# Patient Record
Sex: Female | Born: 1975
Health system: Southern US, Community
[De-identification: ages and names within clinical notes are randomized; demographics above are authoritative.]

## PROBLEM LIST (undated history)

## (undated) DIAGNOSIS — Z90710 Acquired absence of both cervix and uterus: Secondary | ICD-10-CM

## (undated) DIAGNOSIS — M1990 Unspecified osteoarthritis, unspecified site: Secondary | ICD-10-CM

## (undated) DIAGNOSIS — J189 Pneumonia, unspecified organism: Secondary | ICD-10-CM

## (undated) DIAGNOSIS — M549 Dorsalgia, unspecified: Secondary | ICD-10-CM

## (undated) DIAGNOSIS — K760 Fatty (change of) liver, not elsewhere classified: Secondary | ICD-10-CM

## (undated) DIAGNOSIS — G4700 Insomnia, unspecified: Secondary | ICD-10-CM

## (undated) DIAGNOSIS — L509 Urticaria, unspecified: Secondary | ICD-10-CM

## (undated) DIAGNOSIS — T783XXA Angioneurotic edema, initial encounter: Secondary | ICD-10-CM

## (undated) DIAGNOSIS — K219 Gastro-esophageal reflux disease without esophagitis: Secondary | ICD-10-CM

## (undated) DIAGNOSIS — M810 Age-related osteoporosis without current pathological fracture: Secondary | ICD-10-CM

## (undated) DIAGNOSIS — D649 Anemia, unspecified: Secondary | ICD-10-CM

## (undated) DIAGNOSIS — E739 Lactose intolerance, unspecified: Secondary | ICD-10-CM

## (undated) DIAGNOSIS — M2550 Pain in unspecified joint: Secondary | ICD-10-CM

## (undated) DIAGNOSIS — F419 Anxiety disorder, unspecified: Secondary | ICD-10-CM

## (undated) DIAGNOSIS — E559 Vitamin D deficiency, unspecified: Secondary | ICD-10-CM

## (undated) DIAGNOSIS — Z8742 Personal history of other diseases of the female genital tract: Secondary | ICD-10-CM

## (undated) DIAGNOSIS — N62 Hypertrophy of breast: Secondary | ICD-10-CM

## (undated) DIAGNOSIS — J45909 Unspecified asthma, uncomplicated: Secondary | ICD-10-CM

## (undated) DIAGNOSIS — G43909 Migraine, unspecified, not intractable, without status migrainosus: Secondary | ICD-10-CM

## (undated) DIAGNOSIS — T7840XA Allergy, unspecified, initial encounter: Secondary | ICD-10-CM

## (undated) DIAGNOSIS — R7303 Prediabetes: Secondary | ICD-10-CM

## (undated) HISTORY — DX: Fatty (change of) liver, not elsewhere classified: K76.0

## (undated) HISTORY — DX: Insomnia, unspecified: G47.00

## (undated) HISTORY — DX: Vitamin D deficiency, unspecified: E55.9

## (undated) HISTORY — DX: Dorsalgia, unspecified: M54.9

## (undated) HISTORY — DX: Angioneurotic edema, initial encounter: T78.3XXA

## (undated) HISTORY — DX: Unspecified osteoarthritis, unspecified site: M19.90

## (undated) HISTORY — DX: Unspecified asthma, uncomplicated: J45.909

## (undated) HISTORY — DX: Anxiety disorder, unspecified: F41.9

## (undated) HISTORY — DX: Acquired absence of both cervix and uterus: Z90.710

## (undated) HISTORY — DX: Age-related osteoporosis without current pathological fracture: M81.0

## (undated) HISTORY — DX: Allergy, unspecified, initial encounter: T78.40XA

## (undated) HISTORY — PX: UPPER GASTROINTESTINAL ENDOSCOPY: SHX188

## (undated) HISTORY — PX: RIGHT OOPHORECTOMY: SHX2359

## (undated) HISTORY — DX: Lactose intolerance, unspecified: E73.9

## (undated) HISTORY — PX: CHOLECYSTECTOMY: SHX55

## (undated) HISTORY — DX: Pain in unspecified joint: M25.50

## (undated) HISTORY — DX: Urticaria, unspecified: L50.9

## (undated) HISTORY — PX: TONSILLECTOMY: SUR1361

---

## 1999-09-04 ENCOUNTER — Emergency Department (HOSPITAL_COMMUNITY): Admission: EM | Admit: 1999-09-04 | Discharge: 1999-09-04 | Payer: Self-pay | Admitting: Emergency Medicine

## 2000-01-14 ENCOUNTER — Emergency Department (HOSPITAL_COMMUNITY): Admission: EM | Admit: 2000-01-14 | Discharge: 2000-01-14 | Payer: Self-pay

## 2000-02-15 ENCOUNTER — Emergency Department (HOSPITAL_COMMUNITY): Admission: EM | Admit: 2000-02-15 | Discharge: 2000-02-15 | Payer: Self-pay

## 2000-05-13 ENCOUNTER — Emergency Department (HOSPITAL_COMMUNITY): Admission: EM | Admit: 2000-05-13 | Discharge: 2000-05-13 | Payer: Self-pay | Admitting: Emergency Medicine

## 2000-07-10 ENCOUNTER — Inpatient Hospital Stay (HOSPITAL_COMMUNITY): Admission: AD | Admit: 2000-07-10 | Discharge: 2000-07-10 | Payer: Self-pay | Admitting: *Deleted

## 2000-07-14 ENCOUNTER — Ambulatory Visit (HOSPITAL_COMMUNITY): Admission: RE | Admit: 2000-07-14 | Discharge: 2000-07-14 | Payer: Self-pay | Admitting: *Deleted

## 2000-07-14 ENCOUNTER — Encounter: Payer: Self-pay | Admitting: *Deleted

## 2000-07-21 ENCOUNTER — Emergency Department (HOSPITAL_COMMUNITY): Admission: EM | Admit: 2000-07-21 | Discharge: 2000-07-21 | Payer: Self-pay | Admitting: Emergency Medicine

## 2000-09-26 ENCOUNTER — Ambulatory Visit (HOSPITAL_COMMUNITY): Admission: RE | Admit: 2000-09-26 | Discharge: 2000-09-26 | Payer: Self-pay | Admitting: *Deleted

## 2000-09-26 ENCOUNTER — Encounter: Payer: Self-pay | Admitting: *Deleted

## 2000-11-17 ENCOUNTER — Inpatient Hospital Stay (HOSPITAL_COMMUNITY): Admission: AD | Admit: 2000-11-17 | Discharge: 2000-11-17 | Payer: Self-pay | Admitting: Obstetrics and Gynecology

## 2000-12-29 ENCOUNTER — Encounter (INDEPENDENT_AMBULATORY_CARE_PROVIDER_SITE_OTHER): Payer: Self-pay

## 2000-12-29 ENCOUNTER — Inpatient Hospital Stay (HOSPITAL_COMMUNITY): Admission: AD | Admit: 2000-12-29 | Discharge: 2001-01-01 | Payer: Self-pay | Admitting: Obstetrics and Gynecology

## 2000-12-29 HISTORY — PX: TUBAL LIGATION: SHX77

## 2001-07-16 ENCOUNTER — Other Ambulatory Visit: Admission: RE | Admit: 2001-07-16 | Discharge: 2001-07-16 | Payer: Self-pay | Admitting: Obstetrics and Gynecology

## 2001-08-14 ENCOUNTER — Ambulatory Visit (HOSPITAL_BASED_OUTPATIENT_CLINIC_OR_DEPARTMENT_OTHER): Admission: RE | Admit: 2001-08-14 | Discharge: 2001-08-14 | Payer: Self-pay | Admitting: Family Medicine

## 2001-08-16 ENCOUNTER — Emergency Department (HOSPITAL_COMMUNITY): Admission: EM | Admit: 2001-08-16 | Discharge: 2001-08-16 | Payer: Self-pay | Admitting: Emergency Medicine

## 2001-10-27 ENCOUNTER — Ambulatory Visit (HOSPITAL_COMMUNITY): Admission: RE | Admit: 2001-10-27 | Discharge: 2001-10-27 | Payer: Self-pay | Admitting: Family Medicine

## 2002-01-28 ENCOUNTER — Encounter: Payer: Self-pay | Admitting: Family Medicine

## 2002-01-28 ENCOUNTER — Ambulatory Visit (HOSPITAL_COMMUNITY): Admission: RE | Admit: 2002-01-28 | Discharge: 2002-01-28 | Payer: Self-pay | Admitting: Family Medicine

## 2002-02-18 ENCOUNTER — Emergency Department (HOSPITAL_COMMUNITY): Admission: EM | Admit: 2002-02-18 | Discharge: 2002-02-19 | Payer: Self-pay | Admitting: Emergency Medicine

## 2002-03-29 ENCOUNTER — Emergency Department (HOSPITAL_COMMUNITY): Admission: EM | Admit: 2002-03-29 | Discharge: 2002-03-29 | Payer: Self-pay | Admitting: Emergency Medicine

## 2002-07-03 ENCOUNTER — Encounter: Payer: Self-pay | Admitting: Emergency Medicine

## 2002-07-03 ENCOUNTER — Emergency Department (HOSPITAL_COMMUNITY): Admission: EM | Admit: 2002-07-03 | Discharge: 2002-07-03 | Payer: Self-pay | Admitting: Emergency Medicine

## 2002-11-02 ENCOUNTER — Other Ambulatory Visit: Admission: RE | Admit: 2002-11-02 | Discharge: 2002-11-02 | Payer: Self-pay | Admitting: Internal Medicine

## 2002-12-28 ENCOUNTER — Emergency Department (HOSPITAL_COMMUNITY): Admission: EM | Admit: 2002-12-28 | Discharge: 2002-12-28 | Payer: Self-pay | Admitting: Emergency Medicine

## 2003-01-04 ENCOUNTER — Encounter: Admission: RE | Admit: 2003-01-04 | Discharge: 2003-01-04 | Payer: Self-pay | Admitting: Obstetrics and Gynecology

## 2003-01-07 ENCOUNTER — Ambulatory Visit (HOSPITAL_COMMUNITY): Admission: RE | Admit: 2003-01-07 | Discharge: 2003-01-07 | Payer: Self-pay | Admitting: Obstetrics and Gynecology

## 2003-01-07 ENCOUNTER — Encounter: Payer: Self-pay | Admitting: Obstetrics and Gynecology

## 2003-02-17 ENCOUNTER — Ambulatory Visit (HOSPITAL_COMMUNITY): Admission: RE | Admit: 2003-02-17 | Discharge: 2003-02-17 | Payer: Self-pay | Admitting: Internal Medicine

## 2003-02-18 ENCOUNTER — Emergency Department (HOSPITAL_COMMUNITY): Admission: EM | Admit: 2003-02-18 | Discharge: 2003-02-18 | Payer: Self-pay | Admitting: Emergency Medicine

## 2003-02-25 ENCOUNTER — Encounter: Payer: Self-pay | Admitting: Family Medicine

## 2003-02-25 ENCOUNTER — Ambulatory Visit (HOSPITAL_COMMUNITY): Admission: RE | Admit: 2003-02-25 | Discharge: 2003-02-25 | Payer: Self-pay | Admitting: Family Medicine

## 2003-03-08 ENCOUNTER — Encounter: Admission: RE | Admit: 2003-03-08 | Discharge: 2003-03-08 | Payer: Self-pay | Admitting: Obstetrics and Gynecology

## 2003-05-10 ENCOUNTER — Encounter: Admission: RE | Admit: 2003-05-10 | Discharge: 2003-05-10 | Payer: Self-pay | Admitting: Obstetrics and Gynecology

## 2003-06-13 ENCOUNTER — Encounter (INDEPENDENT_AMBULATORY_CARE_PROVIDER_SITE_OTHER): Payer: Self-pay | Admitting: *Deleted

## 2003-06-13 ENCOUNTER — Ambulatory Visit (HOSPITAL_COMMUNITY): Admission: RE | Admit: 2003-06-13 | Discharge: 2003-06-13 | Payer: Self-pay | Admitting: Obstetrics and Gynecology

## 2003-06-15 ENCOUNTER — Inpatient Hospital Stay (HOSPITAL_COMMUNITY): Admission: AD | Admit: 2003-06-15 | Discharge: 2003-06-15 | Payer: Self-pay | Admitting: Obstetrics and Gynecology

## 2003-06-19 ENCOUNTER — Inpatient Hospital Stay (HOSPITAL_COMMUNITY): Admission: AD | Admit: 2003-06-19 | Discharge: 2003-06-19 | Payer: Self-pay | Admitting: *Deleted

## 2003-06-20 ENCOUNTER — Inpatient Hospital Stay (HOSPITAL_COMMUNITY): Admission: AD | Admit: 2003-06-20 | Discharge: 2003-06-20 | Payer: Self-pay | Admitting: Obstetrics and Gynecology

## 2003-06-22 ENCOUNTER — Emergency Department (HOSPITAL_COMMUNITY): Admission: EM | Admit: 2003-06-22 | Discharge: 2003-06-22 | Payer: Self-pay | Admitting: Emergency Medicine

## 2003-06-29 ENCOUNTER — Inpatient Hospital Stay (HOSPITAL_COMMUNITY): Admission: AD | Admit: 2003-06-29 | Discharge: 2003-06-29 | Payer: Self-pay | Admitting: Obstetrics and Gynecology

## 2003-07-06 ENCOUNTER — Inpatient Hospital Stay (HOSPITAL_COMMUNITY): Admission: AD | Admit: 2003-07-06 | Discharge: 2003-07-06 | Payer: Self-pay | Admitting: Obstetrics and Gynecology

## 2003-07-08 HISTORY — PX: DILATION AND CURETTAGE OF UTERUS: SHX78

## 2003-07-21 ENCOUNTER — Encounter: Admission: RE | Admit: 2003-07-21 | Discharge: 2003-07-21 | Payer: Self-pay | Admitting: Obstetrics and Gynecology

## 2003-11-18 ENCOUNTER — Ambulatory Visit: Admission: RE | Admit: 2003-11-18 | Discharge: 2003-11-18 | Payer: Self-pay | Admitting: Obstetrics and Gynecology

## 2003-12-12 ENCOUNTER — Ambulatory Visit: Payer: Self-pay | Admitting: Obstetrics and Gynecology

## 2003-12-12 ENCOUNTER — Inpatient Hospital Stay (HOSPITAL_COMMUNITY): Admission: RE | Admit: 2003-12-12 | Discharge: 2003-12-15 | Payer: Self-pay | Admitting: Obstetrics and Gynecology

## 2003-12-12 ENCOUNTER — Encounter (INDEPENDENT_AMBULATORY_CARE_PROVIDER_SITE_OTHER): Payer: Self-pay | Admitting: Specialist

## 2003-12-12 HISTORY — PX: ABDOMINAL HYSTERECTOMY: SHX81

## 2003-12-16 ENCOUNTER — Inpatient Hospital Stay (HOSPITAL_COMMUNITY): Admission: AD | Admit: 2003-12-16 | Discharge: 2003-12-19 | Payer: Self-pay | Admitting: Obstetrics and Gynecology

## 2003-12-16 ENCOUNTER — Ambulatory Visit: Payer: Self-pay | Admitting: Obstetrics and Gynecology

## 2003-12-26 ENCOUNTER — Inpatient Hospital Stay (HOSPITAL_COMMUNITY): Admission: AD | Admit: 2003-12-26 | Discharge: 2003-12-26 | Payer: Self-pay | Admitting: Obstetrics and Gynecology

## 2003-12-26 ENCOUNTER — Ambulatory Visit: Payer: Self-pay | Admitting: Obstetrics and Gynecology

## 2004-01-02 ENCOUNTER — Inpatient Hospital Stay (HOSPITAL_COMMUNITY): Admission: AD | Admit: 2004-01-02 | Discharge: 2004-01-02 | Payer: Self-pay | Admitting: Obstetrics and Gynecology

## 2004-01-02 ENCOUNTER — Ambulatory Visit: Payer: Self-pay | Admitting: Obstetrics and Gynecology

## 2004-01-06 ENCOUNTER — Ambulatory Visit: Payer: Self-pay | Admitting: Family Medicine

## 2004-01-09 ENCOUNTER — Ambulatory Visit: Payer: Self-pay | Admitting: Family Medicine

## 2004-01-09 ENCOUNTER — Inpatient Hospital Stay (HOSPITAL_COMMUNITY): Admission: AD | Admit: 2004-01-09 | Discharge: 2004-01-09 | Payer: Self-pay | Admitting: *Deleted

## 2004-01-11 ENCOUNTER — Ambulatory Visit (HOSPITAL_COMMUNITY): Payer: Self-pay | Admitting: Professional Counselor

## 2004-01-16 ENCOUNTER — Inpatient Hospital Stay (HOSPITAL_COMMUNITY): Admission: AD | Admit: 2004-01-16 | Discharge: 2004-01-16 | Payer: Self-pay | Admitting: Obstetrics and Gynecology

## 2004-01-23 ENCOUNTER — Ambulatory Visit (HOSPITAL_COMMUNITY): Payer: Self-pay | Admitting: Professional Counselor

## 2004-01-23 ENCOUNTER — Inpatient Hospital Stay (HOSPITAL_COMMUNITY): Admission: AD | Admit: 2004-01-23 | Discharge: 2004-01-23 | Payer: Self-pay | Admitting: Obstetrics and Gynecology

## 2004-01-24 ENCOUNTER — Ambulatory Visit: Payer: Self-pay | Admitting: Obstetrics and Gynecology

## 2004-01-26 ENCOUNTER — Ambulatory Visit: Payer: Self-pay | Admitting: Family Medicine

## 2004-01-27 ENCOUNTER — Ambulatory Visit (HOSPITAL_COMMUNITY): Payer: Self-pay | Admitting: Professional Counselor

## 2004-02-07 ENCOUNTER — Ambulatory Visit: Payer: Self-pay | Admitting: Obstetrics & Gynecology

## 2004-02-13 ENCOUNTER — Ambulatory Visit (HOSPITAL_COMMUNITY): Payer: Self-pay | Admitting: Psychiatry

## 2004-02-29 ENCOUNTER — Ambulatory Visit (HOSPITAL_COMMUNITY): Payer: Self-pay | Admitting: Psychiatry

## 2004-03-16 ENCOUNTER — Ambulatory Visit (HOSPITAL_COMMUNITY): Payer: Self-pay | Admitting: Professional Counselor

## 2004-03-28 ENCOUNTER — Ambulatory Visit (HOSPITAL_COMMUNITY): Payer: Self-pay | Admitting: Professional Counselor

## 2004-03-28 ENCOUNTER — Ambulatory Visit (HOSPITAL_COMMUNITY): Payer: Self-pay | Admitting: Psychiatry

## 2004-04-11 ENCOUNTER — Ambulatory Visit (HOSPITAL_COMMUNITY): Payer: Self-pay | Admitting: Psychiatry

## 2004-04-16 ENCOUNTER — Ambulatory Visit: Payer: Self-pay | Admitting: Family Medicine

## 2004-04-17 ENCOUNTER — Ambulatory Visit (HOSPITAL_COMMUNITY): Admission: RE | Admit: 2004-04-17 | Discharge: 2004-04-17 | Payer: Self-pay | Admitting: Family Medicine

## 2004-05-15 ENCOUNTER — Ambulatory Visit (HOSPITAL_COMMUNITY): Payer: Self-pay | Admitting: Professional Counselor

## 2004-05-17 ENCOUNTER — Ambulatory Visit (HOSPITAL_COMMUNITY): Payer: Self-pay | Admitting: Professional Counselor

## 2004-05-21 ENCOUNTER — Ambulatory Visit (HOSPITAL_COMMUNITY): Payer: Self-pay | Admitting: Psychiatry

## 2004-06-04 ENCOUNTER — Ambulatory Visit: Payer: Self-pay | Admitting: Family Medicine

## 2004-06-15 ENCOUNTER — Ambulatory Visit: Payer: Self-pay | Admitting: Family Medicine

## 2004-07-23 ENCOUNTER — Ambulatory Visit: Payer: Self-pay | Admitting: Family Medicine

## 2004-07-25 ENCOUNTER — Ambulatory Visit (HOSPITAL_COMMUNITY): Admission: RE | Admit: 2004-07-25 | Discharge: 2004-07-25 | Payer: Self-pay | Admitting: Family Medicine

## 2004-07-31 ENCOUNTER — Ambulatory Visit: Payer: Self-pay | Admitting: Family Medicine

## 2004-08-27 ENCOUNTER — Ambulatory Visit: Payer: Self-pay | Admitting: Family Medicine

## 2004-11-20 ENCOUNTER — Ambulatory Visit: Payer: Self-pay | Admitting: Family Medicine

## 2004-11-20 ENCOUNTER — Ambulatory Visit (HOSPITAL_COMMUNITY): Admission: RE | Admit: 2004-11-20 | Discharge: 2004-11-20 | Payer: Self-pay | Admitting: Family Medicine

## 2004-11-30 ENCOUNTER — Ambulatory Visit: Payer: Self-pay | Admitting: Family Medicine

## 2004-12-04 ENCOUNTER — Ambulatory Visit: Payer: Self-pay | Admitting: Obstetrics & Gynecology

## 2004-12-21 ENCOUNTER — Encounter: Admission: RE | Admit: 2004-12-21 | Discharge: 2005-01-18 | Payer: Self-pay | Admitting: Family Medicine

## 2005-01-10 ENCOUNTER — Ambulatory Visit: Payer: Self-pay | Admitting: Family Medicine

## 2005-01-17 ENCOUNTER — Inpatient Hospital Stay (HOSPITAL_COMMUNITY): Admission: RE | Admit: 2005-01-17 | Discharge: 2005-01-19 | Payer: Self-pay | Admitting: Psychiatry

## 2005-01-17 ENCOUNTER — Ambulatory Visit: Payer: Self-pay | Admitting: Psychiatry

## 2005-01-31 ENCOUNTER — Ambulatory Visit: Payer: Self-pay | Admitting: Family Medicine

## 2005-06-14 ENCOUNTER — Ambulatory Visit: Payer: Self-pay | Admitting: Family Medicine

## 2005-07-02 ENCOUNTER — Ambulatory Visit (HOSPITAL_COMMUNITY): Admission: RE | Admit: 2005-07-02 | Discharge: 2005-07-02 | Payer: Self-pay | Admitting: Family Medicine

## 2005-07-03 ENCOUNTER — Ambulatory Visit (HOSPITAL_COMMUNITY): Admission: RE | Admit: 2005-07-03 | Discharge: 2005-07-03 | Payer: Self-pay | Admitting: Family Medicine

## 2005-07-05 ENCOUNTER — Ambulatory Visit: Payer: Self-pay | Admitting: Family Medicine

## 2005-07-11 ENCOUNTER — Ambulatory Visit (HOSPITAL_COMMUNITY): Admission: RE | Admit: 2005-07-11 | Discharge: 2005-07-11 | Payer: Self-pay | Admitting: Family Medicine

## 2005-08-01 ENCOUNTER — Ambulatory Visit: Payer: Self-pay | Admitting: Family Medicine

## 2006-02-14 ENCOUNTER — Ambulatory Visit: Payer: Self-pay | Admitting: Family Medicine

## 2006-02-17 ENCOUNTER — Ambulatory Visit (HOSPITAL_COMMUNITY): Admission: RE | Admit: 2006-02-17 | Discharge: 2006-02-17 | Payer: Self-pay | Admitting: Family Medicine

## 2006-02-19 ENCOUNTER — Ambulatory Visit: Payer: Self-pay | Admitting: *Deleted

## 2006-06-27 ENCOUNTER — Ambulatory Visit: Payer: Self-pay | Admitting: Family Medicine

## 2006-07-02 ENCOUNTER — Ambulatory Visit: Payer: Self-pay | Admitting: Family Medicine

## 2006-07-03 ENCOUNTER — Emergency Department (HOSPITAL_COMMUNITY): Admission: EM | Admit: 2006-07-03 | Discharge: 2006-07-03 | Payer: Self-pay | Admitting: Emergency Medicine

## 2006-07-25 ENCOUNTER — Emergency Department (HOSPITAL_COMMUNITY): Admission: EM | Admit: 2006-07-25 | Discharge: 2006-07-25 | Payer: Self-pay | Admitting: Emergency Medicine

## 2006-07-26 ENCOUNTER — Inpatient Hospital Stay (HOSPITAL_COMMUNITY): Admission: EM | Admit: 2006-07-26 | Discharge: 2006-07-29 | Payer: Self-pay | Admitting: Emergency Medicine

## 2006-08-08 ENCOUNTER — Ambulatory Visit: Payer: Self-pay | Admitting: Family Medicine

## 2006-08-28 ENCOUNTER — Ambulatory Visit: Payer: Self-pay | Admitting: Family Medicine

## 2006-09-30 DIAGNOSIS — IMO0002 Reserved for concepts with insufficient information to code with codable children: Secondary | ICD-10-CM | POA: Insufficient documentation

## 2006-09-30 DIAGNOSIS — F519 Sleep disorder not due to a substance or known physiological condition, unspecified: Secondary | ICD-10-CM | POA: Insufficient documentation

## 2006-09-30 DIAGNOSIS — Z8659 Personal history of other mental and behavioral disorders: Secondary | ICD-10-CM | POA: Insufficient documentation

## 2006-09-30 DIAGNOSIS — K5900 Constipation, unspecified: Secondary | ICD-10-CM | POA: Insufficient documentation

## 2006-09-30 DIAGNOSIS — A6000 Herpesviral infection of urogenital system, unspecified: Secondary | ICD-10-CM | POA: Insufficient documentation

## 2006-09-30 DIAGNOSIS — E669 Obesity, unspecified: Secondary | ICD-10-CM | POA: Insufficient documentation

## 2006-10-15 DIAGNOSIS — D509 Iron deficiency anemia, unspecified: Secondary | ICD-10-CM | POA: Insufficient documentation

## 2007-01-21 ENCOUNTER — Encounter (INDEPENDENT_AMBULATORY_CARE_PROVIDER_SITE_OTHER): Payer: Self-pay | Admitting: *Deleted

## 2007-04-24 ENCOUNTER — Ambulatory Visit: Payer: Self-pay | Admitting: Family Medicine

## 2007-04-24 LAB — CONVERTED CEMR LAB
ALT: 10 units/L (ref 0–35)
AST: 14 units/L (ref 0–37)
Albumin: 4.3 g/dL (ref 3.5–5.2)
Alkaline Phosphatase: 52 units/L (ref 39–117)
BUN: 6 mg/dL (ref 6–23)
Basophils Absolute: 0 10*3/uL (ref 0.0–0.1)
Basophils Relative: 0 % (ref 0–1)
CO2: 25 meq/L (ref 19–32)
Calcium: 9.4 mg/dL (ref 8.4–10.5)
Chloride: 103 meq/L (ref 96–112)
Creatinine, Ser: 0.72 mg/dL (ref 0.40–1.20)
Eosinophils Absolute: 0.1 10*3/uL — ABNORMAL LOW (ref 0.2–0.7)
Eosinophils Relative: 1 % (ref 0–5)
Free T4: 1.35 ng/dL (ref 0.89–1.80)
Glucose, Bld: 85 mg/dL (ref 70–99)
HCT: 35.6 % — ABNORMAL LOW (ref 36.0–46.0)
Hemoglobin: 11.1 g/dL — ABNORMAL LOW (ref 12.0–15.0)
Lymphocytes Relative: 39 % (ref 12–46)
Lymphs Abs: 2.8 10*3/uL (ref 0.7–4.0)
MCHC: 31.2 g/dL (ref 30.0–36.0)
MCV: 80 fL (ref 78.0–100.0)
Monocytes Absolute: 0.6 10*3/uL (ref 0.1–1.0)
Monocytes Relative: 8 % (ref 3–12)
Neutro Abs: 3.7 10*3/uL (ref 1.7–7.7)
Neutrophils Relative %: 52 % (ref 43–77)
Platelets: 307 10*3/uL (ref 150–400)
Potassium: 3.9 meq/L (ref 3.5–5.3)
RBC: 4.45 M/uL (ref 3.87–5.11)
RDW: 14 % (ref 11.5–15.5)
Sodium: 139 meq/L (ref 135–145)
TSH: 0.856 microintl units/mL (ref 0.350–5.50)
Total Bilirubin: 0.4 mg/dL (ref 0.3–1.2)
Total Protein: 7.5 g/dL (ref 6.0–8.3)
WBC: 7.1 10*3/uL (ref 4.0–10.5)

## 2007-12-29 ENCOUNTER — Ambulatory Visit (HOSPITAL_BASED_OUTPATIENT_CLINIC_OR_DEPARTMENT_OTHER): Admission: RE | Admit: 2007-12-29 | Discharge: 2007-12-29 | Payer: Self-pay | Admitting: Orthopedic Surgery

## 2007-12-29 HISTORY — PX: ANKLE ARTHROSCOPY: SUR85

## 2008-04-06 ENCOUNTER — Ambulatory Visit: Payer: Self-pay | Admitting: Obstetrics and Gynecology

## 2008-06-28 ENCOUNTER — Ambulatory Visit: Payer: Self-pay | Admitting: Surgery

## 2008-06-28 ENCOUNTER — Ambulatory Visit: Admission: RE | Admit: 2008-06-28 | Discharge: 2008-06-28 | Payer: Self-pay | Admitting: Orthopedic Surgery

## 2008-06-28 ENCOUNTER — Encounter (INDEPENDENT_AMBULATORY_CARE_PROVIDER_SITE_OTHER): Payer: Self-pay | Admitting: Orthopedic Surgery

## 2008-08-19 ENCOUNTER — Encounter: Admission: RE | Admit: 2008-08-19 | Discharge: 2008-08-19 | Payer: Self-pay | Admitting: Internal Medicine

## 2008-08-19 ENCOUNTER — Emergency Department (HOSPITAL_COMMUNITY): Admission: EM | Admit: 2008-08-19 | Discharge: 2008-08-19 | Payer: Self-pay | Admitting: Emergency Medicine

## 2008-08-23 ENCOUNTER — Emergency Department (HOSPITAL_COMMUNITY): Admission: EM | Admit: 2008-08-23 | Discharge: 2008-08-23 | Payer: Self-pay | Admitting: Emergency Medicine

## 2008-09-05 ENCOUNTER — Emergency Department (HOSPITAL_COMMUNITY): Admission: EM | Admit: 2008-09-05 | Discharge: 2008-09-05 | Payer: Self-pay | Admitting: Emergency Medicine

## 2008-09-10 ENCOUNTER — Emergency Department (HOSPITAL_COMMUNITY): Admission: EM | Admit: 2008-09-10 | Discharge: 2008-09-10 | Payer: Self-pay | Admitting: Emergency Medicine

## 2008-10-27 ENCOUNTER — Encounter: Admission: RE | Admit: 2008-10-27 | Discharge: 2008-11-01 | Payer: Self-pay | Admitting: Orthopaedic Surgery

## 2008-10-27 ENCOUNTER — Encounter: Admission: RE | Admit: 2008-10-27 | Discharge: 2008-10-27 | Payer: Self-pay | Admitting: Gastroenterology

## 2008-10-31 ENCOUNTER — Encounter: Admission: RE | Admit: 2008-10-31 | Discharge: 2008-10-31 | Payer: Self-pay | Admitting: Internal Medicine

## 2008-12-18 ENCOUNTER — Inpatient Hospital Stay (HOSPITAL_COMMUNITY): Admission: EM | Admit: 2008-12-18 | Discharge: 2008-12-18 | Payer: Self-pay | Admitting: Emergency Medicine

## 2009-01-28 ENCOUNTER — Inpatient Hospital Stay (HOSPITAL_COMMUNITY): Admission: AD | Admit: 2009-01-28 | Discharge: 2009-01-28 | Payer: Self-pay | Admitting: Obstetrics and Gynecology

## 2009-05-06 HISTORY — PX: RIGHT OOPHORECTOMY: SHX2359

## 2009-05-19 ENCOUNTER — Emergency Department (HOSPITAL_COMMUNITY): Admission: EM | Admit: 2009-05-19 | Discharge: 2009-05-19 | Payer: Self-pay | Admitting: Family Medicine

## 2009-07-10 ENCOUNTER — Encounter: Admission: RE | Admit: 2009-07-10 | Discharge: 2009-07-10 | Payer: Self-pay | Admitting: Gastroenterology

## 2009-07-11 ENCOUNTER — Encounter: Admission: RE | Admit: 2009-07-11 | Discharge: 2009-10-09 | Payer: Self-pay | Admitting: Otolaryngology

## 2009-07-15 ENCOUNTER — Inpatient Hospital Stay (HOSPITAL_COMMUNITY): Admission: AD | Admit: 2009-07-15 | Discharge: 2009-07-15 | Payer: Self-pay | Admitting: Obstetrics & Gynecology

## 2009-07-25 ENCOUNTER — Ambulatory Visit (HOSPITAL_COMMUNITY): Admission: RE | Admit: 2009-07-25 | Discharge: 2009-07-25 | Payer: Self-pay | Admitting: Gastroenterology

## 2009-08-20 ENCOUNTER — Emergency Department (HOSPITAL_COMMUNITY): Admission: EM | Admit: 2009-08-20 | Discharge: 2009-08-20 | Payer: Self-pay | Admitting: Family Medicine

## 2010-01-03 ENCOUNTER — Ambulatory Visit (HOSPITAL_BASED_OUTPATIENT_CLINIC_OR_DEPARTMENT_OTHER): Admission: RE | Admit: 2010-01-03 | Discharge: 2010-01-03 | Payer: Self-pay | Admitting: Orthopedic Surgery

## 2010-01-03 HISTORY — PX: REPAIR PERONEAL TENDONS ANKLE: SUR1201

## 2010-02-02 ENCOUNTER — Emergency Department (HOSPITAL_COMMUNITY): Admission: EM | Admit: 2010-02-02 | Discharge: 2010-02-03 | Payer: Self-pay | Admitting: Emergency Medicine

## 2010-05-06 HISTORY — PX: BREAST SURGERY: SHX581

## 2010-07-18 LAB — COMPREHENSIVE METABOLIC PANEL
ALT: 14 U/L (ref 0–35)
AST: 17 U/L (ref 0–37)
Albumin: 3.8 g/dL (ref 3.5–5.2)
Alkaline Phosphatase: 50 U/L (ref 39–117)
BUN: 4 mg/dL — ABNORMAL LOW (ref 6–23)
CO2: 29 mEq/L (ref 19–32)
Calcium: 8.7 mg/dL (ref 8.4–10.5)
Chloride: 105 mEq/L (ref 96–112)
Creatinine, Ser: 0.72 mg/dL (ref 0.4–1.2)
GFR calc Af Amer: >60 mL/min (ref >60)
GFR calc non Af Amer: >60 mL/min (ref >60)
Glucose, Bld: 111 mg/dL — ABNORMAL HIGH (ref 70–99)
Potassium: 3.7 mEq/L (ref 3.5–5.1)
Sodium: 138 mEq/L (ref 135–145)
Total Bilirubin: 0.3 mg/dL (ref 0.3–1.2)
Total Protein: 7.3 g/dL (ref 6.0–8.3)

## 2010-07-18 LAB — CBC
HCT: 32.1 % — ABNORMAL LOW (ref 36.0–46.0)
Hemoglobin: 10.5 g/dL — ABNORMAL LOW (ref 12.0–15.0)
MCH: 25.7 pg — ABNORMAL LOW (ref 26.0–34.0)
MCHC: 32.8 g/dL (ref 30.0–36.0)
MCV: 78.4 fL (ref 78.0–100.0)
Platelets: 319 10*3/uL (ref 150–400)
RBC: 4.1 MIL/uL (ref 3.87–5.11)
RDW: 13.2 % (ref 11.5–15.5)
WBC: 6.4 10*3/uL (ref 4.0–10.5)

## 2010-07-18 LAB — DIFFERENTIAL
Basophils Absolute: 0.1 10*3/uL (ref 0.0–0.1)
Basophils Relative: 2 % — ABNORMAL HIGH (ref 0–1)
Eosinophils Absolute: 0 10*3/uL (ref 0.0–0.7)
Eosinophils Relative: 1 % (ref 0–5)
Lymphocytes Relative: 40 % (ref 12–46)
Lymphs Abs: 2.5 10*3/uL (ref 0.7–4.0)
Monocytes Absolute: 0.5 10*3/uL (ref 0.1–1.0)
Monocytes Relative: 7 % (ref 3–12)
Neutro Abs: 3.2 10*3/uL (ref 1.7–7.7)
Neutrophils Relative %: 50 % (ref 43–77)

## 2010-07-18 LAB — LIPASE, BLOOD: Lipase: 38 U/L (ref 11–59)

## 2010-07-19 LAB — URINALYSIS, ROUTINE W REFLEX MICROSCOPIC
Bilirubin Urine: NEGATIVE
Glucose, UA: NEGATIVE mg/dL
Hgb urine dipstick: NEGATIVE
Nitrite: NEGATIVE
Protein, ur: NEGATIVE mg/dL
Specific Gravity, Urine: 1.016 (ref 1.005–1.030)
Urobilinogen, UA: 0.2 mg/dL (ref 0.0–1.0)
pH: 6 (ref 5.0–8.0)

## 2010-07-19 LAB — HEMOCCULT GUIAC POC 1CARD (OFFICE): Fecal Occult Bld: NEGATIVE

## 2010-07-19 LAB — PREGNANCY, URINE: Preg Test, Ur: NEGATIVE

## 2010-07-22 LAB — POCT URINALYSIS DIP (DEVICE)
Bilirubin Urine: NEGATIVE
Glucose, UA: NEGATIVE mg/dL
Hgb urine dipstick: NEGATIVE
Ketones, ur: NEGATIVE mg/dL
Nitrite: NEGATIVE
Protein, ur: NEGATIVE mg/dL
Specific Gravity, Urine: <=1.005 (ref 1.005–1.030)
Urobilinogen, UA: 0.2 mg/dL (ref 0.0–1.0)
pH: 6.5 (ref 5.0–8.0)

## 2010-07-27 LAB — URINALYSIS, ROUTINE W REFLEX MICROSCOPIC
Bilirubin Urine: NEGATIVE
Glucose, UA: NEGATIVE mg/dL
Hgb urine dipstick: NEGATIVE
Ketones, ur: NEGATIVE mg/dL
Nitrite: NEGATIVE
Protein, ur: NEGATIVE mg/dL
Specific Gravity, Urine: <1.005 — ABNORMAL LOW (ref 1.005–1.030)
Urobilinogen, UA: 0.2 mg/dL (ref 0.0–1.0)
pH: 6 (ref 5.0–8.0)

## 2010-07-27 LAB — DIFFERENTIAL
Basophils Absolute: 0.1 10*3/uL (ref 0.0–0.1)
Basophils Relative: 1 % (ref 0–1)
Eosinophils Absolute: 0.1 10*3/uL (ref 0.0–0.7)
Eosinophils Relative: 1 % (ref 0–5)
Lymphocytes Relative: 42 % (ref 12–46)
Lymphs Abs: 3.3 10*3/uL (ref 0.7–4.0)
Monocytes Absolute: 0.6 10*3/uL (ref 0.1–1.0)
Monocytes Relative: 8 % (ref 3–12)
Neutro Abs: 3.7 10*3/uL (ref 1.7–7.7)
Neutrophils Relative %: 47 % (ref 43–77)

## 2010-07-27 LAB — CBC
HCT: 32.9 % — ABNORMAL LOW (ref 36.0–46.0)
Hemoglobin: 10.7 g/dL — ABNORMAL LOW (ref 12.0–15.0)
MCHC: 32.4 g/dL (ref 30.0–36.0)
MCV: 79.1 fL (ref 78.0–100.0)
Platelets: 290 10*3/uL (ref 150–400)
RBC: 4.17 MIL/uL (ref 3.87–5.11)
RDW: 13.8 % (ref 11.5–15.5)
WBC: 7.7 10*3/uL (ref 4.0–10.5)

## 2010-08-10 LAB — URINALYSIS, ROUTINE W REFLEX MICROSCOPIC
Bilirubin Urine: NEGATIVE
Glucose, UA: NEGATIVE mg/dL
Hgb urine dipstick: NEGATIVE
Ketones, ur: NEGATIVE mg/dL
Nitrite: NEGATIVE
Protein, ur: NEGATIVE mg/dL
Specific Gravity, Urine: <1.005 — ABNORMAL LOW (ref 1.005–1.030)
Urobilinogen, UA: 0.2 mg/dL (ref 0.0–1.0)
pH: 5 (ref 5.0–8.0)

## 2010-08-10 LAB — DIFFERENTIAL
Basophils Absolute: 0.1 10*3/uL (ref 0.0–0.1)
Basophils Relative: 1 % (ref 0–1)
Eosinophils Absolute: 0.3 10*3/uL (ref 0.0–0.7)
Eosinophils Relative: 5 % (ref 0–5)
Lymphocytes Relative: 31 % (ref 12–46)
Lymphs Abs: 1.9 10*3/uL (ref 0.7–4.0)
Monocytes Absolute: 0.5 10*3/uL (ref 0.1–1.0)
Monocytes Relative: 8 % (ref 3–12)
Neutro Abs: 3.3 10*3/uL (ref 1.7–7.7)
Neutrophils Relative %: 55 % (ref 43–77)

## 2010-08-10 LAB — CBC
HCT: 26.1 % — ABNORMAL LOW (ref 36.0–46.0)
Hemoglobin: 8.8 g/dL — ABNORMAL LOW (ref 12.0–15.0)
MCHC: 33.7 g/dL (ref 30.0–36.0)
MCV: 78.3 fL (ref 78.0–100.0)
Platelets: 244 10*3/uL (ref 150–400)
RBC: 3.34 MIL/uL — ABNORMAL LOW (ref 3.87–5.11)
RDW: 13.4 % (ref 11.5–15.5)
WBC: 6 10*3/uL (ref 4.0–10.5)

## 2010-08-11 LAB — URINALYSIS, ROUTINE W REFLEX MICROSCOPIC
Bilirubin Urine: NEGATIVE
Glucose, UA: NEGATIVE mg/dL
Hgb urine dipstick: NEGATIVE
Ketones, ur: NEGATIVE mg/dL
Nitrite: NEGATIVE
Protein, ur: NEGATIVE mg/dL
Specific Gravity, Urine: 1.001 — ABNORMAL LOW (ref 1.005–1.030)
Urobilinogen, UA: 0.2 mg/dL (ref 0.0–1.0)
pH: 6.5 (ref 5.0–8.0)

## 2010-08-11 LAB — IRON AND TIBC
Iron: 65 ug/dL (ref 42–135)
Saturation Ratios: 24 % (ref 20–55)
TIBC: 273 ug/dL (ref 250–470)
UIBC: 208 ug/dL

## 2010-08-11 LAB — COMPREHENSIVE METABOLIC PANEL
ALT: 19 U/L (ref 0–35)
AST: 28 U/L (ref 0–37)
Albumin: 4 g/dL (ref 3.5–5.2)
Alkaline Phosphatase: 49 U/L (ref 39–117)
BUN: 3 mg/dL — ABNORMAL LOW (ref 6–23)
CO2: 29 mEq/L (ref 19–32)
Calcium: 9.1 mg/dL (ref 8.4–10.5)
Chloride: 103 mEq/L (ref 96–112)
Creatinine, Ser: 0.68 mg/dL (ref 0.4–1.2)
GFR calc Af Amer: >60 mL/min (ref >60)
GFR calc non Af Amer: >60 mL/min (ref >60)
Glucose, Bld: 77 mg/dL (ref 70–99)
Potassium: 3.8 mEq/L (ref 3.5–5.1)
Sodium: 139 mEq/L (ref 135–145)
Total Bilirubin: 0.5 mg/dL (ref 0.3–1.2)
Total Protein: 7.2 g/dL (ref 6.0–8.3)

## 2010-08-11 LAB — CBC
HCT: 29.7 % — ABNORMAL LOW (ref 36.0–46.0)
HCT: 32.5 % — ABNORMAL LOW (ref 36.0–46.0)
Hemoglobin: 10.5 g/dL — ABNORMAL LOW (ref 12.0–15.0)
Hemoglobin: 9.3 g/dL — ABNORMAL LOW (ref 12.0–15.0)
MCHC: 31.4 g/dL (ref 30.0–36.0)
MCHC: 32.2 g/dL (ref 30.0–36.0)
MCV: 79 fL (ref 78.0–100.0)
MCV: 79.6 fL (ref 78.0–100.0)
Platelets: 292 10*3/uL (ref 150–400)
Platelets: 304 10*3/uL (ref 150–400)
RBC: 3.73 MIL/uL — ABNORMAL LOW (ref 3.87–5.11)
RBC: 4.12 MIL/uL (ref 3.87–5.11)
RDW: 13.8 % (ref 11.5–15.5)
RDW: 14.1 % (ref 11.5–15.5)
WBC: 5.8 10*3/uL (ref 4.0–10.5)
WBC: 6.2 10*3/uL (ref 4.0–10.5)

## 2010-08-11 LAB — BASIC METABOLIC PANEL
BUN: 3 mg/dL — ABNORMAL LOW (ref 6–23)
CO2: 29 mEq/L (ref 19–32)
Calcium: 8.4 mg/dL (ref 8.4–10.5)
Chloride: 105 mEq/L (ref 96–112)
Creatinine, Ser: 0.73 mg/dL (ref 0.4–1.2)
GFR calc Af Amer: >60 mL/min (ref >60)
GFR calc non Af Amer: >60 mL/min (ref >60)
Glucose, Bld: 92 mg/dL (ref 70–99)
Potassium: 3.5 mEq/L (ref 3.5–5.1)
Sodium: 139 mEq/L (ref 135–145)

## 2010-08-11 LAB — HEPATIC FUNCTION PANEL
ALT: 17 U/L (ref 0–35)
AST: 25 U/L (ref 0–37)
Albumin: 3.3 g/dL — ABNORMAL LOW (ref 3.5–5.2)
Alkaline Phosphatase: 41 U/L (ref 39–117)
Bilirubin, Direct: <0.1 mg/dL (ref 0.0–0.3)
Total Bilirubin: 0.2 mg/dL — ABNORMAL LOW (ref 0.3–1.2)
Total Protein: 6.2 g/dL (ref 6.0–8.3)

## 2010-08-11 LAB — DIFFERENTIAL
Basophils Absolute: 0.1 10*3/uL (ref 0.0–0.1)
Basophils Relative: 1 % (ref 0–1)
Eosinophils Absolute: 0 10*3/uL (ref 0.0–0.7)
Eosinophils Relative: 1 % (ref 0–5)
Lymphocytes Relative: 41 % (ref 12–46)
Lymphs Abs: 2.4 10*3/uL (ref 0.7–4.0)
Monocytes Absolute: 0.4 10*3/uL (ref 0.1–1.0)
Monocytes Relative: 7 % (ref 3–12)
Neutro Abs: 2.9 10*3/uL (ref 1.7–7.7)
Neutrophils Relative %: 50 % (ref 43–77)

## 2010-08-11 LAB — LIPASE, BLOOD
Lipase: 18 U/L (ref 11–59)
Lipase: 18 U/L (ref 11–59)

## 2010-08-11 LAB — VITAMIN B12: Vitamin B-12: 818 pg/mL (ref 211–911)

## 2010-08-11 LAB — FERRITIN: Ferritin: 37 ng/mL (ref 10–291)

## 2010-08-11 LAB — FOLATE: Folate: >20 ng/mL

## 2010-08-11 LAB — POCT PREGNANCY, URINE: Preg Test, Ur: NEGATIVE

## 2010-08-14 LAB — POCT I-STAT, CHEM 8
BUN: 4 mg/dL — ABNORMAL LOW (ref 6–23)
Calcium, Ion: 1.19 mmol/L (ref 1.12–1.32)
Chloride: 103 mEq/L (ref 96–112)
Creatinine, Ser: 0.9 mg/dL (ref 0.4–1.2)
Glucose, Bld: 94 mg/dL (ref 70–99)
HCT: 35 % — ABNORMAL LOW (ref 36.0–46.0)
Hemoglobin: 11.9 g/dL — ABNORMAL LOW (ref 12.0–15.0)
Potassium: 4.2 mEq/L (ref 3.5–5.1)
Sodium: 140 mEq/L (ref 135–145)
TCO2: 29 mmol/L (ref 0–100)

## 2010-08-14 LAB — POCT CARDIAC MARKERS
CKMB, poc: <1 ng/mL — ABNORMAL LOW (ref 1.0–8.0)
Myoglobin, poc: 48.4 ng/mL (ref 12–200)
Troponin i, poc: <0.05 ng/mL (ref 0.00–0.09)

## 2010-08-15 LAB — URINE CULTURE: Colony Count: 5000

## 2010-08-15 LAB — URINALYSIS, ROUTINE W REFLEX MICROSCOPIC
Bilirubin Urine: NEGATIVE
Glucose, UA: NEGATIVE mg/dL
Hgb urine dipstick: NEGATIVE
Ketones, ur: NEGATIVE mg/dL
Nitrite: NEGATIVE
Protein, ur: NEGATIVE mg/dL
Specific Gravity, Urine: 1.005 (ref 1.005–1.030)
Urobilinogen, UA: 0.2 mg/dL (ref 0.0–1.0)
pH: 6 (ref 5.0–8.0)

## 2010-08-15 LAB — POCT I-STAT, CHEM 8
BUN: <3 mg/dL — ABNORMAL LOW (ref 6–23)
Calcium, Ion: 1.17 mmol/L (ref 1.12–1.32)
Chloride: 101 mEq/L (ref 96–112)
Creatinine, Ser: 0.9 mg/dL (ref 0.4–1.2)
Glucose, Bld: 81 mg/dL (ref 70–99)
HCT: 37 % (ref 36.0–46.0)
Hemoglobin: 12.6 g/dL (ref 12.0–15.0)
Potassium: 4 mEq/L (ref 3.5–5.1)
Sodium: 137 mEq/L (ref 135–145)
TCO2: 29 mmol/L (ref 0–100)

## 2010-08-15 LAB — POCT URINALYSIS DIP (DEVICE)
Bilirubin Urine: NEGATIVE
Glucose, UA: NEGATIVE mg/dL
Hgb urine dipstick: NEGATIVE
Ketones, ur: NEGATIVE mg/dL
Nitrite: NEGATIVE
Protein, ur: NEGATIVE mg/dL
Specific Gravity, Urine: <=1.005 (ref 1.005–1.030)
Urobilinogen, UA: 0.2 mg/dL (ref 0.0–1.0)
pH: 6.5 (ref 5.0–8.0)

## 2010-08-15 LAB — POCT PREGNANCY, URINE: Preg Test, Ur: NEGATIVE

## 2010-08-15 LAB — HEMOCCULT GUIAC POC 1CARD (OFFICE): Fecal Occult Bld: NEGATIVE

## 2010-08-23 ENCOUNTER — Inpatient Hospital Stay (INDEPENDENT_AMBULATORY_CARE_PROVIDER_SITE_OTHER)
Admission: RE | Admit: 2010-08-23 | Discharge: 2010-08-23 | Disposition: A | Discharge status: Home / Self Care | Payer: BC Managed Care – PPO | Source: Ambulatory Visit | Attending: Family Medicine | Admitting: Family Medicine

## 2010-08-23 DIAGNOSIS — J069 Acute upper respiratory infection, unspecified: Secondary | ICD-10-CM

## 2010-08-25 ENCOUNTER — Emergency Department (HOSPITAL_COMMUNITY)
Admission: EM | Admit: 2010-08-25 | Discharge: 2010-08-25 | Disposition: A | Discharge status: Home / Self Care | Payer: BC Managed Care – PPO | Attending: Emergency Medicine | Admitting: Emergency Medicine

## 2010-08-25 ENCOUNTER — Emergency Department (HOSPITAL_COMMUNITY): Payer: BC Managed Care – PPO

## 2010-08-25 ENCOUNTER — Encounter (HOSPITAL_COMMUNITY): Payer: Self-pay | Admitting: Radiology

## 2010-08-25 DIAGNOSIS — J40 Bronchitis, not specified as acute or chronic: Secondary | ICD-10-CM | POA: Insufficient documentation

## 2010-08-25 DIAGNOSIS — R071 Chest pain on breathing: Secondary | ICD-10-CM | POA: Insufficient documentation

## 2010-08-25 DIAGNOSIS — D649 Anemia, unspecified: Secondary | ICD-10-CM | POA: Insufficient documentation

## 2010-08-25 DIAGNOSIS — R0602 Shortness of breath: Secondary | ICD-10-CM | POA: Insufficient documentation

## 2010-08-25 LAB — CBC
HCT: 28.3 % — ABNORMAL LOW (ref 36.0–46.0)
Hemoglobin: 9.2 g/dL — ABNORMAL LOW (ref 12.0–15.0)
MCH: 24.9 pg — ABNORMAL LOW (ref 26.0–34.0)
MCHC: 32.5 g/dL (ref 30.0–36.0)
MCV: 76.5 fL — ABNORMAL LOW (ref 78.0–100.0)
Platelets: 267 10*3/uL (ref 150–400)
RBC: 3.7 MIL/uL — ABNORMAL LOW (ref 3.87–5.11)
RDW: 13.3 % (ref 11.5–15.5)
WBC: 5.1 10*3/uL (ref 4.0–10.5)

## 2010-08-25 LAB — DIFFERENTIAL
Basophils Absolute: 0 10*3/uL (ref 0.0–0.1)
Basophils Relative: 0 % (ref 0–1)
Eosinophils Absolute: 0 10*3/uL (ref 0.0–0.7)
Eosinophils Relative: 1 % (ref 0–5)
Lymphocytes Relative: 37 % (ref 12–46)
Lymphs Abs: 1.9 10*3/uL (ref 0.7–4.0)
Monocytes Absolute: 0.4 10*3/uL (ref 0.1–1.0)
Monocytes Relative: 8 % (ref 3–12)
Neutro Abs: 2.8 10*3/uL (ref 1.7–7.7)
Neutrophils Relative %: 54 % (ref 43–77)

## 2010-08-25 LAB — BASIC METABOLIC PANEL
BUN: 2 mg/dL — ABNORMAL LOW (ref 6–23)
CO2: 29 mEq/L (ref 19–32)
Calcium: 8.2 mg/dL — ABNORMAL LOW (ref 8.4–10.5)
Chloride: 109 mEq/L (ref 96–112)
Creatinine, Ser: 0.69 mg/dL (ref 0.4–1.2)
GFR calc Af Amer: >60 mL/min (ref >60)
GFR calc non Af Amer: >60 mL/min (ref >60)
Glucose, Bld: 75 mg/dL (ref 70–99)
Potassium: 3.9 mEq/L (ref 3.5–5.1)
Sodium: 141 mEq/L (ref 135–145)

## 2010-08-25 LAB — D-DIMER, QUANTITATIVE: D-Dimer, Quant: 0.61 ug/mL-FEU — ABNORMAL HIGH (ref 0.00–0.48)

## 2010-08-25 MED ORDER — IOHEXOL 300 MG/ML  SOLN
200.0000 mL | Freq: Once | INTRAMUSCULAR | Status: AC | PRN
  Administered 2010-08-25: 190 mL via INTRAVENOUS

## 2010-09-18 NOTE — Group Therapy Note (Signed)
NAME:  [name], Buser NO.:  [id number]   MEDICAL RECORD NO.:  [id number]          PATIENT TYPE:  WOC   LOCATION:  [facility]                   FACILITY:  [facility]   PHYSICIAN:  Argentina Donovan, MD        DATE OF BIRTH:  1975/12/05   DATE OF SERVICE:  04/06/2008                                  CLINIC NOTE   The patient is a 35 year old African American female who in 2005  underwent total abdominal hysterectomy and lysis of pelvic adhesions.  She has been well since then although she has had a problem with urinary  urgency, but in the past 2 months the urgency has gotten to the point  that it significantly interferes with her life and she does have a small  amount of stress incontinence.  Other than that she is in good health.  We got a urinalysis which is completely normal today.   EXAMINATION:  BREAST EXAM:  She has pendulous large breasts.  No nipple  discharge, no dominant masses and no supraclavicular, axillary nodes  noted.  ABDOMEN:  Soft, nontender.  No masses or organomegaly.  GENITALIA:  External genitalia is normal.  BUS within normal limits.  No  sign of significant cystocele noted with Valsalva or coughing.  The  vagina is clean and well rugated.  The apex of the vagina is status  total hysterectomy and the adnexa could not be palpated because of the  habitus of the patient which is 212 at 5 feet 5 inches tall.  We are  going to refer the patient for urological consultation.  This sounds  like a possible overactive bladder type of thing with inappropriate  bladder contractions.           ______________________________  Argentina Donovan, MD     PR/MEDQ  D:  04/06/2008  T:  04/06/2008  Job:  [id number]

## 2010-09-18 NOTE — H&P (Signed)
NAME:  [name], [name] NO.:  [id number]   MEDICAL RECORD NO.:  [id number]          PATIENT TYPE:  INP   LOCATION:  1616                         FACILITY:  [facility]   PHYSICIAN:  Della Goo, M.D. DATE OF BIRTH:  12/02/1975   DATE OF ADMISSION:  12/17/2008  DATE OF DISCHARGE:                              HISTORY & PHYSICAL   PRIMARY CARE PHYSICIAN:  Dr. Dorothyann Peng.   GASTROENTEROLOGIST:  Anselmo Rod, M.D.   CHIEF COMPLAINT:  Abdominal pain.   HISTORY OF PRESENT ILLNESS:  This is a 35 year old female who presents  to the emergency department secondary to complaints of worsening  epigastric abdominal pain over the past 24 hours.  She reports having  constant pain which is unrelieved by her medications.  She rates the  pain at this time as being a 9/10 and is tearful.  She states that she  has had epigastric abdominal pain for months and has been undergoing an  evaluation by gastroenterologist Dr. Charna Elizabeth and her primary care  physician, Dr. Dorothyann Peng.  The patient states that she is to undergo  an endoscopy in approximately 2 weeks.  She reports having been tested  for H.  Pylori and having positive results, having treatment for H.  Pylori.  The patient has also undergone a recent CT of the abdomen and  pelvis along with a transvaginal ultrasound for evaluation of this pain.  The results of the ultrasound did reveal a left complex ovarian cyst.  The ovarian cyst is not in the area of her pain and discomfort.   The patient reports having nausea, no vomiting.  She also reports that  her stools have been irregular, reporting that she has had dark stools  ranging from dark brown to dark green and an occasional black stool.  The patient reports beginning to have weakness and severe headache..  She reports that none of her medications have been able to relieve her  pain.  She states that previously she had been on Aciphex therapy and  reported the  Aciphex therapy worked temporarily, then it did not work at  all and her therapy was changed to a newer medication Dexilant  (Kapidex). She continues to state that this medication as well is not  working.  The patient describes the pain as being a burning pain and is  epigastric as well as periumbilical.   PAST MEDICAL HISTORY:  Significant for:  1. Chronic epigastric abdominal pain.  2. Chronic back pain.  3. Gastroesophageal reflux disease.  4. Depression.   PAST SURGICAL HISTORY:  1. History of a total abdominal hysterectomy.  2. Prior bilateral tubal ligation.  3. C-sections x2.  4. Cholecystectomy.  5. Right ankle arthroscopic knee and debridement.  6. Tonsillectomy and adenoidectomy.   MEDICATIONS:  At this time include:  1. Dexilant 60 mg 1 p.o. daily.  2. Lortab 10/500 mg 1 tablet p.o. q. 4-6 hours p.r.n.   ALLERGIES:  No known drug allergies.  However, Dilaudid cause itching.   SOCIAL HISTORY:  The patient is a nonsmoker, nondrinker.  She denies any  illicit drug  usage.   FAMILY HISTORY:  Positive for hypertension and diabetes in her mother,  brother and her maternal aunt.  Cancer in her paternal family.   REVIEW OF SYSTEMS:  Pertinents are mentioned above.   PHYSICAL EXAMINATION:  FINDINGS:  This is a 35 year old overweight  female in discomfort but no acute distress.  VITAL SIGNS: Temperature 98.3, blood pressure 144/89, heart rate 73,  respirations 20, O2 sats 100%.  HEENT EXAMINATION:  Normocephalic, atraumatic.  There is no scleral  icterus.  Pupils equally round, reactive to light.  Extraocular  movements are intact.  Funduscopic benign.  Nares are patent  bilaterally.  Oropharynx is clear.  NECK:  Supple full range of motion.  No thyromegaly, adenopathy or  jugular venous distention.  CARDIOVASCULAR: Regular rate and rhythm.  No murmurs, gallops or rubs.  LUNGS: Clear to auscultation bilaterally.  ABDOMEN: Positive bowel sounds, soft, tender in the  epigastrium and  above the umbilical area.  There is no hepatosplenomegaly.  No rebound  or guarding.  EXTREMITIES: Without cyanosis, clubbing or edema.  NEUROLOGIC EXAMINATION:  The patient is alert and oriented x3.  There  are no focal deficits.   LABORATORY STUDIES:  White blood cell count 5.8, hemoglobin 10.5,  hematocrit 32.5, MCV 79.0, platelets 304, neutrophils 50%, lymphocytes  41%.  Sodium 139, potassium 3.8, chloride 103, carbon dioxide 29, BUN 3,  creatinine 0.68 and glucose 77, albumin 4.0, AST 28, ALT 19, lipase 18.  Acute abdominal series performed, results of which are negative for  acute findings, no obstructed bowel gas pattern and no free air.   ASSESSMENT:  A 35 year old female being admitted with:  1. Epigastric abdominal pain.  2. Anemia.  3. Gastroesophageal reflux disease.  4. Weakness.   PLAN:  The patient will be admitted, placed on IV fluids and a clear  liquid diet for now.  Pain control therapy IV has been ordered as  needed.  Antiemetics have also been ordered.  The patient will be placed  on liquid Carafate q.6  hours and Reglan therapy at a low dose.  DVT and GI prophylaxis have  also been ordered.  Dr. Loreta Ave, gastroenterologist, or her on-call  partners will be notified of the patient's admission.  The patient's  diet will be advanced as tolerated.      Della Goo, M.D.  Electronically Signed     HJ/MEDQ  D:  12/18/2008  T:  12/18/2008  Job:  [id number]   cc:   Candyce Churn. Allyne Gee, M.D.  Fax: [phone]

## 2010-09-18 NOTE — Discharge Summary (Signed)
NAME:  [name], [name] NO.:  [id number]   MEDICAL RECORD NO.:  [id number]          PATIENT TYPE:  INP   LOCATION:  1616                         FACILITY:  [facility]   PHYSICIAN:  Renee Ramus, MD       DATE OF BIRTH:  [birth year]   DATE OF ADMISSION:  12/17/2008  DATE OF DISCHARGE:                               DISCHARGE SUMMARY   PRIMARY DISCHARGE DIAGNOSIS:  Abdominal pain chronic   SECONDARY DIAGNOSES:  1. Gastroesophageal reflux disease.  2. Microcytic anemia.  3. Lower back pain.  4. Depression.   HOSPITAL COURSE BY PROBLEM:  1. Abdominal pain.  The patient is a 35 year old female who has had a      4-5 month history of abdominal pain.  The patient was seen in the      emergency department secondary to an exacerbation of her pain which      she rates as 9/10.  The patient has been undergoing evaluation by      Dr. Loreta Ave and Dr. Dorothyann Peng.  The patient is due for endoscopy.      The patient had a positive Helicobacter pylori test and I am unsure      if she has actually undergone a 4 drug regimen for this.  The      patient is currently taking an advanced proton pump inhibitor.  The      patient has no lab abnormalities with the exception of a microcytic      anemia and her abdominal film is negative.  We did not have any      further testing that we are able to do at this time.  I do not      believe this represents an infectious etiology.  Likely she is      suffering from chronic peptic ulcer disease or gastric ulcer, and      further testing/evaluation should be through Dr. Loreta Ave.  The patient      will be discharged to home with instructions to follow up with Dr.      Loreta Ave.  We are giving her Ativan for anxiety.  2. Gastroesophageal reflux disease.  The patient will continue her      proton pump inhibitor.  3. Chronic lower back pain stable.  4. Depression currently stable.   LABS OF NOTE:  1. No evidence of leukocytosis, white count of 6.2.  2.  Mild anemia with a hemoglobin of 9.3, hematocrit 29.7 and an MCV of      79.6.  3. UA showing dilute urine with specific gravity of 1.001, no evidence      of infection.  4. Iron level of 65 with a percent sat of 24%, total iron binding      capacity of 273.   STUDIES:  Acute abdominal series showing right upper quadrant surgical  clips and a nonobstructive bowel gas pattern.  No evidence of  obstruction or infection.  Chest x-ray was read as normal.   MEDICATIONS ON DISCHARGE:  1. Dexilant 60 mg p.o. daily.  2. Lortab 1 tablet p.o. q.8 h p.r.n.  pain.  3. Ativan 0.5 mg one p.o. q.6 h p.r.n. anxiety.   There are no labs or studies pending at time of discharge.  The patient  is in stable condition but tearful and frustrated that no diagnosis has  been arrived at.   Time spent 35 minutes.      Renee Ramus, MD  Electronically Signed     JF/MEDQ  D:  12/18/2008  T:  12/18/2008  Job:  [id number]   cc:   Anselmo Rod, M.D.  Fax: [phone]   Candyce Churn. Allyne Gee, M.D.  Fax: [phone]

## 2010-09-18 NOTE — Op Note (Signed)
NAME:  [name], [name] NO.:  [id number]   MEDICAL RECORD NO.:  [id number]          PATIENT TYPE:  AMB   LOCATION:  [facility]SC                          FACILITY:  [facility]   PHYSICIAN:  Leonides Grills, M.D.     DATE OF BIRTH:  July 15, 1975   DATE OF PROCEDURE:  12/29/2007  DATE OF DISCHARGE:                               OPERATIVE REPORT   PREOPERATIVE DIAGNOSIS:  Right anterior ankle impingement.   POSTOPERATIVE DIAGNOSIS:  Right anterior ankle impingement.   OPERATIONS:  Right ankle arthroscopy with extensive debridement.   ANESTHESIA:  General.   SURGEON:  Leonides Grills, MD   ASSISTANT:  Richardean Canal, PA   ESTIMATED BLOOD LOSS:  Minimum.   TOURNIQUET TIME:  None.   COMPLICATIONS:  None.   DISPOSITION:  Stable to PR.   INDICATIONS:  This is a 35 year old female who was injured at work and  had persistent anterolateral ankle pain that was resistant to  conservative management.  She consented for the above procedure. All  risks including infection, vessel injury, persistent pain, worsening  pain, prolonged recovery, stiffness, arthritis, sinus formation,  synovial cyst formation, and prolonged recovery all explained, questions  were correctly answered.   OPERATION:  The patient was brought to the operating room and placed in  supine position.  After adequate general endotracheal anesthesia was  administered as well as Ancef 1 g IV, piggyback.  A bump was placed in  the right ipsilateral hip.  All bony prominences well padded.  Right  lower extremity was then prepped and draped in sterile manner.  No  tourniquet was used.  Anatomical landmarks include anterior tibialis  tendon and peroneus tertius tendon were then mapped out.  Superficial  peroneal nerve could not be seen.  Spinal needle was then placed in the  ankle just medial to the anterior tibialis tendon.  A 20 mL of normal  saline was instilled in the ankle.  In a nick and spread technique, it  was then  utilized to create the anteromedial portal medial to the  anterior tibialis tendon.  Blunt tip trocar with cannulas followed by  camera was then placed in the ankle.  Under direct visualization,  anterolateral portal was created lateral to the peroneus tertius tendon.  Prior to the nick and spread technique to create this anterolateral  portal lateral to the peroneus tertius tendon, the area was illuminated  from inside out to verify that superficial peroneal nerve was not in  this interval.  There was a large amount of synovitis concentrated in  the anterolateral aspect of the ankle.  There was accessory leg tib-fib  ligament that was hypertrophic and rubbing the anterolateral corner of  the talar dome.  There was a large amount of synovitis surrounding this  ligament that extended into the syndesmotic recess.  Then with a shaver  as well as radiofrequency bevel, an extensive debridement was then  performed removing the ligament as well as the synovitis in this area.  The ligament that was removed is the accessory tib-fib ligament.  Into  the lateral gutter, the synovitis extended and this  was also debrided as  well.  Ankle was ranged and there was no impinging areas in this area.  We then placed the camera anterolaterally visualizing anteromedially and  saw that there was a small patch of synovitis in this area as well.  This was also debrided with a radiofrequency bevel and shaver.  The  ankle was ranged and there was no impinging areas, especially  anteromedially, anteriorly, and anterolaterally.  There was no obvious  osteochondral lesions as well.  Pictures were obtained throughout the  procedure.  Camera was removed.  Wound was closed with 4-0 nylon stitch.  A sterile dressing was applied.  CAM Walker boot was applied.  The  patient was stable to the PR.      Leonides Grills, M.D.  Electronically Signed     PB/MEDQ  D:  12/29/2007  T:  12/30/2007  Job:  [id number]

## 2010-09-21 NOTE — Group Therapy Note (Signed)
   NAME:  [name], [name]                       ACCOUNT NO.:  [id number]   MEDICAL RECORD NO.:  [id number]                   PATIENT TYPE:  OUT   LOCATION:  [facility]                           FACILITY:  [facility]   PHYSICIAN:  Argentina Donovan, MD                     DATE OF BIRTH:  Sep 23, 1975   DATE OF SERVICE:  03/08/2003                                    CLINIC NOTE   HISTORY OF PRESENT ILLNESS:  This patient is a 35 year old black female  weighing 218 pounds gravida 2 para 2-0-0-2 by two cesarean sections who has  had severe heavy periods with severe dysmenorrhea and is somewhat anemic.  We placed her on oral contraceptives to see whether this would control her  pain.  It has controlled the heavy periods, but not the pain which is mainly  dyspareunia with deep penetration.  She has never had that in the past until  a few months ago.  Ultrasound of the pelvis was completely normal and the  patient probably has endometriosis.  Since bleeding is under control we have  encouraged her to continue on iron therapy for two more months and then if  the pain is still persisting we will undertake surgery as a corrective  measure.   DIAGNOSES:  1. Iron deficiency anemia secondary to heavy periods.  2. Dyspareunia.                                               Argentina Donovan, MD    PR/MEDQ  D:  03/08/2003  T:  03/08/2003  Job:  [id number]

## 2010-09-21 NOTE — Op Note (Signed)
[facility]  Patient:    [name], [name] Visit Number: [id number] MRN: [id number]          Service Type: OBS Location: 910A 9112 [id number] Attending Physician:  Leonard Schwartz Adm. Date:  12/29/2000                             Operative Report  PREOPERATIVE DIAGNOSES:       1. Intrauterine pregnancy at term.                               2. Desire elective repeat cesarean section and                                  sterilization.  POSTOPERATIVE DIAGNOSES:      1. Intrauterine pregnancy at term.                               2. Desire elective repeat cesarean section and                                  sterilization.  PROCEDURES:                   1. Repeat cesarean section.                               2. Bilateral tubal ligation.  SURGEON:                      Naima A. Normand Sloop, M.D.  ASSISTANT:                    Janine Limbo, M.D.  ESTIMATED BLOOD LOSS:         700 cc.  IV FLUIDS:                    3000 cc.  URINE OUTPUT:                 350 cc clear urine at the end of the procedure.  FINDINGS:                     One female infant, vertex presentation, with Apgars of 8 and 9.  Normal uterus, tubes and ovaries.  COMPLICATIONS:                None.  DISPOSITION:                  The patient went to the recovery room in stable condition.  PROCEDURE IN DETAIL:          The patient was taken to the operating room and placed in the dorsal supine position with a left lateral tilt.  A Pfannenstiel skin incision was then made with a knife 2 cm above the symphysis pubis and carried down through the fascia.  The fascia was then incised in the midline and extended laterally with Mayo scissors.  Kochers x 2 were then used to elevate the fascia off the rectus muscles both superiorly and inferiorly.  THe rectus muscles were  separated in the midline.  The peritoneum was identified , tented up and entered sharply with Metzenbaum  scissors.  A bladder blade was then inserted.  The vesicouterine peritoneum was then identified, tented up and entered sharply.  The bladder flap was then formed using Metzenbaum scissors.  The bladder blade was then reinserted.  A lower transverse uterine incision was then made with a knife and extended laterally with bandage scissors.  The infant was delivered without difficulty.  the mouth and nares were bulb suctioned.  The infant was then handed off to the pediatricians. The cord was clamped and cut.  The placenta was then manually extracted.  The uterine incision was repaired with 0 Vicryl in a running fashion.  The patients left fallopian tube was identified and followed out to the fimbriated end.  Then, 2-0 plain was used to ligate a 2 cm portion of tube. The tube was then cut.  Hemostasis was assured.  The patients right fallopian tube was identified, grasped with Babcocks and manipulated in a similar fashion.   Hemostasis was noted.  The abdomen was irrigated with normal saline.  The fascia was then closed with 0 Vicryl in a normal fashion.  The skin was closed with staples.  Sponge, lap, and needle counts were correct x 2.  The patient went to the recovery room in stable condition. Attending Physician:  Leonard Schwartz DD:  12/29/00 TD:  12/29/00 Job: [phone]

## 2010-09-21 NOTE — Discharge Summary (Signed)
NAME:  [name], Sheila                       ACCOUNT NO.:  [id number]   MEDICAL RECORD NO.:  [id number]                   PATIENT TYPE:  INP   LOCATION:  9309                                 [facility]ACILITY:  [facility]   PHYSICIAN:  Phil D. Okey Dupre, M.D.                  DATE OF BIRTH:  1975-06-20   DATE OF ADMISSION:  12/16/2003  DATE OF DISCHARGE:                                 DISCHARGE SUMMARY   HOSPITAL COURSE:  The patient, a 35 year old African-American, was  readmitted on December 16, 2003 4 days post total abdominal hysterectomy for  pain, revealed endometriosis.  Her main complaint was an upper right  quadrant pain where she was extremely tender in that area and had a history  of gallbladder removal as a teenager.  Incidentally, she had a large wound  seroma under her enormous abdominal pannus which was drained and irrigated  during her stay here.  She went home with a normal white count but came back  with a 19,000 and was running fevers.  She has been afebrile for the last 36  hours.  Was treated with triple antibiotics and I am fairly certain that the  wound seroma was infected and was the cause of her fever.  The wound is  clean now, there is no foul discharge, and will continue to have home health  see her daily and will recheck that wound again in 1 week, and have it  irrigated and packed each day.  The white count was down to 10.  She is  begin sent home on iron.  Her hemoglobin is still dropping, was 7.3 although  there is no sign of any bleeding.  I think this is probably due to the  hemolysis secondary to the fever that the patient was running.  The patient  was sent home previously on iron and will continue that.  She has had bowel  movements but continues to complain of constipation.  We will continue to  keep her on a stool softener with a laxative.  Her pain tolerance not being  very good, she seemed to do well on the 4 mg of Dilaudid.  The upper right  quadrant pain is still  a dilemma.  I think eventually we will have her  evaluated by gastroenterology but she is on Zantac and Protonix which she  has been on for GERD for some time and this may be related to that.  She had  a CAT scan during her stay here which, with the exception of a colon  distended by stool, the kidneys looked normal as was the pancreas, spleen,  and adrenals so it is certainly possible, although her abdomen is soft and  nontender to palpation, that this may be due to her problem with  constipation.  The patient will be discharge on Dilaudid, Peri-Colace,  ampicillin, and Cleocin.  Will be continued on iron.  She  takes Prozac and  Protonix and we will continue her on that along with the Zantac and see her  in 1 week in the MAU and have her continue to be followed by home health.   DISCHARGE DIAGNOSES:  1. Wound seroma, resolving.  2. Upper right quadrant abdominal pain.                                               Phil D. Okey Dupre, M.D.    PDR/MEDQ  D:  12/19/2003  T:  12/19/2003  Job:  [id number]
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
## 2010-09-21 NOTE — Group Therapy Note (Signed)
NAME:  JEFFRIES, [name] NO.:  [id number]   MEDICAL RECORD NO.:  [id number]                   PATIENT TYPE:  OUT   LOCATION:  [facility]                           FACILITY:  [facility]   PHYSICIAN:  Argentina Donovan, MD                     DATE OF BIRTH:  1975-12-29   DATE OF SERVICE:                                    CLINIC NOTE   HISTORY OF PRESENT ILLNESS:  The patient is a 35 year old gravida 2, para 2-  0-0-2 by two cesarean sections.  She is a lady who has complained of heavy  periods and severe dyspareunia on deep penetration for about 1-1/2 years.  She has been with the same partner for some time and prior to that did not  have any pain.  She states that the pain with intercourse is there all the  time with deep penetration but is worse prior to her period.  She has had a  long history of anemia, she states she has low blood and was told that was  the cause of her pain.  When asked about details of the heaviest day of her  period how many pads she uses she stated three.  Her period does last seven  days and sometimes longer.   PHYSICAL EXAMINATION:  The abdomen is soft, flat, nontender, no masses, no  organomegaly.  The external genitalia are normal. EBGUS within normal  limits.  The vagina is clean and well rugated.  There is no sign of cyst or  rectocele.  The cervix is central, tender to motion and the vaginal is  somewhat short.  The uterus and adnexa could not be well outlined.   IMPRESSION:  This patient possibly has endometriosis.  This did not seem to  be excessive and as far as the anemia it is secondary to some other problem.  In addition, she does not have regular bowel movements.  We have talked to  her about that and the increased incidence of bowel cancer in women who do  not have regular bowel movements daily.   PLAN:  My plan is to get a sonogram since we do not know what the uterus or  ovaries look like, and has she not had on in some  time.  A CBC was checked  on her for hemoglobin and we will give her a cycle for two months on oral  contraceptives.  We will revisit her, and if the pain has not been helped  would undertake a TAH in this lady that has already had a tubal ligation.                                               Argentina Donovan, MD    PR/MEDQ  D:  01/04/2003  T:  01/04/2003  Job:  [id number]

## 2010-09-21 NOTE — Group Therapy Note (Signed)
NAME:  [name], [name] NO.:  [id number]   MEDICAL RECORD NO.:  [id number]          PATIENT TYPE:  WOC   LOCATION:  [facility]                   FACILITY:  [facility]   PHYSICIAN:  Elsie Lincoln, MD      DATE OF BIRTH:  Sep 17, 1975   DATE OF SERVICE:  02/07/2004                                    CLINIC NOTE   REASON FOR VISIT:  The patient is a 35 year old female status post  hysterectomy on December 12, 2003 for chronic pelvic pain and PID.  The patient  had a wound disruption that required her to have home health care come and  change dressings.  She presents today for follow-up wound and incision  check.  The wound is fully healed except for a subcentimeter piece of  granulation tissue in the midline and a subcentimeter piece of granulation  tissue on the left apex.  Silver nitrate was easily applied to these areas.  The rest of the incision is intact and well healed.  The patient only  complains of constipation, which is a chronic problem for her.  She was  instructed to take Colace in the past; however, she did not have the money  for this.  She did not know there was a generic available.   PHYSICAL EXAMINATION:  VITAL SIGNS:  Pulse 83, blood pressure 115/72, weight  237.  ABDOMEN:  Obese, soft, nontender, nondistended.  Incision intact, well  healed as described above.   ASSESSMENT AND PLAN:  A 35 year old female status post wound infection from  total abdominal hysterectomy that is now well healed.   1.  Silver nitrate applied as described above.  2.  Docusate sodium twice a day.  3.  The patient to return to work full-time.  4.  Return to clinic in 1 year for yearly pelvic exam.      KL/MEDQ  D:  02/07/2004  T:  02/07/2004  Job:  [id number]

## 2010-09-21 NOTE — Discharge Summary (Signed)
NAME:  [name], BUFFALO                       ACCOUNT NO.:  [id number]   MEDICAL RECORD NO.:  [id number]                   PATIENT TYPE:  INP   LOCATION:  9319                                 [facility]ILITY[facility]   PHYSICIAN:  Phil D. Okey Dupre, M.D.                  DATE OF BIRTH:  [birth year]   DATE OF ADMISSION:  12/12/2003  DATE OF DISCHARGE:  12/15/2003                                 DISCHARGE SUMMARY   HOSPITAL COURSE:  The patient is a 35 year old para 2-0-0-2 who underwent  total abdominal hysterectomy on the day of admission for chronic pelvic pain  and pelvic adhesions.  Has done well postoperatively from an objective  standpoint, with a soft abdomen, passing flatus, and normal, well-healing  incision.  No sign of venous thrombosis, and clear lungs.  The patient's  biggest problem has been on pain control, which was also a problem prior to  surgery.  She tolerates almost no discomfort at all and cannot understand  why she feels any pain when given Motrin and Dilaudid.  Physical examination  the day of discharge is normal.  The patient is being discharged with a  hemoglobin of 8.2 and hematocrit of 25 which has been stabilized over the  last 24 hours.  She started out with a hemoglobin of 10.4 and a hematocrit  of 33.  We are discharging her on:  1. Valium 10 mg t.i.d.  2. Motrin 600 q.6h.  3. Dilaudid 2 mg q.4h. p.r.n.  4. Ferro-Sequels not to be started per our instructions to her until she has     normal bowel movements.   We have also discussed activity - stairs especially, and lifting of weights,  as well as driving.  We have discussed diet with the patient and that she  should avoid dairy and fruit juices until her gas pains are well under  control.  The pathology is not available as yet from the surgery and the  patient will be followed up in 2 weeks in the GYN clinic after coming to [facility]  on Monday - i.e. in 4 days - for staple removal.  She has also been given  specific  instructions because of her panniculus on wound care.   DISCHARGE DIAGNOSIS:  Status healing well post total abdominal hysterectomy.                                               Phil D. Okey Dupre, M.D.    PDR/MEDQ  D:  12/15/2003  T:  12/16/2003  Job:  [id number]

## 2010-09-21 NOTE — Op Note (Signed)
NAME:  [name], [name]                       ACCOUNT NO.:  [id number]   MEDICAL RECORD NO.:  [id number]                   PATIENT TYPE:  INP   LOCATION:  9319                                 [facility]   PHYSICIAN:  Phil D. Okey Dupre, M.D.                  DATE OF BIRTH:  09-18-1975   DATE OF PROCEDURE:  12/12/2003  DATE OF DISCHARGE:                                 OPERATIVE REPORT   PREOPERATIVE DIAGNOSIS:  Chronic pelvic pain secondary to pelvic  inflammatory disease of pelvic adhesions.   POSTOPERATIVE DIAGNOSIS:  Chronic pelvic pain secondary to pelvic  inflammatory disease of pelvic adhesions with probable endometriosis.   PROCEDURE:  Total abdominal hysterectomy and right salpingectomy.   SURGEON:  Javier Glazier. Okey Dupre, M.D.   FIRST ASSISTANT:  Burnadette Peter, M.D.   ESTIMATED BLOOD LOSS:  300 mL.   PATHOLOGY:  Organs sent to pathology were the uterus, cervix and right  fallopian tube.   POSTOPERATIVE CONDITION:  Satisfactory.   OPERATIVE FINDINGS:  Upon entering the peritoneal cavity, attention was  directed to the pelvis where there were multiple pelvic adhesions broken up  mainly by blunt dissection.  Both ovaries were cystic with follicular cysts  on the right ovary and the left ovary showing a small hemorrhagic cyst.  The  right fallopian tube especially was enlarged and showed signs of  inflammation.   DESCRIPTION OF PROCEDURE:  Under satisfactory general anesthesia with the  patient in the dorsal supine position, the vagina had been prepped and a  Foley catheter placed in the urinary bladder.  The abdomen was prepped and  draped in the usual sterile manner.  Through a previous Pfannenstiel  transverse low abdominal scar situated 3 cm above the symphysis pubis and  extending for a total length of about 16 cm.  The abdomen was entered by  layers down to fascia which was opened transversely and the rectus muscles  on both sides were opened transversely by  electrocautery and sharp  dissection.  The inferior gastric vessels were clamped, divided and ligated  with 1 chromic catgut suture ligatures.  Upper abdominal position was  explored and found to be within normal limits.  The bowel was packed away  with the Balfour retractor placed through the peritoneal cavity.  The pelvic  adhesions in the pelvis were broken up by blunt dissection, and the findings  were as above.  Straight Kocher clamps were used to elevate the uterus by  placing them just lateral to the uterus and across the utero-ovarian  pedicles down to include the round ligament.  The round ligaments were then  ligated with 1 chromic catgut suture ligature, divided, and the inferior end  of the broad ligament opened and it extended around the anterior superior  portion of the cervix.  Openings were made in the avascular portions of the  broad ligament through which 1 chromic catgut sutures  were brought and tied  medial to the ovaries on both sides.  A curved Kocher clamp was placed  medially with the aforementioned tie.  Tissue medial to that was divided and  the lateral pedicle ligated with 1 chromic catgut suture ligature.  The  fallopian tube which was so malformed on the right side was clamped at its  base across the meso and excised by sharp dissection with the pedicle being  ligated with 1 chromic catgut suture ligatures.  The bladder was then pushed  away from the lower uterine segment by blunt dissection, and the uterine  vessels were skeletonized, divided and ligated with 1 chromic catgut suture  ligatures, numbering 2.  The cardinal ligaments were then clamped with a  straight Kocher clamps, divided and ligated with 1 chromic catgut suture  ligature and the cervix was dissected away from the apex of the vagina by  sharp dissection.  Lateral vaginal cuff sutures of 1 chromic were used for  hemostasis and vaginal cuff support.  Figure-of-eight sutures were used to  close the  remainder of the vaginal cuff with 1 chromic catgut suture.  The  areas were observed for bleeding.  There was a small bleeder posterior to  the cervix near the right uterosacral ligament which was controlled with a  small figure-of-eight sterile Vicryl suture.  The hemorrhagic cyst on the  left ovary was evacuated and bleeding from that controlled with a small  figure-of-eight 3-0 Vicryl suture on a GI needle.  There was significant  bleeding noted at the end of the procedure.  The pelvis was irrigated.  Tape, instrument, sponge and needle counts were reported as correct at this  point, and the abdominal fascia was closed with a continuous running locked  0 Vicryl on an atraumatic needle.  Subcutaneous closure was carried out with  2-0 fine catgut sutures and the skin edges were approximated with skin  staples.  Dry sterile dressing was applied.  Total blood loss during the  procedure was approximately 300 mL.  Tape, instrument, sponge and needle  counts were reported correct at the end of the procedure on a second count,  and the patient was transferred to the recovery room in satisfactory  condition with the Foley catheter draining clear urine at the end of the  procedure.                                               Phil D. Okey Dupre, M.D.    PDR/MEDQ  D:  12/12/2003  T:  12/13/2003  Job:  [id number]

## 2010-09-21 NOTE — H&P (Signed)
[facility]  Patient:    [name], [name] Visit Number: [id number] MRN: [id number]          Service Type: OBS Location: 910A 9112 01 Attending Physician:  Leonard Schwartz Dictated by:   Nigel Bridgeman, C.N.M. Adm. Date:  12/29/2000                           History and Physical  HISTORY OF PRESENT ILLNESS:  [name] is a 35 year old gravida 2, para 1-0-0-1 who presents today for repeat cesarean section and bilateral tubal ligation.  Pregnancy has been remarkable for: 1. Previous cesarean section with desire    for repeat. 2. Desires tubal sterilization. 3. A-negative blood type. 4. Obesity. 5. Transfer from Deniece Ree, M.D. at 32 weeks.  PRENATAL LABS:                Blood Type A-negative.  Rh antibody negative. VDRL nonreactive.  Rubella titer positive.  Hepatitis B surface antigen negative.  Sickle cell test negative.  Hemoglobin at entry into obstetrical care 9.7.  Pap normal.  GC and chlamydia cultures were negative.  Hepatitis C antibody testing negative in March 2002.  Glucose challenge negative.  A&P normal.  Group B strep culture positive at 36 weeks.  EDC of January 12, 2001 was established at the last menstrual period and was in agreement with ultrasound at 14 and 24 weeks.  HISTORY OF PRESENT PREGNANCY: The patient transferred from Dr. Holley Raring office at 32 weeks. She was having difficulty sleeping and was given Ambien.  She elected to proceed with repeat cesarean section and tubal sterilization.  She was given a prescription for iron and Colace at 36 weeks.  She did have some headaches during her pregnancy.  OBSTETRICAL HISTORY:          In 1994 she had a primary low transverse cesarean section for a female infant weighing 7 pounds 2 ounces at [redacted] weeks gestation.  She was induced.  She did not have any progression in labor, and was delivered by cesarean section.  She did have RhoGAM during that pregnancy.  She also  was on iron for anemia.  MEDICAL HISTORY:              She was on Depo-Provera from 44 to 1998.  An oral contraceptive until September 2001, when she discontinued.  She reports rare yeast infections.  She reports the usual childhood illnesses.  History of anemia.  She has been hospitalized x 1 in 2001 for pyelonephritis.  SURGICAL HISTORY:             Includes tonsillectomy and adenoidectomy and gallbladder removal as a teen.  Her only other hospitalization was for childbirth.  ALLERGIES:                    No known drug allergies.  FAMILY HISTORY:               Her mother is hypertensive, on medications.  Her maternal grandmother is also hypertensive.  Paternal grandmother is diabetic on oral medication.  GENETIC HISTORY:              Unremarkable.  SOCIAL HISTORY:               The patient is single.  The father of the baby is involved and supportive; his name is Joline Maxcy. The patient is high school educated.  She is employed  as a Conservation officer, nature.  Her partner has an eleventh grade education.  She is African-American.  She denies any alcohol, drug or tobacco use during this pregnancy.  PHYSICAL EXAMINATION:  VITAL SIGNS:                  Stable.  The patient is afebrile.  HEENT:                        Within normal limits.  LUNGS:                        Breath sounds were clear.  HEART:                        Regular rate and rhythm; without murmur or rubs.  BREASTS:                      Soft and nontender.  ABDOMEN:                      Fundal height approximately 42 cm.  Estimated fetal weight 7-7.5 pounds.  Uterine contractions are very occasional and mild. Fetal heart rate in the 140s by Doppler.  PELVIC:                       Deferred.  EXTREMITIES:                  Deep tendon reflexes are 2+ without clonus. There is trace of edema noted.  IMPRESSION:                   1.  Intrauterine pregnancy at 38 weeks.                               2.  Previous cesarean  section, with desire for                                   repeat.                               3.  Desires tubal sterilization.                               4.  Rh negative.                               5.  Positive group B strep.                               6.  Obesity.  PLAN:                         1.  Admit to [facility] for                                   consult with Dr. Marline Backbone as  attending physician.                               2.  Routine physician preoperative orders. Dictated by:   Nigel Bridgeman, C.N.M. Attending Physician:  Leonard Schwartz DD:  01/01/01 TD:  01/01/01 Job: [id number]
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
## 2010-09-21 NOTE — Discharge Summary (Signed)
[facility]  Patient:    [name], [name] Visit Number: [id number] MRN: [id number]          Service Type: OBS Location: 910A 9112 01 Attending Physician:  Leonard Schwartz Dictated by:   Nigel Bridgeman, C.N.M. Adm. Date:  12/29/2000 Disc. Date: 01/01/01                             Discharge Summary  ADMITTING DIAGNOSES:          1. Intrauterine pregnancy at term.                               2. Elective repeat cesarean section.                               3. Desires tubal sterilization.  DISCHARGE DIAGNOSES:          1. Intrauterine pregnancy at term.                               2. Elective repeat cesarean section.                               3. Desires tubal sterilization.  PROCEDURES:                   1. Repeat low transverse cesarean section.                               2. Bilateral tubal sterilization.                               3. Epidural anesthesia.  HOSPITAL COURSE:              Ms. Minner is a 35 year old, gravida 2, para 1-0-0-1 at 2 weeks, who presented for scheduled cesarean section with tubal sterilization. Pregnancy had been remarkable for (1) previous low transverse cesarean section with desire for repeat, (2) desires tubal sterilization, (3) Rh negative, (4) positive group B strep, (5) obesity, and (6) transfer from Dr. Holley Raring office at 32 weeks.  The patient was taken to the operating room where a repeat low transverse cesarean section was performed by Dr. Jaymes Graff and Dr. Marline Backbone under epidural anesthesia. Findings were a viable female by the name of [name]. Apgars were 8 and 9. Weight was 6 pounds 7 ounces. There were normal uterus, tubes, and ovaries noted. There were no complications. Estimated blood loss was 700 cc. The infant was taken to the full-term nursery. Mother was taken to recovery room in good condition. She also received a tubal sterilization at the time of her surgery.  By  postoperative day #1 the patient was doing well. Her hemoglobin was 9.6, down from 10.3. She was up ad lib. She was bottle feeding. The rest of her hospital stay was uncomplicated. By postoperative day #3 she was doing well and ready to go home. The decision was made to maintain her staples until January 06, 2001. The patient was agreeable with this plan.  DISCHARGE INSTRUCTIONS:       Instructions are per  [facility] handout.  DISCHARGE MEDICATIONS:        1. Motrin 600 mg p.o. q.6h. p.r.n. pain.                               2. Tylox one to two p.o. q.3-4h. p.r.n. pain.  DISCHARGE FOLLOWUP:           Followup will occur on January 06, 2001 at 10 a.m. for staple removal and then at six weeks postpartum and p.r.n. Dictated by:   Nigel Bridgeman, C.N.M. Attending Physician:  Leonard Schwartz DD:  01/01/01 TD:  01/01/01 Job: [id number]

## 2010-09-21 NOTE — Procedures (Signed)
REQUESTING PHYSICIAN:  Maurice March, M.D.   CLINICAL HISTORY:  A 35 year old woman with syncopal event. EEG is performed  for evaluation of possible seizure. This is a routine EEG done with photic  stimulation and hyperventilation. The patient is described is awake and  asleep.   DESCRIPTION:  The dominant rhythm of this tracing during awake is a moderate  amplitude alpha rhythm of 10 to 11 hertz which predominates posteriorly and  appears without abnormal asymmetry and attenuates with eye opening and  closing. Low amplitude fast activity is seen frontally and centrally and  appears without abnormal asymmetry. No focal slowing is noted and no  epileptiform discharges are seen. Most of the recording is made in  drowsiness and sleep as well demonstrated by the presence of sleep spindles,  vertex waves, and K-complexes. Occasional high amplitude theta waves with a  sharp contour are seen symmetrically in the temporal areas during sleep, but  there are no focal or lateralizing epileptiform discharges seen in the sleep  state. Photic stimulation produced weak driving response. Hyperventilation  was not performed. Single channel devoted to EKG revealed sinus rhythm  throughout with rate of approximately 78 beats per minute.   CONCLUSION:  Normal study in the awake, drowsy, and sleep states.      ZHY:QMVH  D:  07/25/2004 18:41:13  T:  07/26/2004 11:57:49  Job #:  [id number]

## 2010-09-21 NOTE — Group Therapy Note (Signed)
NAME:  JEFFRIES, [name] NO.:  [id number]   MEDICAL RECORD NO.:  [id number]                   PATIENT TYPE:  OUT   LOCATION:  [facility]                           FACILITY:  [facility]   PHYSICIAN:  Argentina Donovan, MD                     DATE OF BIRTH:  August 25, 1975   DATE OF SERVICE:  07/21/2003                                    CLINIC NOTE   REASON FOR VISIT:  This patient is a 35 year old gravida 2 para 2-0-0-2 who  had two previous C-sections and has been disabled by chronic pelvic pain.  We did a dilatation, curettage, and open laparoscopy.  The patient had  significant pelvic adhesion with clubbed fallopian tubes and chronic pelvic  inflammatory disease.  She said her mother and grandmother both had a  hysterectomy for the same thing.  The patient desire hysterectomy.  We will  try, we told, and maintain the ovaries if that is at all possible but if it  looks like she is going to need further surgery if we leave them, we will  take them out.  The dyspareunia and dysmenorrhea have gotten worse and the  dysmenorrhea is being accompanied by nausea and vomiting.  The patient  weighs 233 pounds and is 5 feet 5 inches.  She prefers that the surgery be  done in the summertime when she is off of work and we will schedule it some  time in June.  We have told the patient to call us and remind and we will go  ahead and get things scheduled and then have her come in a week or so prior  to the surgery.  Photos of the pelvic findings are on her clinic chart.   DIAGNOSIS:  Chronic pelvic pain secondary to pelvic adhesions, chronic  pelvic inflammatory disease.                                               Argentina Donovan, MD    PR/MEDQ  D:  07/21/2003  T:  07/22/2003  Job:  [id number]

## 2010-09-21 NOTE — Op Note (Signed)
NAME:  [name], [name]                       ACCOUNT NO.:  [id number]   MEDICAL RECORD NO.:  [id number]                   PATIENT TYPE:   LOCATION:                                       FACILITY:   PHYSICIAN:  Phil D. Okey Dupre, M.D.                  DATE OF BIRTH:  1975/09/18   DATE OF PROCEDURE:  DATE OF DISCHARGE:                                 OPERATIVE REPORT   PREOPERATIVE DIAGNOSES:  1. Chronic pelvic pain.  2. Menorrhagia.   POSTOPERATIVE DIAGNOSES:  Pelvic pain was pelvic adhesions with clubbed  fallopian tubes and chronic pelvic inflammatory disease.   PROCEDURES:  Dilatation and curettage and open laparoscopy.   SURGEON:  Javier Glazier. Rose, M.D.   ESTIMATED BLOOD LOSS:  Less than 10 mL.   ANESTHESIA:  General.   OPERATIVE FINDINGS:  On entering the peritoneal cavity, there were massive  pelvic adhesions with clubbing of both fallopian tubes.   The procedure went as follows:  Under satisfactory general anesthesia with  the patient in the dorsal lithotomy position, the perineum and vagina and  abdomen were prepped and draped in the usual sterile manner.  Bimanual  pelvic examination under anesthesia revealed the uterus was normal size,  shape, and consistency.  __________ were freely movable.  The adnexa could  not be palpated because of the heaviness of the patient.  A weighted  speculum was placed at the posterior fourchette of the vagina through a  marital introitus.  The BUS were within normal limits.  The vagina was clean  and well-rugated.  The anterior lip of the parous cervix was grasped with  the single-tooth tenaculum and the uterine cavity sounded to a depth of 8  cm.  The cervical os was dilated to a #8 Hegar dilator and the uterine  cavity curetted with a small serrated curette, followed by a large sharp  curette.  The specimen was sent for pathologic diagnosis.  An acorn cannula  was then placed in the cervix, attached to the tenaculum for mobilization of  the uterus and a 1 cm incision was made just below the umbilicus, opened  down to the peritoneum, through which a Veress needle was placed and  approximately 3 L of carbon dioxide slowly insufflated in the peritoneal  cavity.  The laparoscopic trocar was then inserted under direct vision into  the peritoneal cavity, the trocar removed from the sleeve, the laparoscope  inserted, and the findings were as above.  There was no injury noted to any  other bowel or viscus.  The scope was removed and the fascia closed with the  0 Vicryl suture.  This was run up as a subcutaneous and subcuticular  closure.  Dry sterile dressing was applied and patient transferred to the  recovery room in satisfactory condition.  Please note the photos in the  chart.  Phil D. Okey Dupre, M.D.   PDR/MEDQ  D:  07/08/2003  T:  07/08/2003  Job:  [id number]

## 2010-09-21 NOTE — Discharge Summary (Signed)
NAME:  [name], [name] NO.:  [id number]   MEDICAL RECORD NO.:  [id number]          PATIENT TYPE:  INP   LOCATION:  1302                         [facility]ACILITY[facility]   PHYSICIAN:  Altha Harm, MDDATE OF BIRTH:  Jul 20, 1975   DATE OF ADMISSION:  07/26/2006  DATE OF DISCHARGE:  07/29/2006                               DISCHARGE SUMMARY   DISPOSITION:  Home.   DISCHARGE DIAGNOSES:  1. Adynamic ileus.  2. Constipation.  3. Chronic back pain.  4. History of depression.   DISCHARGE MEDICATIONS:  1. MiraLax 17 g in 8 ounces of water daily.  2. Lactulose 15 mL p.o. b.i.d. p.r.n. constipation.  3. Cymbalta 60 mg p.o. daily.  4. Vicodin one to two tablets p.o. q.4-6 h. p.r.n. severe pain.  5. Peri-Colace one to two tablets p.o. q.6-12 h. p.r.n.   CONSULTATIONS:  None.   PROCEDURES:  None.   DIAGNOSTIC STUDIES:  Abdominal x-ray which shows gaseous distention  consistent with an ileus pattern.   ALLERGIES:  No known drug allergies.   CHIEF COMPLAINT:  Abdominal pain, nausea and vomiting.   HISTORY OF PRESENT ILLNESS:  Please see dictation by Dr. Lovell Sheehan for  details of the HPI.   HOSPITAL COURSE:  Problem 1.  ILEUS:  The patient was initially placed  on clear fluids.  She was given laxatives and Reglan to assist with the  constipation.  The patient initially had a worsening ileus and was made  n.p.o. with bowel rest.  The ileus resolved clinically and the patient  was started on clear liquids and advanced to a regular diet that she is  tolerating without any difficulty.  The patient has had several bowel  movements during her hospital stay here.   Problem 2.  CHRONIC BACK PAIN:  The patient is continued on IV pain  medications.   Problem 3.  DEPRESSION:  The patient is continued on her Cymbalta.   CONDITION ON DISCHARGE:  Stable.  Afebrile and vital signs are stable.   DIET:  The patient should be on a high fiber diet.   ACTIVITY:  No physical  restrictions.   FOLLOW UP:  The patient is to follow up with her primary physician in 1  week.      Altha Harm, MD  Electronically Signed     MAM/MEDQ  D:  07/29/2006  T:  07/29/2006  Job:  [id number]

## 2010-09-21 NOTE — H&P (Signed)
NAME:  [name], [name]                       ACCOUNT NO.:  [id number]   MEDICAL RECORD NO.:  [id number]                   PATIENT TYPE:  INP   LOCATION:  NA                                   [facility]ACILITY[facility]   PHYSICIAN:  Phil D. Okey Dupre, M.D.                  DATE OF BIRTH:  1976/02/12   DATE OF ADMISSION:  DATE OF DISCHARGE:                                HISTORY & PHYSICAL   DATE OF ADMISSION:  For surgery at [facility] at 1 p.m. on November 21, 2003.   CHIEF COMPLAINT:  Pelvic pain.   PRESENT ILLNESS:  The patient is a 35 year old black female para 2-0-0-2  with two cesarean sections in her history who has had heavy periods and  severe dysmenorrhea especially over the past year.  She also over the past 6  months has been bothered by increasing dyspareunia on deep penetration.  She  underwent laparoscopy in March 2005 which showed significant pelvic  adhesions and bilateral hydrosalpinx.  We tried to treat the patient  conservatively but the pain has gotten no better and the patient has opted  for abdominal hysterectomy with possible bilateral salpingo-oophorectomy.  We have discussed in detail with the patient the disadvantages of  oophorectomy and that if we felt that the ovaries were not going to  necessitate another surgery in the near future we would leave one or both  but often with pelvic inflammatory disease and the scarring and adhesions  the patient is better off having had the ovaries out and supplementing  estrogen therapy postoperatively.  She agrees with this choice.  We have  also discussed in detail postoperative possible symptoms and complications.   PAST MEDICAL HISTORY:  The patient has had two cesarean sections, one  laparoscopy, and suffers from depression.   MEDICATIONS:  The patient is currently on:  1. Prozac 20 mg q.a.m.  2. Prevacid also in the morning for GERD.   ALLERGIES:  The patient has no known allergies.   FAMILY HISTORY:  The patient's  mother and grandmother both have hypertension  and diabetes.   SOCIAL HISTORY:  The patient does not smoke.  She drinks socially and does  not take illicit drugs.   REVIEW OF SYSTEMS:  Negative with the exception of the present illness.   PHYSICAL EXAMINATION:  VITAL SIGNS:  The patient has a blood pressure of  105/74, a pulse of 71, respirations 18 per minute, temperature 98.6.  The  patient weighs 233 pounds, is 5 feet 6 inches tall.  GENERAL:  This is a well-developed, quite obese, black female in no acute  distress.  HEENT:  PERRLA.  Otherwise, within normal limits.  NECK:  Supple. Thyroid is normal, symmetrical, without masses.  BACK:  Erect.  LUNGS:  Clear to auscultation and percussion.  HEART:  No murmur, normal sinus rhythm.  PMI in fifth IS and MCL.  ABDOMEN:  Soft, obese, nontender.  No masses or organomegaly.  EXTREMITIES:  No edema.  No varices.  NEUROLOGIC:  DTRs within normal limits.  Babinskis negative.  GENITOURINARY:  External genitalia is normal.  Introitus is marital.  BUS  within normal limits.  The vagina is clean and well rugated.  The cervix is  clean and nulliparous in appearance.  The uterus and adnexa could not be  palpated because of the habitus of the patient.  RECTAL:  No masses.   IMPRESSION:  Symptomatic pelvic adhesions secondary to pelvic inflammatory  disease.   PLAN:  Total abdominal hysterectomy, possible bilateral salpingo-  oophorectomy.  We discussed with the patient in detail possible  complications especially those related to anesthetic as well as those  related to urinary and bowel injury, infection, and excessive bleeding or  hemorrhage both intraoperatively and postoperatively.  Also discussed the  possible complications associated with blood transfusion should that become  necessary.  The patient has been well informed that she is unable to have  any further children after this procedure.                                                Phil D. Okey Dupre, M.D.    PDR/MEDQ  D:  11/16/2003  T:  11/16/2003  Job:  [id number]

## 2010-09-21 NOTE — Op Note (Signed)
[facility]  Patient:    [name], [name] Visit Number: [id number] MRN: [id number]          Service Type: OBS Location: 910A 9112 01 Attending Physician:  Leonard Schwartz Dictated by:   Pierre Bali Normand Sloop, M.D. Proc. Date: 12/29/00 Adm. Date:  12/29/2000                             Operative Report  INCOMPLETE REPORT  PREOPERATIVE DIAGNOSES:       1. Intrauterine pregnancy at term.                               2. Elective repeat cesarean section.                               3. Desires sterilization.  POSTOPERATIVE DIAGNOSES:      1. Intrauterine pregnancy at term.                               2. Elective repeat cesarean section.                               3. Desires sterilization.  PROCEDURE:                    1. Repeat cesarean section.                               2. Bilateral tubal ligation.  SURGEON:                      Naima A. Normand Sloop, M.D.  ASSISTANT:                    Janine Limbo, M.D.  ANESTHESIA:                   Epidural.  ESTIMATED BLOOD LOSS:         700 cc.  INTRAVENOUS FLUIDS:           3000 cc.  URINE OUTPUT:                 350 cc, clear urine.  FINDINGS:                     Female infant, vertex presentation with Apgars of 8 and 9. Normal uterus, tubes, and ovaries.  COMPLICATIONS:                None.  DISPOSITION:                  Patient went to recovery room in stable condition.  PROCEDURE IN DETAIL:          The patient was taken to the operating room. She was given epidural anesthesia. She was placed in dorsal supine position with a left lateral tilt. A Pfannenstiel skin incision was then made 2 cm below the symphysis pubis and carried down to the fascia with the Bovie. The fascia was then incised in the midline and extended laterally using Mayo scissors. The fascia was then dissected off the rectus muscles both  superiorly and inferiorly both bluntly and with Bovie cautery. The  rectus muscles were separated in the midline. The peritoneum was identified, tented up, and entered sharply with Metzenbaum scissors and extended superiorly and inferiorly with good visualization of bowel and bladder. The bladder blade was inserted. The vesicouterine peritoneum was identified, tented up, and cut. The bladder flap was then created using Metzenbaum scissors and extended laterally. The bladder blade was then reinserted. A low transverse uterine inc was made on the uterus and extended with bandage scissors. The infant was then delivered without difficulty and handed to pediatrics in attendance. The cord was clamped and cut. The placenta was removed. The uterus remained in the abdomen. Dictated by:   Pierre Bali. Normand Sloop, M.D. Attending Physician:  Leonard Schwartz DD:  12/29/00 TD:  12/29/00 Job: [id number]

## 2010-10-17 ENCOUNTER — Other Ambulatory Visit: Payer: Self-pay | Admitting: Obstetrics & Gynecology

## 2010-10-17 ENCOUNTER — Ambulatory Visit: Payer: BC Managed Care – PPO | Admitting: Obstetrics & Gynecology

## 2010-10-17 DIAGNOSIS — R102 Pelvic and perineal pain: Secondary | ICD-10-CM

## 2010-10-17 DIAGNOSIS — N949 Unspecified condition associated with female genital organs and menstrual cycle: Secondary | ICD-10-CM

## 2010-10-17 LAB — POCT URINALYSIS DIP (DEVICE)
Bilirubin Urine: NEGATIVE
Glucose, UA: NEGATIVE mg/dL
Hgb urine dipstick: NEGATIVE
Ketones, ur: NEGATIVE mg/dL
Leukocytes, UA: NEGATIVE
Nitrite: NEGATIVE
Protein, ur: NEGATIVE mg/dL
Specific Gravity, Urine: 1.02 (ref 1.005–1.030)
Urobilinogen, UA: 0.2 mg/dL (ref 0.0–1.0)
pH: 5.5 (ref 5.0–8.0)

## 2010-10-18 ENCOUNTER — Ambulatory Visit (HOSPITAL_COMMUNITY)
Admission: RE | Admit: 2010-10-18 | Discharge: 2010-10-18 | Disposition: A | Discharge status: Home / Self Care | Payer: BC Managed Care – PPO | Source: Ambulatory Visit | Attending: Obstetrics & Gynecology | Admitting: Obstetrics & Gynecology

## 2010-10-18 ENCOUNTER — Ambulatory Visit (HOSPITAL_COMMUNITY): Payer: BC Managed Care – PPO

## 2010-10-18 DIAGNOSIS — N949 Unspecified condition associated with female genital organs and menstrual cycle: Secondary | ICD-10-CM | POA: Insufficient documentation

## 2010-10-18 DIAGNOSIS — N83209 Unspecified ovarian cyst, unspecified side: Secondary | ICD-10-CM | POA: Insufficient documentation

## 2010-10-18 DIAGNOSIS — R102 Pelvic and perineal pain: Secondary | ICD-10-CM

## 2010-10-18 NOTE — Group Therapy Note (Unsigned)
NAMESEVILLE, BRICK NO.:  [id number]  MEDICAL RECORD NO.:  [id number]           PATIENT TYPE:  A  LOCATION:  [facility]                   FACILITY:  [facility]  PHYSICIAN:  Lucina Mellow, DO   DATE OF BIRTH:  [birth year]  DATE OF SERVICE:  10/17/2010                                 CLINIC NOTE  The patient presents with a chief complaint of pelvic pain, back pain, hot flashes, night sweats, nervousness, breast tenderness, feels faint at times.  HISTORY OF PRESENT ILLNESS:  The patient is a 35 year old gravida 2, para 2 who had a hysterectomy by Dr. Okey Dupre in 2005 at which time her right tube was also removed.  The patient had a significant history of pelvic inflammatory disease and had many adhesions in her abdomen at the time of the hysterectomy that Dr. Okey Dupre had done.  I was able to review the op report and noted that there were many adhesions and she ended up also having a wound infection, a seroma that was also treated.  The patient states that after the hysterectomy, some of her pain was relieved but then she presented to Dr. __________ in [place] at which time he did a laparoscopy and removed her left ovary in 2010. Again I reviewed the op report and noted that there was a hemorrhagic cyst of the left ovary that was noted at the time of hysterectomy in 2005 that was drained and bleeding was controlled.  In 2010, the laparoscopy was noted to take longer than expected and the patient states that some of her pain was relieved after that surgery but now her pain is progressively presenting itself again.  She feels like her pain is mostly on the right and she states "I wish they just would have taken out my right ovary as well."  She states that she would like to have evaluation to have her right ovary removed and perhaps lysis of adhesions again.  She also states that she has urinary frequency and some urinary symptoms.  She did present and was seen by  urologist after seen in 2009 by Dr. Okey Dupre when he made the recommendation for her to see a urologist.  She states that she was just told she had an overactive bladder.  She takes no medications for this at this time.  The last time the patient was seen in our clinic was in 2009.  The patient states that she was going to [facility] but she decided to quit going to them because she felt like they were giving her the run-around.  She states that they were going to give her a shot to stop the functioning of her right ovary to determine if this would relieve any of her pain. The patient states that she has not had any sex drive and has not had intercourse in several years.  She was separated from her husband for several years and then subsequently got officially divorced a couple of months ago.  The patient continues to work despite the fact that she has pain.  She presented to her primary doctor 2 days ago because her back pain was  so bad she could not take it any more.  She says they gave her a Toradol shot and some oral tramadol and this gave her no relief at all and she has opted not to take it any more.  In the past she was given Vicodin for her pain but it stopped working shortly after she started taking it.  She takes over-the-counter Motrin at this time for her pain and it barely touches her pain.  PAST MEDICAL HISTORY:  Pelvic inflammatory disease, adhesions, overactive bladder.  GYNECOLOGICAL HISTORY:  History of an abnormal Pap smear in 2004, hysterectomy in 2005.  OBSTETRICAL HISTORY:  She is a gravida 2, para 2, history of 2 cesarean sections.  SURGICAL HISTORY:  Two cesarean sections, one with a tubal ligation, a total abdominal hysterectomy with a right salpingectomy in 2005, LSO in 2009, tonsils and adenoids as a child, cholecystectomy, ankle surgery, calf surgery, and leg surgery in 2009, 2010, and 2011.  ALLERGIES:  None.  CURRENT MEDICATIONS: 1. Nexium 40  mg daily. 2. Motrin as needed and MiraLax as needed.  PHYSICAL EXAMINATION:  VITAL SIGNS:  Blood pressure is 125/88, pulse is 90, temperature 98.9, weight 201 pounds, height is 65 inches. GENERAL:  The patient is a pleasant African American female who looks age 33.  She does not to be in any acute distress but she does look somewhat flat in her affect. HEART:  Regular rate and rhythm with no audible murmurs. LUNGS:  Clear to auscultation bilaterally.  Thyroid feels very slightly enlarged.  No obvious nodules or asymmetry palpated. ABDOMEN:  Soft with positive bowel sounds, tenderness to palpation globally. GYN:  External vaginal tissue looks normal with appropriate amount of hair in distribution, appropriate amount of moisture noted on speculum exam.  There are appropriate amount of rugae and moisture inside the vagina.  The cuff is recognized.  There is no bleeding or discharge appreciated.  Bimanual exam, slight tenderness on the left but increased tenderness with reproduction of symptoms when it was palpated to try to reach the right ovary.  Posteriorly when the bladder is touched with a bimanual exam, this also reproduces some of the patient's pain.  ASSESSMENT: 1. Pelvic pain.  The patient has had a hysterectomy with left ovary     subsequently 5 years after.  The patient desires to have her right     ovary removed.  We will obtain an ultrasound of her right ovary     compared to the ultrasound that she states she got when she was     being seen at [facility] and compare the results.  I have     already spoken to Dr. Marice Potter about a possible surgical consultation     after the results were obtained and she agrees with the plan.  The     patient is aware of coming back to see me and discuss the results     and further evaluation by a surgeon after we get the results and we     have all the information in hand.  In the meantime, I will try     Percocet 5/325 as needed for pain.   If this controls her pain and     she follows up with me in 2 weeks and she would like to continue     this, we can continue this until perhaps we can have the surgery     with lysis of adhesions and removal of the right ovary.  2. Weight loss.  The patient states that she weighed 210 pounds since     she was seen by her primary care physician on Monday.  Said today 2     days later, she weighs 201 pounds.  I would like to get thyroid     function studies on her as well as a complete metabolic panel and     CBC to evaluate if there is any other additional medical needs that     we would need to address. 3. Menopausal type symptoms.  The patient states a feeling kind of     weak and faint as well as having some palpitations and jitteriness     and hot flashes and night sweats.  These all may be related to     premature ovarian failure.  We will try to treat these symptoms     with Effexor XL 37.5 mg once a day.  When I follow her up in 2     weeks for her other results, we can discuss whether or not this is     effective and whether or not she would like to continue this or if     she would like to try something else. 4. Urinary symptoms.  We obtained a UA in clinic today which is normal     with no nitrites, no leukocytes, no blood, and we will get a     culture to ensure that we do not need to treat an infection.          ______________________________ Lucina Mellow, DO    SH/MEDQ  D:  10/17/2010  T:  10/18/2010  Job:  [id number]

## 2010-10-19 ENCOUNTER — Other Ambulatory Visit: Payer: Self-pay | Admitting: Internal Medicine

## 2010-10-19 DIAGNOSIS — M549 Dorsalgia, unspecified: Secondary | ICD-10-CM

## 2010-10-22 ENCOUNTER — Ambulatory Visit
Admission: RE | Admit: 2010-10-22 | Discharge: 2010-10-22 | Disposition: A | Discharge status: Home / Self Care | Payer: BC Managed Care – PPO | Source: Ambulatory Visit | Attending: Internal Medicine | Admitting: Internal Medicine

## 2010-10-22 DIAGNOSIS — M549 Dorsalgia, unspecified: Secondary | ICD-10-CM

## 2010-10-29 ENCOUNTER — Ambulatory Visit (INDEPENDENT_AMBULATORY_CARE_PROVIDER_SITE_OTHER): Payer: BC Managed Care – PPO | Admitting: Family Medicine

## 2010-10-29 ENCOUNTER — Other Ambulatory Visit: Payer: Self-pay | Admitting: Obstetrics and Gynecology

## 2010-10-29 DIAGNOSIS — N83209 Unspecified ovarian cyst, unspecified side: Secondary | ICD-10-CM

## 2010-10-29 DIAGNOSIS — N949 Unspecified condition associated with female genital organs and menstrual cycle: Secondary | ICD-10-CM

## 2010-10-30 NOTE — Group Therapy Note (Signed)
[name], [name] NO.:  [id number]  MEDICAL RECORD NO.:  [id number]           PATIENT TYPE:  A  LOCATION:  [facility]                   FACILITY:  [facility]  PHYSICIAN:  Maryelizabeth Kaufmann, MD  DATE OF BIRTH:  02-08-1976  DATE OF SERVICE:                                 CLINIC NOTE  CHIEF COMPLAINT:  Followup results.  HISTORY OF PRESENT ILLNESS:  A 35 year old gravida 2, para 2, status post hysterectomy and right BSO presenting for followup of her results. She complained of pelvic pain, so we checked an ultrasound upon her last visit, which showed a left ovary for about 3.3 x 2.5 for the single unilocular complex cyst measuring 1 x 1 cm, possible for a small hemorrhagic cyst or small endometrioma.  Recommended to follow up in 8 weeks for reassessment.  The patient continues to have an upper quadrant abdominal pain, states that she has got the Percocet, which did help, did not make her nauseated.  She has not filled the Effexor for her hot flashes due to financial reasons.  The patient is otherwise clinically stable.  Physical exam was not performed today.  ASSESSMENT AND PLAN:  This is a 35 year old G2, P2, status post hysterectomy and right bilateral salpingo-oophorectomy, complaining of pelvic pain with a left ovarian cyst.  We will recheck the ultrasound in 8 weeks and have the patient return to clinic in 10 weeks to follow up of those results with suggestion to see if they want to do any surgical evaluation on that regard.  We did refill the Percocet one tablet q.6 h. p.r.n. #90, no refills and we will also order Zofran 8 mg for DT, #90, one tablet q.8 h. p.r.n.          ______________________________ Maryelizabeth Kaufmann, MD    LC/MEDQ  D:  10/29/2010  T:  10/30/2010  Job:  [id number]

## 2010-11-27 ENCOUNTER — Other Ambulatory Visit: Payer: Self-pay | Admitting: Obstetrics & Gynecology

## 2010-11-27 ENCOUNTER — Telehealth: Payer: Self-pay

## 2010-11-27 MED ORDER — HYDROCODONE-ACETAMINOPHEN 5-500 MG PO TABS: 2.0000 | ORAL_TABLET | Freq: Four times a day (QID) | ORAL | Status: DC | PRN

## 2010-11-27 NOTE — Telephone Encounter (Signed)
Rx for Vicodin ordered.  Please call in Rx.

## 2010-11-27 NOTE — Telephone Encounter (Signed)
Called pt and new Rx to [facility] at [phone]

## 2010-11-27 NOTE — Telephone Encounter (Signed)
Called pt and pt informed me that she needed a Rx on pain med.  She currently is taking percocet in which she no longer wants to take due to making her "stomach feel upset".  I asked her if she was taking it with food pt stated "yes I always eat something when I take it".  I informed pt that we will need to consult with a provider to change her Rx for pain.  And that I would call her today before noon.  Pt stated understanding.

## 2010-12-06 ENCOUNTER — Telehealth: Payer: Self-pay | Admitting: Obstetrics and Gynecology

## 2010-12-06 ENCOUNTER — Other Ambulatory Visit: Payer: Self-pay | Admitting: Obstetrics and Gynecology

## 2010-12-06 ENCOUNTER — Ambulatory Visit (HOSPITAL_COMMUNITY)
Admission: RE | Admit: 2010-12-06 | Discharge: 2010-12-06 | Disposition: A | Discharge status: Home / Self Care | Payer: BC Managed Care – PPO | Source: Ambulatory Visit | Attending: Obstetrics and Gynecology | Admitting: Obstetrics and Gynecology

## 2010-12-06 DIAGNOSIS — N83209 Unspecified ovarian cyst, unspecified side: Secondary | ICD-10-CM

## 2010-12-06 DIAGNOSIS — Z90710 Acquired absence of both cervix and uterus: Secondary | ICD-10-CM | POA: Insufficient documentation

## 2010-12-06 MED ORDER — OXYCODONE-ACETAMINOPHEN 5-325 MG PO TABS: 1.0000 | ORAL_TABLET | Freq: Four times a day (QID) | ORAL | Status: DC | PRN

## 2010-12-06 NOTE — Telephone Encounter (Signed)
Patient seen in 10/2010 for evaluation of pelvic pain and radiology demonstrated presence of a left ovarian cyst. Patient had follow-up ultrasound appointment today which demonstrated the presence of a left ovarian cyst (official report to follow). Prescription for 20 tabs of percocet (5/325) given. Patient to be seen at follow up appointment for further evaluation

## 2010-12-06 NOTE — Telephone Encounter (Signed)
Pt called  Today at 2:02 and left message . States she wants to talk to a Production designer, theatre/television/film -" tried to get a prescription filled of same medicine as I  got last time and they gave me the same medicine I got the first time that made me sick and they will not give me the same medicine as last time week ago or week before that.  All I am saying is I want to talk to someover the clinic"

## 2010-12-06 NOTE — Telephone Encounter (Signed)
Left message to call back. Patient left message in regard of a Rx refill question.

## 2010-12-13 ENCOUNTER — Other Ambulatory Visit: Payer: Self-pay | Admitting: *Deleted

## 2010-12-13 ENCOUNTER — Other Ambulatory Visit: Payer: Self-pay

## 2010-12-13 MED ORDER — OXYCODONE-ACETAMINOPHEN 5-325 MG PO TABS: 1.0000 | ORAL_TABLET | Freq: Four times a day (QID) | ORAL | Status: DC | PRN

## 2010-12-13 NOTE — Telephone Encounter (Signed)
Pt has already been contacted by Ralene Bathe LPN for med refill. No further action needed

## 2010-12-13 NOTE — Telephone Encounter (Signed)
Pt called and pt informed me that she needed at refill on pain med.  Her appt is not until 12/27/10 for follow up.  Per Dr. Orvan Falconer pt can get a refill on percocet 5/325 until her appt on 12/27/10.  Pt was also informed that due to the Rx being a narcotic that she will have to come here to pick up Rx by 4pm today.  Pt stated understanding and Rx reordered.

## 2010-12-18 DIAGNOSIS — R102 Pelvic and perineal pain: Secondary | ICD-10-CM | POA: Insufficient documentation

## 2010-12-18 DIAGNOSIS — N730 Acute parametritis and pelvic cellulitis: Secondary | ICD-10-CM | POA: Insufficient documentation

## 2010-12-27 ENCOUNTER — Encounter: Payer: Self-pay | Admitting: Family Medicine

## 2010-12-27 ENCOUNTER — Ambulatory Visit (INDEPENDENT_AMBULATORY_CARE_PROVIDER_SITE_OTHER): Payer: BC Managed Care – PPO | Admitting: Family Medicine

## 2010-12-27 VITALS — BP 127/84 | HR 73 | Temp 98.5°F | Ht 65.0 in | Wt 206.4 lb

## 2010-12-27 DIAGNOSIS — N949 Unspecified condition associated with female genital organs and menstrual cycle: Secondary | ICD-10-CM

## 2010-12-27 DIAGNOSIS — R102 Pelvic and perineal pain: Secondary | ICD-10-CM

## 2010-12-27 MED ORDER — HYDROCODONE-ACETAMINOPHEN 5-500 MG PO TABS: 1.0000 | ORAL_TABLET | Freq: Four times a day (QID) | ORAL | Status: DC | PRN

## 2010-12-27 NOTE — Progress Notes (Signed)
  Subjective:    Patient ID: [name], female    DOB: February 21, 1976, 35 y.o.   MRN: [id number]  Pelvic Pain The patient's pertinent negatives include no genital itching, genital lesions, missed menses or pelvic pain. This is a chronic problem. The current episode started more than 1 year ago. The problem has been gradually worsening. The pain is moderate. The problem affects the left side. She is not pregnant. Associated symptoms include abdominal pain. Pertinent negatives include no back pain, constipation, diarrhea, fever, headaches, hematuria, nausea, urgency or vomiting. The symptoms are aggravated by activity, bowel movements and heavy lifting. She has tried oral narcotics for the symptoms. The treatment provided mild relief. She is sexually active. No, her partner does not have an STD. She uses nothing for contraception. Her past medical history is significant for an abdominal surgery, a Cesarean section, endometriosis, a gynecological surgery and ovarian cysts.      Review of Systems  Constitutional: Negative for fever and activity change.  Gastrointestinal: Positive for abdominal pain and abdominal distention. Negative for nausea, vomiting, diarrhea, constipation and blood in stool.  Genitourinary: Negative for urgency, hematuria, pelvic pain and missed menses.  Musculoskeletal: Negative for back pain and arthralgias.  Neurological: Negative for dizziness and headaches.       Objective:   Physical Exam  Constitutional: She appears well-developed and well-nourished.  HENT:  Head: Normocephalic and atraumatic.  Eyes: Pupils are equal, round, and reactive to light.  Neck: Normal range of motion.  Cardiovascular: Normal rate and regular rhythm.   Pulmonary/Chest: Effort normal and breath sounds normal.  Abdominal: Soft. There is tenderness.  Genitourinary: Left adnexum displays tenderness and fullness.          Assessment & Plan:  Chronic pelvic pain in pt. With h/o  endometriosis s/p hyst and RSO.  She feels like her pain is similar to endometriosis pain previously. She desires LSO.  Will proceed with laparsocopic resection. Risks include but are not limited to bleeding, infection, injury to surrounding structures, including bowel, bladder and ureters, blood clots, and death.  Discussed that this may not alleviate pain. Refill Vicodin.

## 2010-12-27 NOTE — Patient Instructions (Signed)
Menopause Menopause is the normal time of life when menstrual periods stop completely. Menopause is complete when you have missed 12 consecutive menstrual periods. It usually occurs between the ages of 52-55, with an average age of 62. Very rarely does a woman develop menopause before 35 years old. At menopause, your ovaries stop producing the female hormones, estrogen and progesterone. This can cause undesirable symptoms and also affect your health. Sometimes the symptoms may occur 4-5 years before the menopause begins. There is no relationship between oral contraceptives, number of children you had, race or the age your menstrual periods started (menarche). Heavy smokers and very thin women may develop menopause earlier in life. CAUSE  The ovaries stop producing the female hormones estrogen and progesterone.   Other causes include:   Surgery to remove both ovaries.  The ovaries stop functioning for no known reason.   Tumors of the pituitary gland in the brain.   Medical disease that affects the ovaries and hormone production.  Radiation treatment to the abdomen or pelvis.   Chemotherapy that affects the ovaries.   SYMPTOMS  Hot flashes.  Night sweats.   Decrease in sex drive.   Vaginal dryness and shrinking of the size of the genital organs causing painful intercourse.   Dryness of the skin and developing wrinkles.  Headaches.   Tiredness.   Irritability.   Memory problems.  Weight gain.   Bladder infections.   Hair growth of the face and chest.   Infertility.   More serious symptoms include:  Loss of bone (osteoporosis) causing breaks (fractures).   Depression.   Hardening and narrowing of the arteries (atherosclerosis) causing heart attacks and strokes.  DIAGNOSIS  When the menstrual periods have stopped for 12 straight months.   Physical exam.   Hormone studies of the blood.  TREATMENT There are many treatment choices and nearly as many questions about  them. The decisions to treat or not to treat menopausal changes is an individual decision made with your caregiver. Your caregiver can discuss the treatments with you. Together, you can decide which treatment will work best for you such as:  Hormone replacement treatment.  Treat the individual symptoms with medication (ex. tranquilizer for depression).   Herbal medications that may help specific symptoms.  Counseling by a psychiatrist or psychologist.   Group therapy.   No treatment.   HOME CARE INSTRUCTIONS  Take the medication your caregiver gives you as directed.   Get plenty of sleep and rest.   Exercise regularly.   Eat a diet that contains calcium (good for the bones) and soy products (acts like estrogen hormone).   Avoid alcoholic beverages.   Do not smoke.   Taking vitamin E may help in certain cases.   If you have hot flashes, dress in layers.   Take supplements, calcium and vitamin D to strengthen bones.   You can use over-the-counter vaginal cream for vaginal dryness.   Group therapy is sometimes very helpful.   Acupuncture may be helpful in some cases.  SEEK MEDICAL CARE IF:  You are not sure you are in the menopause.   You are having menopausal symptoms and need advice and treatment.   You are still having menstrual periods after age 11.   You have pain with intercourse.   You are in the menopause (no menstrual period for 12 months) and develop vaginal bleeding.   You need a referral to a specialist (gynecologist, psychiatrist or psychologist) for treatment.  SEEK IMMEDIATE MEDICAL CARE  IF:  You have severe depression.   You have excessive vaginal bleeding.   You fell and think you have a broken bone.   You have pain when you urinate.   You develop leg or chest pain.   You have a fast pounding heart beat (palpitations).   You have severe headaches.   You develop vision problems.   You feel a lump in your breast.   You have abdominal  pain or severe indigestion.  Document Released: 07/13/2003 Document Re-Released: 02/23/2008 [facility] Patient Information 2011 [place], [place].Endometriosis Cramps, Pain and Infertility Up to ten per cent of women in child bearing years have endometriosis. Endometriosis is a disease that occurs when the endometrium (lining of the uterus) is misplaced outside of its normal location. It may occur in many locations close to the uterus (womb), but commonly on the ovaries, fallopian tubes, vagina (birth canal) and bowel located close to the uterus. Because the uterus sloughs (expels) its lining every month (menses), there is bleeding where ever the endometrial tissue is located. SYMPTOMS Often there are no symptoms (problems); however because blood is irritating to tissues not normally exposed to it, when symptoms occur they vary with the location of the misplaced endometrium. Symptoms often include back and abdominal (belly) pain. Periods may be heavier and intercourse may be painful. Infertility may be present. You may have all of these symptoms at one time or another or you may have months with no symptoms at all. Although the symptoms occur mainly during menses, they can occur mid-cycle as well, and usually terminate with menopause. DIAGNOSIS You will have to be seen by your caregiver for a diagnosis (learning what is wrong). Your caregiver may recommend a blood test and urine test (urinalysis) to help rule out other conditions. Another common test is ultrasound, a painless procedure that uses sound waves to make a sonogram "picture" of abnormal tissue that could be endometriosis. If your bowel movements are painful around your periods, your caregiver may advise a barium enema (an x-ray of the lower bowel), to try to find the source of your pain. This is sometimes confirmed by laparoscopy. Laparoscopy is a procedure where your caregiver looks into your abdomen with a laparoscope (a small pencil sized  telescope). Your caregiver may take a tiny piece of tissue (biopsy) from any abnormal tissue to confirm or document your problem. These tissues are sent to the lab and a pathologist looks at them under the microscope to give a microscopic diagnoses. TREATMENT Once the diagnosis is made, it can be treated by destruction of the misplaced endometrial tissue using heat (diathermy), laser, cutting (excision), or chemical means. It may also be treated with hormonal therapy. When using hormonal therapy menses are eliminated, therefore eliminating the monthly exposure to blood by the misplaced endometrial tissue. Only in severe cases is it necessary to perform a hysterectomy with removal of the tubes, uterus and ovaries. HOME CARE INSTRUCTIONS  Only take over-the-counter or prescription medicines for pain, discomfort, or fever as directed by your caregiver.   Avoid activities that produce pain, including a physical sexual relationship.   Do not take aspirin as this may increase bleeding when not on hormonal therapy.   See your caregiver for pain or problems not controlled with treatment.  SEEK IMMEDIATE MEDICAL CARE IF:  Your pain is severe and is not responding to pain medication.   You develop severe nausea and vomiting or can't keep foods down.   Your pain localizes to the right lower  part of your abdomen (possible appendicitis).   There is swelling or increasing pain in the abdomen.   An unexplained oral temperature above 102 F (38.9 C) develops, or as your caregiver suggests.   You see blood in your stool.  MAKE SURE YOU:   Understand these instructions.   Will watch your condition.   Will get help right away if you are not doing well or get worse.  Document Released: 04/19/2000 Document Re-Released: 04/04/2008 [facility] Patient Information 2011 [place], [place].Pelvic Pain Pelvic pain is pain below the belly button and located between your hips. Acute pain may last a few hours or  days. Chronic pelvic pain may last weeks and months. The cause these different types of pelvic may be different. The pain may be dull or sharp, mild or severe and can interfere with your daily activities. Write down and tell your caregiver:   Exactly where the pain is located.   If it comes and goes or is there all the time.   When it happens (with sex, urination, bowel movement, etc.)   If the pain is related to your menstrual period or stress.  Your caregiver will take a full history and do a complete physical exam and Pap test. CAUSES  Painful menstrual periods (dysmenorrhea).   Normal ovulation (Mittelschmertz) that occurs in the middle of the menstrual cycle every month.   The pelvic organs get engorged with blood just before the menstrual period (pelvic congestive syndrome).   Scar tissue from an infection or past surgery (pelvic adhesions).   Cancer of the female pelvic organs. When there is pain with cancer, it has been there for a long time.   The lining of the uterus (endometrium) abnormally grows in places like the pelvis and on the pelvic organs (endometriosis).   A form of endometriosis with the lining of the uterus present inside of the muscle tissue of the uterus (adenomyosis).   Fibroid tumor (noncancerous) in the uterus.   Bladder problems such as infection, bladder spasms of the muscle tissue of the bladder.   Intestinal problems (irritable bowel syndrome, colitis, an ulcer or gastrointestinal infection).   Polyps of the cervix or uterus.   Pregnancy in the tube (ectopic pregnancy).   The opening of the cervix is too small for the menstrual blood to flow through it (cervical stenosis).   Physical or sexual abuse (past or present).   Musculo-skeletal problems from poor posture, problems with the vertebrae of the lower back or the uterine pelvic muscles falling (prolapse).   Psychological problems such as depression or stress.   IUD (intrauterine device) in  the uterus.  DIAGNOSIS Tests to make a diagnosis depends on the type, location, severity and what causes the pain to occur. Tests that may be needed include:  Blood tests.  Urine tests   Ultrasound.   X-rays.  CT Scan.   MRI.  Laparoscopy.   Major surgery.   TREATMENT Treatment will depend on the cause of the pain, which includes:  Prescription or over-the-counter pain medication.  Antibiotics.   Birth control pills.   Hormone treatment.  Nerve blocking injections.   Physical therapy.   Antidepressants.  Counseling with a psychiatrist or psychologist.   Minor or major surgery.   HOME CARE INSTRUCTIONS  Only take over-the-counter or prescription medicines for pain, discomfort or fever as directed by your caregiver.   Follow your caregiver's advice to treat your pain.   Rest.   Avoid sexual intercourse if it causes the  pain.   Apply warm or cold compresses (which ever works best) to the pain area.   Do relaxation exercises such as yoga or meditation.   Try acupuncture.   Avoid stressful situations.   Try group therapy.   If the pain is because of a stomach/intestinal upset, drink clear liquids, eat a bland light food diet until the symptoms go away.  SEEK MEDICAL CARE IF:  You need stronger prescription pain medication.   You develop pain with sexual intercourse.   You have pain with urination.   You develop a temperature of 101 with the pain.   You are still in pain after 4 hours of taking prescription medication for the pain.   You need depression medication.   Your IUD is causing pain and you want it removed.  SEEK IMMEDIATE MEDICAL CARE IF YOU DEVELOP:  Very severe pain or tenderness.   Fainting, chills, severe weakness or dehydration.   Heavy vaginal bleeding or passing solid tissue.   You develop a temperature of 101 with the pain.   You have blood in the urine.   You are being physically or sexually abused.   You have  uncontrolled vomiting and diarrhea.   You are depressed and afraid of harming yourself or someone else.  Document Released: 05/30/2004 Document Re-Released: 02/23/2008 [facility] Patient Information 2011 [place], [place].

## 2010-12-27 NOTE — Progress Notes (Signed)
Pt is having a lot of pain. Feels like she did when she had endometriosis in the past.

## 2010-12-31 ENCOUNTER — Inpatient Hospital Stay (INDEPENDENT_AMBULATORY_CARE_PROVIDER_SITE_OTHER)
Admission: RE | Admit: 2010-12-31 | Discharge: 2010-12-31 | Disposition: A | Discharge status: Home / Self Care | Payer: BC Managed Care – PPO | Source: Ambulatory Visit | Attending: Emergency Medicine | Admitting: Emergency Medicine

## 2010-12-31 DIAGNOSIS — J019 Acute sinusitis, unspecified: Secondary | ICD-10-CM

## 2011-01-03 ENCOUNTER — Emergency Department (HOSPITAL_COMMUNITY)
Admission: EM | Admit: 2011-01-03 | Discharge: 2011-01-04 | Disposition: A | Discharge status: Home / Self Care | Payer: BC Managed Care – PPO | Attending: Emergency Medicine | Admitting: Emergency Medicine

## 2011-01-03 DIAGNOSIS — Z0389 Encounter for observation for other suspected diseases and conditions ruled out: Secondary | ICD-10-CM | POA: Insufficient documentation

## 2011-01-07 ENCOUNTER — Emergency Department (HOSPITAL_COMMUNITY)
Admission: EM | Admit: 2011-01-07 | Discharge: 2011-01-07 | Disposition: A | Discharge status: Home / Self Care | Payer: BC Managed Care – PPO | Attending: Emergency Medicine | Admitting: Emergency Medicine

## 2011-01-07 DIAGNOSIS — J329 Chronic sinusitis, unspecified: Secondary | ICD-10-CM | POA: Insufficient documentation

## 2011-01-07 DIAGNOSIS — J3489 Other specified disorders of nose and nasal sinuses: Secondary | ICD-10-CM | POA: Insufficient documentation

## 2011-01-11 ENCOUNTER — Encounter (HOSPITAL_COMMUNITY)
Admission: RE | Admit: 2011-01-11 | Discharge: 2011-01-11 | Disposition: A | Discharge status: Home / Self Care | Payer: BC Managed Care – PPO | Source: Ambulatory Visit | Attending: Family Medicine | Admitting: Family Medicine

## 2011-01-11 ENCOUNTER — Encounter (HOSPITAL_COMMUNITY): Payer: Self-pay

## 2011-01-11 HISTORY — DX: Gastro-esophageal reflux disease without esophagitis: K21.9

## 2011-01-11 LAB — CBC
HCT: 31.7 % — ABNORMAL LOW (ref 36.0–46.0)
Hemoglobin: 10 g/dL — ABNORMAL LOW (ref 12.0–15.0)
MCH: 24.7 pg — ABNORMAL LOW (ref 26.0–34.0)
MCHC: 31.5 g/dL (ref 30.0–36.0)
MCV: 78.3 fL (ref 78.0–100.0)
Platelets: 310 10*3/uL (ref 150–400)
RBC: 4.05 MIL/uL (ref 3.87–5.11)
RDW: 13.9 % (ref 11.5–15.5)
WBC: 5.5 10*3/uL (ref 4.0–10.5)

## 2011-01-11 LAB — SURGICAL PCR SCREEN
MRSA, PCR: NEGATIVE
Staphylococcus aureus: NEGATIVE

## 2011-01-11 NOTE — Patient Instructions (Addendum)
20 [name]  01/11/2011   Your procedure is scheduled on:  01/14/11  Enter through the Main Entrance of [facility] at 8 AM.  Pick up the phone at the desk and dial [phone].   Call this number if you have problems the morning of surgery: [phone]   Remember:   Do not eat food:After Midnight.  Do not drink clear liquids: After Midnight.  Take these medicines the morning of surgery with A SIP OF WATER: Nexium   Do not wear jewelry, make-up or nail polish.  Do not wear lotions, powders, or perfumes. You may wear deodorant.  Do not shave 48 hours prior to surgery.  Do not bring valuables to the hospital.  Contacts, dentures or bridgework may not be worn into surgery.  Leave suitcase in the car. After surgery it may be brought to your room.  For patients admitted to the hospital, checkout time is 11:00 AM the day of discharge.   Patients discharged the day of surgery will not be allowed to drive home.  Name and phone number of your driver: NA  Special Instructions: CHG Shower Use Special Wash: 1/2 bottle night before surgery and 1/2 bottle morning of surgery.   Please read over the following fact sheets that you were given: MRSA Information

## 2011-01-12 ENCOUNTER — Emergency Department (HOSPITAL_COMMUNITY)
Admission: EM | Admit: 2011-01-12 | Discharge: 2011-01-13 | Disposition: A | Discharge status: Home / Self Care | Payer: BC Managed Care – PPO | Attending: Emergency Medicine | Admitting: Emergency Medicine

## 2011-01-12 DIAGNOSIS — R5383 Other fatigue: Secondary | ICD-10-CM | POA: Insufficient documentation

## 2011-01-12 DIAGNOSIS — R0789 Other chest pain: Secondary | ICD-10-CM | POA: Insufficient documentation

## 2011-01-12 DIAGNOSIS — M542 Cervicalgia: Secondary | ICD-10-CM | POA: Insufficient documentation

## 2011-01-12 DIAGNOSIS — R51 Headache: Secondary | ICD-10-CM | POA: Insufficient documentation

## 2011-01-12 DIAGNOSIS — R209 Unspecified disturbances of skin sensation: Secondary | ICD-10-CM | POA: Insufficient documentation

## 2011-01-12 DIAGNOSIS — R1013 Epigastric pain: Secondary | ICD-10-CM | POA: Insufficient documentation

## 2011-01-12 DIAGNOSIS — R5381 Other malaise: Secondary | ICD-10-CM | POA: Insufficient documentation

## 2011-01-13 ENCOUNTER — Emergency Department (HOSPITAL_COMMUNITY): Payer: BC Managed Care – PPO

## 2011-01-13 LAB — POCT I-STAT TROPONIN I: Troponin i, poc: 0 ng/mL (ref 0.00–0.08)

## 2011-01-13 LAB — URINALYSIS, ROUTINE W REFLEX MICROSCOPIC
Bilirubin Urine: NEGATIVE
Glucose, UA: NEGATIVE mg/dL
Hgb urine dipstick: NEGATIVE
Ketones, ur: NEGATIVE mg/dL
Leukocytes, UA: NEGATIVE
Nitrite: NEGATIVE
Protein, ur: NEGATIVE mg/dL
Specific Gravity, Urine: 1.011 (ref 1.005–1.030)
Urobilinogen, UA: 0.2 mg/dL (ref 0.0–1.0)
pH: 6.5 (ref 5.0–8.0)

## 2011-01-13 LAB — POCT PREGNANCY, URINE: Preg Test, Ur: NEGATIVE

## 2011-01-14 ENCOUNTER — Ambulatory Visit (HOSPITAL_COMMUNITY)
Admission: RE | Admit: 2011-01-14 | Discharge: 2011-01-15 | Disposition: A | Discharge status: Home / Self Care | Payer: BC Managed Care – PPO | Source: Ambulatory Visit | Attending: Family Medicine | Admitting: Family Medicine

## 2011-01-14 ENCOUNTER — Encounter (HOSPITAL_COMMUNITY)
Admission: RE | Disposition: A | Discharge status: Home / Self Care | Payer: Self-pay | Source: Ambulatory Visit | Attending: Family Medicine

## 2011-01-14 ENCOUNTER — Other Ambulatory Visit: Payer: Self-pay | Admitting: Family Medicine

## 2011-01-14 ENCOUNTER — Encounter (HOSPITAL_COMMUNITY): Payer: Self-pay | Admitting: Family Medicine

## 2011-01-14 ENCOUNTER — Encounter (HOSPITAL_COMMUNITY): Payer: Self-pay | Admitting: Anesthesiology

## 2011-01-14 ENCOUNTER — Inpatient Hospital Stay (HOSPITAL_COMMUNITY): Payer: BC Managed Care – PPO | Admitting: Anesthesiology

## 2011-01-14 ENCOUNTER — Encounter (HOSPITAL_COMMUNITY): Payer: Self-pay

## 2011-01-14 DIAGNOSIS — IMO0002 Reserved for concepts with insufficient information to code with codable children: Secondary | ICD-10-CM | POA: Insufficient documentation

## 2011-01-14 DIAGNOSIS — R102 Pelvic and perineal pain: Secondary | ICD-10-CM

## 2011-01-14 DIAGNOSIS — N8300 Follicular cyst of ovary, unspecified side: Secondary | ICD-10-CM | POA: Insufficient documentation

## 2011-01-14 DIAGNOSIS — Y9210 Unspecified residential institution as the place of occurrence of the external cause: Secondary | ICD-10-CM | POA: Insufficient documentation

## 2011-01-14 DIAGNOSIS — N949 Unspecified condition associated with female genital organs and menstrual cycle: Principal | ICD-10-CM | POA: Insufficient documentation

## 2011-01-14 HISTORY — PX: BLADDER NECK RECONSTRUCTION: SHX1239

## 2011-01-14 HISTORY — PX: LAPAROSCOPIC SALPINGOOPHERECTOMY: SUR795

## 2011-01-14 SURGERY — LAPAROSCOPY OPERATIVE
Anesthesia: General | Site: Bladder | Wound class: Clean Contaminated

## 2011-01-14 MED ORDER — ONDANSETRON HCL 4 MG/2ML IJ SOLN: 4.0000 mg | Freq: Four times a day (QID) | INTRAMUSCULAR | Status: DC | PRN

## 2011-01-14 MED ORDER — HYDROMORPHONE HCL 1 MG/ML IJ SOLN
0.2500 mg | INTRAMUSCULAR | Status: DC | PRN
  Administered 2011-01-14: 1 mg via INTRAVENOUS
  Administered 2011-01-14 (×2): 0.5 mg via INTRAVENOUS

## 2011-01-14 MED ORDER — LACTATED RINGERS IV SOLN
INTRAVENOUS | Status: DC
  Administered 2011-01-14 (×4): via INTRAVENOUS

## 2011-01-14 MED ORDER — SODIUM CHLORIDE 0.9 % IJ SOLN: 9.0000 mL | INTRAMUSCULAR | Status: DC | PRN

## 2011-01-14 MED ORDER — KETOROLAC TROMETHAMINE 30 MG/ML IJ SOLN
30.0000 mg | Freq: Once | INTRAMUSCULAR | Status: AC
  Administered 2011-01-14: 30 mg via INTRAVENOUS

## 2011-01-14 MED ORDER — HYDROMORPHONE HCL 1 MG/ML IJ SOLN
INTRAMUSCULAR | Status: AC
  Administered 2011-01-14: 1 mg via INTRAVENOUS
  Filled 2011-01-14: qty 1

## 2011-01-14 MED ORDER — ZOLPIDEM TARTRATE 10 MG PO TABS: 10.0000 mg | ORAL_TABLET | Freq: Every evening | ORAL | Status: DC | PRN

## 2011-01-14 MED ORDER — PROMETHAZINE HCL 25 MG/ML IJ SOLN
12.5000 mg | INTRAMUSCULAR | Status: DC | PRN
  Administered 2011-01-14: 12.5 mg via INTRAVENOUS
  Filled 2011-01-14: qty 1

## 2011-01-14 MED ORDER — HYDROCODONE-ACETAMINOPHEN 5-325 MG PO TABS
ORAL_TABLET | ORAL | Status: AC
  Administered 2011-01-14: 1 via ORAL
  Filled 2011-01-14: qty 1

## 2011-01-14 MED ORDER — CEFAZOLIN SODIUM 1-5 GM-% IV SOLN
1.0000 g | INTRAVENOUS | Status: AC
  Administered 2011-01-14: 1 g via INTRAVENOUS

## 2011-01-14 MED ORDER — NALOXONE HCL 0.4 MG/ML IJ SOLN: 0.4000 mg | INTRAMUSCULAR | Status: DC | PRN

## 2011-01-14 MED ORDER — NEOSTIGMINE METHYLSULFATE 1 MG/ML IJ SOLN
INTRAMUSCULAR | Status: DC | PRN
  Administered 2011-01-14: 2 mg via INTRAMUSCULAR

## 2011-01-14 MED ORDER — DIPHENHYDRAMINE HCL 50 MG/ML IJ SOLN
12.5000 mg | Freq: Four times a day (QID) | INTRAMUSCULAR | Status: DC | PRN
  Administered 2011-01-14 – 2011-01-15 (×2): 12.5 mg via INTRAVENOUS
  Filled 2011-01-14 (×2): qty 1

## 2011-01-14 MED ORDER — FENTANYL CITRATE 0.05 MG/ML IJ SOLN
INTRAMUSCULAR | Status: AC
  Filled 2011-01-14: qty 5

## 2011-01-14 MED ORDER — FENTANYL CITRATE 0.05 MG/ML IJ SOLN
INTRAMUSCULAR | Status: DC | PRN
  Administered 2011-01-14: 50 ug via INTRAVENOUS
  Administered 2011-01-14: 150 ug via INTRAVENOUS
  Administered 2011-01-14 (×2): 100 ug via INTRAVENOUS

## 2011-01-14 MED ORDER — HYDROMORPHONE 0.3 MG/ML IV SOLN
INTRAVENOUS | Status: DC
  Administered 2011-01-14: 1.5 mg via INTRAVENOUS
  Administered 2011-01-14: 15:00:00 via INTRAVENOUS
  Administered 2011-01-14: 1.69 mg via INTRAVENOUS
  Administered 2011-01-15: 0.9 mg via INTRAVENOUS
  Administered 2011-01-15: 1.2 mg via INTRAVENOUS

## 2011-01-14 MED ORDER — KETOROLAC TROMETHAMINE 30 MG/ML IJ SOLN: 30.0000 mg | Freq: Four times a day (QID) | INTRAMUSCULAR | Status: DC

## 2011-01-14 MED ORDER — OXYCODONE-ACETAMINOPHEN 5-325 MG PO TABS
1.0000 | ORAL_TABLET | ORAL | Status: DC | PRN
  Administered 2011-01-15 (×2): 2 via ORAL
  Filled 2011-01-14 (×2): qty 2

## 2011-01-14 MED ORDER — FENTANYL CITRATE 0.05 MG/ML IJ SOLN
25.0000 ug | INTRAMUSCULAR | Status: DC | PRN
  Administered 2011-01-14 (×3): 50 ug via INTRAVENOUS

## 2011-01-14 MED ORDER — FENTANYL CITRATE 0.05 MG/ML IJ SOLN
INTRAMUSCULAR | Status: AC
  Filled 2011-01-14: qty 2

## 2011-01-14 MED ORDER — PROPOFOL 10 MG/ML IV EMUL
INTRAVENOUS | Status: DC | PRN
  Administered 2011-01-14: 200 mg via INTRAVENOUS

## 2011-01-14 MED ORDER — ONDANSETRON HCL 4 MG/2ML IJ SOLN
INTRAMUSCULAR | Status: DC | PRN
  Administered 2011-01-14: 4 mg via INTRAVENOUS

## 2011-01-14 MED ORDER — CIPROFLOXACIN HCL 500 MG PO TABS
500.0000 mg | ORAL_TABLET | Freq: Two times a day (BID) | ORAL | Status: DC
  Administered 2011-01-14 – 2011-01-15 (×2): 500 mg via ORAL
  Filled 2011-01-14 (×4): qty 1

## 2011-01-14 MED ORDER — PROPOFOL 10 MG/ML IV EMUL
INTRAVENOUS | Status: AC
  Filled 2011-01-14: qty 20

## 2011-01-14 MED ORDER — GLYCOPYRROLATE 0.2 MG/ML IJ SOLN
INTRAMUSCULAR | Status: DC | PRN
  Administered 2011-01-14: .6 mg via INTRAVENOUS

## 2011-01-14 MED ORDER — STERILE WATER FOR IRRIGATION IR SOLN
Status: DC | PRN
  Administered 2011-01-14: 1000 mL

## 2011-01-14 MED ORDER — HYDROCODONE-ACETAMINOPHEN 5-325 MG PO TABS
ORAL_TABLET | ORAL | Status: AC
  Filled 2011-01-14: qty 1

## 2011-01-14 MED ORDER — LACTATED RINGERS IR SOLN
Status: DC | PRN
  Administered 2011-01-14: 3000 mL

## 2011-01-14 MED ORDER — HYDROCODONE-ACETAMINOPHEN 5-325 MG PO TABS
1.0000 | ORAL_TABLET | Freq: Once | ORAL | Status: AC
Start: 2011-01-14 — End: 2011-01-14
  Administered 2011-01-14: 1 via ORAL

## 2011-01-14 MED ORDER — LACTATED RINGERS IV SOLN
INTRAVENOUS | Status: DC
  Administered 2011-01-14 – 2011-01-15 (×2): via INTRAVENOUS

## 2011-01-14 MED ORDER — MENTHOL 3 MG MT LOZG: 1.0000 | LOZENGE | OROMUCOSAL | Status: DC | PRN

## 2011-01-14 MED ORDER — DEXAMETHASONE SODIUM PHOSPHATE 10 MG/ML IJ SOLN
INTRAMUSCULAR | Status: AC
  Filled 2011-01-14: qty 1

## 2011-01-14 MED ORDER — KETOROLAC TROMETHAMINE 30 MG/ML IJ SOLN
INTRAMUSCULAR | Status: AC
  Administered 2011-01-14: 30 mg via INTRAVENOUS
  Filled 2011-01-14: qty 1

## 2011-01-14 MED ORDER — PANTOPRAZOLE SODIUM 40 MG PO TBEC
40.0000 mg | DELAYED_RELEASE_TABLET | Freq: Every day | ORAL | Status: DC
  Administered 2011-01-15: 40 mg via ORAL
  Filled 2011-01-14 (×3): qty 1

## 2011-01-14 MED ORDER — ROCURONIUM BROMIDE 100 MG/10ML IV SOLN
INTRAVENOUS | Status: DC | PRN
  Administered 2011-01-14: 10 mg via INTRAVENOUS
  Administered 2011-01-14: 40 mg via INTRAVENOUS

## 2011-01-14 MED ORDER — ROCURONIUM BROMIDE 50 MG/5ML IV SOLN
INTRAVENOUS | Status: AC
  Filled 2011-01-14: qty 1

## 2011-01-14 MED ORDER — KETOROLAC TROMETHAMINE 30 MG/ML IJ SOLN
30.0000 mg | Freq: Four times a day (QID) | INTRAMUSCULAR | Status: DC
  Administered 2011-01-14 – 2011-01-15 (×3): 30 mg via INTRAVENOUS
  Filled 2011-01-14 (×3): qty 1

## 2011-01-14 MED ORDER — LIDOCAINE HCL (CARDIAC) 20 MG/ML IV SOLN
INTRAVENOUS | Status: DC | PRN
  Administered 2011-01-14: 100 mg via INTRAVENOUS

## 2011-01-14 MED ORDER — MIDAZOLAM HCL 2 MG/2ML IJ SOLN
INTRAMUSCULAR | Status: AC
  Filled 2011-01-14: qty 2

## 2011-01-14 MED ORDER — DIPHENHYDRAMINE HCL 12.5 MG/5ML PO ELIX: 12.5000 mg | ORAL_SOLUTION | Freq: Four times a day (QID) | ORAL | Status: DC | PRN

## 2011-01-14 MED ORDER — BISACODYL 10 MG RE SUPP: 10.0000 mg | Freq: Every day | RECTAL | Status: DC | PRN

## 2011-01-14 MED ORDER — DEXAMETHASONE SODIUM PHOSPHATE 4 MG/ML IJ SOLN
INTRAMUSCULAR | Status: DC | PRN
  Administered 2011-01-14: 10 mg via INTRAVENOUS

## 2011-01-14 MED ORDER — ONDANSETRON HCL 4 MG/2ML IJ SOLN
INTRAMUSCULAR | Status: AC
  Filled 2011-01-14: qty 2

## 2011-01-14 MED ORDER — LIDOCAINE HCL (CARDIAC) 20 MG/ML IV SOLN
INTRAVENOUS | Status: AC
Start: 2011-01-14 — End: 2011-01-14
  Filled 2011-01-14: qty 5

## 2011-01-14 MED ORDER — FENTANYL CITRATE 0.05 MG/ML IJ SOLN
INTRAMUSCULAR | Status: AC
  Administered 2011-01-14: 50 ug via INTRAVENOUS
  Filled 2011-01-14: qty 2

## 2011-01-14 MED ORDER — HYDROMORPHONE 0.3 MG/ML IV SOLN
INTRAVENOUS | Status: AC
  Filled 2011-01-14: qty 25

## 2011-01-14 MED ORDER — IBUPROFEN 600 MG PO TABS: 600.0000 mg | ORAL_TABLET | Freq: Four times a day (QID) | ORAL | Status: DC | PRN

## 2011-01-14 MED ORDER — LORAZEPAM 1 MG PO TABS
1.0000 mg | ORAL_TABLET | Freq: Four times a day (QID) | ORAL | Status: DC | PRN
  Administered 2011-01-14: 1 mg via ORAL
  Filled 2011-01-14: qty 1

## 2011-01-14 MED ORDER — MIDAZOLAM HCL 5 MG/5ML IJ SOLN
INTRAMUSCULAR | Status: DC | PRN
  Administered 2011-01-14: 2 mg via INTRAVENOUS

## 2011-01-14 MED ORDER — BUPIVACAINE HCL (PF) 0.25 % IJ SOLN
INTRAMUSCULAR | Status: DC | PRN
  Administered 2011-01-14: 7 mL

## 2011-01-14 MED ORDER — HYDROMORPHONE HCL 1 MG/ML IJ SOLN
INTRAMUSCULAR | Status: AC
  Administered 2011-01-14: 0.5 mg via INTRAVENOUS
  Filled 2011-01-14: qty 1

## 2011-01-14 MED ORDER — HYDROCODONE-ACETAMINOPHEN 5-325 MG PO TABS: 1.0000 | ORAL_TABLET | Freq: Once | ORAL | Status: DC

## 2011-01-14 SURGICAL SUPPLY — 39 items
BAG SPEC RTRVL LRG 6X4 10 (ENDOMECHANICALS) ×6
CABLE HIGH FREQUENCY MONO STRZ (ELECTRODE) IMPLANT
CATH ROBINSON RED A/P 16FR (CATHETERS) IMPLANT
CHLORAPREP W/TINT 26ML (MISCELLANEOUS) ×4 IMPLANT
CLOTH BEACON ORANGE TIMEOUT ST (SAFETY) ×4 IMPLANT
DEVICE SUTURE ENDOST 10MM (ENDOMECHANICALS) ×3 IMPLANT
DRAIN JACKSON PRT FLT 7MM (DRAIN) ×1 IMPLANT
DRAPE UTILITY XL STRL (DRAPES) ×4 IMPLANT
DRSG COVADERM 4X6 (GAUZE/BANDAGES/DRESSINGS) ×1 IMPLANT
ENDOSTITCH 0 SINGLE 48 (SUTURE) ×5 IMPLANT
EVACUATOR SILICONE 100CC (DRAIN) ×1 IMPLANT
EVACUATOR SMOKE 8.L (FILTER) ×1 IMPLANT
FORCEPS CUTTING 33CM 5MM (CUTTING FORCEPS) IMPLANT
FORCEPS CUTTING 45CM 5MM (CUTTING FORCEPS) IMPLANT
GLOVE BIOGEL PI IND STRL 7.0 (GLOVE) ×3 IMPLANT
GLOVE BIOGEL PI INDICATOR 7.0 (GLOVE) ×1
GLOVE ECLIPSE 7.0 STRL STRAW (GLOVE) ×8 IMPLANT
GOWN PREVENTION PLUS LG XLONG (DISPOSABLE) ×8 IMPLANT
GOWN PREVENTION PLUS XLARGE (GOWN DISPOSABLE) ×4 IMPLANT
NS IRRIG 1000ML POUR BTL (IV SOLUTION) ×4 IMPLANT
PACK LAPAROSCOPY BASIN (CUSTOM PROCEDURE TRAY) ×4 IMPLANT
POUCH SPECIMEN RETRIEVAL 10MM (ENDOMECHANICALS) ×2 IMPLANT
SCISSORS LAP 5X35 DISP (ENDOMECHANICALS) ×1 IMPLANT
SEALER TISSUE G2 CVD JAW 35 (ENDOMECHANICALS) IMPLANT
SEALER TISSUE G2 CVD JAW 45CM (ENDOMECHANICALS) ×1
SET CYSTO W/LG BORE CLAMP LF (SET/KITS/TRAYS/PACK) ×1 IMPLANT
SET IRRIG TUBING LAPAROSCOPIC (IRRIGATION / IRRIGATOR) ×1 IMPLANT
SUT VIC AB 2-0 SH 27 (SUTURE) ×4
SUT VIC AB 2-0 SH 27XBRD (SUTURE) IMPLANT
SUT VIC AB 3-0 X1 27 (SUTURE) ×4 IMPLANT
SUT VICRYL 0 UR6 27IN ABS (SUTURE) ×8 IMPLANT
SUT VICRYL 4-0 PS2 18IN ABS (SUTURE) ×3 IMPLANT
TOWEL OR 17X24 6PK STRL BLUE (TOWEL DISPOSABLE) ×8 IMPLANT
TRAY FOLEY CATH 14FR (SET/KITS/TRAYS/PACK) ×4 IMPLANT
TROCAR BALLN 12MMX100 BLUNT (TROCAR) ×1 IMPLANT
TROCAR Z-THREAD BLADED 11X100M (TROCAR) ×1 IMPLANT
TROCAR Z-THREAD BLADED 5X100MM (TROCAR) ×8 IMPLANT
WARMER LAPAROSCOPE (MISCELLANEOUS) ×4 IMPLANT
WATER STERILE IRR 1000ML POUR (IV SOLUTION) ×4 IMPLANT

## 2011-01-14 NOTE — Op Note (Signed)
[name], GOW NO.:  [id number]  MEDICAL RECORD NO.:  [id number]  LOCATION:  [facility]                         FACILITY:  [facility]  PHYSICIAN:  Jerilee Field, MD   DATE OF BIRTH:  09-03-1975  DATE OF PROCEDURE: DATE OF DISCHARGE:  01/13/2011                              OPERATIVE REPORT   PREOPERATIVE DIAGNOSES: 1. Chronic pelvic pain. 2. History of endometriosis. 3. Bladder injury/cystotomy.  POSTOPERATIVE DIAGNOSES: 1. Chronic pelvic pain. 2. History of endometriosis. 3. Bladder injury/cystotomy.  PROCEDURE:  Laparoscopic cystotomy repair and placement of Foley catheter.  SURGEON:  Jerilee Field, MD  ASSISTANT:  Shelbie Proctor. Shawnie Pons, MD and Horton Chin, MD  ANESTHESIA:  General.  INDICATIONS FOR PROCEDURE:  Ms. Stormes is a 35 year old female.  She has had history of endometriosis and multiple pelvic surgeries.  She was undergoing laparoscopic left salpingo-oophorectomy today and the left ovary was scarred to the bladder.  During dissection and removal of the left ovary, a cystotomy was made in the left dome of the bladder and Urology was consulted to assist in repair of the cystotomy.  FINDINGS:  Again there was a cystotomy several centimeters on in the left dome of the bladder.  Direct cystoscopy with the laparoscope revealed this cystotomy to be high on the posterior bladder on the left side.  The posterior bladder was intact.  The trigone was easily visualized as well as both ureteral orifices.  The patient was given indigo.  She had good efflux visualized from the left and the right ureteral orifice.  Dissection had been carried out into the bladder wall but no cystotomy in this location.  This part of the bladder was closed with a single figure-of-eight 0 Vicryl suture.  After closure of this area, direct cystoscopy was repeated and again good efflux was seen from the left and the right ureteral orifice prior to closure of  the cystotomy which again was much more superior toward the dome.  Upon closure of the cystotomy, the bladder was distended with 250 mL normal saline and noted to be watertight.  The Foley was then connected to gravity drainage, draining clear urine even as she left the OR.  DESCRIPTION OF PROCEDURE:  This was intraoperative consult with the patient already in surgery with the port near the umbilicus with the right lower quadrant and left lower quadrant port.  Again the cystotomy was in the left dome of the bladder.  It was in good position to close laparoscopically.  Therefore the Endo Stitch device was used with 0 Vicryl suture.  Full-thickness bites were obtained in a figure-of-eight fashion and then tied down with the suture tying device.  Sutures were tied extracorporeally and cinched down without difficulty.  Four interrupted figure-of-eight sutures were used to close the cystotomy. After cystotomy closure, I then removed her 14-French Foley and put in a new 18-French catheter with 10 mL in the balloon.  The balloon was then seated at the bladder neck.  The bladder was then distended with 250 mL normal saline and noted to be watertight.  The Foley was then left to gravity drainage, draining clear urine.  I then turned the case back over to  Dr. Shawnie Pons for specimen retrieval, port removal, and closure.  I shall leave the JP drain in place.  SPECIMENS:  None.  ESTIMATED BLOOD LOSS FOR THIS PORTION:  Zero.  COMPLICATIONS:  None.  DISPOSITION:  The patient returned back over to Dr. Shawnie Pons for the remainder of the case.          ______________________________ Jerilee Field, MD     ME/MEDQ  D:  01/14/2011  T:  01/14/2011  Job:  [id number]

## 2011-01-14 NOTE — Progress Notes (Signed)
Phone call to OR.  Spoke with Karleen Dolphin, CRNA re: pt's pain control issues.  Informed of pain meds given with dose amount and that pt's pain level continues to be 10.  Pt sobbing and writhing in bed despite pain meds.  Sharee Pimple to relay information to Dr. Hurley Cisco) and will call back.

## 2011-01-14 NOTE — Op Note (Signed)
Preoperative diagnosis: Pelvic pain  Postoperative diagnosis: Same Procedure: Operative laparoscopy with LSO, incidental cystotomy with repair Surgeon: Tinnie Gens, MD Assistant:  Jaynie Collins, MD Anesthesia: Brayton Caves, MD Findings: Normal appearing uterus, tubes, ovaries.  No evidence of endometriosis.  Normal anterior and posterior cul-de-sacs.  Normal liver edge, no significant adhesive disease. Estimated blood loss: Minimal Complications: None known Specimens: None Reason for procedure: Endometriosis, chronic pelvic pain with previous TAH, subsequent RSO and lysis of adhesions. Procedure: Patient was taken to the operating room was placed in dorsal lithotomy in [place] stirrups. She was prepped and draped in the usual sterile fashion. A timeout was performed. The patient received 1 g of Ancef and SCDs were in place. Foley catheter is used to drain bladder. Attention was then turned to the abdomen. Four cc of 0.25% Marcaine was injected at the umbilicus. Two Allis clamps were used to tent up the skin of the umbilicus a vertical one half centimeter incision was made here. The fascia was incised with the knife, and the peritoneum was entered sharply with this incision. Two edges of the fascia were tagged with a 0 Vicryl suture on a UR 6 needle. A Hassan trocar was placed through this incision and a pneumoperitoneum was created. The patient was then placed in Trendelenburg. The pelvis was inspected in a systematic fashion. The peritoneum had multiple white patches and areas of old scarring consistent with endometriosis. The upper abdomen was inspected the liver edge gallbladder and stomach appeared normal.  Right and left lower quadrant 5 mm ports were placed after injection with local under direct visualization. The left tube and ovary were found to be adherent to the left pelvic sidewall. There was  endometriosis to the underside of the bladder. The ovary was lifted up and the ureter was  identified. The IP was taken with the Enseal device. Further attempts at freeing up the ovary from the left sidewall and taking care to try to ensure that no ovarian remnants were left, a hole was noticed inside the bladder. Urology was consulted. The rest of the ovary was removed. A 5 mm camera was inserted and an Endo Catch bag used to try to remove the ovary through the umbilical port. We could not easily get this out a hole was made in the bag and ovaries and returned to the abdomen. The urologist came in and we decides laparoscopically close the bladder. Please see his dictation for for a full report of this part of the procedure. Once the bladder was closed and confirmed and a good seal was found, a JP drain was placed over the bladder. The end of the JP drain was brought out through the left 5 mm port. The right 5 mm port had previously been replaced with a 10 mm port so the Endo Stitch could be used to close the bladder. The Endo Catch bag was placed through this port and the ovary was removed in pieces. This port required fascial closure with the fascial closure device and a 0 Vicryl suture. The right trocar was removed under visualization. There was no excessive bleeding noted. The [facility] trocar was removed from the umbilicus. The fascia was closed at the umbilicus with the aforementioned 0 Vicryl suture on UR 6 with 2 figure-of-eights. The skin was closed with 3-0 Vicryl excellent anal subcuticular fashion at the right lower quadrant 10 mm port and the umbilicus. A 0 silk suture was used to sew the JP drain in the left lower quadrant port. All  instrument, needle  and lap counts were correct x 2. The patient was awakened to recovery in stable condition.

## 2011-01-14 NOTE — Progress Notes (Signed)
Phone call received from Dr. Shawnie Pons requesting update.  Informed that pt transferred to women's unit with pain controlled.

## 2011-01-14 NOTE — Anesthesia Postprocedure Evaluation (Signed)
Anesthesia Post Note  Patient: [name]  Procedure(s) Performed:  LAPAROSCOPY OPERATIVE; SALPINGO OOPHERECTOMY; BLADDER NECK REPAIR - Laparoscopic Repair of Incidental Cystotomy  Anesthesia type: General  Patient location: PACU  Post pain: Pain level controlled  Post assessment: Post-op Vital signs reviewed  Last Vitals:  Filed Vitals:   01/14/11 1438  BP: 117/72  Pulse: 81  Temp: 100 F (37.8 C)  Resp: 14    Post vital signs: Reviewed  Level of consciousness: sedated  Complications: No apparent anesthesia complications

## 2011-01-14 NOTE — Transfer of Care (Signed)
Immediate Anesthesia Transfer of Care Note  Patient: [name]  Procedure(s) Performed:  LAPAROSCOPY OPERATIVE; SALPINGO OOPHERECTOMY; BLADDER NECK REPAIR - Laparoscopic Repair of Incidental Cystotomy  Patient Location: PACU  Anesthesia Type: General  Level of Consciousness: awake, oriented and patient cooperative  Airway & Oxygen Therapy: Patient Spontanous Breathing and Patient connected to nasal cannula oxygen  Post-op Assessment: Report given to PACU RN and Post -op Vital signs reviewed and stable  Post vital signs: Reviewed and stable  Complications: No apparent anesthesia complications

## 2011-01-14 NOTE — Progress Notes (Signed)
Notified Dr. Shawnie Pons of pt's pain control issues.  No new orders received.  Also spoke with Dr.  Rodman Pickle. Order received from Dr. Rodman Pickle for Vicodin 2 tabs now.

## 2011-01-14 NOTE — Progress Notes (Signed)
Pt states her pain level is 5/10 during what she describes as a spasm and "less" afterward.  Visibly more comfortable.  No longer writhing in bed or crying.  Able to hold a conversation without difficulty.  Dr. Rodman Pickle at bedside.  Updated.  No new orders received.

## 2011-01-14 NOTE — H&P (Signed)
[name] is an 35 y.o. female. She has a long h/o pelvic pain and endometriosis who has prev. Hysterectomy and RSO, who now desires LSO for recurrent pain.  Pertinent Gynecological History: Menses: absent uterus Sexually transmitted diseases: no past history Previous GYN Procedures: TAH, LSO  OB History: G2, P2   Menstrual History:  Patient's last menstrual period was 12/27/2003.    Past Medical History  Diagnosis Date  . GERD (gastroesophageal reflux disease)     Past Surgical History  Procedure Date  . Abdominal hysterectomy   . Tonsillectomy and adenoidectomy     childhood  . Ankle surgery 2009  . Salpingectomy 2005    right  . Tubal ligation   . Right oophorectomy 2010  . Cesarean section     c-section x2    Family History  Problem Relation Age of Onset  . Diabetes Mother   . Hypertension Mother     Social History:  reports that she has never smoked. She has never used smokeless tobacco. She reports that she does not drink alcohol or use illicit drugs.  Allergies: No Known Allergies  Prescriptions prior to admission  Medication Sig Dispense Refill  . esomeprazole (NEXIUM) 40 MG capsule Take 40 mg by mouth daily.        [name] HYDROcodone-acetaminophen (VICODIN) 5-500 MG per tablet Take 1 tablet by mouth every 6 (six) hours as needed for pain.  48 tablet  1  . ibuprofen (ADVIL,MOTRIN) 200 MG tablet Take 800 mg by mouth every 6 (six) hours as needed. Prn pain      . polyethylene glycol (MIRALAX / GLYCOLAX) packet Take 17 g by mouth daily.        [name] zolpidem (AMBIEN CR) 12.5 MG CR tablet Take 12.5 mg by mouth at bedtime as needed.          Review of Systems  Constitutional: Negative for fever, chills and weight loss.  HENT: Negative for congestion.   Eyes: Negative for blurred vision and pain.  Respiratory: Negative for cough, shortness of breath and wheezing.   Cardiovascular: Negative for chest pain, palpitations and leg swelling.  Gastrointestinal:  Positive for abdominal pain. Negative for nausea, vomiting, diarrhea and constipation.  Genitourinary: Negative for dysuria and urgency.  Musculoskeletal: Positive for myalgias and back pain.  Neurological: Negative for dizziness, tingling and headaches.    Last menstrual period 12/27/2003. Physical Exam  Constitutional: She is oriented to person, place, and time. She appears well-developed and well-nourished.  HENT:  Head: Normocephalic and atraumatic.  Eyes: No scleral icterus.  Neck: Neck supple.  Cardiovascular: Normal rate and regular rhythm.   Respiratory: Effort normal and breath sounds normal.  GI: Soft. There is tenderness.  Genitourinary: Vagina normal.  Neurological: She is alert and oriented to person, place, and time.  Skin: Skin is warm and dry.  Psychiatric: She has a normal mood and affect.    No results found for this or any previous visit (from the past 24 hour(s)).  Dg Chest 2 View  01/13/2011  *RADIOLOGY REPORT*  Clinical Data: Chest pain for several weeks, worsening on the left side, with left arm numbness.  CHEST - 2 VIEW  Comparison: None.  Findings: The lungs are well-aerated and clear.  There is no evidence of focal opacification, pleural effusion or pneumothorax.  The heart is normal in size; the mediastinal contour is within normal limits.  No acute osseous abnormalities are seen.  Clips are noted within the right upper quadrant, reflecting  prior cholecystectomy.  IMPRESSION: No acute cardiopulmonary process seen.  Original Report Authenticated By: Tonia Ghent, M.D.   Dg Cervical Spine Complete  01/13/2011  *RADIOLOGY REPORT*  Clinical Data: Left arm numbness and chest pain.  CERVICAL SPINE - COMPLETE 4+ VIEW  Comparison: None.  Findings: There is no evidence of fracture or subluxation. Vertebral bodies demonstrate normal height and alignment. Intervertebral disc spaces are preserved.  Prevertebral soft tissues are within normal limits.  The provided odontoid  view demonstrates no significant abnormality.  The visualized lung apices are clear.  IMPRESSION: No evidence of fracture or subluxation along the cervical spine.  Original Report Authenticated By: Tonia Ghent, M.D.   Ct Head Wo Contrast  01/13/2011  *RADIOLOGY REPORT*  Clinical Data: Headache and left arm pain; blurred vision.  CT HEAD WITHOUT CONTRAST  Technique:  Contiguous axial images were obtained from the base of the skull through the vertex without contrast.  Comparison: None.  Findings: There is no evidence of acute infarction, mass lesion, or intra- or extra-axial hemorrhage on CT.  The posterior fossa, including the cerebellum, brainstem and fourth ventricle, is within normal limits.  The third and lateral ventricles, and basal ganglia are unremarkable in appearance.  The cerebral hemispheres are symmetric in appearance, with normal gray- white differentiation.  No mass effect or midline shift is seen.  There is no evidence of fracture; visualized osseous structures are unremarkable in appearance.  The visualized portions of the orbits are within normal limits.  The paranasal sinuses and mastoid air cells are well-aerated.  No significant soft tissue abnormalities are seen.  IMPRESSION: Unremarkable noncontrast CT of the head.  Original Report Authenticated By: Tonia Ghent, M.D.    Assessment/Plan: Chronic pelvic pain, h/o endometriosis with multiple previous surgeries.  Desires LSO. Risks include but are not limited to bleeding, infection, injury to surrounding structures, including bowel, bladder and ureters, blood clots, and death. Additionally, pt's pain may not be cured with this procedure.  Pt. Understands these risks and agrees to proceed.  PRATT,TANYA S 01/14/2011, 8:07 AM

## 2011-01-14 NOTE — Progress Notes (Signed)
Dr. Rodman Pickle at bedside.  Pt describing pain as spasm-like in her bladder.  Order received for additional Dilaudid 1 mg IV.  Dr. Rodman Pickle requests RN to call Dr. Shawnie Pons to update on condition.  Dr. Shawnie Pons paged.

## 2011-01-14 NOTE — Anesthesia Preprocedure Evaluation (Signed)
Anesthesia Evaluation  Name, MR# and DOB Patient awake  General Assessment Comment  Reviewed: Allergy & Precautions, H&P , Patient's Chart, lab work & pertinent test results, reviewed documented beta blocker date and time   History of Anesthesia Complications Negative for: history of anesthetic complications  Airway Mallampati: II TM Distance: >3 FB Neck ROM: full    Dental No notable dental hx.    Pulmonary  clear to auscultation  pulmonary exam normalPulmonary Exam Normal breath sounds clear to auscultation none    Cardiovascular Exercise Tolerance: Good regular Normal    Neuro/Psych Negative Neurological ROS  Negative Psych ROS  GI/Hepatic/Renal negative GI ROS  negative Liver ROS  negative Renal ROS   GERD Controlled     Endo/Other  Negative Endocrine ROS (+)      Abdominal   Musculoskeletal   Hematology negative hematology ROS (+)   Peds  Reproductive/Obstetrics negative OB ROS    Anesthesia Other Findings             Anesthesia Physical Anesthesia Plan  ASA: II  Anesthesia Plan: General   Post-op Pain Management:    Induction: Intravenous  Airway Management Planned: Oral ETT  Additional Equipment:   Intra-op Plan:   Post-operative Plan:   Informed Consent: I have reviewed the patients History and Physical, chart, labs and discussed the procedure including the risks, benefits and alternatives for the proposed anesthesia with the patient or authorized representative who has indicated his/her understanding and acceptance.   Dental Advisory Given  Plan Discussed with: CRNA and Surgeon  Anesthesia Plan Comments:         Anesthesia Quick Evaluation

## 2011-01-14 NOTE — Anesthesia Procedure Notes (Addendum)
Procedure Name: Intubation Date/Time: 01/14/2011 9:26 AM Performed by: Isabella Bowens Pre-anesthesia Checklist: Patient identified, Emergency Drugs available, Suction available, Patient being monitored and Timeout performed Patient Re-evaluated:Patient Re-evaluated prior to inductionOxygen Delivery Method: Circle System Utilized Preoxygenation: Pre-oxygenation with 100% oxygen Intubation Type: IV induction Laryngoscope Size: Mac and 3 Grade View: Grade II Tube type: Oral Tube size: 7.0 mm Number of attempts: 1 Airway Equipment and Method: stylet Placement Confirmation: ETT inserted through vocal cords under direct vision,  positive ETCO2 and breath sounds checked- equal and bilateral Secured at: 21 cm Tube secured with: Tape Dental Injury: Teeth and Oropharynx as per pre-operative assessment

## 2011-01-15 LAB — CBC
HCT: 28.9 % — ABNORMAL LOW (ref 36.0–46.0)
Hemoglobin: 9.3 g/dL — ABNORMAL LOW (ref 12.0–15.0)
MCH: 25.2 pg — ABNORMAL LOW (ref 26.0–34.0)
MCHC: 32.2 g/dL (ref 30.0–36.0)
MCV: 78.3 fL (ref 78.0–100.0)
Platelets: 268 10*3/uL (ref 150–400)
RBC: 3.69 MIL/uL — ABNORMAL LOW (ref 3.87–5.11)
RDW: 13.9 % (ref 11.5–15.5)
WBC: 13.3 10*3/uL — ABNORMAL HIGH (ref 4.0–10.5)

## 2011-01-15 MED ORDER — OXYCODONE-ACETAMINOPHEN 5-325 MG PO TABS: 1.0000 | ORAL_TABLET | Freq: Four times a day (QID) | ORAL | Status: AC | PRN

## 2011-01-15 MED ORDER — DOCUSATE SODIUM 100 MG PO CAPS: 100.0000 mg | ORAL_CAPSULE | Freq: Two times a day (BID) | ORAL | Status: AC | PRN

## 2011-01-15 MED ORDER — IBUPROFEN 600 MG PO TABS: 600.0000 mg | ORAL_TABLET | Freq: Four times a day (QID) | ORAL | Status: AC | PRN

## 2011-01-15 MED ORDER — LORAZEPAM 1 MG PO TABS: 1.0000 mg | ORAL_TABLET | Freq: Four times a day (QID) | ORAL | Status: AC | PRN

## 2011-01-15 MED ORDER — CIPROFLOXACIN HCL 500 MG PO TABS: 500.0000 mg | ORAL_TABLET | ORAL | Status: AC

## 2011-01-15 NOTE — Consult Note (Signed)
NAMESHERLIE, BOYUM NO.:  [id number]  MEDICAL RECORD NO.:  [id number]  LOCATION:  9304                          FACILITY:  [facility]  PHYSICIAN:  Jerilee Field, MD   DATE OF BIRTH:  April 25, 1976  DATE OF CONSULTATION: DATE OF DISCHARGE:                                CONSULTATION   Consult requested by Dr. Shawnie Pons for bladder injury/cystotomy during a left salpingo-oophorectomy for pain.  HISTORY OF PRESENT ILLNESS:  Ms. Sowder is a 35 year old female.  She has a long history of pelvic pain and endometriosis.  She had a previous hysterectomy and right salpingo-oophorectomy.  She was taken to the operating room due to recurrent pain and left salpingo-oophorectomy. During the procedure, the left ovary was scarred to the left dome of the bladder and a cystotomy was made while removing the left ovary. Therefore, Urology was consulted for intraoperative consult to assist in repair of the cystotomy.  PAST MEDICAL HISTORY:  The patient is a G2, P2.  Gastroesophageal reflux disease.  PAST SURGICAL HISTORY: 1. Total abdominal hysterectomy with right salpingo-oophorectomy. 2. Tonsillectomy and adenoidectomy. 3. Ankle surgery. 4. C-section x2.  FAMILY HISTORY:  Positive for diabetes and hypertension.  SOCIAL HISTORY:  The patient denies smoking.  She denies alcohol and drug use.  ALLERGIES:  No known drug allergies.  MEDICATIONS:  Nexium, Vicodin, Motrin, MiraLax, Ambien.  REVIEW OF SYSTEMS:  Ten-system review of systems was obtained which is as per HPI and was positive for abdominal pain, myalgia, and back pain. All other review of systems negative.  PHYSICAL EXAMINATION:  The patient is in the operating room, under anesthesia, intubated, in deep Trendelenburg position.  She is stable from a cardiovascular point of view.  The abdomen is prepped and draped. There is a midline port near the umbilicus and the right lower quadrant and left lower quadrant ports.  On  laparoscopic exam, there is a cystotomy that is several centimeters long situated about the left dome of the bladder.  Direct cystoscopy reveals the trigone to be intact and good efflux from the left and the right ureteral orifice.  There is also a 14-French Foley visible sitting at the bladder neck with the balloon inflated.  OBJECTIVE DATA:  Chest x-ray, September 2012, showed no acute cardiopulmonary process.  ASSESSMENT: 1. Bladder injury/cystotomy during left laparoscopic salpingo-     oophorectomy. 2. Chronic pelvic pain. 3. Endometriosis. 4. Multiple prior surgeries.  PLAN: 1. This cystotomy is in a favorable location to close laparoscopically     and this was successfully undertaken and dictated in a separate     operative note. 2. Continue 18-French Foley catheter and JP drainage.  Check JP for     creatinine in a.m. and if JP creatinine is equal to serum     creatinine, JP can be removed, and the patient discharged to home     with Foley catheter to gravity drainage. 3. Antibiotics such as Cipro or Bactrim twice a day for 3 days     following by one p.o. daily for the next 21 days.  Antibiotics will     be stopped couple days after the Foley is removed. 4. The patient  can follow up with me at [facility] [phone] in 7     to 10 days for cystogram, possible voiding trial.          ______________________________ Jerilee Field, MD     ME/MEDQ  D:  01/14/2011  T:  01/14/2011  Job:  [id number]

## 2011-01-15 NOTE — Discharge Summary (Signed)
[name], [name], Sheila left ovary after hysterectomy and right salpingoophorectomy  Discharge Diagnoses:  Stage IV [name], surgical menopause, inadvertent cystotomy during surgery that was repaired  Procedures:  LAPAROSCOPY OPERATIVE  SALPINGO OOPHERECTOMY (Left) BLADDER CYSTOTOMY REPAIR  Treatments: Antibiotics: Ciprofloxacin and procedures as above  Significant Diagnostic Studies: Preoperative hemoglobin 10; postoperative hemoglobin 9.3.   Drain: Indwelling bladder foley catheter; will remain in place for two weeks.  Consults: Intraoperative Urology Consult - Dr. Amedeo Kinsman Course:   [name] is a 35 y.o. [id number]  admitted for scheduled surgery.  She underwent the procedures as mentioned above, her operation was complicated by an inadvertent cystotomy that was repaired with the help of Urology consultation. For further details about surgery, please refer to the operative reports by Dr. Tinnie Gens and Dr. Jerilee Field.  An intraperitoneal JP drain was placed as per Urology recommendations, and the output was 80 ml on the day of surgery.  JP output steadily declined, and the drain was removed by Dr. Mena Goes on postoperative day #1.  Her bladder foley catheter was kept in place and will remain in place for two weeks.  Patient was started on Ciprofloxacin for UTI prophylaxis as per Dr. Mena Goes. Given her complication, patient was observed overnight.  Patient had an uncomplicated postoperative course. By time of discharge, her [name] was controlled on oral [name] medications; she was ambulating, voiding without difficulty, tolerating regular diet and passing flatus. She was deemed stable for discharge to home, with home health help given her indwelling drain.    Discharge Exam: Blood pressure 119/78, pulse 77, temperature 98.8 F (37.1 C), temperature source Oral, resp. rate 16, height 5\' 6"  (1.676 m), weight 93.441 kg (206 lb), last menstrual period 12/27/2003, SpO2 100.00%. General appearance: alert and no distress Resp: clear to auscultation bilaterally Cardio: regular rate and rhythm GI: soft, mild tenderness to palpation, positive bowel sounds, laparoscopy incisions are clean/dry/intact, no erythema Pelvic: foley catheter in place Extremities: extremities normal, atraumatic, no cyanosis or edema and Homans sign is negative, no sign of DVT  Discharged Condition: Stable  Disposition: Home or Self Care with Home Health Nursing.  Discharge Orders    Future Appointments: Provider: Department: Dept Phone: Center:   02/07/2011 3:30 PM Catalina Antigua, MD [facility] [phone] [facility]    Follow up with with Dr. Jerilee Field at [facility] [phone] in 7 to 10 days for cystogram, possible voiding trial.   Current Discharge Medication List    START taking these medications   Details  ciprofloxacin (CIPRO) 500 MG tablet Take 1 tablet (500 mg total) by mouth as directed. Twice daily for 3 days; then once daily until foley is removed Qty: 20 tablet, Refills: 0    docusate sodium (COLACE) 100 MG capsule Take 1 capsule (100 mg total) by mouth 2 (two) times daily as needed for constipation. Qty: 30 capsule, Refills: 2    Ibuprofen (ADVIL,MOTRIN) 600 MG tablet Take 1 tablet (600 mg total) by mouth every 6 (six) hours as needed for [name]. Qty: 60 tablet, Refills: 2    LORazepam (ATIVAN) 1 MG tablet Take 1 tablet (1 mg total) by mouth every 6 (six) hours as needed for anxiety. Qty: 30 tablet, Refills: 1    oxyCODONE-acetaminophen (PERCOCET) 5-325 MG per tablet Take 1-2 tablets by mouth every 6 (six)  hours as needed for [name] (moderate to severe [name] (when tolerating fluids)). Qty: 60 tablet, Refills: 0        CONTINUE these  medications which have NOT CHANGED   Details  esomeprazole (NEXIUM) 40 MG capsule Take 40 mg by mouth daily.      HYDROcodone-acetaminophen (VICODIN) 5-500 MG per tablet Take 1 tablet by mouth every 6 (six) hours as needed for [name]. Qty: 48 tablet, Refills: 1   Associated Diagnoses: Pelvic [name] in female    Ibuprofen (ADVIL,MOTRIN) 200 MG tablet Take 800 mg by mouth every 6 (six) hours as needed. Prn [name]    polyethylene glycol (MIRALAX / GLYCOLAX) packet Take 17 g by mouth daily.      zolpidem (AMBIEN CR) 12.5 MG CR tablet Take 12.5 mg by mouth at bedtime as needed.           SignedJaynie Collins A 01/15/2011, 6:37 AM

## 2011-01-15 NOTE — Progress Notes (Signed)
1 Day Post-Op Procedure(s): LAPAROSCOPY OPERATIVE SALPINGO OOPHERECTOMY BLADDER CYSTOTOMY REPAIR  Subjective: Patient reports incisional pain and tolerating PO diet.  Reports abdominal pain.     Objective: I have reviewed patient's vital signs, intake and output, medications and labs. JP output was 91 ml since surgery.  General: alert and no distress Resp: clear to auscultation bilaterally Cardio: regular rate and rhythm GI: normal findings: bowel sounds normal and mildy tender to palpation and incision: clean, dry, intact and JP drain present Extremities: extremities normal, atraumatic, no cyanosis or edema and Homans sign is negative, no sign of DVT  Lab Results  Component Value Date   WBC 13.3* 01/15/2011   HGB 9.3* 01/15/2011   HCT 28.9* 01/15/2011   MCV 78.3 01/15/2011   PLT 268 01/15/2011   JP drain fluid creatinine analysis pending.  Assessment: s/p Procedure(s): LAPAROSCOPY OPERATIVE SALPINGO OOPHERECTOMY BLADDER CYSTOTOMY REPAIR: stable, progressing well, tolerating diet and JP & foley drains in place.  Plan: Encourage ambulation Advance to PO medication Discharge home Home [facility] for foley and JP care.  Follow up in clinic as scheduled.  LOS: 1 day    ANYANWU,UGONNA A 01/15/2011, 6:53 AM

## 2011-02-06 ENCOUNTER — Encounter (HOSPITAL_COMMUNITY): Payer: Self-pay | Admitting: Family Medicine

## 2011-02-07 ENCOUNTER — Ambulatory Visit (INDEPENDENT_AMBULATORY_CARE_PROVIDER_SITE_OTHER): Payer: BC Managed Care – PPO | Admitting: Obstetrics and Gynecology

## 2011-02-07 ENCOUNTER — Encounter: Payer: Self-pay | Admitting: Obstetrics and Gynecology

## 2011-02-07 VITALS — BP 116/82 | HR 69 | Temp 98.0°F | Ht 64.0 in | Wt 199.4 lb

## 2011-02-07 DIAGNOSIS — Z09 Encounter for follow-up examination after completed treatment for conditions other than malignant neoplasm: Secondary | ICD-10-CM

## 2011-02-07 LAB — POCT URINALYSIS DIP (DEVICE)
Bilirubin Urine: NEGATIVE
Glucose, UA: NEGATIVE mg/dL
Hgb urine dipstick: NEGATIVE
Ketones, ur: NEGATIVE mg/dL
Nitrite: NEGATIVE
Protein, ur: NEGATIVE mg/dL
Specific Gravity, Urine: <=1.005 (ref 1.005–1.030)
Urobilinogen, UA: 0.2 mg/dL (ref 0.0–1.0)
pH: 6 (ref 5.0–8.0)

## 2011-02-07 NOTE — Progress Notes (Signed)
  Subjective:    Patient ID: [name], female    DOB: [birth year], 35 y.o.   MRN: [id number]  HPI  35 yo s/p LSC LSO for management of chronic pelvic pain due to endometriosis with intraop complication of cystotomy presenting today for postoperative check. Patient is doing well and without any complaints. Patient was seen by urology a few weeks ago for the removal of her leg bag. Patient reports experiencing hot flashes and night sweats as she did preoperatively and does not see the need for hormone replacement therapy at that time.  Review of Systems  All other systems reviewed and are negative.      Objective:   Physical Exam  GENERAL: Well-developed, well-nourished female in no acute distress.  LUNGS: Clear to auscultation bilaterally.  HEART: Regular rate and rhythm. ABDOMEN: Soft, nontender, nondistended. No organomegaly. Incision healing well, No erythema, induration or drainage EXTREMITIES: No cyanosis, clubbing, or edema, 2+ distal pulses.     Assessment & Plan:  35 yo her for post op check s/p lsc LSO with intraop cystotomy on 01/13/2011 for treatment of endometriosis - patient medically cleared to resume all activities of daily living - Patient to follow-up with urology as scheduled in 4-6 weeks - patient due to return for annual pap smears in early 2013 or prn

## 2011-02-22 ENCOUNTER — Inpatient Hospital Stay (HOSPITAL_COMMUNITY)
Admission: AD | Admit: 2011-02-22 | Discharge: 2011-02-22 | Disposition: A | Discharge status: Home / Self Care | Payer: BC Managed Care – PPO | Source: Ambulatory Visit | Attending: Obstetrics & Gynecology | Admitting: Obstetrics & Gynecology

## 2011-02-22 ENCOUNTER — Inpatient Hospital Stay (HOSPITAL_COMMUNITY): Payer: BC Managed Care – PPO

## 2011-02-22 ENCOUNTER — Encounter (HOSPITAL_COMMUNITY): Payer: Self-pay | Admitting: Obstetrics and Gynecology

## 2011-02-22 DIAGNOSIS — R1032 Left lower quadrant pain: Secondary | ICD-10-CM

## 2011-02-22 DIAGNOSIS — N949 Unspecified condition associated with female genital organs and menstrual cycle: Secondary | ICD-10-CM | POA: Insufficient documentation

## 2011-02-22 LAB — URINALYSIS, ROUTINE W REFLEX MICROSCOPIC
Bilirubin Urine: NEGATIVE
Glucose, UA: NEGATIVE mg/dL
Ketones, ur: NEGATIVE mg/dL
Leukocytes, UA: NEGATIVE
Nitrite: NEGATIVE
Protein, ur: NEGATIVE mg/dL
Specific Gravity, Urine: <1.005 — ABNORMAL LOW (ref 1.005–1.030)
Urobilinogen, UA: 0.2 mg/dL (ref 0.0–1.0)
pH: 6 (ref 5.0–8.0)

## 2011-02-22 LAB — CBC
HCT: 29.6 % — ABNORMAL LOW (ref 36.0–46.0)
Hemoglobin: 9.3 g/dL — ABNORMAL LOW (ref 12.0–15.0)
MCH: 24.4 pg — ABNORMAL LOW (ref 26.0–34.0)
MCHC: 31.4 g/dL (ref 30.0–36.0)
MCV: 77.7 fL — ABNORMAL LOW (ref 78.0–100.0)
Platelets: 236 10*3/uL (ref 150–400)
RBC: 3.81 MIL/uL — ABNORMAL LOW (ref 3.87–5.11)
RDW: 13.2 % (ref 11.5–15.5)
WBC: 5.4 10*3/uL (ref 4.0–10.5)

## 2011-02-22 LAB — URINE MICROSCOPIC-ADD ON

## 2011-02-22 MED ORDER — ONDANSETRON HCL 4 MG PO TABS
8.0000 mg | ORAL_TABLET | Freq: Once | ORAL | Status: AC
  Administered 2011-02-22: 8 mg via ORAL
  Filled 2011-02-22: qty 2

## 2011-02-22 MED ORDER — HYDROMORPHONE HCL 2 MG PO TABS: 2.0000 mg | ORAL_TABLET | ORAL | Status: DC | PRN

## 2011-02-22 MED ORDER — HYDROMORPHONE HCL 2 MG PO TABS
2.0000 mg | ORAL_TABLET | Freq: Once | ORAL | Status: AC
  Administered 2011-02-22: 2 mg via ORAL
  Filled 2011-02-22: qty 1

## 2011-02-22 MED ORDER — KETOROLAC TROMETHAMINE 60 MG/2ML IM SOLN
60.0000 mg | Freq: Once | INTRAMUSCULAR | Status: AC
  Administered 2011-02-22: 60 mg via INTRAMUSCULAR
  Filled 2011-02-22: qty 2

## 2011-02-22 MED ORDER — ONDANSETRON HCL 8 MG PO TABS: 8.0000 mg | ORAL_TABLET | Freq: Three times a day (TID) | ORAL | Status: DC | PRN

## 2011-02-22 NOTE — Progress Notes (Signed)
"  I had my LT ovary taken out on 01/14/11 d/t the LT ovary being stuck to my bladder.  I was having pain after surgery.  I had several f/u visits and nothing was found out.  I am fine when I get off work, settle down, lay down and go to sleep.  Once I wake up and start moving around, the pain and pressure comes right back.  It is right over my where my LT ovary was removed."

## 2011-02-22 NOTE — ED Provider Notes (Signed)
History   The patient is a 35 y.o. year old G4P2002 female 6 weeks S/P LSO and repair of incidental cyctotomy who presents to [facility] reporting new onset of LLQ pain and fullness x 1 week and nausea x one day. She also reports pink drainage from her umbilicus once in the past week.     CSN: [id number] Arrival date & time: 02/22/2011  7:40 PM   None     Chief Complaint  Patient presents with  . Abdominal Pain    (Consider location/radiation/quality/duration/timing/severity/associated sxs/prior treatment) HPI  Past Medical History  Diagnosis Date  . GERD (gastroesophageal reflux disease)   . Endometriosis     Past Surgical History  Procedure Date  . Abdominal hysterectomy   . Tonsillectomy and adenoidectomy     childhood  . Ankle surgery 2009  . Salpingectomy 2005    right  . Tubal ligation   . Right oophorectomy 2010  . Cesarean section     c-section x2  . Laparoscopy 01/14/2011    Procedure: LAPAROSCOPY OPERATIVE;  Surgeon: Reva Bores, MD;  Location: [facility];  Service: Gynecology;  Laterality: N/A;  . Salpingoophorectomy 01/14/2011    Procedure: SALPINGO OOPHERECTOMY;  Surgeon: Reva Bores, MD;  Location: [facility];  Service: Gynecology;  Laterality: Left;  . Bladder neck reconstruction 01/14/2011    Procedure: BLADDER NECK REPAIR;  Surgeon: Reva Bores, MD;  Location: [facility];  Service: Gynecology;  Laterality: N/A;  Laparoscopic Repair of Incidental Cystotomy  . Fracture surgery     Family History  Problem Relation Age of Onset  . Diabetes Mother   . Hypertension Mother   . Thyroid disease Sister     History  Substance Use Topics  . Smoking status: Never Smoker   . Smokeless tobacco: Never Used  . Alcohol Use: No    OB History    Grav Para Term Preterm Abortions TAB SAB Ect Mult Living   2 2 2       2       Review of Systems  Constitutional: Positive for diaphoresis. Negative for fever, chills, activity change and unexpected weight change.    Gastrointestinal: Positive for nausea and abdominal pain (LLQ/groin). Negative for vomiting, diarrhea, constipation and abdominal distention.  Genitourinary: Positive for dysuria (mild at end of urination--since surgery). Negative for urgency, frequency, hematuria, flank pain, vaginal bleeding, difficulty urinating and pelvic pain.    Allergies  Review of patient's allergies indicates no known allergies.  Home Medications  No current outpatient prescriptions on file.  BP 125/85  Pulse 64  Temp(Src) 99.1 F (37.3 C) (Oral)  Ht 5\' 4"  (1.626 m)  Wt 91.853 kg (202 lb 8 oz)  BMI 34.76 kg/m2  LMP 12/27/2003  Physical Exam  Constitutional: She is oriented to person, place, and time. She appears well-developed. She appears distressed.  Cardiovascular: Normal rate.   Pulmonary/Chest: Effort normal.  Abdominal: Soft. Bowel sounds are normal. She exhibits no distension. There is tenderness in the left lower quadrant. There is no guarding and no CVA tenderness.         Three well-healed laparoscopy incisions   Neurological: She is alert and oriented to person, place, and time.  Skin: Skin is warm and dry. She is not diaphoretic.  Psychiatric: She has a normal mood and affect.    ED Course  Procedures (including critical care time)  Results for orders placed during the hospital encounter of 02/22/11 (from the past 24 hour(s))  URINALYSIS, ROUTINE W REFLEX  MICROSCOPIC     Status: Abnormal   Collection Time   02/22/11  8:10 PM      Component Value Range   Color, Urine YELLOW  YELLOW    Appearance CLEAR  CLEAR    Specific Gravity, Urine <1.005 (*) 1.005 - 1.030    pH 6.0  5.0 - 8.0    Glucose, UA NEGATIVE  NEGATIVE (mg/dL)   Hgb urine dipstick TRACE (*) NEGATIVE    Bilirubin Urine NEGATIVE  NEGATIVE    Ketones, ur NEGATIVE  NEGATIVE (mg/dL)   Protein, ur NEGATIVE  NEGATIVE (mg/dL)   Urobilinogen, UA 0.2  0.0 - 1.0 (mg/dL)   Nitrite NEGATIVE  NEGATIVE    Leukocytes, UA NEGATIVE   NEGATIVE   URINE MICROSCOPIC-ADD ON     Status: Normal   Collection Time   02/22/11  8:10 PM      Component Value Range   Squamous Epithelial / LPF RARE  RARE    Bacteria, UA RARE  RARE   CBC     Status: Abnormal   Collection Time   02/22/11  8:49 PM      Component Value Range   WBC 5.4  4.0 - 10.5 (K/uL)   RBC 3.81 (*) 3.87 - 5.11 (MIL/uL)   Hemoglobin 9.3 (*) 12.0 - 15.0 (g/dL)   HCT 16.1 (*) 09.6 - 46.0 (%)   MCV 77.7 (*) 78.0 - 100.0 (fL)   MCH 24.4 (*) 26.0 - 34.0 (pg)   MCHC 31.4  30.0 - 36.0 (g/dL)   RDW 04.5  40.9 - 81.1 (%)   Platelets 236  150 - 400 (K/uL)   US Transvaginal Non-ob  02/22/2011  *RADIOLOGY REPORT*  Clinical Data: Left lower quadrant abdominal pain.  Endometriosis. Prior hysterectomy and salpingo-oophorectomy.  TRANSVAGINAL ULTRASOUND OF PELVIS  Technique:  Transvaginal ultrasound examination of the pelvis was performed including evaluation of the uterus, ovaries, adnexal regions, and pelvic cul-de-sac.  Comparison:  None.  Findings:  Uterus:  Absent.  Endometrium: Absent  Right ovary: Absent  Left ovary: Absent  Other Findings:  No adnexal or vaginal cuff mass is identified. Peristalsing bowel is noted.  The urinary bladder is nondistended and thus thick-walled.  No observed free pelvic fluid.  IMPRESSION: No residual adnexal mass or free pelvic fluid is observed.  The uterus and ovaries are surgically absent.  Urinary bladder is nondistended.  Original Report Authenticated By: Dellia Cloud, M.D.     MDM  Assessment: 1. 6+ weeks S/P LSO 2. LLQ pain w/ no evidence of surgical infection, fluid collection or UTI 3. Chronic pelvic pain  Plan: 1. Per consult w/ Dr. Penne Lash, D/C home 2. Rx Dilaudid 2 mg PO Q4-6 PRN, #30 and Zofran 3. F/U in ED if pain continues after course of meds.  [name] 02/23/2011 12:50 AM

## 2011-02-22 NOTE — Progress Notes (Signed)
Pt states, " I had my left ovary removed Sept 10th. For one week I've pressure and pain in my left lower abdomen. I felt like this even before my surgery. It hurts a little when I pee and it is getting ready to end."

## 2011-02-23 MED ORDER — ONDANSETRON HCL 8 MG PO TABS: 8.0000 mg | ORAL_TABLET | Freq: Three times a day (TID) | ORAL | Status: AC | PRN

## 2011-02-23 MED ORDER — HYDROMORPHONE HCL 2 MG PO TABS: 2.0000 mg | ORAL_TABLET | ORAL | Status: AC | PRN

## 2011-02-23 NOTE — ED Provider Notes (Signed)
Agree with above note.  [name] H. 02/23/2011 5:05 AM 

## 2011-05-03 ENCOUNTER — Emergency Department (HOSPITAL_COMMUNITY)
Admission: EM | Admit: 2011-05-03 | Discharge: 2011-05-04 | Discharge status: Against Medical Advice | Payer: BC Managed Care – PPO | Attending: Emergency Medicine | Admitting: Emergency Medicine

## 2011-05-03 DIAGNOSIS — R0602 Shortness of breath: Secondary | ICD-10-CM | POA: Insufficient documentation

## 2011-05-04 ENCOUNTER — Encounter (HOSPITAL_COMMUNITY): Payer: Self-pay | Admitting: Emergency Medicine

## 2011-05-04 NOTE — ED Notes (Signed)
PT. REPORTS HEADACHE WITH SOB AND NAUSEA FOR 2 DAYS , DENIES VOMITTING OR DIARRHEA , NO FEVER OD CHILLS , SLIGHT BODY ACHES .

## 2011-05-07 HISTORY — PX: ADENOIDECTOMY: SUR15

## 2011-07-05 DIAGNOSIS — N62 Hypertrophy of breast: Secondary | ICD-10-CM

## 2011-07-05 HISTORY — DX: Hypertrophy of breast: N62

## 2011-07-09 ENCOUNTER — Ambulatory Visit: Payer: Self-pay | Admitting: Obstetrics and Gynecology

## 2011-07-18 ENCOUNTER — Ambulatory Visit (INDEPENDENT_AMBULATORY_CARE_PROVIDER_SITE_OTHER): Payer: BC Managed Care – PPO | Admitting: Obstetrics and Gynecology

## 2011-07-18 DIAGNOSIS — Z01419 Encounter for gynecological examination (general) (routine) without abnormal findings: Secondary | ICD-10-CM

## 2011-07-18 DIAGNOSIS — Z202 Contact with and (suspected) exposure to infections with a predominantly sexual mode of transmission: Secondary | ICD-10-CM

## 2011-07-29 ENCOUNTER — Encounter (HOSPITAL_BASED_OUTPATIENT_CLINIC_OR_DEPARTMENT_OTHER): Payer: Self-pay | Admitting: *Deleted

## 2011-08-05 ENCOUNTER — Encounter (HOSPITAL_BASED_OUTPATIENT_CLINIC_OR_DEPARTMENT_OTHER)
Admission: RE | Disposition: A | Discharge status: Home / Self Care | Payer: Self-pay | Source: Ambulatory Visit | Attending: Specialist

## 2011-08-05 ENCOUNTER — Encounter (HOSPITAL_BASED_OUTPATIENT_CLINIC_OR_DEPARTMENT_OTHER): Payer: Self-pay

## 2011-08-05 ENCOUNTER — Encounter (HOSPITAL_BASED_OUTPATIENT_CLINIC_OR_DEPARTMENT_OTHER): Payer: Self-pay | Admitting: Anesthesiology

## 2011-08-05 ENCOUNTER — Ambulatory Visit (HOSPITAL_BASED_OUTPATIENT_CLINIC_OR_DEPARTMENT_OTHER)
Admission: RE | Admit: 2011-08-05 | Discharge: 2011-08-06 | Disposition: A | Discharge status: Home / Self Care | Payer: BC Managed Care – PPO | Source: Ambulatory Visit | Attending: Specialist | Admitting: Specialist

## 2011-08-05 ENCOUNTER — Ambulatory Visit (HOSPITAL_BASED_OUTPATIENT_CLINIC_OR_DEPARTMENT_OTHER): Payer: BC Managed Care – PPO | Admitting: Anesthesiology

## 2011-08-05 DIAGNOSIS — M25519 Pain in unspecified shoulder: Secondary | ICD-10-CM | POA: Insufficient documentation

## 2011-08-05 DIAGNOSIS — M549 Dorsalgia, unspecified: Secondary | ICD-10-CM | POA: Insufficient documentation

## 2011-08-05 DIAGNOSIS — Z9079 Acquired absence of other genital organ(s): Secondary | ICD-10-CM | POA: Insufficient documentation

## 2011-08-05 DIAGNOSIS — Z79899 Other long term (current) drug therapy: Secondary | ICD-10-CM | POA: Insufficient documentation

## 2011-08-05 DIAGNOSIS — D649 Anemia, unspecified: Secondary | ICD-10-CM | POA: Insufficient documentation

## 2011-08-05 DIAGNOSIS — Z90710 Acquired absence of both cervix and uterus: Secondary | ICD-10-CM | POA: Insufficient documentation

## 2011-08-05 DIAGNOSIS — N62 Hypertrophy of breast: Secondary | ICD-10-CM | POA: Insufficient documentation

## 2011-08-05 DIAGNOSIS — K219 Gastro-esophageal reflux disease without esophagitis: Secondary | ICD-10-CM | POA: Insufficient documentation

## 2011-08-05 HISTORY — DX: Anemia, unspecified: D64.9

## 2011-08-05 HISTORY — PX: BREAST REDUCTION SURGERY: SHX8

## 2011-08-05 HISTORY — DX: Personal history of other diseases of the female genital tract: Z87.42

## 2011-08-05 HISTORY — DX: Hypertrophy of breast: N62

## 2011-08-05 SURGERY — MAMMOPLASTY, REDUCTION
Anesthesia: General | Site: Breast | Laterality: Bilateral | Wound class: Clean

## 2011-08-05 MED ORDER — HYDROMORPHONE HCL PF 1 MG/ML IJ SOLN
0.2500 mg | INTRAMUSCULAR | Status: DC | PRN
  Administered 2011-08-05 (×4): 0.5 mg via INTRAVENOUS

## 2011-08-05 MED ORDER — LIDOCAINE HCL (CARDIAC) 20 MG/ML IV SOLN
INTRAVENOUS | Status: DC | PRN
  Administered 2011-08-05: 50 mg via INTRAVENOUS

## 2011-08-05 MED ORDER — CEFAZOLIN SODIUM 1-5 GM-% IV SOLN
1.0000 g | Freq: Three times a day (TID) | INTRAVENOUS | Status: DC
  Administered 2011-08-05 – 2011-08-06 (×3): 1 g via INTRAVENOUS

## 2011-08-05 MED ORDER — PANTOPRAZOLE SODIUM 40 MG PO TBEC: 40.0000 mg | DELAYED_RELEASE_TABLET | Freq: Every day | ORAL | Status: DC

## 2011-08-05 MED ORDER — ONDANSETRON HCL 4 MG/2ML IJ SOLN
INTRAMUSCULAR | Status: DC | PRN
  Administered 2011-08-05: 4 mg via INTRAVENOUS

## 2011-08-05 MED ORDER — SUCCINYLCHOLINE CHLORIDE 20 MG/ML IJ SOLN
INTRAMUSCULAR | Status: DC | PRN
  Administered 2011-08-05: 100 mg via INTRAVENOUS

## 2011-08-05 MED ORDER — LIDOCAINE-EPINEPHRINE 0.5-1:200000 % IJ SOLN
INTRAMUSCULAR | Status: DC | PRN
  Administered 2011-08-05: 50 mL

## 2011-08-05 MED ORDER — PROPOFOL 10 MG/ML IV EMUL
INTRAVENOUS | Status: DC | PRN
  Administered 2011-08-05: 200 mg via INTRAVENOUS

## 2011-08-05 MED ORDER — DROPERIDOL 2.5 MG/ML IJ SOLN
INTRAMUSCULAR | Status: DC | PRN
  Administered 2011-08-05: 0.625 mg via INTRAVENOUS

## 2011-08-05 MED ORDER — PROMETHAZINE HCL 25 MG/ML IJ SOLN: 12.5000 mg | Freq: Four times a day (QID) | INTRAMUSCULAR | Status: DC | PRN

## 2011-08-05 MED ORDER — METOCLOPRAMIDE HCL 5 MG/ML IJ SOLN: 10.0000 mg | Freq: Once | INTRAMUSCULAR | Status: AC | PRN

## 2011-08-05 MED ORDER — LACTATED RINGERS IV SOLN
INTRAVENOUS | Status: DC
  Administered 2011-08-05 (×2): via INTRAVENOUS

## 2011-08-05 MED ORDER — OXYCODONE HCL 5 MG PO TABS
5.0000 mg | ORAL_TABLET | Freq: Once | ORAL | Status: AC
  Administered 2011-08-05: 5 mg via ORAL

## 2011-08-05 MED ORDER — ACETAMINOPHEN 10 MG/ML IV SOLN
1000.0000 mg | Freq: Once | INTRAVENOUS | Status: AC
  Administered 2011-08-05: 1000 mg via INTRAVENOUS

## 2011-08-05 MED ORDER — FENTANYL CITRATE 0.05 MG/ML IJ SOLN
INTRAMUSCULAR | Status: DC | PRN
  Administered 2011-08-05 (×2): 50 ug via INTRAVENOUS
  Administered 2011-08-05: 100 ug via INTRAVENOUS

## 2011-08-05 MED ORDER — CEFAZOLIN SODIUM 1-5 GM-% IV SOLN
1.0000 g | INTRAVENOUS | Status: AC
  Administered 2011-08-05: 2 g via INTRAVENOUS

## 2011-08-05 MED ORDER — MIDAZOLAM HCL 5 MG/5ML IJ SOLN
INTRAMUSCULAR | Status: DC | PRN
  Administered 2011-08-05: 2 mg via INTRAVENOUS

## 2011-08-05 MED ORDER — DIPHENHYDRAMINE HCL 50 MG/ML IJ SOLN
25.0000 mg | Freq: Four times a day (QID) | INTRAMUSCULAR | Status: DC | PRN
  Administered 2011-08-05 – 2011-08-06 (×2): 25 mg via INTRAVENOUS

## 2011-08-05 MED ORDER — HYDROCODONE-ACETAMINOPHEN 5-500 MG PO CAPS: 1.0000 | ORAL_CAPSULE | Freq: Four times a day (QID) | ORAL | Status: AC | PRN

## 2011-08-05 MED ORDER — OXYCODONE-ACETAMINOPHEN 5-325 MG PO TABS: 1.0000 | ORAL_TABLET | ORAL | Status: DC | PRN

## 2011-08-05 MED ORDER — OXYCODONE-ACETAMINOPHEN 5-325 MG PO TABS
1.0000 | ORAL_TABLET | ORAL | Status: DC | PRN
  Administered 2011-08-05 – 2011-08-06 (×4): 2 via ORAL

## 2011-08-05 MED ORDER — DEXTROSE IN LACTATED RINGERS 5 % IV SOLN
INTRAVENOUS | Status: DC
  Administered 2011-08-05 (×2): via INTRAVENOUS

## 2011-08-05 MED ORDER — DEXAMETHASONE SODIUM PHOSPHATE 4 MG/ML IJ SOLN
INTRAMUSCULAR | Status: DC | PRN
  Administered 2011-08-05: 10 mg via INTRAVENOUS

## 2011-08-05 MED ORDER — SODIUM CHLORIDE 0.9 % IV SOLN: 25.0000 mg | Freq: Once | INTRAVENOUS | Status: DC

## 2011-08-05 MED ORDER — KEFLEX 500 MG PO CAPS: 500.0000 mg | ORAL_CAPSULE | Freq: Four times a day (QID) | ORAL | Status: AC

## 2011-08-05 MED ORDER — MIDAZOLAM HCL 2 MG/2ML IJ SOLN: 0.5000 mg | INTRAMUSCULAR | Status: DC | PRN

## 2011-08-05 SURGICAL SUPPLY — 57 items
APL SKNCLS STERI-STRIP NONHPOA (GAUZE/BANDAGES/DRESSINGS) ×2
BAG DECANTER FOR FLEXI CONT (MISCELLANEOUS) ×2 IMPLANT
BENZOIN TINCTURE PRP APPL 2/3 (GAUZE/BANDAGES/DRESSINGS) ×4 IMPLANT
BLADE KNIFE  20 PERSONNA (BLADE) ×2
BLADE KNIFE 20 PERSONNA (BLADE) ×2 IMPLANT
BLADE KNIFE PERSONA 15 (BLADE) ×2 IMPLANT
CANISTER SUCTION 1200CC (MISCELLANEOUS) ×2 IMPLANT
CLOTH BEACON ORANGE TIMEOUT ST (SAFETY) ×2 IMPLANT
COVER MAYO STAND STRL (DRAPES) ×2 IMPLANT
COVER TABLE BACK 60X90 (DRAPES) ×2 IMPLANT
DECANTER SPIKE VIAL GLASS SM (MISCELLANEOUS) ×4 IMPLANT
DRAIN CHANNEL 10F 3/8 F FF (DRAIN) ×4 IMPLANT
DRAPE LAPAROSCOPIC ABDOMINAL (DRAPES) ×2 IMPLANT
DRAPE UTILITY XL STRL (DRAPES) ×1 IMPLANT
DRSG PAD ABDOMINAL 8X10 ST (GAUZE/BANDAGES/DRESSINGS) ×8 IMPLANT
ELECT REM PT RETURN 9FT ADLT (ELECTROSURGICAL) ×2
ELECTRODE REM PT RTRN 9FT ADLT (ELECTROSURGICAL) ×1 IMPLANT
EVACUATOR SILICONE 100CC (DRAIN) ×4 IMPLANT
FILTER 7/8 IN (FILTER) IMPLANT
GAUZE XEROFORM 5X9 LF (GAUZE/BANDAGES/DRESSINGS) ×4 IMPLANT
GLOVE BIO SURGEON STRL SZ 6.5 (GLOVE) ×2 IMPLANT
GLOVE BIOGEL M STRL SZ7.5 (GLOVE) ×2 IMPLANT
GLOVE BIOGEL PI IND STRL 8 (GLOVE) ×1 IMPLANT
GLOVE BIOGEL PI INDICATOR 8 (GLOVE) ×1
GLOVE ECLIPSE 7.0 STRL STRAW (GLOVE) ×2 IMPLANT
GOWN PREVENTION PLUS XXLARGE (GOWN DISPOSABLE) ×4 IMPLANT
IV NS 500ML (IV SOLUTION) ×2
IV NS 500ML BAXH (IV SOLUTION) ×1 IMPLANT
NDL SPNL 18GX3.5 QUINCKE PK (NEEDLE) ×1 IMPLANT
NEEDLE SPNL 18GX3.5 QUINCKE PK (NEEDLE) ×2 IMPLANT
NS IRRIG 1000ML POUR BTL (IV SOLUTION) IMPLANT
PACK BASIN DAY SURGERY FS (CUSTOM PROCEDURE TRAY) ×2 IMPLANT
PEN SKIN MARKING BROAD TIP (MISCELLANEOUS) ×2 IMPLANT
PILLOW FOAM RUBBER ADULT (PILLOWS) ×2 IMPLANT
PIN SAFETY STERILE (MISCELLANEOUS) ×2 IMPLANT
SHEETING SILICONE GEL EPI DERM (MISCELLANEOUS) IMPLANT
SPECIMEN JAR MEDIUM (MISCELLANEOUS) IMPLANT
SPECIMEN JAR X LARGE (MISCELLANEOUS) ×4 IMPLANT
SPONGE GAUZE 4X4 12PLY (GAUZE/BANDAGES/DRESSINGS) ×4 IMPLANT
SPONGE LAP 18X18 X RAY DECT (DISPOSABLE) ×8 IMPLANT
STRIP SUTURE WOUND CLOSURE 1/2 (SUTURE) ×10 IMPLANT
SUT MNCRL AB 3-0 PS2 18 (SUTURE) ×12 IMPLANT
SUT MON AB 2-0 CT1 36 (SUTURE) IMPLANT
SUT MON AB 5-0 PS2 18 (SUTURE) ×4 IMPLANT
SUT PROLENE 3 0 PS 2 (SUTURE) ×12 IMPLANT
SYR 50ML LL SCALE MARK (SYRINGE) ×4 IMPLANT
SYR CONTROL 10ML LL (SYRINGE) IMPLANT
TAPE HYPAFIX 6X30 (GAUZE/BANDAGES/DRESSINGS) ×2 IMPLANT
TAPE MEASURE 72IN RETRACT (INSTRUMENTS) ×1
TAPE MEASURE LINEN 72IN RETRCT (INSTRUMENTS) IMPLANT
TAPE PAPER MEDFIX 1IN X 10YD (GAUZE/BANDAGES/DRESSINGS) ×2 IMPLANT
TOWEL OR NON WOVEN STRL DISP B (DISPOSABLE) ×2 IMPLANT
TUBE CONNECTING 20X1/4 (TUBING) ×2 IMPLANT
UNDERPAD 30X30 INCONTINENT (UNDERPADS AND DIAPERS) ×6 IMPLANT
VAC PENCILS W/TUBING CLEAR (MISCELLANEOUS) ×2 IMPLANT
WATER STERILE IRR 1000ML POUR (IV SOLUTION) ×2 IMPLANT
YANKAUER SUCT BULB TIP NO VENT (SUCTIONS) ×2 IMPLANT

## 2011-08-05 NOTE — Anesthesia Procedure Notes (Signed)
Procedure Name: Intubation Date/Time: 08/05/2011 7:48 AM Performed by: Caren Macadam Pre-anesthesia Checklist: Patient identified, Emergency Drugs available, Suction available and Patient being monitored Patient Re-evaluated:Patient Re-evaluated prior to inductionOxygen Delivery Method: Circle system utilized Preoxygenation: Pre-oxygenation with 100% oxygen Intubation Type: IV induction Ventilation: Mask ventilation without difficulty Laryngoscope Size: Miller and 2 Grade View: Grade I Tube type: Oral Tube size: 7.0 mm Number of attempts: 1 Placement Confirmation: ETT inserted through vocal cords under direct vision,  breath sounds checked- equal and bilateral and positive ETCO2 Secured at: 24 cm Tube secured with: Tape Dental Injury: Teeth and Oropharynx as per pre-operative assessment

## 2011-08-05 NOTE — Anesthesia Preprocedure Evaluation (Signed)
Anesthesia Evaluation  Patient identified by MRN, date of birth, ID band Patient awake    Reviewed: Allergy & Precautions, H&P , NPO status , Patient's Chart, lab work & pertinent test results, reviewed documented beta blocker date and time   Airway Mallampati: II TM Distance: >3 FB Neck ROM: full    Dental   Pulmonary neg pulmonary ROS,          Cardiovascular negative cardio ROS      Neuro/Psych negative neurological ROS  negative psych ROS   GI/Hepatic Neg liver ROS, GERD-  Medicated and Controlled,  Endo/Other  negative endocrine ROS  Renal/GU negative Renal ROS  negative genitourinary   Musculoskeletal   Abdominal   Peds  Hematology negative hematology ROS (+)   Anesthesia Other Findings See surgeon's H&P   Reproductive/Obstetrics negative OB ROS                           Anesthesia Physical Anesthesia Plan  ASA: II  Anesthesia Plan: General   Post-op Pain Management:    Induction: Intravenous  Airway Management Planned: Oral ETT  Additional Equipment:   Intra-op Plan:   Post-operative Plan: Extubation in OR  Informed Consent: I have reviewed the patients History and Physical, chart, labs and discussed the procedure including the risks, benefits and alternatives for the proposed anesthesia with the patient or authorized representative who has indicated his/her understanding and acceptance.     Plan Discussed with: CRNA and Surgeon  Anesthesia Plan Comments:         Anesthesia Quick Evaluation

## 2011-08-05 NOTE — Op Note (Signed)
NAME:  TIFFANI, KADOW                   ACCOUNT NO.:  MEDICAL RECORD NO.:  [id number]  LOCATION:                                 FACILITY:  PHYSICIAN:  Glorianna Gott L. Canisha Issac, M.D.DATE OF BIRTH:  01/10/1976  DATE OF PROCEDURE:  08/05/2011 DATE OF DISCHARGE:                              OPERATIVE REPORT   This 36 year old lady with increased macromastia, back shoulder pain secondary to large pendulous breasts, history of intertriginous changes, refractive to medications, pitting at shoulders.  PROCEDURES PLANNED:  Bilateral breast reductions using the inferior pedicle technique.  ANESTHESIA:  General.  DESCRIPTION OF PROCEDURE:  Preoperatively, the patient was set up and drawn out for the reduction mammoplasty.  We marked the nipple-areolar complex from over 37 cm to 22 cm.  She underwent general anesthesia, intubated orally.  Prep was done to the chest and breast areas in routine fashion using Hibiclens soap and solution, walled off with sterile towels and drapes so as to make a sterile field.  Xylocaine 0.25% with epinephrine 1:400,000 concentration was injected at each side subcutaneously and into the parenchyma, a total of 200 mL per side. These areas were allowed to set.  The walls were scored with #15 blade. Skin of the inferior pedicle was de-epithelialized with #20 blade. Medial and lateral fatty-dermal pedicles were incised down to underlying fascia.  Out laterally, more tissue was removed in the accessory portions sharply with the Bovie on coagulation.  Next, the new keyhole area was debulked with the Bovie unit.  After proper hemostasis, the flaps were transposed and stayed with 3-0 Prolene sutures.  Subcutaneous closure was done with 2 layers of 3-0 Monocryl subcutaneously and then a running subcuticular stitch of 3-0 Monocryl and 5-0 Monocryl throughout the inverted T.  The wounds were drained into this space with a #10 fully fluted Blake drains, which were best  placed in the depths of wound and brought out through the lateral-most portion of the incision and secured with 3-0 Prolene.  At the end of the procedure, nipple-areolar complexes were examined showing excellent blood supply.  After this, the wounds were cleansed, Steri-Strips and soft dressings were applied including Xeroform, 4x4s, ABDs, Hypafix tape.  ESTIMATED BLOOD LOSS:  Less than 100 mL.  COMPLICATIONS:  None.     Yaakov Guthrie. Shon Hough, M.D.     Cathie Hoops  D:  08/05/2011  T:  08/05/2011  Job:  [id number]

## 2011-08-05 NOTE — Anesthesia Postprocedure Evaluation (Signed)
Anesthesia Post Note  Patient: [name]  Procedure(s) Performed: Procedure(s) (LRB): MAMMARY REDUCTION  (BREAST) (Bilateral)  Anesthesia type: General  Patient location: PACU  Post pain: Pain level controlled  Post assessment: Patient's Cardiovascular Status Stable  Last Vitals:  Filed Vitals:   08/05/11 1100  BP: 127/80  Pulse: 91  Temp:   Resp: 13    Post vital signs: Reviewed and stable  Level of consciousness: alert  Complications: No apparent anesthesia complications

## 2011-08-05 NOTE — H&P (Signed)
[name] is an 36 y.o. female.   Chief Complaint:severe macromastia JXB:[id number] back and shoulder pain and intertrigo  Past Medical History  Diagnosis Date  . GERD (gastroesophageal reflux disease)   . Anemia   . Constipation   . Hx of endometriosis   . Macromastia 07/2011    Past Surgical History  Procedure Date  . Salpingectomy 12/12/2003    right; with TAH  . Cesarean section 12/29/2000; 1994  . Bladder neck reconstruction 01/14/2011    Procedure: BLADDER NECK REPAIR;  Surgeon: Reva Bores, MD;  Location: [facility];  Service: Gynecology;  Laterality: N/A;  Laparoscopic Repair of Incidental Cystotomy  . Ankle arthroscopy 12/29/2007    right; with extensive debridement  . Abdominal hysterectomy 12/12/2003  . Tubal ligation 12/29/2000  . Dilation and curettage of uterus 07/08/2003    open laparoscopy  . Repair peroneal tendons ankle 01/03/2010    repair right subluxing peroneal tendons  . Tonsillectomy as a child  . Adenoidectomy 05/2011  . Laparoscopic salpingoopherectomy 01/14/2011    left  . Right oophorectomy     with lysis of adhesions    Family History  Problem Relation Age of Onset  . Diabetes Mother   . Hypertension Mother   . Thyroid disease Sister    Social History:  reports that she has never smoked. She has never used smokeless tobacco. She reports that she does not drink alcohol or use illicit drugs.  Allergies: No Known Allergies  Medications Prior to Admission  Medication Dose Route Frequency Provider Last Rate Last Dose  . acetaminophen (OFIRMEV) IV 1,000 mg  1,000 mg Intravenous Once Hart Robinsons, MD      . lactated ringers infusion   Intravenous Continuous Hart Robinsons, MD      . midazolam (VERSED) injection 0.5-2 mg  0.5-2 mg Intravenous PRN Hart Robinsons, MD       Medications Prior to Admission  Medication Sig Dispense Refill  . docusate sodium (COLACE) 100 MG capsule Take 100 mg by mouth 1 day or 1 dose. MIDDAY      . esomeprazole  (NEXIUM) 40 MG capsule Take 40 mg by mouth daily. AM      . ferrous sulfate 325 (65 FE) MG tablet Take 325 mg by mouth daily.       . Nutritional Supplements (ESTROVEN PO) Take 1 tablet by mouth daily. AFTERNOON      . polyethylene glycol (MIRALAX / GLYCOLAX) packet Take 17 g by mouth every other day.         No results found for this or any previous visit (from the past 48 hour(s)). No results found.  Review of Systems  Constitutional: Negative.   HENT: Negative.   Eyes: Negative.   Respiratory: Negative.   Cardiovascular: Negative.   Gastrointestinal: Positive for heartburn.  Genitourinary: Negative.   Musculoskeletal: Negative.   Skin: Negative.   Neurological: Negative.   Endo/Heme/Allergies: Negative.   Psychiatric/Behavioral: Negative.     Blood pressure 110/83, pulse 82, temperature 97.8 F (36.6 C), temperature source Oral, resp. rate 20, height 5\' 5"  (1.651 m), weight 86.183 kg (190 lb), last menstrual period 12/27/2003, SpO2 100.00%. Physical Exam   Assessment/Plan Bilateral breast reductionsand  Excision of accessory breast tissue  Brittaney Beaulieu L 08/05/2011, 7:20 AM

## 2011-08-05 NOTE — Brief Op Note (Signed)
08/05/2011  10:00 AM  PATIENT:  [name]  36 y.o. female  PRE-OPERATIVE DIAGNOSIS:  macromastia  POST-OPERATIVE DIAGNOSIS:  macromastia  PROCEDURE:  Procedure(s) (LRB): MAMMARY REDUCTION  (BREAST) (Bilateral)  SURGEON:  Surgeon(s) and Role:    * Louisa Second, MD - Primary  PHYSICIAN ASSISTANT:none   ASSISTANTS: Jacqualine Code  ANESTHESIA:   general  EBL:  Total I/O In: 1700 [I.V.:1700] Out: -   BLOOD ADMINISTERED:none  DRAINS: (RIGHT AND LEFT LATERAL EDGES) Jackson-Pratt drain(s) with closed bulb suction in the    LOCAL MEDICATIONS USED:  XYLOCAINE   SPECIMEN:  Excision  DISPOSITION OF SPECIMEN:  PATHOLOGY  COUNTS:  YES  TOURNIQUET:  NONE  DICTATION: .Other Dictation: Dictation Number [id number]  PLAN OF CARE: Admit for overnight observation  PATIENT DISPOSITION:  PACU - hemodynamically stable.   Delay start of Pharmacological VTE agent (>24hrs) due to surgical blood loss or risk of bleeding: not applicable

## 2011-08-05 NOTE — Transfer of Care (Signed)
Immediate Anesthesia Transfer of Care Note  Patient: [name]  Procedure(s) Performed: Procedure(s) (LRB): MAMMARY REDUCTION  (BREAST) (Bilateral)  Patient Location: PACU  Anesthesia Type: General  Level of Consciousness: awake and alert   Airway & Oxygen Therapy: Patient Spontanous Breathing and Patient connected to face mask oxygen  Post-op Assessment: Report given to PACU RN and Post -op Vital signs reviewed and stable  Post vital signs: Reviewed and stable  Complications: No apparent anesthesia complications

## 2011-08-06 NOTE — Discharge Instructions (Signed)
Keep head elevated 40 degrees at home  No lifting  No driving  Keep dressings dry  Bulb Drain Home Care A bulb drain is a small, plastic reservoir which creates a gentle suction. It is used to remove excess fluid from a surgical wound. The color and amount of fluid will vary. Immediately after surgery, the fluid is bright red. It may gradually change to a yellow color. When the amount decreases to about 1 or 2 tablespoons (15 to 30 cc) per 24 hours, your caregiver will usually remove it. DAILY CARE Keep the bulb compressed at all times, except while emptying it. The compression creates suction.  Keep sites where the tubes enter the skin dry and covered with a light bandage (dressing).  Tape the tubes to your skin, 1 to 2 inches below the insertion sites, to keep from pulling on your stitches. Tubes are stitched in place and will not slip out.  Pin the bulb to your shirt (not to your pants) with a safety pin.  For the first few days after surgery, there usually is more fluid in the bulb. Empty the bulb whenever it becomes half full because the bulb does not create enough suction if it is too full. Include this amount in your 24 hour totals.  When the amount of drainage decreases, empty the bulb at the same time every day. Write down the amounts and the 24 hour totals. Your caregiver will want to know them. This helps your caregiver know when the tubes can be removed.  Once you are home, it is no longer necessary to strip the tubes as may have been done in the hospital. If there is drainage around the tube sites, change dressings and keep the area dry. If you see a clot in the tube, leave it alone. However, if the tube does not appear to be draining, let your caregiver know.  TO EMPTY THE BULB Open the stopper to release suction.  Holding the stopper out of the way, pour drainage into the measuring cup that was sent home with you.  Measure and write down the amount. If there are 2 bulbs, note the  amount of drainage from bulb 1 or bulb 2 and keep the totals separate. Your caregiver will want to know which tube is draining more.  Compress the bulb by folding it in half.  Replace the stopper.  Check the tape that holds the tube to your skin, and pin the bulb to your shirt.  SEEK MEDICAL CARE IF: The drainage develops a bad odor.  You have an oral temperature above 102 F (38.9 C).  The amount of drainage from your wound suddenly increases or decreases.  You accidentally pull out your drain.  You have any other questions or concerns.  MAKE SURE YOU:  Understand these instructions.  Will watch your condition.  Will get help right away if you are not doing well or get worse.  Document Released: 04/19/2000 Document Revised: 04/11/2011 Document Reviewed: 04/22/2005 [facility] Patient Information 2012 [place], [place]. SEEK MEDICAL CARE IF:  The drainage develops a bad odor.   You have an oral temperature above 102 F (38.9 C).   The amount of drainage from your wound suddenly increases or decreases.   You accidentally pull out your drain.   You have any other questions or concerns.  MAKE SURE YOU:   Understand these instructions.   Will watch your condition.   Will get help right away if you are not doing  well or get worse.    l

## 2011-08-07 LAB — POCT I-STAT, CHEM 8
BUN: 3 mg/dL — ABNORMAL LOW (ref 6–23)
Calcium, Ion: 1.24 mmol/L (ref 1.12–1.32)
Chloride: 103 mEq/L (ref 96–112)
Creatinine, Ser: 0.8 mg/dL (ref 0.50–1.10)
Glucose, Bld: 73 mg/dL (ref 70–99)
HCT: 33 % — ABNORMAL LOW (ref 36.0–46.0)
Hemoglobin: 11.2 g/dL — ABNORMAL LOW (ref 12.0–15.0)
Potassium: 3.4 mEq/L — ABNORMAL LOW (ref 3.5–5.1)
Sodium: 143 mEq/L (ref 135–145)
TCO2: 29 mmol/L (ref 0–100)

## 2011-08-08 ENCOUNTER — Encounter (HOSPITAL_BASED_OUTPATIENT_CLINIC_OR_DEPARTMENT_OTHER): Payer: Self-pay | Admitting: Specialist

## 2011-08-29 ENCOUNTER — Emergency Department (INDEPENDENT_AMBULATORY_CARE_PROVIDER_SITE_OTHER): Payer: BC Managed Care – PPO

## 2011-08-29 ENCOUNTER — Emergency Department (HOSPITAL_COMMUNITY)
Admission: EM | Admit: 2011-08-29 | Discharge: 2011-08-29 | Disposition: A | Discharge status: Home / Self Care | Payer: BC Managed Care – PPO | Source: Home / Self Care | Attending: Family Medicine | Admitting: Family Medicine

## 2011-08-29 ENCOUNTER — Encounter (HOSPITAL_COMMUNITY): Payer: Self-pay | Admitting: Emergency Medicine

## 2011-08-29 DIAGNOSIS — IMO0002 Reserved for concepts with insufficient information to code with codable children: Secondary | ICD-10-CM

## 2011-08-29 DIAGNOSIS — S8390XA Sprain of unspecified site of unspecified knee, initial encounter: Secondary | ICD-10-CM

## 2011-08-29 MED ORDER — PREDNISONE (PAK) 10 MG PO TABS: ORAL_TABLET | ORAL | Status: DC

## 2011-08-29 NOTE — ED Provider Notes (Signed)
History     CSN: [id number]  Arrival date & time 08/29/11  1248   First MD Initiated Contact with Patient 08/29/11 1250      Chief Complaint  Patient presents with  . Knee Pain    (Consider location/radiation/quality/duration/timing/severity/associated sxs/prior treatment) HPI Comments: The patient reports falling and landing on her right knee 2 months ago. Has continued intermittent pain. States sh has had 3 surgeries on her right ankle and that it just gave way. She has been icing with no relief. Denies locking.   The history is provided by the patient.    Past Medical History  Diagnosis Date  . GERD (gastroesophageal reflux disease)   . Anemia   . Constipation   . Hx of endometriosis   . Macromastia 07/2011    Past Surgical History  Procedure Date  . Salpingectomy 12/12/2003    right; with TAH  . Cesarean section 12/29/2000; 1994  . Bladder neck reconstruction 01/14/2011    Procedure: BLADDER NECK REPAIR;  Surgeon: Reva Bores, MD;  Location: [facility];  Service: Gynecology;  Laterality: N/A;  Laparoscopic Repair of Incidental Cystotomy  . Ankle arthroscopy 12/29/2007    right; with extensive debridement  . Abdominal hysterectomy 12/12/2003  . Tubal ligation 12/29/2000  . Dilation and curettage of uterus 07/08/2003    open laparoscopy  . Repair peroneal tendons ankle 01/03/2010    repair right subluxing peroneal tendons  . Tonsillectomy as a child  . Adenoidectomy 05/2011  . Laparoscopic salpingoopherectomy 01/14/2011    left  . Right oophorectomy     with lysis of adhesions  . Breast reduction surgery 08/05/2011    Procedure: MAMMARY REDUCTION  (BREAST);  Surgeon: Louisa Second, MD;  Location: [facility];  Service: Plastics;  Laterality: Bilateral;  bilateral    Family History  Problem Relation Age of Onset  . Diabetes Mother   . Hypertension Mother   . Thyroid disease Sister     History  Substance Use Topics  . Smoking status: Never Smoker   .  Smokeless tobacco: Never Used  . Alcohol Use: No    OB History    Grav Para Term Preterm Abortions TAB SAB Ect Mult Living   2 2 2       2       Review of Systems  Constitutional: Negative.   HENT: Negative.   Respiratory: Negative.   Cardiovascular: Negative.   Gastrointestinal: Negative.   Genitourinary: Negative.   Neurological: Negative for weakness and numbness.    Allergies  Review of patient's allergies indicates no known allergies.  Home Medications   Current Outpatient Rx  Name Route Sig Dispense Refill  . ESOMEPRAZOLE MAGNESIUM 40 MG PO CPDR Oral Take 40 mg by mouth daily. AM    . POLYETHYLENE GLYCOL 3350 PO PACK Oral Take 17 g by mouth every other day.     [name] PREDNISONE (PAK) 10 MG PO TABS  Disp one 6 days taper Take as directed with food 21 tablet 0    BP 107/77  Pulse 77  Temp(Src) 98.4 F (36.9 C) (Oral)  Resp 18  SpO2 100%  LMP 12/27/2003  Physical Exam  Nursing note and vitals reviewed. Constitutional: She appears well-developed and well-nourished. No distress.  Cardiovascular: Normal rate and regular rhythm.   Pulmonary/Chest: Effort normal.  Musculoskeletal:       eval of the right knee reveals no swelling or effusion. Tender to palpation medical to the patella. Ligaments taut. Negative drawer.  Skin: Skin is warm and dry.    ED Course  Procedures (including critical care time) X ray appears neg for acute findings Labs Reviewed - No data to display Dg Knee Complete 4 Views Right  08/29/2011  *RADIOLOGY REPORT*  Clinical Data: fall several months ago.  Pain.  RIGHT KNEE - COMPLETE 4+ VIEW  Comparison: None.  Findings: No acute fracture and no dislocation.  Unremarkable soft tissues.  IMPRESSION: No acute bony pathology.  Original Report Authenticated By: Donavan Burnet, M.D.     1. Knee sprain       MDM          Randa Spike, MD 08/29/11 [phone]

## 2011-08-29 NOTE — ED Notes (Signed)
PT HERE WITH RIGHT KNEE THROB PAIN INTERMITT THAT HAS BEEN ONGING X 2 MNTHS POST FALL AFTER LEG GAVE OUT ON HER.PT STATES SHE HAS HAD X 3 SURG ON R ANKLE/FOOT,LAST ONE 2011 AND HAS HAD PROBLEMS SINCE.NO SWELLING OR BRUISING NOTED.

## 2011-08-29 NOTE — Discharge Instructions (Signed)
Wear the sleeve when up and about. Call ortho for an appt. Apply heat three times daily x 15 min.

## 2011-12-23 ENCOUNTER — Encounter (HOSPITAL_COMMUNITY): Payer: Self-pay | Admitting: *Deleted

## 2011-12-23 ENCOUNTER — Emergency Department (HOSPITAL_COMMUNITY)
Admission: EM | Admit: 2011-12-23 | Discharge: 2011-12-23 | Disposition: A | Discharge status: Home / Self Care | Payer: BC Managed Care – PPO | Attending: Emergency Medicine | Admitting: Emergency Medicine

## 2011-12-23 DIAGNOSIS — B9789 Other viral agents as the cause of diseases classified elsewhere: Secondary | ICD-10-CM | POA: Insufficient documentation

## 2011-12-23 DIAGNOSIS — B349 Viral infection, unspecified: Secondary | ICD-10-CM

## 2011-12-23 DIAGNOSIS — D649 Anemia, unspecified: Secondary | ICD-10-CM

## 2011-12-23 DIAGNOSIS — K219 Gastro-esophageal reflux disease without esophagitis: Secondary | ICD-10-CM | POA: Insufficient documentation

## 2011-12-23 DIAGNOSIS — R51 Headache: Secondary | ICD-10-CM

## 2011-12-23 LAB — URINALYSIS, ROUTINE W REFLEX MICROSCOPIC
Bilirubin Urine: NEGATIVE
Glucose, UA: NEGATIVE mg/dL
Hgb urine dipstick: NEGATIVE
Ketones, ur: NEGATIVE mg/dL
Leukocytes, UA: NEGATIVE
Nitrite: NEGATIVE
Protein, ur: NEGATIVE mg/dL
Specific Gravity, Urine: 1.011 (ref 1.005–1.030)
Urobilinogen, UA: 0.2 mg/dL (ref 0.0–1.0)
pH: 6 (ref 5.0–8.0)

## 2011-12-23 LAB — BASIC METABOLIC PANEL
BUN: 5 mg/dL — ABNORMAL LOW (ref 6–23)
CO2: 30 mEq/L (ref 19–32)
Calcium: 9.3 mg/dL (ref 8.4–10.5)
Chloride: 104 mEq/L (ref 96–112)
Creatinine, Ser: 0.8 mg/dL (ref 0.50–1.10)
GFR calc Af Amer: >90 mL/min (ref >90)
GFR calc non Af Amer: >90 mL/min (ref >90)
Glucose, Bld: 79 mg/dL (ref 70–99)
Potassium: 3.3 mEq/L — ABNORMAL LOW (ref 3.5–5.1)
Sodium: 139 mEq/L (ref 135–145)

## 2011-12-23 LAB — CBC WITH DIFFERENTIAL/PLATELET
Basophils Absolute: 0 10*3/uL (ref 0.0–0.1)
Basophils Relative: 1 % (ref 0–1)
Eosinophils Absolute: 0.2 10*3/uL (ref 0.0–0.7)
Eosinophils Relative: 3 % (ref 0–5)
HCT: 30.6 % — ABNORMAL LOW (ref 36.0–46.0)
Hemoglobin: 9.8 g/dL — ABNORMAL LOW (ref 12.0–15.0)
Lymphocytes Relative: 54 % — ABNORMAL HIGH (ref 12–46)
Lymphs Abs: 3.3 10*3/uL (ref 0.7–4.0)
MCH: 24.4 pg — ABNORMAL LOW (ref 26.0–34.0)
MCHC: 32 g/dL (ref 30.0–36.0)
MCV: 76.3 fL — ABNORMAL LOW (ref 78.0–100.0)
Monocytes Absolute: 0.4 10*3/uL (ref 0.1–1.0)
Monocytes Relative: 7 % (ref 3–12)
Neutro Abs: 2.2 10*3/uL (ref 1.7–7.7)
Neutrophils Relative %: 36 % — ABNORMAL LOW (ref 43–77)
Platelets: 279 10*3/uL (ref 150–400)
RBC: 4.01 MIL/uL (ref 3.87–5.11)
RDW: 13.8 % (ref 11.5–15.5)
WBC: 6 10*3/uL (ref 4.0–10.5)

## 2011-12-23 MED ORDER — BUTALBITAL-APAP-CAFFEINE 50-325-40 MG PO TABS: 2.0000 | ORAL_TABLET | Freq: Four times a day (QID) | ORAL | Status: AC | PRN

## 2011-12-23 MED ORDER — BUTALBITAL-APAP-CAFFEINE 50-325-40 MG PO TABS
2.0000 | ORAL_TABLET | Freq: Once | ORAL | Status: AC
  Administered 2011-12-23: 2 via ORAL
  Filled 2011-12-23: qty 2

## 2011-12-23 NOTE — ED Notes (Signed)
Pt c/o generalized fatigue for a few days; hoarseness; c/o headache tonight with neck stiffness; able to bend chin to chest/shoulders without difficulty

## 2011-12-23 NOTE — ED Provider Notes (Signed)
History     CSN: [id number]  Arrival date & time 12/23/11  0000   First MD Initiated Contact with Patient 12/23/11 0151      Chief Complaint  Patient presents with  . Headache    (Consider location/radiation/quality/duration/timing/severity/associated sxs/prior treatment) HPI 36 year old female presents to emergency room complaining of generalized fatigue, myalgias, sore throat, hoarse voice and headache. She reports she started with a sore throat on Thursday, noticed that she was developing some hoarseness. Over the weekend she has overall felt unwell. Patient has had intermittent headaches with some nausea. Headache is generalized mainly across the front of her head. She has some posterior neck pain as well, but no stiffness. She denies any fevers no sick contacts no rash no tick bites no previous similar symptoms. Patient denies photophobia or phonophobia. Patient has noticed some increased urination. She reports she's been having problems with her memory over the last several months. Patient reports history of anemia and IBS. She has had rhinorrhea and congestion. Sore throat is worse later on in the day. She denies any cough  Past Medical History  Diagnosis Date  . GERD (gastroesophageal reflux disease)   . Anemia   . Constipation   . Hx of endometriosis   . Macromastia 07/2011    Past Surgical History  Procedure Date  . Salpingectomy 12/12/2003    right; with TAH  . Cesarean section 12/29/2000; 1994  . Bladder neck reconstruction 01/14/2011    Procedure: BLADDER NECK REPAIR;  Surgeon: Reva Bores, MD;  Location: [facility];  Service: Gynecology;  Laterality: N/A;  Laparoscopic Repair of Incidental Cystotomy  . Ankle arthroscopy 12/29/2007    right; with extensive debridement  . Abdominal hysterectomy 12/12/2003  . Tubal ligation 12/29/2000  . Dilation and curettage of uterus 07/08/2003    open laparoscopy  . Repair peroneal tendons ankle 01/03/2010    repair right subluxing peroneal  tendons  . Tonsillectomy as a child  . Adenoidectomy 05/2011  . Laparoscopic salpingoopherectomy 01/14/2011    left  . Right oophorectomy     with lysis of adhesions  . Breast reduction surgery 08/05/2011    Procedure: MAMMARY REDUCTION  (BREAST);  Surgeon: Louisa Second, MD;  Location: [facility];  Service: Plastics;  Laterality: Bilateral;  bilateral    Family History  Problem Relation Age of Onset  . Diabetes Mother   . Hypertension Mother   . Thyroid disease Sister     History  Substance Use Topics  . Smoking status: Never Smoker   . Smokeless tobacco: Never Used  . Alcohol Use: No    OB History    Grav Para Term Preterm Abortions TAB SAB Ect Mult Living   2 2 2       2       Review of Systems  All other systems reviewed and are negative.    Allergies  Review of patient's allergies indicates no known allergies.  Home Medications   Current Outpatient Rx  Name Route Sig Dispense Refill  . DOCUSATE SODIUM 100 MG PO CAPS Oral Take 100 mg by mouth 2 (two) times daily.    [name] ESOMEPRAZOLE MAGNESIUM 40 MG PO CPDR Oral Take 40 mg by mouth daily. AM    . POLYETHYLENE GLYCOL 3350 PO PACK Oral Take 17 g by mouth every other day.       BP 118/75  Pulse 56  Temp 98.2 F (36.8 C) (Oral)  Resp 20  Wt 198 lb (89.812 kg)  SpO2 100%  LMP 12/27/2003  Physical Exam  Nursing note and vitals reviewed. Constitutional: She is oriented to person, place, and time. She appears well-developed and well-nourished.  HENT:  Head: Normocephalic and atraumatic.  Right Ear: External ear normal.  Left Ear: External ear normal.  Mouth/Throat: Oropharynx is clear and moist.       Rhinorrhea noted  Eyes: Conjunctivae and EOM are normal. Pupils are equal, round, and reactive to light.  Neck: Normal range of motion. Neck supple. No JVD present. No tracheal deviation present. No thyromegaly present.       Mild paraspinal muscle tenderness without stiffness or meningeal signs    Cardiovascular: Normal rate, regular rhythm, normal heart sounds and intact distal pulses.  Exam reveals no gallop and no friction rub.   No murmur heard. Pulmonary/Chest: Effort normal and breath sounds normal. No stridor. No respiratory distress. She has no wheezes. She has no rales. She exhibits no tenderness.  Abdominal: Soft. Bowel sounds are normal. She exhibits no distension and no mass. There is no tenderness. There is no rebound and no guarding.  Musculoskeletal: Normal range of motion. She exhibits no edema and no tenderness.  Lymphadenopathy:    She has no cervical adenopathy.  Neurological: She is alert and oriented to person, place, and time. She exhibits normal muscle tone. Coordination normal.  Skin: Skin is warm and dry. No rash noted. No erythema. No pallor.  Psychiatric: She has a normal mood and affect. Her behavior is normal. Judgment and thought content normal.    ED Course  Procedures (including critical care time)  Labs Reviewed  CBC WITH DIFFERENTIAL - Abnormal; Notable for the following:    Hemoglobin 9.8 (*)     HCT 30.6 (*)     MCV 76.3 (*)     MCH 24.4 (*)     Neutrophils Relative 36 (*)     Lymphocytes Relative 54 (*)     All other components within normal limits  BASIC METABOLIC PANEL - Abnormal; Notable for the following:    Potassium 3.3 (*)     BUN 5 (*)     All other components within normal limits  URINALYSIS, ROUTINE W REFLEX MICROSCOPIC   No results found.   1. Headache   2. Viral syndrome   3. Anemia       MDM  36 year old female with arthralgias myalgias sore throat and headache. Suspect viral illness and may be tension headache. We'll check baseline labs give Fioricet.        Olivia Mackie, MD 12/23/11 [phone]

## 2012-01-08 ENCOUNTER — Emergency Department (HOSPITAL_COMMUNITY)
Admission: EM | Admit: 2012-01-08 | Discharge: 2012-01-08 | Disposition: A | Discharge status: Home / Self Care | Payer: Medicaid Other | Attending: Emergency Medicine | Admitting: Emergency Medicine

## 2012-01-08 ENCOUNTER — Emergency Department (HOSPITAL_COMMUNITY): Payer: Medicaid Other

## 2012-01-08 ENCOUNTER — Encounter (HOSPITAL_COMMUNITY): Payer: Self-pay | Admitting: *Deleted

## 2012-01-08 DIAGNOSIS — K219 Gastro-esophageal reflux disease without esophagitis: Secondary | ICD-10-CM | POA: Insufficient documentation

## 2012-01-08 DIAGNOSIS — N62 Hypertrophy of breast: Secondary | ICD-10-CM | POA: Insufficient documentation

## 2012-01-08 DIAGNOSIS — R109 Unspecified abdominal pain: Secondary | ICD-10-CM | POA: Insufficient documentation

## 2012-01-08 DIAGNOSIS — Z79899 Other long term (current) drug therapy: Secondary | ICD-10-CM | POA: Insufficient documentation

## 2012-01-08 DIAGNOSIS — Z8742 Personal history of other diseases of the female genital tract: Secondary | ICD-10-CM | POA: Insufficient documentation

## 2012-01-08 DIAGNOSIS — K5900 Constipation, unspecified: Secondary | ICD-10-CM | POA: Insufficient documentation

## 2012-01-08 DIAGNOSIS — D649 Anemia, unspecified: Secondary | ICD-10-CM | POA: Insufficient documentation

## 2012-01-08 LAB — URINALYSIS, ROUTINE W REFLEX MICROSCOPIC
Bilirubin Urine: NEGATIVE
Glucose, UA: NEGATIVE mg/dL
Hgb urine dipstick: NEGATIVE
Ketones, ur: NEGATIVE mg/dL
Leukocytes, UA: NEGATIVE
Nitrite: NEGATIVE
Protein, ur: NEGATIVE mg/dL
Specific Gravity, Urine: 1.005 (ref 1.005–1.030)
Urobilinogen, UA: 0.2 mg/dL (ref 0.0–1.0)
pH: 7.5 (ref 5.0–8.0)

## 2012-01-08 LAB — COMPREHENSIVE METABOLIC PANEL
ALT: 18 U/L (ref 0–35)
AST: 25 U/L (ref 0–37)
Albumin: 4 g/dL (ref 3.5–5.2)
Alkaline Phosphatase: 93 U/L (ref 39–117)
BUN: 7 mg/dL (ref 6–23)
CO2: 31 mEq/L (ref 19–32)
Calcium: 9.5 mg/dL (ref 8.4–10.5)
Chloride: 103 mEq/L (ref 96–112)
Creatinine, Ser: 0.79 mg/dL (ref 0.50–1.10)
GFR calc Af Amer: >90 mL/min (ref >90)
GFR calc non Af Amer: >90 mL/min (ref >90)
Glucose, Bld: 102 mg/dL — ABNORMAL HIGH (ref 70–99)
Potassium: 4.1 mEq/L (ref 3.5–5.1)
Sodium: 140 mEq/L (ref 135–145)
Total Bilirubin: 0.1 mg/dL — ABNORMAL LOW (ref 0.3–1.2)
Total Protein: 7.9 g/dL (ref 6.0–8.3)

## 2012-01-08 LAB — CBC
HCT: 33.5 % — ABNORMAL LOW (ref 36.0–46.0)
Hemoglobin: 10.7 g/dL — ABNORMAL LOW (ref 12.0–15.0)
MCH: 23.7 pg — ABNORMAL LOW (ref 26.0–34.0)
MCHC: 31.9 g/dL (ref 30.0–36.0)
MCV: 74.3 fL — ABNORMAL LOW (ref 78.0–100.0)
Platelets: 310 10*3/uL (ref 150–400)
RBC: 4.51 MIL/uL (ref 3.87–5.11)
RDW: 13.9 % (ref 11.5–15.5)
WBC: 7.5 10*3/uL (ref 4.0–10.5)

## 2012-01-08 MED ORDER — DOCUSATE SODIUM 100 MG PO CAPS: 100.0000 mg | ORAL_CAPSULE | Freq: Two times a day (BID) | ORAL | Status: AC

## 2012-01-08 MED ORDER — GLYCERIN (LAXATIVE) 2 G RE SUPP: 1.0000 | Freq: Three times a day (TID) | RECTAL | Status: DC

## 2012-01-08 MED ORDER — SODIUM CHLORIDE 0.9 % IV BOLUS (SEPSIS)
2000.0000 mL | Freq: Once | INTRAVENOUS | Status: AC
  Administered 2012-01-08: 1000 mL via INTRAVENOUS

## 2012-01-08 MED ORDER — LACTULOSE 10 GM/15ML PO SOLN
20.0000 g | Freq: Once | ORAL | Status: AC
  Administered 2012-01-08: 20 g via ORAL
  Filled 2012-01-08: qty 30

## 2012-01-08 MED ORDER — GLYCERIN (LAXATIVE) 2.1 G RE SUPP
1.0000 | Freq: Once | RECTAL | Status: AC
  Administered 2012-01-08: 1 via RECTAL
  Filled 2012-01-08: qty 1

## 2012-01-08 MED ORDER — LACTULOSE 20 G PO PACK: 20.0000 g | PACK | Freq: Two times a day (BID) | ORAL | Status: AC

## 2012-01-08 MED ORDER — LIDOCAINE HCL 2 % EX GEL
CUTANEOUS | Status: AC
  Administered 2012-01-08: 18:00:00
  Filled 2012-01-08: qty 10

## 2012-01-08 NOTE — ED Notes (Signed)
Pt reports constipation-LBM was Sunday.  Pt reports using fleets enema, drinking magcitrate, and other laxatives.  Pt reports waking up this am with nausea.  Pt also reports internal hemorrhoids causing rectal pain.

## 2012-01-08 NOTE — ED Provider Notes (Signed)
History     CSN: [id number]  Arrival date & time 01/08/12  1222   First MD Initiated Contact with Patient 01/08/12 1349      Chief Complaint  Patient presents with  . Constipation    (Consider location/radiation/quality/duration/timing/severity/associated sxs/prior treatment) HPI The patient presents to the ED with a chief complaint of constipation.  She has a history of internal hemorrhoids for the past 3 years that have made defecation difficult and painful.  The patient states that her last BM was Sunday and "not a lot came out."  The patient states that she had tried multiple over the counter remedies such as suppositories, Mirolax, and "the stuff you drink before surgery."  She has not had any relief with any of these interventions.  The patient reports that she had nausea starting today.  She denies fever, chills, headache, chest pain, SOB, vomiting, and dysuria.    Past Medical History  Diagnosis Date  . GERD (gastroesophageal reflux disease)   . Anemia   . Constipation   . Hx of endometriosis   . Macromastia 07/2011    Past Surgical History  Procedure Date  . Salpingectomy 12/12/2003    right; with TAH  . Cesarean section 12/29/2000; 1994  . Bladder neck reconstruction 01/14/2011    Procedure: BLADDER NECK REPAIR;  Surgeon: Reva Bores, MD;  Location: [facility];  Service: Gynecology;  Laterality: N/A;  Laparoscopic Repair of Incidental Cystotomy  . Ankle arthroscopy 12/29/2007    right; with extensive debridement  . Abdominal hysterectomy 12/12/2003  . Tubal ligation 12/29/2000  . Dilation and curettage of uterus 07/08/2003    open laparoscopy  . Repair peroneal tendons ankle 01/03/2010    repair right subluxing peroneal tendons  . Tonsillectomy as a child  . Adenoidectomy 05/2011  . Laparoscopic salpingoopherectomy 01/14/2011    left  . Right oophorectomy     with lysis of adhesions  . Breast reduction surgery 08/05/2011    Procedure: MAMMARY REDUCTION  (BREAST);  Surgeon:  Louisa Second, MD;  Location: [facility];  Service: Plastics;  Laterality: Bilateral;  bilateral    Family History  Problem Relation Age of Onset  . Diabetes Mother   . Hypertension Mother   . Thyroid disease Sister     History  Substance Use Topics  . Smoking status: Never Smoker   . Smokeless tobacco: Never Used  . Alcohol Use: No    OB History    Grav Para Term Preterm Abortions TAB SAB Ect Mult Living   2 2 2       2       Review of Systems All other systems negative except as documented in the HPI. All pertinent positives and negatives as reviewed in the HPI.  Allergies  Review of patient's allergies indicates no known allergies.  Home Medications   Current Outpatient Rx  Name Route Sig Dispense Refill  . DOCUSATE SODIUM 100 MG PO CAPS Oral Take 100 mg by mouth 2 (two) times daily.    [name] ESOMEPRAZOLE MAGNESIUM 40 MG PO CPDR Oral Take 40 mg by mouth daily. AM    . LINACLOTIDE 290 MCG PO CAPS Oral Take 1 capsule by mouth daily.    [name] POLYETHYLENE GLYCOL 3350 PO PACK Oral Take 17 g by mouth every other day.       BP 122/75  Pulse 92  Temp 99.2 F (37.3 C) (Oral)  Resp 16  Ht 5\' 5"  (1.651 m)  Wt 195 lb (88.451  kg)  BMI 32.45 kg/m2  SpO2 100%  LMP 12/27/2003  Physical Exam  Constitutional: She appears well-developed and well-nourished. No distress.  HENT:  Head: Normocephalic and atraumatic.  Neck: Normal range of motion. Neck supple. No thyromegaly present.  Cardiovascular: Normal rate, regular rhythm, normal heart sounds and intact distal pulses.   No murmur heard. Pulmonary/Chest: Effort normal and breath sounds normal. No respiratory distress. She has no wheezes. She has no rales. She exhibits no tenderness.  Abdominal: Soft. Bowel sounds are decreased. There is tenderness in the suprapubic area. There is no rebound, no tenderness at McBurney's point and negative Murphy's sign.    Genitourinary:        No perianal masses or lesions.    Musculoskeletal: She exhibits no edema.  Lymphadenopathy:    She has no cervical adenopathy.  Neurological: She is alert.  Skin: Skin is dry. No rash noted. She is not diaphoretic. No erythema.  Psychiatric: She has a normal mood and affect. Her behavior is normal. Judgment and thought content normal.    ED Course  Procedures (including critical care time)   Labs Reviewed  CBC - Abnormal; Notable for the following:    Hemoglobin 10.7 (*)     HCT 33.5 (*)     MCV 74.3 (*)     MCH 23.7 (*)     All other components within normal limits  COMPREHENSIVE METABOLIC PANEL - Abnormal; Notable for the following:    Glucose, Bld 102 (*)     Total Bilirubin 0.1 (*)     All other components within normal limits  URINALYSIS, ROUTINE W REFLEX MICROSCOPIC   Dg Abd Acute W/chest  01/08/2012  *RADIOLOGY REPORT*  Clinical Data: Diffuse abdominal pain constipation.  ACUTE ABDOMEN SERIES (ABDOMEN 2 VIEW & CHEST 1 VIEW)  Comparison: CT scan from 02/03/2010  Findings: Symmetric nodular densities projecting over the lung bases are compatible with nipple shadows.  Lungs are clear.  Heart silhouette is normal. Imaged bony structures of the thorax are intact.  Upright film shows no evidence for intraperitoneal free air. Supine film shows no gaseous bowel dilatation to suggest obstruction.  Air and stool are seen scattered along the length of a nondilated colon.  There is a prominent amount of stool in the rectum measuring up to 10 cm in diameter.  IMPRESSION: No evidence for acute cardiopulmonary findings.  No bowel obstruction or perforation.  Prominent stool volume in the rectum would be compatible with clinical constipation.  A   Original Report Authenticated By: ERIC A. MANSELL, M.D.     No diagnosis found.  Patient presents with constipation.  CBC, CMP, UA ordered to assess infection, electrolytes, urinary tract infection.  Plain films of the abdomen and chest ordered.  Imaging shows large amount of stool  in rectum suggesting clinical constipation.  Fluids, suppository, and laxative ordered to try and alleviate constipation.     Was unable to feel any stool in her rectal vault.  7:55 pm - rechecked patient, she is now currently receiving IV Fluids and feeling about the same as before.    MDM  Patient history and presentation obtained.  Labs and imaging reviewed and suggest clinical constipation.  Fluids, suppository, and laxative ordered to attempt to alleviate constipation.        Carlyle Dolly, PA-C 01/08/12 2026

## 2012-01-08 NOTE — ED Notes (Signed)
Attempted 3 times to start IV, IV team notified.

## 2012-01-08 NOTE — Discharge Instructions (Signed)
Return here as needed. Follow up with your doctor. Use fleets enemas and warm prune juice.

## 2012-01-09 NOTE — ED Provider Notes (Signed)
Medical screening examination/treatment/procedure(s) were performed by non-physician practitioner and as supervising physician I was immediately available for consultation/collaboration.   Richardean Canal, MD 01/09/12 1021

## 2012-03-13 ENCOUNTER — Encounter (HOSPITAL_COMMUNITY): Payer: Self-pay | Admitting: Emergency Medicine

## 2012-03-13 ENCOUNTER — Emergency Department (HOSPITAL_COMMUNITY)
Admission: EM | Admit: 2012-03-13 | Discharge: 2012-03-13 | Disposition: A | Discharge status: Home / Self Care | Payer: Medicaid Other | Attending: Emergency Medicine | Admitting: Emergency Medicine

## 2012-03-13 DIAGNOSIS — Z9889 Other specified postprocedural states: Secondary | ICD-10-CM | POA: Insufficient documentation

## 2012-03-13 DIAGNOSIS — D649 Anemia, unspecified: Secondary | ICD-10-CM | POA: Insufficient documentation

## 2012-03-13 DIAGNOSIS — M25579 Pain in unspecified ankle and joints of unspecified foot: Secondary | ICD-10-CM | POA: Insufficient documentation

## 2012-03-13 DIAGNOSIS — M25529 Pain in unspecified elbow: Secondary | ICD-10-CM | POA: Insufficient documentation

## 2012-03-13 DIAGNOSIS — R5381 Other malaise: Secondary | ICD-10-CM | POA: Insufficient documentation

## 2012-03-13 DIAGNOSIS — K219 Gastro-esophageal reflux disease without esophagitis: Secondary | ICD-10-CM | POA: Insufficient documentation

## 2012-03-13 DIAGNOSIS — Z79899 Other long term (current) drug therapy: Secondary | ICD-10-CM | POA: Insufficient documentation

## 2012-03-13 DIAGNOSIS — R5383 Other fatigue: Secondary | ICD-10-CM | POA: Insufficient documentation

## 2012-03-13 DIAGNOSIS — K5900 Constipation, unspecified: Secondary | ICD-10-CM | POA: Insufficient documentation

## 2012-03-13 DIAGNOSIS — Z8742 Personal history of other diseases of the female genital tract: Secondary | ICD-10-CM | POA: Insufficient documentation

## 2012-03-13 DIAGNOSIS — M79603 Pain in arm, unspecified: Secondary | ICD-10-CM

## 2012-03-13 MED ORDER — HYDROCODONE-ACETAMINOPHEN 5-325 MG PO TABS: ORAL_TABLET | ORAL | Status: DC

## 2012-03-13 MED ORDER — MELOXICAM 7.5 MG PO TABS: 7.5000 mg | ORAL_TABLET | Freq: Every day | ORAL | Status: DC

## 2012-03-13 NOTE — ED Provider Notes (Signed)
Medical screening examination/treatment/procedure(s) were performed by non-physician practitioner and as supervising physician I was immediately available for consultation/collaboration.   David H Yao, MD 03/13/12 1602 

## 2012-03-13 NOTE — ED Provider Notes (Signed)
History     CSN: [id number]  Arrival date & time 03/13/12  8657   First MD Initiated Contact with Patient 03/13/12 [phone]      Chief Complaint  Patient presents with  . Arm Pain    (Consider location/radiation/quality/duration/timing/severity/associated sxs/prior treatment) HPI Comments: Patient presents with complaint of right wrist, forearm, and elbow pain for the past one month. Pain is worse with movement. Patient states that she feels weak and is now having trouble opening bottles and cans. She has a history of a carpal release in that wrist. She denies numbness or tingling. She's been taking ibuprofen and wearing a brace but this has not been helping her. Patient also complains of right ankle pain for the past several months. Patient has a history of 2 surgical repairs of this ankle after a bad ankle sprain. No new injury. Medications are not helping for this either. Walking makes it worse. Nothing makes it better. Onset of symptoms gradual. Course is constant. Patient denies neck or right shoulder pain.  The history is provided by the patient.    Past Medical History  Diagnosis Date  . GERD (gastroesophageal reflux disease)   . Anemia   . Constipation   . Hx of endometriosis   . Macromastia 07/2011    Past Surgical History  Procedure Date  . Salpingectomy 12/12/2003    right; with TAH  . Cesarean section 12/29/2000; 1994  . Bladder neck reconstruction 01/14/2011    Procedure: BLADDER NECK REPAIR;  Surgeon: Reva Bores, MD;  Location: [facility];  Service: Gynecology;  Laterality: N/A;  Laparoscopic Repair of Incidental Cystotomy  . Ankle arthroscopy 12/29/2007    right; with extensive debridement  . Abdominal hysterectomy 12/12/2003  . Tubal ligation 12/29/2000  . Dilation and curettage of uterus 07/08/2003    open laparoscopy  . Repair peroneal tendons ankle 01/03/2010    repair right subluxing peroneal tendons  . Tonsillectomy as a child  . Adenoidectomy 05/2011  . Laparoscopic  salpingoopherectomy 01/14/2011    left  . Right oophorectomy     with lysis of adhesions  . Breast reduction surgery 08/05/2011    Procedure: MAMMARY REDUCTION  (BREAST);  Surgeon: Louisa Second, MD;  Location: [facility];  Service: Plastics;  Laterality: Bilateral;  bilateral    Family History  Problem Relation Age of Onset  . Diabetes Mother   . Hypertension Mother   . Thyroid disease Sister     History  Substance Use Topics  . Smoking status: Never Smoker   . Smokeless tobacco: Never Used  . Alcohol Use: No    OB History    Grav Para Term Preterm Abortions TAB SAB Ect Mult Living   2 2 2       2       Review of Systems  Constitutional: Negative for activity change.  HENT: Negative for neck pain.   Musculoskeletal: Positive for arthralgias. Negative for back pain and joint swelling.  Skin: Negative for wound.  Neurological: Positive for weakness. Negative for numbness.    Allergies  Review of patient's allergies indicates no known allergies.  Home Medications   Current Outpatient Rx  Name  Route  Sig  Dispense  Refill  . ACETAMINOPHEN 500 MG PO TABS   Oral   Take 1,000 mg by mouth every 6 (six) hours as needed. Pain         . DOCUSATE SODIUM 100 MG PO CAPS   Oral   Take 100 mg  by mouth 2 (two) times daily.         [name] ESOMEPRAZOLE MAGNESIUM 40 MG PO CPDR   Oral   Take 40 mg by mouth daily. AM         . IRON-VIT C-VIT B12-FOLIC ACID 100-250-0.025-1 MG PO TABS   Oral   Take 1 tablet by mouth daily.         [name] POLYETHYLENE GLYCOL 3350 PO PACK   Oral   Take 17 g by mouth every other day.          [name] ZOLPIDEM TARTRATE ER 12.5 MG PO TBCR   Oral   Take 12.5 mg by mouth at bedtime as needed. Sleep         . HYDROCODONE-ACETAMINOPHEN 5-325 MG PO TABS      Take 1-2 tablets every 6 hours as needed for severe pain   12 tablet   0   . MELOXICAM 7.5 MG PO TABS   Oral   Take 1 tablet (7.5 mg total) by mouth daily.   14 tablet   0      BP 131/79  Pulse 82  Temp 99.3 F (37.4 C) (Oral)  Resp 20  SpO2 100%  LMP 12/27/2003  Physical Exam  Nursing note and vitals reviewed. Constitutional: She appears well-developed and well-nourished.  HENT:  Head: Normocephalic and atraumatic.  Eyes: Pupils are equal, round, and reactive to light.  Neck: Normal range of motion. Neck supple.  Cardiovascular: Exam reveals no decreased pulses.   Musculoskeletal: She exhibits tenderness. She exhibits no edema.       Right shoulder: Normal.       Right elbow: She exhibits normal range of motion and no swelling. tenderness found. No radial head, no medial epicondyle, no lateral epicondyle and no olecranon process tenderness noted.       Right wrist: She exhibits tenderness and bony tenderness. She exhibits normal range of motion and no swelling.       Right ankle: tenderness. No lateral malleolus, no medial malleolus and no proximal fibula tenderness found. Achilles tendon exhibits no pain and normal Thompson's test results.       Right forearm: She exhibits no tenderness, no bony tenderness and no swelling.       Right lower leg: She exhibits no tenderness and no bony tenderness.       Right foot: She exhibits no tenderness, no bony tenderness and normal capillary refill.       Feet:  Neurological: She is alert. No sensory deficit.       Motor, sensation, and vascular distal to the injury is fully intact.   Skin: Skin is warm and dry.  Psychiatric: She has a normal mood and affect.    ED Course  Procedures (including critical care time)  Labs Reviewed - No data to display No results found.   1. Arm pain   2. Ankle pain    8:49 AM Patient seen and examined.   Vital signs reviewed and are as follows: Filed Vitals:   03/13/12 0805  BP: 131/79  Pulse: 82  Temp: 99.3 F (37.4 C)  Resp: 20   Will start on prescription NSAID, pain medication.   Ortho referral given and patient urged to follow-up.   Counseled on [facility]  protocol.   Patient verbalizes understanding and agrees with plan.   MDM  Wrist/elbow/forearm pain: likely 2/2 overuse, no apparent nerve distribution to suggest carpal/cubital tunnel, vascularly intact. Ortho f/u given intractable symptoms with  usual treatment.   Ankle pain: chronic, no new injury. Continue conservative management. Good pulses and sensation. Pt ambulatory.         [place], [place] 03/13/12 [phone]

## 2012-03-13 NOTE — ED Notes (Signed)
Pt presenting to ed with c/o right hand, right arm and right wrist pain x 1 month. Pt states she has history of carpal tunnel. Pt states she was told to wear her brace and to take ibuprofen but it's not helping. Pt states she thinks pain is from her job and the repetitive movements. Pt also with c/o right ankle pain and swelling x 2-3 months

## 2012-08-19 ENCOUNTER — Emergency Department (HOSPITAL_COMMUNITY): Payer: Medicaid Other

## 2012-08-19 ENCOUNTER — Emergency Department (HOSPITAL_COMMUNITY)
Admission: EM | Admit: 2012-08-19 | Discharge: 2012-08-20 | Disposition: A | Discharge status: Home / Self Care | Payer: Medicaid Other | Source: Home / Self Care | Attending: Emergency Medicine | Admitting: Emergency Medicine

## 2012-08-19 ENCOUNTER — Encounter (HOSPITAL_COMMUNITY): Payer: Self-pay | Admitting: *Deleted

## 2012-08-19 DIAGNOSIS — J302 Other seasonal allergic rhinitis: Secondary | ICD-10-CM

## 2012-08-19 DIAGNOSIS — J329 Chronic sinusitis, unspecified: Secondary | ICD-10-CM

## 2012-08-19 MED ORDER — OXYCODONE-ACETAMINOPHEN 5-325 MG PO TABS
1.0000 | ORAL_TABLET | Freq: Once | ORAL | Status: AC
  Administered 2012-08-19: 1 via ORAL
  Filled 2012-08-19: qty 1

## 2012-08-19 MED ORDER — DEXAMETHASONE SODIUM PHOSPHATE 10 MG/ML IJ SOLN: 10.0000 mg | Freq: Once | INTRAMUSCULAR | Status: DC

## 2012-08-19 MED ORDER — DEXAMETHASONE SODIUM PHOSPHATE 10 MG/ML IJ SOLN
10.0000 mg | Freq: Once | INTRAMUSCULAR | Status: AC
  Administered 2012-08-19: 10 mg via INTRAMUSCULAR
  Filled 2012-08-19: qty 1

## 2012-08-19 NOTE — ED Provider Notes (Signed)
History    This chart was scribed for non-physician practitioner working with Hurman Horn, MD by Smitty Pluck, ED scribe. This patient was seen in room WTR6/WTR6 and the patient's care was started at 10:42 PM.   CSN: [id number]  Arrival date & time 08/19/12  2219     Chief Complaint  Patient presents with  . Nasal Congestion    The history is provided by the patient. No language interpreter was used.   [name] is a 37 y.o. female who presents to the Emergency Department complaining of a constant, now worsening pain and tenderness in the face. Pt has sinus pressure and congestion and believes a sinus infection is responsible for her symptoms. Pt has associated headache, cough and nausea. Pt states that headaches have been occuring for 2 weeks and other symptoms have followed the headache. Pt has been on an antibiotic for 3 days and has been using Afrin, and Claritin daily for relief. Pt has been taking Ibuprofen every 4 hours for pain without relief. Pt denies fever, chills, vomiting, diarrhea, weakness, SOB and any other pain. Pt denies smoking cigarettes and drinking alcohol.    Past Medical History  Diagnosis Date  . GERD (gastroesophageal reflux disease)   . Anemia   . Constipation   . Hx of endometriosis   . Macromastia 07/2011    Past Surgical History  Procedure Laterality Date  . Salpingectomy  12/12/2003    right; with TAH  . Cesarean section  12/29/2000; 1994  . Bladder neck reconstruction  01/14/2011    Procedure: BLADDER NECK REPAIR;  Surgeon: Reva Bores, MD;  Location: [facility];  Service: Gynecology;  Laterality: N/A;  Laparoscopic Repair of Incidental Cystotomy  . Ankle arthroscopy  12/29/2007    right; with extensive debridement  . Abdominal hysterectomy  12/12/2003  . Tubal ligation  12/29/2000  . Dilation and curettage of uterus  07/08/2003    open laparoscopy  . Repair peroneal tendons ankle  01/03/2010    repair right subluxing peroneal tendons  .  Tonsillectomy  as a child  . Adenoidectomy  05/2011  . Laparoscopic salpingoopherectomy  01/14/2011    left  . Right oophorectomy      with lysis of adhesions  . Breast reduction surgery  08/05/2011    Procedure: MAMMARY REDUCTION  (BREAST);  Surgeon: Louisa Second, MD;  [facility];  Service: Plastics;  Laterality: Bilateral;  bilateral    Family History  Problem Relation Age of Onset  . Diabetes Mother   . Hypertension Mother   . Thyroid disease Sister     History  Substance Use Topics  . Smoking status: Never Smoker   . Smokeless tobacco: Never Used  . Alcohol Use: No    OB History   Grav Para Term Preterm Abortions TAB SAB Ect Mult Living   2 2 2       2       Review of Systems  Constitutional: Negative for fever and chills.  HENT: Positive for congestion.   Respiratory: Positive for cough. Negative for shortness of breath.   Gastrointestinal: Positive for nausea. Negative for vomiting.  Neurological: Positive for headaches. Negative for weakness.    Allergies  Review of patient's allergies indicates no known allergies.  Home Medications   Current Outpatient Rx  Name  Route  Sig  Dispense  Refill  . acetaminophen (TYLENOL) 500 MG tablet   Oral   Take 1,000 mg by mouth every 6 (  six) hours as needed. Pain         . docusate sodium (COLACE) 100 MG capsule   Oral   Take 100 mg by mouth 2 (two) times daily.         [name] esomeprazole (NEXIUM) 40 MG capsule   Oral   Take 40 mg by mouth daily. AM         . HYDROcodone-acetaminophen (NORCO/VICODIN) 5-325 MG per tablet      Take 1-2 tablets every 6 hours as needed for severe pain   12 tablet   0   . Iron-Vit C-Vit B12-Folic Acid (IRON 100 PLUS) 409-811-9.147-8 MG TABS   Oral   Take 1 tablet by mouth daily.         . meloxicam (MOBIC) 7.5 MG tablet   Oral   Take 1 tablet (7.5 mg total) by mouth daily.   14 tablet   0   . polyethylene glycol (MIRALAX / GLYCOLAX) packet    Oral   Take 17 g by mouth every other day.          . zolpidem (AMBIEN CR) 12.5 MG CR tablet   Oral   Take 12.5 mg by mouth at bedtime as needed. Sleep           Triage Vitals: BP 129/82  Pulse 97  Temp(Src) 98.8 F (37.1 C)  Resp 20  SpO2 97%  LMP 12/27/2003  Physical Exam  Nursing note and vitals reviewed. Constitutional: She is oriented to person, place, and time. She appears well-developed and well-nourished. No distress.  HENT:  Head: Normocephalic and atraumatic.  Mouth/Throat: Oropharynx is clear and moist.  Ear canals are red bilaterally Erythema in TM's bilaterally Maxillary sinus is tender. Clear congestion in nose.  Eyes: EOM are normal.  Conjunctiva injected.  Neck: Neck supple. No tracheal deviation present.  Cardiovascular: Normal rate, regular rhythm and normal heart sounds.   No murmur heard. Pulmonary/Chest: Effort normal and breath sounds normal. No respiratory distress. She has no wheezes. She has no rales.  Musculoskeletal: Normal range of motion.  Neurological: She is alert and oriented to person, place, and time.  Skin: Skin is warm and dry.  Psychiatric: She has a normal mood and affect. Her behavior is normal.    ED Course  Procedures (including critical care time)  DIAGNOSTIC STUDIES: Oxygen Saturation is 97% on room air, adequate by my interpretation.    COORDINATION OF CARE: 10:50 PM Discussed ED treatment with pt and pt agrees.     Ct Maxillofacial Wo Cm  08/20/2012  *RADIOLOGY REPORT*  Clinical Data: Right maxillary pain.  Nasal congestion.  CT MAXILLOFACIAL WITHOUT CONTRAST  Technique:  Multidetector CT imaging of the maxillofacial structures was performed. Multiplanar CT image reconstructions were also generated.  Comparison: CT scan of the head dated 01/13/2011  Findings: There is a tiny air fluid level with slight mucosal thickening in the left maxillary sinus and there is minimal mucosal thickening in the ethmoid air cells.   Paranasal sinuses are otherwise clear.  No osseous abnormality.  No visible abnormality of the teeth in the mandible or maxilla.  No soft tissue abnormality.  IMPRESSION: No significant abnormality.  Minimal chronic mucosal changes in the ethmoid and left maxillary sinuses. Right maxillary sinus is clear.   Original Report Authenticated By: Francene Boyers, M.D.       Labs Reviewed - No data to display No results found.   1. Sinusitis   2. Seasonal allergies  MDM  PT with persistent facial and sinus pain, congestion, watery and itchy eyes despite being on omnicef, claritin, flonase, afrin. Taking ibuprofen and tylenol every 4 hrs for pain. Pt in the room crying on arrival. CT sinuses obtained to r/o sinus impaction and it is negative except for minaml chronic mucosal changes. Given shot of decadron 10mg  IM for inflammation. Percocet for pain. Will d/c home,. Advised to continue current medications, stop afrin, start sudafed, saline. Will give norco to take at home.   Filed Vitals:   08/19/12 2225 08/20/12 0038  BP: 129/82 128/80  Pulse: 97 77  Temp: 98.8 F (37.1 C) 98.3 F (36.8 C)  TempSrc:  Oral  Resp: 20 18  SpO2: 97%      I personally performed the services described in this documentation, which was scribed in my presence. The recorded information has been reviewed and is accurate.    Lottie Mussel, PA-C 08/20/12 0111

## 2012-08-19 NOTE — ED Notes (Addendum)
Few weeks of headaches, saw doctor for headaches and congestion 2 days ago, told she has a sinus infection, started on antibiotics, feels she is not getting better. Pain and congestion worse.

## 2012-08-20 ENCOUNTER — Emergency Department (HOSPITAL_COMMUNITY): Payer: Medicaid Other

## 2012-08-20 ENCOUNTER — Emergency Department (HOSPITAL_COMMUNITY)
Admission: EM | Admit: 2012-08-20 | Discharge: 2012-08-21 | Disposition: A | Discharge status: Home / Self Care | Payer: Medicaid Other | Source: Home / Self Care | Attending: Emergency Medicine | Admitting: Emergency Medicine

## 2012-08-20 ENCOUNTER — Encounter (HOSPITAL_COMMUNITY): Payer: Self-pay | Admitting: Emergency Medicine

## 2012-08-20 DIAGNOSIS — E869 Volume depletion, unspecified: Secondary | ICD-10-CM

## 2012-08-20 DIAGNOSIS — J40 Bronchitis, not specified as acute or chronic: Secondary | ICD-10-CM

## 2012-08-20 MED ORDER — SODIUM CHLORIDE 0.9 % IV BOLUS (SEPSIS)
1000.0000 mL | Freq: Once | INTRAVENOUS | Status: AC
  Administered 2012-08-21: 1000 mL via INTRAVENOUS

## 2012-08-20 MED ORDER — PSEUDOEPHEDRINE HCL 60 MG PO TABS: 60.0000 mg | ORAL_TABLET | ORAL | Status: DC | PRN

## 2012-08-20 MED ORDER — ALBUTEROL SULFATE (5 MG/ML) 0.5% IN NEBU
5.0000 mg | INHALATION_SOLUTION | Freq: Once | RESPIRATORY_TRACT | Status: AC
  Administered 2012-08-20: 5 mg via RESPIRATORY_TRACT
  Filled 2012-08-20: qty 1

## 2012-08-20 MED ORDER — HYDROCODONE-ACETAMINOPHEN 5-325 MG PO TABS: 1.0000 | ORAL_TABLET | Freq: Four times a day (QID) | ORAL | Status: DC | PRN

## 2012-08-20 MED ORDER — IBUPROFEN 800 MG PO TABS
800.0000 mg | ORAL_TABLET | Freq: Once | ORAL | Status: AC
  Administered 2012-08-21: 800 mg via ORAL
  Filled 2012-08-20: qty 1

## 2012-08-20 NOTE — ED Notes (Signed)
Pt is c/o cough, congestion, dizziness, and headache  Pt states she has been sick for about a week or so  Pt states she was seen here yesterday for same  Pt states the reason she came back in today is because she feels very dizzy when she walks like she is going to pass out

## 2012-08-21 ENCOUNTER — Encounter (HOSPITAL_COMMUNITY): Payer: Self-pay | Admitting: Emergency Medicine

## 2012-08-21 ENCOUNTER — Emergency Department (HOSPITAL_COMMUNITY): Payer: Medicaid Other

## 2012-08-21 ENCOUNTER — Inpatient Hospital Stay (HOSPITAL_COMMUNITY)
Admission: EM | Admit: 2012-08-21 | Discharge: 2012-08-23 | DRG: 195 | Disposition: A | Discharge status: Home / Self Care | Payer: Medicaid Other | Attending: Internal Medicine | Admitting: Internal Medicine

## 2012-08-21 DIAGNOSIS — Z8249 Family history of ischemic heart disease and other diseases of the circulatory system: Secondary | ICD-10-CM

## 2012-08-21 DIAGNOSIS — E669 Obesity, unspecified: Secondary | ICD-10-CM | POA: Diagnosis present

## 2012-08-21 DIAGNOSIS — Z8742 Personal history of other diseases of the female genital tract: Secondary | ICD-10-CM

## 2012-08-21 DIAGNOSIS — Z411 Encounter for cosmetic surgery: Secondary | ICD-10-CM

## 2012-08-21 DIAGNOSIS — B349 Viral infection, unspecified: Secondary | ICD-10-CM | POA: Diagnosis present

## 2012-08-21 DIAGNOSIS — J189 Pneumonia, unspecified organism: Principal | ICD-10-CM | POA: Diagnosis present

## 2012-08-21 DIAGNOSIS — Z833 Family history of diabetes mellitus: Secondary | ICD-10-CM

## 2012-08-21 DIAGNOSIS — J209 Acute bronchitis, unspecified: Secondary | ICD-10-CM | POA: Diagnosis present

## 2012-08-21 DIAGNOSIS — B9789 Other viral agents as the cause of diseases classified elsewhere: Secondary | ICD-10-CM | POA: Diagnosis present

## 2012-08-21 DIAGNOSIS — K219 Gastro-esophageal reflux disease without esophagitis: Secondary | ICD-10-CM | POA: Diagnosis present

## 2012-08-21 DIAGNOSIS — R112 Nausea with vomiting, unspecified: Secondary | ICD-10-CM | POA: Diagnosis present

## 2012-08-21 DIAGNOSIS — J449 Chronic obstructive pulmonary disease, unspecified: Secondary | ICD-10-CM | POA: Diagnosis present

## 2012-08-21 DIAGNOSIS — J4489 Other specified chronic obstructive pulmonary disease: Secondary | ICD-10-CM | POA: Diagnosis present

## 2012-08-21 DIAGNOSIS — Z90710 Acquired absence of both cervix and uterus: Secondary | ICD-10-CM

## 2012-08-21 LAB — CBC
HCT: 36.6 % (ref 36.0–46.0)
Hemoglobin: 12.2 g/dL (ref 12.0–15.0)
MCH: 24.8 pg — ABNORMAL LOW (ref 26.0–34.0)
MCHC: 33.3 g/dL (ref 30.0–36.0)
MCV: 74.4 fL — ABNORMAL LOW (ref 78.0–100.0)
Platelets: 246 10*3/uL (ref 150–400)
RBC: 4.92 MIL/uL (ref 3.87–5.11)
RDW: 13 % (ref 11.5–15.5)
WBC: 8.8 10*3/uL (ref 4.0–10.5)

## 2012-08-21 LAB — COMPREHENSIVE METABOLIC PANEL
ALT: 16 U/L (ref 0–35)
AST: 22 U/L (ref 0–37)
Albumin: 3.6 g/dL (ref 3.5–5.2)
Alkaline Phosphatase: 68 U/L (ref 39–117)
BUN: 5 mg/dL — ABNORMAL LOW (ref 6–23)
CO2: 31 mEq/L (ref 19–32)
Calcium: 9.1 mg/dL (ref 8.4–10.5)
Chloride: 101 mEq/L (ref 96–112)
Creatinine, Ser: 0.79 mg/dL (ref 0.50–1.10)
GFR calc Af Amer: >90 mL/min (ref >90)
GFR calc non Af Amer: >90 mL/min (ref >90)
Glucose, Bld: 79 mg/dL (ref 70–99)
Potassium: 3.8 mEq/L (ref 3.5–5.1)
Sodium: 138 mEq/L (ref 135–145)
Total Bilirubin: 0.2 mg/dL — ABNORMAL LOW (ref 0.3–1.2)
Total Protein: 7.7 g/dL (ref 6.0–8.3)

## 2012-08-21 LAB — BASIC METABOLIC PANEL
BUN: 5 mg/dL — ABNORMAL LOW (ref 6–23)
CO2: 26 mEq/L (ref 19–32)
Calcium: 9.2 mg/dL (ref 8.4–10.5)
Chloride: 97 mEq/L (ref 96–112)
Creatinine, Ser: 0.71 mg/dL (ref 0.50–1.10)
GFR calc Af Amer: >90 mL/min (ref >90)
GFR calc non Af Amer: >90 mL/min (ref >90)
Glucose, Bld: 81 mg/dL (ref 70–99)
Potassium: 3.5 mEq/L (ref 3.5–5.1)
Sodium: 135 mEq/L (ref 135–145)

## 2012-08-21 LAB — CBC WITH DIFFERENTIAL/PLATELET
Basophils Absolute: 0 10*3/uL (ref 0.0–0.1)
Basophils Relative: 0 % (ref 0–1)
Eosinophils Absolute: 0 10*3/uL (ref 0.0–0.7)
Eosinophils Relative: 0 % (ref 0–5)
HCT: 34.6 % — ABNORMAL LOW (ref 36.0–46.0)
Hemoglobin: 11.6 g/dL — ABNORMAL LOW (ref 12.0–15.0)
Lymphocytes Relative: 18 % (ref 12–46)
Lymphs Abs: 2.5 10*3/uL (ref 0.7–4.0)
MCH: 24.1 pg — ABNORMAL LOW (ref 26.0–34.0)
MCHC: 33.5 g/dL (ref 30.0–36.0)
MCV: 71.9 fL — ABNORMAL LOW (ref 78.0–100.0)
Monocytes Absolute: 0.5 10*3/uL (ref 0.1–1.0)
Monocytes Relative: 4 % (ref 3–12)
Neutro Abs: 10.7 10*3/uL — ABNORMAL HIGH (ref 1.7–7.7)
Neutrophils Relative %: 78 % — ABNORMAL HIGH (ref 43–77)
Platelets: 233 10*3/uL (ref 150–400)
RBC: 4.81 MIL/uL (ref 3.87–5.11)
RDW: 13.2 % (ref 11.5–15.5)
WBC: 13.7 10*3/uL — ABNORMAL HIGH (ref 4.0–10.5)

## 2012-08-21 MED ORDER — SODIUM CHLORIDE 0.9 % IV BOLUS (SEPSIS)
1000.0000 mL | Freq: Once | INTRAVENOUS | Status: AC
  Administered 2012-08-21: 1000 mL via INTRAVENOUS

## 2012-08-21 MED ORDER — DEXTROSE 5 % IV SOLN
1.0000 g | Freq: Once | INTRAVENOUS | Status: AC
  Administered 2012-08-21: 1 g via INTRAVENOUS
  Filled 2012-08-21: qty 10

## 2012-08-21 MED ORDER — AZITHROMYCIN 250 MG PO TABS
500.0000 mg | ORAL_TABLET | Freq: Once | ORAL | Status: AC
  Administered 2012-08-21: 500 mg via ORAL
  Filled 2012-08-21: qty 2

## 2012-08-21 MED ORDER — ALBUTEROL SULFATE HFA 108 (90 BASE) MCG/ACT IN AERS: 2.0000 | INHALATION_SPRAY | RESPIRATORY_TRACT | Status: DC | PRN

## 2012-08-21 MED ORDER — ALBUTEROL SULFATE (5 MG/ML) 0.5% IN NEBU
5.0000 mg | INHALATION_SOLUTION | Freq: Once | RESPIRATORY_TRACT | Status: AC
  Administered 2012-08-21: 5 mg via RESPIRATORY_TRACT
  Filled 2012-08-21: qty 1

## 2012-08-21 NOTE — ED Provider Notes (Signed)
History     CSN: [id number]  Arrival date & time 08/21/12  1846   First MD Initiated Contact with Patient 08/21/12 2044      Chief Complaint  Patient presents with  . URI    (Consider location/radiation/quality/duration/timing/severity/associated sxs/prior treatment) HPI Comments: Patient is a 37 y/o F with PMHx of GERD, anemia, endometriosis, macromastia presenting to the ED with cough, congestion, shortness of breathe, chest pain, difficulty breathing, headache, throat pain x 5 days. Patient described chest pain as more of a chest tightness, that she feels this constant tightness and cannot seem to catch her breathe, pain is predominantly on the left side with radiation to the left shoulder. Patient reported that her throat is dry and she had pain when swallowing. Stated that her cough in mainly productive with yellow sputum present, along with congestion that when she blows is a yellowish color. Patient reported that she has facial tenderness as well, mainly described as facial pressure that has been going on for the past couple of days. Patient has been seen by PCP was started on ceftin on Monday, reported that she has not taken her normal dose that she takes everyday at 8:30am - did not take dose today. Patient has been seen in the ED on 08/19/2012 and 08/20/2012 regarding the same complaints, has been discharged with an inhaler that patient has been using - stated that she just has not been getting any better. Patient reported that she is having bodyaches, all over her body. Associated symptoms are abdominal discomfort described as a burning sensation. Denied visual distortions, fever, vomiting, diarrhea, constipation, urinary symptoms, blood in stools, black tarry stools, calf pain, leg swelling, blurry vision, ear pain.  This is patient's third time in the ED regarding the same complaints.     The history is provided by the patient. No language interpreter was used.    Past Medical  History  Diagnosis Date  . GERD (gastroesophageal reflux disease)   . Anemia   . Constipation   . Hx of endometriosis   . Macromastia 07/2011    Past Surgical History  Procedure Laterality Date  . Salpingectomy  12/12/2003    right; with TAH  . Cesarean section  12/29/2000; 1994  . Bladder neck reconstruction  01/14/2011    Procedure: BLADDER NECK REPAIR;  Surgeon: Reva Bores, MD;  Location: [facility];  Service: Gynecology;  Laterality: N/A;  Laparoscopic Repair of Incidental Cystotomy  . Ankle arthroscopy  12/29/2007    right; with extensive debridement  . Abdominal hysterectomy  12/12/2003  . Tubal ligation  12/29/2000  . Dilation and curettage of uterus  07/08/2003    open laparoscopy  . Repair peroneal tendons ankle  01/03/2010    repair right subluxing peroneal tendons  . Tonsillectomy  as a child  . Adenoidectomy  05/2011  . Laparoscopic salpingoopherectomy  01/14/2011    left  . Right oophorectomy      with lysis of adhesions  . Breast reduction surgery  08/05/2011    Procedure: MAMMARY REDUCTION  (BREAST);  Surgeon: Louisa Second, MD;  Location: [facility];  Service: Plastics;  Laterality: Bilateral;  bilateral    Family History  Problem Relation Age of Onset  . Diabetes Mother   . Hypertension Mother   . Thyroid disease Sister     History  Substance Use Topics  . Smoking status: Never Smoker   . Smokeless tobacco: Never Used  . Alcohol Use: No    OB  History   Grav Para Term Preterm Abortions TAB SAB Ect Mult Living   2 2 2       2       Review of Systems  Constitutional: Positive for chills. Negative for fever.  HENT: Positive for congestion, sore throat, rhinorrhea and neck pain. Negative for hearing loss, trouble swallowing, tinnitus and ear discharge.   Eyes: Negative for photophobia, pain and visual disturbance.  Respiratory: Positive for cough and shortness of breath. Negative for chest tightness.   Cardiovascular: Positive for chest pain.   Gastrointestinal: Positive for abdominal pain. Negative for nausea, vomiting, diarrhea and constipation.  Genitourinary: Negative for dysuria, hematuria, decreased urine volume and difficulty urinating.  Musculoskeletal: Positive for myalgias.       Bodyaches   Skin: Negative for rash.  All other systems reviewed and are negative.    Allergies  Review of patient's allergies indicates no known allergies.  Home Medications   No current outpatient prescriptions on file.  BP 109/72  Pulse 76  Temp(Src) 99 F (37.2 C) (Oral)  Resp 22  SpO2 99%  LMP 12/27/2003  Physical Exam  Nursing note and vitals reviewed. Constitutional: She is oriented to person, place, and time. She appears well-developed and well-nourished. No distress.  Patient appeared fatigued  HENT:  Head: Normocephalic and atraumatic.  Mouth/Throat: Oropharynx is clear and moist. No oropharyngeal exudate.  Uvula midline. Mild erythema to posterior pharynx  Eyes: Conjunctivae and EOM are normal. Pupils are equal, round, and reactive to light. Right eye exhibits no discharge. Left eye exhibits no discharge. No scleral icterus.  Neck: Normal range of motion. Neck supple. No tracheal deviation present. No thyromegaly present.  Negative lymphadenopathy  Cardiovascular: Normal rate, regular rhythm, normal heart sounds and intact distal pulses.  Exam reveals no friction rub.   No murmur heard. Radial pulses 2+ bilaterally  Pulmonary/Chest: Effort normal and breath sounds normal. No respiratory distress. She has no wheezes. She has no rales. She exhibits tenderness.  Tenderness to left and right chest wall upon palpation  Abdominal: Soft. Bowel sounds are normal. She exhibits no distension and no mass. There is no tenderness. There is no rebound and no guarding.  Lymphadenopathy:    She has no cervical adenopathy.  Neurological: She is alert and oriented to person, place, and time. No cranial nerve deficit. She exhibits  normal muscle tone. Coordination normal.  Skin: Skin is warm and dry. No rash noted. She is not diaphoretic. No erythema.  Psychiatric: She has a normal mood and affect. Her behavior is normal. Thought content normal.    ED Course  Procedures (including critical care time)  Labs Reviewed  CBC WITH DIFFERENTIAL - Abnormal; Notable for the following:    WBC 13.7 (*)    Hemoglobin 11.6 (*)    HCT 34.6 (*)    MCV 71.9 (*)    MCH 24.1 (*)    Neutrophils Relative 78 (*)    Neutro Abs 10.7 (*)    All other components within normal limits  COMPREHENSIVE METABOLIC PANEL - Abnormal; Notable for the following:    BUN 5 (*)    Total Bilirubin 0.2 (*)    All other components within normal limits  INFLUENZA PANEL BY PCR   Dg Chest 2 View  08/21/2012  *RADIOLOGY REPORT*  Clinical Data: Upper respiratory infection, history GERD  CHEST - 2 VIEW  Comparison: 08/20/2012  Findings: Normal heart size, mediastinal contours, and pulmonary vascularity. Right lower lobe infiltrate is atelectasis. Question bilateral  nipple shadows. Central peribronchial thickening. Remaining lungs clear. No definite pleural effusion or pneumothorax.  IMPRESSION: Right lower lobe infiltrate versus atelectasis. Mild bronchitic changes.   Original Report Authenticated By: Ulyses Southward, M.D.    Dg Chest 2 View  08/20/2012  *RADIOLOGY REPORT*  Clinical Data: Shortness of breath.  CHEST - 2 VIEW  Comparison: Chest radiograph performed 01/13/2011  Findings: The lungs are well-aerated.  Mild chronic peribronchial thickening is noted.  There is no evidence of focal opacification, pleural effusion or pneumothorax.  The heart is normal in size; the mediastinal contour is within normal limits.  No acute osseous abnormalities are seen.  Clips are noted within the right upper quadrant, reflecting prior cholecystectomy.  IMPRESSION: No acute cardiopulmonary process seen.  Mild chronic peribronchial thickening noted.   Original Report Authenticated  By: Tonia Ghent, M.D.     Date: 08/21/2012  Rate: 93  Rhythm: normal sinus rhythm  QRS Axis: normal  Intervals: normal  ST/T Wave abnormalities: normal  Conduction Disutrbances:none  Narrative Interpretation:   Old EKG Reviewed: unchanged    1. CAP (community acquired pneumonia)   2. Nausea and vomiting in adult   3. Obesity, unspecified       MDM  Patient is afebrile, normotensive, non-tachycardic, alert and oriented. Presenting to the ED with similar complaints that she has been having since Monday - has been seen by PCP on Monday, seen in the ED on 08/19/2012 and 08/20/2012 for same complaints. Patient has been using ceftin (from PCP) and inhaler that she was discharged with, with minimal relief from symptoms. I personally evaluated and examined the patient. Patient upset upon exam. CMP negative findings. CBC elevated WBC (13.7). EKG negative findings for NSTEMI/STEMI r/o MI. Chest xray: right lower lode infiltrate noted. Discussed case with Dr. Anitra Lauth - beginnings of pneumonia suspected. Albuterol treatment given. IV saline lock placed. NS IV fluids given. Ceftriaxone and azithromycin IV given. Dr. Anitra Lauth spoke with Internal Medicine regarding patient, patient to be admitted.          Raymon Mutton, PA-C 08/22/12 [phone]

## 2012-08-21 NOTE — ED Notes (Signed)
Attempted PIV start x2 no success, second RN to assess pt.

## 2012-08-21 NOTE — ED Provider Notes (Signed)
History     CSN: [id number]  Arrival date & time 08/20/12  2225   First MD Initiated Contact with Patient 08/20/12 2303      Chief Complaint  Patient presents with  . Shortness of Breath     The history is provided by the patient.   the patient reports development of nasal congestion cough sore throat and chills without documented fever over the past 5-6 days.  She saw her primary care physician on Monday was seen emergency room yesterday.  She reports that she's feeling slightly dizzy when she walks and she's had decreased oral intake over the past 24 hours.  Nausea without vomiting.  She denies diarrhea.  No melena or hematochezia.  No abdominal pain.  No chest pain.  She continues to have productive cough.  She states she is compliant with the medications that she was prescribed including an antibiotic given to her by her primary care physician.  No urinary symptoms.  No diarrhea.  Symptoms are mild to moderate in severity.  Nothing worsens or improves her symptoms.  No weakness of her arms or leg  Past Medical History  Diagnosis Date  . GERD (gastroesophageal reflux disease)   . Anemia   . Constipation   . Hx of endometriosis   . Macromastia 07/2011    Past Surgical History  Procedure Laterality Date  . Salpingectomy  12/12/2003    right; with TAH  . Cesarean section  12/29/2000; 1994  . Bladder neck reconstruction  01/14/2011    Procedure: BLADDER NECK REPAIR;  Surgeon: Reva Bores, MD;  Location: [facility];  Service: Gynecology;  Laterality: N/A;  Laparoscopic Repair of Incidental Cystotomy  . Ankle arthroscopy  12/29/2007    right; with extensive debridement  . Abdominal hysterectomy  12/12/2003  . Tubal ligation  12/29/2000  . Dilation and curettage of uterus  07/08/2003    open laparoscopy  . Repair peroneal tendons ankle  01/03/2010    repair right subluxing peroneal tendons  . Tonsillectomy  as a child  . Adenoidectomy  05/2011  . Laparoscopic salpingoopherectomy  01/14/2011     left  . Right oophorectomy      with lysis of adhesions  . Breast reduction surgery  08/05/2011    Procedure: MAMMARY REDUCTION  (BREAST);  Surgeon: Louisa Second, MD;  Location: [facility];  Service: Plastics;  Laterality: Bilateral;  bilateral    Family History  Problem Relation Age of Onset  . Diabetes Mother   . Hypertension Mother   . Thyroid disease Sister     History  Substance Use Topics  . Smoking status: Never Smoker   . Smokeless tobacco: Never Used  . Alcohol Use: No    OB History   Grav Para Term Preterm Abortions TAB SAB Ect Mult Living   2 2 2       2       Review of Systems  Respiratory: Positive for shortness of breath.   All other systems reviewed and are negative.    Allergies  Review of patient's allergies indicates no known allergies.  Home Medications   Current Outpatient Rx  Name  Route  Sig  Dispense  Refill  . acetaminophen (TYLENOL) 500 MG tablet   Oral   Take 1,000 mg by mouth every 6 (six) hours as needed for pain. Pain         . cefdinir (OMNICEF) 300 MG capsule   Oral   Take 300 mg by mouth  2 (two) times daily.         [name] docusate sodium (COLACE) 100 MG capsule   Oral   Take 100 mg by mouth 2 (two) times daily.         [name] esomeprazole (NEXIUM) 40 MG capsule   Oral   Take 40 mg by mouth every morning. AM         . fluticasone (FLONASE) 50 MCG/ACT nasal spray   Nasal   Place 2 sprays into the nose every morning.         [name] HYDROcodone-acetaminophen (NORCO) 5-325 MG per tablet   Oral   Take 1 tablet by mouth every 6 (six) hours as needed for pain.   20 tablet   0   . loratadine (CLARITIN) 10 MG tablet   Oral   Take 10 mg by mouth every morning.         . Multiple Vitamin (MULTIVITAMIN WITH MINERALS) TABS   Oral   Take 1 tablet by mouth every other day.         [name] oxymetazoline (AFRIN) 0.05 % nasal spray   Nasal   Place 2 sprays into the nose 2 (two) times daily as needed for congestion.          . polyethylene glycol (MIRALAX / GLYCOLAX) packet   Oral   Take 17 g by mouth daily as needed (constipation).          . pseudoephedrine (SUDAFED) 60 MG tablet   Oral   Take 1 tablet (60 mg total) by mouth every 4 (four) hours as needed for congestion.   30 tablet   0   . zolpidem (AMBIEN CR) 12.5 MG CR tablet   Oral   Take 12.5 mg by mouth at bedtime as needed for sleep. Sleep         . albuterol (PROVENTIL HFA;VENTOLIN HFA) 108 (90 BASE) MCG/ACT inhaler   Inhalation   Inhale 2 puffs into the lungs every 4 (four) hours as needed for wheezing.   1 Inhaler   0     BP 117/74  Pulse 73  Temp(Src) 98.6 F (37 C) (Oral)  Resp 16  SpO2 100%  LMP 12/27/2003  Physical Exam  Nursing note and vitals reviewed. Constitutional: She is oriented to person, place, and time. She appears well-developed and well-nourished. No distress.  HENT:  Head: Normocephalic and atraumatic.  Uvula midline.  Posterior pharynx without erythema or exudates.  Tonsils are normal.  Eyes: EOM are normal.  Neck: Normal range of motion.  Cardiovascular: Normal rate, regular rhythm and normal heart sounds.   Pulmonary/Chest: Effort normal and breath sounds normal. She has no wheezes.  Abdominal: Soft. She exhibits no distension. There is no tenderness.  Musculoskeletal: Normal range of motion.  Neurological: She is alert and oriented to person, place, and time.  Skin: Skin is warm and dry.  Psychiatric: She has a normal mood and affect. Judgment normal.    ED Course  Procedures (including critical care time)  Labs Reviewed  CBC - Abnormal; Notable for the following:    MCV 74.4 (*)    MCH 24.8 (*)    All other components within normal limits  BASIC METABOLIC PANEL - Abnormal; Notable for the following:    BUN 5 (*)    All other components within normal limits   Dg Chest 2 View  08/20/2012  *RADIOLOGY REPORT*  Clinical Data: Shortness of breath.  CHEST - 2 VIEW  Comparison: Chest  radiograph performed 01/13/2011  Findings: The lungs are well-aerated.  Mild chronic peribronchial thickening is noted.  There is no evidence of focal opacification, pleural effusion or pneumothorax.  The heart is normal in size; the mediastinal contour is within normal limits.  No acute osseous abnormalities are seen.  Clips are noted within the right upper quadrant, reflecting prior cholecystectomy.  IMPRESSION: No acute cardiopulmonary process seen.  Mild chronic peribronchial thickening noted.   Original Report Authenticated By: Tonia Ghent, M.D.    Ct Maxillofacial Wo Cm  08/20/2012  *RADIOLOGY REPORT*  Clinical Data: Right maxillary pain.  Nasal congestion.  CT MAXILLOFACIAL WITHOUT CONTRAST  Technique:  Multidetector CT imaging of the maxillofacial structures was performed. Multiplanar CT image reconstructions were also generated.  Comparison: CT scan of the head dated 01/13/2011  Findings: There is a tiny air fluid level with slight mucosal thickening in the left maxillary sinus and there is minimal mucosal thickening in the ethmoid air cells.  Paranasal sinuses are otherwise clear.  No osseous abnormality.  No visible abnormality of the teeth in the mandible or maxilla.  No soft tissue abnormality.  IMPRESSION: No significant abnormality.  Minimal chronic mucosal changes in the ethmoid and left maxillary sinuses. Right maxillary sinus is clear.   Original Report Authenticated By: Francene Boyers, M.D.    I personally reviewed the imaging tests through PACS system I reviewed available ER/hospitalization records through the EMR   1. Bronchitis   2. Volume depletion       MDM  Albuterol inhaler to help with cough.  Seemed to improve the cough.  Her symptoms seem to be upper respiratory in nature with associated bronchitis.  Chest x-ray without pneumonia.  Labs normal.  Possible volume depletion.  Feels better after IV fluids.  Discharge home with PCP followup.        Lyanne Co,  MD 08/21/12 [phone]

## 2012-08-21 NOTE — ED Provider Notes (Signed)
Medical screening examination/treatment/procedure(s) were performed by non-physician practitioner and as supervising physician I was immediately available for consultation/collaboration.   Hurman Horn, MD 08/21/12 2010

## 2012-08-21 NOTE — ED Notes (Signed)
TRANSPORTED TO X-RAY. 

## 2012-08-21 NOTE — ED Notes (Addendum)
Pt st's she has been seen at [facility] for past 2 days.  St's first time she was dx with sinus infection, yesterday she was dx with URI.  Pt st's she was put on antibiotic on Mon but stopped taking it because she thinks it was making her sicker.  Pt c/o feeling short of breath with productive cough. Pt requesting a EKG because her chest is burning.

## 2012-08-22 ENCOUNTER — Encounter (HOSPITAL_COMMUNITY): Payer: Self-pay | Admitting: Internal Medicine

## 2012-08-22 DIAGNOSIS — R112 Nausea with vomiting, unspecified: Secondary | ICD-10-CM | POA: Diagnosis present

## 2012-08-22 DIAGNOSIS — B349 Viral infection, unspecified: Secondary | ICD-10-CM | POA: Diagnosis present

## 2012-08-22 DIAGNOSIS — J189 Pneumonia, unspecified organism: Secondary | ICD-10-CM | POA: Diagnosis present

## 2012-08-22 LAB — INFLUENZA PANEL BY PCR (TYPE A & B)
H1N1 flu by pcr: NOT DETECTED
Influenza A By PCR: NEGATIVE
Influenza B By PCR: NEGATIVE

## 2012-08-22 MED ORDER — ALBUTEROL SULFATE HFA 108 (90 BASE) MCG/ACT IN AERS: 2.0000 | INHALATION_SPRAY | RESPIRATORY_TRACT | Status: DC | PRN

## 2012-08-22 MED ORDER — DEXTROSE 5 % IV SOLN
500.0000 mg | Freq: Every day | INTRAVENOUS | Status: DC
  Administered 2012-08-22: 500 mg via INTRAVENOUS
  Filled 2012-08-22 (×2): qty 500

## 2012-08-22 MED ORDER — DOCUSATE SODIUM 100 MG PO CAPS
100.0000 mg | ORAL_CAPSULE | Freq: Two times a day (BID) | ORAL | Status: DC
  Administered 2012-08-22 – 2012-08-23 (×3): 100 mg via ORAL
  Filled 2012-08-22 (×4): qty 1

## 2012-08-22 MED ORDER — PANTOPRAZOLE SODIUM 40 MG PO TBEC
40.0000 mg | DELAYED_RELEASE_TABLET | Freq: Every day | ORAL | Status: DC
  Administered 2012-08-22 – 2012-08-23 (×2): 40 mg via ORAL
  Filled 2012-08-22 (×2): qty 1

## 2012-08-22 MED ORDER — ZOLPIDEM TARTRATE 5 MG PO TABS: 5.0000 mg | ORAL_TABLET | Freq: Every evening | ORAL | Status: DC | PRN

## 2012-08-22 MED ORDER — POLYETHYLENE GLYCOL 3350 17 G PO PACK
17.0000 g | PACK | Freq: Every day | ORAL | Status: DC | PRN
  Administered 2012-08-23: 17 g via ORAL
  Filled 2012-08-22 (×2): qty 1

## 2012-08-22 MED ORDER — DEXTROSE 5 % IV SOLN
1.0000 g | Freq: Every day | INTRAVENOUS | Status: DC
  Administered 2012-08-22: 1 g via INTRAVENOUS
  Filled 2012-08-22 (×2): qty 10

## 2012-08-22 MED ORDER — ACETAMINOPHEN 325 MG PO TABS
975.0000 mg | ORAL_TABLET | Freq: Four times a day (QID) | ORAL | Status: DC | PRN
  Administered 2012-08-22: 975 mg via ORAL
  Filled 2012-08-22: qty 3

## 2012-08-22 MED ORDER — ONDANSETRON HCL 4 MG/2ML IJ SOLN: 4.0000 mg | Freq: Four times a day (QID) | INTRAMUSCULAR | Status: DC | PRN

## 2012-08-22 MED ORDER — MORPHINE SULFATE 2 MG/ML IJ SOLN
2.0000 mg | INTRAMUSCULAR | Status: DC | PRN
  Administered 2012-08-22 (×2): 2 mg via INTRAVENOUS
  Filled 2012-08-22 (×2): qty 1

## 2012-08-22 MED ORDER — GUAIFENESIN ER 600 MG PO TB12
1200.0000 mg | ORAL_TABLET | Freq: Two times a day (BID) | ORAL | Status: DC
  Administered 2012-08-22 – 2012-08-23 (×3): 1200 mg via ORAL
  Filled 2012-08-22 (×4): qty 2

## 2012-08-22 MED ORDER — ENOXAPARIN SODIUM 40 MG/0.4ML ~~LOC~~ SOLN
40.0000 mg | Freq: Every day | SUBCUTANEOUS | Status: DC
  Administered 2012-08-22 – 2012-08-23 (×2): 40 mg via SUBCUTANEOUS
  Filled 2012-08-22 (×2): qty 0.4

## 2012-08-22 MED ORDER — OSELTAMIVIR PHOSPHATE 75 MG PO CAPS
75.0000 mg | ORAL_CAPSULE | Freq: Two times a day (BID) | ORAL | Status: DC
  Administered 2012-08-22 (×2): 75 mg via ORAL
  Filled 2012-08-22 (×3): qty 1

## 2012-08-22 MED ORDER — TRAMADOL HCL 50 MG PO TABS
50.0000 mg | ORAL_TABLET | Freq: Four times a day (QID) | ORAL | Status: DC | PRN
  Administered 2012-08-22 – 2012-08-23 (×4): 50 mg via ORAL
  Filled 2012-08-22 (×4): qty 1

## 2012-08-22 MED ORDER — ONDANSETRON HCL 4 MG PO TABS
4.0000 mg | ORAL_TABLET | Freq: Four times a day (QID) | ORAL | Status: DC | PRN
  Administered 2012-08-22: 4 mg via ORAL
  Filled 2012-08-22: qty 1

## 2012-08-22 MED ORDER — DOCUSATE SODIUM 100 MG PO CAPS: 100.0000 mg | ORAL_CAPSULE | Freq: Two times a day (BID) | ORAL | Status: DC

## 2012-08-22 MED ORDER — DEXTROSE-NACL 5-0.9 % IV SOLN
INTRAVENOUS | Status: DC
  Administered 2012-08-22 – 2012-08-23 (×3): via INTRAVENOUS

## 2012-08-22 NOTE — Progress Notes (Signed)
Patient is a new admit that was admitted via w/c to unit for SOB. Patient oriented to the unit. Call bell is within reach. Patient placed on droplet and flu pcr obtained. Patient IV located in right A/c and fluids were hung. Patient has no skin issues. Will continue to monitor.   Marcelyn Bruins RN

## 2012-08-22 NOTE — Progress Notes (Signed)
   Patient seen earlier today by my colleague Dr. Conley Rolls.  Patient seen and examined, and data base reviewed.  Respiratory tract symptoms, questionable infiltrates on the x-ray treat for COPD versus acute bronchitis.  She works in a daycare, started on Tamiflu and check flu PCR.  Clint Lipps Pager: [phone] 08/22/2012, 10:30 AM

## 2012-08-22 NOTE — ED Provider Notes (Signed)
Medical screening examination/treatment/procedure(s) were conducted as a shared visit with non-physician practitioner(s) and myself.  I personally evaluated the patient during the encounter Pt who presents for the 3rd time in three day for SOB, cough and URI sx.  Seen yesterday and normal labs with neg CXR.  Given fluids and d/ced home.  Today worsening sx.  CXR today with evidence of RLL PnA and mild tachypenea and pt with worsening nausea and SOB.  Will admit for further care  Gwyneth Sprout, MD 08/22/12 1347

## 2012-08-22 NOTE — H&P (Signed)
[facility] History and Physical  [name] ZOX:[id number] DOB: October 25, 1975    PCP:   Gwynneth Aliment, MD   Chief Complaint: cough and myalgia.  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 She is not a smoker and having otherwise benign PMH.  She did not have her influenza shot. Evaluation included a negative chest xray yesterday, but ? Infiltrate vs atelectasis today.  She has a normal WBC and HB, normal renal fx tests.  She was discharged on omiflox PO and has been taking her meds.  No significant shortness of breath or chest pain.  Rewiew of Systems:  Constitutional: Negative for significant weight loss or weight gain Eyes: Negative for eye pain, redness and discharge, diplopia, visual changes, or flashes of light. ENMT: Negative for ear pain, hoarseness, nasal congestion, sinus pressure and sore throat. No headaches; tinnitus, drooling, or problem swallowing. Cardiovascular: Negative for chest pain, palpitations, diaphoresis, dyspnea and peripheral edema. ; No orthopnea, PND Respiratory: Negative for hemoptysis, wheezing and stridor. No pleuritic chestpain. Gastrointestinal: Negative for nausea, vomiting, diarrhea, constipation, abdominal pain, melena, blood in stool, hematemesis, jaundice and rectal bleeding.    Genitourinary: Negative for frequency, dysuria, incontinence,flank pain and hematuria; Musculoskeletal: Negative for back pain and neck pain. Negative for swelling and trauma.;  Skin: . Negative for pruritus, rash, abrasions, bruising and skin lesion.; ulcerations Neuro: Negative for headache, lightheadedness and neck stiffness. Negative for weakness, altered level of consciousness , altered mental status, extremity weakness, burning feet, involuntary movement, seizure  and syncope.  Psych: negative for anxiety, depression, insomnia, tearfulness, panic attacks, hallucinations, paranoia, suicidal or homicidal ideation    Past Medical History  Diagnosis Date  . GERD (gastroesophageal reflux disease)   . Anemia   . Constipation   . Hx of endometriosis   . Macromastia 07/2011    Past Surgical History  Procedure Laterality Date  . Salpingectomy  12/12/2003    right; with TAH  . Cesarean section  12/29/2000; 1994  . Bladder neck reconstruction  01/14/2011    Procedure: BLADDER NECK REPAIR;  Surgeon: Reva Bores, MD;  Location: [facility];  Service: Gynecology;  Laterality: N/A;  Laparoscopic Repair of Incidental Cystotomy  . Ankle arthroscopy  12/29/2007    right; with extensive debridement  . Abdominal hysterectomy  12/12/2003  . Tubal ligation  12/29/2000  . Dilation and curettage of uterus  07/08/2003    open laparoscopy  . Repair peroneal tendons ankle  01/03/2010    repair right subluxing peroneal tendons  . Tonsillectomy  as a child  . Adenoidectomy  05/2011  . Laparoscopic salpingoopherectomy  01/14/2011    left  . Right oophorectomy      with lysis of adhesions  . Breast reduction surgery  08/05/2011    Procedure: MAMMARY REDUCTION  (BREAST);  Surgeon: Louisa Second, MD;  Location: [facility];  Service: Plastics;  Laterality: Bilateral;  bilateral    Medications:  HOME MEDS: Prior to Admission medications   Medication Sig Start Date End Date Taking? Authorizing Provider  acetaminophen (TYLENOL) 500 MG tablet Take 1,000 mg by mouth every 6 (six) hours as needed for pain. Pain   Yes Historical Provider, MD  albuterol (PROVENTIL HFA;VENTOLIN HFA) 108 (90 BASE) MCG/ACT inhaler Inhale 2 puffs into the lungs every  4 (four) hours as needed for wheezing. 08/21/12  Yes Lyanne Co, MD  cefdinir (OMNICEF) 300 MG capsule Take 300 mg by mouth 2 (two) times daily. 08/16/12 08/26/12 Yes Historical Provider, MD  docusate sodium (COLACE) 100 MG  capsule Take 100 mg by mouth 2 (two) times daily.   Yes Historical Provider, MD  esomeprazole (NEXIUM) 40 MG capsule Take 40 mg by mouth every morning. AM   Yes Historical Provider, MD  fluticasone (FLONASE) 50 MCG/ACT nasal spray Place 2 sprays into the nose every morning.   Yes Historical Provider, MD  HYDROcodone-acetaminophen (NORCO) 5-325 MG per tablet Take 1 tablet by mouth every 6 (six) hours as needed for pain. 08/20/12  Yes Tatyana A Kirichenko, PA-C  loratadine (CLARITIN) 10 MG tablet Take 10 mg by mouth every morning.   Yes Historical Provider, MD  Multiple Vitamin (MULTIVITAMIN WITH MINERALS) TABS Take 1 tablet by mouth every other day.   Yes Historical Provider, MD  oxymetazoline (AFRIN) 0.05 % nasal spray Place 2 sprays into the nose 2 (two) times daily as needed for congestion.   Yes Historical Provider, MD  phentermine 15 MG capsule Take 15 mg by mouth daily.   Yes Historical Provider, MD  polyethylene glycol (MIRALAX / GLYCOLAX) packet Take 17 g by mouth daily as needed (constipation).    Yes Historical Provider, MD  pseudoephedrine (SUDAFED) 60 MG tablet Take 1 tablet (60 mg total) by mouth every 4 (four) hours as needed for congestion. 08/20/12  Yes Tatyana A Kirichenko, PA-C  zolpidem (AMBIEN CR) 12.5 MG CR tablet Take 12.5 mg by mouth at bedtime as needed for sleep. Sleep   Yes Historical Provider, MD     Allergies:  No Known Allergies  Social History:   reports that she has never smoked. She has never used smokeless tobacco. She reports that she does not drink alcohol or use illicit drugs.  Family History: Family History  Problem Relation Age of Onset  . Diabetes Mother   . Hypertension Mother   . Thyroid disease Sister      Physical Exam: Filed Vitals:   08/21/12 1908 08/21/12 2209  BP: 102/67 111/61  Pulse: 96 90  Temp: 98.9 F (37.2 C)   TempSrc: Oral   Resp: 16 25  SpO2: 100% 100%   Blood pressure 111/61, pulse 90, temperature 98.9 F (37.2 C),  temperature source Oral, resp. rate 25, last menstrual period 12/27/2003, SpO2 100.00%.  GEN:  Pleasant  patient lying in the stretcher in no acute distress; cooperative with exam. PSYCH:  alert and oriented x4; does not appear anxious or depressed; affect is appropriate. HEENT: Mucous membranes pink and anicteric; PERRLA; EOM intact; no cervical lymphadenopathy nor thyromegaly or carotid bruit; no JVD; There were no stridor. Neck is very supple. Breasts:: Not examined CHEST WALL: No tenderness CHEST: Normal respiration, crackles on the right chest.  No significant wheezing.   HEART: Regular rate and rhythm.  There are no murmur, rub, or gallops.   BACK: No kyphosis or scoliosis; no CVA tenderness ABDOMEN: soft and non-tender; no masses, no organomegaly, normal abdominal bowel sounds; no pannus; no intertriginous candida. There is no rebound and no distention. Rectal Exam: Not done EXTREMITIES: No bone or joint deformity; age-appropriate arthropathy of the hands and knees; no edema; no ulcerations.  There is no calf tenderness. Genitalia: not examined PULSES: 2+ and symmetric SKIN: Normal hydration no rash or ulceration.  She has myalgia. CNS: Cranial nerves 2-12 grossly intact no focal lateralizing  neurologic deficit.  Speech is fluent; uvula elevated with phonation, facial symmetry and tongue midline. DTR are normal bilaterally, cerebella exam is intact, barbinski is negative and strengths are equaled bilaterally.  No sensory loss.   Labs on Admission:  Basic Metabolic Panel:  Recent Labs Lab 08/21/12 0025 08/21/12 1913  NA 135 138  K 3.5 3.8  CL 97 101  CO2 26 31  GLUCOSE 81 79  BUN 5* 5*  CREATININE 0.71 0.79  CALCIUM 9.2 9.1   Liver Function Tests:  Recent Labs Lab 08/21/12 1913  AST 22  ALT 16  ALKPHOS 68  BILITOT 0.2*  PROT 7.7  ALBUMIN 3.6   No results found for this basename: LIPASE, AMYLASE,  in the last 168 hours No results found for this basename: AMMONIA,   in the last 168 hours CBC:  Recent Labs Lab 08/21/12 0025 08/21/12 1913  WBC 8.8 13.7*  NEUTROABS  --  10.7*  HGB 12.2 11.6*  HCT 36.6 34.6*  MCV 74.4* 71.9*  PLT 246 233   Cardiac Enzymes: No results found for this basename: CKTOTAL, CKMB, CKMBINDEX, TROPONINI,  in the last 168 hours  CBG: No results found for this basename: GLUCAP,  in the last 168 hours   Radiological Exams on Admission: Dg Chest 2 View  08/21/2012  *RADIOLOGY REPORT*  Clinical Data: Upper respiratory infection, history GERD  CHEST - 2 VIEW  Comparison: 08/20/2012  Findings: Normal heart size, mediastinal contours, and pulmonary vascularity. Right lower lobe infiltrate is atelectasis. Question bilateral nipple shadows. Central peribronchial thickening. Remaining lungs clear. No definite pleural effusion or pneumothorax.  IMPRESSION: Right lower lobe infiltrate versus atelectasis. Mild bronchitic changes.   Original Report Authenticated By: Ulyses Southward, M.D.    Dg Chest 2 View  08/20/2012  *RADIOLOGY REPORT*  Clinical Data: Shortness of breath.  CHEST - 2 VIEW  Comparison: Chest radiograph performed 01/13/2011  Findings: The lungs are well-aerated.  Mild chronic peribronchial thickening is noted.  There is no evidence of focal opacification, pleural effusion or pneumothorax.  The heart is normal in size; the mediastinal contour is within normal limits.  No acute osseous abnormalities are seen.  Clips are noted within the right upper quadrant, reflecting prior cholecystectomy.  IMPRESSION: No acute cardiopulmonary process seen.  Mild chronic peribronchial thickening noted.   Original Report Authenticated By: Tonia Ghent, M.D.    Assessment/Plan Present on Admission:  . CAP (community acquired pneumonia) . OBESITY NOS . Nausea and vomiting in adult . Viral syndrome  PLAN: Will admit her for CAP although I think influenza is a definite possibility.  Will start her on Tamiflu, droplet precaution, and obtain PCR  test.  For her PNA, will give Rocephin and Zithromax.    Her nausea and vomiting could be from cough, or from the omniflox.  Her abdominal exam is benign.  I will treat her symptomatically.  She is stable, full code, and will be admitted to [facility] service. Thank you for allowing me to partake in the care of your nice patient.  Other plans as per orders.  Code Status: FULL Unk Lightning, MD. [facility] Pager [phone] 7pm to 7am.  08/22/2012, 12:24 AM

## 2012-08-23 DIAGNOSIS — R112 Nausea with vomiting, unspecified: Secondary | ICD-10-CM

## 2012-08-23 DIAGNOSIS — E669 Obesity, unspecified: Secondary | ICD-10-CM

## 2012-08-23 DIAGNOSIS — J189 Pneumonia, unspecified organism: Principal | ICD-10-CM

## 2012-08-23 LAB — CBC
HCT: 29.2 % — ABNORMAL LOW (ref 36.0–46.0)
Hemoglobin: 9.7 g/dL — ABNORMAL LOW (ref 12.0–15.0)
MCH: 24.4 pg — ABNORMAL LOW (ref 26.0–34.0)
MCHC: 33.2 g/dL (ref 30.0–36.0)
MCV: 73.4 fL — ABNORMAL LOW (ref 78.0–100.0)
Platelets: 215 10*3/uL (ref 150–400)
RBC: 3.98 MIL/uL (ref 3.87–5.11)
RDW: 13.5 % (ref 11.5–15.5)
WBC: 4.7 10*3/uL (ref 4.0–10.5)

## 2012-08-23 MED ORDER — LEVOFLOXACIN 750 MG PO TABS: 750.0000 mg | ORAL_TABLET | Freq: Every day | ORAL | Status: DC

## 2012-08-23 MED ORDER — AZITHROMYCIN 500 MG PO TABS
500.0000 mg | ORAL_TABLET | Freq: Every day | ORAL | Status: DC
  Filled 2012-08-23: qty 1

## 2012-08-23 NOTE — Progress Notes (Signed)
Patient given discharge instructions.  IV removed.  Son was taken to ED while patient was here so patient was wheeled down to ED.

## 2012-08-23 NOTE — Discharge Summary (Signed)
Physician Discharge Summary  [name] ZOX:[id number] DOB: Nov 13, 1975 DOA: 08/21/2012  PCP: Gwynneth Aliment, MD  Admit date: 08/21/2012 Discharge date: 08/23/2012  Time spent: 40 minutes  Recommendations for Outpatient Follow-up:  1. 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 She is not a smoker and having otherwise benign PMH. She did not have her influenza shot. Evaluation included a negative chest xray yesterday, but ? Infiltrate vs atelectasis today. She has a normal WBC and HB, normal renal fx tests. She was discharged on omiflox PO and has been taking her meds. No significant shortness of breath or chest pain.  Hospital Course:   1. Community acquired pneumonia: Patient presents to the hospital complaining about cough, pleuritic chest pain diffuse myalgia and shortness of breath. Patient was discharged home on Omnicef from the emergency department, came in complaining about same symptoms plus fever. Admitted to the hospital for further evaluation. CXR 2 days ago was clear, on the day of discharge showed questionable infiltrates versus atelectasis. Patient is going to be. For community-acquired pneumonia/bronchitis picture. Started on Rocephin and azithromycin. She tested negative for flu. Patient much better today discharge home on Levaquin for 5 more days to complete 7 days of antibiotics, and instructed to  take over-the-counter mucolytics. Followup with primary care physician in one week.  2. Nausea and vomiting: This is resolved likely systemic symptoms of her pneumonia/bronchitis picture.  Procedures:  *None  Consultations:  None  Discharge Exam: Filed Vitals:   08/22/12 0608 08/22/12 1446 08/22/12 2135 08/23/12 0517  BP: 108/71 120/60 100/65 106/67  Pulse: 68 63 66 58  Temp: 98.4 F (36.9 C) 98.1 F (36.7 C) 98.3 F (36.8 C) 97.9 F (36.6 C)  TempSrc: Oral Oral Oral Oral  Resp: 20 18 17 17   Height:      Weight:      SpO2: 100% 98% 98% 98%   General: Alert and awake, oriented x3, not in any acute distress. HEENT: anicteric sclera, pupils reactive to light and accommodation, EOMI CVS: S1-S2 clear, no murmur rubs or gallops Chest: clear to auscultation bilaterally, no wheezing, rales or rhonchi Abdomen: soft nontender, nondistended, normal bowel sounds, no organomegaly Extremities: no cyanosis, clubbing or edema noted bilaterally Neuro: Cranial nerves II-XII intact, no focal neurological deficits  Discharge Instructions     Medication List    STOP taking these medications       cefdinir 300 MG capsule  Commonly known as:  OMNICEF     pseudoephedrine 60 MG tablet  Commonly known as:  SUDAFED      TAKE these medications       acetaminophen 500 MG tablet  Commonly known as:  TYLENOL  Take 1,000 mg by mouth every 6 (six) hours as needed for pain. Pain     albuterol 108 (90 BASE) MCG/ACT inhaler  Commonly known as:  PROVENTIL HFA;VENTOLIN HFA  Inhale 2  puffs into the lungs every 4 (four) hours as needed for wheezing.     docusate sodium 100 MG capsule  Commonly known as:  COLACE  Take 100 mg by mouth 2 (two) times daily.     esomeprazole 40 MG capsule  Commonly known as:  NEXIUM  Take 40 mg by mouth every morning. AM     fluticasone 50 MCG/ACT nasal spray  Commonly known as:  FLONASE  Place 2 sprays into the nose every morning.      HYDROcodone-acetaminophen 5-325 MG per tablet  Commonly known as:  NORCO  Take 1 tablet by mouth every 6 (six) hours as needed for pain.     levofloxacin 750 MG tablet  Commonly known as:  LEVAQUIN  Take 1 tablet (750 mg total) by mouth daily.     loratadine 10 MG tablet  Commonly known as:  CLARITIN  Take 10 mg by mouth every morning.     multivitamin with minerals Tabs  Take 1 tablet by mouth every other day.     oxymetazoline 0.05 % nasal spray  Commonly known as:  AFRIN  Place 2 sprays into the nose 2 (two) times daily as needed for congestion.     phentermine 15 MG capsule  Take 15 mg by mouth daily.     polyethylene glycol packet  Commonly known as:  MIRALAX / GLYCOLAX  Take 17 g by mouth daily as needed (constipation).     zolpidem 12.5 MG CR tablet  Commonly known as:  AMBIEN CR  Take 12.5 mg by mouth at bedtime as needed for sleep. Sleep       Follow-up Information   Follow up with Gwynneth Aliment, MD In 1 week.   Contact information:   [address] [place] [address] [phone]        The results of significant diagnostics from this hospitalization (including imaging, microbiology, ancillary and laboratory) are listed below for reference.    Significant Diagnostic Studies: Dg Chest 2 View  08/21/2012  *RADIOLOGY REPORT*  Clinical Data: Upper respiratory infection, history GERD  CHEST - 2 VIEW  Comparison: 08/20/2012  Findings: Normal heart size, mediastinal contours, and pulmonary vascularity. Right lower lobe infiltrate is atelectasis. Question bilateral nipple shadows. Central peribronchial thickening. Remaining lungs clear. No definite pleural effusion or pneumothorax.  IMPRESSION: Right lower lobe infiltrate versus atelectasis. Mild bronchitic changes.   Original Report Authenticated By: Ulyses Southward, M.D.    Dg Chest 2 View  08/20/2012  *RADIOLOGY REPORT*  Clinical Data: Shortness of breath.  CHEST - 2 VIEW  Comparison: Chest radiograph  performed 01/13/2011  Findings: The lungs are well-aerated.  Mild chronic peribronchial thickening is noted.  There is no evidence of focal opacification, pleural effusion or pneumothorax.  The heart is normal in size; the mediastinal contour is within normal limits.  No acute osseous abnormalities are seen.  Clips are noted within the right upper quadrant, reflecting prior cholecystectomy.  IMPRESSION: No acute cardiopulmonary process seen.  Mild chronic peribronchial thickening noted.   Original Report Authenticated By: Tonia Ghent, M.D.    Ct Maxillofacial Wo Cm  08/20/2012  *RADIOLOGY REPORT*  Clinical Data: Right maxillary pain.  Nasal congestion.  CT MAXILLOFACIAL WITHOUT CONTRAST  Technique:  Multidetector CT imaging of the maxillofacial structures was performed. Multiplanar CT image reconstructions were also generated.  Comparison: CT scan of the head dated 01/13/2011  Findings: There is a tiny air fluid level with slight mucosal thickening in the left maxillary  sinus and there is minimal mucosal thickening in the ethmoid air cells.  Paranasal sinuses are otherwise clear.  No osseous abnormality.  No visible abnormality of the teeth in the mandible or maxilla.  No soft tissue abnormality.  IMPRESSION: No significant abnormality.  Minimal chronic mucosal changes in the ethmoid and left maxillary sinuses. Right maxillary sinus is clear.   Original Report Authenticated By: Francene Boyers, M.D.     Microbiology: No results found for this or any previous visit (from the past 240 hour(s)).   Labs: Basic Metabolic Panel:  Recent Labs Lab 08/21/12 0025 08/21/12 1913  NA 135 138  K 3.5 3.8  CL 97 101  CO2 26 31  GLUCOSE 81 79  BUN 5* 5*  CREATININE 0.71 0.79  CALCIUM 9.2 9.1   Liver Function Tests:  Recent Labs Lab 08/21/12 1913  AST 22  ALT 16  ALKPHOS 68  BILITOT 0.2*  PROT 7.7  ALBUMIN 3.6   No results found for this basename: LIPASE, AMYLASE,  in the last 168 hours No  results found for this basename: AMMONIA,  in the last 168 hours CBC:  Recent Labs Lab 08/21/12 0025 08/21/12 1913 08/23/12 0525  WBC 8.8 13.7* 4.7  NEUTROABS  --  10.7*  --   HGB 12.2 11.6* 9.7*  HCT 36.6 34.6* 29.2*  MCV 74.4* 71.9* 73.4*  PLT 246 233 215   Cardiac Enzymes: No results found for this basename: CKTOTAL, CKMB, CKMBINDEX, TROPONINI,  in the last 168 hours BNP: BNP (last 3 results) No results found for this basename: PROBNP,  in the last 8760 hours CBG: No results found for this basename: GLUCAP,  in the last 168 hours     Signed:  Freman Lapage A  [facility] 08/23/2012, 5:01 PM

## 2012-08-23 NOTE — Progress Notes (Deleted)
Physician Discharge Summary  [name] ZOX:[id number] DOB: Dec 26, 1975 DOA: 08/21/2012  PCP: Gwynneth Aliment, MD  Admit date: 08/21/2012 Discharge date: 08/23/2012  Time spent: 40 minutes  Recommendations for Outpatient Follow-up:  1. 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 She is not a smoker and having otherwise benign PMH. She did not have her influenza shot. Evaluation included a negative chest xray yesterday, but ? Infiltrate vs atelectasis today. She has a normal WBC and HB, normal renal fx tests. She was discharged on omiflox PO and has been taking her meds. No significant shortness of breath or chest pain.  Hospital Course:   1. Community acquired pneumonia: Patient presents to the hospital complaining about cough, pleuritic chest pain diffuse myalgia and shortness of breath. Patient was discharged home on Omnicef from the emergency department, came in complaining about same symptoms plus fever. Admitted to the hospital for further evaluation. CXR 2 days ago was clear, on the day of discharge showed questionable infiltrates versus atelectasis. Patient is going to be. For community-acquired pneumonia/bronchitis picture. Started on Rocephin and azithromycin. She tested negative for flu. Patient much better today discharge home on Levaquin for 5 more days to complete 7 days of antibiotics, and instructed to  take over-the-counter mucolytics. Followup with primary care physician in one week.  2. Nausea and vomiting: This is resolved likely systemic symptoms of her pneumonia/bronchitis picture.  Procedures:  *None  Consultations:  None  Discharge Exam: Filed Vitals:   08/22/12 0608 08/22/12 1446 08/22/12 2135 08/23/12 0517  BP: 108/71 120/60 100/65 106/67  Pulse: 68 63 66 58  Temp: 98.4 F (36.9 C) 98.1 F (36.7 C) 98.3 F (36.8 C) 97.9 F (36.6 C)  TempSrc: Oral Oral Oral Oral  Resp: 20 18 17 17   Height:      Weight:      SpO2: 100% 98% 98% 98%   General: Alert and awake, oriented x3, not in any acute distress. HEENT: anicteric sclera, pupils reactive to light and accommodation, EOMI CVS: S1-S2 clear, no murmur rubs or gallops Chest: clear to auscultation bilaterally, no wheezing, rales or rhonchi Abdomen: soft nontender, nondistended, normal bowel sounds, no organomegaly Extremities: no cyanosis, clubbing or edema noted bilaterally Neuro: Cranial nerves II-XII intact, no focal neurological deficits  Discharge Instructions     Medication List    STOP taking these medications       cefdinir 300 MG capsule  Commonly known as:  OMNICEF     pseudoephedrine 60 MG tablet  Commonly known as:  SUDAFED      TAKE these medications       acetaminophen 500 MG tablet  Commonly known as:  TYLENOL  Take 1,000 mg by mouth every 6 (six) hours as needed for pain. Pain     albuterol 108 (90 BASE) MCG/ACT inhaler  Commonly known as:  PROVENTIL HFA;VENTOLIN HFA  Inhale 2  puffs into the lungs every 4 (four) hours as needed for wheezing.     docusate sodium 100 MG capsule  Commonly known as:  COLACE  Take 100 mg by mouth 2 (two) times daily.     esomeprazole 40 MG capsule  Commonly known as:  NEXIUM  Take 40 mg by mouth every morning. AM     fluticasone 50 MCG/ACT nasal spray  Commonly known as:  FLONASE  Place 2 sprays into the nose every morning.      HYDROcodone-acetaminophen 5-325 MG per tablet  Commonly known as:  NORCO  Take 1 tablet by mouth every 6 (six) hours as needed for pain.     levofloxacin 750 MG tablet  Commonly known as:  LEVAQUIN  Take 1 tablet (750 mg total) by mouth daily.     loratadine 10 MG tablet  Commonly known as:  CLARITIN  Take 10 mg by mouth every morning.     multivitamin with minerals Tabs  Take 1 tablet by mouth every other day.     oxymetazoline 0.05 % nasal spray  Commonly known as:  AFRIN  Place 2 sprays into the nose 2 (two) times daily as needed for congestion.     phentermine 15 MG capsule  Take 15 mg by mouth daily.     polyethylene glycol packet  Commonly known as:  MIRALAX / GLYCOLAX  Take 17 g by mouth daily as needed (constipation).     zolpidem 12.5 MG CR tablet  Commonly known as:  AMBIEN CR  Take 12.5 mg by mouth at bedtime as needed for sleep. Sleep           Follow-up Information   Follow up with Gwynneth Aliment, MD In 1 week.   Contact information:   [address] [place] [address] [phone]        The results of significant diagnostics from this hospitalization (including imaging, microbiology, ancillary and laboratory) are listed below for reference.    Significant Diagnostic Studies: Dg Chest 2 View  08/21/2012  *RADIOLOGY REPORT*  Clinical Data: Upper respiratory infection, history GERD  CHEST - 2 VIEW  Comparison: 08/20/2012  Findings: Normal heart size, mediastinal contours, and pulmonary vascularity. Right lower lobe infiltrate is atelectasis. Question bilateral nipple shadows. Central peribronchial thickening. Remaining lungs clear. No definite pleural effusion or pneumothorax.  IMPRESSION: Right lower lobe infiltrate versus atelectasis. Mild bronchitic changes.   Original Report Authenticated By: Ulyses Southward, M.D.    Dg Chest 2 View  08/20/2012  *RADIOLOGY REPORT*  Clinical Data: Shortness of breath.  CHEST - 2 VIEW  Comparison: Chest  radiograph performed 01/13/2011  Findings: The lungs are well-aerated.  Mild chronic peribronchial thickening is noted.  There is no evidence of focal opacification, pleural effusion or pneumothorax.  The heart is normal in size; the mediastinal contour is within normal limits.  No acute osseous abnormalities are seen.  Clips are noted within the right upper quadrant, reflecting prior cholecystectomy.  IMPRESSION: No acute cardiopulmonary process seen.  Mild chronic peribronchial thickening noted.   Original Report Authenticated By: Tonia Ghent, M.D.    Ct Maxillofacial Wo Cm  08/20/2012  *RADIOLOGY REPORT*  Clinical Data: Right maxillary pain.  Nasal congestion.  CT MAXILLOFACIAL WITHOUT CONTRAST  Technique:  Multidetector CT imaging of the maxillofacial structures was performed. Multiplanar CT image reconstructions were also generated.  Comparison: CT scan of the head dated 01/13/2011  Findings: There is a tiny air fluid level with slight mucosal thickening  in the left maxillary sinus and there is minimal mucosal thickening in the ethmoid air cells.  Paranasal sinuses are otherwise clear.  No osseous abnormality.  No visible abnormality of the teeth in the mandible or maxilla.  No soft tissue abnormality.  IMPRESSION: No significant abnormality.  Minimal chronic mucosal changes in the ethmoid and left maxillary sinuses. Right maxillary sinus is clear.   Original Report Authenticated By: Francene Boyers, M.D.     Microbiology: No results found for this or any previous visit (from the past 240 hour(s)).   Labs: Basic Metabolic Panel:  Recent Labs Lab 08/21/12 0025 08/21/12 1913  NA 135 138  K 3.5 3.8  CL 97 101  CO2 26 31  GLUCOSE 81 79  BUN 5* 5*  CREATININE 0.71 0.79  CALCIUM 9.2 9.1   Liver Function Tests:  Recent Labs Lab 08/21/12 1913  AST 22  ALT 16  ALKPHOS 68  BILITOT 0.2*  PROT 7.7  ALBUMIN 3.6   No results found for this basename: LIPASE, AMYLASE,  in the last 168  hours No results found for this basename: AMMONIA,  in the last 168 hours CBC:  Recent Labs Lab 08/21/12 0025 08/21/12 1913 08/23/12 0525  WBC 8.8 13.7* 4.7  NEUTROABS  --  10.7*  --   HGB 12.2 11.6* 9.7*  HCT 36.6 34.6* 29.2*  MCV 74.4* 71.9* 73.4*  PLT 246 233 215   Cardiac Enzymes: No results found for this basename: CKTOTAL, CKMB, CKMBINDEX, TROPONINI,  in the last 168 hours BNP: BNP (last 3 results) No results found for this basename: PROBNP,  in the last 8760 hours CBG: No results found for this basename: GLUCAP,  in the last 168 hours     Signed:  Luceil Herrin A  [facility] 08/23/2012, 10:21 AM

## 2012-09-09 ENCOUNTER — Encounter (HOSPITAL_COMMUNITY): Payer: Self-pay | Admitting: *Deleted

## 2012-09-09 ENCOUNTER — Emergency Department (HOSPITAL_COMMUNITY)
Admission: EM | Admit: 2012-09-09 | Discharge: 2012-09-09 | Disposition: A | Discharge status: Home / Self Care | Payer: Medicaid Other | Attending: Emergency Medicine | Admitting: Emergency Medicine

## 2012-09-09 DIAGNOSIS — IMO0002 Reserved for concepts with insufficient information to code with codable children: Secondary | ICD-10-CM | POA: Insufficient documentation

## 2012-09-09 DIAGNOSIS — Z862 Personal history of diseases of the blood and blood-forming organs and certain disorders involving the immune mechanism: Secondary | ICD-10-CM | POA: Insufficient documentation

## 2012-09-09 DIAGNOSIS — R221 Localized swelling, mass and lump, neck: Secondary | ICD-10-CM | POA: Insufficient documentation

## 2012-09-09 DIAGNOSIS — L290 Pruritus ani: Secondary | ICD-10-CM | POA: Insufficient documentation

## 2012-09-09 DIAGNOSIS — Z79899 Other long term (current) drug therapy: Secondary | ICD-10-CM | POA: Insufficient documentation

## 2012-09-09 DIAGNOSIS — R220 Localized swelling, mass and lump, head: Secondary | ICD-10-CM | POA: Insufficient documentation

## 2012-09-09 DIAGNOSIS — K219 Gastro-esophageal reflux disease without esophagitis: Secondary | ICD-10-CM | POA: Insufficient documentation

## 2012-09-09 DIAGNOSIS — R110 Nausea: Secondary | ICD-10-CM | POA: Insufficient documentation

## 2012-09-09 DIAGNOSIS — Z8719 Personal history of other diseases of the digestive system: Secondary | ICD-10-CM | POA: Insufficient documentation

## 2012-09-09 DIAGNOSIS — Z8742 Personal history of other diseases of the female genital tract: Secondary | ICD-10-CM | POA: Insufficient documentation

## 2012-09-09 DIAGNOSIS — T50995A Adverse effect of other drugs, medicaments and biological substances, initial encounter: Secondary | ICD-10-CM | POA: Insufficient documentation

## 2012-09-09 DIAGNOSIS — R062 Wheezing: Secondary | ICD-10-CM | POA: Insufficient documentation

## 2012-09-09 DIAGNOSIS — T7840XA Allergy, unspecified, initial encounter: Secondary | ICD-10-CM

## 2012-09-09 DIAGNOSIS — R0602 Shortness of breath: Secondary | ICD-10-CM | POA: Insufficient documentation

## 2012-09-09 MED ORDER — DIPHENHYDRAMINE HCL 50 MG/ML IJ SOLN
25.0000 mg | Freq: Once | INTRAMUSCULAR | Status: AC
  Administered 2012-09-09: 25 mg via INTRAVENOUS
  Filled 2012-09-09: qty 1

## 2012-09-09 MED ORDER — DEXAMETHASONE SODIUM PHOSPHATE 10 MG/ML IJ SOLN
10.0000 mg | Freq: Once | INTRAMUSCULAR | Status: AC
  Administered 2012-09-09: 10 mg via INTRAVENOUS
  Filled 2012-09-09: qty 1

## 2012-09-09 MED ORDER — EPINEPHRINE 0.3 MG/0.3ML IJ SOAJ: 0.3000 mg | Freq: Once | INTRAMUSCULAR | Status: DC | PRN

## 2012-09-09 NOTE — ED Notes (Signed)
MD at bedside. 

## 2012-09-09 NOTE — ED Notes (Addendum)
Pt states she is seeing an allergist and had allergy shots today and Monday. Reports today noticing swelling around lips and itchy skin. Pt had shots around 0845 today. Noticed symptoms around 330pm today.

## 2012-09-09 NOTE — ED Provider Notes (Signed)
History     CSN: [id number]  Arrival date & time 09/09/12  1839   First MD Initiated Contact with Patient 09/09/12 1948      Chief Complaint  Patient presents with  . Allergic Reaction    (Consider location/radiation/quality/duration/timing/severity/associated sxs/prior treatment) HPI  Pt is a 37 yo F presenting to the ED for lip swelling and itching throat that began around 3pm after receiving allergy shot earlier in the day today. Pt states this is the second shot she has ever received, first one being on Monday w/o issue on Monday. Associated SOB w/o wheezing and nausea. No alleviating or aggravating factors. Denies CP, fevers, chills, abdominal pain, diarrhea, urinary symptoms.   Past Medical History  Diagnosis Date  . GERD (gastroesophageal reflux disease)   . Anemia   . Constipation   . Hx of endometriosis   . Macromastia 07/2011    Past Surgical History  Procedure Laterality Date  . Salpingectomy  12/12/2003    right; with TAH  . Cesarean section  12/29/2000; 1994  . Bladder neck reconstruction  01/14/2011    Procedure: BLADDER NECK REPAIR;  Surgeon: Reva Bores, MD;  Location: [facility];  Service: Gynecology;  Laterality: N/A;  Laparoscopic Repair of Incidental Cystotomy  . Ankle arthroscopy  12/29/2007    right; with extensive debridement  . Abdominal hysterectomy  12/12/2003  . Tubal ligation  12/29/2000  . Dilation and curettage of uterus  07/08/2003    open laparoscopy  . Repair peroneal tendons ankle  01/03/2010    repair right subluxing peroneal tendons  . Tonsillectomy  as a child  . Adenoidectomy  05/2011  . Laparoscopic salpingoopherectomy  01/14/2011    left  . Right oophorectomy      with lysis of adhesions  . Breast reduction surgery  08/05/2011    Procedure: MAMMARY REDUCTION  (BREAST);  Surgeon: Louisa Second, MD;  Location: [facility];  Service: Plastics;  Laterality: Bilateral;  bilateral    Family History  Problem Relation Age of Onset   . Diabetes Mother   . Hypertension Mother   . Thyroid disease Sister     History  Substance Use Topics  . Smoking status: Never Smoker   . Smokeless tobacco: Never Used  . Alcohol Use: No    OB History   Grav Para Term Preterm Abortions TAB SAB Ect Mult Living   2 2 2       2       Review of Systems  Constitutional: Negative for fever and chills.  HENT: Positive for facial swelling.   Eyes: Negative for visual disturbance.  Respiratory: Positive for shortness of breath. Negative for chest tightness and wheezing.   Cardiovascular: Negative for chest pain and palpitations.  Gastrointestinal: Positive for nausea. Negative for vomiting, abdominal pain, diarrhea, constipation and abdominal distention.  Genitourinary: Negative for dysuria.  Musculoskeletal: Negative for back pain.  Skin: Negative.   Neurological: Negative for headaches.    Allergies  Review of patient's allergies indicates no known allergies.  Home Medications   Current Outpatient Rx  Name  Route  Sig  Dispense  Refill  . acetaminophen (TYLENOL) 500 MG tablet   Oral   Take 1,000 mg by mouth every 6 (six) hours as needed for pain. Pain         . albuterol (PROVENTIL HFA;VENTOLIN HFA) 108 (90 BASE) MCG/ACT inhaler   Inhalation   Inhale 2 puffs into the lungs every 4 (four) hours as needed for  wheezing.   1 Inhaler   0   . docusate sodium (COLACE) 100 MG capsule   Oral   Take 100 mg by mouth 2 (two) times daily.         [name] esomeprazole (NEXIUM) 40 MG capsule   Oral   Take 40 mg by mouth every morning. AM         . fluticasone (FLONASE) 50 MCG/ACT nasal spray   Nasal   Place 2 sprays into the nose every morning.         [name] HYDROcodone-acetaminophen (NORCO) 5-325 MG per tablet   Oral   Take 1 tablet by mouth every 6 (six) hours as needed for pain.   20 tablet   0   . ibuprofen (ADVIL,MOTRIN) 50 MG chewable tablet   Oral   Chew 150 mg by mouth every 8 (eight) hours as needed for pain.          [name] loratadine (CLARITIN) 10 MG tablet   Oral   Take 10 mg by mouth every morning.         . Multiple Vitamin (MULTIVITAMIN WITH MINERALS) TABS   Oral   Take 1 tablet by mouth every other day.         . phentermine 15 MG capsule   Oral   Take 15 mg by mouth daily.         . polyethylene glycol (MIRALAX / GLYCOLAX) packet   Oral   Take 17 g by mouth daily as needed (constipation).          [name] zolpidem (AMBIEN CR) 12.5 MG CR tablet   Oral   Take 12.5 mg by mouth at bedtime as needed for sleep. Sleep         . EPINEPHrine (EPIPEN 2-PAK) 0.3 mg/0.3 mL DEVI   Intramuscular   Inject 0.3 mLs (0.3 mg total) into the muscle once as needed (for severe allergic reaction). CAll 911 immediately if you have to use this medicine   1 Device   1   . levofloxacin (LEVAQUIN) 750 MG tablet   Oral   Take 1 tablet (750 mg total) by mouth daily.   5 tablet   0     BP 125/83  Pulse 85  Temp(Src) 98.8 F (37.1 C) (Oral)  Resp 16  SpO2 99%  LMP 12/27/2003  Physical Exam  Constitutional: She is oriented to person, place, and time. She appears well-developed and well-nourished.  HENT:  Head: Normocephalic and atraumatic.  Mouth/Throat: Uvula is midline, oropharynx is clear and moist and mucous membranes are normal. No oropharyngeal exudate, posterior oropharyngeal edema, posterior oropharyngeal erythema or tonsillar abscesses.  Eyes: Conjunctivae and EOM are normal. Pupils are equal, round, and reactive to light.  Neck: Normal range of motion. Neck supple. No tracheal deviation present.  Cardiovascular: Normal rate, regular rhythm and normal heart sounds.   Pulmonary/Chest: Effort normal and breath sounds normal. No accessory muscle usage or stridor. Not tachypneic and not bradypneic. No respiratory distress. She has no decreased breath sounds. She has no wheezes. She has no rhonchi. She has no rales. She exhibits no tenderness.  Abdominal: Soft. Bowel sounds are normal. There  is no tenderness.  Lymphadenopathy:    She has no cervical adenopathy.  Neurological: She is alert and oriented to person, place, and time.  Skin: Skin is warm and dry. No rash noted. No pallor.  Psychiatric: She has a normal mood and affect.    ED Course  Procedures (including  critical care time)  Medications  dexamethasone (DECADRON) injection 10 mg (10 mg Intravenous Given 09/09/12 2012)  diphenhydrAMINE (BENADRYL) injection 25 mg (25 mg Intravenous Given 09/09/12 2013)   Symptoms resolved after treatment.   Labs Reviewed - No data to display No results found.   1. Allergic reaction, initial encounter       MDM  Patient re-evaluated prior to dc, is hemodynamically stable, in no respiratory distress, and denies the feeling of throat closing. Pt has been advised to take OTC benadryl, given epi-pen and instructions on use, & return to the ED if they have a mod-severe allergic rxn (s/s including throat closing, difficulty breathing, swelling of lips face or tongue). Pt is to follow up with their allergist. Pt is agreeable with plan & verbalizes understanding. Patient d/w with Dr. Juleen China, agrees with plan.           Lise Auer Scout Gumbs, PA-C 09/10/12 0110

## 2012-09-10 NOTE — ED Provider Notes (Signed)
Medical screening examination/treatment/procedure(s) were conducted as a shared visit with non-physician practitioner(s) and myself.  I personally evaluated the patient during the encounter.  37 year old female with possible mild allergic reaction. Patient claims symptoms of lip swelling and facial itching. Objectively no findings my exam. No swelling appreciated. Oropharynx is clear. Handling secretions. Normal sounding phonation. No stridor. Clear with good air movement. Patient is HD stable. She was observed in emergency room for a period of several hours without worsening of her symptoms. She was treated with steroids and Benadryl. She was provided prescription for an EpiPen. She already had an allergist whom she can follow up with.  Raeford Razor, MD 09/10/12 [phone]

## 2012-09-12 ENCOUNTER — Other Ambulatory Visit: Payer: Self-pay

## 2012-09-12 ENCOUNTER — Emergency Department (HOSPITAL_COMMUNITY)
Admission: EM | Admit: 2012-09-12 | Discharge: 2012-09-12 | Disposition: A | Discharge status: Home / Self Care | Payer: Medicaid Other | Source: Home / Self Care | Attending: Emergency Medicine | Admitting: Emergency Medicine

## 2012-09-12 ENCOUNTER — Encounter (HOSPITAL_COMMUNITY): Payer: Self-pay | Admitting: Adult Health

## 2012-09-12 ENCOUNTER — Encounter (HOSPITAL_COMMUNITY): Payer: Self-pay | Admitting: Cardiology

## 2012-09-12 ENCOUNTER — Emergency Department (HOSPITAL_COMMUNITY)
Admission: EM | Admit: 2012-09-12 | Discharge: 2012-09-12 | Disposition: A | Discharge status: Home / Self Care | Payer: Medicaid Other | Attending: Emergency Medicine | Admitting: Emergency Medicine

## 2012-09-12 ENCOUNTER — Emergency Department (HOSPITAL_COMMUNITY): Payer: Medicaid Other

## 2012-09-12 DIAGNOSIS — Z79899 Other long term (current) drug therapy: Secondary | ICD-10-CM | POA: Insufficient documentation

## 2012-09-12 DIAGNOSIS — Z8719 Personal history of other diseases of the digestive system: Secondary | ICD-10-CM | POA: Insufficient documentation

## 2012-09-12 DIAGNOSIS — K219 Gastro-esophageal reflux disease without esophagitis: Secondary | ICD-10-CM | POA: Insufficient documentation

## 2012-09-12 DIAGNOSIS — IMO0002 Reserved for concepts with insufficient information to code with codable children: Secondary | ICD-10-CM | POA: Insufficient documentation

## 2012-09-12 DIAGNOSIS — Z862 Personal history of diseases of the blood and blood-forming organs and certain disorders involving the immune mechanism: Secondary | ICD-10-CM | POA: Insufficient documentation

## 2012-09-12 DIAGNOSIS — B349 Viral infection, unspecified: Secondary | ICD-10-CM

## 2012-09-12 DIAGNOSIS — Z8742 Personal history of other diseases of the female genital tract: Secondary | ICD-10-CM | POA: Insufficient documentation

## 2012-09-12 DIAGNOSIS — D649 Anemia, unspecified: Secondary | ICD-10-CM | POA: Insufficient documentation

## 2012-09-12 DIAGNOSIS — B9789 Other viral agents as the cause of diseases classified elsewhere: Secondary | ICD-10-CM | POA: Insufficient documentation

## 2012-09-12 DIAGNOSIS — R071 Chest pain on breathing: Secondary | ICD-10-CM | POA: Insufficient documentation

## 2012-09-12 DIAGNOSIS — J069 Acute upper respiratory infection, unspecified: Secondary | ICD-10-CM | POA: Insufficient documentation

## 2012-09-12 DIAGNOSIS — R0789 Other chest pain: Secondary | ICD-10-CM

## 2012-09-12 LAB — D-DIMER, QUANTITATIVE: D-Dimer, Quant: <0.27 ug/mL-FEU (ref 0.00–0.48)

## 2012-09-12 LAB — POCT I-STAT TROPONIN I
Troponin i, poc: 0 ng/mL (ref 0.00–0.08)
Troponin i, poc: 0 ng/mL (ref 0.00–0.08)

## 2012-09-12 LAB — URINALYSIS, ROUTINE W REFLEX MICROSCOPIC
Bilirubin Urine: NEGATIVE
Glucose, UA: NEGATIVE mg/dL
Hgb urine dipstick: NEGATIVE
Ketones, ur: NEGATIVE mg/dL
Leukocytes, UA: NEGATIVE
Nitrite: NEGATIVE
Protein, ur: NEGATIVE mg/dL
Specific Gravity, Urine: 1.007 (ref 1.005–1.030)
Urobilinogen, UA: 0.2 mg/dL (ref 0.0–1.0)
pH: 7 (ref 5.0–8.0)

## 2012-09-12 LAB — BASIC METABOLIC PANEL
BUN: 8 mg/dL (ref 6–23)
BUN: 8 mg/dL (ref 6–23)
CO2: 27 mEq/L (ref 19–32)
CO2: 25 mEq/L (ref 19–32)
Calcium: 9 mg/dL (ref 8.4–10.5)
Calcium: 9.2 mg/dL (ref 8.4–10.5)
Chloride: 102 mEq/L (ref 96–112)
Chloride: 102 mEq/L (ref 96–112)
Creatinine, Ser: 0.82 mg/dL (ref 0.50–1.10)
Creatinine, Ser: 0.77 mg/dL (ref 0.50–1.10)
GFR calc Af Amer: >90 mL/min (ref >90)
GFR calc Af Amer: >90 mL/min (ref >90)
GFR calc non Af Amer: >90 mL/min (ref >90)
GFR calc non Af Amer: >90 mL/min (ref >90)
Glucose, Bld: 94 mg/dL (ref 70–99)
Glucose, Bld: 91 mg/dL (ref 70–99)
Potassium: 3.4 mEq/L — ABNORMAL LOW (ref 3.5–5.1)
Potassium: 3.5 mEq/L (ref 3.5–5.1)
Sodium: 138 mEq/L (ref 135–145)
Sodium: 138 mEq/L (ref 135–145)

## 2012-09-12 LAB — CBC
HCT: 31.1 % — ABNORMAL LOW (ref 36.0–46.0)
HCT: 32.4 % — ABNORMAL LOW (ref 36.0–46.0)
Hemoglobin: 10 g/dL — ABNORMAL LOW (ref 12.0–15.0)
Hemoglobin: 10.4 g/dL — ABNORMAL LOW (ref 12.0–15.0)
MCH: 24 pg — ABNORMAL LOW (ref 26.0–34.0)
MCH: 24 pg — ABNORMAL LOW (ref 26.0–34.0)
MCHC: 32.2 g/dL (ref 30.0–36.0)
MCHC: 32.1 g/dL (ref 30.0–36.0)
MCV: 74.8 fL — ABNORMAL LOW (ref 78.0–100.0)
MCV: 74.8 fL — ABNORMAL LOW (ref 78.0–100.0)
Platelets: 358 10*3/uL (ref 150–400)
Platelets: 314 10*3/uL (ref 150–400)
RBC: 4.16 MIL/uL (ref 3.87–5.11)
RBC: 4.33 MIL/uL (ref 3.87–5.11)
RDW: 13.7 % (ref 11.5–15.5)
RDW: 13.5 % (ref 11.5–15.5)
WBC: 6.7 10*3/uL (ref 4.0–10.5)
WBC: 6.7 10*3/uL (ref 4.0–10.5)

## 2012-09-12 LAB — RAPID STREP SCREEN (MED CTR MEBANE ONLY): Streptococcus, Group A Screen (Direct): NEGATIVE

## 2012-09-12 MED ORDER — IBUPROFEN 200 MG PO TABS
400.0000 mg | ORAL_TABLET | Freq: Once | ORAL | Status: AC
  Administered 2012-09-12: 400 mg via ORAL
  Filled 2012-09-12: qty 2

## 2012-09-12 MED ORDER — NAPROXEN 250 MG PO TABS: 250.0000 mg | ORAL_TABLET | Freq: Two times a day (BID) | ORAL | Status: DC

## 2012-09-12 MED ORDER — ACETAMINOPHEN 500 MG PO TABS
1000.0000 mg | ORAL_TABLET | Freq: Once | ORAL | Status: AC
  Administered 2012-09-12: 1000 mg via ORAL
  Filled 2012-09-12: qty 2

## 2012-09-12 MED ORDER — HYDROCODONE-ACETAMINOPHEN 5-325 MG PO TABS: 2.0000 | ORAL_TABLET | Freq: Four times a day (QID) | ORAL | Status: DC | PRN

## 2012-09-12 NOTE — ED Notes (Addendum)
Pt reports left sided chest pain described as tightness, fatigue, and general body aches that began this am. Reports feeling "hot all day and sweaty" associated with nausea, denies SOB. Nothing makes pain better or worse.

## 2012-09-12 NOTE — ED Provider Notes (Signed)
History     CSN: [id number]  Arrival date & time 09/12/12  1304   First MD Initiated Contact with Patient 09/12/12 1447      Chief Complaint  Patient presents with  . Chest Pain    (Consider location/radiation/quality/duration/timing/severity/associated sxs/prior treatment) HPI This 37 year old female complains of a few days of constant 24-hour a day generalized body aches including a constant burning pain in her chest which is worse with position palpation and torso movement but nonexertional nonpleuritic with mild cough but no shortness breath no fever no rash no confusion she does have generalized body aches with myalgias and arthralgias but no rashes no focal or lateralizing weakness or numbness no abdominal pain no vomiting no diarrhea she had some nausea earlier today but not now she was seen in the emergency room and already with negative chest x-ray negative d-dimer negative troponin and returns for ongoing generalized pain to her entire body but no confusion severe headache stiff neck and no treatment prior to arrival she does have some mild nasal congestion. Past Medical History  Diagnosis Date  . GERD (gastroesophageal reflux disease)   . Anemia   . Constipation   . Hx of endometriosis   . Macromastia 07/2011    Past Surgical History  Procedure Laterality Date  . Salpingectomy  12/12/2003    right; with TAH  . Cesarean section  12/29/2000; 1994  . Bladder neck reconstruction  01/14/2011    Procedure: BLADDER NECK REPAIR;  Surgeon: Reva Bores, MD;  Location: [facility];  Service: Gynecology;  Laterality: N/A;  Laparoscopic Repair of Incidental Cystotomy  . Ankle arthroscopy  12/29/2007    right; with extensive debridement  . Abdominal hysterectomy  12/12/2003  . Tubal ligation  12/29/2000  . Dilation and curettage of uterus  07/08/2003    open laparoscopy  . Repair peroneal tendons ankle  01/03/2010    repair right subluxing peroneal tendons  . Tonsillectomy  as a child  .  Adenoidectomy  05/2011  . Laparoscopic salpingoopherectomy  01/14/2011    left  . Right oophorectomy      with lysis of adhesions  . Breast reduction surgery  08/05/2011    Procedure: MAMMARY REDUCTION  (BREAST);  Surgeon: Louisa Second, MD;  Location: [facility];  Service: Plastics;  Laterality: Bilateral;  bilateral    Family History  Problem Relation Age of Onset  . Diabetes Mother   . Hypertension Mother   . Thyroid disease Sister     History  Substance Use Topics  . Smoking status: Never Smoker   . Smokeless tobacco: Never Used  . Alcohol Use: No    OB History   Grav Para Term Preterm Abortions TAB SAB Ect Mult Living   2 2 2       2       Review of Systems 10 Systems reviewed and are negative for acute change except as noted in the HPI. Allergies  Review of patient's allergies indicates no known allergies.  Home Medications   Current Outpatient Rx  Name  Route  Sig  Dispense  Refill  . acetaminophen (TYLENOL) 500 MG tablet   Oral   Take 1,000 mg by mouth every 6 (six) hours as needed for pain. Pain         . albuterol (PROVENTIL HFA;VENTOLIN HFA) 108 (90 BASE) MCG/ACT inhaler   Inhalation   Inhale 2 puffs into the lungs every 4 (four) hours as needed for wheezing.   1 Inhaler  0   . docusate sodium (COLACE) 100 MG capsule   Oral   Take 100 mg by mouth 2 (two) times daily.         [name] esomeprazole (NEXIUM) 40 MG capsule   Oral   Take 40 mg by mouth every morning. AM         . fluticasone (FLONASE) 50 MCG/ACT nasal spray   Nasal   Place 2 sprays into the nose every morning.         . loratadine (CLARITIN) 10 MG tablet   Oral   Take 10 mg by mouth every morning.         . Multiple Vitamin (MULTIVITAMIN WITH MINERALS) TABS   Oral   Take 1 tablet by mouth every other day.         . phentermine 15 MG capsule   Oral   Take 15 mg by mouth daily.         . polyethylene glycol (MIRALAX / GLYCOLAX) packet   Oral   Take 17  g by mouth daily as needed (constipation).          [name] zolpidem (AMBIEN CR) 12.5 MG CR tablet   Oral   Take 12.5 mg by mouth at bedtime as needed for sleep. Sleep         . EPINEPHrine (EPIPEN 2-PAK) 0.3 mg/0.3 mL DEVI   Intramuscular   Inject 0.3 mLs (0.3 mg total) into the muscle once as needed (for severe allergic reaction). CAll 911 immediately if you have to use this medicine   1 Device   1   . HYDROcodone-acetaminophen (NORCO) 5-325 MG per tablet   Oral   Take 2 tablets by mouth every 6 (six) hours as needed for pain.   20 tablet   0     BP 128/87  Pulse 67  Temp(Src) 98.7 F (37.1 C) (Oral)  Resp 14  SpO2 100%  LMP 12/27/2003  Physical Exam  Nursing note and vitals reviewed. Constitutional:  Awake, alert, nontoxic appearance.  HENT:  Head: Atraumatic.  Eyes: Right eye exhibits no discharge. Left eye exhibits no discharge.  Neck: Neck supple.  Cardiovascular: Normal rate and regular rhythm.   No murmur heard. Pulmonary/Chest: Effort normal and breath sounds normal. No respiratory distress. She has no wheezes. She has no rales. She exhibits tenderness.  Abdominal: Soft. Bowel sounds are normal. She exhibits no distension and no mass. There is no tenderness. There is no rebound and no guarding.  Musculoskeletal: She exhibits tenderness. She exhibits no edema.  Baseline ROM, no obvious new focal weakness. Patient has diffuse tenderness over her entire chest back and all 4 extremities.  Neurological: She is alert.  Mental status and motor strength appears baseline for patient and situation.  Skin: No rash noted.  Psychiatric: She has a normal mood and affect.    ED Course  Procedures (including critical care time) ECG: Normal sinus rhythm, ventricular rate 73, normal axis, normal intervals, no acute ischemic changes noted, no significant change compared with April 2014  Labs Reviewed  CBC - Abnormal; Notable for the following:    Hemoglobin 10.4 (*)    HCT  32.4 (*)    MCV 74.8 (*)    MCH 24.0 (*)    All other components within normal limits  BASIC METABOLIC PANEL  POCT I-STAT TROPONIN I   Dg Chest 2 View  09/12/2012  *RADIOLOGY REPORT*  Clinical Data: Left side chest pain.  CHEST - 2 VIEW  Comparison: Plain films of the chest 08/21/2012 and CT chest 08/25/2010.  Findings: Lungs are clear.  Heart size is normal.  No pneumothorax or pleural effusion.  IMPRESSION: Negative chest.   Original Report Authenticated By: Holley Dexter, M.D.      1. Viral syndrome       MDM  Patient / Family / Caregiver informed of clinical course, understand medical decision-making process, and agree with plan.I doubt any other EMC precluding discharge at this time including, but not necessarily limited to the following:sepsis.        Hurman Horn, MD 09/12/12 [phone]

## 2012-09-12 NOTE — ED Notes (Signed)
Pt continues to report other physical changes . Pt reports a white mark on the skin near sternum. No rashes seen no swelling seen .

## 2012-09-12 NOTE — ED Notes (Signed)
Pt reports left sided chest pain that radiates down into her left arm. States she was just seen here this morning and dx with a URI and given medication but has not taken it. States that she feels nauseated. Denies and SOB. Skin warm and dry. No distress noted.

## 2012-09-12 NOTE — ED Provider Notes (Signed)
History     CSN: [id number]  Arrival date & time 09/12/12  0014   First MD Initiated Contact with Patient 09/12/12 0207      Chief Complaint  Patient presents with  . Chest Pain     HPI Pt was seen at 0240.   Per pt, c/o gradual onset and persistence of constant sore throat, runny/stuffy nose, sinus congestion, and cough for the past 2-3 days. Has been associated with generalized weakness, fatigue and body aches.  States she woke up with left upper chest wall "pain" yesterday morning, which has been constant, worsens with cough, body position changes and palpation of the area. Denies fevers, no rash, no palpitations, no SOB, no N/V/D, no abd pain, no back pain.     Past Medical History  Diagnosis Date  . GERD (gastroesophageal reflux disease)   . Anemia   . Constipation   . Hx of endometriosis   . Macromastia 07/2011    Past Surgical History  Procedure Laterality Date  . Salpingectomy  12/12/2003    right; with TAH  . Cesarean section  12/29/2000; 1994  . Bladder neck reconstruction  01/14/2011    Procedure: BLADDER NECK REPAIR;  Surgeon: Reva Bores, MD;  Location: [facility];  Service: Gynecology;  Laterality: N/A;  Laparoscopic Repair of Incidental Cystotomy  . Ankle arthroscopy  12/29/2007    right; with extensive debridement  . Abdominal hysterectomy  12/12/2003  . Tubal ligation  12/29/2000  . Dilation and curettage of uterus  07/08/2003    open laparoscopy  . Repair peroneal tendons ankle  01/03/2010    repair right subluxing peroneal tendons  . Tonsillectomy  as a child  . Adenoidectomy  05/2011  . Laparoscopic salpingoopherectomy  01/14/2011    left  . Right oophorectomy      with lysis of adhesions  . Breast reduction surgery  08/05/2011    Procedure: MAMMARY REDUCTION  (BREAST);  Surgeon: Louisa Second, MD;  Location: [facility];  Service: Plastics;  Laterality: Bilateral;  bilateral    Family History  Problem Relation Age of Onset  . Diabetes Mother    . Hypertension Mother   . Thyroid disease Sister     History  Substance Use Topics  . Smoking status: Never Smoker   . Smokeless tobacco: Never Used  . Alcohol Use: No    OB History   Grav Para Term Preterm Abortions TAB SAB Ect Mult Living   2 2 2       2       Review of Systems ROS: Statement: All systems negative except as marked or noted in the HPI; Constitutional: Negative for fever and chills. +generalized weakness, body aches, fatigue.; ; Eyes: Negative for eye pain, redness and discharge. ; ; ENMT: Negative for ear pain, hoarseness, +nasal congestion, sinus pressure and sore throat. ; ; Cardiovascular: +CP. Negative for palpitations, diaphoresis, dyspnea and peripheral edema. ; ; Respiratory: +cough. Negative for wheezing and stridor. ; ; Gastrointestinal:  Negative for nausea, vomiting, diarrhea, abdominal pain, blood in stool, hematemesis, jaundice and rectal bleeding. ; ; Genitourinary: Negative for dysuria, flank pain and hematuria. ; ; Musculoskeletal: Negative for back pain and neck pain. Negative for swelling and trauma.; ; Skin: Negative for pruritus, rash, abrasions, blisters, bruising and skin lesion.; ; Neuro: +lightheadedness. Negative for headache and neck stiffness. Negative for weakness, altered level of consciousness , altered mental status, extremity weakness, paresthesias, involuntary movement, seizure and syncope.  Allergies  Review of patient's allergies indicates no known allergies.  Home Medications   Current Outpatient Rx  Name  Route  Sig  Dispense  Refill  . acetaminophen (TYLENOL) 500 MG tablet   Oral   Take 1,000 mg by mouth every 6 (six) hours as needed for pain. Pain         . albuterol (PROVENTIL HFA;VENTOLIN HFA) 108 (90 BASE) MCG/ACT inhaler   Inhalation   Inhale 2 puffs into the lungs every 4 (four) hours as needed for wheezing.   1 Inhaler   0   . docusate sodium (COLACE) 100 MG capsule   Oral   Take 100 mg by mouth 2 (two)  times daily.         [name] EPINEPHrine (EPIPEN 2-PAK) 0.3 mg/0.3 mL DEVI   Intramuscular   Inject 0.3 mLs (0.3 mg total) into the muscle once as needed (for severe allergic reaction). CAll 911 immediately if you have to use this medicine   1 Device   1   . esomeprazole (NEXIUM) 40 MG capsule   Oral   Take 40 mg by mouth every morning. AM         . fluticasone (FLONASE) 50 MCG/ACT nasal spray   Nasal   Place 2 sprays into the nose every morning.         [name] ibuprofen (ADVIL,MOTRIN) 50 MG chewable tablet   Oral   Chew 150 mg by mouth every 8 (eight) hours as needed for pain.         [name] loratadine (CLARITIN) 10 MG tablet   Oral   Take 10 mg by mouth every morning.         . Multiple Vitamin (MULTIVITAMIN WITH MINERALS) TABS   Oral   Take 1 tablet by mouth every other day.         . phentermine 15 MG capsule   Oral   Take 15 mg by mouth daily.         . polyethylene glycol (MIRALAX / GLYCOLAX) packet   Oral   Take 17 g by mouth daily as needed (constipation).          [name] zolpidem (AMBIEN CR) 12.5 MG CR tablet   Oral   Take 12.5 mg by mouth at bedtime as needed for sleep. Sleep           BP 149/86  Pulse 77  Temp(Src) 98.9 F (37.2 C) (Oral)  Resp 16  SpO2 100%  LMP 12/27/2003  Physical Exam 0245: Physical examination:  Nursing notes reviewed; Vital signs and O2 SAT reviewed;  Constitutional: Well developed, Well nourished, Well hydrated, In no acute distress; Head:  Normocephalic, atraumatic; Eyes: EOMI, PERRL, No scleral icterus; ENMT: TM's clear bilat. +edemetous nasal turbinates bilat with clear rhinorrhea.  Mouth and pharynx normal, Mucous membranes moist; Neck: Supple, Full range of motion, No lymphadenopathy; Cardiovascular: Regular rate and rhythm, No murmur, rub, or gallop; Respiratory: Breath sounds clear & equal bilaterally, No rales, rhonchi, wheezes.  Speaking full sentences with ease, Normal respiratory effort/excursion; Chest: +left upper  anterior chest wall tender to palp. No rash, no soft tissue crepitus. Movement normal; Abdomen: Soft, Nontender, Nondistended, Normal bowel sounds; Genitourinary: No CVA tenderness; Extremities: Pulses normal, No tenderness, No edema, No calf edema or asymmetry.; Neuro: AA&Ox3, Major CN grossly intact.  Speech clear. No gross focal motor or sensory deficits in extremities.; Skin: Color normal, Warm, Dry.   ED Course  Procedures     MDM  MDM Reviewed: previous chart, nursing note and vitals Reviewed previous: labs and ECG Interpretation: labs, ECG and x-ray    Date: 09/12/2012  Rate: 70  Rhythm: normal sinus rhythm  QRS Axis: normal  Intervals: normal  ST/T Wave abnormalities: normal  Conduction Disutrbances:none  Narrative Interpretation:   Old EKG Reviewed: unchanged; no significant changes from previous EKG dated 08/21/2012.  Results for orders placed during the hospital encounter of 09/12/12  RAPID STREP SCREEN      Result Value Range   Streptococcus, Group A Screen (Direct) NEGATIVE  NEGATIVE  CBC      Result Value Range   WBC 6.7  4.0 - 10.5 K/uL   RBC 4.16  3.87 - 5.11 MIL/uL   Hemoglobin 10.0 (*) 12.0 - 15.0 g/dL   HCT 65.7 (*) 84.6 - 96.2 %   MCV 74.8 (*) 78.0 - 100.0 fL   MCH 24.0 (*) 26.0 - 34.0 pg   MCHC 32.2  30.0 - 36.0 g/dL   RDW 95.2  84.1 - 32.4 %   Platelets 358  150 - 400 K/uL  BASIC METABOLIC PANEL      Result Value Range   Sodium 138  135 - 145 mEq/L   Potassium 3.4 (*) 3.5 - 5.1 mEq/L   Chloride 102  96 - 112 mEq/L   CO2 27  19 - 32 mEq/L   Glucose, Bld 94  70 - 99 mg/dL   BUN 8  6 - 23 mg/dL   Creatinine, Ser 4.01  0.50 - 1.10 mg/dL   Calcium 9.0  8.4 - 02.7 mg/dL   GFR calc non Af Amer >90  >90 mL/min   GFR calc Af Amer >90  >90 mL/min  URINALYSIS, ROUTINE W REFLEX MICROSCOPIC      Result Value Range   Color, Urine YELLOW  YELLOW   APPearance CLEAR  CLEAR   Specific Gravity, Urine 1.007  1.005 - 1.030   pH 7.0  5.0 - 8.0   Glucose, UA  NEGATIVE  NEGATIVE mg/dL   Hgb urine dipstick NEGATIVE  NEGATIVE   Bilirubin Urine NEGATIVE  NEGATIVE   Ketones, ur NEGATIVE  NEGATIVE mg/dL   Protein, ur NEGATIVE  NEGATIVE mg/dL   Urobilinogen, UA 0.2  0.0 - 1.0 mg/dL   Nitrite NEGATIVE  NEGATIVE   Leukocytes, UA NEGATIVE  NEGATIVE  D-DIMER, QUANTITATIVE      Result Value Range   D-Dimer, Quant <0.27  0.00 - 0.48 ug/mL-FEU  POCT I-STAT TROPONIN I      Result Value Range   Troponin i, poc 0.00  0.00 - 0.08 ng/mL   Comment 3            Dg Chest 2 View 09/12/2012  *RADIOLOGY REPORT*  Clinical Data: Left side chest pain.  CHEST - 2 VIEW  Comparison: Plain films of the chest 08/21/2012 and CT chest 08/25/2010.  Findings: Lungs are clear.  Heart size is normal.  No pneumothorax or pleural effusion.  IMPRESSION: Negative chest.   Original Report Authenticated By: Holley Dexter, M.D.     [id number]:  Feels improved after meds and wants to go home now. Doubt PE as cause for symptoms with normal d-dimer and low risk Wells.  Doubt ACS as cause for symptoms with normal troponin and unchanged EKG from previous after 1 day of constant symptoms. Will tx symptomatically at this time. Dx and testing d/w pt.  Questions answered.  Verb understanding, agreeable to d/c home with outpt f/u.  Laray Anger, DO 09/15/12 1546

## 2012-09-12 NOTE — ED Notes (Signed)
PT returns to ED today after a recent DX of URI. Pt now reports Bil pain to the  inner aspects of ankles. Pt then reports Both knees burn . Pt also reports Pian to LT chest and Lt arm . Pt then reports pain to LT side of neck. Pt is crying and has a clear nasal drainage. During assessment Pt is restless and is observed rubbing Lt side of chest.

## 2012-09-16 ENCOUNTER — Emergency Department (HOSPITAL_COMMUNITY): Payer: Medicaid Other

## 2012-09-16 ENCOUNTER — Emergency Department (HOSPITAL_COMMUNITY)
Admission: EM | Admit: 2012-09-16 | Discharge: 2012-09-16 | Disposition: A | Discharge status: Home / Self Care | Payer: Medicaid Other | Attending: Emergency Medicine | Admitting: Emergency Medicine

## 2012-09-16 ENCOUNTER — Encounter (HOSPITAL_COMMUNITY): Payer: Self-pay | Admitting: Emergency Medicine

## 2012-09-16 DIAGNOSIS — Z8742 Personal history of other diseases of the female genital tract: Secondary | ICD-10-CM | POA: Insufficient documentation

## 2012-09-16 DIAGNOSIS — R071 Chest pain on breathing: Secondary | ICD-10-CM | POA: Insufficient documentation

## 2012-09-16 DIAGNOSIS — Z79899 Other long term (current) drug therapy: Secondary | ICD-10-CM | POA: Insufficient documentation

## 2012-09-16 DIAGNOSIS — R110 Nausea: Secondary | ICD-10-CM | POA: Insufficient documentation

## 2012-09-16 DIAGNOSIS — R5383 Other fatigue: Secondary | ICD-10-CM | POA: Insufficient documentation

## 2012-09-16 DIAGNOSIS — Z862 Personal history of diseases of the blood and blood-forming organs and certain disorders involving the immune mechanism: Secondary | ICD-10-CM | POA: Insufficient documentation

## 2012-09-16 DIAGNOSIS — K219 Gastro-esophageal reflux disease without esophagitis: Secondary | ICD-10-CM | POA: Insufficient documentation

## 2012-09-16 DIAGNOSIS — R52 Pain, unspecified: Secondary | ICD-10-CM | POA: Insufficient documentation

## 2012-09-16 DIAGNOSIS — R209 Unspecified disturbances of skin sensation: Secondary | ICD-10-CM | POA: Insufficient documentation

## 2012-09-16 DIAGNOSIS — L989 Disorder of the skin and subcutaneous tissue, unspecified: Secondary | ICD-10-CM | POA: Insufficient documentation

## 2012-09-16 DIAGNOSIS — R21 Rash and other nonspecific skin eruption: Secondary | ICD-10-CM | POA: Insufficient documentation

## 2012-09-16 DIAGNOSIS — R5381 Other malaise: Secondary | ICD-10-CM | POA: Insufficient documentation

## 2012-09-16 DIAGNOSIS — R0789 Other chest pain: Secondary | ICD-10-CM

## 2012-09-16 LAB — CK: Total CK: 175 U/L (ref 7–177)

## 2012-09-16 LAB — CBC
HCT: 32.4 % — ABNORMAL LOW (ref 36.0–46.0)
Hemoglobin: 10.4 g/dL — ABNORMAL LOW (ref 12.0–15.0)
MCH: 24.1 pg — ABNORMAL LOW (ref 26.0–34.0)
MCHC: 32.1 g/dL (ref 30.0–36.0)
MCV: 75 fL — ABNORMAL LOW (ref 78.0–100.0)
Platelets: 371 10*3/uL (ref 150–400)
RBC: 4.32 MIL/uL (ref 3.87–5.11)
RDW: 13.9 % (ref 11.5–15.5)
WBC: 10.8 10*3/uL — ABNORMAL HIGH (ref 4.0–10.5)

## 2012-09-16 LAB — BASIC METABOLIC PANEL
BUN: 7 mg/dL (ref 6–23)
CO2: 28 mEq/L (ref 19–32)
Calcium: 9.4 mg/dL (ref 8.4–10.5)
Chloride: 102 mEq/L (ref 96–112)
Creatinine, Ser: 0.75 mg/dL (ref 0.50–1.10)
GFR calc Af Amer: >90 mL/min (ref >90)
GFR calc non Af Amer: >90 mL/min (ref >90)
Glucose, Bld: 96 mg/dL (ref 70–99)
Potassium: 3.8 mEq/L (ref 3.5–5.1)
Sodium: 137 mEq/L (ref 135–145)

## 2012-09-16 LAB — PRO B NATRIURETIC PEPTIDE: Pro B Natriuretic peptide (BNP): 19.6 pg/mL (ref 0–125)

## 2012-09-16 LAB — SEDIMENTATION RATE: Sed Rate: 17 mm/hr (ref 0–22)

## 2012-09-16 MED ORDER — NAPROXEN SODIUM 220 MG PO TABS: ORAL_TABLET | ORAL | Status: DC

## 2012-09-16 MED ORDER — KETOROLAC TROMETHAMINE 30 MG/ML IJ SOLN
INTRAMUSCULAR | Status: AC
  Administered 2012-09-16: 03:00:00
  Filled 2012-09-16: qty 1

## 2012-09-16 MED ORDER — FENTANYL CITRATE 0.05 MG/ML IJ SOLN
100.0000 ug | Freq: Once | INTRAMUSCULAR | Status: AC
  Administered 2012-09-16: 100 ug via INTRAVENOUS
  Filled 2012-09-16: qty 2

## 2012-09-16 MED ORDER — SODIUM CHLORIDE 0.9 % IV SOLN
INTRAVENOUS | Status: DC
  Administered 2012-09-16: 03:00:00 via INTRAVENOUS

## 2012-09-16 MED ORDER — KETOROLAC TROMETHAMINE 30 MG/ML IJ SOLN: 30.0000 mg | Freq: Once | INTRAMUSCULAR | Status: DC

## 2012-09-16 MED ORDER — ASPIRIN 325 MG PO TABS
325.0000 mg | ORAL_TABLET | ORAL | Status: AC
  Administered 2012-09-16: 325 mg via ORAL
  Filled 2012-09-16: qty 1

## 2012-09-16 MED ORDER — OXYCODONE-ACETAMINOPHEN 5-325 MG PO TABS: 1.0000 | ORAL_TABLET | Freq: Four times a day (QID) | ORAL | Status: DC | PRN

## 2012-09-16 MED ORDER — NITROGLYCERIN 0.4 MG SL SUBL: 0.4000 mg | SUBLINGUAL_TABLET | SUBLINGUAL | Status: DC | PRN

## 2012-09-16 NOTE — ED Provider Notes (Addendum)
See downtime documentation.  Nursing notes and vitals signs, including pulse oximetry, reviewed.  Summary of this visit's results, reviewed by myself:  Labs:  Results for orders placed during the hospital encounter of 09/16/12 (from the past 24 hour(s))  CBC     Status: Abnormal   Collection Time    09/16/12  1:05 AM      Result Value Range   WBC 10.8 (*) 4.0 - 10.5 K/uL   RBC 4.32  3.87 - 5.11 MIL/uL   Hemoglobin 10.4 (*) 12.0 - 15.0 g/dL   HCT 16.1 (*) 09.6 - 04.5 %   MCV 75.0 (*) 78.0 - 100.0 fL   MCH 24.1 (*) 26.0 - 34.0 pg   MCHC 32.1  30.0 - 36.0 g/dL   RDW 40.9  81.1 - 91.4 %   Platelets 371  150 - 400 K/uL  BASIC METABOLIC PANEL     Status: None   Collection Time    09/16/12  1:05 AM      Result Value Range   Sodium 137  135 - 145 mEq/L   Potassium 3.8  3.5 - 5.1 mEq/L   Chloride 102  96 - 112 mEq/L   CO2 28  19 - 32 mEq/L   Glucose, Bld 96  70 - 99 mg/dL   BUN 7  6 - 23 mg/dL   Creatinine, Ser 7.82  0.50 - 1.10 mg/dL   Calcium 9.4  8.4 - 95.6 mg/dL   GFR calc non Af Amer >90  >90 mL/min   GFR calc Af Amer >90  >90 mL/min  PRO B NATRIURETIC PEPTIDE     Status: None   Collection Time    09/16/12  1:05 AM      Result Value Range   Pro B Natriuretic peptide (BNP) 19.6  0 - 125 pg/mL  CK     Status: None   Collection Time    09/16/12  2:20 AM      Result Value Range   Total CK 175  7 - 177 U/L  SEDIMENTATION RATE     Status: None   Collection Time    09/16/12  2:20 AM      Result Value Range   Sed Rate 17  0 - 22 mm/hr   5:48 AM Patient has remained stable in the ED. She continues to have reproducible chest wall pain. There was only minimal relief with IV Toradol and fentanyl. The patient's nebulous symptoms are concerning for a rheumatological disorder. In fact her primary care physician is attempting to have her followed up with a rheumatologist. She states she keeps coming to the ED because she has not been able to see the rheumatologist. It was explained  that rheumatologic conditions are difficult to workup in the ED and that she should be persistent in her terms of followup. She states the Norco was not treating her pain we will change her to oxycodone.   Hanley Seamen, MD 09/16/12 2130  Hanley Seamen, MD 09/16/12 [phone]

## 2012-09-16 NOTE — ED Provider Notes (Signed)
Medical screening examination/treatment/procedure(s) were conducted as a shared visit with non-physician practitioner(s) and myself.  I personally evaluated the patient during the encounter.  37yF with possible mild allergic reaction. No objective findings on my exam. No facial/oral swelling. No rash. No stridor. Lungs clear. No increased wob. Abdomen benign. I feel safe for DC at this time. Return precautions discussed.   Raeford Razor, MD 09/16/12 [phone]

## 2012-09-16 NOTE — ED Notes (Signed)
PT. REPROTS INTERMITTENT LEFT SIDE CHEST PAIN SINCE April 2014 WITH SOB AND NAUSEA . RATES PAIN 8/10 AT ARRIVAL . SLIGHT DIAPHORESIS.

## 2012-11-09 ENCOUNTER — Emergency Department (HOSPITAL_COMMUNITY)
Admission: EM | Admit: 2012-11-09 | Discharge: 2012-11-10 | Disposition: A | Discharge status: Home / Self Care | Payer: Medicaid Other | Attending: Emergency Medicine | Admitting: Emergency Medicine

## 2012-11-09 ENCOUNTER — Encounter (HOSPITAL_COMMUNITY): Payer: Self-pay | Admitting: Emergency Medicine

## 2012-11-09 DIAGNOSIS — Y939 Activity, unspecified: Secondary | ICD-10-CM | POA: Insufficient documentation

## 2012-11-09 DIAGNOSIS — R070 Pain in throat: Secondary | ICD-10-CM | POA: Insufficient documentation

## 2012-11-09 DIAGNOSIS — Z8742 Personal history of other diseases of the female genital tract: Secondary | ICD-10-CM | POA: Insufficient documentation

## 2012-11-09 DIAGNOSIS — J029 Acute pharyngitis, unspecified: Secondary | ICD-10-CM

## 2012-11-09 DIAGNOSIS — Z79899 Other long term (current) drug therapy: Secondary | ICD-10-CM | POA: Insufficient documentation

## 2012-11-09 DIAGNOSIS — Y929 Unspecified place or not applicable: Secondary | ICD-10-CM | POA: Insufficient documentation

## 2012-11-09 DIAGNOSIS — K219 Gastro-esophageal reflux disease without esophagitis: Secondary | ICD-10-CM | POA: Insufficient documentation

## 2012-11-09 DIAGNOSIS — D649 Anemia, unspecified: Secondary | ICD-10-CM | POA: Insufficient documentation

## 2012-11-09 DIAGNOSIS — R21 Rash and other nonspecific skin eruption: Secondary | ICD-10-CM

## 2012-11-09 DIAGNOSIS — R51 Headache: Secondary | ICD-10-CM | POA: Insufficient documentation

## 2012-11-09 DIAGNOSIS — K5900 Constipation, unspecified: Secondary | ICD-10-CM | POA: Insufficient documentation

## 2012-11-09 DIAGNOSIS — R519 Headache, unspecified: Secondary | ICD-10-CM

## 2012-11-09 LAB — RAPID STREP SCREEN (MED CTR MEBANE ONLY): Streptococcus, Group A Screen (Direct): NEGATIVE

## 2012-11-09 MED ORDER — DEXAMETHASONE SODIUM PHOSPHATE 10 MG/ML IJ SOLN
10.0000 mg | Freq: Once | INTRAMUSCULAR | Status: AC
  Administered 2012-11-09: 10 mg via INTRAMUSCULAR
  Filled 2012-11-09: qty 1

## 2012-11-09 MED ORDER — KETOROLAC TROMETHAMINE 30 MG/ML IJ SOLN
30.0000 mg | Freq: Once | INTRAMUSCULAR | Status: AC
  Administered 2012-11-09: 30 mg via INTRAMUSCULAR
  Filled 2012-11-09: qty 1

## 2012-11-09 NOTE — ED Provider Notes (Signed)
History    CSN: [id number] Arrival date & time 11/09/12  2224  First MD Initiated Contact with Patient 11/09/12 2311     Chief Complaint  Patient presents with  . Insect Bite   HPI  History provided by the patient. The patient is a 37 year old female presenting with complaints of sore throat, hoarse voice, headache and. Rash right neck. Symptoms first began with coarse voice and sore throat 3 days ago. Since that time voice has improved patient continues to have slight pain in the throat with occasional coughing. She denies any postnasal drip, nasal congestion or rhinorrhea. Today she also noticed a small pruritic lesion to the right neck. She has scratched at the area but not using treatments. Denies any similar rash anywhere else. No new lotions, perfumes or jewelry to the area. Denies similar symptoms previously. Patient also complains of general throbbing headache that has been waxing and waning for the past 2 days. She has taken Tylenol without any improvement. Denies any neck pain or stiffness. No fever, chills or sweats. No nausea or vomiting. Normal appetite. No recent foreign travel. 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    Past Medical History  Diagnosis Date  . GERD (gastroesophageal reflux disease)   . Anemia   . Constipation   . Hx of endometriosis   . Macromastia 07/2011   Past Surgical History  Procedure Laterality Date  . Salpingectomy  12/12/2003    right; with TAH  . Cesarean section  12/29/2000; 1994  . Bladder neck reconstruction  01/14/2011    Procedure: BLADDER NECK REPAIR;  Surgeon: Reva Bores, MD;  Location: [facility];  Service: Gynecology;  Laterality: N/A;  Laparoscopic Repair of Incidental Cystotomy  . Ankle arthroscopy  12/29/2007    right; with extensive debridement  . Abdominal hysterectomy  12/12/2003  . Tubal ligation  12/29/2000  . Dilation and curettage of uterus  07/08/2003    open laparoscopy  . Repair peroneal tendons ankle   01/03/2010    repair right subluxing peroneal tendons  . Tonsillectomy  as a child  . Adenoidectomy  05/2011  . Laparoscopic salpingoopherectomy  01/14/2011    left  . Right oophorectomy      with lysis of adhesions  . Breast reduction surgery  08/05/2011    Procedure: MAMMARY REDUCTION  (BREAST);  Surgeon: Louisa Second, MD;  Location: [facility];  Service: Plastics;  Laterality: Bilateral;  bilateral   Family History  Problem Relation Age of Onset  . Diabetes Mother   . Hypertension Mother   . Thyroid disease Sister    History  Substance Use Topics  . Smoking status: Never Smoker   . Smokeless tobacco: Never Used  . Alcohol Use: No   OB History   Grav Para Term Preterm Abortions TAB SAB Ect Mult Living   2 2 2       2      Review of Systems  Constitutional: Negative for fever and diaphoresis.  HENT: Positive for sore throat. Negative for congestion, rhinorrhea, neck pain and neck stiffness.   Respiratory: Negative for shortness of breath.   Cardiovascular: Negative for chest pain.  Gastrointestinal: Negative for nausea, vomiting, abdominal pain and diarrhea.  Skin: Positive for rash.  All other systems reviewed and are negative.    Allergies  Review of patient's allergies indicates no known allergies.  Home Medications   Current Outpatient Rx  Name  Route  Sig  Dispense  Refill  .  acetaminophen (TYLENOL) 500 MG tablet   Oral   Take 1,000 mg by mouth every 6 (six) hours as needed for pain. Pain         . DM-Doxylamine-Acetaminophen (NYQUIL COLD & FLU PO)   Oral   Take 15 mLs by mouth at bedtime as needed (for cold).         [name] docusate sodium (COLACE) 100 MG capsule   Oral   Take 100 mg by mouth 2 (two) times daily.         [name] esomeprazole (NEXIUM) 40 MG capsule   Oral   Take 40 mg by mouth every morning.          . Iron-Folic Acid-B12-C-Docusate (FERRAPLUS 90) 90-1 MG TABS   Oral   Take 1 tablet by mouth daily.         [name]  loratadine (CLARITIN) 10 MG tablet   Oral   Take 10 mg by mouth every morning.         . Multiple Vitamin (MULTIVITAMIN WITH MINERALS) TABS   Oral   Take 1 tablet by mouth every other day.         . polyethylene glycol (MIRALAX / GLYCOLAX) packet   Oral   Take 17 g by mouth daily as needed (constipation).          . Probiotic Product (PROBIOTIC DAILY PO)   Oral   Take 1 tablet by mouth daily.         . Soft Lens Products (REWETTING DROPS) SOLN   Both Eyes   Place 2 drops into both eyes daily as needed (FOR DRY EYES).         [name] triamcinolone cream (KENALOG) 0.1 %   Topical   Apply 1 application topically daily as needed (for rash).         . zolpidem (AMBIEN CR) 6.25 MG CR tablet   Oral   Take 6.25 mg by mouth at bedtime as needed for sleep.         [name] EPINEPHrine (EPIPEN 2-PAK) 0.3 mg/0.3 mL DEVI   Intramuscular   Inject 0.3 mLs (0.3 mg total) into the muscle once as needed (for severe allergic reaction). CAll 911 immediately if you have to use this medicine   1 Device   1    BP 119/66  Pulse 100  Temp(Src) 98.6 F (37 C) (Oral)  Resp 14  SpO2 98%  LMP 12/27/2003 Physical Exam  Nursing note and vitals reviewed. Constitutional: She is oriented to person, place, and time. She appears well-developed and well-nourished. No distress.  HENT:  Head: Normocephalic.  Mild erythema the pharynx. Slight cobblestoning. Tonsils normal without exudate. Uvula midline.  No petechia or oral lesions  Eyes: Conjunctivae are normal.  Neck: Normal range of motion. Neck supple.  Cardiovascular: Normal rate and regular rhythm.   Pulmonary/Chest: Effort normal and breath sounds normal. No respiratory distress. She has no wheezes. She has no rales.  Abdominal: Soft.  Musculoskeletal: Normal range of motion.  Lymphadenopathy:    She has no cervical adenopathy.  Neurological: She is alert and oriented to person, place, and time.  Skin: Skin is warm and dry.  Single small  erythematous papular lesion with dry scaling skin to the right neck. No induration. No other lesions.  Psychiatric: She has a normal mood and affect. Her behavior is normal.    ED Course  Procedures   Results for orders placed during the hospital encounter of 11/09/12  RAPID STREP  SCREEN      Result Value Range   Streptococcus, Group A Screen (Direct) NEGATIVE  NEGATIVE     1. Sore throat   2. GERD (gastroesophageal reflux disease)   3. Headache   4. Rash      MDM  11:10 PM patient seen and evaluated. Patient appears well no acute distress. Does not appear severely ill or toxic.  Angus Seller, PA-C 11/10/12 0008

## 2012-11-09 NOTE — ED Notes (Signed)
PT. REPORTS ITCHY  INSECT BITE AT RIGHT NECK LAST Saturday . SMALL PAPULE NOTED WITH NO DRAINAGE. ALSO REPORTS HOARSE VOICE.

## 2012-11-10 NOTE — ED Provider Notes (Signed)
Medical screening examination/treatment/procedure(s) were performed by non-physician practitioner and as supervising physician I was immediately available for consultation/collaboration.   David H Yao, MD 11/10/12 0605 

## 2012-11-10 NOTE — ED Notes (Signed)
Pt denies any pain or questions upon discharge. 

## 2012-11-11 LAB — CULTURE, GROUP A STREP

## 2012-12-14 ENCOUNTER — Emergency Department (HOSPITAL_COMMUNITY)
Admission: EM | Admit: 2012-12-14 | Discharge: 2012-12-14 | Disposition: A | Discharge status: Home / Self Care | Payer: Medicaid Other | Attending: Emergency Medicine | Admitting: Emergency Medicine

## 2012-12-14 ENCOUNTER — Encounter (HOSPITAL_COMMUNITY): Payer: Self-pay | Admitting: Emergency Medicine

## 2012-12-14 DIAGNOSIS — K219 Gastro-esophageal reflux disease without esophagitis: Secondary | ICD-10-CM | POA: Insufficient documentation

## 2012-12-14 DIAGNOSIS — G8929 Other chronic pain: Secondary | ICD-10-CM | POA: Insufficient documentation

## 2012-12-14 DIAGNOSIS — K5900 Constipation, unspecified: Secondary | ICD-10-CM | POA: Insufficient documentation

## 2012-12-14 DIAGNOSIS — D649 Anemia, unspecified: Secondary | ICD-10-CM | POA: Insufficient documentation

## 2012-12-14 DIAGNOSIS — M79609 Pain in unspecified limb: Secondary | ICD-10-CM | POA: Insufficient documentation

## 2012-12-14 DIAGNOSIS — M25519 Pain in unspecified shoulder: Secondary | ICD-10-CM | POA: Insufficient documentation

## 2012-12-14 DIAGNOSIS — Z79899 Other long term (current) drug therapy: Secondary | ICD-10-CM | POA: Insufficient documentation

## 2012-12-14 DIAGNOSIS — Z8742 Personal history of other diseases of the female genital tract: Secondary | ICD-10-CM | POA: Insufficient documentation

## 2012-12-14 MED ORDER — METHOCARBAMOL 500 MG PO TABS: 500.0000 mg | ORAL_TABLET | Freq: Two times a day (BID) | ORAL | Status: DC

## 2012-12-14 MED ORDER — KETOROLAC TROMETHAMINE 30 MG/ML IJ SOLN
30.0000 mg | Freq: Once | INTRAMUSCULAR | Status: AC
  Administered 2012-12-14: 30 mg via INTRAMUSCULAR
  Filled 2012-12-14: qty 1

## 2012-12-14 MED ORDER — OXYCODONE-ACETAMINOPHEN 5-325 MG PO TABS: 1.0000 | ORAL_TABLET | ORAL | Status: DC | PRN

## 2012-12-14 NOTE — ED Notes (Signed)
Pt c/o rt shoulder/arm pain for past 6months, denies injury

## 2012-12-14 NOTE — ED Notes (Signed)
Pt ambulatory to exam room with steady gait. Pt arrives with family member.  

## 2012-12-14 NOTE — ED Provider Notes (Signed)
CSN: [id number]     Arrival date & time 12/14/12  1740 History  This chart was scribed for non-physician practitioner working with Suzi Roots, MD, by Ardelia Mems ED Scribe. This patient was seen in room WTR7/WTR7 and the patient's care was started at 8:59 PM.    Chief Complaint  Patient presents with  . Shoulder Pain  . Arm Pain    The history is provided by the patient. No language interpreter was used.   HPI Comments: [name] is a 37 y.o. female with a history of anemia who presents to the Emergency Department complaining of gradual onset, gradually worsening, intermittent, moderate right shoulder, right arm and right hand pain over the past 6 weeks. She denies any injury to the arm. She states that she has been seen by Dr. Luiz Blare of [facility] who suspected a tendonitis of her right elbow. She states that as her pain began in her right arm and as it has become more severe, it has also began radiating to her right shoulder. She states that she has been given Vicodin and Flexeril by Dr. Luiz Blare which she has taken with only mild relief of pain. She also reports using heat and ice without relief. She states that she has had multiple steroid injections in the right arm and shoulder without relief. She states that her pain is worsened with ROM of her shoulder, sleeping and driving. She denies any numbness, weakness, tingling or swelling to the right shoulder, arm or hand. She denies any family history of MS and states that her nerves have been tested normal. She states that she has a history of carpal tunnel syndrome in the right arm, for which she has had surgery. Pt denies fever, chills, sweats, back pain, neck pain or any other symptoms.  She denies any history of smoking and denies alcohol use.   Orthopedist- Dr. Luiz Blare at [facility] PCP- Dr. Dorothyann Peng   Past Medical History  Diagnosis Date  . GERD (gastroesophageal reflux disease)   . Anemia   .  Constipation   . Hx of endometriosis   . Macromastia 07/2011   Past Surgical History  Procedure Laterality Date  . Salpingectomy  12/12/2003    right; with TAH  . Cesarean section  12/29/2000; 1994  . Bladder neck reconstruction  01/14/2011    Procedure: BLADDER NECK REPAIR;  Surgeon: Reva Bores, MD;  Location: [facility];  Service: Gynecology;  Laterality: N/A;  Laparoscopic Repair of Incidental Cystotomy  . Ankle arthroscopy  12/29/2007    right; with extensive debridement  . Abdominal hysterectomy  12/12/2003  . Tubal ligation  12/29/2000  . Dilation and curettage of uterus  07/08/2003    open laparoscopy  . Repair peroneal tendons ankle  01/03/2010    repair right subluxing peroneal tendons  . Tonsillectomy  as a child  . Adenoidectomy  05/2011  . Laparoscopic salpingoopherectomy  01/14/2011    left  . Right oophorectomy      with lysis of adhesions  . Breast reduction surgery  08/05/2011    Procedure: MAMMARY REDUCTION  (BREAST);  Surgeon: Louisa Second, MD;  Location: [facility];  Service: Plastics;  Laterality: Bilateral;  bilateral   Family History  Problem Relation Age of Onset  . Diabetes Mother   . Hypertension Mother   . Thyroid disease Sister    History  Substance Use Topics  . Smoking status: Never Smoker   . Smokeless tobacco: Never Used  .  Alcohol Use: No   OB History   Grav Para Term Preterm Abortions TAB SAB Ect Mult Living   2 2 2       2      Review of Systems  Constitutional: Negative for fever and chills.  HENT: Negative for neck pain.   Musculoskeletal: Negative for back pain.       Right shoulder, arm and hand pain.  Neurological: Negative for weakness and numbness.  All other systems reviewed and are negative.   Allergies  Review of patient's allergies indicates no known allergies.  Home Medications   Current Outpatient Rx  Name  Route  Sig  Dispense  Refill  . docusate sodium (COLACE) 100 MG capsule   Oral   Take 100 mg by mouth  2 (two) times daily.         [name] esomeprazole (NEXIUM) 40 MG capsule   Oral   Take 40 mg by mouth every morning.          [name] HYDROcodone-acetaminophen (NORCO/VICODIN) 5-325 MG per tablet   Oral   Take 2 tablets by mouth every 8 (eight) hours as needed for pain.         [name] ibuprofen (ADVIL,MOTRIN) 200 MG tablet   Oral   Take 600 mg by mouth every 6 (six) hours as needed for pain.         . Iron-Folic Acid-B12-C-Docusate (FERRAPLUS 90) 90-1 MG TABS   Oral   Take 1 tablet by mouth daily as needed (for anemia).          . loratadine (CLARITIN) 10 MG tablet   Oral   Take 10 mg by mouth every morning.         . Multiple Vitamin (MULTIVITAMIN WITH MINERALS) TABS   Oral   Take 1 tablet by mouth every other day.         . NON FORMULARY   Intramuscular   Inject 1 each into the muscle once a week. "HCG" hormone injection, taken on fridays         . Olopatadine HCl (PATADAY) 0.2 % SOLN   Ophthalmic   Apply 1 drop to eye daily.         . phentermine 37.5 MG capsule   Oral   Take 37.5 mg by mouth every morning.         . polyethylene glycol (MIRALAX / GLYCOLAX) packet   Oral   Take 17 g by mouth daily as needed (constipation).          . Probiotic Product (PROBIOTIC DAILY PO)   Oral   Take 1 tablet by mouth daily.         . Soft Lens Products (REWETTING DROPS) SOLN   Both Eyes   Place 2 drops into both eyes daily as needed (FOR DRY EYES).         [name] triamcinolone cream (KENALOG) 0.1 %   Topical   Apply 1 application topically daily as needed (for rash).         . zolpidem (AMBIEN CR) 6.25 MG CR tablet   Oral   Take 6.25 mg by mouth at bedtime as needed for sleep.         [name] EPINEPHrine (EPIPEN 2-PAK) 0.3 mg/0.3 mL DEVI   Intramuscular   Inject 0.3 mLs (0.3 mg total) into the muscle once as needed (for severe allergic reaction). CAll 911 immediately if you have to use this medicine   1 Device   1  Triage Vitals: BP 123/86  Pulse 96   Temp(Src) 99.1 F (37.3 C) (Oral)  Resp 20  SpO2 100%  LMP 12/27/2003  Physical Exam  Nursing note and vitals reviewed. Constitutional: She is oriented to person, place, and time. She appears well-developed and well-nourished. No distress.  HENT:  Head: Normocephalic and atraumatic.  Eyes: EOM are normal.  Neck: Normal range of motion. Neck supple. No tracheal deviation present.  No cervical spinous tenderness.  Tenderness over the right trapezius area without spasm or deformity.  Cardiovascular: Normal rate.   Pulmonary/Chest: Effort normal. No respiratory distress.  Musculoskeletal: She exhibits no edema.  Reduced ROM to the right shoulder, secondary to pain. There is also reduced ROM at the right elbow, greatest with extension, secondary to pain. No gross deformities.   Hand- Normal grip strength. Normal sensation to light touch. Capilllary refill less than 2 seconds.   Neurological: She is alert and oriented to person, place, and time.  Skin: Skin is warm and dry.  Psychiatric: She has a normal mood and affect. Her behavior is normal.    ED Course   Medications  ketorolac (TORADOL) 30 MG/ML injection 30 mg (not administered)   Procedures   DIAGNOSTIC STUDIES: Oxygen Saturation is 100% on RA, normal by my interpretation.    COORDINATION OF CARE: 9:26 PM- Pt advised of plan to receive a Toradol in the ED for her pain and pt agrees.    1. Chronic arm pain     MDM  Patient seen and evaluated. Patient appears well no acute distress. She does not appear in significant discomfort. Symptoms have been ongoing for the past one to 2 months. She is followed by orthopedic specialist with evaluation soft for this. No new or changing symptoms. No concerning or red flag symptoms.     I personally performed the services described in this documentation, which was scribed in my presence. The recorded information has been reviewed and is accurate.    Angus Seller,  PA-C 12/14/12 2132

## 2012-12-15 NOTE — ED Provider Notes (Signed)
Medical screening examination/treatment/procedure(s) were performed by non-physician practitioner and as supervising physician I was immediately available for consultation/collaboration.   Lailyn Appelbaum E Shakiara Lukic, MD 12/15/12 2135 

## 2013-02-02 ENCOUNTER — Encounter: Payer: Self-pay | Admitting: Physical Medicine & Rehabilitation

## 2013-03-02 ENCOUNTER — Encounter: Payer: Self-pay | Admitting: Physical Medicine & Rehabilitation

## 2013-03-02 ENCOUNTER — Ambulatory Visit (HOSPITAL_BASED_OUTPATIENT_CLINIC_OR_DEPARTMENT_OTHER): Payer: Medicaid Other | Admitting: Physical Medicine & Rehabilitation

## 2013-03-02 ENCOUNTER — Encounter: Payer: Medicaid Other | Attending: Physical Medicine & Rehabilitation

## 2013-03-02 VITALS — BP 112/61 | HR 70 | Resp 14 | Ht 65.0 in | Wt 210.0 lb

## 2013-03-02 DIAGNOSIS — M797 Fibromyalgia: Secondary | ICD-10-CM

## 2013-03-02 DIAGNOSIS — M79609 Pain in unspecified limb: Secondary | ICD-10-CM

## 2013-03-02 DIAGNOSIS — IMO0001 Reserved for inherently not codable concepts without codable children: Secondary | ICD-10-CM

## 2013-03-02 DIAGNOSIS — Z79899 Other long term (current) drug therapy: Secondary | ICD-10-CM

## 2013-03-02 DIAGNOSIS — Z5181 Encounter for therapeutic drug level monitoring: Secondary | ICD-10-CM

## 2013-03-02 DIAGNOSIS — R209 Unspecified disturbances of skin sensation: Secondary | ICD-10-CM | POA: Insufficient documentation

## 2013-03-02 MED ORDER — PREGABALIN 50 MG PO CAPS: 50.0000 mg | ORAL_CAPSULE | Freq: Two times a day (BID) | ORAL | Status: DC

## 2013-03-02 NOTE — Progress Notes (Signed)
Subjective:    Patient ID: [name], female    DOB: [birth year], 37 y.o.   MRN: [id number] CC: Pain all over body HPI 37 year old feel female with a 10 month history of widespread body pain. Has seen her primary care physician as well as multiple ER visits. Was eventually referred to rheumatologist to diagnose fibromyalgia syndrome. In addition she has had right shoulder pain and season orthopedist for this. She is having arthroscopic surgery on the right shoulder tomorrow.  Has had multiple ER visits for varying complaints including sinusitis, bronchitis, & pelvic pain. Past medical history also significant for irritable bowel syndrome as well as gastroparesis  May have tried Cymbalta in the past but this was prior to diagnosis with fibromyalgia. Tried gabapentin 300 mg in the past but never went up to 600 mg. Has never tried Lyrica. Get some partial relief from tramadol 50 mg Pain Inventory Average Pain 9 Pain Right Now 7 My pain is sharp, burning, dull and aching  In the last 24 hours, has pain interfered with the following? General activity 7 Relation with others 7 Enjoyment of life 7 What TIME of day is your pain at its worst? morning, evening, night Sleep (in general) Poor  Pain is worse with: walking, bending, sitting, inactivity, standing and some activites Pain improves with: rest and heat/ice Relief from Meds: 4  Mobility walk without assistance ability to climb steps?  yes do you drive?  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  no  Physicians involved in your care Any changes since last visit?  no   Family History  Problem Relation Age of Onset  . Diabetes Mother   . Hypertension Mother   . Thyroid disease Sister    History   Social History  . Marital Status: Divorced    Spouse Name: N/A    Number of Children: N/A  .  Years of Education: N/A   Social History Main Topics  . Smoking status: Never Smoker   . Smokeless tobacco: Never Used  . Alcohol Use: No  . Drug Use: No  . Sexual Activity: None   Other Topics Concern  . None   Social History Narrative  . None   Past Surgical History  Procedure Laterality Date  . Salpingectomy  12/12/2003    right; with TAH  . Cesarean section  12/29/2000; 1994  . Bladder neck reconstruction  01/14/2011    Procedure: BLADDER NECK REPAIR;  Surgeon: Reva Bores, MD;  Location: [facility];  Service: Gynecology;  Laterality: N/A;  Laparoscopic Repair of Incidental Cystotomy  . Ankle arthroscopy  12/29/2007    right; with extensive debridement  . Abdominal hysterectomy  12/12/2003  . Tubal ligation  12/29/2000  . Dilation and curettage of uterus  07/08/2003    open laparoscopy  . Repair peroneal tendons ankle  01/03/2010    repair right subluxing peroneal tendons  . Tonsillectomy  as a child  . Adenoidectomy  05/2011  . Laparoscopic salpingoopherectomy  01/14/2011    left  . Right oophorectomy      with lysis of adhesions  . Breast reduction surgery  08/05/2011    Procedure: MAMMARY REDUCTION  (BREAST);  Surgeon: Louisa Second, MD;  Location: [facility];  Service: Plastics;  Laterality: Bilateral;  bilateral   Past Medical History  Diagnosis Date  . GERD (gastroesophageal reflux disease)   .  Anemia   . Constipation   . Hx of endometriosis   . Macromastia 07/2011   BP 112/61  Pulse 70  Resp 14  Ht 5\' 5"  (1.651 m)  Wt 210 lb (95.255 kg)  BMI 34.95 kg/m2  SpO2 100%  LMP 12/27/2003      Review of Systems  Gastrointestinal: Positive for nausea, abdominal pain and constipation.  Genitourinary:       Bladder control problems  Psychiatric/Behavioral: The patient is nervous/anxious.        Objective:   Physical Exam  11/18 fibromyalgia tender points positive. Positive at right elbow, bilateral knees, bilateral trapezius, bilateral low back,  bilateral gluteus, bilateral hip Neuro:  Eyes without evidence of nystagmus  Tone is normal without evidence of spasticity Cerebellar exam shows no evidence of ataxia on finger nose finger or heel to shin testing No evidence of trunkal ataxia  Motor strength is 5/5 in bilateral deltoid, biceps, triceps, finger flexors and extensors, wrist flexors and extensors, hip flexors, knee flexors and extensors, ankle dorsiflexors, plantar flexors, invertors and evertors, toe flexors and extensors  Sensory exam is normal to pinprick, proprioception and light touch in the upper and lower limbs   Cranial nerves II- Visual fields are intact to confrontation testing, no blurring of vision III- no evidence of ptosis, upward, downward and medial gaze intact IV- no vertical diplopia or head tilt V- no facial numbness or masseter weakness VI- no pupil abduction weakness VII- no facial droop, good lid closure VII- normal auditory acuity IX- no pharygeal weakness X- no pharyngeal weakness, no hoarseness XI- no trap or SCM weakness XII- no glossal weakness        Assessment & Plan:  1.  Fibromyalgia syndrome.  This accounts for majority of her pain complaints including parasthesias.  We discussed the diagnosis and treatment options.  I stressed the importance of regular low/moderate intensity exercise. Will start waking 3 times daily, will work up to 60 min.  If poorly tolerated consider pool vs stationary bike.  Discussed that Tramadol is a good analgesic med for fibro but stronger narcotics are not recommended  Trial lyrica 50mg  BID but will likely need to titrate upward  2.  R shoulder pain, scheduled for arthroscopic shoulder surgery tomorrow.  Dr Luiz Blare to manage post op pain meds.

## 2013-03-02 NOTE — Patient Instructions (Addendum)
Fibromyalgia Fibromyalgia is a disorder that is often misunderstood. It is associated with muscular pains and tenderness that comes and goes. It is often associated with fatigue and sleep disturbances. Though it tends to be long-lasting, fibromyalgia is not life-threatening. CAUSES  The exact cause of fibromyalgia is unknown. People with certain gene types are predisposed to developing fibromyalgia and other conditions. Certain factors can play a role as triggers, such as:  Spine disorders.  Arthritis.  Severe injury (trauma) and other physical stressors.  Emotional stressors. SYMPTOMS   The main symptom is pain and stiffness in the muscles and joints, which can vary over time.  Sleep and fatigue problems. Other related symptoms may include:  Bowel and bladder problems.  Headaches.  Visual problems.  Problems with odors and noises.  Depression or mood changes.  Painful periods (dysmenorrhea).  Dryness of the skin or eyes. DIAGNOSIS  There are no specific tests for diagnosing fibromyalgia. Patients can be diagnosed accurately from the specific symptoms they have. The diagnosis is made by determining that nothing else is causing the problems. TREATMENT  There is no cure. Management includes medicines and an active, healthy lifestyle. The goal is to enhance physical fitness, decrease pain, and improve sleep. HOME CARE INSTRUCTIONS   Only take over-the-counter or prescription medicines as directed by your caregiver. Sleeping pills, tranquilizers, and pain medicines may make your problems worse.  Low-impact aerobic exercise is very important and advised for treatment. At first, it may seem to make pain worse. Gradually increasing your tolerance will overcome this feeling.  WALK 3 Times a day  Learning relaxation techniques and how to control stress will help you. Biofeedback, visual imagery, hypnosis, muscle relaxation, yoga, and meditation are all  options.  Anti-inflammatory medicines and physical therapy may provide short-term help.  Acupuncture or massage treatments may help.  Take muscle relaxant medicines as suggested by your caregiver.  Avoid stressful situations.  Plan a healthy lifestyle. This includes your diet, sleep, rest, exercise, and friends.  Find and practice a hobby you enjoy.  Join a fibromyalgia support group for interaction, ideas, and sharing advice. This may be helpful. SEEK MEDICAL CARE IF:  You are not having good results or improvement from your treatment. FOR MORE INFORMATION  National Fibromyalgia Association: www.fmaware.org Arthritis Foundation: www.arthritis.org Document Released: 04/22/2005 Document Revised: 07/15/2011 Document Reviewed: 08/02/2009 [facility] Patient Information 2014 [place], [place].

## 2013-03-22 ENCOUNTER — Ambulatory Visit: Payer: Medicaid Other | Attending: Orthopedic Surgery | Admitting: Physical Therapy

## 2013-03-22 DIAGNOSIS — R293 Abnormal posture: Secondary | ICD-10-CM | POA: Insufficient documentation

## 2013-03-22 DIAGNOSIS — M25519 Pain in unspecified shoulder: Secondary | ICD-10-CM | POA: Insufficient documentation

## 2013-03-22 DIAGNOSIS — IMO0001 Reserved for inherently not codable concepts without codable children: Secondary | ICD-10-CM | POA: Insufficient documentation

## 2013-03-22 DIAGNOSIS — M25619 Stiffness of unspecified shoulder, not elsewhere classified: Secondary | ICD-10-CM | POA: Insufficient documentation

## 2013-03-26 ENCOUNTER — Other Ambulatory Visit: Payer: Self-pay

## 2013-04-05 ENCOUNTER — Ambulatory Visit: Payer: Medicaid Other | Admitting: Physical Medicine & Rehabilitation

## 2013-04-06 ENCOUNTER — Encounter: Payer: Medicaid Other | Attending: Physical Medicine & Rehabilitation

## 2013-04-06 ENCOUNTER — Encounter: Payer: Self-pay | Admitting: Physical Medicine & Rehabilitation

## 2013-04-06 ENCOUNTER — Ambulatory Visit (HOSPITAL_BASED_OUTPATIENT_CLINIC_OR_DEPARTMENT_OTHER): Payer: Medicaid Other | Admitting: Physical Medicine & Rehabilitation

## 2013-04-06 VITALS — BP 110/62 | HR 82 | Resp 14 | Ht 65.0 in | Wt 210.0 lb

## 2013-04-06 DIAGNOSIS — IMO0001 Reserved for inherently not codable concepts without codable children: Secondary | ICD-10-CM | POA: Insufficient documentation

## 2013-04-06 DIAGNOSIS — R209 Unspecified disturbances of skin sensation: Secondary | ICD-10-CM | POA: Insufficient documentation

## 2013-04-06 MED ORDER — PREGABALIN 75 MG PO CAPS: 75.0000 mg | ORAL_CAPSULE | Freq: Two times a day (BID) | ORAL | Status: DC

## 2013-04-06 NOTE — Progress Notes (Signed)
Subjective:    Patient ID: [name], female    DOB: 1975/12/30, 37 y.o.   MRN: [id number]  HPI Status post subacromial decompression surgery 03/03/2013 by orthopedics. They're doing postop pain management. She's also going therapy once per week. Still has all over body pain. Tolerates 50 mg of Lyrica twice a day however no significant improvement with this. Pain Inventory Average Pain 8 Pain Right Now 5 My pain is sharp, dull and aching  In the last 24 hours, has pain interfered with the following? General activity 7 Relation with others 7 Enjoyment of life 8 What TIME of day is your pain at its worst? morning and night Sleep (in general) Poor  Pain is worse with: walking, bending, sitting, inactivity and some activites Pain improves with: rest and heat/ice Relief from Meds: n/a  Mobility how many minutes can you walk? 30 ability to climb steps?  yes do you drive?  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  no  Physicians involved in your care Any changes since last visit?  no   Family History  Problem Relation Age of Onset  . Diabetes Mother   . Hypertension Mother   . Thyroid disease Sister    History   Social History  . Marital Status: Divorced    Spouse Name: N/A    Number of Children: N/A  . Years of Education: N/A   Social History Main Topics  . Smoking status: Never Smoker   . Smokeless tobacco: Never Used  . Alcohol Use: No  . Drug Use: No  . Sexual Activity: None   Other Topics Concern  . None   Social History Narrative  . None   Past Surgical History  Procedure Laterality Date  . Salpingectomy  12/12/2003    right; with TAH  . Cesarean section  12/29/2000; 1994  . Bladder neck reconstruction  01/14/2011    Procedure: BLADDER NECK REPAIR;  Surgeon: Reva Bores, MD;  Location: [facility];  Service: Gynecology;  Laterality: N/A;   Laparoscopic Repair of Incidental Cystotomy  . Ankle arthroscopy  12/29/2007    right; with extensive debridement  . Abdominal hysterectomy  12/12/2003  . Tubal ligation  12/29/2000  . Dilation and curettage of uterus  07/08/2003    open laparoscopy  . Repair peroneal tendons ankle  01/03/2010    repair right subluxing peroneal tendons  . Tonsillectomy  as a child  . Adenoidectomy  05/2011  . Laparoscopic salpingoopherectomy  01/14/2011    left  . Right oophorectomy      with lysis of adhesions  . Breast reduction surgery  08/05/2011    Procedure: MAMMARY REDUCTION  (BREAST);  Surgeon: Louisa Second, MD;  Location: [facility];  Service: Plastics;  Laterality: Bilateral;  bilateral   Past Medical History  Diagnosis Date  . GERD (gastroesophageal reflux disease)   . Anemia   . Constipation   . Hx of endometriosis   . Macromastia 07/2011   BP 110/62  Pulse 82  Resp 14  Ht 5\' 5"  (1.651 m)  Wt 210 lb (95.255 kg)  BMI 34.95 kg/m2  SpO2 100%  LMP 12/27/2003     Review of Systems  Gastrointestinal: Positive for nausea and constipation.  Genitourinary: Positive for difficulty urinating.  Musculoskeletal: Positive for back pain.  Psychiatric/Behavioral: The patient is nervous/anxious.   All other systems reviewed and are  negative.       Objective:   Physical Exam  Ltd. range of motion right shoulder. Normal lower extremity strength. No tenderness palpation in the fibromyalgia tender points.      Assessment & Plan:  1. History of fibromyalgia syndrome exam looks okay today however patient still complaining of pain. We'll start on a higher dose of Lyrica 75 mg twice a day return to clinic in 2 months. Reiterated recommendation for exercise walking 10 minutes 3 times per day 2. Right postoperative shoulder pain. Patient will follow up with orthopedics.

## 2013-04-06 NOTE — Patient Instructions (Signed)
Please walk 10 minutes 3 times a day. This will not have a negative effect on the shoulder.  Followup with orthopedics for your shoulder issues

## 2013-04-07 ENCOUNTER — Ambulatory Visit: Payer: Medicaid Other | Attending: Orthopedic Surgery | Admitting: Physical Therapy

## 2013-04-07 DIAGNOSIS — M25519 Pain in unspecified shoulder: Secondary | ICD-10-CM | POA: Insufficient documentation

## 2013-04-07 DIAGNOSIS — IMO0001 Reserved for inherently not codable concepts without codable children: Secondary | ICD-10-CM | POA: Insufficient documentation

## 2013-04-07 DIAGNOSIS — M25619 Stiffness of unspecified shoulder, not elsewhere classified: Secondary | ICD-10-CM | POA: Insufficient documentation

## 2013-04-07 DIAGNOSIS — R293 Abnormal posture: Secondary | ICD-10-CM | POA: Insufficient documentation

## 2013-04-14 ENCOUNTER — Ambulatory Visit: Payer: Medicaid Other | Admitting: Physical Therapy

## 2013-04-22 ENCOUNTER — Ambulatory Visit: Payer: Medicaid Other | Admitting: Physical Therapy

## 2013-06-07 ENCOUNTER — Encounter: Payer: Self-pay | Admitting: Physical Medicine & Rehabilitation

## 2013-06-07 ENCOUNTER — Encounter: Payer: Medicaid Other | Attending: Physical Medicine & Rehabilitation

## 2013-06-07 ENCOUNTER — Ambulatory Visit (HOSPITAL_BASED_OUTPATIENT_CLINIC_OR_DEPARTMENT_OTHER): Payer: Medicaid Other | Admitting: Physical Medicine & Rehabilitation

## 2013-06-07 ENCOUNTER — Telehealth: Payer: Self-pay | Admitting: Physical Medicine & Rehabilitation

## 2013-06-07 VITALS — BP 117/71 | HR 95 | Resp 14 | Ht 65.0 in | Wt 220.0 lb

## 2013-06-07 DIAGNOSIS — IMO0001 Reserved for inherently not codable concepts without codable children: Secondary | ICD-10-CM

## 2013-06-07 DIAGNOSIS — G8929 Other chronic pain: Secondary | ICD-10-CM

## 2013-06-07 DIAGNOSIS — M545 Other chronic pain: Secondary | ICD-10-CM

## 2013-06-07 DIAGNOSIS — R209 Unspecified disturbances of skin sensation: Secondary | ICD-10-CM | POA: Insufficient documentation

## 2013-06-07 MED ORDER — DICLOFENAC SODIUM 1 % TD GEL: 2.0000 g | Freq: Four times a day (QID) | TRANSDERMAL | Status: DC

## 2013-06-07 NOTE — Progress Notes (Signed)
Subjective:    Patient ID: [name], female    DOB: 01-31-1976, 38 y.o.   MRN: [id number]  HPI On Lyrica 100mg  TID by PCP Jan 20 Walking 2-4 miles 6 times a weeks Right knee hurting Using [facility] to knee Back Pain- MRI 2012, unremarkable Chronic low back pain has had some success with chiropractic in the past. Pain Inventory Average Pain 7 Pain Right Now 5 My pain is sharp, dull and aching  In the last 24 hours, has pain interfered with the following? General activity 7 Relation with others 7 Enjoyment of life 8 What TIME of day is your pain at its worst? morning and night Sleep (in general) Poor  Pain is worse with: walking, bending, sitting, inactivity, standing and some activites Pain improves with: rest, heat/ice and medication Relief from Meds: 0  Mobility walk without assistance how many minutes can you walk? 30 ability to climb steps?  yes do you drive?  yes Do you have any goals in this area?  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  no  Physicians involved in your care Any changes since last visit?  no   Family History  Problem Relation Age of Onset  . Diabetes Mother   . Hypertension Mother   . Thyroid disease Sister    History   Social History  . Marital Status: Divorced    Spouse Name: N/A    Number of Children: N/A  . Years of Education: N/A   Social History Main Topics  . Smoking status: Never Smoker   . Smokeless tobacco: Never Used  . Alcohol Use: No  . Drug Use: No  . Sexual Activity: None   Other Topics Concern  . None   Social History Narrative  . None   Past Surgical History  Procedure Laterality Date  . Salpingectomy  12/12/2003    right; with TAH  . Cesarean section  12/29/2000; 1994  . Bladder neck reconstruction  01/14/2011    Procedure: BLADDER NECK REPAIR;  Surgeon: Reva Bores, MD;  Location: [facility];   Service: Gynecology;  Laterality: N/A;  Laparoscopic Repair of Incidental Cystotomy  . Ankle arthroscopy  12/29/2007    right; with extensive debridement  . Abdominal hysterectomy  12/12/2003  . Tubal ligation  12/29/2000  . Dilation and curettage of uterus  07/08/2003    open laparoscopy  . Repair peroneal tendons ankle  01/03/2010    repair right subluxing peroneal tendons  . Tonsillectomy  as a child  . Adenoidectomy  05/2011  . Laparoscopic salpingoopherectomy  01/14/2011    left  . Right oophorectomy      with lysis of adhesions  . Breast reduction surgery  08/05/2011    Procedure: MAMMARY REDUCTION  (BREAST);  Surgeon: Louisa Second, MD;  Location: [facility];  Service: Plastics;  Laterality: Bilateral;  bilateral   Past Medical History  Diagnosis Date  . GERD (gastroesophageal reflux disease)   . Anemia   . Constipation   . Hx of endometriosis   . Macromastia 07/2011   BP 117/71  Pulse 95  Resp 14  Ht 5\' 5"  (1.651 m)  Wt 220 lb (99.791 kg)  BMI 36.61 kg/m2  SpO2 98%  LMP 12/27/2003  Opioid Risk Score: 5 Fall Risk Score: Low Fall Risk (0-5 points) (patient educated handout given)   Review of Systems  Genitourinary:  Positive for urgency.  Musculoskeletal: Positive for back pain.       Objective:   Physical Exam  Tenderness to palpation right lateral patellar tendon. No joint swelling. No erythema. Full range of motion Lumbar spine full range of motion Negative straight leg raising test Full strength in bilateral upper and lower extremities Deep tendon reflexes are sluggish bilateral lower extremities Ambulates without evidence of toe drag or knee instability Sensation intact in lower extremity Mood and affect appropriate       Assessment & Plan:  1. Fibromyalgia syndrome I think this is her overall issue. She is on increasing doses of Lyrica without adverse reaction she thinks she may be gaining some weight. She is more active now and is able to  walk however she increased her ambulation distance to rapidly and is developing some knee pain. We went over exercise schedule increase in no more than 10% per week. Back down to 1 mile per day.  2. Right patellar tendinitis will start diclofenac gel and reduce ambulating distance  #3 chronic low back pain last MRI 2012 unremarkable mild degenerative disc. No new injuries. No radicular symptoms or signs. Referral to chiropractic as this has been helpful no past

## 2013-06-07 NOTE — Telephone Encounter (Signed)
I don't know who takes medicaid among chiropracters

## 2013-06-07 NOTE — Patient Instructions (Signed)
Continue to walk on a daily basis but reduced to 1 mile 6 days per week. Increase by 0.1 mile per week to a goal of 2 miles per day   Dr. Lorel MonacoAaron Williams [facility] [address] [place], [place] [id number] [phone]

## 2013-06-07 NOTE — Telephone Encounter (Signed)
Dr. Genene ChurnAaron Williams was called to set up appointment for patient.... Unfortunately they do not take Medicaid ... Pt was concern about this as well but does not has anyone in mind... Please advise Thanks in advance.[name]..[name]

## 2013-06-09 ENCOUNTER — Emergency Department (HOSPITAL_COMMUNITY)
Admission: EM | Admit: 2013-06-09 | Discharge: 2013-06-09 | Disposition: A | Discharge status: Home / Self Care | Payer: Medicaid Other | Source: Home / Self Care | Attending: Family Medicine | Admitting: Family Medicine

## 2013-06-09 ENCOUNTER — Encounter (HOSPITAL_COMMUNITY): Payer: Self-pay | Admitting: Emergency Medicine

## 2013-06-09 DIAGNOSIS — R519 Headache, unspecified: Secondary | ICD-10-CM

## 2013-06-09 DIAGNOSIS — R51 Headache: Secondary | ICD-10-CM

## 2013-06-09 DIAGNOSIS — R0982 Postnasal drip: Secondary | ICD-10-CM

## 2013-06-09 MED ORDER — DICLOFENAC SODIUM 75 MG PO TBEC: 75.0000 mg | DELAYED_RELEASE_TABLET | Freq: Two times a day (BID) | ORAL | Status: DC | PRN

## 2013-06-09 MED ORDER — IPRATROPIUM BROMIDE 0.06 % NA SOLN: 2.0000 | Freq: Four times a day (QID) | NASAL | Status: DC

## 2013-06-09 NOTE — Discharge Instructions (Signed)
Thank you for coming in today. Take diclofenac twice daily as needed for headache or pain. Use Atrovent nasal spray as needed. Try using a sugarfree sour candy for dry mouth. Go to the emergency room if your headache becomes excruciating or you have weakness or numbness or uncontrolled vomiting.

## 2013-06-09 NOTE — ED Notes (Signed)
C/o 2 week duration of generalized pain  (abdominal , chest, headache, ) nausea, feeling faint, dry mouth, vaginal bleeding (1st period since 2005)

## 2013-06-09 NOTE — ED Provider Notes (Signed)
[name] is a 38 y.o. female who presents to Urgent Care today for multiple complaints. She would like to talk about headache and dry throat.. 1) headache: Patient is at a moderate general headache for the last few weeks. It tends to come and go. She denies any visual change weakness or numbness. She denies any loss of coordination slurring her speech or difficulty swallowing. She has not tried any medications yet. She notes this is associated with some mild fatigue. She feels well otherwise.  2) dry throat: Patient has 4 days of a sensation as though her throat is "dry". She denies any sore throat or trouble swallowing. She denies any sensation as though the food is getting stuck. No new medications. Patient denies taking any anticholinergic medications.   Past Medical History  Diagnosis Date  . GERD (gastroesophageal reflux disease)   . Anemia   . Constipation   . Hx of endometriosis   . Macromastia 07/2011   History  Substance Use Topics  . Smoking status: Never Smoker   . Smokeless tobacco: Never Used  . Alcohol Use: No   ROS as above Medications: No current facility-administered medications for this encounter.   Current Outpatient Prescriptions  Medication Sig Dispense Refill  . diclofenac (VOLTAREN) 75 MG EC tablet Take 1 tablet (75 mg total) by mouth 2 (two) times daily as needed.  60 tablet  0  . diclofenac sodium (VOLTAREN) 1 % GEL Apply 2 g topically 4 (four) times daily.  3 Tube  2  . docusate sodium (COLACE) 100 MG capsule Take 100 mg by mouth 2 (two) times daily.      [name] EPINEPHrine (EPIPEN 2-PAK) 0.3 mg/0.3 mL DEVI Inject 0.3 mLs (0.3 mg total) into the muscle once as needed (for severe allergic reaction). CAll 911 immediately if you have to use this medicine  1 Device  1  . esomeprazole (NEXIUM) 40 MG capsule Take 40 mg by mouth every morning.       [name] ibuprofen (ADVIL,MOTRIN) 200 MG tablet Take 600 mg by mouth every 6 (six) hours as needed for pain.      [name]  ipratropium (ATROVENT) 0.06 % nasal spray Place 2 sprays into both nostrils 4 (four) times daily.  15 mL  1  . Iron-Folic Acid-B12-C-Docusate (FERRAPLUS 90) 90-1 MG TABS Take 1 tablet by mouth daily as needed (for anemia).       . loratadine (CLARITIN) 10 MG tablet Take 10 mg by mouth every morning.      . Multiple Vitamin (MULTIVITAMIN WITH MINERALS) TABS Take 1 tablet by mouth every other day.      . Olopatadine HCl (PATADAY) 0.2 % SOLN Apply 1 drop to eye daily.      . polyethylene glycol (MIRALAX / GLYCOLAX) packet Take 17 g by mouth daily as needed (constipation).       . pregabalin (LYRICA) 75 MG capsule Take 1 capsule (75 mg total) by mouth 2 (two) times daily.  60 capsule  1  . Soft Lens Products (REWETTING DROPS) SOLN Place 2 drops into both eyes daily as needed (FOR DRY EYES).      . traMADol (ULTRAM) 50 MG tablet Take 50 mg by mouth every 6 (six) hours as needed for pain.      [name] triamcinolone cream (KENALOG) 0.1 % Apply 1 application topically daily as needed (for rash).        Exam:  BP 115/68  Pulse 90  Temp(Src) 98.3 F (36.8 C) (Oral)  Resp 16  SpO2 100%  LMP 12/27/2003 Gen: Well NAD HEENT: EOMI,  MMM, posterior pharynx with cobblestoning. Tympanic membranes are normal appearing bilaterally. PERRLA bilaterally Retinal exam shows no bulging or cupping of the optic disc. Lungs: Normal work of breathing. CTABL Heart: RRR no MRG Abd: NABS, Soft. NT, ND Exts: Brisk capillary refill, warm and well perfused.  Neuro: Alert and oriented cranial nerves II through XII are intact. Normal strength sensation balance coordination. Reflexes are symmetric and normal bilaterally. Normal gait.  Assessment and Plan: 38 y.o. female with  1) headache: Unclear etiology. No abnormal neurologic exam findings. Funduscopic exam is normal. Plan to use diclofenac and followup with primary care provider.  2) dry throat: Likely secondary to postnasal drip. Plan to use Atrovent nasal spray.  Additionally recommend using sugarfree sour candy as needed.  Discussed warning signs or symptoms. Please see discharge instructions. Patient expresses understanding.    Rodolph BongEvan S Corey, MD 06/09/13 [phone]

## 2013-07-12 ENCOUNTER — Ambulatory Visit: Payer: Medicaid Other | Admitting: Physical Medicine & Rehabilitation

## 2013-09-06 ENCOUNTER — Ambulatory Visit: Payer: Medicaid Other | Admitting: Physical Medicine & Rehabilitation

## 2013-11-12 ENCOUNTER — Emergency Department (HOSPITAL_COMMUNITY): Payer: Medicaid Other

## 2013-11-12 ENCOUNTER — Emergency Department (HOSPITAL_COMMUNITY)
Admission: EM | Admit: 2013-11-12 | Discharge: 2013-11-12 | Disposition: A | Discharge status: Home / Self Care | Payer: Medicaid Other | Attending: Emergency Medicine | Admitting: Emergency Medicine

## 2013-11-12 ENCOUNTER — Encounter (HOSPITAL_COMMUNITY): Payer: Self-pay | Admitting: Emergency Medicine

## 2013-11-12 ENCOUNTER — Emergency Department (HOSPITAL_COMMUNITY)
Admission: EM | Admit: 2013-11-12 | Discharge: 2013-11-12 | Disposition: A | Discharge status: Home / Self Care | Payer: Medicaid Other | Source: Home / Self Care | Attending: Emergency Medicine | Admitting: Emergency Medicine

## 2013-11-12 DIAGNOSIS — R188 Other ascites: Secondary | ICD-10-CM

## 2013-11-12 DIAGNOSIS — Z862 Personal history of diseases of the blood and blood-forming organs and certain disorders involving the immune mechanism: Secondary | ICD-10-CM | POA: Insufficient documentation

## 2013-11-12 DIAGNOSIS — K219 Gastro-esophageal reflux disease without esophagitis: Secondary | ICD-10-CM | POA: Diagnosis not present

## 2013-11-12 DIAGNOSIS — K7689 Other specified diseases of liver: Secondary | ICD-10-CM | POA: Insufficient documentation

## 2013-11-12 DIAGNOSIS — Z79899 Other long term (current) drug therapy: Secondary | ICD-10-CM | POA: Insufficient documentation

## 2013-11-12 DIAGNOSIS — Z8742 Personal history of other diseases of the female genital tract: Secondary | ICD-10-CM | POA: Diagnosis not present

## 2013-11-12 DIAGNOSIS — R109 Unspecified abdominal pain: Secondary | ICD-10-CM | POA: Diagnosis present

## 2013-11-12 DIAGNOSIS — R339 Retention of urine, unspecified: Secondary | ICD-10-CM

## 2013-11-12 DIAGNOSIS — K5900 Constipation, unspecified: Secondary | ICD-10-CM | POA: Insufficient documentation

## 2013-11-12 DIAGNOSIS — Z3202 Encounter for pregnancy test, result negative: Secondary | ICD-10-CM | POA: Insufficient documentation

## 2013-11-12 DIAGNOSIS — K769 Liver disease, unspecified: Secondary | ICD-10-CM

## 2013-11-12 LAB — PREGNANCY, URINE
Preg Test, Ur: NEGATIVE
Preg Test, Ur: NEGATIVE

## 2013-11-12 LAB — COMPREHENSIVE METABOLIC PANEL
ALT: 23 U/L (ref 0–35)
AST: 24 U/L (ref 0–37)
Albumin: 3.9 g/dL (ref 3.5–5.2)
Alkaline Phosphatase: 99 U/L (ref 39–117)
Anion gap: 13 (ref 5–15)
BUN: 8 mg/dL (ref 6–23)
CO2: 27 mEq/L (ref 19–32)
Calcium: 8.9 mg/dL (ref 8.4–10.5)
Chloride: 99 mEq/L (ref 96–112)
Creatinine, Ser: 0.87 mg/dL (ref 0.50–1.10)
GFR calc Af Amer: >90 mL/min (ref >90)
GFR calc non Af Amer: 83 mL/min — ABNORMAL LOW (ref >90)
Glucose, Bld: 91 mg/dL (ref 70–99)
Potassium: 3.4 mEq/L — ABNORMAL LOW (ref 3.7–5.3)
Sodium: 139 mEq/L (ref 137–147)
Total Bilirubin: <0.2 mg/dL — ABNORMAL LOW (ref 0.3–1.2)
Total Protein: 7.6 g/dL (ref 6.0–8.3)

## 2013-11-12 LAB — URINALYSIS, ROUTINE W REFLEX MICROSCOPIC
Bilirubin Urine: NEGATIVE
Bilirubin Urine: NEGATIVE
Glucose, UA: NEGATIVE mg/dL
Glucose, UA: NEGATIVE mg/dL
Hgb urine dipstick: NEGATIVE
Hgb urine dipstick: NEGATIVE
Ketones, ur: NEGATIVE mg/dL
Ketones, ur: NEGATIVE mg/dL
Leukocytes, UA: NEGATIVE
Leukocytes, UA: NEGATIVE
Nitrite: NEGATIVE
Nitrite: NEGATIVE
Protein, ur: NEGATIVE mg/dL
Protein, ur: NEGATIVE mg/dL
Specific Gravity, Urine: 1.01 (ref 1.005–1.030)
Specific Gravity, Urine: 1.014 (ref 1.005–1.030)
Urobilinogen, UA: 0.2 mg/dL (ref 0.0–1.0)
Urobilinogen, UA: 0.2 mg/dL (ref 0.0–1.0)
pH: 5.5 (ref 5.0–8.0)
pH: 5.5 (ref 5.0–8.0)

## 2013-11-12 LAB — LIPASE, BLOOD: Lipase: 23 U/L (ref 11–59)

## 2013-11-12 LAB — CBC WITH DIFFERENTIAL/PLATELET
Basophils Absolute: 0 10*3/uL (ref 0.0–0.1)
Basophils Relative: 0 % (ref 0–1)
Eosinophils Absolute: 0.1 10*3/uL (ref 0.0–0.7)
Eosinophils Relative: 1 % (ref 0–5)
HCT: 32.1 % — ABNORMAL LOW (ref 36.0–46.0)
Hemoglobin: 10 g/dL — ABNORMAL LOW (ref 12.0–15.0)
Lymphocytes Relative: 37 % (ref 12–46)
Lymphs Abs: 2.9 10*3/uL (ref 0.7–4.0)
MCH: 23.5 pg — ABNORMAL LOW (ref 26.0–34.0)
MCHC: 31.2 g/dL (ref 30.0–36.0)
MCV: 75.5 fL — ABNORMAL LOW (ref 78.0–100.0)
Monocytes Absolute: 0.5 10*3/uL (ref 0.1–1.0)
Monocytes Relative: 6 % (ref 3–12)
Neutro Abs: 4.3 10*3/uL (ref 1.7–7.7)
Neutrophils Relative %: 56 % (ref 43–77)
Platelets: 274 10*3/uL (ref 150–400)
RBC: 4.25 MIL/uL (ref 3.87–5.11)
RDW: 14.4 % (ref 11.5–15.5)
WBC: 7.8 10*3/uL (ref 4.0–10.5)

## 2013-11-12 LAB — I-STAT TROPONIN, ED: Troponin i, poc: 0.01 ng/mL (ref 0.00–0.08)

## 2013-11-12 MED ORDER — SODIUM CHLORIDE 0.9 % IJ SOLN
INTRAMUSCULAR | Status: AC
  Filled 2013-11-12: qty 50

## 2013-11-12 MED ORDER — IOHEXOL 300 MG/ML  SOLN
25.0000 mL | Freq: Once | INTRAMUSCULAR | Status: AC | PRN
  Administered 2013-11-12: 25 mL via ORAL

## 2013-11-12 MED ORDER — OXYCODONE-ACETAMINOPHEN 5-325 MG PO TABS
1.0000 | ORAL_TABLET | Freq: Once | ORAL | Status: AC
  Administered 2013-11-12: 1 via ORAL
  Filled 2013-11-12: qty 1

## 2013-11-12 MED ORDER — HYDROMORPHONE HCL PF 1 MG/ML IJ SOLN
1.0000 mg | Freq: Once | INTRAMUSCULAR | Status: AC
  Administered 2013-11-12: 1 mg via INTRAVENOUS
  Filled 2013-11-12: qty 1

## 2013-11-12 MED ORDER — PHENAZOPYRIDINE HCL 200 MG PO TABS: 200.0000 mg | ORAL_TABLET | Freq: Three times a day (TID) | ORAL | Status: DC | PRN

## 2013-11-12 MED ORDER — OXYCODONE-ACETAMINOPHEN 5-325 MG PO TABS: 1.0000 | ORAL_TABLET | Freq: Four times a day (QID) | ORAL | Status: DC | PRN

## 2013-11-12 MED ORDER — ONDANSETRON HCL 4 MG/2ML IJ SOLN
4.0000 mg | Freq: Once | INTRAMUSCULAR | Status: AC
  Administered 2013-11-12: 4 mg via INTRAVENOUS
  Filled 2013-11-12: qty 2

## 2013-11-12 MED ORDER — ONDANSETRON 4 MG PO TBDP: ORAL_TABLET | ORAL | Status: DC

## 2013-11-12 MED ORDER — SODIUM CHLORIDE 0.9 % IV BOLUS (SEPSIS)
1000.0000 mL | INTRAVENOUS | Status: AC
  Administered 2013-11-12: 1000 mL via INTRAVENOUS

## 2013-11-12 MED ORDER — IOHEXOL 300 MG/ML  SOLN
100.0000 mL | Freq: Once | INTRAMUSCULAR | Status: AC | PRN
  Administered 2013-11-12: 100 mL via INTRAVENOUS

## 2013-11-12 MED ORDER — ONDANSETRON 4 MG PO TBDP
8.0000 mg | ORAL_TABLET | Freq: Once | ORAL | Status: AC
  Administered 2013-11-12: 8 mg via ORAL
  Filled 2013-11-12: qty 2

## 2013-11-12 NOTE — ED Notes (Addendum)
Pt reports she was seen here this am for abdominal pain and urinary retention. States she has not went to the bathroom since her bladder was drained this am. Pt reports she continues to have pain and discomfort to lower abdomen. Pt tearful in triage.

## 2013-11-12 NOTE — ED Provider Notes (Signed)
CSN: [id number]     Arrival date & time 11/12/13  1342 History   First MD Initiated Contact with Patient 11/12/13 1647     Chief Complaint  Patient presents with  . Abdominal Pain  . Urinary Retention     (Consider location/radiation/quality/duration/timing/severity/associated sxs/prior Treatment) HPI  Patient is 38 yo female with hx of GERD, endometriosis, multiple abdominal surgeries who presents with urinary retention and abdominal pain. She has bilateral pelvic region pain that radiates to bilateral flanks and also has right low back pain. She first noticed pain when she urinated yesterday morning. It was sudden onset. Throughout the day the pain got worse with urination. She was eventually unable to urinate since 8 PM last night. She came to the ED because of this issue last night and in/out folley was done to drain her bladder. UA that time was normal. CT abdomen was done which showed small ascites and liver mass but no other acute findings. She came back because she was unable to urinate since discharge. She is feeling more fullness in the stomach and it's hard to move around because of the pain.  Has some nausea, no emesis. No diarreha,constipation, fever, chills. No leg pain or muscle weakness. No numbness or tingling. Denies any vaginal bleeding or discharge. Hx of hysterectomy. Has hx of bladder neck repair due to endometriosis. She was doing well before the sudden onset of the pain yesterday morning.  Past Medical History  Diagnosis Date  . GERD (gastroesophageal reflux disease)   . Anemia   . Constipation   . Hx of endometriosis   . Macromastia 07/2011   Past Surgical History  Procedure Laterality Date  . Salpingectomy  12/12/2003    right; with TAH  . Cesarean section  12/29/2000; 1994  . Bladder neck reconstruction  01/14/2011    Procedure: BLADDER NECK REPAIR;  Surgeon: Reva Boresanya S Pratt, MD;  Location: [facility];  Service: Gynecology;  Laterality: N/A;  Laparoscopic Repair of  Incidental Cystotomy  . Ankle arthroscopy  12/29/2007    right; with extensive debridement  . Abdominal hysterectomy  12/12/2003  . Tubal ligation  12/29/2000  . Dilation and curettage of uterus  07/08/2003    open laparoscopy  . Repair peroneal tendons ankle  01/03/2010    repair right subluxing peroneal tendons  . Tonsillectomy  as a child  . Adenoidectomy  05/2011  . Laparoscopic salpingoopherectomy  01/14/2011    left  . Right oophorectomy      with lysis of adhesions  . Breast reduction surgery  08/05/2011    Procedure: MAMMARY REDUCTION  (BREAST);  Surgeon: Louisa SecondGerald Truesdale, MD;  Location: [facility];  Service: Plastics;  Laterality: Bilateral;  bilateral   Family History  Problem Relation Age of Onset  . Diabetes Mother   . Hypertension Mother   . Thyroid disease Sister    History  Substance Use Topics  . Smoking status: Never Smoker   . Smokeless tobacco: Never Used  . Alcohol Use: No   OB History   Grav Para Term Preterm Abortions TAB SAB Ect Mult Living   2 2 2       2      Review of Systems  Constitutional: Negative for fever, chills, diaphoresis and fatigue.  HENT: Negative.   Eyes: Negative.   Respiratory: Negative.   Cardiovascular: Negative.   Gastrointestinal: Positive for nausea, abdominal pain and abdominal distention. Negative for vomiting, diarrhea, constipation, blood in stool and rectal pain.  Endocrine: Negative.  Genitourinary: Positive for dysuria, flank pain, decreased urine volume, difficulty urinating and pelvic pain.  Skin: Negative.   Allergic/Immunologic: Negative.   Neurological: Negative.   Hematological: Negative.   Psychiatric/Behavioral: Negative.       Allergies  Other  Home Medications   Prior to Admission medications   Medication Sig Start Date End Date Taking? Authorizing Provider  diclofenac (VOLTAREN) 75 MG EC tablet Take 75 mg by mouth 2 (two) times daily as needed for mild pain.   Yes Historical Provider, MD    docusate sodium (COLACE) 100 MG capsule Take 100 mg by mouth 2 (two) times daily.   Yes Historical Provider, MD  EPINEPHrine (EPIPEN 2-PAK) 0.3 mg/0.3 mL DEVI Inject 0.3 mLs (0.3 mg total) into the muscle once as needed (for severe allergic reaction). CAll 911 immediately if you have to use this medicine 09/09/12  Yes Jennifer L Piepenbrink, PA-C  esomeprazole (NEXIUM) 40 MG capsule Take 40 mg by mouth every morning.    Yes Historical Provider, MD  ipratropium (ATROVENT) 0.06 % nasal spray Place 2 sprays into both nostrils 4 (four) times daily. 06/09/13  Yes Rodolph Bong, MD  loratadine (CLARITIN) 10 MG tablet Take 10 mg by mouth every morning.   Yes Historical Provider, MD  Olopatadine HCl (PATADAY) 0.2 % SOLN Place 1 drop into both eyes daily.    Yes Historical Provider, MD  ondansetron (ZOFRAN ODT) 4 MG disintegrating tablet 4mg  ODT q4 hours prn nausea/vomit 11/12/13  Yes Forrest Mort Sawyers, MD  polyethylene glycol (MIRALAX / GLYCOLAX) packet Take 17 g by mouth daily as needed (constipation).    Yes Historical Provider, MD  Soft Lens Products (REWETTING DROPS) SOLN Place 2 drops into both eyes daily as needed (FOR DRY EYES).   Yes Historical Provider, MD  triamcinolone cream (KENALOG) 0.1 % Apply 1 application topically daily as needed (for rash).   Yes Historical Provider, MD  oxyCODONE-acetaminophen (PERCOCET) 5-325 MG per tablet Take 1 tablet by mouth every 6 (six) hours as needed. 11/12/13   Junius Argyle, MD   BP 121/79  Pulse 57  Temp(Src) 98.1 F (36.7 C) (Oral)  Resp 18  SpO2 100%  LMP 12/27/2003 Physical Exam  Constitutional: She is oriented to person, place, and time. She appears well-developed and well-nourished. No distress.  HENT:  Head: Normocephalic and atraumatic.  Right Ear: External ear normal.  Left Ear: External ear normal.  Eyes: Conjunctivae and EOM are normal. Pupils are equal, round, and reactive to light. No scleral icterus.  Neck: Normal range of motion. Neck  supple. No JVD present.  Cardiovascular: Normal rate and regular rhythm.  Exam reveals gallop. Exam reveals no friction rub.   No murmur heard. Pulmonary/Chest: Effort normal and breath sounds normal. No respiratory distress. She has no wheezes. She has no rales. She exhibits no tenderness.  Abdominal: She exhibits distension. There is tenderness in the right lower quadrant, periumbilical area, suprapubic area and left lower quadrant. There is guarding and CVA tenderness.    Neurological: She is alert and oriented to person, place, and time. She has normal strength. No cranial nerve deficit or sensory deficit.  Reflex Scores:      Patellar reflexes are 2+ on the right side and 2+ on the left side.      Achilles reflexes are 2+ on the right side and 2+ on the left side. No saddle anesthesia.   Skin: She is not diaphoretic.    ED Course  Procedures (including critical care  time) Labs Review Labs Reviewed  URINALYSIS, ROUTINE W REFLEX MICROSCOPIC  PREGNANCY, URINE    Imaging Review Ct Abdomen Pelvis W Contrast  11/12/2013   CLINICAL DATA:  Left lower quadrant abdominal pain beginning yesterday. Difficulty urinating.  EXAM: CT ABDOMEN AND PELVIS WITH CONTRAST  TECHNIQUE: Multidetector CT imaging of the abdomen and pelvis was performed using the standard protocol following bolus administration of intravenous contrast.  CONTRAST:  OMNIPAQUE IOHEXOL 300 MG/ML  SOLN  COMPARISON:  02/03/2010  FINDINGS: Atelectasis and motion artifact in the lung bases.  Small amount of free fluid in the upper abdomen, mesenteric, pericolic gutters, and pelvis. Density measurements are suggestive of ascites. Focal low-attenuation lesion in the medial segment left lobe of the liver inferiorly measuring about 3 cm diameter. This appears to be a solid lesion as a cause is contour deformity on the liver surface. This lesion has been present previously. Enhancement pattern is indeterminate. Recommend elective MRI for  further characterization.  Surgical absence of the gallbladder. The spleen, adrenal glands, pancreas, kidneys, abdominal aorta, inferior vena cava, and retroperitoneal lymph nodes are unremarkable. Stomach is decompressed. Small bowel are decompressed. Contrast material flows through the colon without evidence of obstruction. No free air in the abdomen.  Pelvis: Bladder wall is not thickened. No changes of diverticulitis. Appendix is not identified. No pelvic lymphadenopathy. No destructive bone lesions.  IMPRESSION: Mild abdominal and pelvic ascites of indeterminate etiology. Indeterminate lesion in the liver. Elective MRI is recommended for further evaluation. No evidence of bowel obstruction or bowel wall thickening.   Electronically Signed   By: Burman Nieves M.D.   On: 11/12/2013 07:01     EKG Interpretation None      Gave percocet for pain  UA from yesterday was negative.  Will insert foley. MDM   Final diagnoses:  Urinary retention   Unclear etiology of urinary retention. CT abdomen was unremarkable for any acute finding that can cause urinary symptom. Labs were all normal (cbc, cmp, UA) . She does not have saddle anesthesia. Will not repeat labs since she had them done last night. Pyridium for symptom relief.   Will send home with foley and have her follow up with urology on Monday.     Hyacinth Meeker, MD 11/12/13 1755

## 2013-11-12 NOTE — ED Notes (Signed)
Pt is having a ride come get her.

## 2013-11-12 NOTE — Discharge Instructions (Signed)

## 2013-11-12 NOTE — ED Provider Notes (Signed)
CSN: [id number]     Arrival date & time 11/12/13  0053 History   First MD Initiated Contact with Patient 11/12/13 0434     Chief Complaint  Patient presents with  . Abdominal Pain     (Consider location/radiation/quality/duration/timing/severity/associated sxs/prior Treatment) Patient is a 38 y.o. female presenting with abdominal pain. The history is provided by the patient.  Abdominal Pain Pain location:  L flank and R flank Pain quality: aching   Pain radiates to:  Does not radiate Pain severity:  Moderate Onset quality:  Sudden Timing:  Intermittent Progression:  Waxing and waning Chronicity:  New Context comment:  During urination Relieved by:  Nothing Worsened by:  Nothing tried Ineffective treatments:  None tried Associated symptoms: nausea   Associated symptoms: no chest pain, no cough, no diarrhea, no dysuria, no fatigue, no fever, no hematuria, no shortness of breath and no vomiting     Past Medical History  Diagnosis Date  . GERD (gastroesophageal reflux disease)   . Anemia   . Constipation   . Hx of endometriosis   . Macromastia 07/2011   Past Surgical History  Procedure Laterality Date  . Salpingectomy  12/12/2003    right; with TAH  . Cesarean section  12/29/2000; 1994  . Bladder neck reconstruction  01/14/2011    Procedure: BLADDER NECK REPAIR;  Surgeon: Reva Bores, MD;  Location: [facility];  Service: Gynecology;  Laterality: N/A;  Laparoscopic Repair of Incidental Cystotomy  . Ankle arthroscopy  12/29/2007    right; with extensive debridement  . Abdominal hysterectomy  12/12/2003  . Tubal ligation  12/29/2000  . Dilation and curettage of uterus  07/08/2003    open laparoscopy  . Repair peroneal tendons ankle  01/03/2010    repair right subluxing peroneal tendons  . Tonsillectomy  as a child  . Adenoidectomy  05/2011  . Laparoscopic salpingoopherectomy  01/14/2011    left  . Right oophorectomy      with lysis of adhesions  . Breast reduction surgery  08/05/2011     Procedure: MAMMARY REDUCTION  (BREAST);  Surgeon: Louisa Second, MD;  Location: [facility];  Service: Plastics;  Laterality: Bilateral;  bilateral   Family History  Problem Relation Age of Onset  . Diabetes Mother   . Hypertension Mother   . Thyroid disease Sister    History  Substance Use Topics  . Smoking status: Never Smoker   . Smokeless tobacco: Never Used  . Alcohol Use: No   OB History   Grav Para Term Preterm Abortions TAB SAB Ect Mult Living   2 2 2       2      Review of Systems  Constitutional: Negative for fever and fatigue.  HENT: Negative for congestion and drooling.   Eyes: Negative for pain.  Respiratory: Negative for cough and shortness of breath.   Cardiovascular: Negative for chest pain.  Gastrointestinal: Positive for nausea and abdominal pain. Negative for vomiting and diarrhea.  Genitourinary: Negative for dysuria and hematuria.  Musculoskeletal: Negative for back pain, gait problem and neck pain.  Skin: Negative for color change.  Neurological: Negative for dizziness and headaches.  Hematological: Negative for adenopathy.  Psychiatric/Behavioral: Negative for behavioral problems.  All other systems reviewed and are negative.     Allergies  Other  Home Medications   Prior to Admission medications   Medication Sig Start Date End Date Taking? Authorizing Provider  diclofenac (VOLTAREN) 75 MG EC tablet Take 75 mg by mouth  2 (two) times daily as needed for mild pain.   Yes Historical Provider, MD  docusate sodium (COLACE) 100 MG capsule Take 100 mg by mouth 2 (two) times daily.   Yes Historical Provider, MD  EPINEPHrine (EPIPEN 2-PAK) 0.3 mg/0.3 mL DEVI Inject 0.3 mLs (0.3 mg total) into the muscle once as needed (for severe allergic reaction). CAll 911 immediately if you have to use this medicine 09/09/12  Yes Jennifer L Piepenbrink, PA-C  esomeprazole (NEXIUM) 40 MG capsule Take 40 mg by mouth every morning.    Yes Historical  Provider, MD  ipratropium (ATROVENT) 0.06 % nasal spray Place 2 sprays into both nostrils 4 (four) times daily. 06/09/13  Yes Rodolph Bong, MD  loratadine (CLARITIN) 10 MG tablet Take 10 mg by mouth every morning.   Yes Historical Provider, MD  Olopatadine HCl (PATADAY) 0.2 % SOLN Place 1 drop into both eyes daily.    Yes Historical Provider, MD  polyethylene glycol (MIRALAX / GLYCOLAX) packet Take 17 g by mouth daily as needed (constipation).    Yes Historical Provider, MD  Soft Lens Products (REWETTING DROPS) SOLN Place 2 drops into both eyes daily as needed (FOR DRY EYES).   Yes Historical Provider, MD  triamcinolone cream (KENALOG) 0.1 % Apply 1 application topically daily as needed (for rash).   Yes Historical Provider, MD   BP 106/63  Pulse 61  Temp(Src) 98.6 F (37 C) (Oral)  Resp 20  SpO2 100%  LMP 12/27/2003 Physical Exam  Nursing note and vitals reviewed. Constitutional: She is oriented to person, place, and time. She appears well-developed and well-nourished.  HENT:  Head: Normocephalic and atraumatic.  Mouth/Throat: Oropharynx is clear and moist. No oropharyngeal exudate.  Eyes: Conjunctivae and EOM are normal. Pupils are equal, round, and reactive to light.  Neck: Normal range of motion. Neck supple.  Cardiovascular: Normal rate, regular rhythm, normal heart sounds and intact distal pulses.  Exam reveals no gallop and no friction rub.   No murmur heard. Pulmonary/Chest: Effort normal and breath sounds normal. No respiratory distress. She has no wheezes.  Abdominal: Soft. Bowel sounds are normal. There is no tenderness. There is no rebound and no guarding.  No focal ttp  Musculoskeletal: Normal range of motion. She exhibits no edema and no tenderness.  No CVA tenderness to palpation bilaterally.  Neurological: She is alert and oriented to person, place, and time.  Skin: Skin is warm and dry.  Psychiatric: She has a normal mood and affect. Her behavior is normal.    ED  Course  Procedures (including critical care time) Labs Review Labs Reviewed  CBC WITH DIFFERENTIAL - Abnormal; Notable for the following:    Hemoglobin 10.0 (*)    HCT 32.1 (*)    MCV 75.5 (*)    MCH 23.5 (*)    All other components within normal limits  COMPREHENSIVE METABOLIC PANEL - Abnormal; Notable for the following:    Potassium 3.4 (*)    Total Bilirubin <0.2 (*)    GFR calc non Af Amer 83 (*)    All other components within normal limits  LIPASE, BLOOD  PREGNANCY, URINE  URINALYSIS, ROUTINE W REFLEX MICROSCOPIC  I-STAT TROPOININ, ED    Imaging Review Ct Abdomen Pelvis W Contrast  11/12/2013   CLINICAL DATA:  Left lower quadrant abdominal pain beginning yesterday. Difficulty urinating.  EXAM: CT ABDOMEN AND PELVIS WITH CONTRAST  TECHNIQUE: Multidetector CT imaging of the abdomen and pelvis was performed using the standard protocol following  bolus administration of intravenous contrast.  CONTRAST:  100mL OMNIPAQUE IOHEXOL 300 MG/ML  SOLN  COMPARISON:  02/03/2010  FINDINGS: Atelectasis and motion artifact in the lung bases.  Small amount of free fluid in the upper abdomen, mesenteric, pericolic gutters, and pelvis. Density measurements are suggestive of ascites. Focal low-attenuation lesion in the medial segment left lobe of the liver inferiorly measuring about 3 cm diameter. This appears to be a solid lesion as a cause is contour deformity on the liver surface. This lesion has been present previously. Enhancement pattern is indeterminate. Recommend elective MRI for further characterization.  Surgical absence of the gallbladder. The spleen, adrenal glands, pancreas, kidneys, abdominal aorta, inferior vena cava, and retroperitoneal lymph nodes are unremarkable. Stomach is decompressed. Small bowel are decompressed. Contrast material flows through the colon without evidence of obstruction. No free air in the abdomen.  Pelvis: Bladder wall is not thickened. No changes of diverticulitis.  Appendix is not identified. No pelvic lymphadenopathy. No destructive bone lesions.  IMPRESSION: Mild abdominal and pelvic ascites of indeterminate etiology. Indeterminate lesion in the liver. Elective MRI is recommended for further evaluation. No evidence of bowel obstruction or bowel wall thickening.   Electronically Signed   By: Burman NievesWilliam  Stevens M.D.   On: 11/12/2013 07:01     EKG Interpretation None      MDM   Final diagnoses:  Liver lesion  Ascites  Bilateral flank pain    4:45 AM 38 y.o. female with a history of multiple OB/GYN surgeries who presents with bilateral flank pain which began gradually yesterday afternoon. It initially started while she was urinating and was worse with bearing down. She notes the pain has been intermittent and coming in waves since then. She denies any fevers, vomiting, or diarrhea. She is afebrile and vital signs are unremarkable here. Will get screening labs and imaging and pain control.  7:49 AM: Labs unremarkable, Hgb at baseline. CT showing indeterminate liver lesion and mild abd/pelvic ascites. Pt's pain has improved w/ pain control and abd remains soft/benign. I discussed the CT findings w/ the pt. I have discussed the diagnosis/risks/treatment options with the patient and believe the pt to be eligible for discharge home to follow-up with her pcp for MRI. We also discussed returning to the ED immediately if new or worsening sx occur. We discussed the sx which are most concerning (e.g., worsening abd pain, fever, inability to tolerate po) that necessitate immediate return. Medications administered to the patient during their visit and any new prescriptions provided to the patient are listed below.  Medications given during this visit Medications  sodium chloride 0.9 % injection (not administered)  oxyCODONE-acetaminophen (PERCOCET/ROXICET) 5-325 MG per tablet 1 tablet (1 tablet Oral Given 11/12/13 0115)  oxyCODONE-acetaminophen (PERCOCET/ROXICET) 5-325  MG per tablet 1 tablet (1 tablet Oral Given 11/12/13 0239)  iohexol (OMNIPAQUE) 300 MG/ML solution 25 mL (25 mLs Oral Contrast Given 11/12/13 0442)  HYDROmorphone (DILAUDID) injection 1 mg (1 mg Intravenous Given 11/12/13 0553)  ondansetron (ZOFRAN) injection 4 mg (4 mg Intravenous Given 11/12/13 0553)  sodium chloride 0.9 % bolus 1,000 mL (1,000 mLs Intravenous New Bag/Given 11/12/13 0553)  iohexol (OMNIPAQUE) 300 MG/ML solution 100 mL (100 mLs Intravenous Contrast Given 11/12/13 0641)    New Prescriptions   ONDANSETRON (ZOFRAN ODT) 4 MG DISINTEGRATING TABLET    4mg  ODT q4 hours prn nausea/vomit   OXYCODONE-ACETAMINOPHEN (PERCOCET) 5-325 MG PER TABLET    Take 1 tablet by mouth every 6 (six) hours as needed.  Junius Argyle, MD 11/12/13 [phone]

## 2013-11-12 NOTE — Discharge Instructions (Signed)
Please make appointment on Monday to see the urologist for your urinary retention. You can take pyridium for symptom relief. This medication can cause your urine to turn orange colored, which is normal.

## 2013-11-12 NOTE — ED Notes (Signed)
Per Pt: Pt reports abdominal pain that started yesterday, became worse today. Pain most severe in LLQ. Pt reports extreme difficulty urinating, causes the pain to increase significantly. Ax4, NAD at this time.

## 2013-11-13 NOTE — ED Provider Notes (Signed)
I saw and evaluated the patient, reviewed the resident's note and I agree with the findings and plan. Patient is a 38 year old female with history of GERD, several abdominal surgeries. She presents today with complaints of urinary retention. She was seen yesterday for abdominal pain and urinary retention. Workup revealed a normal CT scan and laboratory studies that were unremarkable. She returns today with complaints of being unable to void.  On exam, vitals are stable and she is afebrile. Head is atraumatic, normocephalic. Neck is supple. Heart is regular rate and rhythm without murmurs. Lungs are clear. Abdominal exam reveals mild tenderness in the suprapubic region. There is no rebound and no guarding. Strength is 5 out of 5 in the bilateral lower extremities. Deep tendon reflexes are 2+ and equal in the patella and Achilles.  Foley cath was placed without significant residual. The catheter will be left in place and she will be advised to followup with urology. She will also be given Pyridium. She is not complaining of any back pain and she has normal strength and reflexes. I doubt cauda equina or other emergent cause.   EKG Interpretation None        Geoffery Lyonsouglas Cecilia Vancleve, MD 11/13/13 1114

## 2013-11-22 ENCOUNTER — Emergency Department (HOSPITAL_COMMUNITY): Payer: Medicaid Other

## 2013-11-22 ENCOUNTER — Encounter (HOSPITAL_COMMUNITY): Payer: Self-pay | Admitting: Emergency Medicine

## 2013-11-22 ENCOUNTER — Emergency Department (HOSPITAL_COMMUNITY)
Admission: EM | Admit: 2013-11-22 | Discharge: 2013-11-22 | Disposition: A | Discharge status: Home / Self Care | Payer: Medicaid Other | Attending: Emergency Medicine | Admitting: Emergency Medicine

## 2013-11-22 DIAGNOSIS — Z862 Personal history of diseases of the blood and blood-forming organs and certain disorders involving the immune mechanism: Secondary | ICD-10-CM | POA: Insufficient documentation

## 2013-11-22 DIAGNOSIS — K219 Gastro-esophageal reflux disease without esophagitis: Secondary | ICD-10-CM | POA: Diagnosis not present

## 2013-11-22 DIAGNOSIS — Z90710 Acquired absence of both cervix and uterus: Secondary | ICD-10-CM | POA: Insufficient documentation

## 2013-11-22 DIAGNOSIS — Z9851 Tubal ligation status: Secondary | ICD-10-CM | POA: Diagnosis not present

## 2013-11-22 DIAGNOSIS — R109 Unspecified abdominal pain: Secondary | ICD-10-CM | POA: Diagnosis present

## 2013-11-22 DIAGNOSIS — Z978 Presence of other specified devices: Secondary | ICD-10-CM

## 2013-11-22 DIAGNOSIS — Z79899 Other long term (current) drug therapy: Secondary | ICD-10-CM | POA: Diagnosis not present

## 2013-11-22 DIAGNOSIS — Z3202 Encounter for pregnancy test, result negative: Secondary | ICD-10-CM | POA: Insufficient documentation

## 2013-11-22 DIAGNOSIS — Z960 Presence of urogenital implants: Secondary | ICD-10-CM

## 2013-11-22 DIAGNOSIS — Z8742 Personal history of other diseases of the female genital tract: Secondary | ICD-10-CM | POA: Insufficient documentation

## 2013-11-22 DIAGNOSIS — R110 Nausea: Secondary | ICD-10-CM | POA: Diagnosis not present

## 2013-11-22 LAB — URINALYSIS, ROUTINE W REFLEX MICROSCOPIC
Bilirubin Urine: NEGATIVE
Glucose, UA: NEGATIVE mg/dL
Ketones, ur: NEGATIVE mg/dL
Nitrite: NEGATIVE
Protein, ur: NEGATIVE mg/dL
Specific Gravity, Urine: 1.007 (ref 1.005–1.030)
Urobilinogen, UA: 0.2 mg/dL (ref 0.0–1.0)
pH: 6 (ref 5.0–8.0)

## 2013-11-22 LAB — CBC WITH DIFFERENTIAL/PLATELET
Basophils Absolute: 0 10*3/uL (ref 0.0–0.1)
Basophils Relative: 1 % (ref 0–1)
Eosinophils Absolute: 0.2 10*3/uL (ref 0.0–0.7)
Eosinophils Relative: 4 % (ref 0–5)
HCT: 32 % — ABNORMAL LOW (ref 36.0–46.0)
Hemoglobin: 9.8 g/dL — ABNORMAL LOW (ref 12.0–15.0)
Lymphocytes Relative: 50 % — ABNORMAL HIGH (ref 12–46)
Lymphs Abs: 2.4 10*3/uL (ref 0.7–4.0)
MCH: 23.5 pg — ABNORMAL LOW (ref 26.0–34.0)
MCHC: 30.6 g/dL (ref 30.0–36.0)
MCV: 76.7 fL — ABNORMAL LOW (ref 78.0–100.0)
Monocytes Absolute: 0.3 10*3/uL (ref 0.1–1.0)
Monocytes Relative: 6 % (ref 3–12)
Neutro Abs: 1.8 10*3/uL (ref 1.7–7.7)
Neutrophils Relative %: 39 % — ABNORMAL LOW (ref 43–77)
Platelets: 311 10*3/uL (ref 150–400)
RBC: 4.17 MIL/uL (ref 3.87–5.11)
RDW: 14.3 % (ref 11.5–15.5)
WBC: 4.7 10*3/uL (ref 4.0–10.5)

## 2013-11-22 LAB — COMPREHENSIVE METABOLIC PANEL
ALT: 19 U/L (ref 0–35)
AST: 22 U/L (ref 0–37)
Albumin: 3.4 g/dL — ABNORMAL LOW (ref 3.5–5.2)
Alkaline Phosphatase: 83 U/L (ref 39–117)
Anion gap: 12 (ref 5–15)
BUN: 7 mg/dL (ref 6–23)
CO2: 25 mEq/L (ref 19–32)
Calcium: 9.3 mg/dL (ref 8.4–10.5)
Chloride: 104 mEq/L (ref 96–112)
Creatinine, Ser: 0.69 mg/dL (ref 0.50–1.10)
GFR calc Af Amer: >90 mL/min (ref >90)
GFR calc non Af Amer: >90 mL/min (ref >90)
Glucose, Bld: 82 mg/dL (ref 70–99)
Potassium: 3.8 mEq/L (ref 3.7–5.3)
Sodium: 141 mEq/L (ref 137–147)
Total Bilirubin: <0.2 mg/dL — ABNORMAL LOW (ref 0.3–1.2)
Total Protein: 7.8 g/dL (ref 6.0–8.3)

## 2013-11-22 LAB — POC URINE PREG, ED: Preg Test, Ur: NEGATIVE

## 2013-11-22 LAB — URINE MICROSCOPIC-ADD ON

## 2013-11-22 MED ORDER — OXYCODONE-ACETAMINOPHEN 5-325 MG PO TABS
2.0000 | ORAL_TABLET | Freq: Once | ORAL | Status: AC
  Administered 2013-11-22: 2 via ORAL
  Filled 2013-11-22: qty 2

## 2013-11-22 MED ORDER — ONDANSETRON 8 MG PO TBDP
8.0000 mg | ORAL_TABLET | Freq: Once | ORAL | Status: AC
  Administered 2013-11-22: 8 mg via ORAL
  Filled 2013-11-22: qty 1

## 2013-11-22 MED ORDER — ONDANSETRON HCL 4 MG PO TABS: 4.0000 mg | ORAL_TABLET | Freq: Four times a day (QID) | ORAL | Status: DC

## 2013-11-22 MED ORDER — OXYCODONE-ACETAMINOPHEN 5-325 MG PO TABS: 2.0000 | ORAL_TABLET | Freq: Four times a day (QID) | ORAL | Status: DC | PRN

## 2013-11-22 NOTE — ED Notes (Signed)
Pt c/o right flank pain and noticed yesterday that her urine is gotten darker in color. Pt still has indwelling foley cath.

## 2013-11-22 NOTE — Discharge Instructions (Signed)
Flank Pain °Flank pain is pain in your side. The flank is the area of your side between your upper belly (abdomen) and your back. Pain in this area can be caused by many different things. °HOME CARE °Home care and treatment will depend on the cause of your pain. °· Rest as told by your doctor. °· Drink enough fluids to keep your pee (urine) clear or pale yellow.   °· Only take medicine as told by your doctor. °· Tell your doctor about any changes in your pain. °· Follow up with your doctor. °GET HELP RIGHT AWAY IF:  °· Your pain does not get better with medicine.   °· You have new symptoms or your symptoms get worse. °· Your pain gets worse.   °· You have belly (abdominal) pain.   °· You are short of breath.   °· You always feel sick to your stomach (nauseous).   °· You keep throwing up (vomiting).   °· You have puffiness (swelling) in your belly.   °· You feel lightheaded or you pass out (faint).   °· You have blood in your pee. °· You have a fever or lasting symptoms for more than 2-3 days. °· You have a fever and your symptoms suddenly get worse. °MAKE SURE YOU:  °· Understand these instructions. °· Will watch your condition. °· Will get help right away if you are not doing well or get worse. °Document Released: 01/30/2008 Document Revised: 01/15/2012 Document Reviewed: 12/05/2011 °Exit[employer]® Patient Information ©2015 [employer]. This information is not intended to replace advice given to you by your health care provider. Make sure you discuss any questions you have with your health care provider. ° °

## 2013-11-22 NOTE — ED Provider Notes (Signed)
CSN: [id number]     Arrival date & time 11/22/13  1630 History   First MD Initiated Contact with Patient 11/22/13 1731     Chief Complaint  Patient presents with  . Flank Pain  . discolored urine      (Consider location/radiation/quality/duration/timing/severity/associated sxs/prior Treatment) HPI Comments: Patient is a 38 year old female with history of GERD, endometriosis, anemia who presents to the emergency department with gradually worsening right flank pain for the past 3 days. She describes the pain as stabbing. Sitting makes her pain worse. She reports the only thing that has made her pain better is falling asleep. She has tried heat packs which have not improved her pain. She took Tylenol this morning. She has associated nausea without vomiting or diarrhea. Currently she has an indwelling foley catheter due to urinary retention. This was placed during her last ED visit on 7/10. Unsure etiology of urinary retention, however they did not feel as though emergent MRI was indicated at that time. Patient then went to [facility] on Thursday. They attempted to take the catheter out, however the patient could not void and it was replaced. On Thursday she noticed that her urine was becoming increasingly darker. No fevers, chills, chest pain no shortness of breath. She does have pain like this a few years ago, but cannot remember what happened.  Patient is a 38 y.o. female presenting with flank pain. The history is provided by the patient. No language interpreter was used.  Flank Pain Associated symptoms include nausea. Pertinent negatives include no chest pain, chills, fever or vomiting.    Past Medical History  Diagnosis Date  . GERD (gastroesophageal reflux disease)   . Anemia   . Constipation   . Hx of endometriosis   . Macromastia 07/2011   Past Surgical History  Procedure Laterality Date  . Salpingectomy  12/12/2003    right; with TAH  . Cesarean section  12/29/2000; 1994  .  Bladder neck reconstruction  01/14/2011    Procedure: BLADDER NECK REPAIR;  Surgeon: Reva Bores, MD;  Location: [facility];  Service: Gynecology;  Laterality: N/A;  Laparoscopic Repair of Incidental Cystotomy  . Ankle arthroscopy  12/29/2007    right; with extensive debridement  . Abdominal hysterectomy  12/12/2003  . Tubal ligation  12/29/2000  . Dilation and curettage of uterus  07/08/2003    open laparoscopy  . Repair peroneal tendons ankle  01/03/2010    repair right subluxing peroneal tendons  . Tonsillectomy  as a child  . Adenoidectomy  05/2011  . Laparoscopic salpingoopherectomy  01/14/2011    left  . Right oophorectomy      with lysis of adhesions  . Breast reduction surgery  08/05/2011    Procedure: MAMMARY REDUCTION  (BREAST);  Surgeon: Louisa Second, MD;  Location: [facility];  Service: Plastics;  Laterality: Bilateral;  bilateral   Family History  Problem Relation Age of Onset  . Diabetes Mother   . Hypertension Mother   . Thyroid disease Sister    History  Substance Use Topics  . Smoking status: Never Smoker   . Smokeless tobacco: Never Used  . Alcohol Use: No   OB History   Grav Para Term Preterm Abortions TAB SAB Ect Mult Living   2 2 2       2      Review of Systems  Constitutional: Negative for fever and chills.  Respiratory: Negative for shortness of breath.   Cardiovascular: Negative for chest pain.  Gastrointestinal: Positive for nausea. Negative for vomiting.  Genitourinary: Positive for flank pain.  All other systems reviewed and are negative.     Allergies  Other  Home Medications   Prior to Admission medications   Medication Sig Start Date End Date Taking? Authorizing Provider  acetaminophen (TYLENOL) 500 MG tablet Take 1,000 mg by mouth every 6 (six) hours as needed for mild pain.   Yes Historical Provider, MD  docusate sodium (COLACE) 100 MG capsule Take 100 mg by mouth 2 (two) times daily.   Yes Historical Provider, MD    EPINEPHrine (EPIPEN 2-PAK) 0.3 mg/0.3 mL DEVI Inject 0.3 mLs (0.3 mg total) into the muscle once as needed (for severe allergic reaction). CAll 911 immediately if you have to use this medicine 09/09/12  Yes Jennifer L Piepenbrink, PA-C  esomeprazole (NEXIUM) 40 MG capsule Take 40 mg by mouth every morning.    Yes Historical Provider, MD  ipratropium (ATROVENT) 0.06 % nasal spray Place 2 sprays into both nostrils 4 (four) times daily. 06/09/13  Yes Rodolph BongEvan S Corey, MD  loratadine (CLARITIN) 10 MG tablet Take 10 mg by mouth every morning.   Yes Historical Provider, MD  Olopatadine HCl (PATADAY) 0.2 % SOLN Place 1 drop into both eyes daily.    Yes Historical Provider, MD  ondansetron (ZOFRAN ODT) 4 MG disintegrating tablet 4mg  ODT q4 hours prn nausea/vomit 11/12/13  Yes Forrest Mort SawyersS Harrison, MD  polyethylene glycol (MIRALAX / GLYCOLAX) packet Take 17 g by mouth daily as needed (constipation).    Yes Historical Provider, MD  Soft Lens Products (REWETTING DROPS) SOLN Place 2 drops into both eyes daily as needed (FOR DRY EYES).   Yes Historical Provider, MD  triamcinolone cream (KENALOG) 0.1 % Apply 1 application topically daily as needed (for rash).   Yes Historical Provider, MD   BP 135/83  Pulse 65  Temp(Src) 98.7 F (37.1 C) (Oral)  Resp 16  SpO2 99%  LMP 12/27/2003 Physical Exam  Nursing note and vitals reviewed. Constitutional: She is oriented to person, place, and time. She appears well-developed and well-nourished. She does not appear ill. No distress.  HENT:  Head: Normocephalic and atraumatic.  Right Ear: External ear normal.  Left Ear: External ear normal.  Nose: Nose normal.  Mouth/Throat: Oropharynx is clear and moist.  Eyes: Conjunctivae are normal.  Neck: Normal range of motion.  Cardiovascular: Normal rate, regular rhythm, normal heart sounds, intact distal pulses and normal pulses.   Pulses:      Dorsalis pedis pulses are 2+ on the right side, and 2+ on the left side.        Posterior tibial pulses are 2+ on the right side, and 2+ on the left side.  Pulmonary/Chest: Effort normal and breath sounds normal. No stridor. No respiratory distress. She has no wheezes. She has no rales.  Abdominal: Soft. She exhibits no distension. There is tenderness. There is no rigidity, no rebound and no guarding.    Genitourinary:  Foley catheter in place draining yellow urine.   Musculoskeletal: Normal range of motion.       Back:  No bony tenderness Strength 5/5 in all extremities.   Neurological: She is alert and oriented to person, place, and time. She has normal strength.  Reflex Scores:      Patellar reflexes are 2+ on the right side and 2+ on the left side. Skin: Skin is warm and dry. She is not diaphoretic. No erythema.  Psychiatric: She has a normal mood and  affect. Her behavior is normal.    ED Course  Procedures (including critical care time) Labs Review Labs Reviewed  URINALYSIS, ROUTINE W REFLEX MICROSCOPIC - Abnormal; Notable for the following:    Hgb urine dipstick MODERATE (*)    Leukocytes, UA TRACE (*)    All other components within normal limits  CBC WITH DIFFERENTIAL - Abnormal; Notable for the following:    Hemoglobin 9.8 (*)    HCT 32.0 (*)    MCV 76.7 (*)    MCH 23.5 (*)    Neutrophils Relative % 39 (*)    Lymphocytes Relative 50 (*)    All other components within normal limits  COMPREHENSIVE METABOLIC PANEL - Abnormal; Notable for the following:    Albumin 3.4 (*)    Total Bilirubin <0.2 (*)    All other components within normal limits  URINE CULTURE  URINE MICROSCOPIC-ADD ON  POC URINE PREG, ED    Imaging Review US Renal  11/22/2013   CLINICAL DATA:  Right flank pain. History of hysterectomy and bladder neck reconstruction.  EXAM: RENAL/URINARY TRACT ULTRASOUND COMPLETE  COMPARISON:  Abdominal pelvic CT 11/12/2013.  FINDINGS: Right Kidney:  Length: 10.7 cm. Echogenicity within normal limits. No mass or hydronephrosis visualized.  Left  Kidney:  Length: 10.2 cm. Echogenicity within normal limits. No mass or hydronephrosis visualized.  Bladder:  Decompressed by Foley catheter.  IMPRESSION: Normal renal ultrasound. No hydronephrosis. The bladder is decompressed by a Foley catheter.   Electronically Signed   By: Roxy Horseman M.D.   On: 11/22/2013 21:47     EKG Interpretation None      MDM   Final diagnoses:  Flank pain  Foley catheter in place   Patient presents to ED with right sided flank pain. Foley catheter in place due to urinary retention. Urine is flowing well from foley it does not appear infected. Labs and imaging unremarkable. Patient is afebrile, normotensive. Patient was instructed to follow up with urology. I do not believe her urinary retention is related to cauda equina or other emergent cause given normal neuro exam. Patient feels improved after pain medications. Discussed reasons to return to ED immediately. Vital signs stable for discharge. Discussed case with Dr. Fredderick Phenix who agrees with plan. Patient / Family / Caregiver informed of clinical course, understand medical decision-making process, and agree with plan.    Mora Bellman, PA-C 11/24/13 1515

## 2013-11-23 LAB — URINE CULTURE: Colony Count: >=100000

## 2013-11-26 NOTE — ED Provider Notes (Signed)
Medical screening examination/treatment/procedure(s) were performed by non-physician practitioner and as supervising physician I was immediately available for consultation/collaboration.   EKG Interpretation None        Hajime Asfaw, MD 11/26/13 1517 

## 2013-12-02 ENCOUNTER — Encounter (HOSPITAL_COMMUNITY): Payer: Self-pay | Admitting: Emergency Medicine

## 2013-12-02 ENCOUNTER — Emergency Department (HOSPITAL_COMMUNITY)
Admission: EM | Admit: 2013-12-02 | Discharge: 2013-12-02 | Disposition: A | Discharge status: Home / Self Care | Payer: Medicaid Other | Attending: Emergency Medicine | Admitting: Emergency Medicine

## 2013-12-02 DIAGNOSIS — Z90710 Acquired absence of both cervix and uterus: Secondary | ICD-10-CM | POA: Diagnosis not present

## 2013-12-02 DIAGNOSIS — R109 Unspecified abdominal pain: Secondary | ICD-10-CM | POA: Diagnosis present

## 2013-12-02 DIAGNOSIS — Z8742 Personal history of other diseases of the female genital tract: Secondary | ICD-10-CM | POA: Insufficient documentation

## 2013-12-02 DIAGNOSIS — Z79899 Other long term (current) drug therapy: Secondary | ICD-10-CM | POA: Diagnosis not present

## 2013-12-02 DIAGNOSIS — Z9889 Other specified postprocedural states: Secondary | ICD-10-CM | POA: Diagnosis not present

## 2013-12-02 DIAGNOSIS — Z862 Personal history of diseases of the blood and blood-forming organs and certain disorders involving the immune mechanism: Secondary | ICD-10-CM | POA: Insufficient documentation

## 2013-12-02 DIAGNOSIS — K219 Gastro-esophageal reflux disease without esophagitis: Secondary | ICD-10-CM | POA: Diagnosis not present

## 2013-12-02 DIAGNOSIS — N390 Urinary tract infection, site not specified: Secondary | ICD-10-CM | POA: Insufficient documentation

## 2013-12-02 DIAGNOSIS — Z9851 Tubal ligation status: Secondary | ICD-10-CM | POA: Diagnosis not present

## 2013-12-02 LAB — I-STAT CHEM 8, ED
BUN: 7 mg/dL (ref 6–23)
Calcium, Ion: 1.17 mmol/L (ref 1.12–1.23)
Chloride: 101 mEq/L (ref 96–112)
Creatinine, Ser: 0.7 mg/dL (ref 0.50–1.10)
Glucose, Bld: 73 mg/dL (ref 70–99)
HCT: 36 % (ref 36.0–46.0)
Hemoglobin: 12.2 g/dL (ref 12.0–15.0)
Potassium: 3.7 mEq/L (ref 3.7–5.3)
Sodium: 139 mEq/L (ref 137–147)
TCO2: 28 mmol/L (ref 0–100)

## 2013-12-02 LAB — CBC
HCT: 31.5 % — ABNORMAL LOW (ref 36.0–46.0)
Hemoglobin: 9.9 g/dL — ABNORMAL LOW (ref 12.0–15.0)
MCH: 23.7 pg — ABNORMAL LOW (ref 26.0–34.0)
MCHC: 31.4 g/dL (ref 30.0–36.0)
MCV: 75.5 fL — ABNORMAL LOW (ref 78.0–100.0)
Platelets: 427 10*3/uL — ABNORMAL HIGH (ref 150–400)
RBC: 4.17 MIL/uL (ref 3.87–5.11)
RDW: 14.6 % (ref 11.5–15.5)
WBC: 8.1 10*3/uL (ref 4.0–10.5)

## 2013-12-02 LAB — URINALYSIS, ROUTINE W REFLEX MICROSCOPIC
Bilirubin Urine: NEGATIVE
Glucose, UA: NEGATIVE mg/dL
Ketones, ur: NEGATIVE mg/dL
Nitrite: POSITIVE — AB
Protein, ur: NEGATIVE mg/dL
Specific Gravity, Urine: 1.009 (ref 1.005–1.030)
Urobilinogen, UA: 0.2 mg/dL (ref 0.0–1.0)
pH: 5.5 (ref 5.0–8.0)

## 2013-12-02 LAB — URINE MICROSCOPIC-ADD ON

## 2013-12-02 MED ORDER — OXYCODONE-ACETAMINOPHEN 5-325 MG PO TABS: 1.0000 | ORAL_TABLET | ORAL | Status: DC | PRN

## 2013-12-02 MED ORDER — LIDOCAINE HCL 1 % IJ SOLN
INTRAMUSCULAR | Status: AC
  Administered 2013-12-02: 20 mL
  Filled 2013-12-02: qty 20

## 2013-12-02 MED ORDER — OXYCODONE-ACETAMINOPHEN 5-325 MG PO TABS
1.0000 | ORAL_TABLET | Freq: Once | ORAL | Status: AC
  Administered 2013-12-02: 1 via ORAL
  Filled 2013-12-02: qty 1

## 2013-12-02 MED ORDER — CEFTRIAXONE SODIUM 1 G IJ SOLR
1.0000 g | Freq: Once | INTRAMUSCULAR | Status: AC
  Administered 2013-12-02: 1 g via INTRAMUSCULAR
  Filled 2013-12-02: qty 10

## 2013-12-02 MED ORDER — LIDOCAINE HCL 2 % EX GEL
1.0000 "application " | Freq: Once | CUTANEOUS | Status: AC
  Administered 2013-12-02: 1 via URETHRAL
  Filled 2013-12-02: qty 10

## 2013-12-02 MED ORDER — LIDOCAINE HCL 2 % EX GEL: 1.0000 "application " | CUTANEOUS | Status: DC | PRN

## 2013-12-02 MED ORDER — CEPHALEXIN 500 MG PO CAPS: 500.0000 mg | ORAL_CAPSULE | Freq: Three times a day (TID) | ORAL | Status: DC

## 2013-12-02 NOTE — ED Notes (Signed)
Patient is alert and oriented x3.  She was given DC instructions and follow up visit instructions.  Patient gave verbal understanding. She was DC ambulatory under her own power to home.  V/S stable.  He was not showing any signs of distress on DC 

## 2013-12-02 NOTE — ED Notes (Signed)
Pt states she has a urinary catheter in place and states they have been trying to take it out for about a month but she is unable to void on her own   Pt states she has an appt with her dr next week but she is having pain in her right flank area  Pt states she is also having a lot of pain where her catheter is inserted

## 2013-12-02 NOTE — ED Provider Notes (Signed)
CSN: [id number]     Arrival date & time 12/02/13  1854 History   First MD Initiated Contact with Patient 12/02/13 2052     Chief Complaint  Patient presents with  . Flank Pain     (Consider location/radiation/quality/duration/timing/severity/associated sxs/prior Treatment) The history is provided by the patient and medical records.   This is a 38 y.o. F with PMH significant for GERD, anemia, endometriosis status post hysterectomy, presenting to the ED for right flank pain. Patient states she's been having intermittent flank pain over the past month.  Describes pain as a generalized ache, worse with movement and better with lying still. Today is occuring on her right flank, but can occur on left side or bilaterally at any given time.  No hx of kidney stones.  Patient has an indwelling Foley catheter this was placed by urology. States he has been in place for the past month due to urinary retention. She had a followup appointment last week, attempted to remove catheter with void test, however she was unable to void on her own so the catheter was replaced. She states earlier today her urine appeared darker in color with pieces of sediment present. She denies any gross blood. No fever or chills. Patient also states she has pain at site of catheter placement along her urethral opening.  Denies bleeding or urine leaking around catheter tubing.  Patient states they have been unable to figure out why she has developed urinary retention as she had no problems with this until recently. She denies any back pain, numbness, or paresthesias of lower extremities.  Past Medical History  Diagnosis Date  . GERD (gastroesophageal reflux disease)   . Anemia   . Constipation   . Hx of endometriosis   . Macromastia 07/2011   Past Surgical History  Procedure Laterality Date  . Salpingectomy  12/12/2003    right; with TAH  . Cesarean section  12/29/2000; 1994  . Bladder neck reconstruction  01/14/2011    Procedure:  BLADDER NECK REPAIR;  Surgeon: Reva Boresanya S Pratt, MD;  Location: [facility];  Service: Gynecology;  Laterality: N/A;  Laparoscopic Repair of Incidental Cystotomy  . Ankle arthroscopy  12/29/2007    right; with extensive debridement  . Abdominal hysterectomy  12/12/2003  . Tubal ligation  12/29/2000  . Dilation and curettage of uterus  07/08/2003    open laparoscopy  . Repair peroneal tendons ankle  01/03/2010    repair right subluxing peroneal tendons  . Tonsillectomy  as a child  . Adenoidectomy  05/2011  . Laparoscopic salpingoopherectomy  01/14/2011    left  . Right oophorectomy      with lysis of adhesions  . Breast reduction surgery  08/05/2011    Procedure: MAMMARY REDUCTION  (BREAST);  Surgeon: Louisa SecondGerald Truesdale, MD;  Location: [facility];  Service: Plastics;  Laterality: Bilateral;  bilateral   Family History  Problem Relation Age of Onset  . Diabetes Mother   . Hypertension Mother   . Thyroid disease Sister    History  Substance Use Topics  . Smoking status: Never Smoker   . Smokeless tobacco: Never Used  . Alcohol Use: No   OB History   Grav Para Term Preterm Abortions TAB SAB Ect Mult Living   2 2 2       2      Review of Systems  Genitourinary: Positive for flank pain and difficulty urinating.  All other systems reviewed and are negative.     Allergies  Other  Home Medications   Prior to Admission medications   Medication Sig Start Date End Date Taking? Authorizing Provider  acetaminophen (TYLENOL) 500 MG tablet Take 1,000 mg by mouth every 6 (six) hours as needed for mild pain.   Yes Historical Provider, MD  docusate sodium (COLACE) 100 MG capsule Take 100 mg by mouth 2 (two) times daily.   Yes Historical Provider, MD  EPINEPHrine (EPIPEN 2-PAK) 0.3 mg/0.3 mL DEVI Inject 0.3 mLs (0.3 mg total) into the muscle once as needed (for severe allergic reaction). CAll 911 immediately if you have to use this medicine 09/09/12  Yes Jennifer L Piepenbrink, PA-C   esomeprazole (NEXIUM) 40 MG capsule Take 40 mg by mouth every morning.    Yes Historical Provider, MD  loratadine (CLARITIN) 10 MG tablet Take 10 mg by mouth every morning.   Yes Historical Provider, MD  Olopatadine HCl (PATADAY) 0.2 % SOLN Place 1 drop into both eyes daily.    Yes Historical Provider, MD  ondansetron (ZOFRAN) 4 MG tablet Take 1 tablet (4 mg total) by mouth every 6 (six) hours. 11/22/13  Yes Mora Bellman, PA-C  polyethylene glycol (MIRALAX / GLYCOLAX) packet Take 17 g by mouth daily as needed (constipation).    Yes Historical Provider, MD  Soft Lens Products (REWETTING DROPS) SOLN Place 2 drops into both eyes daily as needed (FOR DRY EYES).   Yes Historical Provider, MD  triamcinolone cream (KENALOG) 0.1 % Apply 1 application topically daily as needed (for rash).   Yes Historical Provider, MD   BP 118/75  Pulse 79  Temp(Src) 98.5 F (36.9 C) (Oral)  Resp 18  SpO2 99%  LMP 12/27/2003  Physical Exam  Nursing note and vitals reviewed. Constitutional: She is oriented to person, place, and time. She appears well-developed and well-nourished.  HENT:  Head: Normocephalic and atraumatic.  Mouth/Throat: Oropharynx is clear and moist.  Eyes: Conjunctivae and EOM are normal. Pupils are equal, round, and reactive to light.  Neck: Normal range of motion.  Cardiovascular: Normal rate, regular rhythm and normal heart sounds.   Pulmonary/Chest: Effort normal and breath sounds normal.  Abdominal: Soft. Bowel sounds are normal. There is no tenderness. There is no rigidity and no guarding.  Endorses pain of right flank without focal tenderness on exam  Genitourinary:  indwelling foley in correct position, urethral opening clean without erythema, induration, or signs of infection; no urine leaking around catheter tubing  Musculoskeletal: Normal range of motion.  Neurological: She is alert and oriented to person, place, and time.  Skin: Skin is warm and dry.  Psychiatric: She has a  normal mood and affect.    ED Course  Procedures (including critical care time) Labs Review Labs Reviewed  URINALYSIS, ROUTINE W REFLEX MICROSCOPIC - Abnormal; Notable for the following:    Hgb urine dipstick TRACE (*)    Nitrite POSITIVE (*)    Leukocytes, UA TRACE (*)    All other components within normal limits  CBC - Abnormal; Notable for the following:    Hemoglobin 9.9 (*)    HCT 31.5 (*)    MCV 75.5 (*)    MCH 23.7 (*)    Platelets 427 (*)    All other components within normal limits  URINE CULTURE  URINE MICROSCOPIC-ADD ON  I-STAT CHEM 8, ED    Imaging Review No results found.   EKG Interpretation None      MDM   Final diagnoses:  UTI (lower urinary tract infection)  Flank  pain   38 y.o. F with flank pain and urine discoloration.  Indwelling foley due to urinary retention for the past month, unable to void normally at urology last week.  On exam, afebrile and overall non-toxic appearing.  Endorses pain of right flank, right CVA non-tender.  Complaining of some discomfort at urethral opening, however appears normal on exam.  Good urine output present in leg bag.  Will obtain u/a, basic labs.  Percocet given and lidocaine jelly applied to urethra for comfort.  Labs obtained, renal function preserved.  U/a nitrite +, culture pending.  1g Rocephin given.  Pt has normal urine output and recent renal u/s on 11/24/13 which was normal, do not feel repeat imaging needed at this time.  Will d/c home on keflex pending urine culture.  Will FU with urology next week as previously scheduled.  Discussed plan with patient, he/she acknowledged understanding and agreed with plan of care.  Return precautions given for new or worsening symptoms.  Garlon Hatchet, PA-C 12/02/13 2345

## 2013-12-02 NOTE — ED Notes (Addendum)
Initial contact-pt A&Ox4. Ambulatory and moving all extremities equally. Reports difficulty urinating start 10/09/2013. No hx urinary problems before this. Had urinary catheter placed beginning of this month. Sees a urologist regularly. Per pt, "the urologist tried to take it out last week but I was in so much pain two hours later that I had to go back in and have the catheter put back in. They don't know why I'm having this problem. I've noticed my urine has looked brown the last few nights and it smells worse than usual." Is not currently taking any prescribed medications or antibiotics. Has a urology appt next week. Denies leaking urine around catheter insertion site. In NAD. Awaiting MD.

## 2013-12-02 NOTE — ED Notes (Signed)
Patient's catheter balloon completely deflated then re-inflated after re-inserting catheter to the hub. Lidocaine jelly applied externally. Per PA request. Patient tolerated well.

## 2013-12-02 NOTE — Discharge Instructions (Signed)
Take the prescribed medication as directed.  Apply lidocaine jelly topically as needed. Follow-up with urology next week as previously arranged. Return to the ED for new or worsening symptoms.

## 2013-12-03 NOTE — ED Provider Notes (Signed)
Medical screening examination/treatment/procedure(s) were performed by non-physician practitioner and as supervising physician I was immediately available for consultation/collaboration.   EKG Interpretation None       Hurman HornJohn M Amorina Doerr, MD 12/03/13 2050

## 2013-12-04 LAB — URINE CULTURE: Colony Count: >=100000

## 2013-12-15 ENCOUNTER — Emergency Department (HOSPITAL_COMMUNITY)
Admission: EM | Admit: 2013-12-15 | Discharge: 2013-12-15 | Disposition: A | Discharge status: Home / Self Care | Payer: Medicaid Other | Attending: Emergency Medicine | Admitting: Emergency Medicine

## 2013-12-15 ENCOUNTER — Encounter (HOSPITAL_COMMUNITY): Payer: Self-pay | Admitting: Emergency Medicine

## 2013-12-15 DIAGNOSIS — R1084 Generalized abdominal pain: Secondary | ICD-10-CM | POA: Diagnosis not present

## 2013-12-15 DIAGNOSIS — Z79899 Other long term (current) drug therapy: Secondary | ICD-10-CM | POA: Insufficient documentation

## 2013-12-15 DIAGNOSIS — K5900 Constipation, unspecified: Secondary | ICD-10-CM | POA: Insufficient documentation

## 2013-12-15 DIAGNOSIS — R350 Frequency of micturition: Secondary | ICD-10-CM | POA: Insufficient documentation

## 2013-12-15 DIAGNOSIS — K219 Gastro-esophageal reflux disease without esophagitis: Secondary | ICD-10-CM | POA: Diagnosis not present

## 2013-12-15 DIAGNOSIS — Z792 Long term (current) use of antibiotics: Secondary | ICD-10-CM | POA: Insufficient documentation

## 2013-12-15 DIAGNOSIS — IMO0002 Reserved for concepts with insufficient information to code with codable children: Secondary | ICD-10-CM | POA: Insufficient documentation

## 2013-12-15 DIAGNOSIS — R300 Dysuria: Secondary | ICD-10-CM | POA: Insufficient documentation

## 2013-12-15 DIAGNOSIS — R112 Nausea with vomiting, unspecified: Secondary | ICD-10-CM

## 2013-12-15 DIAGNOSIS — Z8742 Personal history of other diseases of the female genital tract: Secondary | ICD-10-CM | POA: Diagnosis not present

## 2013-12-15 DIAGNOSIS — Z862 Personal history of diseases of the blood and blood-forming organs and certain disorders involving the immune mechanism: Secondary | ICD-10-CM | POA: Insufficient documentation

## 2013-12-15 DIAGNOSIS — R1030 Lower abdominal pain, unspecified: Secondary | ICD-10-CM

## 2013-12-15 DIAGNOSIS — Z3202 Encounter for pregnancy test, result negative: Secondary | ICD-10-CM | POA: Insufficient documentation

## 2013-12-15 DIAGNOSIS — R339 Retention of urine, unspecified: Secondary | ICD-10-CM | POA: Insufficient documentation

## 2013-12-15 LAB — CBC WITH DIFFERENTIAL/PLATELET
Basophils Absolute: 0 10*3/uL (ref 0.0–0.1)
Basophils Relative: 1 % (ref 0–1)
Eosinophils Absolute: 0.2 10*3/uL (ref 0.0–0.7)
Eosinophils Relative: 4 % (ref 0–5)
HCT: 33.7 % — ABNORMAL LOW (ref 36.0–46.0)
Hemoglobin: 10.4 g/dL — ABNORMAL LOW (ref 12.0–15.0)
Lymphocytes Relative: 34 % (ref 12–46)
Lymphs Abs: 1.8 10*3/uL (ref 0.7–4.0)
MCH: 23.2 pg — ABNORMAL LOW (ref 26.0–34.0)
MCHC: 30.9 g/dL (ref 30.0–36.0)
MCV: 75.2 fL — ABNORMAL LOW (ref 78.0–100.0)
Monocytes Absolute: 0.4 10*3/uL (ref 0.1–1.0)
Monocytes Relative: 8 % (ref 3–12)
Neutro Abs: 2.8 10*3/uL (ref 1.7–7.7)
Neutrophils Relative %: 53 % (ref 43–77)
Platelets: 339 10*3/uL (ref 150–400)
RBC: 4.48 MIL/uL (ref 3.87–5.11)
RDW: 14.9 % (ref 11.5–15.5)
WBC: 5.3 10*3/uL (ref 4.0–10.5)

## 2013-12-15 LAB — COMPREHENSIVE METABOLIC PANEL
ALT: 15 U/L (ref 0–35)
AST: 19 U/L (ref 0–37)
Albumin: 3.7 g/dL (ref 3.5–5.2)
Alkaline Phosphatase: 90 U/L (ref 39–117)
Anion gap: 10 (ref 5–15)
BUN: 8 mg/dL (ref 6–23)
CO2: 28 mEq/L (ref 19–32)
Calcium: 9.3 mg/dL (ref 8.4–10.5)
Chloride: 101 mEq/L (ref 96–112)
Creatinine, Ser: 1.07 mg/dL (ref 0.50–1.10)
GFR calc Af Amer: 75 mL/min — ABNORMAL LOW (ref >90)
GFR calc non Af Amer: 65 mL/min — ABNORMAL LOW (ref >90)
Glucose, Bld: 104 mg/dL — ABNORMAL HIGH (ref 70–99)
Potassium: 3.9 mEq/L (ref 3.7–5.3)
Sodium: 139 mEq/L (ref 137–147)
Total Bilirubin: <0.2 mg/dL — ABNORMAL LOW (ref 0.3–1.2)
Total Protein: 7.9 g/dL (ref 6.0–8.3)

## 2013-12-15 LAB — URINALYSIS, ROUTINE W REFLEX MICROSCOPIC
Bilirubin Urine: NEGATIVE
Glucose, UA: NEGATIVE mg/dL
Hgb urine dipstick: NEGATIVE
Ketones, ur: NEGATIVE mg/dL
Nitrite: NEGATIVE
Protein, ur: NEGATIVE mg/dL
Specific Gravity, Urine: 1.015 (ref 1.005–1.030)
Urobilinogen, UA: 0.2 mg/dL (ref 0.0–1.0)
pH: 6.5 (ref 5.0–8.0)

## 2013-12-15 LAB — URINE MICROSCOPIC-ADD ON

## 2013-12-15 LAB — LIPASE, BLOOD: Lipase: 23 U/L (ref 11–59)

## 2013-12-15 LAB — POC URINE PREG, ED: Preg Test, Ur: NEGATIVE

## 2013-12-15 MED ORDER — SODIUM CHLORIDE 0.9 % IV BOLUS (SEPSIS)
1000.0000 mL | Freq: Once | INTRAVENOUS | Status: AC
  Administered 2013-12-15: 1000 mL via INTRAVENOUS

## 2013-12-15 MED ORDER — OXYCODONE-ACETAMINOPHEN 5-325 MG PO TABS: 1.0000 | ORAL_TABLET | Freq: Four times a day (QID) | ORAL | Status: DC | PRN

## 2013-12-15 MED ORDER — PHENAZOPYRIDINE HCL 200 MG PO TABS
200.0000 mg | ORAL_TABLET | Freq: Once | ORAL | Status: AC
  Administered 2013-12-15: 200 mg via ORAL
  Filled 2013-12-15: qty 1

## 2013-12-15 MED ORDER — ONDANSETRON HCL 4 MG/2ML IJ SOLN
4.0000 mg | Freq: Once | INTRAMUSCULAR | Status: AC
  Administered 2013-12-15: 4 mg via INTRAVENOUS
  Filled 2013-12-15: qty 2

## 2013-12-15 MED ORDER — MORPHINE SULFATE 4 MG/ML IJ SOLN
4.0000 mg | Freq: Once | INTRAMUSCULAR | Status: AC
  Administered 2013-12-15: 4 mg via INTRAVENOUS
  Filled 2013-12-15: qty 1

## 2013-12-15 MED ORDER — ONDANSETRON 4 MG PO TBDP: 4.0000 mg | ORAL_TABLET | Freq: Three times a day (TID) | ORAL | Status: DC | PRN

## 2013-12-15 MED ORDER — HYDROMORPHONE HCL PF 1 MG/ML IJ SOLN
0.5000 mg | Freq: Once | INTRAMUSCULAR | Status: AC
  Administered 2013-12-15: 0.5 mg via INTRAVENOUS
  Filled 2013-12-15: qty 1

## 2013-12-15 NOTE — ED Provider Notes (Signed)
CSN: [id number]     Arrival date & time 12/15/13  0550 History   First MD Initiated Contact with Patient 12/15/13 [phone]     Chief Complaint  Patient presents with  . Urinary Retention     (Consider location/radiation/quality/duration/timing/severity/associated sxs/prior Treatment) HPI Comments: [name] is a 38 y.o. Female with a PMHx of GERD, anemia, constipation, endometriosis, and urinary retention and UTIs in the last month, presenting to the ED today with recurrent urinary retention and increased urinary frequency/urgency. She states that on Monday she was seen at [facility] urology, had her foley removed and passed her void challenge. That night she had some abd pain but was able to urinate with relief. Tuesday she developed worsening urgency, frequency, but very little urine production when she attempted to void, and worsening abd pain. States the pain is 8/10, constant, generalized throughout but mostly located in the lower abd and right side abdominal wall, nonradiating, worsening with more time passing without being able to void. States she was able to urinate "just a little" last night, and she did not have any burning when she urinated. Has not tried any medications for this pain. Associated symptoms include N/V, nonbloody nonbilious emesis. Denies fevers, chills, CP, SOB, hematemesis, diarrhea, constipation, melena, hematochezia, hematuria, dysuria, vaginal discharge/bleeding/odors/itching, myalgias, arthralgias, flank pain, dizziness, syncope, or weakness. No sick contacts, no changes in medications. Reports not drinking much water yesterday. Still passing gas. Denies back pain, injury, or cauda equina symptoms. Denies hx of DM.  Patient is a 38 y.o. female presenting with frequency. The history is provided by the patient. No language interpreter was used.  Urinary Frequency This is a recurrent problem. The current episode started yesterday. The problem occurs constantly. The problem  has been unchanged. Associated symptoms include abdominal pain (generalized), nausea, urinary symptoms and vomiting. Pertinent negatives include no arthralgias, change in bowel habit, chest pain, chills, diaphoresis, fever, headaches, myalgias, numbness, rash or weakness. Nothing aggravates the symptoms. She has tried nothing for the symptoms. The treatment provided no relief.    Past Medical History  Diagnosis Date  . GERD (gastroesophageal reflux disease)   . Anemia   . Constipation   . Hx of endometriosis   . Macromastia 07/2011   Past Surgical History  Procedure Laterality Date  . Salpingectomy  12/12/2003    right; with TAH  . Cesarean section  12/29/2000; 1994  . Bladder neck reconstruction  01/14/2011    Procedure: BLADDER NECK REPAIR;  Surgeon: Reva Bores, MD;  Location: [facility];  Service: Gynecology;  Laterality: N/A;  Laparoscopic Repair of Incidental Cystotomy  . Ankle arthroscopy  12/29/2007    right; with extensive debridement  . Abdominal hysterectomy  12/12/2003  . Tubal ligation  12/29/2000  . Dilation and curettage of uterus  07/08/2003    open laparoscopy  . Repair peroneal tendons ankle  01/03/2010    repair right subluxing peroneal tendons  . Tonsillectomy  as a child  . Adenoidectomy  05/2011  . Laparoscopic salpingoopherectomy  01/14/2011    left  . Right oophorectomy      with lysis of adhesions  . Breast reduction surgery  08/05/2011    Procedure: MAMMARY REDUCTION  (BREAST);  Surgeon: Louisa Second, MD;  Location: [facility];  Service: Plastics;  Laterality: Bilateral;  bilateral   Family History  Problem Relation Age of Onset  . Diabetes Mother   . Hypertension Mother   . Thyroid disease Sister    History  Substance Use Topics  . Smoking status: Never Smoker   . Smokeless tobacco: Never Used  . Alcohol Use: No   OB History   Grav Para Term Preterm Abortions TAB SAB Ect Mult Living   2 2 2       2      Review of Systems  Constitutional:  Negative for fever, chills and diaphoresis.  Respiratory: Negative for shortness of breath.   Cardiovascular: Negative for chest pain.  Gastrointestinal: Positive for nausea, vomiting and abdominal pain (generalized). Negative for diarrhea, constipation, blood in stool, abdominal distention and change in bowel habit.  Endocrine: Negative for polyuria.  Genitourinary: Positive for frequency, decreased urine volume and difficulty urinating. Negative for dysuria, urgency, hematuria, flank pain, vaginal bleeding, vaginal discharge, vaginal pain, menstrual problem and pelvic pain.  Musculoskeletal: Negative for arthralgias, back pain and myalgias.  Skin: Negative for rash.  Neurological: Negative for dizziness, syncope, weakness, light-headedness, numbness and headaches.  Psychiatric/Behavioral: Negative for confusion.  10 Systems reviewed and are negative for acute change except as noted in the HPI.     Allergies  Other  Home Medications   Prior to Admission medications   Medication Sig Start Date End Date Taking? Authorizing Provider  acetaminophen (TYLENOL) 500 MG tablet Take 1,000 mg by mouth every 6 (six) hours as needed for mild pain.    Historical Provider, MD  cephALEXin (KEFLEX) 500 MG capsule Take 1 capsule (500 mg total) by mouth 3 (three) times daily. 12/02/13   Garlon Hatchet, PA-C  docusate sodium (COLACE) 100 MG capsule Take 100 mg by mouth 2 (two) times daily.    Historical Provider, MD  EPINEPHrine (EPIPEN 2-PAK) 0.3 mg/0.3 mL DEVI Inject 0.3 mLs (0.3 mg total) into the muscle once as needed (for severe allergic reaction). CAll 911 immediately if you have to use this medicine 09/09/12   Victorino Dike L Piepenbrink, PA-C  esomeprazole (NEXIUM) 40 MG capsule Take 40 mg by mouth every morning.     Historical Provider, MD  lidocaine (XYLOCAINE JELLY) 2 % jelly Place 1 application into the urethra as needed. Apply topically as needed for discomfort. 12/02/13   Garlon Hatchet, PA-C   loratadine (CLARITIN) 10 MG tablet Take 10 mg by mouth every morning.    Historical Provider, MD  Olopatadine HCl (PATADAY) 0.2 % SOLN Place 1 drop into both eyes daily.     Historical Provider, MD  ondansetron (ZOFRAN ODT) 4 MG disintegrating tablet Take 1 tablet (4 mg total) by mouth every 8 (eight) hours as needed for nausea or vomiting. 12/15/13   Donnita Falls Camprubi-Soms, PA-C  ondansetron (ZOFRAN) 4 MG tablet Take 1 tablet (4 mg total) by mouth every 6 (six) hours. 11/22/13   Mora Bellman, PA-C  oxyCODONE-acetaminophen (PERCOCET) 5-325 MG per tablet Take 1-2 tablets by mouth every 6 (six) hours as needed for severe pain. 12/15/13   Aayat Hajjar Strupp Camprubi-Soms, PA-C  oxyCODONE-acetaminophen (PERCOCET/ROXICET) 5-325 MG per tablet Take 1 tablet by mouth every 4 (four) hours as needed. 12/02/13   Garlon Hatchet, PA-C  polyethylene glycol [facility] Ethelene Hal) packet Take 17 g by mouth daily as needed (constipation).     Historical Provider, MD  Soft Lens Products (REWETTING DROPS) SOLN Place 2 drops into both eyes daily as needed (FOR DRY EYES).    Historical Provider, MD  triamcinolone cream (KENALOG) 0.1 % Apply 1 application topically daily as needed (for rash).    Historical Provider, MD   BP 113/58  Pulse 65  Temp(Src) 97.9 F (36.6 C) (Oral)  Resp 17  Ht 5\' 5"  (1.651 m)  Wt 230 lb (104.327 kg)  BMI 38.27 kg/m2  SpO2 100%  LMP 12/27/2003 Physical Exam  Nursing note and vitals reviewed. Constitutional: She is oriented to person, place, and time. Vital signs are normal. She appears well-developed and well-nourished. No distress.  VSS, NAD  HENT:  Head: Normocephalic and atraumatic.  Mouth/Throat: Oropharynx is clear and moist. Mucous membranes are dry.  Mildly dry mucous membranes  Eyes: Conjunctivae and EOM are normal. Pupils are equal, round, and reactive to light. Right eye exhibits no discharge. Left eye exhibits no discharge.  Neck: Normal range of motion. Neck supple.   Cardiovascular: Normal rate, regular rhythm, normal heart sounds and intact distal pulses.   No murmur heard. Pulmonary/Chest: Effort normal and breath sounds normal. No respiratory distress. She has no decreased breath sounds. She has no wheezes. She has no rhonchi. She has no rales.  Abdominal: Soft. Normal appearance and bowel sounds are normal. She exhibits distension. There is generalized tenderness. There is no rigidity, no rebound, no guarding, no CVA tenderness and no tenderness at McBurney's point.  Obese, soft, minimally distended although obscured by body habitus, with generalized TTP and no r/g/r, no CVA TTP. Unable to assess dullness or tympany to percussion secondary to body habitus  Musculoskeletal: Normal range of motion.  Neurological: She is alert and oriented to person, place, and time.  Skin: Skin is warm, dry and intact. No rash noted.  Psychiatric: She has a normal mood and affect.    ED Course  Procedures (including critical care time) Labs Review Labs Reviewed  CBC WITH DIFFERENTIAL - Abnormal; Notable for the following:    Hemoglobin 10.4 (*)    HCT 33.7 (*)    MCV 75.2 (*)    MCH 23.2 (*)    All other components within normal limits  COMPREHENSIVE METABOLIC PANEL - Abnormal; Notable for the following:    Glucose, Bld 104 (*)    Total Bilirubin <0.2 (*)    GFR calc non Af Amer 65 (*)    GFR calc Af Amer 75 (*)    All other components within normal limits  URINALYSIS, ROUTINE W REFLEX MICROSCOPIC - Abnormal; Notable for the following:    Leukocytes, UA TRACE (*)    All other components within normal limits  URINE CULTURE  LIPASE, BLOOD  URINE MICROSCOPIC-ADD ON  POC URINE PREG, ED    Imaging Review  US Renal 11/22/2013   CLINICAL DATA:  Right flank pain. History of hysterectomy and bladder neck reconstruction.  EXAM: RENAL/URINARY TRACT ULTRASOUND COMPLETE  COMPARISON:  Abdominal pelvic CT 11/12/2013.  FINDINGS: Right Kidney:  Length: 10.7 cm.  Echogenicity within normal limits. No mass or hydronephrosis visualized.  Left Kidney:  Length: 10.2 cm. Echogenicity within normal limits. No mass or hydronephrosis visualized.  Bladder:  Decompressed by Foley catheter.  IMPRESSION: Normal renal ultrasound. No hydronephrosis. The bladder is decompressed by a Foley catheter.   Electronically Signed   By: Roxy Horseman M.D.   On: 11/22/2013 21:47   Ct Abdomen Pelvis W Contrast 11/12/2013 CLINICAL DATA: Left lower quadrant abdominal pain beginning yesterday. Difficulty urinating. EXAM: CT ABDOMEN AND PELVIS WITH CONTRAST TECHNIQUE: Multidetector CT imaging of the abdomen and pelvis was performed using the standard protocol following bolus administration of intravenous contrast. CONTRAST: OMNIPAQUE IOHEXOL 300 MG/ML SOLN COMPARISON: 02/03/2010 FINDINGS: Atelectasis and motion artifact in the lung bases. Small amount of free fluid  in the upper abdomen, mesenteric, pericolic gutters, and pelvis. Density measurements are suggestive of ascites. Focal low-attenuation lesion in the medial segment left lobe of the liver inferiorly measuring about 3 cm diameter. This appears to be a solid lesion as a cause is contour deformity on the liver surface. This lesion has been present previously. Enhancement pattern is indeterminate. Recommend elective MRI for further characterization. Surgical absence of the gallbladder. The spleen, adrenal glands, pancreas, kidneys, abdominal aorta, inferior vena cava, and retroperitoneal lymph nodes are unremarkable. Stomach is decompressed. Small bowel are decompressed. Contrast material flows through the colon without evidence of obstruction. No free air in the abdomen. Pelvis: Bladder wall is not thickened. No changes of diverticulitis. Appendix is not identified. No pelvic lymphadenopathy. No destructive bone lesions. IMPRESSION: Mild abdominal and pelvic ascites of indeterminate etiology. Indeterminate lesion in the liver. Elective MRI  is recommended for further evaluation. No evidence of bowel obstruction or bowel wall thickening. Electronically Signed By: Burman NievesWilliam Stevens M.D. On: 11/12/2013 07:01      EKG Interpretation None      MDM   Final diagnoses:  Urinary retention  Lower abdominal pain  Non-intractable vomiting with nausea, vomiting of unspecified type     38y/o female with recurrent urinary retention, states she had foley removed by urology on Monday, and passed void test, but developed retention once again yesterday. Has had increased frequency and urgency but oliguria reported. Bladder scan showing >45000mL now. Will obtain basic labs to eval renal function, and U/A to assess for infections. Pt wants to attempt to urinate before proceeding to cath. Recent CT on 11/12/13, as well as renal U/S on 11/22/13. Will hold on imaging until labs are performed, doubt repeat imaging is required at this time. Will encourage PO fluids for now, do not want to fluid overload pt now before retention is assessed. Doubt retention is related to DM or cauda equina, unclear etiology of retention but pt cared for [facility]. Doubt vaginal source of pain, will not perform pelvic now.  8:40 AM Nursing called, stated that pt requesting more pain meds. Redose morphine 4mg . States that tech did I&O already to collect urine, which was not the plan. States that pt continuously stated she couldn't urinate, was placed on bed pan but never taken to restroom. Will place foley now and contact Dr. Iona CoachMcDairmid regarding her coordination of care.  9:25 AM Pt feeling better after foley placement, no continued N/V. U/A not showing signs of UTI, upreg neg, CBC w/diff showing baseline anemia. Lipase WNL. CMP showing acutely diminished GFR, now that foley is placed will start fluids. Attempting to reach [facility]air[facility] at [facility]. He states pt should go home with foley, culture urine, and f/up with him in 2-3 days. Will let  fluids hang and reassess, then PO challenge and plan for d/c.  11:02 AM Pt tolerating PO well, appears improved, and BP improved with fluids. Pain improved. Will d/c with percocet and zofran, with f/up at [facility]. I explained the diagnosis and have given explicit precautions to return to the ER including for any other new or worsening symptoms. The patient understands and accepts the medical plan as it's been dictated and I have answered their questions. Discharge instructions concerning home care and prescriptions have been given. The patient is STABLE and is discharged to home in good condition.  BP 113/58  Pulse 65  Temp(Src) 97.9 F (36.6 C) (Oral)  Resp 17  Ht 5\' 5"  (1.651  m)  Wt 230 lb (104.327 kg)  BMI 38.27 kg/m2  SpO2 100%  LMP 12/27/2003   Meds ordered this encounter  Medications  . ondansetron (ZOFRAN) injection 4 mg    Sig:   . morphine 4 MG/ML injection 4 mg    Sig:   . phenazopyridine (PYRIDIUM) tablet 200 mg    Sig:   . morphine 4 MG/ML injection 4 mg    Sig:   . sodium chloride 0.9 % bolus 1,000 mL    Sig:   . oxyCODONE-acetaminophen (PERCOCET) 5-325 MG per tablet    Sig: Take 1-2 tablets by mouth every 6 (six) hours as needed for severe pain.    Dispense:  10 tablet    Refill:  0    Order Specific Question:  Supervising Provider    Answer:  Eber Hong D [[id number]]  . ondansetron (ZOFRAN ODT) 4 MG disintegrating tablet    Sig: Take 1 tablet (4 mg total) by mouth every 8 (eight) hours as needed for nausea or vomiting.    Dispense:  15 tablet    Refill:  0    Order Specific Question:  Supervising Provider    Answer:  Vida Roller [address] Camprubi-Soms, PA-C 12/15/13 [phone]

## 2013-12-15 NOTE — Discharge Instructions (Signed)
Stay very well hydrated with plenty of water throughout the day. Use norco for pain as needed, and zofran for nausea as needed. Follow up with [facility] L[facility] in 2-3 days for recheck of ongoing symptoms but return to ER for emergent changing or worsening of symptoms. Please seek immediate care if you develop the following: You develop back pain.  Your symptoms are no better, or worse in 3 days. There is severe back pain or lower abdominal pain.  You develop chills.  You have a fever.  There is nausea or vomiting.  There is continued burning or discomfort with urination.    Acute Urinary Retention Acute urinary retention is the temporary inability to urinate. This is an uncommon problem in women. It can be caused by:  Infection.  A side effect of a medicine.  A problem in a nearby organ that presses or squeezes on the bladder or the urethra (the tube that drains the bladder).  Psychological problems.   Surgery on your bladder, urethra, or pelvic organs that causes obstruction to the outflow of urine from your bladder. HOME CARE INSTRUCTIONS  If you are sent home with a Foley catheter and a drainage system, you will need to discuss the best course of action with your health care provider. While the catheter is in, maintain a good intake of fluids. Keep the drainage bag emptied and lower than your catheter. This is so that contaminated urine will not flow back into your bladder, which could lead to a urinary tract infection. There are two main types of drainage bags. One is a large bag that usually is used at night. It has a good capacity that will allow you to sleep through the night without having to empty it. The second type is called a leg bag. It has a smaller capacity so it needs to be emptied more frequently. However, the main advantage is that it can be attached by a leg strap and goes underneath your clothing, allowing you the freedom to move about or leave your  home. Only take over-the-counter or prescription medicines for pain, discomfort, or fever as directed by your health care provider.  SEEK MEDICAL CARE IF:  You develop a low-grade fever.  You experience spasms or leakage of urine with the spasms. SEEK IMMEDIATE MEDICAL CARE IF:   You develop chills or fever.  Your catheter stops draining urine.  Your catheter falls out.  You start to develop increased bleeding that does not respond to rest and increased fluid intake. MAKE SURE YOU:  Understand these instructions.  Will watch your condition.  Will get help right away if you are not doing well or get worse. Document Released: 04/21/2006 Document Revised: 02/10/2013 Document Reviewed: 10/01/2012 [facility] Patient Information 2015 [place] [place]. This information is not intended to replace advice given to you by your health care provider. Make sure you discuss any questions you have with your health care provider.  Nausea and Vomiting Nausea means you feel sick to your stomach. Throwing up (vomiting) is a reflex where stomach contents come out of your mouth. HOME CARE   Take medicine as told by your doctor.  Do not force yourself to eat. However, you do need to drink fluids.  If you feel like eating, eat a normal diet as told by your doctor.  Eat rice, wheat, potatoes, bread, lean meats, yogurt, fruits, and vegetables.  Avoid high-fat foods.  Drink enough fluids to keep your pee (urine) clear or pale yellow.  Ask your doctor how to replace body fluid losses (rehydrate). Signs of body fluid loss (dehydration) include:  Feeling very thirsty.  Dry lips and mouth.  Feeling dizzy.  Dark pee.  Peeing less than normal.  Feeling confused.  Fast breathing or heart rate. GET HELP RIGHT AWAY IF:   You have blood in your throw up.  You have black or bloody poop (stool).  You have a bad headache or stiff neck.  You feel confused.  You have bad belly (abdominal)  pain.  You have chest pain or trouble breathing.  You do not pee at least once every 8 hours.  You have cold, clammy skin.  You keep throwing up after 24 to 48 hours.  You have a fever. MAKE SURE YOU:   Understand these instructions.  Will watch your condition.  Will get help right away if you are not doing well or get worse. Document Released: 10/09/2007 Document Revised: 07/15/2011 Document Reviewed: 09/21/2010 [facility] Patient Information 2015 [place], [place]. This information is not intended to replace advice given to you by your health care provider. Make sure you discuss any questions you have with your health care provider.

## 2013-12-15 NOTE — ED Notes (Signed)
Attempted IV access and blood specimens x2. Both attempts unsuccessful.

## 2013-12-15 NOTE — ED Notes (Signed)
Per EMS, pt had a foley cath for urinary retention. Cath was removed last Thursday and pt has oliguria and urinary retention since the removal.

## 2013-12-15 NOTE — ED Notes (Signed)
Bladder Scan reading = 400 ml. 

## 2013-12-15 NOTE — ED Notes (Signed)
Made pt aware we need urine specimen. Pt states that she cant void at this time.

## 2013-12-15 NOTE — ED Provider Notes (Signed)
Medical screening examination/treatment/procedure(s) were performed by non-physician practitioner and as supervising physician I was immediately available for consultation/collaboration.   EKG Interpretation None     Patient seen examined and has a nonsurgical abdomen at this time. Care was discussed with her urologist in followup arranged  Toy BakerAnthony T Tema Alire, MD 12/15/13 1015

## 2013-12-15 NOTE — ED Notes (Signed)
Second Nurse attempted IV start but unsuccessful.   IV team paged.

## 2013-12-15 NOTE — ED Notes (Signed)
Bed: [facility] Expected date:  Expected time:  Means of arrival:  Comments: EMS abd pain - urinary retention

## 2013-12-16 LAB — URINE CULTURE
Colony Count: NO GROWTH
Culture: NO GROWTH
Special Requests: NORMAL

## 2013-12-16 NOTE — ED Provider Notes (Signed)
Medical screening examination/treatment/procedure(s) were performed by non-physician practitioner and as supervising physician I was immediately available for consultation/collaboration.   Sharlie Shreffler, MD 12/16/13 0808 

## 2014-03-07 ENCOUNTER — Encounter: Payer: Self-pay | Admitting: Diagnostic Neuroimaging

## 2014-03-07 ENCOUNTER — Ambulatory Visit (INDEPENDENT_AMBULATORY_CARE_PROVIDER_SITE_OTHER): Payer: Medicaid Other | Admitting: Diagnostic Neuroimaging

## 2014-03-07 ENCOUNTER — Ambulatory Visit: Payer: Medicaid Other | Admitting: Neurology

## 2014-03-07 VITALS — BP 104/77 | HR 63 | Temp 97.9°F | Ht 65.5 in | Wt 223.2 lb

## 2014-03-07 DIAGNOSIS — G43009 Migraine without aura, not intractable, without status migrainosus: Secondary | ICD-10-CM

## 2014-03-07 DIAGNOSIS — R519 Headache, unspecified: Secondary | ICD-10-CM

## 2014-03-07 DIAGNOSIS — R51 Headache: Secondary | ICD-10-CM

## 2014-03-07 MED ORDER — SUMATRIPTAN SUCCINATE 100 MG PO TABS: 100.0000 mg | ORAL_TABLET | Freq: Once | ORAL | Status: DC | PRN

## 2014-03-07 MED ORDER — TOPIRAMATE 50 MG PO TABS: 50.0000 mg | ORAL_TABLET | Freq: Every day | ORAL | Status: DC

## 2014-03-07 NOTE — Patient Instructions (Signed)
Increase topiramate to 50mg  twice a day.  Try sumatriptan as needed for severe migraine. Take at beginning of the headache.

## 2014-03-07 NOTE — Progress Notes (Signed)
[facility]  PATIENT: [name] DOB: July 13, 1975  REFERRING CLINICIAN: Odem HISTORY FROM: patient  REASON FOR VISIT: new consult    HISTORICAL  CHIEF COMPLAINT:  Chief Complaint  Patient presents with  . Headache    HISTORY OF PRESENT ILLNESS:   38 year old right-handed female here for evaluation of headaches. 2003 patient had onset of "tension headaches" which she describes as bandlike pressure in the forehead. No associated symptoms. [facility] by Dr. Terrace ArabiaYan, diagnosed with migraine. Patient was treated with medication but patient does not recall details.  Since February 03, 2014 patient reports a new type of headache consisting of right-sided severe throbbing, burning, aching pain with mild nausea. No photophobia or phonophobia. Pain radiates down the right side of her face, into her right neck and throat. Chewing aggravates the symptoms. No warning symptoms or visual changes. This is a new type of headache compared to previously. [facility] for [facility]. Patient has been on topiramate for past couple of weeks with some improvement in symptoms. Patient is having daily, multiple headaches. Each headache lasts at least one hour. Patient using Fioricet and ibuprofen as needed for breakthrough headache. Patient has sister with severe migraine headaches.  Recent changes include change in her job start time. Patient is to wake up at 6:30 in the morning. Now she has to wake up at 4:30 in the morning. However she has been unable to adjust her sleep time. Patient averaging 5 hours of sleep per night, since October 1. Previously she was sleeping 7 hours per night.   REVIEW OF SYSTEMS: Full 14 system review of systems performed and notable only for headache insomnia joint pain aching muscles not asleep incontinence anemia fatigue.  ALLERGIES: Allergies  Allergen Reactions  . Other     Seasonal  allergies     HOME MEDICATIONS: Outpatient Prescriptions Prior to Visit  Medication Sig Dispense Refill  . acetaminophen (TYLENOL) 500 MG tablet Take 1,000 mg by mouth every 6 (six) hours as needed for mild pain.    [name]. docusate sodium (COLACE) 100 MG capsule Take 100 mg by mouth 2 (two) times daily.    [name]. EPINEPHrine (EPIPEN 2-PAK) 0.3 mg/0.3 mL DEVI Inject 0.3 mLs (0.3 mg total) into the muscle once as needed (for severe allergic reaction). CAll 911 immediately if you have to use this medicine 1 Device 1  . esomeprazole (NEXIUM) 40 MG capsule Take 40 mg by mouth every morning.     . lidocaine (XYLOCAINE JELLY) 2 % jelly Place 1 application into the urethra as needed. Apply topically as needed for discomfort. 30 mL 0  . loratadine (CLARITIN) 10 MG tablet Take 10 mg by mouth every morning.    . Olopatadine HCl (PATADAY) 0.2 % SOLN Place 1 drop into both eyes daily as needed.     . polyethylene glycol (MIRALAX / GLYCOLAX) packet Take 17 g by mouth daily as needed (constipation).     . Soft Lens Products (REWETTING DROPS) SOLN Place 2 drops into both eyes daily as needed (FOR DRY EYES).    [name]. triamcinolone cream (KENALOG) 0.1 % Apply 1 application topically daily as needed (for rash).    . cephALEXin (KEFLEX) 500 MG capsule Take 1 capsule (500 mg total) by mouth 3 (three) times daily. 30 capsule 0  . ondansetron (ZOFRAN ODT) 4 MG disintegrating tablet Take 1 tablet (4 mg total) by mouth every 8 (eight) hours as  needed for nausea or vomiting. 15 tablet 0  . ondansetron (ZOFRAN) 4 MG tablet Take 1 tablet (4 mg total) by mouth every 6 (six) hours. 12 tablet 0  . oxyCODONE-acetaminophen (PERCOCET) 5-325 MG per tablet Take 1-2 tablets by mouth every 6 (six) hours as needed for severe pain. 10 tablet 0  . oxyCODONE-acetaminophen (PERCOCET/ROXICET) 5-325 MG per tablet Take 1 tablet by mouth every 4 (four) hours as needed. 15 tablet 0   No facility-administered medications prior to visit.    PAST MEDICAL  HISTORY: Past Medical History  Diagnosis Date  . GERD (gastroesophageal reflux disease)   . Anemia   . Constipation   . Hx of endometriosis   . Macromastia 07/2011    PAST SURGICAL HISTORY: Past Surgical History  Procedure Laterality Date  . Salpingectomy  12/12/2003    right; with TAH  . Cesarean section  12/29/2000; 1994  . Bladder neck reconstruction  01/14/2011    Procedure: BLADDER NECK REPAIR;  Surgeon: Reva Boresanya S Pratt, MD;  Location: [facility];  Service: Gynecology;  Laterality: N/A;  Laparoscopic Repair of Incidental Cystotomy  . Ankle arthroscopy  12/29/2007    right; with extensive debridement  . Abdominal hysterectomy  12/12/2003  . Tubal ligation  12/29/2000  . Dilation and curettage of uterus  07/08/2003    open laparoscopy  . Repair peroneal tendons ankle  01/03/2010    repair right subluxing peroneal tendons  . Tonsillectomy  as a child  . Adenoidectomy  05/2011  . Laparoscopic salpingoopherectomy  01/14/2011    left  . Right oophorectomy      with lysis of adhesions  . Breast reduction surgery  08/05/2011    Procedure: MAMMARY REDUCTION  (BREAST);  Surgeon: Louisa SecondGerald Truesdale, MD;  Location: [facility];  Service: Plastics;  Laterality: Bilateral;  bilateral    FAMILY HISTORY: Family History  Problem Relation Age of Onset  . Diabetes Mother   . Hypertension Mother   . Thyroid disease Sister     SOCIAL HISTORY:  History   Social History  . Marital Status: Divorced    Spouse Name: N/A    Number of Children: 2  . Years of Education: 12th   Occupational History  .  Other    [employer]   Social History Main Topics  . Smoking status: Never Smoker   . Smokeless tobacco: Never Used  . Alcohol Use: No  . Drug Use: No  . Sexual Activity: Not on file   Other Topics Concern  . Not on file   Social History Narrative   Patient lives at home with family.   Caffeine Use: 1 cup daily     PHYSICAL EXAM  Filed Vitals:   03/07/14 0934  BP: 104/77    Pulse: 63  Temp: 97.9 F (36.6 C)  TempSrc: Oral  Height: 5' 5.5" (1.664 m)  Weight: 223 lb 3.2 oz (101.243 kg)    Not recorded      Visual Acuity Screening   Right eye Left eye Both eyes  Without correction: 20/100 20/100   With correction:        Body mass index is 36.56 kg/(m^2).  GENERAL EXAM: Patient is in no distress; well developed, nourished and groomed; neck is supple  CARDIOVASCULAR: Regular rate and rhythm, no murmurs, no carotid bruits  NEUROLOGIC: MENTAL STATUS: awake, alert, oriented to person, place and time, recent and remote memory intact, normal attention and concentration, language fluent, comprehension intact, naming intact, fund of  knowledge appropriate CRANIAL NERVE: no papilledema on fundoscopic exam, pupils equal and reactive to light, visual fields full to confrontation, extraocular muscles intact, no nystagmus, facial sensation and strength symmetric, hearing intact, palate elevates symmetrically, uvula midline, shoulder shrug symmetric, tongue midline. MOTOR: normal bulk and tone, full strength in the BUE, BLE SENSORY: normal and symmetric to light touch, pinprick, temperature, vibration  COORDINATION: finger-nose-finger, fine finger movements normal REFLEXES: deep tendon reflexes present and symmetric GAIT/STATION: narrow based gait; able to walk on toes, heels and tandem; romberg is negative    DIAGNOSTIC DATA (LABS, IMAGING, TESTING) - I reviewed patient records, labs, notes, testing and imaging myself where available.  Lab Results  Component Value Date   WBC 5.3 12/15/2013   HGB 10.4* 12/15/2013   HCT 33.7* 12/15/2013   MCV 75.2* 12/15/2013   PLT 339 12/15/2013      Component Value Date/Time   NA 139 12/15/2013 0807   K 3.9 12/15/2013 0807   CL 101 12/15/2013 0807   CO2 28 12/15/2013 0807   GLUCOSE 104* 12/15/2013 0807   BUN 8 12/15/2013 0807   CREATININE 1.07 12/15/2013 0807   CALCIUM 9.3 12/15/2013 0807   PROT 7.9 12/15/2013  0807   ALBUMIN 3.7 12/15/2013 0807   AST 19 12/15/2013 0807   ALT 15 12/15/2013 0807   ALKPHOS 90 12/15/2013 0807   BILITOT <0.2* 12/15/2013 0807   GFRNONAA 65* 12/15/2013 0807   GFRAA 75* 12/15/2013 0807   No results found for: CHOL, HDL, LDLCALC, LDLDIRECT, TRIG, CHOLHDL No results found for: WJXB1Y Lab Results  Component Value Date   VITAMINB12 818 12/18/2008   Lab Results  Component Value Date   TSH 0.856 04/24/2007      ASSESSMENT AND PLAN  38 y.o. year old female here with new-onset headaches since 02/03/2014, suspicious for migraine without aura. Some improvement on topiramate. We'll check MRI brain to rule out secondary causes for new onset headache.  PLAN: - MRI brain - continue topirimate - add sumatriptan prn  Orders Placed This Encounter  Procedures  . MR Brain Wo Contrast   Return in about 1 month (around 04/06/2014).    Suanne Marker, MD 03/07/2014, 10:33 AM Certified in Neurology, Neurophysiology and Neuroimaging  [facility] [address], [address] [place], [place] [address] [phone]

## 2014-03-10 ENCOUNTER — Emergency Department (HOSPITAL_COMMUNITY)
Admission: EM | Admit: 2014-03-10 | Discharge: 2014-03-10 | Discharge status: Against Medical Advice | Payer: Medicaid Other | Attending: Emergency Medicine | Admitting: Emergency Medicine

## 2014-03-10 ENCOUNTER — Encounter (HOSPITAL_COMMUNITY): Payer: Self-pay | Admitting: Emergency Medicine

## 2014-03-10 DIAGNOSIS — H9209 Otalgia, unspecified ear: Secondary | ICD-10-CM | POA: Diagnosis not present

## 2014-03-10 DIAGNOSIS — R51 Headache: Secondary | ICD-10-CM

## 2014-03-10 DIAGNOSIS — Z8742 Personal history of other diseases of the female genital tract: Secondary | ICD-10-CM | POA: Insufficient documentation

## 2014-03-10 DIAGNOSIS — Z79899 Other long term (current) drug therapy: Secondary | ICD-10-CM | POA: Insufficient documentation

## 2014-03-10 DIAGNOSIS — K219 Gastro-esophageal reflux disease without esophagitis: Secondary | ICD-10-CM | POA: Insufficient documentation

## 2014-03-10 DIAGNOSIS — Z862 Personal history of diseases of the blood and blood-forming organs and certain disorders involving the immune mechanism: Secondary | ICD-10-CM | POA: Diagnosis not present

## 2014-03-10 DIAGNOSIS — G43909 Migraine, unspecified, not intractable, without status migrainosus: Secondary | ICD-10-CM | POA: Diagnosis present

## 2014-03-10 DIAGNOSIS — R519 Headache, unspecified: Secondary | ICD-10-CM

## 2014-03-10 HISTORY — DX: Migraine, unspecified, not intractable, without status migrainosus: G43.909

## 2014-03-10 MED ORDER — DIPHENHYDRAMINE HCL 50 MG/ML IJ SOLN
25.0000 mg | Freq: Once | INTRAMUSCULAR | Status: DC
  Filled 2014-03-10: qty 1

## 2014-03-10 MED ORDER — METOCLOPRAMIDE HCL 5 MG/ML IJ SOLN
10.0000 mg | Freq: Once | INTRAMUSCULAR | Status: DC
  Filled 2014-03-10: qty 2

## 2014-03-10 MED ORDER — DEXAMETHASONE SODIUM PHOSPHATE 10 MG/ML IJ SOLN
10.0000 mg | Freq: Once | INTRAMUSCULAR | Status: AC
  Administered 2014-03-10: 10 mg via INTRAMUSCULAR

## 2014-03-10 MED ORDER — DIPHENHYDRAMINE HCL 50 MG/ML IJ SOLN
25.0000 mg | Freq: Once | INTRAMUSCULAR | Status: AC
  Administered 2014-03-10: 25 mg via INTRAMUSCULAR

## 2014-03-10 MED ORDER — DEXAMETHASONE SODIUM PHOSPHATE 10 MG/ML IJ SOLN
10.0000 mg | Freq: Once | INTRAMUSCULAR | Status: DC
  Filled 2014-03-10: qty 1

## 2014-03-10 MED ORDER — SODIUM CHLORIDE 0.9 % IV BOLUS (SEPSIS): 1000.0000 mL | Freq: Once | INTRAVENOUS | Status: DC

## 2014-03-10 MED ORDER — METOCLOPRAMIDE HCL 5 MG/ML IJ SOLN
10.0000 mg | Freq: Once | INTRAMUSCULAR | Status: AC
  Administered 2014-03-10: 10 mg via INTRAMUSCULAR

## 2014-03-10 NOTE — ED Provider Notes (Signed)
CSN: [id number]     Arrival date & time 03/10/14  0907 History   First MD Initiated Contact with Patient 03/10/14 [phone]     Chief Complaint  Patient presents with  . Migraine     (Consider location/radiation/quality/duration/timing/severity/associated sxs/prior Treatment) Patient is a 38 y.o. female presenting with headaches.  Headache Pain location:  R temporal Quality:  Dull (throbbing) Radiates to:  R neck Severity currently:  8/10 Severity at highest:  9/10 Onset quality:  Gradual Duration:  3 hours Timing:  Constant Progression:  Unchanged Similar to prior headaches: yes (except that she saw flashes of light prior to this headache.)   Context comment:  Headaches have gotten progressively worse for past several weeks.  saw a neurologist a few days ago, dx with migraines.   Relieved by:  Nothing Ineffective treatments: taking topiramate and sumatriptan. Associated symptoms: ear pain, facial pain, nausea, photophobia and weakness   Associated symptoms: no fever     Past Medical History  Diagnosis Date  . GERD (gastroesophageal reflux disease)   . Anemia   . Constipation   . Hx of endometriosis   . Macromastia 07/2011  . Migraine    Past Surgical History  Procedure Laterality Date  . Salpingectomy  12/12/2003    right; with TAH  . Cesarean section  12/29/2000; 1994  . Bladder neck reconstruction  01/14/2011    Procedure: BLADDER NECK REPAIR;  Surgeon: Reva Boresanya S Pratt, MD;  Location: [facility];  Service: Gynecology;  Laterality: N/A;  Laparoscopic Repair of Incidental Cystotomy  . Ankle arthroscopy  12/29/2007    right; with extensive debridement  . Abdominal hysterectomy  12/12/2003  . Tubal ligation  12/29/2000  . Dilation and curettage of uterus  07/08/2003    open laparoscopy  . Repair peroneal tendons ankle  01/03/2010    repair right subluxing peroneal tendons  . Tonsillectomy  as a child  . Adenoidectomy  05/2011  . Laparoscopic salpingoopherectomy  01/14/2011    left  .  Right oophorectomy      with lysis of adhesions  . Breast reduction surgery  08/05/2011    Procedure: MAMMARY REDUCTION  (BREAST);  Surgeon: Louisa SecondGerald Truesdale, MD;  Location: [facility];  Service: Plastics;  Laterality: Bilateral;  bilateral   Family History  Problem Relation Age of Onset  . Diabetes Mother   . Hypertension Mother   . Thyroid disease Sister    History  Substance Use Topics  . Smoking status: Never Smoker   . Smokeless tobacco: Never Used  . Alcohol Use: No   OB History    Gravida Para Term Preterm AB TAB SAB Ectopic Multiple Living   2 2 2       2      Review of Systems  Constitutional: Negative for fever.  HENT: Positive for ear pain.   Eyes: Positive for photophobia.  Gastrointestinal: Positive for nausea.  Neurological: Positive for headaches.  All other systems reviewed and are negative.     Allergies  Other  Home Medications   Prior to Admission medications   Medication Sig Start Date End Date Taking? Authorizing Provider  acetaminophen (TYLENOL) 500 MG tablet Take 1,000 mg by mouth every 6 (six) hours as needed for mild pain.    Historical Provider, MD  ACETAMINOPHEN-BUTALBITAL 50-325 MG TABS Take 1 tablet by mouth daily as needed. 02/25/14   Historical Provider, MD  docusate sodium (COLACE) 100 MG capsule Take 100 mg by mouth 2 (two) times daily.  Historical Provider, MD  EPINEPHrine (EPIPEN 2-PAK) 0.3 mg/0.3 mL DEVI Inject 0.3 mLs (0.3 mg total) into the muscle once as needed (for severe allergic reaction). CAll 911 immediately if you have to use this medicine 09/09/12   Victorino DikeJennifer L Piepenbrink, PA-C  esomeprazole (NEXIUM) 40 MG capsule Take 40 mg by mouth every morning.     Historical Provider, MD  lidocaine (XYLOCAINE JELLY) 2 % jelly Place 1 application into the urethra as needed. Apply topically as needed for discomfort. 12/02/13   Garlon HatchetLisa M Sanders, PA-C  loratadine (CLARITIN) 10 MG tablet Take 10 mg by mouth every morning.     Historical Provider, MD  Olopatadine HCl (PATADAY) 0.2 % SOLN Place 1 drop into both eyes daily as needed.     Historical Provider, MD  polyethylene glycol (MIRALAX / GLYCOLAX) packet Take 17 g by mouth daily as needed (constipation).     Historical Provider, MD  Soft Lens Products (REWETTING DROPS) SOLN Place 2 drops into both eyes daily as needed (FOR DRY EYES).    Historical Provider, MD  SUMAtriptan (IMITREX) 100 MG tablet Take 1 tablet (100 mg total) by mouth once as needed for migraine. May repeat x 1 after 2 hours; maximum 2 tabs per day and 8 tabs per month 03/07/14   Suanne MarkerVikram R Penumalli, MD  topiramate (TOPAMAX) 50 MG tablet Take 1 tablet (50 mg total) by mouth daily. 2 tabs in a.m; 1 in afternoon 03/07/14   Suanne MarkerVikram R Penumalli, MD  triamcinolone cream (KENALOG) 0.1 % Apply 1 application topically daily as needed (for rash).    Historical Provider, MD   BP 135/76 mmHg  Pulse 86  Temp(Src) 97.9 F (36.6 C) (Oral)  Resp 16  SpO2 100%  LMP 12/27/2003 Physical Exam  Constitutional: She is oriented to person, place, and time. She appears well-developed and well-nourished. No distress.  HENT:  Head: Normocephalic and atraumatic.  Mouth/Throat: Oropharynx is clear and moist.  Eyes: Conjunctivae are normal. Pupils are equal, round, and reactive to light. No scleral icterus.  Neck: Neck supple.  Cardiovascular: Normal rate, regular rhythm, normal heart sounds and intact distal pulses.   No murmur heard. Pulmonary/Chest: Effort normal and breath sounds normal. No stridor. No respiratory distress. She has no rales.  Abdominal: Soft. Bowel sounds are normal. She exhibits no distension. There is no tenderness.  Musculoskeletal: Normal range of motion.  Neurological: She is alert and oriented to person, place, and time. She has normal strength. No cranial nerve deficit or sensory deficit. Coordination and gait normal. GCS eye subscore is 4. GCS verbal subscore is 5. GCS motor subscore is 6.  Skin:  Skin is warm and dry. No rash noted.  Psychiatric: She has a normal mood and affect. Her behavior is normal.  Nursing note and vitals reviewed.   ED Course  Procedures (including critical care time) Labs Review Labs Reviewed - No data to display  Imaging Review No results found.   EKG Interpretation None      MDM   Final diagnoses:  Nonintractable headache, unspecified chronicity pattern, unspecified headache type    38 yo female with hx of migraines presenting with a headache.  She is concerned today because she had flashes of light prior to her headache. She reports that her neurologist asked her about these symptoms a few days ago, but she has not experienced them until now.  Regarding her headache, she is moderately uncomfortable from a gradual onset migrainous headache.  History and exam not consistent  with SAH or Meningitis.  Will treat with IV metoclopramide, diphenhydramine, dexamethasone, and fluids.    Pt requested IM medications and then eloped from ED after receiving them.  I was unable to recheck her symptoms.    Warnell Forester, MD 03/10/14 [phone]

## 2014-03-10 NOTE — ED Notes (Signed)
Per pt, states migraines-saw neuro on the 2nd-states increased pain, states meds not working

## 2014-03-10 NOTE — ED Notes (Signed)
Pt came to nurses station, sts "I'm leaving there is nothing else you can do. I will have to see neurologist anyway". Asked pt to stay long enough to have EDP come back to reevaluate her, she refused. Pt advised she was leaving AMA, she voiced understanding

## 2014-03-11 ENCOUNTER — Other Ambulatory Visit: Payer: Self-pay | Admitting: Obstetrics and Gynecology

## 2014-03-11 ENCOUNTER — Encounter: Payer: Self-pay | Admitting: Nurse Practitioner

## 2014-03-11 DIAGNOSIS — N644 Mastodynia: Secondary | ICD-10-CM

## 2014-03-11 LAB — HM PAP SMEAR

## 2014-03-13 ENCOUNTER — Encounter (HOSPITAL_COMMUNITY): Payer: Self-pay | Admitting: Emergency Medicine

## 2014-03-13 ENCOUNTER — Emergency Department (HOSPITAL_COMMUNITY)
Admission: EM | Admit: 2014-03-13 | Discharge: 2014-03-14 | Disposition: A | Discharge status: Home / Self Care | Payer: Medicaid Other | Attending: Emergency Medicine | Admitting: Emergency Medicine

## 2014-03-13 ENCOUNTER — Emergency Department (HOSPITAL_COMMUNITY): Payer: Medicaid Other

## 2014-03-13 DIAGNOSIS — R200 Anesthesia of skin: Secondary | ICD-10-CM | POA: Diagnosis not present

## 2014-03-13 DIAGNOSIS — Z862 Personal history of diseases of the blood and blood-forming organs and certain disorders involving the immune mechanism: Secondary | ICD-10-CM | POA: Insufficient documentation

## 2014-03-13 DIAGNOSIS — R51 Headache: Secondary | ICD-10-CM | POA: Diagnosis present

## 2014-03-13 DIAGNOSIS — R531 Weakness: Secondary | ICD-10-CM | POA: Diagnosis not present

## 2014-03-13 DIAGNOSIS — Z79899 Other long term (current) drug therapy: Secondary | ICD-10-CM | POA: Diagnosis not present

## 2014-03-13 DIAGNOSIS — K219 Gastro-esophageal reflux disease without esophagitis: Secondary | ICD-10-CM | POA: Insufficient documentation

## 2014-03-13 DIAGNOSIS — K5900 Constipation, unspecified: Secondary | ICD-10-CM | POA: Insufficient documentation

## 2014-03-13 DIAGNOSIS — R29898 Other symptoms and signs involving the musculoskeletal system: Secondary | ICD-10-CM

## 2014-03-13 DIAGNOSIS — G8929 Other chronic pain: Secondary | ICD-10-CM

## 2014-03-13 DIAGNOSIS — Z8742 Personal history of other diseases of the female genital tract: Secondary | ICD-10-CM | POA: Insufficient documentation

## 2014-03-13 DIAGNOSIS — Z7952 Long term (current) use of systemic steroids: Secondary | ICD-10-CM | POA: Diagnosis not present

## 2014-03-13 DIAGNOSIS — G43109 Migraine with aura, not intractable, without status migrainosus: Secondary | ICD-10-CM | POA: Diagnosis not present

## 2014-03-13 DIAGNOSIS — R519 Headache, unspecified: Secondary | ICD-10-CM

## 2014-03-13 LAB — CBC
HCT: 36.9 % (ref 36.0–46.0)
Hemoglobin: 11.8 g/dL — ABNORMAL LOW (ref 12.0–15.0)
MCH: 23.8 pg — ABNORMAL LOW (ref 26.0–34.0)
MCHC: 32 g/dL (ref 30.0–36.0)
MCV: 74.5 fL — ABNORMAL LOW (ref 78.0–100.0)
Platelets: 351 10*3/uL (ref 150–400)
RBC: 4.95 MIL/uL (ref 3.87–5.11)
RDW: 14.6 % (ref 11.5–15.5)
WBC: 6.7 10*3/uL (ref 4.0–10.5)

## 2014-03-13 LAB — BASIC METABOLIC PANEL
Anion gap: 13 (ref 5–15)
BUN: 8 mg/dL (ref 6–23)
CO2: 23 mEq/L (ref 19–32)
Calcium: 9.5 mg/dL (ref 8.4–10.5)
Chloride: 106 mEq/L (ref 96–112)
Creatinine, Ser: 0.74 mg/dL (ref 0.50–1.10)
GFR calc Af Amer: >90 mL/min (ref >90)
GFR calc non Af Amer: >90 mL/min (ref >90)
Glucose, Bld: 90 mg/dL (ref 70–99)
Potassium: 3.9 mEq/L (ref 3.7–5.3)
Sodium: 142 mEq/L (ref 137–147)

## 2014-03-13 MED ORDER — DIPHENHYDRAMINE HCL 50 MG/ML IJ SOLN
25.0000 mg | Freq: Once | INTRAMUSCULAR | Status: AC
  Administered 2014-03-13: 25 mg via INTRAVENOUS
  Filled 2014-03-13: qty 1

## 2014-03-13 MED ORDER — MAGNESIUM SULFATE 50 % IJ SOLN: 1.0000 g | Freq: Once | INTRAMUSCULAR | Status: DC

## 2014-03-13 MED ORDER — SODIUM CHLORIDE 0.9 % IV BOLUS (SEPSIS)
1000.0000 mL | Freq: Once | INTRAVENOUS | Status: AC
  Administered 2014-03-13: 1000 mL via INTRAVENOUS

## 2014-03-13 MED ORDER — IOHEXOL 350 MG/ML SOLN
100.0000 mL | Freq: Once | INTRAVENOUS | Status: AC | PRN
  Administered 2014-03-13: 100 mL via INTRAVENOUS

## 2014-03-13 MED ORDER — MAGNESIUM SULFATE 2 GM/50ML IV SOLN
2.0000 g | Freq: Once | INTRAVENOUS | Status: AC
  Administered 2014-03-13: 2 g via INTRAVENOUS
  Filled 2014-03-13: qty 50

## 2014-03-13 MED ORDER — METOCLOPRAMIDE HCL 5 MG/ML IJ SOLN
10.0000 mg | Freq: Once | INTRAMUSCULAR | Status: AC
  Administered 2014-03-13: 10 mg via INTRAVENOUS
  Filled 2014-03-13: qty 2

## 2014-03-13 MED ORDER — DEXAMETHASONE SODIUM PHOSPHATE 10 MG/ML IJ SOLN
10.0000 mg | Freq: Once | INTRAMUSCULAR | Status: AC
  Administered 2014-03-13: 10 mg via INTRAVENOUS
  Filled 2014-03-13: qty 1

## 2014-03-13 MED ORDER — KETOROLAC TROMETHAMINE 30 MG/ML IJ SOLN
30.0000 mg | Freq: Once | INTRAMUSCULAR | Status: AC
  Administered 2014-03-13: 30 mg via INTRAVENOUS
  Filled 2014-03-13: qty 1

## 2014-03-13 NOTE — ED Notes (Signed)
Patient states she now has a new symptoms with her migraines. She now complains of memory lost and pressure "like swelling" in the back of her head. 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 She was seen recently, but left AMA before receiving treatment because she felt she would told nothing is wrong and she's only having a migraine.

## 2014-03-13 NOTE — ED Notes (Signed)
Pt states she had had headaches starting at the beginning of October. Has been seen for same. States she has also felt dizzy and confused recently. Alert and oriented.

## 2014-03-13 NOTE — ED Provider Notes (Signed)
CSN: [id number]     Arrival date & time 03/13/14  1733 History   First MD Initiated Contact with Patient 03/13/14 1747     Chief Complaint  Patient presents with  . Headache  . Dizziness     (Consider location/radiation/quality/duration/timing/severity/associated sxs/prior Treatment) HPI Comments: Hx of chronic headaches. Has seen Neurology and had MR ordered, but it hasn't been done yet. States headaches worsening and now having some symptoms of confusion and intermittent R arm weakness/numbness. Also stated some R facial numbness.  Patient is a 38 y.o. female presenting with headaches and dizziness. The history is provided by the patient.  Headache Pain location:  Occipital Quality:  Dull Radiates to:  Does not radiate Onset quality:  Gradual Timing:  Constant Chronicity:  Chronic Similar to prior headaches: no   Context comment:  Spontaneously Relieved by:  Nothing Worsened by:  Nothing tried Associated symptoms: dizziness   Associated symptoms: no abdominal pain, no cough, no fever and no vomiting   Dizziness Associated symptoms: headaches   Associated symptoms: no chest pain, no shortness of breath and no vomiting     Past Medical History  Diagnosis Date  . GERD (gastroesophageal reflux disease)   . Anemia   . Constipation   . Hx of endometriosis   . Macromastia 07/2011  . Migraine    Past Surgical History  Procedure Laterality Date  . Salpingectomy  12/12/2003    right; with TAH  . Cesarean section  12/29/2000; 1994  . Bladder neck reconstruction  01/14/2011    Procedure: BLADDER NECK REPAIR;  Surgeon: Reva Boresanya S Pratt, MD;  Location: [facility];  Service: Gynecology;  Laterality: N/A;  Laparoscopic Repair of Incidental Cystotomy  . Ankle arthroscopy  12/29/2007    right; with extensive debridement  . Abdominal hysterectomy  12/12/2003  . Tubal ligation  12/29/2000  . Dilation and curettage of uterus  07/08/2003    open laparoscopy  . Repair peroneal tendons ankle  01/03/2010     repair right subluxing peroneal tendons  . Tonsillectomy  as a child  . Adenoidectomy  05/2011  . Laparoscopic salpingoopherectomy  01/14/2011    left  . Right oophorectomy      with lysis of adhesions  . Breast reduction surgery  08/05/2011    Procedure: MAMMARY REDUCTION  (BREAST);  Surgeon: Louisa SecondGerald Truesdale, MD;  Location: [facility];  Service: Plastics;  Laterality: Bilateral;  bilateral   Family History  Problem Relation Age of Onset  . Diabetes Mother   . Hypertension Mother   . Thyroid disease Sister    History  Substance Use Topics  . Smoking status: Never Smoker   . Smokeless tobacco: Never Used  . Alcohol Use: No   OB History    Gravida Para Term Preterm AB TAB SAB Ectopic Multiple Living   2 2 2       2      Review of Systems  Constitutional: Negative for fever and chills.  Respiratory: Negative for cough and shortness of breath.   Cardiovascular: Negative for chest pain and leg swelling.  Gastrointestinal: Negative for vomiting and abdominal pain.  Neurological: Positive for dizziness and headaches.  All other systems reviewed and are negative.     Allergies  Other  Home Medications   Prior to Admission medications   Medication Sig Start Date End Date Taking? Authorizing Provider  acetaminophen (TYLENOL) 500 MG tablet Take 1,000 mg by mouth every 6 (six) hours as needed for mild pain (pain).  Yes Historical Provider, MD  ACETAMINOPHEN-BUTALBITAL 50-325 MG TABS Take 1 tablet by mouth daily as needed. 02/25/14  Yes Historical Provider, MD  docusate sodium (COLACE) 100 MG capsule Take 100 mg by mouth 2 (two) times daily.   Yes Historical Provider, MD  esomeprazole (NEXIUM) 40 MG capsule Take 40 mg by mouth every morning.    Yes Historical Provider, MD  lidocaine (XYLOCAINE JELLY) 2 % jelly Place 1 application into the urethra as needed. Apply topically as needed for discomfort. 12/02/13  Yes Garlon Hatchet, PA-C  loratadine (CLARITIN) 10 MG  tablet Take 10 mg by mouth every morning.   Yes Historical Provider, MD  Olopatadine HCl (PATADAY) 0.2 % SOLN Place 1 drop into both eyes daily as needed (dry eyes).    Yes Historical Provider, MD  polyethylene glycol (MIRALAX / GLYCOLAX) packet Take 17 g by mouth daily as needed (constipation).    Yes Historical Provider, MD  Soft Lens Products (REWETTING DROPS) SOLN Place 2 drops into both eyes daily as needed (dry eyes).    Yes Historical Provider, MD  SUMAtriptan (IMITREX) 100 MG tablet Take 1 tablet (100 mg total) by mouth once as needed for migraine. May repeat x 1 after 2 hours; maximum 2 tabs per day and 8 tabs per month 03/07/14  Yes Vikram R Penumalli, MD  topiramate (TOPAMAX) 50 MG tablet Take 1 tablet (50 mg total) by mouth daily. 2 tabs in a.m; 1 in afternoon Patient taking differently: Take 50-100 mg by mouth 2 (two) times daily. 2 Tablets in the morning & 1 Tablet at night 03/07/14  Yes Vikram R Penumalli, MD  EPINEPHrine (EPIPEN 2-PAK) 0.3 mg/0.3 mL DEVI Inject 0.3 mLs (0.3 mg total) into the muscle once as needed (for severe allergic reaction). CAll 911 immediately if you have to use this medicine 09/09/12   Lise Auer Piepenbrink, PA-C  triamcinolone cream (KENALOG) 0.1 % Apply 1 application topically daily as needed (for rash).    Historical Provider, MD   BP 123/86 mmHg  Pulse 80  Temp(Src) 98 F (36.7 C) (Oral)  Resp 20  SpO2 100%  LMP 12/27/2003 Physical Exam  Constitutional: She is oriented to person, place, and time. She appears well-developed and well-nourished. No distress.  HENT:  Head: Normocephalic and atraumatic.  Mouth/Throat: Oropharynx is clear and moist.  Eyes: EOM are normal. Pupils are equal, round, and reactive to light.  Neck: Normal range of motion. Neck supple.  Cardiovascular: Normal rate and regular rhythm.  Exam reveals no friction rub.   No murmur heard. Pulmonary/Chest: Effort normal and breath sounds normal. No respiratory distress. She has no  wheezes. She has no rales.  Abdominal: Soft. She exhibits no distension. There is no tenderness. There is no rebound.  Musculoskeletal: Normal range of motion. She exhibits no edema.  Neurological: She is alert and oriented to person, place, and time. A cranial nerve deficit (R face altered light touch) and sensory deficit (R arm with altered light touch sensation) is present. She exhibits abnormal muscle tone (R arm 4/5). GCS eye subscore is 4. GCS verbal subscore is 5. GCS motor subscore is 6.  Skin: She is not diaphoretic.  Nursing note and vitals reviewed.   ED Course  Procedures (including critical care time) Labs Review Labs Reviewed  CBC  BASIC METABOLIC PANEL    Imaging Review No results found.   EKG Interpretation   Date/Time:  Sunday March 13 2014 17:41:32 EST Ventricular Rate:  84 PR Interval:  138  QRS Duration: 83 QT Interval:  371 QTC Calculation: 438 R Axis:   79 Text Interpretation:  Sinus rhythm Baseline wander in lead(s) II III aVF  V3 Similar to prior Confirmed by Gwendolyn GrantWALDEN  MD, Akshar Starnes ([id number]) on 03/13/2014  5:48:04 PM      MDM   Final diagnoses:  Right arm weakness  Headache  Numbness on right side  Complicated migraine    38 year old female history of chronic headaches presents with worsening headaches and neurologic symptoms. 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 She's never had confusion with headaches before. She is also having some right arm and right face numbness tingling in right arm weakness. She's never had this with migraines before. She also has been told she has tension headaches but never told has migraines before. She was previously on Topamax for headaches with some relief. AFVSS here. Mild altered R arm and R face light touch sensation, mild R arm weakness. Will CT scan her head. Last known normal time 2 days ago, not a candidate for code stroke. This could also be related to her  migraines. CT normal. Still having persistent right-sided numbness and tingling despite the headache cocktail. Spoke with neurology who recommended CT of the head angioma with delayed venous imaging to look for possible dural sinus thrombosis or aneurysm. Dr. Amada JupiterKirkpatrick stated this is likely a copy located migraine since she's having tingling which is a positive symptom. CTs are normal. Feeling better after Toradol and magnesium. Instructed to follow-up with neurology. Has MRI scheduled. She is outside of any window for TPA if this was a stroke; I do think stroke is highly unlikely given her young age and lack of risk factors. STable for discharge.  Elwin MochaBlair Eryca Bolte, MD 03/13/14 [phone]

## 2014-03-13 NOTE — ED Notes (Signed)
Pt ambulated in hallway, pt ambulated w/o assistance, denied dizziness, stated she did feel like the headache was coming back to what it was before the medication after walking though.

## 2014-03-13 NOTE — ED Notes (Signed)
Patient transported to CT 

## 2014-03-13 NOTE — ED Notes (Signed)
MD at bedside. 

## 2014-03-14 ENCOUNTER — Telehealth: Payer: Self-pay | Admitting: Diagnostic Neuroimaging

## 2014-03-14 ENCOUNTER — Other Ambulatory Visit: Payer: Self-pay | Admitting: Obstetrics and Gynecology

## 2014-03-14 DIAGNOSIS — R42 Dizziness and giddiness: Secondary | ICD-10-CM | POA: Insufficient documentation

## 2014-03-14 DIAGNOSIS — G43909 Migraine, unspecified, not intractable, without status migrainosus: Secondary | ICD-10-CM | POA: Insufficient documentation

## 2014-03-14 NOTE — Telephone Encounter (Signed)
Called patient.  No answer.

## 2014-03-14 NOTE — Telephone Encounter (Signed)
Pt LM on VM needs appt within 2 days per visit to hospital on 03/10/14 and 03/13/14 pt was just here 03/07/14 please call pt dg

## 2014-03-15 ENCOUNTER — Other Ambulatory Visit: Payer: Medicaid Other

## 2014-03-23 ENCOUNTER — Other Ambulatory Visit: Payer: Medicaid Other

## 2014-04-05 ENCOUNTER — Ambulatory Visit
Admission: RE | Admit: 2014-04-05 | Discharge: 2014-04-05 | Disposition: A | Discharge status: Home / Self Care | Payer: Medicaid Other | Source: Ambulatory Visit | Attending: Obstetrics and Gynecology | Admitting: Obstetrics and Gynecology

## 2014-04-05 DIAGNOSIS — N644 Mastodynia: Secondary | ICD-10-CM

## 2014-04-15 ENCOUNTER — Encounter: Payer: Self-pay | Admitting: Diagnostic Neuroimaging

## 2014-04-15 ENCOUNTER — Ambulatory Visit (INDEPENDENT_AMBULATORY_CARE_PROVIDER_SITE_OTHER): Payer: Medicaid Other | Admitting: Diagnostic Neuroimaging

## 2014-04-15 VITALS — BP 113/77 | HR 61 | Ht 65.0 in | Wt 223.2 lb

## 2014-04-15 DIAGNOSIS — M542 Cervicalgia: Secondary | ICD-10-CM

## 2014-04-15 DIAGNOSIS — R32 Unspecified urinary incontinence: Secondary | ICD-10-CM

## 2014-04-15 DIAGNOSIS — R200 Anesthesia of skin: Secondary | ICD-10-CM

## 2014-04-15 DIAGNOSIS — R208 Other disturbances of skin sensation: Secondary | ICD-10-CM

## 2014-04-15 DIAGNOSIS — G43009 Migraine without aura, not intractable, without status migrainosus: Secondary | ICD-10-CM

## 2014-04-15 DIAGNOSIS — R519 Headache, unspecified: Secondary | ICD-10-CM

## 2014-04-15 DIAGNOSIS — R51 Headache: Secondary | ICD-10-CM

## 2014-04-15 MED ORDER — RIZATRIPTAN BENZOATE 10 MG PO TBDP: 10.0000 mg | ORAL_TABLET | ORAL | Status: DC | PRN

## 2014-04-15 MED ORDER — TOPIRAMATE 100 MG PO TABS: 100.0000 mg | ORAL_TABLET | Freq: Two times a day (BID) | ORAL | Status: DC

## 2014-04-15 MED ORDER — PREDNISONE 10 MG PO TABS: ORAL_TABLET | ORAL | Status: DC

## 2014-04-15 NOTE — Progress Notes (Signed)
[facility]  PATIENT: [name] DOB: [birth year]  REFERRING CLINICIAN: Odem HISTORY FROM: patient  REASON FOR VISIT: new consult    HISTORICAL  CHIEF COMPLAINT:  Chief Complaint  Patient presents with  . Follow-up    migraine    HISTORY OF PRESENT ILLNESS:   UPDATE 04/15/14: Since last visit, doing worse. More headaches. TPX not helping. Sumatriptan not helping. Went to ER for worsening HA. Had CT, CTA head/neck which were unremarkable. Now with trouble with driving directions and memory. Also reports 1 month of urinary incontinence, unexplained, back in April 2015.   PRIOR HPI (03/07/14): 38 year old right-handed female here for evaluation of headaches. 2003 patient had onset of "tension headaches" which she describes as bandlike pressure in the forehead. No associated symptoms. Patient was seen at [facility] neurologic in 2011 by Dr. Terrace Arabia, diagnosed with migraine. Patient was treated with medication but patient does not recall details. Since February 03, 2014 patient reports a new type of headache consisting of right-sided severe throbbing, burning, aching pain with mild nausea. No photophobia or phonophobia. Pain radiates down the right side of her face, into her right neck and throat. Chewing aggravates the symptoms. No warning symptoms or visual changes. This is a new type of headache compared to previously. Patient went to [facility] ER last week for evaluation. Patient has been on topiramate for past couple of weeks with some improvement in symptoms. Patient is having daily, multiple headaches. Each headache lasts at least one hour. Patient using Fioricet and ibuprofen as needed for breakthrough headache. Patient has sister with severe migraine headaches. Recent changes include change in her job start time. Patient is to wake up at 6:30 in the morning. Now she has to wake up at 4:30 in the morning. However she has been unable to adjust her sleep time.  Patient averaging 5 hours of sleep per night, since October 1. Previously she was sleeping 7 hours per night.   REVIEW OF SYSTEMS: Full 14 system review of systems performed and notable only for headache insomnia incontinence.    ALLERGIES: Allergies  Allergen Reactions  . Other     Seasonal allergies     HOME MEDICATIONS: Outpatient Prescriptions Prior to Visit  Medication Sig Dispense Refill  . acetaminophen (TYLENOL) 500 MG tablet Take 1,000 mg by mouth every 6 (six) hours as needed for mild pain (pain).     . ACETAMINOPHEN-BUTALBITAL 50-325 MG TABS Take 1 tablet by mouth daily as needed.    . docusate sodium (COLACE) 100 MG capsule Take 100 mg by mouth 2 (two) times daily.    [name] EPINEPHrine (EPIPEN 2-PAK) 0.3 mg/0.3 mL DEVI Inject 0.3 mLs (0.3 mg total) into the muscle once as needed (for severe allergic reaction). CAll 911 immediately if you have to use this medicine 1 Device 1  . esomeprazole (NEXIUM) 40 MG capsule Take 40 mg by mouth every morning.     . loratadine (CLARITIN) 10 MG tablet Take 10 mg by mouth every morning.    . polyethylene glycol (MIRALAX / GLYCOLAX) packet Take 17 g by mouth daily as needed (constipation).     . Soft Lens Products (REWETTING DROPS) SOLN Place 2 drops into both eyes daily as needed (dry eyes).     . triamcinolone cream (KENALOG) 0.1 % Apply 1 application topically daily as needed (for rash).    . SUMAtriptan (IMITREX) 100 MG tablet Take 1 tablet (100 mg total) by mouth once as needed for migraine. May repeat  x 1 after 2 hours; maximum 2 tabs per day and 8 tabs per month 8 tablet 6  . lidocaine (XYLOCAINE JELLY) 2 % jelly Place 1 application into the urethra as needed. Apply topically as needed for discomfort. (Patient not taking: Reported on 04/15/2014) 30 mL 0  . Olopatadine HCl (PATADAY) 0.2 % SOLN Place 1 drop into both eyes daily as needed (dry eyes).     . topiramate (TOPAMAX) 50 MG tablet Take 1 tablet (50 mg total) by mouth daily. 2 tabs in  a.m; 1 in afternoon (Patient not taking: Reported on 04/15/2014) 60 tablet 12   No facility-administered medications prior to visit.    PAST MEDICAL HISTORY: Past Medical History  Diagnosis Date  . GERD (gastroesophageal reflux disease)   . Anemia   . Constipation   . Hx of endometriosis   . Macromastia 07/2011  . Migraine     PAST SURGICAL HISTORY: Past Surgical History  Procedure Laterality Date  . Salpingectomy  12/12/2003    right; with TAH  . Cesarean section  12/29/2000; 1994  . Bladder neck reconstruction  01/14/2011    Procedure: BLADDER NECK REPAIR;  Surgeon: Reva Boresanya S Pratt, MD;  Location: [facility];  Service: Gynecology;  Laterality: N/A;  Laparoscopic Repair of Incidental Cystotomy  . Ankle arthroscopy  12/29/2007    right; with extensive debridement  . Abdominal hysterectomy  12/12/2003  . Tubal ligation  12/29/2000  . Dilation and curettage of uterus  07/08/2003    open laparoscopy  . Repair peroneal tendons ankle  01/03/2010    repair right subluxing peroneal tendons  . Tonsillectomy  as a child  . Adenoidectomy  05/2011  . Laparoscopic salpingoopherectomy  01/14/2011    left  . Right oophorectomy      with lysis of adhesions  . Breast reduction surgery  08/05/2011    Procedure: MAMMARY REDUCTION  (BREAST);  Surgeon: Louisa SecondGerald Truesdale, MD;  Location: [facility];  Service: Plastics;  Laterality: Bilateral;  bilateral    FAMILY HISTORY: Family History  Problem Relation Age of Onset  . Diabetes Mother   . Hypertension Mother   . Thyroid disease Sister     SOCIAL HISTORY:  History   Social History  . Marital Status: Divorced    Spouse Name: N/A    Number of Children: 2  . Years of Education: 12th   Occupational History  .  Other    [employer]   Social History Main Topics  . Smoking status: Never Smoker   . Smokeless tobacco: Never Used  . Alcohol Use: No  . Drug Use: No  . Sexual Activity: Not on file   Other Topics Concern  . Not on file    Social History Narrative   Patient lives at home with family.   Caffeine Use: 1 cup daily     PHYSICAL EXAM  Filed Vitals:   04/15/14 1025  BP: 113/77  Pulse: 61  Height: 5\' 5"  (1.651 m)  Weight: 223 lb 3.2 oz (101.243 kg)    Not recorded     No exam data present   Body mass index is 37.14 kg/(m^2).  GENERAL EXAM: Patient is in MILD DISTRESS. Well developed, nourished and groomed; neck is supple  CARDIOVASCULAR: Regular rate and rhythm, no murmurs, no carotid bruits  NEUROLOGIC: MENTAL STATUS: awake, alert, language fluent, comprehension intact, naming intact, fund of knowledge appropriate CRANIAL NERVE: no papilledema on fundoscopic exam, pupils equal and reactive to light, visual fields  full to confrontation, extraocular muscles intact, no nystagmus, facial sensation and strength symmetric, hearing intact, palate elevates symmetrically, uvula midline, shoulder shrug symmetric, tongue midline. MOTOR: normal bulk and tone, full strength in the BUE, BLE SENSORY: normal and symmetric to light touch, pinprick, temperature, vibration  COORDINATION: finger-nose-finger, fine finger movements normal REFLEXES: deep tendon reflexes present and symmetric GAIT/STATION: narrow based gait; able to tandem; romberg is negative    DIAGNOSTIC DATA (LABS, IMAGING, TESTING) - I reviewed patient records, labs, notes, testing and imaging myself where available.  Lab Results  Component Value Date   WBC 6.7 03/13/2014   HGB 11.8* 03/13/2014   HCT 36.9 03/13/2014   MCV 74.5* 03/13/2014   PLT 351 03/13/2014      Component Value Date/Time   NA 142 03/13/2014 1902   K 3.9 03/13/2014 1902   CL 106 03/13/2014 1902   CO2 23 03/13/2014 1902   GLUCOSE 90 03/13/2014 1902   BUN 8 03/13/2014 1902   CREATININE 0.74 03/13/2014 1902   CALCIUM 9.5 03/13/2014 1902   PROT 7.9 12/15/2013 0807   ALBUMIN 3.7 12/15/2013 0807   AST 19 12/15/2013 0807   ALT 15 12/15/2013 0807   ALKPHOS 90  12/15/2013 0807   BILITOT <0.2* 12/15/2013 0807   GFRNONAA >90 03/13/2014 1902   GFRAA >90 03/13/2014 1902   No results found for: CHOL, HDL, LDLCALC, LDLDIRECT, TRIG, CHOLHDL No results found for: [id number] Lab Results  Component Value Date   VITAMINB12 818 12/18/2008   Lab Results  Component Value Date   TSH 0.856 04/24/2007   I reviewed images myself and agree with interpretation. -VRP{  03/13/14 CT head - normal  03/13/14 CTA head/neck - normal   ASSESSMENT AND PLAN  38 y.o. year old female here with new-onset headaches since 02/03/2014, suspicious for migraine without aura. Some improvement on topiramate initally, but getting worse. Also with neck pain, urinary incontinence, hand numbness.    PLAN: - MRI brain (with and without), MRI cervical spine (with and without); eval for structural lesion or demyelinating disease - increase topirimate to 100mg  BID - change sumatriptan to rizatriptan; add on ibuprofen, aleve or tylenol OTC prn - prednisone dose pack   Orders Placed This Encounter  Procedures  . MR Brain W Wo Contrast  . MR Cervical Spine W Wo Contrast   Return in about 6 weeks (around 05/27/2014).    Suanne MarkerVIKRAM R. PENUMALLI, MD 04/15/2014, 11:16 AM Certified in Neurology, Neurophysiology and Neuroimaging  [facility] [address], [address] [place], [place] [id number] [phone]

## 2014-04-15 NOTE — Patient Instructions (Signed)
Increase topiramate to 100mg  twice a day.   Stop sumatriptan.  Use rizatriptan as needed for migraines.  Use ibuprofen, aleve or tylenol with rizatriptan as needed.  Start prednisone taper dose over 6 days.

## 2014-04-27 DIAGNOSIS — R209 Unspecified disturbances of skin sensation: Secondary | ICD-10-CM | POA: Insufficient documentation

## 2014-05-02 ENCOUNTER — Ambulatory Visit
Admission: RE | Admit: 2014-05-02 | Discharge: 2014-05-02 | Disposition: A | Discharge status: Home / Self Care | Payer: Medicaid Other | Source: Ambulatory Visit | Attending: Diagnostic Neuroimaging | Admitting: Diagnostic Neuroimaging

## 2014-05-02 ENCOUNTER — Encounter (INDEPENDENT_AMBULATORY_CARE_PROVIDER_SITE_OTHER): Payer: Medicaid Other | Admitting: Diagnostic Neuroimaging

## 2014-05-02 DIAGNOSIS — M542 Cervicalgia: Secondary | ICD-10-CM

## 2014-05-02 DIAGNOSIS — R51 Headache: Principal | ICD-10-CM

## 2014-05-02 DIAGNOSIS — R519 Headache, unspecified: Secondary | ICD-10-CM

## 2014-05-02 DIAGNOSIS — R32 Unspecified urinary incontinence: Secondary | ICD-10-CM

## 2014-05-02 DIAGNOSIS — R200 Anesthesia of skin: Secondary | ICD-10-CM

## 2014-05-02 MED ORDER — GADOBENATE DIMEGLUMINE 529 MG/ML IV SOLN
20.0000 mL | Freq: Once | INTRAVENOUS | Status: AC | PRN
  Administered 2014-05-02: 20 mL via INTRAVENOUS

## 2014-05-31 ENCOUNTER — Ambulatory Visit (INDEPENDENT_AMBULATORY_CARE_PROVIDER_SITE_OTHER): Payer: Medicaid Other | Admitting: Diagnostic Neuroimaging

## 2014-05-31 ENCOUNTER — Encounter: Payer: Self-pay | Admitting: Diagnostic Neuroimaging

## 2014-05-31 VITALS — BP 126/77 | HR 94 | Temp 98.3°F | Ht 64.0 in | Wt 233.6 lb

## 2014-05-31 DIAGNOSIS — G4719 Other hypersomnia: Secondary | ICD-10-CM

## 2014-05-31 DIAGNOSIS — G4700 Insomnia, unspecified: Secondary | ICD-10-CM

## 2014-05-31 DIAGNOSIS — G43011 Migraine without aura, intractable, with status migrainosus: Secondary | ICD-10-CM

## 2014-05-31 MED ORDER — AMITRIPTYLINE HCL 25 MG PO TABS: 25.0000 mg | ORAL_TABLET | Freq: Every day | ORAL | Status: DC

## 2014-05-31 NOTE — Progress Notes (Signed)
[facility]  PATIENT: [name] DOB: 03/30/1976  REFERRING CLINICIAN: Odem HISTORY FROM: patient  REASON FOR VISIT: new consult    HISTORICAL  CHIEF COMPLAINT:  Chief Complaint  Patient presents with  . Follow-up    acute intractable headaches    HISTORY OF PRESENT ILLNESS:   UPDATE 05/31/14: Since last visit, tried increased TPX  BID and rizatriptan, without relief. In fact she feels her headaches are worse. Also c/o of bilateral pre-auricular and TMJ pain with jaw movements. Still with poor sleep (trouble falling asleep, staying asleep, not refreshed when she wakes up). Thinks she had a sleep study in the past. I looked it up from 2011 (patient's name was [name]) and it was normal PSG.   UPDATE 04/15/14: Since last visit, doing worse. More headaches. TPX not helping. Sumatriptan not helping. Went to ER for worsening HA. Had CT, CTA head/neck which were unremarkable. Now with trouble with driving directions and memory. Also reports 1 month of urinary incontinence, unexplained, back in April 2015.   PRIOR HPI (03/07/14): 39 year old right-handed female here for evaluation of headaches. 2003 patient had onset of "tension headaches" which she describes as bandlike pressure in the forehead. No associated symptoms. Patient was seen at [facility] neurologic in 2011 by Dr. Terrace Arabia, diagnosed with migraine. Patient was treated with medication but patient does not recall details. Since February 03, 2014 patient reports a new type of headache consisting of right-sided severe throbbing, burning, aching pain with mild nausea. No photophobia or phonophobia. Pain radiates down the right side of her face, into her right neck and throat. Chewing aggravates the symptoms. No warning symptoms or visual changes. This is a new type of headache compared to previously. Patient went to [facility] ER last week for evaluation. Patient has been on topiramate for past couple  of weeks with some improvement in symptoms. Patient is having daily, multiple headaches. Each headache lasts at least one hour. Patient using Fioricet and ibuprofen as needed for breakthrough headache. Patient has sister with severe migraine headaches. Recent changes include change in her job start time. Patient is to wake up at 6:30 in the morning. Now she has to wake up at 4:30 in the morning. However she has been unable to adjust her sleep time. Patient averaging 5 hours of sleep per night, since October 1. Previously she was sleeping 7 hours per night.   REVIEW OF SYSTEMS: Full 14 system review of systems performed and notable only for headache insomnia incontinence fatigue freq urination.     ALLERGIES: Allergies  Allergen Reactions  . Other     Seasonal allergies     HOME MEDICATIONS: Outpatient Prescriptions Prior to Visit  Medication Sig Dispense Refill  . acetaminophen (TYLENOL) 500 MG tablet Take 1,000 mg by mouth every 6 (six) hours as needed for mild pain (pain).     [name] docusate sodium (COLACE) 100 MG capsule Take 100 mg by mouth 2 (two) times daily.    [name] EPINEPHrine (EPIPEN 2-PAK) 0.3 mg/0.3 mL DEVI Inject 0.3 mLs (0.3 mg total) into the muscle once as needed (for severe allergic reaction). CAll 911 immediately if you have to use this medicine 1 Device 1  . esomeprazole (NEXIUM) 40 MG capsule Take 40 mg by mouth every morning.     . lidocaine (XYLOCAINE JELLY) 2 % jelly Place 1 application into the urethra as needed. Apply topically as needed for discomfort. (Patient not taking: Reported on 04/15/2014) 30 mL 0  .  loratadine (CLARITIN) 10 MG tablet Take 10 mg by mouth every morning.    . Olopatadine HCl (PATADAY) 0.2 % SOLN Place 1 drop into both eyes daily as needed (dry eyes).     . Soft Lens Products (REWETTING DROPS) SOLN Place 2 drops into both eyes daily as needed (dry eyes).     . triamcinolone cream (KENALOG) 0.1 % Apply 1 application topically daily as needed (for rash).     . ACETAMINOPHEN-BUTALBITAL 50-325 MG TABS Take 1 tablet by mouth daily as needed.    . polyethylene glycol (MIRALAX / GLYCOLAX) packet Take 17 g by mouth daily as needed (constipation).     . predniSONE (DELTASONE) 10 MG tablet Take 60mg  on day 1. Reduce by 10mg  each subsequent day. (60, 50, 40, 30, 20, 10, stop) 21 tablet 0  . rizatriptan (MAXALT-MLT) 10 MG disintegrating tablet Take 1 tablet (10 mg total) by mouth as needed for migraine. May repeat in 2 hours if needed 9 tablet 11  . topiramate (TOPAMAX) 100 MG tablet Take 1 tablet (100 mg total) by mouth 2 (two) times daily. 60 tablet 12   No facility-administered medications prior to visit.    PAST MEDICAL HISTORY: Past Medical History  Diagnosis Date  . GERD (gastroesophageal reflux disease)   . Anemia   . Constipation   . Hx of endometriosis   . Macromastia 07/2011  . Migraine     PAST SURGICAL HISTORY: Past Surgical History  Procedure Laterality Date  . Salpingectomy  12/12/2003    right; with TAH  . Cesarean section  12/29/2000; 1994  . Bladder neck reconstruction  01/14/2011    Procedure: BLADDER NECK REPAIR;  Surgeon: Reva Boresanya S Pratt, MD;  Location: [facility];  Service: Gynecology;  Laterality: N/A;  Laparoscopic Repair of Incidental Cystotomy  . Ankle arthroscopy  12/29/2007    right; with extensive debridement  . Abdominal hysterectomy  12/12/2003  . Tubal ligation  12/29/2000  . Dilation and curettage of uterus  07/08/2003    open laparoscopy  . Repair peroneal tendons ankle  01/03/2010    repair right subluxing peroneal tendons  . Tonsillectomy  as a child  . Adenoidectomy  05/2011  . Laparoscopic salpingoopherectomy  01/14/2011    left  . Right oophorectomy      with lysis of adhesions  . Breast reduction surgery  08/05/2011    Procedure: MAMMARY REDUCTION  (BREAST);  Surgeon: Louisa SecondGerald Truesdale, MD;  Location: [facility];  Service: Plastics;  Laterality: Bilateral;  bilateral    FAMILY HISTORY: Family History   Problem Relation Age of Onset  . Diabetes Mother   . Hypertension Mother   . Thyroid disease Sister     SOCIAL HISTORY:  History   Social History  . Marital Status: Divorced    Spouse Name: N/A    Number of Children: 2  . Years of Education: 12th   Occupational History  .  Other    [employer]   Social History Main Topics  . Smoking status: Never Smoker   . Smokeless tobacco: Never Used  . Alcohol Use: No  . Drug Use: No  . Sexual Activity: Not on file   Other Topics Concern  . Not on file   Social History Narrative   Patient lives at home with family.   Caffeine Use: 1 cup daily     PHYSICAL EXAM  Filed Vitals:   05/31/14 1303  BP: 126/77  Pulse: 94  Temp: 98.3 F (  36.8 C)  TempSrc: Oral  Height:  (1.626 m)  Weight: 233 lb 9.6 oz (105.96 kg)    Not recorded     No exam data present   Body mass index is 40.08 kg/(m^2).  GENERAL EXAM: Patient is calm, but mildly irritable from headache pain; well developed, nourished and groomed; neck is supple  CARDIOVASCULAR: Regular rate and rhythm, no murmurs, no carotid bruits  NEUROLOGIC: MENTAL STATUS: awake, alert, language fluent, comprehension intact, naming intact, fund of knowledge appropriate CRANIAL NERVE: no papilledema on fundoscopic exam, pupils equal and reactive to light, visual fields full to confrontation, extraocular muscles intact, no nystagmus, facial sensation and strength symmetric, hearing intact, palate elevates symmetrically, uvula midline, shoulder shrug symmetric, tongue midline. MOTOR: normal bulk and tone, full strength in the BUE, BLE SENSORY: normal and symmetric to light touch, pinprick, temperature, vibration  COORDINATION: finger-nose-finger, fine finger movements normal REFLEXES: deep tendon reflexes present and symmetric GAIT/STATION: narrow based gait; able to tandem; romberg is negative    DIAGNOSTIC DATA (LABS, IMAGING, TESTING) - I reviewed patient records, labs,  notes, testing and imaging myself where available.  Lab Results  Component Value Date   WBC 6.7 03/13/2014   HGB 11.8* 03/13/2014   HCT 36.9 03/13/2014   MCV 74.5* 03/13/2014   PLT 351 03/13/2014      Component Value Date/Time   NA 142 03/13/2014 1902   K 3.9 03/13/2014 1902   CL 106 03/13/2014 1902   CO2 23 03/13/2014 1902   GLUCOSE 90 03/13/2014 1902   BUN 8 03/13/2014 1902   CREATININE 0.74 03/13/2014 1902   CALCIUM 9.5 03/13/2014 1902   PROT 7.9 12/15/2013 0807   ALBUMIN 3.7 12/15/2013 0807   AST 19 12/15/2013 0807   ALT 15 12/15/2013 0807   ALKPHOS 90 12/15/2013 0807   BILITOT <0.2* 12/15/2013 0807   GFRNONAA >90 03/13/2014 1902   GFRAA >90 03/13/2014 1902   No results found for: CHOL, HDL, LDLCALC, LDLDIRECT, TRIG, CHOLHDL No results found for: ZOXW9U Lab Results  Component Value Date   VITAMINB12 818 12/18/2008   Lab Results  Component Value Date   TSH 0.856 04/24/2007   I reviewed images myself and agree with interpretation. -VRP  03/13/14 CT head - normal  03/13/14 CTA head/neck - normal  05/02/14 MRI brain - normal  05/02/14 MRI cervical spine - normal     ASSESSMENT AND PLAN  39 y.o. year old female here with worsening headaches since 02/03/2014, suspicious for migraine without aura, with longer standing headaches since at least 2011. Also with significant anxiety, depression, sleep disturbances.    PLAN: - stop TPX and rizatriptan - start amitriptyline - use melatonin for sleep - encouraged daily exercise regimen - repeat sleep study (daytime sleepiness, obesity, fatigue, headaches)   Return in about 6 weeks (around 07/12/2014).    Suanne Marker, MD 05/31/2014, 1:36 PM Certified in Neurology, Neurophysiology and Neuroimaging  [facility] [address] [place], [place] [address] [phone]

## 2014-05-31 NOTE — Patient Instructions (Signed)
Stop topiramate.  Stop rizatriptan.  Start amitriptyline 25mg  at bedtime.  I will check sleep study.

## 2014-06-15 ENCOUNTER — Telehealth: Payer: Self-pay | Admitting: Neurology

## 2014-06-15 DIAGNOSIS — G4719 Other hypersomnia: Secondary | ICD-10-CM

## 2014-06-15 DIAGNOSIS — R519 Headache, unspecified: Secondary | ICD-10-CM

## 2014-06-15 DIAGNOSIS — R51 Headache: Secondary | ICD-10-CM

## 2014-06-15 NOTE — Telephone Encounter (Signed)
Dr. Joycelyn SchmidVikram Penumalli ,refers patient for attended sleep study.  Height: 5'4"  Weight: 233 lb 9.6 oz   BMI: 40.08  Past Medical History:  GERD (gastroesophageal reflux disease)    . Anemia   . Constipation   . Hx of endometriosis   . Macromastia 07/2011  . Migraine            Sleep Symptoms: poor sleep (trouble falling asleep, staying asleep, not refreshed when she wakes up). Thinks she had a sleep study in the past. I looked it up from 2011 (patient's name was Sheila BoundsVickie [name]) and it was normal PSG.    Epworth Score: Unable to reach the patient   Medication:  Acetaminophen (Tab) TYLENOL 500 MG Take 1,000 mg by mouth every 6 (six) hours as needed for mild pain (pain).        Amitriptyline HCl (Tab) ELAVIL 25 MG Take 1 tablet (25 mg total) by mouth at bedtime.      Docusate Sodium (Cap) COLACE 100 MG Take 100 mg by mouth 2 (two) times daily.      EPINEPHrine (Solution Auto-injector) EPI-PEN 0.3 mg/0.3 mL Inject 0.3 mLs (0.3 mg total) into the muscle once as needed (for severe allergic reaction). CAll 911 immediately if you have to use this medicine      Esomeprazole Magnesium (Capsule Delayed Release) NEXIUM 40 MG Take 40 mg by mouth every morning.       Lidocaine HCl (Gel) XYLOCAINE 2 % Place 1 application into the urethra as needed. Apply topically as needed for discomfort.      Loratadine (Tab) CLARITIN 10 MG Take 10 mg by mouth every morning.      Olopatadine HCl (Solution) Olopatadine HCl 0.2 % Place 1 drop into both eyes daily as needed (dry eyes).       Polyethylene Glycol 3350 (Powder) GLYCOLAX/MIRALAX        Soft Lens Products (Solution) REWETTING DROPS  Place 2 drops into both eyes daily as needed (dry eyes).       Triamcinolone Acetonide (Cream) KENALOG 0.1 % Apply 1 application topically daily as needed (for rash).       Ins: Medicaid   Assessment & Plan: 39 y.o. year old female here with worsening headaches since  02/03/2014, suspicious for migraine without aura, with longer standing headaches since at least 2011. Also with significant anxiety, depression, sleep disturbances.    PLAN: - stop TPX and rizatriptan - start amitriptyline - use melatonin for sleep - encouraged daily exercise regimen - repeat sleep study (daytime sleepiness, obesity, fatigue, headaches)   Return in about 6 weeks (around 07/12/2014).    Please review patient information and submit instructions for scheduling and orders for sleep technologist. Thank you.

## 2014-06-15 NOTE — Telephone Encounter (Signed)
In House Sleep study request review: This patient has an underlying medical history of severe obesity, migraine headaches, reflux disease, and anemia and is referred by Dr. Marjory LiesPenumalli for an attended sleep study due to a report of excessive daytime somnolence, nonrestorative sleep, recurrent headaches. I will order a split-night sleep study and see the patient in sleep medicine consultation afterwards as appropriate. Please print this note and attach to sleep study chart/package.   Sleep Acquisition Technologist instructions: Please score at 4% and split if 2 hour estimated AHI >15/h, unless mandated otherwise by the insurance carrier.    Huston FoleySaima Rumi Taras, MD, PhD [facility])

## 2014-06-25 ENCOUNTER — Emergency Department (HOSPITAL_COMMUNITY)
Admission: EM | Admit: 2014-06-25 | Discharge: 2014-06-26 | Disposition: A | Discharge status: Home / Self Care | Payer: Medicaid Other | Attending: Emergency Medicine | Admitting: Emergency Medicine

## 2014-06-25 DIAGNOSIS — Z8742 Personal history of other diseases of the female genital tract: Secondary | ICD-10-CM | POA: Insufficient documentation

## 2014-06-25 DIAGNOSIS — K5900 Constipation, unspecified: Secondary | ICD-10-CM | POA: Insufficient documentation

## 2014-06-25 DIAGNOSIS — D649 Anemia, unspecified: Secondary | ICD-10-CM | POA: Diagnosis not present

## 2014-06-25 DIAGNOSIS — Z8679 Personal history of other diseases of the circulatory system: Secondary | ICD-10-CM | POA: Insufficient documentation

## 2014-06-25 DIAGNOSIS — K219 Gastro-esophageal reflux disease without esophagitis: Secondary | ICD-10-CM | POA: Insufficient documentation

## 2014-06-25 DIAGNOSIS — R3915 Urgency of urination: Secondary | ICD-10-CM | POA: Diagnosis present

## 2014-06-25 DIAGNOSIS — Z79899 Other long term (current) drug therapy: Secondary | ICD-10-CM | POA: Insufficient documentation

## 2014-06-25 DIAGNOSIS — M545 Low back pain, unspecified: Secondary | ICD-10-CM

## 2014-06-26 ENCOUNTER — Encounter (HOSPITAL_COMMUNITY): Payer: Self-pay

## 2014-06-26 LAB — CBC WITH DIFFERENTIAL/PLATELET
Basophils Absolute: 0 10*3/uL (ref 0.0–0.1)
Basophils Relative: 1 % (ref 0–1)
Eosinophils Absolute: 0.1 10*3/uL (ref 0.0–0.7)
Eosinophils Relative: 1 % (ref 0–5)
HCT: 32.7 % — ABNORMAL LOW (ref 36.0–46.0)
Hemoglobin: 10.3 g/dL — ABNORMAL LOW (ref 12.0–15.0)
Lymphocytes Relative: 47 % — ABNORMAL HIGH (ref 12–46)
Lymphs Abs: 3 10*3/uL (ref 0.7–4.0)
MCH: 23.6 pg — ABNORMAL LOW (ref 26.0–34.0)
MCHC: 31.5 g/dL (ref 30.0–36.0)
MCV: 74.8 fL — ABNORMAL LOW (ref 78.0–100.0)
Monocytes Absolute: 0.6 10*3/uL (ref 0.1–1.0)
Monocytes Relative: 9 % (ref 3–12)
Neutro Abs: 2.6 10*3/uL (ref 1.7–7.7)
Neutrophils Relative %: 42 % — ABNORMAL LOW (ref 43–77)
Platelets: 342 10*3/uL (ref 150–400)
RBC: 4.37 MIL/uL (ref 3.87–5.11)
RDW: 15.1 % (ref 11.5–15.5)
WBC: 6.3 10*3/uL (ref 4.0–10.5)

## 2014-06-26 LAB — URINALYSIS, ROUTINE W REFLEX MICROSCOPIC
Bilirubin Urine: NEGATIVE
Glucose, UA: NEGATIVE mg/dL
Hgb urine dipstick: NEGATIVE
Ketones, ur: NEGATIVE mg/dL
Leukocytes, UA: NEGATIVE
Nitrite: NEGATIVE
Protein, ur: NEGATIVE mg/dL
Specific Gravity, Urine: 1.012 (ref 1.005–1.030)
Urobilinogen, UA: 0.2 mg/dL (ref 0.0–1.0)
pH: 6 (ref 5.0–8.0)

## 2014-06-26 LAB — BASIC METABOLIC PANEL
Anion gap: 7 (ref 5–15)
BUN: 10 mg/dL (ref 6–23)
CO2: 30 mmol/L (ref 19–32)
Calcium: 9.2 mg/dL (ref 8.4–10.5)
Chloride: 101 mmol/L (ref 96–112)
Creatinine, Ser: 0.6 mg/dL (ref 0.50–1.10)
GFR calc Af Amer: >90 mL/min (ref >90)
GFR calc non Af Amer: >90 mL/min (ref >90)
Glucose, Bld: 84 mg/dL (ref 70–99)
Potassium: 3.7 mmol/L (ref 3.5–5.1)
Sodium: 138 mmol/L (ref 135–145)

## 2014-06-26 MED ORDER — CYCLOBENZAPRINE HCL 10 MG PO TABS: 10.0000 mg | ORAL_TABLET | Freq: Two times a day (BID) | ORAL | Status: DC | PRN

## 2014-06-26 MED ORDER — MELOXICAM 15 MG PO TABS: 15.0000 mg | ORAL_TABLET | Freq: Every day | ORAL | Status: DC

## 2014-06-26 NOTE — ED Provider Notes (Signed)
CSN: [id number]     Arrival date & time 06/25/14  2345 History   First MD Initiated Contact with Patient 06/26/14 0009     Chief Complaint  Patient presents with  . Urinary Urgency  . Flank Pain     (Consider location/radiation/quality/duration/timing/severity/associated sxs/prior Treatment) HPI Comments: Patient is a 39 year old female who presents with right lower back pain for the past 2 days. The pain does not radiate. The pain is described as aching and severe. The pain started gradually and progressively worsened since the onset. No alleviating/aggravating factors. The patient has tried OTC medications for symptoms without relief. Associated symptoms include increased urinary frequency. Patient denies fever, headache, NVD, chest pain, SOB, dysuria, constipation, abnormal vaginal bleeding/discharge.      Past Medical History  Diagnosis Date  . GERD (gastroesophageal reflux disease)   . Anemia   . Constipation   . Hx of endometriosis   . Macromastia 07/2011  . Migraine    Past Surgical History  Procedure Laterality Date  . Salpingectomy  12/12/2003    right; with TAH  . Cesarean section  12/29/2000; 1994  . Bladder neck reconstruction  01/14/2011    Procedure: BLADDER NECK REPAIR;  Surgeon: Reva Bores, MD;  Location: [facility];  Service: Gynecology;  Laterality: N/A;  Laparoscopic Repair of Incidental Cystotomy  . Ankle arthroscopy  12/29/2007    right; with extensive debridement  . Abdominal hysterectomy  12/12/2003  . Tubal ligation  12/29/2000  . Dilation and curettage of uterus  07/08/2003    open laparoscopy  . Repair peroneal tendons ankle  01/03/2010    repair right subluxing peroneal tendons  . Tonsillectomy  as a child  . Adenoidectomy  05/2011  . Laparoscopic salpingoopherectomy  01/14/2011    left  . Right oophorectomy      with lysis of adhesions  . Breast reduction surgery  08/05/2011    Procedure: MAMMARY REDUCTION  (BREAST);  Surgeon: Louisa Second, MD;  Location:  [facility];  Service: Plastics;  Laterality: Bilateral;  bilateral   Family History  Problem Relation Age of Onset  . Diabetes Mother   . Hypertension Mother   . Thyroid disease Sister    History  Substance Use Topics  . Smoking status: Never Smoker   . Smokeless tobacco: Never Used  . Alcohol Use: No   OB History    Gravida Para Term Preterm AB TAB SAB Ectopic Multiple Living   Review of Systems  Constitutional: Negative for fever, chills and fatigue.  HENT: Negative for trouble swallowing.   Eyes: Negative for visual disturbance.  Respiratory: Negative for shortness of breath.   Cardiovascular: Negative for chest pain and palpitations.  Gastrointestinal: Negative for nausea, vomiting, abdominal pain and diarrhea.  Genitourinary: Positive for frequency. Negative for dysuria and difficulty urinating.  Musculoskeletal: Positive for back pain. Negative for arthralgias and neck pain.  Skin: Negative for color change.  Neurological: Negative for dizziness and weakness.  Psychiatric/Behavioral: Negative for dysphoric mood.      Allergies  Other  Home Medications   Prior to Admission medications   Medication Sig Start Date End Date Taking? Authorizing Provider  acetaminophen (TYLENOL) 500 MG tablet Take 1,000 mg by mouth every 6 (six) hours as needed for mild pain (pain).     Historical Provider, MD  amitriptyline (ELAVIL) 25 MG tablet Take 1 tablet (25 mg total) by mouth  at bedtime. 05/31/14   Suanne MarkerVikram R Penumalli, MD  docusate sodium (COLACE) 100 MG capsule Take 100 mg by mouth 2 (two) times daily.    Historical Provider, MD  EPINEPHrine (EPIPEN 2-PAK) 0.3 mg/0.3 mL DEVI Inject 0.3 mLs (0.3 mg total) into the muscle once as needed (for severe allergic reaction). CAll 911 immediately if you have to use this medicine 09/09/12   Victorino DikeJennifer L Piepenbrink, PA-C  esomeprazole (NEXIUM) 40 MG capsule Take 40 mg by mouth every morning.     Historical  Provider, MD  lidocaine (XYLOCAINE JELLY) 2 % jelly Place 1 application into the urethra as needed. Apply topically as needed for discomfort. Patient not taking: Reported on 04/15/2014 12/02/13   Garlon HatchetLisa M Sanders, PA-C  loratadine (CLARITIN) 10 MG tablet Take 10 mg by mouth every morning.    Historical Provider, MD  Olopatadine HCl (PATADAY) 0.2 % SOLN Place 1 drop into both eyes daily as needed (dry eyes).     Historical Provider, MD  polyethylene glycol powder (GLYCOLAX/MIRALAX) powder  05/24/14   Historical Provider, MD  Soft Lens Products (REWETTING DROPS) SOLN Place 2 drops into both eyes daily as needed (dry eyes).     Historical Provider, MD  triamcinolone cream (KENALOG) 0.1 % Apply 1 application topically daily as needed (for rash).    Historical Provider, MD   BP 110/68 mmHg  Pulse 75  Temp(Src) 98.1 F (36.7 C) (Oral)  Resp 16  SpO2 100%  LMP 12/27/2003 Physical Exam  Constitutional: She is oriented to person, place, and time. She appears well-developed and well-nourished. No distress.  HENT:  Head: Normocephalic and atraumatic.  Eyes: Conjunctivae and EOM are normal.  Neck: Normal range of motion.  Cardiovascular: Normal rate and regular rhythm.  Exam reveals no gallop and no friction rub.   No murmur heard. Pulmonary/Chest: Effort normal and breath sounds normal. She has no wheezes. She has no rales. She exhibits no tenderness.  Abdominal: Soft. She exhibits no distension. There is no tenderness. There is no rebound.  Musculoskeletal: Normal range of motion.  No midline spine tenderness to palpation. Right lumbar paraspinal tenderness to palpation.   Neurological: She is alert and oriented to person, place, and time. Coordination normal.  Speech is goal-oriented. Moves limbs without ataxia.   Skin: Skin is warm and dry.  Psychiatric: She has a normal mood and affect. Her behavior is normal.  Nursing note and vitals reviewed.   ED Course  Procedures (including critical  care time) Labs Review Labs Reviewed  CBC WITH DIFFERENTIAL/PLATELET - Abnormal; Notable for the following:    Hemoglobin 10.3 (*)    HCT 32.7 (*)    MCV 74.8 (*)    MCH 23.6 (*)    Neutrophils Relative % 42 (*)    Lymphocytes Relative 47 (*)    All other components within normal limits  URINALYSIS, ROUTINE W REFLEX MICROSCOPIC  BASIC METABOLIC PANEL    Imaging Review No results found.   EKG Interpretation None      MDM   Final diagnoses:  Right-sided low back pain without sciatica    1:06 AM Patient likely has muscle spasm of lumbar paraspinal muscles. No bladder/bowel incontinence or saddle paresthesias. Vitals stable and patient afebrile. Urinalysis shows no infection. Patient will have mobic and flexeril for pain.     [address]Zakariah Dejarnette BlanketSzekalski, PA-C 06/26/14 0304  Loren Raceravid Yelverton, MD 06/26/14 [phone]

## 2014-06-26 NOTE — ED Notes (Signed)
Patient reports that she has had urinary urgency and frequency for several days and bilateral flank pain that started two days ago.

## 2014-06-26 NOTE — Discharge Instructions (Signed)
Take mobic as needed for pain. Take flexeril as needed for muscle spasm. Refer to attached documents for more information.  °

## 2014-07-14 ENCOUNTER — Ambulatory Visit: Payer: Medicaid Other | Admitting: Diagnostic Neuroimaging

## 2014-07-21 ENCOUNTER — Ambulatory Visit: Payer: Medicaid Other | Admitting: Diagnostic Neuroimaging

## 2014-08-05 ENCOUNTER — Ambulatory Visit (INDEPENDENT_AMBULATORY_CARE_PROVIDER_SITE_OTHER): Payer: Medicaid Other | Admitting: Neurology

## 2014-08-05 VITALS — BP 122/80 | HR 97

## 2014-08-05 DIAGNOSIS — R0683 Snoring: Secondary | ICD-10-CM

## 2014-08-05 DIAGNOSIS — G478 Other sleep disorders: Secondary | ICD-10-CM

## 2014-08-05 DIAGNOSIS — G4720 Circadian rhythm sleep disorder, unspecified type: Secondary | ICD-10-CM

## 2014-08-05 NOTE — Sleep Study (Signed)
Please see the scanned sleep study interpretation located in the Procedure tab within the Chart Review section. 

## 2014-08-15 ENCOUNTER — Ambulatory Visit: Payer: Medicaid Other | Admitting: Diagnostic Neuroimaging

## 2014-08-16 ENCOUNTER — Encounter: Payer: Self-pay | Admitting: Diagnostic Neuroimaging

## 2014-08-24 ENCOUNTER — Telehealth: Payer: Self-pay | Admitting: Neurology

## 2014-08-24 NOTE — Telephone Encounter (Signed)
I spoke with patient and she is aware of sleep study results. She is aware to follow up with Dr. Marjory LiesPenumalli with any further problems.

## 2014-08-24 NOTE — Telephone Encounter (Signed)
Dr. Marjory LiesPenumalli patient:   [name]: Please apologize on my behalf for the delay in reading. Was in Dr. Ashok Cordia's reading pile. Please call and notify the patient that the recent sleep study did not show any significant obstructive sleep apnea and she had good oxygen saturations in sleep. She had mild intermittent snoring and losing weight and trying to sleep off her back may help. She can FU with Dr. Marjory LiesPenumalli as planned. Once you have spoken to patient, you can close this encounter.    David: pls send report also to PCP, thx   Thanks,  Huston FoleySaima Jadalee Westcott, MD, PhD [facility] ([facility])

## 2014-08-25 ENCOUNTER — Encounter: Payer: Self-pay | Admitting: Neurology

## 2014-10-07 ENCOUNTER — Encounter (HOSPITAL_COMMUNITY): Payer: Self-pay | Admitting: Emergency Medicine

## 2014-10-07 ENCOUNTER — Emergency Department (HOSPITAL_COMMUNITY)
Admission: EM | Admit: 2014-10-07 | Discharge: 2014-10-08 | Discharge status: Against Medical Advice | Payer: Medicaid Other | Attending: Emergency Medicine | Admitting: Emergency Medicine

## 2014-10-07 DIAGNOSIS — Y999 Unspecified external cause status: Secondary | ICD-10-CM | POA: Insufficient documentation

## 2014-10-07 DIAGNOSIS — T24002A Burn of unspecified degree of unspecified site of left lower limb, except ankle and foot, initial encounter: Secondary | ICD-10-CM | POA: Diagnosis present

## 2014-10-07 DIAGNOSIS — Y939 Activity, unspecified: Secondary | ICD-10-CM | POA: Diagnosis not present

## 2014-10-07 DIAGNOSIS — Y929 Unspecified place or not applicable: Secondary | ICD-10-CM | POA: Diagnosis not present

## 2014-10-07 DIAGNOSIS — W868XXA Exposure to other electric current, initial encounter: Secondary | ICD-10-CM | POA: Diagnosis not present
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
## 2014-10-08 NOTE — ED Notes (Signed)
Pt called from lobby, no answer. 

## 2014-10-09 ENCOUNTER — Encounter (HOSPITAL_COMMUNITY): Payer: Self-pay | Admitting: *Deleted

## 2014-10-09 ENCOUNTER — Emergency Department (HOSPITAL_COMMUNITY)
Admission: EM | Admit: 2014-10-09 | Discharge: 2014-10-09 | Discharge status: Against Medical Advice | Payer: Medicaid Other | Attending: Emergency Medicine | Admitting: Emergency Medicine

## 2014-10-09 DIAGNOSIS — Y9389 Activity, other specified: Secondary | ICD-10-CM | POA: Insufficient documentation

## 2014-10-09 DIAGNOSIS — Y998 Other external cause status: Secondary | ICD-10-CM | POA: Insufficient documentation

## 2014-10-09 DIAGNOSIS — T24202A Burn of second degree of unspecified site of left lower limb, except ankle and foot, initial encounter: Secondary | ICD-10-CM | POA: Diagnosis not present

## 2014-10-09 DIAGNOSIS — Z79899 Other long term (current) drug therapy: Secondary | ICD-10-CM | POA: Insufficient documentation

## 2014-10-09 DIAGNOSIS — D649 Anemia, unspecified: Secondary | ICD-10-CM | POA: Insufficient documentation

## 2014-10-09 DIAGNOSIS — K219 Gastro-esophageal reflux disease without esophagitis: Secondary | ICD-10-CM | POA: Diagnosis not present

## 2014-10-09 DIAGNOSIS — K5900 Constipation, unspecified: Secondary | ICD-10-CM | POA: Insufficient documentation

## 2014-10-09 DIAGNOSIS — G43909 Migraine, unspecified, not intractable, without status migrainosus: Secondary | ICD-10-CM | POA: Insufficient documentation

## 2014-10-09 DIAGNOSIS — Y92193 Bedroom in other specified residential institution as the place of occurrence of the external cause: Secondary | ICD-10-CM | POA: Diagnosis not present

## 2014-10-09 DIAGNOSIS — X16XXXA Contact with hot heating appliances, radiators and pipes, initial encounter: Secondary | ICD-10-CM | POA: Insufficient documentation

## 2014-10-09 DIAGNOSIS — T24002A Burn of unspecified degree of unspecified site of left lower limb, except ankle and foot, initial encounter: Secondary | ICD-10-CM | POA: Diagnosis present

## 2014-10-09 DIAGNOSIS — Z8742 Personal history of other diseases of the female genital tract: Secondary | ICD-10-CM | POA: Diagnosis not present

## 2014-10-09 DIAGNOSIS — Z791 Long term (current) use of non-steroidal anti-inflammatories (NSAID): Secondary | ICD-10-CM | POA: Insufficient documentation
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
## 2014-10-09 NOTE — Discharge Instructions (Signed)
Burn Care Your skin is a natural barrier to infection. It is the largest organ of your body. Burns damage this natural protection. To help prevent infection, it is very important to follow your caregiver's instructions in the care of your burn. Burns are classified as:  First degree. There is only redness of the skin (erythema). No scarring is expected.  Second degree. There is blistering of the skin. Scarring may occur with deeper burns.  Third degree. All layers of the skin are injured, and scarring is expected. HOME CARE INSTRUCTIONS   Wash your hands well before changing your bandage.  Change your bandage as often as directed by your caregiver.  Remove the old bandage. If the bandage sticks, you may soak it off with cool, clean water.  Cleanse the burn thoroughly but gently with mild soap and water.  Pat the area dry with a clean, dry cloth.  Apply a thin layer of antibacterial cream to the burn.  Apply a clean bandage as instructed by your caregiver.  Keep the bandage as clean and dry as possible.  Elevate the affected area for the first 24 hours, then as instructed by your caregiver.  Only take over-the-counter or prescription medicines for pain, discomfort, or fever as directed by your caregiver. SEEK IMMEDIATE MEDICAL CARE IF:   You develop excessive pain.  You develop redness, tenderness, swelling, or red streaks near the burn.  The burned area develops yellowish-white fluid (pus) or a bad smell.  You have a fever. MAKE SURE YOU:   Understand these instructions.  Will watch your condition.  Will get help right away if you are not doing well or get worse. Document Released: 04/22/2005 Document Revised: 07/15/2011 Document Reviewed: 09/12/2010 Exit[employer] Patient Information 2015 [employer]. This information is not intended to replace advice given to you by your health care provider. Make sure you discuss any questions you have with your health care  provider.  

## 2014-12-31 ENCOUNTER — Emergency Department (HOSPITAL_COMMUNITY)
Admission: EM | Admit: 2014-12-31 | Discharge: 2015-01-01 | Disposition: A | Discharge status: Home / Self Care | Payer: Medicaid Other | Attending: Emergency Medicine | Admitting: Emergency Medicine

## 2014-12-31 DIAGNOSIS — K219 Gastro-esophageal reflux disease without esophagitis: Secondary | ICD-10-CM | POA: Insufficient documentation

## 2014-12-31 DIAGNOSIS — K5900 Constipation, unspecified: Secondary | ICD-10-CM | POA: Diagnosis not present

## 2014-12-31 DIAGNOSIS — R519 Headache, unspecified: Secondary | ICD-10-CM

## 2014-12-31 DIAGNOSIS — R51 Headache: Secondary | ICD-10-CM | POA: Diagnosis present

## 2014-12-31 DIAGNOSIS — D649 Anemia, unspecified: Secondary | ICD-10-CM | POA: Insufficient documentation

## 2014-12-31 DIAGNOSIS — Z79899 Other long term (current) drug therapy: Secondary | ICD-10-CM | POA: Diagnosis not present

## 2014-12-31 DIAGNOSIS — Z8742 Personal history of other diseases of the female genital tract: Secondary | ICD-10-CM | POA: Insufficient documentation

## 2014-12-31 DIAGNOSIS — H9209 Otalgia, unspecified ear: Secondary | ICD-10-CM | POA: Diagnosis not present

## 2014-12-31 DIAGNOSIS — G43909 Migraine, unspecified, not intractable, without status migrainosus: Secondary | ICD-10-CM | POA: Insufficient documentation

## 2015-01-01 ENCOUNTER — Encounter (HOSPITAL_COMMUNITY): Payer: Self-pay | Admitting: Emergency Medicine

## 2015-01-01 MED ORDER — DIPHENHYDRAMINE HCL 50 MG/ML IJ SOLN
12.5000 mg | Freq: Once | INTRAMUSCULAR | Status: AC
Start: 2015-01-01 — End: 2015-01-01
  Administered 2015-01-01: 12.5 mg via INTRAVENOUS
  Filled 2015-01-01: qty 1

## 2015-01-01 MED ORDER — KETOROLAC TROMETHAMINE 30 MG/ML IJ SOLN
30.0000 mg | Freq: Once | INTRAMUSCULAR | Status: AC
  Administered 2015-01-01: 30 mg via INTRAVENOUS
  Filled 2015-01-01: qty 1

## 2015-01-01 MED ORDER — SODIUM CHLORIDE 0.9 % IV SOLN
Freq: Once | INTRAVENOUS | Status: AC
  Administered 2015-01-01: 02:00:00 via INTRAVENOUS

## 2015-01-01 MED ORDER — METOCLOPRAMIDE HCL 5 MG/ML IJ SOLN
10.0000 mg | Freq: Once | INTRAMUSCULAR | Status: AC
  Administered 2015-01-01: 10 mg via INTRAVENOUS
  Filled 2015-01-01: qty 2

## 2015-01-01 MED ORDER — ACETAMINOPHEN 325 MG PO TABS
650.0000 mg | ORAL_TABLET | Freq: Once | ORAL | Status: AC
  Administered 2015-01-01: 650 mg via ORAL
  Filled 2015-01-01: qty 2

## 2015-01-01 NOTE — ED Provider Notes (Signed)
CSN: [id number]     Arrival date & time 12/31/14  2333 History   First MD Initiated Contact with Patient 01/01/15 0035     No chief complaint on file.    (Consider location/radiation/quality/duration/timing/severity/associated sxs/prior Treatment) HPI Comments: This 39 year old female with a history of migraine headaches.  She states for the past 2-3 days.  She's had a migraine headache started in the right temporal and it is extended across her forehead.  She states she's had nausea, some vomiting today.  She's having a hard time concentrating due to the pain.  She's only taken Tylenol without relief.  She states she was seen by neurology in March and given prescription for medications, but it made her sick, so she stopped taking it  The history is provided by the patient.    Past Medical History  Diagnosis Date  . GERD (gastroesophageal reflux disease)   . Anemia   . Constipation   . Hx of endometriosis   . Macromastia 07/2011  . Migraine    Past Surgical History  Procedure Laterality Date  . Salpingectomy  12/12/2003    right; with TAH  . Cesarean section  12/29/2000; 1994  . Bladder neck reconstruction  01/14/2011    Procedure: BLADDER NECK REPAIR;  Surgeon: Reva Bores, MD;  Location: [facility];  Service: Gynecology;  Laterality: N/A;  Laparoscopic Repair of Incidental Cystotomy  . Ankle arthroscopy  12/29/2007    right; with extensive debridement  . Abdominal hysterectomy  12/12/2003  . Tubal ligation  12/29/2000  . Dilation and curettage of uterus  07/08/2003    open laparoscopy  . Repair peroneal tendons ankle  01/03/2010    repair right subluxing peroneal tendons  . Tonsillectomy  as a child  . Adenoidectomy  05/2011  . Laparoscopic salpingoopherectomy  01/14/2011    left  . Right oophorectomy      with lysis of adhesions  . Breast reduction surgery  08/05/2011    Procedure: MAMMARY REDUCTION  (BREAST);  Surgeon: Louisa Second, MD;  [facility];   Service: Plastics;  Laterality: Bilateral;  bilateral   Family History  Problem Relation Age of Onset  . Diabetes Mother   . Hypertension Mother   . Thyroid disease Sister    Social History  Substance Use Topics  . Smoking status: Never Smoker   . Smokeless tobacco: Never Used  . Alcohol Use: No   OB History    Gravida Para Term Preterm AB TAB SAB Ectopic Multiple Living   Review of Systems  Constitutional: Negative for fever.  HENT: Positive for ear pain.   Eyes: Positive for photophobia. Negative for pain.  Gastrointestinal: Positive for nausea and vomiting.  Skin: Negative for rash.  Neurological: Positive for headaches. Negative for dizziness.  All other systems reviewed and are negative.     Allergies  Other  Home Medications   Prior to Admission medications   Medication Sig Start Date End Date Taking? Authorizing Provider  acetaminophen (TYLENOL) 500 MG tablet Take 1,000 mg by mouth every 6 (six) hours as needed for mild pain (pain).    Yes Historical Provider, MD  docusate sodium (COLACE) 100 MG capsule Take 100 mg by mouth 2 (two) times daily as needed for moderate constipation.    Yes Historical Provider, MD  EPINEPHrine (EPIPEN 2-PAK) 0.3 mg/0.3 mL DEVI Inject 0.3 mLs (0.3 mg total) into the muscle once  as needed (for severe allergic reaction). CAll 911 immediately if you have to use this medicine 09/09/12  Yes Victorino Dike Piepenbrink, PA-C  esomeprazole (NEXIUM) 40 MG capsule Take 40 mg by mouth every morning.    Yes Historical Provider, MD  ferrous sulfate 325 (65 FE) MG tablet Take 325 mg by mouth daily with breakfast.   Yes Historical Provider, MD  ibuprofen (ADVIL,MOTRIN) 200 MG tablet Take 200 mg by mouth every 6 (six) hours as needed for moderate pain.   Yes Historical Provider, MD  loratadine (CLARITIN) 10 MG tablet Take 10 mg by mouth every other day.    Yes Historical Provider, MD  polyethylene glycol (MIRALAX / GLYCOLAX) packet Take 17 g  by mouth daily as needed for moderate constipation.   Yes Historical Provider, MD  valACYclovir (VALTREX) 500 MG tablet Take 500 mg by mouth 2 (two) times daily as needed (outbreaks).   Yes Historical Provider, MD  amitriptyline (ELAVIL) 25 MG tablet Take 1 tablet (25 mg total) by mouth at bedtime. Patient not taking: Reported on 01/01/2015 05/31/14   Suanne Marker, MD  cyclobenzaprine (FLEXERIL) 10 MG tablet Take 1 tablet (10 mg total) by mouth 2 (two) times daily as needed for muscle spasms. Patient not taking: Reported on 01/01/2015 06/26/14   Emilia Beck, PA-C  lidocaine (XYLOCAINE JELLY) 2 % jelly Place 1 application into the urethra as needed. Apply topically as needed for discomfort. Patient not taking: Reported on 04/15/2014 12/02/13   Garlon Hatchet, PA-C  meloxicam (MOBIC) 15 MG tablet Take 1 tablet (15 mg total) by mouth daily. Patient not taking: Reported on 01/01/2015 06/26/14   Emilia Beck, PA-C   BP 125/65 mmHg  Pulse 88  Temp(Src) 98.4 F (36.9 C) (Oral)  Resp 20  SpO2 100%  LMP 12/27/2003 Physical Exam  Constitutional: She is oriented to person, place, and time. She appears well-developed and well-nourished.  HENT:  Head: Normocephalic.  Right Ear: External ear normal.  Left Ear: External ear normal.  No temporal tenderness  Eyes: Pupils are equal, round, and reactive to light.  Neck: Normal range of motion.  Cardiovascular: Normal rate and regular rhythm.   Pulmonary/Chest: Effort normal and breath sounds normal.  Musculoskeletal: Normal range of motion.  Lymphadenopathy:    She has no cervical adenopathy.  Neurological: She is alert and oriented to person, place, and time.  Skin: Skin is warm and dry. No erythema.  Nursing note and vitals reviewed.   ED Course  Procedures (including critical care time) Labs Review Labs Reviewed - No data to display  Imaging Review No results found. I have personally reviewed and evaluated these images and lab  results as part of my medical decision-making.   EKG Interpretation None     Patient received IV Toradol, Benadryl and Reglan with significant decrease and her headache down to a 3-4 out of 10.  She was given additional by mouth Tylenol prior to her discharge.  It is recommended that she see her primary care physician and if she has continued persistent headaches again to see the neurologist to come up with a better treatment plan and medication regime that will not cause her to be nauseated MDM   Final diagnoses:  Nonintractable episodic headache, unspecified headache type         Earley Favor, NP 01/01/15 0301  Marisa Severin, MD 01/01/15 [phone]

## 2015-01-01 NOTE — Discharge Instructions (Signed)
If you headaches persist, I would recommend again seen the neurologist to see if they can come up with a treatment plan for you, and medication regime that will not cause you to be nauseated

## 2015-01-01 NOTE — ED Notes (Signed)
Pt c/o frontal HA x2-3 days with intermittent vomiting. Denies sensitivity to light or sound. No relief with tylenol or ibuprofen. Last episode of emesis reported 1730.

## 2015-07-24 ENCOUNTER — Emergency Department (INDEPENDENT_AMBULATORY_CARE_PROVIDER_SITE_OTHER)
Admission: EM | Admit: 2015-07-24 | Discharge: 2015-07-24 | Disposition: A | Discharge status: Home / Self Care | Payer: No Typology Code available for payment source | Source: Home / Self Care | Attending: Family Medicine | Admitting: Family Medicine

## 2015-07-24 ENCOUNTER — Encounter (HOSPITAL_COMMUNITY): Payer: Self-pay | Admitting: Emergency Medicine

## 2015-07-24 DIAGNOSIS — R69 Illness, unspecified: Principal | ICD-10-CM

## 2015-07-24 DIAGNOSIS — J111 Influenza due to unidentified influenza virus with other respiratory manifestations: Secondary | ICD-10-CM

## 2015-07-24 MED ORDER — IPRATROPIUM BROMIDE 0.06 % NA SOLN: 2.0000 | Freq: Four times a day (QID) | NASAL | Status: DC

## 2015-07-24 NOTE — Discharge Instructions (Signed)
Drink plenty of fluids as discussed, use medicine as prescribed, and mucinex or delsym for cough. Return or see your doctor if further problems °

## 2015-07-24 NOTE — ED Notes (Addendum)
Onset of symptoms one week ago.  Saw pcp last Thursday and advised to give symptoms a few more days.  Patient complains of headache, chills, sore throat, left ear pain, body aches.  Poor appetite

## 2015-07-24 NOTE — ED Provider Notes (Signed)
CSN: [id number]     Arrival date & time 07/24/15  1335 History   First MD Initiated Contact with Patient 07/24/15 1532     Chief Complaint  Patient presents with  . URI   (Consider location/radiation/quality/duration/timing/severity/associated sxs/prior Treatment) Patient is a 40 y.o. female presenting with URI. The history is provided by the patient.  URI Presenting symptoms: congestion, cough and rhinorrhea   Presenting symptoms: no fever   Severity:  Mild Onset quality:  Gradual Duration:  1 week Progression:  Unchanged Chronicity:  New Relieved by:  None tried Worsened by:  Nothing tried Ineffective treatments:  None tried Associated symptoms: myalgias   Risk factors: recent illness     Past Medical History  Diagnosis Date  . GERD (gastroesophageal reflux disease)   . Anemia   . Constipation   . Hx of endometriosis   . Macromastia 07/2011  . Migraine    Past Surgical History  Procedure Laterality Date  . Salpingectomy  12/12/2003    right; with TAH  . Cesarean section  12/29/2000; 1994  . Bladder neck reconstruction  01/14/2011    Procedure: BLADDER NECK REPAIR;  Surgeon: Reva Bores, MD;  Location: [facility];  Service: Gynecology;  Laterality: N/A;  Laparoscopic Repair of Incidental Cystotomy  . Ankle arthroscopy  12/29/2007    right; with extensive debridement  . Abdominal hysterectomy  12/12/2003  . Tubal ligation  12/29/2000  . Dilation and curettage of uterus  07/08/2003    open laparoscopy  . Repair peroneal tendons ankle  01/03/2010    repair right subluxing peroneal tendons  . Tonsillectomy  as a child  . Adenoidectomy  05/2011  . Laparoscopic salpingoopherectomy  01/14/2011    left  . Right oophorectomy      with lysis of adhesions  . Breast reduction surgery  08/05/2011    Procedure: MAMMARY REDUCTION  (BREAST);  Surgeon: Louisa Second, MD;  Location: [facility];  Service: Plastics;  Laterality: Bilateral;  bilateral   Family History  Problem  Relation Age of Onset  . Diabetes Mother   . Hypertension Mother   . Thyroid disease Sister    Social History  Substance Use Topics  . Smoking status: Never Smoker   . Smokeless tobacco: Never Used  . Alcohol Use: No   OB History    Gravida Para Term Preterm AB TAB SAB Ectopic Multiple Living   Review of Systems  Constitutional: Positive for chills, activity change and appetite change. Negative for fever.  HENT: Positive for congestion, postnasal drip and rhinorrhea.   Respiratory: Positive for cough.   Gastrointestinal: Negative.   Genitourinary: Negative.   Musculoskeletal: Positive for myalgias.  All other systems reviewed and are negative.   Allergies  Other  Home Medications   Prior to Admission medications   Medication Sig Start Date End Date Taking? Authorizing Provider  Chlorphen-Pseudoephed-APAP [facility] FLU/COLD PO) Take by mouth.   Yes Historical Provider, MD  Pseudoeph-Doxylamine-DM-APAP (NYQUIL PO) Take by mouth.   Yes Historical Provider, MD  acetaminophen (TYLENOL) 500 MG tablet Take 1,000 mg by mouth every 6 (six) hours as needed for mild pain (pain).     Historical Provider, MD  amitriptyline (ELAVIL) 25 MG tablet Take 1 tablet (25 mg total) by mouth at bedtime. Patient not taking: Reported on 01/01/2015 05/31/14   Suanne Marker, MD  cyclobenzaprine (FLEXERIL) 10 MG tablet Take 1 tablet (10  mg total) by mouth 2 (two) times daily as needed for muscle spasms. Patient not taking: Reported on 01/01/2015 06/26/14   Emilia BeckKaitlyn Szekalski, PA-C  docusate sodium (COLACE) 100 MG capsule Take 100 mg by mouth 2 (two) times daily as needed for moderate constipation.     Historical Provider, MD  EPINEPHrine (EPIPEN 2-PAK) 0.3 mg/0.3 mL DEVI Inject 0.3 mLs (0.3 mg total) into the muscle once as needed (for severe allergic reaction). CAll 911 immediately if you have to use this medicine 09/09/12   Francee PiccoloJennifer Piepenbrink, PA-C  esomeprazole (NEXIUM) 40 MG  capsule Take 40 mg by mouth every morning.     Historical Provider, MD  ferrous sulfate 325 (65 FE) MG tablet Take 325 mg by mouth daily with breakfast.    Historical Provider, MD  ibuprofen (ADVIL,MOTRIN) 200 MG tablet Take 200 mg by mouth every 6 (six) hours as needed for moderate pain.    Historical Provider, MD  ipratropium (ATROVENT) 0.06 % nasal spray Place 2 sprays into both nostrils 4 (four) times daily. 07/24/15   Linna HoffJames D Canuto Kingston, MD  lidocaine (XYLOCAINE JELLY) 2 % jelly Place 1 application into the urethra as needed. Apply topically as needed for discomfort. Patient not taking: Reported on 04/15/2014 12/02/13   Garlon HatchetLisa M Sanders, PA-C  loratadine (CLARITIN) 10 MG tablet Take 10 mg by mouth every other day.     Historical Provider, MD  meloxicam (MOBIC) 15 MG tablet Take 1 tablet (15 mg total) by mouth daily. Patient not taking: Reported on 01/01/2015 06/26/14   Emilia BeckKaitlyn Szekalski, PA-C  polyethylene glycol (MIRALAX / GLYCOLAX) packet Take 17 g by mouth daily as needed for moderate constipation.    Historical Provider, MD  valACYclovir (VALTREX) 500 MG tablet Take 500 mg by mouth 2 (two) times daily as needed (outbreaks).    Historical Provider, MD   Meds Ordered and Administered this Visit  Medications - No data to display  BP 120/87 mmHg  Pulse 63  Temp(Src) 98.1 F (36.7 C) (Oral)  Resp 16  SpO2 100%  LMP 12/27/2003 No data found.   Physical Exam  Constitutional: She is oriented to person, place, and time. She appears well-developed and well-nourished. No distress.  HENT:  Right Ear: External ear normal.  Left Ear: External ear normal.  Mouth/Throat: Oropharynx is clear and moist.  Eyes: Pupils are equal, round, and reactive to light.  Neck: Normal range of motion. Neck supple.  Cardiovascular: Normal rate, regular rhythm, normal heart sounds and intact distal pulses.   Pulmonary/Chest: Effort normal and breath sounds normal.  Abdominal: Soft. Bowel sounds are normal.   Lymphadenopathy:    She has no cervical adenopathy.  Neurological: She is alert and oriented to person, place, and time.  Skin: Skin is warm and dry.  Nursing note and vitals reviewed.   ED Course  Procedures (including critical care time)  Labs Review Labs Reviewed - No data to display  Imaging Review No results found.   Visual Acuity Review  Right Eye Distance:   Left Eye Distance:   Bilateral Distance:    Right Eye Near:   Left Eye Near:    Bilateral Near:         MDM   1. Influenza-like illness        Linna HoffJames D Adelyna Brockman, MD 07/24/15 [phone]

## 2015-08-04 ENCOUNTER — Other Ambulatory Visit (HOSPITAL_COMMUNITY)
Admission: RE | Admit: 2015-08-04 | Discharge: 2015-08-04 | Disposition: A | Discharge status: Home / Self Care | Payer: No Typology Code available for payment source | Source: Ambulatory Visit | Attending: Emergency Medicine | Admitting: Emergency Medicine

## 2015-08-04 ENCOUNTER — Encounter (HOSPITAL_COMMUNITY): Payer: Self-pay | Admitting: Emergency Medicine

## 2015-08-04 ENCOUNTER — Emergency Department (INDEPENDENT_AMBULATORY_CARE_PROVIDER_SITE_OTHER)
Admission: EM | Admit: 2015-08-04 | Discharge: 2015-08-04 | Disposition: A | Discharge status: Home / Self Care | Payer: No Typology Code available for payment source | Source: Home / Self Care | Attending: Emergency Medicine | Admitting: Emergency Medicine

## 2015-08-04 DIAGNOSIS — N390 Urinary tract infection, site not specified: Secondary | ICD-10-CM | POA: Insufficient documentation

## 2015-08-04 LAB — POCT URINALYSIS DIP (DEVICE)
Bilirubin Urine: NEGATIVE
Glucose, UA: NEGATIVE mg/dL
Ketones, ur: NEGATIVE mg/dL
Nitrite: NEGATIVE
Protein, ur: 30 mg/dL — AB
Specific Gravity, Urine: 1.01 (ref 1.005–1.030)
Urobilinogen, UA: 0.2 mg/dL (ref 0.0–1.0)
pH: 7 (ref 5.0–8.0)

## 2015-08-04 MED ORDER — CEPHALEXIN 500 MG PO CAPS: 500.0000 mg | ORAL_CAPSULE | Freq: Four times a day (QID) | ORAL | Status: DC

## 2015-08-04 NOTE — ED Notes (Addendum)
Complains of abdominal pain, fever, chills, painful urination.  Onset of symptoms 3/27

## 2015-08-04 NOTE — ED Provider Notes (Signed)
CSN: [id number]     Arrival date & time 08/04/15  1314 History   First MD Initiated Contact with Patient 08/04/15 1504     Chief Complaint  Patient presents with  . Back Pain   (Consider location/radiation/quality/duration/timing/severity/associated sxs/prior Treatment) HPI Comments: 40 year old female is complaining of right low back pain that is worse when leaning over and picking up objects. She is also complaining of dysuria and frequency and pain which seems to begin low in the mid pelvis and radiates into the abdomen left greater than the right. She has had a couple episodes of vomiting with the last one being yesterday. She vomited once. Yesterday she felt hot and was sure she had a fever but she did not take her temperature.  Although she felt to mention her history related to abdominal pain and dysuria it is documented that she has had a total abdominal hysterectomy as well as a right and left salpingo-oophorectomy, bladder neck reconstruction and cesarean section.   Past Medical History  Diagnosis Date  . GERD (gastroesophageal reflux disease)   . Anemia   . Constipation   . Hx of endometriosis   . Macromastia 07/2011  . Migraine    Past Surgical History  Procedure Laterality Date  . Salpingectomy  12/12/2003    right; with TAH  . Cesarean section  12/29/2000; 1994  . Bladder neck reconstruction  01/14/2011    Procedure: BLADDER NECK REPAIR;  Surgeon: Reva Bores, MD;  Location: [facility];  Service: Gynecology;  Laterality: N/A;  Laparoscopic Repair of Incidental Cystotomy  . Ankle arthroscopy  12/29/2007    right; with extensive debridement  . Abdominal hysterectomy  12/12/2003  . Tubal ligation  12/29/2000  . Dilation and curettage of uterus  07/08/2003    open laparoscopy  . Repair peroneal tendons ankle  01/03/2010    repair right subluxing peroneal tendons  . Tonsillectomy  as a child  . Adenoidectomy  05/2011  . Laparoscopic salpingoopherectomy  01/14/2011    left  . Right  oophorectomy      with lysis of adhesions  . Breast reduction surgery  08/05/2011    Procedure: MAMMARY REDUCTION  (BREAST);  Surgeon: Louisa Second, MD;  Location: [facility];  Service: Plastics;  Laterality: Bilateral;  bilateral   Family History  Problem Relation Age of Onset  . Diabetes Mother   . Hypertension Mother   . Thyroid disease Sister    Social History  Substance Use Topics  . Smoking status: Never Smoker   . Smokeless tobacco: Never Used  . Alcohol Use: No   OB History    Gravida Para Term Preterm AB TAB SAB Ectopic Multiple Living   Review of Systems  Constitutional: Positive for fever and activity change.  HENT: Negative.   Respiratory: Negative.  Negative for cough and shortness of breath.   Cardiovascular: Negative for chest pain.  Gastrointestinal: Positive for vomiting and abdominal pain. Negative for constipation.  Genitourinary: Positive for dysuria, urgency, frequency and pelvic pain.  Musculoskeletal: Positive for back pain.  Skin: Negative.   Neurological: Negative.     Allergies  Other  Home Medications   Prior to Admission medications   Medication Sig Start Date End Date Taking? Authorizing Provider  acetaminophen (TYLENOL) 500 MG tablet Take 1,000 mg by mouth every 6 (six) hours as needed for mild pain (pain).     Historical Provider, MD  amitriptyline (ELAVIL) 25 MG tablet Take 1 tablet (25 mg total) by mouth at bedtime. Patient not taking: Reported on 01/01/2015 05/31/14   Suanne Marker, MD  cephALEXin (KEFLEX) 500 MG capsule Take 1 capsule (500 mg total) by mouth 4 (four) times daily. 08/04/15   Hayden Rasmussen, NP  Chlorphen-Pseudoephed-APAP [facility] FLU/COLD PO) Take by mouth.    Historical Provider, MD  docusate sodium (COLACE) 100 MG capsule Take 100 mg by mouth 2 (two) times daily as needed for moderate constipation.     Historical Provider, MD  EPINEPHrine (EPIPEN 2-PAK) 0.3 mg/0.3 mL DEVI Inject 0.3  mLs (0.3 mg total) into the muscle once as needed (for severe allergic reaction). CAll 911 immediately if you have to use this medicine 09/09/12   Francee Piccolo, PA-C  esomeprazole (NEXIUM) 40 MG capsule Take 40 mg by mouth every morning.     Historical Provider, MD  ferrous sulfate 325 (65 FE) MG tablet Take 325 mg by mouth daily with breakfast.    Historical Provider, MD  ibuprofen (ADVIL,MOTRIN) 200 MG tablet Take 200 mg by mouth every 6 (six) hours as needed for moderate pain.    Historical Provider, MD  ipratropium (ATROVENT) 0.06 % nasal spray Place 2 sprays into both nostrils 4 (four) times daily. 07/24/15   Linna Hoff, MD  loratadine (CLARITIN) 10 MG tablet Take 10 mg by mouth every other day.     Historical Provider, MD  polyethylene glycol (MIRALAX / GLYCOLAX) packet Take 17 g by mouth daily as needed for moderate constipation.    Historical Provider, MD  Pseudoeph-Doxylamine-DM-APAP (NYQUIL PO) Take by mouth.    Historical Provider, MD  valACYclovir (VALTREX) 500 MG tablet Take 500 mg by mouth 2 (two) times daily as needed (outbreaks).    Historical Provider, MD   Meds Ordered and Administered this Visit  Medications - No data to display  BP 120/80 mmHg  Pulse 85  Temp(Src) 98.9 F (37.2 C) (Oral)  Resp 16  SpO2 100%  LMP 12/27/2003 No data found.   Physical Exam  Constitutional: She appears well-developed and well-nourished. No distress.  Eyes: Conjunctivae and EOM are normal.  Neck: Normal range of motion. Neck supple.  Cardiovascular: Normal rate, regular rhythm and normal heart sounds.   Pulmonary/Chest: Effort normal and breath sounds normal. No respiratory distress. She has no wheezes.  Abdominal: Soft. Bowel sounds are normal. There is no tenderness. There is no rebound and no guarding.  Musculoskeletal: She exhibits tenderness. She exhibits no edema.  Patient points to the right low paralumbar area as the source of pain. Palpation of this area produces  moderate pain and the patient pushes my hand away during the exam.  Neurological: She is alert. No cranial nerve deficit. She exhibits normal muscle tone.  Skin: Skin is warm and dry.  Psychiatric: She expresses impulsivity.  Poor fund of knowledge and reasoning ability.  Nursing note and vitals reviewed.   ED Course  Procedures (including critical care time)  U/A:Leukocytes large, nitrites negative, blood large, protein 30 mg/dL.  Imaging Review No results found.   Visual Acuity Review  Right Eye Distance:   Left Eye Distance:   Bilateral Distance:    Right Eye Near:   Left Eye Near:    Bilateral Near:         MDM   1. UTI (lower urinary tract infection)    Meds ordered this encounter  Medications  . cephALEXin (KEFLEX) 500 MG capsule    Sig:  Take 1 capsule (500 mg total) by mouth 4 (four) times daily.    Dispense:  28 capsule    Refill:  0    Order Specific Question:  Supervising Provider    Answer:  Charm RingsHONIG, ERIN J [[id number]]    Lots of fluids Follow with your doctor as needed       Hayden RasmussenDavid Brekyn Huntoon, NP 08/04/15 1537

## 2015-08-04 NOTE — Discharge Instructions (Signed)
Antibiotic Medicine °Antibiotic medicines are used to treat infections caused by bacteria. They work by injuring or killing the bacteria that is making you sick. °HOW IS AN ANTIBIOTIC CHOSEN? °An antibiotic is chosen based on many factors. To help your health care provider choose one for you, tell your health care provider if: °· You have any allergies. °· You are pregnant or plan to get pregnant. °· You are breastfeeding. °· You are taking any medicines. These include over-the-counter medicines, prescription medicines, and herbal remedies. °· You have a medical condition or problem you have not already discussed. °Your health care provider will also consider: °· How often the medicine has to be taken. °· Common side effects of the medicine. °· The cost of the medicine. °· The taste of the medicine. °If you have questions about why an antibiotic was chosen, make sure to ask. °FOR HOW LONG SHOULD I TAKE MY ANTIBIOTIC? °Continue to take your antibiotic for as long as told by your health care provider. Do not stop taking it when you feel better. If you stop taking it too soon: °· You may start to feel sick again. °· Your infection may become harder to treat. °· Complications may develop. °WHAT IF I MISS A DOSE? °Try not to miss any doses of medicine. If you miss a dose, take it as soon as possible. However, if it is almost time for the next dose: °· If you are taking 2 doses per day, take the missed dose and the next dose 5 to 6 hours apart. °· If you are taking 3 or more doses per day, take the missed dose and the next dose 2 to 4 hours apart, then go back to the normal schedule. °If you cannot make up a missed dose, take the next scheduled dose on time. Then take the missed dose after you have taken all the doses as recommended by your health care provider, as if you had one more dose left. °DO ANTIBIOTICS AFFECT BIRTH CONTROL? °Birth control pills may not work while you are on antibiotics. If you are taking birth  control pills, continue taking them as usual and use a second form of birth control, such as a condom, to avoid unwanted pregnancy. Continue using the second form of birth control until you are finished with your current 1 month cycle of birth control pills. °OTHER INFORMATION °· If there is any medicine left over, throw it away. °· Never take someone else's antibiotics. °· Never take leftover antibiotics. °SEEK MEDICAL CARE IF: °· You get worse. °· You do not feel better within a few days of starting the antibiotic medicine. °· You vomit. °· White patches appear in your mouth. °· You have new joint pain that begins after starting the antibiotic. °· You have new muscle aches that begin after starting the antibiotic. °· You had a fever before starting the antibiotic and it returns. °· You have any symptoms of an allergic reaction, such as an itchy rash. If this happens, stop taking the antibiotic. °SEEK IMMEDIATE MEDICAL CARE IF: °· Your urine turns dark or becomes blood-colored. °· Your skin turns yellow. °· You bruise or bleed easily. °· You have severe diarrhea and abdominal cramps. °· You have a severe headache. °· You have signs of a severe allergic reaction, such as: °¨ Trouble breathing. °¨ Wheezing. °¨ Swelling of the lips, tongue, or face. °¨ Fainting. °¨ Blisters on the skin or in the mouth. °If you have signs of a severe allergic   reaction, stop taking the antibiotic right away. °  °This information is not intended to replace advice given to you by your health care provider. Make sure you discuss any questions you have with your health care provider. °  °Document Released: 01/03/2004 Document Revised: 01/11/2015 Document Reviewed: 09/07/2014 °Elsevier Interactive Patient Education ©2016 Elsevier Inc. ° °Urinary Tract Infection °A urinary tract infection (UTI) can occur any place along the urinary tract. The tract includes the kidneys, ureters, bladder, and urethra. A type of germ called bacteria often  causes a UTI. UTIs are often helped with antibiotic medicine.  °HOME CARE  °· If given, take antibiotics as told by your doctor. Finish them even if you start to feel better. °· Drink enough fluids to keep your pee (urine) clear or pale yellow. °· Avoid tea, drinks with caffeine, and bubbly (carbonated) drinks. °· Pee often. Avoid holding your pee in for a long time. °· Pee before and after having sex (intercourse). °· Wipe from front to back after you poop (bowel movement) if you are a woman. Use each tissue only once. °GET HELP RIGHT AWAY IF:  °· You have back pain. °· You have lower belly (abdominal) pain. °· You have chills. °· You feel sick to your stomach (nauseous). °· You throw up (vomit). °· Your burning or discomfort with peeing does not go away. °· You have a fever. °· Your symptoms are not better in 3 days. °MAKE SURE YOU:  °· Understand these instructions. °· Will watch your condition. °· Will get help right away if you are not doing well or get worse. °  °This information is not intended to replace advice given to you by your health care provider. Make sure you discuss any questions you have with your health care provider. °  °Document Released: 10/09/2007 Document Revised: 05/13/2014 Document Reviewed: 11/21/2011 °Elsevier Interactive Patient Education ©2016 Elsevier Inc. ° °

## 2015-08-07 ENCOUNTER — Telehealth: Payer: Self-pay | Admitting: Internal Medicine

## 2015-08-07 DIAGNOSIS — N390 Urinary tract infection, site not specified: Secondary | ICD-10-CM

## 2015-08-07 LAB — URINE CULTURE: Culture: >=100000

## 2015-08-07 MED ORDER — SULFAMETHOXAZOLE-TRIMETHOPRIM 800-160 MG PO TABS: 1.0000 | ORAL_TABLET | Freq: Two times a day (BID) | ORAL | Status: AC

## 2015-08-07 NOTE — ED Notes (Signed)
Clinical staff, please let patient know that urine cx is growing enterobacter resistant to cephalexin.  Will send rx for trimethoprim/sulfamethoxazole to pharmacy of record ([facility] [address]).  Recheck or followup pcp/Robyn Sanders for persistent symptoms.  LM  Eustace MooreLaura W Darius Lundberg, MD 08/07/15 1104

## 2015-08-08 NOTE — ED Notes (Addendum)
Patient came by office to question why she has two antibiotics called into pharmacy. I explained to her the urine culture showed bacteria resistant to her antibiotics which require a chage in therapy. Patient verbalized understanding. Requested Rx be sent to [facility] on [address], I called Rx into pharmacy.

## 2015-08-09 ENCOUNTER — Telehealth (HOSPITAL_COMMUNITY): Payer: Self-pay | Admitting: Emergency Medicine

## 2015-08-09 NOTE — ED Notes (Unsigned)
Called pt and notified of recent lab results from visit 3/31 Pt ID'd properly... Reports feeling better and sx have subsided but still having back pain  Per Dr. Dayton ScrapeMurray,  Clinical staff, please let patient know that urine cx is growing enterobacter resistant to cephalexin. Stop cephalexin. Will send rx for trimethoprim/sulfamethoxazole to pharmacy [facility] record ([facility] [address]). Recheck or followup pcp/Robyn Sanders for persistent symptoms. LM  Adv pt if sx are not getting better to return  Pt verb understanding

## 2015-09-08 ENCOUNTER — Ambulatory Visit: Payer: No Typology Code available for payment source | Attending: Internal Medicine

## 2015-11-01 ENCOUNTER — Emergency Department (HOSPITAL_COMMUNITY): Payer: Self-pay

## 2015-11-01 ENCOUNTER — Emergency Department (HOSPITAL_COMMUNITY)
Admission: EM | Admit: 2015-11-01 | Discharge: 2015-11-01 | Disposition: A | Discharge status: Home / Self Care | Payer: Self-pay | Attending: Emergency Medicine | Admitting: Emergency Medicine

## 2015-11-01 ENCOUNTER — Encounter (HOSPITAL_COMMUNITY): Payer: Self-pay | Admitting: Emergency Medicine

## 2015-11-01 DIAGNOSIS — Z791 Long term (current) use of non-steroidal anti-inflammatories (NSAID): Secondary | ICD-10-CM | POA: Insufficient documentation

## 2015-11-01 DIAGNOSIS — R110 Nausea: Secondary | ICD-10-CM | POA: Insufficient documentation

## 2015-11-01 DIAGNOSIS — R319 Hematuria, unspecified: Secondary | ICD-10-CM | POA: Insufficient documentation

## 2015-11-01 DIAGNOSIS — N390 Urinary tract infection, site not specified: Secondary | ICD-10-CM | POA: Insufficient documentation

## 2015-11-01 DIAGNOSIS — Z79899 Other long term (current) drug therapy: Secondary | ICD-10-CM | POA: Insufficient documentation

## 2015-11-01 LAB — CBC
HCT: 36.1 % (ref 36.0–46.0)
Hemoglobin: 11.3 g/dL — ABNORMAL LOW (ref 12.0–15.0)
MCH: 22.7 pg — ABNORMAL LOW (ref 26.0–34.0)
MCHC: 31.3 g/dL (ref 30.0–36.0)
MCV: 72.6 fL — ABNORMAL LOW (ref 78.0–100.0)
Platelets: 362 10*3/uL (ref 150–400)
RBC: 4.97 MIL/uL (ref 3.87–5.11)
RDW: 15.2 % (ref 11.5–15.5)
WBC: 8.8 10*3/uL (ref 4.0–10.5)

## 2015-11-01 LAB — I-STAT CHEM 8, ED
BUN: <3 mg/dL — ABNORMAL LOW (ref 6–20)
Calcium, Ion: 1.18 mmol/L (ref 1.13–1.30)
Chloride: 102 mmol/L (ref 101–111)
Creatinine, Ser: 0.7 mg/dL (ref 0.44–1.00)
Glucose, Bld: 80 mg/dL (ref 65–99)
HCT: 38 % (ref 36.0–46.0)
Hemoglobin: 12.9 g/dL (ref 12.0–15.0)
Potassium: 3.7 mmol/L (ref 3.5–5.1)
Sodium: 140 mmol/L (ref 135–145)
TCO2: 28 mmol/L (ref 0–100)

## 2015-11-01 LAB — URINALYSIS, ROUTINE W REFLEX MICROSCOPIC
Bilirubin Urine: NEGATIVE
Glucose, UA: NEGATIVE mg/dL
Ketones, ur: NEGATIVE mg/dL
Nitrite: NEGATIVE
Protein, ur: NEGATIVE mg/dL
Specific Gravity, Urine: 1.006 (ref 1.005–1.030)
pH: 6 (ref 5.0–8.0)

## 2015-11-01 LAB — URINE MICROSCOPIC-ADD ON

## 2015-11-01 MED ORDER — FENTANYL CITRATE (PF) 100 MCG/2ML IJ SOLN
50.0000 ug | Freq: Once | INTRAMUSCULAR | Status: AC
  Administered 2015-11-01: 50 ug via INTRAVENOUS
  Filled 2015-11-01: qty 2

## 2015-11-01 MED ORDER — SULFAMETHOXAZOLE-TRIMETHOPRIM 800-160 MG PO TABS: 1.0000 | ORAL_TABLET | Freq: Two times a day (BID) | ORAL | Status: AC

## 2015-11-01 NOTE — Discharge Instructions (Signed)
Hematuria, Adult °Hematuria is blood in your urine. It can be caused by a bladder infection, kidney infection, prostate infection, kidney stone, or cancer of your urinary tract. Infections can usually be treated with medicine, and a kidney stone usually will pass through your urine. If neither of these is the cause of your hematuria, further workup to find out the reason may be needed. °It is very important that you tell your health care provider about any blood you see in your urine, even if the blood stops without treatment or happens without causing pain. Blood in your urine that happens and then stops and then happens again can be a symptom of a very serious condition. Also, pain is not a symptom in the initial stages of many urinary cancers. °HOME CARE INSTRUCTIONS  °· Drink lots of fluid, 3-4 quarts a day. If you have been diagnosed with an infection, cranberry juice is especially recommended, in addition to large amounts of water. °· Avoid caffeine, tea, and carbonated beverages because they tend to irritate the bladder. °· Avoid alcohol because it may irritate the prostate. °· Take all medicines as directed by your health care provider. °· If you were prescribed an antibiotic medicine, finish it all even if you start to feel better. °· If you have been diagnosed with a kidney stone, follow your health care provider's instructions regarding straining your urine to catch the stone. °· Empty your bladder often. Avoid holding urine for long periods of time. °· After a bowel movement, women should cleanse front to back. Use each tissue only once. °· Empty your bladder before and after sexual intercourse if you are a female. °SEEK MEDICAL CARE IF: °· You develop back pain. °· You have a fever. °· You have a feeling of sickness in your stomach (nausea) or vomiting. °· Your symptoms are not better in 3 days. Return sooner if you are getting worse. °SEEK IMMEDIATE MEDICAL CARE IF:  °· You develop severe vomiting and  are unable to keep the medicine down. °· You develop severe back or abdominal pain despite taking your medicines. °· You begin passing a large amount of blood or clots in your urine. °· You feel extremely weak or faint, or you pass out. °MAKE SURE YOU:  °· Understand these instructions. °· Will watch your condition. °· Will get help right away if you are not doing well or get worse. °  °This information is not intended to replace advice given to you by your health care provider. Make sure you discuss any questions you have with your health care provider. °  °Document Released: 04/22/2005 Document Revised: 05/13/2014 Document Reviewed: 12/21/2012 °Elsevier Interactive Patient Education ©2016 Elsevier Inc. ° °Urinary Tract Infection °Urinary tract infections (UTIs) can develop anywhere along your urinary tract. Your urinary tract is your body's drainage system for removing wastes and extra water. Your urinary tract includes two kidneys, two ureters, a bladder, and a urethra. Your kidneys are a pair of bean-shaped organs. Each kidney is about the size of your fist. They are located below your ribs, one on each side of your spine. °CAUSES °Infections are caused by microbes, which are microscopic organisms, including fungi, viruses, and bacteria. These organisms are so small that they can only be seen through a microscope. Bacteria are the microbes that most commonly cause UTIs. °SYMPTOMS  °Symptoms of UTIs may vary by age and gender of the patient and by the location of the infection. Symptoms in young women typically include a frequent   and intense urge to urinate and a painful, burning feeling in the bladder or urethra during urination. Older women and men are more likely to be tired, shaky, and weak and have muscle aches and abdominal pain. A fever may mean the infection is in your kidneys. Other symptoms of a kidney infection include pain in your back or sides below the ribs, nausea, and vomiting. °DIAGNOSIS °To  diagnose a UTI, your caregiver will ask you about your symptoms. Your caregiver will also ask you to provide a urine sample. The urine sample will be tested for bacteria and white blood cells. White blood cells are made by your body to help fight infection. °TREATMENT  °Typically, UTIs can be treated with medication. Because most UTIs are caused by a bacterial infection, they usually can be treated with the use of antibiotics. The choice of antibiotic and length of treatment depend on your symptoms and the type of bacteria causing your infection. °HOME CARE INSTRUCTIONS °· If you were prescribed antibiotics, take them exactly as your caregiver instructs you. Finish the medication even if you feel better after you have only taken some of the medication. °· Drink enough water and fluids to keep your urine clear or pale yellow. °· Avoid caffeine, tea, and carbonated beverages. They tend to irritate your bladder. °· Empty your bladder often. Avoid holding urine for long periods of time. °· Empty your bladder before and after sexual intercourse. °· After a bowel movement, women should cleanse from front to back. Use each tissue only once. °SEEK MEDICAL CARE IF:  °· You have back pain. °· You develop a fever. °· Your symptoms do not begin to resolve within 3 days. °SEEK IMMEDIATE MEDICAL CARE IF:  °· You have severe back pain or lower abdominal pain. °· You develop chills. °· You have nausea or vomiting. °· You have continued burning or discomfort with urination. °MAKE SURE YOU:  °· Understand these instructions. °· Will watch your condition. °· Will get help right away if you are not doing well or get worse. °  °This information is not intended to replace advice given to you by your health care provider. Make sure you discuss any questions you have with your health care provider. °  °Document Released: 01/30/2005 Document Revised: 01/11/2015 Document Reviewed: 05/31/2011 °Elsevier Interactive Patient Education ©2016  Elsevier Inc. ° °

## 2015-11-01 NOTE — ED Notes (Signed)
Pt c/o LLQ abdominal pain, nausea, urinary frequency, anorexia onset 1 week ago, worsened during urination. Hematuria a few days ago lasting one day, since resolved. No emesis, diarrhea.

## 2015-11-01 NOTE — Progress Notes (Addendum)
Pt confirmed with ED Cm "I have been seeing a few doctors. [facility] with [facility] but I do not know the name" In reference to Dorothyann Pengobyn Sanders pt states she has not seen this provider "in a few years. I do have an orange card" Pt [facility]  Pt agreed to have Cm updated pcp as SCC and remove Sanders  CM reviewed EPIC to find upcoming appt info Entered in d/c instructions  Massie MaroonHollis, Lachina M Go on 12/06/2015 You have a scheduled appt on 12/06/15 at 11 am to see Nurse practitioner, Julianne HandlerLachina Hollis [address]  [place] [id number] [phone]

## 2015-11-01 NOTE — ED Provider Notes (Signed)
CSN: [id number]     Arrival date & time 11/01/15  [id number] History   First MD Initiated Contact with Patient 11/01/15 1010     Chief Complaint  Patient presents with  . Abdominal Pain      Patient is a 40 y.o. female presenting with abdominal pain. The history is provided by the patient.  Abdominal Pain Associated symptoms: dysuria and nausea   Associated symptoms: no chest pain, no cough, no diarrhea and no vomiting   Patient presents with left-sided lower abdominal pain. Began a few days ago. States she's also had urinary frequency. Slight vaginal discharge. Previous hysterectomy. Slight nausea without vomiting. No diarrhea or constipation. No fevers or chills. She states she has had a urinary tract infection before this does feel little different.    Past Medical History  Diagnosis Date  . GERD (gastroesophageal reflux disease)   . Anemia   . Constipation   . Hx of endometriosis   . Macromastia 07/2011  . Migraine    Past Surgical History  Procedure Laterality Date  . Salpingectomy  12/12/2003    right; with TAH  . Cesarean section  12/29/2000; 1994  . Bladder neck reconstruction  01/14/2011    Procedure: BLADDER NECK REPAIR;  Surgeon: Reva Boresanya S Pratt, MD;  Location: [facility];  Service: Gynecology;  Laterality: N/A;  Laparoscopic Repair of Incidental Cystotomy  . Ankle arthroscopy  12/29/2007    right; with extensive debridement  . Abdominal hysterectomy  12/12/2003  . Tubal ligation  12/29/2000  . Dilation and curettage of uterus  07/08/2003    open laparoscopy  . Repair peroneal tendons ankle  01/03/2010    repair right subluxing peroneal tendons  . Tonsillectomy  as a child  . Adenoidectomy  05/2011  . Laparoscopic salpingoopherectomy  01/14/2011    left  . Right oophorectomy      with lysis of adhesions  . Breast reduction surgery  08/05/2011    Procedure: MAMMARY REDUCTION  (BREAST);  Surgeon: Louisa SecondGerald Truesdale, MD;  Location: [facility];  Service: Plastics;  Laterality:  Bilateral;  bilateral   Family History  Problem Relation Age of Onset  . Diabetes Mother   . Hypertension Mother   . Thyroid disease Sister    Social History  Substance Use Topics  . Smoking status: Never Smoker   . Smokeless tobacco: Never Used  . Alcohol Use: No   OB History    Gravida Para Term Preterm AB TAB SAB Ectopic Multiple Living   2 2 2       2      Review of Systems  Constitutional: Negative for appetite change.  Respiratory: Negative for cough.   Cardiovascular: Negative for chest pain.  Gastrointestinal: Positive for nausea and abdominal pain. Negative for vomiting and diarrhea.  Genitourinary: Positive for dysuria. Negative for flank pain.  Musculoskeletal: Negative for neck pain.  Skin: Negative for wound.  Neurological: Negative for numbness.      Allergies  Other  Home Medications   Prior to Admission medications   Medication Sig Start Date End Date Taking? Authorizing Provider  acetaminophen (TYLENOL) 500 MG tablet Take 1,000 mg by mouth every 6 (six) hours as needed for mild pain (pain).    Yes Historical Provider, MD  docusate sodium (COLACE) 100 MG capsule Take 100 mg by mouth 2 (two) times daily as needed for moderate constipation.    Yes Historical Provider, MD  EPINEPHrine (EPIPEN 2-PAK) 0.3 mg/0.3 mL DEVI Inject 0.3 mLs (0.3  mg total) into the muscle once as needed (for severe allergic reaction). CAll 911 immediately if you have to use this medicine 09/09/12  Yes Victorino DikeJennifer Piepenbrink, PA-C  esomeprazole (NEXIUM) 40 MG capsule Take 40 mg by mouth every morning.    Yes Historical Provider, MD  ferrous sulfate 325 (65 FE) MG tablet Take 325 mg by mouth every other day.    Yes Historical Provider, MD  ibuprofen (ADVIL,MOTRIN) 200 MG tablet Take 200-600 mg by mouth every 6 (six) hours as needed for moderate pain.    Yes Historical Provider, MD  ipratropium (ATROVENT) 0.06 % nasal spray Place 2 sprays into both nostrils 4 (four) times daily. Patient  taking differently: Place 2 sprays into both nostrils 2 (two) times daily as needed for rhinitis (allergies).  07/24/15  Yes Linna HoffJames D Kindl, MD  loratadine (CLARITIN) 10 MG tablet Take 10 mg by mouth every other day.    Yes Historical Provider, MD  polyethylene glycol (MIRALAX / GLYCOLAX) packet Take 17 g by mouth daily as needed for moderate constipation.   Yes Historical Provider, MD  valACYclovir (VALTREX) 500 MG tablet Take 500 mg by mouth 2 (two) times daily as needed (outbreaks).   Yes Historical Provider, MD  amitriptyline (ELAVIL) 25 MG tablet Take 1 tablet (25 mg total) by mouth at bedtime. Patient not taking: Reported on 01/01/2015 05/31/14   Suanne MarkerVikram R Penumalli, MD  cephALEXin (KEFLEX) 500 MG capsule Take 1 capsule (500 mg total) by mouth 4 (four) times daily. Patient not taking: Reported on 11/01/2015 08/04/15   Hayden Rasmussenavid Mabe, NP  sulfamethoxazole-trimethoprim (BACTRIM DS,SEPTRA DS) 800-160 MG tablet Take 1 tablet by mouth 2 (two) times daily. 11/01/15 11/08/15  Benjiman CoreNathan Sharlyne Koeneman, MD   BP 109/80 mmHg  Pulse 67  Temp(Src) 98.3 F (36.8 C) (Oral)  Resp 18  SpO2 100%  LMP 12/27/2003 Physical Exam  Constitutional: She appears well-developed.  HENT:  Head: Normocephalic.  Neck: Neck supple.  Cardiovascular: Normal rate.   Pulmonary/Chest: Effort normal.  Abdominal: Soft. She exhibits no mass. There is no tenderness.  Musculoskeletal: Normal range of motion. She exhibits no edema.  Neurological: She is alert.  Skin: Skin is warm.    ED Course  Procedures (including critical care time) Labs Review Labs Reviewed  CBC - Abnormal; Notable for the following:    Hemoglobin 11.3 (*)    MCV 72.6 (*)    MCH 22.7 (*)    All other components within normal limits  URINALYSIS, ROUTINE W REFLEX MICROSCOPIC (NOT AT [facility]) - Abnormal; Notable for the following:    APPearance CLOUDY (*)    Hgb urine dipstick MODERATE (*)    Leukocytes, UA MODERATE (*)    All other components within normal limits   URINE MICROSCOPIC-ADD ON - Abnormal; Notable for the following:    Squamous Epithelial / LPF 0-5 (*)    Bacteria, UA FEW (*)    All other components within normal limits  I-STAT CHEM 8, ED - Abnormal; Notable for the following:    BUN <3 (*)    All other components within normal limits  URINE CULTURE    Imaging Review Ct Renal Stone Study  11/01/2015  CLINICAL DATA:  Left flank and left lower quadrant abdominal pain with nausea and urinary frequency. No history of calculi. Hematuria last week. History of bladder neck reconstruction in 2012. EXAM: CT ABDOMEN AND PELVIS WITHOUT CONTRAST TECHNIQUE: Multidetector CT imaging of the abdomen and pelvis was performed following the standard protocol without IV contrast.  COMPARISON:  CT 11/12/2013 and 02/03/2010. FINDINGS: Lower chest: Clear lung bases. No significant pleural or pericardial effusion. Hepatobiliary: The liver appears unremarkable as imaged in the noncontrast state. No focal abnormality is seen to correspond with the lesion in the left lobe on prior contrast-enhanced study. No significant biliary dilatation status post cholecystectomy. Pancreas: Unremarkable. No pancreatic ductal dilatation or surrounding inflammatory changes. Spleen: Normal in size without focal abnormality. Adrenals/Urinary Tract: Both adrenal glands appear normal. Both kidneys appear unremarkable as imaged in the noncontrast state. There is no evidence of renal or ureteral calculus. There is no hydronephrosis or perinephric soft tissue stranding. There is diffuse bladder wall thickening with probable calcifications or postsurgical changes in the bladder lumen wall superolaterally on the left. No focal mass lesion identified. Stomach/Bowel: No evidence of bowel wall thickening, distention or surrounding inflammatory change. Appendix not clearly seen. Vascular/Lymphatic: There are no enlarged abdominal or pelvic lymph nodes. No vascular abnormalities identified on noncontrast  imaging. Reproductive: Hysterectomy.  No evidence of adnexal mass. Other: No significant residual ascites. Postsurgical changes around the umbilicus are stable. Musculoskeletal: No acute or significant osseous findings. IMPRESSION: 1. No definite acute findings. 2. Compared with the prior study, ascites has resolved. 3. Bladder wall thickening with postsurgical changes or calcification in the bladder wall superolaterally. These are progressive compared with the prior examination and could be postinflammatory if not postsurgical. At the very least, correlation with urine analysis, including urine cytology recommended. Cystoscopy may be warranted. Electronically Signed   By: Carey Bullocks M.D.   On: 11/01/2015 13:50   I have personally reviewed and evaluated these images and lab results as part of my medical decision-making.   EKG Interpretation None      MDM   Final diagnoses:  Acute lower UTI (urinary tract infection)  Hematuria    Patient with abdominal pain. Urinary tract infection. Hematuria. Has bladder thickening. Previous surgery on her bladder but with hematuria will need following. Will have follow with urology. Culture sent. Will discharge home.    Benjiman Core, MD 11/01/15 1505

## 2015-11-03 LAB — URINE CULTURE: Culture: >=100000 — AB

## 2015-11-04 ENCOUNTER — Telehealth (HOSPITAL_BASED_OUTPATIENT_CLINIC_OR_DEPARTMENT_OTHER): Payer: Self-pay

## 2015-11-04 NOTE — Telephone Encounter (Signed)
Post ED Visit - Positive Culture Follow-up  Culture report reviewed by antimicrobial stewardship pharmacist:  []  Enzo BiNathan Batchelder, Pharm.D. []  Celedonio MiyamotoJeremy Frens, Pharm.D., BCPS []  Garvin FilaMike Maccia, Pharm.D. []  Georgina PillionElizabeth Martin, Pharm.D., BCPS []  HarrisMinh Pham, [address]Pharm[address]D., BCPS, AAHIVP []  Estella HuskMichelle Turner, Pharm.D., BCPS, AAHIVP [x]  Cassie Stewart, Pharm.D. []  Sherle Poeob Vincent, [address]Pharm[address]  Positive urine culture Treated with Sulfamethoxazole-Trimethoprim, organism sensitive to the same and no further patient follow-up is required at this time.  Jerry CarasCullom, Seena Face Burnett 11/04/2015, 2:00 PM

## 2015-12-06 ENCOUNTER — Ambulatory Visit (INDEPENDENT_AMBULATORY_CARE_PROVIDER_SITE_OTHER): Payer: No Typology Code available for payment source | Admitting: Family Medicine

## 2015-12-06 ENCOUNTER — Other Ambulatory Visit: Payer: Self-pay | Admitting: Family Medicine

## 2015-12-06 ENCOUNTER — Encounter: Payer: Self-pay | Admitting: Family Medicine

## 2015-12-06 VITALS — BP 128/86 | HR 70 | Resp 16 | Ht 65.0 in | Wt 203.0 lb

## 2015-12-06 DIAGNOSIS — D649 Anemia, unspecified: Secondary | ICD-10-CM

## 2015-12-06 DIAGNOSIS — Z0000 Encounter for general adult medical examination without abnormal findings: Secondary | ICD-10-CM

## 2015-12-06 DIAGNOSIS — B009 Herpesviral infection, unspecified: Secondary | ICD-10-CM

## 2015-12-06 DIAGNOSIS — Z889 Allergy status to unspecified drugs, medicaments and biological substances status: Secondary | ICD-10-CM

## 2015-12-06 DIAGNOSIS — N390 Urinary tract infection, site not specified: Secondary | ICD-10-CM

## 2015-12-06 DIAGNOSIS — Z1231 Encounter for screening mammogram for malignant neoplasm of breast: Secondary | ICD-10-CM

## 2015-12-06 DIAGNOSIS — Z23 Encounter for immunization: Secondary | ICD-10-CM

## 2015-12-06 DIAGNOSIS — K219 Gastro-esophageal reflux disease without esophagitis: Secondary | ICD-10-CM

## 2015-12-06 LAB — LIPID PANEL
Cholesterol: 177 mg/dL (ref 125–200)
HDL: 55 mg/dL (ref >=46)
LDL Cholesterol: 109 mg/dL (ref <130)
Total CHOL/HDL Ratio: 3.2 Ratio (ref <=5.0)
Triglycerides: 64 mg/dL (ref <150)
VLDL: 13 mg/dL (ref <30)

## 2015-12-06 LAB — TSH: TSH: 1.19 mIU/L

## 2015-12-06 MED ORDER — LORATADINE 10 MG PO TABS: 10.0000 mg | ORAL_TABLET | ORAL | 3 refills | Status: DC

## 2015-12-06 MED ORDER — ESOMEPRAZOLE MAGNESIUM 40 MG PO CPDR: 40.0000 mg | DELAYED_RELEASE_CAPSULE | Freq: Every morning | ORAL | 1 refills | Status: DC

## 2015-12-06 MED ORDER — FERROUS SULFATE 325 (65 FE) MG PO TABS: 325.0000 mg | ORAL_TABLET | ORAL | 3 refills | Status: DC

## 2015-12-06 MED ORDER — VALACYCLOVIR HCL 500 MG PO TABS: 500.0000 mg | ORAL_TABLET | Freq: Two times a day (BID) | ORAL | 1 refills | Status: DC | PRN

## 2015-12-06 MED FILL — ESOMEPRAZOLE MAG DR 40 MG C: 40 | 30 days supply | Qty: 30 | Fill #0

## 2015-12-06 MED FILL — FERROUS SULFATE 325 MG TAB: 325 (65 FE) | 60 days supply | Qty: 30 | Fill #0

## 2015-12-06 NOTE — Patient Instructions (Signed)
I have sent your prescriptions to [facility] at [address].

## 2015-12-06 NOTE — Progress Notes (Signed)
[name], is a 40 y.o. female  [id number]  [id number]  DOB - 27-Oct-1975  CC:  Chief Complaint  Patient presents with  . Establish Care    chronic bladder infections   . Insomnia       HPI: [name] is a 40 y.o. female here to establish care. Patient has been seen in ED twice in the last few months and treated for UTI. They recommended she follow-up with urology. She has had some type of surgical procedure in the past. She is here to establish care with her [facility] and receive referral to urology and dentist.  She has a history of amenia, GERD, migraine headaches, constipation and insomnia, in addition to the UTI. She reports having tried amitripyline and trazadone which have not helped. Part of the problem seems to be with her work schedule. 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 She reports this is not currently giving her any probem. She rarely has headaches. She also has a history of genital herpes.  She is needing refills of Valtrex, Nexium, Ferrous Sulfate and Claritin.  Allergies  Allergen Reactions  . Other     Seasonal allergies    Past Medical History:  Diagnosis Date  . Anemia   . Constipation   . GERD (gastroesophageal reflux disease)   . Hx of endometriosis   . Macromastia 07/2011  . Migraine    Current Outpatient Prescriptions on File Prior to Visit  Medication Sig Dispense Refill  . acetaminophen (TYLENOL) 500 MG tablet Take 1,000 mg by mouth every 6 (six) hours as needed for mild pain (pain).     [name] docusate sodium (COLACE) 100 MG capsule Take 100 mg by mouth 2 (two) times daily as needed for moderate constipation.     [name] EPINEPHrine (EPIPEN 2-PAK) 0.3 mg/0.3 mL DEVI Inject 0.3 mLs (0.3 mg total) into the muscle once as needed (for severe allergic reaction). CAll 911 immediately if you have to use this medicine 1 Device 1  .  ibuprofen (ADVIL,MOTRIN) 200 MG tablet Take 200-600 mg by mouth every 6 (six) hours as needed for moderate pain.     . polyethylene glycol (MIRALAX / GLYCOLAX) packet Take 17 g by mouth daily as needed for moderate constipation.    [name] amitriptyline (ELAVIL) 25 MG tablet Take 1 tablet (25 mg total) by mouth at bedtime. (Patient not taking: Reported on 01/01/2015) 30 tablet 6  . cephALEXin (KEFLEX) 500 MG capsule Take 1 capsule (500 mg total) by mouth 4 (four) times daily. (Patient not taking: Reported on 11/01/2015) 28 capsule 0  . ipratropium (ATROVENT) 0.06 % nasal spray Place 2 sprays into both nostrils 4 (four) times daily. (Patient not taking: Reported on 12/06/2015) 15 mL 1   No current facility-administered medications on file prior to visit.    Family History  Problem Relation Age of Onset  . Diabetes Mother   . Hypertension Mother   . Thyroid disease Sister    Social History   Social History  . Marital status: Divorced    Spouse name: N/A  . Number of children: 2  . Years of education: 12th   Occupational History  .  Other    [employer]   Social History Main Topics  . Smoking status: Never Smoker  . Smokeless tobacco: Never Used  . Alcohol use No  . Drug use:  No  . Sexual activity: Not on file   Other Topics Concern  . Not on file   Social History Narrative   Patient lives at home with family.   Caffeine Use: 1 cup daily    Review of Systems: Constitutional: Negative for fever, chills, appetite change, weight loss. Positive for fatigue Skin: Negative for rashes or lesions of concern. HENT: Negative for ear pain, ear discharge.nose bleeds Eyes: Negative for pain, discharge, redness, itching and visual disturbance. Neck: Negative for pain, stiffness Respiratory: Negative for cough, shortness of breath,   Cardiovascular: Negative for chest pain, palpitations and leg swelling. Gastrointestinal: Negative for abdominal pain, nausea, vomiting, diarrhea. Positive for  constipation and heartburn Genitourinary: Negative for dysuria, urgency, frequency, hematuria. Positive for frequent UTIS Musculoskeletal: Negative for back pain, joint pain, joint  swelling, and gait problem.Negative for weakness. Neurological: Negative for dizziness, tremors, seizures, syncope,   light-headedness, numbness and headaches.  Hematological: Negative for easy  Bleeding. Positive for easy bruising. Psychiatric/Behavioral: Positive for anxiety and insomnia   Objective:   Vitals:   12/06/15 1113  BP: 128/86  Pulse: 70  Resp: 16    Physical Exam: Constitutional: Patient appears well-developed and well-nourished. No distress. HENT: Normocephalic, atraumatic, External right and left ear normal. Oropharynx is clear and moist.  Eyes: Conjunctivae and EOM are normal. PERRLA, no scleral icterus. Neck: Normal ROM. Neck supple. No lymphadenopathy, No thyromegaly. CVS: RRR, S1/S2 +, no murmurs, no gallops, no rubs Pulmonary: Effort and breath sounds normal, no stridor, rhonchi, wheezes, rales.  Abdominal: Soft. Normoactive BS,, no distension, tenderness, rebound or guarding.  Musculoskeletal: Normal range of motion. No edema and no tenderness.  Neuro: Alert.Normal muscle tone coordination. Non-focal Skin: Skin is warm and dry. No rash noted. Not diaphoretic. No erythema. No pallor. Psychiatric: Normal mood and affect. Behavior, judgment, thought content normal.  Lab Results  Component Value Date   WBC 8.8 11/01/2015   HGB 12.9 11/01/2015   HCT 38.0 11/01/2015   MCV 72.6 (L) 11/01/2015   PLT 362 11/01/2015   Lab Results  Component Value Date   CREATININE 0.70 11/01/2015   BUN <3 (L) 11/01/2015   NA 140 11/01/2015   K 3.7 11/01/2015   CL 102 11/01/2015   CO2 30 06/26/2014    No results found for: HGBA1C Lipid Panel  No results found for: CHOL, TRIG, HDL, CHOLHDL, VLDL, LDLCALC     Assessment and plan:   1. Healthcare maintenance  - Lipid panel - TSH - HIV  antibody (with reflex) - MM DIGITAL SCREENING BILATERAL; Future  2. H/O seasonal allergies  - loratadine (CLARITIN) 10 MG tablet; Take 1 tablet (10 mg total) by mouth every other day.  Dispense: 30 tablet; Refill: 3  3. Anemia, unspecified anemia type  - ferrous sulfate 325 (65 FE) MG tablet; Take 1 tablet (325 mg total) by mouth every other day.  Dispense: 30 tablet; Refill: 3  4. Gastroesophageal reflux disease, esophagitis presence not specified  - esomeprazole (NEXIUM) 40 MG capsule; Take 1 capsule (40 mg total) by mouth every morning.  Dispense: 90 capsule; Refill: 1  5. HSV-2 infection  - valACYclovir (VALTREX) 500 MG tablet; Take 1 tablet (500 mg total) by mouth 2 (two) times daily as needed (outbreaks).  Dispense: 30 tablet; Refill: 1  6. Frequent UTI  - Ambulatory referral to Urology   Return in about 6 months (around 06/07/2016).  The patient was given clear instructions to go to ER or return to medical center if  symptoms don't improve, worsen or new problems develop. The patient verbalized understanding.    Henrietta Hoover FNP  12/06/2015, 12:09 PM

## 2015-12-07 LAB — HIV ANTIBODY (ROUTINE TESTING W REFLEX): HIV 1&2 Ab, 4th Generation: NONREACTIVE

## 2015-12-08 ENCOUNTER — Telehealth: Payer: Self-pay

## 2015-12-08 ENCOUNTER — Other Ambulatory Visit: Payer: Self-pay | Admitting: Family Medicine

## 2015-12-08 NOTE — Telephone Encounter (Signed)
Pt called and states that she has been taking Nexium 40 mg twice per day instead of order for once per day; Please advise

## 2015-12-08 NOTE — Telephone Encounter (Signed)
Please advise. [name]

## 2016-01-09 MED FILL — ESOMEPRAZOLE MAG DR 40 MG C: 40 | 30 days supply | Qty: 30 | Fill #1

## 2016-01-15 ENCOUNTER — Telehealth: Payer: Self-pay | Admitting: Family Medicine

## 2016-01-15 NOTE — Telephone Encounter (Signed)
Fax from [facility], patient has appointment 01/31/16.

## 2016-02-05 MED FILL — ESOMEPRAZOLE MAG DR 40 MG C: 40 | 30 days supply | Qty: 30 | Fill #2

## 2016-02-06 MED FILL — ?VALACYCLOVIR HCL 500MG TAB: 500 | 15 days supply | Qty: 30 | Fill #0

## 2016-02-15 ENCOUNTER — Ambulatory Visit (INDEPENDENT_AMBULATORY_CARE_PROVIDER_SITE_OTHER): Payer: No Typology Code available for payment source | Admitting: Family Medicine

## 2016-02-15 ENCOUNTER — Encounter: Payer: Self-pay | Admitting: Family Medicine

## 2016-02-15 VITALS — BP 126/80 | HR 84 | Temp 98.6°F | Resp 16 | Ht 65.0 in | Wt 206.0 lb

## 2016-02-15 DIAGNOSIS — R300 Dysuria: Secondary | ICD-10-CM

## 2016-02-15 DIAGNOSIS — Z23 Encounter for immunization: Secondary | ICD-10-CM

## 2016-02-15 NOTE — Progress Notes (Signed)
[name], is a [age] y.o. female  [id number]  [id number]  DOB - 09/25/1975  CC:  Chief Complaint  Patient presents with  . Pelvic Pain    pelvic pain x 1 week   . Nausea       HPI: Sheila AlexandersVickie Slabach is a 40 y.o. female presents for lower abd pain, frequency and dysuria for about a week.  She has a history of frequent UTIS and has an appointment with urology later this month. She  denies fever, chills, flank pain. Hematuria. Her urinalysis shows moderate blood, nitrates negative and leucocytes large. Her symptoms are not worsening, have been stable during the week. Allergies  Allergen Reactions  . Other     Seasonal allergies    Past Medical History:  Diagnosis Date  . Anemia   . Constipation   . GERD (gastroesophageal reflux disease)   . Hx of endometriosis   . Macromastia 07/2011  . Migraine    Current Outpatient Prescriptions on File Prior to Visit  Medication Sig Dispense Refill  . acetaminophen (TYLENOL) 500 MG tablet Take 1,000 mg by mouth every 6 (six) hours as needed for mild pain (pain).     [name]. docusate sodium (COLACE) 100 MG capsule Take 100 mg by mouth 2 (two) times daily as needed for moderate constipation.     [name]. esomeprazole (NEXIUM) 40 MG capsule Take 1 capsule (40 mg total) by mouth every morning. 90 capsule 1  . ferrous sulfate 325 (65 FE) MG tablet Take 1 tablet (325 mg total) by mouth every other day. 30 tablet 3  . ibuprofen (ADVIL,MOTRIN) 200 MG tablet Take 200-600 mg by mouth every 6 (six) hours as needed for moderate pain.     [name]. loratadine (CLARITIN) 10 MG tablet Take 1 tablet (10 mg total) by mouth every other day. 30 tablet 3  . polyethylene glycol (MIRALAX / GLYCOLAX) packet Take 17 g by mouth daily as needed for moderate constipation.    . valACYclovir (VALTREX) 500 MG tablet Take 1 tablet (500 mg total) by mouth 2 (two) times daily as needed (outbreaks). 30 tablet 1  . amitriptyline (ELAVIL) 25 MG tablet Take 1 tablet (25 mg total) by mouth  at bedtime. (Patient not taking: Reported on 02/15/2016) 30 tablet 6  . cephALEXin (KEFLEX) 500 MG capsule Take 1 capsule (500 mg total) by mouth 4 (four) times daily. (Patient not taking: Reported on 02/15/2016) 28 capsule 0  . EPINEPHrine (EPIPEN 2-PAK) 0.3 mg/0.3 mL DEVI Inject 0.3 mLs (0.3 mg total) into the muscle once as needed (for severe allergic reaction). CAll 911 immediately if you have to use this medicine (Patient not taking: Reported on 02/15/2016) 1 Device 1  . ipratropium (ATROVENT) 0.06 % nasal spray Place 2 sprays into both nostrils 4 (four) times daily. (Patient not taking: Reported on 02/15/2016) 15 mL 1   No current facility-administered medications on file prior to visit.    Family History  Problem Relation Age of Onset  . Diabetes Mother   . Hypertension Mother   . Thyroid disease Sister    Social History   Social History  . Marital status: Divorced    Spouse name: N/A  . Number of children: 2  . Years of education: 12th   Occupational History  .  Other    [employer]   Social History Main Topics  . Smoking status: Never Smoker  . Smokeless tobacco: Never Used  . Alcohol use No  . Drug use: No  .  Sexual activity: Not on file   Other Topics Concern  . Not on file   Social History Narrative   Patient lives at home with family.   Caffeine Use: 1 cup daily    Review of Systems: Constitutional: Negative Skin: Negative HENT: Negative  Eyes: Negative  Neck: Negative Respiratory: Negative Cardiovascular: Negative Gastrointestinal: Negative Genitourinary: + for dysuria, cloudy urine. Musculoskeletal: Negative   Neurological: Negative for Hematological: Negative  Psychiatric/Behavioral: Negative    Objective:   Vitals:   02/15/16 0844  BP: 126/80  Pulse: 84  Resp: 16  Temp: 98.6 F (37 C)    Physical Exam: Constitutional: Patient appears well-developed and well-nourished. No distress. HENT: Normocephalic, atraumatic, External right and  left ear normal. Oropharynx is clear and moist.  Eyes: Conjunctivae and EOM are normal. PERRLA, no scleral icterus. Neck: Normal ROM. Neck supple. No lymphadenopathy, No thyromegaly. CVS: RRR, S1/S2 +, no murmurs, no gallops, no rubs Pulmonary: Effort and breath sounds normal, no stridor, rhonchi, wheezes, rales.  Abdominal: Soft. Normoactive BS,, no distension, rebound or guarding. + for mild suprapubic tenderness Musculoskeletal: Normal range of motion. No edema and no tenderness.  Neuro: Alert.Normal muscle tone coordination. Non-focal Skin: Skin is warm and dry. No rash noted. Not diaphoretic. No erythema. No pallor. Psychiatric: Normal mood and affect. Behavior, judgment, thought content normal.  Lab Results  Component Value Date   WBC 8.8 11/01/2015   HGB 12.9 11/01/2015   HCT 38.0 11/01/2015   MCV 72.6 (L) 11/01/2015   PLT 362 11/01/2015   Lab Results  Component Value Date   CREATININE 0.70 11/01/2015   BUN <3 (L) 11/01/2015   NA 140 11/01/2015   K 3.7 11/01/2015   CL 102 11/01/2015   CO2 30 06/26/2014    No results found for: HGBA1C Lipid Panel     Component Value Date/Time   CHOL 177 12/06/2015 1153   TRIG 64 12/06/2015 1153   HDL 55 12/06/2015 1153   CHOLHDL 3.2 12/06/2015 1153   VLDL 13 12/06/2015 1153   LDLCALC 109 12/06/2015 1153       Assessment and plan:   1. Dysuria -urinalysis - Urine culture -Will prescribe antibiotic based on culture results.  2. Encounter for immunization  - Flu Vaccine QUAD 36+ mos IM   Return if symptoms worsen or fail to improve.  The patient was given clear instructions to go to ER or return to medical center if symptoms don't improve, worsen or new problems develop. The patient verbalized understanding.    Henrietta Hoover FNP  02/15/2016, 11:56 AM

## 2016-02-15 NOTE — Patient Instructions (Signed)
Drink lots of liquids. Will call with culture results.

## 2016-02-16 LAB — POCT URINALYSIS DIP (DEVICE)
Bilirubin Urine: NEGATIVE
Glucose, UA: NEGATIVE mg/dL
Ketones, ur: NEGATIVE mg/dL
Nitrite: NEGATIVE
Protein, ur: 30 mg/dL — AB
Specific Gravity, Urine: 1.01 (ref 1.005–1.030)
Urobilinogen, UA: 0.2 mg/dL (ref 0.0–1.0)
pH: 6 (ref 5.0–8.0)

## 2016-02-17 LAB — URINE CULTURE

## 2016-02-19 ENCOUNTER — Encounter (HOSPITAL_COMMUNITY): Payer: Self-pay

## 2016-02-19 ENCOUNTER — Telehealth: Payer: Self-pay

## 2016-02-19 ENCOUNTER — Other Ambulatory Visit: Payer: Self-pay | Admitting: Family Medicine

## 2016-02-19 ENCOUNTER — Emergency Department (HOSPITAL_COMMUNITY)
Admission: EM | Admit: 2016-02-19 | Discharge: 2016-02-19 | Disposition: A | Discharge status: Home / Self Care | Payer: No Typology Code available for payment source | Attending: Emergency Medicine | Admitting: Emergency Medicine

## 2016-02-19 DIAGNOSIS — N10 Acute tubulo-interstitial nephritis: Secondary | ICD-10-CM

## 2016-02-19 DIAGNOSIS — Z79899 Other long term (current) drug therapy: Secondary | ICD-10-CM | POA: Insufficient documentation

## 2016-02-19 LAB — CBC WITH DIFFERENTIAL/PLATELET
Basophils Absolute: 0 10*3/uL (ref 0.0–0.1)
Basophils Relative: 0 %
Eosinophils Absolute: 0 10*3/uL (ref 0.0–0.7)
Eosinophils Relative: 0 %
HCT: 34.2 % — ABNORMAL LOW (ref 36.0–46.0)
Hemoglobin: 11.4 g/dL — ABNORMAL LOW (ref 12.0–15.0)
Lymphocytes Relative: 27 %
Lymphs Abs: 2.7 10*3/uL (ref 0.7–4.0)
MCH: 23.8 pg — ABNORMAL LOW (ref 26.0–34.0)
MCHC: 33.3 g/dL (ref 30.0–36.0)
MCV: 71.4 fL — ABNORMAL LOW (ref 78.0–100.0)
Monocytes Absolute: 0.5 10*3/uL (ref 0.1–1.0)
Monocytes Relative: 5 %
Neutro Abs: 6.8 10*3/uL (ref 1.7–7.7)
Neutrophils Relative %: 68 %
Platelets: 355 10*3/uL (ref 150–400)
RBC: 4.79 MIL/uL (ref 3.87–5.11)
RDW: 15.1 % (ref 11.5–15.5)
WBC: 10.1 10*3/uL (ref 4.0–10.5)

## 2016-02-19 LAB — BASIC METABOLIC PANEL
Anion gap: 8 (ref 5–15)
BUN: <5 mg/dL — ABNORMAL LOW (ref 6–20)
CO2: 28 mmol/L (ref 22–32)
Calcium: 9.2 mg/dL (ref 8.9–10.3)
Chloride: 103 mmol/L (ref 101–111)
Creatinine, Ser: 0.82 mg/dL (ref 0.44–1.00)
GFR calc Af Amer: >60 mL/min (ref >60)
GFR calc non Af Amer: >60 mL/min (ref >60)
Glucose, Bld: 80 mg/dL (ref 65–99)
Potassium: 3.8 mmol/L (ref 3.5–5.1)
Sodium: 139 mmol/L (ref 135–145)

## 2016-02-19 LAB — URINALYSIS, ROUTINE W REFLEX MICROSCOPIC
Bilirubin Urine: NEGATIVE
Glucose, UA: NEGATIVE mg/dL
Ketones, ur: NEGATIVE mg/dL
Nitrite: NEGATIVE
Protein, ur: NEGATIVE mg/dL
Specific Gravity, Urine: 1.003 — ABNORMAL LOW (ref 1.005–1.030)
pH: 6.5 (ref 5.0–8.0)

## 2016-02-19 LAB — URINE MICROSCOPIC-ADD ON

## 2016-02-19 MED ORDER — ONDANSETRON 4 MG PO TBDP: ORAL_TABLET | ORAL | 0 refills | Status: DC

## 2016-02-19 MED ORDER — DEXTROSE 5 % IV SOLN
1.0000 g | Freq: Once | INTRAVENOUS | Status: AC
  Administered 2016-02-19: 1 g via INTRAVENOUS
  Filled 2016-02-19: qty 10

## 2016-02-19 MED ORDER — ONDANSETRON HCL 4 MG/2ML IJ SOLN
4.0000 mg | Freq: Once | INTRAMUSCULAR | Status: AC
Start: 2016-02-19 — End: 2016-02-19
  Administered 2016-02-19: 4 mg via INTRAVENOUS
  Filled 2016-02-19: qty 2

## 2016-02-19 MED ORDER — SULFAMETHOXAZOLE-TRIMETHOPRIM 800-160 MG PO TABS: 1.0000 | ORAL_TABLET | Freq: Two times a day (BID) | ORAL | 0 refills | Status: DC

## 2016-02-19 MED ORDER — HYDROMORPHONE HCL 1 MG/ML IJ SOLN
1.0000 mg | Freq: Once | INTRAMUSCULAR | Status: AC
  Administered 2016-02-19: 1 mg via INTRAVENOUS
  Filled 2016-02-19: qty 1

## 2016-02-19 MED ORDER — HYDROCODONE-ACETAMINOPHEN 5-325 MG PO TABS: 1.0000 | ORAL_TABLET | Freq: Four times a day (QID) | ORAL | 0 refills | Status: DC | PRN

## 2016-02-19 MED ORDER — CEPHALEXIN 500 MG PO CAPS: 500.0000 mg | ORAL_CAPSULE | Freq: Four times a day (QID) | ORAL | 0 refills | Status: DC

## 2016-02-19 MED FILL — SULFAMETHOXAZOLE-TMP DS TAB: 800-160 | 7 days supply | Qty: 14 | Fill #0

## 2016-02-19 MED FILL — ONDANSETRON ODT 4 MG TABLET: 4 | 1 days supply | Qty: 4 | Fill #0

## 2016-02-19 NOTE — Telephone Encounter (Signed)
Called and spoke with patient, advised of Urinary Tract infection and need to start Bactrim twice daily as directed. Patient verbalized understanding. Thanks!

## 2016-02-19 NOTE — ED Provider Notes (Signed)
[facility]-EMERGENCY DEPT Provider Note   CSN: [id number] Arrival date & time: 02/19/16  1010     History   Chief Complaint Chief Complaint  Patient presents with  . Flank Pain    HPI [name] is a 40 y.o. female.  Patient complains of right flank pain. Patient has a history of urinary tract infections.   The history is provided by the patient. No language interpreter was used.  Flank Pain  This is a recurrent problem. The current episode started more than 2 days ago. The problem occurs constantly. The problem has not changed since onset.Pertinent negatives include no chest pain, no abdominal pain and no headaches. Nothing aggravates the symptoms. Nothing relieves the symptoms. She has tried nothing for the symptoms.    Past Medical History:  Diagnosis Date  . Anemia   . Constipation   . GERD (gastroesophageal reflux disease)   . Hx of endometriosis   . Macromastia 07/2011  . Migraine     Patient Active Problem List   Diagnosis Date Noted  . Disturbance of skin sensation 04/27/2014  . Dizziness and giddiness 03/14/2014  . Headache, migraine 03/14/2014  . Myalgia and myositis, unspecified 06/07/2013  . CAP (community acquired pneumonia) 08/22/2012  . Nausea and vomiting in adult 08/22/2012  . Viral syndrome 08/22/2012  . Pelvic pain in female 12/18/2010  . PID (acute pelvic inflammatory disease) 12/18/2010  . ANEMIA-IRON DEFICIENCY 10/15/2006  . HERPES, GENITAL NOS 09/30/2006  . OBESITY NOS 09/30/2006  . DISORDER, NONORGANIC SLEEP NOS 09/30/2006  . CONSTIPATION NOS 09/30/2006  . NEURITIS, LUMBOSACRAL NOS 09/30/2006  . HX, PERSONAL, MENTAL DISORDER NOS 09/30/2006    Past Surgical History:  Procedure Laterality Date  . ABDOMINAL HYSTERECTOMY  12/12/2003  . ADENOIDECTOMY  05/2011  . ANKLE ARTHROSCOPY  12/29/2007   right; with extensive debridement  . BLADDER NECK RECONSTRUCTION  01/14/2011   Procedure: BLADDER NECK REPAIR;  Surgeon: Reva Bores, MD;   Location: [facility];  Service: Gynecology;  Laterality: N/A;  Laparoscopic Repair of Incidental Cystotomy  . BREAST REDUCTION SURGERY  08/05/2011   Procedure: MAMMARY REDUCTION  (BREAST);  Surgeon: Louisa Second, MD;  Location: [facility];  Service: Plastics;  Laterality: Bilateral;  bilateral  . CESAREAN SECTION  12/29/2000; 1994  . DILATION AND CURETTAGE OF UTERUS  07/08/2003   open laparoscopy  . LAPAROSCOPIC SALPINGOOPHERECTOMY  01/14/2011   left  . REPAIR PERONEAL TENDONS ANKLE  01/03/2010   repair right subluxing peroneal tendons  . RIGHT OOPHORECTOMY     with lysis of adhesions  . SALPINGECTOMY  12/12/2003   right; with TAH  . TONSILLECTOMY  as a child  . TUBAL LIGATION  12/29/2000    OB History    Gravida Para Term Preterm AB Living   2 2 2     2    SAB TAB Ectopic Multiple Live Births                   Home Medications    Prior to Admission medications   Medication Sig Start Date End Date Taking? Authorizing Provider  acetaminophen (TYLENOL) 500 MG tablet Take 1,000 mg by mouth every 6 (six) hours as needed for mild pain (pain).    Yes Historical Provider, MD  docusate sodium (COLACE) 100 MG capsule Take 100 mg by mouth 2 (two) times daily as needed for moderate constipation.    Yes Historical Provider, MD  esomeprazole (NEXIUM) 40 MG capsule Take 1 capsule (40 mg  total) by mouth every morning. 12/06/15  Yes Henrietta Hoover, NP  ibuprofen (ADVIL,MOTRIN) 200 MG tablet Take 200-600 mg by mouth every 6 (six) hours as needed for moderate pain.    Yes Historical Provider, MD  polyethylene glycol (MIRALAX / GLYCOLAX) packet Take 17 g by mouth daily as needed for moderate constipation.   Yes Historical Provider, MD  valACYclovir (VALTREX) 500 MG tablet Take 1 tablet (500 mg total) by mouth 2 (two) times daily as needed (outbreaks). 12/06/15  Yes Henrietta Hoover, NP  amitriptyline (ELAVIL) 25 MG tablet Take 1 tablet (25 mg total) by mouth at bedtime. Patient not taking:  Reported on 02/19/2016 05/31/14   Suanne Marker, MD  cephALEXin (KEFLEX) 500 MG capsule Take 1 capsule (500 mg total) by mouth 4 (four) times daily. 02/19/16   Bethann Berkshire, MD  EPINEPHrine (EPIPEN 2-PAK) 0.3 mg/0.3 mL DEVI Inject 0.3 mLs (0.3 mg total) into the muscle once as needed (for severe allergic reaction). CAll 911 immediately if you have to use this medicine Patient not taking: Reported on 02/19/2016 09/09/12   Francee Piccolo, PA-C  ferrous sulfate 325 (65 FE) MG tablet Take 1 tablet (325 mg total) by mouth every other day. Patient not taking: Reported on 02/19/2016 12/06/15   Henrietta Hoover, NP  HYDROcodone-acetaminophen (NORCO/VICODIN) 5-325 MG tablet Take 1 tablet by mouth every 6 (six) hours as needed for moderate pain. 02/19/16   Bethann Berkshire, MD  ipratropium (ATROVENT) 0.06 % nasal spray Place 2 sprays into both nostrils 4 (four) times daily. Patient not taking: Reported on 02/19/2016 07/24/15   Linna Hoff, MD  loratadine (CLARITIN) 10 MG tablet Take 1 tablet (10 mg total) by mouth every other day. Patient not taking: Reported on 02/19/2016 12/06/15   Henrietta Hoover, NP  ondansetron (ZOFRAN ODT) 4 MG disintegrating tablet 4mg  ODT q4 hours prn nausea/vomit 02/19/16   Bethann Berkshire, MD  sulfamethoxazole-trimethoprim (BACTRIM DS,SEPTRA DS) 800-160 MG tablet Take 1 tablet by mouth 2 (two) times daily. Patient not taking: Reported on 02/19/2016 02/19/16   Henrietta Hoover, NP    Family History Family History  Problem Relation Age of Onset  . Diabetes Mother   . Hypertension Mother   . Thyroid disease Sister     Social History Social History  Substance Use Topics  . Smoking status: Never Smoker  . Smokeless tobacco: Never Used  . Alcohol use No     Allergies   Other   Review of Systems Review of Systems  Constitutional: Negative for appetite change and fatigue.  HENT: Negative for congestion, ear discharge and sinus pressure.   Eyes: Negative for  discharge.  Respiratory: Negative for cough.   Cardiovascular: Negative for chest pain.  Gastrointestinal: Negative for abdominal pain and diarrhea.  Genitourinary: Positive for flank pain and frequency. Negative for hematuria.  Musculoskeletal: Negative for back pain.  Skin: Negative for rash.  Neurological: Negative for seizures and headaches.  Psychiatric/Behavioral: Negative for hallucinations.     Physical Exam Updated Vital Signs BP 136/97   Pulse 80   Temp 97.7 F (36.5 C) (Oral)   Resp 20   LMP 12/27/2003   SpO2 100%   Physical Exam  Constitutional: She is oriented to person, place, and time. She appears well-developed.  HENT:  Head: Normocephalic.  Eyes: Conjunctivae and EOM are normal. No scleral icterus.  Neck: Neck supple. No thyromegaly present.  Cardiovascular: Normal rate and regular rhythm.  Exam reveals no gallop and no friction  rub.   No murmur heard. Pulmonary/Chest: No stridor. She has no wheezes. She has no rales. She exhibits no tenderness.  Abdominal: She exhibits no distension. There is no tenderness. There is no rebound.  Genitourinary:  Genitourinary Comments: Tender right flank  Musculoskeletal: Normal range of motion. She exhibits no edema.  Lymphadenopathy:    She has no cervical adenopathy.  Neurological: She is oriented to person, place, and time. She exhibits normal muscle tone. Coordination normal.  Skin: No rash noted. No erythema.  Psychiatric: She has a normal mood and affect. Her behavior is normal.     ED Treatments / Results  Labs (all labs ordered are listed, but only abnormal results are displayed) Labs Reviewed  URINALYSIS, ROUTINE W REFLEX MICROSCOPIC (NOT AT [facility]) - Abnormal; Notable for the following:       Result Value   APPearance CLOUDY (*)    Specific Gravity, Urine 1.003 (*)    Hgb urine dipstick SMALL (*)    Leukocytes, UA LARGE (*)    All other components within normal limits  URINE MICROSCOPIC-ADD ON -  Abnormal; Notable for the following:    Squamous Epithelial / LPF 0-5 (*)    Bacteria, UA MANY (*)    All other components within normal limits  CBC WITH DIFFERENTIAL/PLATELET - Abnormal; Notable for the following:    Hemoglobin 11.4 (*)    HCT 34.2 (*)    MCV 71.4 (*)    MCH 23.8 (*)    All other components within normal limits  BASIC METABOLIC PANEL - Abnormal; Notable for the following:    BUN <5 (*)    All other components within normal limits  URINE CULTURE    EKG  EKG Interpretation None       Radiology No results found.  Procedures Procedures (including critical care time)  Medications Ordered in ED Medications  cefTRIAXone (ROCEPHIN) 1 g in dextrose 5 % 50 mL IVPB (1 g Intravenous New Bag/Given 02/19/16 1345)  HYDROmorphone (DILAUDID) injection 1 mg (1 mg Intravenous Given 02/19/16 1353)  ondansetron (ZOFRAN) injection 4 mg (4 mg Intravenous Given 02/19/16 1353)     Initial Impression / Assessment and Plan / ED Course  I have reviewed the triage vital signs and the nursing notes.  Pertinent labs & imaging results that were available during my care of the patient were reviewed by me and considered in my medical decision making (see chart for details).  Clinical Course    Patient with urinary tract infection and flank pain. Patient will be started on Keflex and Vicodin and Zofran and will follow-up with family doctor later this week  Final Clinical Impressions(s) / ED Diagnoses   Final diagnoses:  Acute pyelonephritis    New Prescriptions New Prescriptions   CEPHALEXIN (KEFLEX) 500 MG CAPSULE    Take 1 capsule (500 mg total) by mouth 4 (four) times daily.   HYDROCODONE-ACETAMINOPHEN (NORCO/VICODIN) 5-325 MG TABLET    Take 1 tablet by mouth every 6 (six) hours as needed for moderate pain.   ONDANSETRON (ZOFRAN ODT) 4 MG DISINTEGRATING TABLET    4mg  ODT q4 hours prn nausea/vomit     Bethann BerkshireJoseph Kiela Shisler, MD 02/19/16 1442

## 2016-02-19 NOTE — ED Triage Notes (Signed)
Pt with rt flank pain. Odor to urine.  Pain x 2 weeks. Denies urinary pain.  Frequent UTI.  Nausea

## 2016-02-19 NOTE — Discharge Instructions (Signed)
Follow up with your md later this week. °

## 2016-02-19 NOTE — Telephone Encounter (Signed)
-----   Message from Henrietta HooverLinda C Bernhardt, NP sent at 02/19/2016  8:11 AM EDT ----- Bactrim sent for UTI

## 2016-02-21 LAB — URINE CULTURE: Culture: >=100000 — AB

## 2016-02-22 ENCOUNTER — Telehealth (HOSPITAL_BASED_OUTPATIENT_CLINIC_OR_DEPARTMENT_OTHER): Payer: Self-pay | Admitting: Emergency Medicine

## 2016-02-22 NOTE — Telephone Encounter (Signed)
Post ED Visit - Positive Culture Follow-up  Culture report reviewed by antimicrobial stewardship pharmacist:  []  Enzo BiNathan Batchelder, Pharm.D. []  Celedonio MiyamotoJeremy Frens, Pharm.D., BCPS []  Garvin FilaMike Maccia, Pharm.D. []  Georgina PillionElizabeth Martin, [address] BCPS []  EllistonMinh Pham, [address]Ph[address]D., BCPS, AAHIVP []  Estella HuskMichelle Turner, Pharm.D., BCPS, AAHIVP []  Tennis Mustassie Stewart, Pharm.D. []  Rob Oswaldo DoneVincent, [address] Mackie Paienee Ackley PharmD  Positive urine culture Treated with cephalexin, organism sensitive to the same and no further patient follow-up is required at this time.  Berle MullMiller, Marvis Saefong 02/22/2016, 10:10 AM

## 2016-03-18 ENCOUNTER — Emergency Department (HOSPITAL_COMMUNITY)
Admission: EM | Admit: 2016-03-18 | Discharge: 2016-03-18 | Disposition: A | Discharge status: Home / Self Care | Payer: No Typology Code available for payment source | Attending: Emergency Medicine | Admitting: Emergency Medicine

## 2016-03-18 ENCOUNTER — Encounter (HOSPITAL_COMMUNITY): Payer: Self-pay | Admitting: Emergency Medicine

## 2016-03-18 DIAGNOSIS — Z79899 Other long term (current) drug therapy: Secondary | ICD-10-CM | POA: Insufficient documentation

## 2016-03-18 DIAGNOSIS — R112 Nausea with vomiting, unspecified: Secondary | ICD-10-CM

## 2016-03-18 LAB — URINE MICROSCOPIC-ADD ON
RBC / HPF: NONE SEEN RBC/hpf (ref 0–5)
Squamous Epithelial / LPF: NONE SEEN

## 2016-03-18 LAB — COMPREHENSIVE METABOLIC PANEL
ALT: 19 U/L (ref 14–54)
AST: 31 U/L (ref 15–41)
Albumin: 4.2 g/dL (ref 3.5–5.0)
Alkaline Phosphatase: 67 U/L (ref 38–126)
Anion gap: 6 (ref 5–15)
BUN: 6 mg/dL (ref 6–20)
CO2: 27 mmol/L (ref 22–32)
Calcium: 8.9 mg/dL (ref 8.9–10.3)
Chloride: 102 mmol/L (ref 101–111)
Creatinine, Ser: 0.76 mg/dL (ref 0.44–1.00)
GFR calc Af Amer: >60 mL/min (ref >60)
GFR calc non Af Amer: >60 mL/min (ref >60)
Glucose, Bld: 99 mg/dL (ref 65–99)
Potassium: 3.7 mmol/L (ref 3.5–5.1)
Sodium: 135 mmol/L (ref 135–145)
Total Bilirubin: 0.6 mg/dL (ref 0.3–1.2)
Total Protein: 8.2 g/dL — ABNORMAL HIGH (ref 6.5–8.1)

## 2016-03-18 LAB — URINALYSIS, ROUTINE W REFLEX MICROSCOPIC
Bilirubin Urine: NEGATIVE
Glucose, UA: NEGATIVE mg/dL
Hgb urine dipstick: NEGATIVE
Ketones, ur: NEGATIVE mg/dL
Leukocytes, UA: NEGATIVE
Nitrite: POSITIVE — AB
Protein, ur: NEGATIVE mg/dL
Specific Gravity, Urine: 1.012 (ref 1.005–1.030)
pH: 7.5 (ref 5.0–8.0)

## 2016-03-18 LAB — CBC
HCT: 32.4 % — ABNORMAL LOW (ref 36.0–46.0)
Hemoglobin: 10.3 g/dL — ABNORMAL LOW (ref 12.0–15.0)
MCH: 23.6 pg — ABNORMAL LOW (ref 26.0–34.0)
MCHC: 31.8 g/dL (ref 30.0–36.0)
MCV: 74.3 fL — ABNORMAL LOW (ref 78.0–100.0)
Platelets: 359 10*3/uL (ref 150–400)
RBC: 4.36 MIL/uL (ref 3.87–5.11)
RDW: 15.6 % — ABNORMAL HIGH (ref 11.5–15.5)
WBC: 7.2 10*3/uL (ref 4.0–10.5)

## 2016-03-18 LAB — I-STAT BETA HCG BLOOD, ED (MC, WL, AP ONLY): I-stat hCG, quantitative: <5 m[IU]/mL (ref <5)

## 2016-03-18 LAB — LIPASE, BLOOD: Lipase: 19 U/L (ref 11–51)

## 2016-03-18 MED ORDER — PROMETHAZINE HCL 25 MG RE SUPP: 25.0000 mg | Freq: Four times a day (QID) | RECTAL | 0 refills | Status: DC | PRN

## 2016-03-18 MED ORDER — PROMETHAZINE HCL 25 MG PO TABS: 25.0000 mg | ORAL_TABLET | Freq: Four times a day (QID) | ORAL | 0 refills | Status: DC | PRN

## 2016-03-18 MED ORDER — ONDANSETRON 4 MG PO TBDP
4.0000 mg | ORAL_TABLET | Freq: Once | ORAL | Status: AC | PRN
  Administered 2016-03-18: 4 mg via ORAL
  Filled 2016-03-18: qty 1

## 2016-03-18 MED ORDER — ONDANSETRON HCL 4 MG/2ML IJ SOLN
4.0000 mg | Freq: Once | INTRAMUSCULAR | Status: AC
  Administered 2016-03-18: 4 mg via INTRAVENOUS
  Filled 2016-03-18: qty 2

## 2016-03-18 MED ORDER — FAMOTIDINE 20 MG PO TABS: 20.0000 mg | ORAL_TABLET | Freq: Two times a day (BID) | ORAL | 0 refills | Status: DC

## 2016-03-18 MED ORDER — KETOROLAC TROMETHAMINE 30 MG/ML IJ SOLN
30.0000 mg | Freq: Once | INTRAMUSCULAR | Status: AC
  Administered 2016-03-18: 30 mg via INTRAVENOUS
  Filled 2016-03-18: qty 1

## 2016-03-18 MED ORDER — SODIUM CHLORIDE 0.9 % IV BOLUS (SEPSIS)
1000.0000 mL | Freq: Once | INTRAVENOUS | Status: AC
  Administered 2016-03-18: 1000 mL via INTRAVENOUS

## 2016-03-18 MED ORDER — GI COCKTAIL ~~LOC~~
30.0000 mL | Freq: Once | ORAL | Status: AC
  Administered 2016-03-18: 30 mL via ORAL
  Filled 2016-03-18: qty 30

## 2016-03-18 NOTE — ED Provider Notes (Signed)
[facility]-EMERGENCY DEPT Provider Note   CSN: [id number] Arrival date & time: 03/18/16  1711  By signing my name below, I, [name], attest that this documentation has been prepared under the direction and in the presence of  Sheila Reign Dziuba PA-C. Electronically Signed: Christy [name], ED Scribe. 03/18/16. 9:21 PM.  History   Chief Complaint Chief Complaint  Patient presents with  . Emesis   The history is provided by the patient and medical records. No language interpreter was used.     HPI Comments:  [name] is a 40 y.o. female who presents to the Emergency Department complaining of persistent vomiting onset yesterday around lunch time.  Pt reports that she was trying to eat and began feeling nauseous and threw up.  She tried to drinking water and threw that up as well.  Pt has not been able to keep much down and reports she is throwing up her medications.  She notes associated abdominal pain, headache weakness and fatigue.  She reports her abdominal pain begins in her upper abdomen and radiates to her right flank.  Pt has had 2 C-sections, an ovarian surgery, and a cholecystectomy.  Pt states this feels different from her past episodes of intestinal inflammation. She adds that she has had frequent kidney infection recently.  She reports a new scaly rash on the left side of her abdomen unchanged since onset 1 week ago.  She tried tylenol without relief.  She drinks EtOH occasionally.  She last drank alcohol just over a week ago.  She denies diarrhea, dysuria, hematuria, and taking Asprin or goody powder.    Past Medical History:  Diagnosis Date  . Anemia   . Constipation   . GERD (gastroesophageal reflux disease)   . Hx of endometriosis   . Macromastia 07/2011  . Migraine     Patient Active Problem List   Diagnosis Date Noted  . Disturbance of skin sensation 04/27/2014  . Dizziness and giddiness 03/14/2014  . Headache, migraine 03/14/2014  . Myalgia and myositis,  unspecified 06/07/2013  . CAP (community acquired pneumonia) 08/22/2012  . Nausea and vomiting in adult 08/22/2012  . Viral syndrome 08/22/2012  . Pelvic pain in female 12/18/2010  . PID (acute pelvic inflammatory disease) 12/18/2010  . ANEMIA-IRON DEFICIENCY 10/15/2006  . HERPES, GENITAL NOS 09/30/2006  . OBESITY NOS 09/30/2006  . DISORDER, NONORGANIC SLEEP NOS 09/30/2006  . CONSTIPATION NOS 09/30/2006  . NEURITIS, LUMBOSACRAL NOS 09/30/2006  . HX, PERSONAL, MENTAL DISORDER NOS 09/30/2006    Past Surgical History:  Procedure Laterality Date  . ABDOMINAL HYSTERECTOMY  12/12/2003  . ADENOIDECTOMY  05/2011  . ANKLE ARTHROSCOPY  12/29/2007   right; with extensive debridement  . BLADDER NECK RECONSTRUCTION  01/14/2011   Procedure: BLADDER NECK REPAIR;  Surgeon: Reva Bores, MD;  Location: [facility];  Service: Gynecology;  Laterality: N/A;  Laparoscopic Repair of Incidental Cystotomy  . BREAST REDUCTION SURGERY  08/05/2011   Procedure: MAMMARY REDUCTION  (BREAST);  Surgeon: Louisa Second, MD;  Location: [facility];  Service: Plastics;  Laterality: Bilateral;  bilateral  . CESAREAN SECTION  12/29/2000; 1994  . DILATION AND CURETTAGE OF UTERUS  07/08/2003   open laparoscopy  . LAPAROSCOPIC SALPINGOOPHERECTOMY  01/14/2011   left  . REPAIR PERONEAL TENDONS ANKLE  01/03/2010   repair right subluxing peroneal tendons  . RIGHT OOPHORECTOMY     with lysis of adhesions  . SALPINGECTOMY  12/12/2003   right; with TAH  . TONSILLECTOMY  as  a child  . TUBAL LIGATION  12/29/2000    OB History    Gravida Para Term Preterm AB Living   2 2 2     2    SAB TAB Ectopic Multiple Live Births                   Home Medications    Prior to Admission medications   Medication Sig Start Date End Date Taking? Authorizing Provider  acetaminophen (TYLENOL) 500 MG tablet Take 1,000 mg by mouth every 6 (six) hours as needed for mild pain (pain).    Yes Historical Provider, MD  docusate sodium  (COLACE) 100 MG capsule Take 100 mg by mouth 2 (two) times daily as needed for moderate constipation.    Yes Historical Provider, MD  esomeprazole (NEXIUM) 40 MG capsule Take 1 capsule (40 mg total) by mouth every morning. 12/06/15  Yes Henrietta HooverLinda C Bernhardt, NP  polyethylene glycol (MIRALAX / GLYCOLAX) packet Take 17 g by mouth daily as needed for moderate constipation.   Yes Historical Provider, MD  amitriptyline (ELAVIL) 25 MG tablet Take 1 tablet (25 mg total) by mouth at bedtime. Patient not taking: Reported on 03/18/2016 05/31/14   Suanne MarkerVikram R Penumalli, MD  cephALEXin (KEFLEX) 500 MG capsule Take 1 capsule (500 mg total) by mouth 4 (four) times daily. Patient not taking: Reported on 03/18/2016 02/19/16   Bethann BerkshireJoseph Zammit, MD  EPINEPHrine (EPIPEN 2-PAK) 0.3 mg/0.3 mL DEVI Inject 0.3 mLs (0.3 mg total) into the muscle once as needed (for severe allergic reaction). CAll 911 immediately if you have to use this medicine Patient not taking: Reported on 03/18/2016 09/09/12   Francee PiccoloJennifer Piepenbrink, PA-C  ferrous sulfate 325 (65 FE) MG tablet Take 1 tablet (325 mg total) by mouth every other day. Patient not taking: Reported on 03/18/2016 12/06/15   Henrietta HooverLinda C Bernhardt, NP  HYDROcodone-acetaminophen (NORCO/VICODIN) 5-325 MG tablet Take 1 tablet by mouth every 6 (six) hours as needed for moderate pain. Patient not taking: Reported on 03/18/2016 02/19/16   Bethann BerkshireJoseph Zammit, MD  ibuprofen (ADVIL,MOTRIN) 200 MG tablet Take 200-600 mg by mouth every 6 (six) hours as needed for moderate pain.     Historical Provider, MD  ipratropium (ATROVENT) 0.06 % nasal spray Place 2 sprays into both nostrils 4 (four) times daily. Patient not taking: Reported on 03/18/2016 07/24/15   Linna HoffJames D Kindl, MD  loratadine (CLARITIN) 10 MG tablet Take 1 tablet (10 mg total) by mouth every other day. Patient not taking: Reported on 03/18/2016 12/06/15   Henrietta HooverLinda C Bernhardt, NP  ondansetron (ZOFRAN ODT) 4 MG disintegrating tablet 4mg  ODT q4 hours prn  nausea/vomit Patient not taking: Reported on 03/18/2016 02/19/16   Bethann BerkshireJoseph Zammit, MD  sulfamethoxazole-trimethoprim (BACTRIM DS,SEPTRA DS) 800-160 MG tablet Take 1 tablet by mouth 2 (two) times daily. Patient not taking: Reported on 03/18/2016 02/19/16   Henrietta HooverLinda C Bernhardt, NP  valACYclovir (VALTREX) 500 MG tablet Take 1 tablet (500 mg total) by mouth 2 (two) times daily as needed (outbreaks). 12/06/15   Henrietta HooverLinda C Bernhardt, NP    Family History Family History  Problem Relation Age of Onset  . Diabetes Mother   . Hypertension Mother   . Thyroid disease Sister     Social History Social History  Substance Use Topics  . Smoking status: Never Smoker  . Smokeless tobacco: Never Used  . Alcohol use No     Allergies   Other   Review of Systems Review of Systems  Constitutional: Positive for  fatigue. Negative for fever.  HENT: Negative for rhinorrhea and sore throat.   Eyes: Negative for redness.  Respiratory: Negative for cough.   Cardiovascular: Negative for chest pain.  Gastrointestinal: Positive for abdominal pain, nausea and vomiting. Negative for diarrhea.  Genitourinary: Negative for dysuria and hematuria.  Musculoskeletal: Negative for myalgias.  Skin: Positive for rash.  Neurological: Positive for weakness and headaches.     Physical Exam Updated Vital Signs BP 142/100   Pulse 75   Temp 98 F (36.7 C) (Oral)   Resp 17   LMP 12/27/2003   SpO2 100%   Physical Exam  Constitutional: She appears well-developed and well-nourished.  HENT:  Head: Normocephalic and atraumatic.  Eyes: Conjunctivae are normal. Right eye exhibits no discharge. Left eye exhibits no discharge.  Neck: Normal range of motion. Neck supple.  Cardiovascular: Normal rate, regular rhythm and normal heart sounds.   Pulmonary/Chest: Effort normal and breath sounds normal.  Abdominal: Soft. There is tenderness (minimal epigastic tenderness).  Neurological: She is alert.  Skin: Skin is warm and dry.   Psychiatric: She has a normal mood and affect.  Nursing note and vitals reviewed.    ED Treatments / Results   DIAGNOSTIC STUDIES:  Oxygen Saturation is 100% on RA, NML by my interpretation.    COORDINATION OF CARE:  9:00 PM will give pt a GI cocktail, Toradol and Zofran in the ED.  Discussed treatment plan with pt at bedside and pt agreed to plan.  Labs (all labs ordered are listed, but only abnormal results are displayed) Labs Reviewed  COMPREHENSIVE METABOLIC PANEL - Abnormal; Notable for the following:       Result Value   Total Protein 8.2 (*)    All other components within normal limits  CBC - Abnormal; Notable for the following:    Hemoglobin 10.3 (*)    HCT 32.4 (*)    MCV 74.3 (*)    MCH 23.6 (*)    RDW 15.6 (*)    All other components within normal limits  URINALYSIS, ROUTINE W REFLEX MICROSCOPIC (NOT AT [facility]) - Abnormal; Notable for the following:    Nitrite POSITIVE (*)    All other components within normal limits  URINE MICROSCOPIC-ADD ON - Abnormal; Notable for the following:    Bacteria, UA RARE (*)    All other components within normal limits  LIPASE, BLOOD  I-STAT BETA HCG BLOOD, ED ([facility], [facility], [facility] ONLY)    Procedures Procedures (including critical care time)  Medications Ordered in ED Medications  sodium chloride 0.9 % bolus 1,000 mL (not administered)  ondansetron (ZOFRAN-ODT) disintegrating tablet 4 mg (4 mg Oral Given 03/18/16 1746)  ondansetron (ZOFRAN) injection 4 mg (4 mg Intravenous Given 03/18/16 2052)  gi cocktail (Maalox,Lidocaine,Donnatal) (30 mLs Oral Given 03/18/16 2052)  ketorolac (TORADOL) 30 MG/ML injection 30 mg (30 mg Intravenous Given 03/18/16 2052)     Initial Impression / Assessment and Plan / ED Course  I have reviewed the triage vital signs and the nursing notes.  Pertinent labs & imaging results that were available during my care of the patient were reviewed by me and considered in my medical decision making (see chart for  details).  Clinical Course    10:59 PM Patient still feels nauseous But states she feels well enough to go home at this time. She has been tolerating oral fluids here.  Vital signs reviewed and are as follows: BP 151/90 (BP Location: Right Arm)   Pulse (!) 56  Temp 98 F (36.7 C) (Oral)   Resp 16   LMP 12/27/2003   SpO2 100%   Reviewed findings with patient at bedside.  Encouraged avoidance of foods which make symptoms worse, clear liquids. Will discharge home with Phenergan and Pepcid.  The patient was urged to return to the Emergency Department immediately with worsening of current symptoms, worsening abdominal pain, persistent vomiting, blood noted in stools, fever, or any other concerns. The patient verbalized understanding.    Final Clinical Impressions(s) / ED Diagnoses   Final diagnoses:  Non-intractable vomiting with nausea, unspecified vomiting type   Patient with vomiting and minimal epigastric abdominal pain. Previous cholecystectomy Vitals are stable, no fever. Labs reassuring. Imaging is not indicated. No signs of dehydration, patient is tolerating PO's. Lungs are clear and no signs suggestive of PNA. Low concern for appendicitis, pancreatitis, ruptured viscus, UTI, kidney stone, aortic dissection, aortic aneurysm or other emergent abdominal etiology. Supportive therapy indicated with return if symptoms worsen.    New Prescriptions New Prescriptions   FAMOTIDINE (PEPCID) 20 MG TABLET    Take 1 tablet (20 mg total) by mouth 2 (two) times daily.   PROMETHAZINE (PHENERGAN) 25 MG TABLET    Take 1 tablet (25 mg total) by mouth every 6 (six) hours as needed for nausea or vomiting.   I personally performed the services described in this documentation, which was scribed in my presence. The recorded information has been reviewed and is accurate.    Renne Crigler, PA-C 03/18/16 2302    Arby Barrette, MD 04/01/16 [phone]

## 2016-03-18 NOTE — ED Triage Notes (Signed)
Pt reports emesis for the past 2 days. Unable to keep fluids down. Also having a HA and generalized body pain.

## 2016-03-18 NOTE — ED Notes (Signed)
I attempted to collect labs and was unsuccessful. 

## 2016-03-18 NOTE — Discharge Instructions (Signed)
Please read and follow all provided instructions.  Your diagnoses today include:  1. Non-intractable vomiting with nausea, unspecified vomiting type     Tests performed today include:  Blood counts and electrolytes  Blood tests to check liver and kidney function  Blood tests to check pancreas function  Urine test to look for infection and pregnancy (in women)  Vital signs. See below for your results today.   Medications prescribed:   Phenergan (promethazine) - for nausea and vomiting   Pepcid (famotidine) - antihistamine  You can find this medication over-the-counter.   DO NOT exceed:   20mg  Pepcid every 12 hours  Take any prescribed medications only as directed.  Home care instructions:   Follow any educational materials contained in this packet.  Follow-up instructions: Please follow-up with your primary care provider in the next 2 days for further evaluation of your symptoms.    Return instructions:  SEEK IMMEDIATE MEDICAL ATTENTION IF:  The pain does not go away or becomes severe   A temperature above 101F develops   Repeated vomiting occurs (multiple episodes)   The pain becomes localized to portions of the abdomen. The right side could possibly be appendicitis. In an adult, the left lower portion of the abdomen could be colitis or diverticulitis.   Blood is being passed in stools or vomit (bright red or black tarry stools)   You develop chest pain, difficulty breathing, dizziness or fainting, or become confused, poorly responsive, or inconsolable (young children)  If you have any other emergent concerns regarding your health  Additional Information: Abdominal (belly) pain can be caused by many things. Your caregiver performed an examination and possibly ordered blood/urine tests and imaging (CT scan, x-rays, ultrasound). Many cases can be observed and treated at home after initial evaluation in the emergency department. Even though you are being  discharged home, abdominal pain can be unpredictable. Therefore, you need a repeated exam if your pain does not resolve, returns, or worsens. Most patients with abdominal pain don't have to be admitted to the hospital or have surgery, but serious problems like appendicitis and gallbladder attacks can start out as nonspecific pain. Many abdominal conditions cannot be diagnosed in one visit, so follow-up evaluations are very important.  Your vital signs today were: BP 142/100    Pulse 75    Temp 98 F (36.7 C) (Oral)    Resp 17    LMP 12/27/2003    SpO2 100%  If your blood pressure (bp) was elevated above 135/85 this visit, please have this repeated by your doctor within one month. --------------

## 2016-03-19 MED FILL — TRIMETHOPRIM 100 MG TABLET: 100 | 30 days supply | Qty: 30 | Fill #0

## 2016-03-19 MED FILL — ESOMEPRAZOLE MAG DR 40 MG C: 40 | 30 days supply | Qty: 30 | Fill #3

## 2016-04-05 ENCOUNTER — Ambulatory Visit: Payer: Medicaid Other | Attending: Internal Medicine

## 2016-04-05 ENCOUNTER — Encounter (HOSPITAL_COMMUNITY): Payer: Self-pay | Admitting: Emergency Medicine

## 2016-04-05 ENCOUNTER — Ambulatory Visit (HOSPITAL_COMMUNITY)
Admission: EM | Admit: 2016-04-05 | Discharge: 2016-04-05 | Disposition: A | Discharge status: Home / Self Care | Payer: Medicaid Other | Attending: Family Medicine | Admitting: Family Medicine

## 2016-04-05 DIAGNOSIS — M609 Myositis, unspecified: Secondary | ICD-10-CM | POA: Insufficient documentation

## 2016-04-05 DIAGNOSIS — J069 Acute upper respiratory infection, unspecified: Secondary | ICD-10-CM

## 2016-04-05 DIAGNOSIS — N730 Acute parametritis and pelvic cellulitis: Secondary | ICD-10-CM | POA: Insufficient documentation

## 2016-04-05 DIAGNOSIS — D509 Iron deficiency anemia, unspecified: Secondary | ICD-10-CM | POA: Insufficient documentation

## 2016-04-05 DIAGNOSIS — K5900 Constipation, unspecified: Secondary | ICD-10-CM | POA: Insufficient documentation

## 2016-04-05 DIAGNOSIS — B009 Herpesviral infection, unspecified: Secondary | ICD-10-CM | POA: Insufficient documentation

## 2016-04-05 DIAGNOSIS — G43909 Migraine, unspecified, not intractable, without status migrainosus: Secondary | ICD-10-CM | POA: Insufficient documentation

## 2016-04-05 DIAGNOSIS — M792 Neuralgia and neuritis, unspecified: Secondary | ICD-10-CM | POA: Insufficient documentation

## 2016-04-05 DIAGNOSIS — J029 Acute pharyngitis, unspecified: Secondary | ICD-10-CM | POA: Insufficient documentation

## 2016-04-05 DIAGNOSIS — Z79899 Other long term (current) drug therapy: Secondary | ICD-10-CM | POA: Insufficient documentation

## 2016-04-05 DIAGNOSIS — Z9889 Other specified postprocedural states: Secondary | ICD-10-CM | POA: Insufficient documentation

## 2016-04-05 DIAGNOSIS — K219 Gastro-esophageal reflux disease without esophagitis: Secondary | ICD-10-CM | POA: Insufficient documentation

## 2016-04-05 DIAGNOSIS — N62 Hypertrophy of breast: Secondary | ICD-10-CM | POA: Insufficient documentation

## 2016-04-05 DIAGNOSIS — R42 Dizziness and giddiness: Secondary | ICD-10-CM | POA: Insufficient documentation

## 2016-04-05 DIAGNOSIS — R112 Nausea with vomiting, unspecified: Secondary | ICD-10-CM | POA: Insufficient documentation

## 2016-04-05 DIAGNOSIS — E669 Obesity, unspecified: Secondary | ICD-10-CM | POA: Insufficient documentation

## 2016-04-05 DIAGNOSIS — J189 Pneumonia, unspecified organism: Secondary | ICD-10-CM | POA: Insufficient documentation

## 2016-04-05 LAB — POCT RAPID STREP A: Streptococcus, Group A Screen (Direct): NEGATIVE

## 2016-04-05 MED ORDER — GUAIFENESIN-CODEINE 100-10 MG/5ML PO SYRP: 10.0000 mL | ORAL_SOLUTION | Freq: Four times a day (QID) | ORAL | 0 refills | Status: DC | PRN

## 2016-04-05 MED ORDER — IPRATROPIUM BROMIDE 0.06 % NA SOLN: 2.0000 | Freq: Four times a day (QID) | NASAL | 1 refills | Status: DC

## 2016-04-05 NOTE — Discharge Instructions (Signed)
Drink plenty of fluids as discussed, use medicine as prescribed, and mucinex or delsym for cough. Return or see your doctor if further problems °

## 2016-04-05 NOTE — ED Triage Notes (Signed)
Here for cold sx onset 1 week associated w/prod cough, ST, SOB, sneezing, nasal drainage/congestion  Denies fevers  A&O x4... NAD

## 2016-04-05 NOTE — ED Provider Notes (Signed)
[facility]    CSN: [id number] Arrival date & time: 04/05/16  1752     History   Chief Complaint Chief Complaint  Patient presents with  . URI    HPI [name] is a 40 y.o. female.   The history is provided by the patient.  URI  Presenting symptoms: congestion, cough, rhinorrhea and sore throat   Presenting symptoms: no fever   Severity:  Mild Onset quality:  Gradual Duration:  1 week Progression:  Unchanged Chronicity:  New Relieved by:  None tried Worsened by:  Nothing Ineffective treatments:  None tried Associated symptoms: no sinus pain and no wheezing   Risk factors: sick contacts   Risk factors comment:  Son is also ill.   Past Medical History:  Diagnosis Date  . Anemia   . Constipation   . GERD (gastroesophageal reflux disease)   . Hx of endometriosis   . Macromastia 07/2011  . Migraine     Patient Active Problem List   Diagnosis Date Noted  . Disturbance of skin sensation 04/27/2014  . Dizziness and giddiness 03/14/2014  . Headache, migraine 03/14/2014  . Myalgia and myositis, unspecified 06/07/2013  . CAP (community acquired pneumonia) 08/22/2012  . Nausea and vomiting in adult 08/22/2012  . Viral syndrome 08/22/2012  . Pelvic pain in female 12/18/2010  . PID (acute pelvic inflammatory disease) 12/18/2010  . ANEMIA-IRON DEFICIENCY 10/15/2006  . HERPES, GENITAL NOS 09/30/2006  . OBESITY NOS 09/30/2006  . DISORDER, NONORGANIC SLEEP NOS 09/30/2006  . CONSTIPATION NOS 09/30/2006  . NEURITIS, LUMBOSACRAL NOS 09/30/2006  . HX, PERSONAL, MENTAL DISORDER NOS 09/30/2006    Past Surgical History:  Procedure Laterality Date  . ABDOMINAL HYSTERECTOMY  12/12/2003  . ADENOIDECTOMY  05/2011  . ANKLE ARTHROSCOPY  12/29/2007   right; with extensive debridement  . BLADDER NECK RECONSTRUCTION  01/14/2011   Procedure: BLADDER NECK REPAIR;  Surgeon: Reva Bores, MD;  Location: [facility];  Service: Gynecology;  Laterality: N/A;  Laparoscopic  Repair of Incidental Cystotomy  . BREAST REDUCTION SURGERY  08/05/2011   Procedure: MAMMARY REDUCTION  (BREAST);  Surgeon: Louisa Second, MD;  Location:  S[facility]ERY [facility]ENTER;  Service: Plastics;  Laterality: Bilateral;  bilateral  . CESAREAN SECTION  12/29/2000; 1994  . DILATION AND CURETTAGE OF UTERUS  07/08/2003   open laparoscopy  . LAPAROSCOPIC SALPINGOOPHERECTOMY  01/14/2011   left  . REPAIR PERONEAL TENDONS ANKLE  01/03/2010   repair right subluxing peroneal tendons  . RIGHT OOPHORECTOMY     with lysis of adhesions  . SALPINGECTOMY  12/12/2003   right; with TAH  . TONSILLECTOMY  as a child  . TUBAL LIGATION  12/29/2000    OB History    Gravida Para Term Preterm AB Living   2 2 2     2    SAB TAB Ectopic Multiple Live Births                   Home Medications    Prior to Admission medications   Medication Sig Start Date End Date Taking? Authorizing Provider  acetaminophen (TYLENOL) 500 MG tablet Take 1,000 mg by mouth every 6 (six) hours as needed for mild pain (pain).    Yes Historical Provider, MD  docusate sodium (COLACE) 100 MG capsule Take 100 mg by mouth 2 (two) times daily as needed for moderate constipation.    Yes Historical Provider, MD  esomeprazole (NEXIUM) 40 MG capsule Take 1 capsule (40 mg total) by mouth  every morning. 12/06/15  Yes Henrietta HooverLinda C Bernhardt, NP  polyethylene glycol (MIRALAX / GLYCOLAX) packet Take 17 g by mouth daily as needed for moderate constipation.   Yes Historical Provider, MD  promethazine (PHENERGAN) 25 MG tablet Take 1 tablet (25 mg total) by mouth every 6 (six) hours as needed for nausea or vomiting. 03/18/16  Yes Renne CriglerJoshua Geiple, PA-C  valACYclovir (VALTREX) 500 MG tablet Take 1 tablet (500 mg total) by mouth 2 (two) times daily as needed (outbreaks). 12/06/15  Yes Henrietta HooverLinda C Bernhardt, NP  famotidine (PEPCID) 20 MG tablet Take 1 tablet (20 mg total) by mouth 2 (two) times daily. 03/18/16   Renne CriglerJoshua Geiple, PA-C  ibuprofen (ADVIL,MOTRIN) 200 MG tablet  Take 200-600 mg by mouth every 6 (six) hours as needed for moderate pain.     Historical Provider, MD    Family History Family History  Problem Relation Age of Onset  . Diabetes Mother   . Hypertension Mother   . Thyroid disease Sister     Social History Social History  Substance Use Topics  . Smoking status: Never Smoker  . Smokeless tobacco: Never Used  . Alcohol use No     Allergies   Other   Review of Systems Review of Systems  Constitutional: Negative.  Negative for fever.  HENT: Positive for congestion, postnasal drip, rhinorrhea and sore throat. Negative for sinus pain and sinus pressure.   Respiratory: Positive for cough. Negative for shortness of breath and wheezing.   Cardiovascular: Negative.   Gastrointestinal: Negative.   Genitourinary: Negative.   Musculoskeletal: Negative.   All other systems reviewed and are negative.    Physical Exam Triage Vital Signs ED Triage Vitals [04/05/16 1814]  Enc Vitals Group     BP 126/76     Pulse Rate 96     Resp 20     Temp 98.7 F (37.1 C)     Temp Source Oral     SpO2 100 %     Weight      Height      Head Circumference      Peak Flow      Pain Score 7     Pain Loc      Pain Edu?      Excl. in GC?    No data found.   Updated Vital Signs BP 126/76 (BP Location: Left Arm)   Pulse 96   Temp 98.7 F (37.1 C) (Oral)   Resp 20   LMP 12/27/2003   SpO2 100%   Visual Acuity Right Eye Distance:   Left Eye Distance:   Bilateral Distance:    Right Eye Near:   Left Eye Near:    Bilateral Near:     Physical Exam  Constitutional: She is oriented to person, place, and time. She appears well-developed and well-nourished.  HENT:  Right Ear: External ear normal.  Left Ear: External ear normal.  Nose: Nose normal.  Mouth/Throat: Oropharynx is clear and moist.  Eyes: Pupils are equal, round, and reactive to light.  Neck: Normal range of motion. Neck supple.  Cardiovascular: Normal rate, regular rhythm,  normal heart sounds and intact distal pulses.   Pulmonary/Chest: Effort normal and breath sounds normal. She has no wheezes.  Abdominal: Soft. Bowel sounds are normal.  Lymphadenopathy:    She has no cervical adenopathy.  Neurological: She is alert and oriented to person, place, and time.  Nursing note and vitals reviewed.    UC Treatments / Results  Labs (  all labs ordered are listed, but only abnormal results are displayed) Labs Reviewed - No data to display  EKG  EKG Interpretation None       Radiology No results found.  Procedures Procedures (including critical care time)  Medications Ordered in UC Medications - No data to display   Initial Impression / Assessment and Plan / UC Course  I have reviewed the triage vital signs and the nursing notes.  Pertinent labs & imaging results that were available during my care of the patient were reviewed by me and considered in my medical decision making (see chart for details).  Clinical Course       Final Clinical Impressions(s) / UC Diagnoses   Final diagnoses:  None    New Prescriptions New Prescriptions   No medications on file     Linna HoffJames D Kindl, MD 04/05/16 [phone]

## 2016-04-08 LAB — CULTURE, GROUP A STREP (THRC)

## 2016-04-16 MED FILL — ESOMEPRAZOLE MAG DR 40 MG C: 40 | 30 days supply | Qty: 30 | Fill #4

## 2016-05-11 ENCOUNTER — Ambulatory Visit (INDEPENDENT_AMBULATORY_CARE_PROVIDER_SITE_OTHER): Payer: Self-pay

## 2016-05-11 ENCOUNTER — Encounter (HOSPITAL_COMMUNITY): Payer: Self-pay | Admitting: Emergency Medicine

## 2016-05-11 ENCOUNTER — Ambulatory Visit (HOSPITAL_COMMUNITY)
Admission: EM | Admit: 2016-05-11 | Discharge: 2016-05-11 | Disposition: A | Discharge status: Home / Self Care | Payer: Self-pay | Attending: Family Medicine | Admitting: Family Medicine

## 2016-05-11 DIAGNOSIS — R05 Cough: Secondary | ICD-10-CM

## 2016-05-11 DIAGNOSIS — J989 Respiratory disorder, unspecified: Secondary | ICD-10-CM

## 2016-05-11 DIAGNOSIS — R059 Cough, unspecified: Secondary | ICD-10-CM

## 2016-05-11 MED ORDER — HYDROCODONE-HOMATROPINE 5-1.5 MG/5ML PO SYRP: 5.0000 mL | ORAL_SOLUTION | Freq: Four times a day (QID) | ORAL | 0 refills | Status: DC | PRN

## 2016-05-11 MED ORDER — ALBUTEROL SULFATE HFA 108 (90 BASE) MCG/ACT IN AERS: 1.0000 | INHALATION_SPRAY | Freq: Four times a day (QID) | RESPIRATORY_TRACT | 0 refills | Status: DC | PRN

## 2016-05-11 MED ORDER — AZITHROMYCIN 250 MG PO TABS: 250.0000 mg | ORAL_TABLET | Freq: Every day | ORAL | 0 refills | Status: DC

## 2016-05-11 NOTE — ED Provider Notes (Signed)
CSN: [id number]     Arrival date & time 05/11/16  1629 History   First MD Initiated Contact with Patient 05/11/16 1800     Chief Complaint  Patient presents with  . URI   (Consider location/radiation/quality/duration/timing/severity/associated sxs/prior Treatment) 41 yo female presents with ongoing cough and congestion that never really improved since appt. here in December. At that time she was diagnosed with a viral infection,. She did improve but her cough really remained. In the last week it has worsened and is productive with yellow sputum. Subjective fevers. Mild nasal congestion is noted.       Past Medical History:  Diagnosis Date  . Anemia   . Constipation   . GERD (gastroesophageal reflux disease)   . Hx of endometriosis   . Macromastia 07/2011  . Migraine    Past Surgical History:  Procedure Laterality Date  . ABDOMINAL HYSTERECTOMY  12/12/2003  . ADENOIDECTOMY  05/2011  . ANKLE ARTHROSCOPY  12/29/2007   right; with extensive debridement  . BLADDER NECK RECONSTRUCTION  01/14/2011   Procedure: BLADDER NECK REPAIR;  Surgeon: Reva Bores, MD;  Location: [facility];  Service: Gynecology;  Laterality: N/A;  Laparoscopic Repair of Incidental Cystotomy  . BREAST REDUCTION SURGERY  08/05/2011   Procedure: MAMMARY REDUCTION  (BREAST);  Surgeon: Louisa Second, MD;  Location: [facility];  Service: Plastics;  Laterality: Bilateral;  bilateral  . CESAREAN SECTION  12/29/2000; 1994  . DILATION AND CURETTAGE OF UTERUS  07/08/2003   open laparoscopy  . LAPAROSCOPIC SALPINGOOPHERECTOMY  01/14/2011   left  . REPAIR PERONEAL TENDONS ANKLE  01/03/2010   repair right subluxing peroneal tendons  . RIGHT OOPHORECTOMY     with lysis of adhesions  . SALPINGECTOMY  12/12/2003   right; with TAH  . TONSILLECTOMY  as a child  . TUBAL LIGATION  12/29/2000   Family History  Problem Relation Age of Onset  . Diabetes Mother   . Hypertension Mother   . Thyroid disease Sister    Social  History  Substance Use Topics  . Smoking status: Never Smoker  . Smokeless tobacco: Never Used  . Alcohol use No   OB History    Gravida Para Term Preterm AB Living   2 2 2     2    SAB TAB Ectopic Multiple Live Births                 Review of Systems  Constitutional: Positive for fatigue and fever.  HENT: Positive for congestion. Negative for sinus pressure.   Respiratory: Positive for cough and chest tightness. Negative for shortness of breath and wheezing.     Allergies  Other  Home Medications   Prior to Admission medications   Medication Sig Start Date End Date Taking? Authorizing Provider  esomeprazole (NEXIUM) 40 MG capsule Take 1 capsule (40 mg total) by mouth every morning. 12/06/15  Yes Henrietta Hoover, NP  acetaminophen (TYLENOL) 500 MG tablet Take 1,000 mg by mouth every 6 (six) hours as needed for mild pain (pain).     Historical Provider, MD  albuterol (PROVENTIL HFA;VENTOLIN HFA) 108 (90 Base) MCG/ACT inhaler Inhale 1-2 puffs into the lungs every 6 (six) hours as needed for wheezing or shortness of breath (cough). 05/11/16   Riki Sheer, PA-C  azithromycin (ZITHROMAX) 250 MG tablet Take 1 tablet (250 mg total) by mouth daily. Take first 2 tablets together, then 1 every day until finished. 05/11/16   Riki Sheer, PA-C  docusate sodium (COLACE) 100 MG capsule Take 100 mg by mouth 2 (two) times daily as needed for moderate constipation.     Historical Provider, MD  famotidine (PEPCID) 20 MG tablet Take 1 tablet (20 mg total) by mouth 2 (two) times daily. 03/18/16   Renne CriglerJoshua Geiple, PA-C  guaiFENesin-codeine (ROBITUSSIN AC) 100-10 MG/5ML syrup Take 10 mLs by mouth 4 (four) times daily as needed for cough. 04/05/16   Linna HoffJames D Kindl, MD  HYDROcodone-homatropine [facility](HYCODAN) 5-1.5 MG/5ML syrup Take 5 mLs by mouth every 6 (six) hours as needed for cough. 05/11/16   Riki SheerMichelle G Cleotha Tsang, PA-C  ibuprofen (ADVIL,MOTRIN) 200 MG tablet Take 200-600 mg by mouth every 6 (six) hours as  needed for moderate pain.     Historical Provider, MD  ipratropium (ATROVENT) 0.06 % nasal spray Place 2 sprays into both nostrils 4 (four) times daily. 04/05/16   Linna HoffJames D Kindl, MD  polyethylene glycol (MIRALAX / Ethelene HalGLYCOLAX) packet Take 17 g by mouth daily as needed for moderate constipation.    Historical Provider, MD  promethazine (PHENERGAN) 25 MG tablet Take 1 tablet (25 mg total) by mouth every 6 (six) hours as needed for nausea or vomiting. 03/18/16   Renne CriglerJoshua Geiple, PA-C  valACYclovir (VALTREX) 500 MG tablet Take 1 tablet (500 mg total) by mouth 2 (two) times daily as needed (outbreaks). 12/06/15   Henrietta HooverLinda C Bernhardt, NP   Meds Ordered and Administered this Visit  Medications - No data to display  BP 142/85 (BP Location: Left Arm)   Pulse 71   Temp 98.2 F (36.8 C) (Oral)   Resp 20   LMP 12/27/2003   SpO2 100%  No data found.   Physical Exam  Constitutional: She is oriented to person, place, and time. She appears well-developed and well-nourished. No distress.  Appears fatigued  HENT:  Head: Normocephalic and atraumatic.  Right Ear: External ear normal.  Left Ear: External ear normal.  Nose: Nose normal.  Mouth/Throat: Oropharynx is clear and moist.  Cardiovascular: Normal rate and regular rhythm.   Pulmonary/Chest: Effort normal. No respiratory distress.  Crackles and mild expiratory wheeze in the right base, rhonchi in the left  Lymphadenopathy:    She has no cervical adenopathy.  Neurological: She is alert and oriented to person, place, and time.  Skin: Skin is warm and dry. She is not diaphoretic.  Psychiatric: Her behavior is normal.  Nursing note and vitals reviewed.   Urgent Care Course   Clinical Course     Procedures (including critical care time)  Labs Review Labs Reviewed - No data to display  Imaging Review Dg Chest 2 View  Result Date: 05/11/2016 CLINICAL DATA:  Cough EXAM: CHEST  2 VIEW COMPARISON:  Sep 16, 2012 FINDINGS: There is no edema or  consolidation. Heart size and pulmonary vascularity are normal. No adenopathy. No pneumothorax. No bone lesions. IMPRESSION: No edema or consolidation. Electronically Signed   By: Bretta BangWilliam  Woodruff III M.D.   On: 05/11/2016 18:40     Visual Acuity Review  Right Eye Distance:   Left Eye Distance:   Bilateral Distance:    Right Eye Near:   Left Eye Near:    Bilateral Near:         MDM   1. Respiratory illness   2. Cough    Given duration will cover with antibiotic therapy. Cough syrup for night time, and Albuterol for wheeze and cough. Symptomatic care also discussed. F/U as needed. Patient stable for discharge.  Riki Sheer, PA-C 05/11/16 [phone]

## 2016-05-11 NOTE — ED Triage Notes (Signed)
Patient reports a recurrent symptoms since December.  Patient continues to have cough, nasal congestion and drainage, no fever

## 2016-05-11 NOTE — Discharge Instructions (Signed)
No evidence of pneumonia. Give the duration of your symptoms will treat with an antibiotic. Also use Delsym OTC for cough during the day, and the hycodan at night time to help you rest. Drink plenty of water. F/U as needed.

## 2016-06-06 MED FILL — ?ESOMEPRAZOLE MAG DR 40MG C: 40 | 30 days supply | Qty: 30 | Fill #5

## 2016-06-07 ENCOUNTER — Ambulatory Visit: Payer: Self-pay | Admitting: Family Medicine

## 2016-06-25 ENCOUNTER — Ambulatory Visit (HOSPITAL_COMMUNITY)
Admission: EM | Admit: 2016-06-25 | Discharge: 2016-06-25 | Disposition: A | Discharge status: Nursing Home (Includes ICF) | Payer: Self-pay

## 2016-06-26 ENCOUNTER — Encounter: Payer: Self-pay | Admitting: Family Medicine

## 2016-06-26 ENCOUNTER — Ambulatory Visit (INDEPENDENT_AMBULATORY_CARE_PROVIDER_SITE_OTHER): Payer: No Typology Code available for payment source | Admitting: Family Medicine

## 2016-06-26 VITALS — BP 136/82 | HR 78 | Temp 98.4°F | Resp 16 | Ht 65.0 in | Wt 214.0 lb

## 2016-06-26 DIAGNOSIS — R110 Nausea: Secondary | ICD-10-CM

## 2016-06-26 DIAGNOSIS — K219 Gastro-esophageal reflux disease without esophagitis: Secondary | ICD-10-CM

## 2016-06-26 MED ORDER — ONDANSETRON HCL 4 MG PO TABS: 4.0000 mg | ORAL_TABLET | Freq: Three times a day (TID) | ORAL | 0 refills | Status: DC | PRN

## 2016-06-26 MED ORDER — ESOMEPRAZOLE MAGNESIUM 40 MG PO CPDR: 40.0000 mg | DELAYED_RELEASE_CAPSULE | Freq: Every morning | ORAL | 1 refills | Status: DC

## 2016-06-26 MED FILL — ?ONDANSETRON HCL 4 MG TABLE: 4 | 10 days supply | Qty: 30 | Fill #0

## 2016-06-26 NOTE — Patient Instructions (Addendum)
Food Choices for Gastroesophageal Reflux Disease, Adult When you have gastroesophageal reflux disease (GERD), the foods you eat and your eating habits are very important. Choosing the right foods can help ease your discomfort. What guidelines do I need to follow?  Choose fruits, vegetables, whole grains, and low-fat dairy products.  Choose low-fat meat, fish, and poultry.  Limit fats such as oils, salad dressings, butter, nuts, and avocado.  Keep a food diary. This helps you identify foods that cause symptoms.  Avoid foods that cause symptoms. These may be different for everyone.  Eat small meals often instead of 3 large meals a day.  Eat your meals slowly, in a place where you are relaxed.  Limit fried foods.  Cook foods using methods other than frying.  Avoid drinking alcohol.  Avoid drinking large amounts of liquids with your meals.  Avoid bending over or lying down until 2-3 hours after eating. What foods are not recommended? These are some foods and drinks that may make your symptoms worse: Vegetables  Tomatoes. Tomato juice. Tomato and spaghetti sauce. Chili peppers. Onion and garlic. Horseradish. Fruits  Oranges, grapefruit, and lemon (fruit and juice). Meats  High-fat meats, fish, and poultry. This includes hot dogs, ribs, ham, sausage, salami, and bacon. Dairy  Whole milk and chocolate milk. Sour cream. Cream. Butter. Ice cream. Cream cheese. Drinks  Coffee and tea. Bubbly (carbonated) drinks or energy drinks. Condiments  Hot sauce. Barbecue sauce. Sweets/Desserts  Chocolate and cocoa. Donuts. Peppermint and spearmint. Fats and Oils  High-fat foods. This includes French fries and potato chips. Other  Vinegar. Strong spices. This includes black pepper, white pepper, red pepper, cayenne, curry powder, cloves, ginger, and chili powder. The items listed above may not be a complete list of foods and drinks to avoid. Contact your dietitian for more information.    This information is not intended to replace advice given to you by your health care provider. Make sure you discuss any questions you have with your health care provider. Document Released: 10/22/2011 Document Revised: 09/28/2015 Document Reviewed: 02/24/2013 Elsevier Interactive Patient Education  2017 [employer] Inc[employer]  

## 2016-06-27 DIAGNOSIS — K219 Gastro-esophageal reflux disease without esophagitis: Secondary | ICD-10-CM | POA: Insufficient documentation

## 2016-06-27 NOTE — Progress Notes (Signed)
Subjective:    Patient ID: [name], female    DOB: 1976-04-12, 41 y.o.   MRN: [id number]  Gastroesophageal Reflux  She complains of belching, heartburn and nausea. She reports no abdominal pain, no chest pain, no choking, no coughing, no dysphagia, no early satiety, no globus sensation, no hoarse voice, no sore throat, no stridor, no tooth decay, no water brash or no wheezing. This is a recurrent problem. The current episode started more than 1 year ago. The problem occurs frequently. The problem has been gradually improving. The heartburn is of moderate intensity. The heartburn does not wake her from sleep. The heartburn does not limit her activity. The heartburn doesn't change with position. The symptoms are aggravated by certain foods. Associated symptoms include anemia. Pertinent negatives include no fatigue, melena, muscle weakness, orthopnea or weight loss. Risk factors include caffeine use, lack of exercise, NSAIDs and obesity. The treatment provided mild relief. Past procedures do not include an abdominal ultrasound, an EGD, esophageal manometry, esophageal pH monitoring, H. pylori antibody titer or a UGI. Past invasive treatments do not include gastroplasty, gastroplication or reflux surgery.   Past Medical History:  Diagnosis Date  . Anemia   . Constipation   . GERD (gastroesophageal reflux disease)   . Hx of endometriosis   . Macromastia 07/2011  . Migraine    Social History   Social History  . Marital status: Divorced    Spouse name: N/A  . Number of children: 2  . Years of education: 12th   Occupational History  .  Other    [employer]   Social History Main Topics  . Smoking status: Never Smoker  . Smokeless tobacco: Never Used  . Alcohol use No  . Drug use: No  . Sexual activity: Not on file   Other Topics Concern  . Not on file   Social History Narrative   Patient lives at home with family.   Caffeine Use: 1 cup daily   Immunization History   Administered Date(s) Administered  . Influenza,inj,Quad PF,36+ Mos 02/15/2016  . Td 05/06/1994  . Tdap 12/06/2015   Allergies  Allergen Reactions  . Other     Seasonal allergies      Review of Systems  Constitutional: Negative for fatigue and weight loss.  HENT: Negative.  Negative for hoarse voice and sore throat.   Respiratory: Negative.  Negative for cough, choking and wheezing.   Cardiovascular: Negative for chest pain.  Gastrointestinal: Positive for heartburn and nausea. Negative for abdominal pain, dysphagia and melena.       Heartburn  Endocrine: Negative.  Negative for cold intolerance, heat intolerance, polydipsia, polyphagia and polyuria.  Genitourinary: Negative.   Musculoskeletal: Negative for muscle weakness.  Allergic/Immunologic: Negative.   Neurological: Negative.   Hematological: Negative.   Psychiatric/Behavioral: Negative.        Objective:   Physical Exam  Constitutional: She is oriented to person, place, and time. She appears well-developed and well-nourished.  HENT:  Head: Normocephalic and atraumatic.  Right Ear: External ear normal.  Left Ear: External ear normal.  Mouth/Throat: Oropharynx is clear and moist.  Eyes: Conjunctivae and EOM are normal. Pupils are equal, round, and reactive to light.  Neck: Normal range of motion. Neck supple.  Cardiovascular: Normal rate, regular rhythm, normal heart sounds and intact distal pulses.   Pulmonary/Chest: Effort normal and breath sounds normal.  Abdominal: Soft. Bowel sounds are normal.  Abdominal obesity  Neurological: She is alert and oriented to person, place,  and time.  Skin: Skin is warm and dry.      BP 136/82 (BP Location: Right Arm, Patient Position: Sitting, Cuff Size: Large)   Pulse 78   Temp 98.4 F (36.9 C) (Oral)   Resp 16   Ht 5\' 5"  (1.651 m)   Wt 214 lb (97.1 kg)   LMP 12/27/2003   SpO2 100%   BMI 35.61 kg/m  Assessment & Plan:  1. Gastroesophageal reflux disease,  esophagitis presence not specified The patient is asked to make an attempt to improve diet and exercise patterns to aid in medical management of this problem. - esomeprazole (NEXIUM) 40 MG capsule; Take 1 capsule (40 mg total) by mouth every morning.  Dispense: 90 capsule; Refill: 1  2. Nausea - ondansetron (ZOFRAN) 4 MG tablet; Take 1 tablet (4 mg total) by mouth every 8 (eight) hours as needed for nausea or vomiting.  Dispense: 30 tablet; Refill: 0   RTC: 6 months for anemia and GERD   Cornelio Parkerson M, FNP    The patient was given clear instructions to go to ER or return to medical center if symptoms do not improve, worsen or new problems develop. The patient verbalized understanding. Will notify patient with laboratory results.

## 2016-07-10 MED FILL — ESOMEPRAZOLE MAG DR 40 MG C: 40 | 30 days supply | Qty: 30 | Fill #0

## 2016-07-20 ENCOUNTER — Ambulatory Visit (HOSPITAL_COMMUNITY)
Admission: EM | Admit: 2016-07-20 | Discharge: 2016-07-20 | Discharge status: Against Medical Advice | Payer: No Typology Code available for payment source

## 2016-07-20 NOTE — ED Notes (Addendum)
Was told by front staff pt will be going down to ED

## 2016-07-24 ENCOUNTER — Ambulatory Visit: Payer: No Typology Code available for payment source | Admitting: Family Medicine

## 2016-07-24 ENCOUNTER — Encounter: Payer: Self-pay | Admitting: Family Medicine

## 2016-07-24 ENCOUNTER — Ambulatory Visit (INDEPENDENT_AMBULATORY_CARE_PROVIDER_SITE_OTHER): Payer: No Typology Code available for payment source | Admitting: Family Medicine

## 2016-07-24 VITALS — BP 140/80 | HR 74 | Temp 97.8°F | Ht 65.0 in | Wt 215.0 lb

## 2016-07-24 DIAGNOSIS — R0989 Other specified symptoms and signs involving the circulatory and respiratory systems: Secondary | ICD-10-CM

## 2016-07-24 DIAGNOSIS — J029 Acute pharyngitis, unspecified: Secondary | ICD-10-CM

## 2016-07-24 DIAGNOSIS — H9201 Otalgia, right ear: Secondary | ICD-10-CM

## 2016-07-24 MED ORDER — ACETAMINOPHEN 500 MG PO TABS: 500.0000 mg | ORAL_TABLET | Freq: Four times a day (QID) | ORAL | 0 refills | Status: DC | PRN

## 2016-07-24 MED ORDER — AZITHROMYCIN 250 MG PO TABS: ORAL_TABLET | ORAL | 0 refills | Status: DC

## 2016-07-24 MED ORDER — CETIRIZINE HCL 10 MG PO TABS
10.0000 mg | ORAL_TABLET | Freq: Every day | ORAL | 11 refills | Status: DC
Start: 2016-07-24 — End: 2017-07-14

## 2016-07-24 MED FILL — ?CETIRIZINE HCL 10 MG TABLE: 10 | 30 days supply | Qty: 30 | Fill #0

## 2016-07-24 MED FILL — AZITHROMYCIN 250 MG TABLET: 250 | 5 days supply | Qty: 6 | Fill #0

## 2016-07-24 NOTE — Progress Notes (Signed)
Subjective:    Patient ID: [name], female    DOB: 1976/03/16, 41 y.o.   MRN: [id number]  URI   This is a new problem. The current episode started 1 to 4 weeks ago. The problem has been gradually worsening. There has been no fever. Associated symptoms include congestion, coughing, ear pain, headaches, joint pain and sinus pain. Pertinent negatives include no chest pain, dysuria, nausea, neck pain, plugged ear sensation, rash, swollen glands, vomiting or wheezing. She has tried decongestant for the symptoms. The treatment provided no relief.   Past Medical History:  Diagnosis Date  . Anemia   . Constipation   . GERD (gastroesophageal reflux disease)   . Hx of endometriosis   . Macromastia 07/2011  . Migraine    Social History   Social History  . Marital status: Divorced    Spouse name: N/A  . Number of children: 2  . Years of education: 12th   Occupational History  .  Other    [employer]   Social History Main Topics  . Smoking status: Never Smoker  . Smokeless tobacco: Never Used  . Alcohol use No  . Drug use: No  . Sexual activity: Not on file   Other Topics Concern  . Not on file   Social History Narrative   Patient lives at home with family.   Caffeine Use: 1 cup daily   Immunization History  Administered Date(s) Administered  . Influenza,inj,Quad PF,36+ Mos 02/15/2016  . Td 05/06/1994  . Tdap 12/06/2015   Review of Systems  HENT: Positive for congestion, ear pain and sinus pain.   Respiratory: Positive for cough. Negative for wheezing.   Cardiovascular: Negative.  Negative for chest pain.  Gastrointestinal: Negative.  Negative for nausea and vomiting.  Genitourinary: Negative for dysuria.  Musculoskeletal: Positive for joint pain. Negative for neck pain.  Skin: Negative for rash.  Allergic/Immunologic: Negative.   Neurological: Positive for headaches.  Hematological: Negative.   Psychiatric/Behavioral: Negative.        Objective:   Physical  Exam  Constitutional: She is oriented to person, place, and time. She has a sickly appearance.  HENT:  Head: Normocephalic and atraumatic.  Right Ear: External ear normal. Tympanic membrane is erythematous.  Left Ear: External ear normal.  Nose: Mucosal edema present.  Mouth/Throat: Posterior oropharyngeal erythema present.  Eyes: Conjunctivae and EOM are normal. Pupils are equal, round, and reactive to light.  Neck: Normal range of motion. Neck supple.  Cardiovascular: Normal rate, regular rhythm, normal heart sounds and intact distal pulses.   Pulmonary/Chest: Effort normal and breath sounds normal.  Abdominal: Soft. Bowel sounds are normal.  Neurological: She is alert and oriented to person, place, and time.  Skin: Skin is warm and dry.      BP 140/80 (BP Location: Right Arm, Patient Position: Sitting, Cuff Size: Large)   Pulse 74   Temp 97.8 F (36.6 C) (Oral)   Ht 5\' 5"  (1.651 m)   Wt 215 lb (97.5 kg)   LMP 12/27/2003   SpO2 100%   BMI 35.78 kg/m  Assessment & Plan:  1. Symptoms of upper respiratory infection (URI) - azithromycin (ZITHROMAX) 250 MG tablet; Take 500 mg, days 2-5 250 mg  Dispense: 6 tablet; Refill: 0 - cetirizine (ZYRTEC) 10 MG tablet; Take 1 tablet (10 mg total) by mouth daily.  Dispense: 30 tablet; Refill: 11 - acetaminophen (TYLENOL) 500 MG tablet; Take 1 tablet (500 mg total) by mouth every 6 (six) hours  as needed for mild pain (pain).  Dispense: 30 tablet; Refill: 0  2. Right ear pain - azithromycin (ZITHROMAX) 250 MG tablet; Take 500 mg, days 2-5 250 mg  Dispense: 6 tablet; Refill: 0  3. Sore throat - acetaminophen (TYLENOL) 500 MG tablet; Take 1 tablet (500 mg total) by mouth every 6 (six) hours as needed for mild pain (pain).  Dispense: 30 tablet; Refill: 0   RTC: 1 month for pap smear  Nolon Nations  MSN, FNP-C [facility] [address] [place], [place] [address] [phone] The patient was given clear  instructions to go to ER or return to medical center if symptoms do not improve, worsen or new problems develop. The patient verbalized understanding.    [name]

## 2016-07-24 NOTE — Patient Instructions (Addendum)
Start Azithromycin 500 mg on day 1; days 2-5 take 250 mg.  Increase rest, handwashing, fluid intake and vitamin C intake Upper Respiratory Infection, Adult Most upper respiratory infections (URIs) are caused by a virus. A URI affects the nose, throat, and upper air passages. The most common type of URI is often called "the common cold." Follow these instructions at home:  Take medicines only as told by your doctor.  Gargle warm saltwater or take cough drops to comfort your throat as told by your doctor.  Use a warm mist humidifier or inhale steam from a shower to increase air moisture. This may make it easier to breathe.  Drink enough fluid to keep your pee (urine) clear or pale yellow.  Eat soups and other clear broths.  Have a healthy diet.  Rest as needed.  Go back to work when your fever is gone or your doctor says it is okay.  You may need to stay home longer to avoid giving your URI to others.  You can also wear a face mask and wash your hands often to prevent spread of the virus.  Use your inhaler more if you have asthma.  Do not use any tobacco products, including cigarettes, chewing tobacco, or electronic cigarettes. If you need help quitting, ask your doctor. Contact a doctor if:  You are getting worse, not better.  Your symptoms are not helped by medicine.  You have chills.  You are getting more short of breath.  You have brown or red mucus.  You have yellow or brown discharge from your nose.  You have pain in your face, especially when you bend forward.  You have a fever.  You have puffy (swollen) neck glands.  You have pain while swallowing.  You have white areas in the back of your throat. Get help right away if:  You have very bad or constant:  Headache.  Ear pain.  Pain in your forehead, behind your eyes, and over your cheekbones (sinus pain).  Chest pain.  You have long-lasting (chronic) lung disease and any of the  following:  Wheezing.  Long-lasting cough.  Coughing up blood.  A change in your usual mucus.  You have a stiff neck.  You have changes in your:  Vision.  Hearing.  Thinking.  Mood. This information is not intended to replace advice given to you by your health care provider. Make sure you discuss any questions you have with your health care provider. Document Released: 10/09/2007 Document Revised: 12/24/2015 Document Reviewed: 07/28/2013 Elsevier Interactive Patient Education  2017 [employer]

## 2016-08-14 ENCOUNTER — Encounter: Payer: Self-pay | Admitting: Family Medicine

## 2016-08-14 ENCOUNTER — Ambulatory Visit (INDEPENDENT_AMBULATORY_CARE_PROVIDER_SITE_OTHER): Payer: No Typology Code available for payment source | Admitting: Family Medicine

## 2016-08-14 VITALS — BP 144/90 | HR 75 | Temp 97.8°F | Resp 16 | Ht 65.0 in | Wt 227.0 lb

## 2016-08-14 DIAGNOSIS — L659 Nonscarring hair loss, unspecified: Secondary | ICD-10-CM

## 2016-08-14 MED ORDER — FLUOCINOLONE ACETONIDE SCALP 0.01 % EX OIL: TOPICAL_OIL | CUTANEOUS | 2 refills | Status: DC

## 2016-08-14 MED FILL — FLUOCINOLONE 0.01% SCALP OI: 0.01 | 20 days supply | Qty: 118 | Fill #0

## 2016-08-14 MED FILL — ?ESOMEPRAZOLE MAG DR 40 MG: 40 MG | 30 days supply | Qty: 30 | Fill #1

## 2016-08-14 NOTE — Progress Notes (Signed)
Patient ID: [name], female    DOB: Sep 30, 1975, 41 y.o.   MRN: [id number]  PCP: Joaquin Courts, FNP  Chief Complaint  Patient presents with  . Headache  . Alopecia    hurts to comb her hair    Subjective:  HPI  [name] is a 41 y.o. female presents for evaluation of scalp tenderness and alopecia. Reports over the last several months noticing that her hair is shedding more when she combs through it. She denies recent wearing of any heaving braids or glued in hair weaves. Reports that she uses the same beautician and no new hair products have been recently introduced to her hair. Denies hair thinning of pubic hair or legs. Denies any new life stressors that could precipitate  hair loss. Reports that she doesn't get much sleep at night and feels that this is likely the reason of her elevated blood pressure today. She has been told in the past that her blood pressure was elevated. Reports that she has eczema, intermittiently, and outbreaks are worst when her skin is excessively dry. Reports that her mother and sister both suffer from severe hair thinning in the front of their heads.   Social History   Social History  . Marital status: Divorced    Spouse name: N/A  . Number of children: 2  . Years of education: 12th   Occupational History  .  Other    [employer]   Social History Main Topics  . Smoking status: Never Smoker  . Smokeless tobacco: Never Used  . Alcohol use No  . Drug use: No  . Sexual activity: Not on file   Other Topics Concern  . Not on file   Social History Narrative   Patient lives at home with family.   Caffeine Use: 1 cup daily    Family History  Problem Relation Age of Onset  . Diabetes Mother   . Hypertension Mother   . Thyroid disease Sister    Review of Systems  SEE HPI  Patient Active Problem List   Diagnosis Date Noted  . Gastroesophageal reflux disease 06/27/2016  . Disturbance of skin sensation 04/27/2014  . Dizziness  and giddiness 03/14/2014  . Headache, migraine 03/14/2014  . Myalgia and myositis, unspecified 06/07/2013  . Nausea and vomiting in adult 08/22/2012  . Viral syndrome 08/22/2012  . Pelvic pain in female 12/18/2010  . PID (acute pelvic inflammatory disease) 12/18/2010  . ANEMIA-IRON DEFICIENCY 10/15/2006  . HERPES, GENITAL NOS 09/30/2006  . OBESITY NOS 09/30/2006  . DISORDER, NONORGANIC SLEEP NOS 09/30/2006  . CONSTIPATION NOS 09/30/2006  . NEURITIS, LUMBOSACRAL NOS 09/30/2006  . HX, PERSONAL, MENTAL DISORDER NOS 09/30/2006    Allergies  Allergen Reactions  . Other     Seasonal allergies     Prior to Admission medications   Medication Sig Start Date End Date Taking? Authorizing Provider  acetaminophen (TYLENOL) 500 MG tablet Take 1 tablet (500 mg total) by mouth every 6 (six) hours as needed for mild pain (pain). 07/24/16  Yes Massie Maroon, FNP  albuterol (PROVENTIL HFA;VENTOLIN HFA) 108 (90 Base) MCG/ACT inhaler Inhale 1-2 puffs into the lungs every 6 (six) hours as needed for wheezing or shortness of breath (cough). 05/11/16  Yes Riki Sheer, PA-C  azithromycin (ZITHROMAX) 250 MG tablet Take 500 mg, days 2-5 250 mg 07/24/16  Yes Massie Maroon, FNP  cetirizine (ZYRTEC) 10 MG tablet Take 1 tablet (10 mg total) by mouth daily. 07/24/16  Yes Lachina M Hollis, FNP  docusate sodium (COLACE) 100 MG capsule Take 100 mg by mouth 2 (two) times daily as needed for moderate constipation.    Yes Historical Provider, MD  esomeprazole (NEXIUM) 40 MG capsule Take 1 capsule (40 mg total) by mouth every morning. 06/26/16  Yes Massie Maroon, FNP  ondansetron (ZOFRAN) 4 MG tablet Take 1 tablet (4 mg total) by mouth every 8 (eight) hours as needed for nausea or vomiting. 06/26/16  Yes Massie Maroon, FNP  promethazine (PHENERGAN) 25 MG tablet Take 1 tablet (25 mg total) by mouth every 6 (six) hours as needed for nausea or vomiting. 03/18/16  Yes Renne Crigler, PA-C  valACYclovir (VALTREX) 500  MG tablet Take 1 tablet (500 mg total) by mouth 2 (two) times daily as needed (outbreaks). 12/06/15  Yes Henrietta Hoover, NP    Past Medical, Surgical Family and Social History reviewed and updated.    Objective:   Today's Vitals   08/14/16 1128  BP: (!) 148/90  Pulse: 75  Resp: 16  Temp: 97.8 F (36.6 C)  TempSrc: Oral  SpO2: 100%  Weight: 227 lb (103 kg)  Height:  (1.651 m)    Wt Readings from Last 3 Encounters:  08/14/16 227 lb (103 kg)  07/24/16 215 lb (97.5 kg)  06/26/16 214 lb (97.1 kg)    Physical Exam  Constitutional: She is oriented to person, place, and time. She appears well-developed and well-nourished.  HENT:  Head:    Neck: Normal range of motion. Neck supple. No thyromegaly present.  Cardiovascular: Normal rate, regular rhythm, normal heart sounds and intact distal pulses.   Neurological: She is alert and oriented to person, place, and time. She has normal reflexes.  Skin: Skin is warm and dry.  Psychiatric: She has a normal mood and affect. Her behavior is normal. Judgment and thought content normal.    Assessment & Plan:  1. Alopecia - Thyroid Panel With TSH if normal, will consider a referral to dermatology -Ambulatory Referral Dermatology  -Apply Fluocinolone Acetonide Scalp 0.01 % OIL to affected areas of scalp.   2. Elevated Blood Pressure -Return in 4 weeks for blood pressure check -Periodically check your blood pressure and if continually greater than 140/90, I will need to see you sooner and start you on antihypertension medication.  RTC: 4 WEEKS  Shalom Mcguiness S. Tiburcio Pea, MSN, [facility] [address], [place] [address] [phone]

## 2016-08-14 NOTE — Patient Instructions (Signed)
Return in 4 weeks for a blood pressure recheck. periodically check your blood pressure at a retail store [facility] or [facility] and keep a long of your readings.  If your blood pressure, is continually greater than 140/90, we will need to start you on blood pressure medication sooner than one month. Increase physical activity and avoid table salt are two ways to improve blood pressure.  I am ordering you a steroid based oil to apply directly to your scalp to promote hair growth. Avoid heat, chemicals, or placing hair in ponytails or in any motion that causes tension on the scalp.  Keep scalp moistened with natural based oils applied to hair.    Hypertension Hypertension is another name for high blood pressure. High blood pressure forces your heart to work harder to pump blood. This can cause problems over time. There are two numbers in a blood pressure reading. There is a top number (systolic) over a bottom number (diastolic). It is best to have a blood pressure below 120/80. Healthy choices can help lower your blood pressure. You may need medicine to help lower your blood pressure if:  Your blood pressure cannot be lowered with healthy choices.  Your blood pressure is higher than 130/80. Follow these instructions at home: Eating and drinking   If directed, follow the DASH eating plan. This diet includes:  Filling half of your plate at each meal with fruits and vegetables.  Filling one quarter of your plate at each meal with whole grains. Whole grains include whole wheat pasta, brown rice, and whole grain bread.  Eating or drinking low-fat dairy products, such as skim milk or low-fat yogurt.  Filling one quarter of your plate at each meal with low-fat (lean) proteins. Low-fat proteins include fish, skinless chicken, eggs, beans, and tofu.  Avoiding fatty meat, cured and processed meat, or chicken with skin.  Avoiding premade or processed food.  Eat less than 1,500 mg of salt (sodium) a  day.  Limit alcohol use to no more than 1 drink a day for nonpregnant women and 2 drinks a day for men. One drink equals 12 oz of beer, 5 oz of wine, or 1 oz of hard liquor. Lifestyle   Work with your doctor to stay at a healthy weight or to lose weight. Ask your doctor what the best weight is for you.  Get at least 30 minutes of exercise that causes your heart to beat faster (aerobic exercise) most days of the week. This may include walking, swimming, or biking.  Get at least 30 minutes of exercise that strengthens your muscles (resistance exercise) at least 3 days a week. This may include lifting weights or pilates.  Do not use any products that contain nicotine or tobacco. This includes cigarettes and e-cigarettes. If you need help quitting, ask your doctor.  Check your blood pressure at home as told by your doctor.  Keep all follow-up visits as told by your doctor. This is important. Medicines   Take over-the-counter and prescription medicines only as told by your doctor. Follow directions carefully.  Do not skip doses of blood pressure medicine. The medicine does not work as well if you skip doses. Skipping doses also puts you at risk for problems.  Ask your doctor about side effects or reactions to medicines that you should watch for. Contact a doctor if:  You think you are having a reaction to the medicine you are taking.  You have headaches that keep coming back (recurring).  You feel  dizzy.  You have swelling in your ankles.  You have trouble with your vision. Get help right away if:  You get a very bad headache.  You start to feel confused.  You feel weak or numb.  You feel faint.  You get very bad pain in your:  Chest.  Belly (abdomen).  You throw up (vomit) more than once.  You have trouble breathing. Summary  Hypertension is another name for high blood pressure.  Making healthy choices can help lower blood pressure. If your blood pressure cannot  be controlled with healthy choices, you may need to take medicine. This information is not intended to replace advice given to you by your health care provider. Make sure you discuss any questions you have with your health care provider. Document Released: 10/09/2007 Document Revised: 03/20/2016 Document Reviewed: 03/20/2016 Elsevier Interactive Patient Education  2017 [employer].

## 2016-08-15 ENCOUNTER — Ambulatory Visit: Payer: No Typology Code available for payment source | Admitting: Family Medicine

## 2016-08-15 VITALS — BP 146/86

## 2016-08-15 DIAGNOSIS — Z0130 Encounter for examination of blood pressure without abnormal findings: Secondary | ICD-10-CM

## 2016-08-16 ENCOUNTER — Other Ambulatory Visit (INDEPENDENT_AMBULATORY_CARE_PROVIDER_SITE_OTHER): Payer: No Typology Code available for payment source

## 2016-08-16 DIAGNOSIS — L659 Nonscarring hair loss, unspecified: Secondary | ICD-10-CM

## 2016-08-16 NOTE — Progress Notes (Unsigned)
TSH panel

## 2016-08-17 LAB — THYROID PANEL WITH TSH
Free Thyroxine Index: 2.3 (ref 1.4–3.8)
T3 Uptake: 39 % — ABNORMAL HIGH (ref 22–35)
T4, Total: 6 ug/dL (ref 4.5–12.0)
TSH: 0.95 mIU/L

## 2016-08-18 ENCOUNTER — Encounter (HOSPITAL_COMMUNITY): Payer: Self-pay | Admitting: Emergency Medicine

## 2016-08-18 ENCOUNTER — Emergency Department (HOSPITAL_COMMUNITY): Payer: Self-pay

## 2016-08-18 ENCOUNTER — Emergency Department (HOSPITAL_COMMUNITY)
Admission: EM | Admit: 2016-08-18 | Discharge: 2016-08-18 | Disposition: A | Discharge status: Home / Self Care | Payer: Self-pay | Attending: Emergency Medicine | Admitting: Emergency Medicine

## 2016-08-18 DIAGNOSIS — R531 Weakness: Secondary | ICD-10-CM

## 2016-08-18 DIAGNOSIS — D649 Anemia, unspecified: Secondary | ICD-10-CM | POA: Insufficient documentation

## 2016-08-18 DIAGNOSIS — G4700 Insomnia, unspecified: Secondary | ICD-10-CM | POA: Insufficient documentation

## 2016-08-18 DIAGNOSIS — Z79899 Other long term (current) drug therapy: Secondary | ICD-10-CM | POA: Insufficient documentation

## 2016-08-18 DIAGNOSIS — R0602 Shortness of breath: Secondary | ICD-10-CM | POA: Insufficient documentation

## 2016-08-18 LAB — BASIC METABOLIC PANEL
Anion gap: 7 (ref 5–15)
BUN: 7 mg/dL (ref 6–20)
CO2: 27 mmol/L (ref 22–32)
Calcium: 8.5 mg/dL — ABNORMAL LOW (ref 8.9–10.3)
Chloride: 103 mmol/L (ref 101–111)
Creatinine, Ser: 1.09 mg/dL — ABNORMAL HIGH (ref 0.44–1.00)
GFR calc Af Amer: >60 mL/min (ref >60)
GFR calc non Af Amer: >60 mL/min (ref >60)
Glucose, Bld: 93 mg/dL (ref 65–99)
Potassium: 4 mmol/L (ref 3.5–5.1)
Sodium: 137 mmol/L (ref 135–145)

## 2016-08-18 LAB — URINALYSIS, ROUTINE W REFLEX MICROSCOPIC
Bilirubin Urine: NEGATIVE
Glucose, UA: NEGATIVE mg/dL
Hgb urine dipstick: NEGATIVE
Ketones, ur: NEGATIVE mg/dL
Leukocytes, UA: NEGATIVE
Nitrite: NEGATIVE
Protein, ur: NEGATIVE mg/dL
Specific Gravity, Urine: 1.005 (ref 1.005–1.030)
pH: 6 (ref 5.0–8.0)

## 2016-08-18 LAB — CBC
HCT: 32.9 % — ABNORMAL LOW (ref 36.0–46.0)
Hemoglobin: 10.2 g/dL — ABNORMAL LOW (ref 12.0–15.0)
MCH: 22.4 pg — ABNORMAL LOW (ref 26.0–34.0)
MCHC: 31 g/dL (ref 30.0–36.0)
MCV: 72.3 fL — ABNORMAL LOW (ref 78.0–100.0)
Platelets: 303 10*3/uL (ref 150–400)
RBC: 4.55 MIL/uL (ref 3.87–5.11)
RDW: 15.8 % — ABNORMAL HIGH (ref 11.5–15.5)
WBC: 6.8 10*3/uL (ref 4.0–10.5)

## 2016-08-18 LAB — I-STAT TROPONIN, ED: Troponin i, poc: 0 ng/mL (ref 0.00–0.08)

## 2016-08-18 LAB — D-DIMER, QUANTITATIVE (NOT AT ARMC): D-Dimer, Quant: 0.3 ug/mL-FEU (ref 0.00–0.50)

## 2016-08-18 MED ORDER — ZOLPIDEM TARTRATE 5 MG PO TABS: 5.0000 mg | ORAL_TABLET | Freq: Every evening | ORAL | 0 refills | Status: DC | PRN

## 2016-08-18 NOTE — Addendum Note (Signed)
Addended by: Bing Neighbors on: 08/18/2016 12:28 AM   Modules accepted: Orders

## 2016-08-18 NOTE — ED Notes (Signed)
Report given to Gabe RN

## 2016-08-18 NOTE — ED Provider Notes (Signed)
[facility] DEPT Provider Note   CSN: [id number] Arrival date & time: 08/18/16  1255     History   Chief Complaint Chief Complaint  Patient presents with  . Hypertension  . Shortness of Breath  . Tachycardia  . Back Pain    HPI [name] is a 41 y.o. female.  HPI [name] is a 41 y.o. female presents to emergency department with complaint of shortness of breath, generalized weakness, insomnia. Patient states that she has been suffering from insomnia for a while. She states she gets on average 4 hours of sleep. She states that she has talked to her doctor about this and was told to try melatonin or Benadryl. Patient is a driver of a bus and states that she is unable to take Benadryl because it makes her groggy in the next day and melatonin has not helped. Patient also reports having multiple sleep apnea studies which were all negative. Patient is also complaining of shortness of breath, specifically at nighttime. She states last night she woke up 3 times gasping for air and was scared to go back to sleep. She denies any exertional shortness of breath. She does report some chest tightness, however denies any chest pain. She states that she has had elevated blood pressure up to 140 systolic over the last few weeks and has seen her primary care doctor for the same last week. She states that she has also developed right lower back pain 2 days ago which is still persistent. Denies any injuries. She states yesterday she had right calf pain which now resolved. She denies any other associated symptoms at this time.  Past Medical History:  Diagnosis Date  . Anemia   . Constipation   . GERD (gastroesophageal reflux disease)   . Hx of endometriosis   . Macromastia 07/2011  . Migraine     Patient Active Problem List   Diagnosis Date Noted  . Gastroesophageal reflux disease 06/27/2016  . Disturbance of skin sensation 04/27/2014  . Dizziness and giddiness 03/14/2014  .  Headache, migraine 03/14/2014  . Myalgia and myositis, unspecified 06/07/2013  . Nausea and vomiting in adult 08/22/2012  . Viral syndrome 08/22/2012  . Pelvic pain in female 12/18/2010  . PID (acute pelvic inflammatory disease) 12/18/2010  . ANEMIA-IRON DEFICIENCY 10/15/2006  . HERPES, GENITAL NOS 09/30/2006  . OBESITY NOS 09/30/2006  . DISORDER, NONORGANIC SLEEP NOS 09/30/2006  . CONSTIPATION NOS 09/30/2006  . NEURITIS, LUMBOSACRAL NOS 09/30/2006  . HX, PERSONAL, MENTAL DISORDER NOS 09/30/2006    Past Surgical History:  Procedure Laterality Date  . ABDOMINAL HYSTERECTOMY  12/12/2003  . ADENOIDECTOMY  05/2011  . ANKLE ARTHROSCOPY  12/29/2007   right; with extensive debridement  . BLADDER NECK RECONSTRUCTION  01/14/2011   Procedure: BLADDER NECK REPAIR;  Surgeon: Reva Bores, MD;  Location: [facility];  Service: Gynecology;  Laterality: N/A;  Laparoscopic Repair of Incidental Cystotomy  . BREAST REDUCTION SURGERY  08/05/2011   Procedure: MAMMARY REDUCTION  (BREAST);  Surgeon: Louisa Second, MD;  Location: [facility];  Service: Plastics;  Laterality: Bilateral;  bilateral  . CESAREAN SECTION  12/29/2000; 1994  . DILATION AND CURETTAGE OF UTERUS  07/08/2003   open laparoscopy  . LAPAROSCOPIC SALPINGOOPHERECTOMY  01/14/2011   left  . REPAIR PERONEAL TENDONS ANKLE  01/03/2010   repair right subluxing peroneal tendons  . RIGHT OOPHORECTOMY     with lysis of adhesions  . SALPINGECTOMY  12/12/2003   right; with TAH  .  TONSILLECTOMY  as a child  . TUBAL LIGATION  12/29/2000    OB History    Gravida Para Term Preterm AB Living   SAB TAB Ectopic Multiple Live Births                   Home Medications    Prior to Admission medications   Medication Sig Start Date End Date Taking? Authorizing Provider  acetaminophen (TYLENOL) 500 MG tablet Take 1 tablet (500 mg total) by mouth every 6 (six) hours as needed for mild pain (pain). 07/24/16   Massie Maroon, FNP    albuterol (PROVENTIL HFA;VENTOLIN HFA) 108 (90 Base) MCG/ACT inhaler Inhale 1-2 puffs into the lungs every 6 (six) hours as needed for wheezing or shortness of breath (cough). 05/11/16   Riki Sheer, PA-C  azithromycin (ZITHROMAX) 250 MG tablet Take 500 mg, days 2-5 250 mg 07/24/16   Massie Maroon, FNP  cetirizine (ZYRTEC) 10 MG tablet Take 1 tablet (10 mg total) by mouth daily. 07/24/16   Massie Maroon, FNP  docusate sodium (COLACE) 100 MG capsule Take 100 mg by mouth 2 (two) times daily as needed for moderate constipation.     Historical Provider, MD  esomeprazole (NEXIUM) 40 MG capsule Take 1 capsule (40 mg total) by mouth every morning. 06/26/16   Massie Maroon, FNP  Fluocinolone Acetonide Scalp 0.01 % OIL Apply oil to affected areas twice daily until hair growth appears 08/14/16   Doyle Askew, FNP  ondansetron (ZOFRAN) 4 MG tablet Take 1 tablet (4 mg total) by mouth every 8 (eight) hours as needed for nausea or vomiting. 06/26/16   Massie Maroon, FNP  promethazine (PHENERGAN) 25 MG tablet Take 1 tablet (25 mg total) by mouth every 6 (six) hours as needed for nausea or vomiting. 03/18/16   Renne Crigler, PA-C  valACYclovir (VALTREX) 500 MG tablet Take 1 tablet (500 mg total) by mouth 2 (two) times daily as needed (outbreaks). 12/06/15   Henrietta Hoover, NP    Family History Family History  Problem Relation Age of Onset  . Diabetes Mother   . Hypertension Mother   . Thyroid disease Sister     Social History Social History  Substance Use Topics  . Smoking status: Never Smoker  . Smokeless tobacco: Never Used  . Alcohol use No     Allergies   Other   Review of Systems Review of Systems  Constitutional: Positive for fatigue. Negative for chills and fever.  HENT: Negative for congestion.   Respiratory: Positive for chest tightness and shortness of breath. Negative for cough.   Cardiovascular: Negative for chest pain, palpitations and leg swelling.   Gastrointestinal: Positive for nausea. Negative for abdominal pain, diarrhea and vomiting.  Genitourinary: Negative for dysuria, flank pain and pelvic pain.  Musculoskeletal: Positive for back pain. Negative for arthralgias, myalgias, neck pain and neck stiffness.  Skin: Negative for rash.  Neurological: Positive for dizziness, weakness and light-headedness. Negative for headaches.  All other systems reviewed and are negative.    Physical Exam Updated Vital Signs BP 135/85   Pulse 88   Temp 98.4 F (36.9 C) (Oral)   Resp 19   Ht  (1.651 m)   Wt 97.5 kg   LMP 12/27/2003   SpO2 100%   BMI 35.78 kg/m   Physical Exam  Constitutional: She is oriented to person, place, and time. She appears well-developed  and well-nourished. No distress.  HENT:  Head: Normocephalic.  Eyes: Conjunctivae are normal.  Neck: Neck supple.  Cardiovascular: Normal rate, regular rhythm and normal heart sounds.   Pulmonary/Chest: Effort normal and breath sounds normal. No respiratory distress. She has no wheezes. She has no rales.  Abdominal: Soft. Bowel sounds are normal. She exhibits no distension. There is no tenderness. There is no rebound.  Musculoskeletal: She exhibits no edema.  Neurological: She is alert and oriented to person, place, and time.  Skin: Skin is warm and dry.  Psychiatric: She has a normal mood and affect. Her behavior is normal.  Nursing note and vitals reviewed.    ED Treatments / Results  Labs (all labs ordered are listed, but only abnormal results are displayed) Labs Reviewed  BASIC METABOLIC PANEL - Abnormal; Notable for the following:       Result Value   Creatinine, Ser 1.09 (*)    Calcium 8.5 (*)    All other components within normal limits  CBC - Abnormal; Notable for the following:    Hemoglobin 10.2 (*)    HCT 32.9 (*)    MCV 72.3 (*)    MCH 22.4 (*)    RDW 15.8 (*)    All other components within normal limits  URINALYSIS, ROUTINE W REFLEX MICROSCOPIC -  Abnormal; Notable for the following:    Color, Urine STRAW (*)    All other components within normal limits  D-DIMER, QUANTITATIVE (NOT AT [facility])  I-STAT TROPOININ, ED    EKG  EKG Interpretation  Date/Time:  Sunday August 18 2016 13:18:08 EDT Ventricular Rate:  96 PR Interval:  140 QRS Duration: 88 QT Interval:  356 QTC Calculation: 449 R Axis:   77 Text Interpretation:  Normal sinus rhythm Normal ECG Confirmed by RAY MD, Duwayne Heck [phone]) on 08/18/2016 3:28:59 PM       Radiology Dg Chest 2 View  Result Date: 08/18/2016 CLINICAL DATA:  Shortness of breath and chest pain for 1 day. EXAM: CHEST  2 VIEW COMPARISON:  05/11/2016 and prior radiographs FINDINGS: The cardiomediastinal silhouette is unremarkable. There is no evidence of focal airspace disease, pulmonary edema, suspicious pulmonary nodule/mass, pleural effusion, or pneumothorax. No acute bony abnormalities are identified. IMPRESSION: No active cardiopulmonary disease. Electronically Signed   By: Harmon Pier M.D.   On: 08/18/2016 14:56    Procedures Procedures (including critical care time)  Medications Ordered in ED Medications - No data to display   Initial Impression / Assessment and Plan / ED Course  I have reviewed the triage vital signs and the nursing notes.  Pertinent labs & imaging results that were available during my care of the patient were reviewed by me and considered in my medical decision making (see chart for details).     Patient emergency department generalized weakness, shortness of breath, unable to sleep. Sounds like sleeping issue is chronic. She states main reason she is here is because waking up in the middle of the night gasping for air and not feeling well overall. We will check labs, chest x-ray, will add a d-dimer. Will monitor. Patient's vital signs are normal at this time, she is in no acute distress.  3:20 PM Patient's lab work shows slightly low calcium at 8.5, anemia, hemoglobin 10.2,  otherwise blood work is unremarkable. Urinalysis negative. Troponin and d-dimer are negative. Chest x-ray is clear. I suspect patient's fatigue could be attributed to her not sleeping. I will give her prescription for a few Ambien to help  her get some rest and see if her symptoms would improve. I instructed her to follow-up with a family doctor for further evaluation. Discussed plan, she agreed. All questions answered. Return precautions discussed. Vital signs are all within normal at time of discharge. Patient is in no acute distress.  Vitals:   08/18/16 1349 08/18/16 1400 08/18/16 1415 08/18/16 1500  BP:  139/89 112/86 123/83  Pulse: 88 96 89 72  Resp: 19 19 (!) 23 13  Temp:      TempSrc:      SpO2: 100% 100% 99% 100%  Weight:      Height:         Final Clinical Impressions(s) / ED Diagnoses   Final diagnoses:  Weakness  Insomnia, unspecified type  Shortness of breath  Anemia, unspecified type    New Prescriptions New Prescriptions   ZOLPIDEM (AMBIEN) 5 MG TABLET    Take 1 tablet (5 mg total) by mouth at bedtime as needed for sleep.     Jaynie Crumble, PA-C 08/18/16 1603    Margarita Grizzle, MD 08/22/16 1400

## 2016-08-18 NOTE — Discharge Instructions (Signed)
Try ambien to help you sleep. Make sure to get 7-8 hrs of sleep each night. Follow up with family doctor for further evaluation and treatment.

## 2016-08-18 NOTE — ED Notes (Signed)
Patient ambulated to restroom with stead gait.

## 2016-08-18 NOTE — ED Triage Notes (Signed)
Pt c/o high blood pressure, shortness of breath, racing heart rate and back pain ongoing for 2 weeks. Pt has been seen by PMD for same. Pt reports that she jumps up out her sleep feeling short of breath and having left arm numbness.

## 2016-09-13 ENCOUNTER — Ambulatory Visit: Payer: No Typology Code available for payment source | Admitting: Family Medicine

## 2016-09-13 VITALS — BP 124/82

## 2016-09-13 DIAGNOSIS — Z0130 Encounter for examination of blood pressure without abnormal findings: Secondary | ICD-10-CM

## 2016-09-16 MED FILL — ?ESOMEPRAZOLE MAG DR 40 MG: 40 MG | 30 days supply | Qty: 30 | Fill #2

## 2016-10-09 MED FILL — ?CETIRIZINE HCL 10 MG TABLE: 10 | 30 days supply | Qty: 30 | Fill #1

## 2016-11-08 ENCOUNTER — Encounter (HOSPITAL_COMMUNITY): Payer: Self-pay | Admitting: Emergency Medicine

## 2016-11-08 ENCOUNTER — Emergency Department (HOSPITAL_COMMUNITY)
Admission: EM | Admit: 2016-11-08 | Discharge: 2016-11-09 | Disposition: A | Discharge status: Home / Self Care | Payer: No Typology Code available for payment source | Attending: Emergency Medicine | Admitting: Emergency Medicine

## 2016-11-08 DIAGNOSIS — N390 Urinary tract infection, site not specified: Secondary | ICD-10-CM | POA: Insufficient documentation

## 2016-11-08 LAB — LIPASE, BLOOD: Lipase: 25 U/L (ref 11–51)

## 2016-11-08 LAB — COMPREHENSIVE METABOLIC PANEL
ALT: 16 U/L (ref 14–54)
AST: 24 U/L (ref 15–41)
Albumin: 4.1 g/dL (ref 3.5–5.0)
Alkaline Phosphatase: 73 U/L (ref 38–126)
Anion gap: 8 (ref 5–15)
BUN: <5 mg/dL — ABNORMAL LOW (ref 6–20)
CO2: 24 mmol/L (ref 22–32)
Calcium: 9.1 mg/dL (ref 8.9–10.3)
Chloride: 105 mmol/L (ref 101–111)
Creatinine, Ser: 0.87 mg/dL (ref 0.44–1.00)
GFR calc Af Amer: >60 mL/min (ref >60)
GFR calc non Af Amer: >60 mL/min (ref >60)
Glucose, Bld: 107 mg/dL — ABNORMAL HIGH (ref 65–99)
Potassium: 3.4 mmol/L — ABNORMAL LOW (ref 3.5–5.1)
Sodium: 137 mmol/L (ref 135–145)
Total Bilirubin: 0.6 mg/dL (ref 0.3–1.2)
Total Protein: 7.3 g/dL (ref 6.5–8.1)

## 2016-11-08 LAB — CBC
HCT: 33.9 % — ABNORMAL LOW (ref 36.0–46.0)
Hemoglobin: 10.6 g/dL — ABNORMAL LOW (ref 12.0–15.0)
MCH: 22.8 pg — ABNORMAL LOW (ref 26.0–34.0)
MCHC: 31.3 g/dL (ref 30.0–36.0)
MCV: 72.9 fL — ABNORMAL LOW (ref 78.0–100.0)
Platelets: 321 10*3/uL (ref 150–400)
RBC: 4.65 MIL/uL (ref 3.87–5.11)
RDW: 16.9 % — ABNORMAL HIGH (ref 11.5–15.5)
WBC: 6.3 10*3/uL (ref 4.0–10.5)

## 2016-11-08 LAB — URINALYSIS, ROUTINE W REFLEX MICROSCOPIC
Bilirubin Urine: NEGATIVE
Glucose, UA: NEGATIVE mg/dL
Hgb urine dipstick: NEGATIVE
Ketones, ur: NEGATIVE mg/dL
Nitrite: NEGATIVE
Protein, ur: NEGATIVE mg/dL
RBC / HPF: NONE SEEN RBC/hpf (ref 0–5)
Specific Gravity, Urine: 1.006 (ref 1.005–1.030)
pH: 6 (ref 5.0–8.0)

## 2016-11-08 MED ORDER — GI COCKTAIL ~~LOC~~
30.0000 mL | Freq: Once | ORAL | Status: AC
  Administered 2016-11-08: 30 mL via ORAL
  Filled 2016-11-08: qty 30

## 2016-11-08 MED FILL — VALACYCLOVIR HCL 500 MG TAB: 500 | 15 days supply | Qty: 30 | Fill #1

## 2016-11-08 MED FILL — ?ESOMEPRAZOLE MAG DR 40 MG: 40 MG | 30 days supply | Qty: 30 | Fill #3

## 2016-11-08 NOTE — ED Triage Notes (Signed)
Pt c/o mid abd pain onset over a week ago, pt is having increasing "bladder issues" with urgency an incontinence. +nausea, "pelvic pain", R flank pain onset 5 days ago.

## 2016-11-08 NOTE — ED Provider Notes (Signed)
[facility] Provider Note   CSN: [id number] Arrival date & time: 11/08/16  2238     History   Chief Complaint Chief Complaint  Patient presents with  . Abdominal Pain    HPI [name] is a 41 y.o. female.  HPI   41 year old female with history of constipation, endometriosis, GERD presenting complaining of abdominal and back pain. Patient states she has a history of bladder leakage ongoing for several years, usually wearing pads however for the past 2 weeks she has noticed increasing urinary urgency with increasing leakage. Furthermore she is now experiencing persistent crampy abdominal pain, pelvic pain and back pain of moderate intensity, nothing seems to make it better or worse. She endorse nausea without vomiting or diarrhea. She denies fever, chills, lightheadedness, dizziness, headache, chest pain, shortness of breath, productive cough, hematuria, vaginal bleeding or vaginal discharge. She denies any postprandial pain. Aside from rest, she denies any other specific treatment tried. History of prior abdominal hysterectomy.  Past Medical History:  Diagnosis Date  . Anemia   . Constipation   . GERD (gastroesophageal reflux disease)   . Hx of endometriosis   . Macromastia 07/2011  . Migraine     Patient Active Problem List   Diagnosis Date Noted  . Gastroesophageal reflux disease 06/27/2016  . Disturbance of skin sensation 04/27/2014  . Dizziness and giddiness 03/14/2014  . Headache, migraine 03/14/2014  . Myalgia and myositis, unspecified 06/07/2013  . Nausea and vomiting in adult 08/22/2012  . Viral syndrome 08/22/2012  . Pelvic pain in female 12/18/2010  . PID (acute pelvic inflammatory disease) 12/18/2010  . ANEMIA-IRON DEFICIENCY 10/15/2006  . HERPES, GENITAL NOS 09/30/2006  . OBESITY NOS 09/30/2006  . DISORDER, NONORGANIC SLEEP NOS 09/30/2006  . CONSTIPATION NOS 09/30/2006  . NEURITIS, LUMBOSACRAL NOS 09/30/2006  . HX, PERSONAL, MENTAL DISORDER NOS  09/30/2006    Past Surgical History:  Procedure Laterality Date  . ABDOMINAL HYSTERECTOMY  12/12/2003  . ADENOIDECTOMY  05/2011  . ANKLE ARTHROSCOPY  12/29/2007   right; with extensive debridement  . BLADDER NECK RECONSTRUCTION  01/14/2011   Procedure: BLADDER NECK REPAIR;  Surgeon: Reva Boresanya S Pratt, MD;  Location: [facility];  Service: Gynecology;  Laterality: N/A;  Laparoscopic Repair of Incidental Cystotomy  . BREAST REDUCTION SURGERY  08/05/2011   Procedure: MAMMARY REDUCTION  (BREAST);  Surgeon: Louisa SecondGerald Truesdale, MD;  Location: [facility];  Service: Plastics;  Laterality: Bilateral;  bilateral  . CESAREAN SECTION  12/29/2000; 1994  . DILATION AND CURETTAGE OF UTERUS  07/08/2003   open laparoscopy  . LAPAROSCOPIC SALPINGOOPHERECTOMY  01/14/2011   left  . REPAIR PERONEAL TENDONS ANKLE  01/03/2010   repair right subluxing peroneal tendons  . RIGHT OOPHORECTOMY     with lysis of adhesions  . SALPINGECTOMY  12/12/2003   right; with TAH  . TONSILLECTOMY  as a child  . TUBAL LIGATION  12/29/2000    OB History    Gravida Para Term Preterm AB Living   2 2 2     2    SAB TAB Ectopic Multiple Live Births                   Home Medications    Prior to Admission medications   Medication Sig Start Date End Date Taking? Authorizing Provider  acetaminophen (TYLENOL) 500 MG tablet Take 1 tablet (500 mg total) by mouth every 6 (six) hours as needed for mild pain (pain). 07/24/16   Massie MaroonHollis, Lachina M, FNP  albuterol (PROVENTIL HFA;VENTOLIN HFA) 108 (90 Base) MCG/ACT inhaler Inhale 1-2 puffs into the lungs every 6 (six) hours as needed for wheezing or shortness of breath (cough). 05/11/16   Riki Sheer, PA-C  azithromycin (ZITHROMAX) 250 MG tablet Take 500 mg, days 2-5 250 mg 07/24/16   Massie Maroon, FNP  cetirizine (ZYRTEC) 10 MG tablet Take 1 tablet (10 mg total) by mouth daily. 07/24/16   Massie Maroon, FNP  docusate sodium (COLACE) 100 MG capsule Take 100 mg by mouth 2 (two) times  daily as needed for moderate constipation.     [provider]  esomeprazole (NEXIUM) 40 MG capsule Take 1 capsule (40 mg total) by mouth every morning. 06/26/16   Massie Maroon, FNP  Fluocinolone Acetonide Scalp 0.01 % OIL Apply oil to affected areas twice daily until hair growth appears 08/14/16   Bing Neighbors, FNP  ondansetron (ZOFRAN) 4 MG tablet Take 1 tablet (4 mg total) by mouth every 8 (eight) hours as needed for nausea or vomiting. 06/26/16   Massie Maroon, FNP  promethazine (PHENERGAN) 25 MG tablet Take 1 tablet (25 mg total) by mouth every 6 (six) hours as needed for nausea or vomiting. 03/18/16   Renne Crigler, PA-C  valACYclovir (VALTREX) 500 MG tablet Take 1 tablet (500 mg total) by mouth 2 (two) times daily as needed (outbreaks). 12/06/15   Henrietta Hoover, NP  zolpidem (AMBIEN) 5 MG tablet Take 1 tablet (5 mg total) by mouth at bedtime as needed for sleep. 08/18/16   Jaynie Crumble, PA-C    Family History Family History  Problem Relation Age of Onset  . Diabetes Mother   . Hypertension Mother   . Thyroid disease Sister     Social History Social History  Substance Use Topics  . Smoking status: Never Smoker  . Smokeless tobacco: Never Used  . Alcohol use No     Allergies   Other   Review of Systems Review of Systems  All other systems reviewed and are negative.    Physical Exam Updated Vital Signs BP (!) 148/98 (BP Location: Left Arm)   Pulse 94   Temp 98.2 F (36.8 C) (Oral)   Resp 18   Ht 5\' 4"  (1.626 m)   Wt 95.3 kg (210 lb)   LMP 12/27/2003   SpO2 100%   BMI 36.05 kg/m   Physical Exam  Constitutional: She appears well-developed and well-nourished. No distress.  HENT:  Head: Atraumatic.  Eyes: Conjunctivae are normal.  Neck: Neck supple.  Cardiovascular: Normal rate and regular rhythm.   Pulmonary/Chest: Effort normal and breath sounds normal.  Abdominal: Soft. Bowel sounds are normal. She exhibits no distension.  There is tenderness (Mild epigastric tenderness without guarding or rebound tenderness.). There is no guarding.  Genitourinary:  Genitourinary Comments: No CVA tenderness  Neurological: She is alert.  Skin: No rash noted.  Psychiatric: She has a normal mood and affect.  Nursing note and vitals reviewed.    ED Treatments / Results  Labs (all labs ordered are listed, but only abnormal results are displayed) Labs Reviewed  COMPREHENSIVE METABOLIC PANEL - Abnormal; Notable for the following:       Result Value   Potassium 3.4 (*)    Glucose, Bld 107 (*)    BUN <5 (*)    All other components within normal limits  CBC - Abnormal; Notable for the following:    Hemoglobin 10.6 (*)    HCT 33.9 (*)  MCV 72.9 (*)    MCH 22.8 (*)    RDW 16.9 (*)    All other components within normal limits  URINALYSIS, ROUTINE W REFLEX MICROSCOPIC - Abnormal; Notable for the following:    Leukocytes, UA TRACE (*)    Bacteria, UA MANY (*)    Squamous Epithelial / LPF 0-5 (*)    All other components within normal limits  URINE CULTURE  LIPASE, BLOOD    EKG  EKG Interpretation None       Radiology No results found.  Procedures Procedures (including critical care time)  Medications Ordered in ED Medications  gi cocktail (Maalox,Lidocaine,Donnatal) (not administered)     Initial Impression / Assessment and Plan / ED Course  I have reviewed the triage vital signs and the nursing notes.  Pertinent labs & imaging results that were available during my care of the patient were reviewed by me and considered in my medical decision making (see chart for details).     BP 126/85   Pulse 80   Temp 98.2 F (36.8 C) (Oral)   Resp 18   Ht 5\' 4"  (1.626 m)   Wt 95.3 kg (210 lb)   LMP 12/27/2003   SpO2 100%   BMI 36.05 kg/m    Final Clinical Impressions(s) / ED Diagnoses   Final diagnoses:  Lower urinary tract infectious disease    New Prescriptions New Prescriptions   CEPHALEXIN  (KEFLEX) 500 MG CAPSULE    Take 1 capsule (500 mg total) by mouth 3 (three) times daily.   IBUPROFEN (ADVIL,MOTRIN) 600 MG TABLET    Take 1 tablet (600 mg total) by mouth every 6 (six) hours as needed.   11:19 PM Patient here with vague abdominal and back pain. Also report having urinary frequency and increased bladder stress incontinence. She does have some mild epigastric tenderness but no peritoneal sign on exam. She denies being sexually active therefore I have low suspicion for pelvic inflammatory disease causing her symptoms.  12:18 AM UA shows trace leukocytes, 6-30 WBC and many bacteria.  Since pt has some urinary sxs I plan to treat for potential UTI with Keflex.  Urine culture sent.  Her labs are otherwise reassuring.     Fayrene Helper, PA-C 11/09/16 Abby Potash, MD 11/09/16 [phone]

## 2016-11-09 MED ORDER — CEPHALEXIN 500 MG PO CAPS: 500.0000 mg | ORAL_CAPSULE | Freq: Three times a day (TID) | ORAL | 0 refills | Status: DC

## 2016-11-09 MED ORDER — IBUPROFEN 600 MG PO TABS: 600.0000 mg | ORAL_TABLET | Freq: Four times a day (QID) | ORAL | 0 refills | Status: DC | PRN

## 2016-11-10 LAB — URINE CULTURE

## 2016-11-15 ENCOUNTER — Emergency Department (HOSPITAL_COMMUNITY)
Admission: EM | Admit: 2016-11-15 | Discharge: 2016-11-16 | Disposition: A | Discharge status: Home / Self Care | Payer: No Typology Code available for payment source | Attending: Emergency Medicine | Admitting: Emergency Medicine

## 2016-11-15 ENCOUNTER — Encounter (HOSPITAL_COMMUNITY): Payer: Self-pay | Admitting: Emergency Medicine

## 2016-11-15 DIAGNOSIS — Y929 Unspecified place or not applicable: Secondary | ICD-10-CM | POA: Insufficient documentation

## 2016-11-15 DIAGNOSIS — S39012A Strain of muscle, fascia and tendon of lower back, initial encounter: Secondary | ICD-10-CM

## 2016-11-15 DIAGNOSIS — Y9389 Activity, other specified: Secondary | ICD-10-CM | POA: Insufficient documentation

## 2016-11-15 DIAGNOSIS — Y999 Unspecified external cause status: Secondary | ICD-10-CM | POA: Insufficient documentation

## 2016-11-15 DIAGNOSIS — W19XXXA Unspecified fall, initial encounter: Secondary | ICD-10-CM | POA: Insufficient documentation

## 2016-11-15 DIAGNOSIS — R300 Dysuria: Secondary | ICD-10-CM | POA: Insufficient documentation

## 2016-11-15 DIAGNOSIS — Z79899 Other long term (current) drug therapy: Secondary | ICD-10-CM | POA: Insufficient documentation

## 2016-11-15 DIAGNOSIS — D649 Anemia, unspecified: Secondary | ICD-10-CM | POA: Insufficient documentation

## 2016-11-15 LAB — URINALYSIS, ROUTINE W REFLEX MICROSCOPIC
Bilirubin Urine: NEGATIVE
Glucose, UA: NEGATIVE mg/dL
Hgb urine dipstick: NEGATIVE
Ketones, ur: NEGATIVE mg/dL
Leukocytes, UA: NEGATIVE
Nitrite: NEGATIVE
Protein, ur: NEGATIVE mg/dL
Specific Gravity, Urine: 1.003 — ABNORMAL LOW (ref 1.005–1.030)
pH: 7 (ref 5.0–8.0)

## 2016-11-15 LAB — WET PREP, GENITAL
Clue Cells Wet Prep HPF POC: NONE SEEN
Sperm: NONE SEEN
Trich, Wet Prep: NONE SEEN
Yeast Wet Prep HPF POC: NONE SEEN

## 2016-11-15 MED ORDER — AZITHROMYCIN 250 MG PO TABS
1000.0000 mg | ORAL_TABLET | Freq: Once | ORAL | Status: AC
  Administered 2016-11-15: 1000 mg via ORAL
  Filled 2016-11-15: qty 4

## 2016-11-15 MED ORDER — PHENAZOPYRIDINE HCL 100 MG PO TABS
200.0000 mg | ORAL_TABLET | Freq: Once | ORAL | Status: AC
  Administered 2016-11-15: 200 mg via ORAL
  Filled 2016-11-15: qty 2

## 2016-11-15 NOTE — ED Triage Notes (Signed)
Pt continues to experience lower back pain, dysuria, frequency, urgency; pt states was here x 1 wk ago and given rx for keflex; pt concerned she is not better

## 2016-11-15 NOTE — ED Provider Notes (Signed)
[facility]EMERGENCY DEPT Provider Note   CSN: [id number] Arrival date & time: 11/15/16  2154   By signing my name below, I, Soijett Blue, attest that this documentation has been prepared under the direction and in the presence of Kerrie BuffaloHope Ogechi Kuehnel, NP Electronically Signed: Soijett Blue, ED Scribe. 11/15/16. 11:47 PM.  History   Chief Complaint Chief Complaint  Patient presents with  . Back Pain  . Dysuria    HPI [name] is a 41 y.o. female who presents to the Emergency Department complaining of lower back pain onset 13 days ago. The back pain is on the right side and is worse with movement.  Pt reports associated urgency, urinary frequency, abdominal pain, and nausea. Pt has tried keflex Rx with no relief of her symptoms. She notes that she was evaluated in the ED for her symptoms and given keflex prescription with no relief of her symptoms. Denies being sexually active and notes that her last sexual intercourse being 4 months ago. Denies vaginal discharge, fever, chills, vomiting, and any other symptoms.    The history is provided by the patient. No language interpreter was used.  Back Pain   This is a new problem. The current episode started more than 1 week ago. The problem occurs rarely. The problem has not changed since onset.The pain is associated with no known injury. The pain is present in the lumbar spine. The pain does not radiate. The pain is mild. The pain is the same all the time. Associated symptoms include dysuria. Pertinent negatives include no chest pain, no fever, no headaches and no weakness. She has tried nothing for the symptoms. The treatment provided no relief.  Dysuria   This is a new problem. The current episode started more than 1 week ago. The problem occurs every urination. The problem has not changed since onset.The quality of the pain is described as burning. There has been no fever. She is not sexually active. There is no history of pyelonephritis. Associated  symptoms include nausea, frequency and urgency. Pertinent negatives include no chills, no vomiting and no discharge. Treatments tried: keflex Rx.    Past Medical History:  Diagnosis Date  . Anemia   . Constipation   . GERD (gastroesophageal reflux disease)   . Hx of endometriosis   . Macromastia 07/2011  . Migraine     Patient Active Problem List   Diagnosis Date Noted  . Gastroesophageal reflux disease 06/27/2016  . Disturbance of skin sensation 04/27/2014  . Dizziness and giddiness 03/14/2014  . Headache, migraine 03/14/2014  . Myalgia and myositis, unspecified 06/07/2013  . Nausea and vomiting in adult 08/22/2012  . Viral syndrome 08/22/2012  . Pelvic pain in female 12/18/2010  . PID (acute pelvic inflammatory disease) 12/18/2010  . ANEMIA-IRON DEFICIENCY 10/15/2006  . HERPES, GENITAL NOS 09/30/2006  . OBESITY NOS 09/30/2006  . DISORDER, NONORGANIC SLEEP NOS 09/30/2006  . CONSTIPATION NOS 09/30/2006  . NEURITIS, LUMBOSACRAL NOS 09/30/2006  . HX, PERSONAL, MENTAL DISORDER NOS 09/30/2006    Past Surgical History:  Procedure Laterality Date  . ABDOMINAL HYSTERECTOMY  12/12/2003  . ADENOIDECTOMY  05/2011  . ANKLE ARTHROSCOPY  12/29/2007   right; with extensive debridement  . BLADDER NECK RECONSTRUCTION  01/14/2011   Procedure: BLADDER NECK REPAIR;  Surgeon: Reva Boresanya S Pratt, MD;  Location: [facility];  Service: Gynecology;  Laterality: N/A;  Laparoscopic Repair of Incidental Cystotomy  . BREAST REDUCTION SURGERY  08/05/2011   Procedure: MAMMARY REDUCTION  (BREAST);  Surgeon: Louisa SecondGerald Truesdale, MD;  Location: [facility];  Service: Plastics;  Laterality: Bilateral;  bilateral  . CESAREAN SECTION  12/29/2000; 1994  . DILATION AND CURETTAGE OF UTERUS  07/08/2003   open laparoscopy  . LAPAROSCOPIC SALPINGOOPHERECTOMY  01/14/2011   left  . REPAIR PERONEAL TENDONS ANKLE  01/03/2010   repair right subluxing peroneal tendons  . RIGHT OOPHORECTOMY     with lysis of adhesions  .  SALPINGECTOMY  12/12/2003   right; with TAH  . TONSILLECTOMY  as a child  . TUBAL LIGATION  12/29/2000    OB History    Gravida Para Term Preterm AB Living   2 2 2     2    SAB TAB Ectopic Multiple Live Births                   Home Medications    Prior to Admission medications   Medication Sig Start Date End Date Taking? Authorizing Provider  acetaminophen (TYLENOL) 500 MG tablet Take 1 tablet (500 mg total) by mouth every 6 (six) hours as needed for mild pain (pain). 07/24/16   Massie Maroon, FNP  albuterol (PROVENTIL HFA;VENTOLIN HFA) 108 (90 Base) MCG/ACT inhaler Inhale 1-2 puffs into the lungs every 6 (six) hours as needed for wheezing or shortness of breath (cough). 05/11/16   Riki Sheer, PA-C  cephALEXin (KEFLEX) 500 MG capsule Take 1 capsule (500 mg total) by mouth 3 (three) times daily. 11/09/16   Fayrene Helper, PA-C  cetirizine (ZYRTEC) 10 MG tablet Take 1 tablet (10 mg total) by mouth daily. Patient taking differently: Take 10 mg by mouth daily as needed for allergies.  07/24/16   Massie Maroon, FNP  diclofenac (VOLTAREN) 50 MG EC tablet Take 1 tablet (50 mg total) by mouth 2 (two) times daily. 11/16/16   Janne Napoleon, NP  docusate sodium (COLACE) 100 MG capsule Take 100 mg by mouth 2 (two) times daily as needed for moderate constipation.     [provider]  esomeprazole (NEXIUM) 40 MG capsule Take 1 capsule (40 mg total) by mouth every morning. 06/26/16   Massie Maroon, FNP  ibuprofen (ADVIL,MOTRIN) 600 MG tablet Take 1 tablet (600 mg total) by mouth every 6 (six) hours as needed. 11/09/16   Fayrene Helper, PA-C  methocarbamol (ROBAXIN) 500 MG tablet Take 1 tablet (500 mg total) by mouth 2 (two) times daily. 11/16/16   Janne Napoleon, NP  ondansetron (ZOFRAN) 4 MG tablet Take 1 tablet (4 mg total) by mouth every 8 (eight) hours as needed for nausea or vomiting. 06/26/16   Massie Maroon, FNP  phenazopyridine (PYRIDIUM) 200 MG tablet Take 1 tablet (200 mg total) by  mouth 3 (three) times daily. 11/16/16   Janne Napoleon, NP  promethazine (PHENERGAN) 25 MG tablet Take 1 tablet (25 mg total) by mouth every 6 (six) hours as needed for nausea or vomiting. 03/18/16   Renne Crigler, PA-C  valACYclovir (VALTREX) 500 MG tablet Take 1 tablet (500 mg total) by mouth 2 (two) times daily as needed (outbreaks). 12/06/15   Henrietta Hoover, NP  zolpidem (AMBIEN) 5 MG tablet Take 1 tablet (5 mg total) by mouth at bedtime as needed for sleep. 08/18/16   Jaynie Crumble, PA-C    Family History Family History  Problem Relation Age of Onset  . Diabetes Mother   . Hypertension Mother   . Thyroid disease Sister     Social History Social History  Substance Use Topics  .  Smoking status: Never Smoker  . Smokeless tobacco: Never Used  . Alcohol use No     Allergies   Other   Review of Systems Review of Systems  Constitutional: Negative for chills and fever.  HENT: Negative.   Respiratory: Negative for shortness of breath.   Cardiovascular: Negative for chest pain.  Gastrointestinal: Positive for nausea. Negative for vomiting.  Genitourinary: Positive for dysuria, frequency and urgency.  Musculoskeletal: Positive for back pain.  Skin: Negative for rash.  Neurological: Negative for weakness and headaches.  Psychiatric/Behavioral: The patient is not nervous/anxious.      Physical Exam Updated Vital Signs BP (!) 132/92 (BP Location: Right Arm)   Pulse 95   Temp 98.3 F (36.8 C) (Oral)   Resp 18   LMP 12/27/2003   SpO2 100%   Physical Exam  Constitutional: She appears well-developed and well-nourished. No distress.  HENT:  Head: Normocephalic and atraumatic.  Eyes: EOM are normal.  Neck: Neck supple.  Cardiovascular: Normal rate and regular rhythm.   Pulmonary/Chest: Effort normal and breath sounds normal.  Abdominal: Soft. Bowel sounds are normal. She exhibits no distension. There is tenderness in the suprapubic area.  Suprapubic abdominal TTP.   Genitourinary: Vaginal discharge found.  Genitourinary Comments: Scribe chaperone present for exam. External genitalia without lesions. Yellow mucoid discharge in the vaginal vault. Cervix is absent. Uterus is absent.  Musculoskeletal:       Lumbar back: She exhibits tenderness and spasm. She exhibits normal range of motion and normal pulse.  Neurological: She is alert. She has normal strength. No sensory deficit. Gait normal.  Skin: Skin is warm and dry.  Psychiatric: She has a normal mood and affect. Her behavior is normal.  Nursing note and vitals reviewed.    ED Treatments / Results  DIAGNOSTIC STUDIES: Oxygen Saturation is 100% on RA, nl by my interpretation.    COORDINATION OF CARE: 11:12 PM Discussed treatment plan with pt at bedside which includes UA, pelvic exam and pt agreed to plan.    Labs (all labs ordered are listed, but only abnormal results are displayed) Labs Reviewed  WET PREP, GENITAL - Abnormal; Notable for the following:       Result Value   WBC, Wet Prep HPF POC MANY (*)    All other components within normal limits  URINALYSIS, ROUTINE W REFLEX MICROSCOPIC - Abnormal; Notable for the following:    Color, Urine STRAW (*)    Specific Gravity, Urine 1.003 (*)    All other components within normal limits  CBC WITH DIFFERENTIAL/PLATELET - Abnormal; Notable for the following:    Hemoglobin 10.5 (*)    HCT 33.0 (*)    MCV 73.0 (*)    MCH 23.2 (*)    RDW 16.9 (*)    All other components within normal limits  BASIC METABOLIC PANEL - Abnormal; Notable for the following:    BUN <5 (*)    All other components within normal limits  GC/CHLAMYDIA PROBE AMP ([place]) NOT AT [facility]     Radiology No results found.  Procedures Procedures (including critical care time)  Medications Ordered in ED Medications  phenazopyridine (PYRIDIUM) tablet 200 mg (200 mg Oral Given 11/15/16 2357)  azithromycin (ZITHROMAX) tablet 1,000 mg (1,000 mg Oral Given 11/15/16 2357)       Initial Impression / Assessment and Plan / ED Course  I have reviewed the triage vital signs and the nursing notes.  Pertinent lab results that were available during my care of  the patient were reviewed by me and considered in my medical decision making (see chart for details).   Final Clinical Impressions(s) / ED Diagnoses  41 y.o. female with low back pain and UTI symptoms stable for d/c without fever and does not appear toxic. Normal vital signs, back pain increases with range of motion and is most likely muscular pain. Rx muscle relaxant.  Treated with Zithromax 1 gram PO in the event that the dysuria is due to Chlamydia infection. Cultures pending.  Will Rx Pyridium for bladder spasm. Discussed with the patient need for f/u with PCP in the next few days. Return precautions discussed.  Final diagnoses:  Strain of lumbar region, initial encounter  Dysuria    New Prescriptions Discharge Medication List as of 11/16/2016 12:59 AM    START taking these medications   Details  diclofenac (VOLTAREN) 50 MG EC tablet Take 1 tablet (50 mg total) by mouth 2 (two) times daily., Starting Sat 11/16/2016, Print    methocarbamol (ROBAXIN) 500 MG tablet Take 1 tablet (500 mg total) by mouth 2 (two) times daily., Starting Sat 11/16/2016, Print    phenazopyridine (PYRIDIUM) 200 MG tablet Take 1 tablet (200 mg total) by mouth 3 (three) times daily., Starting Sat 11/16/2016, Print       I personally performed the services described in this documentation, which was scribed in my presence. The recorded information has been reviewed and is accurate.     Kerrie Buffalo [place], [place] 11/16/16 1610    Pricilla Loveless, MD 11/16/16 [phone]

## 2016-11-16 LAB — CBC WITH DIFFERENTIAL/PLATELET
Basophils Absolute: 0 10*3/uL (ref 0.0–0.1)
Basophils Relative: 0 %
Eosinophils Absolute: 0.1 10*3/uL (ref 0.0–0.7)
Eosinophils Relative: 1 %
HCT: 33 % — ABNORMAL LOW (ref 36.0–46.0)
Hemoglobin: 10.5 g/dL — ABNORMAL LOW (ref 12.0–15.0)
Lymphocytes Relative: 56 %
Lymphs Abs: 3.6 10*3/uL (ref 0.7–4.0)
MCH: 23.2 pg — ABNORMAL LOW (ref 26.0–34.0)
MCHC: 31.8 g/dL (ref 30.0–36.0)
MCV: 73 fL — ABNORMAL LOW (ref 78.0–100.0)
Monocytes Absolute: 0.4 10*3/uL (ref 0.1–1.0)
Monocytes Relative: 7 %
Neutro Abs: 2.3 10*3/uL (ref 1.7–7.7)
Neutrophils Relative %: 36 %
Platelets: 287 10*3/uL (ref 150–400)
RBC: 4.52 MIL/uL (ref 3.87–5.11)
RDW: 16.9 % — ABNORMAL HIGH (ref 11.5–15.5)
WBC: 6.4 10*3/uL (ref 4.0–10.5)

## 2016-11-16 LAB — BASIC METABOLIC PANEL
Anion gap: 9 (ref 5–15)
BUN: <5 mg/dL — ABNORMAL LOW (ref 6–20)
CO2: 28 mmol/L (ref 22–32)
Calcium: 9.2 mg/dL (ref 8.9–10.3)
Chloride: 102 mmol/L (ref 101–111)
Creatinine, Ser: 0.86 mg/dL (ref 0.44–1.00)
GFR calc Af Amer: >60 mL/min (ref >60)
GFR calc non Af Amer: >60 mL/min (ref >60)
Glucose, Bld: 95 mg/dL (ref 65–99)
Potassium: 4 mmol/L (ref 3.5–5.1)
Sodium: 139 mmol/L (ref 135–145)

## 2016-11-16 MED ORDER — METHOCARBAMOL 500 MG PO TABS: 500.0000 mg | ORAL_TABLET | Freq: Two times a day (BID) | ORAL | 0 refills | Status: DC

## 2016-11-16 MED ORDER — DICLOFENAC SODIUM 50 MG PO TBEC: 50.0000 mg | DELAYED_RELEASE_TABLET | Freq: Two times a day (BID) | ORAL | 0 refills | Status: DC

## 2016-11-16 MED ORDER — PHENAZOPYRIDINE HCL 200 MG PO TABS: 200.0000 mg | ORAL_TABLET | Freq: Three times a day (TID) | ORAL | 0 refills | Status: DC

## 2016-11-16 NOTE — ED Notes (Signed)
Pt understood dc material. NAD noted. Scripts given at dc 

## 2016-11-16 NOTE — Discharge Instructions (Signed)
Do not drive while taking the muscle relaxant as it will make you sleepy.  Follow up with your doctor in the next few days.  Return here as needed for worsening symptoms.

## 2016-11-18 LAB — GC/CHLAMYDIA PROBE AMP (~~LOC~~) NOT AT ARMC
Chlamydia: NEGATIVE
Neisseria Gonorrhea: NEGATIVE

## 2016-11-25 ENCOUNTER — Emergency Department (HOSPITAL_COMMUNITY)
Admission: EM | Admit: 2016-11-25 | Discharge: 2016-11-25 | Disposition: A | Discharge status: Home / Self Care | Payer: Self-pay | Attending: Emergency Medicine | Admitting: Emergency Medicine

## 2016-11-25 ENCOUNTER — Encounter (HOSPITAL_COMMUNITY): Payer: Self-pay | Admitting: *Deleted

## 2016-11-25 ENCOUNTER — Emergency Department (HOSPITAL_COMMUNITY): Payer: Self-pay

## 2016-11-25 DIAGNOSIS — R0982 Postnasal drip: Secondary | ICD-10-CM | POA: Insufficient documentation

## 2016-11-25 DIAGNOSIS — H9203 Otalgia, bilateral: Secondary | ICD-10-CM | POA: Insufficient documentation

## 2016-11-25 DIAGNOSIS — R0789 Other chest pain: Secondary | ICD-10-CM | POA: Insufficient documentation

## 2016-11-25 DIAGNOSIS — Z79899 Other long term (current) drug therapy: Secondary | ICD-10-CM | POA: Insufficient documentation

## 2016-11-25 LAB — I-STAT CHEM 8, ED
BUN: 5 mg/dL — ABNORMAL LOW (ref 6–20)
Calcium, Ion: 1.15 mmol/L (ref 1.15–1.40)
Chloride: 104 mmol/L (ref 101–111)
Creatinine, Ser: 0.8 mg/dL (ref 0.44–1.00)
Glucose, Bld: 81 mg/dL (ref 65–99)
HCT: 33 % — ABNORMAL LOW (ref 36.0–46.0)
Hemoglobin: 11.2 g/dL — ABNORMAL LOW (ref 12.0–15.0)
Potassium: 3.3 mmol/L — ABNORMAL LOW (ref 3.5–5.1)
Sodium: 142 mmol/L (ref 135–145)
TCO2: 25 mmol/L (ref 0–100)

## 2016-11-25 LAB — I-STAT TROPONIN, ED: Troponin i, poc: 0 ng/mL (ref 0.00–0.08)

## 2016-11-25 MED ORDER — FLUTICASONE PROPIONATE 50 MCG/ACT NA SUSP: 2.0000 | Freq: Every day | NASAL | 0 refills | Status: DC

## 2016-11-25 MED ORDER — SALINE SPRAY 0.65 % NA SOLN: 1.0000 | NASAL | 0 refills | Status: DC | PRN

## 2016-11-25 NOTE — Discharge Instructions (Signed)
You were seen today for chest pain. Your workup is reassuring. You also had ear pain, sore throat, and several other symptoms. This may be partially related to seasonal allergies. Continue Claritin. Add Flonase and nasal saline. Follow-up closely with your primary physician regarding your other ongoing complaints including fatigue.

## 2016-11-25 NOTE — ED Notes (Signed)
Pt taken to xray 

## 2016-11-25 NOTE — ED Triage Notes (Signed)
Pt c/o R ear, eye, throat and chest pain onset two days ago with weakness and fatigue.

## 2016-11-25 NOTE — ED Provider Notes (Signed)
[facility] Provider Note   CSN: [id number] Arrival date & time: 11/25/16  0345  By signing my name below, I, Diona BrownerJennifer Gorman, attest that this documentation has been prepared under the direction and in the presence of Adrine Hayworth, Mayer Maskerourtney F, MD. Electronically Signed: Diona BrownerJennifer Gorman, ED Scribe. 11/25/16. 4:03 AM.  History   Chief Complaint Chief Complaint  Patient presents with  . Otalgia  . Chest Pain    HPI [name] is a 41 y.o. female with a PMHx of GERD and hysterectomy, who presents to the Emergency Department complaining of dull chest tightness that started a couple of days ago. Additionally, she c/o intermittent sore throat, ear fullness, and right eye pain for the last month. Associated sx include diaphoresis, fatigue, SOB, and lightheadedness. Pt takes Claritin for her allergies and has tried a throat spray with mild relief. No hx of blood clots. Pt denies fever, and cough. She has not seen her primary M.D.  The history is provided by the patient. No language interpreter was used.    Past Medical History:  Diagnosis Date  . Anemia   . Constipation   . GERD (gastroesophageal reflux disease)   . Hx of endometriosis   . Macromastia 07/2011  . Migraine     Patient Active Problem List   Diagnosis Date Noted  . Gastroesophageal reflux disease 06/27/2016  . Disturbance of skin sensation 04/27/2014  . Dizziness and giddiness 03/14/2014  . Headache, migraine 03/14/2014  . Myalgia and myositis, unspecified 06/07/2013  . Nausea and vomiting in adult 08/22/2012  . Viral syndrome 08/22/2012  . Pelvic pain in female 12/18/2010  . PID (acute pelvic inflammatory disease) 12/18/2010  . ANEMIA-IRON DEFICIENCY 10/15/2006  . HERPES, GENITAL NOS 09/30/2006  . OBESITY NOS 09/30/2006  . DISORDER, NONORGANIC SLEEP NOS 09/30/2006  . CONSTIPATION NOS 09/30/2006  . NEURITIS, LUMBOSACRAL NOS 09/30/2006  . HX, PERSONAL, MENTAL DISORDER NOS 09/30/2006    Past Surgical  History:  Procedure Laterality Date  . ABDOMINAL HYSTERECTOMY  12/12/2003  . ADENOIDECTOMY  05/2011  . ANKLE ARTHROSCOPY  12/29/2007   right; with extensive debridement  . BLADDER NECK RECONSTRUCTION  01/14/2011   Procedure: BLADDER NECK REPAIR;  Surgeon: Reva Boresanya S Pratt, MD;  Location: [facility];  Service: Gynecology;  Laterality: N/A;  Laparoscopic Repair of Incidental Cystotomy  . BREAST REDUCTION SURGERY  08/05/2011   Procedure: MAMMARY REDUCTION  (BREAST);  Surgeon: Louisa SecondGerald Truesdale, MD;  Location: [facility];  Service: Plastics;  Laterality: Bilateral;  bilateral  . CESAREAN SECTION  12/29/2000; 1994  . DILATION AND CURETTAGE OF UTERUS  07/08/2003   open laparoscopy  . LAPAROSCOPIC SALPINGOOPHERECTOMY  01/14/2011   left  . REPAIR PERONEAL TENDONS ANKLE  01/03/2010   repair right subluxing peroneal tendons  . RIGHT OOPHORECTOMY     with lysis of adhesions  . SALPINGECTOMY  12/12/2003   right; with TAH  . TONSILLECTOMY  as a child  . TUBAL LIGATION  12/29/2000    OB History    Gravida Para Term Preterm AB Living   2 2 2     2    SAB TAB Ectopic Multiple Live Births                   Home Medications    Prior to Admission medications   Medication Sig Start Date End Date Taking? Authorizing Provider  acetaminophen (TYLENOL) 500 MG tablet Take 1 tablet (500 mg total) by mouth every 6 (six) hours as needed for  mild pain (pain). 07/24/16   Massie Maroon, FNP  albuterol (PROVENTIL HFA;VENTOLIN HFA) 108 (90 Base) MCG/ACT inhaler Inhale 1-2 puffs into the lungs every 6 (six) hours as needed for wheezing or shortness of breath (cough). 05/11/16   Riki Sheer, PA-C  cephALEXin (KEFLEX) 500 MG capsule Take 1 capsule (500 mg total) by mouth 3 (three) times daily. 11/09/16   Fayrene Helper, PA-C  cetirizine (ZYRTEC) 10 MG tablet Take 1 tablet (10 mg total) by mouth daily. Patient taking differently: Take 10 mg by mouth daily as needed for allergies.  07/24/16   Massie Maroon, FNP    diclofenac (VOLTAREN) 50 MG EC tablet Take 1 tablet (50 mg total) by mouth 2 (two) times daily. 11/16/16   Janne Napoleon, NP  docusate sodium (COLACE) 100 MG capsule Take 100 mg by mouth 2 (two) times daily as needed for moderate constipation.     [provider]  esomeprazole (NEXIUM) 40 MG capsule Take 1 capsule (40 mg total) by mouth every morning. 06/26/16   Massie Maroon, FNP  fluticasone (FLONASE) 50 MCG/ACT nasal spray Place 2 sprays into both nostrils daily. 11/25/16   Cristel Rail, Mayer Masker, MD  ibuprofen (ADVIL,MOTRIN) 600 MG tablet Take 1 tablet (600 mg total) by mouth every 6 (six) hours as needed. 11/09/16   Fayrene Helper, PA-C  methocarbamol (ROBAXIN) 500 MG tablet Take 1 tablet (500 mg total) by mouth 2 (two) times daily. 11/16/16   Janne Napoleon, NP  ondansetron (ZOFRAN) 4 MG tablet Take 1 tablet (4 mg total) by mouth every 8 (eight) hours as needed for nausea or vomiting. 06/26/16   Massie Maroon, FNP  phenazopyridine (PYRIDIUM) 200 MG tablet Take 1 tablet (200 mg total) by mouth 3 (three) times daily. 11/16/16   Janne Napoleon, NP  promethazine (PHENERGAN) 25 MG tablet Take 1 tablet (25 mg total) by mouth every 6 (six) hours as needed for nausea or vomiting. 03/18/16   Renne Crigler, PA-C  sodium chloride (OCEAN) 0.65 % SOLN nasal spray Place 1 spray into both nostrils as needed for congestion. 11/25/16   Kerrin Markman, Mayer Masker, MD  valACYclovir (VALTREX) 500 MG tablet Take 1 tablet (500 mg total) by mouth 2 (two) times daily as needed (outbreaks). 12/06/15   Henrietta Hoover, NP  zolpidem (AMBIEN) 5 MG tablet Take 1 tablet (5 mg total) by mouth at bedtime as needed for sleep. 08/18/16   Jaynie Crumble, PA-C    Family History Family History  Problem Relation Age of Onset  . Diabetes Mother   . Hypertension Mother   . Thyroid disease Sister     Social History Social History  Substance Use Topics  . Smoking status: Never Smoker  . Smokeless tobacco: Never Used  .  Alcohol use No     Allergies   Other   Review of Systems Review of Systems  Constitutional: Positive for diaphoresis and fatigue. Negative for fever.  HENT: Positive for ear pain and sore throat.   Eyes: Positive for pain (right).  Respiratory: Positive for shortness of breath. Negative for cough.   Cardiovascular: Positive for chest pain.  Neurological: Positive for light-headedness.  All other systems reviewed and are negative.    Physical Exam Updated Vital Signs BP 122/85   Pulse 84   Temp 98.4 F (36.9 C) (Oral)   Resp 13   LMP 12/27/2003   SpO2 100%   Physical Exam  Constitutional: She is oriented to person, place, and  time. She appears well-developed and well-nourished. No distress.  HENT:  Head: Normocephalic and atraumatic.  Fluid noted behind the bilateral TMs, no dulling of the light reflex, no significant erythema Oropharynx without significant erythema, uvula midline, no tonsillar enlargement, postnasal drip noted  Eyes: Pupils are equal, round, and reactive to light. Conjunctivae are normal. Right eye exhibits no discharge. Left eye exhibits no discharge.  Neck: Normal range of motion. Neck supple.  Cardiovascular: Normal rate, regular rhythm and normal heart sounds.   Pulmonary/Chest: Effort normal. No respiratory distress. She has no wheezes. She exhibits no tenderness.  Abdominal: Soft. Bowel sounds are normal. There is no tenderness. There is no guarding.  Lymphadenopathy:    She has no cervical adenopathy.  Neurological: She is alert and oriented to person, place, and time.  Skin: Skin is warm and dry.  Psychiatric: She has a normal mood and affect.  Nursing note and vitals reviewed.    ED Treatments / Results  DIAGNOSTIC STUDIES: Oxygen Saturation is 100% on RA, normal by my interpretation.   COORDINATION OF CARE: 4:03 AM-Discussed next steps with pt. Pt verbalized understanding and is agreeable with the plan.   Labs (all labs ordered  are listed, but only abnormal results are displayed) Labs Reviewed  I-STAT CHEM 8, ED - Abnormal; Notable for the following:       Result Value   Potassium 3.3 (*)    BUN 5 (*)    Hemoglobin 11.2 (*)    HCT 33.0 (*)    All other components within normal limits  I-STAT TROPONIN, ED    EKG  EKG Interpretation  Date/Time:  Monday November 25 2016 03:49:20 EDT Ventricular Rate:  83 PR Interval:  150 QRS Duration: 96 QT Interval:  382 QTC Calculation: 448 R Axis:   94 Text Interpretation:  Normal sinus rhythm Rightward axis Borderline ECG Confirmed by Ross Marcus ([id number]) on 11/25/2016 3:53:37 AM       Radiology Dg Chest 2 View  Result Date: 11/25/2016 CLINICAL DATA:  Chest and throat pain for 2 days. Fatigue and weakness. EXAM: CHEST  2 VIEW COMPARISON:  08/18/2016 FINDINGS: The lungs are clear. The pulmonary vasculature is normal. Heart size is normal. Hilar and mediastinal contours are unremarkable. There is no pleural effusion. IMPRESSION: No active cardiopulmonary disease. Electronically Signed   By: Ellery Plunk M.D.   On: 11/25/2016 04:42    Procedures Procedures (including critical care time)  Medications Ordered in ED Medications - No data to display   Initial Impression / Assessment and Plan / ED Course  I have reviewed the triage vital signs and the nursing notes.  Pertinent labs & imaging results that were available during my care of the patient were reviewed by me and considered in my medical decision making (see chart for details).     Patient presents with multiple complaints. Primarily she reports chest pain ongoing for last several days. However she has a month history of ear fullness, sore throat, fatigue. She is nontoxic on exam. Vital signs reassuring. Regarding her chest pain, EKG is nonischemic. Troponin is negative. She is PERC negative.  She has no evidence of infection on exam. Given constellation of symptoms and duration, suspect allergy  related. Will add Flonase and nasal saline to drain the middle ear and help with postnasal drip. I discussed with patient that she needs to follow-up closely with primary physician for recheck of her chronic complaints.  After history, exam, and medical workup I feel the patient  has been appropriately medically screened and is safe for discharge home. Pertinent diagnoses were discussed with the patient. Patient was given return precautions.   Final Clinical Impressions(s) / ED Diagnoses   Final diagnoses:  Atypical chest pain  Otalgia of both ears  Post-nasal drip    New Prescriptions Discharge Medication List as of 11/25/2016  5:32 AM    START taking these medications   Details  fluticasone (FLONASE) 50 MCG/ACT nasal spray Place 2 sprays into both nostrils daily., Starting Mon 11/25/2016, Print    sodium chloride (OCEAN) 0.65 % SOLN nasal spray Place 1 spray into both nostrils as needed for congestion., Starting Mon 11/25/2016, Print       I personally performed the services described in this documentation, which was scribed in my presence. The recorded information has been reviewed and is accurate.     Shon Baton, MD 11/25/16 [phone]

## 2016-12-04 ENCOUNTER — Encounter (HOSPITAL_COMMUNITY): Payer: Self-pay | Admitting: Emergency Medicine

## 2016-12-04 ENCOUNTER — Emergency Department (HOSPITAL_COMMUNITY)
Admission: EM | Admit: 2016-12-04 | Discharge: 2016-12-04 | Disposition: A | Discharge status: Home / Self Care | Payer: No Typology Code available for payment source | Attending: Emergency Medicine | Admitting: Emergency Medicine

## 2016-12-04 DIAGNOSIS — J029 Acute pharyngitis, unspecified: Secondary | ICD-10-CM | POA: Insufficient documentation

## 2016-12-04 DIAGNOSIS — Z5321 Procedure and treatment not carried out due to patient leaving prior to being seen by health care provider: Secondary | ICD-10-CM | POA: Insufficient documentation

## 2016-12-04 NOTE — ED Triage Notes (Signed)
Pt has multiple complaints  Pt states she has sore throat and her neck is tender on the sides where her glands are, states she feels like she is going to pass out every now and then, has periods where her heart feels like it is racing, cant sleep, gets where she feels short of breath, has had some blurred vision, and generally has not been feeling well  Pt states sxs started over a month ago

## 2016-12-04 NOTE — ED Notes (Signed)
Pt to be seen by PA, but pt was not in her room and staff is unaware of where she could be.  RN notified.

## 2016-12-04 NOTE — ED Notes (Signed)
Pt to be seen by Pharmacy tech.  Informed by pharmacy tech pt was not in room nor was pt belongings.

## 2016-12-05 ENCOUNTER — Encounter (HOSPITAL_BASED_OUTPATIENT_CLINIC_OR_DEPARTMENT_OTHER): Payer: Self-pay

## 2016-12-05 ENCOUNTER — Encounter: Payer: Self-pay | Admitting: Family Medicine

## 2016-12-05 ENCOUNTER — Emergency Department (HOSPITAL_BASED_OUTPATIENT_CLINIC_OR_DEPARTMENT_OTHER)
Admission: EM | Admit: 2016-12-05 | Discharge: 2016-12-05 | Disposition: A | Discharge status: Home / Self Care | Payer: No Typology Code available for payment source | Attending: Emergency Medicine | Admitting: Emergency Medicine

## 2016-12-05 ENCOUNTER — Ambulatory Visit (INDEPENDENT_AMBULATORY_CARE_PROVIDER_SITE_OTHER): Payer: No Typology Code available for payment source | Admitting: Family Medicine

## 2016-12-05 VITALS — BP 130/86 | HR 85 | Temp 97.7°F | Resp 16 | Ht 64.0 in | Wt 209.4 lb

## 2016-12-05 DIAGNOSIS — M25512 Pain in left shoulder: Secondary | ICD-10-CM | POA: Insufficient documentation

## 2016-12-05 DIAGNOSIS — F458 Other somatoform disorders: Secondary | ICD-10-CM

## 2016-12-05 DIAGNOSIS — R0600 Dyspnea, unspecified: Secondary | ICD-10-CM | POA: Insufficient documentation

## 2016-12-05 DIAGNOSIS — R0602 Shortness of breath: Secondary | ICD-10-CM

## 2016-12-05 DIAGNOSIS — R079 Chest pain, unspecified: Secondary | ICD-10-CM | POA: Insufficient documentation

## 2016-12-05 DIAGNOSIS — R110 Nausea: Secondary | ICD-10-CM | POA: Insufficient documentation

## 2016-12-05 DIAGNOSIS — R202 Paresthesia of skin: Secondary | ICD-10-CM | POA: Insufficient documentation

## 2016-12-05 DIAGNOSIS — J029 Acute pharyngitis, unspecified: Secondary | ICD-10-CM

## 2016-12-05 DIAGNOSIS — H65191 Other acute nonsuppurative otitis media, right ear: Secondary | ICD-10-CM

## 2016-12-05 DIAGNOSIS — R0989 Other specified symptoms and signs involving the circulatory and respiratory systems: Secondary | ICD-10-CM

## 2016-12-05 DIAGNOSIS — G4700 Insomnia, unspecified: Secondary | ICD-10-CM

## 2016-12-05 DIAGNOSIS — R198 Other specified symptoms and signs involving the digestive system and abdomen: Secondary | ICD-10-CM

## 2016-12-05 DIAGNOSIS — Z79899 Other long term (current) drug therapy: Secondary | ICD-10-CM | POA: Insufficient documentation

## 2016-12-05 LAB — POCT URINALYSIS DIP (DEVICE)
Bilirubin Urine: NEGATIVE
Glucose, UA: NEGATIVE mg/dL
Hgb urine dipstick: NEGATIVE
Ketones, ur: NEGATIVE mg/dL
Nitrite: NEGATIVE
Protein, ur: NEGATIVE mg/dL
Specific Gravity, Urine: 1.015 (ref 1.005–1.030)
Urobilinogen, UA: 0.2 mg/dL (ref 0.0–1.0)
pH: 6.5 (ref 5.0–8.0)

## 2016-12-05 LAB — POCT RAPID STREP A (OFFICE): Rapid Strep A Screen: NEGATIVE

## 2016-12-05 LAB — TROPONIN I: Troponin I: <0.03 ng/mL (ref <0.03)

## 2016-12-05 MED ORDER — ALBUTEROL SULFATE HFA 108 (90 BASE) MCG/ACT IN AERS: 1.0000 | INHALATION_SPRAY | Freq: Four times a day (QID) | RESPIRATORY_TRACT | 2 refills | Status: DC | PRN

## 2016-12-05 MED ORDER — ZOLPIDEM TARTRATE 5 MG PO TABS: 5.0000 mg | ORAL_TABLET | Freq: Every evening | ORAL | 0 refills | Status: DC | PRN

## 2016-12-05 MED FILL — DEXAMETHASONE 4 MG TABLET: 4 | 1 days supply | Qty: 3 | Fill #0

## 2016-12-05 MED FILL — METHOCARBAMOL 500 MG TABLET: 500 | 10 days supply | Qty: 20 | Fill #0

## 2016-12-05 MED FILL — ESOMEPRAZOLE MAG DR 40 MG C: 40 | 30 days supply | Qty: 30 | Fill #4

## 2016-12-05 MED FILL — !VENTOLIN HFA INHALER: 108 (90 BAS | 25 days supply | Qty: 18 | Fill #0

## 2016-12-05 NOTE — ED Provider Notes (Signed)
[facility] Provider Note   CSN: [id number] Arrival date & time: 12/05/16  1920  By signing my name below, I, [name], attest that this documentation has been prepared under the direction and in the presence of physician practitioner, Raeford RazorKohut, Hulda Reddix, MD. Electronically Signed: Linna Darnerussell [name], Scribe. 12/05/2016. 9:52 PM.  History   Chief Complaint Chief Complaint  Patient presents with  . Chest Pain   The history is provided by the patient. No language interpreter was used.    HPI Comments: [name] is a 41 y.o. female who presents to the Emergency Department complaining of intermittent, gradually worsening chest pain for two weeks. She describes the pain as tight, dull, and aching. It is worse with palpation. There is associated dyspnea and mild nausea without vomiting. Today she developed tingling and pain in the left shoulder radiating distally. On a few occasions patient has had a sour, metallic taste in her mouth in association with her chest pain and other symptoms. There are no inciting factors noted. Patient has tried OTC pain medications without relief. Her symptoms are not worse at night. She has no prior h/o similar chest pain. Patient has never had a cardiac workup. At interview she denies any h/o GERD but has a documented history thereof. She denies cough, fevers, chills, leg pain/swelling, or any other associated symptoms.  Past Medical History:  Diagnosis Date  . Anemia   . Constipation   . GERD (gastroesophageal reflux disease)   . Hx of endometriosis   . Macromastia 07/2011  . Migraine     Patient Active Problem List   Diagnosis Date Noted  . Gastroesophageal reflux disease 06/27/2016  . Disturbance of skin sensation 04/27/2014  . Dizziness and giddiness 03/14/2014  . Headache, migraine 03/14/2014  . Myalgia and myositis, unspecified 06/07/2013  . Nausea and vomiting in adult 08/22/2012  . Viral syndrome 08/22/2012  . Pelvic pain in  female 12/18/2010  . PID (acute pelvic inflammatory disease) 12/18/2010  . ANEMIA-IRON DEFICIENCY 10/15/2006  . HERPES, GENITAL NOS 09/30/2006  . OBESITY NOS 09/30/2006  . DISORDER, NONORGANIC SLEEP NOS 09/30/2006  . CONSTIPATION NOS 09/30/2006  . NEURITIS, LUMBOSACRAL NOS 09/30/2006  . HX, PERSONAL, MENTAL DISORDER NOS 09/30/2006    Past Surgical History:  Procedure Laterality Date  . ABDOMINAL HYSTERECTOMY  12/12/2003  . ADENOIDECTOMY  05/2011  . ANKLE ARTHROSCOPY  12/29/2007   right; with extensive debridement  . BLADDER NECK RECONSTRUCTION  01/14/2011   Procedure: BLADDER NECK REPAIR;  Surgeon: Reva Boresanya S Pratt, MD;  Location: [facility];  Service: Gynecology;  Laterality: N/A;  Laparoscopic Repair of Incidental Cystotomy  . BREAST REDUCTION SURGERY  08/05/2011   Procedure: MAMMARY REDUCTION  (BREAST);  Surgeon: Louisa SecondGerald Truesdale, MD;  Location: [facility];  Service: Plastics;  Laterality: Bilateral;  bilateral  . CESAREAN SECTION  12/29/2000; 1994  . DILATION AND CURETTAGE OF UTERUS  07/08/2003   open laparoscopy  . LAPAROSCOPIC SALPINGOOPHERECTOMY  01/14/2011   left  . REPAIR PERONEAL TENDONS ANKLE  01/03/2010   repair right subluxing peroneal tendons  . RIGHT OOPHORECTOMY     with lysis of adhesions  . SALPINGECTOMY  12/12/2003   right; with TAH  . TONSILLECTOMY  as a child  . TUBAL LIGATION  12/29/2000    OB History    Gravida Para Term Preterm AB Living   2 2 2     2    SAB TAB Ectopic Multiple Live Births  Home Medications    Prior to Admission medications   Medication Sig Start Date End Date Taking? Authorizing Provider  acetaminophen (TYLENOL) 500 MG tablet Take 1 tablet (500 mg total) by mouth every 6 (six) hours as needed for mild pain (pain). 07/24/16   Massie MaroonHollis, Lachina M, FNP  albuterol (PROVENTIL HFA;VENTOLIN HFA) 108 (90 Base) MCG/ACT inhaler Inhale 1-2 puffs into the lungs every 6 (six) hours as needed for wheezing or shortness of breath  (cough). 12/05/16   Bing NeighborsHarris, Kimberly S, FNP  cetirizine (ZYRTEC) 10 MG tablet Take 1 tablet (10 mg total) by mouth daily. Patient taking differently: Take 10 mg by mouth daily as needed for allergies.  07/24/16   Massie MaroonHollis, Lachina M, FNP  diclofenac (VOLTAREN) 50 MG EC tablet Take 1 tablet (50 mg total) by mouth 2 (two) times daily. Patient not taking: Reported on 12/05/2016 11/16/16   Janne NapoleonNeese, Hope M, NP  docusate sodium (COLACE) 100 MG capsule Take 100 mg by mouth 2 (two) times daily as needed for moderate constipation.     [provider]  esomeprazole (NEXIUM) 40 MG capsule Take 1 capsule (40 mg total) by mouth every morning. 06/26/16   Massie MaroonHollis, Lachina M, FNP  fluticasone (FLONASE) 50 MCG/ACT nasal spray Place 2 sprays into both nostrils daily. 11/25/16   Horton, Mayer Maskerourtney F, MD  ibuprofen (ADVIL,MOTRIN) 600 MG tablet Take 1 tablet (600 mg total) by mouth every 6 (six) hours as needed. 11/09/16   Fayrene Helperran, Bowie, PA-C  methocarbamol (ROBAXIN) 500 MG tablet Take 1 tablet (500 mg total) by mouth 2 (two) times daily. Patient not taking: Reported on 12/05/2016 11/16/16   Janne NapoleonNeese, Hope M, NP  ondansetron (ZOFRAN) 4 MG tablet Take 1 tablet (4 mg total) by mouth every 8 (eight) hours as needed for nausea or vomiting. 06/26/16   Massie MaroonHollis, Lachina M, FNP  phenazopyridine (PYRIDIUM) 200 MG tablet Take 1 tablet (200 mg total) by mouth 3 (three) times daily. Patient not taking: Reported on 12/05/2016 11/16/16   Janne NapoleonNeese, Hope M, NP  promethazine (PHENERGAN) 25 MG tablet Take 1 tablet (25 mg total) by mouth every 6 (six) hours as needed for nausea or vomiting. Patient not taking: Reported on 12/05/2016 03/18/16   Renne CriglerGeiple, Joshua, PA-C  sodium chloride (OCEAN) 0.65 % SOLN nasal spray Place 1 spray into both nostrils as needed for congestion. 11/25/16   Horton, Mayer Maskerourtney F, MD  zolpidem (AMBIEN) 5 MG tablet Take 1 tablet (5 mg total) by mouth at bedtime as needed for sleep. 12/05/16   Bing NeighborsHarris, Kimberly S, FNP    Family History Family  History  Problem Relation Age of Onset  . Diabetes Mother   . Hypertension Mother   . Thyroid disease Sister     Social History Social History  Substance Use Topics  . Smoking status: Never Smoker  . Smokeless tobacco: Never Used  . Alcohol use No     Allergies   Other   Review of Systems Review of Systems  All other systems reviewed and are negative.  Physical Exam Updated Vital Signs BP 124/76   Pulse 80   Temp 98.8 F (37.1 C) (Oral)   Resp 17   Ht 5\' 4"  (1.626 m)   Wt 210 lb (95.3 kg)   LMP 12/27/2003   SpO2 100%   BMI 36.05 kg/m   Physical Exam  Constitutional: She appears well-developed and well-nourished.  HENT:  Head: Normocephalic.  Right Ear: External ear normal.  Left Ear: External ear normal.  Nose: Nose  normal.  Mouth/Throat: Oropharynx is clear and moist.  Eyes: Conjunctivae are normal. Right eye exhibits no discharge. Left eye exhibits no discharge.  Neck: Normal range of motion.  Cardiovascular: Normal rate, regular rhythm and normal heart sounds.   No murmur heard. Pulmonary/Chest: Effort normal and breath sounds normal. No respiratory distress. She has no wheezes. She has no rales. She exhibits tenderness.  Mild TTP over upper sternum.  Abdominal: Soft. She exhibits no distension. There is no tenderness. There is no rebound and no guarding.  Musculoskeletal: Normal range of motion. She exhibits no edema or tenderness.  Neurological: She is alert. No cranial nerve deficit. Coordination normal.  Skin: Skin is warm and dry. No rash noted. No erythema. No pallor.  Psychiatric: She has a normal mood and affect. Her behavior is normal.  Nursing note and vitals reviewed.  ED Treatments / Results  Labs (all labs ordered are listed, but only abnormal results are displayed) Labs Reviewed  TROPONIN I    EKG  EKG Interpretation None       Radiology No results found.  Procedures Procedures (including critical care time)  DIAGNOSTIC  STUDIES: Oxygen Saturation is 100% on RA, normal by my interpretation.    COORDINATION OF CARE: 9:52 PM Discussed treatment plan with pt at bedside and pt agreed to plan.  Medications Ordered in ED Medications - No data to display   Initial Impression / Assessment and Plan / ED Course  I have reviewed the triage vital signs and the nursing notes.  Pertinent labs & imaging results that were available during my care of the patient were reviewed by me and considered in my medical decision making (see chart for details).     Cp. Atypical. Doubt ACS, PE, dissection or other emergent cause.   Final Clinical Impressions(s) / ED Diagnoses   Final diagnoses:  Chest pain, unspecified type    New Prescriptions New Prescriptions   No medications on file    I personally preformed the services scribed in my presence. The recorded information has been reviewed is accurate. Raeford Razor, MD.    Raeford Razor, MD 12/22/16 [phone]

## 2016-12-05 NOTE — ED Notes (Signed)
ED Provider at bedside. 

## 2016-12-05 NOTE — Progress Notes (Signed)
Patient ID: [name], female    DOB: 1976-01-31, 41 y.o.   MRN: [id number]  PCP: Bing Neighbors, FNP  Chief Complaint  Patient presents with  . Hospitalization Follow-up  . Adenopathy  . Headache  . Ear Pain    Subjective:  HPI [name] is a 41 y.o. female presents for evaluation of multiple complaints today. [name] has presented to the ED on 3 separate occasions in less than 30 days with complaints stemming from UTI, back pain, chest pain, URI complaints, and bilateral ear pain. She has had negative cardiac enzymes, unremarkable EKG, unremarkable chest x-rays. Today she insists that something is really wrong with her. She reports continued headache, shortness of breath, chest tightness, and sore throat, right ear pain, cervical lymph node swelling, and hair loss. During our discussion patient revealed that last evening she presented to the [facility] emergency department in [place], [place]  with chest pain, shortness of breath, and difficulty swallowing. She was treated with Decadron for URI symptoms. Despite the treatment she received last evening, all symptoms remain present. Reports the sensation of choking when she eats food and drinks water. She has no prior history of EGD , although she is chronically treated for GERD. She is experiencing difficulty sleeping as she is very worried and concerned about her health.  Social History   Social History  . Marital status: Divorced    Spouse name: N/A  . Number of children: 2  . Years of education: 12th   Occupational History  .  Other    [employer]   Social History Main Topics  . Smoking status: Never Smoker  . Smokeless tobacco: Never Used  . Alcohol use No  . Drug use: No  . Sexual activity: Not on file   Other Topics Concern  . Not on file   Social History Narrative   Patient lives at home with family.   Caffeine Use: 1 cup daily    Family History  Problem Relation Age of Onset  . Diabetes Mother    . Hypertension Mother   . Thyroid disease Sister    Review of Systems  Constitutional: Positive for fatigue. Negative for appetite change, fever and unexpected weight change.  HENT: Positive for congestion, sore throat and trouble swallowing. Negative for sinus pressure.   Eyes: Negative.   Respiratory: Positive for chest tightness and shortness of breath.   Cardiovascular: Positive for chest pain.  Neurological: Positive for dizziness and headaches. Negative for light-headedness.  Hematological: Negative.   Psychiatric/Behavioral: Positive for sleep disturbance.   See HPI  Patient Active Problem List   Diagnosis Date Noted  . Gastroesophageal reflux disease 06/27/2016  . Disturbance of skin sensation 04/27/2014  . Dizziness and giddiness 03/14/2014  . Headache, migraine 03/14/2014  . Myalgia and myositis, unspecified 06/07/2013  . Nausea and vomiting in adult 08/22/2012  . Viral syndrome 08/22/2012  . Pelvic pain in female 12/18/2010  . PID (acute pelvic inflammatory disease) 12/18/2010  . ANEMIA-IRON DEFICIENCY 10/15/2006  . HERPES, GENITAL NOS 09/30/2006  . OBESITY NOS 09/30/2006  . DISORDER, NONORGANIC SLEEP NOS 09/30/2006  . CONSTIPATION NOS 09/30/2006  . NEURITIS, LUMBOSACRAL NOS 09/30/2006  . HX, PERSONAL, MENTAL DISORDER NOS 09/30/2006    Allergies  Allergen Reactions  . Other     Seasonal allergies     Prior to Admission medications   Medication Sig Start Date End Date Taking? Authorizing Provider  acetaminophen (TYLENOL) 500 MG tablet Take 1 tablet (500  mg total) by mouth every 6 (six) hours as needed for mild pain (pain). 07/24/16  Yes Massie MaroonHollis, Lachina M, FNP  cetirizine (ZYRTEC) 10 MG tablet Take 1 tablet (10 mg total) by mouth daily. Patient taking differently: Take 10 mg by mouth daily as needed for allergies.  07/24/16  Yes Massie MaroonHollis, Lachina M, FNP  esomeprazole (NEXIUM) 40 MG capsule Take 1 capsule (40 mg total) by mouth every morning. 06/26/16  Yes Massie MaroonHollis,  Lachina M, FNP  fluticasone (FLONASE) 50 MCG/ACT nasal spray Place 2 sprays into both nostrils daily. 11/25/16  Yes Horton, Mayer Maskerourtney F, MD  ibuprofen (ADVIL,MOTRIN) 600 MG tablet Take 1 tablet (600 mg total) by mouth every 6 (six) hours as needed. 11/09/16  Yes Fayrene Helperran, Bowie, PA-C  ondansetron (ZOFRAN) 4 MG tablet Take 1 tablet (4 mg total) by mouth every 8 (eight) hours as needed for nausea or vomiting. 06/26/16  Yes Massie MaroonHollis, Lachina M, FNP  sodium chloride (OCEAN) 0.65 % SOLN nasal spray Place 1 spray into both nostrils as needed for congestion. 11/25/16  Yes Horton, Mayer Maskerourtney F, MD  valACYclovir (VALTREX) 500 MG tablet Take 1 tablet (500 mg total) by mouth 2 (two) times daily as needed (outbreaks). 12/06/15  Yes Henrietta HooverBernhardt, Linda C, NP  albuterol (PROVENTIL HFA;VENTOLIN HFA) 108 (90 Base) MCG/ACT inhaler Inhale 1-2 puffs into the lungs every 6 (six) hours as needed for wheezing or shortness of breath (cough). Patient not taking: Reported on 12/05/2016 05/11/16   Riki SheerYoung, Michelle G, PA-C  diclofenac (VOLTAREN) 50 MG EC tablet Take 1 tablet (50 mg total) by mouth 2 (two) times daily. Patient not taking: Reported on 12/05/2016 11/16/16   Janne NapoleonNeese, Hope M, NP  docusate sodium (COLACE) 100 MG capsule Take 100 mg by mouth 2 (two) times daily as needed for moderate constipation.     [provider]  methocarbamol (ROBAXIN) 500 MG tablet Take 1 tablet (500 mg total) by mouth 2 (two) times daily. Patient not taking: Reported on 12/05/2016 11/16/16   Janne NapoleonNeese, Hope M, NP  phenazopyridine (PYRIDIUM) 200 MG tablet Take 1 tablet (200 mg total) by mouth 3 (three) times daily. Patient not taking: Reported on 12/05/2016 11/16/16   Janne NapoleonNeese, Hope M, NP  promethazine (PHENERGAN) 25 MG tablet Take 1 tablet (25 mg total) by mouth every 6 (six) hours as needed for nausea or vomiting. Patient not taking: Reported on 12/05/2016 03/18/16   Renne CriglerGeiple, Joshua, PA-C  zolpidem (AMBIEN) 5 MG tablet Take 1 tablet (5 mg total) by mouth at bedtime as needed  for sleep. Patient not taking: Reported on 12/05/2016 08/18/16   Jaynie CrumbleKirichenko, Tatyana, PA-C    Past Medical, Surgical Family and Social History reviewed and updated.    Objective:   Today's Vitals   12/05/16 0829  BP: 130/86  Pulse: 85  Resp: 16  Temp: 97.7 F (36.5 C)  TempSrc: Oral  SpO2: 100%  Weight: 209 lb 6.4 oz (95 kg)  Height: 5\' 4"  (1.626 m)    Wt Readings from Last 3 Encounters:  12/05/16 209 lb 6.4 oz (95 kg)  11/08/16 210 lb (95.3 kg)  08/18/16 215 lb (97.5 kg)   Physical Exam  Constitutional: She is oriented to person, place, and time. She appears well-developed and well-nourished.  HENT:  Head: Normocephalic and atraumatic.  Eyes: Pupils are equal, round, and reactive to light. Conjunctivae are normal.  Neck: Normal range of motion. Neck supple.  Cardiovascular: Normal rate, regular rhythm, normal heart sounds and intact distal pulses.   Pulmonary/Chest: Effort normal  and breath sounds normal.  Musculoskeletal: Normal range of motion.  Neurological: She is alert and oriented to person, place, and time.  Skin: Skin is warm and dry.  Psychiatric: Her speech is normal and behavior is normal. Judgment normal. Her mood appears anxious. Cognition and memory are normal.   Assessment & Plan:  1. Shortness of breath, hx of bronchspasms -Resume albuterol inhaler for acute shortness of breath, 2 puffs every 4-6 hours as needed.  2. Sore throat - POCT rapid strep A-negative  3. Acute effusion of right ear -continue decadron. -add cetirizine   4. Insomnia, unspecified type -Start Ambien 5 mg at bedtime.  5. Globus sensation,  -Ambulatory referral to Gastroenterology.  RTC:   [name] S. Tiburcio PeaHarris, MSN, FNP-C [facility]  [address] [place], [place] [address] [phone]

## 2016-12-05 NOTE — ED Triage Notes (Signed)
Pt c/o chest pain/ SOB  x 2 week with left arm numbness x 1 day

## 2016-12-05 NOTE — Patient Instructions (Addendum)
Follow-up with me via phone if your symptoms have not improved by Monday. I will consider extending your course of steroids.  For shortness of breath, resume use of albuterol inhaler 2 puffs every 4-6 hours.   For right ear effusion, resume cetirizine 10 mg at bedtime every night.  I will prescribed you Ambien 5 mg as needed for insomnia.  I am referring you to Gastroenterology for symptoms of trouble swallowing and I will have my assistant follow-up on your referral to dermatology for for hair loss.     Earache, Adult An earache, or ear pain, can be caused by many things, including:  An infection.  Ear wax buildup.  Ear pressure.  Something in the ear that should not be there (foreign body).  A sore throat.  Tooth problems.  Jaw problems.  Treatment of the earache will depend on the cause. If the cause is not clear or cannot be determined, you may need to watch your symptoms until your earache goes away or until a cause is found. Follow these instructions at home: Pay attention to any changes in your symptoms. Take these actions to help with your pain:  Take or apply over-the-counter and prescription medicines only as told by your health care provider.  If you were prescribed an antibiotic medicine, use it as told by your health care provider. Do not stop using the antibiotic even if you start to feel better.  Do not put anything in your ear other than medicine that is prescribed by your health care provider.  If directed, apply heat to the affected area as often as told by your health care provider. Use the heat source that your health care provider recommends, such as a moist heat pack or a heating pad. ? Place a towel between your skin and the heat source. ? Leave the heat on for 20-30 minutes. ? Remove the heat if your skin turns bright red. This is especially important if you are unable to feel pain, heat, or cold. You may have a greater risk of getting burned.  If  directed, put ice on the ear: ? Put ice in a plastic bag. ? Place a towel between your skin and the bag. ? Leave the ice on for 20 minutes, 2-3 times a day.  Try resting in an upright position instead of lying down. This may help to reduce pressure in your ear and relieve pain.  Chew gum if it helps to relieve your ear pain.  Treat any allergies as told by your health care provider.  Keep all follow-up visits as told by your health care provider. This is important.  Contact a health care provider if:  Your pain does not improve within 2 days.  Your earache gets worse.  You have new symptoms.  You have a fever. Get help right away if:  You have a severe headache.  You have a stiff neck.  You have trouble swallowing.  You have redness or swelling behind your ear.  You have fluid or blood coming from your ear.  You have hearing loss.  You feel dizzy. This information is not intended to replace advice given to you by your health care provider. Make sure you discuss any questions you have with your health care provider. Document Released: 12/08/2003 Document Revised: 12/19/2015 Document Reviewed: 10/16/2015 Elsevier Interactive Patient Education  [employer]2018 [employer]

## 2016-12-05 NOTE — ED Notes (Addendum)
Pt  Noted to have brusing on bil arms from previous IV. Pt now states she was in [facility] Monday, [facility] and [facility] yesterday and PMD today

## 2016-12-05 NOTE — Discharge Instructions (Signed)
Follow-up with your PCP to discuss stress testing. Continue to take ibuprofen as needed for pain.

## 2016-12-08 ENCOUNTER — Encounter (HOSPITAL_COMMUNITY): Payer: Self-pay | Admitting: Emergency Medicine

## 2016-12-08 DIAGNOSIS — R1084 Generalized abdominal pain: Secondary | ICD-10-CM | POA: Insufficient documentation

## 2016-12-08 DIAGNOSIS — J029 Acute pharyngitis, unspecified: Secondary | ICD-10-CM | POA: Insufficient documentation

## 2016-12-08 DIAGNOSIS — R0789 Other chest pain: Secondary | ICD-10-CM | POA: Insufficient documentation

## 2016-12-08 DIAGNOSIS — Z79899 Other long term (current) drug therapy: Secondary | ICD-10-CM | POA: Insufficient documentation

## 2016-12-08 NOTE — ED Triage Notes (Addendum)
Pt reports having chest pain that has been ongoing for the last month and has been seen multiple times for same complaint. Pt reports having epigastric pain that started last night with burning sensation into neck.

## 2016-12-09 ENCOUNTER — Emergency Department (HOSPITAL_COMMUNITY): Payer: Self-pay

## 2016-12-09 ENCOUNTER — Emergency Department (HOSPITAL_COMMUNITY)
Admission: EM | Admit: 2016-12-09 | Discharge: 2016-12-09 | Disposition: A | Discharge status: Home / Self Care | Payer: Self-pay | Attending: Emergency Medicine | Admitting: Emergency Medicine

## 2016-12-09 DIAGNOSIS — J029 Acute pharyngitis, unspecified: Secondary | ICD-10-CM

## 2016-12-09 DIAGNOSIS — R1084 Generalized abdominal pain: Secondary | ICD-10-CM

## 2016-12-09 DIAGNOSIS — R0789 Other chest pain: Secondary | ICD-10-CM

## 2016-12-09 LAB — CBC
HCT: 32 % — ABNORMAL LOW (ref 36.0–46.0)
Hemoglobin: 10.3 g/dL — ABNORMAL LOW (ref 12.0–15.0)
MCH: 23.6 pg — ABNORMAL LOW (ref 26.0–34.0)
MCHC: 32.2 g/dL (ref 30.0–36.0)
MCV: 73.2 fL — ABNORMAL LOW (ref 78.0–100.0)
Platelets: 331 10*3/uL (ref 150–400)
RBC: 4.37 MIL/uL (ref 3.87–5.11)
RDW: 16.9 % — ABNORMAL HIGH (ref 11.5–15.5)
WBC: 9.1 10*3/uL (ref 4.0–10.5)

## 2016-12-09 LAB — URINALYSIS, ROUTINE W REFLEX MICROSCOPIC
Bilirubin Urine: NEGATIVE
Glucose, UA: NEGATIVE mg/dL
Hgb urine dipstick: NEGATIVE
Ketones, ur: NEGATIVE mg/dL
Nitrite: NEGATIVE
Protein, ur: NEGATIVE mg/dL
Specific Gravity, Urine: 1.005 (ref 1.005–1.030)
pH: 6 (ref 5.0–8.0)

## 2016-12-09 LAB — COMPREHENSIVE METABOLIC PANEL
ALT: 16 U/L (ref 14–54)
AST: 19 U/L (ref 15–41)
Albumin: 3.9 g/dL (ref 3.5–5.0)
Alkaline Phosphatase: 51 U/L (ref 38–126)
Anion gap: 7 (ref 5–15)
BUN: 9 mg/dL (ref 6–20)
CO2: 28 mmol/L (ref 22–32)
Calcium: 9 mg/dL (ref 8.9–10.3)
Chloride: 104 mmol/L (ref 101–111)
Creatinine, Ser: 0.87 mg/dL (ref 0.44–1.00)
GFR calc Af Amer: >60 mL/min (ref >60)
GFR calc non Af Amer: >60 mL/min (ref >60)
Glucose, Bld: 103 mg/dL — ABNORMAL HIGH (ref 65–99)
Potassium: 3.2 mmol/L — ABNORMAL LOW (ref 3.5–5.1)
Sodium: 139 mmol/L (ref 135–145)
Total Bilirubin: 0.3 mg/dL (ref 0.3–1.2)
Total Protein: 7.4 g/dL (ref 6.5–8.1)

## 2016-12-09 LAB — LIPASE, BLOOD: Lipase: 23 U/L (ref 11–51)

## 2016-12-09 LAB — TSH: TSH: 3.821 u[IU]/mL (ref 0.350–4.500)

## 2016-12-09 MED ORDER — GI COCKTAIL ~~LOC~~
30.0000 mL | Freq: Once | ORAL | Status: AC
  Administered 2016-12-09: 30 mL via ORAL
  Filled 2016-12-09: qty 30

## 2016-12-09 MED ORDER — HYDROCODONE-ACETAMINOPHEN 5-325 MG PO TABS
1.0000 | ORAL_TABLET | Freq: Once | ORAL | Status: AC
  Administered 2016-12-09: 1 via ORAL
  Filled 2016-12-09: qty 1

## 2016-12-09 MED FILL — ?CETIRIZINE HCL 10 MG TABLE: 10 | 30 days supply | Qty: 30 | Fill #2

## 2016-12-09 NOTE — Discharge Instructions (Signed)
You were seen today for multiple symptoms. You have had several workups which have been reassuring in the ED. You may need ENT and/or cardiology evaluation given your ongoing symptoms.

## 2016-12-09 NOTE — ED Provider Notes (Signed)
[facility]-EMERGENCY DEPT Provider Note   CSN: [id number] Arrival date & time: 12/08/16  2335     History   Chief Complaint Chief Complaint  Patient presents with  . Abdominal Pain    HPI [name] is a 41 y.o. female.  HPI  This is a 41 year old female with a history of anemia, constipation, GERD who presents with multiple complaints. She has been seen and evaluated at our emergency department including by me multiple times in the last month. She also was seen and no vomiting health on 7/30 and primary care visit on 8/2. Most recently seen in the ED on 8/2. She reports multiple symptoms including pressure and tightness in the chest and the throat, fullness in the head. She describes nausea and anorexia. She developed abdominal pain yesterday to central and sharp.  Denies fevers or infectious symptoms. She reports she has tried steroids, nasal saline, nasal steroid with minimal relief of her head congestion. She has been evaluated and had a CT PE protocol and a CT scan of her neck which were negative. She's also had multiple x-rays and basic labwork switch and reassuring. She is very anxious and states "what is wrong with me." She reports generalized fatigue. No intentional weight loss or weight gain.  Past Medical History:  Diagnosis Date  . Anemia   . Constipation   . GERD (gastroesophageal reflux disease)   . Hx of endometriosis   . Macromastia 07/2011  . Migraine     Patient Active Problem List   Diagnosis Date Noted  . Gastroesophageal reflux disease 06/27/2016  . Disturbance of skin sensation 04/27/2014  . Dizziness and giddiness 03/14/2014  . Headache, migraine 03/14/2014  . Myalgia and myositis, unspecified 06/07/2013  . Nausea and vomiting in adult 08/22/2012  . Viral syndrome 08/22/2012  . Pelvic pain in female 12/18/2010  . PID (acute pelvic inflammatory disease) 12/18/2010  . ANEMIA-IRON DEFICIENCY 10/15/2006  . HERPES, GENITAL NOS 09/30/2006  . OBESITY NOS  09/30/2006  . DISORDER, NONORGANIC SLEEP NOS 09/30/2006  . CONSTIPATION NOS 09/30/2006  . NEURITIS, LUMBOSACRAL NOS 09/30/2006  . HX, PERSONAL, MENTAL DISORDER NOS 09/30/2006    Past Surgical History:  Procedure Laterality Date  . ABDOMINAL HYSTERECTOMY  12/12/2003  . ADENOIDECTOMY  05/2011  . ANKLE ARTHROSCOPY  12/29/2007   right; with extensive debridement  . BLADDER NECK RECONSTRUCTION  01/14/2011   Procedure: BLADDER NECK REPAIR;  Surgeon: Reva Bores, MD;  Location: [facility];  Service: Gynecology;  Laterality: N/A;  Laparoscopic Repair of Incidental Cystotomy  . BREAST REDUCTION SURGERY  08/05/2011   Procedure: MAMMARY REDUCTION  (BREAST);  Surgeon: Louisa Second, MD;  Location: [facility];  Service: Plastics;  Laterality: Bilateral;  bilateral  . CESAREAN SECTION  12/29/2000; 1994  . DILATION AND CURETTAGE OF UTERUS  07/08/2003   open laparoscopy  . LAPAROSCOPIC SALPINGOOPHERECTOMY  01/14/2011   left  . REPAIR PERONEAL TENDONS ANKLE  01/03/2010   repair right subluxing peroneal tendons  . RIGHT OOPHORECTOMY     with lysis of adhesions  . SALPINGECTOMY  12/12/2003   right; with TAH  . TONSILLECTOMY  as a child  . TUBAL LIGATION  12/29/2000    OB History    Gravida Para Term Preterm AB Living   2 2 2     2    SAB TAB Ectopic Multiple Live Births                   Home Medications  Prior to Admission medications   Medication Sig Start Date End Date Taking? Authorizing Provider  acetaminophen (TYLENOL) 500 MG tablet Take 1 tablet (500 mg total) by mouth every 6 (six) hours as needed for mild pain (pain). 07/24/16   Massie Maroon, FNP  albuterol (PROVENTIL HFA;VENTOLIN HFA) 108 (90 Base) MCG/ACT inhaler Inhale 1-2 puffs into the lungs every 6 (six) hours as needed for wheezing or shortness of breath (cough). 12/05/16   Bing Neighbors, FNP  cetirizine (ZYRTEC) 10 MG tablet Take 1 tablet (10 mg total) by mouth daily. Patient taking differently: Take 10 mg by  mouth daily as needed for allergies.  07/24/16   Massie Maroon, FNP  diclofenac (VOLTAREN) 50 MG EC tablet Take 1 tablet (50 mg total) by mouth 2 (two) times daily. Patient not taking: Reported on 12/05/2016 11/16/16   Janne Napoleon, NP  docusate sodium (COLACE) 100 MG capsule Take 100 mg by mouth 2 (two) times daily as needed for moderate constipation.     [provider]  esomeprazole (NEXIUM) 40 MG capsule Take 1 capsule (40 mg total) by mouth every morning. 06/26/16   Massie Maroon, FNP  fluticasone (FLONASE) 50 MCG/ACT nasal spray Place 2 sprays into both nostrils daily. 11/25/16   Horton, Mayer Masker, MD  ibuprofen (ADVIL,MOTRIN) 600 MG tablet Take 1 tablet (600 mg total) by mouth every 6 (six) hours as needed. 11/09/16   Fayrene Helper, PA-C  methocarbamol (ROBAXIN) 500 MG tablet Take 1 tablet (500 mg total) by mouth 2 (two) times daily. Patient not taking: Reported on 12/05/2016 11/16/16   Janne Napoleon, NP  ondansetron (ZOFRAN) 4 MG tablet Take 1 tablet (4 mg total) by mouth every 8 (eight) hours as needed for nausea or vomiting. 06/26/16   Massie Maroon, FNP  phenazopyridine (PYRIDIUM) 200 MG tablet Take 1 tablet (200 mg total) by mouth 3 (three) times daily. Patient not taking: Reported on 12/05/2016 11/16/16   Janne Napoleon, NP  promethazine (PHENERGAN) 25 MG tablet Take 1 tablet (25 mg total) by mouth every 6 (six) hours as needed for nausea or vomiting. Patient not taking: Reported on 12/05/2016 03/18/16   Renne Crigler, PA-C  sodium chloride (OCEAN) 0.65 % SOLN nasal spray Place 1 spray into both nostrils as needed for congestion. 11/25/16   Horton, Mayer Masker, MD  zolpidem (AMBIEN) 5 MG tablet Take 1 tablet (5 mg total) by mouth at bedtime as needed for sleep. 12/05/16   Bing Neighbors, FNP    Family History Family History  Problem Relation Age of Onset  . Diabetes Mother   . Hypertension Mother   . Thyroid disease Sister     Social History Social History  Substance Use  Topics  . Smoking status: Never Smoker  . Smokeless tobacco: Never Used  . Alcohol use No     Allergies   Other   Review of Systems Review of Systems  Constitutional: Positive for diaphoresis and fatigue. Negative for fever.  Respiratory: Negative for shortness of breath.   Cardiovascular: Positive for chest pain. Negative for leg swelling.  Gastrointestinal: Positive for abdominal pain and nausea. Negative for vomiting.  Genitourinary: Negative for dysuria.  All other systems reviewed and are negative.    Physical Exam Updated Vital Signs BP (!) 160/94 (BP Location: Left Arm)   Pulse 76   Temp 98.3 F (36.8 C) (Oral)   Resp 16   Ht 5\' 4"  (1.626 m)   Wt 95.3 kg (  210 lb)   LMP 12/27/2003   SpO2 100%   BMI 36.05 kg/m   Physical Exam  Constitutional: She is oriented to person, place, and time. She appears well-developed and well-nourished. No distress.  Overweight  HENT:  Head: Normocephalic and atraumatic.  Mouth/Throat: Oropharynx is clear and moist. No oropharyngeal exudate.  Eyes: Pupils are equal, round, and reactive to light.  Neck: Normal range of motion. Neck supple.  Thyroid normal size, mobile  Cardiovascular: Normal rate, regular rhythm and normal heart sounds.   No murmur heard. Pulmonary/Chest: Effort normal and breath sounds normal. No stridor. No respiratory distress. She has no wheezes. She exhibits tenderness.  Abdominal: Soft. Bowel sounds are normal. There is no tenderness. There is no rebound and no guarding.  Musculoskeletal: She exhibits no edema.  Neurological: She is alert and oriented to person, place, and time.  Skin: Skin is warm and dry.  Psychiatric: She has a normal mood and affect.  Nursing note and vitals reviewed.    ED Treatments / Results  Labs (all labs ordered are listed, but only abnormal results are displayed) Labs Reviewed  COMPREHENSIVE METABOLIC PANEL - Abnormal; Notable for the following:       Result Value    Potassium 3.2 (*)    Glucose, Bld 103 (*)    All other components within normal limits  CBC - Abnormal; Notable for the following:    Hemoglobin 10.3 (*)    HCT 32.0 (*)    MCV 73.2 (*)    MCH 23.6 (*)    RDW 16.9 (*)    All other components within normal limits  URINALYSIS, ROUTINE W REFLEX MICROSCOPIC - Abnormal; Notable for the following:    Color, Urine STRAW (*)    Leukocytes, UA TRACE (*)    Bacteria, UA MANY (*)    Squamous Epithelial / LPF 0-5 (*)    All other components within normal limits  LIPASE, BLOOD  TSH    EKG  EKG Interpretation  Date/Time:  Sunday December 08 2016 23:45:29 EDT Ventricular Rate:  79 PR Interval:    QRS Duration: 102 QT Interval:  366 QTC Calculation: 420 R Axis:   77 Text Interpretation:  Sinus rhythm RSR' in V1 or V2, right VCD or RVH Baseline wander in lead(s) V6 Confirmed by Ross Marcus ([id number]) on 12/09/2016 1:33:23 AM       Radiology Dg Abdomen Acute W/chest  Result Date: 12/09/2016 CLINICAL DATA:  Acute onset of epigastric abdominal pain, nausea and vomiting. Initial encounter. EXAM: DG ABDOMEN ACUTE W/ 1V CHEST COMPARISON:  Chest radiograph performed 11/25/2016, and CT of the abdomen and pelvis from 11/01/2015 FINDINGS: The lungs are well-aerated and clear. There is no evidence of focal opacification, pleural effusion or pneumothorax. The cardiomediastinal silhouette is within normal limits. The visualized bowel gas pattern is unremarkable. Scattered stool and air are seen within the colon; there is no evidence of small bowel dilatation to suggest obstruction. No free intra-abdominal air is identified on the provided upright view. Clips are noted within the right upper quadrant, reflecting prior cholecystectomy. No acute osseous abnormalities are seen; the sacroiliac joints are unremarkable in appearance. IMPRESSION: 1. Unremarkable bowel gas pattern; no free intra-abdominal air seen. Moderate amount of stool noted in the colon. 2. No  acute cardiopulmonary process seen. Electronically Signed   By: Roanna Raider M.D.   On: 12/09/2016 02:41    Procedures Procedures (including critical care time)  Medications Ordered in ED Medications  HYDROcodone-acetaminophen (NORCO/VICODIN) 5-325  MG per tablet 1 tablet (1 tablet Oral Given 12/09/16 0207)  gi cocktail (Maalox,Lidocaine,Donnatal) (30 mLs Oral Given 12/09/16 0232)     Initial Impression / Assessment and Plan / ED Course  I have reviewed the triage vital signs and the nursing notes.  Pertinent labs & imaging results that were available during my care of the patient were reviewed by me and considered in my medical decision making (see chart for details).     Patient again presents with multiple complaints. Ongoing chest pain, abdominal pain, head and throat fullness. She is nontoxic. Vital signs reassuring. O2 sats 100%. EKG is nonischemic. She has no signs of stridor or airway obstruction. Normal phonation. She's able to tolerate fluids without difficulty. Basic labwork obtained and largely reassuring. I have added on a TSH which is within normal limits. No obstructive signs on x-ray. I have done a chart review. She's had a recent CT of the neck and chest. Her symptoms are very atypical. I discussed with patient that she needs to follow-up with her primary physician as an outpatient for reevaluation and possible further testing. She is tearful and frustrated.  I discussed with her that I would also provide her ENT follow-up as she is very distressed regarding her throat "fullness" and cardiology evaluation for her ongoing chest pain which is very atypical for ACS.  At this time, there is no obvious unifying diagnosis.  However, doubt acute emergent process.  After history, exam, and medical workup I feel the patient has been appropriately medically screened and is safe for discharge home. Pertinent diagnoses were discussed with the patient. Patient was given return  precautions.   Final Clinical Impressions(s) / ED Diagnoses   Final diagnoses:  Sore throat  Other chest pain  Generalized abdominal pain    New Prescriptions New Prescriptions   No medications on file     Shon BatonHorton, Courtney F, MD 12/09/16 0330

## 2016-12-09 NOTE — ED Notes (Signed)
Pt transported to XR.  

## 2016-12-10 ENCOUNTER — Encounter: Payer: Self-pay | Admitting: Gastroenterology

## 2016-12-10 ENCOUNTER — Encounter: Payer: Self-pay | Admitting: Family Medicine

## 2016-12-10 ENCOUNTER — Ambulatory Visit (INDEPENDENT_AMBULATORY_CARE_PROVIDER_SITE_OTHER): Payer: No Typology Code available for payment source | Admitting: Family Medicine

## 2016-12-10 VITALS — BP 124/90 | HR 70 | Temp 97.9°F | Resp 16 | Ht 64.0 in | Wt 210.0 lb

## 2016-12-10 DIAGNOSIS — R6889 Other general symptoms and signs: Secondary | ICD-10-CM

## 2016-12-10 DIAGNOSIS — R0789 Other chest pain: Secondary | ICD-10-CM

## 2016-12-10 NOTE — Progress Notes (Signed)
Patient ID: [name], female    DOB: 10-07-1975, 41 y.o.   MRN: [id number]  PCP: Bing Neighbors, FNP  Chief Complaint  Patient presents with  . Follow-up    throat pain and swelling    Subjective:  HPI [name] is a 41 y.o. female presents for hospital follow-up .  [name] has had a total of 7 emergency department within the last 30 days. Over the last few weeks, she has experienced shortness of breath, chest pain, and sensation of choking. [name] has had extensive work-up to rule out any acute cardiac process. She reports that she is using the albuterol as prescribed for shortness of breath. [name] denies any she is experiencing any form of stress and or depression. She has a history of depression and anxiety although she reports that this was an episodic event related to situation with her daughter. [name] is very worried that something medically is wrong with her. She has been referred to gastroenterology for globus symptoms and recently referred to cardiology for chest pain evaluation by the emergency department. At present she denies chest pain, headache, shortness of breath or choking sensation. Social History   Social History  . Marital status: Divorced    Spouse name: N/A  . Number of children: 2  . Years of education: 12th   Occupational History  .  Other    [employer]   Social History Main Topics  . Smoking status: Never Smoker  . Smokeless tobacco: Never Used  . Alcohol use No  . Drug use: No  . Sexual activity: Not on file   Other Topics Concern  . Not on file   Social History Narrative   Patient lives at home with family.   Caffeine Use: 1 cup daily    Family History  Problem Relation Age of Onset  . Diabetes Mother   . Hypertension Mother   . Thyroid disease Sister    Review of Systems See HPI  Patient Active Problem List   Diagnosis Date Noted  . Gastroesophageal reflux disease 06/27/2016  . Disturbance of skin sensation  04/27/2014  . Dizziness and giddiness 03/14/2014  . Headache, migraine 03/14/2014  . Myalgia and myositis, unspecified 06/07/2013  . Nausea and vomiting in adult 08/22/2012  . Viral syndrome 08/22/2012  . Pelvic pain in female 12/18/2010  . PID (acute pelvic inflammatory disease) 12/18/2010  . ANEMIA-IRON DEFICIENCY 10/15/2006  . HERPES, GENITAL NOS 09/30/2006  . OBESITY NOS 09/30/2006  . DISORDER, NONORGANIC SLEEP NOS 09/30/2006  . CONSTIPATION NOS 09/30/2006  . NEURITIS, LUMBOSACRAL NOS 09/30/2006  . HX, PERSONAL, MENTAL DISORDER NOS 09/30/2006    Allergies  Allergen Reactions  . Other     Seasonal allergies     Prior to Admission medications   Medication Sig Start Date End Date Taking? Authorizing Provider  acetaminophen (TYLENOL) 500 MG tablet Take 1 tablet (500 mg total) by mouth every 6 (six) hours as needed for mild pain (pain). 07/24/16  Yes Massie Maroon, FNP  albuterol (PROVENTIL HFA;VENTOLIN HFA) 108 (90 Base) MCG/ACT inhaler Inhale 1-2 puffs into the lungs every 6 (six) hours as needed for wheezing or shortness of breath (cough). 12/05/16  Yes Bing Neighbors, FNP  cetirizine (ZYRTEC) 10 MG tablet Take 1 tablet (10 mg total) by mouth daily. Patient taking differently: Take 10 mg by mouth daily as needed for allergies.  07/24/16  Yes Massie Maroon, FNP  docusate sodium (COLACE) 100 MG capsule Take 100  mg by mouth 2 (two) times daily as needed for moderate constipation.    Yes [provider]  esomeprazole (NEXIUM) 40 MG capsule Take 1 capsule (40 mg total) by mouth every morning. 06/26/16  Yes Massie Maroon, FNP  fluticasone (FLONASE) 50 MCG/ACT nasal spray Place 2 sprays into both nostrils daily. 11/25/16  Yes Horton, Mayer Masker, MD  ibuprofen (ADVIL,MOTRIN) 600 MG tablet Take 1 tablet (600 mg total) by mouth every 6 (six) hours as needed. 11/09/16  Yes Fayrene Helper, PA-C  methocarbamol (ROBAXIN) 500 MG tablet Take 1 tablet (500 mg total) by mouth 2 (two)  times daily. 11/16/16  Yes Neese, Hope M, NP  ondansetron (ZOFRAN) 4 MG tablet Take 1 tablet (4 mg total) by mouth every 8 (eight) hours as needed for nausea or vomiting. 06/26/16  Yes Massie Maroon, FNP  phenazopyridine (PYRIDIUM) 200 MG tablet Take 1 tablet (200 mg total) by mouth 3 (three) times daily. 11/16/16  Yes Neese, Hope M, NP  promethazine (PHENERGAN) 25 MG tablet Take 1 tablet (25 mg total) by mouth every 6 (six) hours as needed for nausea or vomiting. 03/18/16  Yes Renne Crigler, PA-C  sodium chloride (OCEAN) 0.65 % SOLN nasal spray Place 1 spray into both nostrils as needed for congestion. 11/25/16  Yes Horton, Mayer Masker, MD  zolpidem (AMBIEN) 5 MG tablet Take 1 tablet (5 mg total) by mouth at bedtime as needed for sleep. 12/05/16  Yes Bing Neighbors, FNP  diclofenac (VOLTAREN) 50 MG EC tablet Take 1 tablet (50 mg total) by mouth 2 (two) times daily. Patient not taking: Reported on 12/10/2016 11/16/16   Janne Napoleon, NP    Past Medical, Surgical Family and Social History reviewed and updated.    Objective:   Today's Vitals   12/10/16 0846  BP: 124/90  Pulse: 70  Resp: 16  Temp: 97.9 F (36.6 C)  TempSrc: Oral  SpO2: 100%  Weight: 210 lb (95.3 kg)  Height: 5\' 4"  (1.626 m)    Wt Readings from Last 3 Encounters:  12/10/16 210 lb (95.3 kg)  12/08/16 210 lb (95.3 kg)  12/05/16 210 lb (95.3 kg)   Physical Exam  Constitutional: She is oriented to person, place, and time. She appears well-developed and well-nourished.  HENT:  Head: Normocephalic and atraumatic.  Eyes: Pupils are equal, round, and reactive to light. Conjunctivae and EOM are normal.  Neck: Normal range of motion. Neck supple. No thyromegaly present.  Cardiovascular: Normal rate, regular rhythm, normal heart sounds and intact distal pulses.   Pulmonary/Chest: Effort normal and breath sounds normal.  Abdominal: Soft. Bowel sounds are normal.  Musculoskeletal: Normal range of motion.  Lymphadenopathy:     She has no cervical adenopathy.  Neurological: She is alert and oriented to person, place, and time.  Skin: Skin is warm and dry.  Psychiatric: Her speech is normal and behavior is normal. Judgment and thought content normal. Her mood appears anxious. Cognition and memory are normal.   Assessment & Plan:  1. Physical symptoms without known medical cause -Provided reassurance to patient that all cardiac enzymes, EKG, and chest x-rays were negative. - Patient was encouraged to return to office for non-emergent symptoms  2. Atypical chest pain -Discharge summary from ED visit 12/09/2016, indicates referral to cardiology, however no cardiology referral is pending. I will place referral for patient to cardiology due to recent complaints of chest pain.   RTC: 4 weeks follow-up   Godfrey Pick. Tiburcio Pea, MSN, FNP-C [facility]  [facility]  [address] [place], [place] [address] [phone]

## 2016-12-13 ENCOUNTER — Other Ambulatory Visit: Payer: Self-pay | Admitting: Family Medicine

## 2016-12-13 ENCOUNTER — Encounter (HOSPITAL_COMMUNITY): Payer: Self-pay | Admitting: *Deleted

## 2016-12-13 ENCOUNTER — Ambulatory Visit (HOSPITAL_COMMUNITY)
Admission: EM | Admit: 2016-12-13 | Discharge: 2016-12-13 | Disposition: A | Discharge status: Home / Self Care | Payer: No Typology Code available for payment source | Attending: Emergency Medicine | Admitting: Emergency Medicine

## 2016-12-13 ENCOUNTER — Telehealth: Payer: Self-pay

## 2016-12-13 DIAGNOSIS — J029 Acute pharyngitis, unspecified: Secondary | ICD-10-CM

## 2016-12-13 DIAGNOSIS — R0789 Other chest pain: Secondary | ICD-10-CM

## 2016-12-13 MED ORDER — MAGIC MOUTHWASH W/LIDOCAINE: 5.0000 mL | Freq: Three times a day (TID) | ORAL | 0 refills | Status: DC | PRN

## 2016-12-13 NOTE — ED Provider Notes (Signed)
[facility]   [id number] 12/13/16 Arrival Time: 1929  ASSESSMENT & PLAN:  1. Sore throat     Meds ordered this encounter  Medications  . magic mouthwash w/lidocaine SOLN    Sig: Take 5 mLs by mouth 3 (three) times daily as needed for mouth pain.    Dispense:  100 mL    Refill:  0    1 Part Lidocaine, 1 Part Nystatin, 1 Part Maalox, 1 Part Benadryl    Order Specific Question:   Supervising Provider    Answer:   Clance Boll   Strongly encourage patient to follow-up with ear nose and throat for further evaluation of her symptoms. We'll give a short trial of Magic mouthwash. Reviewed expectations re: course of current medical issues. Questions answered. Outlined signs and symptoms indicating need for more acute intervention. Patient verbalized understanding. After Visit Summary given.   SUBJECTIVE:  [name] is a 41 y.o. female who presents with complaint of sore throat. She has had this complaint ongoing for several weeks, she has been evaluated multiple times both here, the emergency room, and by her primary care provider for this complaint, has had multiple rounds of steroids, and antibiotics, with minimal relief. She does have an appointment with ear nose and throat scheduled in October, but she feels that this is too long. She was seen in the ER 4 days ago, had multiple labs drawn and a workup, these results were reassuring, and she has had his head CT on August 1 with reportedly negative results. She has no new symptoms, reports nothing new has changed.  ROS: As per HPI, remainder of ROS negative.   OBJECTIVE:  Vitals:   12/13/16 2032  BP: (!) 149/92  Pulse: 80  Resp: 15  Temp: 99.2 F (37.3 C)  TempSrc: Oral  SpO2: 100%     General appearance: alert; no distress HEENT: normocephalic; atraumatic; conjunctivae normal; Oropharynx clear, no erythema, oropharyngeal edema, she has had a prior tonsillectomy, there is no exudate within the  mouth, there is no submental, submandibular, tonsillar lymphadenopathy, there is no tenderness along the throat or neck that would be indicated if of Ludwig's angina, or submandibular infections, no visible lesions within the oropharynx Neck: No cervical lymphadenopathy, no palpable thyroid masses Lungs: clear to auscultation bilaterally Heart: regular rate and rhythm Abdomen: soft, non-tender; bowel sounds normal; no masses or organomegaly; no guarding or rebound tenderness Back: no CVA tenderness Extremities: no cyanosis or edema; symmetrical with no gross deformities Skin: warm and dry Neurologic: Grossly normal Psychological:  alert and cooperative; normal mood and affect  Procedures:     Results for orders placed or performed during the hospital encounter of 12/09/16  Lipase, blood  Result Value Ref Range   Lipase 23 11 - 51 U/L  Comprehensive metabolic panel  Result Value Ref Range   Sodium 139 135 - 145 mmol/L   Potassium 3.2 (L) 3.5 - 5.1 mmol/L   Chloride 104 101 - 111 mmol/L   CO2 28 22 - 32 mmol/L   Glucose, Bld 103 (H) 65 - 99 mg/dL   BUN 9 6 - 20 mg/dL   Creatinine, Ser 4.09 0.44 - 1.00 mg/dL   Calcium 9.0 8.9 - 81.1 mg/dL   Total Protein 7.4 6.5 - 8.1 g/dL   Albumin 3.9 3.5 - 5.0 g/dL   AST 19 15 - 41 U/L   ALT 16 14 - 54 U/L   Alkaline Phosphatase 51 38 - 126 U/L  Total Bilirubin 0.3 0.3 - 1.2 mg/dL   GFR calc non Af Amer >60 >60 mL/min   GFR calc Af Amer >60 >60 mL/min   Anion gap 7 5 - 15  CBC  Result Value Ref Range   WBC 9.1 4.0 - 10.5 K/uL   RBC 4.37 3.87 - 5.11 MIL/uL   Hemoglobin 10.3 (L) 12.0 - 15.0 g/dL   HCT 16.132.0 (L) 09.636.0 - 04.546.0 %   MCV 73.2 (L) 78.0 - 100.0 fL   MCH 23.6 (L) 26.0 - 34.0 pg   MCHC 32.2 30.0 - 36.0 g/dL   RDW 40.916.9 (H) 81.111.5 - 91.415.5 %   Platelets 331 150 - 400 K/uL  Urinalysis, Routine w reflex microscopic  Result Value Ref Range   Color, Urine STRAW (A) YELLOW   APPearance CLEAR CLEAR   Specific Gravity, Urine 1.005 1.005 -  1.030   pH 6.0 5.0 - 8.0   Glucose, UA NEGATIVE NEGATIVE mg/dL   Hgb urine dipstick NEGATIVE NEGATIVE   Bilirubin Urine NEGATIVE NEGATIVE   Ketones, ur NEGATIVE NEGATIVE mg/dL   Protein, ur NEGATIVE NEGATIVE mg/dL   Nitrite NEGATIVE NEGATIVE   Leukocytes, UA TRACE (A) NEGATIVE   RBC / HPF 0-5 0 - 5 RBC/hpf   WBC, UA 0-5 0 - 5 WBC/hpf   Bacteria, UA MANY (A) NONE SEEN   Squamous Epithelial / LPF 0-5 (A) NONE SEEN   Mucous PRESENT    Budding Yeast PRESENT   TSH  Result Value Ref Range   TSH 3.821 0.350 - 4.500 uIU/mL    Labs Reviewed - No data to display  No results found.  Allergies  Allergen Reactions  . Other     Seasonal allergies     PMHx, SurgHx, SocialHx, Medications, and Allergies were reviewed in the Visit Navigator and updated as appropriate.       Dorena BodoKennard, Kemoni Ortega, NP 12/13/16 2106

## 2016-12-13 NOTE — ED Triage Notes (Signed)
Patient reports several week history of sore throat. States that she has taken antibiotics and steriods for same. Reports she feels that is is getting worse. Reports ear pain, throat pain, voice changing, needing to clear throat, and throat pain. Reports she has appointment in a couple months. Patient reports difficulty swallowing.

## 2016-12-13 NOTE — Discharge Instructions (Signed)
Continue to call your ear nose and throat provider and try to get in earlier. We do not have the ability to further evaluate this condition here in the urgent care. I have prescribed a medicine for your symptoms, called Magic mouthwash, swish and swallow 3 times a day as needed for pain. If symptoms worsen, go to the emergency room

## 2016-12-13 NOTE — Telephone Encounter (Signed)
Left a vm for patient to callback 

## 2016-12-13 NOTE — Progress Notes (Signed)
Cardiology referral placed. Patient was seen in the ED for chest pain and shortness of breath recently on 3 separate occasions. Referral was not placed by ED. Placing referral.

## 2016-12-19 ENCOUNTER — Emergency Department (HOSPITAL_COMMUNITY)
Admission: EM | Admit: 2016-12-19 | Discharge: 2016-12-19 | Disposition: A | Discharge status: Home / Self Care | Payer: No Typology Code available for payment source | Attending: Emergency Medicine | Admitting: Emergency Medicine

## 2016-12-19 ENCOUNTER — Encounter (HOSPITAL_COMMUNITY): Payer: Self-pay

## 2016-12-19 ENCOUNTER — Emergency Department (HOSPITAL_COMMUNITY): Payer: Self-pay

## 2016-12-19 ENCOUNTER — Encounter (HOSPITAL_COMMUNITY): Payer: Self-pay | Admitting: Emergency Medicine

## 2016-12-19 ENCOUNTER — Emergency Department (HOSPITAL_COMMUNITY)
Admission: EM | Admit: 2016-12-19 | Discharge: 2016-12-19 | Disposition: A | Discharge status: Home / Self Care | Payer: Self-pay | Attending: Emergency Medicine | Admitting: Emergency Medicine

## 2016-12-19 DIAGNOSIS — Z79899 Other long term (current) drug therapy: Secondary | ICD-10-CM | POA: Insufficient documentation

## 2016-12-19 DIAGNOSIS — R21 Rash and other nonspecific skin eruption: Secondary | ICD-10-CM | POA: Insufficient documentation

## 2016-12-19 DIAGNOSIS — R51 Headache: Secondary | ICD-10-CM | POA: Insufficient documentation

## 2016-12-19 DIAGNOSIS — R0602 Shortness of breath: Secondary | ICD-10-CM | POA: Insufficient documentation

## 2016-12-19 DIAGNOSIS — R079 Chest pain, unspecified: Secondary | ICD-10-CM | POA: Insufficient documentation

## 2016-12-19 DIAGNOSIS — T7840XA Allergy, unspecified, initial encounter: Secondary | ICD-10-CM

## 2016-12-19 DIAGNOSIS — R0789 Other chest pain: Secondary | ICD-10-CM

## 2016-12-19 DIAGNOSIS — T781XXA Other adverse food reactions, not elsewhere classified, initial encounter: Secondary | ICD-10-CM | POA: Insufficient documentation

## 2016-12-19 LAB — I-STAT CHEM 8, ED
BUN: 6 mg/dL (ref 6–20)
Calcium, Ion: 1.21 mmol/L (ref 1.15–1.40)
Chloride: 103 mmol/L (ref 101–111)
Creatinine, Ser: 0.8 mg/dL (ref 0.44–1.00)
Glucose, Bld: 139 mg/dL — ABNORMAL HIGH (ref 65–99)
HCT: 34 % — ABNORMAL LOW (ref 36.0–46.0)
Hemoglobin: 11.6 g/dL — ABNORMAL LOW (ref 12.0–15.0)
Potassium: 4.2 mmol/L (ref 3.5–5.1)
Sodium: 141 mmol/L (ref 135–145)
TCO2: 27 mmol/L (ref 0–100)

## 2016-12-19 LAB — I-STAT TROPONIN, ED: Troponin i, poc: 0.01 ng/mL (ref 0.00–0.08)

## 2016-12-19 MED ORDER — PREDNISONE 20 MG PO TABS
60.0000 mg | ORAL_TABLET | Freq: Once | ORAL | Status: AC
  Administered 2016-12-19: 60 mg via ORAL
  Filled 2016-12-19: qty 3

## 2016-12-19 MED ORDER — IBUPROFEN 800 MG PO TABS
800.0000 mg | ORAL_TABLET | Freq: Once | ORAL | Status: AC
  Administered 2016-12-19: 800 mg via ORAL
  Filled 2016-12-19: qty 1

## 2016-12-19 MED ORDER — ALBUTEROL SULFATE (2.5 MG/3ML) 0.083% IN NEBU
2.5000 mg | INHALATION_SOLUTION | Freq: Once | RESPIRATORY_TRACT | Status: AC
  Administered 2016-12-19: 2.5 mg via RESPIRATORY_TRACT
  Filled 2016-12-19: qty 3

## 2016-12-19 MED ORDER — DIPHENHYDRAMINE HCL 25 MG PO CAPS
25.0000 mg | ORAL_CAPSULE | Freq: Once | ORAL | Status: AC
  Administered 2016-12-19: 25 mg via ORAL
  Filled 2016-12-19: qty 1

## 2016-12-19 MED ORDER — FAMOTIDINE 20 MG PO TABS
40.0000 mg | ORAL_TABLET | Freq: Once | ORAL | Status: AC
  Administered 2016-12-19: 40 mg via ORAL
  Filled 2016-12-19: qty 2

## 2016-12-19 MED ORDER — PREDNISONE 10 MG PO TABS: 20.0000 mg | ORAL_TABLET | Freq: Every day | ORAL | 0 refills | Status: DC

## 2016-12-19 MED ORDER — FAMOTIDINE 20 MG PO TABS: 20.0000 mg | ORAL_TABLET | Freq: Two times a day (BID) | ORAL | 0 refills | Status: DC

## 2016-12-19 MED FILL — ?PREDNISONE 10 MG TABLET: 10 | 7 days supply | Qty: 15 | Fill #0

## 2016-12-19 MED FILL — FAMOTIDINE 20 MG TABLET: 20 | 10 days supply | Qty: 20 | Fill #0

## 2016-12-19 NOTE — ED Triage Notes (Signed)
Pt reports eating a new dressing with a salad last night feeling "bad", pt states she now feels like she is having an allergic reaction, pt reports breaking out in hives and feeling like food what "going down slow". No visible hives, pt states she did take cup of Benadryl @ 1 hr ago. Pt continues to feel like she can not drink well.  Pt denies tongue swelling or shob at this time

## 2016-12-19 NOTE — Discharge Instructions (Signed)
Continue prescriptions from ED visit earlier. Follow-up with your primary care doctor. Return here for any new/worsening symptoms.

## 2016-12-19 NOTE — ED Notes (Signed)
Pt. Ambulated to bathroom & back to room 

## 2016-12-19 NOTE — Discharge Instructions (Signed)
You have been treated for food allergy Avoid this food in the future You have been given 2 prescriptions One for Pepcid please take this regularly for 2 day than as needed The second for prednisone.  Please take this as directed until all tablets have been completed

## 2016-12-19 NOTE — ED Notes (Signed)
ED Provider at bedside for assessment.  

## 2016-12-19 NOTE — ED Notes (Signed)
Patient transported to X-ray  after blood draw

## 2016-12-19 NOTE — ED Notes (Signed)
Pt verbalized understanding of discharge instructions and denies any further questions at this time.   

## 2016-12-19 NOTE — ED Notes (Signed)
Pt ambulated to room and then to restroom, pt ambulated with no complaints and a steady gait. Pt placed on monitor.

## 2016-12-19 NOTE — ED Notes (Signed)
Pt is back from x-ray

## 2016-12-19 NOTE — ED Notes (Signed)
ED Provider at bedside. 

## 2016-12-19 NOTE — ED Triage Notes (Signed)
Pt was here this morning for allergic reaction and discharged. Pt states once she was home she began having shortness of breath while lying with chest tightness. Pt was concerned for rxn to medications. Skin warm and dry, no distress noted. Lung sounds clear.

## 2016-12-19 NOTE — ED Provider Notes (Signed)
[facility] Provider Note   CSN: [id number] Arrival date & time: 12/19/16  0255     History   Chief Complaint Chief Complaint  Patient presents with  . Allergic Reaction    HPI [name] is a 41 y.o. female.  This 41 year old female who presents with an allergic reaction to food.  She states last night she had salad dressing and had similar type symptoms only with worsening chest pain.  [facility] and discharge home, but not treated for an allergic reaction.  Tonight she had the same salad dressing and developed throat tightening some chest discomfort, headache, and hives.  She did take  Benadryl , which helped some of her symptoms.  She still has a feeling of her throat closing and discomfort in her chest when she swallows.  She states the hives have disappeared      Past Medical History:  Diagnosis Date  . Anemia   . Constipation   . GERD (gastroesophageal reflux disease)   . Hx of endometriosis   . Macromastia 07/2011  . Migraine     Patient Active Problem List   Diagnosis Date Noted  . Gastroesophageal reflux disease 06/27/2016  . Disturbance of skin sensation 04/27/2014  . Dizziness and giddiness 03/14/2014  . Headache, migraine 03/14/2014  . Myalgia and myositis, unspecified 06/07/2013  . Nausea and vomiting in adult 08/22/2012  . Viral syndrome 08/22/2012  . Pelvic pain in female 12/18/2010  . PID (acute pelvic inflammatory disease) 12/18/2010  . ANEMIA-IRON DEFICIENCY 10/15/2006  . HERPES, GENITAL NOS 09/30/2006  . OBESITY NOS 09/30/2006  . DISORDER, NONORGANIC SLEEP NOS 09/30/2006  . CONSTIPATION NOS 09/30/2006  . NEURITIS, LUMBOSACRAL NOS 09/30/2006  . HX, PERSONAL, MENTAL DISORDER NOS 09/30/2006    Past Surgical History:  Procedure Laterality Date  . ABDOMINAL HYSTERECTOMY  12/12/2003  . ADENOIDECTOMY  05/2011  . ANKLE ARTHROSCOPY  12/29/2007   right; with extensive debridement  . BLADDER NECK RECONSTRUCTION  01/14/2011   Procedure: BLADDER NECK REPAIR;  Surgeon: Reva Boresanya S Pratt, MD;  Location: [facility];  Service: Gynecology;  Laterality: N/A;  Laparoscopic Repair of Incidental Cystotomy  . BREAST REDUCTION SURGERY  08/05/2011   Procedure: MAMMARY REDUCTION  (BREAST);  Surgeon: Louisa SecondGerald Truesdale, MD;  Location: [facility];  Service: Plastics;  Laterality: Bilateral;  bilateral  . CESAREAN SECTION  12/29/2000; 1994  . DILATION AND CURETTAGE OF UTERUS  07/08/2003   open laparoscopy  . LAPAROSCOPIC SALPINGOOPHERECTOMY  01/14/2011   left  . REPAIR PERONEAL TENDONS ANKLE  01/03/2010   repair right subluxing peroneal tendons  . RIGHT OOPHORECTOMY     with lysis of adhesions  . SALPINGECTOMY  12/12/2003   right; with TAH  . TONSILLECTOMY  as a child  . TUBAL LIGATION  12/29/2000    OB History    Gravida Para Term Preterm AB Living   2 2 2     2    SAB TAB Ectopic Multiple Live Births                   Home Medications    Prior to Admission medications   Medication Sig Start Date End Date Taking? Authorizing Provider  acetaminophen (TYLENOL) 500 MG tablet Take 1 tablet (500 mg total) by mouth every 6 (six) hours as needed for mild pain (pain). 07/24/16   Massie MaroonHollis, Lachina M, FNP  albuterol (PROVENTIL HFA;VENTOLIN HFA) 108 (90 Base) MCG/ACT inhaler Inhale 1-2 puffs into the lungs every  6 (six) hours as needed for wheezing or shortness of breath (cough). 12/05/16   Bing Neighbors, FNP  cetirizine (ZYRTEC) 10 MG tablet Take 1 tablet (10 mg total) by mouth daily. Patient taking differently: Take 10 mg by mouth daily as needed for allergies.  07/24/16   Massie Maroon, FNP  diclofenac (VOLTAREN) 50 MG EC tablet Take 1 tablet (50 mg total) by mouth 2 (two) times daily. Patient not taking: Reported on 12/10/2016 11/16/16   Janne Napoleon, NP  docusate sodium (COLACE) 100 MG capsule Take 100 mg by mouth 2 (two) times daily as needed for moderate constipation.     [provider]  esomeprazole (NEXIUM) 40 MG  capsule Take 1 capsule (40 mg total) by mouth every morning. 06/26/16   Massie Maroon, FNP  famotidine (PEPCID) 20 MG tablet Take 1 tablet (20 mg total) by mouth 2 (two) times daily. 12/19/16   Earley Favor, NP  fluticasone Aleda Grana) 50 MCG/ACT nasal spray Place 2 sprays into both nostrils daily. 11/25/16   Horton, Mayer Masker, MD  ibuprofen (ADVIL,MOTRIN) 600 MG tablet Take 1 tablet (600 mg total) by mouth every 6 (six) hours as needed. 11/09/16   Fayrene Helper, PA-C  magic mouthwash w/lidocaine SOLN Take 5 mLs by mouth 3 (three) times daily as needed for mouth pain. 12/13/16   Dorena Bodo, NP  methocarbamol (ROBAXIN) 500 MG tablet Take 1 tablet (500 mg total) by mouth 2 (two) times daily. 11/16/16   Janne Napoleon, NP  ondansetron (ZOFRAN) 4 MG tablet Take 1 tablet (4 mg total) by mouth every 8 (eight) hours as needed for nausea or vomiting. 06/26/16   Massie Maroon, FNP  phenazopyridine (PYRIDIUM) 200 MG tablet Take 1 tablet (200 mg total) by mouth 3 (three) times daily. 11/16/16   Janne Napoleon, NP  predniSONE (DELTASONE) 10 MG tablet Take 2 tablets (20 mg total) by mouth daily. 12/19/16   Earley Favor, NP  promethazine (PHENERGAN) 25 MG tablet Take 1 tablet (25 mg total) by mouth every 6 (six) hours as needed for nausea or vomiting. 03/18/16   Renne Crigler, PA-C  sodium chloride (OCEAN) 0.65 % SOLN nasal spray Place 1 spray into both nostrils as needed for congestion. 11/25/16   Horton, Mayer Masker, MD  zolpidem (AMBIEN) 5 MG tablet Take 1 tablet (5 mg total) by mouth at bedtime as needed for sleep. 12/05/16   Bing Neighbors, FNP    Family History Family History  Problem Relation Age of Onset  . Diabetes Mother   . Hypertension Mother   . Thyroid disease Sister     Social History Social History  Substance Use Topics  . Smoking status: Never Smoker  . Smokeless tobacco: Never Used  . Alcohol use No     Allergies   Other   Review of Systems Review of Systems  Constitutional:  Negative for fever.  HENT: Positive for trouble swallowing.   Respiratory: Negative for shortness of breath.   Cardiovascular: Positive for chest pain.  Skin: Positive for rash.  Neurological: Positive for headaches.  All other systems reviewed and are negative.    Physical Exam Updated Vital Signs BP (!) 169/98 (BP Location: Right Arm)   Pulse 88   Temp 98.4 F (36.9 C) (Oral)   Resp 16   Ht 5\' 4"  (1.626 m)   Wt 95.3 kg (210 lb)   LMP 12/27/2003   SpO2 100%   BMI 36.05 kg/m   Physical Exam  Constitutional: She appears well-developed and well-nourished.  HENT:  Head: Normocephalic.  Mouth/Throat: Oropharynx is clear and moist.  Eyes: Pupils are equal, round, and reactive to light.  Neck: Normal range of motion.  Cardiovascular: Normal rate and regular rhythm.   Pulmonary/Chest: Effort normal and breath sounds normal. She has no wheezes. She exhibits no tenderness.  Neurological: She is alert.  Skin: Skin is warm and dry. No rash noted.  Psychiatric: She has a normal mood and affect.  Nursing note and vitals reviewed.    ED Treatments / Results  Labs (all labs ordered are listed, but only abnormal results are displayed) Labs Reviewed - No data to display  EKG  EKG Interpretation None       Radiology No results found.  Procedures Procedures (including critical care time)  Medications Ordered in ED Medications  famotidine (PEPCID) tablet 40 mg (40 mg Oral Given 12/19/16 0447)  predniSONE (DELTASONE) tablet 60 mg (60 mg Oral Given 12/19/16 0447)  albuterol (PROVENTIL) (2.5 MG/3ML) 0.083% nebulizer solution 2.5 mg (2.5 mg Nebulization Given 12/19/16 0448)     Initial Impression / Assessment and Plan / ED Course  I have reviewed the triage vital signs and the nursing notes.  Pertinent labs & imaging results that were available during my care of the patient were reviewed by me and considered in my medical decision making (see chart for details).     Will  treat patient for allergic reaction which will include prednisone, Pepcid, no pedal treatment Improvement seen in the ED after treatment of albuterol, Pepcid, and prednisone  Final Clinical Impressions(s) / ED Diagnoses   Final diagnoses:  Allergic reaction, initial encounter    New Prescriptions New Prescriptions   FAMOTIDINE (PEPCID) 20 MG TABLET    Take 1 tablet (20 mg total) by mouth 2 (two) times daily.   PREDNISONE (DELTASONE) 10 MG TABLET    Take 2 tablets (20 mg total) by mouth daily.     Earley Favor, NP 12/19/16 3664    Zadie Rhine, MD 12/19/16 [phone]

## 2016-12-19 NOTE — ED Provider Notes (Signed)
[facility] Provider Note   CSN: [id number] Arrival date & time: 12/19/16  0825     History   Chief Complaint Chief Complaint  Patient presents with  . Shortness of Breath    HPI [name] is a 41 y.o. female.  The history is provided by the patient and medical records.  Shortness of Breath     41 year old female with history of anemia, constipation, GERD, migraine headaches, presenting to the ED with chest tightness and shortness of breath. Patient was seen in the ED this morning or an allergic reaction. States she tried a new salad dressing, French/Catalina mix with spicy bacon bits. States this is the second time she's had a reaction to the salad dressing. While here she was here she was treated with Pepcid, albuterol, and prednisone. She had taken Benadryl prior to arrival. States symptoms improved here when she returned home she felt somewhat "jittery" in her hands and feet with some chest tightness and SOB.  States she has had all of these medications in the past, does not feel like that is the cause. States she called the hospital and told him about her symptoms and was told to come back.  States she has been able to drink water since leaving the hospital without issue but has not had any solid food. States she feels like there is a "lump" in her throat. She denies any fever or chills.  No cardiac hx.  Not a smoker.  Patient requesting additional benadryl during evaluation.  Past Medical History:  Diagnosis Date  . Anemia   . Constipation   . GERD (gastroesophageal reflux disease)   . Hx of endometriosis   . Macromastia 07/2011  . Migraine     Patient Active Problem List   Diagnosis Date Noted  . Gastroesophageal reflux disease 06/27/2016  . Disturbance of skin sensation 04/27/2014  . Dizziness and giddiness 03/14/2014  . Headache, migraine 03/14/2014  . Myalgia and myositis, unspecified 06/07/2013  . Nausea and vomiting in adult 08/22/2012  . Viral  syndrome 08/22/2012  . Pelvic pain in female 12/18/2010  . PID (acute pelvic inflammatory disease) 12/18/2010  . ANEMIA-IRON DEFICIENCY 10/15/2006  . HERPES, GENITAL NOS 09/30/2006  . OBESITY NOS 09/30/2006  . DISORDER, NONORGANIC SLEEP NOS 09/30/2006  . CONSTIPATION NOS 09/30/2006  . NEURITIS, LUMBOSACRAL NOS 09/30/2006  . HX, PERSONAL, MENTAL DISORDER NOS 09/30/2006    Past Surgical History:  Procedure Laterality Date  . ABDOMINAL HYSTERECTOMY  12/12/2003  . ADENOIDECTOMY  05/2011  . ANKLE ARTHROSCOPY  12/29/2007   right; with extensive debridement  . BLADDER NECK RECONSTRUCTION  01/14/2011   Procedure: BLADDER NECK REPAIR;  Surgeon: Reva Bores, MD;  Location: [facility];  Service: Gynecology;  Laterality: N/A;  Laparoscopic Repair of Incidental Cystotomy  . BREAST REDUCTION SURGERY  08/05/2011   Procedure: MAMMARY REDUCTION  (BREAST);  Surgeon: Louisa Second, MD;  Location: [facility];  Service: Plastics;  Laterality: Bilateral;  bilateral  . CESAREAN SECTION  12/29/2000; 1994  . DILATION AND CURETTAGE OF UTERUS  07/08/2003   open laparoscopy  . LAPAROSCOPIC SALPINGOOPHERECTOMY  01/14/2011   left  . REPAIR PERONEAL TENDONS ANKLE  01/03/2010   repair right subluxing peroneal tendons  . RIGHT OOPHORECTOMY     with lysis of adhesions  . SALPINGECTOMY  12/12/2003   right; with TAH  . TONSILLECTOMY  as a child  . TUBAL LIGATION  12/29/2000    OB History    Gravida Para  Term Preterm AB Living   2 2 2     2    SAB TAB Ectopic Multiple Live Births                   Home Medications    Prior to Admission medications   Medication Sig Start Date End Date Taking? Authorizing Provider  acetaminophen (TYLENOL) 500 MG tablet Take 1 tablet (500 mg total) by mouth every 6 (six) hours as needed for mild pain (pain). 07/24/16   Massie Maroon, FNP  albuterol (PROVENTIL HFA;VENTOLIN HFA) 108 (90 Base) MCG/ACT inhaler Inhale 1-2 puffs into the lungs every 6 (six) hours as needed  for wheezing or shortness of breath (cough). 12/05/16   Bing Neighbors, FNP  cetirizine (ZYRTEC) 10 MG tablet Take 1 tablet (10 mg total) by mouth daily. Patient taking differently: Take 10 mg by mouth daily as needed for allergies.  07/24/16   Massie Maroon, FNP  diclofenac (VOLTAREN) 50 MG EC tablet Take 1 tablet (50 mg total) by mouth 2 (two) times daily. Patient not taking: Reported on 12/10/2016 11/16/16   Janne Napoleon, NP  docusate sodium (COLACE) 100 MG capsule Take 100 mg by mouth 2 (two) times daily as needed for moderate constipation.     [provider]  esomeprazole (NEXIUM) 40 MG capsule Take 1 capsule (40 mg total) by mouth every morning. 06/26/16   Massie Maroon, FNP  famotidine (PEPCID) 20 MG tablet Take 1 tablet (20 mg total) by mouth 2 (two) times daily. 12/19/16   Earley Favor, NP  fluticasone Aleda Grana) 50 MCG/ACT nasal spray Place 2 sprays into both nostrils daily. 11/25/16   Horton, Mayer Masker, MD  ibuprofen (ADVIL,MOTRIN) 600 MG tablet Take 1 tablet (600 mg total) by mouth every 6 (six) hours as needed. 11/09/16   Fayrene Helper, PA-C  magic mouthwash w/lidocaine SOLN Take 5 mLs by mouth 3 (three) times daily as needed for mouth pain. 12/13/16   Dorena Bodo, NP  methocarbamol (ROBAXIN) 500 MG tablet Take 1 tablet (500 mg total) by mouth 2 (two) times daily. 11/16/16   Janne Napoleon, NP  ondansetron (ZOFRAN) 4 MG tablet Take 1 tablet (4 mg total) by mouth every 8 (eight) hours as needed for nausea or vomiting. 06/26/16   Massie Maroon, FNP  phenazopyridine (PYRIDIUM) 200 MG tablet Take 1 tablet (200 mg total) by mouth 3 (three) times daily. 11/16/16   Janne Napoleon, NP  predniSONE (DELTASONE) 10 MG tablet Take 2 tablets (20 mg total) by mouth daily. 12/19/16   Earley Favor, NP  promethazine (PHENERGAN) 25 MG tablet Take 1 tablet (25 mg total) by mouth every 6 (six) hours as needed for nausea or vomiting. 03/18/16   Renne Crigler, PA-C  sodium chloride (OCEAN) 0.65 %  SOLN nasal spray Place 1 spray into both nostrils as needed for congestion. 11/25/16   Horton, Mayer Masker, MD  zolpidem (AMBIEN) 5 MG tablet Take 1 tablet (5 mg total) by mouth at bedtime as needed for sleep. 12/05/16   Bing Neighbors, FNP    Family History Family History  Problem Relation Age of Onset  . Diabetes Mother   . Hypertension Mother   . Thyroid disease Sister     Social History Social History  Substance Use Topics  . Smoking status: Never Smoker  . Smokeless tobacco: Never Used  . Alcohol use No     Allergies   Other   Review of Systems Review  of Systems  Respiratory: Positive for chest tightness and shortness of breath.   All other systems reviewed and are negative.    Physical Exam Updated Vital Signs BP (!) 145/104 (BP Location: Right Arm)   Pulse 87   Temp 98.9 F (37.2 C) (Oral)   Resp 17   LMP 12/27/2003   SpO2 99%   Physical Exam  Constitutional: She is oriented to person, place, and time. She appears well-developed and well-nourished.  HENT:  Head: Normocephalic and atraumatic.  Mouth/Throat: Oropharynx is clear and moist.  No oral swelling or airway compromise; handling secretions well, normal phonation without stridor; no facial or neck swelling  Eyes: Pupils are equal, round, and reactive to light. Conjunctivae and EOM are normal.  Neck: Normal range of motion.  Cardiovascular: Normal rate, regular rhythm and normal heart sounds.   Pulmonary/Chest: Effort normal and breath sounds normal. She has no wheezes. She has no rhonchi.  Lungs clear, O2 sats 98% during exam, no audible wheezes  Abdominal: Soft. Bowel sounds are normal.  Musculoskeletal: Normal range of motion.  Neurological: She is alert and oriented to person, place, and time.  Skin: Skin is warm and dry. No rash noted.  No apparent rash  Psychiatric: She has a normal mood and affect.  Nursing note and vitals reviewed.    ED Treatments / Results  Labs (all labs ordered  are listed, but only abnormal results are displayed) Labs Reviewed  I-STAT CHEM 8, ED - Abnormal; Notable for the following:       Result Value   Glucose, Bld 139 (*)    Hemoglobin 11.6 (*)    HCT 34.0 (*)    All other components within normal limits  I-STAT TROPONIN, ED    EKG  EKG Interpretation None       Radiology Dg Neck Soft Tissue  Result Date: 12/19/2016 CLINICAL DATA:  Tightness, possible foreign body EXAM: NECK SOFT TISSUES - 1+ VIEW COMPARISON:  None. FINDINGS: There is no evidence of retropharyngeal soft tissue swelling or epiglottic enlargement. The cervical airway is unremarkable and no radio-opaque foreign body identified. Very mild degenerative changes are noted at C6-7. IMPRESSION: No acute abnormality noted. Electronically Signed   By: Alcide CleverMark  Lukens M.D.   On: 12/19/2016 12:10   Dg Chest 2 View  Result Date: 12/19/2016 CLINICAL DATA:  Chest tightness EXAM: CHEST  2 VIEW COMPARISON:  12/09/2016 FINDINGS: The heart size and mediastinal contours are within normal limits. Both lungs are clear. The visualized skeletal structures are unremarkable. IMPRESSION: No active cardiopulmonary disease. Electronically Signed   By: Alcide CleverMark  Lukens M.D.   On: 12/19/2016 12:09    Procedures Procedures (including critical care time)  Medications Ordered in ED Medications  diphenhydrAMINE (BENADRYL) capsule 25 mg (25 mg Oral Given 12/19/16 1144)  ibuprofen (ADVIL,MOTRIN) tablet 800 mg (800 mg Oral Given 12/19/16 1425)     Initial Impression / Assessment and Plan / ED Course  I have reviewed the triage vital signs and the nursing notes.  Pertinent labs & imaging results that were available during my care of the patient were reviewed by me and considered in my medical decision making (see chart for details).  41 year old female here with recurrent symptoms of allergic reaction. She was treated here after reaction to salad dressing. States when she went home she began treating some  shortness of breath and chest tightness. She called the ED was told to come back by a nurse. On arrival, patient is awake, alert, in  no acute distress. Her lung sounds are clear. Vitals are stable. Airway remains patent, no signs of oral swelling. No rashes. Patient states symptoms have started dissipating since she arrived here, but she just wanted to do what she was told. States she does feel like there is a small "lump" in her throat. She is not sure if there is a piece of food stuck in there. She's been drinking water without issue but has not tried to eat solid food. We'll plan for screening chest x-ray, films of soft tissue neck. Screening chem 8 and troponin. EKG with sinus tach, heart rate 105. Patient requesting Benadryl, given.  Patient's labs overall reassuring. Troponin is negative. X-rays are negative as well. Patient has remained stable here. Her tachycardia has resolved without any acute intervention. She has not required any additional medications here.  She remains without any facial swelling, respiratory distress, or airway compromise.  Continues tolerating PO without issue. At this point, feel she is stable for discharge. She was given prescriptions for prednisone taper earlier this morning, will have her continue this. Can continue Benadryl every 6-8 hours as needed as well. I recommended that she follow-up with her primary care doctor.  Discussed plan with patient, she acknowledged understanding and agreed with plan of care.  Return precautions given for new or worsening symptoms.  Final Clinical Impressions(s) / ED Diagnoses   Final diagnoses:  SOB (shortness of breath)  Chest tightness    New Prescriptions Discharge Medication List as of 12/19/2016  2:24 PM       Garlon Hatchet, PA-C 12/19/16 1501    Vanetta Mulders, MD 12/20/16 [phone]

## 2016-12-23 ENCOUNTER — Ambulatory Visit (INDEPENDENT_AMBULATORY_CARE_PROVIDER_SITE_OTHER): Payer: Self-pay | Admitting: Family Medicine

## 2016-12-23 ENCOUNTER — Encounter: Payer: Self-pay | Admitting: Family Medicine

## 2016-12-23 VITALS — BP 130/88 | HR 75 | Temp 98.4°F | Resp 16 | Ht 64.0 in | Wt 209.0 lb

## 2016-12-23 DIAGNOSIS — R202 Paresthesia of skin: Secondary | ICD-10-CM

## 2016-12-23 DIAGNOSIS — M79641 Pain in right hand: Secondary | ICD-10-CM

## 2016-12-23 DIAGNOSIS — R768 Other specified abnormal immunological findings in serum: Secondary | ICD-10-CM

## 2016-12-23 DIAGNOSIS — R200 Anesthesia of skin: Secondary | ICD-10-CM

## 2016-12-23 DIAGNOSIS — R21 Rash and other nonspecific skin eruption: Secondary | ICD-10-CM

## 2016-12-23 NOTE — Patient Instructions (Signed)
Keep appointment with Cardiology and Gastroenterology.  Continue Robaxin at bedtime for muscle pain and hand pain.  I recommend counseling to assist you with steps to coping with your overall health state. You can contact [facility] at [phone] to obtain an appointment,  We will re-refer you to dermatology for evaluation of hair loss.

## 2016-12-23 NOTE — Progress Notes (Signed)
Patient ID: [name], female    DOB: [birth year], 41 y.o.   MRN: [id number]  PCP: Bing Neighbors, FNP  Chief Complaint  Patient presents with  . Numbness    right side hand, leg,   . throat swelling    Subjective:  HPI [name] is a 41 y.o. female presents for evaluation of multiple complaints. [place] has been evaluated at multiple emergency department for various complaints including chest pain, arm numbness, trouble swallowing, hives, right hand numbness and tingling, and right arm pain. After a negative cardiovascular work-up multiple times, she has since been referred to cardiology and has an upcoming appointment scheduled with cardiology. She continue to complain of chest pain and now worsening right arm pain. She was written out of work by the emergency department provider on 12/22/2016 after she presented to  [facility] ED complaining of shortness of breath and chest tightness. During that visit she was worked up and deemed stable, discharged home to follow-up with PCP. She is concern today as she reports continued outbreak of hives since discharge from hospital. She now complains of emotional stress and feels that she is "going crazy" as no one can tell her what's wrong with her. She is concern that she is experiencing symptoms of lupus as she reports hair loss and generalized joint pain. Right hand and arm pain is new and has been ongoing for over 1 week. She has not attempted relief with any medication. Margree is concern with her ability to drive as she now experiencing arm pain. 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 Social History   Social History  . Marital status: Divorced    Spouse name: [name]  . Number of children: 2  . Years of education: 12th   Occupational History  .  Other    [employer]   Social History Main Topics  . Smoking status: Never Smoker  . Smokeless tobacco: Never Used  . Alcohol use No  . Drug  use: No  . Sexual activity: Not on file   Other Topics Concern  . Not on file   Social History Narrative   Patient lives at home with family.   Caffeine Use: 1 cup daily    Family History  Problem Relation Age of Onset  . Diabetes Mother   . Hypertension Mother   . Thyroid disease Sister      Review of Systems  Patient Active Problem List   Diagnosis Date Noted  . Gastroesophageal reflux disease 06/27/2016  . Disturbance of skin sensation 04/27/2014  . Dizziness and giddiness 03/14/2014  . Headache, migraine 03/14/2014  . Myalgia and myositis, unspecified 06/07/2013  . Nausea and vomiting in adult 08/22/2012  . Viral syndrome 08/22/2012  . Pelvic pain in female 12/18/2010  . PID (acute pelvic inflammatory disease) 12/18/2010  . ANEMIA-IRON DEFICIENCY 10/15/2006  . HERPES, GENITAL NOS 09/30/2006  . OBESITY NOS 09/30/2006  . DISORDER, NONORGANIC SLEEP NOS 09/30/2006  . CONSTIPATION NOS 09/30/2006  . NEURITIS, LUMBOSACRAL NOS 09/30/2006  . HX, PERSONAL, MENTAL DISORDER NOS 09/30/2006    No Known Allergies  Prior to Admission medications   Medication Sig Start Date End Date Taking? Authorizing Provider  acetaminophen (TYLENOL) 500 MG tablet Take 1 tablet (500 mg total) by mouth every 6 (six) hours as needed for mild pain (pain). 07/24/16  Yes Massie Maroon, FNP  albuterol (PROVENTIL HFA;VENTOLIN HFA) 108 (90 Base) MCG/ACT inhaler  Inhale 1-2 puffs into the lungs every 6 (six) hours as needed for wheezing or shortness of breath (cough). 12/05/16  Yes Bing Neighbors, FNP  cetirizine (ZYRTEC) 10 MG tablet Take 1 tablet (10 mg total) by mouth daily. 07/24/16  Yes Massie Maroon, FNP  diclofenac (VOLTAREN) 50 MG EC tablet Take 1 tablet (50 mg total) by mouth 2 (two) times daily. 11/16/16  Yes Neese, Hope M, NP  docusate sodium (COLACE) 100 MG capsule Take 100 mg by mouth 2 (two) times daily as needed for moderate constipation.    Yes [provider]   esomeprazole (NEXIUM) 40 MG capsule Take 1 capsule (40 mg total) by mouth every morning. 06/26/16  Yes Massie Maroon, FNP  famotidine (PEPCID) 20 MG tablet Take 1 tablet (20 mg total) by mouth 2 (two) times daily. 12/19/16  Yes Earley Favor, NP  fluticasone [facility]) 50 MCG/ACT nasal spray Place 2 sprays into both nostrils daily. Patient taking differently: Place 2 sprays into both nostrils 4 (four) times a week.  11/25/16  Yes Horton, Mayer Masker, MD  ibuprofen (ADVIL,MOTRIN) 600 MG tablet Take 1 tablet (600 mg total) by mouth every 6 (six) hours as needed. 11/09/16  Yes Fayrene Helper, PA-C  methocarbamol (ROBAXIN) 500 MG tablet Take 1 tablet (500 mg total) by mouth 2 (two) times daily. 11/16/16  Yes Neese, Hope M, NP  ondansetron (ZOFRAN) 4 MG tablet Take 1 tablet (4 mg total) by mouth every 8 (eight) hours as needed for nausea or vomiting. 06/26/16  Yes Massie Maroon, FNP  phenazopyridine (PYRIDIUM) 200 MG tablet Take 1 tablet (200 mg total) by mouth 3 (three) times daily. Patient taking differently: Take 200 mg by mouth 3 (three) times daily as needed for pain.  11/16/16  Yes Neese, Hope M, NP  promethazine (PHENERGAN) 25 MG tablet Take 1 tablet (25 mg total) by mouth every 6 (six) hours as needed for nausea or vomiting. 03/18/16  Yes Renne Crigler, PA-C  sodium chloride (OCEAN) 0.65 % SOLN nasal spray Place 1 spray into both nostrils as needed for congestion. 11/25/16  Yes Horton, Mayer Masker, MD  zolpidem (AMBIEN) 5 MG tablet Take 1 tablet (5 mg total) by mouth at bedtime as needed for sleep. 12/05/16  Yes Bing Neighbors, FNP  magic mouthwash w/lidocaine SOLN Take 5 mLs by mouth 3 (three) times daily as needed for mouth pain. Patient not taking: Reported on 12/23/2016 12/13/16   Dorena Bodo, NP  predniSONE (DELTASONE) 10 MG tablet Take 2 tablets (20 mg total) by mouth daily. Patient not taking: Reported on 12/23/2016 12/19/16   Earley Favor, NP    Past Medical, Surgical Family and Social  History reviewed and updated.    Objective:   Today's Vitals   12/23/16 1419  BP: 130/88  Pulse: 75  Resp: 16  Temp: 98.4 F (36.9 C)  TempSrc: Oral  SpO2: 100%  Weight: 209 lb (94.8 kg)  Height: 5\' 4"  (1.626 m)    Wt Readings from Last 3 Encounters:  12/23/16 209 lb (94.8 kg)  12/19/16 210 lb (95.3 kg)  12/10/16 210 lb (95.3 kg)    Physical Exam  Constitutional: She is oriented to person, place, and time. She appears well-developed and well-nourished.  HENT:  Head: Normocephalic and atraumatic.  Mouth/Throat: Oropharynx is clear and moist.  Eyes: Pupils are equal, round, and reactive to light. Conjunctivae and EOM are normal.  Neck: Neck supple. No thyromegaly present.  Cardiovascular: Normal rate, normal heart sounds  and intact distal pulses.   Pulmonary/Chest: Effort normal and breath sounds normal.  Abdominal: Soft.  Musculoskeletal: Normal range of motion. She exhibits tenderness.  Lymphadenopathy:    She has no cervical adenopathy.  Neurological: She is alert and oriented to person, place, and time.  Skin: Skin is warm and dry.  Psychiatric: She has a normal mood and affect. Her behavior is normal. Thought content normal.   Assessment & Plan:  1. Right arm numb, tenderness with palpation. Negative for erythema or impaired ROM. COMPLETE METABOLIC PANEL WITH GFR - Systemic lupus panel-comprehensive  2. Right hand pain, tenderness with palpation. Negative for erythema or impaired ROM. - Systemic lupus panel-comprehensive - Sedimentation rate  3. Skin rash, skin complexion and character appear consistently bilaterally and there was no evidence of hives or rash present today. Will evaluate a sed rate and SLE panel   - COMPLETE METABOLIC PANEL WITH GFR - Systemic lupus panel-comprehensive - Sedimentation rate   RTC: 3 months for follow-up chronic conditions    Godfrey Pick. Tiburcio Pea, MSN, FNP-C [facility]  [address]  Sherian Maroon [place], [place] [address] [phone]

## 2016-12-24 LAB — COMPLETE METABOLIC PANEL WITH GFR
ALT: 12 U/L (ref 6–29)
AST: 17 U/L (ref 10–30)
Albumin: 3.9 g/dL (ref 3.6–5.1)
Alkaline Phosphatase: 59 U/L (ref 33–115)
BUN: 8 mg/dL (ref 7–25)
CO2: 24 mmol/L (ref 20–32)
Calcium: 8.8 mg/dL (ref 8.6–10.2)
Chloride: 102 mmol/L (ref 98–110)
Creat: 0.77 mg/dL (ref 0.50–1.10)
GFR, Est African American: >89 mL/min (ref >=60)
GFR, Est Non African American: >89 mL/min (ref >=60)
Glucose, Bld: 86 mg/dL (ref 65–99)
Potassium: 4.9 mmol/L (ref 3.5–5.3)
Sodium: 139 mmol/L (ref 135–146)
Total Bilirubin: 0.3 mg/dL (ref 0.2–1.2)
Total Protein: 6.8 g/dL (ref 6.1–8.1)

## 2016-12-24 LAB — SEDIMENTATION RATE

## 2016-12-25 ENCOUNTER — Ambulatory Visit: Payer: No Typology Code available for payment source | Admitting: Family Medicine

## 2016-12-25 LAB — ANTI-NUCLEAR AB-TITER (ANA TITER): ANA Titer 1: 1:80 {titer} — ABNORMAL HIGH

## 2016-12-27 LAB — LUPUS(12) PANEL
Anti Nuclear Antibody(ANA): POSITIVE — AB
C3 Complement: 131 mg/dL (ref 83–193)
C4 Complement: 31 mg/dL (ref 15–57)
ENA SM Ab Ser-aCnc: <1
Rhuematoid fact SerPl-aCnc: <14 IU/mL (ref <14)
Ribosomal P Protein Ab: <1
SM/RNP: <1
SSA (Ro) (ENA) Antibody, IgG: <1
SSB (La) (ENA) Antibody, IgG: <1
Scleroderma (Scl-70) (ENA) Antibody, IgG: <1
Thyroperoxidase Ab SerPl-aCnc: 1 IU/mL (ref <9)
ds DNA Ab: 1 IU/mL

## 2016-12-30 ENCOUNTER — Encounter: Payer: Self-pay | Admitting: Family Medicine

## 2016-12-30 ENCOUNTER — Ambulatory Visit (INDEPENDENT_AMBULATORY_CARE_PROVIDER_SITE_OTHER): Payer: Self-pay | Admitting: Family Medicine

## 2016-12-30 VITALS — BP 134/77 | HR 90 | Temp 97.8°F | Resp 16 | Ht 64.0 in | Wt 212.0 lb

## 2016-12-30 DIAGNOSIS — R21 Rash and other nonspecific skin eruption: Secondary | ICD-10-CM

## 2016-12-30 DIAGNOSIS — R7689 Other specified abnormal immunological findings in serum: Secondary | ICD-10-CM

## 2016-12-30 DIAGNOSIS — R52 Pain, unspecified: Secondary | ICD-10-CM

## 2016-12-30 DIAGNOSIS — R768 Other specified abnormal immunological findings in serum: Secondary | ICD-10-CM | POA: Insufficient documentation

## 2016-12-30 DIAGNOSIS — R5383 Other fatigue: Secondary | ICD-10-CM

## 2016-12-30 MED ORDER — TRIAMCINOLONE ACETONIDE 0.1 % EX CREA: 1.0000 "application " | TOPICAL_CREAM | Freq: Two times a day (BID) | CUTANEOUS | 0 refills | Status: DC

## 2016-12-30 MED FILL — TRIAMCINOLONE 0.1% CREAM: 0.1 | 15 days supply | Qty: 30 | Fill #0

## 2016-12-30 NOTE — Patient Instructions (Addendum)
Apply triamcinolone cream twice daily to face for rash.

## 2016-12-30 NOTE — Progress Notes (Signed)
Patient ID: [name], female    DOB: July 27, 1975, 41 y.o.   MRN: [id number]  PCP: Bing Neighbors, FNP  Chief Complaint  Patient presents with  . Follow-up    labs and symptoms     Subjective:  HPI [name] is a 41 y.o. female presents for evaluation of labs.  Eddy has been evaluated at multiple emergency department over the last 2 months for various complaints including chest pain, arm numbness, trouble swallowing, hives, hair loss, dysphagia right hand numbness and tingling, and right arm pain. After a negative cardiovascular work-up multiple times, she has since been referred to cardiology and has an upcoming appointment scheduled with cardiology. 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  She was taken out of work on 12/24/2016 and presents today wanting to return to work. Also during her last office visit she requested to be screened for lupus. Lupus panel returned a positive ANA, ANA titer was 1:80. Social History   Social History  . Marital status: Divorced    Spouse name: N/A  . Number of children: 2  . Years of education: 12th   Occupational History  .  Other    [employer]   Social History Main Topics  . Smoking status: Never Smoker  . Smokeless tobacco: Never Used  . Alcohol use No  . Drug use: No  . Sexual activity: Not on file   Other Topics Concern  . Not on file   Social History Narrative   Patient lives at home with family.   Caffeine Use: 1 cup daily    Family History  Problem Relation Age of Onset  . Diabetes Mother   . Hypertension Mother   . Thyroid disease Sister    Review of Systems  Patient Active Problem List   Diagnosis Date Noted  . Gastroesophageal reflux disease 06/27/2016  . Disturbance of skin sensation 04/27/2014  . Dizziness and giddiness 03/14/2014  . Headache, migraine 03/14/2014  . Myalgia and  myositis, unspecified 06/07/2013  . Nausea and vomiting in adult 08/22/2012  . Viral syndrome 08/22/2012  . Pelvic pain in female 12/18/2010  . PID (acute pelvic inflammatory disease) 12/18/2010  . ANEMIA-IRON DEFICIENCY 10/15/2006  . HERPES, GENITAL NOS 09/30/2006  . OBESITY NOS 09/30/2006  . DISORDER, NONORGANIC SLEEP NOS 09/30/2006  . CONSTIPATION NOS 09/30/2006  . NEURITIS, LUMBOSACRAL NOS 09/30/2006  . HX, PERSONAL, MENTAL DISORDER NOS 09/30/2006    No Known Allergies  Prior to Admission medications   Medication Sig Start Date End Date Taking? Authorizing Provider  acetaminophen (TYLENOL) 500 MG tablet Take 1 tablet (500 mg total) by mouth every 6 (six) hours as needed for mild pain (pain). 07/24/16  Yes Massie Maroon, FNP  albuterol (PROVENTIL HFA;VENTOLIN HFA) 108 (90 Base) MCG/ACT inhaler Inhale 1-2 puffs into the lungs every 6 (six) hours as needed for wheezing or shortness of breath (cough). 12/05/16  Yes Bing Neighbors, FNP  cetirizine (ZYRTEC) 10 MG tablet Take 1 tablet (10 mg total) by mouth daily. 07/24/16  Yes Massie Maroon, FNP  diclofenac (VOLTAREN) 50 MG EC tablet Take 1 tablet (50 mg total) by mouth 2 (two) times daily. 11/16/16  Yes Neese, Hope M, NP  docusate sodium (COLACE) 100 MG capsule Take 100 mg by mouth 2 (two) times daily as needed for moderate constipation.    Yes  [provider]  esomeprazole (NEXIUM) 40 MG capsule Take 1 capsule (40 mg total) by mouth every morning. 06/26/16  Yes Massie Maroon, FNP  famotidine (PEPCID) 20 MG tablet Take 1 tablet (20 mg total) by mouth 2 (two) times daily. 12/19/16  Yes Earley Favor, NP  fluticasone [facility]) 50 MCG/ACT nasal spray Place 2 sprays into both nostrils daily. Patient taking differently: Place 2 sprays into both nostrils 4 (four) times a week.  11/25/16  Yes Horton, Mayer Masker, MD  ibuprofen (ADVIL,MOTRIN) 600 MG tablet Take 1 tablet (600 mg total) by mouth every 6 (six) hours as needed. 11/09/16   Yes Fayrene Helper, PA-C  magic mouthwash w/lidocaine SOLN Take 5 mLs by mouth 3 (three) times daily as needed for mouth pain. 12/13/16  Yes Dorena Bodo, NP  methocarbamol (ROBAXIN) 500 MG tablet Take 1 tablet (500 mg total) by mouth 2 (two) times daily. 11/16/16  Yes Neese, Hope M, NP  ondansetron (ZOFRAN) 4 MG tablet Take 1 tablet (4 mg total) by mouth every 8 (eight) hours as needed for nausea or vomiting. 06/26/16  Yes Massie Maroon, FNP  phenazopyridine (PYRIDIUM) 200 MG tablet Take 1 tablet (200 mg total) by mouth 3 (three) times daily. Patient taking differently: Take 200 mg by mouth 3 (three) times daily as needed for pain.  11/16/16  Yes Neese, Hope M, NP  predniSONE (DELTASONE) 10 MG tablet Take 2 tablets (20 mg total) by mouth daily. 12/19/16  Yes Earley Favor, NP  promethazine (PHENERGAN) 25 MG tablet Take 1 tablet (25 mg total) by mouth every 6 (six) hours as needed for nausea or vomiting. 03/18/16  Yes Renne Crigler, PA-C  sodium chloride (OCEAN) 0.65 % SOLN nasal spray Place 1 spray into both nostrils as needed for congestion. 11/25/16  Yes Horton, Mayer Masker, MD  zolpidem (AMBIEN) 5 MG tablet Take 1 tablet (5 mg total) by mouth at bedtime as needed for sleep. 12/05/16  Yes Bing Neighbors, FNP    Past Medical, Surgical Family and Social History reviewed and updated.    Objective:   Today's Vitals   12/30/16 1512  BP: 134/77  Pulse: 90  Resp: 16  Temp: 97.8 F (36.6 C)  TempSrc: Oral  SpO2: 100%  Weight: 212 lb (96.2 kg)  Height: 5\' 4"  (1.626 m)    Wt Readings from Last 3 Encounters:  12/30/16 212 lb (96.2 kg)  12/23/16 209 lb (94.8 kg)  12/19/16 210 lb (95.3 kg)    Physical Exam  Constitutional: She is oriented to person, place, and time. She appears well-developed and well-nourished.  HENT:  Head: Normocephalic and atraumatic.  Right Ear: External ear normal.  Left Ear: External ear normal.  Nose: Nose normal.  Mouth/Throat: Oropharynx is clear and  moist.  Eyes: Pupils are equal, round, and reactive to light. Conjunctivae and EOM are normal.  Neck: Normal range of motion. Neck supple.  Cardiovascular: Normal rate, regular rhythm, normal heart sounds and intact distal pulses.   Pulmonary/Chest: Effort normal and breath sounds normal.  Abdominal: Soft. Bowel sounds are normal.  Musculoskeletal: Normal range of motion.  Neurological: She is alert and oriented to person, place, and time.  Skin: Skin is warm and dry. Rash noted.  Nonvesicular papule-like lesions on the bilateral jawline which she complains of pruritic  Psychiatric: She has a normal mood and affect. Her behavior is normal. Judgment and thought content normal.   Assessment & Plan:  1. Generalized pain -Continue alternating naproxen and acetaminophen  as needed for generalized pain.  2. ANA positive -Place and a referral [facility] rheumatology as patient is uninsured.  3. Fatigue, unspecified type -Patient is being referred to rheumatology for additional follow-up. -Keep upcoming appointment with cardiology.  4. Rash, most likely dermatitis type rash -We will trial with triamcinolone cream 2 times a day as needed   Patient received medical clearance letter to return to work.  RTC: 3 months for routine follow-up.  Godfrey Pick. Tiburcio Pea, MSN, FNP-C [facility]  [address] Sherian Maroon [place], [place] [address] [phone]

## 2016-12-30 NOTE — Addendum Note (Signed)
Addended by: Bing Neighbors on: 12/30/2016 09:39 PM   Modules accepted: Orders

## 2017-01-01 ENCOUNTER — Ambulatory Visit: Payer: Self-pay | Attending: Internal Medicine

## 2017-01-01 MED FILL — ESOMEPRAZOLE MAG DR 40 MG C: 40 | 30 days supply | Qty: 30 | Fill #5

## 2017-01-02 ENCOUNTER — Telehealth: Payer: Self-pay | Admitting: Family Medicine

## 2017-01-02 NOTE — Telephone Encounter (Signed)
Chart completed from most recent office visit , [facility] for rheumatology follow-up.

## 2017-01-03 NOTE — Telephone Encounter (Signed)
This has been faxed to [facility]. Thanks!

## 2017-01-04 ENCOUNTER — Emergency Department (HOSPITAL_COMMUNITY)
Admission: EM | Admit: 2017-01-04 | Discharge: 2017-01-04 | Disposition: A | Discharge status: Home / Self Care | Payer: Self-pay | Attending: Emergency Medicine | Admitting: Emergency Medicine

## 2017-01-04 ENCOUNTER — Encounter (HOSPITAL_COMMUNITY): Payer: Self-pay | Admitting: *Deleted

## 2017-01-04 DIAGNOSIS — M545 Low back pain, unspecified: Secondary | ICD-10-CM

## 2017-01-04 DIAGNOSIS — Z79899 Other long term (current) drug therapy: Secondary | ICD-10-CM | POA: Insufficient documentation

## 2017-01-04 DIAGNOSIS — D649 Anemia, unspecified: Secondary | ICD-10-CM | POA: Insufficient documentation

## 2017-01-04 DIAGNOSIS — Z7983 Long term (current) use of bisphosphonates: Secondary | ICD-10-CM | POA: Insufficient documentation

## 2017-01-04 LAB — URINALYSIS, ROUTINE W REFLEX MICROSCOPIC
Bilirubin Urine: NEGATIVE
Glucose, UA: NEGATIVE mg/dL
Hgb urine dipstick: NEGATIVE
Ketones, ur: NEGATIVE mg/dL
Leukocytes, UA: NEGATIVE
Nitrite: NEGATIVE
Protein, ur: NEGATIVE mg/dL
Specific Gravity, Urine: 1.005 (ref 1.005–1.030)
pH: 6 (ref 5.0–8.0)

## 2017-01-04 NOTE — Discharge Instructions (Signed)
Please read attached information. If you experience any new or worsening signs or symptoms please return to the emergency room for evaluation. Please follow-up with your primary care provider or specialist as discussed.  °

## 2017-01-04 NOTE — ED Provider Notes (Signed)
[facility]-EMERGENCY DEPT Provider Note   CSN: [id number] Arrival date & time: 01/04/17  1812     History   Chief Complaint Chief Complaint  Patient presents with  . Back Pain  . Leg Pain    HPI [name] is a 41 y.o. female.  HPI   41 year old female presents today with complaints of back pain.  Patient notes that she has been out of work recently due to various complaints.  She went back to work 5 days ago and started developing right lower back pain.  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  She denies any known injuries, reports pain to the right lower lumbar musculature with no radiation of symptoms.  Patient also notes that over the last several months she has had very vague complaints of body aches, fatigue, she is currently being followed by her primary care for this and is referred to rheumatology.  Patient notes that she has had intermittent swelling of her bilateral lower extremities, notes that elevation makes this better, denies any chest pain or shortness of breath, history of heart failure.  She also notes urinary frequency, no dysuria.    Past Medical History:  Diagnosis Date  . Anemia   . Constipation   . GERD (gastroesophageal reflux disease)   . Hx of endometriosis   . Macromastia 07/2011  . Migraine     Patient Active Problem List   Diagnosis Date Noted  . ANA positive 12/30/2016  . Gastroesophageal reflux disease 06/27/2016  . Disturbance of skin sensation 04/27/2014  . Dizziness and giddiness 03/14/2014  . Headache, migraine 03/14/2014  . Myalgia and myositis, unspecified 06/07/2013  . Nausea and vomiting in adult 08/22/2012  . Viral syndrome 08/22/2012  . Pelvic pain in female 12/18/2010  . PID (acute pelvic inflammatory disease) 12/18/2010  . ANEMIA-IRON DEFICIENCY 10/15/2006  . HERPES, GENITAL NOS 09/30/2006  . OBESITY NOS 09/30/2006  . DISORDER, NONORGANIC SLEEP NOS 09/30/2006  . CONSTIPATION NOS 09/30/2006  . NEURITIS, LUMBOSACRAL NOS 09/30/2006  .  HX, PERSONAL, MENTAL DISORDER NOS 09/30/2006    Past Surgical History:  Procedure Laterality Date  . ABDOMINAL HYSTERECTOMY  12/12/2003  . ADENOIDECTOMY  05/2011  . ANKLE ARTHROSCOPY  12/29/2007   right; with extensive debridement  . BLADDER NECK RECONSTRUCTION  01/14/2011   Procedure: BLADDER NECK REPAIR;  Surgeon: Reva Bores, MD;  Location: [facility];  Service: Gynecology;  Laterality: N/A;  Laparoscopic Repair of Incidental Cystotomy  . BREAST REDUCTION SURGERY  08/05/2011   Procedure: MAMMARY REDUCTION  (BREAST);  Surgeon: Louisa Second, MD;  Location: [facility];  Service: Plastics;  Laterality: Bilateral;  bilateral  . CESAREAN SECTION  12/29/2000; 1994  . DILATION AND CURETTAGE OF UTERUS  07/08/2003   open laparoscopy  . LAPAROSCOPIC SALPINGOOPHERECTOMY  01/14/2011   left  . REPAIR PERONEAL TENDONS ANKLE  01/03/2010   repair right subluxing peroneal tendons  . RIGHT OOPHORECTOMY     with lysis of adhesions  . SALPINGECTOMY  12/12/2003   right; with TAH  . TONSILLECTOMY  as a child  . TUBAL LIGATION  12/29/2000    OB History    Gravida Para Term Preterm AB Living   2 2 2     2    SAB TAB Ectopic Multiple Live Births                   Home Medications    Prior to Admission medications   Medication Sig Start Date End Date  Taking? Authorizing Provider  acetaminophen (TYLENOL) 500 MG tablet Take 1 tablet (500 mg total) by mouth every 6 (six) hours as needed for mild pain (pain). 07/24/16   Massie MaroonHollis, Lachina M, FNP  albuterol (PROVENTIL HFA;VENTOLIN HFA) 108 (90 Base) MCG/ACT inhaler Inhale 1-2 puffs into the lungs every 6 (six) hours as needed for wheezing or shortness of breath (cough). 12/05/16   Bing NeighborsHarris, Kimberly S, FNP  cetirizine (ZYRTEC) 10 MG tablet Take 1 tablet (10 mg total) by mouth daily. 07/24/16   Massie MaroonHollis, Lachina M, FNP  diclofenac (VOLTAREN) 50 MG EC tablet Take 1 tablet (50 mg total) by mouth 2 (two) times daily. 11/16/16   Janne NapoleonNeese, Hope M, NP  docusate sodium  (COLACE) 100 MG capsule Take 100 mg by mouth 2 (two) times daily as needed for moderate constipation.     [provider]  esomeprazole (NEXIUM) 40 MG capsule Take 1 capsule (40 mg total) by mouth every morning. 06/26/16   Massie MaroonHollis, Lachina M, FNP  famotidine (PEPCID) 20 MG tablet Take 1 tablet (20 mg total) by mouth 2 (two) times daily. 12/19/16   Earley FavorSchulz, Gail, NP  fluticasone Aleda Grana(FLONASE) 50 MCG/ACT nasal spray Place 2 sprays into both nostrils daily. Patient taking differently: Place 2 sprays into both nostrils 4 (four) times a week.  11/25/16   Horton, Mayer Maskerourtney F, MD  ibuprofen (ADVIL,MOTRIN) 600 MG tablet Take 1 tablet (600 mg total) by mouth every 6 (six) hours as needed. 11/09/16   Fayrene Helperran, Bowie, PA-C  magic mouthwash w/lidocaine SOLN Take 5 mLs by mouth 3 (three) times daily as needed for mouth pain. 12/13/16   Dorena BodoKennard, Lawrence, NP  methocarbamol (ROBAXIN) 500 MG tablet Take 1 tablet (500 mg total) by mouth 2 (two) times daily. 11/16/16   Janne NapoleonNeese, Hope M, NP  ondansetron (ZOFRAN) 4 MG tablet Take 1 tablet (4 mg total) by mouth every 8 (eight) hours as needed for nausea or vomiting. 06/26/16   Massie MaroonHollis, Lachina M, FNP  phenazopyridine (PYRIDIUM) 200 MG tablet Take 1 tablet (200 mg total) by mouth 3 (three) times daily. Patient taking differently: Take 200 mg by mouth 3 (three) times daily as needed for pain.  11/16/16   Janne NapoleonNeese, Hope M, NP  predniSONE (DELTASONE) 10 MG tablet Take 2 tablets (20 mg total) by mouth daily. 12/19/16   Earley FavorSchulz, Gail, NP  promethazine (PHENERGAN) 25 MG tablet Take 1 tablet (25 mg total) by mouth every 6 (six) hours as needed for nausea or vomiting. 03/18/16   Renne CriglerGeiple, Joshua, PA-C  sodium chloride (OCEAN) 0.65 % SOLN nasal spray Place 1 spray into both nostrils as needed for congestion. 11/25/16   Horton, Mayer Maskerourtney F, MD  triamcinolone cream (KENALOG) 0.1 % Apply 1 application topically 2 (two) times daily. 12/30/16   Bing NeighborsHarris, Kimberly S, FNP  zolpidem (AMBIEN) 5 MG tablet Take 1  tablet (5 mg total) by mouth at bedtime as needed for sleep. 12/05/16   Bing NeighborsHarris, Kimberly S, FNP    Family History Family History  Problem Relation Age of Onset  . Diabetes Mother   . Hypertension Mother   . Thyroid disease Sister     Social History Social History  Substance Use Topics  . Smoking status: Never Smoker  . Smokeless tobacco: Never Used  . Alcohol use No     Allergies   Patient has no known allergies.   Review of Systems Review of Systems  All other systems reviewed and are negative.    Physical Exam Updated Vital Signs BP 122/86 (  BP Location: Left Arm)   Pulse 70   Temp 98.3 F (36.8 C) (Oral)   Resp 16   LMP 12/27/2003   SpO2 98%   Physical Exam  Constitutional: She is oriented to person, place, and time. She appears well-developed and well-nourished.  HENT:  Head: Normocephalic and atraumatic.  Eyes: Pupils are equal, round, and reactive to light. Conjunctivae are normal. Right eye exhibits no discharge. Left eye exhibits no discharge. No scleral icterus.  Neck: Normal range of motion. No JVD present. No tracheal deviation present.  Pulmonary/Chest: Effort normal. No stridor.  Musculoskeletal:  No CT or L-spine tenderness palpation.  Tenderness palpation of bilateral lumbar soft tissue, distal sensation strength and motor function intact.  No significant swelling or edema to the lower extremities  Neurological: She is alert and oriented to person, place, and time. Coordination normal.  Psychiatric: She has a normal mood and affect. Her behavior is normal. Judgment and thought content normal.  Nursing note and vitals reviewed.    ED Treatments / Results  Labs (all labs ordered are listed, but only abnormal results are displayed) Labs Reviewed  URINALYSIS, ROUTINE W REFLEX MICROSCOPIC - Abnormal; Notable for the following:       Result Value   Color, Urine STRAW (*)    All other components within normal limits    EKG  EKG  Interpretation None       Radiology No results found.  Procedures Procedures (including critical care time)  Medications Ordered in ED Medications - No data to display   Initial Impression / Assessment and Plan / ED Course  I have reviewed the triage vital signs and the nursing notes.  Pertinent labs & imaging results that were available during my care of the patient were reviewed by me and considered in my medical decision making (see chart for details).       Assessment/Plan: 41 year old female presents today with back pain.  This is likely muscular in nature, no significant red flags here today.  Patient also reports intermittent lower extremity edema, low suspicion for cardiac cause, DVT, or any other acute life-threatening etiology.  Patient is followed by her primary care for her ongoing chronic issues.  She is given strict return precautions, she verbalized understanding and agreement to today's plan had no further questions or concerns   Final Clinical Impressions(s) / ED Diagnoses   Final diagnoses:  Acute right-sided low back pain without sciatica    New Prescriptions Discharge Medication List as of 01/04/2017  8:46 PM       Eyvonne Mechanic, PA-C 01/04/17 2052    Melene Plan, DO 01/04/17 2307

## 2017-01-04 NOTE — ED Triage Notes (Signed)
Pt reports right side lower back pain for several days, having swelling to bilateral legs and pain. Ambulatory at triage. Reports fever yesterday. Recently referred to rheumatologist for possible autoimmune disorder.

## 2017-01-07 ENCOUNTER — Encounter: Payer: Self-pay | Admitting: Family Medicine

## 2017-01-07 ENCOUNTER — Ambulatory Visit: Payer: No Typology Code available for payment source | Admitting: Family Medicine

## 2017-01-07 ENCOUNTER — Other Ambulatory Visit: Payer: Self-pay | Admitting: Family Medicine

## 2017-01-08 MED ORDER — ZOLPIDEM TARTRATE 5 MG PO TABS: 5.0000 mg | ORAL_TABLET | Freq: Every evening | ORAL | 0 refills | Status: DC | PRN

## 2017-01-08 MED FILL — ?CETIRIZINE HCL 10 MG TABLE: 10 | 30 days supply | Qty: 30 | Fill #3

## 2017-01-10 ENCOUNTER — Encounter: Payer: Self-pay | Admitting: Family Medicine

## 2017-01-13 ENCOUNTER — Ambulatory Visit (INDEPENDENT_AMBULATORY_CARE_PROVIDER_SITE_OTHER): Payer: No Typology Code available for payment source | Admitting: Family Medicine

## 2017-01-13 ENCOUNTER — Encounter: Payer: Self-pay | Admitting: Family Medicine

## 2017-01-13 VITALS — BP 130/76 | HR 72 | Temp 98.0°F | Resp 14 | Ht 64.0 in | Wt 211.0 lb

## 2017-01-13 DIAGNOSIS — M797 Fibromyalgia: Secondary | ICD-10-CM

## 2017-01-13 DIAGNOSIS — F450 Somatization disorder: Secondary | ICD-10-CM

## 2017-01-13 DIAGNOSIS — G894 Chronic pain syndrome: Secondary | ICD-10-CM

## 2017-01-13 NOTE — Progress Notes (Signed)
Patient ID: [name], female    DOB: 08-Oct-1975, [age] y.o.   MRN: [id number]  PCP: Bing NeighborsHarris, Octavious Zidek S, FNP  Chief Complaint  Patient presents with  . Leg Pain    Right    Subjective:  HPI [name] is a 41 y.o. female presents for evaluation of leg pain, bruising of arms, and  feet swelling. [name] over the course of the last 2 months has had multiple visits at various emergency departments for aforementioned complaints in addition to shortness of breath and chest pain. During a recent visit in office she was found to have a positive ANA, [facility] and was seen by Dr. Maurine Ministerennis Ang, MD on 01/10/2017. According to his recent encounter notes, patient unlikely has lupus and symptoms are more consistent with fibromyalgia and possible conversion disorder. [name] reports that she became very upset with the Rheumatologist as she disagrees with his diagnosis. Today she complains of right leg swelling and foot swelling which has resolved since presenting to the clinic. Reports her leg and foots swells up every evening. She complains of two bruises located on her right arm. Denies injury to right arm. [name] has upcoming appointments with cardiology and gastroenterology for evaluation of chest pain and globus, both symptoms she reports as continuing to be on-going.  Social History   Social History  . Marital status: Divorced    Spouse name: N/A  . Number of children: 2  . Years of education: 12th   Occupational History  .  Other    [employer]   Social History Main Topics  . Smoking status: Never Smoker  . Smokeless tobacco: Never Used  . Alcohol use No  . Drug use: No  . Sexual activity: Not on file   Other Topics Concern  . Not on file   Social History Narrative   Patient lives at home with family.   Caffeine Use: 1 cup daily    Family History  Problem Relation Age of Onset  . Diabetes Mother   . Hypertension Mother   . Thyroid disease  Sister    Review of Systems See HPI  Patient Active Problem List   Diagnosis Date Noted  . ANA positive 12/30/2016  . Gastroesophageal reflux disease 06/27/2016  . Disturbance of skin sensation 04/27/2014  . Dizziness and giddiness 03/14/2014  . Headache, migraine 03/14/2014  . Myalgia and myositis, unspecified 06/07/2013  . Nausea and vomiting in adult 08/22/2012  . Viral syndrome 08/22/2012  . Pelvic pain in female 12/18/2010  . PID (acute pelvic inflammatory disease) 12/18/2010  . ANEMIA-IRON DEFICIENCY 10/15/2006  . HERPES, GENITAL NOS 09/30/2006  . OBESITY NOS 09/30/2006  . DISORDER, NONORGANIC SLEEP NOS 09/30/2006  . CONSTIPATION NOS 09/30/2006  . NEURITIS, LUMBOSACRAL NOS 09/30/2006  . HX, PERSONAL, MENTAL DISORDER NOS 09/30/2006    No Known Allergies  Prior to Admission medications   Medication Sig Start Date End Date Taking? Authorizing Provider  acetaminophen (TYLENOL) 500 MG tablet Take 1 tablet (500 mg total) by mouth every 6 (six) hours as needed for mild pain (pain). 07/24/16  Yes Massie MaroonHollis, Lachina M, FNP  albuterol (PROVENTIL HFA;VENTOLIN HFA) 108 (90 Base) MCG/ACT inhaler Inhale 1-2 puffs into the lungs every 6 (six) hours as needed for wheezing or shortness of breath (cough). 12/05/16  Yes Bing NeighborsHarris, Kyri Dai S, FNP  cetirizine (ZYRTEC) 10 MG tablet Take 1 tablet (10 mg total) by mouth daily. 07/24/16  Yes Massie MaroonHollis, Lachina M, FNP  diclofenac (VOLTAREN) 50 MG EC tablet Take 1 tablet (50 mg total) by mouth 2 (two) times daily. 11/16/16  Yes Neese, Hope M, NP  docusate sodium (COLACE) 100 MG capsule Take 100 mg by mouth 2 (two) times daily as needed for moderate constipation.    Yes [provider]  esomeprazole (NEXIUM) 40 MG capsule Take 1 capsule (40 mg total) by mouth every morning. 06/26/16  Yes Massie Maroon, FNP  fluticasone (FLONASE) 50 MCG/ACT nasal spray Place 2 sprays into both nostrils daily. Patient taking differently: Place 2 sprays into both nostrils  4 (four) times a week.  11/25/16  Yes Horton, Mayer Masker, MD  ibuprofen (ADVIL,MOTRIN) 600 MG tablet Take 1 tablet (600 mg total) by mouth every 6 (six) hours as needed. 11/09/16  Yes Fayrene Helper, PA-C  magic mouthwash w/lidocaine SOLN Take 5 mLs by mouth 3 (three) times daily as needed for mouth pain. 12/13/16  Yes Dorena Bodo, NP  methocarbamol (ROBAXIN) 500 MG tablet Take 1 tablet (500 mg total) by mouth 2 (two) times daily. 11/16/16  Yes Neese, Hope M, NP  ondansetron (ZOFRAN) 4 MG tablet Take 1 tablet (4 mg total) by mouth every 8 (eight) hours as needed for nausea or vomiting. 06/26/16  Yes Massie Maroon, FNP  phenazopyridine (PYRIDIUM) 200 MG tablet Take 1 tablet (200 mg total) by mouth 3 (three) times daily. Patient taking differently: Take 200 mg by mouth 3 (three) times daily as needed for pain.  11/16/16  Yes Neese, Hope M, NP  promethazine (PHENERGAN) 25 MG tablet Take 1 tablet (25 mg total) by mouth every 6 (six) hours as needed for nausea or vomiting. 03/18/16  Yes Renne Crigler, PA-C  sodium chloride (OCEAN) 0.65 % SOLN nasal spray Place 1 spray into both nostrils as needed for congestion. 11/25/16  Yes Horton, Mayer Masker, MD  triamcinolone cream (KENALOG) 0.1 % Apply 1 application topically 2 (two) times daily. 12/30/16  Yes Bing Neighbors, FNP  zolpidem (AMBIEN) 5 MG tablet Take 1 tablet (5 mg total) by mouth at bedtime as needed for sleep. 01/08/17  Yes Bing Neighbors, FNP  famotidine (PEPCID) 20 MG tablet Take 1 tablet (20 mg total) by mouth 2 (two) times daily. Patient not taking: Reported on 01/13/2017 12/19/16   Earley Favor, NP    Past Medical, Surgical Family and Social History reviewed and updated.    Objective:   Today's Vitals   01/13/17 1413  BP: 130/76  Pulse: 72  Resp: 14  Temp: 98 F (36.7 C)  TempSrc: Oral  SpO2: 100%  Weight: 211 lb (95.7 kg)  Height:  (1.626 m)    Wt Readings from Last 3 Encounters:  01/13/17 211 lb (95.7 kg)  12/30/16  212 lb (96.2 kg)  12/23/16 209 lb (94.8 kg)   Physical Exam  Constitutional: She is oriented to person, place, and time. She appears well-developed and well-nourished.  HENT:  Head: Normocephalic and atraumatic.  Eyes: Pupils are equal, round, and reactive to light. Conjunctivae and EOM are normal.  Neck: Normal range of motion. Neck supple. No thyromegaly present.  Cardiovascular: Normal rate, regular rhythm, normal heart sounds and intact distal pulses.   Pulmonary/Chest: Effort normal and breath sounds normal.  Musculoskeletal: Normal range of motion.  No edema present.  Lymphadenopathy:    She has no cervical adenopathy.  Neurological: She is alert and oriented to person, place, and time.  Skin: Skin is warm and dry.  Two small, blanching bruising  present on right arm.   Psychiatric: Her speech is normal. Her affect is angry and inappropriate. Thought content is paranoid. Cognition and memory are normal. She expresses inappropriate judgment.  Ronalda is verbally agitated, very tearful,  and frustrated when told recent test, physical exam findings are benign   Assessment & Plan:  1. Chronic pain disorder, symptoms likely secondary to fibromyalgia - Ambulatory referral to Pain Clinic 2. Fibromyalgia, secondary to chronic generalized pain 3. Somatization disorder, psychiatric evaluation warranted as patient is focused on illness in spite of negative diagnostic testing. Kassidi becomes very upset when she told that labs are negative. Her affect is inappropriate and she is convinced that her care is being managed inappropriately. She has been to ED's in different counties and cities within less than 24 hours with the same symptoms and benign work-ups. She did finally agree today, she at least be evaluated by [facility] in spite of feeling that her psychiatric status is not the underlying issue.  -Continue ibuprofen, acetaminophen for pain. Continue Ambien for insomnia symptoms.   RTC: 6 months for  wellness visit   Godfrey Pick. Tiburcio Pea, MSN, FNP-C [facility]  [address] [place] Maroon [place], [place] [address] [phone]

## 2017-01-13 NOTE — Patient Instructions (Signed)
Somatic Symptom Disorder Somatic symptom disorder is a mental disorder. People with this disorder have physical (somatic) symptoms that cause distress or affect daily function. However, no other medical condition can be found to explain these symptoms. In addition, people with this disorder react to the somatic symptoms in a way that is out of proportion to the symptoms. The reaction may include:  Thinking all the time about the severity of the symptoms.  Feeling very anxious all the time about the symptoms or general health.  Spending a lot of time and energy dealing with the symptoms or health concerns.  Somatic symptom disorder may interfere with relationships, work, school, or other daily activities. It may lead to frequent medical visits and many medical tests to determine the cause of symptoms. It may also lead to surgical procedures that do not help and can cause serious problems. People who have pain as the main or only symptom may become addicted to pain medicines. People with somatic symptom disorder are also at risk for alcohol or drug addiction, suicide attempts, and divorce. Somatic symptom disorder may start in childhood but is most common in young adults. The disorder may be triggered by stressful life events. It may last for several years or may come and go throughout life. What increases the risk? Risk factors include:  Being female. The disorder is more common in females than males.  Having a history of childhood abuse.  Having family members with the disorder.  What are the signs or symptoms? Signs and symptoms of somatic symptom disorder may include:  Pain. Pain may involve any body part or organ. It may be the only somatic symptom present.  Stomach or intestinal symptoms. Examples include nausea, vomiting, bloating, diarrhea, or food intolerance.  Problems with sexual or reproductive function. This may include irregular or heavy periods in women and erectile  problems in men.  A person with this disorder may also have symptoms that suggest disorders of the brain or nervous system. Examples include:  Loss of balance.  Muscle weakness.  Difficulty urinating.  Difficulty swallowing or the feeling of having a lump in the throat.  Difficulty speaking.  Loss of the sensation of touch or pain.  Blindness or double vision.  Deafness.  Seizures.  How is this diagnosed? Somatic symptom disorder is diagnosed through an evaluation by your health care provider. Exams and tests will be done to rule out serious physical health problems. The evaluation will vary depending on your specific symptoms. It may include:  A physical exam.  Lab tests on blood or urine samples.  X-rays or other imaging studies.  Other procedures.  Your health care provider may refer you to a mental health specialist for psychological evaluation. Somatic symptoms can be related to a number of mental disorders. How is this treated? The most effective treatment for somatic symptom disorder is a combination of the following options:  Regular follow-up visits with your health care provider for evaluation and reassurance.  Counseling or talk therapy.Talk therapy is provided bymental health specialists. The goals are to help you understand what triggers your symptoms and to help you learn coping skills.  Medicine. Certain medicines can help with severe anxiety or depression related to somatic symptom disorder.  Healthy lifestyle.Balanced diet and regular exercise can reduce stress and somatic symptoms.  Follow these instructions at home:  Take medicines only as directed by your health care provider.  Get regular exercise. Check with your health care provider before starting  an exercise program.  Keep all follow-up visits as directed by your health care provider. This is important. Contact a health care provider if:  Your pain or symptoms do not go away or they  become severe.  You develop new symptoms. Get help right away if: You have serious thoughts about hurting yourself or someone else. This information is not intended to replace advice given to you by your health care provider. Make sure you discuss any questions you have with your health care provider. Document Released: 05/25/2010 Document Revised: 09/28/2015 Document Reviewed: 09/02/2013 Elsevier Interactive Patient Education  2018 [employer].  Chronic Pain, Adult Chronic pain is a type of pain that lasts or keeps coming back (recurs) for at least six months. You may have chronic headaches, abdominal pain, or body pain. Chronic pain may be related to an illness, such as fibromyalgia or complex regional pain syndrome. Sometimes the cause of chronic pain is not known. Chronic pain can make it hard for you to do daily activities. If not treated, chronic pain can lead to other health problems, including anxiety and depression. Treatment depends on the cause and severity of your pain. You may need to work with a pain specialist to come up with a treatment plan. The plan may include medicine, counseling, and physical therapy. Many people benefit from a combination of two or more types of treatment to control their pain. Follow these instructions at home: Lifestyle  Consider keeping a pain diary to share with your health care providers.  Consider talking with a mental health care provider (psychologist) about how to cope with chronic pain.  Consider joining a chronic pain support group.  Try to control or lower your stress levels. Talk to your health care provider about strategies to do this. General instructions   Take over-the-counter and prescription medicines only as told by your health care provider.  Follow your treatment plan as told by your health care provider. This may include: ? Gentle, regular exercise. ? Eating a healthy diet that includes foods such as vegetables, fruits,  fish, and lean meats. ? Cognitive or behavioral therapy. ? Working with a Adult nurse. ? Meditation or yoga. ? Acupuncture or massage therapy. ? Aroma, color, light, or sound therapy. ? Local electrical stimulation. ? Shots (injections) of numbing or pain-relieving medicines into the spine or the area of pain.  Check your pain level as told by your health care provider. Ask your health care provider if you should use a pain scale.  Learn as much as you can about how to manage your chronic pain. Ask your health care provider if an intensive pain rehabilitation program or a chronic pain specialist would be helpful.  Keep all follow-up visits as told by your health care provider. This is important. Contact a health care provider if:  Your pain gets worse.  You have new pain.  You have trouble sleeping.  You have trouble doing your normal activities.  Your pain is not controlled with treatment.  Your have side effects from pain medicine.  You feel weak. Get help right away if:  You lose feeling or have numbness in your body.  You lose control of bowel or bladder function.  Your pain suddenly gets much worse.  You develop shaking or chills.  You develop confusion.  You develop chest pain.  You have trouble breathing or shortness of breath.  You pass out.  You have thoughts about hurting yourself or others. This information is not intended to  replace advice given to you by your health care provider. Make sure you discuss any questions you have with your health care provider. Document Released: 01/12/2002 Document Revised: 12/21/2015 Document Reviewed: 10/10/2015 Elsevier Interactive Patient Education  2017 [employer] Inc.  Myofascial Pain Syndrome and Fibromyalgia Myofascial pain syndrome and fibromyalgia are both pain disorders. This pain may be felt mainly in your muscles.  Myofascial pain syndrome: ? Always has trigger points or tender points in the muscle  that will cause pain when pressed. The pain may come and go. ? Usually affects your neck, upper back, and shoulder areas. The pain often radiates into your arms and hands.  Fibromyalgia: ? Has muscle pains and tenderness that come and go. ? Is often associated with fatigue and sleep disturbances. ? Has trigger points. ? Tends to be long-lasting (chronic), but is not life-threatening.  Fibromyalgia and myofascial pain are not the same. However, they often occur together. If you have both conditions, each can make the other worse. Both are common and can cause enough pain and fatigue to make day-to-day activities difficult. What are the causes? The exact causes of fibromyalgia and myofascial pain are not known. People with certain gene types may be more likely to develop fibromyalgia. Some factors can be triggers for both conditions, such as:  Spine disorders.  Arthritis.  Severe injury (trauma) and other physical stressors.  Being under a lot of stress.  A medical illness.  What are the signs or symptoms? Fibromyalgia The main symptom of fibromyalgia is widespread pain and tenderness in your muscles. This can vary over time. Pain is sometimes described as stabbing, shooting, or burning. You may have tingling or numbness, too. You may also have sleep problems and fatigue. You may wake up feeling tired and groggy (fibro fog). Other symptoms may include:  Bowel and bladder problems.  Headaches.  Visual problems.  Problems with odors and noises.  Depression or mood changes.  Painful menstrual periods (dysmenorrhea).  Dry skin or eyes.  Myofascial pain syndrome Symptoms of myofascial pain syndrome include:  Tight, ropy bands of muscle.  Uncomfortable sensations in muscular areas, such as: ? Aching. ? Cramping. ? Burning. ? Numbness. ? Tingling. ? Muscle weakness.  Trouble moving certain muscles freely (range of motion).  How is this diagnosed? There are no specific  tests to diagnose fibromyalgia or myofascial pain syndrome. Both can be hard to diagnose because their symptoms are common in many other conditions. Your health care provider may suspect one or both of these conditions based on your symptoms and medical history. Your health care provider will also do a physical exam. The key to diagnosing fibromyalgia is having pain, fatigue, and other symptoms for more than three months that cannot be explained by another condition. The key to diagnosing myofascial pain syndrome is finding trigger points in muscles that are tender and cause pain elsewhere in your body (referred pain). How is this treated? Treating fibromyalgia and myofascial pain often requires a team of health care providers. This usually starts with your primary provider and a physical therapist. You may also find it helpful to work with alternative health care providers, such as massage therapists or acupuncturists. Treatment for fibromyalgia may include medicines. This may include nonsteroidal anti-inflammatory drugs (NSAIDs), along with other medicines. Treatment for myofascial pain may also include:  NSAIDs.  Cooling and stretching of muscles.  Trigger point injections.  Sound wave (ultrasound) treatments to stimulate muscles.  Follow these instructions at home:  Take medicines only  as directed by your health care provider.  Exercise as directed by your health care provider or physical therapist.  Try to avoid stressful situations.  Practice relaxation techniques to control your stress. You may want to try: ? Biofeedback. ? Visual imagery. ? Hypnosis. ? Muscle relaxation. ? Yoga. ? Meditation.  Talk to your health care provider about alternative treatments, such as acupuncture or massage treatment.  Maintain a healthy lifestyle. This includes eating a healthy diet and getting enough sleep.  Consider joining a support group.  Do not do activities that stress or strain your  muscles. That includes repetitive motions and heavy lifting. Where to find more information:  National Fibromyalgia Association: www.fmaware.org  Arthritis Foundation: www.arthritis.org  American Chronic Pain Association: GumSearch.nl Contact a health care provider if:  You have new symptoms.  Your symptoms get worse.  You have side effects from your medicines.  You have trouble sleeping.  Your condition is causing depression or anxiety. This information is not intended to replace advice given to you by your health care provider. Make sure you discuss any questions you have with your health care provider. Document Released: 04/22/2005 Document Revised: 09/28/2015 Document Reviewed: 01/26/2014 Elsevier Interactive Patient Education  [employer].

## 2017-01-14 ENCOUNTER — Encounter: Payer: Self-pay | Admitting: Cardiovascular Disease

## 2017-01-14 ENCOUNTER — Ambulatory Visit (INDEPENDENT_AMBULATORY_CARE_PROVIDER_SITE_OTHER): Payer: No Typology Code available for payment source | Admitting: Cardiovascular Disease

## 2017-01-14 VITALS — BP 118/80 | HR 78 | Ht 64.0 in | Wt 210.0 lb

## 2017-01-14 DIAGNOSIS — R079 Chest pain, unspecified: Secondary | ICD-10-CM

## 2017-01-14 DIAGNOSIS — R600 Localized edema: Secondary | ICD-10-CM

## 2017-01-14 NOTE — Progress Notes (Signed)
Cardiology Office Note   Date:  01/14/2017   ID:  [name], DOB 07-06-1975, MRN [id number]  PCP:  Bing NeighborsHarris, Kimberly S, FNP  Cardiologist:   Chilton Siiffany Palmetto, MD   Chief Complaint  [name] presents with  . New [name] (Initial Visit)    pt c/o chest pain, sob, swelling in legs and feet      History of Present Illness: [name] is a 41 y.o. female who is being seen today for Sheila evaluation of chest pain at Sheila request of Bing NeighborsHarris, Kimberly S, FNP.  [name] saw [name] on 12/2015 and reported chest pain.  She has been seen in Sheila emergency department several times with various complaints including chest pain, arm numbness, trouble swallowing, hives, hair loss, dysphagia, right hand and arm numbness and tingling.  Cardiac enzymes have been negative. EKGs have been negative for ischemia and showed sinus rhythm and tachycardia.  She reports chest pain and tightness that is 6 out of 10 in severity. This is been ongoing intermittently for Sheila last few months. It comes and goes approximately twice per day. It typically occurs when she is walking.  There is associated shortness of breath and occasionally nausea but no diaphoresis. Each episode lasts for approximately 10 minutes. She walks for exercise approximately twice per week.  She has had to limit her exercise due to this discomfort. He previously like going to Sheila gym but is no longer able to do so. For Sheila last 2 months she has also noted mild lower extremity edema that improves with elevation of her legs. She was prescribed an albuterol inhaler but this has not helped with her symptoms.  She denies orthopnea or PND.   Past Medical History:  Diagnosis Date  . Anemia   . Constipation   . GERD (gastroesophageal reflux disease)   . Hx of endometriosis   . Macromastia 07/2011  . Migraine     Past Surgical History:  Procedure Laterality Date  . ABDOMINAL HYSTERECTOMY  12/12/2003  . ADENOIDECTOMY  05/2011  . ANKLE  ARTHROSCOPY  12/29/2007   right; with extensive debridement  . BLADDER NECK RECONSTRUCTION  01/14/2011   Procedure: BLADDER NECK REPAIR;  Surgeon: Reva Boresanya S Pratt, MD;  Location: [facility];  Service: Gynecology;  Laterality: N/A;  Laparoscopic Repair of Incidental Cystotomy  . BREAST REDUCTION SURGERY  08/05/2011   Procedure: MAMMARY REDUCTION  (BREAST);  Surgeon: Louisa SecondGerald Truesdale, MD;  Location: [facility];  Service: Plastics;  Laterality: Bilateral;  bilateral  . CESAREAN SECTION  12/29/2000; 1994  . DILATION AND CURETTAGE OF UTERUS  07/08/2003   open laparoscopy  . LAPAROSCOPIC SALPINGOOPHERECTOMY  01/14/2011   left  . REPAIR PERONEAL TENDONS ANKLE  01/03/2010   repair right subluxing peroneal tendons  . RIGHT OOPHORECTOMY     with lysis of adhesions  . SALPINGECTOMY  12/12/2003   right; with TAH  . TONSILLECTOMY  as a child  . TUBAL LIGATION  12/29/2000     Current Outpatient Prescriptions  Medication Sig Dispense Refill  . docusate sodium (COLACE) 100 MG capsule Take 100 mg by mouth 2 (two) times daily as needed for moderate constipation.     [name]. esomeprazole (NEXIUM) 40 MG capsule Take 1 capsule (40 mg total) by mouth every morning. 90 capsule 1  . fluticasone (FLONASE) 50 MCG/ACT nasal spray Place 2 sprays into both nostrils daily. ([name] taking differently: Place 2 sprays into both nostrils 4 (four) times a week. )  16 g 0  . ibuprofen (ADVIL,MOTRIN) 600 MG tablet Take 1 tablet (600 mg total) by mouth every 6 (six) hours as needed. 30 tablet 0  . magic mouthwash w/lidocaine SOLN Take 5 mLs by mouth 3 (three) times daily as needed for mouth pain. 100 mL 0  . acetaminophen (TYLENOL) 500 MG tablet Take 1 tablet (500 mg total) by mouth every 6 (six) hours as needed for mild pain (pain). 30 tablet 0  . albuterol (PROVENTIL HFA;VENTOLIN HFA) 108 (90 Base) MCG/ACT inhaler Inhale 1-2 puffs into Sheila lungs every 6 (six) hours as needed for wheezing or shortness of breath (cough). 1 Inhaler 2   . cetirizine (ZYRTEC) 10 MG tablet Take 1 tablet (10 mg total) by mouth daily. 30 tablet 11  . triamcinolone cream (KENALOG) 0.1 % Apply 1 application topically 2 (two) times daily. 30 g 0  . zolpidem (AMBIEN) 5 MG tablet Take 1 tablet (5 mg total) by mouth at bedtime as needed for sleep. 30 tablet 0   No current facility-administered medications for this visit.     Allergies:   [name] has no known allergies.    Social History:  [name]  reports that she has never smoked. She has never used smokeless tobacco. She reports that she does not drink alcohol or use drugs.   Family History:  [name]'s family history includes Diabetes in her mother; Heart attack in her maternal grandmother; Hypertension in her mother; Stroke in her maternal grandfather; Thyroid disease in her sister.    ROS:  Please see Sheila history of present illness.   Otherwise, review of systems are positive for feet cramping.   All other systems are reviewed and negative.    PHYSICAL EXAM: VS:  BP 118/80 (BP Location: Left Arm, [name] Position: Sitting, Cuff Size: Large)   Pulse 78   Ht  (1.626 m)   Wt 95.3 kg (210 lb)   LMP 12/27/2003   BMI 36.05 kg/m  , BMI Body mass index is 36.05 kg/m. GENERAL:  Well appearing HEENT:  Pupils equal round and reactive, fundi not visualized, oral mucosa unremarkable NECK:  No jugular venous distention, waveform within normal limits, carotid upstroke brisk and symmetric, no bruits, no thyromegaly LUNGS:  Clear to auscultation bilaterally HEART:  RRR.  PMI not displaced or sustained,S1 and S2 within normal limits, no S3, no S4, no clicks, no rubs, no murmurs ABD:  Flat, positive bowel sounds normal in frequency in pitch, no bruits, no rebound, no guarding, no midline pulsatile mass, no hepatomegaly, no splenomegaly EXT:  2 plus pulses throughout, no edema, no cyanosis no clubbing SKIN:  No rashes no nodules NEURO:  Cranial nerves II through XII grossly intact, motor  grossly intact throughout PSYCH:  Cognitively intact, oriented to person place and time   EKG:  EKG is ordered today. Sheila ekg ordered today demonstrates sinus rhythm. Rate 70 bpm.   Recent Labs: 12/09/2016: Platelets 331; TSH 3.821 12/19/2016: Hemoglobin 11.6 12/23/2016: ALT 12; BUN 8; Creat 0.77; Potassium 4.9; Sodium 139    Lipid Panel    Component Value Date/Time   CHOL 177 12/06/2015 1153   TRIG 64 12/06/2015 1153   HDL 55 12/06/2015 1153   CHOLHDL 3.2 12/06/2015 1153   VLDL 13 12/06/2015 1153   LDLCALC 109 12/06/2015 1153      Wt Readings from Last 3 Encounters:  01/14/17 95.3 kg (210 lb)  01/13/17 95.7 kg (211 lb)  12/30/16 96.2 kg (212 lb)  ASSESSMENT AND PLAN:  # Chest pain:  [name]'s symptoms are concerning for ischemia.  We will get an ETT to assess for CAD.    # LE edema: Not present on exam today.  Recommended limiting salt intake, walking more at work and compression stockings.    Current medicines are reviewed at length with [name] today.  [name] does not have concerns regarding medicines.  Sheila following changes have been made:  no change  Labs/ tests ordered today include:   Orders Placed This Encounter  Procedures  . Exercise Tolerance Test  . EKG 12-Lead     Disposition:   FU with Dailyn Kempner C. Duke Salvia, MD, [facility] in 1 month.    This note was written with Sheila assistance of speech recognition software.  Please excuse any transcriptional errors.  Signed, Shelly Spenser C. Duke Salvia, MD, [facility]  01/14/2017 7:39 PM    [facility]

## 2017-01-14 NOTE — Patient Instructions (Signed)
Medication Instructions:  Your physician recommends that you continue on your current medications as directed. Please refer to the Current Medication list given to you today.  Labwork: NONE  Testing/Procedures: Your physician has requested that you have an exercise tolerance test. For further information please visit [email or web address]. Please also follow instruction sheet, as given.  Follow-Up: Your physician recommends that you schedule a follow-up appointment in: 4-6 WEEKS  Any Other Special Instructions Will Be Listed Below (If Applicable).   Exercise Stress Electrocardiogram An exercise stress electrocardiogram is a test to check how blood flows to your heart. It is done to find areas of poor blood flow. You will need to walk on a treadmill for this test. The electrocardiogram will record your heartbeat when you are at rest and when you are exercising. What happens before the procedure?  Do not have drinks with caffeine or foods with caffeine for 24 hours before the test, or as told by your doctor. This includes coffee, tea (even decaf tea), sodas, chocolate, and cocoa.  Follow your doctor's instructions about eating and drinking before the test.  Ask your doctor what medicines you should or should not take before the test. Take your medicines with water unless told by your doctor not to.  If you use an inhaler, bring it with you to the test.  Bring a snack to eat after the test.  Do not  smoke for 4 hours before the test.  Do not put lotions, powders, creams, or oils on your chest before the test.  Wear comfortable shoes and clothing. What happens during the procedure?  You will have patches put on your chest. Small areas of your chest may need to be shaved. Wires will be connected to the patches.  Your heart rate will be watched while you are resting and while you are exercising.  You will walk on the treadmill. The treadmill will slowly get faster to raise your heart  rate.  The test will take about 1-2 hours. What happens after the procedure?  Your heart rate and blood pressure will be watched after the test.  You may return to your normal diet, activities, and medicines or as told by your doctor. This information is not intended to replace advice given to you by your health care provider. Make sure you discuss any questions you have with your health care provider. Document Released: 10/09/2007 Document Revised: 12/20/2015 Document Reviewed: 12/28/2012 Elsevier Interactive Patient Education  [employer]2018 [employer]

## 2017-01-15 ENCOUNTER — Emergency Department (HOSPITAL_COMMUNITY)
Admission: EM | Admit: 2017-01-15 | Discharge: 2017-01-16 | Disposition: A | Discharge status: Home / Self Care | Payer: No Typology Code available for payment source | Attending: Emergency Medicine | Admitting: Emergency Medicine

## 2017-01-15 ENCOUNTER — Telehealth (HOSPITAL_COMMUNITY): Payer: Self-pay

## 2017-01-15 ENCOUNTER — Encounter (HOSPITAL_COMMUNITY): Payer: Self-pay | Admitting: Emergency Medicine

## 2017-01-15 DIAGNOSIS — G43109 Migraine with aura, not intractable, without status migrainosus: Secondary | ICD-10-CM

## 2017-01-15 DIAGNOSIS — Z79899 Other long term (current) drug therapy: Secondary | ICD-10-CM | POA: Insufficient documentation

## 2017-01-15 DIAGNOSIS — G43809 Other migraine, not intractable, without status migrainosus: Secondary | ICD-10-CM | POA: Insufficient documentation

## 2017-01-15 LAB — URINALYSIS, ROUTINE W REFLEX MICROSCOPIC
Bilirubin Urine: NEGATIVE
Glucose, UA: NEGATIVE mg/dL
Hgb urine dipstick: NEGATIVE
Ketones, ur: NEGATIVE mg/dL
Leukocytes, UA: NEGATIVE
Nitrite: NEGATIVE
Protein, ur: NEGATIVE mg/dL
Specific Gravity, Urine: 1.004 — ABNORMAL LOW (ref 1.005–1.030)
pH: 6 (ref 5.0–8.0)

## 2017-01-15 LAB — CBC WITH DIFFERENTIAL/PLATELET
Basophils Absolute: 0 10*3/uL (ref 0.0–0.1)
Basophils Relative: 0 %
Eosinophils Absolute: 0 10*3/uL (ref 0.0–0.7)
Eosinophils Relative: 1 %
HCT: 33.8 % — ABNORMAL LOW (ref 36.0–46.0)
Hemoglobin: 10.7 g/dL — ABNORMAL LOW (ref 12.0–15.0)
Lymphocytes Relative: 44 %
Lymphs Abs: 2.4 10*3/uL (ref 0.7–4.0)
MCH: 23.6 pg — ABNORMAL LOW (ref 26.0–34.0)
MCHC: 31.7 g/dL (ref 30.0–36.0)
MCV: 74.6 fL — ABNORMAL LOW (ref 78.0–100.0)
Monocytes Absolute: 0.5 10*3/uL (ref 0.1–1.0)
Monocytes Relative: 9 %
Neutro Abs: 2.4 10*3/uL (ref 1.7–7.7)
Neutrophils Relative %: 46 %
Platelets: 323 10*3/uL (ref 150–400)
RBC: 4.53 MIL/uL (ref 3.87–5.11)
RDW: 16.1 % — ABNORMAL HIGH (ref 11.5–15.5)
WBC: 5.4 10*3/uL (ref 4.0–10.5)

## 2017-01-15 LAB — BASIC METABOLIC PANEL
Anion gap: 10 (ref 5–15)
BUN: 6 mg/dL (ref 6–20)
CO2: 25 mmol/L (ref 22–32)
Calcium: 9.2 mg/dL (ref 8.9–10.3)
Chloride: 100 mmol/L — ABNORMAL LOW (ref 101–111)
Creatinine, Ser: 0.89 mg/dL (ref 0.44–1.00)
GFR calc Af Amer: >60 mL/min (ref >60)
GFR calc non Af Amer: >60 mL/min (ref >60)
Glucose, Bld: 90 mg/dL (ref 65–99)
Potassium: 3.7 mmol/L (ref 3.5–5.1)
Sodium: 135 mmol/L (ref 135–145)

## 2017-01-15 NOTE — ED Provider Notes (Signed)
TIME SEEN: 11:48 PM  CHIEF COMPLAINT: Headache  HPI: Pt is a 41 y.o. female with history of GERD, constipation and a well-documented history of migraine headaches who is being followed by neurology as an outpatient who presents to the emergency department with diffuse throbbing headache. She appears to have photophobia on exam. Reports nausea and vomiting. No sudden onset, thunderclap headache. No worst headache of her life. Patient is unfortunately a very poor historian. She reports numbness in her right face and right leg but she thinks may have started around 3 PM. She has no focal weakness. No head injury. Not on antiplatelets or anticoagulants. No fevers, neck pain or neck stiffness. She is unable to tell me if this feels similar to her prior migraines. Initially she denies ever having a history of migraines but it is well-documented in her chart.  ROS: See HPI Constitutional: no fever  Eyes: no drainage  ENT: no runny nose   Cardiovascular:  no chest pain  Resp: no SOB  GI: no vomiting GU: no dysuria Integumentary: no rash  Allergy: no hives  Musculoskeletal: no leg swelling  Neurological: no slurred speech ROS otherwise negative  PAST MEDICAL HISTORY/PAST SURGICAL HISTORY:  Past Medical History:  Diagnosis Date  . Anemia   . Constipation   . GERD (gastroesophageal reflux disease)   . Hx of endometriosis   . Macromastia 07/2011  . Migraine     MEDICATIONS:  Prior to Admission medications   Medication Sig Start Date End Date Taking? Authorizing Provider  acetaminophen (TYLENOL) 500 MG tablet Take 1 tablet (500 mg total) by mouth every 6 (six) hours as needed for mild pain (pain). 07/24/16   Massie MaroonHollis, Lachina M, FNP  albuterol (PROVENTIL HFA;VENTOLIN HFA) 108 (90 Base) MCG/ACT inhaler Inhale 1-2 puffs into the lungs every 6 (six) hours as needed for wheezing or shortness of breath (cough). 12/05/16   Bing NeighborsHarris, Kimberly S, FNP  cetirizine (ZYRTEC) 10 MG tablet Take 1 tablet (10 mg  total) by mouth daily. 07/24/16   Massie MaroonHollis, Lachina M, FNP  docusate sodium (COLACE) 100 MG capsule Take 100 mg by mouth 2 (two) times daily as needed for moderate constipation.     [provider]  esomeprazole (NEXIUM) 40 MG capsule Take 1 capsule (40 mg total) by mouth every morning. 06/26/16   Massie MaroonHollis, Lachina M, FNP  fluticasone (FLONASE) 50 MCG/ACT nasal spray Place 2 sprays into both nostrils daily. Patient taking differently: Place 2 sprays into both nostrils 4 (four) times a week.  11/25/16   Horton, Mayer Maskerourtney F, MD  ibuprofen (ADVIL,MOTRIN) 600 MG tablet Take 1 tablet (600 mg total) by mouth every 6 (six) hours as needed. 11/09/16   Fayrene Helperran, Bowie, PA-C  magic mouthwash w/lidocaine SOLN Take 5 mLs by mouth 3 (three) times daily as needed for mouth pain. 12/13/16   Dorena BodoKennard, Lawrence, NP  triamcinolone cream (KENALOG) 0.1 % Apply 1 application topically 2 (two) times daily. 12/30/16   Bing NeighborsHarris, Kimberly S, FNP  zolpidem (AMBIEN) 5 MG tablet Take 1 tablet (5 mg total) by mouth at bedtime as needed for sleep. 01/08/17   Bing NeighborsHarris, Kimberly S, FNP    ALLERGIES:  No Known Allergies  SOCIAL HISTORY:  Social History  Substance Use Topics  . Smoking status: Never Smoker  . Smokeless tobacco: Never Used  . Alcohol use No    FAMILY HISTORY: Family History  Problem Relation Age of Onset  . Diabetes Mother   . Hypertension Mother   . Thyroid disease Sister   .  Heart attack Maternal Grandmother   . Stroke Maternal Grandfather     EXAM: BP 134/86 (BP Location: Left Arm)   Pulse 91   Temp 98.8 F (37.1 C) (Oral)   Resp 16   Ht  (1.626 m)   Wt 93 kg (205 lb)   LMP 12/27/2003   SpO2 100%   BMI 35.19 kg/m  CONSTITUTIONAL: Alert and oriented and responds appropriately to questions. Well-appearing; well-nourished HEAD: Normocephalic EYES: Conjunctivae clear, pupils appear equal, EOMI ENT: normal nose; moist mucous membranes NECK: Supple, no meningismus, no nuchal rigidity, no LAD   CARD: RRR; S1 and S2 appreciated; no murmurs, no clicks, no rubs, no gallops RESP: Normal chest excursion without splinting or tachypnea; breath sounds clear and equal bilaterally; no wheezes, no rhonchi, no rales, no hypoxia or respiratory distress, speaking full sentences ABD/GI: Normal bowel sounds; non-distended; soft, non-tender, no rebound, no guarding, no peritoneal signs, no hepatosplenomegaly BACK:  The back appears normal and is non-tender to palpation, there is no CVA tenderness EXT: Normal ROM in all joints; non-tender to palpation; no edema; normal capillary refill; no cyanosis, no calf tenderness or swelling    SKIN: Normal color for age and race; warm; no rash NEURO: Moves all extremities equally, Strength 5/5 in all 4 Views, cranial nerves II through XII intact, normal speech, no dysmetria to finger to nose testing bilaterally, slightly diminished sensation in the right face and right arm compared to the left side, normal sensation in the legs bilaterally PSYCH: The patient's mood and manner are appropriate. Grooming and personal hygiene are appropriate.  MEDICAL DECISION MAKING: Patient here with what seems like a complicated migraine. She has no risk factor for stroke. We'll give migraine cocktail. Labs obtained in triage are unremarkable. We'll obtain EKG but also an MRI of her brain without contrast. I do not think that this is a cavernous sinus thrombosis or an intracranial hemorrhage. I do not feel she needs a lumbar puncture. Doubt meningitis. We'll discuss with neurology on call.  ED PROGRESS:     12:10 AM  D/w Dr. Laurence Slate with Neurology who agrees with MRI of patient's brain.  He agrees likely complicated migraine given patient's history. If MRI is positive, I will contact him for evaluation the patient in the emergency department. If MRI negative, patient will likely be discharged home when symptomatic relief achieved.   2:40 AM  Pt's headache is down to a 5/10 after  Reglan, Benadryl and IV fluids. MRI brain shows no acute abnormality. Will give dose of IV Toradol and reassess. EKG normal. Suspect complex migraine.   4:00 AM  Pt's headache completely resolved after Toradol. I feel she is safe for discharge home and follow-up with her outpatient neurologist.   At this time, I do not feel there is any life-threatening condition present. I have reviewed and discussed all results (EKG, imaging, lab, urine as appropriate) and exam findings with patient/family. I have reviewed nursing notes and appropriate previous records.  I feel the patient is safe to be discharged home without further emergent workup and can continue workup as an outpatient as needed. Discussed usual and customary return precautions. Patient/family verbalize understanding and are comfortable with this plan.  Outpatient follow-up has been provided if needed. All questions have been answered.   EKG Interpretation  Date/Time:  Thursday January 16 2017 02:42:55 EDT Ventricular Rate:  69 PR Interval:    QRS Duration: 108 QT Interval:  417 QTC Calculation: 447 R Axis:   80  Text Interpretation:  Sinus rhythm Confirmed by Breniyah Romm, Baxter Hire [phone]) on 01/16/2017 2:52:08 AM          Janyia Guion, Layla Maw, DO 01/16/17 0400

## 2017-01-15 NOTE — ED Triage Notes (Signed)
Pt. reports migraine headache onset 2 days ago with nausea , emesis x1 and photophobia unrelieved by OTC Tylenol and Ibuprofen , denies fever or chills .

## 2017-01-16 ENCOUNTER — Emergency Department (HOSPITAL_COMMUNITY): Payer: No Typology Code available for payment source

## 2017-01-16 MED ORDER — SODIUM CHLORIDE 0.9 % IV BOLUS (SEPSIS)
1000.0000 mL | Freq: Once | INTRAVENOUS | Status: AC
  Administered 2017-01-16: 1000 mL via INTRAVENOUS

## 2017-01-16 MED ORDER — DIPHENHYDRAMINE HCL 50 MG/ML IJ SOLN
25.0000 mg | Freq: Once | INTRAMUSCULAR | Status: AC
  Administered 2017-01-16: 25 mg via INTRAVENOUS
  Filled 2017-01-16: qty 1

## 2017-01-16 MED ORDER — METOCLOPRAMIDE HCL 5 MG/ML IJ SOLN
10.0000 mg | Freq: Once | INTRAMUSCULAR | Status: AC
  Administered 2017-01-16: 10 mg via INTRAVENOUS
  Filled 2017-01-16: qty 2

## 2017-01-16 MED ORDER — KETOROLAC TROMETHAMINE 30 MG/ML IJ SOLN
30.0000 mg | Freq: Once | INTRAMUSCULAR | Status: AC
  Administered 2017-01-16: 30 mg via INTRAVENOUS
  Filled 2017-01-16: qty 1

## 2017-01-20 NOTE — Telephone Encounter (Signed)
Please fax FMLA paperwork and attach all office notes from august until now.   Godfrey Pick. Tiburcio Pea, MSN, FNP-C [facility]  [address] Sherian Maroon [place], [place] [address] [phone]

## 2017-01-24 ENCOUNTER — Ambulatory Visit (HOSPITAL_COMMUNITY)
Admission: RE | Admit: 2017-01-24 | Discharge: 2017-01-24 | Disposition: A | Discharge status: Home / Self Care | Payer: No Typology Code available for payment source | Source: Ambulatory Visit | Attending: Cardiovascular Disease | Admitting: Cardiovascular Disease

## 2017-01-24 DIAGNOSIS — R079 Chest pain, unspecified: Secondary | ICD-10-CM

## 2017-01-27 LAB — EXERCISE TOLERANCE TEST
Estimated workload: 11 METS
Exercise duration (min): 9 min
Exercise duration (sec): 35 s
MPHR: 179 {beats}/min
Peak HR: 179 {beats}/min
Percent HR: 100 %
Percent of predicted max HR: 100 %
RPE: 18
Rest HR: 72 {beats}/min
Stage 1 DBP: 96 mmHg
Stage 1 Grade: 0 %
Stage 1 HR: 77 {beats}/min
Stage 1 SBP: 139 mmHg
Stage 1 Speed: 0 mph
Stage 2 Grade: 0 %
Stage 2 HR: 78 {beats}/min
Stage 2 Speed: 0.6 mph
Stage 3 Grade: 0 %
Stage 3 HR: 78 {beats}/min
Stage 3 Speed: 1 mph
Stage 4 DBP: 83 mmHg
Stage 4 Grade: 10 %
Stage 4 HR: 118 {beats}/min
Stage 4 SBP: 156 mmHg
Stage 4 Speed: 1.7 mph
Stage 5 DBP: 81 mmHg
Stage 5 Grade: 12 %
Stage 5 HR: 148 {beats}/min
Stage 5 SBP: 132 mmHg
Stage 5 Speed: 2.5 mph
Stage 6 DBP: 82 mmHg
Stage 6 Grade: 14 %
Stage 6 HR: 176 {beats}/min
Stage 6 SBP: 157 mmHg
Stage 6 Speed: 3.4 mph
Stage 7 Grade: 16 %
Stage 7 HR: 179 {beats}/min
Stage 7 Speed: 4.2 mph
Stage 8 DBP: 74 mmHg
Stage 8 Grade: 0 %
Stage 8 HR: 153 {beats}/min
Stage 8 SBP: 130 mmHg
Stage 8 Speed: 0 mph
Stage 9 DBP: 93 mmHg
Stage 9 Grade: 0 %
Stage 9 HR: 98 {beats}/min
Stage 9 SBP: 147 mmHg
Stage 9 Speed: 0 mph

## 2017-01-30 ENCOUNTER — Encounter: Payer: Self-pay | Admitting: Family Medicine

## 2017-02-04 ENCOUNTER — Other Ambulatory Visit (INDEPENDENT_AMBULATORY_CARE_PROVIDER_SITE_OTHER): Payer: No Typology Code available for payment source

## 2017-02-04 ENCOUNTER — Ambulatory Visit (INDEPENDENT_AMBULATORY_CARE_PROVIDER_SITE_OTHER): Payer: Self-pay | Admitting: Gastroenterology

## 2017-02-04 ENCOUNTER — Encounter: Payer: Self-pay | Admitting: Gastroenterology

## 2017-02-04 ENCOUNTER — Other Ambulatory Visit: Payer: No Typology Code available for payment source

## 2017-02-04 VITALS — BP 110/76 | HR 78 | Ht 64.0 in | Wt 214.0 lb

## 2017-02-04 DIAGNOSIS — D509 Iron deficiency anemia, unspecified: Secondary | ICD-10-CM

## 2017-02-04 DIAGNOSIS — R932 Abnormal findings on diagnostic imaging of liver and biliary tract: Secondary | ICD-10-CM

## 2017-02-04 DIAGNOSIS — R070 Pain in throat: Secondary | ICD-10-CM

## 2017-02-04 LAB — FERRITIN: Ferritin: 32.2 ng/mL (ref 10.0–291.0)

## 2017-02-04 LAB — IBC PANEL
Iron: 21 ug/dL — ABNORMAL LOW (ref 42–145)
Saturation Ratios: 4.5 % — ABNORMAL LOW (ref 20.0–50.0)
Transferrin: 334 mg/dL (ref 212.0–360.0)

## 2017-02-04 NOTE — Addendum Note (Signed)
Addended by: Jazzy Parmer P on: 02/04/2017 03:54 PM   Modules accepted: Level of Service  

## 2017-02-04 NOTE — Patient Instructions (Signed)
You have been scheduled for an MRI at 8:00am on 02-10-17. Please arrive 15 minutes prior to your appointment time for registration purposes. Please make certain not to have anything to eat or drink 6 hours prior to your test. In addition, if you have any metal in your body, have a pacemaker or defibrillator, please be sure to let your ordering physician know. This test typically takes 45 minutes to 1 hour to complete. Should you need to reschedule, please call [phone] to do so.   Your physician has requested that you go to the basement for the following lab work before leaving today: IBC panel and Ferritin  If you are age 85 or older, your body mass index should be between 23-30. Your Body mass index is 36.73 kg/m. If this is out of the aforementioned range listed, please consider follow up with your Primary Care Provider.  If you are age 42 or younger, your body mass index should be between 19-25. Your Body mass index is 36.73 kg/m. If this is out of the aformentioned range listed, please consider follow up with your Primary Care Provider.   We will refer you to an ENT physician and will be in touch regarding an appointment.  If you have not heard from [place] or them within a week or two please call us at [phone].   Thank you.

## 2017-02-04 NOTE — Progress Notes (Signed)
HPI :  41 y/o female with a history of GERD, migraine headaches, atypical chest pain, referred for a new patient visit from Joaquin Courts FNP for reported globus  She complains of "pain" in her throat. She thinks started in April and it comes and goes. Lasts for a few hours at a time and then goes away. She thinks she has noted change in her voice, raspy / hoarse voice along with it. No trouble with eating at all now. She initially had some discomfort with swallowing a pill once, or something making her throat hurt with swallows, however her symptoms appear to be mostly unrelated to eating at this point. She denies any globus or sensation of something there. She thinks she notices it more the more she talks. No prior tobacco use.   She takes nexium  once daily. She thinks it works well for her heartburn. No heartburn that bothers her at this time. No vomiting. Eating well, no weight loss. No abdominal pains. She has not seen an ENT physician previously. She denies a tobacco history. Of note, she has had an extensive workup at [facility] in 2015 to include negative EGD, manometry, pH study, gastric emptying study, EGG, hydrogen breath test.  She underwent stress test per cardiology which was normal on 01/24/17. This was done for history of chest pains and shortness of breath which has been occurring. She has follow up with cardiology scheduled in the near future for this issue.   Of note she had a CT scan of her abdomen in 11/2013 - showing 3cm solid lesion of the liver, indeterminate. Also ascites noted on this exam. Told to have a follow up MRI which she has not had done. She denies any pain in the RUQ. LFTs normal on 12/23/16.   She denies any trouble with her bowels. No blood in the stools. Sometimes stool form changes. She does not have menses due to hysterectomy. She has a mild anemia, Hgb around 10, MCV 75, no prior iron studies. She denies NSAID use.  Recent workup: CT scan neck 12/02/16 -  normal CT angio of chest 12/02/2016 - normal EGD 08/19/2013 - Dr. Alycia Rossetti - normal esophagus, mild gastritis Esophageal manometry 2015 - normal PH study 2015 - normal exam, Demeester score of 5.7, positive symptom index for regurgitation and reflux EGG normal 2015 Normal hydrogen breath test 2015 Normal gastric emptying scan 2015  Past Medical History:  Diagnosis Date  . Anemia   . Constipation   . GERD (gastroesophageal reflux disease)   . Hx of endometriosis   . Macromastia 07/2011  . Migraine      Past Surgical History:  Procedure Laterality Date  . ABDOMINAL HYSTERECTOMY  12/12/2003  . ADENOIDECTOMY  05/2011  . ANKLE ARTHROSCOPY  12/29/2007   right; with extensive debridement  . BLADDER NECK RECONSTRUCTION  01/14/2011   Procedure: BLADDER NECK REPAIR;  Surgeon: Reva Bores, MD;  Location: [facility];  Service: Gynecology;  Laterality: N/A;  Laparoscopic Repair of Incidental Cystotomy  . BREAST REDUCTION SURGERY  08/05/2011   Procedure: MAMMARY REDUCTION  (BREAST);  Surgeon: Louisa Second, MD;  Location: [facility];  Service: Plastics;  Laterality: Bilateral;  bilateral  . CESAREAN SECTION  12/29/2000; 1994  . DILATION AND CURETTAGE OF UTERUS  07/08/2003   open laparoscopy  . LAPAROSCOPIC SALPINGOOPHERECTOMY  01/14/2011   left  . REPAIR PERONEAL TENDONS ANKLE  01/03/2010   repair right subluxing peroneal tendons  . RIGHT OOPHORECTOMY  with lysis of adhesions  . SALPINGECTOMY  12/12/2003   right; with TAH  . TONSILLECTOMY  as a child  . TUBAL LIGATION  12/29/2000   Family History  Problem Relation Age of Onset  . Diabetes Mother   . Hypertension Mother   . Thyroid disease Sister   . Heart attack Maternal Grandmother   . Stroke Maternal Grandfather    Social History  Substance Use Topics  . Smoking status: Never Smoker  . Smokeless tobacco: Never Used  . Alcohol use No   Current Outpatient Prescriptions  Medication Sig Dispense Refill  . acetaminophen  (TYLENOL) 500 MG tablet Take 1 tablet (500 mg total) by mouth every 6 (six) hours as needed for mild pain (pain). 30 tablet 0  . albuterol (PROVENTIL HFA;VENTOLIN HFA) 108 (90 Base) MCG/ACT inhaler Inhale 1-2 puffs into the lungs every 6 (six) hours as needed for wheezing or shortness of breath (cough). 1 Inhaler 2  . cetirizine (ZYRTEC) 10 MG tablet Take 1 tablet (10 mg total) by mouth daily. 30 tablet 11  . docusate sodium (COLACE) 100 MG capsule Take 100 mg by mouth 2 (two) times daily as needed for moderate constipation.     [name] esomeprazole (NEXIUM) 40 MG capsule Take 1 capsule (40 mg total) by mouth every morning. 90 capsule 1  . fluticasone (FLONASE) 50 MCG/ACT nasal spray Place 2 sprays into both nostrils daily. (Patient taking differently: Place 2 sprays into both nostrils 4 (four) times a week. ) 16 g 0  . ibuprofen (ADVIL,MOTRIN) 600 MG tablet Take 1 tablet (600 mg total) by mouth every 6 (six) hours as needed. 30 tablet 0  . triamcinolone cream (KENALOG) 0.1 % Apply 1 application topically 2 (two) times daily. 30 g 0  . zolpidem (AMBIEN) 5 MG tablet Take 1 tablet (5 mg total) by mouth at bedtime as needed for sleep. 30 tablet 0   No current facility-administered medications for this visit.    No Known Allergies   Review of Systems: All systems reviewed and negative except where noted in HPI.   Lab Results  Component Value Date   WBC 5.4 01/15/2017   HGB 10.7 (L) 01/15/2017   HCT 33.8 (L) 01/15/2017   MCV 74.6 (L) 01/15/2017   PLT 323 01/15/2017    Lab Results  Component Value Date   CREATININE 0.89 01/15/2017   BUN 6 01/15/2017   NA 135 01/15/2017   K 3.7 01/15/2017   CL 100 (L) 01/15/2017   CO2 25 01/15/2017    Lab Results  Component Value Date   ALT 12 12/23/2016   AST 17 12/23/2016   ALKPHOS 59 12/23/2016   BILITOT 0.3 12/23/2016      Physical Exam: BP 110/76   Pulse 78   Ht  (1.626 m)   Wt 214 lb (97.1 kg)   LMP 12/27/2003   BMI 36.73 kg/m   Constitutional: Pleasant,well-developed, female in no acute distress. HEENT: Normocephalic and atraumatic. Conjunctivae are normal. No scleral icterus. Neck supple.  Cardiovascular: Normal rate, regular rhythm.  Pulmonary/chest: Effort normal and breath sounds normal. No wheezing, rales or rhonchi. Abdominal: Soft, nondistended, nontender.. There are no masses palpable. No hepatomegaly. Extremities: no edema Lymphadenopathy: No cervical adenopathy noted. Neurological: Alert and oriented to person place and time. Skin: Skin is warm and dry. No rashes noted. Psychiatric: Normal mood and affect. Behavior is normal.   ASSESSMENT AND PLAN: 41 year old female here for new patient visit to discuss the following issues:  Throat pain - intermittent throat pain with voice changes, history not consistent with globus as she reports it today. Her Nexium is currently working quite well to control reflux symptoms, she can consider increasing this to twice a day but I think her throat pain is likely unrelated. She's had a prior EGD, manometry, pH testing which did not show any significant pathology. I think her referral to ENT for laryngoscopy might be reasonable given her symptoms. I discussed this with her and place referral. If she wants to empirically increase Nexium for a few weeks to twice a day she can see if this helps as discussed.   Microcytic anemia - this appears stable and chronic, no prior iron studies that I can see in her records that are available today. She has a history of hysterectomy. We'll check iron studies, if she has an iron deficiency or recommend colonoscopy and consideration for EGD.   Abnormal liver imaging - 3 cm indeterminate solid liver lesion noted on a prior CT scan in 2015. Radiology recommended a follow-up liver MRI at that time which she did not have done. Her LFTs are normal. He has no symptoms in her right upper quadrant at present. Given appearance of this lesion in 2015  I do think MRI of the liver is reasonable to reevaluate it.   Patient was in agreement with the plan as outlined, all questions answered  Ileene Patrick, MD [facility] Pager [phone]  CC: Bing Neighbors, FNP

## 2017-02-05 ENCOUNTER — Other Ambulatory Visit: Payer: Self-pay

## 2017-02-05 ENCOUNTER — Other Ambulatory Visit: Payer: Self-pay | Admitting: Family Medicine

## 2017-02-05 ENCOUNTER — Telehealth: Payer: Self-pay

## 2017-02-05 DIAGNOSIS — K219 Gastro-esophageal reflux disease without esophagitis: Secondary | ICD-10-CM

## 2017-02-05 MED ORDER — FERROUS SULFATE 325 (65 FE) MG PO TBEC: 325.0000 mg | DELAYED_RELEASE_TABLET | Freq: Two times a day (BID) | ORAL | 3 refills | Status: DC

## 2017-02-05 NOTE — Telephone Encounter (Signed)
Dr. Adela Lank referred pt to ENT for throat pain.  [facility], Dr. Haroldine Laws was sent referral on 02-05-17.  Diannia Ruder is contact at [phone].  They will contact pt to schedule appt and will let us know when scheduled. Pt has [place] card.

## 2017-02-06 ENCOUNTER — Other Ambulatory Visit: Payer: Self-pay

## 2017-02-07 NOTE — Telephone Encounter (Signed)
[place] with Dr. Allayne Stack office called: Pt has appt on Monday, Oct 15 at 9:00am.  Patient is aware of appt.

## 2017-02-08 ENCOUNTER — Emergency Department (HOSPITAL_COMMUNITY)
Admission: EM | Admit: 2017-02-08 | Discharge: 2017-02-09 | Disposition: A | Discharge status: Home / Self Care | Payer: Self-pay | Attending: Emergency Medicine | Admitting: Emergency Medicine

## 2017-02-08 ENCOUNTER — Emergency Department (HOSPITAL_COMMUNITY): Payer: Self-pay

## 2017-02-08 DIAGNOSIS — R200 Anesthesia of skin: Secondary | ICD-10-CM | POA: Insufficient documentation

## 2017-02-08 DIAGNOSIS — R0789 Other chest pain: Secondary | ICD-10-CM | POA: Insufficient documentation

## 2017-02-08 DIAGNOSIS — Z79899 Other long term (current) drug therapy: Secondary | ICD-10-CM | POA: Insufficient documentation

## 2017-02-08 DIAGNOSIS — R110 Nausea: Secondary | ICD-10-CM | POA: Insufficient documentation

## 2017-02-08 DIAGNOSIS — R079 Chest pain, unspecified: Secondary | ICD-10-CM

## 2017-02-08 DIAGNOSIS — R52 Pain, unspecified: Secondary | ICD-10-CM

## 2017-02-08 DIAGNOSIS — M542 Cervicalgia: Secondary | ICD-10-CM

## 2017-02-08 LAB — COMPREHENSIVE METABOLIC PANEL
ALT: 16 U/L (ref 14–54)
AST: 25 U/L (ref 15–41)
Albumin: 4 g/dL (ref 3.5–5.0)
Alkaline Phosphatase: 65 U/L (ref 38–126)
Anion gap: 8 (ref 5–15)
BUN: 5 mg/dL — ABNORMAL LOW (ref 6–20)
CO2: 28 mmol/L (ref 22–32)
Calcium: 9.1 mg/dL (ref 8.9–10.3)
Chloride: 102 mmol/L (ref 101–111)
Creatinine, Ser: 0.88 mg/dL (ref 0.44–1.00)
GFR calc Af Amer: >60 mL/min (ref >60)
GFR calc non Af Amer: >60 mL/min (ref >60)
Glucose, Bld: 119 mg/dL — ABNORMAL HIGH (ref 65–99)
Potassium: 3.5 mmol/L (ref 3.5–5.1)
Sodium: 138 mmol/L (ref 135–145)
Total Bilirubin: 0.6 mg/dL (ref 0.3–1.2)
Total Protein: 7.6 g/dL (ref 6.5–8.1)

## 2017-02-08 LAB — LIPASE, BLOOD: Lipase: 30 U/L (ref 11–51)

## 2017-02-08 LAB — CBC
HCT: 33.2 % — ABNORMAL LOW (ref 36.0–46.0)
Hemoglobin: 10.4 g/dL — ABNORMAL LOW (ref 12.0–15.0)
MCH: 23.2 pg — ABNORMAL LOW (ref 26.0–34.0)
MCHC: 31.3 g/dL (ref 30.0–36.0)
MCV: 74.1 fL — ABNORMAL LOW (ref 78.0–100.0)
Platelets: 319 10*3/uL (ref 150–400)
RBC: 4.48 MIL/uL (ref 3.87–5.11)
RDW: 15.2 % (ref 11.5–15.5)
WBC: 6.2 10*3/uL (ref 4.0–10.5)

## 2017-02-08 LAB — I-STAT BETA HCG BLOOD, ED (MC, WL, AP ONLY): I-stat hCG, quantitative: <5 m[IU]/mL (ref <5)

## 2017-02-08 LAB — TROPONIN I: Troponin I: <0.03 ng/mL (ref <0.03)

## 2017-02-08 MED ORDER — SODIUM CHLORIDE 0.9 % IV BOLUS (SEPSIS)
1000.0000 mL | Freq: Once | INTRAVENOUS | Status: AC
  Administered 2017-02-08: 1000 mL via INTRAVENOUS

## 2017-02-08 MED ORDER — KETOROLAC TROMETHAMINE 30 MG/ML IJ SOLN
30.0000 mg | Freq: Once | INTRAMUSCULAR | Status: AC
  Administered 2017-02-09: 30 mg via INTRAVENOUS
  Filled 2017-02-08: qty 1

## 2017-02-08 NOTE — ED Triage Notes (Signed)
She has had these symptoms for 3-4 days  She thinks that the sun is causing her problems

## 2017-02-08 NOTE — ED Triage Notes (Signed)
Generalized body chest abd neck head and scattered bruises over her body with chills and fever  lmp none

## 2017-02-08 NOTE — ED Provider Notes (Signed)
[facility]-EMERGENCY DEPT Provider Note   CSN: [id number] Arrival date & time: 02/08/17  2102     History   Chief Complaint Chief Complaint  Patient presents with  . Generalized Body Aches    HPI [name] is a 41 y.o. female personal history of anemia, GERD who presents with multiple complaints. Patient reports for the last 3-4 days, she has had generalized body aches throughout her entire body. Patient also reports that for the last 3 days, she has had midsternal chest pain that she describes as a "tightness and squeezing." She states that it is worse with deep inspiration and exertion. She reports that she had some diaphoresis but not when she was having chest pain. She also reports generalized nausea but again not when she was specifically having chest pain. She denies any vomiting. Patient states that she took Tylenol for pain with no improvement in symptoms. Patient also reports that she has been having some posterior neck pain. Patient also reports that 2 days ago she had some right upper extremity numbness but states that that has since resolved. Patient states that she has been ambulating without difficulty. Patient also reports that she has had intermittent abdominal cramps but no abdominal pain currently. Patient denies any trauma, fall, injury. Denies fevers, weight loss, weakness of upper and lower extremities, bowel/bladder incontinence, saddle anesthesia, history of back surgery, history of IVDA. She denies any OCP use, recent immobilization, prior history of DVT/PE, recent surgery, leg swelling, or long travel. She patient denies any fever, abdominal pain, vomiting, dysuria, hematuria.   The history is provided by the patient.    Past Medical History:  Diagnosis Date  . Anemia   . Constipation   . GERD (gastroesophageal reflux disease)   . Hx of endometriosis   . Macromastia 07/2011  . Migraine     Patient Active Problem List   Diagnosis Date Noted  . ANA positive  12/30/2016  . Gastroesophageal reflux disease 06/27/2016  . Disturbance of skin sensation 04/27/2014  . Dizziness and giddiness 03/14/2014  . Headache, migraine 03/14/2014  . Myalgia and myositis, unspecified 06/07/2013  . Nausea and vomiting in adult 08/22/2012  . Viral syndrome 08/22/2012  . Pelvic pain in female 12/18/2010  . PID (acute pelvic inflammatory disease) 12/18/2010  . ANEMIA-IRON DEFICIENCY 10/15/2006  . HERPES, GENITAL NOS 09/30/2006  . OBESITY NOS 09/30/2006  . DISORDER, NONORGANIC SLEEP NOS 09/30/2006  . CONSTIPATION NOS 09/30/2006  . NEURITIS, LUMBOSACRAL NOS 09/30/2006  . HX, PERSONAL, MENTAL DISORDER NOS 09/30/2006    Past Surgical History:  Procedure Laterality Date  . ABDOMINAL HYSTERECTOMY  12/12/2003  . ADENOIDECTOMY  05/2011  . ANKLE ARTHROSCOPY  12/29/2007   right; with extensive debridement  . BLADDER NECK RECONSTRUCTION  01/14/2011   Procedure: BLADDER NECK REPAIR;  Surgeon: Reva Bores, MD;  Location: [facility];  Service: Gynecology;  Laterality: N/A;  Laparoscopic Repair of Incidental Cystotomy  . BREAST REDUCTION SURGERY  08/05/2011   Procedure: MAMMARY REDUCTION  (BREAST);  Surgeon: Louisa Second, MD;  Location: [facility];  Service: Plastics;  Laterality: Bilateral;  bilateral  . CESAREAN SECTION  12/29/2000; 1994  . DILATION AND CURETTAGE OF UTERUS  07/08/2003   open laparoscopy  . LAPAROSCOPIC SALPINGOOPHERECTOMY  01/14/2011   left  . REPAIR PERONEAL TENDONS ANKLE  01/03/2010   repair right subluxing peroneal tendons  . RIGHT OOPHORECTOMY     with lysis of adhesions  . SALPINGECTOMY  12/12/2003   right; with TAH  .  TONSILLECTOMY  as a child  . TUBAL LIGATION  12/29/2000    OB History    Gravida Para Term Preterm AB Living   SAB TAB Ectopic Multiple Live Births                   Home Medications    Prior to Admission medications   Medication Sig Start Date End Date Taking? Authorizing Provider  cetirizine  (ZYRTEC) 10 MG tablet Take 1 tablet (10 mg total) by mouth daily. 07/24/16  Yes Massie Maroon, FNP  esomeprazole (NEXIUM) 40 MG capsule TAKE 1 CAPSULE BY MOUTH EVERY MORNING 02/05/17  Yes Bing Neighbors, FNP  acetaminophen (TYLENOL) 500 MG tablet Take 1 tablet (500 mg total) by mouth every 6 (six) hours as needed for mild pain (pain). Patient not taking: Reported on 02/08/2017 07/24/16   Massie Maroon, FNP  albuterol (PROVENTIL HFA;VENTOLIN HFA) 108 (90 Base) MCG/ACT inhaler Inhale 1-2 puffs into the lungs every 6 (six) hours as needed for wheezing or shortness of breath (cough). Patient not taking: Reported on 02/08/2017 12/05/16   Bing Neighbors, FNP  ferrous sulfate 325 (65 FE) MG EC tablet Take 1 tablet (325 mg total) by mouth 2 (two) times daily with a meal. Patient not taking: Reported on 02/08/2017 02/05/17   Benancio Deeds, MD  fluticasone (FLONASE) 50 MCG/ACT nasal spray Place 2 sprays into both nostrils daily. Patient not taking: Reported on 02/08/2017 11/25/16   Horton, Mayer Masker, MD  ibuprofen (ADVIL,MOTRIN) 600 MG tablet Take 1 tablet (600 mg total) by mouth every 6 (six) hours as needed. Patient not taking: Reported on 02/08/2017 11/09/16   Fayrene Helper, PA-C  triamcinolone cream (KENALOG) 0.1 % Apply 1 application topically 2 (two) times daily. Patient not taking: Reported on 02/08/2017 12/30/16   Bing Neighbors, FNP  zolpidem (AMBIEN) 5 MG tablet Take 1 tablet (5 mg total) by mouth at bedtime as needed for sleep. Patient not taking: Reported on 02/08/2017 01/08/17   Bing Neighbors, FNP    Family History Family History  Problem Relation Age of Onset  . Diabetes Mother   . Hypertension Mother   . Thyroid disease Sister   . Heart attack Maternal Grandmother   . Stroke Maternal Grandfather     Social History Social History  Substance Use Topics  . Smoking status: Never Smoker  . Smokeless tobacco: Never Used  . Alcohol use No     Allergies   Patient has no  known allergies.   Review of Systems Review of Systems  Constitutional: Negative for chills and fever.  HENT: Negative for congestion.   Eyes: Negative for visual disturbance.  Respiratory: Negative for cough and shortness of breath.   Cardiovascular: Positive for chest pain.  Gastrointestinal: Negative for abdominal pain, diarrhea, nausea and vomiting.  Genitourinary: Negative for dysuria and hematuria.  Musculoskeletal: Positive for myalgias and neck pain. Negative for back pain.  Skin: Positive for color change. Negative for rash.  Neurological: Positive for numbness. Negative for dizziness, weakness and headaches.  Psychiatric/Behavioral: Negative for confusion.     Physical Exam Updated Vital Signs BP 121/81   Pulse 71   Temp 98.5 F (36.9 C) (Oral)   Resp (!) 22   LMP 12/27/2003   SpO2 100%   Physical Exam  Constitutional: She is oriented to person, place, and time. She appears well-developed and well-nourished.  HENT:  Head: Normocephalic and  atraumatic.  Mouth/Throat: Oropharynx is clear and moist and mucous membranes are normal.  Eyes: Pupils are equal, round, and reactive to light. Conjunctivae, EOM and lids are normal.  Neck: Full passive range of motion without pain.  Cardiovascular: Normal rate, regular rhythm, normal heart sounds and normal pulses.  Exam reveals no gallop and no friction rub.   No murmur heard. Pulmonary/Chest: Effort normal and breath sounds normal.  No evidence of respiratory distress. Able to speak in full sentences without difficulty. Tenderness to palpation to the anterior midsternal chest wall.   Abdominal: Soft. Normal appearance. There is no tenderness. There is no rigidity and no guarding.  Musculoskeletal: Normal range of motion.  Bilateral lower extremities are symmetric in appearance.  Neurological: She is alert and oriented to person, place, and time. GCS eye subscore is 4. GCS verbal subscore is 5. GCS motor subscore is 6.    Cranial nerves III-XII intact Follows commands, Moves all extremities  5/5 strength to BUE and BLE  Sensation intact throughout all major nerve distributions Normal finger to nose. No dysdiadochokinesia. Slight pronator drift of right upper extremity, this is likely due to patient's pre-existing pain and weakness in the right upper extremity. Patient states that the arm hurts when she attempts to hold it up in the air.  No gait abnormalities  No slurred speech. No facial droop.   Skin: Skin is warm and dry. Capillary refill takes less than 2 seconds.  Psychiatric: She has a normal mood and affect. Her speech is normal.  Nursing note and vitals reviewed.    ED Treatments / Results  Labs (all labs ordered are listed, but only abnormal results are displayed) Labs Reviewed  COMPREHENSIVE METABOLIC PANEL - Abnormal; Notable for the following:       Result Value   Glucose, Bld 119 (*)    BUN 5 (*)    All other components within normal limits  CBC - Abnormal; Notable for the following:    Hemoglobin 10.4 (*)    HCT 33.2 (*)    MCV 74.1 (*)    MCH 23.2 (*)    All other components within normal limits  LIPASE, BLOOD  TROPONIN I  I-STAT BETA HCG BLOOD, ED ([facility], [facility], [facility] ONLY)    EKG  EKG Interpretation  Date/Time:  Saturday February 08 2017 21:04:19 EDT Ventricular Rate:  88 PR Interval:  146 QRS Duration: 92 QT Interval:  350 QTC Calculation: 423 R Axis:   78 Text Interpretation:  Normal sinus rhythm Normal ECG Confirmed by Margarita Grizzle [phone]) on 02/08/2017 10:54:41 PM       Radiology Dg Chest 2 View  Result Date: 02/08/2017 CLINICAL DATA:  Generalized body ache and bruises times 3-4 days. No known injury. EXAM: CHEST  2 VIEW COMPARISON:  12/19/2016 FINDINGS: The heart size and mediastinal contours are within normal limits. Both lungs are clear. The visualized skeletal structures are unremarkable. IMPRESSION: No active cardiopulmonary disease. Electronically Signed   By:  Tollie Eth M.D.   On: 02/08/2017 23:34   Dg Cervical Spine Complete  Result Date: 02/08/2017 CLINICAL DATA:  Generalized body ache times 3-4 days. EXAM: CERVICAL SPINE - COMPLETE 4+ VIEW COMPARISON:  10/02/2012 MRI FINDINGS: There is no evidence of cervical spine fracture or prevertebral soft tissue swelling. Alignment is normal. Multilevel mild disc space narrowing from C2 through C7, greatest at C6-7 with small anterior osteophytes. Intact atlantodental interval and craniocervical relationship. IMPRESSION: Negative for acute cervical spine fracture or suspicious osseous lesions.  Mild multilevel disc space narrowing, more evident at C6-7. Electronically Signed   By: Tollie Eth M.D.   On: 02/08/2017 23:36    Procedures Procedures (including critical care time)  Medications Ordered in ED Medications  sodium chloride 0.9 % bolus 1,000 mL (0 mLs Intravenous Stopped 02/09/17 0013)  ketorolac (TORADOL) 30 MG/ML injection 30 mg (30 mg Intravenous Given 02/09/17 0014)     Initial Impression / Assessment and Plan / ED Course  I have reviewed the triage vital signs and the nursing notes.  Pertinent labs & imaging results that were available during my care of the patient were reviewed by me and considered in my medical decision making (see chart for details).     41 year old female who presents with multiple complaints. Concern for generalized body aches, neck pain, chest pain that has been ongoing for 3 days, right upper extremity numbness/pain. Patient is afebrile, non-toxic appearing, sitting comfortably on examination table. Vital signs reviewed and stable. Plan to check basic labs including CMP, CBC, lipase, troponin, EKG, chest x-ray.  Will also obtain C spine XR. Low suspicion for ACS etiology, though a consideration. Also consider acute infectious etiology. History/physical examination concerning for CVA. Analgesics provided in the department.  Records reviewed show patient has chronic right  upper extremity numbness and pain. She is currently denying any numbness now. Suspect that slight pronator drift on physical exam is likely secondary to chronic right arm pain.   Labs and imaging reviewed. CMP is unremarkable. CBC shows anemia. Records reviewed show this is consistent with previous. Lipase negative. Beta negative. Troponin negative. Cervical spine x-ray negative for any acute fracture dislocation. Chest x-ray negative for any acute infectious etiology. EKG is normal sinus rhythm, rate 88. Given that patient has had constant chest pain for 3 days, 1 negative troponin is sufficient to rule out ACS.  Discussed results with patient. She reports improvement in pain after pain medications. I personally ambulate patient in the department. She was able to ambulate without any difficulty. Repeat neuro exam improved. Patient reports pain when she tries to raise up that arm which causes straw. Do not suspect CVA at this time. Vital signs stable. Patient is stable for discharge at this time. Instructed her to follow up with her primary care doctor next 24-48 hours for further evaluation. Strict return precautions discussed. Patient expresses understanding and agreement to plan.     Final Clinical Impressions(s) / ED Diagnoses   Final diagnoses:  Generalized body aches  Neck pain  Chest pain, unspecified type    New Prescriptions Discharge Medication List as of 02/09/2017  1:04 AM       Maxwell Caul, PA-C 02/10/17 3244    Margarita Grizzle, MD 02/10/17 1144

## 2017-02-09 NOTE — Discharge Instructions (Signed)
You can take Tylenol or Ibuprofen as directed for pain. You can alternate Tylenol and Ibuprofen every 4 hours. If you take Tylenol at 1pm, then you can take Ibuprofen at 5pm. Then you can take Tylenol again at 9pm.   Follow-up with your primary care doctor in the next 24-48 hours for further evaluation.   Return the emergency Department for any worsening pain, difficulty breathing, chest pain, fevers, difficulty walking, urinary or bowel accidents, numbness in your groin or any other worsening or concerning symptoms.

## 2017-02-10 ENCOUNTER — Ambulatory Visit (HOSPITAL_COMMUNITY)
Admission: RE | Admit: 2017-02-10 | Discharge: 2017-02-10 | Disposition: A | Discharge status: Home / Self Care | Payer: No Typology Code available for payment source | Source: Ambulatory Visit | Attending: Gastroenterology | Admitting: Gastroenterology

## 2017-02-10 DIAGNOSIS — D509 Iron deficiency anemia, unspecified: Secondary | ICD-10-CM

## 2017-02-10 DIAGNOSIS — Z9049 Acquired absence of other specified parts of digestive tract: Secondary | ICD-10-CM | POA: Insufficient documentation

## 2017-02-10 DIAGNOSIS — R932 Abnormal findings on diagnostic imaging of liver and biliary tract: Secondary | ICD-10-CM | POA: Insufficient documentation

## 2017-02-10 MED ORDER — GADOBENATE DIMEGLUMINE 529 MG/ML IV SOLN
20.0000 mL | Freq: Once | INTRAVENOUS | Status: AC | PRN
  Administered 2017-02-10: 20 mL via INTRAVENOUS

## 2017-02-12 ENCOUNTER — Other Ambulatory Visit: Payer: Self-pay | Admitting: Family Medicine

## 2017-02-13 ENCOUNTER — Other Ambulatory Visit: Payer: Self-pay | Admitting: Family Medicine

## 2017-02-13 ENCOUNTER — Ambulatory Visit (AMBULATORY_SURGERY_CENTER): Payer: Self-pay | Admitting: *Deleted

## 2017-02-13 VITALS — Ht 64.0 in | Wt 214.5 lb

## 2017-02-13 DIAGNOSIS — R070 Pain in throat: Secondary | ICD-10-CM

## 2017-02-13 DIAGNOSIS — D509 Iron deficiency anemia, unspecified: Secondary | ICD-10-CM

## 2017-02-13 MED ORDER — NA SULFATE-K SULFATE-MG SULF 17.5-3.13-1.6 GM/177ML PO SOLN: 1.0000 [IU] | Freq: Once | ORAL | 0 refills | Status: AC

## 2017-02-13 NOTE — Progress Notes (Addendum)
No egg or soy allergy known to patient  No issues with past sedation with any surgeries  or procedures, no intubation problems  No diet pills per patient No home 02 use per patient  No blood thinners per patient  Pt denies issues with constipation not currently  No A fib or A flutter  EMMI video sent to pt's e mail Emmi was down at the visit.    Suprep sample given to patient  [id number] lot # Exp 7/20

## 2017-02-14 ENCOUNTER — Other Ambulatory Visit: Payer: Self-pay | Admitting: Family Medicine

## 2017-02-14 NOTE — Telephone Encounter (Signed)
Completed.

## 2017-02-19 ENCOUNTER — Emergency Department (HOSPITAL_COMMUNITY): Payer: No Typology Code available for payment source

## 2017-02-19 ENCOUNTER — Other Ambulatory Visit: Payer: Self-pay

## 2017-02-19 ENCOUNTER — Encounter (HOSPITAL_COMMUNITY): Payer: Self-pay | Admitting: *Deleted

## 2017-02-19 ENCOUNTER — Other Ambulatory Visit: Payer: Self-pay | Admitting: Family Medicine

## 2017-02-19 ENCOUNTER — Emergency Department (HOSPITAL_COMMUNITY)
Admission: EM | Admit: 2017-02-19 | Discharge: 2017-02-19 | Disposition: A | Discharge status: Home / Self Care | Payer: No Typology Code available for payment source | Attending: Emergency Medicine | Admitting: Emergency Medicine

## 2017-02-19 DIAGNOSIS — R072 Precordial pain: Secondary | ICD-10-CM

## 2017-02-19 DIAGNOSIS — Z79899 Other long term (current) drug therapy: Secondary | ICD-10-CM | POA: Insufficient documentation

## 2017-02-19 LAB — CBC
HCT: 31.7 % — ABNORMAL LOW (ref 36.0–46.0)
Hemoglobin: 9.7 g/dL — ABNORMAL LOW (ref 12.0–15.0)
MCH: 23.2 pg — ABNORMAL LOW (ref 26.0–34.0)
MCHC: 30.6 g/dL (ref 30.0–36.0)
MCV: 75.7 fL — ABNORMAL LOW (ref 78.0–100.0)
Platelets: 301 10*3/uL (ref 150–400)
RBC: 4.19 MIL/uL (ref 3.87–5.11)
RDW: 15 % (ref 11.5–15.5)
WBC: 5.8 10*3/uL (ref 4.0–10.5)

## 2017-02-19 LAB — BASIC METABOLIC PANEL
Anion gap: 6 (ref 5–15)
BUN: 5 mg/dL — ABNORMAL LOW (ref 6–20)
CO2: 27 mmol/L (ref 22–32)
Calcium: 8.7 mg/dL — ABNORMAL LOW (ref 8.9–10.3)
Chloride: 106 mmol/L (ref 101–111)
Creatinine, Ser: 0.76 mg/dL (ref 0.44–1.00)
GFR calc Af Amer: >60 mL/min (ref >60)
GFR calc non Af Amer: >60 mL/min (ref >60)
Glucose, Bld: 92 mg/dL (ref 65–99)
Potassium: 3.7 mmol/L (ref 3.5–5.1)
Sodium: 139 mmol/L (ref 135–145)

## 2017-02-19 LAB — HEPATIC FUNCTION PANEL
ALT: 16 U/L (ref 14–54)
AST: 22 U/L (ref 15–41)
Albumin: 3.6 g/dL (ref 3.5–5.0)
Alkaline Phosphatase: 58 U/L (ref 38–126)
Bilirubin, Direct: 0.1 mg/dL (ref 0.1–0.5)
Indirect Bilirubin: 0.3 mg/dL (ref 0.3–0.9)
Total Bilirubin: 0.4 mg/dL (ref 0.3–1.2)
Total Protein: 6.8 g/dL (ref 6.5–8.1)

## 2017-02-19 LAB — I-STAT TROPONIN, ED: Troponin i, poc: 0 ng/mL (ref 0.00–0.08)

## 2017-02-19 LAB — D-DIMER, QUANTITATIVE: D-Dimer, Quant: 0.67 ug/mL-FEU — ABNORMAL HIGH (ref 0.00–0.50)

## 2017-02-19 LAB — LIPASE, BLOOD: Lipase: 24 U/L (ref 11–51)

## 2017-02-19 MED ORDER — IOPAMIDOL (ISOVUE-370) INJECTION 76%
INTRAVENOUS | Status: AC
  Administered 2017-02-19: 100 mL
  Filled 2017-02-19: qty 100

## 2017-02-19 MED ORDER — ZOLPIDEM TARTRATE 5 MG PO TABS: 5.0000 mg | ORAL_TABLET | Freq: Every evening | ORAL | 0 refills | Status: DC | PRN

## 2017-02-19 MED FILL — ESOMEPRAZOLE MAGNESIUM 40 M: 40 | 30 days supply | Qty: 30 | Fill #0

## 2017-02-19 MED FILL — DOXYCYCLINE 100 MG TABLET: 100 | 10 days supply | Qty: 20 | Fill #0

## 2017-02-19 NOTE — ED Notes (Signed)
IV attempted without success. 

## 2017-02-19 NOTE — ED Notes (Signed)
Pt expressed interest in leaving before scan. MD in room to talk with her.

## 2017-02-19 NOTE — ED Notes (Signed)
Pt states she needs to leave "like I need to leave  Now.  I got things to do."  Pt reports she has ENDO scheduled tomorrow.  She is just waiting to get the result of the CT angio.  [facility] EDP was able to provide this to pt.  Pt left without her d/c instructions and is instructed to return next time to pick them up if she cannot wait.  Pt verbalizes she can just come back later to pick them up.  Pt is A&O x 4.

## 2017-02-19 NOTE — ED Notes (Signed)
ED Provider at bedside. 

## 2017-02-19 NOTE — ED Notes (Signed)
Patient transported to CT 

## 2017-02-19 NOTE — ED Triage Notes (Signed)
Pt reports recently  Checking bp at home and it was elevated. Reports posterior headache and then onset yesterday of chest pains that increase when taking a deep breath. Denies recent cough. No resp distress is noted at triage.

## 2017-02-19 NOTE — Progress Notes (Signed)
Patient is coming into pick-up prescription for Ambien as she is taking to a different pharmacy that will fill the prescription cheaper.  [name] S. Tiburcio PeaHarris, MSN, FNP-C [facility]  [address] [place], [place] [address] [phone]

## 2017-02-19 NOTE — ED Provider Notes (Signed)
[facility] Provider Note   CSN: [id number] Arrival date & time: 02/19/17  1349     History   Chief Complaint Chief Complaint  Patient presents with  . Chest Pain    HPI [name] is a 41 y.o. female.  The history is provided by the patient and medical records.  Chest Pain   This is a recurrent problem. The current episode started more than 2 days ago. The problem occurs constantly. The problem has been gradually improving. The pain is associated with breathing. The pain is present in the lateral region and substernal region. The pain is at a severity of 6/10. The pain is moderate. The quality of the pain is described as pleuritic and sharp. The pain does not radiate. The symptoms are aggravated by deep breathing and certain positions. Pertinent negatives include no abdominal pain, no back pain, no cough, no diaphoresis, no dizziness, no exertional chest pressure, no fever, no headaches, no malaise/fatigue, no numbness, no palpitations, no shortness of breath, no syncope and no vomiting. She has tried nothing for the symptoms. The treatment provided no relief.  Pertinent negatives for past medical history include no CAD.    Past Medical History:  Diagnosis Date  . Allergy   . Anemia   . Anxiety   . Arthritis   . Constipation   . GERD (gastroesophageal reflux disease)   . Hx of endometriosis   . Macromastia 07/2011  . Migraine     Patient Active Problem List   Diagnosis Date Noted  . ANA positive 12/30/2016  . Gastroesophageal reflux disease 06/27/2016  . Disturbance of skin sensation 04/27/2014  . Dizziness and giddiness 03/14/2014  . Headache, migraine 03/14/2014  . Myalgia and myositis, unspecified 06/07/2013  . Nausea and vomiting in adult 08/22/2012  . Viral syndrome 08/22/2012  . Pelvic pain in female 12/18/2010  . PID (acute pelvic inflammatory disease) 12/18/2010  . ANEMIA-IRON DEFICIENCY 10/15/2006  . HERPES,  GENITAL NOS 09/30/2006  . OBESITY NOS 09/30/2006  . DISORDER, NONORGANIC SLEEP NOS 09/30/2006  . CONSTIPATION NOS 09/30/2006  . NEURITIS, LUMBOSACRAL NOS 09/30/2006  . HX, PERSONAL, MENTAL DISORDER NOS 09/30/2006    Past Surgical History:  Procedure Laterality Date  . ABDOMINAL HYSTERECTOMY  12/12/2003  . ADENOIDECTOMY  05/2011  . ANKLE ARTHROSCOPY  12/29/2007   right; with extensive debridement  . BLADDER NECK RECONSTRUCTION  01/14/2011   Procedure: BLADDER NECK REPAIR;  Surgeon: Reva Boresanya S Pratt, MD;  Location: [facility];  Service: Gynecology;  Laterality: N/A;  Laparoscopic Repair of Incidental Cystotomy  . BREAST REDUCTION SURGERY  08/05/2011   Procedure: MAMMARY REDUCTION  (BREAST);  Surgeon: Louisa SecondGerald Truesdale, MD;  Location: [facility];  Service: Plastics;  Laterality: Bilateral;  bilateral  . CESAREAN SECTION  12/29/2000; 1994  . DILATION AND CURETTAGE OF UTERUS  07/08/2003   open laparoscopy  . LAPAROSCOPIC SALPINGOOPHERECTOMY  01/14/2011   left  . REPAIR PERONEAL TENDONS ANKLE  01/03/2010   repair right subluxing peroneal tendons  . RIGHT OOPHORECTOMY     with lysis of adhesions  . rt. shoulder surgery    . SALPINGECTOMY  12/12/2003   right; with TAH  . TONSILLECTOMY  as a child  . TUBAL LIGATION  12/29/2000    OB History    Gravida Para Term Preterm AB Living   2 2 2     2    SAB TAB Ectopic Multiple Live Births  Home Medications    Prior to Admission medications   Medication Sig Start Date End Date Taking? Authorizing Provider  acetaminophen (TYLENOL) 500 MG tablet Take 1 tablet (500 mg total) by mouth every 6 (six) hours as needed for mild pain (pain). 07/24/16   Massie Maroon, FNP  albuterol (PROVENTIL HFA;VENTOLIN HFA) 108 (90 Base) MCG/ACT inhaler Inhale 1-2 puffs into the lungs every 6 (six) hours as needed for wheezing or shortness of breath (cough). 12/05/16   Bing Neighbors, FNP  cetirizine (ZYRTEC) 10 MG tablet Take 1 tablet (10 mg  total) by mouth daily. 07/24/16   Massie Maroon, FNP  esomeprazole (NEXIUM) 40 MG capsule TAKE 1 CAPSULE BY MOUTH EVERY MORNING 02/05/17   Bing Neighbors, FNP  ferrous sulfate 325 (65 FE) MG EC tablet Take 1 tablet (325 mg total) by mouth 2 (two) times daily with a meal. 02/05/17   Armbruster, Willaim Rayas, MD  ferrous sulfate 325 (65 FE) MG tablet Take 325 mg by mouth 2 (two) times daily with a meal. 02/14/17   [provider]  ibuprofen (ADVIL,MOTRIN) 600 MG tablet Take 1 tablet (600 mg total) by mouth every 6 (six) hours as needed. 11/09/16   Fayrene Helper, PA-C  triamcinolone cream (KENALOG) 0.1 % Apply 1 application topically 2 (two) times daily. 12/30/16   Bing Neighbors, FNP  zolpidem (AMBIEN) 5 MG tablet Take 1 tablet (5 mg total) by mouth at bedtime as needed. for sleep 02/19/17   Bing Neighbors, FNP    Family History Family History  Problem Relation Age of Onset  . Diabetes Mother   . Hypertension Mother   . Thyroid disease Sister   . Heart attack Maternal Grandmother   . Stroke Maternal Grandfather   . Colon cancer Neg Hx   . Colon polyps Neg Hx   . Esophageal cancer Neg Hx   . Rectal cancer Neg Hx   . Stomach cancer Neg Hx     Social History Social History  Substance Use Topics  . Smoking status: Never Smoker  . Smokeless tobacco: Never Used  . Alcohol use No     Allergies   Patient has no known allergies.   Review of Systems Review of Systems  Constitutional: Negative for chills, diaphoresis, fatigue, fever and malaise/fatigue.  HENT: Negative for congestion.   Eyes: Negative for visual disturbance.  Respiratory: Negative for cough, chest tightness, shortness of breath, wheezing and stridor.   Cardiovascular: Positive for chest pain. Negative for palpitations, leg swelling and syncope.  Gastrointestinal: Negative for abdominal pain, constipation, diarrhea and vomiting.  Genitourinary: Negative for dysuria and flank pain.  Musculoskeletal:  Negative for back pain, neck pain and neck stiffness.  Skin: Negative for rash and wound.  Neurological: Negative for dizziness, light-headedness, numbness and headaches.  Psychiatric/Behavioral: Negative for agitation.     Physical Exam Updated Vital Signs BP (!) 133/93 (BP Location: Right Arm)   Pulse 62   Temp 97.6 F (36.4 C) (Temporal)   Resp 14   LMP 12/27/2003   SpO2 100%   Physical Exam  Constitutional: She appears well-developed and well-nourished. No distress.  HENT:  Head: Normocephalic and atraumatic.  Mouth/Throat: Oropharynx is clear and moist.  Eyes: Conjunctivae are normal.  Neck: Neck supple.  Cardiovascular: Normal rate, regular rhythm and intact distal pulses.   No murmur heard. Pulmonary/Chest: Effort normal and breath sounds normal. No respiratory distress. She has no wheezes. She exhibits tenderness.    Abdominal: Soft. There  is no tenderness.  Musculoskeletal: She exhibits no edema or tenderness.  Neurological: She is alert. No sensory deficit. She exhibits normal muscle tone.  Skin: Skin is warm and dry. She is not diaphoretic.  Psychiatric: She has a normal mood and affect.  Nursing note and vitals reviewed.    ED Treatments / Results  Labs (all labs ordered are listed, but only abnormal results are displayed) Labs Reviewed  BASIC METABOLIC PANEL - Abnormal; Notable for the following:       Result Value   BUN 5 (*)    Calcium 8.7 (*)    All other components within normal limits  CBC - Abnormal; Notable for the following:    Hemoglobin 9.7 (*)    HCT 31.7 (*)    MCV 75.7 (*)    MCH 23.2 (*)    All other components within normal limits  D-DIMER, QUANTITATIVE (NOT AT [facility]) - Abnormal; Notable for the following:    D-Dimer, Quant 0.67 (*)    All other components within normal limits  HEPATIC FUNCTION PANEL  LIPASE, BLOOD  I-STAT TROPONIN, ED    EKG  EKG Interpretation  Date/Time:  Wednesday February 19 2017 13:46:37 EDT Ventricular  Rate:  67 PR Interval:  150 QRS Duration: 92 QT Interval:  410 QTC Calculation: 433 R Axis:   84 Text Interpretation:  Normal sinus rhythm Normal ECG When compared to september ECG, New T wave inversion in lead 3. Unchanged from ECG several days ago.  No STEMI Confirmed by Theda Belfast ([id number]) on 02/19/2017 3:58:45 PM       Radiology Dg Chest 2 View  Result Date: 02/19/2017 CLINICAL DATA:  41 year old female with hypertension. Fatigue, Posterior headache, chest pain, pleuritic pain. EXAM: CHEST  2 VIEW COMPARISON:  02/08/2017 and earlier. FINDINGS: Seated upright AP and lateral views of the chest. Mediastinal contours are stable and within normal limits. Visualized tracheal air column is within normal limits. Normal lung volumes. The lungs remain clear. No pneumothorax or pleural effusion. No acute osseous abnormality identified. Negative visible bowel gas pattern. IMPRESSION: Negative.  No acute cardiopulmonary abnormality. Electronically Signed   By: Odessa Fleming M.D.   On: 02/19/2017 14:19   Ct Angio Chest Pe W And/or Wo Contrast  Result Date: 02/19/2017 CLINICAL DATA:  Chest pain and positive D-dimer EXAM: CT ANGIOGRAPHY CHEST WITH CONTRAST TECHNIQUE: Multidetector CT imaging of the chest was performed using the standard protocol during bolus administration of intravenous contrast. Multiplanar CT image reconstructions and MIPs were obtained to evaluate the vascular anatomy. CONTRAST:  65 mL Isovue 370. COMPARISON:  None. FINDINGS: Cardiovascular: Mild atherosclerotic changes of the aorta are noted. No aneurysmal dilatation is seen. No cardiac enlargement is noted. No significant coronary calcifications are seen. The pulmonary artery shows a normal branching pattern. No filling defect to suggest pulmonary embolism is identified. Mediastinum/Nodes: The thoracic inlet is within normal limits. The esophagus is unremarkable. No significant hilar or mediastinal adenopathy is noted. Lungs/Pleura: Lungs  are well aerated bilaterally. No focal infiltrate or sizable effusion is seen. Upper Abdomen: Within normal limits. Musculoskeletal: No acute bony abnormality is seen. Review of the MIP images confirms the above findings. IMPRESSION: No evidence of pulmonary emboli.  No acute abnormality is seen. Electronically Signed   By: Alcide Clever M.D.   On: 02/19/2017 19:42    Procedures Procedures (including critical care time)  Medications Ordered in ED Medications  iopamidol (ISOVUE-370) 76 % injection (100 mLs  Contrast Given 02/19/17 1756)  Initial Impression / Assessment and Plan / ED Course  I have reviewed the triage vital signs and the nursing notes.  Pertinent labs & imaging results that were available during my care of the patient were reviewed by me and considered in my medical decision making (see chart for details).      Camay Pedigo Stanek is a 41 y.o. female with a past medical history significant for GERD and migraines who presents with 3 days of right-sided chest pain. She describes the pain as dull and sharp. She is described as a 6 out of 10 severity and is pleuritic in quality. She says it is worse when she takes deep breaths or coughs. She denies significant fevers or chills. She reports intermittent episodes of constipation and diarrhea. She reports that she is currently being worked up for abdominal problems and is scheduled to have an upcoming EGD and colonoscopy. Patient denies any urinary symptoms. She denies nausea, vomiting, or severe abdominal pain. She denies any diaphoresis, syncopal episodes, or palpitations. She has had these symptoms in the past and says she has increase in stress.  On exam, patient has mild tenderness in the right chest.Lungs are clear to auscultation. Abdomen is minimally tender in the epigastric area. No CVA tenderness. No significant lower extremity edema. Normal pulses in all extremity. No focal neurologic deficits.  Based on patient's symptoms of  her chest pain, d-dimer was added to screening chest pain laboratory testing. Initial EKG appeared similar to the last tracing however, when compared to one from 2 months ago, there was a new T-wave inversion in lead 3. Lipase not elevated. Troponin negative initially. Hepatic function unremarkable. CBC showed no leukocytosis. Patient has known anemia and reports that she has not been taking her iron pills. D-dimer was elevated. CT PE study was ordered. Chest x-ray was unremarkable.  Patient's heart score is a 2.  The patient has negative troponin 2 and PE study is reassuring, suspect patient will be stable for discharge home.   PE study negative for PE.n No abnormality seen.   PT reassured about workup and instructed to follow up with PCP. PT understood return precautions.   Pt discharged in good condition.     Final Clinical Impressions(s) / ED Diagnoses   Final diagnoses:  Precordial pain    New Prescriptions Discharge Medication List as of 02/19/2017  8:31 PM      Clinical Impression: 1. Precordial pain     Disposition: Discharge  Condition: Good  I have discussed the results, Dx and Tx plan with the pt(& family if present). He/she/they expressed understanding and agree(s) with the plan. Discharge instructions discussed at great length. Strict return precautions discussed and pt &/or family have verbalized understanding of the instructions. No further questions at time of discharge.    Follow Up: Bing Neighbors, FNP [address] [place] [address] [phone]  Schedule an appointment as soon as possible for a visit    [facility] EMERGENCY DEPARTMENT [address] [id number] mc [place] [address] [phone]  If symptoms worsen     Lashaundra Lehrmann, Canary Brim, MD 02/20/17 1158

## 2017-02-19 NOTE — ED Notes (Signed)
Pt to CT at this time.

## 2017-02-20 ENCOUNTER — Ambulatory Visit (AMBULATORY_SURGERY_CENTER): Payer: Self-pay | Admitting: Gastroenterology

## 2017-02-20 ENCOUNTER — Encounter: Payer: Self-pay | Admitting: Gastroenterology

## 2017-02-20 VITALS — BP 133/91 | HR 75 | Temp 97.8°F | Resp 13 | Ht 64.0 in | Wt 215.4 lb

## 2017-02-20 DIAGNOSIS — R070 Pain in throat: Secondary | ICD-10-CM

## 2017-02-20 DIAGNOSIS — D509 Iron deficiency anemia, unspecified: Secondary | ICD-10-CM

## 2017-02-20 DIAGNOSIS — K629 Disease of anus and rectum, unspecified: Secondary | ICD-10-CM

## 2017-02-20 DIAGNOSIS — K649 Unspecified hemorrhoids: Secondary | ICD-10-CM

## 2017-02-20 DIAGNOSIS — K3189 Other diseases of stomach and duodenum: Secondary | ICD-10-CM

## 2017-02-20 MED ORDER — SODIUM CHLORIDE 0.9 % IV SOLN: 500.0000 mL | INTRAVENOUS | Status: DC

## 2017-02-20 NOTE — Op Note (Signed)
[facility] Patient Name: [name] Procedure Date: 02/20/2017 1:49 PM MRN: [id number] Endoscopist: Viviann Spare P. Armbruster MD, MD Age: 41 Referring MD:  Date of Birth: [birth year] Gender: Female Account #: [id number] Procedure:                Upper GI endoscopy Indications:              Iron deficiency anemia, history of throat pain                            (thought to be due to tonsillitis per ENT?) Medicines:                Monitored Anesthesia Care Procedure:                Pre-Anesthesia Assessment:                           - Prior to the procedure, a History and Physical                            was performed, and patient medications and                            allergies were reviewed. The patient's tolerance of                            previous anesthesia was also reviewed. The risks                            and benefits of the procedure and the sedation                            options and risks were discussed with the patient.                            All questions were answered, and informed consent                            was obtained. Prior Anticoagulants: The patient has                            taken no previous anticoagulant or antiplatelet                            agents. ASA Grade Assessment: II - A patient with                            mild systemic disease. After reviewing the risks                            and benefits, the patient was deemed in                            satisfactory condition to undergo the procedure.  After obtaining informed consent, the endoscope was                            passed under direct vision. Throughout the                            procedure, the patient's blood pressure, pulse, and                            oxygen saturations were monitored continuously. The                            Model GIF-HQ190 [id number](SN#[id number]) scope was introduced   through the mouth, and advanced to the second part                            of duodenum. The upper GI endoscopy was                            accomplished without difficulty. The patient                            tolerated the procedure well. Scope In: Scope Out: Findings:                 Esophagogastric landmarks were identified: the                            Z-line was found at 36 cm, the gastroesophageal                            junction was found at 36 cm and the upper extent of                            the gastric folds was found at 38 cm from the                            incisors.                           A 2 cm hiatal hernia was present.                           The exam of the esophagus was otherwise normal.                           The entire examined stomach was normal. Biopsies                            were taken with a cold forceps from the antrum,                            body, and incisura for H pylori testing given iron  deficiency.                           The duodenal bulb and second portion of the                            duodenum were normal. Complications:            No immediate complications. Estimated blood loss:                            Minimal. Estimated Blood Loss:     Estimated blood loss was minimal. Impression:               - Esophagogastric landmarks identified.                           - 2 cm hiatal hernia.                           - Normal esophagus.                           - Normal stomach. Biopsied to rule out iron                            deficiency given history of iron deficiency.                           - Normal duodenal bulb and second portion of the                            duodenum. Recommendation:           - Patient has a contact number available for                            emergencies. The signs and symptoms of potential                            delayed complications were  discussed with the                            patient. Return to normal activities tomorrow.                            Written discharge instructions were provided to the                            patient.                           - Resume previous diet.                           - Continue present medications.                           -  Await pathology results with further                            recommendations. Viviann Spare P. Armbruster MD, MD 02/20/2017 2:39:55 PM This report has been signed electronically.

## 2017-02-20 NOTE — Progress Notes (Signed)
Called to room to assist during endoscopic procedure.  Patient ID and intended procedure confirmed with present staff. Received instructions for my participation in the procedure from the performing physician.  

## 2017-02-20 NOTE — Op Note (Signed)
[facility] Patient Name: Sheila AlexandersVickie [name] Procedure Date: 02/20/2017 1:48 PM MRN: [id number] Endoscopist: Viviann SpareSteven P. Timarion Agcaoili MD, MD Age: [age] Referring MD:  Date of Birth: 1975/11/19 Gender: Female Account #: [id number] Procedure:                Colonoscopy Indications:              Iron deficiency anemia Medicines:                Monitored Anesthesia Care Procedure:                Pre-Anesthesia Assessment:                           - Prior to the procedure, a History and Physical                            was performed, and patient medications and                            allergies were reviewed. The patient's tolerance of                            previous anesthesia was also reviewed. The risks                            and benefits of the procedure and the sedation                            options and risks were discussed with the patient.                            All questions were answered, and informed consent                            was obtained. Prior Anticoagulants: The patient has                            taken no previous anticoagulant or antiplatelet                            agents. ASA Grade Assessment: II - A patient with                            mild systemic disease. After reviewing the risks                            and benefits, the patient was deemed in                            satisfactory condition to undergo the procedure.                           After obtaining informed consent, the colonoscope  was passed under direct vision. Throughout the                            procedure, the patient's blood pressure, pulse, and                            oxygen saturations were monitored continuously. The                            Colonoscope was introduced through the anus and                            advanced to the the terminal ileum, with                            identification of the appendiceal  orifice and IC                            valve. The colonoscopy was performed without                            difficulty. The patient tolerated the procedure                            well. The quality of the bowel preparation was                            good. The terminal ileum, ileocecal valve,                            appendiceal orifice, and rectum were photographed. Scope In: 2:17:29 PM Scope Out: 2:32:14 PM Scope Withdrawal Time: 0 hours 10 minutes 58 seconds  Total Procedure Duration: 0 hours 14 minutes 45 seconds  Findings:                 The perianal and digital rectal examinations were                            normal.                           The terminal ileum appeared normal.                           A diffuse area of moderately superficially                            erythematous mucosa was found in the distal rectum,                            without focal ulceration or erosion. Biopsies were                            taken with a cold forceps for histology.  Internal hemorrhoids were found during                            retroflexion. The hemorrhoids were small.                           The exam was otherwise without abnormality. Complications:            No immediate complications. Estimated blood loss:                            Minimal. Estimated Blood Loss:     Estimated blood loss was minimal. Impression:               - The examined portion of the ileum was normal.                           - Erythematous mucosa in the distal rectum. This                            appears superficial, perhaps related to bowel prep.                            Biopsied to rule out colitis.                           - Internal hemorrhoids.                           - The examination was otherwise normal. Recommendation:           - Patient has a contact number available for                            emergencies. The signs and symptoms of  potential                            delayed complications were discussed with the                            patient. Return to normal activities tomorrow.                            Written discharge instructions were provided to the                            patient.                           - Resume previous diet.                           - Continue present medications.                           - Await pathology results.                           -  Repeat colonoscopy in 10 years for screening                            purposes. Viviann Spare P. Kj Imbert MD, MD 02/20/2017 2:36:29 PM This report has been signed electronically.

## 2017-02-20 NOTE — Progress Notes (Signed)
Pt's states no medical or surgical changes since previsit or office visit. 

## 2017-02-20 NOTE — Progress Notes (Signed)
To recovery, report to RN, VSS. 

## 2017-02-20 NOTE — Patient Instructions (Signed)
YOU HAD AN ENDOSCOPIC PROCEDURE TODAY AT THE [facility]:   Refer to the procedure report that was given to you for any specific questions about what was found during the examination.  If the procedure report does not answer your questions, please call your gastroenterologist to clarify.  If you requested that your care partner not be given the details of your procedure findings, then the procedure report has been included in a sealed envelope for you to review at your convenience later.  YOU SHOULD EXPECT: Some feelings of bloating in the abdomen. Passage of more gas than usual.  Walking can help get rid of the air that was put into your GI tract during the procedure and reduce the bloating. If you had a lower endoscopy (such as a colonoscopy or flexible sigmoidoscopy) you may notice spotting of blood in your stool or on the toilet paper. If you underwent a bowel prep for your procedure, you may not have a normal bowel movement for a few days.  Please Note:  You might notice some irritation and congestion in your nose or some drainage.  This is from the oxygen used during your procedure.  There is no need for concern and it should clear up in a day or so.  SYMPTOMS TO REPORT IMMEDIATELY:   Following lower endoscopy (colonoscopy or flexible sigmoidoscopy):  Excessive amounts of blood in the stool  Significant tenderness or worsening of abdominal pains  Swelling of the abdomen that is new, acute  Fever of 100F or higher   Following upper endoscopy (EGD)  Vomiting of blood or coffee ground material  New chest pain or pain under the shoulder blades  Painful or persistently difficult swallowing  New shortness of breath  Fever of 100F or higher  Black, tarry-looking stools  For urgent or emergent issues, a gastroenterologist can be reached at any hour by calling [phone].   DIET:  We do recommend a small meal at first, but then you may proceed to your regular diet.  Drink  plenty of fluids but you should avoid alcoholic beverages for 24 hours.  ACTIVITY:  You should plan to take it easy for the rest of today and you should NOT DRIVE or use heavy machinery until tomorrow (because of the sedation medicines used during the test).    FOLLOW UP: Our staff will call the number listed on your records the next business day following your procedure to check on you and address any questions or concerns that you may have regarding the information given to you following your procedure. If we do not reach you, we will leave a message.  However, if you are feeling well and you are not experiencing any problems, there is no need to return our call.  We will assume that you have returned to your regular daily activities without incident.  If any biopsies were taken you will be contacted by phone or by letter within the next 1-3 weeks.  Please call us at [phone] if you have not heard about the biopsies in 3 weeks.    SIGNATURES/CONFIDENTIALITY: You and/or your care partner have signed paperwork which will be entered into your electronic medical record.  These signatures attest to the fact that that the information above on your After Visit Summary has been reviewed and is understood.  Full responsibility of the confidentiality of this discharge information lies with you and/or your care-partner.  Await biopsy results.  Recall colonoscopy 10 years-2028.  Hiatal hernia  information given.  Await biopsy results.

## 2017-02-21 ENCOUNTER — Telehealth: Payer: Self-pay

## 2017-02-21 NOTE — Telephone Encounter (Signed)
  Follow up Call-  Call back number 02/20/2017  Post procedure Call Back phone  # [phone]  Permission to leave phone message Yes  Some recent data might be hidden     Patient questions:  Do you have a fever, pain , or abdominal swelling? No. Pain Score  0 *  Have you tolerated food without any problems? Yes.    Have you been able to return to your normal activities? Yes.    Do you have any questions about your discharge instructions: Diet   No. Medications  No. Follow up visit  No.  Do you have questions or concerns about your Care? No.  Actions: * If pain score is 4 or above: No action needed, pain <4.

## 2017-02-25 ENCOUNTER — Telehealth: Payer: Self-pay | Admitting: Gastroenterology

## 2017-02-25 NOTE — Telephone Encounter (Signed)
Patient returning Julie's call about path results from procedure on 02/20/17.

## 2017-02-26 NOTE — Telephone Encounter (Signed)
Left message for patient to call back  

## 2017-02-26 NOTE — Telephone Encounter (Signed)
Patient again returning call. Patient said best time to call today around 1:30pm

## 2017-02-28 ENCOUNTER — Telehealth: Payer: Self-pay | Admitting: Family Medicine

## 2017-02-28 MED FILL — CEFUROXIME AXETIL 250 MG TA: 250 | 10 days supply | Qty: 20 | Fill #0

## 2017-02-28 NOTE — Telephone Encounter (Signed)
Pt came in, wanting to disclose private information please call back as soon as possible. thanks.

## 2017-03-02 NOTE — Progress Notes (Deleted)
Cardiology Office Note   Date:  03/02/2017   ID:  [name], DOB Oct 03, 1975, MRN [id number]  PCP:  Bing Neighbors, FNP  Cardiologist:   Chilton Si, MD   No chief complaint on file.     History of Present Illness: [name] is a 41 y.o. female who is being seen today for follow up.  She was initially seen 01/2017 for the evaluation of chest pain.  She has been seen in the emergency department several times with various complaints including chest pain, arm numbness, trouble swallowing, hives, hair loss, dysphagia, right hand and arm numbness and tingling.  Cardiac enzymes have been negative. EKGs have been negative for ischemia and showed sinus rhythm and tachycardia.  She was referred for ETT 01/24/17 that was negative for ischemia.  She achieved 11 METS on a Bruce protocol.  She also reported lower extremity edema, but none was noted on exam.  Since that appointment she was seen in the ED 02/19/17 with chest pain.  Cardiac enzymes were negative and chest CT-A was negative for PE.  No coronary calcification was noted.       Past Medical History:  Diagnosis Date  . Allergy   . Anemia   . Anxiety   . Arthritis   . Constipation   . GERD (gastroesophageal reflux disease)   . Hx of endometriosis   . Macromastia 07/2011  . Migraine     Past Surgical History:  Procedure Laterality Date  . ABDOMINAL HYSTERECTOMY  12/12/2003  . ADENOIDECTOMY  05/2011  . ANKLE ARTHROSCOPY  12/29/2007   right; with extensive debridement  . BLADDER NECK RECONSTRUCTION  01/14/2011   Procedure: BLADDER NECK REPAIR;  Surgeon: Reva Bores, MD;  Location: [facility];  Service: Gynecology;  Laterality: N/A;  Laparoscopic Repair of Incidental Cystotomy  . BREAST REDUCTION SURGERY  08/05/2011   Procedure: MAMMARY REDUCTION  (BREAST);  Surgeon: Louisa Second, MD;  Location: [facility];  Service: Plastics;  Laterality: Bilateral;  bilateral  . CESAREAN SECTION  12/29/2000; 1994    . DILATION AND CURETTAGE OF UTERUS  07/08/2003   open laparoscopy  . LAPAROSCOPIC SALPINGOOPHERECTOMY  01/14/2011   left  . REPAIR PERONEAL TENDONS ANKLE  01/03/2010   repair right subluxing peroneal tendons  . RIGHT OOPHORECTOMY     with lysis of adhesions  . rt. shoulder surgery    . SALPINGECTOMY  12/12/2003   right; with TAH  . TONSILLECTOMY  as a child  . TUBAL LIGATION  12/29/2000     Current Outpatient Prescriptions  Medication Sig Dispense Refill  . acetaminophen (TYLENOL) 500 MG tablet Take 1 tablet (500 mg total) by mouth every 6 (six) hours as needed for mild pain (pain). (Patient taking differently: Take 500 mg by mouth every 6 (six) hours as needed (for pain). ) 30 tablet 0  . albuterol (PROVENTIL HFA;VENTOLIN HFA) 108 (90 Base) MCG/ACT inhaler Inhale 1-2 puffs into the lungs every 6 (six) hours as needed for wheezing or shortness of breath (cough). (Patient taking differently: Inhale 1-2 puffs into the lungs every 6 (six) hours as needed (cough, shortness of breath, or wheezing). ) 1 Inhaler 2  . cetirizine (ZYRTEC) 10 MG tablet Take 1 tablet (10 mg total) by mouth daily. (Patient not taking: Reported on 02/20/2017) 30 tablet 11  . esomeprazole (NEXIUM) 40 MG capsule TAKE 1 CAPSULE BY MOUTH EVERY MORNING (Patient taking differently: Take 40 mg by mouth in the morning) 90 capsule  1  . ferrous sulfate 325 (65 FE) MG EC tablet Take 1 tablet (325 mg total) by mouth 2 (two) times daily with a meal. 60 tablet 3  . ibuprofen (ADVIL,MOTRIN) 600 MG tablet Take 1 tablet (600 mg total) by mouth every 6 (six) hours as needed. (Patient not taking: Reported on 02/19/2017) 30 tablet 0  . triamcinolone cream (KENALOG) 0.1 % Apply 1 application topically 2 (two) times daily. (Patient taking differently: Apply 1 application topically 2 (two) times daily as needed (for irritation on face). ) 30 g 0  . zolpidem (AMBIEN) 5 MG tablet Take 1 tablet (5 mg total) by mouth at bedtime as needed. for sleep  (Patient taking differently: Take 5 mg by mouth at bedtime as needed for sleep. ) 30 tablet 0   No current facility-administered medications for this visit.     Allergies:   Patient has no known allergies.    Social History:  The patient  reports that she has never smoked. She has never used smokeless tobacco. She reports that she does not drink alcohol or use drugs.   Family History:  The patient's family history includes Diabetes in her mother; Heart attack in her maternal grandmother; Hypertension in her mother; Stroke in her maternal grandfather; Thyroid disease in her sister.    ROS:  Please see the history of present illness.   Otherwise, review of systems are positive for feet cramping.   All other systems are reviewed and negative.    PHYSICAL EXAM: VS:  LMP 12/27/2003  , BMI There is no height or weight on file to calculate BMI. GENERAL:  Well appearing HEENT:  Pupils equal round and reactive, fundi not visualized, oral mucosa unremarkable NECK:  No jugular venous distention, waveform within normal limits, carotid upstroke brisk and symmetric, no bruits, no thyromegaly LUNGS:  Clear to auscultation bilaterally HEART:  RRR.  PMI not displaced or sustained,S1 and S2 within normal limits, no S3, no S4, no clicks, no rubs, no murmurs ABD:  Flat, positive bowel sounds normal in frequency in pitch, no bruits, no rebound, no guarding, no midline pulsatile mass, no hepatomegaly, no splenomegaly EXT:  2 plus pulses throughout, no edema, no cyanosis no clubbing SKIN:  No rashes no nodules NEURO:  Cranial nerves II through XII grossly intact, motor grossly intact throughout PSYCH:  Cognitively intact, oriented to person place and time   EKG:  EKG is ordered today. The ekg ordered today demonstrates sinus rhythm. Rate 70 bpm.  ETT 01/24/17:   Blood pressure demonstrated a normal response to exercise.  There was no ST segment deviation noted during stress.   1. Good exercise  tolerance.  2. No evidence for ischemia by ST segment analysis.     Recent Labs: 12/09/2016: TSH 3.821 02/19/2017: ALT 16; BUN 5; Creatinine, Ser 0.76; Hemoglobin 9.7; Platelets 301; Potassium 3.7; Sodium 139    Lipid Panel    Component Value Date/Time   CHOL 177 12/06/2015 1153   TRIG 64 12/06/2015 1153   HDL 55 12/06/2015 1153   CHOLHDL 3.2 12/06/2015 1153   VLDL 13 12/06/2015 1153   LDLCALC 109 12/06/2015 1153      Wt Readings from Last 3 Encounters:  02/20/17 97.7 kg (215 lb 6.4 oz)  02/13/17 97.3 kg (214 lb 8 oz)  02/04/17 97.1 kg (214 lb)     ASSESSMENT AND PLAN:  # Chest pain:  Ms. Sinning's symptoms are concerning for ischemia.  We will get an ETT to assess for  CAD.    # LE edema: Not present on exam today.  Recommended limiting salt intake, walking more at work and compression stockings.    Current medicines are reviewed at length with the patient today.  The patient does not have concerns regarding medicines.  The following changes have been made:  no change  Labs/ tests ordered today include:   No orders of the defined types were placed in this encounter.    Disposition:   FU with Cherina Dhillon C. Duke Salviaandolph, MD, [facility] in 1 month.    This note was written with the assistance of speech recognition software.  Please excuse any transcriptional errors.  Signed, Kadien Lineman C. Duke Salviaandolph, MD, [facility]  03/02/2017 10:52 PM    [facility]

## 2017-03-06 ENCOUNTER — Ambulatory Visit (INDEPENDENT_AMBULATORY_CARE_PROVIDER_SITE_OTHER): Payer: No Typology Code available for payment source | Admitting: Cardiovascular Disease

## 2017-03-06 ENCOUNTER — Ambulatory Visit: Payer: Self-pay | Admitting: Cardiovascular Disease

## 2017-03-06 ENCOUNTER — Encounter: Payer: Self-pay | Admitting: Cardiovascular Disease

## 2017-03-06 VITALS — BP 122/78 | HR 85 | Ht 64.0 in | Wt 215.6 lb

## 2017-03-06 DIAGNOSIS — R0602 Shortness of breath: Secondary | ICD-10-CM

## 2017-03-06 DIAGNOSIS — R0789 Other chest pain: Secondary | ICD-10-CM

## 2017-03-06 NOTE — Patient Instructions (Signed)
Medication Instructions:  °Your physician recommends that you continue on your current medications as directed. Please refer to the Current Medication list given to you today.  ° °Labwork: °NONE ° °Testing/Procedures: °Your physician has recommended that you have a pulmonary function test. Pulmonary Function Tests are a group of tests that measure how well air moves in and out of your lungs. ° °Follow-Up: °AS NEEDED  °  °

## 2017-03-06 NOTE — Progress Notes (Signed)
Cardiology Office Note   Date:  03/06/2017   ID:  [name], DOB [birth year], MRN [id number]  PCP:  Bing Neighbors, FNP  Cardiologist:   Chilton Si, MD   No chief complaint on file.    History of Present Illness: [name] is a 41 y.o. female who is being seen today for follow up.  She was initially seen 01/2017 for the evaluation of chest pain.  She has been seen in the emergency department several times with various complaints including chest pain, arm numbness, trouble swallowing, hives, hair loss, dysphagia, right hand and arm numbness and tingling.  Cardiac enzymes have been negative. EKGs have been negative for ischemia and showed sinus rhythm and tachycardia.  She was referred for ETT 01/24/17 that was negative for ischemia.  She achieved 11 METS on a Bruce protocol.  She also reported lower extremity edema, but none was noted on exam.  Since that appointment she was seen in the ED 02/19/17 with chest pain.  Cardiac enzymes were negative and chest CT-A was negative for PE.  No coronary calcification was noted.  She continues to have chest tightness twice per week.  It occurs when walking and improves with rest.  It lasts for several minutes at a time and is not associated with nausea or diaphoresis.  She has never smoked but reports that her son has asthma.   Past Medical History:  Diagnosis Date  . Allergy   . Anemia   . Anxiety   . Arthritis   . Constipation   . GERD (gastroesophageal reflux disease)   . Hx of endometriosis   . Macromastia 07/2011  . Migraine     Past Surgical History:  Procedure Laterality Date  . ABDOMINAL HYSTERECTOMY  12/12/2003  . ADENOIDECTOMY  05/2011  . ANKLE ARTHROSCOPY  12/29/2007   right; with extensive debridement  . BLADDER NECK RECONSTRUCTION  01/14/2011   Procedure: BLADDER NECK REPAIR;  Surgeon: Reva Bores, MD;  Location: [facility];  Service: Gynecology;  Laterality: N/A;  Laparoscopic Repair of Incidental Cystotomy  .  BREAST REDUCTION SURGERY  08/05/2011   Procedure: MAMMARY REDUCTION  (BREAST);  Surgeon: Louisa Second, MD;  Location: [facility];  Service: Plastics;  Laterality: Bilateral;  bilateral  . CESAREAN SECTION  12/29/2000; 1994  . DILATION AND CURETTAGE OF UTERUS  07/08/2003   open laparoscopy  . LAPAROSCOPIC SALPINGOOPHERECTOMY  01/14/2011   left  . REPAIR PERONEAL TENDONS ANKLE  01/03/2010   repair right subluxing peroneal tendons  . RIGHT OOPHORECTOMY     with lysis of adhesions  . rt. shoulder surgery    . SALPINGECTOMY  12/12/2003   right; with TAH  . TONSILLECTOMY  as a child  . TUBAL LIGATION  12/29/2000     Current Outpatient Prescriptions  Medication Sig Dispense Refill  . acetaminophen (TYLENOL) 500 MG tablet Take 1 tablet (500 mg total) by mouth every 6 (six) hours as needed for mild pain (pain). (Patient taking differently: Take 500 mg by mouth every 6 (six) hours as needed (for pain). ) 30 tablet 0  . albuterol (PROVENTIL HFA;VENTOLIN HFA) 108 (90 Base) MCG/ACT inhaler Inhale 1-2 puffs into the lungs every 6 (six) hours as needed for wheezing or shortness of breath (cough). (Patient taking differently: Inhale 1-2 puffs into the lungs every 6 (six) hours as needed (cough, shortness of breath, or wheezing). ) 1 Inhaler 2  . cetirizine (ZYRTEC) 10 MG tablet Take 1 tablet (  10 mg total) by mouth daily. 30 tablet 11  . esomeprazole (NEXIUM) 40 MG capsule TAKE 1 CAPSULE BY MOUTH EVERY MORNING (Patient taking differently: Take 40 mg by mouth in the morning) 90 capsule 1  . ferrous sulfate 325 (65 FE) MG EC tablet Take 1 tablet (325 mg total) by mouth 2 (two) times daily with a meal. 60 tablet 3  . triamcinolone cream (KENALOG) 0.1 % Apply 1 application topically 2 (two) times daily. (Patient taking differently: Apply 1 application topically 2 (two) times daily as needed (for irritation on face). ) 30 g 0  . zolpidem (AMBIEN) 5 MG tablet Take 1 tablet (5 mg total) by mouth at  bedtime as needed. for sleep (Patient taking differently: Take 5 mg by mouth at bedtime as needed for sleep. ) 30 tablet 0   No current facility-administered medications for this visit.     Allergies:   Patient has no known allergies.    Social History:  The patient  reports that she has never smoked. She has never used smokeless tobacco. She reports that she does not drink alcohol or use drugs.   Family History:  The patient's family history includes Diabetes in her mother; Heart attack in her maternal grandmother; Hypertension in her mother; Stroke in her maternal grandfather; Thyroid disease in her sister.    ROS:  Please see the history of present illness.   Otherwise, review of systems are positive for feet cramping.   All other systems are reviewed and negative.    PHYSICAL EXAM: VS:  BP 122/78   Pulse 85   Ht 5\' 4"  (1.626 m)   Wt 97.8 kg (215 lb 9.6 oz)   LMP 12/27/2003   BMI 37.01 kg/m  , BMI Body mass index is 37.01 kg/m. GENERAL:  Well appearing.  No acute distress.  HEENT: Pupils equal round and reactive, fundi not visualized, oral mucosa unremarkable NECK:  No jugular venous distention, waveform within normal limits, carotid upstroke brisk and symmetric, no bruits, no thyromegaly LUNGS:  Clear to auscultation bilaterally HEART:  RRR.  PMI not displaced or sustained,S1 and S2 within normal limits, no S3, no S4, no clicks, no rubs, no murmurs ABD:  Flat, positive bowel sounds normal in frequency in pitch, no bruits, no rebound, no guarding, no midline pulsatile mass, no hepatomegaly, no splenomegaly EXT:  2 plus pulses throughout, no edema, no cyanosis no clubbing SKIN:  No rashes no nodules NEURO:  Cranial nerves II through XII grossly intact, motor grossly intact throughout PSYCH:  Cognitively intact, oriented to person place and time  EKG:  EKG is ordered today. The ekg ordered today demonstrates sinus rhythm. Rate 70 bpm.  ETT 01/24/17:   Blood pressure  demonstrated a normal response to exercise.  There was no ST segment deviation noted during stress.   1. Good exercise tolerance.  2. No evidence for ischemia by ST segment analysis.     Recent Labs: 12/09/2016: TSH 3.821 02/19/2017: ALT 16; BUN 5; Creatinine, Ser 0.76; Hemoglobin 9.7; Platelets 301; Potassium 3.7; Sodium 139    Lipid Panel    Component Value Date/Time   CHOL 177 12/06/2015 1153   TRIG 64 12/06/2015 1153   HDL 55 12/06/2015 1153   CHOLHDL 3.2 12/06/2015 1153   VLDL 13 12/06/2015 1153   LDLCALC 109 12/06/2015 1153      Wt Readings from Last 3 Encounters:  03/06/17 97.8 kg (215 lb 9.6 oz)  02/20/17 97.7 kg (215 lb 6.4 oz)  02/13/17 97.3 kg (214 lb 8 oz)     ASSESSMENT AND PLAN:  # Chest pain:  [name]'s symptoms are not due to ischemia.  ETT was negative and there were no coronary calcifications on chest CT.  No further cardiac work up advised.  We will get PFTs.   Current medicines are reviewed at length with the patient today.  The patient does not have concerns regarding medicines.  The following changes have been made:  no change  Labs/ tests ordered today include:   Orders Placed This Encounter  Procedures  . Pulmonary function test     Disposition:   FU with Zerrick Hanssen C. Duke Salvia, MD, [facility] as needed.    This note was written with the assistance of speech recognition software.  Please excuse any transcriptional errors.  Signed, Shalini Mair C. Duke Salvia, MD, [facility]  03/06/2017 5:13 PM    [facility]

## 2017-03-10 ENCOUNTER — Emergency Department (HOSPITAL_COMMUNITY)
Admission: EM | Admit: 2017-03-10 | Discharge: 2017-03-10 | Disposition: A | Discharge status: Home / Self Care | Payer: No Typology Code available for payment source | Attending: Emergency Medicine | Admitting: Emergency Medicine

## 2017-03-10 ENCOUNTER — Emergency Department (HOSPITAL_COMMUNITY): Payer: No Typology Code available for payment source

## 2017-03-10 ENCOUNTER — Encounter (HOSPITAL_COMMUNITY): Payer: Self-pay | Admitting: Emergency Medicine

## 2017-03-10 DIAGNOSIS — M549 Dorsalgia, unspecified: Secondary | ICD-10-CM | POA: Insufficient documentation

## 2017-03-10 DIAGNOSIS — Z5321 Procedure and treatment not carried out due to patient leaving prior to being seen by health care provider: Secondary | ICD-10-CM | POA: Insufficient documentation

## 2017-03-10 DIAGNOSIS — R109 Unspecified abdominal pain: Secondary | ICD-10-CM | POA: Insufficient documentation

## 2017-03-10 NOTE — ED Notes (Signed)
Patient reports that she only wants to be stuck one time, so wants to wait until she gets to treatment room for blood draw.

## 2017-03-10 NOTE — ED Notes (Signed)
Pt called from the lobby with no response 

## 2017-03-11 ENCOUNTER — Telehealth: Payer: Self-pay

## 2017-03-11 NOTE — Telephone Encounter (Signed)
[facility]Mann[facility] at [phone] several times and requested GI records. They indicated they have faxed them several times but we have never rec'd them. I called again today and requested they be resent. Receptionist said she would inform Arline AspCindy of repeated requests.

## 2017-03-12 ENCOUNTER — Ambulatory Visit (HOSPITAL_COMMUNITY)
Admission: RE | Admit: 2017-03-12 | Discharge: 2017-03-12 | Disposition: A | Discharge status: Home / Self Care | Payer: No Typology Code available for payment source | Source: Ambulatory Visit | Attending: Cardiovascular Disease | Admitting: Cardiovascular Disease

## 2017-03-12 DIAGNOSIS — R0602 Shortness of breath: Secondary | ICD-10-CM | POA: Insufficient documentation

## 2017-03-12 LAB — PULMONARY FUNCTION TEST
DL/VA % pred: 113 %
DL/VA: 5.45 ml/min/mmHg/L
DLCO unc % pred: 97 %
DLCO unc: 23.63 ml/min/mmHg
FEF 25-75 Post: 2.7 L/sec
FEF 25-75 Pre: 2.36 L/sec
FEF2575-%Change-Post: 14 %
FEF2575-%Pred-Post: 97 %
FEF2575-%Pred-Pre: 85 %
FEV1-%Change-Post: 0 %
FEV1-%Pred-Post: 110 %
FEV1-%Pred-Pre: 110 %
FEV1-Post: 2.78 L
FEV1-Pre: 2.77 L
FEV1FVC-%Change-Post: 8 %
FEV1FVC-%Pred-Pre: 94 %
FEV6-%Change-Post: -5 %
FEV6-%Pred-Post: 108 %
FEV6-%Pred-Pre: 114 %
FEV6-Post: 3.25 L
FEV6-Pre: 3.44 L
FEV6FVC-%Change-Post: 2 %
FEV6FVC-%Pred-Post: 102 %
FEV6FVC-%Pred-Pre: 99 %
FVC-%Change-Post: -7 %
FVC-%Pred-Post: 106 %
FVC-%Pred-Pre: 114 %
FVC-Post: 3.25 L
FVC-Pre: 3.52 L
Post FEV1/FVC ratio: 86 %
Post FEV6/FVC ratio: 100 %
Pre FEV1/FVC ratio: 79 %
Pre FEV6/FVC Ratio: 98 %
RV % pred: 78 %
RV: 1.28 L
TLC % pred: 89 %
TLC: 4.54 L

## 2017-03-12 MED ORDER — ALBUTEROL SULFATE (2.5 MG/3ML) 0.083% IN NEBU
2.5000 mg | INHALATION_SOLUTION | Freq: Once | RESPIRATORY_TRACT | Status: AC
  Administered 2017-03-12: 2.5 mg via RESPIRATORY_TRACT

## 2017-03-14 ENCOUNTER — Emergency Department (HOSPITAL_COMMUNITY)
Admission: EM | Admit: 2017-03-14 | Discharge: 2017-03-15 | Disposition: A | Discharge status: Home / Self Care | Payer: No Typology Code available for payment source | Attending: Emergency Medicine | Admitting: Emergency Medicine

## 2017-03-14 ENCOUNTER — Encounter (HOSPITAL_COMMUNITY): Payer: Self-pay | Admitting: Emergency Medicine

## 2017-03-14 ENCOUNTER — Emergency Department (HOSPITAL_COMMUNITY): Payer: No Typology Code available for payment source

## 2017-03-14 ENCOUNTER — Other Ambulatory Visit: Payer: Self-pay

## 2017-03-14 DIAGNOSIS — R062 Wheezing: Secondary | ICD-10-CM | POA: Insufficient documentation

## 2017-03-14 DIAGNOSIS — J45901 Unspecified asthma with (acute) exacerbation: Secondary | ICD-10-CM

## 2017-03-14 DIAGNOSIS — R0602 Shortness of breath: Secondary | ICD-10-CM | POA: Insufficient documentation

## 2017-03-14 DIAGNOSIS — R072 Precordial pain: Secondary | ICD-10-CM | POA: Insufficient documentation

## 2017-03-14 DIAGNOSIS — D649 Anemia, unspecified: Secondary | ICD-10-CM | POA: Insufficient documentation

## 2017-03-14 DIAGNOSIS — Z79899 Other long term (current) drug therapy: Secondary | ICD-10-CM | POA: Insufficient documentation

## 2017-03-14 LAB — CBC
HCT: 33.2 % — ABNORMAL LOW (ref 36.0–46.0)
Hemoglobin: 10.6 g/dL — ABNORMAL LOW (ref 12.0–15.0)
MCH: 24.1 pg — ABNORMAL LOW (ref 26.0–34.0)
MCHC: 31.9 g/dL (ref 30.0–36.0)
MCV: 75.5 fL — ABNORMAL LOW (ref 78.0–100.0)
Platelets: 327 10*3/uL (ref 150–400)
RBC: 4.4 MIL/uL (ref 3.87–5.11)
RDW: 15.2 % (ref 11.5–15.5)
WBC: 6.1 10*3/uL (ref 4.0–10.5)

## 2017-03-14 LAB — BASIC METABOLIC PANEL
Anion gap: 8 (ref 5–15)
BUN: 7 mg/dL (ref 6–20)
CO2: 28 mmol/L (ref 22–32)
Calcium: 9.3 mg/dL (ref 8.9–10.3)
Chloride: 102 mmol/L (ref 101–111)
Creatinine, Ser: 0.79 mg/dL (ref 0.44–1.00)
GFR calc Af Amer: >60 mL/min (ref >60)
GFR calc non Af Amer: >60 mL/min (ref >60)
Glucose, Bld: 100 mg/dL — ABNORMAL HIGH (ref 65–99)
Potassium: 3.7 mmol/L (ref 3.5–5.1)
Sodium: 138 mmol/L (ref 135–145)

## 2017-03-14 LAB — I-STAT TROPONIN, ED: Troponin i, poc: 0 ng/mL (ref 0.00–0.08)

## 2017-03-14 MED ORDER — ALBUTEROL SULFATE (2.5 MG/3ML) 0.083% IN NEBU
5.0000 mg | INHALATION_SOLUTION | Freq: Once | RESPIRATORY_TRACT | Status: AC
  Administered 2017-03-14: 5 mg via RESPIRATORY_TRACT
  Filled 2017-03-14: qty 6

## 2017-03-14 MED ORDER — IPRATROPIUM BROMIDE 0.02 % IN SOLN
0.5000 mg | Freq: Once | RESPIRATORY_TRACT | Status: AC
  Administered 2017-03-14: 0.5 mg via RESPIRATORY_TRACT
  Filled 2017-03-14: qty 2.5

## 2017-03-14 NOTE — ED Provider Notes (Signed)
[facility]-EMERGENCY DEPT Provider Note   CSN: [id number] Arrival date & time: 03/14/17  1834     History   Chief Complaint Chief Complaint  Patient presents with  . Chest Pain  . Shortness of Breath    HPI [name] is a 41 y.o. female.  [name] is a 41 y.o. female who presents to the emergency department complaining of chest pain, wheezing, chest tightness and shortness of breath ongoing for several weeks that is worsened over the past 2 days.  Patient reports she has had wheezing and shortness of breath that is worse today and has had to use her albuterol inhaler 4 times a day.  This is more than she usually needs to use her albuterol inhaler.  She has been in the emergency department several times recently for chest pain.  Most recently about 3 weeks ago she was seen in the emergency department had a CT angiogram of her chest that was negative for pulmonary embolism or coronary artery calcifications.  She followed up with cardiologist Dr. Duke Salviaandolph about 7 days ago who reports that her chest pain is not cardiac in nature after her work up.  She sent the patient for pulmonary function testings, which we do not have the results back yet.  Patient reports that when she uses her albuterol inhaler it helps her symptoms for a brief period of time and then they return.  She denies fevers, coughing, abdominal pain, nausea, vomiting, lightheadedness, dizziness, syncope, leg pain, leg swelling, or rashes.   The history is provided by the patient and medical records. No language interpreter was used.  Chest Pain   Associated symptoms include shortness of breath. Pertinent negatives include no abdominal pain, no back pain, no cough, no fever, no headaches, no nausea, no palpitations, no vomiting and no weakness.  Shortness of Breath  Associated symptoms include wheezing and chest pain. Pertinent negatives include no fever, no headaches, no sore throat, no neck  pain, no cough, no vomiting, no abdominal pain, no rash and no leg swelling.    Past Medical History:  Diagnosis Date  . Allergy   . Anemia   . Anxiety   . Arthritis   . Constipation   . GERD (gastroesophageal reflux disease)   . Hx of endometriosis   . Macromastia 07/2011  . Migraine     Patient Active Problem List   Diagnosis Date Noted  . ANA positive 12/30/2016  . Gastroesophageal reflux disease 06/27/2016  . Disturbance of skin sensation 04/27/2014  . Dizziness and giddiness 03/14/2014  . Headache, migraine 03/14/2014  . Myalgia and myositis, unspecified 06/07/2013  . Nausea and vomiting in adult 08/22/2012  . Viral syndrome 08/22/2012  . Pelvic pain in female 12/18/2010  . PID (acute pelvic inflammatory disease) 12/18/2010  . ANEMIA-IRON DEFICIENCY 10/15/2006  . HERPES, GENITAL NOS 09/30/2006  . OBESITY NOS 09/30/2006  . DISORDER, NONORGANIC SLEEP NOS 09/30/2006  . CONSTIPATION NOS 09/30/2006  . NEURITIS, LUMBOSACRAL NOS 09/30/2006  . HX, PERSONAL, MENTAL DISORDER NOS 09/30/2006    Past Surgical History:  Procedure Laterality Date  . ABDOMINAL HYSTERECTOMY  12/12/2003  . ADENOIDECTOMY  05/2011  . ANKLE ARTHROSCOPY  12/29/2007   right; with extensive debridement  . CESAREAN SECTION  12/29/2000; 1994  . DILATION AND CURETTAGE OF UTERUS  07/08/2003   open laparoscopy  . LAPAROSCOPIC SALPINGOOPHERECTOMY  01/14/2011   left  . REPAIR PERONEAL TENDONS ANKLE  01/03/2010   repair right subluxing peroneal tendons  .  RIGHT OOPHORECTOMY     with lysis of adhesions  . rt. shoulder surgery    . SALPINGECTOMY  12/12/2003   right; with TAH  . TONSILLECTOMY  as a child  . TUBAL LIGATION  12/29/2000    OB History    Gravida Para Term Preterm AB Living   2 2 2     2    SAB TAB Ectopic Multiple Live Births                   Home Medications    Prior to Admission medications   Medication Sig Start Date End Date Taking? Authorizing Provider  acetaminophen (TYLENOL) 500 MG  tablet Take 1 tablet (500 mg total) by mouth every 6 (six) hours as needed for mild pain (pain). Patient taking differently: Take 500 mg by mouth every 6 (six) hours as needed (for pain).  07/24/16   Massie Maroon, FNP  albuterol (PROVENTIL HFA;VENTOLIN HFA) 108 (90 Base) MCG/ACT inhaler Inhale 1-2 puffs into the lungs every 6 (six) hours as needed for wheezing or shortness of breath (cough). Patient taking differently: Inhale 1-2 puffs into the lungs every 6 (six) hours as needed (cough, shortness of breath, or wheezing).  12/05/16   Bing Neighbors, FNP  cetirizine (ZYRTEC) 10 MG tablet Take 1 tablet (10 mg total) by mouth daily. 07/24/16   Massie Maroon, FNP  esomeprazole (NEXIUM) 40 MG capsule TAKE 1 CAPSULE BY MOUTH EVERY MORNING Patient taking differently: Take 40 mg by mouth in the morning 02/05/17   Bing Neighbors, FNP  ferrous sulfate 325 (65 FE) MG EC tablet Take 1 tablet (325 mg total) by mouth 2 (two) times daily with a meal. 02/05/17   Armbruster, Willaim Rayas, MD  predniSONE (DELTASONE) 20 MG tablet Take 2 tablets (40 mg total) daily by mouth. 03/15/17   Everlene Farrier, PA-C  triamcinolone cream (KENALOG) 0.1 % Apply 1 application topically 2 (two) times daily. Patient taking differently: Apply 1 application topically 2 (two) times daily as needed (for irritation on face).  12/30/16   Bing Neighbors, FNP  zolpidem (AMBIEN) 5 MG tablet Take 1 tablet (5 mg total) by mouth at bedtime as needed. for sleep Patient taking differently: Take 5 mg by mouth at bedtime as needed for sleep.  02/19/17   Bing Neighbors, FNP    Family History Family History  Problem Relation Age of Onset  . Diabetes Mother   . Hypertension Mother   . Thyroid disease Sister   . Heart attack Maternal Grandmother   . Stroke Maternal Grandfather   . Colon cancer Neg Hx   . Colon polyps Neg Hx   . Esophageal cancer Neg Hx   . Rectal cancer Neg Hx   . Stomach cancer Neg Hx     Social  History Social History   Tobacco Use  . Smoking status: Never Smoker  . Smokeless tobacco: Never Used  Substance Use Topics  . Alcohol use: Yes    Comment: occasional   . Drug use: No     Allergies   Patient has no known allergies.   Review of Systems Review of Systems  Constitutional: Negative for chills and fever.  HENT: Negative for congestion and sore throat.   Eyes: Negative for visual disturbance.  Respiratory: Positive for chest tightness, shortness of breath and wheezing. Negative for cough.   Cardiovascular: Positive for chest pain. Negative for palpitations and leg swelling.  Gastrointestinal: Negative for abdominal pain, diarrhea,  nausea and vomiting.  Genitourinary: Negative for dysuria.  Musculoskeletal: Negative for back pain and neck pain.  Skin: Negative for rash.  Neurological: Negative for weakness, light-headedness and headaches.     Physical Exam Updated Vital Signs BP (!) 151/87   Pulse 77   Temp 98.2 F (36.8 C) (Oral)   Resp (!) 21   LMP 12/27/2003   SpO2 100%   Physical Exam  Constitutional: She appears well-developed and well-nourished.  Non-toxic appearance. She does not appear ill. No distress.  HENT:  Head: Normocephalic and atraumatic.  Mouth/Throat: Oropharynx is clear and moist.  Eyes: Conjunctivae are normal. Pupils are equal, round, and reactive to light. Right eye exhibits no discharge. Left eye exhibits no discharge.  Neck: Neck supple.  Cardiovascular: Normal rate, regular rhythm, normal heart sounds and intact distal pulses. Exam reveals no gallop and no friction rub.  No murmur heard. Pulses:      Radial pulses are 2+ on the right side, and 2+ on the left side.       Dorsalis pedis pulses are 2+ on the right side, and 2+ on the left side.  Pulmonary/Chest: Effort normal. No accessory muscle usage. No tachypnea. No respiratory distress. She has no wheezes. She has no rales.  Slightly diminished lung sounds to her bilateral  bases.  No increased work of breathing.  Oxygen saturation is 100% on room air.  Respirations are 16 during my exam.  Abdominal: Soft. There is no tenderness.  Musculoskeletal: She exhibits no edema.       Right lower leg: She exhibits no tenderness and no edema.       Left lower leg: She exhibits no tenderness and no edema.  Lymphadenopathy:    She has no cervical adenopathy.  Neurological: She is alert. Coordination normal.  Skin: Skin is warm and dry. Capillary refill takes less than 2 seconds. No rash noted. She is not diaphoretic. No erythema. No pallor.  Psychiatric: She has a normal mood and affect. Her behavior is normal.  Nursing note and vitals reviewed.    ED Treatments / Results  Labs (all labs ordered are listed, but only abnormal results are displayed) Labs Reviewed  BASIC METABOLIC PANEL - Abnormal; Notable for the following components:      Result Value   Glucose, Bld 100 (*)    All other components within normal limits  CBC - Abnormal; Notable for the following components:   Hemoglobin 10.6 (*)    HCT 33.2 (*)    MCV 75.5 (*)    MCH 24.1 (*)    All other components within normal limits  I-STAT TROPONIN, ED    EKG  EKG Interpretation  Date/Time:  Friday March 14 2017 18:44:06 EST Ventricular Rate:  70 PR Interval:    QRS Duration: 95 QT Interval:  393 QTC Calculation: 424 R Axis:   84 Text Interpretation:  Sinus rhythm Baseline wander in lead(s) II III V3 No significant change was found Confirmed by Paula Libra ([id number]) on 03/14/2017 11:24:59 PM       Radiology Dg Chest 2 View  Result Date: 03/14/2017 CLINICAL DATA:  Chest pain, short of breath EXAM: CHEST  2 VIEW COMPARISON:  None. FINDINGS: Normal mediastinum and cardiac silhouette. Normal pulmonary vasculature. No evidence of effusion, infiltrate, or pneumothorax. No acute bony abnormality. IMPRESSION: No acute cardiopulmonary process. Electronically Signed   By: Genevive Bi M.D.   On:  03/14/2017 20:21    Procedures Procedures (including critical care time)  Medications Ordered in ED Medications  predniSONE (DELTASONE) tablet 40 mg (not administered)  albuterol (PROVENTIL) (2.5 MG/3ML) 0.083% nebulizer solution 5 mg (5 mg Nebulization Given 03/14/17 2334)  ipratropium (ATROVENT) nebulizer solution 0.5 mg (0.5 mg Nebulization Given 03/14/17 2334)     Initial Impression / Assessment and Plan / ED Course  I have reviewed the triage vital signs and the nursing notes.  Pertinent labs & imaging results that were available during my care of the patient were reviewed by me and considered in my medical decision making (see chart for details).      This is a 41 y.o. female who presents to the emergency department complaining of chest pain, wheezing, chest tightness and shortness of breath ongoing for several weeks that is worsened over the past 2 days.  Patient reports she has had wheezing and shortness of breath that is worse today and has had to use her albuterol inhaler 4 times a day.  This is more than she usually needs to use her albuterol inhaler.  She has been in the emergency department several times recently for chest pain.  Most recently about 3 weeks ago she was seen in the emergency department had a CT angiogram of her chest that was negative for pulmonary embolism or coronary artery calcifications.  She followed up with cardiologist Dr. Duke Salviaandolph about 7 days ago who reports that her chest pain is not cardiac in nature after her work up.  She sent the patient for pulmonary function testings, which we do not have the results back yet.  Patient reports that when she uses her albuterol inhaler it helps her symptoms for a brief period of time and then they return.  On exam the patient is afebrile nontoxic-appearing.  She has diminished lung sounds to her bilateral bases.  No increased work of breathing.  No tachypnea, tachycardia or hypoxia on exam.  EKG shows no significant  change from her last tracing.  No STEMI. Chest x-ray is unremarkable.  Troponin is not elevated.  BMP and CBC are unremarkable. Low suspicion for ACS based on this patient's history as well as her recent workup by cardiology that reports no ischemic chest pain.  She also had a recent CT angiogram of her chest without evidence of PE. At reevaluation following breathing treatment patient reports she is feeling much better.  She is no longer short of breath.  She reports her chest pain has resolved.  Will discharge with a course of steroids and I encouraged her to use her albuterol inhaler as needed.  She needs follow-up with cardiology to find out the results of her pulmonary function testing.  She may need to be on a maintenance inhaler for her wheezing.  I discussed strict and specific return precautions. I advised the patient to follow-up with their primary care provider this week. I advised the patient to return to the emergency department with new or worsening symptoms or new concerns. The patient verbalized understanding and agreement with plan.      Final Clinical Impressions(s) / ED Diagnoses   Final diagnoses:  Exacerbation of asthma, unspecified asthma severity, unspecified whether persistent  Precordial pain    ED Discharge Orders        Ordered    predniSONE (DELTASONE) 20 MG tablet  Daily     03/15/17 0107       Everlene FarrierDansie, Gerre Ranum, PA-C 03/15/17 0112    Molpus, Jonny RuizJohn, MD 03/15/17 [phone]

## 2017-03-14 NOTE — ED Triage Notes (Signed)
Pt complaint of central chest pain and SOB worsening over past few months; pt verbalizes seen by several people for same. Pt denies anything making pain better/worse.

## 2017-03-15 MED ORDER — PREDNISONE 20 MG PO TABS
40.0000 mg | ORAL_TABLET | Freq: Once | ORAL | Status: AC
  Administered 2017-03-15: 40 mg via ORAL
  Filled 2017-03-15: qty 2

## 2017-03-15 MED ORDER — PREDNISONE 20 MG PO TABS: 40.0000 mg | ORAL_TABLET | Freq: Every day | ORAL | 0 refills | Status: DC

## 2017-03-17 MED FILL — ?CETIRIZINE HCL 10 MG TABLE: 10 | 30 days supply | Qty: 30 | Fill #4

## 2017-03-17 MED FILL — ESOMEPRAZOLE MAGNESIUM 40 M: 40 | 30 days supply | Qty: 30 | Fill #1

## 2017-03-20 ENCOUNTER — Encounter: Payer: Self-pay | Admitting: Family Medicine

## 2017-03-20 ENCOUNTER — Other Ambulatory Visit: Payer: Self-pay | Admitting: Family Medicine

## 2017-03-21 MED ORDER — ZOLPIDEM TARTRATE 5 MG PO TABS: 5.0000 mg | ORAL_TABLET | Freq: Every evening | ORAL | 0 refills | Status: DC | PRN

## 2017-03-25 ENCOUNTER — Ambulatory Visit: Payer: No Typology Code available for payment source | Admitting: Family Medicine

## 2017-04-08 ENCOUNTER — Telehealth: Payer: Self-pay

## 2017-04-08 NOTE — Telephone Encounter (Signed)
Left message for patient to come get labs done at the end of December. She also needs to schedule a follow up visit in office for January.

## 2017-04-08 NOTE — Telephone Encounter (Signed)
-----   Message from Leverne HumblesJulia M Fournier, RN sent at 02/27/2017  8:46 AM EDT ----- Remind patient to come get labs done at end of Dec., order in Epic, needs follow up visit in Jan

## 2017-04-09 NOTE — Telephone Encounter (Signed)
Patient notified to come get labs done before end of December but did not see labs in system and follow up ov schedule for 1.28.19.

## 2017-04-16 ENCOUNTER — Other Ambulatory Visit (INDEPENDENT_AMBULATORY_CARE_PROVIDER_SITE_OTHER): Payer: No Typology Code available for payment source

## 2017-04-16 ENCOUNTER — Other Ambulatory Visit: Payer: Self-pay

## 2017-04-16 DIAGNOSIS — D509 Iron deficiency anemia, unspecified: Secondary | ICD-10-CM

## 2017-04-16 LAB — IBC PANEL
Iron: 61 ug/dL (ref 42–145)
Saturation Ratios: 12.4 % — ABNORMAL LOW (ref 20.0–50.0)
Transferrin: 350 mg/dL (ref 212.0–360.0)

## 2017-04-16 LAB — FERRITIN: Ferritin: 8.4 ng/mL — ABNORMAL LOW (ref 10.0–291.0)

## 2017-04-16 LAB — CBC WITH DIFFERENTIAL/PLATELET
Basophils Absolute: 0 10*3/uL (ref 0.0–0.1)
Basophils Relative: 0.9 % (ref 0.0–3.0)
Eosinophils Absolute: 0 10*3/uL (ref 0.0–0.7)
Eosinophils Relative: 0.7 % (ref 0.0–5.0)
HCT: 31.2 % — ABNORMAL LOW (ref 36.0–46.0)
Hemoglobin: 10.1 g/dL — ABNORMAL LOW (ref 12.0–15.0)
Lymphocytes Relative: 51.6 % — ABNORMAL HIGH (ref 12.0–46.0)
Lymphs Abs: 2.6 10*3/uL (ref 0.7–4.0)
MCHC: 32.3 g/dL (ref 30.0–36.0)
MCV: 75.4 fl — ABNORMAL LOW (ref 78.0–100.0)
Monocytes Absolute: 0.5 10*3/uL (ref 0.1–1.0)
Monocytes Relative: 9.2 % (ref 3.0–12.0)
Neutro Abs: 1.9 10*3/uL (ref 1.4–7.7)
Neutrophils Relative %: 37.6 % — ABNORMAL LOW (ref 43.0–77.0)
Platelets: 320 10*3/uL (ref 150.0–400.0)
RBC: 4.13 Mil/uL (ref 3.87–5.11)
RDW: 16.3 % — ABNORMAL HIGH (ref 11.5–15.5)
WBC: 5.1 10*3/uL (ref 4.0–10.5)

## 2017-04-21 ENCOUNTER — Other Ambulatory Visit: Payer: Self-pay | Admitting: Family Medicine

## 2017-04-22 MED ORDER — ZOLPIDEM TARTRATE 5 MG PO TABS: 5.0000 mg | ORAL_TABLET | Freq: Every evening | ORAL | 0 refills | Status: DC | PRN

## 2017-04-23 ENCOUNTER — Other Ambulatory Visit: Payer: Self-pay

## 2017-04-23 ENCOUNTER — Telehealth: Payer: Self-pay | Admitting: Gastroenterology

## 2017-04-23 DIAGNOSIS — D509 Iron deficiency anemia, unspecified: Secondary | ICD-10-CM

## 2017-04-23 NOTE — Telephone Encounter (Signed)
Called patient back, I purposely scheduled it on Wed since she would have to do a prep the day before. She was going out of town and not sure if she would be back in enough time to do the prep. Told her she could call back and reschedule if needed.

## 2017-04-28 NOTE — Telephone Encounter (Signed)
Pts capsule moved to 05/13/17 and prep reviewed with pt and questions were answered.

## 2017-04-28 NOTE — Telephone Encounter (Signed)
Patient returning phone call to Raynelle FanningJulie to reschedule capsule Endoscopy best call back # [phone].

## 2017-05-02 ENCOUNTER — Encounter: Payer: Self-pay | Admitting: Gastroenterology

## 2017-05-07 ENCOUNTER — Encounter (HOSPITAL_COMMUNITY): Payer: No Typology Code available for payment source

## 2017-05-07 ENCOUNTER — Other Ambulatory Visit (HOSPITAL_COMMUNITY): Payer: Self-pay | Admitting: *Deleted

## 2017-05-08 ENCOUNTER — Telehealth: Payer: Self-pay | Admitting: Gastroenterology

## 2017-05-08 ENCOUNTER — Ambulatory Visit (HOSPITAL_COMMUNITY)
Admission: RE | Admit: 2017-05-08 | Discharge: 2017-05-08 | Disposition: A | Discharge status: Home / Self Care | Payer: BLUE CROSS/BLUE SHIELD | Source: Ambulatory Visit | Attending: Gastroenterology | Admitting: Gastroenterology

## 2017-05-08 DIAGNOSIS — D509 Iron deficiency anemia, unspecified: Secondary | ICD-10-CM

## 2017-05-08 MED ORDER — SODIUM CHLORIDE 0.9 % IV SOLN
510.0000 mg | INTRAVENOUS | Status: DC
  Administered 2017-05-08: 510 mg via INTRAVENOUS
  Filled 2017-05-08: qty 17

## 2017-05-08 NOTE — Telephone Encounter (Signed)
FYI

## 2017-05-08 NOTE — Discharge Instructions (Signed)

## 2017-05-08 NOTE — Telephone Encounter (Signed)
Okay; thanks.

## 2017-05-13 ENCOUNTER — Encounter (HOSPITAL_COMMUNITY)
Admission: RE | Admit: 2017-05-13 | Discharge: 2017-05-13 | Disposition: A | Discharge status: Home / Self Care | Payer: BLUE CROSS/BLUE SHIELD | Source: Ambulatory Visit | Attending: Gastroenterology | Admitting: Gastroenterology

## 2017-05-13 DIAGNOSIS — N3020 Other chronic cystitis without hematuria: Secondary | ICD-10-CM | POA: Diagnosis not present

## 2017-05-13 DIAGNOSIS — D509 Iron deficiency anemia, unspecified: Secondary | ICD-10-CM | POA: Diagnosis not present

## 2017-05-13 DIAGNOSIS — N3946 Mixed incontinence: Secondary | ICD-10-CM | POA: Diagnosis not present

## 2017-05-13 DIAGNOSIS — R3914 Feeling of incomplete bladder emptying: Secondary | ICD-10-CM | POA: Diagnosis not present

## 2017-05-13 MED ORDER — SODIUM CHLORIDE 0.9 % IV SOLN
510.0000 mg | INTRAVENOUS | Status: DC
  Administered 2017-05-13: 510 mg via INTRAVENOUS
  Filled 2017-05-13: qty 17

## 2017-05-13 MED ORDER — SODIUM CHLORIDE 0.9 % IV SOLN
510.0000 mg | INTRAVENOUS | Status: DC
  Filled 2017-05-13: qty 17

## 2017-05-19 ENCOUNTER — Ambulatory Visit: Payer: Self-pay | Admitting: Diagnostic Neuroimaging

## 2017-05-21 ENCOUNTER — Other Ambulatory Visit: Payer: Self-pay | Admitting: Family Medicine

## 2017-05-21 ENCOUNTER — Telehealth: Payer: Self-pay

## 2017-05-21 NOTE — Telephone Encounter (Signed)
Spoke to patient on 05/13/17, patient aware that we are unable to fix our endo capsule and [facility]. She would rather wait until our office gets the new equipment.

## 2017-05-22 ENCOUNTER — Other Ambulatory Visit: Payer: Self-pay | Admitting: Family Medicine

## 2017-05-22 MED ORDER — ZOLPIDEM TARTRATE 5 MG PO TABS: 5.0000 mg | ORAL_TABLET | Freq: Every evening | ORAL | 0 refills | Status: DC | PRN

## 2017-05-23 ENCOUNTER — Other Ambulatory Visit: Payer: Self-pay | Admitting: Family Medicine

## 2017-05-23 MED ORDER — ZOLPIDEM TARTRATE 5 MG PO TABS: 5.0000 mg | ORAL_TABLET | Freq: Every evening | ORAL | 0 refills | Status: DC | PRN

## 2017-05-27 ENCOUNTER — Encounter: Payer: Self-pay | Admitting: Diagnostic Neuroimaging

## 2017-05-27 ENCOUNTER — Ambulatory Visit: Payer: BLUE CROSS/BLUE SHIELD | Admitting: Diagnostic Neuroimaging

## 2017-05-27 VITALS — BP 141/89 | HR 76 | Ht 64.0 in | Wt 231.0 lb

## 2017-05-27 DIAGNOSIS — M542 Cervicalgia: Secondary | ICD-10-CM

## 2017-05-27 DIAGNOSIS — F450 Somatization disorder: Secondary | ICD-10-CM

## 2017-05-27 DIAGNOSIS — G43109 Migraine with aura, not intractable, without status migrainosus: Secondary | ICD-10-CM

## 2017-05-27 DIAGNOSIS — F458 Other somatoform disorders: Secondary | ICD-10-CM | POA: Diagnosis not present

## 2017-05-27 DIAGNOSIS — M797 Fibromyalgia: Secondary | ICD-10-CM | POA: Diagnosis not present

## 2017-05-27 DIAGNOSIS — R252 Cramp and spasm: Secondary | ICD-10-CM

## 2017-05-27 DIAGNOSIS — G4700 Insomnia, unspecified: Secondary | ICD-10-CM

## 2017-05-27 DIAGNOSIS — R0989 Other specified symptoms and signs involving the circulatory and respiratory systems: Secondary | ICD-10-CM

## 2017-05-27 DIAGNOSIS — R198 Other specified symptoms and signs involving the digestive system and abdomen: Secondary | ICD-10-CM

## 2017-05-27 DIAGNOSIS — R208 Other disturbances of skin sensation: Secondary | ICD-10-CM | POA: Diagnosis not present

## 2017-05-27 NOTE — Patient Instructions (Signed)
-   encouraged daily exercise regimen; consider PT / OT evaluation - optimize nutrition - optimize stress mgmt techniques - consider psychiatry / psychology evaluation - migraine are under control for now; may consider amitriptyline or venlafaxine in future for migraine prevention

## 2017-05-27 NOTE — Progress Notes (Signed)
[facility]  PATIENT: [name] DOB: 1976-01-19  REFERRING CLINICIAN: Odem HISTORY FROM: patient  REASON FOR VISIT: follow up   HISTORICAL  CHIEF COMPLAINT:  Chief Complaint  Patient presents with  . Follow-up  . Migraine    migraines less in frequency,  (seeing GI for neck/throat,dx anemic has had iron infusions, fatigue , buzzing sensation under skin arms and legs)  numbness/electrical shocks feet,  arms and legs.Has rheumatology ref.     HISTORY OF PRESENT ILLNESS:   UPDATE (05/27/17, VRP): Since last visit, doing worse. More scalp pain (hair pulling sensation).  More pain, fatigue, chills, weakness. More insomnia (now on [place]). More eye pain, aches, urinary urgency, muscle cramps.   UPDATE 05/31/14: Since last visit, tried increased TPX 100mg  BID and rizatriptan, without relief. In fact she feels her headaches are worse. Also c/o of bilateral pre-auricular and TMJ pain with jaw movements. Still with poor sleep (trouble falling asleep, staying asleep, not refreshed when she wakes up). Thinks she had a sleep study in the past. I looked it up from 2011 (patient's name was [name]) and it was normal PSG.   UPDATE 04/15/14: Since last visit, doing worse. More headaches. TPX not helping. Sumatriptan not helping. Went to ER for worsening HA. Had CT, CTA head/neck which were unremarkable. Now with trouble with driving directions and memory. Also reports 1 month of urinary incontinence, unexplained, back in April 2015.   PRIOR HPI (03/07/14): 42 year old right-handed female here for evaluation of headaches. 2003 patient had onset of "tension headaches" which she describes as bandlike pressure in the forehead. No associated symptoms. Patient was seen at [facility] neurologic in 2011 by Dr. Terrace Arabia, diagnosed with migraine. Patient was treated with medication but patient does not recall details. Since February 03, 2014 patient reports a new type of headache consisting  of right-sided severe throbbing, burning, aching pain with mild nausea. No photophobia or phonophobia. Pain radiates down the right side of her face, into her right neck and throat. Chewing aggravates the symptoms. No warning symptoms or visual changes. This is a new type of headache compared to previously. Patient went to [facility] ER last week for evaluation. Patient has been on topiramate for past couple of weeks with some improvement in symptoms. Patient is having daily, multiple headaches. Each headache lasts at least one hour. Patient using Fioricet and ibuprofen as needed for breakthrough headache. Patient has sister with severe migraine headaches. Recent changes include change in her job start time. Patient is to wake up at 6:30 in the morning. Now she has to wake up at 4:30 in the morning. However she has been unable to adjust her sleep time. Patient averaging 5 hours of sleep per night, since October 1. Previously she was sleeping 7 hours per night.   REVIEW OF SYSTEMS: Full 14 system review of systems performed and notable only for headache insomnia incontinence fatigue freq urination.     ALLERGIES: No Known Allergies  HOME MEDICATIONS: Outpatient Medications Prior to Visit  Medication Sig Dispense Refill  . acetaminophen (TYLENOL) 500 MG tablet Take 1 tablet (500 mg total) by mouth every 6 (six) hours as needed for mild pain (pain). (Patient taking differently: Take 500 mg by mouth every 6 (six) hours as needed (for pain). ) 30 tablet 0  . albuterol (PROVENTIL HFA;VENTOLIN HFA) 108 (90 Base) MCG/ACT inhaler Inhale 1-2 puffs into the lungs every 6 (six) hours as needed for wheezing or shortness of breath (cough). (Patient taking  differently: Inhale 1-2 puffs into the lungs every 6 (six) hours as needed (cough, shortness of breath, or wheezing). ) 1 Inhaler 2  . cetirizine (ZYRTEC) 10 MG tablet Take 1 tablet (10 mg total) by mouth daily. 30 tablet 11  . esomeprazole (NEXIUM) 40  MG capsule TAKE 1 CAPSULE BY MOUTH EVERY MORNING (Patient taking differently: Take 40 mg by mouth in the morning) 90 capsule 1  . ferrous sulfate 325 (65 FE) MG EC tablet Take 1 tablet (325 mg total) by mouth 2 (two) times daily with a meal. 60 tablet 3  . triamcinolone cream (KENALOG) 0.1 % Apply 1 application topically 2 (two) times daily. (Patient taking differently: Apply 1 application topically 2 (two) times daily as needed (for irritation on face). ) 30 g 0  . zolpidem (AMBIEN) 5 MG tablet Take 1 tablet (5 mg total) by mouth at bedtime as needed. for sleep 30 tablet 0  . predniSONE (DELTASONE) 20 MG tablet Take 2 tablets (40 mg total) daily by mouth. 10 tablet 0   No facility-administered medications prior to visit.     PAST MEDICAL HISTORY: Past Medical History:  Diagnosis Date  . Allergy   . Anemia   . Anxiety   . Arthritis   . Constipation   . GERD (gastroesophageal reflux disease)   . Hx of endometriosis   . Macromastia 07/2011  . Migraine     PAST SURGICAL HISTORY: Past Surgical History:  Procedure Laterality Date  . ABDOMINAL HYSTERECTOMY  12/12/2003  . ADENOIDECTOMY  05/2011  . ANKLE ARTHROSCOPY  12/29/2007   right; with extensive debridement  . BLADDER NECK RECONSTRUCTION  01/14/2011   Procedure: BLADDER NECK REPAIR;  Surgeon: Reva Bores, MD;  Location: [facility];  Service: Gynecology;  Laterality: N/A;  Laparoscopic Repair of Incidental Cystotomy  . BREAST REDUCTION SURGERY  08/05/2011   Procedure: MAMMARY REDUCTION  (BREAST);  Surgeon: Louisa Second, MD;  Location: [facility];  Service: Plastics;  Laterality: Bilateral;  bilateral  . CESAREAN SECTION  12/29/2000; 1994  . DILATION AND CURETTAGE OF UTERUS  07/08/2003   open laparoscopy  . LAPAROSCOPIC SALPINGOOPHERECTOMY  01/14/2011   left  . REPAIR PERONEAL TENDONS ANKLE  01/03/2010   repair right subluxing peroneal tendons  . RIGHT OOPHORECTOMY     with lysis of adhesions  . rt. shoulder surgery    .  SALPINGECTOMY  12/12/2003   right; with TAH  . TONSILLECTOMY  as a child  . TUBAL LIGATION  12/29/2000    FAMILY HISTORY: Family History  Problem Relation Age of Onset  . Diabetes Mother   . Hypertension Mother   . Thyroid disease Sister   . Heart attack Maternal Grandmother   . Stroke Maternal Grandfather   . Colon cancer Neg Hx   . Colon polyps Neg Hx   . Esophageal cancer Neg Hx   . Rectal cancer Neg Hx   . Stomach cancer Neg Hx     SOCIAL HISTORY:  Social History   Socioeconomic History  . Marital status: Divorced    Spouse name: Not on file  . Number of children: 2  . Years of education: 12th  . Highest education level: Not on file  Social Needs  . Financial resource strain: Not on file  . Food insecurity - worry: Not on file  . Food insecurity - inability: Not on file  . Transportation needs - medical: Not on file  . Transportation needs - non-medical: Not on file  Occupational History    Employer: OTHER    Comment: [employer]  Tobacco Use  . Smoking status: Never Smoker  . Smokeless tobacco: Never Used  Substance and Sexual Activity  . Alcohol use: Yes    Comment: occasional   . Drug use: No  . Sexual activity: Not on file  Other Topics Concern  . Not on file  Social History Narrative   Patient lives at home with family.   Caffeine Use: 1 cup daily     PHYSICAL EXAM  Vitals:   05/27/17 0807  BP: (!) 141/89  Pulse: 76  Weight: 231 lb (104.8 kg)  Height: 5\' 4"  (1.626 m)    Not recorded     No exam data present   Body mass index is 39.65 kg/m.  GENERAL EXAM: Patient is calm, but mildly irritable from headache pain; well developed, nourished and groomed; neck is supple  CARDIOVASCULAR: Regular rate and rhythm, no murmurs, no carotid bruits  NEUROLOGIC: MENTAL STATUS: awake, alert, language fluent, comprehension intact, naming intact, fund of knowledge appropriate CRANIAL NERVE: no papilledema on fundoscopic exam, pupils equal and  reactive to light, visual fields full to confrontation, extraocular muscles intact, no nystagmus, facial sensation and strength symmetric, hearing intact, palate elevates symmetrically, uvula midline, shoulder shrug symmetric, tongue midline. MOTOR: normal bulk and tone, full strength in the BUE, BLE SENSORY: normal and symmetric to light touch, temperature, vibration  COORDINATION: finger-nose-finger, fine finger movements normal REFLEXES: deep tendon reflexes present and symmetric GAIT/STATION: narrow based gait    DIAGNOSTIC DATA (LABS, IMAGING, TESTING) - I reviewed patient records, labs, notes, testing and imaging myself where available.  Lab Results  Component Value Date   WBC 5.1 04/16/2017   HGB 10.1 (L) 04/16/2017   HCT 31.2 (L) 04/16/2017   MCV 75.4 (L) 04/16/2017   PLT 320.0 04/16/2017      Component Value Date/Time   NA 138 03/14/2017 1912   K 3.7 03/14/2017 1912   CL 102 03/14/2017 1912   CO2 28 03/14/2017 1912   GLUCOSE 100 (H) 03/14/2017 1912   BUN 7 03/14/2017 1912   CREATININE 0.79 03/14/2017 1912   CREATININE 0.77 12/23/2016 1531   CALCIUM 9.3 03/14/2017 1912   PROT 6.8 02/19/2017 1647   ALBUMIN 3.6 02/19/2017 1647   AST 22 02/19/2017 1647   ALT 16 02/19/2017 1647   ALKPHOS 58 02/19/2017 1647   BILITOT 0.4 02/19/2017 1647   GFRNONAA >60 03/14/2017 1912   GFRNONAA >89 12/23/2016 1531   GFRAA >60 03/14/2017 1912   GFRAA >89 12/23/2016 1531   Lab Results  Component Value Date   CHOL 177 12/06/2015   HDL 55 12/06/2015   LDLCALC 109 12/06/2015   TRIG 64 12/06/2015   CHOLHDL 3.2 12/06/2015   No results found for: HGBA1C Lab Results  Component Value Date   VITAMINB12 818 12/18/2008   Lab Results  Component Value Date   TSH 3.821 12/09/2016   I reviewed images myself and agree with interpretation. -VRP  03/13/14 CT head - normal  03/13/14 CTA head/neck - normal  05/02/14 MRI brain - normal  05/02/14 MRI cervical spine - normal  08/05/14  PSG - normal AHI - intermittent snoring  01/16/17 MRI brain  1. Unremarkable MRI of the brain for age. 2. Mild paranasal sinus disease predominantly in ethmoid air cells.  02/19/17 CT angiogram chest  - No evidence of pulmonary emboli.  No acute abnormality is seen.       ASSESSMENT AND  PLAN  42 y.o. year old female here with worsening headaches since 02/03/2014, suspicious for migraine without aura, with longer standing headaches since at least 2011. Also with significant anxiety, depression, sleep disturbances.    Dx:   1. Allodynia   2. Migraine with aura and without status migrainosus, not intractable   3. Muscle cramps   4. Globus sensation   5. Neck pain   6. Insomnia, unspecified type   7. Somatization disorder   8. Fibromyalgia      PLAN:  - encouraged daily exercise regimen; consider PT / OT evaluation - optimize nutrition - optimize stress mgmt techniques - consider psychiatry / psychology evaluation - migraine are under control for now; may consider amitriptyline or venlafaxine in future for migraine prevention  Return if symptoms worsen or fail to improve, for return to PCP.    Suanne MarkerVIKRAM R. Lissete Maestas, MD 05/27/2017, 8:41 AM Certified in Neurology, Neurophysiology and Neuroimaging  [facility] [address], [address] [place], [place] [id number] [phone]

## 2017-06-02 ENCOUNTER — Other Ambulatory Visit (INDEPENDENT_AMBULATORY_CARE_PROVIDER_SITE_OTHER): Payer: BLUE CROSS/BLUE SHIELD

## 2017-06-02 ENCOUNTER — Ambulatory Visit (INDEPENDENT_AMBULATORY_CARE_PROVIDER_SITE_OTHER): Payer: BLUE CROSS/BLUE SHIELD | Admitting: Gastroenterology

## 2017-06-02 VITALS — BP 122/72 | HR 76 | Ht 64.0 in | Wt 227.0 lb

## 2017-06-02 DIAGNOSIS — R140 Abdominal distension (gaseous): Secondary | ICD-10-CM

## 2017-06-02 DIAGNOSIS — D509 Iron deficiency anemia, unspecified: Secondary | ICD-10-CM

## 2017-06-02 LAB — CBC WITH DIFFERENTIAL/PLATELET
Basophils Absolute: 0 10*3/uL (ref 0.0–0.1)
Basophils Relative: 1 % (ref 0.0–3.0)
Eosinophils Absolute: 0 10*3/uL (ref 0.0–0.7)
Eosinophils Relative: 1 % (ref 0.0–5.0)
HCT: 36.2 % (ref 36.0–46.0)
Hemoglobin: 11.7 g/dL — ABNORMAL LOW (ref 12.0–15.0)
Lymphocytes Relative: 44.2 % (ref 12.0–46.0)
Lymphs Abs: 2.2 10*3/uL (ref 0.7–4.0)
MCHC: 32.3 g/dL (ref 30.0–36.0)
MCV: 77 fl — ABNORMAL LOW (ref 78.0–100.0)
Monocytes Absolute: 0.4 10*3/uL (ref 0.1–1.0)
Monocytes Relative: 8.5 % (ref 3.0–12.0)
Neutro Abs: 2.2 10*3/uL (ref 1.4–7.7)
Neutrophils Relative %: 45.3 % (ref 43.0–77.0)
Platelets: 313 10*3/uL (ref 150.0–400.0)
RBC: 4.7 Mil/uL (ref 3.87–5.11)
RDW: 17.9 % — ABNORMAL HIGH (ref 11.5–15.5)
WBC: 4.9 10*3/uL (ref 4.0–10.5)

## 2017-06-02 LAB — FERRITIN: Ferritin: 285.4 ng/mL (ref 10.0–291.0)

## 2017-06-02 LAB — IBC PANEL
Iron: 137 ug/dL (ref 42–145)
Saturation Ratios: 34.5 % (ref 20.0–50.0)
Transferrin: 284 mg/dL (ref 212.0–360.0)

## 2017-06-02 NOTE — Patient Instructions (Addendum)
If you are age 42 or older, your body mass index should be between 23-30. Your Body mass index is 38.96 kg/m. If this is out of the aforementioned range listed, please consider follow up with your Primary Care Provider.  If you are age 42 or younger, your body mass index should be between 19-25. Your Body mass index is 38.96 kg/m. If this is out of the aformentioned range listed, please consider follow up with your Primary Care Provider.   We have given you a Low FOD-Map diet to follow.  We have given you samples of the following medication to take: VSL#3, 112.5 billion - Take one capsule twice a day.  If this is helpful you can purchase them over the counter.  This medication needs to be refrigerated.  Please go to the lab in the basement of our building to have lab work done as you leave today.  Thank you for entrusting me with your care and for choosing [facility], Dr. Ileene PatrickSteven Armbruster

## 2017-06-02 NOTE — Progress Notes (Signed)
HPI :  42 y/o female here for a follow up visit. She has a history of GERD, was initially referred for "throat pain" and changes in her voice with question if related to reflux or not. She ended up being referred to ENT. I don't think any significant pathology noted on laryngoscopy, she states she was referred to rheumatology by them for a positive ANA. She states her throat is not really bothering her too much anymore, it comes and goes. She continues to take her PPI and reflux seems controlled at this point.  Routine blood work showed microscopic anemia, with iron deficiency. In light of her history of hysterectomy she was referred for an EGD and colonoscopy. No cause for her anemia was noted on these exams as below. Despite oral iron supplementation her anemia has persisted - repeat iron studies showed ferritin of 8.4, iron level of 61 with iron sat of 12%, Hgb of 10.1 with MCV 75  leading to IV iron infusions. She had 2 feraheme infusions up to this time, she states they cause severe "fatigue" and did not like the way they made her feel. She was referred for a capsule endoscopy but has not had it done at this point.  She otherwise had an MRI of her liver to evaluate a prior CT abnormality of the liver. This was found to represent benign focal fatty infiltration.   She denies any problems with her bowels at this time, she is not seeing any blood in her stools. She does not have any abdominal pain but she does have ongoing abdominal bloating which bothers her. She states she feels distended which can come and go, she is not sure if specific foods would cause this.  Recent workup: EGD 02/20/2017 - 2cm HH, otherwise normal exam, biopsies negative for HP Colonoscopy 02/20/2017 - focal mild erythema in the rectum - biopsied showed no evidence of colitis, otherwise normal colon and ileum  MRI liver / abdomen - 02/10/2017 - focal fatty infiltration, normal biliary tree, otherwise no pathology noted in the  abdomen  Historic evaluation: Recent workup: CT scan neck 12/02/16 - normal CT angio of chest 12/02/2016 - normal EGD 08/19/2013 - Dr. [name] - normal esophagus, mild gastritis Esophageal manometry 2015 - normal PH study 2015 - normal exam, Demeester score of 5.7, positive symptom index for regurgitation and reflux EGG normal 2015 Normal hydrogen breath test 2015 Normal gastric emptying scan 2015   Past Medical History:  Diagnosis Date  . Allergy   . Anemia   . Anxiety   . Arthritis   . Constipation   . GERD (gastroesophageal reflux disease)   . Hx of endometriosis   . Macromastia 07/2011  . Migraine      Past Surgical History:  Procedure Laterality Date  . ABDOMINAL HYSTERECTOMY  12/12/2003  . ADENOIDECTOMY  05/2011  . ANKLE ARTHROSCOPY  12/29/2007   right; with extensive debridement  . BLADDER NECK RECONSTRUCTION  01/14/2011   Procedure: BLADDER NECK REPAIR;  Surgeon: Reva Boresanya S Pratt, MD;  Location: [facility];  Service: Gynecology;  Laterality: N/A;  Laparoscopic Repair of Incidental Cystotomy  . BREAST REDUCTION SURGERY  08/05/2011   Procedure: MAMMARY REDUCTION  (BREAST);  Surgeon: Louisa SecondGerald Truesdale, MD;  Location: [facility];  Service: Plastics;  Laterality: Bilateral;  bilateral  . CESAREAN SECTION  12/29/2000; 1994  . DILATION AND CURETTAGE OF UTERUS  07/08/2003   open laparoscopy  . LAPAROSCOPIC SALPINGOOPHERECTOMY  01/14/2011   left  . REPAIR  PERONEAL TENDONS ANKLE  01/03/2010   repair right subluxing peroneal tendons  . RIGHT OOPHORECTOMY     with lysis of adhesions  . rt. shoulder surgery    . SALPINGECTOMY  12/12/2003   right; with TAH  . TONSILLECTOMY  as a child  . TUBAL LIGATION  12/29/2000   Family History  Problem Relation Age of Onset  . Diabetes Mother   . Hypertension Mother   . Thyroid disease Sister   . Heart attack Maternal Grandmother   . Stroke Maternal Grandfather   . Colon cancer Neg Hx   . Colon polyps Neg Hx   . Esophageal cancer Neg Hx     . Rectal cancer Neg Hx   . Stomach cancer Neg Hx    Social History   Tobacco Use  . Smoking status: Never Smoker  . Smokeless tobacco: Never Used  Substance Use Topics  . Alcohol use: Yes    Comment: occasional   . Drug use: No   Current Outpatient Medications  Medication Sig Dispense Refill  . acetaminophen (TYLENOL) 500 MG tablet Take 1 tablet (500 mg total) by mouth every 6 (six) hours as needed for mild pain (pain). (Patient taking differently: Take 500 mg by mouth every 6 (six) hours as needed (for pain). ) 30 tablet 0  . albuterol (PROVENTIL HFA;VENTOLIN HFA) 108 (90 Base) MCG/ACT inhaler Inhale 1-2 puffs into the lungs every 6 (six) hours as needed for wheezing or shortness of breath (cough). (Patient taking differently: Inhale 1-2 puffs into the lungs every 6 (six) hours as needed (cough, shortness of breath, or wheezing). ) 1 Inhaler 2  . cetirizine (ZYRTEC) 10 MG tablet Take 1 tablet (10 mg total) by mouth daily. 30 tablet 11  . esomeprazole (NEXIUM) 40 MG capsule TAKE 1 CAPSULE BY MOUTH EVERY MORNING (Patient taking differently: Take 40 mg by mouth in the morning) 90 capsule 1  . ferrous sulfate 325 (65 FE) MG EC tablet Take 1 tablet (325 mg total) by mouth 2 (two) times daily with a meal. 60 tablet 3  . triamcinolone cream (KENALOG) 0.1 % Apply 1 application topically 2 (two) times daily. (Patient taking differently: Apply 1 application topically 2 (two) times daily as needed (for irritation on face). ) 30 g 0  . zolpidem (AMBIEN) 5 MG tablet Take 1 tablet (5 mg total) by mouth at bedtime as needed. for sleep 30 tablet 0   No current facility-administered medications for this visit.    No Known Allergies   Review of Systems: All systems reviewed and negative except where noted in HPI.   Lab Results  Component Value Date   WBC 5.1 04/16/2017   HGB 10.1 (L) 04/16/2017   HCT 31.2 (L) 04/16/2017   MCV 75.4 (L) 04/16/2017   PLT 320.0 04/16/2017    Lab Results   Component Value Date   CREATININE 0.79 03/14/2017   BUN 7 03/14/2017   NA 138 03/14/2017   K 3.7 03/14/2017   CL 102 03/14/2017   CO2 28 03/14/2017    Lab Results  Component Value Date   ALT 16 02/19/2017   AST 22 02/19/2017   ALKPHOS 58 02/19/2017   BILITOT 0.4 02/19/2017    Lab Results  Component Value Date   IRON 61 04/16/2017   TIBC 273 12/18/2008   FERRITIN 8.4 (L) 04/16/2017      Physical Exam: BP 122/72   Pulse 76   Ht 5\' 4"  (1.626 m)   Wt 227 lb (  103 kg)   LMP 12/27/2003   SpO2 94%   BMI 38.96 kg/m  Constitutional: Pleasant,well-developed, female in no acute distress. HEENT: Normocephalic and atraumatic. Conjunctivae are normal. No scleral icterus. Neck supple.  Cardiovascular: Normal rate, regular rhythm.  Pulmonary/chest: Effort normal and breath sounds normal. No wheezing, rales or rhonchi. Abdominal: Soft, protuberant, nontender. . There are no masses palpable. No hepatomegaly. Extremities: no edema Lymphadenopathy: No cervical adenopathy noted. Neurological: Alert and oriented to person place and time. Skin: Skin is warm and dry. No rashes noted. Psychiatric: Normal mood and affect. Behavior is normal.   ASSESSMENT AND PLAN: 42 year old female here for reassessment of the following issues:  Iron deficiency anemia - in the setting of hysterectomy, no clear etiology on EGD and colonoscopy. Referred for capsule study however this has not been done yet. She did not like the way IV iron infusions made her feel, Feraheme has risk of "fatigue" in 2% of patients reportedly who receive the infusion. Will recheck her CBC with iron studies again, and also send for hemoglobin electrophoresis to assess for thallasemia. If iron deficiency and anemia persists, and no other clear cause, she will consider capsule endoscopy. May consider another form of IV iron if she needs it. MRI did not show any concerning small bowel pathology.   Bloating - prior imaging  negative, reassured patient and discussed abdominal bloating / gas / distension with her. Will try her on a low FODMAP diet and gave her a sample of VSL#3 capsules, 1 capsule BID to see if this helps. If it does, she can purchase this OTC. Follow up as needed for that issue. She agreed.   Ileene Patrick, MD [facility] Pager [phone]

## 2017-06-04 LAB — HEMOGLOBINOPATHY EVALUATION
HGB C: 0 %
HGB S: 0 %
HGB VARIANT: 0 %
Hemoglobin A2 Quantitation: 2.1 % (ref 1.8–3.2)
Hemoglobin F Quantitation: 0 % (ref 0.0–2.0)
Hgb A: 97.9 % (ref 96.4–98.8)

## 2017-06-05 ENCOUNTER — Other Ambulatory Visit: Payer: Self-pay

## 2017-06-05 DIAGNOSIS — M2550 Pain in unspecified joint: Secondary | ICD-10-CM | POA: Diagnosis not present

## 2017-06-05 DIAGNOSIS — M542 Cervicalgia: Secondary | ICD-10-CM | POA: Diagnosis not present

## 2017-06-05 DIAGNOSIS — D509 Iron deficiency anemia, unspecified: Secondary | ICD-10-CM

## 2017-06-05 DIAGNOSIS — R768 Other specified abnormal immunological findings in serum: Secondary | ICD-10-CM | POA: Diagnosis not present

## 2017-06-05 DIAGNOSIS — R252 Cramp and spasm: Secondary | ICD-10-CM | POA: Diagnosis not present

## 2017-06-10 ENCOUNTER — Other Ambulatory Visit: Payer: Self-pay | Admitting: Family Medicine

## 2017-06-10 DIAGNOSIS — Z1231 Encounter for screening mammogram for malignant neoplasm of breast: Secondary | ICD-10-CM

## 2017-06-11 DIAGNOSIS — R079 Chest pain, unspecified: Secondary | ICD-10-CM | POA: Diagnosis not present

## 2017-06-11 DIAGNOSIS — Z711 Person with feared health complaint in whom no diagnosis is made: Secondary | ICD-10-CM | POA: Diagnosis not present

## 2017-06-11 MED FILL — ESOMEPRAZOLE MAGNESIUM 40 M: 40 | 30 days supply | Qty: 30 | Fill #2

## 2017-06-16 ENCOUNTER — Encounter: Payer: Self-pay | Admitting: Family Medicine

## 2017-06-16 ENCOUNTER — Ambulatory Visit (INDEPENDENT_AMBULATORY_CARE_PROVIDER_SITE_OTHER): Payer: BLUE CROSS/BLUE SHIELD | Admitting: Family Medicine

## 2017-06-16 VITALS — BP 122/90 | HR 100 | Temp 98.2°F | Resp 16 | Ht 64.0 in | Wt 224.0 lb

## 2017-06-16 DIAGNOSIS — M542 Cervicalgia: Secondary | ICD-10-CM | POA: Diagnosis not present

## 2017-06-16 DIAGNOSIS — Z131 Encounter for screening for diabetes mellitus: Secondary | ICD-10-CM | POA: Diagnosis not present

## 2017-06-16 DIAGNOSIS — R21 Rash and other nonspecific skin eruption: Secondary | ICD-10-CM

## 2017-06-16 LAB — POCT URINALYSIS DIP (DEVICE)
Bilirubin Urine: NEGATIVE
Glucose, UA: NEGATIVE mg/dL
Hgb urine dipstick: NEGATIVE
Ketones, ur: NEGATIVE mg/dL
Leukocytes, UA: NEGATIVE
Nitrite: NEGATIVE
Protein, ur: NEGATIVE mg/dL
Specific Gravity, Urine: <=1.005 (ref 1.005–1.030)
Urobilinogen, UA: 0.2 mg/dL (ref 0.0–1.0)
pH: 6.5 (ref 5.0–8.0)

## 2017-06-16 LAB — POCT GLYCOSYLATED HEMOGLOBIN (HGB A1C): Hemoglobin A1C: 5.1

## 2017-06-16 MED ORDER — CYCLOBENZAPRINE HCL 10 MG PO TABS: 10.0000 mg | ORAL_TABLET | Freq: Three times a day (TID) | ORAL | 0 refills | Status: DC | PRN

## 2017-06-16 NOTE — Progress Notes (Signed)
Patient ID: [name], female    DOB: [birth year], 42 y.o.   MRN: [id number]  PCP: Bing Neighbors, FNP  Chief Complaint  Patient presents with  . Follow-up    ED visit     Subjective:  HPI [name] is a 42 y.o. female with somatization disorder, migraines headaches, allodynia presents for follow-up of recent ED visit  In which she was evaluate for insect bite. Reports she developed a rash to her forearm and noticed an insect on her pillow. She is uncertain of the type of insect she saw, however she is concern for lyme disease. She has no rash present today although reports on-going itching. She has previously been prescribed triamcinolone cream which she has not attempted to recently.  He also complains today of neck stiffness and pain with rotational movements of her neck.  She has recently been diagnosed with fibromyalgia.  She reports the neck stiffness has been rather consistent over the course of the week.  She has not attempted relief with any medication or warm compresses.  She denies any recent injury to her neck or shoulder and notes that she apparently woke up and it was stiff and has progressively become more painful. Social History   Socioeconomic History  . Marital status: Divorced    Spouse name: Not on file  . Number of children: 2  . Years of education: 12th  . Highest education level: Not on file  Social Needs  . Financial resource strain: Not on file  . Food insecurity - worry: Not on file  . Food insecurity - inability: Not on file  . Transportation needs - medical: Not on file  . Transportation needs - non-medical: Not on file  Occupational History    Employer: OTHER    Comment: [employer]  Tobacco Use  . Smoking status: Never Smoker  . Smokeless tobacco: Never Used  Substance and Sexual Activity  . Alcohol use: Yes    Comment: occasional   . Drug use: No  . Sexual activity: Not on file  Other Topics Concern  . Not on file  Social History  Narrative   Patient lives at home with family.   Caffeine Use: 1 cup daily    Family History  Problem Relation Age of Onset  . Diabetes Mother   . Hypertension Mother   . Thyroid disease Sister   . Heart attack Maternal Grandmother   . Stroke Maternal Grandfather   . Colon cancer Neg Hx   . Colon polyps Neg Hx   . Esophageal cancer Neg Hx   . Rectal cancer Neg Hx   . Stomach cancer Neg Hx    Review of Systems  Constitutional: Positive for fatigue.  Respiratory: Negative.   Cardiovascular: Negative.   Musculoskeletal: Positive for arthralgias and neck stiffness.  Skin:       Rash resolved-itching occurring occasionally    Patient Active Problem List   Diagnosis Date Noted  . ANA positive 12/30/2016  . Gastroesophageal reflux disease 06/27/2016  . Disturbance of skin sensation 04/27/2014  . Dizziness and giddiness 03/14/2014  . Headache, migraine 03/14/2014  . Myalgia and myositis, unspecified 06/07/2013  . Nausea and vomiting in adult 08/22/2012  . Viral syndrome 08/22/2012  . Pelvic pain in female 12/18/2010  . PID (acute pelvic inflammatory disease) 12/18/2010  . ANEMIA-IRON DEFICIENCY 10/15/2006  . HERPES, GENITAL NOS 09/30/2006  . OBESITY NOS 09/30/2006  . DISORDER, NONORGANIC SLEEP NOS 09/30/2006  . CONSTIPATION NOS  09/30/2006  . NEURITIS, LUMBOSACRAL NOS 09/30/2006  . HX, PERSONAL, MENTAL DISORDER NOS 09/30/2006    No Known Allergies  Prior to Admission medications   Medication Sig Start Date End Date Taking? Authorizing Provider  acetaminophen (TYLENOL) 500 MG tablet Take 1 tablet (500 mg total) by mouth every 6 (six) hours as needed for mild pain (pain). Patient taking differently: Take 500 mg by mouth every 6 (six) hours as needed (for pain).  07/24/16  Yes Massie MaroonHollis, Lachina M, FNP  albuterol (PROVENTIL HFA;VENTOLIN HFA) 108 (90 Base) MCG/ACT inhaler Inhale 1-2 puffs into the lungs every 6 (six) hours as needed for wheezing or shortness of breath  (cough). Patient taking differently: Inhale 1-2 puffs into the lungs every 6 (six) hours as needed (cough, shortness of breath, or wheezing).  12/05/16  Yes Bing NeighborsHarris, Tannia Contino S, FNP  cetirizine (ZYRTEC) 10 MG tablet Take 1 tablet (10 mg total) by mouth daily. 07/24/16  Yes Massie MaroonHollis, Lachina M, FNP  esomeprazole (NEXIUM) 40 MG capsule TAKE 1 CAPSULE BY MOUTH EVERY MORNING Patient taking differently: Take 40 mg by mouth in the morning 02/05/17  Yes Bing NeighborsHarris, Chakara Bognar S, FNP  ferrous sulfate 325 (65 FE) MG EC tablet Take 1 tablet (325 mg total) by mouth 2 (two) times daily with a meal. 02/05/17  Yes Armbruster, Willaim RayasSteven P, MD  triamcinolone cream (KENALOG) 0.1 % Apply 1 application topically 2 (two) times daily. Patient taking differently: Apply 1 application topically 2 (two) times daily as needed (for irritation on face).  12/30/16  Yes Bing NeighborsHarris, Britlyn Martine S, FNP  zolpidem (AMBIEN) 5 MG tablet Take 1 tablet (5 mg total) by mouth at bedtime as needed. for sleep 05/23/17  Yes Bing NeighborsHarris, Donatella Walski S, FNP  cyclobenzaprine (FLEXERIL) 10 MG tablet Take 1 tablet (10 mg total) by mouth 3 (three) times daily as needed for muscle spasms. 06/16/17   Bing NeighborsHarris, Nusaybah Ivie S, FNP    Past Medical, Surgical Family and Social History reviewed and updated.    Objective:   Today's Vitals   06/16/17 1139  BP: 122/90  Pulse: 100  Resp: 16  Temp: 98.2 F (36.8 C)  TempSrc: Oral  SpO2: 100%  Weight: 224 lb (101.6 kg)  Height: 5\' 4"  (1.626 m)    Wt Readings from Last 3 Encounters:  06/16/17 224 lb (101.6 kg)  06/02/17 227 lb (103 kg)  05/27/17 231 lb (104.8 kg)   Physical Exam  Constitutional: She is oriented to person, place, and time. She appears well-developed and well-nourished.  HENT:  Head: Normocephalic and atraumatic.  Neck: Neck supple. Muscular tenderness present.  Cardiovascular: Normal rate, regular rhythm, normal heart sounds and intact distal pulses.  Pulmonary/Chest: Effort normal and breath sounds normal.   Musculoskeletal:       Cervical back: She exhibits decreased range of motion and tenderness.  Lymphadenopathy:    She has no cervical adenopathy.  Neurological: She is alert and oriented to person, place, and time.  Skin: Skin is warm and dry.  Psychiatric: She has a normal mood and affect. Her behavior is normal. Judgment and thought content normal.   Assessment & Plan:  1. Neck pain, idiopathic, start cyclobenzaprine 10 mg, up 3 times daily as needed for neck stiffness.  Apply warm compresses as needed to assist with inflammation.  2. Rash, resolved. Reassurance provided to patient that prior rash has resolved and she is asymptomatic of symptoms of lyme disease and there no evidence of any rash or lesion.  3. Screening for diabetes mellitus, (Hb  A1C)-5.1. Normal, repeat in 12 months.   Meds ordered this encounter  Medications  . cyclobenzaprine (FLEXERIL) 10 MG tablet    Sig: Take 1 tablet (10 mg total) by mouth 3 (three) times daily as needed for muscle spasms.    Dispense:  20 tablet    Refill:  0     Return for care if symptoms do not improve or as needed.   Godfrey Pick. Tiburcio Pea, MSN, FNP-C [facility]  [address] [place], [place] [address] [phone]

## 2017-06-16 NOTE — Patient Instructions (Addendum)
Apply warm compresses to neck for pain, and take cyclobenzaprine for pain three times a day as needed for stiffnessPlace neck pain patient instructions here.    Cervical Radiculopathy Cervical radiculopathy means that a nerve in the neck is pinched or bruised. This can cause pain or loss of feeling (numbness) that runs from your neck to your arm and fingers. Follow these instructions at home: Managing pain  Take over-the-counter and prescription medicines only as told by your doctor.  If directed, put ice on the injured or painful area. ? Put ice in a plastic bag. ? Place a towel between your skin and the bag. ? Leave the ice on for 20 minutes, 2-3 times per day.  If ice does not help, you can try using heat. Take a warm shower or warm bath, or use a heat pack as told by your doctor.  You may try a gentle neck and shoulder massage. Activity  Rest as needed. Follow instructions from your doctor about any activities to avoid.  Do exercises as told by your doctor or physical therapist. General instructions  If you were given a soft collar, wear it as told by your doctor.  Use a flat pillow when you sleep.  Keep all follow-up visits as told by your doctor. This is important. Contact a doctor if:  Your condition does not improve with treatment. Get help right away if:  Your pain gets worse and is not controlled with medicine.  You lose feeling or feel weak in your hand, arm, face, or leg.  You have a fever.  You have a stiff neck.  You cannot control when you poop or pee (have incontinence).  You have trouble with walking, balance, or talking. This information is not intended to replace advice given to you by your health care provider. Make sure you discuss any questions you have with your health care provider. Document Released: 04/11/2011 Document Revised: 09/28/2015 Document Reviewed: 06/16/2014 Elsevier Interactive Patient Education  [employer]2018 [employer] Inc[employer]

## 2017-06-23 ENCOUNTER — Ambulatory Visit (HOSPITAL_COMMUNITY)
Admission: RE | Admit: 2017-06-23 | Discharge: 2017-06-23 | Disposition: A | Discharge status: Home / Self Care | Payer: BLUE CROSS/BLUE SHIELD | Source: Ambulatory Visit | Attending: Gastroenterology | Admitting: Gastroenterology

## 2017-06-23 ENCOUNTER — Encounter (HOSPITAL_COMMUNITY)
Admission: RE | Disposition: A | Discharge status: Home / Self Care | Payer: Self-pay | Source: Ambulatory Visit | Attending: Gastroenterology

## 2017-06-23 ENCOUNTER — Encounter: Payer: Self-pay | Admitting: Gastroenterology

## 2017-06-23 DIAGNOSIS — D509 Iron deficiency anemia, unspecified: Secondary | ICD-10-CM

## 2017-06-23 HISTORY — PX: GIVENS CAPSULE STUDY: SHX5432

## 2017-06-23 SURGERY — IMAGING PROCEDURE, GI TRACT, INTRALUMINAL, VIA CAPSULE
Anesthesia: LOCAL

## 2017-06-23 SURGICAL SUPPLY — 1 items: TOWEL COTTON PACK 4EA (MISCELLANEOUS) ×4 IMPLANT

## 2017-06-23 NOTE — Progress Notes (Signed)
Pt ingested pill camera at 0825.  Instructions given to patient, patient verbalized understanding.  Roselie AwkwardShannon Love, RN

## 2017-06-24 ENCOUNTER — Encounter (HOSPITAL_COMMUNITY): Payer: Self-pay | Admitting: Gastroenterology

## 2017-06-24 ENCOUNTER — Other Ambulatory Visit: Payer: Self-pay | Admitting: Family Medicine

## 2017-06-25 MED ORDER — ZOLPIDEM TARTRATE 5 MG PO TABS: 5.0000 mg | ORAL_TABLET | Freq: Every evening | ORAL | 0 refills | Status: DC | PRN

## 2017-06-30 ENCOUNTER — Ambulatory Visit: Payer: BLUE CROSS/BLUE SHIELD

## 2017-07-01 ENCOUNTER — Telehealth: Payer: Self-pay | Admitting: Cardiovascular Disease

## 2017-07-01 DIAGNOSIS — M542 Cervicalgia: Secondary | ICD-10-CM | POA: Diagnosis not present

## 2017-07-01 DIAGNOSIS — N3941 Urge incontinence: Secondary | ICD-10-CM | POA: Diagnosis not present

## 2017-07-01 DIAGNOSIS — N3020 Other chronic cystitis without hematuria: Secondary | ICD-10-CM | POA: Diagnosis not present

## 2017-07-01 DIAGNOSIS — M25511 Pain in right shoulder: Secondary | ICD-10-CM | POA: Diagnosis not present

## 2017-07-01 NOTE — Telephone Encounter (Signed)
New Message   Pt c/o of Chest Pain: STAT if CP now or developed within 24 hours  1. Are you having CP right now? Yes  2. Are you experiencing any other symptoms (ex. SOB, nausea, vomiting, sweating)? SOB and sweating   3. How long have you been experiencing CP? For a few weeks and its tender to the touch   4. Is your CP continuous or coming and going? continous   5. Have you taken Nitroglycerin? No     Patient did schedule an appointment to come in on 07/04/2017.  ?

## 2017-07-01 NOTE — Telephone Encounter (Signed)
Returned call to patient of Dr. Duke Salviaandolph. She has tenderness and soreness in center of her chest - sore to touch. Symptoms have been going on a couple of weeks. She thinks this is coming from her throat being sore, throat on the side is swelling up, this wakes her up from sleep as she has SOB and heart is racing. She has taken tylenol and ibuprofen with no relief. She had a normal ETT Sept 2018.   Explained that symptoms do not sound cardiac in origin and reiterated that she had a normal stress test in the fall. Advised she keep her 3/1 appt and that acute chest pain is best evaluated in the ED. If she has chest pain lasting >30 mins or pain that increases in intensity or frequency, she should go on to hospital to be assessed. Advised would route to MD for any other recommendations.

## 2017-07-02 DIAGNOSIS — M2550 Pain in unspecified joint: Secondary | ICD-10-CM | POA: Diagnosis not present

## 2017-07-02 DIAGNOSIS — M542 Cervicalgia: Secondary | ICD-10-CM | POA: Diagnosis not present

## 2017-07-02 DIAGNOSIS — M3500 Sicca syndrome, unspecified: Secondary | ICD-10-CM | POA: Diagnosis not present

## 2017-07-02 DIAGNOSIS — R768 Other specified abnormal immunological findings in serum: Secondary | ICD-10-CM | POA: Diagnosis not present

## 2017-07-02 NOTE — Telephone Encounter (Signed)
Advised patient

## 2017-07-02 NOTE — Telephone Encounter (Signed)
Agree that this does not sound cardiac.  Keep follow up as scheduled but if she is feeling very poorly then go to ED.

## 2017-07-04 ENCOUNTER — Ambulatory Visit: Payer: BLUE CROSS/BLUE SHIELD | Admitting: Cardiovascular Disease

## 2017-07-07 ENCOUNTER — Telehealth: Payer: Self-pay | Admitting: Gastroenterology

## 2017-07-07 DIAGNOSIS — M25512 Pain in left shoulder: Secondary | ICD-10-CM | POA: Diagnosis not present

## 2017-07-07 DIAGNOSIS — M7581 Other shoulder lesions, right shoulder: Secondary | ICD-10-CM | POA: Diagnosis not present

## 2017-07-07 DIAGNOSIS — M6281 Muscle weakness (generalized): Secondary | ICD-10-CM | POA: Diagnosis not present

## 2017-07-07 NOTE — Telephone Encounter (Signed)
Pt calling for capsule endo results, please advise. 

## 2017-07-07 NOTE — Telephone Encounter (Signed)
Capsule done. There has been a slight delay in relaying results due to it being done at the hospital. We attempted to log in to get the report today but someone else was using the system. Will try again tomorrow. Bonita QuinLinda can you let the patient know I will contact her in the next 1-2 days as soon as I get the report. thanks

## 2017-07-08 ENCOUNTER — Telehealth: Payer: Self-pay | Admitting: Gastroenterology

## 2017-07-08 DIAGNOSIS — D509 Iron deficiency anemia, unspecified: Secondary | ICD-10-CM

## 2017-07-08 MED FILL — ESOMEPRAZOLE MAGNESIUM 40 M: 40 | 30 days supply | Qty: 30 | Fill #3

## 2017-07-08 NOTE — Telephone Encounter (Signed)
Spoke with pt and she is aware.

## 2017-07-08 NOTE — Telephone Encounter (Signed)
Left message for pt to call back  °

## 2017-07-08 NOTE — Telephone Encounter (Signed)
  Capsule endoscopy report done: - complete study with good prep. No clear pathology noted to cause her iron deficiency on this exam.  - I would continue to monitor CBC and continue iron supplementation.    I called the patient to relay the results, no answer, will call back

## 2017-07-09 DIAGNOSIS — M79605 Pain in left leg: Secondary | ICD-10-CM | POA: Diagnosis not present

## 2017-07-09 DIAGNOSIS — M79604 Pain in right leg: Secondary | ICD-10-CM | POA: Diagnosis not present

## 2017-07-09 NOTE — Telephone Encounter (Signed)
Called patient again. No pathology on capsule endoscopy. No pathology noted on EGD / colonoscopy to cause this, and small bowel looked okay on MRI as well. She felt okay after getting IV iron (her anemia improved) but thinks the infusion itself made her fatigue. I recommend she resume oral iron and repeat a CBC in 1-2 weeks to ensure Hgb stable. We will continue to monitor this. She agreed.

## 2017-07-10 DIAGNOSIS — M6281 Muscle weakness (generalized): Secondary | ICD-10-CM | POA: Diagnosis not present

## 2017-07-10 DIAGNOSIS — M7581 Other shoulder lesions, right shoulder: Secondary | ICD-10-CM | POA: Diagnosis not present

## 2017-07-10 DIAGNOSIS — M25512 Pain in left shoulder: Secondary | ICD-10-CM | POA: Diagnosis not present

## 2017-07-14 ENCOUNTER — Ambulatory Visit (INDEPENDENT_AMBULATORY_CARE_PROVIDER_SITE_OTHER): Payer: BLUE CROSS/BLUE SHIELD | Admitting: Family Medicine

## 2017-07-14 ENCOUNTER — Encounter: Payer: Self-pay | Admitting: Family Medicine

## 2017-07-14 VITALS — BP 130/74 | HR 66 | Temp 98.4°F | Ht 64.0 in | Wt 219.4 lb

## 2017-07-14 DIAGNOSIS — Z1389 Encounter for screening for other disorder: Secondary | ICD-10-CM

## 2017-07-14 DIAGNOSIS — R5383 Other fatigue: Secondary | ICD-10-CM | POA: Diagnosis not present

## 2017-07-14 DIAGNOSIS — R05 Cough: Secondary | ICD-10-CM

## 2017-07-14 DIAGNOSIS — E611 Iron deficiency: Secondary | ICD-10-CM

## 2017-07-14 DIAGNOSIS — R059 Cough, unspecified: Secondary | ICD-10-CM

## 2017-07-14 LAB — POCT URINALYSIS DIP (DEVICE)
Bilirubin Urine: NEGATIVE
Glucose, UA: NEGATIVE mg/dL
Hgb urine dipstick: NEGATIVE
Ketones, ur: NEGATIVE mg/dL
Leukocytes, UA: NEGATIVE
Nitrite: NEGATIVE
Protein, ur: NEGATIVE mg/dL
Specific Gravity, Urine: 1.015 (ref 1.005–1.030)
Urobilinogen, UA: 0.2 mg/dL (ref 0.0–1.0)
pH: 6 (ref 5.0–8.0)

## 2017-07-14 MED ORDER — BENZONATATE 100 MG PO CAPS
100.0000 mg | ORAL_CAPSULE | Freq: Three times a day (TID) | ORAL | 0 refills | Status: DC | PRN
Start: 2017-07-14 — End: 2017-07-17

## 2017-07-14 NOTE — Patient Instructions (Signed)

## 2017-07-14 NOTE — Progress Notes (Signed)
Patient ID: [name], female    DOB: 1976-03-14, 42 y.o.   MRN: [id number]  PCP: [name]  Chief Complaint  Patient presents with  . URI    cough    Subjective:  HPI [name] is a 42 y.o. female with a history iron anemia, allodynia, chronic pain syndrome, presents for evaluation of URI symptoms. [name] complains of three days of coughing, fatigue, and congestion. Cough is the most worrisome symptom. Denies fever, body aches, sore throat, chills, or facial pressure. She has not attempted relief with any OTC medication. Reports no other complaints today. [name] was recently diagnosed with iron deficiency anemia during a GI visit. She has been taking oral iron replacement since that time. Social History   Socioeconomic History  . Marital status: Divorced    Spouse name: Not on file  . Number of children: 2  . Years of education: 12th  . Highest education level: Not on file  Social Needs  . Financial resource strain: Not on file  . Food insecurity - worry: Not on file  . Food insecurity - inability: Not on file  . Transportation needs - medical: Not on file  . Transportation needs - non-medical: Not on file  Occupational History    Employer: OTHER    Comment: [employer]  Tobacco Use  . Smoking status: Never Smoker  . Smokeless tobacco: Never Used  Substance and Sexual Activity  . Alcohol use: Yes    Comment: occasional   . Drug use: No  . Sexual activity: Not on file  Other Topics Concern  . Not on file  Social History Narrative   Patient lives at home with family.   Caffeine Use: 1 cup daily    Family History  Problem Relation Age of Onset  . Diabetes Mother   . Hypertension Mother   . Thyroid disease Sister   . Heart attack Maternal Grandmother   . Stroke Maternal Grandfather   . Colon cancer Neg Hx   . Colon polyps Neg Hx   . Esophageal cancer Neg Hx   . Rectal cancer Neg Hx   . Stomach cancer Neg Hx    Review of  Systems Pertinent negatives indicated in HPI Patient Active Problem List   Diagnosis Date Noted  . ANA positive 12/30/2016  . Gastroesophageal reflux disease 06/27/2016  . Disturbance of skin sensation 04/27/2014  . Dizziness and giddiness 03/14/2014  . Headache, migraine 03/14/2014  . Myalgia and myositis, unspecified 06/07/2013  . Nausea and vomiting in adult 08/22/2012  . Viral syndrome 08/22/2012  . Pelvic pain in female 12/18/2010  . PID (acute pelvic inflammatory disease) 12/18/2010  . ANEMIA-IRON DEFICIENCY 10/15/2006  . HERPES, GENITAL NOS 09/30/2006  . OBESITY NOS 09/30/2006  . DISORDER, NONORGANIC SLEEP NOS 09/30/2006  . CONSTIPATION NOS 09/30/2006  . NEURITIS, LUMBOSACRAL NOS 09/30/2006  . HX, PERSONAL, MENTAL DISORDER NOS 09/30/2006    No Known Allergies  Prior to Admission medications   Medication Sig Start Date End Date Taking? Authorizing Provider  esomeprazole (NEXIUM) 40 MG capsule TAKE 1 CAPSULE BY MOUTH EVERY MORNING Patient taking differently: Take 40 mg by mouth in the morning 02/05/17  Yes Sheila NeighborsHarris, Sheila Allende S, [name]  zolpidem (AMBIEN) 5 MG tablet Take 1 tablet (5 mg total) by mouth at bedtime as needed. for sleep 06/25/17  Yes Sheila NeighborsHarris, Sheila Dungee S, [name]  acetaminophen (TYLENOL) 500 MG tablet Take 1 tablet (500 mg total) by mouth every 6 (six) hours as  needed for mild pain (pain). Patient not taking: Reported on 07/14/2017 07/24/16   Massie Maroon, [name]  albuterol (PROVENTIL HFA;VENTOLIN HFA) 108 (90 Base) MCG/ACT inhaler Inhale 1-2 puffs into the lungs every 6 (six) hours as needed for wheezing or shortness of breath (cough). Patient not taking: Reported on 07/14/2017 12/05/16   Sheila Neighbors, [name]  cetirizine (ZYRTEC) 10 MG tablet Take 1 tablet (10 mg total) by mouth daily. Patient not taking: Reported on 07/14/2017 07/24/16   Massie Maroon, [name]  cyclobenzaprine (FLEXERIL) 10 MG tablet Take 1 tablet (10 mg total) by mouth 3 (three) times daily as needed for  muscle spasms. Patient not taking: Reported on 07/14/2017 06/16/17   Sheila Neighbors, [name]  ferrous sulfate 325 (65 FE) MG EC tablet Take 1 tablet (325 mg total) by mouth 2 (two) times daily with a meal. Patient not taking: Reported on 07/14/2017 02/05/17   Benancio Deeds, MD  triamcinolone cream (KENALOG) 0.1 % Apply 1 application topically 2 (two) times daily. Patient not taking: Reported on 07/14/2017 12/30/16   Sheila Neighbors, [name]    Past Medical, Surgical Family and Social History reviewed and updated.    Objective:   Today's Vitals   07/14/17 1419  BP: 140/82  Pulse: 66  Temp: 98.4 F (36.9 C)  TempSrc: Oral  SpO2: 100%  Weight: 219 lb 6.4 oz (99.5 kg)  Height: 5\' 4"  (1.626 m)    Wt Readings from Last 3 Encounters:  07/14/17 219 lb 6.4 oz (99.5 kg)  06/23/17 215 lb (97.5 kg)  06/16/17 224 lb (101.6 kg)   Physical Exam  Constitutional: Patient appears well-developed and well-nourished. No distress. HENT: Normocephalic, atraumatic, External right and left ear normal. Oropharynx is clear and moist.  Eyes: Conjunctivae and EOM are normal. PERRLA, no scleral icterus. Neck: Normal ROM. Neck supple. No JVD. No tracheal deviation. No thyromegaly. CVS: RRR, S1/S2 +, no murmurs, no gallops, no carotid bruit.  Pulmonary: Effort and breath sounds normal, no stridor, rhonchi, wheezes, rales.  Musculoskeletal: Normal range of motion. No edema and no tenderness.  Lymphadenopathy: No lymphadenopathy noted, cervical. Neuro: Alert. Normal reflexes, muscle tone coordination. No cranial nerve deficit. Skin: Skin is warm and dry. No rash noted. Not diaphoretic. No erythema. No pallor.   Assessment & Plan:  1. Iron deficiency 2. Other fatigue - will check CBC, CMP, and iron panel to evaluated for anemia and low iron level. Continue oral iron replacement. 3. Cough, likely secondary to URI. Will treat symptomatically for now. Increase hydration and rest.  Start benzonatate 100 mg up  to three times daily as needed for cough.  RTC: 6 months for chronic conditions follow-up.    Godfrey Pick. Tiburcio Pea, MSN, [name]-C [facility]  [address] [place], [place] [address] [phone]

## 2017-07-15 LAB — CBC WITH DIFFERENTIAL/PLATELET
Basophils Absolute: 0 10*3/uL (ref 0.0–0.2)
Basos: 1 %
EOS (ABSOLUTE): 0.1 10*3/uL (ref 0.0–0.4)
Eos: 1 %
Hematocrit: 37.2 % (ref 34.0–46.6)
Hemoglobin: 12.1 g/dL (ref 11.1–15.9)
Immature Grans (Abs): 0 10*3/uL (ref 0.0–0.1)
Immature Granulocytes: 0 %
Lymphocytes Absolute: 2 10*3/uL (ref 0.7–3.1)
Lymphs: 38 %
MCH: 25.2 pg — ABNORMAL LOW (ref 26.6–33.0)
MCHC: 32.5 g/dL (ref 31.5–35.7)
MCV: 77 fL — ABNORMAL LOW (ref 79–97)
Monocytes Absolute: 0.4 10*3/uL (ref 0.1–0.9)
Monocytes: 7 %
Neutrophils Absolute: 2.8 10*3/uL (ref 1.4–7.0)
Neutrophils: 53 %
Platelets: 340 10*3/uL (ref 150–379)
RBC: 4.81 x10E6/uL (ref 3.77–5.28)
RDW: 16 % — ABNORMAL HIGH (ref 12.3–15.4)
WBC: 5.3 10*3/uL (ref 3.4–10.8)

## 2017-07-15 LAB — COMPREHENSIVE METABOLIC PANEL
ALT: 18 IU/L (ref 0–32)
AST: 20 IU/L (ref 0–40)
Albumin/Globulin Ratio: 1.3 (ref 1.2–2.2)
Albumin: 4.2 g/dL (ref 3.5–5.5)
Alkaline Phosphatase: 74 IU/L (ref 39–117)
BUN/Creatinine Ratio: 7 — ABNORMAL LOW (ref 9–23)
BUN: 6 mg/dL (ref 6–24)
Bilirubin Total: 0.3 mg/dL (ref 0.0–1.2)
CO2: 26 mmol/L (ref 20–29)
Calcium: 9.4 mg/dL (ref 8.7–10.2)
Chloride: 102 mmol/L (ref 96–106)
Creatinine, Ser: 0.86 mg/dL (ref 0.57–1.00)
GFR calc Af Amer: 97 mL/min/{1.73_m2} (ref >59)
GFR calc non Af Amer: 84 mL/min/{1.73_m2} (ref >59)
Globulin, Total: 3.3 g/dL (ref 1.5–4.5)
Glucose: 82 mg/dL (ref 65–99)
Potassium: 4.2 mmol/L (ref 3.5–5.2)
Sodium: 141 mmol/L (ref 134–144)
Total Protein: 7.5 g/dL (ref 6.0–8.5)

## 2017-07-15 LAB — IRON,TIBC AND FERRITIN PANEL
Ferritin: 209 ng/mL — ABNORMAL HIGH (ref 15–150)
Iron Saturation: 25 % (ref 15–55)
Iron: 82 ug/dL (ref 27–159)
Total Iron Binding Capacity: 324 ug/dL (ref 250–450)
UIBC: 242 ug/dL (ref 131–425)

## 2017-07-16 DIAGNOSIS — M7581 Other shoulder lesions, right shoulder: Secondary | ICD-10-CM | POA: Diagnosis not present

## 2017-07-16 DIAGNOSIS — M25512 Pain in left shoulder: Secondary | ICD-10-CM | POA: Diagnosis not present

## 2017-07-16 DIAGNOSIS — M6281 Muscle weakness (generalized): Secondary | ICD-10-CM | POA: Diagnosis not present

## 2017-07-17 ENCOUNTER — Encounter (HOSPITAL_COMMUNITY): Payer: Self-pay | Admitting: Emergency Medicine

## 2017-07-17 ENCOUNTER — Other Ambulatory Visit: Payer: Self-pay

## 2017-07-17 ENCOUNTER — Ambulatory Visit (HOSPITAL_COMMUNITY)
Admission: EM | Admit: 2017-07-17 | Discharge: 2017-07-17 | Disposition: A | Discharge status: Home / Self Care | Payer: BLUE CROSS/BLUE SHIELD | Attending: Internal Medicine | Admitting: Internal Medicine

## 2017-07-17 DIAGNOSIS — J22 Unspecified acute lower respiratory infection: Secondary | ICD-10-CM | POA: Diagnosis not present

## 2017-07-17 DIAGNOSIS — R110 Nausea: Secondary | ICD-10-CM

## 2017-07-17 DIAGNOSIS — R05 Cough: Secondary | ICD-10-CM

## 2017-07-17 DIAGNOSIS — M791 Myalgia, unspecified site: Secondary | ICD-10-CM | POA: Diagnosis not present

## 2017-07-17 MED ORDER — DM-GUAIFENESIN ER 30-600 MG PO TB12: 1.0000 | ORAL_TABLET | Freq: Two times a day (BID) | ORAL | 0 refills | Status: DC

## 2017-07-17 MED ORDER — AZITHROMYCIN 250 MG PO TABS: ORAL_TABLET | ORAL | 0 refills | Status: AC

## 2017-07-17 MED ORDER — BENZONATATE 100 MG PO CAPS: 200.0000 mg | ORAL_CAPSULE | Freq: Three times a day (TID) | ORAL | 0 refills | Status: DC

## 2017-07-17 NOTE — ED Triage Notes (Signed)
Reports a cough for a week.  Taking otc medicines.  Cough is worse particularly at night.  Reports green/yellow phlegm production.  Patient noticed blood specks in phlegm last night.

## 2017-07-17 NOTE — Discharge Instructions (Signed)
Push fluids to ensure adequate hydration and keep secretions thin.  Tylenol and/or ibuprofen as needed for pain or fevers.  Complete course of antibiotics.  May use nasal saline to promote moisture. Mucinex as an expectorant may be helpful. If symptoms worsen or do not improve in the next week to return to be seen or to follow up with PCP.

## 2017-07-17 NOTE — ED Provider Notes (Signed)
[facility]    CSN: [id number] Arrival date & time: 07/17/17  1052     History   Chief Complaint Chief Complaint  Patient presents with  . Cough    HPI [name] is a 42 y.o. female.   Devin presents with complaints of worsening cough which started 1 week ago. Poor sleep. Deep breathing causes cough which causes chest pain. Cough is productive of green phlegm. No known fevers but has been sweating. Body aches. Mild nausea. Loose stools. Congestion. Throat feels dry. Right ear pain. Has been taking tylenol and ibuprofen which have minimally helped with symptoms. No known ill contacts. No history of asthma. Hx of allergies, anemia, gerd, pid.     ROS per HPI.       Past Medical History:  Diagnosis Date  . Allergy   . Anemia   . Anxiety   . Arthritis   . Constipation   . GERD (gastroesophageal reflux disease)   . Hx of endometriosis   . Macromastia 07/2011  . Migraine     Patient Active Problem List   Diagnosis Date Noted  . ANA positive 12/30/2016  . Gastroesophageal reflux disease 06/27/2016  . Disturbance of skin sensation 04/27/2014  . Dizziness and giddiness 03/14/2014  . Headache, migraine 03/14/2014  . Myalgia and myositis, unspecified 06/07/2013  . Nausea and vomiting in adult 08/22/2012  . Viral syndrome 08/22/2012  . Pelvic pain in female 12/18/2010  . PID (acute pelvic inflammatory disease) 12/18/2010  . ANEMIA-IRON DEFICIENCY 10/15/2006  . HERPES, GENITAL NOS 09/30/2006  . OBESITY NOS 09/30/2006  . DISORDER, NONORGANIC SLEEP NOS 09/30/2006  . CONSTIPATION NOS 09/30/2006  . NEURITIS, LUMBOSACRAL NOS 09/30/2006  . HX, PERSONAL, MENTAL DISORDER NOS 09/30/2006    Past Surgical History:  Procedure Laterality Date  . ABDOMINAL HYSTERECTOMY  12/12/2003  . ADENOIDECTOMY  05/2011  . ANKLE ARTHROSCOPY  12/29/2007   right; with extensive debridement  . BLADDER NECK RECONSTRUCTION  01/14/2011   Procedure: BLADDER NECK REPAIR;  Surgeon:  Reva Bores, MD;  Location: [facility];  Service: Gynecology;  Laterality: N/A;  Laparoscopic Repair of Incidental Cystotomy  . BREAST REDUCTION SURGERY  08/05/2011   Procedure: MAMMARY REDUCTION  (BREAST);  Surgeon: Louisa Second, MD;  Location:  S[facility]ERY [facility];  Service: Plastics;  Laterality: Bilateral;  bilateral  . CESAREAN SECTION  12/29/2000; 1994  . DILATION AND CURETTAGE OF UTERUS  07/08/2003   open laparoscopy  . GIVENS CAPSULE STUDY N/A 06/23/2017   Procedure: GIVENS CAPSULE STUDY;  Surgeon: Benancio Deeds, MD;  Location: [facility];  Service: Gastroenterology;  Laterality: N/A;  . LAPAROSCOPIC SALPINGOOPHERECTOMY  01/14/2011   left  . REPAIR PERONEAL TENDONS ANKLE  01/03/2010   repair right subluxing peroneal tendons  . RIGHT OOPHORECTOMY     with lysis of adhesions  . rt. shoulder surgery    . SALPINGECTOMY  12/12/2003   right; with TAH  . TONSILLECTOMY  as a child  . TUBAL LIGATION  12/29/2000    OB History    Gravida Para Term Preterm AB Living   2 2 2     2    SAB TAB Ectopic Multiple Live Births                   Home Medications    Prior to Admission medications   Medication Sig Start Date End Date Taking? Authorizing Provider  zolpidem (AMBIEN) 5 MG tablet Take 1 tablet (5 mg total) by mouth  at bedtime as needed. for sleep 06/25/17  Yes Bing Neighbors, FNP  azithromycin (ZITHROMAX) 250 MG tablet Take 2 tablets (500 mg total) by mouth daily for 1 day, THEN 1 tablet (250 mg total) daily for 4 days. 07/17/17 07/22/17  Georgetta Haber, NP  benzonatate (TESSALON) 100 MG capsule Take 2 capsules (200 mg total) by mouth every 8 (eight) hours. 07/17/17   Georgetta Haber, NP  esomeprazole (NEXIUM) 40 MG capsule TAKE 1 CAPSULE BY MOUTH EVERY MORNING Patient taking differently: Take 40 mg by mouth in the morning 02/05/17   Bing Neighbors, FNP  ferrous sulfate 325 (65 FE) MG EC tablet Take 1 tablet (325 mg total) by mouth 2 (two) times daily with a  meal. Patient not taking: Reported on 07/14/2017 02/05/17   Armbruster, Willaim Rayas, MD    Family History Family History  Problem Relation Age of Onset  . Diabetes Mother   . Hypertension Mother   . Thyroid disease Sister   . Heart attack Maternal Grandmother   . Stroke Maternal Grandfather   . Colon cancer Neg Hx   . Colon polyps Neg Hx   . Esophageal cancer Neg Hx   . Rectal cancer Neg Hx   . Stomach cancer Neg Hx     Social History Social History   Tobacco Use  . Smoking status: Never Smoker  . Smokeless tobacco: Never Used  Substance Use Topics  . Alcohol use: Yes    Comment: occasional   . Drug use: No     Allergies   Patient has no known allergies.   Review of Systems Review of Systems   Physical Exam Triage Vital Signs ED Triage Vitals  Enc Vitals Group     BP 07/17/17 1120 (!) 145/84     Pulse Rate 07/17/17 1120 79     Resp 07/17/17 1120 (!) 24     Temp 07/17/17 1120 98.4 F (36.9 C)     Temp Source 07/17/17 1120 Oral     SpO2 07/17/17 1120 100 %     Weight --      Height --      Head Circumference --      Peak Flow --      Pain Score 07/17/17 1118 7     Pain Loc --      Pain Edu? --      Excl. in GC? --    No data found.  Updated Vital Signs BP (!) 145/84 (BP Location: Left Arm) Comment (BP Location): large cuff  Pulse 79   Temp 98.4 F (36.9 C) (Oral)   Resp (!) 24   LMP 12/27/2003   SpO2 100%   Visual Acuity Right Eye Distance:   Left Eye Distance:   Bilateral Distance:    Right Eye Near:   Left Eye Near:    Bilateral Near:     Physical Exam  Constitutional: She is oriented to person, place, and time. She appears well-developed and well-nourished. No distress.  HENT:  Head: Normocephalic and atraumatic.  Right Ear: Tympanic membrane, external ear and ear canal normal.  Left Ear: Tympanic membrane, external ear and ear canal normal.  Nose: Nose normal.  Mouth/Throat: Uvula is midline, oropharynx is clear and moist and mucous  membranes are normal. No tonsillar exudate.  Eyes: Conjunctivae and EOM are normal. Pupils are equal, round, and reactive to light.  Cardiovascular: Normal rate, regular rhythm and normal heart sounds.  Pulmonary/Chest: Effort normal. She has decreased  breath sounds in the right lower field and the left lower field.  Lymphadenopathy:    She has no cervical adenopathy.  Neurological: She is alert and oriented to person, place, and time.  Skin: Skin is warm and dry.     UC Treatments / Results  Labs (all labs ordered are listed, but only abnormal results are displayed) Labs Reviewed - No data to display  EKG  EKG Interpretation None       Radiology No results found.  Procedures Procedures (including critical care time)  Medications Ordered in UC Medications - No data to display   Initial Impression / Assessment and Plan / UC Course  I have reviewed the triage vital signs and the nursing notes.  Pertinent labs & imaging results that were available during my care of the patient were reviewed by me and considered in my medical decision making (see chart for details).     Worsening cough and URI symptoms for the past week. Empiric azithromycin provided at this time. Tessalon and mucinex d as needed for cough. Push fluids. If symptoms worsen or do not improve in the next week to return to be seen or to follow up with PCP.  Patient verbalized understanding and agreeable to plan.    Final Clinical Impressions(s) / UC Diagnoses   Final diagnoses:  Lower respiratory infection    ED Discharge Orders        Ordered    azithromycin (ZITHROMAX) 250 MG tablet     07/17/17 1156    benzonatate (TESSALON) 100 MG capsule  Every 8 hours     07/17/17 1156       Controlled Substance Prescriptions [place] Controlled Substance Registry consulted? Not Applicable   Georgetta HaberBurky, Natalie B, NP 07/17/17 1204

## 2017-07-18 DIAGNOSIS — M6281 Muscle weakness (generalized): Secondary | ICD-10-CM | POA: Diagnosis not present

## 2017-07-18 DIAGNOSIS — M7581 Other shoulder lesions, right shoulder: Secondary | ICD-10-CM | POA: Diagnosis not present

## 2017-07-18 DIAGNOSIS — M25511 Pain in right shoulder: Secondary | ICD-10-CM | POA: Diagnosis not present

## 2017-07-21 ENCOUNTER — Ambulatory Visit
Admission: RE | Admit: 2017-07-21 | Discharge: 2017-07-21 | Disposition: A | Discharge status: Home / Self Care | Payer: BLUE CROSS/BLUE SHIELD | Source: Ambulatory Visit | Attending: Family Medicine | Admitting: Family Medicine

## 2017-07-21 DIAGNOSIS — Z1231 Encounter for screening mammogram for malignant neoplasm of breast: Secondary | ICD-10-CM

## 2017-07-21 DIAGNOSIS — I8311 Varicose veins of right lower extremity with inflammation: Secondary | ICD-10-CM | POA: Diagnosis not present

## 2017-07-21 DIAGNOSIS — I8312 Varicose veins of left lower extremity with inflammation: Secondary | ICD-10-CM | POA: Diagnosis not present

## 2017-07-22 DIAGNOSIS — M25511 Pain in right shoulder: Secondary | ICD-10-CM | POA: Diagnosis not present

## 2017-07-22 DIAGNOSIS — M6281 Muscle weakness (generalized): Secondary | ICD-10-CM | POA: Diagnosis not present

## 2017-07-22 DIAGNOSIS — M7581 Other shoulder lesions, right shoulder: Secondary | ICD-10-CM | POA: Diagnosis not present

## 2017-07-23 ENCOUNTER — Other Ambulatory Visit: Payer: Self-pay | Admitting: Family Medicine

## 2017-07-23 MED ORDER — ZOLPIDEM TARTRATE 5 MG PO TABS: 5.0000 mg | ORAL_TABLET | Freq: Every evening | ORAL | 0 refills | Status: DC | PRN

## 2017-07-24 DIAGNOSIS — M7581 Other shoulder lesions, right shoulder: Secondary | ICD-10-CM | POA: Diagnosis not present

## 2017-07-24 DIAGNOSIS — M25511 Pain in right shoulder: Secondary | ICD-10-CM | POA: Diagnosis not present

## 2017-07-24 DIAGNOSIS — M6281 Muscle weakness (generalized): Secondary | ICD-10-CM | POA: Diagnosis not present

## 2017-07-29 DIAGNOSIS — M25511 Pain in right shoulder: Secondary | ICD-10-CM | POA: Diagnosis not present

## 2017-07-30 DIAGNOSIS — K219 Gastro-esophageal reflux disease without esophagitis: Secondary | ICD-10-CM | POA: Insufficient documentation

## 2017-07-30 DIAGNOSIS — J029 Acute pharyngitis, unspecified: Secondary | ICD-10-CM | POA: Diagnosis not present

## 2017-07-30 DIAGNOSIS — K210 Gastro-esophageal reflux disease with esophagitis: Secondary | ICD-10-CM | POA: Diagnosis not present

## 2017-07-31 ENCOUNTER — Encounter: Payer: Self-pay | Admitting: Family Medicine

## 2017-07-31 DIAGNOSIS — H40013 Open angle with borderline findings, low risk, bilateral: Secondary | ICD-10-CM | POA: Diagnosis not present

## 2017-07-31 DIAGNOSIS — H25013 Cortical age-related cataract, bilateral: Secondary | ICD-10-CM | POA: Diagnosis not present

## 2017-07-31 DIAGNOSIS — H40033 Anatomical narrow angle, bilateral: Secondary | ICD-10-CM | POA: Diagnosis not present

## 2017-07-31 DIAGNOSIS — H3589 Other specified retinal disorders: Secondary | ICD-10-CM | POA: Diagnosis not present

## 2017-08-04 DIAGNOSIS — M25511 Pain in right shoulder: Secondary | ICD-10-CM | POA: Diagnosis not present

## 2017-08-11 ENCOUNTER — Encounter: Payer: Self-pay | Admitting: Podiatry

## 2017-08-11 ENCOUNTER — Ambulatory Visit (INDEPENDENT_AMBULATORY_CARE_PROVIDER_SITE_OTHER): Payer: BLUE CROSS/BLUE SHIELD

## 2017-08-11 ENCOUNTER — Ambulatory Visit (INDEPENDENT_AMBULATORY_CARE_PROVIDER_SITE_OTHER): Payer: BLUE CROSS/BLUE SHIELD | Admitting: Podiatry

## 2017-08-11 VITALS — BP 125/74 | HR 68 | Resp 16

## 2017-08-11 DIAGNOSIS — Q742 Other congenital malformations of lower limb(s), including pelvic girdle: Secondary | ICD-10-CM

## 2017-08-11 DIAGNOSIS — M79674 Pain in right toe(s): Secondary | ICD-10-CM | POA: Diagnosis not present

## 2017-08-11 MED ORDER — MELOXICAM 7.5 MG PO TABS: 7.5000 mg | ORAL_TABLET | Freq: Every day | ORAL | 0 refills | Status: DC

## 2017-08-11 NOTE — Patient Instructions (Signed)
There is a small piece of extra bone in the bottom of your big toe that is likely causing the pain, especially when you drive. Try the offloading pads that I got for you and the mobic (anti-inflammatory). If not better in the next couple of weeks, please let me know. Have a great week!!!!  If was nice to meet you today. If you have any questions or any further concerns, please feel fee to give me a call. You can call our office at [phone] or please feel fee to send me a message through MyChart.

## 2017-08-11 NOTE — Progress Notes (Signed)
Subjective:   Patient ID: [name], female   DOB: 42 y.o.   MRN: [id number]   HPI 42 year old female presents the office today for concerns of pain to her right big toe which is been ongoing for the last couple of weeks.  She denies any recent injury or trauma but she noticed that when she drives for work is when she gets a lot of pain she describes a deep discomfort.  She has not had any significant swelling that she has noticed and she denies any drainage or pus coming from the toenail itself denies any pain to the actual nail.  She said no recent treatment for.  She has no other concerns.   Review of Systems  All other systems reviewed and are negative.   Past Medical History:  Diagnosis Date  . Allergy   . Anemia   . Anxiety   . Arthritis   . Constipation   . GERD (gastroesophageal reflux disease)   . Hx of endometriosis   . Macromastia 07/2011  . Migraine     Past Surgical History:  Procedure Laterality Date  . ABDOMINAL HYSTERECTOMY  12/12/2003  . ADENOIDECTOMY  05/2011  . ANKLE ARTHROSCOPY  12/29/2007   right; with extensive debridement  . BLADDER NECK RECONSTRUCTION  01/14/2011   Procedure: BLADDER NECK REPAIR;  Surgeon: Reva Bores, MD;  Location: [facility];  Service: Gynecology;  Laterality: N/A;  Laparoscopic Repair of Incidental Cystotomy  . BREAST REDUCTION SURGERY  08/05/2011   Procedure: MAMMARY REDUCTION  (BREAST);  Surgeon: Louisa Second, MD;  Location: [facility];  Service: Plastics;  Laterality: Bilateral;  bilateral  . CESAREAN SECTION  12/29/2000; 1994  . DILATION AND CURETTAGE OF UTERUS  07/08/2003   open laparoscopy  . GIVENS CAPSULE STUDY N/A 06/23/2017   Procedure: GIVENS CAPSULE STUDY;  Surgeon: Benancio Deeds, MD;  Location: [facility];  Service: Gastroenterology;  Laterality: N/A;  . LAPAROSCOPIC SALPINGOOPHERECTOMY  01/14/2011   left  . REPAIR PERONEAL TENDONS ANKLE  01/03/2010   repair right subluxing peroneal tendons  .  RIGHT OOPHORECTOMY     with lysis of adhesions  . rt. shoulder surgery    . SALPINGECTOMY  12/12/2003   right; with TAH  . TONSILLECTOMY  as a child  . TUBAL LIGATION  12/29/2000     Current Outpatient Medications:  .  benzonatate (TESSALON) 100 MG capsule, Take 2 capsules (200 mg total) by mouth every 8 (eight) hours., Disp: 21 capsule, Rfl: 0 .  dextromethorphan-guaiFENesin (MUCINEX DM) 30-600 MG 12hr tablet, Take 1 tablet by mouth 2 (two) times daily., Disp: 20 tablet, Rfl: 0 .  esomeprazole (NEXIUM) 40 MG capsule, TAKE 1 CAPSULE BY MOUTH EVERY MORNING (Patient taking differently: Take 40 mg by mouth in the morning), Disp: 90 capsule, Rfl: 1 .  ferrous sulfate 325 (65 FE) MG EC tablet, Take 1 tablet (325 mg total) by mouth 2 (two) times daily with a meal., Disp: 60 tablet, Rfl: 3 .  zolpidem (AMBIEN) 5 MG tablet, Take 1 tablet (5 mg total) by mouth at bedtime as needed. for sleep, Disp: 30 tablet, Rfl: 0 .  meloxicam (MOBIC) 7.5 MG tablet, Take 1 tablet (7.5 mg total) by mouth daily., Disp: 14 tablet, Rfl: 0  No Known Allergies  Social History   Socioeconomic History  . Marital status: Divorced    Spouse name: Not on file  . Number of children: 2  . Years of education: 12th  . Highest  education level: Not on file  Occupational History    Employer: OTHER    Comment: [employer]  Social Needs  . Financial resource strain: Not on file  . Food insecurity:    Worry: Not on file    Inability: Not on file  . Transportation needs:    Medical: Not on file    Non-medical: Not on file  Tobacco Use  . Smoking status: Never Smoker  . Smokeless tobacco: Never Used  Substance and Sexual Activity  . Alcohol use: Yes    Comment: occasional   . Drug use: No  . Sexual activity: Not on file  Lifestyle  . Physical activity:    Days per week: Not on file    Minutes per session: Not on file  . Stress: Not on file  Relationships  . Social connections:    Talks on phone: Not on file     Gets together: Not on file    Attends religious service: Not on file    Active member of club or organization: Not on file    Attends meetings of clubs or organizations: Not on file    Relationship status: Not on file  . Intimate partner violence:    Fear of current or ex partner: Not on file    Emotionally abused: Not on file    Physically abused: Not on file    Forced sexual activity: Not on file  Other Topics Concern  . Not on file  Social History Narrative   Patient lives at home with family.   Caffeine Use: 1 cup daily        Objective:  Physical Exam  General: AAO x3, NAD  Dermatological: There is mild discoloration, looks like a small bruise, along the central aspect of the right hallux toenail.  There is no edema, erythema on the nail corners except there is some minimal swelling just proximal to the hallux toenail there is no erythema or increase in warmth.  No open lesions identified.  Vascular: Dorsalis Pedis artery and Posterior Tibial artery pedal pulses are 2/4 bilateral with immedate capillary fill time.  There is no pain with calf compression, swelling, warmth, erythema.   Neruologic: Grossly intact via light touch bilateral.  Protective threshold with Semmes Wienstein monofilament intact to all pedal sites bilateral.   Musculoskeletal: There is tenderness palpation on the plantar aspect the right hallux IPJ and there is a bony prominence present at this area.  No pain with IPJ range of motion.  HAV is present but there is no tenderness to palpation to the area today.  Minimal swelling just proximal to the hallux toenail but there is no signs of infection.  Appears to be more prominent on the plantar hallux compared to the contralateral extremity.  Muscular strength 5/5 in all groups tested bilateral.  Gait: Unassisted, Nonantalgic.       Assessment:   Right hallux pain, toenail discoloration     Plan:  -Treatment options discussed including all alternatives,  risks, and complications -Etiology of symptoms were discussed -X-rays were obtained and reviewed with the patient.  It appears that there is an accessory bone on the plantar aspect of the hallux IPJ which is likely causing her symptoms.  There is no evidence of acute fracture or stress fracture. -We discussed offloading pads to help take pressure off the bony prominence of the right plantar hallux.  There is offloading pads were dispensed.  Also discussed the change in shoes.  This was  likely because the patient does drive a lot using his foot which is likely putting excess pressure to the toe.  There is no improvement next couple weeks or sooner to call the office for follow-up otherwise I will see her back as needed.  Vivi BarrackMatthew R Jaylyn Iyer DPM

## 2017-08-19 DIAGNOSIS — M25511 Pain in right shoulder: Secondary | ICD-10-CM | POA: Diagnosis not present

## 2017-08-20 DIAGNOSIS — M79605 Pain in left leg: Secondary | ICD-10-CM | POA: Diagnosis not present

## 2017-08-20 DIAGNOSIS — I87323 Chronic venous hypertension (idiopathic) with inflammation of bilateral lower extremity: Secondary | ICD-10-CM | POA: Diagnosis not present

## 2017-08-20 DIAGNOSIS — M79604 Pain in right leg: Secondary | ICD-10-CM | POA: Diagnosis not present

## 2017-08-27 ENCOUNTER — Institutional Professional Consult (permissible substitution): Payer: BLUE CROSS/BLUE SHIELD | Admitting: Pulmonary Disease

## 2017-08-27 NOTE — H&P (Signed)
PREOPERATIVE H&P  Chief Complaint: RIGHT SHOULDER OSTEOARTHRITIS,IMPINGEMENT SYNDROME,STRAIN OIF MUSCLES AND TENDONS OF THE ROTATOR CUFF  HPI: [name] is a 42 y.o. female who presents for preoperative history and physical with a diagnosis of RIGHT SHOULDER OSTEOARTHRITIS,IMPINGEMENT SYNDROME,STRAIN OIF MUSCLES AND TENDONS OF THE ROTATOR CUFF. Symptoms are rated as moderate to severe, and have been worsening.  This is significantly impairing activities of daily living.  She has elected for surgical management.   Past Medical History:  Diagnosis Date  . Allergy   . Anemia   . Anxiety   . Arthritis   . Constipation   . GERD (gastroesophageal reflux disease)   . Hx of endometriosis   . Macromastia 07/2011  . Migraine    Past Surgical History:  Procedure Laterality Date  . ABDOMINAL HYSTERECTOMY  12/12/2003  . ADENOIDECTOMY  05/2011  . ANKLE ARTHROSCOPY  12/29/2007   right; with extensive debridement  . BLADDER NECK RECONSTRUCTION  01/14/2011   Procedure: BLADDER NECK REPAIR;  Surgeon: Reva Boresanya S Pratt, MD;  Location: [facility];  Service: Gynecology;  Laterality: N/A;  Laparoscopic Repair of Incidental Cystotomy  . BREAST REDUCTION SURGERY  08/05/2011   Procedure: MAMMARY REDUCTION  (BREAST);  Surgeon: Louisa SecondGerald Truesdale, MD;  Location: [facility];  Service: Plastics;  Laterality: Bilateral;  bilateral  . CESAREAN SECTION  12/29/2000; 1994  . DILATION AND CURETTAGE OF UTERUS  07/08/2003   open laparoscopy  . GIVENS CAPSULE STUDY N/A 06/23/2017   Procedure: GIVENS CAPSULE STUDY;  Surgeon: Benancio DeedsArmbruster, Steven P, MD;  [facility];  Service: Gastroenterology;  Laterality: N/A;  . LAPAROSCOPIC SALPINGOOPHERECTOMY  01/14/2011   left  . REPAIR PERONEAL TENDONS ANKLE  01/03/2010   repair right subluxing peroneal tendons  . RIGHT OOPHORECTOMY     with lysis of adhesions  . rt. shoulder surgery    . SALPINGECTOMY  12/12/2003   right; with TAH  . TONSILLECTOMY  as a child  .  TUBAL LIGATION  12/29/2000   Social History   Socioeconomic History  . Marital status: Divorced    Spouse name: Not on file  . Number of children: 2  . Years of education: 12th  . Highest education level: Not on file  Occupational History    Employer: OTHER    Comment: [employer]  Social Needs  . Financial resource strain: Not on file  . Food insecurity:    Worry: Not on file    Inability: Not on file  . Transportation needs:    Medical: Not on file    Non-medical: Not on file  Tobacco Use  . Smoking status: Never Smoker  . Smokeless tobacco: Never Used  Substance and Sexual Activity  . Alcohol use: Yes    Comment: occasional   . Drug use: No  . Sexual activity: Not on file  Lifestyle  . Physical activity:    Days per week: Not on file    Minutes per session: Not on file  . Stress: Not on file  Relationships  . Social connections:    Talks on phone: Not on file    Gets together: Not on file    Attends religious service: Not on file    Active member of club or organization: Not on file    Attends meetings of clubs or organizations: Not on file    Relationship status: Not on file  Other Topics Concern  . Not on file  Social History Narrative   Patient lives at home  with family.   Caffeine Use: 1 cup daily   Family History  Problem Relation Age of Onset  . Diabetes Mother   . Hypertension Mother   . Thyroid disease Sister   . Heart attack Maternal Grandmother   . Stroke Maternal Grandfather   . Colon cancer Neg Hx   . Colon polyps Neg Hx   . Esophageal cancer Neg Hx   . Rectal cancer Neg Hx   . Stomach cancer Neg Hx    No Known Allergies Prior to Admission medications   Medication Sig Start Date End Date Taking? Authorizing Provider  benzonatate (TESSALON) 100 MG capsule Take 2 capsules (200 mg total) by mouth every 8 (eight) hours. 07/17/17   Georgetta Haber, NP  dextromethorphan-guaiFENesin (MUCINEX DM) 30-600 MG 12hr tablet Take 1 tablet by mouth 2 (two)  times daily. 07/17/17   Georgetta Haber, NP  esomeprazole (NEXIUM) 40 MG capsule TAKE 1 CAPSULE BY MOUTH EVERY MORNING Patient taking differently: Take 40 mg by mouth in the morning 02/05/17   Bing Neighbors, FNP  ferrous sulfate 325 (65 FE) MG EC tablet Take 1 tablet (325 mg total) by mouth 2 (two) times daily with a meal. 02/05/17   Armbruster, Willaim Rayas, MD  meloxicam (MOBIC) 7.5 MG tablet Take 1 tablet (7.5 mg total) by mouth daily. 08/11/17   Vivi Barrack, DPM  zolpidem (AMBIEN) 5 MG tablet Take 1 tablet (5 mg total) by mouth at bedtime as needed. for sleep 07/23/17   Bing Neighbors, FNP     Positive ROS: All other systems have been reviewed and were otherwise negative with the exception of those mentioned in the HPI and as above.  Physical Exam: General: Alert, no acute distress Cardiovascular: No pedal edema Respiratory: No cyanosis, no use of accessory musculature GI: No organomegaly, abdomen is soft and non-tender Skin: No lesions in the area of chief complaint Neurologic: Sensation intact distally Psychiatric: Patient is competent for consent with normal mood and affect Lymphatic: No axillary or cervical lymphadenopathy  MUSCULOSKELETAL: R shoulder: +impinegment, +obrien, +ac ttp, 5-/5 cuff  Assessment: RIGHT SHOULDER OSTEOARTHRITIS,IMPINGEMENT SYNDROME,STRAIN OIF MUSCLES AND TENDONS OF THE ROTATOR CUFF  Plan: Plan for Procedure(s): SHOULDER ARTHROSCOPY WITH DEBRIDEMENT AND BICEP TENDON REPAIR RESECTION DISTAL CLAVICAL SHOULDER ACROMIOPLASTY SHOULDER ARTHROSCOPY WITH ROTATOR CUFF REPAIR  The risks benefits and alternatives were discussed with the patient including but not limited to the risks of nonoperative treatment, versus surgical intervention including infection, bleeding, nerve injury,  blood clots, cardiopulmonary complications, morbidity, mortality, among others, and they were willing to proceed.   Bjorn Pippin, MD  08/27/2017 3:21 PM

## 2017-09-04 ENCOUNTER — Ambulatory Visit: Payer: BLUE CROSS/BLUE SHIELD | Admitting: Cardiovascular Disease

## 2017-09-09 ENCOUNTER — Ambulatory Visit (INDEPENDENT_AMBULATORY_CARE_PROVIDER_SITE_OTHER): Payer: BLUE CROSS/BLUE SHIELD | Admitting: Cardiovascular Disease

## 2017-09-09 ENCOUNTER — Encounter: Payer: Self-pay | Admitting: Cardiovascular Disease

## 2017-09-09 ENCOUNTER — Other Ambulatory Visit: Payer: Self-pay | Admitting: Family Medicine

## 2017-09-09 DIAGNOSIS — R0602 Shortness of breath: Secondary | ICD-10-CM

## 2017-09-09 DIAGNOSIS — R0789 Other chest pain: Secondary | ICD-10-CM

## 2017-09-09 DIAGNOSIS — R002 Palpitations: Secondary | ICD-10-CM

## 2017-09-09 MED ORDER — METOPROLOL TARTRATE 25 MG PO TABS: ORAL_TABLET | ORAL | 3 refills | Status: DC

## 2017-09-09 NOTE — Progress Notes (Signed)
Cardiology Office Note   Date:  09/09/2017   ID:  [name], DOB August 07, 1975, MRN [id number]  PCP:  Bing Neighbors, FNP  Cardiologist:   Chilton Si, MD   No chief complaint on file.    History of Present Illness: [name] is a 42 y.o. female who is being seen today for follow up.  She was initially seen 01/2017 for the evaluation of chest pain.  She has been seen in the emergency department several times with various complaints including chest pain, arm numbness, trouble swallowing, hives, hair loss, dysphagia, right hand and arm numbness and tingling.  Cardiac enzymes have been negative. EKGs have been negative for ischemia and showed sinus rhythm and tachycardia.  She was referred for ETT 01/24/17 that was negative for ischemia.  She achieved 11 METS on a Bruce protocol.  She also reported lower extremity edema, but none was noted on exam.  Since that appointment she was seen in the ED 02/19/17 with chest pain.  Cardiac enzymes were negative and chest CT-A was negative for PE.  No coronary calcification was noted.  She continues to have chest tightness twice per week.  She was referred for pulmonary function testing 03/2017 that was unremarkable.  [name] reports that she has not had any recurrent chest pain lately.  She has frequent episodes of heart racing that awaken her from sleep.  The last occurred a few weeks ago.  During that timeframe it was happening once per week.  When she wakes up it lasts for 2 or 3 minutes and then her chest remains tight.  It never occurs during the day.  She had one episode of heart racing that occurred when she was trying to exercise.  She has been scared to exercise again since that time she has no lower extremity edema   Unless she is standing for long periods of time.  It does improve with elevation of her legs.  She denies orthopnea or PND.  She was recently prescribed Nexium twice daily but has not yet started the  prescription.    Past Medical History:  Diagnosis Date  . Allergy   . Anemia   . Anxiety   . Arthritis   . Constipation   . GERD (gastroesophageal reflux disease)   . Hx of endometriosis   . Macromastia 07/2011  . Migraine     Past Surgical History:  Procedure Laterality Date  . ABDOMINAL HYSTERECTOMY  12/12/2003  . ADENOIDECTOMY  05/2011  . ANKLE ARTHROSCOPY  12/29/2007   right; with extensive debridement  . BLADDER NECK RECONSTRUCTION  01/14/2011   Procedure: BLADDER NECK REPAIR;  Surgeon: Reva Bores, MD;  Location: [facility];  Service: Gynecology;  Laterality: N/A;  Laparoscopic Repair of Incidental Cystotomy  . BREAST REDUCTION SURGERY  08/05/2011   Procedure: MAMMARY REDUCTION  (BREAST);  Surgeon: Louisa Second, MD;  Location: [facility];  Service: Plastics;  Laterality: Bilateral;  bilateral  . CESAREAN SECTION  12/29/2000; 1994  . DILATION AND CURETTAGE OF UTERUS  07/08/2003   open laparoscopy  . GIVENS CAPSULE STUDY N/A 06/23/2017   Procedure: GIVENS CAPSULE STUDY;  Surgeon: Benancio Deeds, MD;  Location: [facility];  Service: Gastroenterology;  Laterality: N/A;  . LAPAROSCOPIC SALPINGOOPHERECTOMY  01/14/2011   left  . REPAIR PERONEAL TENDONS ANKLE  01/03/2010   repair right subluxing peroneal tendons  . RIGHT OOPHORECTOMY     with lysis of adhesions  . rt. shoulder  surgery    . SALPINGECTOMY  12/12/2003   right; with TAH  . TONSILLECTOMY  as a child  . TUBAL LIGATION  12/29/2000     Current Outpatient Medications  Medication Sig Dispense Refill  . esomeprazole (NEXIUM) 40 MG capsule TAKE 1 CAPSULE BY MOUTH EVERY MORNING (Patient taking differently: Take 40 mg by mouth in the morning) 90 capsule 1  . ferrous sulfate 325 (65 FE) MG EC tablet Take 1 tablet (325 mg total) by mouth 2 (two) times daily with a meal. 60 tablet 3  . zolpidem (AMBIEN) 5 MG tablet Take 1 tablet (5 mg total) by mouth at bedtime as needed. for sleep 30 tablet 0  . metoprolol  tartrate (LOPRESSOR) 25 MG tablet 1/2 TABLET BY MOUTH AS NEEDED FOR FAST HEART RATE/PALPITATIONS 15 tablet 3   No current facility-administered medications for this visit.     Allergies:   Patient has no known allergies.    Social History:  The patient  reports that she has never smoked. She has never used smokeless tobacco. She reports that she drinks alcohol. She reports that she does not use drugs.   Family History:  The patient's family history includes Diabetes in her mother; Heart attack in her maternal grandmother; Hypertension in her mother; Stroke in her maternal grandfather; Thyroid disease in her sister.    ROS:  Please see the history of present illness.   Otherwise, review of systems are positive for feet cramping.   All other systems are reviewed and negative.    PHYSICAL EXAM: VS:  BP 128/72   Pulse 63   Ht  (1.626 m)   Wt 230 lb 12.8 oz (104.7 kg)   LMP 12/27/2003   BMI 39.62 kg/m  , BMI Body mass index is 39.62 kg/m. GENERAL:  Well appearing HEENT: Pupils equal round and reactive, fundi not visualized, oral mucosa unremarkable NECK:  No jugular venous distention, waveform within normal limits, carotid upstroke brisk and symmetric, no bruits, no thyromegaly LYMPHATICS:  No cervical adenopathy LUNGS:  Clear to auscultation bilaterally HEART:  RRR.  PMI not displaced or sustained,S1 and S2 within normal limits, no S3, no S4, no clicks, no rubs, no murmurs ABD:  Flat, positive bowel sounds normal in frequency in pitch, no bruits, no rebound, no guarding, no midline pulsatile mass, no hepatomegaly, no splenomegaly EXT:  2 plus pulses throughout, no edema, no cyanosis no clubbing SKIN:  No rashes no nodules NEURO:  Cranial nerves II through XII grossly intact, motor grossly intact throughout PSYCH:  Cognitively intact, oriented to person place and time   EKG:  EKG is ordered today. The ekg ordered 01/14/17 demonstrates sinus rhythm. Rate 70 bpm. 09/09/2017: Sinus  rhythm.  Rate 63 bpm.  Incomplete right bundle branch block.  ETT 01/24/17:   Blood pressure demonstrated a normal response to exercise.  There was no ST segment deviation noted during stress.   1. Good exercise tolerance.  2. No evidence for ischemia by ST segment analysis.   PFTs 03/12/17: Normal.  Recent Labs: 12/09/2016: TSH 3.821 07/14/2017: ALT 18; BUN 6; Creatinine, Ser 0.86; Hemoglobin 12.1; Platelets 340; Potassium 4.2; Sodium 141    Lipid Panel    Component Value Date/Time   CHOL 177 12/06/2015 1153   TRIG 64 12/06/2015 1153   HDL 55 12/06/2015 1153   CHOLHDL 3.2 12/06/2015 1153   VLDL 13 12/06/2015 1153   LDLCALC 109 12/06/2015 1153      Wt Readings from Last 3  Encounters:  09/09/17 230 lb 12.8 oz (104.7 kg)  07/14/17 219 lb 6.4 oz (99.5 kg)  06/23/17 215 lb (97.5 kg)     ASSESSMENT AND PLAN:  # Chest pain:  Ms. Snowberger's symptoms are not due to ischemia.  ETT was negative and there were no coronary calcifications on chest CT.  No further cardiac work up advised.    # Palpitations: Likely related to GERD as they awaken her from sleep and her GERD is not under control.  Encouraged her to increase PPI to bid as instructed.  Will give metoprolol tartrate 12.5mg  prn palpitations.    Current medicines are reviewed at length with the patient today.  The patient does not have concerns regarding medicines.  The following changes have been made:  Prn metoprolol.   Labs/ tests ordered today include:   No orders of the defined types were placed in this encounter.    Disposition:   FU with Dustin Bumbaugh C. Duke Salvia, MD, [facility] in 3 months.      Signed, Ferrin Liebig C. Duke Salvia, MD, [facility]  09/09/2017 11:18 AM    [facility]

## 2017-09-09 NOTE — Patient Instructions (Addendum)
Medication Instructions:  START METOPROLOL TART 25 MG 1/2 TABLET AS NEEDED FOR FAST HEART RATE/PALPITATIONS   Labwork: NONE  Testing/Procedures: NONE  Follow-Up: Your physician recommends that you schedule a follow-up appointment in: 3 MONTH OV   If you need a refill on your cardiac medications before your next appointment, please call your pharmacy.

## 2017-09-10 ENCOUNTER — Other Ambulatory Visit: Payer: Self-pay | Admitting: Family Medicine

## 2017-09-10 DIAGNOSIS — G4700 Insomnia, unspecified: Secondary | ICD-10-CM

## 2017-09-10 MED ORDER — ZOLPIDEM TARTRATE 5 MG PO TABS: 5.0000 mg | ORAL_TABLET | Freq: Every evening | ORAL | 0 refills | Status: DC | PRN

## 2017-09-10 NOTE — Progress Notes (Signed)
Reviewed [facility] Substance Reporting system prior to prescribing opiate medications. No inconsistencies noted.   Meds ordered this encounter  Medications  . zolpidem (AMBIEN) 5 MG tablet    Sig: Take 1 tablet (5 mg total) by mouth at bedtime as needed. for sleep    Dispense:  30 tablet    Refill:  0    Not to exceed 4 additional fills before 07/10/2017    Order Specific Question:   Supervising Provider    Answer:   Quentin Angst [[id number]]    Nolon Nations  MSN, FNP-C [facility] [address] [place], [place] [address] [phone]

## 2017-09-10 NOTE — Telephone Encounter (Signed)
[place],  Can this be refilled for patient? 

## 2017-09-22 ENCOUNTER — Encounter (HOSPITAL_BASED_OUTPATIENT_CLINIC_OR_DEPARTMENT_OTHER): Payer: Self-pay | Admitting: *Deleted

## 2017-09-22 ENCOUNTER — Other Ambulatory Visit: Payer: Self-pay

## 2017-09-22 ENCOUNTER — Ambulatory Visit: Payer: Self-pay | Admitting: Diagnostic Neuroimaging

## 2017-09-22 ENCOUNTER — Encounter

## 2017-09-25 ENCOUNTER — Encounter (HOSPITAL_BASED_OUTPATIENT_CLINIC_OR_DEPARTMENT_OTHER): Payer: Self-pay | Admitting: *Deleted

## 2017-09-25 ENCOUNTER — Other Ambulatory Visit: Payer: Self-pay

## 2017-09-25 ENCOUNTER — Ambulatory Visit (HOSPITAL_BASED_OUTPATIENT_CLINIC_OR_DEPARTMENT_OTHER)
Admission: RE | Admit: 2017-09-25 | Discharge: 2017-09-25 | Disposition: A | Discharge status: Home / Self Care | Payer: BLUE CROSS/BLUE SHIELD | Source: Ambulatory Visit | Attending: Orthopaedic Surgery | Admitting: Orthopaedic Surgery

## 2017-09-25 ENCOUNTER — Encounter (HOSPITAL_BASED_OUTPATIENT_CLINIC_OR_DEPARTMENT_OTHER)
Admission: RE | Disposition: A | Discharge status: Home / Self Care | Payer: Self-pay | Source: Ambulatory Visit | Attending: Orthopaedic Surgery

## 2017-09-25 ENCOUNTER — Ambulatory Visit (HOSPITAL_BASED_OUTPATIENT_CLINIC_OR_DEPARTMENT_OTHER): Payer: BLUE CROSS/BLUE SHIELD | Admitting: Anesthesiology

## 2017-09-25 DIAGNOSIS — K219 Gastro-esophageal reflux disease without esophagitis: Secondary | ICD-10-CM | POA: Diagnosis not present

## 2017-09-25 DIAGNOSIS — Z79899 Other long term (current) drug therapy: Secondary | ICD-10-CM | POA: Insufficient documentation

## 2017-09-25 DIAGNOSIS — X58XXXA Exposure to other specified factors, initial encounter: Secondary | ICD-10-CM | POA: Insufficient documentation

## 2017-09-25 DIAGNOSIS — F419 Anxiety disorder, unspecified: Secondary | ICD-10-CM | POA: Insufficient documentation

## 2017-09-25 DIAGNOSIS — D509 Iron deficiency anemia, unspecified: Secondary | ICD-10-CM | POA: Diagnosis not present

## 2017-09-25 DIAGNOSIS — M7541 Impingement syndrome of right shoulder: Secondary | ICD-10-CM | POA: Diagnosis not present

## 2017-09-25 DIAGNOSIS — M19011 Primary osteoarthritis, right shoulder: Secondary | ICD-10-CM | POA: Insufficient documentation

## 2017-09-25 DIAGNOSIS — S46011A Strain of muscle(s) and tendon(s) of the rotator cuff of right shoulder, initial encounter: Secondary | ICD-10-CM | POA: Diagnosis not present

## 2017-09-25 DIAGNOSIS — M24111 Other articular cartilage disorders, right shoulder: Secondary | ICD-10-CM | POA: Diagnosis not present

## 2017-09-25 DIAGNOSIS — G8918 Other acute postprocedural pain: Secondary | ICD-10-CM | POA: Diagnosis not present

## 2017-09-25 HISTORY — PX: SHOULDER ACROMIOPLASTY: SHX6093

## 2017-09-25 HISTORY — PX: RESECTION DISTAL CLAVICAL: SHX5053

## 2017-09-25 HISTORY — PX: SHOULDER ARTHROSCOPY WITH DEBRIDEMENT AND BICEP TENDON REPAIR: SHX5690

## 2017-09-25 HISTORY — PX: SHOULDER ARTHROSCOPY WITH ROTATOR CUFF REPAIR: SHX5685

## 2017-09-25 SURGERY — SHOULDER ARTHROSCOPY WITH DEBRIDEMENT AND BICEP TENDON REPAIR
Anesthesia: General | Laterality: Right

## 2017-09-25 MED ORDER — SODIUM CHLORIDE 0.9 % IR SOLN
Status: DC | PRN
  Administered 2017-09-25: 6000 mL

## 2017-09-25 MED ORDER — HYDROMORPHONE HCL 1 MG/ML IJ SOLN
0.2500 mg | INTRAMUSCULAR | Status: DC | PRN
  Administered 2017-09-25: 0.25 mg via INTRAVENOUS
  Administered 2017-09-25 (×3): 0.5 mg via INTRAVENOUS

## 2017-09-25 MED ORDER — ONDANSETRON HCL 4 MG PO TABS: 4.0000 mg | ORAL_TABLET | Freq: Three times a day (TID) | ORAL | 1 refills | Status: AC | PRN

## 2017-09-25 MED ORDER — DEXAMETHASONE SODIUM PHOSPHATE 4 MG/ML IJ SOLN
INTRAMUSCULAR | Status: DC | PRN
  Administered 2017-09-25: 10 mg via INTRAVENOUS

## 2017-09-25 MED ORDER — LIDOCAINE 2% (20 MG/ML) 5 ML SYRINGE
INTRAMUSCULAR | Status: DC | PRN
  Administered 2017-09-25: 40 mg via INTRAVENOUS

## 2017-09-25 MED ORDER — FENTANYL CITRATE (PF) 100 MCG/2ML IJ SOLN
INTRAMUSCULAR | Status: AC
  Filled 2017-09-25: qty 2

## 2017-09-25 MED ORDER — HYDROMORPHONE HCL 1 MG/ML IJ SOLN
INTRAMUSCULAR | Status: AC
  Filled 2017-09-25: qty 0.5

## 2017-09-25 MED ORDER — CEFAZOLIN SODIUM-DEXTROSE 2-4 GM/100ML-% IV SOLN
INTRAVENOUS | Status: AC
  Filled 2017-09-25: qty 100

## 2017-09-25 MED ORDER — ONDANSETRON HCL 4 MG/2ML IJ SOLN
INTRAMUSCULAR | Status: AC
  Filled 2017-09-25: qty 2

## 2017-09-25 MED ORDER — CELECOXIB 200 MG PO CAPS: 200.0000 mg | ORAL_CAPSULE | Freq: Every day | ORAL | 2 refills | Status: DC

## 2017-09-25 MED ORDER — ONDANSETRON HCL 4 MG/2ML IJ SOLN
INTRAMUSCULAR | Status: DC | PRN
  Administered 2017-09-25: 4 mg via INTRAVENOUS

## 2017-09-25 MED ORDER — LACTATED RINGERS IV SOLN
INTRAVENOUS | Status: DC
  Administered 2017-09-25 (×2): via INTRAVENOUS

## 2017-09-25 MED ORDER — MIDAZOLAM HCL 2 MG/2ML IJ SOLN
1.0000 mg | INTRAMUSCULAR | Status: DC | PRN
  Administered 2017-09-25: 2 mg via INTRAVENOUS

## 2017-09-25 MED ORDER — MIDAZOLAM HCL 2 MG/2ML IJ SOLN
INTRAMUSCULAR | Status: AC
  Filled 2017-09-25: qty 2

## 2017-09-25 MED ORDER — BUPIVACAINE-EPINEPHRINE (PF) 0.5% -1:200000 IJ SOLN
INTRAMUSCULAR | Status: DC | PRN
  Administered 2017-09-25: 30 mL via PERINEURAL

## 2017-09-25 MED ORDER — FENTANYL CITRATE (PF) 100 MCG/2ML IJ SOLN
50.0000 ug | INTRAMUSCULAR | Status: AC | PRN
  Administered 2017-09-25: 50 ug via INTRAVENOUS
  Administered 2017-09-25: 100 ug via INTRAVENOUS
  Administered 2017-09-25: 50 ug via INTRAVENOUS

## 2017-09-25 MED ORDER — SUCCINYLCHOLINE CHLORIDE 20 MG/ML IJ SOLN
INTRAMUSCULAR | Status: DC | PRN
  Administered 2017-09-25: 100 mg via INTRAVENOUS

## 2017-09-25 MED ORDER — PROPOFOL 10 MG/ML IV BOLUS
INTRAVENOUS | Status: DC | PRN
  Administered 2017-09-25: 50 mg via INTRAVENOUS
  Administered 2017-09-25: 150 mg via INTRAVENOUS

## 2017-09-25 MED ORDER — DEXAMETHASONE SODIUM PHOSPHATE 10 MG/ML IJ SOLN
INTRAMUSCULAR | Status: AC
  Filled 2017-09-25: qty 1

## 2017-09-25 MED ORDER — CHLORHEXIDINE GLUCONATE 4 % EX LIQD: 60.0000 mL | Freq: Once | CUTANEOUS | Status: DC

## 2017-09-25 MED ORDER — OXYCODONE HCL 5 MG PO TABS: ORAL_TABLET | ORAL | 0 refills | Status: AC

## 2017-09-25 MED ORDER — CEFAZOLIN SODIUM-DEXTROSE 2-4 GM/100ML-% IV SOLN
2.0000 g | INTRAVENOUS | Status: AC
  Administered 2017-09-25: 2 g via INTRAVENOUS

## 2017-09-25 MED ORDER — ACETAMINOPHEN 500 MG PO TABS: 1000.0000 mg | ORAL_TABLET | Freq: Three times a day (TID) | ORAL | 0 refills | Status: AC

## 2017-09-25 MED ORDER — SCOPOLAMINE 1 MG/3DAYS TD PT72: 1.0000 | MEDICATED_PATCH | Freq: Once | TRANSDERMAL | Status: DC | PRN

## 2017-09-25 SURGICAL SUPPLY — 71 items
AID PSTN UNV HD RSTRNT DISP (MISCELLANEOUS) ×1
APL SKNCLS STERI-STRIP NONHPOA (GAUZE/BANDAGES/DRESSINGS) ×1
BENZOIN TINCTURE PRP APPL 2/3 (GAUZE/BANDAGES/DRESSINGS) ×2 IMPLANT
BLADE EXCALIBUR 4.0X13 (MISCELLANEOUS) ×2 IMPLANT
BLADE HEX COATED 2.75 (ELECTRODE) ×2 IMPLANT
BLADE SHAVER BONE 5.0X13 (MISCELLANEOUS) IMPLANT
BLADE SURG 10 STRL SS (BLADE) ×2 IMPLANT
BNDG COHESIVE 4X5 TAN STRL (GAUZE/BANDAGES/DRESSINGS) IMPLANT
BURR OVAL 8 FLU 4.0X13 (MISCELLANEOUS) IMPLANT
CANNULA 5.75X71 LONG (CANNULA) IMPLANT
CANNULA PASSPORT BUTTON 10-40 (CANNULA) IMPLANT
CANNULA TWIST IN 8.25X7CM (CANNULA) IMPLANT
CHLORAPREP W/TINT 26ML (MISCELLANEOUS) ×2 IMPLANT
DECANTER SPIKE VIAL GLASS SM (MISCELLANEOUS) IMPLANT
DISSECTOR 3.5MM X 13CM CVD (MISCELLANEOUS) IMPLANT
DISSECTOR 4.0MMX13CM CVD (MISCELLANEOUS) IMPLANT
DRAPE IMP U-DRAPE 54X76 (DRAPES) ×2 IMPLANT
DRAPE INCISE IOBAN 66X45 STRL (DRAPES) IMPLANT
DRAPE STERI 35X30 U-POUCH (DRAPES) ×2 IMPLANT
DRAPE U-SHAPE 76X120 STRL (DRAPES) ×4 IMPLANT
DRSG PAD ABDOMINAL 8X10 ST (GAUZE/BANDAGES/DRESSINGS) ×2 IMPLANT
ELECT NDL TIP 2.8 STRL (NEEDLE) IMPLANT
ELECT NEEDLE TIP 2.8 STRL (NEEDLE) IMPLANT
ELECT REM PT RETURN 9FT ADLT (ELECTROSURGICAL) ×2
ELECTRODE REM PT RTRN 9FT ADLT (ELECTROSURGICAL) ×1 IMPLANT
GAUZE SPONGE 4X4 12PLY STRL (GAUZE/BANDAGES/DRESSINGS) ×2 IMPLANT
GLOVE BIOGEL PI IND STRL 8 (GLOVE) ×1 IMPLANT
GLOVE BIOGEL PI INDICATOR 8 (GLOVE) ×1
GLOVE ECLIPSE 8.0 STRL XLNG CF (GLOVE) ×2 IMPLANT
GOWN STRL REUS W/ TWL LRG LVL3 (GOWN DISPOSABLE) ×2 IMPLANT
GOWN STRL REUS W/TWL LRG LVL3 (GOWN DISPOSABLE) ×2
GOWN STRL REUS W/TWL XL LVL3 (GOWN DISPOSABLE) ×2 IMPLANT
KIT STABILIZATION SHOULDER (MISCELLANEOUS) ×2 IMPLANT
LASSO CRESCENT QUICKPASS (SUTURE) IMPLANT
MANIFOLD NEPTUNE II (INSTRUMENTS) ×2 IMPLANT
NDL SAFETY ECLIPSE 18X1.5 (NEEDLE) ×1 IMPLANT
NDL SCORPION MULTI FIRE (NEEDLE) IMPLANT
NDL SUT 6 .5 CRC .975X.05 MAYO (NEEDLE) IMPLANT
NEEDLE HYPO 18GX1.5 SHARP (NEEDLE)
NEEDLE MAYO TAPER (NEEDLE)
NEEDLE SCORPION MULTI FIRE (NEEDLE) IMPLANT
PACK ARTHROSCOPY DSU (CUSTOM PROCEDURE TRAY) ×2 IMPLANT
PACK BASIN DAY SURGERY FS (CUSTOM PROCEDURE TRAY) ×2 IMPLANT
PENCIL BUTTON HOLSTER BLD 10FT (ELECTRODE) ×1 IMPLANT
PORT APPOLLO RF 90DEGREE MULTI (SURGICAL WAND) ×1 IMPLANT
PROBE BIPOLAR ATHRO 135MM 90D (MISCELLANEOUS) ×1 IMPLANT
RESTRAINT HEAD UNIVERSAL NS (MISCELLANEOUS) ×2 IMPLANT
SHEET MEDIUM DRAPE 40X70 STRL (DRAPES) ×1 IMPLANT
SLEEVE SCD COMPRESS KNEE MED (MISCELLANEOUS) ×2 IMPLANT
SLING ARM FOAM STRAP LRG (SOFTGOODS) ×1 IMPLANT
SLING ARM IMMOBILIZER LRG (SOFTGOODS) IMPLANT
SLING ARM IMMOBILIZER MED (SOFTGOODS) IMPLANT
SLING ARM MED ADULT FOAM STRAP (SOFTGOODS) IMPLANT
SLING ARM XL FOAM STRAP (SOFTGOODS) IMPLANT
SPONGE LAP 4X18 RFD (DISPOSABLE) IMPLANT
STRIP CLOSURE SKIN 1/2X4 (GAUZE/BANDAGES/DRESSINGS) ×1 IMPLANT
SUT FIBERWIRE #2 38 T-5 BLUE (SUTURE)
SUT MNCRL AB 4-0 PS2 18 (SUTURE) IMPLANT
SUT PDS AB 1 CT  36 (SUTURE)
SUT PDS AB 1 CT 36 (SUTURE) IMPLANT
SUT TIGER TAPE 7 IN WHITE (SUTURE) IMPLANT
SUTURE FIBERWR #2 38 T-5 BLUE (SUTURE) IMPLANT
SUTURE TAPE TIGERLINK 1.3MM BL (SUTURE) IMPLANT
SUTURETAPE TIGERLINK 1.3MM BL (SUTURE)
SYR 5ML LUER SLIP (SYRINGE) ×1 IMPLANT
TAPE FIBER 2MM 7IN #2 BLUE (SUTURE) IMPLANT
TOWEL OR 17X24 6PK STRL BLUE (TOWEL DISPOSABLE) ×2 IMPLANT
TOWEL OR NON WOVEN STRL DISP B (DISPOSABLE) ×2 IMPLANT
TUBE CONNECTING 20X1/4 (TUBING) ×5 IMPLANT
TUBE SUCTION HIGH CAP CLEAR NV (SUCTIONS) IMPLANT
TUBING ARTHROSCOPY IRRIG 16FT (MISCELLANEOUS) ×2 IMPLANT

## 2017-09-25 NOTE — Anesthesia Procedure Notes (Signed)
Anesthesia Regional Block: Interscalene brachial plexus block   Pre-Anesthetic Checklist: ,, timeout performed, Correct Patient, Correct Site, Correct Laterality, Correct Procedure, Correct Position, site marked, Risks and benefits discussed, pre-op evaluation,  At surgeon's request and post-op pain management  Laterality: Right  Prep: Maximum Sterile Barrier Precautions used, chloraprep       Needles:  Injection technique: Single-shot  Needle Type: Echogenic Stimulator Needle     Needle Length: 5cm  Needle Gauge: 22     Additional Needles:   Procedures:,,,, ultrasound used (permanent image in chart),,,,  Narrative:  Start time: 09/25/2017 8:58 AM End time: 09/25/2017 9:08 AM Injection made incrementally with aspirations every 5 mL. Anesthesiologist: Gaynelle Adu, MD  Additional Notes: 2% Lidocaine skin wheel.

## 2017-09-25 NOTE — Anesthesia Postprocedure Evaluation (Signed)
Anesthesia Post Note  Patient: Shatonya S Eagleson  Procedure(s) Performed: SHOULDER ARTHROSCOPY WITH DEBRIDEMENT AND BICEP TENDON REPAIR (Right ) RESECTION DISTAL CLAVICAL (Right ) SHOULDER ACROMIOPLASTY (Right ) SHOULDER ARTHROSCOPY WITH ROTATOR CUFF REPAIR (Right )     Patient location during evaluation: PACU Anesthesia Type: General Level of consciousness: awake and alert Pain management: pain level controlled Vital Signs Assessment: post-procedure vital signs reviewed and stable Respiratory status: spontaneous breathing, nonlabored ventilation and respiratory function stable Cardiovascular status: blood pressure returned to baseline and stable Postop Assessment: no apparent nausea or vomiting Anesthetic complications: no    Last Vitals:  Vitals:   09/25/17 1220 09/25/17 1230  BP:  119/81  Pulse: 74 77  Resp: (!) 22 16  Temp:  36.6 C  SpO2: 94% 95%    Last Pain:  Vitals:   09/25/17 1230  TempSrc:   PainSc: 2                  Aryiah Monterosso,W. EDMOND

## 2017-09-25 NOTE — Progress Notes (Signed)
Assisted Dr. Edmond Fitzgerald with right, ultrasound guided, interscalene  block. Side rails up, monitors on throughout procedure. See vital signs in flow sheet. Tolerated Procedure well. 

## 2017-09-25 NOTE — Anesthesia Preprocedure Evaluation (Addendum)
Anesthesia Evaluation  Patient identified by MRN, date of birth, ID band Patient awake    Reviewed: Allergy & Precautions, H&P , NPO status , Patient's Chart, lab work & pertinent test results  Airway Mallampati: II  TM Distance: >3 FB Neck ROM: Full    Dental no notable dental hx. (+) Teeth Intact, Dental Advisory Given   Pulmonary neg pulmonary ROS,    Pulmonary exam normal breath sounds clear to auscultation       Cardiovascular negative cardio ROS   Rhythm:Regular Rate:Normal     Neuro/Psych  Headaches, Anxiety negative psych ROS   GI/Hepatic Neg liver ROS, GERD  Medicated and Controlled,  Endo/Other  negative endocrine ROS  Renal/GU negative Renal ROS  negative genitourinary   Musculoskeletal  (+) Arthritis , Osteoarthritis,    Abdominal   Peds  Hematology negative hematology ROS (+)   Anesthesia Other Findings   Reproductive/Obstetrics negative OB ROS                            Anesthesia Physical Anesthesia Plan  ASA: II  Anesthesia Plan: General   Post-op Pain Management:  Regional for Post-op pain   Induction: Intravenous  PONV Risk Score and Plan: 3 and Ondansetron, Dexamethasone and Midazolam  Airway Management Planned: Oral ETT  Additional Equipment:   Intra-op Plan:   Post-operative Plan: Extubation in OR  Informed Consent: I have reviewed the patients History and Physical, chart, labs and discussed the procedure including the risks, benefits and alternatives for the proposed anesthesia with the patient or authorized representative who has indicated his/her understanding and acceptance.   Dental advisory given  Plan Discussed with: CRNA  Anesthesia Plan Comments:         Anesthesia Quick Evaluation

## 2017-09-25 NOTE — Transfer of Care (Signed)
Immediate Anesthesia Transfer of Care Note  Patient: [name]  Procedure(s) Performed: SHOULDER ARTHROSCOPY WITH DEBRIDEMENT AND BICEP TENDON REPAIR (Right ) RESECTION DISTAL CLAVICAL (Right ) SHOULDER ACROMIOPLASTY (Right ) SHOULDER ARTHROSCOPY WITH ROTATOR CUFF REPAIR (Right )  Patient Location: PACU  Anesthesia Type:GA combined with regional for post-op pain  Level of Consciousness: awake  Airway & Oxygen Therapy: Patient Spontanous Breathing and Patient connected to face mask oxygen  Post-op Assessment: Report given to RN and Post -op Vital signs reviewed and stable  Post vital signs: Reviewed and stable  Last Vitals:  Vitals Value Taken Time  BP 139/82 09/25/2017 11:15 AM  Temp    Pulse 102 09/25/2017 11:17 AM  Resp 22 09/25/2017 11:17 AM  SpO2 100 % 09/25/2017 11:17 AM  Vitals shown include unvalidated device data.  Last Pain:  Vitals:   09/25/17 0910  TempSrc:   PainSc: 0-No pain      Patients Stated Pain Goal: 3 (09/25/17 0856)  Complications: No apparent anesthesia complications

## 2017-09-25 NOTE — Interval H&P Note (Signed)
Discussed case, risks and benefits with patient again.  All questions answered, no change to history.  Dax Varkey MD  

## 2017-09-25 NOTE — Op Note (Signed)
Orthopaedic Surgery Operative Note (CSN: [id number])  [name]  30-May-1975 Date of Surgery: 09/25/2017   Diagnoses:  RIGHT SHOULDER OSTEOARTHRITIS,IMPINGEMENT SYNDROME,STRAIN OIF MUSCLES AND TENDONS OF THE ROTATOR CUFF  Procedure: Right shoulder extensive debridement with biceps tenotomy - 29823 Right shoulder subacromial decompression - 29826 Right distal clavicle excision revision - 29824   Operative Finding Exam under anesthesia: Full motion, no abnormality Articular space: No loose bodies, capsule intact, anterior and superior labral fraying Chondral surfaces:Intact, no sign of chondral degeneration on the glenoid or humeral head Biceps: Type 2  slap Subscapularis: intact Superior Cuff:intact articular side Bursal side: significant spurring of acromion with bursal sided partial thickness tear and fraying likely 20% total thickness, tendon probed solidly.  Superior spur noted from the distal clavicle, this was removed.  If patient ever failed this in light of her diffuse partial thickness wear we may need to be prepared for [facility] or other augment as the tissue quality may turn out to be poor.  Post-operative plan: The patient will be NWB in sling.  The patient will be dc home.  DVT prophylaxis not indicated in isolated upper extremity surgery patient with no specific risks factors.  Pain control with PRN pain medication preferring oral medicines.  Follow up plan will be scheduled in approximately 7 days for incision check and XR AP and outlet.  Post-Op Diagnosis: Same Surgeons:Primary: Bjorn Pippin, MD Assistants:none Location: [facility] OR ROOM 6 Anesthesia: Choice Antibiotics: Ancef 2g preop Tourniquet time: * No tourniquets in log * Estimated Blood Loss: minimal Complications: None Specimens: None Implants: * No implants in log *  Indications for Surgery:   [name] is a 42 y.o. female with previous history of subacromial decompression distal clavicle excision  arthroscopically which provided some relief for period of time but she continued to have anterior shoulder pain as well as mechanical pain when she perform cross body adduction.  X-rays demonstrated that a small osteophyte formed at the superior aspect of her previous distal clavicle resection.  We talked about continue nonoperative measures and she failed injection, was intolerant of NSAIDs and physical therapy.  MRI demonstrated no's other specific pathology within the joint.  Benefits and risks of operative and nonoperative management were discussed prior to surgery with patient/guardian(s) and informed consent form was completed.  Specific risks including infection, need for additional surgery, to need pain, stiffness, regrowth of bone spur and stiffness.   Procedure:   Patient was correctly identified in the preoperative holding area and operative site marked.  Patient brought to OR and positioned beachchair on an Millen table ensuring that all bony prominences were padded and the head was in an appropriate location.  Anesthesia was induced and the operative shoulder was prepped and draped in the usual sterile fashion.  Timeout was called preincision.  A standard posterior viewing portal was made after localizing the portal with a spinal needle.  An anterior accessory portal was also made.  After clearing the articular space the camera was positioned in the subacromial space.  Findings above.  Biceps tenotomy was performed with a biting device and the stump was debrided with an shaver and RF ablator back to a stable base. Anterior labrum debrided as well.  Distal Clavicle resection:  The scope was placed in the subacromial space from the posterior portal.  A hemostat was placed through the anterior portal and we spread at the [facility] joint.  We visualized the distal clavicle and noted the inferior portion to be appropriately  resected but the superior portion had either a new spur or retained previous  clavicle.  We considered open excision but were able to visualize the entirity.  A burr was then inserted and 10 mm of distal clavicle superiorly was resected taking care to avoid damage to the capsule around the joint and avoiding overhanging bone posteriorly.  We checked resection with fluoro and were happy with this.  Subacromial decompression: We made a lateral portal with spinal needle guidance. We then proceeded to debride bursal tissue extensively with a shaver and arthrocare device. At that point we continued to identify the borders of the acromion and identify the spur. We then carefully preserved the deltoid fascia and used a burr to convert the type 2 acromion to a Type 1 flat acromion without issue.   We performed a debridement of the bursal sided fraying of the cuff which involved 20% thickness.  No full thickness tearing and remaining cuff was solid.    The incisions were closed with absorbable monocryl, benzoin and steri strips.  A sterile dressing was placed along with a sling. The patient was awoken from general anesthesia and taken to the PACU in stable condition without complication.

## 2017-09-25 NOTE — Discharge Instructions (Signed)

## 2017-09-26 ENCOUNTER — Encounter (HOSPITAL_BASED_OUTPATIENT_CLINIC_OR_DEPARTMENT_OTHER): Payer: Self-pay | Admitting: Orthopaedic Surgery

## 2017-09-26 NOTE — Addendum Note (Signed)
Addendum  created 09/26/17 0841 by Olando Willems, Jewel Baize, CRNA   Charge Capture section accepted

## 2017-09-30 DIAGNOSIS — R198 Other specified symptoms and signs involving the digestive system and abdomen: Secondary | ICD-10-CM | POA: Diagnosis not present

## 2017-09-30 DIAGNOSIS — K219 Gastro-esophageal reflux disease without esophagitis: Secondary | ICD-10-CM | POA: Diagnosis not present

## 2017-10-02 ENCOUNTER — Encounter: Payer: Self-pay | Admitting: Physician Assistant

## 2017-10-02 ENCOUNTER — Ambulatory Visit (INDEPENDENT_AMBULATORY_CARE_PROVIDER_SITE_OTHER): Payer: BLUE CROSS/BLUE SHIELD | Admitting: Physician Assistant

## 2017-10-02 ENCOUNTER — Encounter: Payer: Self-pay | Admitting: Gastroenterology

## 2017-10-02 VITALS — BP 110/80 | HR 72 | Ht 64.0 in | Wt 230.4 lb

## 2017-10-02 DIAGNOSIS — R110 Nausea: Secondary | ICD-10-CM

## 2017-10-02 DIAGNOSIS — K219 Gastro-esophageal reflux disease without esophagitis: Secondary | ICD-10-CM

## 2017-10-02 MED ORDER — SUCRALFATE 1 G PO TABS: ORAL_TABLET | ORAL | 3 refills | Status: DC

## 2017-10-02 MED ORDER — RANITIDINE HCL 150 MG PO TABS: ORAL_TABLET | ORAL | 3 refills | Status: DC

## 2017-10-02 MED ORDER — AMBULATORY NON FORMULARY MEDICATION: 0 refills | Status: DC

## 2017-10-02 NOTE — Progress Notes (Signed)
Chief Complaint: Nausea, bloating, GERD  HPI:    [name] is a 42 year old African-American female with a past medical history as listed below, who follows with Dr. Adela Lank and presents to clinic today with a complaint of nausea, bloating and reflux.    06/02/2017 office visit for follow-up of reflux as well as new complaint of microscopic anemia with iron deficiency.  Complained of abdominal bloating at that time.  Patient had repeat labs that day.  Patient tried on low FODMAP diet given sample of VSL number 3 capsules 1 capsule twice daily to see if this helped.    09/25/2017 op visit for right shoulder surgery.    Today, explains that she has had issues with reflux for years in fact she used to follow with Dr. Loreta Ave who tried her on various medications including Dexilant, omeprazole, Prilosec, pantoprazole and others in the past which never fully abated her symptoms.  Currently she has been using Nexium 40 mg twice daily, as increased by her ENT about 2 months ago.  Continues to describe reflux symptoms and describes that when she eats it will come back up with an acid-like material, this is worse when she lays down to sleep.  Chronic throat pain which is unchanged over the past 6 months and now the symptoms are keeping her awake at night irregardless of sleeping with the head of her bed elevated.  Describes reflux symptoms throughout the day, worse after eating anything.  Also with early satiety recently and nausea.    Does describe being on an increased dosage of Ibuprofen recently around timing of her shoulder surgery describing taking at least 600 mg every 4 hours for a week or so before decreasing this to a couple times a day and using Celebrex as prescribed by her orthopedic physician.  Explains that the symptoms were occurring before then.    Denies fever, chills, blood in her stool, melena or weight loss.  Recent workup: 06/23/2017 pill capsule endoscopy with one tiny erosion at 33  minutes. EGD 02/20/2017 - 2cm HH, otherwise normal exam, biopsies negative for HP Colonoscopy 02/20/2017 - focal mild erythema in the rectum - biopsied showed no evidence of colitis, otherwise normal colon and ileum  MRI liver / abdomen - 02/10/2017 - focal fatty infiltration, normal biliary tree, otherwise no pathology noted in the abdomen   Historic evaluation: CT scan neck 12/02/16 - normal CT angio of chest 12/02/2016 - normal EGD 08/19/2013 - Dr. Alycia Rossetti - normal esophagus, mild gastritis Esophageal manometry 2015 - normal PH study 2015 - normal exam, Demeester score of 5.7, positive symptom index for regurgitation and reflux EGG normal 2015 Normal hydrogen breath test 2015 Normal gastric emptying scan 2015  Past Medical History:  Diagnosis Date  . Allergy   . Anemia   . Anxiety   . Arthritis   . Constipation   . GERD (gastroesophageal reflux disease)   . Hx of endometriosis   . Macromastia 07/2011  . Migraine     Past Surgical History:  Procedure Laterality Date  . ABDOMINAL HYSTERECTOMY  12/12/2003  . ADENOIDECTOMY  05/2011  . ANKLE ARTHROSCOPY  12/29/2007   right; with extensive debridement  . BLADDER NECK RECONSTRUCTION  01/14/2011   Procedure: BLADDER NECK REPAIR;  Surgeon: Reva Bores, MD;  Location: [facility];  Service: Gynecology;  Laterality: N/A;  Laparoscopic Repair of Incidental Cystotomy  . BREAST REDUCTION SURGERY  08/05/2011   Procedure: MAMMARY REDUCTION  (BREAST);  Surgeon: Louisa Second, MD;  Location: [facility];  Service: Plastics;  Laterality: Bilateral;  bilateral  . CESAREAN SECTION  12/29/2000; 1994  . DILATION AND CURETTAGE OF UTERUS  07/08/2003   open laparoscopy  . GIVENS CAPSULE STUDY N/A 06/23/2017   Procedure: GIVENS CAPSULE STUDY;  Surgeon: Benancio Deeds, MD;  Location: [facility];  Service: Gastroenterology;  Laterality: N/A;  . LAPAROSCOPIC SALPINGOOPHERECTOMY  01/14/2011   left  . REPAIR PERONEAL TENDONS ANKLE  01/03/2010    repair right subluxing peroneal tendons  . RESECTION DISTAL CLAVICAL Right 09/25/2017   Procedure: RESECTION DISTAL CLAVICAL;  Surgeon: Bjorn Pippin, MD;  Location: [facility];  Service: Orthopedics;  Laterality: Right;  . RIGHT OOPHORECTOMY     with lysis of adhesions  . rt. shoulder surgery    . SALPINGECTOMY  12/12/2003   right; with TAH  . SHOULDER ACROMIOPLASTY Right 09/25/2017   Procedure: SHOULDER ACROMIOPLASTY;  Surgeon: Bjorn Pippin, MD;  Location: [facility];  Service: Orthopedics;  Laterality: Right;  . SHOULDER ARTHROSCOPY WITH DEBRIDEMENT AND BICEP TENDON REPAIR Right 09/25/2017   Procedure: SHOULDER ARTHROSCOPY WITH DEBRIDEMENT AND BICEP TENDON REPAIR;  Surgeon: Bjorn Pippin, MD;  Location: [facility];  Service: Orthopedics;  Laterality: Right;  . SHOULDER ARTHROSCOPY WITH ROTATOR CUFF REPAIR Right 09/25/2017   Procedure: SHOULDER ARTHROSCOPY WITH ROTATOR CUFF REPAIR;  Surgeon: Bjorn Pippin, MD;  Location: [facility];  Service: Orthopedics;  Laterality: Right;  . TONSILLECTOMY  as a child  . TUBAL LIGATION  12/29/2000    Current Outpatient Medications  Medication Sig Dispense Refill  . acetaminophen (TYLENOL) 500 MG tablet Take 2 tablets (1,000 mg total) by mouth every 8 (eight) hours for 14 days. 84 tablet 0  . celecoxib (CELEBREX) 200 MG capsule Take 1 capsule (200 mg total) by mouth daily. (Patient taking differently: Take 200 mg by mouth daily as needed. ) 30 capsule 2  . esomeprazole (NEXIUM) 40 MG capsule TAKE 1 CAPSULE BY MOUTH EVERY MORNING (Patient taking differently: Take 40 mg by mouth in the morning) 90 capsule 1  . ferrous sulfate 325 (65 FE) MG EC tablet Take 1 tablet (325 mg total) by mouth 2 (two) times daily with a meal. (Patient taking differently: Take 325 mg by mouth 2 (two) times daily as needed. ) 60 tablet 3  . metoprolol tartrate (LOPRESSOR) 25 MG tablet 1/2 TABLET BY MOUTH AS NEEDED FOR FAST HEART  RATE/PALPITATIONS 15 tablet 3  . ondansetron (ZOFRAN) 4 MG tablet Take 1 tablet (4 mg total) by mouth every 8 (eight) hours as needed for up to 7 days for nausea or vomiting. 10 tablet 1  . zolpidem (AMBIEN) 5 MG tablet Take 1 tablet (5 mg total) by mouth at bedtime as needed. for sleep 30 tablet 0   No current facility-administered medications for this visit.     Allergies as of 10/02/2017  . (No Known Allergies)    Family History  Problem Relation Age of Onset  . Diabetes Mother   . Hypertension Mother   . Thyroid disease Sister   . Heart attack Maternal Grandmother   . Stroke Maternal Grandfather   . Colon cancer Neg Hx   . Colon polyps Neg Hx   . Esophageal cancer Neg Hx   . Rectal cancer Neg Hx   . Stomach cancer Neg Hx     Social History   Socioeconomic History  . Marital status: Divorced    Spouse name:  Not on file  . Number of children: 2  . Years of education: 12th  . Highest education level: Not on file  Occupational History    Employer: OTHER    Comment: [employer]  Social Needs  . Financial resource strain: Not on file  . Food insecurity:    Worry: Not on file    Inability: Not on file  . Transportation needs:    Medical: Not on file    Non-medical: Not on file  Tobacco Use  . Smoking status: Never Smoker  . Smokeless tobacco: Never Used  Substance and Sexual Activity  . Alcohol use: Yes    Comment: occasional   . Drug use: No  . Sexual activity: Not on file  Lifestyle  . Physical activity:    Days per week: Not on file    Minutes per session: Not on file  . Stress: Not on file  Relationships  . Social connections:    Talks on phone: Not on file    Gets together: Not on file    Attends religious service: Not on file    Active member of club or organization: Not on file    Attends meetings of clubs or organizations: Not on file    Relationship status: Not on file  . Intimate partner violence:    Fear of current or ex partner: Not on file     Emotionally abused: Not on file    Physically abused: Not on file    Forced sexual activity: Not on file  Other Topics Concern  . Not on file  Social History Narrative   Patient lives at home with family.   Caffeine Use: 1 cup daily    Review of Systems:    Constitutional: No weight loss, fever or chills Cardiovascular: No chest pain Respiratory: No SOB  Gastrointestinal: See HPI and otherwise negative   Physical Exam:  Vital signs: Ht  (1.626 m)   Wt 230 lb 6.4 oz (104.5 kg)   LMP 12/27/2003   BMI 39.55 kg/m   Constitutional:   Pleasant overweight AA female appears to be in NAD, Well developed, Well nourished, alert and cooperative Respiratory: Respirations even and unlabored. Lungs clear to auscultation bilaterally.   No wheezes, crackles, or rhonchi.  Cardiovascular: Normal S1, S2. No MRG. Regular rate and rhythm. No peripheral edema, cyanosis or pallor.  Gastrointestinal:  Soft, nondistended, moderate epigastric ttp, No rebound or guarding. Normal bowel sounds. No appreciable masses or hepatomegaly. Rectal:  Not performed.  Psychiatric: Demonstrates good judgement and reason without abnormal affect or behaviors.  MOST RECENT LABS AND IMAGING: CBC    Component Value Date/Time   WBC 5.3 07/14/2017 1515   WBC 4.9 06/02/2017 1531   RBC 4.81 07/14/2017 1515   RBC 4.70 06/02/2017 1531   HGB 12.1 07/14/2017 1515   HCT 37.2 07/14/2017 1515   PLT 340 07/14/2017 1515   MCV 77 (L) 07/14/2017 1515   MCH 25.2 (L) 07/14/2017 1515   MCH 24.1 (L) 03/14/2017 1912   MCHC 32.5 07/14/2017 1515   MCHC 32.3 06/02/2017 1531   RDW 16.0 (H) 07/14/2017 1515   LYMPHSABS 2.0 07/14/2017 1515   MONOABS 0.4 06/02/2017 1531   EOSABS 0.1 07/14/2017 1515   BASOSABS 0.0 07/14/2017 1515    CMP     Component Value Date/Time   NA 141 07/14/2017 1515   K 4.2 07/14/2017 1515   CL 102 07/14/2017 1515   CO2 26 07/14/2017 1515   GLUCOSE  82 07/14/2017 1515   GLUCOSE 100 (H) 03/14/2017 1912    BUN 6 07/14/2017 1515   CREATININE 0.86 07/14/2017 1515   CREATININE 0.77 12/23/2016 1531   CALCIUM 9.4 07/14/2017 1515   PROT 7.5 07/14/2017 1515   ALBUMIN 4.2 07/14/2017 1515   AST 20 07/14/2017 1515   ALT 18 07/14/2017 1515   ALKPHOS 74 07/14/2017 1515   BILITOT 0.3 07/14/2017 1515   GFRNONAA 84 07/14/2017 1515   GFRNONAA >89 12/23/2016 1531   GFRAA 97 07/14/2017 1515   GFRAA >89 12/23/2016 1531    Assessment: 1.  GERD: Chronic, never fully controlled, recent use of increased Ibuprofen 600 mg 4-5 times per day around time of shoulder surgery, question relation of symptoms to this, last EGD in October of last year; concern for PUD +/- chronic reflux and gastritis 2.  Nausea: Likely related to above  Plan: 1.  Continue Nexium 40 mg twice daily, 30-60 minutes before breakfast and dinner 2.  Prescribed Zantac 150 mg twice daily, every morning and nightly #60 with 3 refills 3.  Prescribed Carafate 1 g 4 times daily, 20-30 minutes before meals and at bedtime #120 with 2 refills 4.  Prescribed GI cocktail, 5-10 mL's every 4-6 hours as needed 5.  Reviewed antireflux diet and lifestyle modifications. 6.  My nurse will call and check on the patient in 2 weeks.  If she is not significantly better would recommend she have repeat EGD for further evaluation.   7.  Patient to follow in clinic with me or Dr. Adela Lank in 3-4 weeks or sooner if necessary.  Hyacinth Meeker, PA-C [facility] 10/02/2017, 3:38 PM  Cc: Bing Neighbors, FNP

## 2017-10-02 NOTE — Patient Instructions (Addendum)
If you are age 42 or younger, your body mass index should be between 19-25. Your Body mass index is 39.55 kg/m. If this is out of the aformentioned range listed, please consider follow up with your Primary Care Provider.  Continue Nexium 40 mg twice daily.  We sent prescriptions to [facility] [address].  1. Zantac 150 mg 2. Carafate 1 gram tablets.  3. The Gi cocktail prescription we will be faxing to [facility].   We made you an appointment with Dr. Adela Lank for 11-11-2017 at 10:30 am.

## 2017-10-02 NOTE — Addendum Note (Signed)
Addended byDerry Skill on: 10/02/2017 04:21 PM   Modules accepted: Orders

## 2017-10-03 ENCOUNTER — Ambulatory Visit: Payer: BLUE CROSS/BLUE SHIELD | Admitting: Nurse Practitioner

## 2017-10-03 DIAGNOSIS — M19011 Primary osteoarthritis, right shoulder: Secondary | ICD-10-CM | POA: Diagnosis not present

## 2017-10-03 NOTE — Progress Notes (Signed)
Agree with assessment and plan as outlined. Prior extensive workup including pH study for her reflux symptoms in 2015 time frame. That study showed normal Demeester score but a positive symptom index, thus she likely has a hypersensitive esophagus causing her symptoms. Agree with management as outlined. If symptoms persist would consider a TCA for hypersensitive esophagus if she hasn't tried that yet.

## 2017-10-08 DIAGNOSIS — M25511 Pain in right shoulder: Secondary | ICD-10-CM | POA: Diagnosis not present

## 2017-10-08 DIAGNOSIS — M7541 Impingement syndrome of right shoulder: Secondary | ICD-10-CM | POA: Diagnosis not present

## 2017-10-08 DIAGNOSIS — M6281 Muscle weakness (generalized): Secondary | ICD-10-CM | POA: Diagnosis not present

## 2017-10-08 DIAGNOSIS — M25611 Stiffness of right shoulder, not elsewhere classified: Secondary | ICD-10-CM | POA: Diagnosis not present

## 2017-10-10 DIAGNOSIS — M7541 Impingement syndrome of right shoulder: Secondary | ICD-10-CM | POA: Diagnosis not present

## 2017-10-10 DIAGNOSIS — M25511 Pain in right shoulder: Secondary | ICD-10-CM | POA: Diagnosis not present

## 2017-10-10 DIAGNOSIS — M25611 Stiffness of right shoulder, not elsewhere classified: Secondary | ICD-10-CM | POA: Diagnosis not present

## 2017-10-10 DIAGNOSIS — M6281 Muscle weakness (generalized): Secondary | ICD-10-CM | POA: Diagnosis not present

## 2017-10-14 DIAGNOSIS — M25611 Stiffness of right shoulder, not elsewhere classified: Secondary | ICD-10-CM | POA: Diagnosis not present

## 2017-10-14 DIAGNOSIS — M7541 Impingement syndrome of right shoulder: Secondary | ICD-10-CM | POA: Diagnosis not present

## 2017-10-14 DIAGNOSIS — M6281 Muscle weakness (generalized): Secondary | ICD-10-CM | POA: Diagnosis not present

## 2017-10-14 DIAGNOSIS — M25511 Pain in right shoulder: Secondary | ICD-10-CM | POA: Diagnosis not present

## 2017-10-16 DIAGNOSIS — M25611 Stiffness of right shoulder, not elsewhere classified: Secondary | ICD-10-CM | POA: Diagnosis not present

## 2017-10-16 DIAGNOSIS — M6281 Muscle weakness (generalized): Secondary | ICD-10-CM | POA: Diagnosis not present

## 2017-10-16 DIAGNOSIS — M25511 Pain in right shoulder: Secondary | ICD-10-CM | POA: Diagnosis not present

## 2017-10-16 DIAGNOSIS — M7541 Impingement syndrome of right shoulder: Secondary | ICD-10-CM | POA: Diagnosis not present

## 2017-10-17 ENCOUNTER — Other Ambulatory Visit: Payer: Self-pay | Admitting: Family Medicine

## 2017-10-17 DIAGNOSIS — G4700 Insomnia, unspecified: Secondary | ICD-10-CM

## 2017-10-17 MED ORDER — ZOLPIDEM TARTRATE 5 MG PO TABS: 5.0000 mg | ORAL_TABLET | Freq: Every evening | ORAL | 1 refills | Status: DC | PRN

## 2017-10-17 MED ORDER — ZOLPIDEM TARTRATE 5 MG PO TABS: 5.0000 mg | ORAL_TABLET | Freq: Every evening | ORAL | 0 refills | Status: DC | PRN

## 2017-10-17 NOTE — Progress Notes (Signed)
Meds ordered this encounter  Medications  . zolpidem (AMBIEN) 5 MG tablet    Sig: Take 1 tablet (5 mg total) by mouth at bedtime as needed. for sleep    Dispense:  30 tablet    Refill:  1    Order Specific Question:   Supervising Provider    Answer:   Quentin AngstJEGEDE, OLUGBEMIGA E [[id number]]     Nolon NationsLachina Moore Kamyra Schroeck  MSN, FNP-C Patient Care [facility] [address]  [place], [place] [id number] [phone]

## 2017-10-17 NOTE — Telephone Encounter (Signed)
Refill request for zolpidem. Please advise.  °

## 2017-10-20 DIAGNOSIS — M25611 Stiffness of right shoulder, not elsewhere classified: Secondary | ICD-10-CM | POA: Diagnosis not present

## 2017-10-20 DIAGNOSIS — M25511 Pain in right shoulder: Secondary | ICD-10-CM | POA: Diagnosis not present

## 2017-10-20 DIAGNOSIS — M6281 Muscle weakness (generalized): Secondary | ICD-10-CM | POA: Diagnosis not present

## 2017-10-20 DIAGNOSIS — M7541 Impingement syndrome of right shoulder: Secondary | ICD-10-CM | POA: Diagnosis not present

## 2017-10-22 DIAGNOSIS — M25511 Pain in right shoulder: Secondary | ICD-10-CM | POA: Diagnosis not present

## 2017-10-22 DIAGNOSIS — M7541 Impingement syndrome of right shoulder: Secondary | ICD-10-CM | POA: Diagnosis not present

## 2017-10-22 DIAGNOSIS — M6281 Muscle weakness (generalized): Secondary | ICD-10-CM | POA: Diagnosis not present

## 2017-10-22 DIAGNOSIS — M25611 Stiffness of right shoulder, not elsewhere classified: Secondary | ICD-10-CM | POA: Diagnosis not present

## 2017-10-30 DIAGNOSIS — M25661 Stiffness of right knee, not elsewhere classified: Secondary | ICD-10-CM | POA: Diagnosis not present

## 2017-10-30 DIAGNOSIS — M7541 Impingement syndrome of right shoulder: Secondary | ICD-10-CM | POA: Diagnosis not present

## 2017-10-30 DIAGNOSIS — M6281 Muscle weakness (generalized): Secondary | ICD-10-CM | POA: Diagnosis not present

## 2017-10-30 DIAGNOSIS — M25511 Pain in right shoulder: Secondary | ICD-10-CM | POA: Diagnosis not present

## 2017-11-03 DIAGNOSIS — M25611 Stiffness of right shoulder, not elsewhere classified: Secondary | ICD-10-CM | POA: Diagnosis not present

## 2017-11-03 DIAGNOSIS — M25511 Pain in right shoulder: Secondary | ICD-10-CM | POA: Diagnosis not present

## 2017-11-03 DIAGNOSIS — M7541 Impingement syndrome of right shoulder: Secondary | ICD-10-CM | POA: Diagnosis not present

## 2017-11-03 DIAGNOSIS — M6281 Muscle weakness (generalized): Secondary | ICD-10-CM | POA: Diagnosis not present

## 2017-11-05 DIAGNOSIS — M7541 Impingement syndrome of right shoulder: Secondary | ICD-10-CM | POA: Diagnosis not present

## 2017-11-05 DIAGNOSIS — M25611 Stiffness of right shoulder, not elsewhere classified: Secondary | ICD-10-CM | POA: Diagnosis not present

## 2017-11-05 DIAGNOSIS — M6281 Muscle weakness (generalized): Secondary | ICD-10-CM | POA: Diagnosis not present

## 2017-11-05 DIAGNOSIS — M25511 Pain in right shoulder: Secondary | ICD-10-CM | POA: Diagnosis not present

## 2017-11-11 ENCOUNTER — Ambulatory Visit (INDEPENDENT_AMBULATORY_CARE_PROVIDER_SITE_OTHER): Payer: BLUE CROSS/BLUE SHIELD | Admitting: Gastroenterology

## 2017-11-11 ENCOUNTER — Ambulatory Visit: Payer: BLUE CROSS/BLUE SHIELD | Admitting: Gastroenterology

## 2017-11-11 ENCOUNTER — Other Ambulatory Visit (INDEPENDENT_AMBULATORY_CARE_PROVIDER_SITE_OTHER): Payer: BLUE CROSS/BLUE SHIELD

## 2017-11-11 ENCOUNTER — Encounter: Payer: Self-pay | Admitting: Gastroenterology

## 2017-11-11 VITALS — BP 116/88 | HR 84 | Ht 64.0 in | Wt 230.5 lb

## 2017-11-11 DIAGNOSIS — K219 Gastro-esophageal reflux disease without esophagitis: Secondary | ICD-10-CM

## 2017-11-11 DIAGNOSIS — M79641 Pain in right hand: Secondary | ICD-10-CM | POA: Diagnosis not present

## 2017-11-11 DIAGNOSIS — M6281 Muscle weakness (generalized): Secondary | ICD-10-CM | POA: Diagnosis not present

## 2017-11-11 DIAGNOSIS — M25511 Pain in right shoulder: Secondary | ICD-10-CM | POA: Diagnosis not present

## 2017-11-11 DIAGNOSIS — R1110 Vomiting, unspecified: Secondary | ICD-10-CM

## 2017-11-11 DIAGNOSIS — M7541 Impingement syndrome of right shoulder: Secondary | ICD-10-CM | POA: Diagnosis not present

## 2017-11-11 DIAGNOSIS — D509 Iron deficiency anemia, unspecified: Secondary | ICD-10-CM | POA: Diagnosis not present

## 2017-11-11 DIAGNOSIS — M25611 Stiffness of right shoulder, not elsewhere classified: Secondary | ICD-10-CM | POA: Diagnosis not present

## 2017-11-11 LAB — CBC WITH DIFFERENTIAL/PLATELET
Basophils Absolute: 0.1 10*3/uL (ref 0.0–0.1)
Basophils Relative: 0.9 % (ref 0.0–3.0)
Eosinophils Absolute: 0.1 10*3/uL (ref 0.0–0.7)
Eosinophils Relative: 1.2 % (ref 0.0–5.0)
HCT: 37 % (ref 36.0–46.0)
Hemoglobin: 12.1 g/dL (ref 12.0–15.0)
Lymphocytes Relative: 35.2 % (ref 12.0–46.0)
Lymphs Abs: 2.1 10*3/uL (ref 0.7–4.0)
MCHC: 32.6 g/dL (ref 30.0–36.0)
MCV: 79.5 fl (ref 78.0–100.0)
Monocytes Absolute: 0.4 10*3/uL (ref 0.1–1.0)
Monocytes Relative: 6.5 % (ref 3.0–12.0)
Neutro Abs: 3.3 10*3/uL (ref 1.4–7.7)
Neutrophils Relative %: 56.2 % (ref 43.0–77.0)
Platelets: 297 10*3/uL (ref 150.0–400.0)
RBC: 4.65 Mil/uL (ref 3.87–5.11)
RDW: 13 % (ref 11.5–15.5)
WBC: 5.9 10*3/uL (ref 4.0–10.5)

## 2017-11-11 MED ORDER — AMBULATORY NON FORMULARY MEDICATION: 0 refills | Status: DC

## 2017-11-11 NOTE — Progress Notes (Signed)
HPI :  42 year old female known to me for history of reflux and iron deficiency anemia, here for reassessment.  She's had long-standing history of reflux symptoms and throat irritation. She has been seen by multiple ENTs for throat irritation. She's had an extensive evaluation by Dr. Alycia Rossetti was [facility] in 2015 as below. Most recently she had an upper endoscopy with me October which was normal other than a small hiatal hernia. She was seen by Hyacinth Meeker 10/02/17 and given nexium 40mg  BID, zantac 150mg  BID, carafate QID, GI cocktail. She states despite this regimen she continues to have regurgitation. This is her main complaint. She denies much of any pyrosis at all. She denies any dysphagia. She states shortly after swallowing food will regurgitate back up. Sometimes this can occur instantly, sometimes a concur after several minutes.She also states she feels full quite easily after eating small amounts. She does have some nausea but does not vomit. She is quite frustrated that the symptoms have been ongoing without relief. She did have an abnormal gastric emptying study in 2011 with Dr. Loreta Ave. She was given a trial of Reglan at that time and she states this caused twitching and she stopped it. She a subsequent gastric imaging study at [facility] in 2015 which was normal. Prior pH study in 2015 showed findings consistent with a hypersensitive esophagus and a normal DeMeester score.  Otherwise she states she has been compliant with her iron supplementation. Her last labs in March showed resolution of her anemia and normal iron stores. Since her last seen in clinic she had a capsule endoscopy done to complete her iron deficiency workup, this exam was normal. She endorses some constipation while being on iron, he is taking stool softeners or MiraLAX which does seem to help with this.  Recent workup: Capsule endoscopy done - 06/23/17 - complete study with good prep, no clear cause for iron  deficiency EGD 02/20/2017 - 2cm HH, otherwise normal exam, biopsies negative for HP Colonoscopy 02/20/2017 - focal mild erythema in the rectum - biopsied showed no evidence of colitis, otherwise normal colon and ileum  MRI liver / abdomen - 02/10/2017 - focal fatty infiltration, normal biliary tree, otherwise no pathology noted in the abdomen  Historic evaluation: Recent workup: CT scan neck 12/02/16 - normal CT angio of chest 12/02/2016 - normal EGD 08/19/2013 - Dr. Alycia Rossetti - normal esophagus, mild gastritis Esophageal manometry 2015 - normal PH study 2015 - normal exam, Demeester score of 5.7, positive symptom index for regurgitation and reflux EGG normal 2015 Normal hydrogen breath test 2015 Normal gastric emptying scan 2015 [facility] - delayed in 2011    Past Medical History:  Diagnosis Date  . Allergy   . Anemia   . Anxiety   . Arthritis   . Constipation   . GERD (gastroesophageal reflux disease)   . Hx of endometriosis   . Macromastia 07/2011  . Migraine      Past Surgical History:  Procedure Laterality Date  . ABDOMINAL HYSTERECTOMY  12/12/2003  . ADENOIDECTOMY  05/2011  . ANKLE ARTHROSCOPY  12/29/2007   right; with extensive debridement  . BLADDER NECK RECONSTRUCTION  01/14/2011   Procedure: BLADDER NECK REPAIR;  Surgeon: Reva Bores, MD;  Location: [facility];  Service: Gynecology;  Laterality: N/A;  Laparoscopic Repair of Incidental Cystotomy  . BREAST REDUCTION SURGERY  08/05/2011   Procedure: MAMMARY REDUCTION  (BREAST);  Surgeon: Louisa Second, MD;  Location: [facility];  Service: Plastics;  Laterality: Bilateral;  bilateral  . CESAREAN SECTION  12/29/2000; 1994  . DILATION AND CURETTAGE OF UTERUS  07/08/2003   open laparoscopy  . GIVENS CAPSULE STUDY N/A 06/23/2017   Procedure: GIVENS CAPSULE STUDY;  Surgeon: Benancio DeedsArmbruster, Lillion Elbert P, MD;  [facility];  Service: Gastroenterology;  Laterality: N/A;  . LAPAROSCOPIC SALPINGOOPHERECTOMY  01/14/2011   left  .  REPAIR PERONEAL TENDONS ANKLE  01/03/2010   repair right subluxing peroneal tendons  . RESECTION DISTAL CLAVICAL Right 09/25/2017   Procedure: RESECTION DISTAL CLAVICAL;  Surgeon: Bjorn PippinVarkey, Dax T, MD;  Location: [facility];  Service: Orthopedics;  Laterality: Right;  . RIGHT OOPHORECTOMY     with lysis of adhesions  . SHOULDER ACROMIOPLASTY Right 09/25/2017   Procedure: SHOULDER ACROMIOPLASTY;  Surgeon: Bjorn PippinVarkey, Dax T, MD;  Location: [facility];  Service: Orthopedics;  Laterality: Right;  . SHOULDER ARTHROSCOPY WITH DEBRIDEMENT AND BICEP TENDON REPAIR Right 09/25/2017   Procedure: SHOULDER ARTHROSCOPY WITH DEBRIDEMENT AND BICEP TENDON REPAIR;  Surgeon: Bjorn PippinVarkey, Dax T, MD;  Location: [facility];  Service: Orthopedics;  Laterality: Right;  . SHOULDER ARTHROSCOPY WITH ROTATOR CUFF REPAIR Right 09/25/2017   Procedure: SHOULDER ARTHROSCOPY WITH ROTATOR CUFF REPAIR;  Surgeon: Bjorn PippinVarkey, Dax T, MD;  Location: [facility];  Service: Orthopedics;  Laterality: Right;  . TONSILLECTOMY  as a child  . TUBAL LIGATION  12/29/2000   Family History  Problem Relation Age of Onset  . Diabetes Mother   . Hypertension Mother   . Thyroid disease Sister   . Heart attack Maternal Grandmother   . Stroke Maternal Grandfather   . Colon cancer Neg Hx   . Colon polyps Neg Hx   . Esophageal cancer Neg Hx   . Rectal cancer Neg Hx   . Stomach cancer Neg Hx    Social History   Tobacco Use  . Smoking status: Never Smoker  . Smokeless tobacco: Never Used  Substance Use Topics  . Alcohol use: Yes    Comment: occasional   . Drug use: No   Current Outpatient Medications  Medication Sig Dispense Refill  . celecoxib (CELEBREX) 200 MG capsule Take 1 capsule (200 mg total) by mouth daily. (Patient taking differently: Take 200 mg by mouth daily as needed. ) 30 capsule 2  . esomeprazole (NEXIUM) 40 MG capsule TAKE 1 CAPSULE BY MOUTH EVERY MORNING (Patient taking  differently: Take 40 mg by mouth in the morning) 90 capsule 1  . ferrous sulfate 325 (65 FE) MG EC tablet Take 1 tablet (325 mg total) by mouth 2 (two) times daily with a meal. (Patient taking differently: Take 325 mg by mouth 2 (two) times daily as needed. ) 60 tablet 3  . metoprolol tartrate (LOPRESSOR) 25 MG tablet 1/2 TABLET BY MOUTH AS NEEDED FOR FAST HEART RATE/PALPITATIONS 15 tablet 3  . ranitidine (ZANTAC) 150 MG tablet Take 1 tab twice daily every morning. 60 tablet 3  . sucralfate (CARAFATE) 1 g tablet Take 1 tab by mouth 4 times a day 20-30 min before meals and bedtime. 120 tablet 3  . zolpidem (AMBIEN) 5 MG tablet Take 1 tablet (5 mg total) by mouth at bedtime as needed. for sleep 30 tablet 1   No current facility-administered medications for this visit.    No Known Allergies   Review of Systems: All systems reviewed and negative except where noted in HPI.   Lab Results  Component Value Date   WBC 5.3 07/14/2017   HGB 12.1 07/14/2017  HCT 37.2 07/14/2017   MCV 77 (L) 07/14/2017   PLT 340 07/14/2017    Lab Results  Component Value Date   IRON 82 07/14/2017   TIBC 324 07/14/2017   FERRITIN 209 (H) 07/14/2017    CBC Latest Ref Rng & Units 07/14/2017 06/02/2017 04/16/2017  WBC 3.4 - 10.8 x10E3/uL 5.3 4.9 5.1  Hemoglobin 11.1 - 15.9 g/dL 51.8 11.7(L) 10.1(L)  Hematocrit 34.0 - 46.6 % 37.2 36.2 31.2(L)  Platelets 150 - 379 x10E3/uL 340 313.0 320.0   Lab Results  Component Value Date   CREATININE 0.86 07/14/2017   BUN 6 07/14/2017   NA 141 07/14/2017   K 4.2 07/14/2017   CL 102 07/14/2017   CO2 26 07/14/2017    Lab Results  Component Value Date   ALT 18 07/14/2017   AST 20 07/14/2017   ALKPHOS 74 07/14/2017   BILITOT 0.3 07/14/2017     Physical Exam: BP 116/88   Pulse 84   Ht 5\' 4"  (1.626 m)   Wt 230 lb 8 oz (104.6 kg)   LMP 12/27/2003   BMI 39.57 kg/m  Constitutional: Pleasant,well-developed, female in no acute distress. HEENT: Normocephalic and  atraumatic. Conjunctivae are normal. No scleral icterus. Neck supple.  Cardiovascular: Normal rate, regular rhythm.  Pulmonary/chest: Effort normal and breath sounds normal. No wheezing, rales or rhonchi. Abdominal: Soft, nondistended, nontender.  There are no masses palpable. No hepatomegaly. Extremities: no edema Lymphadenopathy: No cervical adenopathy noted. Neurological: Alert and oriented to person place and time. Skin: Skin is warm and dry. No rashes noted. Psychiatric: Normal mood and affect. Behavior is normal.   ASSESSMENT AND PLAN: 86 -year-old female here for reassessment of the following issues:  Regurgitation / GERD - her pyrosis has resolved with Nexium, Zantac, however she continues to have bothersome regurgitation. Difficult to pin down the timing of this, it can occur instantly which raises the possibility for rumination syndrome. Given her early satiety and history of prior abnormal gastric emptying study, gastroparesis is also possible. It otherwise possible she has just worsening nonacid reflux, however prior pH study in 2015 suggested that was not the case and more so hypersensitive esophagus. I discussed each of these entities with her. Given her early satiety and ongoing regurgitation of recommending we repeat a gastric emptying study given conflicting reports on her prior remote exams. If that is positive for gastroparesis we will treat it. If negative, we'll then proceed with repeating manometry along with a pH impedance test. Will see if there is any evidence of rumination on manometry (if we are lucky to catch it), assess motility / see the level of reflux she is experiencing. If her pH tests shows significant nonacid reflux I would consider referral for surgical therapy. If the pH test was normal, would consider trial of TCA and treatment for rumination. She agreed with the plan. In the interim we'll give her some FD Delene Ruffini to see if this helps some of her dyspepsia I  reassured her her endoscopy shows no evidence of Barrett's esophagus or concerning changes otherwise.  Iron deficiency anemia - status post endoscopy, colonoscopy, capsule endoscopy without a clear cause. She has responded to oral iron and her most recent blood count was stable in March. She is asking to reassess her anemia at this time to ensure stable. We'll repeat a CBC today. She should continue her iron as well as MiraLAX to prevent constipation while on iron.  Ileene Patrick, MD [facility]

## 2017-11-11 NOTE — Patient Instructions (Addendum)
If you are age 42 or older, your body mass index should be between 23-30. Your Body mass index is 39.57 kg/m. If this is out of the aforementioned range listed, please consider follow up with your Primary Care Provider.  If you are age 42 or younger, your body mass index should be between 19-25. Your Body mass index is 39.57 kg/m. If this is out of the aformentioned range listed, please consider follow up with your Primary Care Provider.    You have been scheduled for a gastric emptying scan at [facility] on Thursday, 11-13-17 at 7:30am. Please arrive at least 15 minutes prior to your appointment for registration. Please make certain not to have anything to eat or drink after midnight the night before your test. Hold all stomach medications (ex: Zofran, phenergan, Reglan) 48 hours prior to your test. If you need to reschedule your appointment, please contact radiology scheduling at [phone]. _____________________________________________________________________ A gastric-emptying study measures how long it takes for food to move through your stomach. There are several ways to measure stomach emptying. In the most common test, you eat food that contains a small amount of radioactive material. A scanner that detects the movement of the radioactive material is placed over your abdomen to monitor the rate at which food leaves your stomach. This test normally takes about 4 hours to complete. _________________________________________________________________  Please go to the lab in the basement of our building to have lab work done as you leave today.   We have given you samples of the following medication to take: FDgard - Take as directed.  Thank you for entrusting me with your care and for choosing [facility], Dr. Ileene PatrickSteven Armbruster

## 2017-11-13 ENCOUNTER — Ambulatory Visit (HOSPITAL_COMMUNITY)
Admission: RE | Admit: 2017-11-13 | Discharge: 2017-11-13 | Disposition: A | Discharge status: Home / Self Care | Payer: BLUE CROSS/BLUE SHIELD | Source: Ambulatory Visit | Attending: Gastroenterology | Admitting: Gastroenterology

## 2017-11-13 DIAGNOSIS — R112 Nausea with vomiting, unspecified: Secondary | ICD-10-CM | POA: Diagnosis not present

## 2017-11-13 DIAGNOSIS — K219 Gastro-esophageal reflux disease without esophagitis: Secondary | ICD-10-CM | POA: Insufficient documentation

## 2017-11-13 DIAGNOSIS — R1110 Vomiting, unspecified: Secondary | ICD-10-CM | POA: Diagnosis not present

## 2017-11-13 MED ORDER — TECHNETIUM TC 99M SULFUR COLLOID: 1.9000 | Freq: Once | INTRAVENOUS | Status: DC | PRN

## 2017-11-27 DIAGNOSIS — M6281 Muscle weakness (generalized): Secondary | ICD-10-CM | POA: Diagnosis not present

## 2017-11-27 DIAGNOSIS — M25611 Stiffness of right shoulder, not elsewhere classified: Secondary | ICD-10-CM | POA: Diagnosis not present

## 2017-11-27 DIAGNOSIS — M7541 Impingement syndrome of right shoulder: Secondary | ICD-10-CM | POA: Diagnosis not present

## 2017-11-27 DIAGNOSIS — M25511 Pain in right shoulder: Secondary | ICD-10-CM | POA: Diagnosis not present

## 2017-12-05 ENCOUNTER — Other Ambulatory Visit: Payer: Self-pay | Admitting: Cardiovascular Disease

## 2017-12-05 ENCOUNTER — Encounter: Payer: Self-pay | Admitting: Cardiovascular Disease

## 2017-12-09 DIAGNOSIS — M7541 Impingement syndrome of right shoulder: Secondary | ICD-10-CM | POA: Diagnosis not present

## 2017-12-09 DIAGNOSIS — M25511 Pain in right shoulder: Secondary | ICD-10-CM | POA: Diagnosis not present

## 2017-12-09 DIAGNOSIS — M6281 Muscle weakness (generalized): Secondary | ICD-10-CM | POA: Diagnosis not present

## 2017-12-09 DIAGNOSIS — M25611 Stiffness of right shoulder, not elsewhere classified: Secondary | ICD-10-CM | POA: Diagnosis not present

## 2017-12-10 ENCOUNTER — Ambulatory Visit: Payer: BLUE CROSS/BLUE SHIELD | Admitting: Cardiovascular Disease

## 2017-12-15 DIAGNOSIS — M25511 Pain in right shoulder: Secondary | ICD-10-CM | POA: Diagnosis not present

## 2017-12-15 DIAGNOSIS — M25611 Stiffness of right shoulder, not elsewhere classified: Secondary | ICD-10-CM | POA: Diagnosis not present

## 2017-12-15 DIAGNOSIS — M6281 Muscle weakness (generalized): Secondary | ICD-10-CM | POA: Diagnosis not present

## 2017-12-15 DIAGNOSIS — M7541 Impingement syndrome of right shoulder: Secondary | ICD-10-CM | POA: Diagnosis not present

## 2017-12-17 ENCOUNTER — Other Ambulatory Visit: Payer: Self-pay | Admitting: Family Medicine

## 2017-12-17 DIAGNOSIS — G4700 Insomnia, unspecified: Secondary | ICD-10-CM

## 2017-12-19 ENCOUNTER — Other Ambulatory Visit: Payer: Self-pay | Admitting: Family Medicine

## 2017-12-19 DIAGNOSIS — G4700 Insomnia, unspecified: Secondary | ICD-10-CM

## 2017-12-22 NOTE — Telephone Encounter (Signed)
Refill request for zolpidem. Please advise. Thanks!  °

## 2017-12-22 NOTE — Telephone Encounter (Signed)
Andre, Please advise if this can be refilled. Thanks!  

## 2017-12-23 ENCOUNTER — Encounter: Payer: Self-pay | Admitting: Family Medicine

## 2017-12-24 ENCOUNTER — Encounter: Payer: Self-pay | Admitting: Family Medicine

## 2017-12-24 ENCOUNTER — Ambulatory Visit (INDEPENDENT_AMBULATORY_CARE_PROVIDER_SITE_OTHER): Payer: BLUE CROSS/BLUE SHIELD | Admitting: Family Medicine

## 2017-12-24 VITALS — BP 134/82 | HR 62 | Temp 98.2°F | Ht 64.0 in | Wt 236.0 lb

## 2017-12-24 DIAGNOSIS — F419 Anxiety disorder, unspecified: Secondary | ICD-10-CM | POA: Diagnosis not present

## 2017-12-24 DIAGNOSIS — Z23 Encounter for immunization: Secondary | ICD-10-CM | POA: Diagnosis not present

## 2017-12-24 DIAGNOSIS — Z6841 Body Mass Index (BMI) 40.0 and over, adult: Secondary | ICD-10-CM

## 2017-12-24 DIAGNOSIS — K5900 Constipation, unspecified: Secondary | ICD-10-CM

## 2017-12-24 DIAGNOSIS — G4700 Insomnia, unspecified: Secondary | ICD-10-CM | POA: Diagnosis not present

## 2017-12-24 DIAGNOSIS — R5383 Other fatigue: Secondary | ICD-10-CM

## 2017-12-24 DIAGNOSIS — Z131 Encounter for screening for diabetes mellitus: Secondary | ICD-10-CM

## 2017-12-24 DIAGNOSIS — Z889 Allergy status to unspecified drugs, medicaments and biological substances status: Secondary | ICD-10-CM

## 2017-12-24 DIAGNOSIS — Z9189 Other specified personal risk factors, not elsewhere classified: Secondary | ICD-10-CM

## 2017-12-24 DIAGNOSIS — Z09 Encounter for follow-up examination after completed treatment for conditions other than malignant neoplasm: Secondary | ICD-10-CM

## 2017-12-24 DIAGNOSIS — E66813 Obesity, class 3: Secondary | ICD-10-CM

## 2017-12-24 LAB — POCT URINALYSIS DIP (MANUAL ENTRY)
Bilirubin, UA: NEGATIVE
Blood, UA: NEGATIVE
Glucose, UA: NEGATIVE mg/dL
Ketones, POC UA: NEGATIVE mg/dL
Nitrite, UA: NEGATIVE
Protein Ur, POC: NEGATIVE mg/dL
Spec Grav, UA: 1.015 (ref 1.010–1.025)
Urobilinogen, UA: 0.2 E.U./dL
pH, UA: 6.5 (ref 5.0–8.0)

## 2017-12-24 LAB — POCT GLYCOSYLATED HEMOGLOBIN (HGB A1C): Hemoglobin A1C: 5.1 % (ref 4.0–5.6)

## 2017-12-24 MED ORDER — ZOLPIDEM TARTRATE 5 MG PO TABS: 5.0000 mg | ORAL_TABLET | Freq: Every evening | ORAL | 1 refills | Status: DC | PRN

## 2017-12-24 MED ORDER — LORATADINE 10 MG PO TABS: 10.0000 mg | ORAL_TABLET | Freq: Every day | ORAL | 11 refills | Status: DC

## 2017-12-24 NOTE — Telephone Encounter (Signed)
[name] will see this patient on 02/24/2018. Will send to her for refill.

## 2017-12-24 NOTE — Progress Notes (Signed)
Follow Up  Subjective:    Patient ID: [name], female    DOB: March 07, 1976, 42 y.o.   MRN: [id number]  Chief Complaint  Patient presents with  . Follow-up    health maintence    HPI  [name] has a past medical history of  Migaines, Macromastia, GERD, Constipation, Arthritis, Anxiety, Anemia, and Allergies. She is here today for follow up.  Current Status: Since her last office visit, she is doing well. She has an appointment with Allergist on 01/09/2018, because she continues to have allergic reactions to foods. She has increased fatigue lately and finds it hard to walk moderate distances for exercise. She has not had any headaches, visual changes, dizziness, and falls. No chest pain, heart palpitations, cough and shortness of breath reported.  She denies fevers, chills, recent infections, weight loss, and night sweats.   No reports of GI problems such as nausea, vomiting, diarrhea, and constipation. She has no reports of blood in stools, dysuria and hematuria.   No depression or anxiety reported today.  She denies pain today.   Review of Systems  Constitutional: Negative.   HENT: Negative.   Eyes: Negative.   Respiratory: Positive for shortness of breath (on exertion).   Cardiovascular: Negative.   Gastrointestinal: Positive for abdominal distention (Obese) and constipation.  Endocrine: Negative.   Genitourinary: Negative.   Musculoskeletal: Positive for arthralgias (mild pain in right shoulder).  Skin: Negative.        itchy  Allergic/Immunologic: Negative.   Neurological: Negative.   Hematological: Negative.   Psychiatric/Behavioral: Negative.    Objective:   Physical Exam  Constitutional: She is oriented to person, place, and time. She appears well-developed and well-nourished.  HENT:  Head: Normocephalic and atraumatic.  Right Ear: External ear normal.  Left Ear: External ear normal.  Nose: Nose normal.  Mouth/Throat: Oropharynx is clear and moist.   Eyes: Pupils are equal, round, and reactive to light. Conjunctivae and EOM are normal.  Neck: Normal range of motion. Neck supple.  Cardiovascular: Normal rate, regular rhythm and normal heart sounds.  Pulmonary/Chest: Effort normal and breath sounds normal.  Abdominal: Soft. Bowel sounds are normal.  Musculoskeletal: Normal range of motion.  Neurological: She is alert and oriented to person, place, and time.  Skin: Skin is warm and dry. Capillary refill takes less than 2 seconds.  Psychiatric: She has a normal mood and affect.  Nursing note and vitals reviewed.  Assessment & Plan:   1. Insomnia, unspecified type We will refill Ambien today.  - zolpidem (AMBIEN) 5 MG tablet; Take 1 tablet (5 mg total) by mouth at bedtime as needed. for sleep  Dispense: 30 tablet; Refill: 1  2. Anxiety Stable.   3. Need for immunization against influenza Injection administrated in office today.  - Flu Vaccine QUAD 36+ mos IM  4. Fatigue, unspecified type - Vitamin B12 - Vitamin D, 25-hydroxy  5. H/O multiple allergies We will initiate Claritin today.  - loratadine (CLARITIN) 10 MG tablet; Take 1 tablet (10 mg total) by mouth daily.  Dispense: 30 tablet; Refill: 11  6. Class 3 severe obesity due to excess calories without serious comorbidity with body mass index (BMI) of 40.0 to 44.9 in adult [facility](HCC) Her BMI is currently 40.51 today. Her goal BMI is <25. Encouraged efforts to reduce weight include engaging in physical activity as tolerated with goal of 150 minutes per week. Improve dietary choices and eat a meal regimen consistent with a Mediterranean or DASH diet. Reduce  simple carbohydrates. Do not skip meals and eat healthy snacks throughout the day to avoid over-eating at dinner. Set a goal weight loss that is achievable for you.  7. Screening for diabetes mellitus Hgb A1c is normal at 5.1. She will continue to decrease foods/beverages high in sugars and carbs and follow Heart Healthy or DASH  diet. Increase physical activity to at least 30 minutes cardio exercise daily.   - POCT glycosylated hemoglobin (Hb A1C) - POCT urinalysis dipstick  8. Constipation, unspecified constipation type Stable. She will continue Miralax as needed. She will continue fluid intake.   9. Follow up She will follow up in 2 months.    Meds ordered this encounter  Medications  . zolpidem (AMBIEN) 5 MG tablet    Sig: Take 1 tablet (5 mg total) by mouth at bedtime as needed. for sleep    Dispense:  30 tablet    Refill:  1    Order Specific Question:   Supervising Provider    Answer:   Quentin AngstJEGEDE, OLUGBEMIGA E [id number]]  . loratadine (CLARITIN) 10 MG tablet    Sig: Take 1 tablet (10 mg total) by mouth daily.    Dispense:  30 tablet    Refill:  11    Raliegh IpNatalie Anuoluwapo Mefferd,  MSN, [facility] Health[facility]NP-C Patient [facility] [address]  [place], [place] [id number] [phone]

## 2017-12-24 NOTE — Patient Instructions (Addendum)
Heart-Healthy Eating Plan Heart-healthy meal planning includes:  Limiting unhealthy fats.  Increasing healthy fats.  Making other small dietary changes.  You may need to talk with your doctor or a diet specialist (dietitian) to create an eating plan that is right for you. What types of fat should I choose?  Choose healthy fats. These include olive oil and canola oil, flaxseeds, walnuts, almonds, and seeds.  Eat more omega-3 fats. These include salmon, mackerel, sardines, tuna, flaxseed oil, and ground flaxseeds. Try to eat fish at least twice each week.  Limit saturated fats. ? Saturated fats are often found in animal products, such as meats, butter, and cream. ? Plant sources of saturated fats include palm oil, palm kernel oil, and coconut oil.  Avoid foods with partially hydrogenated oils in them. These include stick margarine, some tub margarines, cookies, crackers, and other baked goods. These contain trans fats. What general guidelines do I need to follow?  Check food labels carefully. Identify foods with trans fats or high amounts of saturated fat.  Fill one half of your plate with vegetables and green salads. Eat 4-5 servings of vegetables per day. A serving of vegetables is: ? 1 cup of raw leafy vegetables. ?  cup of raw or cooked cut-up vegetables. ?  cup of vegetable juice.  Fill one fourth of your plate with whole grains. Look for the word "whole" as the first word in the ingredient list.  Fill one fourth of your plate with lean protein foods.  Eat 4-5 servings of fruit per day. A serving of fruit is: ? One medium whole fruit. ?  cup of dried fruit. ?  cup of fresh, frozen, or canned fruit. ?  cup of 100% fruit juice.  Eat more foods that contain soluble fiber. These include apples, broccoli, carrots, beans, peas, and barley. Try to get 20-30 g of fiber per day.  Eat more home-cooked food. Eat less restaurant, buffet, and fast food.  Limit or avoid  alcohol.  Limit foods high in starch and sugar.  Avoid fried foods.  Avoid frying your food. Try baking, boiling, grilling, or broiling it instead. You can also reduce fat by: ? Removing the skin from poultry. ? Removing all visible fats from meats. ? Skimming the fat off of stews, soups, and gravies before serving them. ? Steaming vegetables in water or broth.  Lose weight if you are overweight.  Eat 4-5 servings of nuts, legumes, and seeds per week: ? One serving of dried beans or legumes equals  cup after being cooked. ? One serving of nuts equals 1 ounces. ? One serving of seeds equals  ounce or one tablespoon.  You may need to keep track of how much salt or sodium you eat. This is especially true if you have high blood pressure. Talk with your doctor or dietitian to get more information. What foods can I eat? Grains Breads, including French, white, pita, wheat, raisin, rye, oatmeal, and Italian. Tortillas that are neither fried nor made with lard or trans fat. Low-fat rolls, including hotdog and hamburger buns and English muffins. Biscuits. Muffins. Waffles. Pancakes. Light popcorn. Whole-grain cereals. Flatbread. Melba toast. Pretzels. Breadsticks. Rusks. Low-fat snacks. Low-fat crackers, including oyster, saltine, matzo, graham, animal, and rye. Rice and pasta, including brown rice and pastas that are made with whole wheat. Vegetables All vegetables. Fruits All fruits, but limit coconut. Meats and Other Protein Sources Lean, well-trimmed beef, veal, pork, and lamb. Chicken and turkey without skin. All fish and shellfish.   Wild duck, rabbit, pheasant, and venison. Egg whites or low-cholesterol egg substitutes. Dried beans, peas, lentils, and tofu. Seeds and most nuts. Dairy Low-fat or nonfat cheeses, including ricotta, string, and mozzarella. Skim or 1% milk that is liquid, powdered, or evaporated. Buttermilk that is made with low-fat milk. Nonfat or low-fat  yogurt. Beverages Mineral water. Diet carbonated beverages. Sweets and Desserts Sherbets and fruit ices. Honey, jam, marmalade, jelly, and syrups. Meringues and gelatins. Pure sugar candy, such as hard candy, jelly beans, gumdrops, mints, marshmallows, and small amounts of dark chocolate. Angel food cake. Eat all sweets and desserts in moderation. Fats and Oils Nonhydrogenated (trans-free) margarines. Vegetable oils, including soybean, sesame, sunflower, olive, peanut, safflower, corn, canola, and cottonseed. Salad dressings or mayonnaise made with a vegetable oil. Limit added fats and oils that you use for cooking, baking, salads, and as spreads. Other Cocoa powder. Coffee and tea. All seasonings and condiments. The items listed above may not be a complete list of recommended foods or beverages. Contact your dietitian for more options. What foods are not recommended? Grains Breads that are made with saturated or trans fats, oils, or whole milk. Croissants. Butter rolls. Cheese breads. Sweet rolls. Donuts. Buttered popcorn. Chow mein noodles. High-fat crackers, such as cheese or butter crackers. Meats and Other Protein Sources Fatty meats, such as hotdogs, short ribs, sausage, spareribs, bacon, rib eye roast or steak, and mutton. High-fat deli meats, such as salami and bologna. Caviar. Domestic duck and goose. Organ meats, such as kidney, liver, sweetbreads, and heart. Dairy Cream, sour cream, cream cheese, and creamed cottage cheese. Whole-milk cheeses, including blue (bleu), Monterey Jack, Brie, [place], American, Havarti, Swiss, cheddar, Camembert, and [place]. Whole or 2% milk that is liquid, evaporated, or condensed. Whole buttermilk. Cream sauce or high-fat cheese sauce. Yogurt that is made from whole milk. Beverages Regular sodas and juice drinks with added sugar. Sweets and Desserts Frosting. Pudding. Cookies. Cakes other than angel food cake. Candy that has milk chocolate or white  chocolate, hydrogenated fat, butter, coconut, or unknown ingredients. Buttered syrups. Full-fat ice cream or ice cream drinks. Fats and Oils Gravy that has suet, meat fat, or shortening. Cocoa butter, hydrogenated oils, palm oil, coconut oil, palm kernel oil. These can often be found in baked products, candy, fried foods, nondairy creamers, and whipped toppings. Solid fats and shortenings, including bacon fat, salt pork, lard, and butter. Nondairy cream substitutes, such as coffee creamers and sour cream substitutes. Salad dressings that are made of unknown oils, cheese, or sour cream. The items listed above may not be a complete list of foods and beverages to avoid. Contact your dietitian for more information. This information is not intended to replace advice given to you by your health care provider. Make sure you discuss any questions you have with your health care provider. Document Released: 10/22/2011 Document Revised: 09/28/2015 Document Reviewed: 10/14/2013 Elsevier Interactive Patient Education  2018 [employer] DASH Eating Plan DASH stands for "Dietary Approaches to Stop Hypertension." The DASH eating plan is a healthy eating plan that has been shown to reduce high blood pressure (hypertension). It may also reduce your risk for type 2 diabetes, heart disease, and stroke. The DASH eating plan may also help with weight loss. What are tips for following this plan? General guidelines  Avoid eating more than 2,300 mg (milligrams) of salt (sodium) a day. If you have hypertension, you may need to reduce your sodium intake to 1,500 mg a day.  Limit alcohol intake to no more   than 1 drink a day for nonpregnant women and 2 drinks a day for men. One drink equals 12 oz of beer, 5 oz of wine, or 1 oz of hard liquor.  Work with your health care provider to maintain a healthy body weight or to lose weight. Ask what an ideal weight is for you.  Get at least 30 minutes of exercise that causes your  heart to beat faster (aerobic exercise) most days of the week. Activities may include walking, swimming, or biking.  Work with your health care provider or diet and nutrition specialist (dietitian) to adjust your eating plan to your individual calorie needs. Reading food labels  Check food labels for the amount of sodium per serving. Choose foods with less than 5 percent of the Daily Value of sodium. Generally, foods with less than 300 mg of sodium per serving fit into this eating plan.  To find whole grains, look for the word "whole" as the first word in the ingredient list. Shopping  Buy products labeled as "low-sodium" or "no salt added."  Buy fresh foods. Avoid canned foods and premade or frozen meals. Cooking  Avoid adding salt when cooking. Use salt-free seasonings or herbs instead of table salt or sea salt. Check with your health care provider or pharmacist before using salt substitutes.  Do not fry foods. Cook foods using healthy methods such as baking, boiling, grilling, and broiling instead.  Cook with heart-healthy oils, such as olive, canola, soybean, or sunflower oil. Meal planning   Eat a balanced diet that includes: ? 5 or more servings of fruits and vegetables each day. At each meal, try to fill half of your plate with fruits and vegetables. ? Up to 6-8 servings of whole grains each day. ? Less than 6 oz of lean meat, poultry, or fish each day. A 3-oz serving of meat is about the same size as a deck of cards. One egg equals 1 oz. ? 2 servings of low-fat dairy each day. ? A serving of nuts, seeds, or beans 5 times each week. ? Heart-healthy fats. Healthy fats called Omega-3 fatty acids are found in foods such as flaxseeds and coldwater fish, like sardines, salmon, and mackerel.  Limit how much you eat of the following: ? Canned or prepackaged foods. ? Food that is high in trans fat, such as fried foods. ? Food that is high in saturated fat, such as fatty  meat. ? Sweets, desserts, sugary drinks, and other foods with added sugar. ? Full-fat dairy products.  Do not salt foods before eating.  Try to eat at least 2 vegetarian meals each week.  Eat more home-cooked food and less restaurant, buffet, and fast food.  When eating at a restaurant, ask that your food be prepared with less salt or no salt, if possible. What foods are recommended? The items listed may not be a complete list. Talk with your dietitian about what dietary choices are best for you. Grains Whole-grain or whole-wheat bread. Whole-grain or whole-wheat pasta. Brown rice. Oatmeal. Quinoa. Bulgur. Whole-grain and low-sodium cereals. Pita bread. Low-fat, low-sodium crackers. Whole-wheat flour tortillas. Vegetables Fresh or frozen vegetables (raw, steamed, roasted, or grilled). Low-sodium or reduced-sodium tomato and vegetable juice. Low-sodium or reduced-sodium tomato sauce and tomato paste. Low-sodium or reduced-sodium canned vegetables. Fruits All fresh, dried, or frozen fruit. Canned fruit in natural juice (without added sugar). Meat and other protein foods Skinless chicken or turkey. Ground chicken or turkey. Pork with fat trimmed off. Fish and seafood. Egg   whites. Dried beans, peas, or lentils. Unsalted nuts, nut butters, and seeds. Unsalted canned beans. Lean cuts of beef with fat trimmed off. Low-sodium, lean deli meat. Dairy Low-fat (1%) or fat-free (skim) milk. Fat-free, low-fat, or reduced-fat cheeses. Nonfat, low-sodium ricotta or cottage cheese. Low-fat or nonfat yogurt. Low-fat, low-sodium cheese. Fats and oils Soft margarine without trans fats. Vegetable oil. Low-fat, reduced-fat, or light mayonnaise and salad dressings (reduced-sodium). Canola, safflower, olive, soybean, and sunflower oils. Avocado. Seasoning and other foods Herbs. Spices. Seasoning mixes without salt. Unsalted popcorn and pretzels. Fat-free sweets. What foods are not recommended? The items listed  may not be a complete list. Talk with your dietitian about what dietary choices are best for you. Grains Baked goods made with fat, such as croissants, muffins, or some breads. Dry pasta or rice meal packs. Vegetables Creamed or fried vegetables. Vegetables in a cheese sauce. Regular canned vegetables (not low-sodium or reduced-sodium). Regular canned tomato sauce and paste (not low-sodium or reduced-sodium). Regular tomato and vegetable juice (not low-sodium or reduced-sodium). Rosita FirePickles. Olives. Fruits Canned fruit in a light or heavy syrup. Fried fruit. Fruit in cream or butter sauce. Meat and other protein foods Fatty cuts of meat. Ribs. Fried meat. Tomasa BlaseBacon. Sausage. Bologna and other processed lunch meats. Salami. Fatback. Hotdogs. Bratwurst. Salted nuts and seeds. Canned beans with added salt. Canned or smoked fish. Whole eggs or egg yolks. Chicken or [place]key with skin. Dairy Whole or 2% milk, cream, and half-and-half. Whole or full-fat cream cheese. Whole-fat or sweetened yogurt. Full-fat cheese. Nondairy creamers. Whipped toppings. Processed cheese and cheese spreads. Fats and oils Butter. Stick margarine. Lard. Shortening. Ghee. Bacon fat. Tropical oils, such as coconut, palm kernel, or palm oil. Seasoning and other foods Salted popcorn and pretzels. Onion salt, garlic salt, seasoned salt, table salt, and sea salt. Worcestershire sauce. Tartar sauce. Barbecue sauce. Teriyaki sauce. Soy sauce, including reduced-sodium. Steak sauce. Canned and packaged gravies. Fish sauce. Oyster sauce. Cocktail sauce. Horseradish that you find on the shelf. Ketchup. Mustard. Meat flavorings and tenderizers. Bouillon cubes. Hot sauce and Tabasco sauce. Premade or packaged marinades. Premade or packaged taco seasonings. Relishes. Regular salad dressings. Where to find more information:  National Heart, Lung, and Blood Institute: PopSteam.iswww.nhlbi.nih.gov  American Heart Association: www.heart.org Summary  The DASH  eating plan is a healthy eating plan that has been shown to reduce high blood pressure (hypertension). It may also reduce your risk for type 2 diabetes, heart disease, and stroke.  With the DASH eating plan, you should limit salt (sodium) intake to 2,300 mg a day. If you have hypertension, you may need to reduce your sodium intake to 1,500 mg a day.  When on the DASH eating plan, aim to eat more fresh fruits and vegetables, whole grains, lean proteins, low-fat dairy, and heart-healthy fats.  Work with your health care provider or diet and nutrition specialist (dietitian) to adjust your eating plan to your individual calorie needs. This information is not intended to replace advice given to you by your health care provider. Make sure you discuss any questions you have with your health care provider. Document Released: 04/11/2011 Document Revised: 04/15/2016 Document Reviewed: 04/15/2016 Elsevier Interactive Patient Education  2018 [employer]   Loratadine tablets What is this medicine? LORATADINE (lor AT a deen) is an antihistamine. It helps to relieve sneezing, runny nose, and itchy, watery eyes. This medicine is used to treat the symptoms of allergies. It is also used to treat itchy skin rash and hives. This medicine may  be used for other purposes; ask your health care provider or pharmacist if you have questions. COMMON BRAND NAME(S): Alavert, Allergy Relief, Claritin, Claritin Hives Relief, Clear-Atadine, QlearQuil All Day & All Night Allergy Relief, Tavist ND What should I tell my health care provider before I take this medicine? They need to know if you have any of these conditions: -asthma -kidney disease -liver disease -an unusual or allergic reaction to loratadine, other antihistamines, other medicines, foods, dyes, or preservatives -pregnant or trying to get pregnant -breast-feeding How should I use this medicine? Take this medicine by mouth with a glass of water. Follow the  directions on the label. You may take this medicine with food or on an empty stomach. Take your medicine at regular intervals. Do not take your medicine more often than directed. Talk to your pediatrician regarding the use of this medicine in children. While this medicine may be used in children as young as 6 years for selected conditions, precautions do apply. Overdosage: If you think you have taken too much of this medicine contact a poison control center or emergency room at once. NOTE: This medicine is only for you. Do not share this medicine with others. What if I miss a dose? If you miss a dose, take it as soon as you can. If it is almost time for your next dose, take only that dose. Do not take double or extra doses. What may interact with this medicine? -other medicines for colds or allergies This list may not describe all possible interactions. Give your health care provider a list of all the medicines, herbs, non-prescription drugs, or dietary supplements you use. Also tell them if you smoke, drink alcohol, or use illegal drugs. Some items may interact with your medicine. What should I watch for while using this medicine? Tell your doctor or healthcare professional if your symptoms do not start to get better or if they get worse. Your mouth may get dry. Chewing sugarless gum or sucking hard candy, and drinking plenty of water may help. Contact your doctor if the problem does not go away or is severe. You may get drowsy or dizzy. Do not drive, use machinery, or do anything that needs mental alertness until you know how this medicine affects you. Do not stand or sit up quickly, especially if you are an older patient. This reduces the risk of dizzy or fainting spells. What side effects may I notice from receiving this medicine? Side effects that you should report to your doctor or health care professional as soon as possible: -allergic reactions like skin rash, itching or hives, swelling of the  face, lips, or tongue -breathing problems -unusually restless or nervous Side effects that usually do not require medical attention (report to your doctor or health care professional if they continue or are bothersome): -drowsiness -dry or irritated mouth or throat -headache This list may not describe all possible side effects. Call your doctor for medical advice about side effects. You may report side effects to FDA at 1-800-FDA-1088. Where should I keep my medicine? Keep out of the reach of children. Store at room temperature between 2 and 30 degrees C (36 and 86 degrees F). Protect from moisture. Throw away any unused medicine after the expiration date. NOTE: This sheet is a summary. It may not cover all possible information. If you have questions about this medicine, talk to your doctor, pharmacist, or health care provider.  2018 Elsevier/Gold Standard (2007-10-26 17:17:24)

## 2017-12-25 ENCOUNTER — Other Ambulatory Visit: Payer: Self-pay | Admitting: Family Medicine

## 2017-12-25 DIAGNOSIS — E559 Vitamin D deficiency, unspecified: Secondary | ICD-10-CM

## 2017-12-25 LAB — VITAMIN B12: Vitamin B-12: 725 pg/mL (ref 232–1245)

## 2017-12-25 LAB — VITAMIN D 25 HYDROXY (VIT D DEFICIENCY, FRACTURES): Vit D, 25-Hydroxy: 13.7 ng/mL — ABNORMAL LOW (ref 30.0–100.0)

## 2017-12-25 MED ORDER — VITAMIN D 1000 UNITS PO TABS: 1000.0000 [IU] | ORAL_TABLET | Freq: Every day | ORAL | 0 refills | Status: AC

## 2017-12-25 NOTE — Telephone Encounter (Signed)
-----   Message from Natalie M Stroud, FNP sent at 12/25/2017 10:20 AM EDT ----- Regarding: "Lab Results" Carrie,   Please inform patient that she has a low Vitamin D level. We recommend that she takes a OTC Vitamin D supplement of 800-1000 IUs daily to increase vitamin D levels. We wukk   Her Vitamin B-12 levels are within normal range.   Thank you.      

## 2017-12-25 NOTE — Progress Notes (Signed)
Vitamin D levels are decreased. Patient will begin [phone] IUs daily.

## 2017-12-25 NOTE — Telephone Encounter (Signed)
-----   Message from Kallie LocksNatalie M Stroud, FNP sent at 12/25/2017 10:20 AM EDT ----- Regarding: "Lab Results" Lyla Sonarrie,   Please inform patient that she has a low Vitamin D level. We recommend that she takes a OTC Vitamin D supplement of [phone] IUs daily to increase vitamin D levels. We wukk   Her Vitamin B-12 levels are within normal range.   Thank you.

## 2017-12-29 DIAGNOSIS — M25611 Stiffness of right shoulder, not elsewhere classified: Secondary | ICD-10-CM | POA: Diagnosis not present

## 2017-12-29 DIAGNOSIS — M25511 Pain in right shoulder: Secondary | ICD-10-CM | POA: Diagnosis not present

## 2017-12-29 DIAGNOSIS — M6281 Muscle weakness (generalized): Secondary | ICD-10-CM | POA: Diagnosis not present

## 2017-12-29 DIAGNOSIS — M7541 Impingement syndrome of right shoulder: Secondary | ICD-10-CM | POA: Diagnosis not present

## 2017-12-30 ENCOUNTER — Telehealth: Payer: Self-pay

## 2017-12-30 NOTE — Telephone Encounter (Signed)
Patient had some results sent from a GI study she had done at another facility and wondered if she still needs to have the EM, 24 pH probe. Dr. Adela LankArmbruster reviewed results and if she is still have persistent symptoms then recommends proceeding with tests. Patient is advised of this and will continue on with planned testing. Results sent to be scanned.

## 2018-01-09 ENCOUNTER — Ambulatory Visit (INDEPENDENT_AMBULATORY_CARE_PROVIDER_SITE_OTHER): Payer: BLUE CROSS/BLUE SHIELD | Admitting: Allergy

## 2018-01-09 ENCOUNTER — Encounter: Payer: Self-pay | Admitting: Allergy

## 2018-01-09 VITALS — BP 126/74 | HR 72 | Temp 97.6°F | Resp 20 | Ht 64.0 in | Wt 238.0 lb

## 2018-01-09 DIAGNOSIS — H1010 Acute atopic conjunctivitis, unspecified eye: Secondary | ICD-10-CM | POA: Diagnosis not present

## 2018-01-09 DIAGNOSIS — T781XXA Other adverse food reactions, not elsewhere classified, initial encounter: Secondary | ICD-10-CM | POA: Diagnosis not present

## 2018-01-09 DIAGNOSIS — J309 Allergic rhinitis, unspecified: Secondary | ICD-10-CM

## 2018-01-09 DIAGNOSIS — R062 Wheezing: Secondary | ICD-10-CM

## 2018-01-09 MED ORDER — OLOPATADINE HCL 0.2 % OP SOLN: 1.0000 [drp] | Freq: Every day | OPHTHALMIC | 5 refills | Status: DC | PRN

## 2018-01-09 MED ORDER — EPINEPHRINE 0.3 MG/0.3ML IJ SOAJ: 0.3000 mg | Freq: Once | INTRAMUSCULAR | 1 refills | Status: DC | PRN

## 2018-01-09 MED ORDER — CETIRIZINE HCL 10 MG PO TABS: 10.0000 mg | ORAL_TABLET | Freq: Every day | ORAL | 5 refills | Status: DC

## 2018-01-09 NOTE — Progress Notes (Signed)
New Patient Note  RE: [name] MRN: [id number] DOB: 1975-06-25 Date of Office Visit: 01/09/2018  Referring provider: Kallie Locks, FNP Primary care provider: Kallie Locks, FNP  Chief Complaint: reaction to foods  History of present illness: [name] is a 42 y.o. female presenting today for consultation for food reactions.    She states she has been having reactions to certain foods now for about the past year.      PB - she states if she eats regular PB about 30 minutes later her throat starts hurting, chest feels tight and overall feels "bad" and becomes "out of breath" and also has noticed tongue tingling.   She states if she eats a reese's candy she states symptoms are "not bad as eating straight PB" but has noted that her throat will hurt.   She states symptoms started about 2 weeks ago for the first time.    Fish or shrimp - she is not sure which one causes symptoms as she usually eats fish and shrimp together.  About 45 minutes after ingestion she has noted throat hurts and becomes "out of breath" and chest feels tight and states she was wheezing.  Reaction is similar to PB.    Spicy food - like spicy chips (Kettle jalapeno chips) she has noted similar reaction to PB.    Her first reaction was after eating a salad with a [place] with bacon salad dressing about a year ago. She states the salad consists of lettuce, tomato, dried onion, croutons.  She states she has been to ED twice in the last year and required breathing treatments due to wheezing both visits. She recalls getting benadryl in the ED as well.  She states she was prescribed albuterol inhaler but does not feel like it helps her symptoms when they occur with these reactions.     She states she has had allergy testing about 8 years ago and recalls being told she was allergic to pollens and states she was recommended to perform allergy shots.  She states after one of the injections she had a  reaction to it and thus discontinued. She does report having itchy watery eyes, sneezing, nasal congestion and drainage.  She takes claritin daily now for years.    She also states she breaks out in little itchy bumps about couple times a month.      Denies history of asthma.    She has a long-standing history of reflux symptoms and does follow with GI at [facility] gastroenterology.  She has had endoscopies which have shown small hiatal hernia.  She has been on Nexium and Zantac.  She also has long-standing history of complaints of throat irritation and has been seen by multiple ENTs for this complaint.  Most recent was Dr. Jenne Pane at [facility] in May 2019 where she had laryngoscopy showing "normal nasal passages, no mass or abnormality in the nasopharynx, and no mass or ulceration in the pharynx or larynx. Pyriform sinuses are open. Secretions are minimal. Vocal folds are without mass, scarring, or ulceration. The vocal folds adduct and abduct symmetrically. There is good glottal closure. Muscle tension patterns are not present. Laryngeal edema is minimal with slight posterior commissure hypertrophy."  Review of systems: Review of Systems  Constitutional: Negative for chills, fever and malaise/fatigue.  HENT: Negative for congestion, ear discharge, ear pain, nosebleeds and sore throat.   Eyes: Negative for pain, discharge and redness.  Respiratory: Negative for cough, shortness of  breath and wheezing.   Cardiovascular: Negative for chest pain.  Gastrointestinal: Positive for heartburn. Negative for abdominal pain, constipation, diarrhea, nausea and vomiting.  Musculoskeletal: Negative for joint pain.  Skin: Negative for itching and rash.  Neurological: Negative for headaches.    All other systems negative unless noted above in HPI  Past medical history: Past Medical History:  Diagnosis Date  . Allergy   . Anemia   . Angio-edema   . Anxiety   . Arthritis   . Constipation   . GERD  (gastroesophageal reflux disease)   . Hx of endometriosis   . Macromastia 07/2011  . Migraine   . Urticaria     Past surgical history: Past Surgical History:  Procedure Laterality Date  . ABDOMINAL HYSTERECTOMY  12/12/2003  . ADENOIDECTOMY  05/2011  . ANKLE ARTHROSCOPY  12/29/2007   right; with extensive debridement  . BLADDER NECK RECONSTRUCTION  01/14/2011   Procedure: BLADDER NECK REPAIR;  Surgeon: Reva Bores, MD;  Location: [facility];  Service: Gynecology;  Laterality: N/A;  Laparoscopic Repair of Incidental Cystotomy  . BREAST REDUCTION SURGERY  08/05/2011   Procedure: MAMMARY REDUCTION  (BREAST);  Surgeon: Louisa Second, MD;  Location: [facility];  Service: Plastics;  Laterality: Bilateral;  bilateral  . CESAREAN SECTION  12/29/2000; 1994  . DILATION AND CURETTAGE OF UTERUS  07/08/2003   open laparoscopy  . GIVENS CAPSULE STUDY N/A 06/23/2017   Procedure: GIVENS CAPSULE STUDY;  Surgeon: Benancio Deeds, MD;  Location: [facility];  Service: Gastroenterology;  Laterality: N/A;  . LAPAROSCOPIC SALPINGOOPHERECTOMY  01/14/2011   left  . REPAIR PERONEAL TENDONS ANKLE  01/03/2010   repair right subluxing peroneal tendons  . RESECTION DISTAL CLAVICAL Right 09/25/2017   Procedure: RESECTION DISTAL CLAVICAL;  Surgeon: Bjorn Pippin, MD;  Location: [facility];  Service: Orthopedics;  Laterality: Right;  . RIGHT OOPHORECTOMY     with lysis of adhesions  . SHOULDER ACROMIOPLASTY Right 09/25/2017   Procedure: SHOULDER ACROMIOPLASTY;  Surgeon: Bjorn Pippin, MD;  Location: [facility];  Service: Orthopedics;  Laterality: Right;  . SHOULDER ARTHROSCOPY WITH DEBRIDEMENT AND BICEP TENDON REPAIR Right 09/25/2017   Procedure: SHOULDER ARTHROSCOPY WITH DEBRIDEMENT AND BICEP TENDON REPAIR;  Surgeon: Bjorn Pippin, MD;  Location: [facility];  Service: Orthopedics;  Laterality: Right;  . SHOULDER ARTHROSCOPY WITH ROTATOR CUFF REPAIR Right 09/25/2017    Procedure: SHOULDER ARTHROSCOPY WITH ROTATOR CUFF REPAIR;  Surgeon: Bjorn Pippin, MD;  Location: [facility];  Service: Orthopedics;  Laterality: Right;  . TONSILLECTOMY  as a child  . TUBAL LIGATION  12/29/2000    Family history:  Family History  Problem Relation Age of Onset  . Diabetes Mother   . Hypertension Mother   . Thyroid disease Sister   . Heart attack Maternal Grandmother   . Stroke Maternal Grandfather   . Colon cancer Neg Hx   . Colon polyps Neg Hx   . Esophageal cancer Neg Hx   . Rectal cancer Neg Hx   . Stomach cancer Neg Hx     Social history: Lives in an apartment with no pets.  There is no concern for water damage, mildew or roaches in the home.  She is a [employer] doctor.  She denies a smoking history.    Medication List: Allergies as of 01/09/2018   No Known Allergies     Medication List        Accurate as  of 01/09/18  1:45 PM. Always use your most recent med list.          cholecalciferol 1000 units tablet Commonly known as:  VITAMIN D Take 1 tablet (1,000 Units total) by mouth daily.   esomeprazole 40 MG capsule Commonly known as:  NEXIUM TAKE 1 CAPSULE BY MOUTH EVERY MORNING   ferrous sulfate 325 (65 FE) MG EC tablet Take 1 tablet (325 mg total) by mouth 2 (two) times daily with a meal.   loratadine 10 MG tablet Commonly known as:  CLARITIN Take 1 tablet (10 mg total) by mouth daily.   polyethylene glycol packet Commonly known as:  MIRALAX / GLYCOLAX Take 17 g by mouth daily as needed.   ranitidine 150 MG tablet Commonly known as:  ZANTAC Take 1 tab twice daily every morning.   zolpidem 5 MG tablet Commonly known as:  AMBIEN Take 1 tablet (5 mg total) by mouth at bedtime as needed. for sleep       Known medication allergies: No Known Allergies   Physical examination: Blood pressure 126/74, pulse 72, temperature 97.6 F (36.4 C), temperature source Oral, resp. rate 20, height 5\' 4"  (1.626 m), weight 238 lb (108 kg),  last menstrual period 12/27/2003, SpO2 98 %.  General: Alert, interactive, in no acute distress. HEENT: PERRLA, TMs pearly gray, turbinates mildly edematous without discharge, post-pharynx non erythematous. Neck: Supple without lymphadenopathy. Lungs: Clear to auscultation without wheezing, rhonchi or rales. {no increased work of breathing. CV: Normal S1, S2 without murmurs. Abdomen: Nondistended, nontender. Skin: Warm and dry, without lesions or rashes. Extremities:  No clubbing, cyanosis or edema. Neuro:   Grossly intact.  Diagnositics/Labs:  Spirometry: FEV1: 2.17L 86%, FVC: 2.57L 84%, ratio consistent with nonobstructive pattern  Allergy testing: environmental allergy skin prick testing is positive to box elder, dust mites and cockroach.   Select food allergy skin prick testing was positive to salmon Allergy testing results were read and interpreted by provider, documented by clinical staff.   Assessment and plan:   Adverse food reactions   - skin testing today to foods in diet is positive to salmon.     - will obtain serum IgE levels to negative foods to ensure you have no sensitivities   - continue avoidance of fish at this time as well as peanut products and shellfish until labs return   - have access to self-injectable epinephrine (Epipen or AuviQ) 0.3mg  at all times   - have access to albuterol inhaler 2 puffs every 4-6 hours as needed for cough/wheeze/shortness of breath/chest tightness.  Monitor frequency of use.  Use with spacer (provided today)   - follow emergency action plan in case of allergic reaction  Allergic rhinoconjunctivitis   - environmental allergy skin prick testing is positive to tree pollen, dust mites and cockroach.  Will obtain environmental panel to determine if you have any more environmental sensitivities   - stop Claritin.  Change to Zyrtec 10mg  daily   - for itchy/watery/red eyes use Pazeo or Pataday 1 drop each eye daily as needed   - for nasal  congestion/drainage use OTC Flonase, Rhinocort or Nasacort 2 sprays each nostril daily as needed.  Use for 1-2 weeks at a time before stopping once symptoms improve.    Hives   - monitor how frequently you are having outbreaks   - if taking Zyrtec daily and having hives then increase to zyrtec 10mg  twice a day dosing   Reflux   - continue nexium and zantac  as directed   Follow-up 3-4 months or sooner if needed  I appreciate the opportunity to take part in [name]'s care. Please do not hesitate to contact me with questions.  Sincerely,   Margo Aye, MD Allergy/Immunology [facility]

## 2018-01-09 NOTE — Patient Instructions (Addendum)
Adverse food reactions   - skin testing today to foods in diet is positive to salmon.     - will obtain serum IgE levels to negative foods to ensure you have no sensitivities   - continue avoidance of fish at this time as well as peanut products and shellfish until labs return   - have access to self-injectable epinephrine (Epipen or AuviQ) 0.3mg  at all times   - have access to albuterol inhaler 2 puffs every 4-6 hours as needed for cough/wheeze/shortness of breath/chest tightness.  Monitor frequency of use.  Use with spacer (provided today)   - follow emergency action plan in case of allergic reaction  Allergic rhinoconjunctivitis   - environmental allergy skin prick testing is positive to tree pollen, dust mites and cockroach.  Will obtain environmental panel to determine if you have any more environmental sensitivities   - stop Claritin.  Change to Zyrtec 10mg  daily   - for itchy/watery/red eyes use Pazeo or Pataday 1 drop each eye daily as needed   - for nasal congestion/drainage use OTC Flonase, Rhinocort or Nasacort 2 sprays each nostril daily as needed.  Use for 1-2 weeks at a time before stopping once symptoms improve.    Hives   - monitor how frequently you are having outbreaks   - if taking Zyrtec daily and having hives then increase to zyrtec 10mg  twice a day dosing   Reflux   - continue nexium and zantac as directed   Follow-up 3-4 months or sooner if needed

## 2018-01-12 ENCOUNTER — Encounter (HOSPITAL_COMMUNITY)
Admission: RE | Disposition: A | Discharge status: Home / Self Care | Payer: Self-pay | Source: Ambulatory Visit | Attending: Gastroenterology

## 2018-01-12 ENCOUNTER — Ambulatory Visit (HOSPITAL_COMMUNITY)
Admission: RE | Admit: 2018-01-12 | Discharge: 2018-01-12 | Disposition: A | Discharge status: Home / Self Care | Payer: BLUE CROSS/BLUE SHIELD | Source: Ambulatory Visit | Attending: Gastroenterology | Admitting: Gastroenterology

## 2018-01-12 DIAGNOSIS — R0789 Other chest pain: Secondary | ICD-10-CM | POA: Diagnosis not present

## 2018-01-12 DIAGNOSIS — R079 Chest pain, unspecified: Secondary | ICD-10-CM | POA: Insufficient documentation

## 2018-01-12 DIAGNOSIS — R1110 Vomiting, unspecified: Secondary | ICD-10-CM

## 2018-01-12 DIAGNOSIS — K219 Gastro-esophageal reflux disease without esophagitis: Secondary | ICD-10-CM | POA: Diagnosis not present

## 2018-01-12 HISTORY — PX: ESOPHAGEAL MANOMETRY: SHX5429

## 2018-01-12 HISTORY — PX: PH IMPEDANCE STUDY: SHX5565

## 2018-01-12 HISTORY — PX: 24 HOUR PH STUDY: SHX5419

## 2018-01-12 LAB — ALLERGEN PROFILE, FOOD-FISH
Allergen Mackerel IgE: <0.1 kU/L
Allergen Salmon IgE: <0.1 kU/L
Allergen Trout IgE: <0.1 kU/L
Allergen Walley Pike IgE: <0.1 kU/L
Codfish IgE: <0.1 kU/L
Halibut IgE: <0.1 kU/L
Tuna: <0.1 kU/L

## 2018-01-12 LAB — ALLERGEN, ONION, F48: Allergen Onion IgE: <0.1 kU/L

## 2018-01-12 LAB — ALLERGEN WATERMELON: Allergen Watermelon IgE: <0.1 kU/L

## 2018-01-12 LAB — ALLERGENS W/TOTAL IGE AREA 2

## 2018-01-12 LAB — ALLERGENS(7)
Brazil Nut IgE: <0.1 kU/L
F020-IgE Almond: <0.1 kU/L
F202-IgE Cashew Nut: <0.1 kU/L
Hazelnut (Filbert) IgE: <0.1 kU/L
Peanut IgE: <0.1 kU/L
Pecan Nut IgE: <0.1 kU/L
Walnut IgE: <0.1 kU/L

## 2018-01-12 LAB — ALLERGEN CHOCOLATE: Chocolate/Cacao IgE: <0.1 kU/L

## 2018-01-12 LAB — ALLERGEN, CORN F8: Allergen Corn, IgE: <0.1 kU/L

## 2018-01-12 LAB — ALLERGEN, WHITE POTATO,F35: Allergen Potato, White IgE: <0.1 kU/L

## 2018-01-12 LAB — ALLERGEN PROFILE, SHELLFISH
Clam IgE: <0.1 kU/L
F023-IgE Crab: <0.1 kU/L
F080-IgE Lobster: <0.1 kU/L
F290-IgE Oyster: <0.1 kU/L
Scallop IgE: <0.1 kU/L
Shrimp IgE: <0.1 kU/L

## 2018-01-12 LAB — ALLERGEN, RICE, F9: Allergen Rice IgE: <0.1 kU/L

## 2018-01-12 LAB — F245-IGE EGG, WHOLE: Egg, Whole IgE: <0.1 kU/L

## 2018-01-12 LAB — ALLERGEN, GINGER, RF270: Allergen Ginger IgE: <0.1 kU/L

## 2018-01-12 LAB — ALLERGEN, PINEAPPLE, F210: Pineapple IgE: <0.1 kU/L

## 2018-01-12 LAB — ALLERGEN SOYBEAN: Soybean IgE: <0.1 kU/L

## 2018-01-12 LAB — ALLERGEN, MUSHROOM, RF212: Mushroom IgE: <0.1 kU/L

## 2018-01-12 LAB — ALLERGEN, CHICKEN F83: Chicken IgE: <0.1 kU/L

## 2018-01-12 LAB — ALLERGEN BANANA: Allergen Banana IgE: <0.1 kU/L

## 2018-01-12 LAB — ALLERGEN, CINNAMON, RF220: Allergen Cinnamon IgE: <0.1 kU/L

## 2018-01-12 LAB — ALLERGEN SESAME F10: Sesame Seed IgE: <0.1 kU/L

## 2018-01-12 LAB — ALLERGEN, GARLIC, F47: Allergen Garlic IgE: <0.1 kU/L

## 2018-01-12 LAB — ALLERGEN, BLACK PEPPER,F280: Allergen Black Pepper IgE: <0.1 kU/L

## 2018-01-12 LAB — ALLERGEN, TOMATO F25: Allergen Tomato, IgE: <0.1 kU/L

## 2018-01-12 SURGERY — MANOMETRY, ESOPHAGUS
Anesthesia: Topical

## 2018-01-12 MED ORDER — LIDOCAINE VISCOUS HCL 2 % MT SOLN
OROMUCOSAL | Status: AC
  Filled 2018-01-12: qty 15

## 2018-01-12 SURGICAL SUPPLY — 2 items
FACESHIELD LNG OPTICON STERILE (SAFETY) IMPLANT
GLOVE BIO SURGEON STRL SZ8 (GLOVE) ×4 IMPLANT

## 2018-01-12 NOTE — Progress Notes (Signed)
Esophageal manometry performed per protocol.  Patient tolerated procedure without complications.  PH probe placed at 35cm per protocol without complications. Patient tolerated procedure.  Patient instructed on when to return and how to use monitor.  Patient also instructed on using journal and to come back tomorrow after 0930.

## 2018-01-13 ENCOUNTER — Encounter (HOSPITAL_COMMUNITY): Payer: Self-pay | Admitting: Gastroenterology

## 2018-01-14 ENCOUNTER — Ambulatory Visit: Payer: BLUE CROSS/BLUE SHIELD | Admitting: Family Medicine

## 2018-01-28 DIAGNOSIS — R0789 Other chest pain: Secondary | ICD-10-CM

## 2018-01-28 DIAGNOSIS — R1110 Vomiting, unspecified: Secondary | ICD-10-CM

## 2018-02-20 DIAGNOSIS — H02403 Unspecified ptosis of bilateral eyelids: Secondary | ICD-10-CM | POA: Diagnosis not present

## 2018-02-24 ENCOUNTER — Ambulatory Visit: Payer: BLUE CROSS/BLUE SHIELD | Admitting: Family Medicine

## 2018-03-03 ENCOUNTER — Telehealth: Payer: Self-pay

## 2018-03-03 NOTE — Telephone Encounter (Signed)
Called and spoke with patient, she will keep appointment on 03/04/2018. Thanks!

## 2018-03-04 ENCOUNTER — Encounter: Payer: Self-pay | Admitting: Family Medicine

## 2018-03-04 ENCOUNTER — Ambulatory Visit (INDEPENDENT_AMBULATORY_CARE_PROVIDER_SITE_OTHER): Payer: BLUE CROSS/BLUE SHIELD | Admitting: Family Medicine

## 2018-03-04 VITALS — BP 124/84 | HR 64 | Temp 98.0°F | Ht 64.0 in | Wt 243.0 lb

## 2018-03-04 DIAGNOSIS — R600 Localized edema: Secondary | ICD-10-CM

## 2018-03-04 DIAGNOSIS — F418 Other specified anxiety disorders: Secondary | ICD-10-CM

## 2018-03-04 DIAGNOSIS — Z09 Encounter for follow-up examination after completed treatment for conditions other than malignant neoplasm: Secondary | ICD-10-CM

## 2018-03-04 DIAGNOSIS — Z0000 Encounter for general adult medical examination without abnormal findings: Secondary | ICD-10-CM | POA: Diagnosis not present

## 2018-03-04 DIAGNOSIS — R609 Edema, unspecified: Secondary | ICD-10-CM | POA: Diagnosis not present

## 2018-03-04 DIAGNOSIS — R4589 Other symptoms and signs involving emotional state: Secondary | ICD-10-CM

## 2018-03-04 DIAGNOSIS — R5383 Other fatigue: Secondary | ICD-10-CM

## 2018-03-04 DIAGNOSIS — G4700 Insomnia, unspecified: Secondary | ICD-10-CM

## 2018-03-04 MED ORDER — ZOLPIDEM TARTRATE 5 MG PO TABS: 5.0000 mg | ORAL_TABLET | Freq: Every evening | ORAL | 0 refills | Status: DC | PRN

## 2018-03-04 MED ORDER — FUROSEMIDE 20 MG PO TABS: 20.0000 mg | ORAL_TABLET | Freq: Every day | ORAL | 3 refills | Status: DC

## 2018-03-04 NOTE — Patient Instructions (Signed)
Furosemide tablets What is this medicine? FUROSEMIDE (fyoor [place] se mide) is a diuretic. It helps you make more urine and to lose salt and excess water from your body. This medicine is used to treat high blood pressure, and edema or swelling from heart, kidney, or liver disease. This medicine may be used for other purposes; ask your health care provider or pharmacist if you have questions. COMMON BRAND NAME(S): Active-Medicated Specimen Kit, Delone, Diuscreen, Lasix, RX Specimen Collection Kit, Specimen Collection Kit, URINX Medicated Specimen Collection What should I tell my health care provider before I take this medicine? They need to know if you have any of these conditions: -abnormal blood electrolytes -diarrhea or vomiting -gout -heart disease -kidney disease, small amounts of urine, or difficulty passing urine -liver disease -thyroid disease -an unusual or allergic reaction to furosemide, sulfa drugs, other medicines, foods, dyes, or preservatives -pregnant or trying to get pregnant -breast-feeding How should I use this medicine? Take this medicine by mouth with a glass of water. Follow the directions on the prescription label. You may take this medicine with or without food. If it upsets your stomach, take it with food or milk. Do not take your medicine more often than directed. Remember that you will need to pass more urine after taking this medicine. Do not take your medicine at a time of day that will cause you problems. Do not take at bedtime. Talk to your pediatrician regarding the use of this medicine in children. While this drug may be prescribed for selected conditions, precautions do apply. Overdosage: If you think you have taken too much of this medicine contact a poison control center or emergency room at once. NOTE: This medicine is only for you. Do not share this medicine with others. What if I miss a dose? If you miss a dose, take it as soon as you can. If it is almost time  for your next dose, take only that dose. Do not take double or extra doses. What may interact with this medicine? -aspirin and aspirin-like medicines -certain antibiotics -chloral hydrate -cisplatin -cyclosporine -digoxin -diuretics -laxatives -lithium -medicines for blood pressure -medicines that relax muscles for surgery -methotrexate -NSAIDs, medicines for pain and inflammation like ibuprofen, naproxen, or indomethacin -phenytoin -steroid medicines like prednisone or cortisone -sucralfate -thyroid hormones This list may not describe all possible interactions. Give your health care provider a list of all the medicines, herbs, non-prescription drugs, or dietary supplements you use. Also tell them if you smoke, drink alcohol, or use illegal drugs. Some items may interact with your medicine. What should I watch for while using this medicine? Visit your doctor or health care professional for regular checks on your progress. Check your blood pressure regularly. Ask your doctor or health care professional what your blood pressure should be, and when you should contact him or her. If you are a diabetic, check your blood sugar as directed. You may need to be on a special diet while taking this medicine. Check with your doctor. Also, ask how many glasses of fluid you need to drink a day. You must not get dehydrated. You may get drowsy or dizzy. Do not drive, use machinery, or do anything that needs mental alertness until you know how this drug affects you. Do not stand or sit up quickly, especially if you are an older patient. This reduces the risk of dizzy or fainting spells. Alcohol can make you more drowsy and dizzy. Avoid alcoholic drinks. This medicine can make you more sensitive  to the sun. Keep out of the sun. If you cannot avoid being in the sun, wear protective clothing and use sunscreen. Do not use sun lamps or tanning beds/booths. What side effects may I notice from receiving this  medicine? Side effects that you should report to your doctor or health care professional as soon as possible: -blood in urine or stools -dry mouth -fever or chills -hearing loss or ringing in the ears -irregular heartbeat -muscle pain or weakness, cramps -skin rash -stomach upset, pain, or nausea -tingling or numbness in the hands or feet -unusually weak or tired -vomiting or diarrhea -yellowing of the eyes or skin Side effects that usually do not require medical attention (report to your doctor or health care professional if they continue or are bothersome): -headache -loss of appetite -unusual bleeding or bruising This list may not describe all possible side effects. Call your doctor for medical advice about side effects. You may report side effects to FDA at 1-800-FDA-1088. Where should I keep my medicine? Keep out of the reach of children. Store at room temperature between 15 and 30 degrees C (59 and 86 degrees F). Protect from light. Throw away any unused medicine after the expiration date. NOTE: This sheet is a summary. It may not cover all possible information. If you have questions about this medicine, talk to your doctor, pharmacist, or health care provider.  2018 Elsevier/Gold Standard (2014-07-13 13:49:50)  

## 2018-03-04 NOTE — Progress Notes (Signed)
Follow Up  Subjective:    Patient ID: [name], female    DOB: February 11, 1976, 42 y.o.   MRN: [id number]   Chief Complaint  Patient presents with  . Follow-up  . Insomnia    HPI  Ms. Constancio is a 42 year old female with a past medical history of Urticaria, Migraines, Insomnia, Macromastia, Endometriosis, GERD, Constipation, Arthritis, Anxiety, Angio-Edema, Anemia, and Allergy. She is here today for follow up assessment of chronic diseases.   Current Status: Since her last office visit, she is doing well with no complaints.   She has had a recent visit with Optometry because of recent visul problems, pain in her eyes, and dry eyes. She has follow up appointment on 03/23/2018. She state that she continues to have occasional shortness of breath, occasional increase fatigue, and occasional trouble swallowing for over a year now. She is unable to walk a short distance without being out of breath. She is unable to workout, and has had a few sudden and  unexpected falls. She has not had any headaches, and dizziness. She has mild situational anxiety r/t healthcare status.   She denies fevers, chills, fatigue, recent infections, weight loss, and night sweats.  No chest pain, heart palpitations, cough and shortness of breath reported. No reports of GI problems such as nausea, vomiting, diarrhea, and constipation. She has no reports of blood in stools, dysuria and hematuria. She denies pain today.   Past Medical History:  Diagnosis Date  . Allergy   . Anemia   . Angio-edema   . Anxiety   . Arthritis   . Constipation   . GERD (gastroesophageal reflux disease)   . Hx of endometriosis   . Insomnia   . Macromastia 07/2011  . Migraine   . Urticaria     Family History  Problem Relation Age of Onset  . Diabetes Mother   . Hypertension Mother   . Thyroid disease Sister   . Heart attack Maternal Grandmother   . Stroke Maternal Grandfather   . Colon cancer Neg Hx   . Colon polyps Neg Hx    . Esophageal cancer Neg Hx   . Rectal cancer Neg Hx   . Stomach cancer Neg Hx     Social History   Socioeconomic History  . Marital status: Divorced    Spouse name: Not on file  . Number of children: 2  . Years of education: 12th  . Highest education level: Not on file  Occupational History    Employer: OTHER    Comment: [employer]  Social Needs  . Financial resource strain: Not on file  . Food insecurity:    Worry: Not on file    Inability: Not on file  . Transportation needs:    Medical: Not on file    Non-medical: Not on file  Tobacco Use  . Smoking status: Never Smoker  . Smokeless tobacco: Never Used  Substance and Sexual Activity  . Alcohol use: Yes    Comment: occasional   . Drug use: No  . Sexual activity: Not on file  Lifestyle  . Physical activity:    Days per week: Not on file    Minutes per session: Not on file  . Stress: Not on file  Relationships  . Social connections:    Talks on phone: Not on file    Gets together: Not on file    Attends religious service: Not on file    Active member of club or organization:  Not on file    Attends meetings of clubs or organizations: Not on file    Relationship status: Not on file  . Intimate partner violence:    Fear of current or ex partner: Not on file    Emotionally abused: Not on file    Physically abused: Not on file    Forced sexual activity: Not on file  Other Topics Concern  . Not on file  Social History Narrative   Patient lives at home with family.   Caffeine Use: 1 cup daily    Past Surgical History:  Procedure Laterality Date  . 24 HOUR PH STUDY N/A 01/12/2018   Procedure: 24 HOUR PH STUDY;  Surgeon: Napoleon Form, MD;  Location: [facility];  Service: Endoscopy;  Laterality: N/A;  . ABDOMINAL HYSTERECTOMY  12/12/2003  . ADENOIDECTOMY  05/2011  . ANKLE ARTHROSCOPY  12/29/2007   right; with extensive debridement  . BLADDER NECK RECONSTRUCTION  01/14/2011   Procedure: BLADDER NECK REPAIR;   Surgeon: Reva Bores, MD;  Location: [facility];  Service: Gynecology;  Laterality: N/A;  Laparoscopic Repair of Incidental Cystotomy  . BREAST REDUCTION SURGERY  08/05/2011   Procedure: MAMMARY REDUCTION  (BREAST);  Surgeon: Louisa Second, MD;  Location: [facility];  Service: Plastics;  Laterality: Bilateral;  bilateral  . CESAREAN SECTION  12/29/2000; 1994  . DILATION AND CURETTAGE OF UTERUS  07/08/2003   open laparoscopy  . ESOPHAGEAL MANOMETRY N/A 01/12/2018   Procedure: ESOPHAGEAL MANOMETRY (EM);  Surgeon: Napoleon Form, MD;  Location: [facility];  Service: Endoscopy;  Laterality: N/A;  . GIVENS CAPSULE STUDY N/A 06/23/2017   Procedure: GIVENS CAPSULE STUDY;  Surgeon: Benancio Deeds, MD;  Location: [facility];  Service: Gastroenterology;  Laterality: N/A;  . LAPAROSCOPIC SALPINGOOPHERECTOMY  01/14/2011   left  . PH IMPEDANCE STUDY N/A 01/12/2018   Procedure: PH IMPEDANCE STUDY;  Surgeon: Napoleon Form, MD;  Location: [facility];  Service: Endoscopy;  Laterality: N/A;  . REPAIR PERONEAL TENDONS ANKLE  01/03/2010   repair right subluxing peroneal tendons  . RESECTION DISTAL CLAVICAL Right 09/25/2017   Procedure: RESECTION DISTAL CLAVICAL;  Surgeon: Bjorn Pippin, MD;  Location: [facility];  Service: Orthopedics;  Laterality: Right;  . RIGHT OOPHORECTOMY     with lysis of adhesions  . SHOULDER ACROMIOPLASTY Right 09/25/2017   Procedure: SHOULDER ACROMIOPLASTY;  Surgeon: Bjorn Pippin, MD;  Location: [facility];  Service: Orthopedics;  Laterality: Right;  . SHOULDER ARTHROSCOPY WITH DEBRIDEMENT AND BICEP TENDON REPAIR Right 09/25/2017   Procedure: SHOULDER ARTHROSCOPY WITH DEBRIDEMENT AND BICEP TENDON REPAIR;  Surgeon: Bjorn Pippin, MD;  Location: [facility];  Service: Orthopedics;  Laterality: Right;  . SHOULDER ARTHROSCOPY WITH ROTATOR CUFF REPAIR Right 09/25/2017   Procedure: SHOULDER ARTHROSCOPY WITH ROTATOR CUFF  REPAIR;  Surgeon: Bjorn Pippin, MD;  Location: [facility];  Service: Orthopedics;  Laterality: Right;  . TONSILLECTOMY  as a child  . TUBAL LIGATION  12/29/2000    Immunization History  Administered Date(s) Administered  . Influenza,inj,Quad PF,6+ Mos 02/15/2016, 12/24/2017  . Td 05/06/1994  . Tdap 12/06/2015    Current Meds  Medication Sig  . cetirizine (ZYRTEC) 10 MG tablet Take 1 tablet (10 mg total) by mouth daily.  . cholecalciferol (VITAMIN D) 1000 units tablet Take 1 tablet (1,000 Units total) by mouth daily.  [name] EPINEPHrine (AUVI-Q) 0.3 mg/0.3 mL IJ SOAJ injection Inject 0.3 mLs (0.3 mg  total) into the muscle once as needed.  [name] esomeprazole (NEXIUM) 40 MG capsule TAKE 1 CAPSULE BY MOUTH EVERY MORNING (Patient taking differently: Take 40 mg by mouth in the morning)  . ferrous sulfate 325 (65 FE) MG EC tablet Take 1 tablet (325 mg total) by mouth 2 (two) times daily with a meal. (Patient taking differently: Take 325 mg by mouth 2 (two) times daily as needed. )  . loratadine (CLARITIN) 10 MG tablet Take 1 tablet (10 mg total) by mouth daily.  . polyethylene glycol (MIRALAX / GLYCOLAX) packet Take 17 g by mouth daily as needed.  . zolpidem (AMBIEN) 5 MG tablet Take 1 tablet (5 mg total) by mouth at bedtime as needed. for sleep  . [DISCONTINUED] Olopatadine HCl (PATADAY) 0.2 % SOLN Place 1 drop into both eyes daily as needed.  . [DISCONTINUED] ranitidine (ZANTAC) 150 MG tablet Take 1 tab twice daily every morning.  . [DISCONTINUED] zolpidem (AMBIEN) 5 MG tablet Take 1 tablet (5 mg total) by mouth at bedtime as needed. for sleep    No Known Allergies  BP 124/84 (BP Location: Left Arm, Patient Position: Sitting, Cuff Size: Large)   Pulse 64   Temp 98 F (36.7 C) (Oral)   Ht 5\' 4"  (1.626 m)   Wt 243 lb (110.2 kg)   LMP 12/27/2003   SpO2 100%   BMI 41.71 kg/m    Review of Systems  Constitutional: Negative.   HENT: Negative.   Respiratory: Negative.    Cardiovascular: Negative.   Gastrointestinal: Negative.   Genitourinary: Negative.   Musculoskeletal: Negative.   Skin: Negative.   Neurological: Negative.   Psychiatric/Behavioral: Negative.    Objective:   Physical Exam  Constitutional: She is oriented to person, place, and time. She appears well-developed and well-nourished.  HENT:  Head: Normocephalic and atraumatic.  Eyes: Pupils are equal, round, and reactive to light. EOM are normal.  Neck: Normal range of motion. Neck supple.  Cardiovascular: Normal rate, regular rhythm, normal heart sounds and intact distal pulses.  Pulmonary/Chest: Effort normal and breath sounds normal.  Abdominal: Soft. Bowel sounds are normal.  Musculoskeletal: Normal range of motion.  Neurological: She is alert and oriented to person, place, and time.  Skin: Skin is warm and dry.  Psychiatric: Her behavior is normal. Judgment and thought content normal.  Mild anxiety  Nursing note and vitals reviewed.  Assessment & Plan:   1. Insomnia, unspecified type We will initiate a one time trial of Ambien for situational anxiety and lack of sleep.  - zolpidem (AMBIEN) 5 MG tablet; Take 1 tablet (5 mg total) by mouth at bedtime as needed. for sleep  Dispense: 30 tablet; Refill: 0  2. Anxiety about health - zolpidem (AMBIEN) 5 MG tablet; Take 1 tablet (5 mg total) by mouth at bedtime as needed. for sleep  Dispense: 30 tablet; Refill: 0  3. Fatigue, unspecified type - Lupus anticoagulant panel  4. Healthcare maintenance - Systemic lupus panel-comprehensive - LUPUS(12) PANEL - Myasthenia gravis panel 2 - Acetylcholine Receptor Ab, All  5. Peripheral edema We will initiate Lasix today.  - furosemide (LASIX) 20 MG tablet; Take 1 tablet (20 mg total) by mouth daily.  Dispense: 30 tablet; Refill: 3 - Lupus anticoagulant panel  6. Follow up She will follow up and lab assessment in 2 weeks.   Meds ordered this encounter  Medications  . zolpidem  (AMBIEN) 5 MG tablet    Sig: Take 1 tablet (5 mg total) by mouth at  bedtime as needed. for sleep    Dispense:  30 tablet    Refill:  0    Order Specific Question:   Supervising Provider    Answer:   Quentin Angst [id number]  . furosemide (LASIX) 20 MG tablet    Sig: Take 1 tablet (20 mg total) by mouth daily.    Dispense:  30 tablet    Refill:  3   Raliegh Ip,  MSN, FNP-C Patient [facility] [address] [place], [place] [address] [phone]

## 2018-03-05 LAB — LUPUS ANTICOAGULANT PANEL
Dilute Viper Venom Time: 37.3 s (ref 0.0–47.0)
PTT Lupus Anticoagulant: 32.1 s (ref 0.0–51.9)

## 2018-03-12 ENCOUNTER — Telehealth: Payer: Self-pay

## 2018-03-12 NOTE — Telephone Encounter (Signed)
Left a vm for patient to callback 

## 2018-03-12 NOTE — Telephone Encounter (Signed)
-----   Message from Natalie M Stroud, FNP sent at 03/12/2018  6:41 AM EST ----- Regarding: "Lab Results" Please inform patient that lab results are negative. Will discuss further with her at next office visit on 03/18/2018.   Thank you.  

## 2018-03-12 NOTE — Telephone Encounter (Signed)
Patient notified and will follow up at appointment on 11/13

## 2018-03-12 NOTE — Telephone Encounter (Signed)
-----   Message from Kallie Locks, FNP sent at 03/12/2018  6:41 AM EST ----- Regarding: "Lab Results" Please inform patient that lab results are negative. Will discuss further with her at next office visit on 03/18/2018.   Thank you.

## 2018-03-17 ENCOUNTER — Emergency Department (HOSPITAL_COMMUNITY)
Admission: EM | Admit: 2018-03-17 | Discharge: 2018-03-17 | Disposition: A | Discharge status: Home / Self Care | Payer: BLUE CROSS/BLUE SHIELD | Attending: Emergency Medicine | Admitting: Emergency Medicine

## 2018-03-17 ENCOUNTER — Other Ambulatory Visit: Payer: Self-pay

## 2018-03-17 ENCOUNTER — Emergency Department (HOSPITAL_COMMUNITY): Payer: BLUE CROSS/BLUE SHIELD

## 2018-03-17 ENCOUNTER — Encounter (HOSPITAL_COMMUNITY): Payer: Self-pay

## 2018-03-17 DIAGNOSIS — R0789 Other chest pain: Secondary | ICD-10-CM | POA: Diagnosis not present

## 2018-03-17 DIAGNOSIS — Z79899 Other long term (current) drug therapy: Secondary | ICD-10-CM | POA: Insufficient documentation

## 2018-03-17 DIAGNOSIS — R1013 Epigastric pain: Secondary | ICD-10-CM | POA: Diagnosis not present

## 2018-03-17 DIAGNOSIS — R0602 Shortness of breath: Secondary | ICD-10-CM | POA: Insufficient documentation

## 2018-03-17 DIAGNOSIS — M25511 Pain in right shoulder: Secondary | ICD-10-CM | POA: Diagnosis not present

## 2018-03-17 DIAGNOSIS — R079 Chest pain, unspecified: Secondary | ICD-10-CM | POA: Diagnosis not present

## 2018-03-17 LAB — BASIC METABOLIC PANEL
Anion gap: 6 (ref 5–15)
BUN: 9 mg/dL (ref 6–20)
CO2: 29 mmol/L (ref 22–32)
Calcium: 9.3 mg/dL (ref 8.9–10.3)
Chloride: 102 mmol/L (ref 98–111)
Creatinine, Ser: 0.81 mg/dL (ref 0.44–1.00)
GFR calc Af Amer: >60 mL/min (ref >60)
GFR calc non Af Amer: >60 mL/min (ref >60)
Glucose, Bld: 95 mg/dL (ref 70–99)
Potassium: 4.3 mmol/L (ref 3.5–5.1)
Sodium: 137 mmol/L (ref 135–145)

## 2018-03-17 LAB — CBC WITH DIFFERENTIAL/PLATELET
Abs Immature Granulocytes: 0.01 10*3/uL (ref 0.00–0.07)
Basophils Absolute: 0 10*3/uL (ref 0.0–0.1)
Basophils Relative: 0 %
Eosinophils Absolute: 0 10*3/uL (ref 0.0–0.5)
Eosinophils Relative: 1 %
HCT: 38.2 % (ref 36.0–46.0)
Hemoglobin: 11.9 g/dL — ABNORMAL LOW (ref 12.0–15.0)
Immature Granulocytes: 0 %
Lymphocytes Relative: 44 %
Lymphs Abs: 2.1 10*3/uL (ref 0.7–4.0)
MCH: 24.8 pg — ABNORMAL LOW (ref 26.0–34.0)
MCHC: 31.2 g/dL (ref 30.0–36.0)
MCV: 79.6 fL — ABNORMAL LOW (ref 80.0–100.0)
Monocytes Absolute: 0.3 10*3/uL (ref 0.1–1.0)
Monocytes Relative: 7 %
Neutro Abs: 2.3 10*3/uL (ref 1.7–7.7)
Neutrophils Relative %: 48 %
Platelets: 327 10*3/uL (ref 150–400)
RBC: 4.8 MIL/uL (ref 3.87–5.11)
RDW: 13.1 % (ref 11.5–15.5)
WBC: 4.7 10*3/uL (ref 4.0–10.5)
nRBC: 0 % (ref 0.0–0.2)

## 2018-03-17 LAB — I-STAT TROPONIN, ED
Troponin i, poc: 0 ng/mL (ref 0.00–0.08)
Troponin i, poc: 0 ng/mL (ref 0.00–0.08)

## 2018-03-17 LAB — BRAIN NATRIURETIC PEPTIDE: B Natriuretic Peptide: 9.9 pg/mL (ref 0.0–100.0)

## 2018-03-17 MED ORDER — PREDNISONE 10 MG (21) PO TBPK: ORAL_TABLET | Freq: Every day | ORAL | 0 refills | Status: DC

## 2018-03-17 MED ORDER — KETOROLAC TROMETHAMINE 30 MG/ML IJ SOLN
30.0000 mg | Freq: Once | INTRAMUSCULAR | Status: AC
  Administered 2018-03-17: 30 mg via INTRAVENOUS
  Filled 2018-03-17: qty 1

## 2018-03-17 MED ORDER — FAMOTIDINE IN NACL 20-0.9 MG/50ML-% IV SOLN
20.0000 mg | Freq: Once | INTRAVENOUS | Status: AC
  Administered 2018-03-17: 20 mg via INTRAVENOUS
  Filled 2018-03-17: qty 50

## 2018-03-17 NOTE — ED Triage Notes (Signed)
Pt states CP midsternal and SOB x days. States 8/10 pain that is constand but gets worse with laying down at night, and also to push on the sternum. States she is having to stack up pillows to lessen the feeling of pressure on her chest. Nothing makes it better. Radiates to right scapula. Denies fever, chills, sweats, or cough, has not been around sick that she is aware of, but does drive a bus daily for occupation.

## 2018-03-17 NOTE — ED Notes (Signed)
Pt ambulated down hallway. NAD noted, steady gait, no assistance needed. Spo2 lowest reading 94% on room air. No complaints from pt while ambulating.

## 2018-03-17 NOTE — Discharge Instructions (Signed)
1. Medications: Take steroid taper as prescribed with food to avoid upset stomach issues.  Do not take ibuprofen, Advil, Aleve, or Motrin while taking this medicine.  You may take [id number] mg of Tylenol every 6 hours as needed for pain. Do not exceed 4000 mg of Tylenol daily.   2. Treatment: rest, apply ice or heat to areas of soreness, whichever feels best. Drink plenty of fluids 3. Follow Up: Please followup with your primary care physician or cardiologist within 1 week for discussion of your diagnoses and further evaluation after today's visit; Please return to the ER for worsening symptoms or other concerns such as fevers, worsening shortness of breath or chest pain, loss of consciousness, or coughing up blood

## 2018-03-17 NOTE — ED Notes (Signed)
Patient transported to X-ray 

## 2018-03-17 NOTE — ED Provider Notes (Signed)
[facility] EMERGENCY DEPARTMENT Provider Note   CSN: [id number] Arrival date & time: 03/17/18  1219     History   Chief Complaint Chief Complaint  Patient presents with  . Chest Pain    Midsternal with SOB x days    HPI [name] is a 42 y.o. female with history of allergies, anemia, angioedema, anxiety, GERD, endometriosis, and migraines presents for evaluation of acute onset, progressively worsening shortness of breath and chest pain for 1 week.  She states that symptoms were initially intermittent but have become more constant in the past 2 to 3 days.  She notes substernal pressure which radiates to the right shoulder.  She states the pain is at times sharp and stabbing.  Symptoms worsen laying flat and with exertion.  She endorses some epigastric abdominal pain as well which radiates up to the chest, at times is burning.  Endorses nausea but no vomiting.  Denies diaphoresis, lightheadedness, or leg swelling.  No recent travel or surgeries, no hemoptysis, no prior history of DVT or PE, not on hormonal placement therapy.  She is a non-smoker.  Denies recreational drug use or excessive alcohol intake.  She has tried Excedrin, Tylenol, ibuprofen with mild temporary improvement in her symptoms.  The history is provided by the patient.    Past Medical History:  Diagnosis Date  . Allergy   . Anemia   . Angio-edema   . Anxiety   . Arthritis   . Constipation   . GERD (gastroesophageal reflux disease)   . Hx of endometriosis   . Insomnia   . Macromastia 07/2011  . Migraine   . Urticaria     Patient Active Problem List   Diagnosis Date Noted  . Atypical chest pain   . Regurgitation of food   . ANA positive 12/30/2016  . Gastroesophageal reflux disease 06/27/2016  . Disturbance of skin sensation 04/27/2014  . Dizziness and giddiness 03/14/2014  . Headache, migraine 03/14/2014  . Myalgia and myositis, unspecified 06/07/2013  . Nausea and vomiting in  adult 08/22/2012  . Viral syndrome 08/22/2012  . Pelvic pain in female 12/18/2010  . PID (acute pelvic inflammatory disease) 12/18/2010  . ANEMIA-IRON DEFICIENCY 10/15/2006  . HERPES, GENITAL NOS 09/30/2006  . OBESITY NOS 09/30/2006  . DISORDER, NONORGANIC SLEEP NOS 09/30/2006  . CONSTIPATION NOS 09/30/2006  . NEURITIS, LUMBOSACRAL NOS 09/30/2006  . HX, PERSONAL, MENTAL DISORDER NOS 09/30/2006    Past Surgical History:  Procedure Laterality Date  . 24 HOUR PH STUDY N/A 01/12/2018   Procedure: 24 HOUR PH STUDY;  Surgeon: Napoleon Form, MD;  Location: [facility];  Service: Endoscopy;  Laterality: N/A;  . ABDOMINAL HYSTERECTOMY  12/12/2003  . ADENOIDECTOMY  05/2011  . ANKLE ARTHROSCOPY  12/29/2007   right; with extensive debridement  . BLADDER NECK RECONSTRUCTION  01/14/2011   Procedure: BLADDER NECK REPAIR;  Surgeon: Reva Bores, MD;  Location: [facility];  Service: Gynecology;  Laterality: N/A;  Laparoscopic Repair of Incidental Cystotomy  . BREAST REDUCTION SURGERY  08/05/2011   Procedure: MAMMARY REDUCTION  (BREAST);  Surgeon: Louisa Second, MD;  Location: [facility];  Service: Plastics;  Laterality: Bilateral;  bilateral  . CESAREAN SECTION  12/29/2000; 1994  . DILATION AND CURETTAGE OF UTERUS  07/08/2003   open laparoscopy  . ESOPHAGEAL MANOMETRY N/A 01/12/2018   Procedure: ESOPHAGEAL MANOMETRY (EM);  Surgeon: Napoleon Form, MD;  [facility];  Service: Endoscopy;  Laterality: N/A;  . GIVENS  CAPSULE STUDY N/A 06/23/2017   Procedure: GIVENS CAPSULE STUDY;  Surgeon: Benancio Deeds, MD;  Location: [facility]COPY;  Service: Gastroenterology;  Laterality: N/A;  . LAPAROSCOPIC SALPINGOOPHERECTOMY  01/14/2011   left  . PH IMPEDANCE STUDY N/A 01/12/2018   Procedure: PH IMPEDANCE STUDY;  Surgeon: Napoleon Form, MD;  [facility];  Service: Endoscopy;  Laterality: N/A;  . REPAIR PERONEAL TENDONS ANKLE  01/03/2010   repair right subluxing peroneal  tendons  . RESECTION DISTAL CLAVICAL Right 09/25/2017   Procedure: RESECTION DISTAL CLAVICAL;  Surgeon: Bjorn Pippin, MD;  Location: [facility];  Service: Orthopedics;  Laterality: Right;  . RIGHT OOPHORECTOMY     with lysis of adhesions  . SHOULDER ACROMIOPLASTY Right 09/25/2017   Procedure: SHOULDER ACROMIOPLASTY;  Surgeon: Bjorn Pippin, MD;  Location: [facility];  Service: Orthopedics;  Laterality: Right;  . SHOULDER ARTHROSCOPY WITH DEBRIDEMENT AND BICEP TENDON REPAIR Right 09/25/2017   Procedure: SHOULDER ARTHROSCOPY WITH DEBRIDEMENT AND BICEP TENDON REPAIR;  Surgeon: Bjorn Pippin, MD;  Location: [facility];  Service: Orthopedics;  Laterality: Right;  . SHOULDER ARTHROSCOPY WITH ROTATOR CUFF REPAIR Right 09/25/2017   Procedure: SHOULDER ARTHROSCOPY WITH ROTATOR CUFF REPAIR;  Surgeon: Bjorn Pippin, MD;  Location: [facility];  Service: Orthopedics;  Laterality: Right;  . TONSILLECTOMY  as a child  . TUBAL LIGATION  12/29/2000     OB History    Gravida  2   Para  2   Term  2   Preterm      AB      Living  2     SAB      TAB      Ectopic      Multiple      Live Births               Home Medications    Prior to Admission medications   Medication Sig Start Date End Date Taking? Authorizing Provider  acetaminophen (TYLENOL) 325 MG tablet Take 650 mg by mouth every 6 (six) hours as needed for mild pain or headache.   Yes [provider]  cetirizine (ZYRTEC) 10 MG tablet Take 1 tablet (10 mg total) by mouth daily. Patient taking differently: Take 10 mg by mouth daily as needed for allergies or rhinitis.  01/09/18  Yes Padgett, Pilar Grammes, MD  EPINEPHrine (AUVI-Q) 0.3 mg/0.3 mL IJ SOAJ injection Inject 0.3 mLs (0.3 mg total) into the muscle once as needed. Patient taking differently: Inject 0.3 mg into the muscle once as needed (for severe allergic reaction or otherwise instructed).  01/09/18  Yes  Padgett, Pilar Grammes, MD  esomeprazole (NEXIUM) 40 MG capsule TAKE 1 CAPSULE BY MOUTH EVERY MORNING Patient taking differently: Take 40 mg by mouth in the morning 02/05/17  Yes Bing Neighbors, FNP  furosemide (LASIX) 20 MG tablet Take 1 tablet (20 mg total) by mouth daily. Patient taking differently: Take 20 mg by mouth daily as needed for edema.  03/04/18  Yes Kallie Locks, FNP  ibuprofen (ADVIL,MOTRIN) 200 MG tablet Take 200-400 mg by mouth every 6 (six) hours as needed for headache or mild pain.   Yes [provider]  polyethylene glycol (MIRALAX / GLYCOLAX) packet Take 17 g by mouth daily as needed for mild constipation.    Yes [provider]  zolpidem (AMBIEN) 5 MG tablet Take 1 tablet (5 mg total) by mouth at bedtime as needed. for  sleep Patient taking differently: Take 5 mg by mouth at bedtime as needed for sleep.  03/04/18  Yes Kallie Locks, FNP  cholecalciferol (VITAMIN D) 1000 units tablet Take 1 tablet (1,000 Units total) by mouth daily. Patient not taking: Reported on 03/17/2018 12/25/17 03/27/18  Kallie Locks, FNP  ferrous sulfate 325 (65 FE) MG EC tablet Take 1 tablet (325 mg total) by mouth 2 (two) times daily with a meal. Patient not taking: Reported on 03/17/2018 02/05/17   Benancio Deeds, MD  loratadine (CLARITIN) 10 MG tablet Take 1 tablet (10 mg total) by mouth daily. Patient not taking: Reported on 03/17/2018 12/24/17   Kallie Locks, FNP  predniSONE (STERAPRED UNI-PAK 21 TAB) 10 MG (21) TBPK tablet Take by mouth daily. Take 6 tabs by mouth daily  for 1 days, then 5 tabs for 1 days, then 4 tabs for 1 days, then 3 tabs for 1 days, 2 tabs for 1 days, then 1 tab by mouth daily for 1 days 03/17/18   Jeanie Sewer, PA-C    Family History Family History  Problem Relation Age of Onset  . Diabetes Mother   . Hypertension Mother   . Thyroid disease Sister   . Heart attack Maternal Grandmother   . Stroke Maternal Grandfather   .  Colon cancer Neg Hx   . Colon polyps Neg Hx   . Esophageal cancer Neg Hx   . Rectal cancer Neg Hx   . Stomach cancer Neg Hx     Social History Social History   Tobacco Use  . Smoking status: Never Smoker  . Smokeless tobacco: Never Used  Substance Use Topics  . Alcohol use: Yes    Comment: occasional   . Drug use: No     Allergies   Patient has no known allergies.   Review of Systems Review of Systems  Constitutional: Negative for chills and fever.  Respiratory: Positive for shortness of breath. Negative for cough.   Cardiovascular: Positive for chest pain.  Gastrointestinal: Positive for abdominal pain and nausea. Negative for constipation, diarrhea and vomiting.  Genitourinary: Negative for dysuria, hematuria and urgency.  All other systems reviewed and are negative.    Physical Exam Updated Vital Signs BP 126/84   Pulse 64   Temp 98.2 F (36.8 C) (Oral)   Resp 13   Ht 5\' 4"  (1.626 m)   Wt 110 kg   LMP 12/27/2003   SpO2 100%   BMI 41.63 kg/m   Physical Exam  Constitutional: She appears well-developed and well-nourished. No distress.  HENT:  Head: Normocephalic and atraumatic.  Eyes: Conjunctivae are normal. Right eye exhibits no discharge. Left eye exhibits no discharge.  Neck: No JVD present. No tracheal deviation present.  Cardiovascular: Normal rate and regular rhythm.  Pulses:      Carotid pulses are 2+ on the right side, and 2+ on the left side.      Radial pulses are 2+ on the right side, and 2+ on the left side.       Dorsalis pedis pulses are 2+ on the right side, and 2+ on the left side.       Posterior tibial pulses are 2+ on the right side, and 2+ on the left side.  Homans sign absent bilaterally, no lower extremity edema, no palpable cords, compartments are soft   Pulmonary/Chest: Effort normal and breath sounds normal.  Speaking in full sentences without difficulty.  Tenderness to palpation of the chest wall in  the parasternal left  anterior regions.  No warmth, crepitus, ecchymosis, or flail segment.    Abdominal: Soft. Bowel sounds are normal. She exhibits no distension. There is no tenderness.  Musculoskeletal: She exhibits no edema.       Right lower leg: Normal. She exhibits no tenderness and no edema.       Left lower leg: Normal. She exhibits no tenderness and no edema.  Neurological: She is alert.  Skin: Skin is warm and dry. No erythema.  Psychiatric: She has a normal mood and affect. Her behavior is normal.  Nursing note and vitals reviewed.    ED Treatments / Results  Labs (all labs ordered are listed, but only abnormal results are displayed) Labs Reviewed  CBC WITH DIFFERENTIAL/PLATELET - Abnormal; Notable for the following components:      Result Value   Hemoglobin 11.9 (*)    MCV 79.6 (*)    MCH 24.8 (*)    All other components within normal limits  BASIC METABOLIC PANEL  BRAIN NATRIURETIC PEPTIDE  I-STAT TROPONIN, ED  I-STAT TROPONIN, ED    EKG EKG Interpretation  Date/Time:  Tuesday March 17 2018 12:29:05 EST Ventricular Rate:  63 PR Interval:    QRS Duration: 102 QT Interval:  417 QTC Calculation: 427 R Axis:   68 Text Interpretation:  Sinus rhythm `no acute change from ecg 02/19/17 Confirmed by Cathren LaineSteinl, Kevin ([id number]) on 03/17/2018 12:31:46 PM   Radiology Dg Chest 2 View  Result Date: 03/17/2018 CLINICAL DATA:  Chest pain and short of breath for 3 days EXAM: CHEST - 2 VIEW COMPARISON:  03/14/2017 FINDINGS: Normal heart size. Lungs clear. No pneumothorax. No pleural effusion. IMPRESSION: No active cardiopulmonary disease. Electronically Signed   By: Jolaine ClickArthur  Hoss M.D.   On: 03/17/2018 13:46    Procedures Procedures (including critical care time)  Medications Ordered in ED Medications  ketorolac (TORADOL) 30 MG/ML injection 30 mg (30 mg Intravenous Given 03/17/18 1410)  famotidine (PEPCID) IVPB 20 mg premix (0 mg Intravenous Stopped 03/17/18 1445)     Initial Impression  / Assessment and Plan / ED Course  I have reviewed the triage vital signs and the nursing notes.  Pertinent labs & imaging results that were available during my care of the patient were reviewed by me and considered in my medical decision making (see chart for details).     Patient presented for evaluation of substernal and right-sided chest pains intermittently for 1 week.  She is afebrile, vital signs are stable.  She is nontoxic in appearance.  Pain is reproducible on palpation.  EKG shows normal sinus rhythm, no ischemic changes.  Serial troponins are negative and she has a HEART score of 2, low risk for ACS/MI. She is PERC negative and I doubt PE.  Chest x-ray shows no cardiomegaly, no acute cardiopulmonary abnormalities.  No abdominal tenderness on examination to suggest intra-abdominal pathology such as pancreatitis, cholecystitis, or gastritis.  Remainder of lab work reviewed by me shows mild anemia, otherwise unremarkable.  No metabolic derangements, BNP within normal limits.  She was ambulated with stable SPO2 saturations.  Does not appear to be in new onset heart failure.  No evidence of cardiac tamponade, esophageal rupture, pneumothorax, or other acute life-threatening cardiopulmonary abnormality.  Favor musculoskeletal etiology as pain is reproducible on palpation.  Reevaluation, patient is resting comfortably no apparent distress.  She had mild improvement with administration of Toradol and Pepcid.  Will discharge with steroid taper, recommend Tylenol, and follow-up with PCP and  cardiology for reevaluation of symptoms.  Discussed strict ED return precautions. Pt verbalized understanding of and agreement with plan and is safe for discharge home at this time.   Final Clinical Impressions(s) / ED Diagnoses   Final diagnoses:  Atypical chest pain    ED Discharge Orders         Ordered    predniSONE (STERAPRED UNI-PAK 21 TAB) 10 MG (21) TBPK tablet  Daily     03/17/18 1659             Jeanie Sewer, PA-C 03/17/18 1707    Cathren Laine, MD 03/20/18 2013

## 2018-03-18 ENCOUNTER — Ambulatory Visit (INDEPENDENT_AMBULATORY_CARE_PROVIDER_SITE_OTHER): Payer: BLUE CROSS/BLUE SHIELD | Admitting: Family Medicine

## 2018-03-18 ENCOUNTER — Encounter: Payer: Self-pay | Admitting: Family Medicine

## 2018-03-18 VITALS — BP 124/68 | HR 68 | Temp 97.8°F | Ht 64.0 in | Wt 237.0 lb

## 2018-03-18 DIAGNOSIS — R1319 Other dysphagia: Secondary | ICD-10-CM | POA: Diagnosis not present

## 2018-03-18 DIAGNOSIS — F418 Other specified anxiety disorders: Secondary | ICD-10-CM

## 2018-03-18 DIAGNOSIS — D509 Iron deficiency anemia, unspecified: Secondary | ICD-10-CM

## 2018-03-18 DIAGNOSIS — K5900 Constipation, unspecified: Secondary | ICD-10-CM

## 2018-03-18 DIAGNOSIS — F419 Anxiety disorder, unspecified: Secondary | ICD-10-CM

## 2018-03-18 DIAGNOSIS — Z09 Encounter for follow-up examination after completed treatment for conditions other than malignant neoplasm: Secondary | ICD-10-CM

## 2018-03-18 DIAGNOSIS — R4589 Other symptoms and signs involving emotional state: Secondary | ICD-10-CM

## 2018-03-18 NOTE — Progress Notes (Signed)
Follow Up  Subjective:    Patient ID: [name], female    DOB: 07-01-1975, 42 y.o.   MRN: [id number]  Chief Complaint  Patient presents with  . Follow-up    Insomnia/labs   HPI  [name] is a 42 year old female with a past medical history of Urticaria, Migraine, Macromastia, Insomnia, Endometriosis, GERD, Constipation, Arthritis, Anxiety, Anemia, and Allergy. She is here today for follow up assessment.    Current Status: Since her last office visit, he was recently in ED on 03/17/2018 for chest pain. She was assessed and cardiac and pulmonary abnormalities were r/o. She state that she continues to have difficulty swallowing, intermittent falling, occasional drooping of right eye, occasional posterior neck pain. She reports these symptoms intermittent for the past year. She admits to anxiety today.   She denies fevers, chills, fatigue, recent infections, weight loss, and night sweats. No chest pain, heart palpitations, cough and shortness of breath reported. No reports of GI problems such as nausea, vomiting, diarrhea, and constipation. She has no reports of blood in stools, dysuria and hematuria. She denies pain today.   Past Medical History:  Diagnosis Date  . Allergy   . Anemia   . Angio-edema   . Anxiety   . Arthritis   . Constipation   . GERD (gastroesophageal reflux disease)   . Hx of endometriosis   . Insomnia   . Macromastia 07/2011  . Migraine   . Urticaria     Family History  Problem Relation Age of Onset  . Diabetes Mother   . Hypertension Mother   . Thyroid disease Sister   . Heart attack Maternal Grandmother   . Stroke Maternal Grandfather   . Colon cancer Neg Hx   . Colon polyps Neg Hx   . Esophageal cancer Neg Hx   . Rectal cancer Neg Hx   . Stomach cancer Neg Hx      Social History   Socioeconomic History  . Marital status: Divorced    Spouse name: Not on file  . Number of children: 2  . Years of education: 12th  . Highest education  level: Not on file  Occupational History    Employer: OTHER    Comment: [employer]  Social Needs  . Financial resource strain: Not on file  . Food insecurity:    Worry: Not on file    Inability: Not on file  . Transportation needs:    Medical: Not on file    Non-medical: Not on file  Tobacco Use  . Smoking status: Never Smoker  . Smokeless tobacco: Never Used  Substance and Sexual Activity  . Alcohol use: Yes    Comment: occasional   . Drug use: No  . Sexual activity: Not on file  Lifestyle  . Physical activity:    Days per week: Not on file    Minutes per session: Not on file  . Stress: Not on file  Relationships  . Social connections:    Talks on phone: Not on file    Gets together: Not on file    Attends religious service: Not on file    Active member of club or organization: Not on file    Attends meetings of clubs or organizations: Not on file    Relationship status: Not on file  . Intimate partner violence:    Fear of current or ex partner: Not on file    Emotionally abused: Not on file    Physically abused:  Not on file    Forced sexual activity: Not on file  Other Topics Concern  . Not on file  Social History Narrative   Patient lives at home with family.   Caffeine Use: 1 cup daily    Past Surgical History:  Procedure Laterality Date  . 24 HOUR PH STUDY N/A 01/12/2018   Procedure: 24 HOUR PH STUDY;  Surgeon: Napoleon Form, MD;  [facility];  Service: Endoscopy;  Laterality: N/A;  . ABDOMINAL HYSTERECTOMY  12/12/2003  . ADENOIDECTOMY  05/2011  . ANKLE ARTHROSCOPY  12/29/2007   right; with extensive debridement  . BLADDER NECK RECONSTRUCTION  01/14/2011   Procedure: BLADDER NECK REPAIR;  Surgeon: Reva Bores, MD;  [facility];  Service: Gynecology;  Laterality: N/A;  Laparoscopic Repair of Incidental Cystotomy  . BREAST REDUCTION SURGERY  08/05/2011   Procedure: MAMMARY REDUCTION  (BREAST);  Surgeon: Louisa Second, MD;  Location: [facility]  [place];  Service: Plastics;  Laterality: Bilateral;  bilateral  . CESAREAN SECTION  12/29/2000; 1994  . DILATION AND CURETTAGE OF UTERUS  07/08/2003   open laparoscopy  . ESOPHAGEAL MANOMETRY N/A 01/12/2018   Procedure: ESOPHAGEAL MANOMETRY (EM);  Surgeon: Napoleon Form, MD;  [facility];  Service: Endoscopy;  Laterality: N/A;  . GIVENS CAPSULE STUDY N/A 06/23/2017   Procedure: GIVENS CAPSULE STUDY;  Surgeon: Benancio Deeds, MD;  [facility];  Service: Gastroenterology;  Laterality: N/A;  . LAPAROSCOPIC SALPINGOOPHERECTOMY  01/14/2011   left  . PH IMPEDANCE STUDY N/A 01/12/2018   Procedure: PH IMPEDANCE STUDY;  Surgeon: Napoleon Form, MD;  [facility];  Service: Endoscopy;  Laterality: N/A;  . REPAIR PERONEAL TENDONS ANKLE  01/03/2010   repair right subluxing peroneal tendons  . RESECTION DISTAL CLAVICAL Right 09/25/2017   Procedure: RESECTION DISTAL CLAVICAL;  Surgeon: Bjorn Pippin, MD;  Location: [facility];  Service: Orthopedics;  Laterality: Right;  . RIGHT OOPHORECTOMY     with lysis of adhesions  . SHOULDER ACROMIOPLASTY Right 09/25/2017   Procedure: SHOULDER ACROMIOPLASTY;  Surgeon: Bjorn Pippin, MD;  Location: [facility];  Service: Orthopedics;  Laterality: Right;  . SHOULDER ARTHROSCOPY WITH DEBRIDEMENT AND BICEP TENDON REPAIR Right 09/25/2017   Procedure: SHOULDER ARTHROSCOPY WITH DEBRIDEMENT AND BICEP TENDON REPAIR;  Surgeon: Bjorn Pippin, MD;  Location: [facility];  Service: Orthopedics;  Laterality: Right;  . SHOULDER ARTHROSCOPY WITH ROTATOR CUFF REPAIR Right 09/25/2017   Procedure: SHOULDER ARTHROSCOPY WITH ROTATOR CUFF REPAIR;  Surgeon: Bjorn Pippin, MD;  [facility];  Service: Orthopedics;  Laterality: Right;  . TONSILLECTOMY  as a child  . TUBAL LIGATION  12/29/2000    Immunization History  Administered Date(s) Administered  . Influenza,inj,Quad  PF,6+ Mos 02/15/2016, 12/24/2017  . Td 05/06/1994  . Tdap 12/06/2015    Current Meds  Medication Sig  . acetaminophen (TYLENOL) 325 MG tablet Take 650 mg by mouth every 6 (six) hours as needed for mild pain or headache.  . cetirizine (ZYRTEC) 10 MG tablet Take 1 tablet (10 mg total) by mouth daily. (Patient taking differently: Take 10 mg by mouth daily as needed for allergies or rhinitis. )  . cholecalciferol (VITAMIN D) 1000 units tablet Take 1 tablet (1,000 Units total) by mouth daily.  [name] EPINEPHrine (AUVI-Q) 0.3 mg/0.3 mL IJ SOAJ injection Inject 0.3 mLs (0.3 mg total) into the muscle once as needed. (Patient taking differently: Inject 0.3 mg into  the muscle once as needed (for severe allergic reaction or otherwise instructed). )  . esomeprazole (NEXIUM) 40 MG capsule TAKE 1 CAPSULE BY MOUTH EVERY MORNING (Patient taking differently: Take 40 mg by mouth in the morning)  . furosemide (LASIX) 20 MG tablet Take 1 tablet (20 mg total) by mouth daily. (Patient taking differently: Take 20 mg by mouth daily as needed for edema. )  . ibuprofen (ADVIL,MOTRIN) 200 MG tablet Take 200-400 mg by mouth every 6 (six) hours as needed for headache or mild pain.  [name]. loratadine (CLARITIN) 10 MG tablet Take 1 tablet (10 mg total) by mouth daily.  . polyethylene glycol (MIRALAX / GLYCOLAX) packet Take 17 g by mouth daily as needed for mild constipation.   . predniSONE (STERAPRED UNI-PAK 21 TAB) 10 MG (21) TBPK tablet Take by mouth daily. Take 6 tabs by mouth daily  for 1 days, then 5 tabs for 1 days, then 4 tabs for 1 days, then 3 tabs for 1 days, 2 tabs for 1 days, then 1 tab by mouth daily for 1 days  . zolpidem (AMBIEN) 5 MG tablet Take 1 tablet (5 mg total) by mouth at bedtime as needed. for sleep (Patient taking differently: Take 5 mg by mouth at bedtime as needed for sleep. )    No Known Allergies  BP 124/68 (BP Location: Right Arm, Patient Position: Sitting, Cuff Size: Large)   Pulse 68   Temp 97.8 F  (36.6 C) (Oral)   Ht 5\' 4"  (1.626 m)   Wt 237 lb (107.5 kg)   LMP 12/27/2003   SpO2 100%   BMI 40.68 kg/m    Review of Systems  Constitutional: Negative.   HENT: Negative.   Eyes: Negative.   Respiratory: Negative.   Cardiovascular: Negative.   Gastrointestinal: Negative.   Endocrine: Negative.   Genitourinary: Negative.   Musculoskeletal: Negative.   Skin: Negative.   Allergic/Immunologic: Negative.   Neurological: Positive for dizziness and headaches.  Hematological: Negative.   Psychiatric/Behavioral: Negative.    Objective:   Physical Exam  Constitutional: She is oriented to person, place, and time. She appears well-developed and well-nourished.  HENT:  Head: Normocephalic and atraumatic.  Eyes: Pupils are equal, round, and reactive to light. Conjunctivae and EOM are normal.  Neck: Normal range of motion. Neck supple.  Cardiovascular: Normal rate, regular rhythm, normal heart sounds and intact distal pulses.  Pulmonary/Chest: Effort normal and breath sounds normal.  Abdominal: Soft. Bowel sounds are normal.  Musculoskeletal: Normal range of motion.  Neurological: She is alert and oriented to person, place, and time.  Skin: Skin is warm and dry.  Psychiatric: She has a normal mood and affect. Her behavior is normal. Judgment and thought content normal.  Nursing note and vitals reviewed.  Assessment & Plan:   1. Anxiety Anxiety about physical symptoms over the past year. We will refer her to Neurology for further evaluation, and to Psychiatry for counseling.  - Ambulatory referral to Psychiatry  2. Anxiety about health Offered to prescribe medication to aide with anxiety today. Patient prefers Benzodiazepines. We will refer her to Psychiatry today for patient counseling, possible prescribing, and closer monitoring of this class of medication.  - Ambulatory referral to Psychiatry  3. Other dysphagia Reports occasional difficulty swallowing. Basic neurologic  screening is negative for abnormalities. We will refer her to Neurology for further evaluation.  - Ambulatory referral to Neurology  4. Constipation, unspecified constipation type She will continue Miralax as needed.   5. Iron  deficiency anemia, unspecified iron deficiency anemia type Stable. Hgb at 11.9 on 03/17/2018. Monitor.   6. Follow up She will follow up in 3 months.   No orders of the defined types were placed in this encounter.   Referral Orders     Ambulatory referral to Neurology     Ambulatory referral to Psychiatry   Raliegh Ip,  MSN, FNP-C [facility] [address] [place], [place] [address] [phone]

## 2018-03-19 LAB — ACETYLCHOLINE RECEPTOR AB, ALL
AChR Binding Ab, Serum: 0.07 nmol/L (ref 0.00–0.24)
Acetylchol Block Ab: 16 % (ref 0–25)
Acetylcholine Modulat Ab: <12 % (ref 0–20)

## 2018-03-19 LAB — MYASTHENIA GRAVIS PANEL 2: Anti-striation Abs: NEGATIVE

## 2018-03-23 DIAGNOSIS — R531 Weakness: Secondary | ICD-10-CM | POA: Diagnosis not present

## 2018-03-23 DIAGNOSIS — H04123 Dry eye syndrome of bilateral lacrimal glands: Secondary | ICD-10-CM | POA: Diagnosis not present

## 2018-03-23 DIAGNOSIS — G514 Facial myokymia: Secondary | ICD-10-CM | POA: Diagnosis not present

## 2018-03-23 DIAGNOSIS — M25611 Stiffness of right shoulder, not elsewhere classified: Secondary | ICD-10-CM | POA: Diagnosis not present

## 2018-03-23 DIAGNOSIS — H02889 Meibomian gland dysfunction of unspecified eye, unspecified eyelid: Secondary | ICD-10-CM | POA: Diagnosis not present

## 2018-03-24 ENCOUNTER — Encounter: Payer: Self-pay | Admitting: Neurology

## 2018-03-31 ENCOUNTER — Other Ambulatory Visit: Payer: Self-pay

## 2018-03-31 DIAGNOSIS — G4700 Insomnia, unspecified: Secondary | ICD-10-CM

## 2018-04-01 ENCOUNTER — Telehealth: Payer: Self-pay

## 2018-04-01 NOTE — Telephone Encounter (Signed)
Patient needs a refill on Ambien.

## 2018-04-08 ENCOUNTER — Ambulatory Visit (INDEPENDENT_AMBULATORY_CARE_PROVIDER_SITE_OTHER): Payer: BLUE CROSS/BLUE SHIELD | Admitting: Licensed Clinical Social Worker

## 2018-04-08 ENCOUNTER — Encounter (HOSPITAL_COMMUNITY): Payer: Self-pay | Admitting: Licensed Clinical Social Worker

## 2018-04-08 ENCOUNTER — Other Ambulatory Visit: Payer: Self-pay | Admitting: Family Medicine

## 2018-04-08 DIAGNOSIS — F411 Generalized anxiety disorder: Secondary | ICD-10-CM | POA: Diagnosis not present

## 2018-04-08 DIAGNOSIS — G4700 Insomnia, unspecified: Secondary | ICD-10-CM

## 2018-04-08 DIAGNOSIS — M25611 Stiffness of right shoulder, not elsewhere classified: Secondary | ICD-10-CM | POA: Diagnosis not present

## 2018-04-08 MED ORDER — ZOLPIDEM TARTRATE 5 MG PO TABS: 5.0000 mg | ORAL_TABLET | Freq: Every evening | ORAL | 0 refills | Status: DC | PRN

## 2018-04-08 NOTE — Progress Notes (Unsigned)
Rx for Ambien sent to pharmacy today.
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
## 2018-04-09 ENCOUNTER — Telehealth: Payer: Self-pay

## 2018-04-09 NOTE — Telephone Encounter (Signed)
-----   Message from Kallie LocksNatalie M Stroud, FNP sent at 04/08/2018  8:39 AM EST ----- Regarding: "Refill" Rx for Ambien sent to pharmacy today. Please inform patient. Thank you.

## 2018-04-09 NOTE — Progress Notes (Signed)
NEUROLOGY CONSULTATION NOTE  [name] MRN: [id number] DOB: 1975/10/29  Referring provider: Raliegh IpNatalie Stroud, FNP Primary care provider: Raliegh IpNatalie Stroud, FNP  Reason for consult:  dysphagia  HISTORY OF PRESENT ILLNESS: [name] is a 42 year old female who presents for dysphagia.  Last year, she started having trouble swallowing.  At first, she had trouble only with solid food but has since included liquids.  She denies food getting stuck but it seems to take a while for it to go down.  She also reports shortness of breath both exertional or at rest.  She also notes associated wheezing and chest discomfort.  She was recently evaluated in the ED last month for chest pain.  Cardiac and pulmonary workup was negative.  She reports that she has been experiencing anxiety.  She has been evaluated by gastroenterology for regurgitation.  Gastric emptying study from July was normal.  At that time, she reported to GI that she did not have dysphagia.  Esophogeal manometry from September was normal. Myasthenia panel, including Anti-striation antibody, AChR binding antibodies, ACh Blocking antibodies, ACh modulating antibodies.  Lupus anticoagulant panel was negative.  B12 was normal.  She also reports intermittent horizontal double vision while driving or looking at something for prolonged period.  She saw her ophthalmologist, Dr. Dione BoozeGroat, who noted possible right ptosis.  She reports neck pain as well.  She also reports weakness and heaviness in the legs and reports 2 falls over the past year.  She has also reported having food allergies.  She underwent food allergy panel which was normal.  She has history of multiple neurologic and somatic symptoms.  She has previously endorsed diffuse pain and allodynia with sensations of electric shocks throughout her body.  She has history of migraines.  Of note, she had an MRI of the head without contrast on 01/16/17 to evaluate right arm numbness, which was  personally reviewed and was unremarkable.  She was subsequently diagnosed with a complicated migraine.  She has history of fatigue.  Past sleep study was negative.  She has previously been evaluated by rheumatology who has diagnosed her with fibromyalgia.  She has history of reflux and regurgitation.  She reportedly had a gastric emptying study in 2011.  Subsequent gastric imaging study from 2015 was normal. She has been evaluated by multiple ENTs.  She has anxiety.  She denies family history of autoimmune disease on her mother's side (she is not familiar with her father's side of the family).  PAST MEDICAL HISTORY: Past Medical History:  Diagnosis Date  . Allergy   . Anemia   . Angio-edema   . Anxiety   . Arthritis   . Constipation   . GERD (gastroesophageal reflux disease)   . Hx of endometriosis   . Insomnia   . Macromastia 07/2011  . Migraine   . Urticaria     PAST SURGICAL HISTORY: Past Surgical History:  Procedure Laterality Date  . 24 HOUR PH STUDY N/A 01/12/2018   Procedure: 24 HOUR PH STUDY;  Surgeon: Napoleon FormNandigam, Kavitha V, MD;  Location: [facility];  Service: Endoscopy;  Laterality: N/A;  . ABDOMINAL HYSTERECTOMY  12/12/2003  . ADENOIDECTOMY  05/2011  . ANKLE ARTHROSCOPY  12/29/2007   right; with extensive debridement  . BLADDER NECK RECONSTRUCTION  01/14/2011   Procedure: BLADDER NECK REPAIR;  Surgeon: Reva Boresanya S Pratt, MD;  Location: [facility];  Service: Gynecology;  Laterality: N/A;  Laparoscopic Repair of Incidental Cystotomy  . BREAST REDUCTION SURGERY  08/05/2011  Procedure: MAMMARY REDUCTION  (BREAST);  Surgeon: Louisa Second, MD;  Location: [facility];  Service: Plastics;  Laterality: Bilateral;  bilateral  . CESAREAN SECTION  12/29/2000; 1994  . DILATION AND CURETTAGE OF UTERUS  07/08/2003   open laparoscopy  . ESOPHAGEAL MANOMETRY N/A 01/12/2018   Procedure: ESOPHAGEAL MANOMETRY (EM);  Surgeon: Napoleon Form, MD;  Location: [facility];  Service:  Endoscopy;  Laterality: N/A;  . GIVENS CAPSULE STUDY N/A 06/23/2017   Procedure: GIVENS CAPSULE STUDY;  Surgeon: Benancio Deeds, MD;  Location: [facility];  Service: Gastroenterology;  Laterality: N/A;  . LAPAROSCOPIC SALPINGOOPHERECTOMY  01/14/2011   left  . PH IMPEDANCE STUDY N/A 01/12/2018   Procedure: PH IMPEDANCE STUDY;  Surgeon: Napoleon Form, MD;  Location: [facility];  Service: Endoscopy;  Laterality: N/A;  . REPAIR PERONEAL TENDONS ANKLE  01/03/2010   repair right subluxing peroneal tendons  . RESECTION DISTAL CLAVICAL Right 09/25/2017   Procedure: RESECTION DISTAL CLAVICAL;  Surgeon: Bjorn Pippin, MD;  Location: [facility];  Service: Orthopedics;  Laterality: Right;  . RIGHT OOPHORECTOMY     with lysis of adhesions  . SHOULDER ACROMIOPLASTY Right 09/25/2017   Procedure: SHOULDER ACROMIOPLASTY;  Surgeon: Bjorn Pippin, MD;  Location: [facility];  Service: Orthopedics;  Laterality: Right;  . SHOULDER ARTHROSCOPY WITH DEBRIDEMENT AND BICEP TENDON REPAIR Right 09/25/2017   Procedure: SHOULDER ARTHROSCOPY WITH DEBRIDEMENT AND BICEP TENDON REPAIR;  Surgeon: Bjorn Pippin, MD;  Location: [facility];  Service: Orthopedics;  Laterality: Right;  . SHOULDER ARTHROSCOPY WITH ROTATOR CUFF REPAIR Right 09/25/2017   Procedure: SHOULDER ARTHROSCOPY WITH ROTATOR CUFF REPAIR;  Surgeon: Bjorn Pippin, MD;  Location:  SURGERY CENTER;  Service: Orthopedics;  Laterality: Right;  . TONSILLECTOMY  as a child  . TUBAL LIGATION  12/29/2000    MEDICATIONS: Current Outpatient Medications on File Prior to Visit  Medication Sig Dispense Refill  . acetaminophen (TYLENOL) 325 MG tablet Take 650 mg by mouth every 6 (six) hours as needed for mild pain or headache.    . cetirizine (ZYRTEC) 10 MG tablet Take 1 tablet (10 mg total) by mouth daily. (Patient taking differently: Take 10 mg by mouth daily as needed for allergies or rhinitis. ) 30 tablet 5  .  EPINEPHrine (AUVI-Q) 0.3 mg/0.3 mL IJ SOAJ injection Inject 0.3 mLs (0.3 mg total) into the muscle once as needed. (Patient taking differently: Inject 0.3 mg into the muscle once as needed (for severe allergic reaction or otherwise instructed). ) 2 Device 1  . esomeprazole (NEXIUM) 40 MG capsule TAKE 1 CAPSULE BY MOUTH EVERY MORNING (Patient taking differently: Take 40 mg by mouth in the morning) 90 capsule 1  . ferrous sulfate 325 (65 FE) MG EC tablet Take 1 tablet (325 mg total) by mouth 2 (two) times daily with a meal. (Patient not taking: Reported on 03/18/2018) 60 tablet 3  . furosemide (LASIX) 20 MG tablet Take 1 tablet (20 mg total) by mouth daily. (Patient taking differently: Take 20 mg by mouth daily as needed for edema. ) 30 tablet 3  . ibuprofen (ADVIL,MOTRIN) 200 MG tablet Take 200-400 mg by mouth every 6 (six) hours as needed for headache or mild pain.    [name] loratadine (CLARITIN) 10 MG tablet Take 1 tablet (10 mg total) by mouth daily. 30 tablet 11  . polyethylene glycol (MIRALAX / GLYCOLAX) packet Take 17 g by mouth daily as needed for mild constipation.     [name]  predniSONE (STERAPRED UNI-PAK 21 TAB) 10 MG (21) TBPK tablet Take by mouth daily. Take 6 tabs by mouth daily  for 1 days, then 5 tabs for 1 days, then 4 tabs for 1 days, then 3 tabs for 1 days, 2 tabs for 1 days, then 1 tab by mouth daily for 1 days 21 tablet 0  . zolpidem (AMBIEN) 5 MG tablet Take 1 tablet (5 mg total) by mouth at bedtime as needed. for sleep 30 tablet 0   No current facility-administered medications on file prior to visit.     ALLERGIES: No Known Allergies  FAMILY HISTORY: Family History  Problem Relation Age of Onset  . Diabetes Mother   . Hypertension Mother   . Thyroid disease Sister   . Heart attack Maternal Grandmother   . Stroke Maternal Grandfather   . Colon cancer Neg Hx   . Colon polyps Neg Hx   . Esophageal cancer Neg Hx   . Rectal cancer Neg Hx   . Stomach cancer Neg Hx     SOCIAL  HISTORY: Social History   Socioeconomic History  . Marital status: Divorced    Spouse name: Not on file  . Number of children: 2  . Years of education: 12th  . Highest education level: Not on file  Occupational History    Employer: OTHER    Comment: [employer]  Social Needs  . Financial resource strain: Not on file  . Food insecurity:    Worry: Not on file    Inability: Not on file  . Transportation needs:    Medical: Not on file    Non-medical: Not on file  Tobacco Use  . Smoking status: Never Smoker  . Smokeless tobacco: Never Used  Substance and Sexual Activity  . Alcohol use: Yes    Comment: occasional   . Drug use: No  . Sexual activity: Not on file  Lifestyle  . Physical activity:    Days per week: Not on file    Minutes per session: Not on file  . Stress: Not on file  Relationships  . Social connections:    Talks on phone: Not on file    Gets together: Not on file    Attends religious service: Not on file    Active member of club or organization: Not on file    Attends meetings of clubs or organizations: Not on file    Relationship status: Not on file  . Intimate partner violence:    Fear of current or ex partner: Not on file    Emotionally abused: Not on file    Physically abused: Not on file    Forced sexual activity: Not on file  Other Topics Concern  . Not on file  Social History Narrative   Patient lives at home with family.   Caffeine Use: 1 cup daily    REVIEW OF SYSTEMS: Constitutional: No fevers, chills, or sweats, no generalized fatigue, change in appetite Eyes: No visual changes, double vision, eye pain Ear, nose and throat: No hearing loss, ear pain, nasal congestion, sore throat Cardiovascular: No chest pain, palpitations Respiratory:  No shortness of breath at rest or with exertion, wheezes GastrointestinaI: No nausea, vomiting, diarrhea, abdominal pain, fecal incontinence Genitourinary:  No dysuria, urinary retention or  frequency Musculoskeletal:  No neck pain, back pain Integumentary: No rash, pruritus, skin lesions Neurological: as above Psychiatric: No depression, insomnia, anxiety Endocrine: No palpitations, fatigue, diaphoresis, mood swings, change in appetite, change in weight, increased thirst  Hematologic/Lymphatic:  No purpura, petechiae. Allergic/Immunologic: no itchy/runny eyes, nasal congestion, recent allergic reactions, rashes  PHYSICAL EXAM: Blood pressure 122/76, pulse 73, height 5\' 4"  (1.626 m), weight 239 lb (108.4 kg), last menstrual period 12/27/2003, SpO2 100 %. General: No acute distress.  Patient appears well-groomed.  Head:  Normocephalic/atraumatic Eyes:  fundi examined but not visualized Neck: supple, no paraspinal tenderness, full range of motion Back: No paraspinal tenderness Heart: regular rate and rhythm Lungs: Clear to auscultation bilaterally. Vascular: No carotid bruits. Neurological Exam: Mental status: alert and oriented to person, place, and time, recent and remote memory intact, fund of knowledge intact, attention and concentration intact, speech fluent and not dysarthric, language intact. Cranial nerves: CN I: not tested CN II: pupils equal, round and reactive to light, visual fields intact CN III, IV, VI:  full range of motion, no nystagmus, no fatigable ptosis noted CN V: facial sensation intact CN VII: upper and lower face symmetric CN VIII: hearing intact CN IX, X: gag intact, uvula midline CN XI: sternocleidomastoid and trapezius muscles intact CN XII: tongue midline Bulk & Tone: normal, no fasciculations. Motor:  Some mild giveaway weakness of neck flexion but limited due to neck pain.  Otherwise, 5/5 throughout  Sensation:  temperature and vibration sensation intact. Deep Tendon Reflexes:  2+ throughout, toes downgoing.   Finger to nose testing:  Without dysmetria.   Heel to shin:  Without dysmetria.   Gait:  Normal station and stride.  Able to turn  and tandem walk. Romberg negative.  IMPRESSION: 1.  Subjective dysphagia.  She describes esophageal dysmotility rather than oropharyngeal dysfunction.  However, esophogeal manometry was normal.  2.  Intermittent diplopia 3.  Dyspnea 4.  Generalized weakness   There may be false negative antibodies to myasthenia gravis.  Therefeor  PLAN: 1.  As there may be false negative antibody testing to myasthenia gravis, we will order NCV-EMG with myasthenia protocol 2.  We will also check a thyroid panel 3.  Further recommendations pending results.  Thank you for allowing me to take part in the care of this patient.  Shon Millet, DO  CC:  Raliegh Ip, FNP

## 2018-04-09 NOTE — Telephone Encounter (Signed)
Patient notified

## 2018-04-10 ENCOUNTER — Encounter: Payer: Self-pay | Admitting: Neurology

## 2018-04-10 ENCOUNTER — Ambulatory Visit (INDEPENDENT_AMBULATORY_CARE_PROVIDER_SITE_OTHER): Payer: BLUE CROSS/BLUE SHIELD | Admitting: Neurology

## 2018-04-10 ENCOUNTER — Other Ambulatory Visit: Payer: Self-pay

## 2018-04-10 ENCOUNTER — Ambulatory Visit: Payer: BLUE CROSS/BLUE SHIELD | Admitting: Allergy

## 2018-04-10 ENCOUNTER — Other Ambulatory Visit (INDEPENDENT_AMBULATORY_CARE_PROVIDER_SITE_OTHER): Payer: BLUE CROSS/BLUE SHIELD

## 2018-04-10 VITALS — BP 122/76 | HR 73 | Ht 64.0 in | Wt 239.0 lb

## 2018-04-10 DIAGNOSIS — H532 Diplopia: Secondary | ICD-10-CM | POA: Diagnosis not present

## 2018-04-10 DIAGNOSIS — R1310 Dysphagia, unspecified: Secondary | ICD-10-CM | POA: Diagnosis not present

## 2018-04-10 DIAGNOSIS — R0602 Shortness of breath: Secondary | ICD-10-CM

## 2018-04-10 DIAGNOSIS — R5383 Other fatigue: Secondary | ICD-10-CM | POA: Diagnosis not present

## 2018-04-10 DIAGNOSIS — M25611 Stiffness of right shoulder, not elsewhere classified: Secondary | ICD-10-CM | POA: Diagnosis not present

## 2018-04-10 DIAGNOSIS — R1319 Other dysphagia: Secondary | ICD-10-CM

## 2018-04-10 NOTE — Patient Instructions (Addendum)
1.  We will order a test called an nerve-conduction test-EMG to evaluate for myasthenia gravis. Sometimes people may have it even if the blood work is normal. 2.  We will also check a thyroid panel.  Your provider requests that you have LABS drawn today.  We share a lab with [facility] - they are located in [address] (second floor) of this building.  Once you get there, please have a seat and the phlebotomist will call your name.  If you have waited more than 15 minutes, please advise the front desk  3.  Further recommendations pending results.

## 2018-04-10 NOTE — Addendum Note (Signed)
Addended by: Adline MangoSTONE-ELMORE, Nimesh Riolo I on: 04/10/2018 09:47 AM   Modules accepted: Orders

## 2018-04-11 LAB — THYROID PANEL WITH TSH
Free Thyroxine Index: 2.8 (ref 1.4–3.8)
T3 Uptake: 39 % — ABNORMAL HIGH (ref 22–35)
T4, Total: 7.2 ug/dL (ref 5.1–11.9)
TSH: 1.54 mIU/L

## 2018-04-17 ENCOUNTER — Encounter: Payer: Self-pay | Admitting: Podiatry

## 2018-04-17 ENCOUNTER — Ambulatory Visit (INDEPENDENT_AMBULATORY_CARE_PROVIDER_SITE_OTHER): Payer: BLUE CROSS/BLUE SHIELD | Admitting: Podiatry

## 2018-04-17 ENCOUNTER — Ambulatory Visit (INDEPENDENT_AMBULATORY_CARE_PROVIDER_SITE_OTHER): Payer: BLUE CROSS/BLUE SHIELD

## 2018-04-17 DIAGNOSIS — M779 Enthesopathy, unspecified: Principal | ICD-10-CM

## 2018-04-17 DIAGNOSIS — M7752 Other enthesopathy of left foot: Secondary | ICD-10-CM | POA: Diagnosis not present

## 2018-04-17 DIAGNOSIS — L853 Xerosis cutis: Secondary | ICD-10-CM | POA: Diagnosis not present

## 2018-04-17 DIAGNOSIS — M778 Other enthesopathies, not elsewhere classified: Secondary | ICD-10-CM

## 2018-04-17 MED ORDER — DICLOFENAC SODIUM 1 % TD GEL: 2.0000 g | Freq: Four times a day (QID) | TRANSDERMAL | 2 refills | Status: DC

## 2018-04-17 MED ORDER — AMMONIUM LACTATE 12 % EX CREA: TOPICAL_CREAM | CUTANEOUS | 0 refills | Status: DC | PRN

## 2018-04-20 DIAGNOSIS — M25611 Stiffness of right shoulder, not elsewhere classified: Secondary | ICD-10-CM | POA: Diagnosis not present

## 2018-04-20 DIAGNOSIS — R531 Weakness: Secondary | ICD-10-CM | POA: Diagnosis not present

## 2018-04-21 ENCOUNTER — Telehealth: Payer: Self-pay | Admitting: *Deleted

## 2018-04-21 ENCOUNTER — Encounter: Payer: Self-pay | Admitting: *Deleted

## 2018-04-21 NOTE — Telephone Encounter (Signed)
Results sent via My Chart.  

## 2018-04-21 NOTE — Telephone Encounter (Signed)
-----   Message from Vivien RotaAshley H Batsheva Stevick, LPN sent at [id number]/17/2019  2:42 PM EST -----  ----- Message ----- From: Drema DallasJaffe, Adam R, DO Sent: 04/20/2018   1:14 PM EST To: Dorthy CoolerSandra J Burns, CMA  Thyroid panel is unremarkable

## 2018-04-21 NOTE — Progress Notes (Signed)
Subjective: 42 year old female presents the office today for concerns of pain to the left foot which is been aching for the last 1 month.  She denies any recent injury.  She is been trying Advil without any significant relief.  She says it hurts worse after walking on hard surfaces.  She also has dry skin but she denies any open sores or any cracking or any drainage. Denies any systemic complaints such as fevers, chills, nausea, vomiting. No acute changes since last appointment, and no other complaints at this time.   Objective: AAO x3, NAD DP/PT pulses palpable bilaterally, CRT less than 3 seconds There is tenderness along submetatarsal 3 and mildly third interspace left foot today.  There is no area pinpoint any tenderness pain vibratory sensation.  Is no erythema increased warmth.  Minimal edema to the area as well.  There is no palpable neuroma identified. Dry skin is present with there is no evidence of any skin fissures or open sores or any drainage. No open lesions or pre-ulcerative lesions.  No pain with calf compression, swelling, warmth, erythema  Assessment: Capsulitis left foot with dry skin  Plan: -All treatment options discussed with the patient including all alternatives, risks, complications.  -X-rays were obtained and reviewed.  No evidence of acute fracture or stress fracture. -Steroid injection performed.  See procedure note below. -Prescribed Voltaren gel. -Metatarsal offloading pads.  Discussed shoe modifications and orthotics -Prescribed AmLactin. -Patient encouraged to call the office with any questions, concerns, change in symptoms.   Procedure: Injection small joint Discussed alternatives, risks, complications and verbal consent was obtained.  Location: Left third MPJ, interspace Skin Prep: Betadine. Injectate: 0.5cc 0.5% marcaine plain,1 cc kenalog 10. Disposition: Patient tolerated procedure well. Injection site dressed with a band-aid.  Post-injection care was  discussed and return precautions discussed.   Vivi BarrackMatthew R Jack Bolio DPM

## 2018-04-22 DIAGNOSIS — M25611 Stiffness of right shoulder, not elsewhere classified: Secondary | ICD-10-CM | POA: Diagnosis not present

## 2018-04-23 ENCOUNTER — Ambulatory Visit (HOSPITAL_COMMUNITY): Payer: Self-pay | Admitting: Licensed Clinical Social Worker

## 2018-05-07 ENCOUNTER — Encounter: Payer: Self-pay | Admitting: Neurology

## 2018-05-08 ENCOUNTER — Ambulatory Visit: Payer: BLUE CROSS/BLUE SHIELD | Admitting: Allergy

## 2018-05-14 ENCOUNTER — Ambulatory Visit: Payer: Self-pay

## 2018-05-29 ENCOUNTER — Ambulatory Visit (HOSPITAL_COMMUNITY): Payer: Self-pay | Admitting: Psychiatry

## 2018-06-02 ENCOUNTER — Ambulatory Visit: Payer: BLUE CROSS/BLUE SHIELD | Admitting: Neurology

## 2018-06-18 ENCOUNTER — Encounter: Payer: Self-pay | Admitting: Family Medicine

## 2018-06-19 ENCOUNTER — Ambulatory Visit: Payer: BLUE CROSS/BLUE SHIELD | Admitting: Family Medicine

## 2018-09-09 ENCOUNTER — Other Ambulatory Visit: Payer: Self-pay

## 2018-09-09 DIAGNOSIS — D509 Iron deficiency anemia, unspecified: Secondary | ICD-10-CM

## 2018-09-09 NOTE — Progress Notes (Signed)
Cbc entered.  Called and spoke to pt.  She will go to the lab this week or next for cbc.

## 2018-11-03 DIAGNOSIS — H04123 Dry eye syndrome of bilateral lacrimal glands: Secondary | ICD-10-CM | POA: Diagnosis not present

## 2018-11-03 DIAGNOSIS — H02886 Meibomian gland dysfunction of left eye, unspecified eyelid: Secondary | ICD-10-CM | POA: Diagnosis not present

## 2018-11-03 DIAGNOSIS — H02883 Meibomian gland dysfunction of right eye, unspecified eyelid: Secondary | ICD-10-CM | POA: Diagnosis not present

## 2018-11-03 DIAGNOSIS — H40013 Open angle with borderline findings, low risk, bilateral: Secondary | ICD-10-CM | POA: Diagnosis not present

## 2018-11-10 DIAGNOSIS — H40013 Open angle with borderline findings, low risk, bilateral: Secondary | ICD-10-CM | POA: Diagnosis not present

## 2018-11-10 DIAGNOSIS — H0288A Meibomian gland dysfunction right eye, upper and lower eyelids: Secondary | ICD-10-CM | POA: Diagnosis not present

## 2018-11-10 DIAGNOSIS — H0288B Meibomian gland dysfunction left eye, upper and lower eyelids: Secondary | ICD-10-CM | POA: Diagnosis not present

## 2018-11-10 DIAGNOSIS — H16223 Keratoconjunctivitis sicca, not specified as Sjogren's, bilateral: Secondary | ICD-10-CM | POA: Diagnosis not present

## 2019-02-09 DIAGNOSIS — F0630 Mood disorder due to known physiological condition, unspecified: Secondary | ICD-10-CM | POA: Diagnosis not present

## 2019-02-09 DIAGNOSIS — F4323 Adjustment disorder with mixed anxiety and depressed mood: Secondary | ICD-10-CM | POA: Diagnosis not present

## 2019-02-11 DIAGNOSIS — F4323 Adjustment disorder with mixed anxiety and depressed mood: Secondary | ICD-10-CM | POA: Diagnosis not present

## 2019-02-11 DIAGNOSIS — F0631 Mood disorder due to known physiological condition with depressive features: Secondary | ICD-10-CM | POA: Diagnosis not present

## 2019-02-16 DIAGNOSIS — F4323 Adjustment disorder with mixed anxiety and depressed mood: Secondary | ICD-10-CM | POA: Diagnosis not present

## 2019-02-16 DIAGNOSIS — F0630 Mood disorder due to known physiological condition, unspecified: Secondary | ICD-10-CM | POA: Diagnosis not present

## 2019-02-26 DIAGNOSIS — R61 Generalized hyperhidrosis: Secondary | ICD-10-CM | POA: Diagnosis not present

## 2019-02-26 DIAGNOSIS — R079 Chest pain, unspecified: Secondary | ICD-10-CM | POA: Diagnosis not present

## 2019-02-26 DIAGNOSIS — R531 Weakness: Secondary | ICD-10-CM | POA: Diagnosis not present

## 2019-02-26 DIAGNOSIS — R0602 Shortness of breath: Secondary | ICD-10-CM | POA: Diagnosis not present

## 2019-02-26 DIAGNOSIS — M549 Dorsalgia, unspecified: Secondary | ICD-10-CM | POA: Diagnosis not present

## 2019-02-26 DIAGNOSIS — R519 Headache, unspecified: Secondary | ICD-10-CM | POA: Diagnosis not present

## 2019-02-26 DIAGNOSIS — R072 Precordial pain: Secondary | ICD-10-CM | POA: Diagnosis not present

## 2019-02-28 DIAGNOSIS — R0600 Dyspnea, unspecified: Secondary | ICD-10-CM | POA: Diagnosis not present

## 2019-02-28 DIAGNOSIS — R002 Palpitations: Secondary | ICD-10-CM | POA: Diagnosis not present

## 2019-02-28 DIAGNOSIS — R1030 Lower abdominal pain, unspecified: Secondary | ICD-10-CM | POA: Diagnosis not present

## 2019-02-28 DIAGNOSIS — R519 Headache, unspecified: Secondary | ICD-10-CM | POA: Diagnosis not present

## 2019-02-28 DIAGNOSIS — G4700 Insomnia, unspecified: Secondary | ICD-10-CM | POA: Diagnosis not present

## 2019-02-28 DIAGNOSIS — R0789 Other chest pain: Secondary | ICD-10-CM | POA: Diagnosis not present

## 2019-02-28 DIAGNOSIS — R0602 Shortness of breath: Secondary | ICD-10-CM | POA: Diagnosis not present

## 2019-03-10 DIAGNOSIS — F0630 Mood disorder due to known physiological condition, unspecified: Secondary | ICD-10-CM | POA: Diagnosis not present

## 2019-03-10 DIAGNOSIS — F4323 Adjustment disorder with mixed anxiety and depressed mood: Secondary | ICD-10-CM | POA: Diagnosis not present

## 2019-03-18 DIAGNOSIS — F4323 Adjustment disorder with mixed anxiety and depressed mood: Secondary | ICD-10-CM | POA: Diagnosis not present

## 2019-03-18 DIAGNOSIS — F0630 Mood disorder due to known physiological condition, unspecified: Secondary | ICD-10-CM | POA: Diagnosis not present

## 2019-04-12 DIAGNOSIS — F4323 Adjustment disorder with mixed anxiety and depressed mood: Secondary | ICD-10-CM | POA: Diagnosis not present

## 2019-04-12 DIAGNOSIS — F0630 Mood disorder due to known physiological condition, unspecified: Secondary | ICD-10-CM | POA: Diagnosis not present

## 2019-04-20 DIAGNOSIS — F0630 Mood disorder due to known physiological condition, unspecified: Secondary | ICD-10-CM | POA: Diagnosis not present

## 2019-04-20 DIAGNOSIS — F4323 Adjustment disorder with mixed anxiety and depressed mood: Secondary | ICD-10-CM | POA: Diagnosis not present

## 2019-05-26 ENCOUNTER — Telehealth: Payer: Self-pay | Admitting: Gastroenterology

## 2019-05-27 ENCOUNTER — Telehealth: Payer: Self-pay

## 2019-05-27 NOTE — Telephone Encounter (Signed)
Left message to please call back. °

## 2019-06-05 ENCOUNTER — Other Ambulatory Visit: Payer: Self-pay

## 2019-06-05 ENCOUNTER — Ambulatory Visit (INDEPENDENT_AMBULATORY_CARE_PROVIDER_SITE_OTHER): Payer: 59

## 2019-06-05 ENCOUNTER — Ambulatory Visit (HOSPITAL_COMMUNITY)
Admission: EM | Admit: 2019-06-05 | Discharge: 2019-06-05 | Disposition: A | Discharge status: Home / Self Care | Payer: 59 | Attending: Family Medicine | Admitting: Family Medicine

## 2019-06-05 ENCOUNTER — Encounter (HOSPITAL_COMMUNITY): Payer: Self-pay

## 2019-06-05 DIAGNOSIS — R059 Cough, unspecified: Secondary | ICD-10-CM

## 2019-06-05 DIAGNOSIS — R0602 Shortness of breath: Secondary | ICD-10-CM | POA: Diagnosis not present

## 2019-06-05 DIAGNOSIS — Z20822 Contact with and (suspected) exposure to covid-19: Secondary | ICD-10-CM | POA: Diagnosis not present

## 2019-06-05 DIAGNOSIS — M1990 Unspecified osteoarthritis, unspecified site: Secondary | ICD-10-CM | POA: Insufficient documentation

## 2019-06-05 DIAGNOSIS — K219 Gastro-esophageal reflux disease without esophagitis: Secondary | ICD-10-CM | POA: Diagnosis not present

## 2019-06-05 DIAGNOSIS — R05 Cough: Secondary | ICD-10-CM | POA: Diagnosis not present

## 2019-06-05 DIAGNOSIS — R0981 Nasal congestion: Secondary | ICD-10-CM

## 2019-06-05 DIAGNOSIS — R5383 Other fatigue: Secondary | ICD-10-CM | POA: Diagnosis not present

## 2019-06-05 MED ORDER — HYDROCODONE-HOMATROPINE 5-1.5 MG/5ML PO SYRP: 5.0000 mL | ORAL_SOLUTION | Freq: Four times a day (QID) | ORAL | 0 refills | Status: DC | PRN

## 2019-06-05 NOTE — ED Triage Notes (Signed)
Patient presents to Urgent Care with complaints of nasal congestion and sob since a week ago, sob is worse when she lays down at night. Patient reports she also has fatigue, pt has been taking DayQuil and NyQuil Severe.

## 2019-06-05 NOTE — Discharge Instructions (Signed)
You have been tested for COVID-19 today. °If your test returns positive, you will receive a phone call from [facility] regarding your results. °Negative test results are not called. °Both positive and negative results area always visible on MyChart. °If you do not have a MyChart account, sign up instructions are provided in your discharge papers. °Please do not hesitate to contact us should you have questions or concerns. ° °

## 2019-06-06 LAB — NOVEL CORONAVIRUS, NAA (HOSP ORDER, SEND-OUT TO REF LAB; TAT 18-24 HRS): SARS-CoV-2, NAA: NOT DETECTED

## 2019-06-07 NOTE — ED Provider Notes (Signed)
[facility]   [id number] 06/05/19 Arrival Time: 1653  ASSESSMENT & PLAN:  1. Cough   2. Shortness of breath      COVID-19 testing sent. See letter/work note on file for self-isolation guidelines. I have personally viewed the imaging studies ordered this visit. No signs of PNA. Reassured.  Meds ordered this encounter  Medications   HYDROcodone-homatropine (HYCODAN) 5-1.5 MG/5ML syrup    Sig: Take 5 mLs by mouth every 6 (six) hours as needed for cough.    Dispense:  90 mL    Refill:  0     Follow-up Information    [facility] EMERGENCY DEPARTMENT.   Specialty: Emergency Medicine Why: If symptoms worsen in any way. Contact information: [address] [id number] mc [place] [address] [phone]          Reviewed expectations re: course of current medical issues. Questions answered. Outlined signs and symptoms indicating need for more acute intervention. Patient verbalized understanding. After Visit Summary given.  [place] Controlled Substances Registry consulted for this patient. I feel the risk/benefit ratio today is favorable for proceeding with this prescription for a controlled substance. Medication sedation precautions given.   SUBJECTIVE: History from: patient. [name] is a 44 y.o. female who reports nasal congestion and dry cough for the past weeks. Occasional feeling SOB; worse when supine. Overall fatigued. OTC NyQuil without much relief. Cough is affecting sleep. No associated CP. Known COVID-19 contact: none. Recent travel: none. Denies: fever, sore throat and headache. Normal PO intake without n/v/d.  ROS: As per HPI.   OBJECTIVE:  Vitals:   06/05/19 1729  BP: (!) 142/84  Pulse: 82  Resp: 17  Temp: 98.2 F (36.8 C)  TempSrc: Oral  SpO2: 99%    General appearance: alert; no distress Eyes: PERRLA; EOMI; conjunctiva normal HENT: [place]; AT; nasal mucosa normal; oral mucosa normal Neck:  supple  Lungs: speaks full sentences without difficulty; unlabored; no wheezing Extremities: no edema Skin: warm and dry Neurologic: normal gait Psychological: alert and cooperative; normal mood and affect  Labs:  Labs Reviewed  NOVEL CORONAVIRUS, NAA (HOSP ORDER, SEND-OUT TO REF LAB; TAT 18-24 HRS)    Imaging: DG Chest 2 View  Result Date: 06/05/2019 CLINICAL DATA:  Slight cough. EXAM: CHEST - 2 VIEW COMPARISON:  February 28, 2019 FINDINGS: The heart size and mediastinal contours are within normal limits. Both lungs are clear. The visualized skeletal structures are unremarkable. IMPRESSION: No active cardiopulmonary disease. Electronically Signed   By: Gerome Sam III M.D   On: 06/05/2019 18:16    No Known Allergies  Past Medical History:  Diagnosis Date   Allergy    Anemia    Angio-edema    Anxiety    Arthritis    Constipation    GERD (gastroesophageal reflux disease)    Hx of endometriosis    Insomnia    Macromastia 07/2011   Migraine    Urticaria    Social History   Socioeconomic History   Marital status: Divorced    Spouse name: Not on file   Number of children: 2   Years of education: 12th   Highest education level: Not on file  Occupational History    Employer: OTHER    Comment: [employer]  Tobacco Use   Smoking status: Never Smoker   Smokeless tobacco: Never Used  Substance and Sexual Activity   Alcohol use: Yes    Comment: occasional    Drug use: No  Sexual activity: Not on file  Other Topics Concern   Not on file  Social History Narrative   Pt lives in 2 story town home with her son   Has 2 children   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 [employer]   Right handed   Social Determinants of Health   Financial Resource Strain:    Difficulty of Paying Living Expenses: Not on file  Food Insecurity:    Worried About Charity fundraiser in the Last Year: Not on file   [employer] of Food in the Last Year: Not on  file  Transportation Needs:    Lack of Transportation (Medical): Not on file   Lack of Transportation (Non-Medical): Not on file  Physical Activity:    Days of Exercise per Week: Not on file   Minutes of Exercise per Session: Not on file  Stress:    Feeling of Stress : Not on file  Social Connections:    Frequency of Communication with Friends and Family: Not on file   Frequency of Social Gatherings with Friends and Family: Not on file   Attends Religious Services: Not on file   Active Member of Clubs or Organizations: Not on file   Attends Archivist Meetings: Not on file   Marital Status: Not on file  Intimate Partner Violence:    Fear of Current or Ex-Partner: Not on file   Emotionally Abused: Not on file   Physically Abused: Not on file   Sexually Abused: Not on file   Family History  Problem Relation Age of Onset   Diabetes Mother    Hypertension Mother    Thyroid disease Sister    Heart attack Maternal Grandmother    Stroke Maternal Grandfather    Colon cancer Neg Hx    Colon polyps Neg Hx    Esophageal cancer Neg Hx    Rectal cancer Neg Hx    Stomach cancer Neg Hx    Past Surgical History:  Procedure Laterality Date   10 HOUR [place] STUDY N/A 01/12/2018   Procedure: 24 HOUR [place];  Surgeon: Mauri Pole, MD;  Location: [facility];  Service: Endoscopy;  Laterality: N/A;   ABDOMINAL HYSTERECTOMY  12/12/2003   ADENOIDECTOMY  05/2011   ANKLE ARTHROSCOPY  12/29/2007   right; with extensive debridement   BLADDER NECK RECONSTRUCTION  01/14/2011   Procedure: BLADDER NECK REPAIR;  Surgeon: Donnamae Jude, MD;  Location: [facility];  Service: Gynecology;  Laterality: N/A;  Laparoscopic Repair of Incidental Cystotomy   BREAST REDUCTION SURGERY  08/05/2011   Procedure: MAMMARY REDUCTION  (BREAST);  Surgeon: Cristine Polio, MD;  Location: [place];  Service: Plastics;  Laterality: Bilateral;  bilateral   CESAREAN SECTION   12/29/2000; [place]  07/08/2003   open laparoscopy   ESOPHAGEAL MANOMETRY N/A 01/12/2018   Procedure: ESOPHAGEAL MANOMETRY (EM);  Surgeon: Mauri Pole, MD;  Location: [facility];  Service: Endoscopy;  Laterality: N/A;   GIVENS CAPSULE STUDY N/A 06/23/2017   Procedure: GIVENS CAPSULE STUDY;  Surgeon: Yetta Flock, MD;  Location: [place];  Service: Gastroenterology;  Laterality: N/A;   LAPAROSCOPIC SALPINGOOPHERECTOMY  01/14/2011   left   PH IMPEDANCE STUDY N/A 01/12/2018   Procedure: [place] IMPEDANCE STUDY;  Surgeon: Mauri Pole, MD;  Location: [facility];  Service: Endoscopy;  Laterality: N/A;   REPAIR PERONEAL TENDONS ANKLE  01/03/2010   repair right subluxing peroneal  tendons   RESECTION DISTAL CLAVICAL Right 09/25/2017   Procedure: RESECTION DISTAL CLAVICAL;  Surgeon: Bjorn Pippin, MD;  Location: [facility];  Service: Orthopedics;  Laterality: Right;   RIGHT OOPHORECTOMY     with lysis of adhesions   SHOULDER ACROMIOPLASTY Right 09/25/2017   Procedure: SHOULDER ACROMIOPLASTY;  Surgeon: Bjorn Pippin, MD;  Location: [facility];  Service: Orthopedics;  Laterality: Right;   SHOULDER ARTHROSCOPY WITH DEBRIDEMENT AND BICEP TENDON REPAIR Right 09/25/2017   Procedure: SHOULDER ARTHROSCOPY WITH DEBRIDEMENT AND BICEP TENDON REPAIR;  Surgeon: Bjorn Pippin, MD;  Location: [facility];  Service: Orthopedics;  Laterality: Right;   SHOULDER ARTHROSCOPY WITH ROTATOR CUFF REPAIR Right 09/25/2017   Procedure: SHOULDER ARTHROSCOPY WITH ROTATOR CUFF REPAIR;  Surgeon: Bjorn Pippin, MD;  Location: [facility];  Service: Orthopedics;  Laterality: Right;   TONSILLECTOMY  as a child   TUBAL LIGATION  12/29/2000     Mardella Layman, MD 06/07/19 [phone]

## 2019-06-17 ENCOUNTER — Ambulatory Visit (INDEPENDENT_AMBULATORY_CARE_PROVIDER_SITE_OTHER): Payer: 59 | Admitting: Nurse Practitioner

## 2019-06-17 ENCOUNTER — Other Ambulatory Visit: Payer: Self-pay

## 2019-06-17 ENCOUNTER — Encounter: Payer: Self-pay | Admitting: Nurse Practitioner

## 2019-06-17 VITALS — BP 130/84 | HR 56 | Temp 98.2°F | Ht 65.0 in | Wt 238.8 lb

## 2019-06-17 DIAGNOSIS — R5383 Other fatigue: Secondary | ICD-10-CM

## 2019-06-17 DIAGNOSIS — E669 Obesity, unspecified: Secondary | ICD-10-CM

## 2019-06-17 DIAGNOSIS — D509 Iron deficiency anemia, unspecified: Secondary | ICD-10-CM

## 2019-06-17 DIAGNOSIS — R1319 Other dysphagia: Secondary | ICD-10-CM

## 2019-06-17 DIAGNOSIS — R079 Chest pain, unspecified: Secondary | ICD-10-CM

## 2019-06-17 DIAGNOSIS — Z6839 Body mass index (BMI) 39.0-39.9, adult: Secondary | ICD-10-CM

## 2019-06-17 NOTE — Progress Notes (Signed)
This visit occurred during the SARS-CoV-2 public health emergency.  Safety protocols were in place, including screening questions prior to the visit, additional usage of staff PPE, and extensive cleaning of exam room while observing appropriate contact time as indicated for disinfecting solutions.  Subjective:     Patient ID: [name] , female    DOB: Nov 26, 1975 , 44 y.o.   MRN: [id number]   Chief Complaint  Patient presents with  . Establish Care  . Shortness of Breath    patient stated she has been having SOB from the last couple of years. occasional chest pains.    HPI  Here to re-establish care, she had been a patient previously with [facility].  She has been receiving her care at the [facility].  She has not been seen by a healthcare provider in at least one year.    PMH - anemia (iron supplement - when needed). She has had blood transfusions in the past.   She has a GI provider as well. She had been receiving iron transfusions every 3-6 months.  She had been seeing a neurologist - had a history of swallowing, dyspnea She has also seen Dr. Katy Fitch - she was set up to see a Neurologist - thyroid test and was unable to follow back up due to insurance issues.    She continues to have symptoms of swallowing issues, chest pain, and shortness of breath. If she drinks water it is difficult to get the food down.  She has not seen any health care providers since 2019.  She has not seen a pulmonologist  [facility] - thyroid issues  Shortness of Breath This is a chronic problem. The current episode started more than 1 year ago. The problem occurs constantly. The problem has been waxing and waning. Associated symptoms include chest pain. Pertinent negatives include no abdominal pain, fever, headaches, leg swelling, PND, rash, sore throat or vomiting. She has tried nothing for the symptoms.     Past Medical History:  Diagnosis Date  . Allergy   . Anemia   . Angio-edema   . Anxiety   .  Arthritis   . Constipation   . GERD (gastroesophageal reflux disease)   . Hx of endometriosis   . Insomnia   . Macromastia 07/2011  . Migraine   . Urticaria      Family History  Problem Relation Age of Onset  . Diabetes Mother   . Hypertension Mother   . Thyroid disease Sister   . Heart attack Maternal Grandmother   . Stroke Maternal Grandfather   . Colon cancer Neg Hx   . Colon polyps Neg Hx   . Esophageal cancer Neg Hx   . Rectal cancer Neg Hx   . Stomach cancer Neg Hx      Current Outpatient Medications:  .  acetaminophen (TYLENOL) 325 MG tablet, Take 650 mg by mouth every 6 (six) hours as needed for mild pain or headache., Disp: , Rfl:  .  cetirizine (ZYRTEC) 10 MG tablet, Take 1 tablet (10 mg total) by mouth daily. (Patient taking differently: Take 10 mg by mouth daily as needed for allergies or rhinitis. ), Disp: 30 tablet, Rfl: 5 .  esomeprazole (NEXIUM) 40 MG capsule, TAKE 1 CAPSULE BY MOUTH EVERY MORNING (Patient taking differently: Take 40 mg by mouth in the morning), Disp: 90 capsule, Rfl: 1 .  ferrous sulfate 325 (65 FE) MG EC tablet, Take 1 tablet (325 mg total) by mouth 2 (two) times  daily with a meal., Disp: 60 tablet, Rfl: 3 .  ibuprofen (ADVIL,MOTRIN) 200 MG tablet, Take 200-400 mg by mouth every 6 (six) hours as needed for headache or mild pain., Disp: , Rfl:  .  loratadine (CLARITIN) 10 MG tablet, Take 1 tablet (10 mg total) by mouth daily., Disp: 30 tablet, Rfl: 11 .  polyethylene glycol (MIRALAX / GLYCOLAX) packet, Take 17 g by mouth daily as needed for mild constipation. , Disp: , Rfl:    No Known Allergies   Review of Systems  Constitutional: Negative for fatigue and fever.  HENT: Negative for sore throat.   Respiratory: Positive for shortness of breath.   Cardiovascular: Positive for chest pain. Negative for palpitations, leg swelling and PND.  Gastrointestinal: Negative for abdominal pain and vomiting.  Skin: Negative for rash.  Neurological:  Negative for dizziness and headaches.  Psychiatric/Behavioral: Negative.      Today's Vitals   06/17/19 1148  BP: 130/84  Pulse: (!) 56  Temp: 98.2 F (36.8 C)  TempSrc: Oral  Weight: 238 lb 12.8 oz (108.3 kg)  Height: 5' 5" (1.651 m)  PainSc: 0-No pain   Body mass index is 39.74 kg/m.   Objective:  Physical Exam Constitutional:      Appearance: She is well-developed.  Pulmonary:     Breath sounds: No decreased breath sounds.  Skin:    General: Skin is warm and dry.     Capillary Refill: Capillary refill takes less than 2 seconds.  Neurological:     General: No focal deficit present.     Mental Status: She is alert.  Psychiatric:        Mood and Affect: Mood normal. Mood is not anxious.        Behavior: Behavior normal.         Assessment And Plan:    1. Iron deficiency anemia, unspecified iron deficiency anemia type  Will check iron studies due to previous history - CBC with Differential/Platelet - Iron, TIBC and Ferritin Panel  2. Fatigue, unspecified type  Will check metabolic cause - TSH - VITAMIN D 25 Hydroxy (Vit-D Deficiency, Fractures) - Hemoglobin A1c  3. Obesity (BMI 35.0-39.9 without comorbidity) Chronic Discussed healthy diet and regular exercise options  Encouraged to exercise at least 150 minutes per week with 2 days of strength training - CMP14+EGFR - Lipid panel  4. Chest pain, unspecified type  She has had a CXR which was normal.  EKG done with NSR HR 69 - EKG 12-Lead  5. Other dysphagia  Will have intermittent dysphagia  She may need a barium swallow or a referral to GI for further evaluation to check for structural damage.   Will check labs at this time  [name], FNP    THE PATIENT IS ENCOURAGED TO PRACTICE SOCIAL DISTANCING DUE TO THE COVID-19 PANDEMIC.

## 2019-06-18 LAB — CMP14+EGFR
ALT: 15 IU/L (ref 0–32)
AST: 20 IU/L (ref 0–40)
Albumin/Globulin Ratio: 1.4 (ref 1.2–2.2)
Albumin: 4.5 g/dL (ref 3.8–4.8)
Alkaline Phosphatase: 90 IU/L (ref 39–117)
BUN/Creatinine Ratio: 9 (ref 9–23)
BUN: 8 mg/dL (ref 6–24)
Bilirubin Total: 0.3 mg/dL (ref 0.0–1.2)
CO2: 25 mmol/L (ref 20–29)
Calcium: 9.4 mg/dL (ref 8.7–10.2)
Chloride: 101 mmol/L (ref 96–106)
Creatinine, Ser: 0.9 mg/dL (ref 0.57–1.00)
GFR calc Af Amer: 91 mL/min/{1.73_m2} (ref >59)
GFR calc non Af Amer: 79 mL/min/{1.73_m2} (ref >59)
Globulin, Total: 3.2 g/dL (ref 1.5–4.5)
Glucose: 72 mg/dL (ref 65–99)
Potassium: 4.3 mmol/L (ref 3.5–5.2)
Sodium: 140 mmol/L (ref 134–144)
Total Protein: 7.7 g/dL (ref 6.0–8.5)

## 2019-06-18 LAB — IRON,TIBC AND FERRITIN PANEL
Ferritin: 101 ng/mL (ref 15–150)
Iron Saturation: 19 % (ref 15–55)
Iron: 60 ug/dL (ref 27–159)
Total Iron Binding Capacity: 315 ug/dL (ref 250–450)
UIBC: 255 ug/dL (ref 131–425)

## 2019-06-18 LAB — CBC WITH DIFFERENTIAL/PLATELET
Basophils Absolute: 0 10*3/uL (ref 0.0–0.2)
Basos: 1 %
EOS (ABSOLUTE): 0.1 10*3/uL (ref 0.0–0.4)
Eos: 2 %
Hematocrit: 36.4 % (ref 34.0–46.6)
Hemoglobin: 11.3 g/dL (ref 11.1–15.9)
Immature Grans (Abs): 0 10*3/uL (ref 0.0–0.1)
Immature Granulocytes: 0 %
Lymphocytes Absolute: 1.9 10*3/uL (ref 0.7–3.1)
Lymphs: 46 %
MCH: 24.1 pg — ABNORMAL LOW (ref 26.6–33.0)
MCHC: 31 g/dL — ABNORMAL LOW (ref 31.5–35.7)
MCV: 78 fL — ABNORMAL LOW (ref 79–97)
Monocytes Absolute: 0.4 10*3/uL (ref 0.1–0.9)
Monocytes: 9 %
Neutrophils Absolute: 1.6 10*3/uL (ref 1.4–7.0)
Neutrophils: 42 %
Platelets: 339 10*3/uL (ref 150–450)
RBC: 4.68 x10E6/uL (ref 3.77–5.28)
RDW: 13 % (ref 11.7–15.4)
WBC: 3.9 10*3/uL (ref 3.4–10.8)

## 2019-06-18 LAB — VITAMIN D 25 HYDROXY (VIT D DEFICIENCY, FRACTURES): Vit D, 25-Hydroxy: 10.5 ng/mL — ABNORMAL LOW (ref 30.0–100.0)

## 2019-06-18 LAB — LIPID PANEL
Chol/HDL Ratio: 2.9 ratio (ref 0.0–4.4)
Cholesterol, Total: 185 mg/dL (ref 100–199)
HDL: 64 mg/dL (ref >39)
LDL Chol Calc (NIH): 108 mg/dL — ABNORMAL HIGH (ref 0–99)
Triglycerides: 70 mg/dL (ref 0–149)
VLDL Cholesterol Cal: 13 mg/dL (ref 5–40)

## 2019-06-18 LAB — HEMOGLOBIN A1C
Est. average glucose Bld gHb Est-mCnc: 117 mg/dL
Hgb A1c MFr Bld: 5.7 % — ABNORMAL HIGH (ref 4.8–5.6)

## 2019-06-18 LAB — TSH: TSH: 0.912 u[IU]/mL (ref 0.450–4.500)

## 2019-06-25 LAB — IRON AND TIBC
Iron Saturation: 17 % (ref 15–55)
Iron: 60 ug/dL (ref 27–159)
Total Iron Binding Capacity: 346 ug/dL (ref 250–450)
UIBC: 286 ug/dL (ref 131–425)

## 2019-06-25 LAB — FERRITIN: Ferritin: 102 ng/mL (ref 15–150)

## 2019-06-25 LAB — SPECIMEN STATUS REPORT

## 2019-06-28 ENCOUNTER — Encounter: Payer: Self-pay | Admitting: Nurse Practitioner

## 2019-07-08 ENCOUNTER — Ambulatory Visit: Payer: 59 | Admitting: Nurse Practitioner

## 2019-07-16 ENCOUNTER — Institutional Professional Consult (permissible substitution): Payer: 59 | Admitting: Pulmonary Disease

## 2019-07-21 ENCOUNTER — Ambulatory Visit: Payer: 59 | Admitting: Nurse Practitioner

## 2019-07-22 ENCOUNTER — Encounter: Payer: Self-pay | Admitting: Nurse Practitioner

## 2019-07-22 ENCOUNTER — Ambulatory Visit: Payer: 59 | Admitting: Nurse Practitioner

## 2019-07-22 ENCOUNTER — Other Ambulatory Visit: Payer: Self-pay

## 2019-07-22 ENCOUNTER — Ambulatory Visit (INDEPENDENT_AMBULATORY_CARE_PROVIDER_SITE_OTHER): Payer: 59 | Admitting: Nurse Practitioner

## 2019-07-22 VITALS — BP 126/80 | HR 90 | Temp 98.3°F | Ht 64.4 in | Wt 239.8 lb

## 2019-07-22 DIAGNOSIS — R0600 Dyspnea, unspecified: Secondary | ICD-10-CM

## 2019-07-22 DIAGNOSIS — R1319 Other dysphagia: Secondary | ICD-10-CM | POA: Diagnosis not present

## 2019-07-22 DIAGNOSIS — M25511 Pain in right shoulder: Secondary | ICD-10-CM | POA: Diagnosis not present

## 2019-07-22 DIAGNOSIS — R0609 Other forms of dyspnea: Secondary | ICD-10-CM

## 2019-07-22 DIAGNOSIS — G8929 Other chronic pain: Secondary | ICD-10-CM

## 2019-07-22 DIAGNOSIS — E559 Vitamin D deficiency, unspecified: Secondary | ICD-10-CM

## 2019-07-22 MED ORDER — VITAMIN D (ERGOCALCIFEROL) 1.25 MG (50000 UNIT) PO CAPS: 50000.0000 [IU] | ORAL_CAPSULE | ORAL | 1 refills | Status: DC

## 2019-07-22 MED ORDER — ALBUTEROL SULFATE HFA 108 (90 BASE) MCG/ACT IN AERS: 2.0000 | INHALATION_SPRAY | Freq: Four times a day (QID) | RESPIRATORY_TRACT | 0 refills | Status: DC | PRN

## 2019-07-22 NOTE — Progress Notes (Signed)
This visit occurred during the SARS-CoV-2 public health emergency.  Safety protocols were in place, including screening questions prior to the visit, additional usage of staff PPE, and extensive cleaning of exam room while observing appropriate contact time as indicated for disinfecting solutions.  Subjective:     Patient ID: [name] , female    DOB: [birth year] , 44 y.o.   MRN: [id number]   Chief Complaint  Patient presents with  . Dysphagia    HPI  Here for follow up from her first visit She is scheduled to see Dr. Craige [name] tomorrow - pulmonary issues.   Continues to have dysphagia     Shoulder Pain  Pain location: right shoulder pain with swelling  This is a chronic problem. The current episode started more than 1 year ago (she used to go to [facility] - has had 2 previous surgeries.). There has been no history of extremity trauma. The problem occurs constantly. The problem has been unchanged. Associated symptoms include joint swelling and a limited range of motion. Pertinent negatives include no fever, numbness or stiffness. She has tried NSAIDS (she is taking ibuprofen daily ) for the symptoms. Family history does not include gout. There is no history of osteoarthritis.     Past Medical History:  Diagnosis Date  . Allergy   . Anemia   . Angio-edema   . Anxiety   . Arthritis   . Constipation   . GERD (gastroesophageal reflux disease)   . Hx of endometriosis   . Insomnia   . Macromastia 07/2011  . Migraine   . Urticaria      Family History  Problem Relation Age of Onset  . Diabetes Mother   . Hypertension Mother   . Thyroid disease Sister   . Heart attack Maternal Grandmother   . Stroke Maternal Grandfather   . Colon cancer Neg Hx   . Colon polyps Neg Hx   . Esophageal cancer Neg Hx   . Rectal cancer Neg Hx   . Stomach cancer Neg Hx      Current Outpatient Medications:  .  acetaminophen (TYLENOL) 325 MG tablet, Take 650 mg by mouth every 6 (six) hours  as needed for mild pain or headache., Disp: , Rfl:  .  cetirizine (ZYRTEC) 10 MG tablet, Take 1 tablet (10 mg total) by mouth daily. (Patient taking differently: Take 10 mg by mouth daily as needed for allergies or rhinitis. ), Disp: 30 tablet, Rfl: 5 .  esomeprazole (NEXIUM) 40 MG capsule, TAKE 1 CAPSULE BY MOUTH EVERY MORNING (Patient taking differently: Take 40 mg by mouth in the morning), Disp: 90 capsule, Rfl: 1 .  ferrous sulfate 325 (65 FE) MG EC tablet, Take 1 tablet (325 mg total) by mouth 2 (two) times daily with a meal., Disp: 60 tablet, Rfl: 3 .  ibuprofen (ADVIL,MOTRIN) 200 MG tablet, Take 200-400 mg by mouth every 6 (six) hours as needed for headache or mild pain., Disp: , Rfl:  .  loratadine (CLARITIN) 10 MG tablet, Take 1 tablet (10 mg total) by mouth daily., Disp: 30 tablet, Rfl: 11 .  polyethylene glycol (MIRALAX / GLYCOLAX) packet, Take 17 g by mouth daily as needed for mild constipation. , Disp: , Rfl:    No Known Allergies   Review of Systems  Constitutional: Negative for fatigue and fever.  Respiratory: Positive for shortness of breath (on exertion). Negative for wheezing.   Cardiovascular: Negative.   Musculoskeletal: Positive for arthralgias (right shoulder pain). Negative for stiffness.  Neurological: Negative for dizziness and numbness.  Psychiatric/Behavioral: Negative.      Today's Vitals   07/22/19 1415  BP: 126/80  Pulse: 90  Temp: 98.3 F (36.8 C)  TempSrc: Oral  Weight: 239 lb 12.8 oz (108.8 kg)  Height: 5' 4.4" (1.636 m)  PainSc: 0-No pain   Body mass index is 40.65 kg/m.   Objective:  Physical Exam Constitutional:      Appearance: Normal appearance. She is well-developed.  Cardiovascular:     Rate and Rhythm: Normal rate and regular rhythm.  Pulmonary:     Breath sounds: No decreased breath sounds.  Skin:    General: Skin is warm and dry.     Capillary Refill: Capillary refill takes less than 2 seconds.  Neurological:     General: No  focal deficit present.     Mental Status: She is alert.  Psychiatric:        Mood and Affect: Mood normal. Mood is not anxious.        Behavior: Behavior normal.         Assessment And Plan:     1. Other dysphagia  At this time she does not want a referral to GI  2. Chronic right shoulder pain  Reports having 2 surgeries in the past  Tenderness to acromion process area will refer to orthopedics for further evaluation - Ambulatory referral to Orthopedic Surgery  3. Dyspnea on exertion  No abnormal findings on physical exam  She is scheduled to see Dr [name] tomorrow - albuterol (VENTOLIN HFA) 108 (90 Base) MCG/ACT inhaler; Inhale 2 puffs into the lungs every 6 (six) hours as needed for wheezing or shortness of breath.  Dispense: 18 g; Refill: 0  4. Vitamin D deficiency  Will check vitamin D level and supplement as needed.     Also encouraged to spend 15 minutes in the sun daily.   Rx sent to pharmacy for vitamin d - Vitamin D, Ergocalciferol, (DRISDOL) 1.25 MG (50000 UNIT) CAPS capsule; Take 1 capsule (50,000 Units total) by mouth 2 (two) times a week.  Dispense: 24 capsule; Refill: 1   [name], FNP    THE PATIENT IS ENCOURAGED TO PRACTICE SOCIAL DISTANCING DUE TO THE COVID-19 PANDEMIC.

## 2019-07-23 ENCOUNTER — Ambulatory Visit (INDEPENDENT_AMBULATORY_CARE_PROVIDER_SITE_OTHER): Payer: 59 | Admitting: Internal Medicine

## 2019-07-23 ENCOUNTER — Encounter: Payer: Self-pay | Admitting: Internal Medicine

## 2019-07-23 VITALS — BP 116/68 | HR 96 | Temp 97.7°F | Ht 64.0 in | Wt 239.0 lb

## 2019-07-23 DIAGNOSIS — R1110 Vomiting, unspecified: Secondary | ICD-10-CM | POA: Diagnosis not present

## 2019-07-23 DIAGNOSIS — J4540 Moderate persistent asthma, uncomplicated: Secondary | ICD-10-CM | POA: Diagnosis not present

## 2019-07-23 DIAGNOSIS — R0602 Shortness of breath: Secondary | ICD-10-CM | POA: Diagnosis not present

## 2019-07-23 MED ORDER — FLUTICASONE-SALMETEROL 250-50 MCG/DOSE IN AEPB: 1.0000 | INHALATION_SPRAY | Freq: Two times a day (BID) | RESPIRATORY_TRACT | 5 refills | Status: DC

## 2019-07-23 NOTE — Patient Instructions (Addendum)
The patient should have follow up scheduled with myself in 2 months.   Prior to next visit patient should have: Spirometry/Feno  Instructions For Advair Diskus Take the ADVAIR DISKUS out of the box and foil overwrap pouch. Write the "Pouch opened" and "Use by" dates on the label on top of the DISKUS. The "Use by" date is 1 month from date of opening the pouch.     * The DISKUS will be in the closed position when the pouch is opened.     * The dose indicator on the top of the DISKUS tells you how many doses are left. The dose indicator number will decrease each time you use the DISKUS. After you have used 55 doses from the DISKUS, the numbers 5 to 0 will appear in red to warn you that there are only a few doses left. If you are using a "sample" DISKUS, the numbers 5 to 0 will appear in red after 23 doses.  Taking a dose from the DISKUS requires the following 3 simple steps: Open, Click, Inhale.  1. OPEN Hold the DISKUS in one hand and put the thumb of your other hand on the thumbgrip. Push your thumb away from you as far as it will go until the mouthpiece appears and snaps into position.  2.   CLICK Hold the DISKUS in a level, flat position with the mouthpiece toward you. Slide the lever away from you as far as it will go until it clicks. The DISKUS is now ready to use. Every time the lever is pushed back, a dose is ready to be inhaled. This is shown by a decrease in numbers on the dose counter. To avoid releasing or wasting doses once the DISKUS is ready: Do not close the DISKUS. Do not tilt the DISKUS. Do not play with the lever. Do not move the lever more than once.  3.   INHALE Before inhaling your dose from the DISKUS, breathe out (exhale) fully while holding the DISKUS level and away from your mouth. Remember, never breathe out into the DISKUS mouthpiece.  Put the mouthpiece to your lips. Breathe in quickly and deeply through the DISKUS. Do not breathe in through your nose.  Remove  the DISKUS from your mouth. Hold your breath for about 10 seconds, or for as long as is comfortable. Breathe out slowly.  The DISKUS delivers your dose of medicine as a very fine powder. Most patients can taste or feel the powder. Do not use another dose from the DISKUS if you do not feel or taste the medicine.  Rinse your mouth with water after breathing in the medicine. Spit the water out. Do not swallow.  4.  CLOSE the DISKUS when you are finished taking a dose so that the DISKUS will be ready for you to take your next dose. Put your thumb on the thumbgrip and slide the thumbgrip back toward you as far as it will go. The DISKUS will click shut. The lever will automatically return to its original position. The DISKUS is now ready for you to take your next scheduled dose, due in about 12 hours. (Repeat the steps 1 to 4.)  REMEMBER: Never breathe into the DISKUS. Never take the DISKUS apart. Always ready and use the DISKUS in a level, flat position. Do not use the DISKUS with a spacer device. After each dose, rinse your mouth with water and spit the water out. Do not swallow. Never wash the mouthpiece or any part  of the DISKUS. Keep it dry. Always keep the DISKUS in a dry place. Never take an extra dose, even if you did not taste or feel the medicine.  By learning about asthma and how it can be controlled, you take an important step toward managing this disease. Work closely with your asthma care team to learn all you can about your asthma, how to avoid triggers, what your medications do, and how to take them correctly. With proper care, you can live free of asthma symptoms and maintain a normal, healthy lifestyle.   What is asthma? Asthma is a chronic disease that affects the airways of the lungs. During normal breathing, the bands of muscle that surround the airways are relaxed and air moves freely. During an asthma episode or "attack," there are three main changes that stop air from moving  easily through the airways:  The bands of muscle that surround the airways tighten and make the airways narrow. This tightening is called bronchospasm.   The lining of the airways becomes swollen or inflamed.   The cells that line the airways produce more mucus, which is thicker than normal and clogs the airways.  These three factors - bronchospasm, inflammation, and mucus production - cause symptoms such as difficulty breathing, wheezing, and coughing.  What are the most common symptoms of asthma? Asthma symptoms are not the same for everyone. They can even change from episode to episode in the same person. Also, you may have only one symptom of asthma, such as cough, but another person may have all the symptoms of asthma. It is important to know all the symptoms of asthma and to be aware that your asthma can present in any of these ways at any time. The most common symptoms include: . Coughing, especially at night  . Shortness of breath  . Wheezing  . Chest tightness, pain, or pressure   Who is affected by asthma? Asthma affects 22 million Americans; about 6 million of these are children under age 71. People who have a family history of asthma have an increased risk of developing the disease. Asthma is also more common in people who have allergies or who are exposed to tobacco smoke. However, anyone can develop asthma at any time. Some people may have asthma all of their lives, while others may develop it as adults.  What causes asthma? The airways in a person with asthma are very sensitive and react to many things, or "triggers." Contact with these triggers causes asthma symptoms. One of the most important parts of asthma control is to identify your triggers and then avoid them when possible. The only trigger you do not want to avoid is exercise. Pre-treatment with medicines before exercise can allow you to stay active yet avoid asthma symptoms. Common asthma triggers include: 1. Infections  (colds, viruses, flu, sinus infections)  2. Exercise  3. Weather (changes in temperature and/or humidity, cold air)  4. Tobacco smoke  5. Allergens (dust mites, pollens, pets, mold spores, cockroaches, and sometimes foods)  6. Irritants (strong odors from cleaning products, perfume, wood smoke, air pollution)  7. Strong emotions such as crying or laughing hard  8. Some medications   How is asthma diagnosed? To diagnose asthma, your doctor will first review your medical history, family history, and symptoms. Your doctor will want to know any past history of breathing problems you may have had, as well as a family history of asthma, allergies, eczema (a bumpy, itchy skin rash caused by allergies),  or other lung disease. It is important that you describe your symptoms in detail (cough, wheeze, shortness of breath, chest tightness), including when and how often they occur. The doctor will perform a physical examination and listen to your heart and lungs. He or she may also order breathing tests, allergy tests, blood tests, and chest and sinus X-rays. The tests will find out if you do have asthma and if there are any other conditions that are contributing factors.  How is asthma treated? Asthma can be controlled, but not cured. It is not normal to have frequent symptoms, trouble sleeping, or trouble completing tasks. Appropriate asthma care will prevent symptoms and visits to the emergency room and hospital. Asthma medicines are one of the mainstays of asthma treatment. The drugs used to treat asthma are explained below.  Anti-inflammatories: These are the most important drugs for most people with asthma. Anti-inflammatory drugs reduce swelling and mucus production in the airways. As a result, airways are less sensitive and less likely to react to triggers. These medications need to be taken daily and may need to be taken for several weeks before they begin to control asthma. Anti-inflammatory medicines  lead to fewer symptoms, better airflow, less sensitive airways, less airway damage, and fewer asthma attacks. If taken every day, they CONTROL or prevent asthma symptoms.   Bronchodilators: These drugs relax the muscle bands that tighten around the airways. This action opens the airways, letting more air in and out of the lungs and improving breathing. Bronchodilators also help clear mucus from the lungs. As the airways open, the mucus moves more freely and can be coughed out more easily. In short-acting forms, bronchodilators RELIEVE or stop asthma symptoms by quickly opening the airways and are very helpful during an asthma episode. In long-acting forms, bronchodilators provide CONTROL of asthma symptoms and prevent asthma episodes.  Asthma drugs can be taken in a variety of ways. Inhaling the medications by using a metered dose inhaler, dry powder inhaler, or nebulizer is one way of taking asthma medicines. Oral medicines (pills or liquids you swallow) may also be prescribed.  Asthma severity Asthma is classified as either "intermittent" (comes and goes) or "persistent" (lasting). Persistent asthma is further described as being mild, moderate, or severe. The severity of asthma is based on how often you have symptoms both during the day and night, as well as by the results of lung function tests and by how well you can perform activities. The "severity" of asthma refers to how "intense" or "strong" your asthma is.  Asthma control Asthma control is the goal of asthma treatment. Regardless of your asthma severity, it may or may not be controlled. Asthma control means: . You are able to do everything you want to do at work and home  . You have no (or minimal) asthma symptoms  . You do not wake up from your sleep or earlier than usual in the morning due to asthma  . You rarely need to use your reliever medicine (inhaler)  Another major part of your treatment is that you are happy with your asthma care  and believe your asthma is controlled.  Monitoring symptoms A key part of treatment is keeping track of how well your lungs are working. Monitoring your symptoms  what they are, how and when they happen, and how severe they are  is an important part of being able to control your asthma.  Sometimes asthma is monitored using a peak flow meter. A peak flow (PF)  meter measures how fast the air comes out of your lungs. It can help you know when your asthma is getting worse, sometimes even before you have symptoms. By taking daily peak flow readings, you can learn when to adjust medications to keep asthma under good control. It is also used to create your asthma action plan (see below). Your doctor can use your peak flow readings to adjust your treatment plan in some cases.  Asthma Action Plan Based on your history and asthma severity, you and your doctor will develop a care plan called an "asthma action plan." The asthma action plan describes when and how to use your medicines, actions to take when asthma worsens, and when to seek emergency care. Make sure you understand this plan. If you do not, ask your asthma care provider any questions you may have. Your asthma action plan is one of the keys to controlling asthma. Keep it readily available to remind you of what you need to do every day to control asthma and what you need to do when symptoms occur.  Goals of asthma therapy These are the goals of asthma treatment: . Live an active, normal life  . Prevent chronic and troublesome symptoms  . Attend work or school every day  . Perform daily activities without difficulty  . Stop urgent visits to the doctor, emergency department, or hospital  . Use and adjust medications to control asthma with few or no side effects

## 2019-07-23 NOTE — Progress Notes (Signed)
[name]    [id number]    [birth year]  Primary Care Physician:Moore, Lolita Cram, FNP  Referring Physician: [name], FNP [address] [place],  [place] [address] Reason for Consultation: "shortness of breath" Date of Consultation: 07/23/2019  Chief complaint:   Chief Complaint  Patient presents with  . Consult    Patient is here for shortness of breath for the last 3 years but noticed it has recently got worse. 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 Patient is also having issues with swallowing and states that her throat is constantly dry. History of anemia     HPI: Shortness of breath started in 2018 when she went to the gym. Was given albuterol as needed before exercise. Usually when she goes up and down stairs. Did have some relief with that.  She used her sons steroid inhaler and that helped her a lot. Has also been  She also has problems with swallowing and chest pains.  She did have childhood bronchitis.   Current Regimen: nothing Asthma Triggers: exercise, talking for a long time.  Exacerbations in the last year: 1 (previously has gone multiple times a year since 2018.) History of hospitalization or intubation: Allergy Testing: in 2019 environmental allergy skin prick testing is positive to box elder, dust mites and cockroach.  Select food allergy skin prick testing was positive to salmon GERD: yes Allergic Rhinitis: denies ACT: No flowsheet data found. FeNO: never had.   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  Exposures: lives at home with her son Smoking history: never smoker  Social History   Occupational History    Employer: OTHER    Comment: [employer]  Tobacco Use  . Smoking status: Never Smoker  . Smokeless tobacco: Never Used  Substance and Sexual Activity  . Alcohol use: Yes    Comment: occasional   . Drug use: No  . Sexual activity: Not on file    Relevant family  history:  Family History  Problem Relation Age of Onset  . Diabetes Mother   . Hypertension Mother   . Thyroid disease Sister   . Heart attack Maternal Grandmother   . Stroke Maternal Grandfather   . Asthma Son   . Asthma Daughter   . Colon cancer Neg Hx   . Colon polyps Neg Hx   . Esophageal cancer Neg Hx   . Rectal cancer Neg Hx   . Stomach cancer Neg Hx     Past Medical History:  Diagnosis Date  . Allergy   . Anemia   . Angio-edema   . Anxiety   . Arthritis   . Constipation   . GERD (gastroesophageal reflux disease)   . Hx of endometriosis   . Insomnia   . Macromastia 07/2011  . Migraine   . Urticaria     Past Surgical History:  Procedure Laterality Date  . 24 HOUR PH STUDY N/A 01/12/2018   Procedure: 24 HOUR PH STUDY;  Surgeon: Napoleon Form, MD;  Location: [facility];  Service: Endoscopy;  Laterality: N/A;  . ABDOMINAL HYSTERECTOMY  12/12/2003  . ADENOIDECTOMY  05/2011  . ANKLE ARTHROSCOPY  12/29/2007   right; with extensive debridement  . BLADDER NECK RECONSTRUCTION  01/14/2011   Procedure: BLADDER NECK REPAIR;  Surgeon: Reva Bores, MD;  Location: [facility];  Service: Gynecology;  Laterality: N/A;  Laparoscopic Repair of Incidental Cystotomy  . BREAST REDUCTION SURGERY  08/05/2011  Procedure: MAMMARY REDUCTION  (BREAST);  Surgeon: Louisa Second, MD;  Location: [facility];  Service: Plastics;  Laterality: Bilateral;  bilateral  . CESAREAN SECTION  12/29/2000; 1994  . DILATION AND CURETTAGE OF UTERUS  07/08/2003   open laparoscopy  . ESOPHAGEAL MANOMETRY N/A 01/12/2018   Procedure: ESOPHAGEAL MANOMETRY (EM);  Surgeon: Napoleon Form, MD;  Location: [facility];  Service: Endoscopy;  Laterality: N/A;  . GIVENS CAPSULE STUDY N/A 06/23/2017   Procedure: GIVENS CAPSULE STUDY;  Surgeon: Benancio Deeds, MD;  Location: [facility];  Service: Gastroenterology;  Laterality: N/A;  . LAPAROSCOPIC SALPINGOOPHERECTOMY  01/14/2011   left  . PH  IMPEDANCE STUDY N/A 01/12/2018   Procedure: PH IMPEDANCE STUDY;  Surgeon: Napoleon Form, MD;  Location: [facility];  Service: Endoscopy;  Laterality: N/A;  . REPAIR PERONEAL TENDONS ANKLE  01/03/2010   repair right subluxing peroneal tendons  . RESECTION DISTAL CLAVICAL Right 09/25/2017   Procedure: RESECTION DISTAL CLAVICAL;  Surgeon: Bjorn Pippin, MD;  Location: [facility];  Service: Orthopedics;  Laterality: Right;  . RIGHT OOPHORECTOMY     with lysis of adhesions  . SHOULDER ACROMIOPLASTY Right 09/25/2017   Procedure: SHOULDER ACROMIOPLASTY;  Surgeon: Bjorn Pippin, MD;  Location: [facility];  Service: Orthopedics;  Laterality: Right;  . SHOULDER ARTHROSCOPY WITH DEBRIDEMENT AND BICEP TENDON REPAIR Right 09/25/2017   Procedure: SHOULDER ARTHROSCOPY WITH DEBRIDEMENT AND BICEP TENDON REPAIR;  Surgeon: Bjorn Pippin, MD;  Location: [facility];  Service: Orthopedics;  Laterality: Right;  . SHOULDER ARTHROSCOPY WITH ROTATOR CUFF REPAIR Right 09/25/2017   Procedure: SHOULDER ARTHROSCOPY WITH ROTATOR CUFF REPAIR;  Surgeon: Bjorn Pippin, MD;  Location:  SURGERY CENTER;  Service: Orthopedics;  Laterality: Right;  . TONSILLECTOMY  as a child  . TUBAL LIGATION  12/29/2000     Review of systems: Review of Systems  Constitutional: Negative for chills and fever.  HENT: Positive for sore throat. Negative for congestion and sinus pain.   Eyes: Negative for discharge and redness.  Respiratory: Positive for cough, shortness of breath and wheezing. Negative for hemoptysis.   Cardiovascular: Negative for chest pain and palpitations.  Gastrointestinal: Positive for heartburn. Negative for nausea and vomiting.  Genitourinary: Negative.   Musculoskeletal: Negative for myalgias.  Skin: Negative for rash.  Neurological: Negative for focal weakness.  Endo/Heme/Allergies: Positive for environmental allergies.  Psychiatric/Behavioral: The patient is  not nervous/anxious.   All other systems reviewed and are negative.   Physical Exam: Blood pressure 116/68, pulse 96, temperature 97.7 F (36.5 C), temperature source Temporal, height 5\' 4"  (1.626 m), weight 239 lb (108.4 kg), last menstrual period 12/27/2003, SpO2 100 %. Gen:      No acute distress ENT:  no nasal polyps, mucus membranes moist Lungs:    No increased respiratory effort, symmetric chest wall excursion, clear to auscultation bilaterally, no wheezes or crackles CV:         Regular rate and rhythm; no murmurs, rubs, or gallops.  No pedal edema Abd:      + bowel sounds; soft, non-tender; no distension MSK: no acute synovitis of DIP or PIP joints, no mechanics hands Skin:      Warm and dry; no rashes Neuro: normal speech, no focal facial asymmetry Psych: alert and oriented x3, normal mood and affect   Data Reviewed/Medical Decision Making:  Independent interpretation of tests: Imaging: . Review of patient's chest xray Jan 2021 images revealed no acute cardiopulmonary  process. The patient's images have been independently reviewed by me.    PFTs: I have personally reviewed the patient's PFTs and from 2018 spirometry, lung volumes and diffusion capacity are normal.  PFT Results Latest Ref Rng & Units 03/12/2017  FVC-Pre L 3.52  FVC-Predicted Pre % 114  FVC-Post L 3.25  FVC-Predicted Post % 106  Pre FEV1/FVC % % 79  Post FEV1/FCV % % 86  FEV1-Pre L 2.77  FEV1-Predicted Pre % 110  FEV1-Post L 2.78  DLCO UNC% % 97  DLCO COR %Predicted % 113  TLC L 4.54  TLC % Predicted % 89  RV % Predicted % 78    Labs:  IgE 33, environmental IgE panel negative (immunocap) in 2019.  Lab Results  Component Value Date   WBC 3.9 06/17/2019   HGB 11.3 06/17/2019   HCT 36.4 06/17/2019   MCV 78 (L) 06/17/2019   PLT 339 06/17/2019   Lab Results  Component Value Date   NA 140 06/17/2019   K 4.3 06/17/2019   CL 101 06/17/2019   CO2 25 06/17/2019     Immunization status:   Immunization History  Administered Date(s) Administered  . Influenza,inj,Quad PF,6+ Mos 02/15/2016, 12/24/2017  . Td 05/06/1994  . Tdap 12/06/2015    . I reviewed prior external note(s) from GI, ED visits . I reviewed the result(s) of the labs and imaging as noted above.  . I have ordered spiro/feno    Assessment:  Dyspnea on exertion - suspect moderate persistent asthma.  Esophageal hypersensitivity and regurgitation  Plan/Recommendations: Start advair Continue albuterol as needed Obtain spirometry and FeNO We discussed disease management and progression at length today.   Return to Care: Return in about 2 months (around 09/22/2019).  Lenice Llamas, MD Pulmonary and [place]  CC: [name], FNP

## 2019-08-03 ENCOUNTER — Encounter: Payer: Self-pay | Admitting: Physician Assistant

## 2019-08-03 ENCOUNTER — Ambulatory Visit (INDEPENDENT_AMBULATORY_CARE_PROVIDER_SITE_OTHER): Payer: 59

## 2019-08-03 ENCOUNTER — Ambulatory Visit (INDEPENDENT_AMBULATORY_CARE_PROVIDER_SITE_OTHER): Payer: 59 | Admitting: Orthopaedic Surgery

## 2019-08-03 ENCOUNTER — Other Ambulatory Visit: Payer: Self-pay

## 2019-08-03 DIAGNOSIS — M5412 Radiculopathy, cervical region: Secondary | ICD-10-CM

## 2019-08-03 DIAGNOSIS — M25511 Pain in right shoulder: Secondary | ICD-10-CM

## 2019-08-03 DIAGNOSIS — G8929 Other chronic pain: Secondary | ICD-10-CM

## 2019-08-03 MED ORDER — PREDNISONE 5 MG (21) PO TBPK: ORAL_TABLET | ORAL | 0 refills | Status: DC

## 2019-08-03 MED ORDER — TIZANIDINE HCL 2 MG PO TABS: 2.0000 mg | ORAL_TABLET | Freq: Two times a day (BID) | ORAL | 0 refills | Status: DC | PRN

## 2019-08-03 NOTE — Progress Notes (Signed)
Office Visit Note   Patient: [name]           Date of Birth: Oct 05, 1975           MRN: [id number] Visit Date: 08/03/2019              Requested by: [name], FNP [address] [place],  [place] [address] PCP: [name], FNP   Assessment & Plan: Visit Diagnoses:  1. Radiculopathy of cervical spine   2. Chronic right shoulder pain     Plan: Impression is right sided cervical spine radiculopathy.  Have called in a Sterapred taper muscle relaxer.  We will also start the patient in physical therapy for this.  Internal referral has been made.  She will follow up with [place] as needed.  Follow-Up Instructions: Return if symptoms worsen or fail to improve.   Orders:  Orders Placed This Encounter  Procedures   XR Shoulder Right   XR Cervical Spine 2 or 3 views   Ambulatory referral to Physical Therapy   Meds ordered this encounter  Medications   predniSONE (STERAPRED UNI-PAK 21 TAB) 5 MG (21) TBPK tablet    Sig: Take as directed    Dispense:  21 tablet    Refill:  0   tiZANidine (ZANAFLEX) 2 MG tablet    Sig: Take 1 tablet (2 mg total) by mouth 2 (two) times daily as needed for muscle spasms.    Dispense:  20 tablet    Refill:  0      Procedures: No procedures performed   Clinical Data: No additional findings.   Subjective: Chief Complaint  Patient presents with   Right Shoulder - Pain    HPI patient is a pleasant 44 year old female who comes in today with right shoulder pain.  She is status post remote right shoulder surgery several years ago and then what sounds like decompression to the right shoulder by Dr. Everardo Pacific about 2 years ago.  She notes that her symptoms improved for a few months following surgery but then again returned.  She was sent to physical therapy which did not help at all.  She comes in today for further evaluation treatment recommendation.  The pain she has is to the right parascapular region.  She feels as though  this is a continuously tight area and is worse with full forward flexion of the shoulder.  No numbness, tingling or burning to her right upper extremity.  No previous cervical spine pathology.  No previous cortisone injections to the right shoulder following surgical intervention.  Review of Systems as detailed in HPI.  All others reviewed and are negative.   Objective: Vital Signs: LMP 12/27/2003   Physical Exam well-developed and well-nourished female no acute distress.  Alert and oriented x3.  Ortho Exam examination of the right shoulder reveals full active range of motion all planes without pain.  Negative empty can and cross body adduction.  Full strength throughout.  Cervical spine shows no spinous or paraspinous tenderness.  She does have slight pain over the trapezius as well as moderate tenderness along the right-sided parascapular trigger points.  She is neurovascularly intact distally.  Specialty Comments:  No specialty comments available.  Imaging: XR Cervical Spine 2 or 3 views  Result Date: 08/03/2019 Abnormal straightening of the cervical spine.  XR Shoulder Right  Result Date: 08/03/2019 No acute or structural abnormalities    PMFS History: Patient Active Problem List   Diagnosis Date Noted  Generalized anxiety disorder 04/08/2018   Atypical chest pain    Regurgitation of food    Laryngopharyngeal reflux (LPR) 07/30/2017   Throat soreness 07/30/2017   ANA positive 12/30/2016   Gastroesophageal reflux disease 06/27/2016   Disturbance of skin sensation 04/27/2014   Dizziness and giddiness 03/14/2014   Headache, migraine 03/14/2014   Myalgia and myositis, unspecified 06/07/2013   Nausea and vomiting in adult 08/22/2012   Viral syndrome 08/22/2012   Pelvic pain in female 12/18/2010   PID (acute pelvic inflammatory disease) 12/18/2010   ANEMIA-IRON DEFICIENCY 10/15/2006   HERPES, GENITAL NOS 09/30/2006   OBESITY NOS 09/30/2006   DISORDER,  NONORGANIC SLEEP NOS 09/30/2006   CONSTIPATION NOS 09/30/2006   NEURITIS, LUMBOSACRAL NOS 09/30/2006   HX, PERSONAL, MENTAL DISORDER NOS 09/30/2006   Past Medical History:  Diagnosis Date   Allergy    Anemia    Angio-edema    Anxiety    Arthritis    Constipation    GERD (gastroesophageal reflux disease)    Hx of endometriosis    Insomnia    Macromastia 07/2011   Migraine    Urticaria     Family History  Problem Relation Age of Onset   Diabetes Mother    Hypertension Mother    Thyroid disease Sister    Heart attack Maternal Grandmother    Stroke Maternal Grandfather    Asthma Son    Asthma Daughter    Colon cancer Neg Hx    Colon polyps Neg Hx    Esophageal cancer Neg Hx    Rectal cancer Neg Hx    Stomach cancer Neg Hx     Past Surgical History:  Procedure Laterality Date   41 HOUR [place] STUDY N/A 01/12/2018   Procedure: 24 HOUR PH STUDY;  Surgeon: Mauri Pole, MD;  Location: [facility];  Service: Endoscopy;  Laterality: N/A;   ABDOMINAL HYSTERECTOMY  12/12/2003   ADENOIDECTOMY  05/2011   ANKLE ARTHROSCOPY  12/29/2007   right; with extensive debridement   BLADDER NECK RECONSTRUCTION  01/14/2011   Procedure: BLADDER NECK REPAIR;  Surgeon: Donnamae Jude, MD;  Location: [facility];  Service: Gynecology;  Laterality: N/A;  Laparoscopic Repair of Incidental Cystotomy   BREAST REDUCTION SURGERY  08/05/2011   Procedure: MAMMARY REDUCTION  (BREAST);  Surgeon: Cristine Polio, MD;  Location: [facility];  Service: Plastics;  Laterality: Bilateral;  bilateral   CESAREAN SECTION  12/29/2000; [place]  07/08/2003   open laparoscopy   ESOPHAGEAL MANOMETRY N/A 01/12/2018   Procedure: ESOPHAGEAL MANOMETRY (EM);  Surgeon: Mauri Pole, MD;  [facility]: [facility];  Service: Endoscopy;  Laterality: N/A;   GIVENS CAPSULE STUDY N/A 06/23/2017   Procedure: GIVENS CAPSULE STUDY;  Surgeon: Yetta Flock,  MD;  Location: [facility];  Service: Gastroenterology;  Laterality: N/A;   LAPAROSCOPIC SALPINGOOPHERECTOMY  01/14/2011   left   PH IMPEDANCE STUDY N/A 01/12/2018   Procedure: [place] IMPEDANCE STUDY;  Surgeon: Mauri Pole, MD;  Location: [facility];  Service: Endoscopy;  Laterality: N/A;   REPAIR PERONEAL TENDONS ANKLE  01/03/2010   repair right subluxing peroneal tendons   RESECTION DISTAL CLAVICAL Right 09/25/2017   Procedure: RESECTION DISTAL CLAVICAL;  Surgeon: Hiram Gash, MD;  Location: [facility];  Service: Orthopedics;  Laterality: Right;   RIGHT OOPHORECTOMY     with lysis of adhesions   SHOULDER ACROMIOPLASTY Right 09/25/2017   Procedure: SHOULDER ACROMIOPLASTY;  Surgeon: Griffin Basil,  Murriel Hopper, MD;  Location: [facility];  Service: Orthopedics;  Laterality: Right;   SHOULDER ARTHROSCOPY WITH DEBRIDEMENT AND BICEP TENDON REPAIR Right 09/25/2017   Procedure: SHOULDER ARTHROSCOPY WITH DEBRIDEMENT AND BICEP TENDON REPAIR;  Surgeon: Bjorn Pippin, MD;  Location: [facility];  Service: Orthopedics;  Laterality: Right;   SHOULDER ARTHROSCOPY WITH ROTATOR CUFF REPAIR Right 09/25/2017   Procedure: SHOULDER ARTHROSCOPY WITH ROTATOR CUFF REPAIR;  Surgeon: Bjorn Pippin, MD;  Location: [facility];  Service: Orthopedics;  Laterality: Right;   TONSILLECTOMY  as a child   TUBAL LIGATION  12/29/2000   Social History   Occupational History    Employer: OTHER    Comment: [employer]  Tobacco Use   Smoking status: Never Smoker   Smokeless tobacco: Never Used  Substance and Sexual Activity   Alcohol use: Yes    Comment: occasional    Drug use: No   Sexual activity: Not on file

## 2019-08-09 ENCOUNTER — Encounter: Payer: Self-pay | Admitting: Physical Therapy

## 2019-08-09 ENCOUNTER — Emergency Department (HOSPITAL_COMMUNITY): Payer: 59

## 2019-08-09 ENCOUNTER — Other Ambulatory Visit: Payer: Self-pay

## 2019-08-09 ENCOUNTER — Encounter (HOSPITAL_COMMUNITY): Payer: Self-pay

## 2019-08-09 ENCOUNTER — Ambulatory Visit: Payer: 59 | Admitting: Physical Therapy

## 2019-08-09 DIAGNOSIS — M25551 Pain in right hip: Secondary | ICD-10-CM | POA: Diagnosis present

## 2019-08-09 DIAGNOSIS — M542 Cervicalgia: Secondary | ICD-10-CM

## 2019-08-09 DIAGNOSIS — M546 Pain in thoracic spine: Secondary | ICD-10-CM | POA: Diagnosis not present

## 2019-08-09 DIAGNOSIS — M25511 Pain in right shoulder: Secondary | ICD-10-CM | POA: Diagnosis not present

## 2019-08-09 DIAGNOSIS — M6790 Unspecified disorder of synovium and tendon, unspecified site: Secondary | ICD-10-CM | POA: Diagnosis not present

## 2019-08-09 DIAGNOSIS — R0789 Other chest pain: Secondary | ICD-10-CM | POA: Diagnosis not present

## 2019-08-09 DIAGNOSIS — G8929 Other chronic pain: Secondary | ICD-10-CM | POA: Diagnosis not present

## 2019-08-09 DIAGNOSIS — Z79899 Other long term (current) drug therapy: Secondary | ICD-10-CM | POA: Insufficient documentation

## 2019-08-09 DIAGNOSIS — R0602 Shortness of breath: Secondary | ICD-10-CM | POA: Insufficient documentation

## 2019-08-09 MED ORDER — SODIUM CHLORIDE 0.9% FLUSH: 3.0000 mL | Freq: Once | INTRAVENOUS | Status: DC

## 2019-08-09 NOTE — Patient Instructions (Signed)
Access Code: [email or web address]: [email or web address]: 04/05/2021Prepared by: Arlys John NelsonExercises  Seated Maurine Simmering with Neck Elongation - 2 x daily - 6 x weekly - 2-3 sets - 10 reps  Seated Cervical Sidebending Stretch - 2 x daily - 6 x weekly - 2-3 reps - 1 sets - 30 sec hold  Standing Row with Anchored Resistance - 2 x daily - 6 x weekly - 10-20 reps - 2-3 sets  Standing Shoulder Horizontal Abduction with Resistance - 2 x daily - 6 x weekly - 10 reps - 3 sets  Theracane Over Shoulder - 2 x daily - 6 x weekly - 10 reps - 3 sets Patient Education  TENS Therapy

## 2019-08-09 NOTE — ED Triage Notes (Signed)
Pt reports R leg pain in her hip. States that it started Friday. Also reports burning chest pain, headache and SOB.

## 2019-08-09 NOTE — Therapy (Signed)
[facility] [address] [place], [place], [address] Phone: [phone]   Fax:  [phone]  Physical Therapy Evaluation  Patient Details  Name: [name] MRN: [id number] Date of Birth: 1976-01-17 Referring Provider (PT): Cristie Hem, [place]   Encounter Date: 08/09/2019  PT End of Session - 08/09/19 1027    Visit Number  1    Number of Visits  12    Date for PT Re-Evaluation  09/20/19    Authorization Type  [employer]    PT Start Time  0850    PT Stop Time  0930    PT Time Calculation (min)  40 min    Activity Tolerance  Patient tolerated treatment well    Behavior During Therapy  [facility] for tasks assessed/performed       Past Medical History:  Diagnosis Date  . Allergy   . Anemia   . Angio-edema   . Anxiety   . Arthritis   . Constipation   . GERD (gastroesophageal reflux disease)   . Hx of endometriosis   . Insomnia   . Macromastia 07/2011  . Migraine   . Urticaria     Past Surgical History:  Procedure Laterality Date  . 24 HOUR PH STUDY N/A 01/12/2018   Procedure: 24 HOUR PH STUDY;  Surgeon: Napoleon Form, MD;  [facility]: [facility];  Service: Endoscopy;  Laterality: N/A;  . ABDOMINAL HYSTERECTOMY  12/12/2003  . ADENOIDECTOMY  05/2011  . ANKLE ARTHROSCOPY  12/29/2007   right; with extensive debridement  . BLADDER NECK RECONSTRUCTION  01/14/2011   Procedure: BLADDER NECK REPAIR;  Surgeon: Reva Bores, MD;  Location: [facility];  Service: Gynecology;  Laterality: N/A;  Laparoscopic Repair of Incidental Cystotomy  . BREAST REDUCTION SURGERY  08/05/2011   Procedure: MAMMARY REDUCTION  (BREAST);  Surgeon: Louisa Second, MD;  Location: [facility];  Service: Plastics;  Laterality: Bilateral;  bilateral  . CESAREAN SECTION  12/29/2000; 1994  . DILATION AND CURETTAGE OF UTERUS  07/08/2003   open laparoscopy  . ESOPHAGEAL MANOMETRY N/A 01/12/2018   Procedure: ESOPHAGEAL MANOMETRY (EM);  Surgeon: Napoleon Form, MD;  [facility];  Service: Endoscopy;  Laterality: N/A;  . GIVENS CAPSULE STUDY N/A 06/23/2017   Procedure: GIVENS CAPSULE STUDY;  Surgeon: Benancio Deeds, MD;  Location: [facility];  Service: Gastroenterology;  Laterality: N/A;  . LAPAROSCOPIC SALPINGOOPHERECTOMY  01/14/2011   left  . PH IMPEDANCE STUDY N/A 01/12/2018   Procedure: PH IMPEDANCE STUDY;  Surgeon: Napoleon Form, MD;  Location: [facility];  Service: Endoscopy;  Laterality: N/A;  . REPAIR PERONEAL TENDONS ANKLE  01/03/2010   repair right subluxing peroneal tendons  . RESECTION DISTAL CLAVICAL Right 09/25/2017   Procedure: RESECTION DISTAL CLAVICAL;  Surgeon: Bjorn Pippin, MD;  Location: [facility];  Service: Orthopedics;  Laterality: Right;  . RIGHT OOPHORECTOMY     with lysis of adhesions  . SHOULDER ACROMIOPLASTY Right 09/25/2017   Procedure: SHOULDER ACROMIOPLASTY;  Surgeon: Bjorn Pippin, MD;  Location: [facility];  Service: Orthopedics;  Laterality: Right;  . SHOULDER ARTHROSCOPY WITH DEBRIDEMENT AND BICEP TENDON REPAIR Right 09/25/2017   Procedure: SHOULDER ARTHROSCOPY WITH DEBRIDEMENT AND BICEP TENDON REPAIR;  Surgeon: Bjorn Pippin, MD;  Location: [facility];  Service: Orthopedics;  Laterality: Right;  . SHOULDER ARTHROSCOPY WITH ROTATOR CUFF REPAIR Right 09/25/2017   Procedure: SHOULDER ARTHROSCOPY WITH ROTATOR CUFF REPAIR;  Surgeon: Bjorn Pippin,  MD;  Location: [facility];  Service: Orthopedics;  Laterality: Right;  . TONSILLECTOMY  as a child  . TUBAL LIGATION  12/29/2000    There were no vitals filed for this visit.   Subjective Assessment - 08/09/19 0852    Subjective  she comes to PT with right shoulder pain.  She has had Rt shoulder scope, RTC repair 2 years ago.  She notes that her symptoms improved for a few months following surgery but then again returned. The pain she has is to the right parascapular region and Rt cervical  paraspinal region to her shoulder.  She feels as though this is a continuously tight area and is worse with full forward flexion of the shoulder or turning her head.  No numbness, tingling or burning to her right upper extremity.  No previous cervical spine pathology.    Pertinent History  PMH: Rt shoulder scope and RTC repair 2019, anx,obesity, OA    Limitations  Lifting    Diagnostic tests  Imaging: neck XR "abnormal straightening of C spine. Rt shoulder XR negative    Patient Stated Goals  get rid of the pain    Currently in Pain?  No/denies    Pain Score  7     Pain Location  Neck    Pain Orientation  Right    Pain Descriptors / Indicators  Aching    Pain Type  Chronic pain    Pain Radiating Towards  to Rt shoulder    Pain Onset  More than a month ago    Pain Frequency  Intermittent    Aggravating Factors   raising her Rt shoulder or using it too much ,driving, turning her head    Pain Relieving Factors  rest, laying down, heat    Multiple Pain Sites  No         [facility] PT Assessment - 08/09/19 0001      Assessment   Medical Diagnosis  cervical pain, Rt shoulder pain    Referring Provider (PT)  Cristie Hem, PA-C    Onset Date/Surgical Date  --   neck pain at least a year, Rt shoulder scope RTC 2 years ago   Next MD Visit  PRN    Prior Therapy  nothing recent      Precautions   Precautions  None      Restrictions   Weight Bearing Restrictions  No      Balance Screen   Has the patient fallen in the past 6 months  No      Home Environment   Living Environment  Private residence      Prior Function   Level of Independence  Independent    Vocation  Full time employment    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 with trigger points      Special Tests   Other special tests  neg spurlings test, neg impingement tests      Transfers   Transfers  Independent with all Transfers                Objective measurements completed on examination: See above findings.      [facility] Adult PT Treatment/Exercise - 08/09/19 0001      Modalities   Modalities  Electrical Stimulation;Moist Heat      Moist Heat Therapy   Number Minutes Moist Heat  10 Minutes    Moist Heat Location  Cervical      Electrical Stimulation   Electrical Stimulation Location  neck    Electrical Stimulation Action  IFC    Electrical Stimulation Parameters  tolerance    Electrical Stimulation Goals  Pain             PT Education - 08/09/19 1027    Education Details  HEP, POC,home TENS, theracane    Person(s) Educated  Patient    Methods  Explanation;Demonstration;Verbal cues;Handout    Comprehension  Verbalized understanding;Need further instruction          PT Long Term Goals - 08/09/19 1034      PT LONG TERM GOAL #1   Title  Pt will be I and compliant with HEP. (Target for all goals 6 weeks 09/20/19)    Time  6    Period  Weeks    Status  New      PT LONG TERM GOAL #2   Title  Pt will improve neck ROM to WNL without pain.    Status  New      PT LONG TERM GOAL #3   Title  Pt will report overall pain with driving and ususal activity to less than 2-3/10    Status  New             Plan - 08/09/19 1029    Clinical Impression Statement  Pt presents with cervicalgia and Rt shoulder pain. Pain appears to be more muscular in nature and does not have usual radicular symptoms. She has overall decreased  neck/throracic ROM, tightness and trigger points in her Rt upper trap and bilat cervical-thoracic paraspinals. She will benefit from skilled PT to address her deficits.    Personal Factors and Comorbidities  Comorbidity 2    Comorbidities  PMH: Rt shoulder scope and RTC repair 2019, anx,obesity, OA    Examination-Activity Limitations  Carry;Lift    Examination-Participation Restrictions  Driving;Laundry    Stability/Clinical Decision Making  Evolving/Moderate complexity    Clinical Decision Making  Moderate    Rehab Potential  Good    PT Frequency  2x / week    PT Duration  6 weeks    PT Treatment/Interventions  ADLs/Self Care Home Management;Electrical Stimulation;Cryotherapy;Iontophoresis 4mg /ml Dexamethasone;Moist Heat;Traction;Ultrasound;Neuromuscular re-education;Therapeutic activities;Manual techniques;Dry needling;Passive range of motion;Taping;Joint Manipulations;Spinal Manipulations    PT Next Visit Plan  review and update HEP PRN, DN or modalaties PRN, manual PRN    PT Home Exercise Plan  Access Code: [email or web address]    Consulted and Agree with Plan of Care  Patient       Patient will benefit from skilled therapeutic intervention in order to improve the following deficits and impairments:  Decreased activity tolerance, Decreased endurance, Decreased range of motion, Decreased strength, Impaired flexibility, Increased muscle spasms, Increased fascial restricitons, Pain, Postural dysfunction  Visit Diagnosis: Cervicalgia  Pain in thoracic spine  Chronic right shoulder pain     Problem List Patient Active Problem List   Diagnosis Date Noted  . Generalized anxiety disorder 04/08/2018  . Atypical chest pain   . Regurgitation of food   . Laryngopharyngeal reflux (LPR) 07/30/2017  . Throat soreness 07/30/2017  . ANA positive 12/30/2016  . Gastroesophageal reflux disease 06/27/2016  . Disturbance of skin sensation 04/27/2014  . Dizziness and giddiness 03/14/2014  . Headache,  migraine 03/14/2014  . Myalgia and myositis, unspecified 06/07/2013  . Nausea and vomiting in adult 08/22/2012  . Viral syndrome 08/22/2012  . Pelvic pain in female 12/18/2010  . PID (acute pelvic inflammatory disease) 12/18/2010  . ANEMIA-IRON DEFICIENCY 10/15/2006  . HERPES, GENITAL NOS 09/30/2006  . OBESITY NOS 09/30/2006  . DISORDER, NONORGANIC SLEEP NOS 09/30/2006  . CONSTIPATION NOS 09/30/2006  . NEURITIS, LUMBOSACRAL NOS 09/30/2006  . HX, PERSONAL, MENTAL DISORDER NOS 09/30/2006    April Manson, PT,DPT 08/09/2019, 10:37 AM  [facility] Physical Therapy [address] [place], [place], [address] Phone: [phone]   Fax:  [phone]  Name: [name] MRN: [id number] Date of Birth: 1975/12/20

## 2019-08-10 ENCOUNTER — Emergency Department (HOSPITAL_COMMUNITY): Payer: 59

## 2019-08-10 ENCOUNTER — Emergency Department (HOSPITAL_COMMUNITY)
Admission: EM | Admit: 2019-08-10 | Discharge: 2019-08-10 | Disposition: A | Discharge status: Home / Self Care | Payer: 59 | Attending: Emergency Medicine | Admitting: Emergency Medicine

## 2019-08-10 DIAGNOSIS — R0602 Shortness of breath: Secondary | ICD-10-CM

## 2019-08-10 DIAGNOSIS — M6790 Unspecified disorder of synovium and tendon, unspecified site: Secondary | ICD-10-CM

## 2019-08-10 DIAGNOSIS — M67959 Unspecified disorder of synovium and tendon, unspecified thigh: Secondary | ICD-10-CM

## 2019-08-10 DIAGNOSIS — R0789 Other chest pain: Secondary | ICD-10-CM

## 2019-08-10 LAB — HEPATIC FUNCTION PANEL
ALT: 20 U/L (ref 0–44)
AST: 24 U/L (ref 15–41)
Albumin: 4.4 g/dL (ref 3.5–5.0)
Alkaline Phosphatase: 67 U/L (ref 38–126)
Bilirubin, Direct: <0.1 mg/dL (ref 0.0–0.2)
Total Bilirubin: 0.7 mg/dL (ref 0.3–1.2)
Total Protein: 8.2 g/dL — ABNORMAL HIGH (ref 6.5–8.1)

## 2019-08-10 LAB — D-DIMER, QUANTITATIVE: D-Dimer, Quant: <0.27 ug/mL-FEU (ref 0.00–0.50)

## 2019-08-10 LAB — CBC
HCT: 37.2 % (ref 36.0–46.0)
Hemoglobin: 11.5 g/dL — ABNORMAL LOW (ref 12.0–15.0)
MCH: 24.3 pg — ABNORMAL LOW (ref 26.0–34.0)
MCHC: 30.9 g/dL (ref 30.0–36.0)
MCV: 78.6 fL — ABNORMAL LOW (ref 80.0–100.0)
Platelets: 296 10*3/uL (ref 150–400)
RBC: 4.73 MIL/uL (ref 3.87–5.11)
RDW: 13.6 % (ref 11.5–15.5)
WBC: 7.3 10*3/uL (ref 4.0–10.5)
nRBC: 0 % (ref 0.0–0.2)

## 2019-08-10 LAB — BASIC METABOLIC PANEL
Anion gap: 10 (ref 5–15)
BUN: 13 mg/dL (ref 6–20)
CO2: 26 mmol/L (ref 22–32)
Calcium: 9.4 mg/dL (ref 8.9–10.3)
Chloride: 102 mmol/L (ref 98–111)
Creatinine, Ser: 0.88 mg/dL (ref 0.44–1.00)
GFR calc Af Amer: >60 mL/min (ref >60)
GFR calc non Af Amer: >60 mL/min (ref >60)
Glucose, Bld: 112 mg/dL — ABNORMAL HIGH (ref 70–99)
Potassium: 4.1 mmol/L (ref 3.5–5.1)
Sodium: 138 mmol/L (ref 135–145)

## 2019-08-10 LAB — I-STAT BETA HCG BLOOD, ED (MC, WL, AP ONLY): I-stat hCG, quantitative: <5 m[IU]/mL (ref <5)

## 2019-08-10 LAB — TROPONIN I (HIGH SENSITIVITY)
Troponin I (High Sensitivity): <2 ng/L (ref <18)
Troponin I (High Sensitivity): <2 ng/L (ref <18)

## 2019-08-10 MED ORDER — KETOROLAC TROMETHAMINE 15 MG/ML IJ SOLN
15.0000 mg | Freq: Once | INTRAMUSCULAR | Status: AC
  Administered 2019-08-10: 03:00:00 15 mg via INTRAVENOUS
  Filled 2019-08-10: qty 1

## 2019-08-10 MED ORDER — ACETAMINOPHEN 500 MG PO TABS
1000.0000 mg | ORAL_TABLET | Freq: Once | ORAL | Status: AC
  Administered 2019-08-10: 1000 mg via ORAL
  Filled 2019-08-10: qty 2

## 2019-08-10 NOTE — ED Provider Notes (Signed)
[facility]-EMERGENCY DEPT Provider Note  CSN: [id number] Arrival date & time: 08/09/19 2302  Chief Complaint(s) Chest Pain and Leg Pain  HPI [name] is a 44 y.o. female with a past medical history listed below who presents to the emergency department with 1 week of right hip pain worse with movements and range of motion.  Pain is gradually worsened since onset.  It feels like a deep ache.  Alleviated by mobility.  Patient denies any trauma.  No lower extremity swelling.  Patient reports that the pain has become severe.  Reports that the pain tonight was excruciating.  She began to have lightheadedness after walking from room to room and began to have chest pain and shortness of breath.  Currently the symptoms have subsided.  She denies any fevers or chills.  No coughing or congestion.  No nausea or vomiting.  No other physical complaints.  HPI  Past Medical History Past Medical History:  Diagnosis Date  . Allergy   . Anemia   . Angio-edema   . Anxiety   . Arthritis   . Constipation   . GERD (gastroesophageal reflux disease)   . Hx of endometriosis   . Insomnia   . Macromastia 07/2011  . Migraine   . Urticaria    Patient Active Problem List   Diagnosis Date Noted  . Generalized anxiety disorder 04/08/2018  . Atypical chest pain   . Regurgitation of food   . Laryngopharyngeal reflux (LPR) 07/30/2017  . Throat soreness 07/30/2017  . ANA positive 12/30/2016  . Gastroesophageal reflux disease 06/27/2016  . Disturbance of skin sensation 04/27/2014  . Dizziness and giddiness 03/14/2014  . Headache, migraine 03/14/2014  . Myalgia and myositis, unspecified 06/07/2013  . Nausea and vomiting in adult 08/22/2012  . Viral syndrome 08/22/2012  . Pelvic pain in female 12/18/2010  . PID (acute pelvic inflammatory disease) 12/18/2010  . ANEMIA-IRON DEFICIENCY 10/15/2006  . HERPES, GENITAL NOS 09/30/2006  . OBESITY NOS 09/30/2006  . DISORDER, NONORGANIC  SLEEP NOS 09/30/2006  . CONSTIPATION NOS 09/30/2006  . NEURITIS, LUMBOSACRAL NOS 09/30/2006  . HX, PERSONAL, MENTAL DISORDER NOS 09/30/2006   Home Medication(s) Prior to Admission medications   Medication Sig Start Date End Date Taking? Authorizing Provider  acetaminophen (TYLENOL) 325 MG tablet Take 650 mg by mouth every 6 (six) hours as needed for mild pain or headache.   Yes [provider]  albuterol (VENTOLIN HFA) 108 (90 Base) MCG/ACT inhaler Inhale 2 puffs into the lungs every 6 (six) hours as needed for wheezing or shortness of breath. 07/22/19  Yes [name], FNP  esomeprazole (NEXIUM) 40 MG capsule TAKE 1 CAPSULE BY MOUTH EVERY MORNING Patient taking differently: Take 40 mg by mouth daily.  02/05/17  Yes Bing Neighbors, FNP  ferrous sulfate 325 (65 FE) MG EC tablet Take 1 tablet (325 mg total) by mouth 2 (two) times daily with a meal. 02/05/17  Yes Armbruster, Willaim Rayas, MD  Fluticasone-Salmeterol (ADVAIR DISKUS) 250-50 MCG/DOSE AEPB Inhale 1 puff into the lungs in the morning and at bedtime. 07/23/19  Yes Charlott Holler, MD  ibuprofen (ADVIL,MOTRIN) 200 MG tablet Take 200-400 mg by mouth every 6 (six) hours as needed for headache or mild pain.   Yes [provider]  loratadine (CLARITIN) 10 MG tablet Take 1 tablet (10 mg total) by mouth daily. 12/24/17  Yes Kallie Locks, FNP  polyethylene glycol [facility] Ethelene Hal) packet Take 17 g by mouth daily as needed  for mild constipation.    Yes [provider]  tiZANidine (ZANAFLEX) 2 MG tablet Take 1 tablet (2 mg total) by mouth 2 (two) times daily as needed for muscle spasms. 08/03/19  Yes Aundra Dubin, PA-C  Vitamin D, Ergocalciferol, (DRISDOL) 1.25 MG (50000 UNIT) CAPS capsule Take 1 capsule (50,000 Units total) by mouth 2 (two) times a week. 07/22/19  Yes [name], FNP  cetirizine (ZYRTEC) 10 MG tablet Take 1 tablet (10 mg total) by mouth daily. Patient not taking: Reported on 08/10/2019 01/09/18    Kennith Gain, MD  predniSONE (STERAPRED UNI-PAK 21 TAB) 5 MG (21) TBPK tablet Take as directed Patient not taking: Reported on 08/10/2019 08/03/19   Nathaniel Man                                                                                                                                    Past Surgical History Past Surgical History:  Procedure Laterality Date  . [place] STUDY N/A 01/12/2018   Procedure: [place] STUDY;  Surgeon: Mauri Pole, MD;  Location: [facility];  Service: Endoscopy;  Laterality: N/A;  . ABDOMINAL HYSTERECTOMY  12/12/2003  . ADENOIDECTOMY  05/2011  . ANKLE ARTHROSCOPY  12/29/2007   right; with extensive debridement  . BLADDER NECK RECONSTRUCTION  01/14/2011   Procedure: BLADDER NECK REPAIR;  Surgeon: Donnamae Jude, MD;  Location: [facility];  Service: Gynecology;  Laterality: N/A;  Laparoscopic Repair of Incidental Cystotomy  . BREAST REDUCTION SURGERY  08/05/2011   Procedure: MAMMARY REDUCTION  (BREAST);  Surgeon: Cristine Polio, MD;  Location: [facility];  Service: Plastics;  Laterality: Bilateral;  bilateral  . CESAREAN SECTION  12/29/2000; 1994  . DILATION AND CURETTAGE OF UTERUS  07/08/2003   open laparoscopy  . ESOPHAGEAL MANOMETRY N/A 01/12/2018   Procedure: ESOPHAGEAL MANOMETRY (EM);  Surgeon: Mauri Pole, MD;  [facility]: [facility];  Service: Endoscopy;  Laterality: N/A;  . GIVENS CAPSULE STUDY N/A 06/23/2017   Procedure: GIVENS CAPSULE STUDY;  Surgeon: Yetta Flock, MD;  Location: [facility];  Service: Gastroenterology;  Laterality: N/A;  . LAPAROSCOPIC SALPINGOOPHERECTOMY  01/14/2011   left  . [place] IMPEDANCE STUDY N/A 01/12/2018   Procedure: [place] IMPEDANCE STUDY;  Surgeon: Mauri Pole, MD;  [facility]: [facility];  Service: Endoscopy;  Laterality: N/A;  . REPAIR PERONEAL TENDONS ANKLE  01/03/2010   repair right subluxing peroneal tendons  . RESECTION DISTAL CLAVICAL Right 09/25/2017   Procedure:  RESECTION DISTAL CLAVICAL;  Surgeon: Hiram Gash, MD;  Location: [facility];  Service: Orthopedics;  Laterality: Right;  . RIGHT OOPHORECTOMY     with lysis of adhesions  . SHOULDER ACROMIOPLASTY Right 09/25/2017   Procedure: SHOULDER ACROMIOPLASTY;  Surgeon: Hiram Gash, MD;  Location: [facility];  Service: Orthopedics;  Laterality: Right;  . SHOULDER ARTHROSCOPY WITH DEBRIDEMENT AND BICEP  TENDON REPAIR Right 09/25/2017   Procedure: SHOULDER ARTHROSCOPY WITH DEBRIDEMENT AND BICEP TENDON REPAIR;  Surgeon: Bjorn PippinVarkey, Dax T, MD;  Location: [facility];  Service: Orthopedics;  Laterality: Right;  . SHOULDER ARTHROSCOPY WITH ROTATOR CUFF REPAIR Right 09/25/2017   Procedure: SHOULDER ARTHROSCOPY WITH ROTATOR CUFF REPAIR;  Surgeon: Bjorn PippinVarkey, Dax T, MD;  Location: [facility];  Service: Orthopedics;  Laterality: Right;  . TONSILLECTOMY  as a child  . TUBAL LIGATION  12/29/2000   Family History Family History  Problem Relation Age of Onset  . Diabetes Mother   . Hypertension Mother   . Thyroid disease Sister   . Heart attack Maternal Grandmother   . Stroke Maternal Grandfather   . Asthma Son   . Asthma Daughter   . Colon cancer Neg Hx   . Colon polyps Neg Hx   . Esophageal cancer Neg Hx   . Rectal cancer Neg Hx   . Stomach cancer Neg Hx     Social History Social History   Tobacco Use  . Smoking status: Never Smoker  . Smokeless tobacco: Never Used  Substance Use Topics  . Alcohol use: Yes    Comment: occasional   . Drug use: No   Allergies Patient has no known allergies.  Review of Systems Review of Systems All other systems are reviewed and are negative for acute change except as noted in the HPI  Physical Exam Vital Signs  I have reviewed the triage vital signs BP (!) 163/94 (BP Location: Left Arm)   Pulse 78   Temp 97.9 F (36.6 C) (Oral)   Resp (!) 24   Ht 5\' 4"  (1.626 m)   Wt 104.3 kg   LMP 12/27/2003   SpO2  98%   BMI 39.48 kg/m   Physical Exam Vitals reviewed.  Constitutional:      General: She is not in acute distress.    Appearance: She is well-developed. She is not diaphoretic.  HENT:     Head: Normocephalic and atraumatic.     Nose: Nose normal.  Eyes:     General: No scleral icterus.       Right eye: No discharge.        Left eye: No discharge.     Conjunctiva/sclera: Conjunctivae normal.     Pupils: Pupils are equal, round, and reactive to light.  Cardiovascular:     Rate and Rhythm: Normal rate and regular rhythm.     Heart sounds: No murmur. No friction rub. No gallop.   Pulmonary:     Effort: Pulmonary effort is normal. No respiratory distress.     Breath sounds: Normal breath sounds. No stridor. No rales.  Abdominal:     General: There is no distension.     Palpations: Abdomen is soft.     Tenderness: There is no abdominal tenderness.  Musculoskeletal:     Cervical back: Normal range of motion and neck supple.     Right hip: Tenderness present. Normal range of motion. Normal strength.     Right lower leg: No edema.     Left lower leg: No edema.       Legs:  Skin:    General: Skin is warm and dry.     Findings: No erythema or rash.  Neurological:     Mental Status: She is alert and oriented to person, place, and time.     ED Results and Treatments Labs (all labs ordered are listed, but only abnormal results  are displayed) Labs Reviewed  BASIC METABOLIC PANEL - Abnormal; Notable for the following components:      Result Value   Glucose, Bld 112 (*)    All other components within normal limits  CBC - Abnormal; Notable for the following components:   Hemoglobin 11.5 (*)    MCV 78.6 (*)    MCH 24.3 (*)    All other components within normal limits  HEPATIC FUNCTION PANEL - Abnormal; Notable for the following components:   Total Protein 8.2 (*)    All other components within normal limits  D-DIMER, QUANTITATIVE (NOT AT [facility])  I-STAT BETA HCG BLOOD, ED ([facility],  [facility], [facility] ONLY)  TROPONIN I (HIGH SENSITIVITY)  TROPONIN I (HIGH SENSITIVITY)                                                                                                                         EKG  EKG Interpretation  Date/Time:  Monday August 09 2019 23:30:09 EDT Ventricular Rate:  94 PR Interval:    QRS Duration: 94 QT Interval:  344 QTC Calculation: 431 R Axis:   84 Text Interpretation: Sinus rhythm RSR' in V1 or V2, probably normal variant 12 Lead; [place] No acute changes Confirmed by Drema Pry [phone]) on 08/10/2019 12:45:49 AM      Radiology DG Chest 2 View  Result Date: 08/09/2019 CLINICAL DATA:  Right leg pain.  Left upper quadrant chest pain. EXAM: CHEST - 2 VIEW COMPARISON:  June 05, 2019 FINDINGS: The heart size and mediastinal contours are within normal limits. Both lungs are clear. The visualized skeletal structures are unremarkable. IMPRESSION: No active cardiopulmonary disease. Electronically Signed   By: Katherine Mantle M.D.   On: 08/09/2019 23:25   DG HIP UNILAT W OR W/O PELVIS 2-3 VIEWS RIGHT  Result Date: 08/10/2019 CLINICAL DATA:  Initial evaluation for acute right hip pain. No injury. EXAM: DG HIP (WITH OR WITHOUT PELVIS) 2-3V RIGHT COMPARISON:  None. FINDINGS: No acute fracture dislocation. Femoral head height maintained. Subtle calcification at the right greater trochanter could reflect underlying calcific tendinopathy. Joint spaces maintained without evidence for significant degenerative arthropathy. No erosive changes. Bony pelvis intact. SI joints approximated symmetric. Limited views of the left hip unremarkable. Osseous mineralization normal. No visible soft tissue injury. IMPRESSION: 1. No acute osseous abnormality about the right hip. 2. Subtle calcification at the right greater trochanter, which could reflect underlying calcific tendinopathy. Electronically Signed   By: Rise Mu M.D.   On: 08/10/2019 02:12    Pertinent labs & imaging  results that were available during my care of the patient were reviewed by me and considered in my medical decision making (see chart for details).  Medications Ordered in ED Medications  sodium chloride flush (NS) 0.9 % injection 3 mL (0 mLs Intravenous Hold 08/10/19 0121)  ketorolac (TORADOL) 15 MG/ML injection 15 mg (15 mg Intravenous Given 08/10/19 0315)  acetaminophen (TYLENOL) tablet 1,000 mg (1,000 mg Oral Given 08/10/19 0541)  Procedures Procedures  (including critical care time)  Medical Decision Making / ED Course I have reviewed the nursing notes for this encounter and the patient's prior records (if available in EHR or on provided paperwork).   Chudney S Dilley was evaluated in Emergency Department on 08/10/2019 for the symptoms described in the history of present illness. She was evaluated in the context of the global COVID-19 pandemic, which necessitated consideration that the patient might be at risk for infection with the SARS-CoV-2 virus that causes COVID-19. Institutional protocols and algorithms that pertain to the evaluation of patients at risk for COVID-19 are in a state of rapid change based on information released by regulatory bodies including the CDC and federal and state organizations. These policies and algorithms were followed during the patient's care in the ED.  Patient presents with 1 week of right hip pain.  No trauma.  No lower extremity edema or swelling.  Plain film notable for possible calcific tendinitis.  D-dimer negative.  Doubt DVT.  Chest pain and shortness of breath possibly related to anxiety.  Doubt PE.  EKG without acute ischemic changes or evidence of pericarditis.  Initial troponin negative.  Heart score of 1 and appropriate for delta troponin. Delta trop negative.  Presentation not classic for either dissection or esophageal  perforation.  Chest x-ray without evidence suggestive of pneumonia, pneumothorax, pneumomediastinum.  No abnormal contour of the mediastinum to suggest dissection. No evidence of acute injuries.  Patient ambulated without the satting or increased work of breathing.     Final Clinical Impression(s) / ED Diagnoses Final diagnoses:  Tendinopathy involving hip  SOB (shortness of breath)  Atypical chest pain    The patient appears reasonably screened and/or stabilized for discharge and I doubt any other medical condition or other [place] requiring further screening, evaluation, or treatment in the ED at this time prior to discharge. Safe for discharge with strict return precautions.  Disposition: Discharge  Condition: Good  I have discussed the results, Dx and Tx plan with the patient/family who expressed understanding and agree(s) with the plan. Discharge instructions discussed at length. The patient/family was given strict return precautions who verbalized understanding of the instructions. No further questions at time of discharge.    ED Discharge Orders    None        Follow Up: [name], FNP [address] [place] [address] [phone]  Schedule an appointment as soon as possible for a visit       This chart was dictated using voice recognition software.  Despite best efforts to proofread,  errors can occur which can change the documentation meaning.   Nira Conn, MD 08/10/19 [phone]

## 2019-08-10 NOTE — ED Notes (Signed)
Pt ambulated with Pulse Ox. O2 stayed steady at 98.

## 2019-08-11 ENCOUNTER — Ambulatory Visit (INDEPENDENT_AMBULATORY_CARE_PROVIDER_SITE_OTHER): Payer: 59 | Admitting: Nurse Practitioner

## 2019-08-11 ENCOUNTER — Encounter: Payer: Self-pay | Admitting: Nurse Practitioner

## 2019-08-11 ENCOUNTER — Emergency Department (HOSPITAL_COMMUNITY)
Admission: EM | Admit: 2019-08-11 | Discharge: 2019-08-12 | Disposition: A | Discharge status: Home / Self Care | Payer: 59 | Attending: Emergency Medicine | Admitting: Emergency Medicine

## 2019-08-11 ENCOUNTER — Encounter (HOSPITAL_COMMUNITY): Payer: Self-pay | Admitting: Emergency Medicine

## 2019-08-11 ENCOUNTER — Other Ambulatory Visit: Payer: Self-pay

## 2019-08-11 VITALS — BP 130/84 | HR 99 | Temp 97.7°F | Ht 64.0 in | Wt 238.0 lb

## 2019-08-11 DIAGNOSIS — R110 Nausea: Secondary | ICD-10-CM | POA: Insufficient documentation

## 2019-08-11 DIAGNOSIS — R42 Dizziness and giddiness: Secondary | ICD-10-CM | POA: Diagnosis not present

## 2019-08-11 DIAGNOSIS — R0602 Shortness of breath: Secondary | ICD-10-CM | POA: Diagnosis not present

## 2019-08-11 DIAGNOSIS — R519 Headache, unspecified: Secondary | ICD-10-CM | POA: Insufficient documentation

## 2019-08-11 DIAGNOSIS — G4452 New daily persistent headache (NDPH): Secondary | ICD-10-CM | POA: Diagnosis not present

## 2019-08-11 DIAGNOSIS — Z5321 Procedure and treatment not carried out due to patient leaving prior to being seen by health care provider: Secondary | ICD-10-CM | POA: Diagnosis not present

## 2019-08-11 LAB — URINALYSIS, ROUTINE W REFLEX MICROSCOPIC
Bilirubin Urine: NEGATIVE
Glucose, UA: NEGATIVE mg/dL
Hgb urine dipstick: NEGATIVE
Ketones, ur: NEGATIVE mg/dL
Nitrite: NEGATIVE
Protein, ur: NEGATIVE mg/dL
Specific Gravity, Urine: 1.004 — ABNORMAL LOW (ref 1.005–1.030)
pH: 7 (ref 5.0–8.0)

## 2019-08-11 LAB — BASIC METABOLIC PANEL
Anion gap: 13 (ref 5–15)
BUN: 12 mg/dL (ref 6–20)
CO2: 25 mmol/L (ref 22–32)
Calcium: 9.4 mg/dL (ref 8.9–10.3)
Chloride: 101 mmol/L (ref 98–111)
Creatinine, Ser: 0.84 mg/dL (ref 0.44–1.00)
GFR calc Af Amer: >60 mL/min (ref >60)
GFR calc non Af Amer: >60 mL/min (ref >60)
Glucose, Bld: 89 mg/dL (ref 70–99)
Potassium: 3.6 mmol/L (ref 3.5–5.1)
Sodium: 139 mmol/L (ref 135–145)

## 2019-08-11 LAB — CBC
HCT: 38.6 % (ref 36.0–46.0)
Hemoglobin: 11.7 g/dL — ABNORMAL LOW (ref 12.0–15.0)
MCH: 24.2 pg — ABNORMAL LOW (ref 26.0–34.0)
MCHC: 30.3 g/dL (ref 30.0–36.0)
MCV: 79.9 fL — ABNORMAL LOW (ref 80.0–100.0)
Platelets: 358 10*3/uL (ref 150–400)
RBC: 4.83 MIL/uL (ref 3.87–5.11)
RDW: 13.6 % (ref 11.5–15.5)
WBC: 5.9 10*3/uL (ref 4.0–10.5)
nRBC: 0 % (ref 0.0–0.2)

## 2019-08-11 LAB — I-STAT BETA HCG BLOOD, ED (MC, WL, AP ONLY): I-stat hCG, quantitative: <5 m[IU]/mL (ref <5)

## 2019-08-11 MED ORDER — ACETAMINOPHEN 325 MG PO TABS
650.0000 mg | ORAL_TABLET | Freq: Once | ORAL | Status: AC | PRN
  Administered 2019-08-11: 650 mg via ORAL
  Filled 2019-08-11: qty 2

## 2019-08-11 NOTE — ED Triage Notes (Signed)
Pt endorses severe HA for a few days. Pt now complaining of dizziness, SOB and nausea.

## 2019-08-11 NOTE — ED Notes (Signed)
Pt called for vitals x3. 

## 2019-08-11 NOTE — Progress Notes (Signed)
This visit occurred during the SARS-CoV-2 public health emergency.  Safety protocols were in place, including screening questions prior to the visit, additional usage of staff PPE, and extensive cleaning of exam room while observing appropriate contact time as indicated for disinfecting solutions.  Subjective:     Patient ID: [name] , female    DOB: [birth year] , 44 y.o.   MRN: [id number]   Chief Complaint  Patient presents with  . Shortness of Breath    HEADACHE AND STOMACH PAIN    HPI  Here for follow up ER visit - headaches waking her at night Does not have a history of asthma taking advair and albuterol inhaler.  She reports her asthma provider changed her from nexium to omeprazole. Continues to complain of GERD History of anemia.   Had to sleep sitting up due to her headache, took tylenol was ineffective. Initially began when she had the dizziness.  Headache  This is a chronic problem. The current episode started in the past 7 days. The problem has been unchanged. The quality of the pain is described as aching. The patient is experiencing no pain. Pertinent negatives include no abdominal pain, blurred vision, coughing, dizziness, nausea, numbness, phonophobia or photophobia. She has tried acetaminophen (she has taken ibuprofen and aleve - two times a day with tylenol. ) for the symptoms. The treatment provided no relief. There is no history of cancer or cluster headaches.  Gastroesophageal Reflux She complains of a hoarse voice. She reports no abdominal pain, no belching, no chest pain, no coughing, no dysphagia, no heartburn, no nausea or no wheezing. This is a chronic problem. Pertinent negatives include no fatigue.     Past Medical History:  Diagnosis Date  . Allergy   . Anemia   . Angio-edema   . Anxiety   . Arthritis   . Constipation   . GERD (gastroesophageal reflux disease)   . Hx of endometriosis   . Insomnia   . Macromastia 07/2011  . Migraine   .  Urticaria      Family History  Problem Relation Age of Onset  . Diabetes Mother   . Hypertension Mother   . Thyroid disease Sister   . Heart attack Maternal Grandmother   . Stroke Maternal Grandfather   . Asthma Son   . Asthma Daughter   . Colon cancer Neg Hx   . Colon polyps Neg Hx   . Esophageal cancer Neg Hx   . Rectal cancer Neg Hx   . Stomach cancer Neg Hx      Current Outpatient Medications:  .  acetaminophen (TYLENOL) 325 MG tablet, Take 650 mg by mouth every 6 (six) hours as needed for mild pain or headache., Disp: , Rfl:  .  albuterol (VENTOLIN HFA) 108 (90 Base) MCG/ACT inhaler, Inhale 2 puffs into the lungs every 6 (six) hours as needed for wheezing or shortness of breath., Disp: 18 g, Rfl: 0 .  cetirizine (ZYRTEC) 10 MG tablet, Take 1 tablet (10 mg total) by mouth daily. (Patient not taking: Reported on 08/10/2019), Disp: 30 tablet, Rfl: 5 .  esomeprazole (NEXIUM) 40 MG capsule, TAKE 1 CAPSULE BY MOUTH EVERY MORNING (Patient taking differently: Take 40 mg by mouth daily. ), Disp: 90 capsule, Rfl: 1 .  ferrous sulfate 325 (65 FE) MG EC tablet, Take 1 tablet (325 mg total) by mouth 2 (two) times daily with a meal., Disp: 60 tablet, Rfl: 3 .  Fluticasone-Salmeterol (ADVAIR DISKUS) 250-50 MCG/DOSE AEPB, Inhale 1  puff into the lungs in the morning and at bedtime., Disp: 60 each, Rfl: 5 .  ibuprofen (ADVIL,MOTRIN) 200 MG tablet, Take 200-400 mg by mouth every 6 (six) hours as needed for headache or mild pain., Disp: , Rfl:  .  loratadine (CLARITIN) 10 MG tablet, Take 1 tablet (10 mg total) by mouth daily., Disp: 30 tablet, Rfl: 11 .  polyethylene glycol (MIRALAX / GLYCOLAX) packet, Take 17 g by mouth daily as needed for mild constipation. , Disp: , Rfl:  .  predniSONE (STERAPRED UNI-PAK 21 TAB) 5 MG (21) TBPK tablet, Take as directed (Patient not taking: Reported on 08/10/2019), Disp: 21 tablet, Rfl: 0 .  tiZANidine (ZANAFLEX) 2 MG tablet, Take 1 tablet (2 mg total) by mouth 2 (two)  times daily as needed for muscle spasms., Disp: 20 tablet, Rfl: 0 .  Vitamin D, Ergocalciferol, (DRISDOL) 1.25 MG (50000 UNIT) CAPS capsule, Take 1 capsule (50,000 Units total) by mouth 2 (two) times a week., Disp: 24 capsule, Rfl: 1   No Known Allergies   Review of Systems  Constitutional: Negative for fatigue.  HENT: Positive for hoarse voice.   Eyes: Negative for blurred vision and photophobia.  Respiratory: Positive for shortness of breath (being treated by pulmonology - intermittent). Negative for cough and wheezing.   Cardiovascular: Negative.  Negative for chest pain, palpitations and leg swelling.  Gastrointestinal: Negative for abdominal pain, dysphagia, heartburn and nausea.  Neurological: Positive for headaches. Negative for dizziness and numbness.  Psychiatric/Behavioral: Negative.      Today's Vitals   08/11/19 0959  BP: 130/84  Pulse: 99  Temp: 97.7 F (36.5 C)  TempSrc: Oral  Weight: 238 lb (108 kg)  Height: 5\' 4"  (1.626 m)   Body mass index is 40.85 kg/m.   Objective:  Physical Exam Constitutional:      General: She is not in acute distress.    Appearance: She is well-developed. She is obese.  Pulmonary:     Breath sounds: No decreased breath sounds, wheezing, rhonchi or rales.  Skin:    General: Skin is warm and dry.     Capillary Refill: Capillary refill takes less than 2 seconds.  Neurological:     General: No focal deficit present.     Mental Status: She is alert.     Cranial Nerves: No cranial nerve deficit.  Psychiatric:        Mood and Affect: Mood normal.        Behavior: Behavior normal.         Assessment And Plan:     1. New daily persistent headache  Sample of ubrelvy given she reports a history of migraines in the past  She has received toradol yesterday but did not help her headache.    When preparing to send her a Rx of prednisone she has just recently had a Rx for a taper 08/03/2019.  She continues with her follow up with  Pulmonology and she has seen the ENT who gave her pantoprazole.       08/05/2019, FNP    THE PATIENT IS ENCOURAGED TO PRACTICE SOCIAL DISTANCING DUE TO THE COVID-19 PANDEMIC.

## 2019-08-12 ENCOUNTER — Ambulatory Visit: Payer: 59 | Admitting: Nurse Practitioner

## 2019-08-12 ENCOUNTER — Encounter (HOSPITAL_COMMUNITY): Payer: Self-pay | Admitting: Emergency Medicine

## 2019-08-12 ENCOUNTER — Emergency Department (HOSPITAL_COMMUNITY)
Admission: EM | Admit: 2019-08-12 | Discharge: 2019-08-12 | Discharge status: Against Medical Advice | Payer: 59 | Source: Home / Self Care | Attending: Emergency Medicine | Admitting: Emergency Medicine

## 2019-08-12 ENCOUNTER — Other Ambulatory Visit: Payer: Self-pay

## 2019-08-12 LAB — URINALYSIS, ROUTINE W REFLEX MICROSCOPIC
Bilirubin Urine: NEGATIVE
Glucose, UA: NEGATIVE mg/dL
Hgb urine dipstick: NEGATIVE
Ketones, ur: NEGATIVE mg/dL
Leukocytes,Ua: NEGATIVE
Nitrite: NEGATIVE
Protein, ur: NEGATIVE mg/dL
Specific Gravity, Urine: 1.003 — ABNORMAL LOW (ref 1.005–1.030)
pH: 6 (ref 5.0–8.0)

## 2019-08-12 LAB — CBC
HCT: 37.8 % (ref 36.0–46.0)
Hemoglobin: 11.1 g/dL — ABNORMAL LOW (ref 12.0–15.0)
MCH: 24.2 pg — ABNORMAL LOW (ref 26.0–34.0)
MCHC: 29.4 g/dL — ABNORMAL LOW (ref 30.0–36.0)
MCV: 82.5 fL (ref 80.0–100.0)
Platelets: 331 10*3/uL (ref 150–400)
RBC: 4.58 MIL/uL (ref 3.87–5.11)
RDW: 13.4 % (ref 11.5–15.5)
WBC: 5.7 10*3/uL (ref 4.0–10.5)
nRBC: 0 % (ref 0.0–0.2)

## 2019-08-12 LAB — COMPREHENSIVE METABOLIC PANEL
ALT: 17 U/L (ref 0–44)
AST: 21 U/L (ref 15–41)
Albumin: 4.2 g/dL (ref 3.5–5.0)
Alkaline Phosphatase: 60 U/L (ref 38–126)
Anion gap: 12 (ref 5–15)
BUN: 15 mg/dL (ref 6–20)
CO2: 24 mmol/L (ref 22–32)
Calcium: 8.9 mg/dL (ref 8.9–10.3)
Chloride: 103 mmol/L (ref 98–111)
Creatinine, Ser: 0.79 mg/dL (ref 0.44–1.00)
GFR calc Af Amer: >60 mL/min (ref >60)
GFR calc non Af Amer: >60 mL/min (ref >60)
Glucose, Bld: 89 mg/dL (ref 70–99)
Potassium: 3.6 mmol/L (ref 3.5–5.1)
Sodium: 139 mmol/L (ref 135–145)
Total Bilirubin: 0.5 mg/dL (ref 0.3–1.2)
Total Protein: 7.5 g/dL (ref 6.5–8.1)

## 2019-08-12 NOTE — ED Triage Notes (Signed)
Patient complaining of a headache, dizzy, and nauseas. Patient states it started yesterday.

## 2019-08-13 ENCOUNTER — Encounter: Payer: 59 | Admitting: Rehabilitative and Restorative Service Providers"

## 2019-08-15 ENCOUNTER — Emergency Department (HOSPITAL_COMMUNITY): Payer: 59

## 2019-08-15 ENCOUNTER — Other Ambulatory Visit: Payer: Self-pay

## 2019-08-15 ENCOUNTER — Emergency Department (HOSPITAL_COMMUNITY)
Admission: EM | Admit: 2019-08-15 | Discharge: 2019-08-15 | Disposition: A | Discharge status: Home / Self Care | Payer: 59 | Attending: Emergency Medicine | Admitting: Emergency Medicine

## 2019-08-15 DIAGNOSIS — R002 Palpitations: Secondary | ICD-10-CM | POA: Diagnosis present

## 2019-08-15 DIAGNOSIS — Z79899 Other long term (current) drug therapy: Secondary | ICD-10-CM | POA: Diagnosis not present

## 2019-08-15 DIAGNOSIS — Z20822 Contact with and (suspected) exposure to covid-19: Secondary | ICD-10-CM | POA: Diagnosis not present

## 2019-08-15 DIAGNOSIS — R531 Weakness: Secondary | ICD-10-CM | POA: Insufficient documentation

## 2019-08-15 DIAGNOSIS — R519 Headache, unspecified: Secondary | ICD-10-CM | POA: Insufficient documentation

## 2019-08-15 DIAGNOSIS — R42 Dizziness and giddiness: Secondary | ICD-10-CM | POA: Insufficient documentation

## 2019-08-15 DIAGNOSIS — R079 Chest pain, unspecified: Secondary | ICD-10-CM

## 2019-08-15 DIAGNOSIS — R0789 Other chest pain: Secondary | ICD-10-CM | POA: Diagnosis not present

## 2019-08-15 LAB — CBC
HCT: 35.6 % — ABNORMAL LOW (ref 36.0–46.0)
Hemoglobin: 10.9 g/dL — ABNORMAL LOW (ref 12.0–15.0)
MCH: 24.3 pg — ABNORMAL LOW (ref 26.0–34.0)
MCHC: 30.6 g/dL (ref 30.0–36.0)
MCV: 79.3 fL — ABNORMAL LOW (ref 80.0–100.0)
Platelets: 365 10*3/uL (ref 150–400)
RBC: 4.49 MIL/uL (ref 3.87–5.11)
RDW: 13.4 % (ref 11.5–15.5)
WBC: 6.6 10*3/uL (ref 4.0–10.5)
nRBC: 0 % (ref 0.0–0.2)

## 2019-08-15 LAB — I-STAT BETA HCG BLOOD, ED (MC, WL, AP ONLY): I-stat hCG, quantitative: <5 m[IU]/mL (ref <5)

## 2019-08-15 LAB — POC SARS CORONAVIRUS 2 AG -  ED: SARS Coronavirus 2 Ag: NEGATIVE

## 2019-08-15 LAB — D-DIMER, QUANTITATIVE: D-Dimer, Quant: 0.29 ug/mL-FEU (ref 0.00–0.50)

## 2019-08-15 LAB — BASIC METABOLIC PANEL
Anion gap: 10 (ref 5–15)
BUN: 8 mg/dL (ref 6–20)
CO2: 26 mmol/L (ref 22–32)
Calcium: 9.3 mg/dL (ref 8.9–10.3)
Chloride: 102 mmol/L (ref 98–111)
Creatinine, Ser: 0.94 mg/dL (ref 0.44–1.00)
GFR calc Af Amer: >60 mL/min (ref >60)
GFR calc non Af Amer: >60 mL/min (ref >60)
Glucose, Bld: 105 mg/dL — ABNORMAL HIGH (ref 70–99)
Potassium: 3.7 mmol/L (ref 3.5–5.1)
Sodium: 138 mmol/L (ref 135–145)

## 2019-08-15 LAB — HEPATIC FUNCTION PANEL
ALT: 19 U/L (ref 0–44)
AST: 22 U/L (ref 15–41)
Albumin: 3.9 g/dL (ref 3.5–5.0)
Alkaline Phosphatase: 68 U/L (ref 38–126)
Bilirubin, Direct: <0.1 mg/dL (ref 0.0–0.2)
Total Bilirubin: 0.4 mg/dL (ref 0.3–1.2)
Total Protein: 7.2 g/dL (ref 6.5–8.1)

## 2019-08-15 LAB — TROPONIN I (HIGH SENSITIVITY)
Troponin I (High Sensitivity): <2 ng/L (ref <18)
Troponin I (High Sensitivity): <2 ng/L (ref <18)

## 2019-08-15 LAB — LIPASE, BLOOD: Lipase: 32 U/L (ref 11–51)

## 2019-08-15 MED ORDER — SODIUM CHLORIDE 0.9% FLUSH: 3.0000 mL | Freq: Once | INTRAVENOUS | Status: DC

## 2019-08-15 MED ORDER — SUCRALFATE 1 G PO TABS: 1.0000 g | ORAL_TABLET | Freq: Three times a day (TID) | ORAL | 0 refills | Status: DC

## 2019-08-15 NOTE — ED Notes (Signed)
Patient verbalizes understanding of discharge instructions. Opportunity for questioning and answers were provided. pt discharged from ED ambulatory.   

## 2019-08-15 NOTE — ED Triage Notes (Signed)
Pt reports chest pain, palpitations and sob about 30 mins after eating a salad (approx 2 hours ago). Reports she thinks she may be having an allergic reaction, however, denies swelling to mouth or tongue. Speaking in complete sentences. resp e/u,nad.

## 2019-08-15 NOTE — ED Provider Notes (Signed)
[facility] EMERGENCY DEPARTMENT Provider Note   CSN: [id number] Arrival date & time: 08/15/19  0305     History Chief Complaint  Patient presents with  . Palpitations  . Chest Pain    [name] is a 44 y.o. female with a hx of anemia, GERD, anxiety presents to the Emergency Department complaining of gradual, waxing and waning symptoms of nausea, fatigue, lightheadedness, chest pain onset around 11:30 PM after eating a salad.  Patient reports she has had intermittent episodes like this all week.  She denies fever, chills, headache, neck pain, abdominal pain, vomiting, diarrhea, weakness, dizziness, syncope.  She denies all urinary and vaginal symptoms.  Patient reports she feels as if something is not right.  Earlier tonight she had associated palpitations but these have resolved upon my evaluation.  No specific aggravating or alleviating factors.  No treatments prior to arrival.  She denies a cardiac history.  She does report a history of cholecystectomy, abdominal hysterectomy.     The history is provided by the patient and medical records. No language interpreter was used.    HPI: A 44 year old patient with a history of obesity presents for evaluation of chest pain. Initial onset of pain was approximately 1-3 hours ago. The patient's chest pain is described as heaviness/pressure/tightness and is not worse with exertion. The patient's chest pain is middle- or left-sided, is not well-localized, is not sharp and does not radiate to the arms/jaw/neck. The patient does not complain of nausea and denies diaphoresis. The patient has no history of stroke, has no history of peripheral artery disease, has not smoked in the past 90 days, denies any history of treated diabetes, has no relevant family history of coronary artery disease (first degree relative at less than age 32), is not hypertensive and has no history of hypercholesterolemia.   Past Medical History:  Diagnosis  Date  . Allergy   . Anemia   . Angio-edema   . Anxiety   . Arthritis   . Constipation   . GERD (gastroesophageal reflux disease)   . Hx of endometriosis   . Insomnia   . Macromastia 07/2011  . Migraine   . Urticaria     Patient Active Problem List   Diagnosis Date Noted  . Generalized anxiety disorder 04/08/2018  . Atypical chest pain   . Regurgitation of food   . Laryngopharyngeal reflux (LPR) 07/30/2017  . Throat soreness 07/30/2017  . ANA positive 12/30/2016  . Gastroesophageal reflux disease 06/27/2016  . Disturbance of skin sensation 04/27/2014  . Dizziness and giddiness 03/14/2014  . Headache, migraine 03/14/2014  . Myalgia and myositis, unspecified 06/07/2013  . Nausea and vomiting in adult 08/22/2012  . Viral syndrome 08/22/2012  . Pelvic pain in female 12/18/2010  . PID (acute pelvic inflammatory disease) 12/18/2010  . ANEMIA-IRON DEFICIENCY 10/15/2006  . HERPES, GENITAL NOS 09/30/2006  . OBESITY NOS 09/30/2006  . DISORDER, NONORGANIC SLEEP NOS 09/30/2006  . CONSTIPATION NOS 09/30/2006  . NEURITIS, LUMBOSACRAL NOS 09/30/2006  . HX, PERSONAL, MENTAL DISORDER NOS 09/30/2006    Past Surgical History:  Procedure Laterality Date  . 24 HOUR PH STUDY N/A 01/12/2018   Procedure: 24 HOUR PH STUDY;  Surgeon: Napoleon Form, MD;  Location: [facility];  Service: Endoscopy;  Laterality: N/A;  . ABDOMINAL HYSTERECTOMY  12/12/2003  . ADENOIDECTOMY  05/2011  . ANKLE ARTHROSCOPY  12/29/2007   right; with extensive debridement  . BLADDER NECK RECONSTRUCTION  01/14/2011   Procedure: BLADDER NECK  REPAIR;  Surgeon: Reva Boresanya S Pratt, MD;  Location: [facility];  Service: Gynecology;  Laterality: N/A;  Laparoscopic Repair of Incidental Cystotomy  . BREAST REDUCTION SURGERY  08/05/2011   Procedure: MAMMARY REDUCTION  (BREAST);  Surgeon: Louisa SecondGerald Truesdale, MD;  Location: [facility];  Service: Plastics;  Laterality: Bilateral;  bilateral  . CESAREAN SECTION  12/29/2000; 1994  .  DILATION AND CURETTAGE OF UTERUS  07/08/2003   open laparoscopy  . ESOPHAGEAL MANOMETRY N/A 01/12/2018   Procedure: ESOPHAGEAL MANOMETRY (EM);  Surgeon: Napoleon FormNandigam, Kavitha V, MD;  Location: [facility];  Service: Endoscopy;  Laterality: N/A;  . GIVENS CAPSULE STUDY N/A 06/23/2017   Procedure: GIVENS CAPSULE STUDY;  Surgeon: Benancio DeedsArmbruster, Steven P, MD;  [facility];  Service: Gastroenterology;  Laterality: N/A;  . LAPAROSCOPIC SALPINGOOPHERECTOMY  01/14/2011   left  . PH IMPEDANCE STUDY N/A 01/12/2018   Procedure: PH IMPEDANCE STUDY;  Surgeon: Napoleon FormNandigam, Kavitha V, MD;  Location: [facility];  Service: Endoscopy;  Laterality: N/A;  . REPAIR PERONEAL TENDONS ANKLE  01/03/2010   repair right subluxing peroneal tendons  . RESECTION DISTAL CLAVICAL Right 09/25/2017   Procedure: RESECTION DISTAL CLAVICAL;  Surgeon: Bjorn PippinVarkey, Dax T, MD;  Location: [facility];  Service: Orthopedics;  Laterality: Right;  . RIGHT OOPHORECTOMY     with lysis of adhesions  . SHOULDER ACROMIOPLASTY Right 09/25/2017   Procedure: SHOULDER ACROMIOPLASTY;  Surgeon: Bjorn PippinVarkey, Dax T, MD;  Location: [facility];  Service: Orthopedics;  Laterality: Right;  . SHOULDER ARTHROSCOPY WITH DEBRIDEMENT AND BICEP TENDON REPAIR Right 09/25/2017   Procedure: SHOULDER ARTHROSCOPY WITH DEBRIDEMENT AND BICEP TENDON REPAIR;  Surgeon: Bjorn PippinVarkey, Dax T, MD;  Location: [facility];  Service: Orthopedics;  Laterality: Right;  . SHOULDER ARTHROSCOPY WITH ROTATOR CUFF REPAIR Right 09/25/2017   Procedure: SHOULDER ARTHROSCOPY WITH ROTATOR CUFF REPAIR;  Surgeon: Bjorn PippinVarkey, Dax T, MD;  Location: [facility];  Service: Orthopedics;  Laterality: Right;  . TONSILLECTOMY  as a child  . TUBAL LIGATION  12/29/2000     OB History    Gravida  2   Para  2   Term  2   Preterm      AB      Living  2     SAB      TAB      Ectopic      Multiple      Live Births              Family History    Problem Relation Age of Onset  . Diabetes Mother   . Hypertension Mother   . Thyroid disease Sister   . Heart attack Maternal Grandmother   . Stroke Maternal Grandfather   . Asthma Son   . Asthma Daughter   . Colon cancer Neg Hx   . Colon polyps Neg Hx   . Esophageal cancer Neg Hx   . Rectal cancer Neg Hx   . Stomach cancer Neg Hx     Social History   Tobacco Use  . Smoking status: Never Smoker  . Smokeless tobacco: Never Used  Substance Use Topics  . Alcohol use: Yes    Comment: occasional   . Drug use: No    Home Medications Prior to Admission medications   Medication Sig Start Date End Date Taking? Authorizing Provider  acetaminophen (TYLENOL) 325 MG tablet Take 650 mg by mouth every 6 (six) hours as needed for mild pain or headache.  [provider]  albuterol (VENTOLIN HFA) 108 (90 Base) MCG/ACT inhaler Inhale 2 puffs into the lungs every 6 (six) hours as needed for wheezing or shortness of breath. 07/22/19   [name], FNP  cetirizine (ZYRTEC) 10 MG tablet Take 1 tablet (10 mg total) by mouth daily. Patient not taking: Reported on 08/10/2019 01/09/18   Kennith Gain, MD  esomeprazole (NEXIUM) 40 MG capsule TAKE 1 CAPSULE BY MOUTH EVERY MORNING Patient taking differently: Take 40 mg by mouth daily.  02/05/17   Scot Jun, FNP  ferrous sulfate 325 (65 FE) MG EC tablet Take 1 tablet (325 mg total) by mouth 2 (two) times daily with a meal. 02/05/17   Armbruster, Carlota Raspberry, MD  Fluticasone-Salmeterol (ADVAIR DISKUS) 250-50 MCG/DOSE AEPB Inhale 1 puff into the lungs in the morning and at bedtime. 07/23/19   Spero Geralds, MD  ibuprofen (ADVIL,MOTRIN) 200 MG tablet Take 200-400 mg by mouth every 6 (six) hours as needed for headache or mild pain.    [provider]  loratadine (CLARITIN) 10 MG tablet Take 1 tablet (10 mg total) by mouth daily. 12/24/17   Azzie Glatter, FNP  polyethylene glycol [facility] / Floria Raveling) packet Take 17 g by  mouth daily as needed for mild constipation.     [provider]  predniSONE (STERAPRED UNI-PAK 21 TAB) 5 MG (21) TBPK tablet Take as directed Patient not taking: Reported on 08/10/2019 08/03/19   Aundra Dubin, PA-C  sucralfate (CARAFATE) 1 g tablet Take 1 tablet (1 g total) by mouth 4 (four) times daily -  with meals and at bedtime. 08/15/19   Mirelle Biskup, Jarrett Soho, PA-C  tiZANidine (ZANAFLEX) 2 MG tablet Take 1 tablet (2 mg total) by mouth 2 (two) times daily as needed for muscle spasms. 08/03/19   Aundra Dubin, PA-C  Vitamin D, Ergocalciferol, (DRISDOL) 1.25 MG (50000 UNIT) CAPS capsule Take 1 capsule (50,000 Units total) by mouth 2 (two) times a week. 07/22/19   [name], [place]    Allergies    Patient has no known allergies.  Review of Systems   Review of Systems  Constitutional: Positive for fatigue. Negative for appetite change, diaphoresis, fever and unexpected weight change.  HENT: Negative for mouth sores.   Eyes: Negative for visual disturbance.  Respiratory: Positive for shortness of breath. Negative for cough, chest tightness and wheezing.   Cardiovascular: Positive for chest pain.  Gastrointestinal: Positive for nausea. Negative for abdominal pain, constipation, diarrhea and vomiting.  Endocrine: Negative for polydipsia, polyphagia and polyuria.  Genitourinary: Negative for dysuria, frequency, hematuria and urgency.  Musculoskeletal: Negative for back pain and neck stiffness.  Skin: Negative for rash.  Allergic/Immunologic: Negative for immunocompromised state.  Neurological: Positive for headaches. Negative for syncope and light-headedness.  Hematological: Does not bruise/bleed easily.  Psychiatric/Behavioral: Negative for sleep disturbance. The patient is not nervous/anxious.     Physical Exam Updated Vital Signs BP 118/77   Pulse 81   Temp (!) 97.5 F (36.4 C) (Oral)   Resp 16   LMP 12/27/2003   SpO2 100%   Physical Exam Vitals and nursing note  reviewed.  Constitutional:      General: She is not in acute distress.    Appearance: She is not diaphoretic.  HENT:     Head: Normocephalic.  Eyes:     General: No scleral icterus.    Conjunctiva/sclera: Conjunctivae normal.  Cardiovascular:     Rate and Rhythm: Normal rate and regular rhythm.  Pulses: Normal pulses.          Radial pulses are 2+ on the right side and 2+ on the left side.  Pulmonary:     Effort: No tachypnea, accessory muscle usage, prolonged expiration, respiratory distress or retractions.     Breath sounds: No stridor.     Comments: Equal chest rise. No increased work of breathing. Abdominal:     General: There is no distension.     Palpations: Abdomen is soft.     Tenderness: There is no abdominal tenderness. There is no guarding or rebound.  Musculoskeletal:     Cervical back: Normal range of motion.     Comments: Moves all extremities equally and without difficulty.  Skin:    General: Skin is warm and dry.     Capillary Refill: Capillary refill takes less than 2 seconds.  Neurological:     Mental Status: She is alert.     GCS: GCS eye subscore is 4. GCS verbal subscore is 5. GCS motor subscore is 6.     Comments: Speech is clear and goal oriented.  Psychiatric:        Mood and Affect: Mood normal.     ED Results / Procedures / Treatments   Labs (all labs ordered are listed, but only abnormal results are displayed) Labs Reviewed  BASIC METABOLIC PANEL - Abnormal; Notable for the following components:      Result Value   Glucose, Bld 105 (*)    All other components within normal limits  CBC - Abnormal; Notable for the following components:   Hemoglobin 10.9 (*)    HCT 35.6 (*)    MCV 79.3 (*)    MCH 24.3 (*)    All other components within normal limits  HEPATIC FUNCTION PANEL  LIPASE, BLOOD  D-DIMER, QUANTITATIVE (NOT AT [facility])  I-STAT BETA HCG BLOOD, ED ([facility], [facility], [facility] ONLY)  POC SARS CORONAVIRUS 2 AG -  ED  TROPONIN I (HIGH SENSITIVITY)   TROPONIN I (HIGH SENSITIVITY)    EKG EKG Interpretation  Date/Time:  Sunday August 15 2019 03:09:23 EDT Ventricular Rate:  73 PR Interval:  152 QRS Duration: 90 QT Interval:  392 QTC Calculation: 431 R Axis:   66 Text Interpretation: Normal sinus rhythm Confirmed by Nicanor Alcon, April ([id number]) on 08/15/2019 5:46:59 AM   Radiology DG Chest 2 View  Result Date: 08/15/2019 CLINICAL DATA:  Initial evaluation for acute chest pain, shortness of breath, palpitations. EXAM: CHEST - 2 VIEW COMPARISON:  Prior radiograph from 08/09/2019. FINDINGS: The cardiac and mediastinal silhouettes are stable in size and contour, and remain within normal limits. The lungs are normally inflated. No airspace consolidation, pleural effusion, or pulmonary edema. No pneumothorax. No acute osseous abnormality. IMPRESSION: No active cardiopulmonary disease. Electronically Signed   By: Rise Mu M.D.   On: 08/15/2019 05:31    Procedures Procedures (including critical care time)  Medications Ordered in ED Medications  sodium chloride flush (NS) 0.9 % injection 3 mL (3 mLs Intravenous Not Given 08/15/19 3244)    ED Course  I have reviewed the triage vital signs and the nursing notes.  Pertinent labs & imaging results that were available during my care of the patient were reviewed by me and considered in my medical decision making (see chart for details).  Clinical Course as of Aug 14 698  Sun Aug 15, 2019  0354 baseline  Hemoglobin(!): 10.9 [HM]    Clinical Course User Index [HM] Rosalina Dingwall, Dahlia Client, [place]  MDM Rules/Calculators/A&P HEAR Score: 2                     [name] was evaluated in Emergency Department on 08/15/2019 for the symptoms described in the history of present illness. She was evaluated in the context of the global COVID-19 pandemic, which necessitated consideration that the patient might be at risk for infection with the SARS-CoV-2 virus that causes COVID-19.  Institutional protocols and algorithms that pertain to the evaluation of patients at risk for COVID-19 are in a state of rapid change based on information released by regulatory bodies including the CDC and federal and state organizations. These policies and algorithms were followed during the patient's care in the ED.  Patient presents with chest pain, palpitations, nausea, fatigue.  Patient reports these have been ongoing intermittently for an entire week.  No history of cardiac disease.  Concern for possible ACS, GERD, pulmonary embolism, Covid.  6:25 AM Covid antigen negative.  Anemia appears to be baseline.  Initial troponin is within normal limits.  Patient without tachycardia.  No risk factors for pulmonary embolism.  D-dimer negative.  No elevation in lipase or AST/ALT to suggest choledocholithiasis.  Patient does report she is feeling some better.  Awaiting repeat troponin.  7:00 AM Initial and delta trop are negative.  Doubt ACS.  Symptoms sound most consistent with GERD.  She reports she recently changed her PPI to Protonix 40mg  BID.  I will add Carafate.  Will also refer to cardiology for further evaluation.   The patient was discussed with Dr. who agrees with the treatment plan.   Final Clinical Impression(s) / ED Diagnoses Final diagnoses:  Palpitations  Chest pain, unspecified type  Lightheadedness    Rx / DC Orders ED Discharge Orders         Ordered    sucralfate (CARAFATE) 1 g tablet  3 times daily with meals & bedtime     08/15/19 0657           Reygan Heagle, 10/15/19, PA-C 08/15/19 0700    Palumbo, April, MD 08/15/19 10/15/19

## 2019-08-15 NOTE — Discharge Instructions (Signed)
1. Medications: continue Protonix, use carafate with each meal, usual home medications 2. Treatment: rest, drink plenty of fluids,  3. Follow Up: Please followup with your primary doctor in 2 days; cardiology follow-up in 1 week. Please return to the ER for new or worsening symptoms

## 2019-08-16 ENCOUNTER — Encounter: Payer: Self-pay | Admitting: Podiatry

## 2019-08-16 ENCOUNTER — Ambulatory Visit (INDEPENDENT_AMBULATORY_CARE_PROVIDER_SITE_OTHER): Payer: 59

## 2019-08-16 ENCOUNTER — Ambulatory Visit (INDEPENDENT_AMBULATORY_CARE_PROVIDER_SITE_OTHER): Payer: 59 | Admitting: Podiatry

## 2019-08-16 ENCOUNTER — Other Ambulatory Visit: Payer: Self-pay | Admitting: Podiatry

## 2019-08-16 VITALS — Temp 97.9°F

## 2019-08-16 DIAGNOSIS — M79671 Pain in right foot: Secondary | ICD-10-CM

## 2019-08-16 DIAGNOSIS — M7662 Achilles tendinitis, left leg: Secondary | ICD-10-CM | POA: Diagnosis not present

## 2019-08-16 DIAGNOSIS — M7661 Achilles tendinitis, right leg: Secondary | ICD-10-CM | POA: Diagnosis not present

## 2019-08-16 DIAGNOSIS — M7660 Achilles tendinitis, unspecified leg: Secondary | ICD-10-CM

## 2019-08-16 DIAGNOSIS — M7731 Calcaneal spur, right foot: Secondary | ICD-10-CM | POA: Diagnosis not present

## 2019-08-16 MED ORDER — METHYLPREDNISOLONE 4 MG PO TBPK: ORAL_TABLET | ORAL | 0 refills | Status: DC

## 2019-08-16 NOTE — Patient Instructions (Signed)

## 2019-08-19 ENCOUNTER — Ambulatory Visit (INDEPENDENT_AMBULATORY_CARE_PROVIDER_SITE_OTHER): Payer: 59 | Admitting: Gastroenterology

## 2019-08-19 ENCOUNTER — Encounter: Payer: Self-pay | Admitting: Gastroenterology

## 2019-08-19 VITALS — BP 132/70 | HR 80 | Temp 98.0°F | Ht 64.0 in | Wt 241.0 lb

## 2019-08-19 DIAGNOSIS — D509 Iron deficiency anemia, unspecified: Secondary | ICD-10-CM

## 2019-08-19 DIAGNOSIS — K219 Gastro-esophageal reflux disease without esophagitis: Secondary | ICD-10-CM | POA: Diagnosis not present

## 2019-08-19 DIAGNOSIS — R1110 Vomiting, unspecified: Secondary | ICD-10-CM

## 2019-08-19 DIAGNOSIS — R002 Palpitations: Secondary | ICD-10-CM

## 2019-08-19 DIAGNOSIS — R1310 Dysphagia, unspecified: Secondary | ICD-10-CM

## 2019-08-19 MED ORDER — SUCRALFATE 1 G PO TABS: 1.0000 g | ORAL_TABLET | Freq: Four times a day (QID) | ORAL | 2 refills | Status: DC

## 2019-08-19 MED ORDER — PANTOPRAZOLE SODIUM 40 MG PO TBEC: 40.0000 mg | DELAYED_RELEASE_TABLET | Freq: Two times a day (BID) | ORAL | 3 refills | Status: DC

## 2019-08-19 NOTE — Patient Instructions (Addendum)
If you are age 44 or older, your body mass index should be between 23-30. Your Body mass index is 41.37 kg/m. If this is out of the aforementioned range listed, please consider follow up with your Primary Care Provider.  If you are age 55 or younger, your body mass index should be between 19-25. Your Body mass index is 41.37 kg/m. If this is out of the aformentioned range listed, please consider follow up with your Primary Care Provider.    You have been scheduled for a Barium Esophogram at [facility] Radiology (1st floor of the hospital) on Thursday, 09-02-19 at 9:30am. Please arrive 15 minutes prior to your appointment for registration. Make certain not to have anything to eat or drink 3 hours prior to your test. If you need to reschedule for any reason, please contact radiology at [phone] to do so. __________________________________________________________________ A barium swallow is an examination that concentrates on views of the esophagus. This tends to be a double contrast exam (barium and two liquids which, when combined, create a gas to distend the wall of the oesophagus) or single contrast (non-ionic iodine based). The study is usually tailored to your symptoms so a good history is essential. Attention is paid during the study to the form, structure and configuration of the esophagus, looking for functional disorders (such as aspiration, dysphagia, achalasia, motility and reflux) EXAMINATION You may be asked to change into a gown, depending on the type of swallow being performed. A radiologist and radiographer will perform the procedure. The radiologist will advise you of the type of contrast selected for your procedure and direct you during the exam. You will be asked to stand, sit or lie in several different positions and to hold a small amount of fluid in your mouth before being asked to swallow while the imaging is performed .In some instances you may be asked to swallow barium coated  marshmallows to assess the motility of a solid food bolus. The exam can be recorded as a digital or video fluoroscopy procedure. POST PROCEDURE It will take 1-2 days for the barium to pass through your system. To facilitate this, it is important, unless otherwise directed, to increase your fluids for the next 24-48hrs and to resume your normal diet.  This test typically takes about 30 minutes to perform. __________________________________________________________________________________   We have sent the following medications to your pharmacy for you to pick up at your convenience: Protonix 40 mg: Take twice a day Carafate tablets: Take 1 tablet every 6 hours as needed. Note: Make a slurry by slowly dissolving 1 tablet in 1 tablespoon of distilled water before taking  Take your iron every day.  Please contact me after your Cardiology appointment in 2 weeks.   Continue to work on Weight loss.  Thank you for entrusting me with your care and for choosing [facility], Dr. Ileene Patrick

## 2019-08-19 NOTE — Progress Notes (Signed)
HPI :  44 year old female here for a follow-up visit.  I last saw her in July 2019.  She has a history of GERD, regurgitation, iron deficiency anemia, here for follow-up.  She has had an extensive evaluation as outlined below for history of reflux, and throat irritation.  Her current regimen is Protonix 40 mg twice a daily and Carafate tablets every 6 hours as needed for breakthrough.  She states she does not take the Carafate much because she cannot swallow tablets very well.  She continues have symptoms of regurgitation that bother her.  She has occasional pyrosis as well.  She has ongoing dysphagia periodically to solids and liquids.  At her last visit with me in 2019 she had a gastric emptying study which was negative for gastroparesis.  She then underwent esophageal manometry which was normal.  She then underwent a pH impedance study on PPI which showed a DeMeester of nine and well-controlled reflux on PPI.  She did have a positive symptom index with episodes of regurgitation.  Overall it was thought she had a hypersensitive esophagus in the setting of regurgitation.  This was similar to pH impedance testing that was done at [facility] in 2015.  We had discussed potentially using a TCA versus surgical evaluation.  Given her weight she is not a candidate for TIF nor traditional Nissen.  She currently weighs 241 pounds with a BMI of 41.  More recently she has been having problems with chest discomfort in the setting of palpitations and dizziness/lightheadedness.  She was recently taken by ambulance to the emergency room for episodes.  She states she was reported have a heart rate of 160 on her watch.  She states she felt like she was in a pass out with this.  She is scheduled to see a cardiologist within the next 2 weeks.  These episodes occur intermittently.  She has some nausea but no vomiting.  Otherwise she has dark stools at baseline in the setting of taking iron and Carafate.  She takes iron  only once every few days.  She has had an extensive evaluation for iron deficiency anemia in the past.  She has had an EGD, colonoscopy, capsule endoscopy as outlined below.  She has been maintained on oral iron and her anemia had resolved at the time of the work-up.  She has not been compliant with the iron and her hemoglobin has slightly down treated, most recently in the 10.9 with an MCV of 79. Recent iron studies earlier this year were normal. She does not have menstrual cycles.  Prior workup: Capsule endoscopy - 06/23/17 - complete study with good prep, no clear cause for iron deficiency EGD 02/20/2017 - 2cm HH, otherwise normal exam, biopsies negative for HP Colonoscopy 02/20/2017 - focal mild erythema in the rectum - biopsied showed no evidence of colitis, otherwise normal colon and ileum  MRI liver / abdomen - 02/10/2017 - focal fatty infiltration, normal biliary tree, otherwise no pathology noted in the abdomen  Historic evaluation: CT scan neck 12/02/16 - normal CT angio of chest 12/02/2016 - normal EGD 08/19/2013 - Dr. Alycia Rossetti - normal esophagus, mild gastritis Esophageal manometry 2015 - normal PH study 2015 - normal exam, Demeester score of 5.7, positive symptom index for regurgitation and reflux EGG normal 2015 Normal hydrogen breath test 2015 Normal gastric emptying scan 2015 [facility] - delayed in 2011 - Reglan caused twitching and it was stopped.  GES 11/13/2017 - normal  Manometry / 24 hr pH impedance  testing was recommended  Esophageal manometry 01/12/18 - normal PH impedance study 01/12/18 - Demeester score 9, SAP positively correlates with reflux, good acid suppression on PPI   Past Medical History:  Diagnosis Date  . Allergy   . Anemia   . Angio-edema   . Anxiety   . Arthritis   . Constipation   . GERD (gastroesophageal reflux disease)   . Hx of endometriosis   . Insomnia   . Macromastia 07/2011  . Migraine   . Urticaria      Past Surgical History:  Procedure  Laterality Date  . 24 HOUR PH STUDY N/A 01/12/2018   Procedure: 24 HOUR PH STUDY;  Surgeon: Napoleon FormNandigam, Kavitha V, MD;  Location: [facility];  Service: Endoscopy;  Laterality: N/A;  . ABDOMINAL HYSTERECTOMY  12/12/2003  . ADENOIDECTOMY  05/2011  . ANKLE ARTHROSCOPY  12/29/2007   right; with extensive debridement  . BLADDER NECK RECONSTRUCTION  01/14/2011   Procedure: BLADDER NECK REPAIR;  Surgeon: Reva Boresanya S Pratt, MD;  Location: [facility];  Service: Gynecology;  Laterality: N/A;  Laparoscopic Repair of Incidental Cystotomy  . BREAST REDUCTION SURGERY  08/05/2011   Procedure: MAMMARY REDUCTION  (BREAST);  Surgeon: Louisa SecondGerald Truesdale, MD;  Location: [facility];  Service: Plastics;  Laterality: Bilateral;  bilateral  . CESAREAN SECTION  12/29/2000; 1994  . DILATION AND CURETTAGE OF UTERUS  07/08/2003   open laparoscopy  . ESOPHAGEAL MANOMETRY N/A 01/12/2018   Procedure: ESOPHAGEAL MANOMETRY (EM);  Surgeon: Napoleon FormNandigam, Kavitha V, MD;  [facility]: [facility];  Service: Endoscopy;  Laterality: N/A;  . GIVENS CAPSULE STUDY N/A 06/23/2017   Procedure: GIVENS CAPSULE STUDY;  Surgeon: Benancio DeedsArmbruster, Yosmar Ryker P, MD;  [facility];  Service: Gastroenterology;  Laterality: N/A;  . LAPAROSCOPIC SALPINGOOPHERECTOMY  01/14/2011   left  . PH IMPEDANCE STUDY N/A 01/12/2018   Procedure: PH IMPEDANCE STUDY;  Surgeon: Napoleon FormNandigam, Kavitha V, MD;  [facility]: [facility];  Service: Endoscopy;  Laterality: N/A;  . REPAIR PERONEAL TENDONS ANKLE  01/03/2010   repair right subluxing peroneal tendons  . RESECTION DISTAL CLAVICAL Right 09/25/2017   Procedure: RESECTION DISTAL CLAVICAL;  Surgeon: Bjorn PippinVarkey, Dax T, MD;  Location: [facility];  Service: Orthopedics;  Laterality: Right;  . RIGHT OOPHORECTOMY     with lysis of adhesions  . SHOULDER ACROMIOPLASTY Right 09/25/2017   Procedure: SHOULDER ACROMIOPLASTY;  Surgeon: Bjorn PippinVarkey, Dax T, MD;  Location: [facility];  Service: Orthopedics;  Laterality: Right;   . SHOULDER ARTHROSCOPY WITH DEBRIDEMENT AND BICEP TENDON REPAIR Right 09/25/2017   Procedure: SHOULDER ARTHROSCOPY WITH DEBRIDEMENT AND BICEP TENDON REPAIR;  Surgeon: Bjorn PippinVarkey, Dax T, MD;  Location: [facility];  Service: Orthopedics;  Laterality: Right;  . SHOULDER ARTHROSCOPY WITH ROTATOR CUFF REPAIR Right 09/25/2017   Procedure: SHOULDER ARTHROSCOPY WITH ROTATOR CUFF REPAIR;  Surgeon: Bjorn PippinVarkey, Dax T, MD;  Location: [facility];  Service: Orthopedics;  Laterality: Right;  . TONSILLECTOMY  as a child  . TUBAL LIGATION  12/29/2000   Family History  Problem Relation Age of Onset  . Diabetes Mother   . Hypertension Mother   . Thyroid disease Sister   . Heart attack Maternal Grandmother   . Stroke Maternal Grandfather   . Asthma Son   . Asthma Daughter   . Colon cancer Neg Hx   . Colon polyps Neg Hx   . Esophageal cancer Neg Hx   . Rectal cancer Neg Hx   . Stomach cancer Neg Hx  Social History   Tobacco Use  . Smoking status: Never Smoker  . Smokeless tobacco: Never Used  Substance Use Topics  . Alcohol use: Yes    Comment: occasional   . Drug use: No   Current Outpatient Medications  Medication Sig Dispense Refill  . acetaminophen (TYLENOL) 325 MG tablet Take 650 mg by mouth every 6 (six) hours as needed for mild pain or headache.    . albuterol (VENTOLIN HFA) 108 (90 Base) MCG/ACT inhaler Inhale 2 puffs into the lungs every 6 (six) hours as needed for wheezing or shortness of breath. 18 g 0  . cetirizine (ZYRTEC) 10 MG tablet Take 1 tablet (10 mg total) by mouth daily. 30 tablet 5  . ferrous sulfate 325 (65 FE) MG EC tablet Take 1 tablet (325 mg total) by mouth 2 (two) times daily with a meal. 60 tablet 3  . Fluticasone-Salmeterol (ADVAIR DISKUS) 250-50 MCG/DOSE AEPB Inhale 1 puff into the lungs in the morning and at bedtime. 60 each 5  . ibuprofen (ADVIL,MOTRIN) 200 MG tablet Take 200-400 mg by mouth every 6 (six) hours as needed for headache or mild  pain.    [name] loratadine (CLARITIN) 10 MG tablet Take 1 tablet (10 mg total) by mouth daily. 30 tablet 11  . methylPREDNISolone (MEDROL DOSEPAK) 4 MG TBPK tablet Take as directed 21 tablet 0  . pantoprazole (PROTONIX) 40 MG tablet Take 40 mg by mouth 2 (two) times daily.    . polyethylene glycol (MIRALAX / GLYCOLAX) packet Take 17 g by mouth daily as needed for mild constipation.     . sucralfate (CARAFATE) 1 g tablet Take 1 tablet (1 g total) by mouth 4 (four) times daily -  with meals and at bedtime. 30 tablet 0  . tiZANidine (ZANAFLEX) 2 MG tablet Take 1 tablet (2 mg total) by mouth 2 (two) times daily as needed for muscle spasms. 20 tablet 0  . Vitamin D, Ergocalciferol, (DRISDOL) 1.25 MG (50000 UNIT) CAPS capsule Take 1 capsule (50,000 Units total) by mouth 2 (two) times a week. 24 capsule 1   No current facility-administered medications for this visit.   No Known Allergies   Review of Systems: All systems reviewed and negative except where noted in HPI.    DG Chest 2 View  Result Date: 08/15/2019 CLINICAL DATA:  Initial evaluation for acute chest pain, shortness of breath, palpitations. EXAM: CHEST - 2 VIEW COMPARISON:  Prior radiograph from 08/09/2019. FINDINGS: The cardiac and mediastinal silhouettes are stable in size and contour, and remain within normal limits. The lungs are normally inflated. No airspace consolidation, pleural effusion, or pulmonary edema. No pneumothorax. No acute osseous abnormality. IMPRESSION: No active cardiopulmonary disease. Electronically Signed   By: Rise Mu M.D.   On: 08/15/2019 05:31   DG Chest 2 View  Result Date: 08/09/2019 CLINICAL DATA:  Right leg pain.  Left upper quadrant chest pain. EXAM: CHEST - 2 VIEW COMPARISON:  June 05, 2019 FINDINGS: The heart size and mediastinal contours are within normal limits. Both lungs are clear. The visualized skeletal structures are unremarkable. IMPRESSION: No active cardiopulmonary disease.  Electronically Signed   By: Katherine Mantle M.D.   On: 08/09/2019 23:25   DG Foot Complete Right  Result Date: 08/16/2019 Please see detailed radiograph report in office note.  DG HIP UNILAT W OR W/O PELVIS 2-3 VIEWS RIGHT  Result Date: 08/10/2019 CLINICAL DATA:  Initial evaluation for acute right hip pain. No injury. EXAM: DG HIP (WITH OR  WITHOUT PELVIS) 2-3V RIGHT COMPARISON:  None. FINDINGS: No acute fracture dislocation. Femoral head height maintained. Subtle calcification at the right greater trochanter could reflect underlying calcific tendinopathy. Joint spaces maintained without evidence for significant degenerative arthropathy. No erosive changes. Bony pelvis intact. SI joints approximated symmetric. Limited views of the left hip unremarkable. Osseous mineralization normal. No visible soft tissue injury. IMPRESSION: 1. No acute osseous abnormality about the right hip. 2. Subtle calcification at the right greater trochanter, which could reflect underlying calcific tendinopathy. Electronically Signed   By: Rise Mu M.D.   On: 08/10/2019 02:12   XR Cervical Spine 2 or 3 views  Result Date: 08/03/2019 Abnormal straightening of the cervical spine.  XR Shoulder Right  Result Date: 08/03/2019 No acute or structural abnormalities  Lab Results  Component Value Date   WBC 6.6 08/15/2019   HGB 10.9 (L) 08/15/2019   HCT 35.6 (L) 08/15/2019   MCV 79.3 (L) 08/15/2019   PLT 365 08/15/2019    Lab Results  Component Value Date   CREATININE 0.94 08/15/2019   BUN 8 08/15/2019   NA 138 08/15/2019   K 3.7 08/15/2019   CL 102 08/15/2019   CO2 26 08/15/2019    Lab Results  Component Value Date   ALT 19 08/15/2019   AST 22 08/15/2019   ALKPHOS 68 08/15/2019   BILITOT 0.4 08/15/2019   Lab Results  Component Value Date   IRON 60 06/17/2019   IRON 60 06/17/2019   TIBC 315 06/17/2019   TIBC 346 06/17/2019   FERRITIN 101 06/17/2019   FERRITIN 102 06/17/2019      Physical Exam: BP 132/70   Pulse 80   Temp 98 F (36.7 C)   Ht 5\' 4"  (1.626 m)   Wt 241 lb (109.3 kg)   LMP 12/27/2003   BMI 41.37 kg/m  Constitutional: Pleasant,well-developed, female in no acute distress. Abdominal: Soft, nondistended, nontender.  There are no masses palpable. No hepatomegaly. Extremities: no edema Lymphadenopathy: No cervical adenopathy noted. Neurological: Alert and oriented to person place and time. Skin: Skin is warm and dry. No rashes noted. Psychiatric: Normal mood and affect. Behavior is normal.   ASSESSMENT AND PLAN: 44 year old female here for reassessment of the following issues:  GERD with regurgitation and hypersensitive esophagus - extensive evaluation as outlined above with persistent symptoms over the years.  She does not appear to have gastroparesis based on most recent gastric emptying study.  She has had 2 manometry's with pH studies which have showed normal esophageal motility in regards to her dysphagia, while her pH studies on PPI have shown well-controlled reflux however she has hypersensitive esophagus with episodes of regurgitation.  Difficult situation.  I think if she stops her PPI she will feel much worse if this is more acidic reflux, so will continue that for now.  I have tried to get her liquid Carafate however her insurance does not cover it.  We will teach her how to dissolve Carafate tablets to make a liquid concoction to drink every 6 hours as needed.  Ultimately management of hypersensitive esophagus would be with a regimen such as a TCA.  Given her ongoing cardiac symptoms and pending cardiology consult, would await that work-up prior to starting this TCA.  Otherwise in regards to treatment of her regurgitation/reflux, one would consider surgical therapy at this point. Unfortunately due to her weight she is not a candidate for TIF nor is she a candidate for Nissen.  She would need to lose weight  to be a candidate for Nissen, if she  cannot do that then her option would be gastric bypass.  We discussed weight loss and importance of this, she will work on this.  I will await her cardiology evaluation and if she does not have any significant arrhythmia we will probably start her on a TCA.  She agreed with the plan, all questions answered  Dysphagia - motility is normal.  Prior EGD has not shown a clear cause.  Although it has been a while since she has had her esophagus evaluated.  I offered her a barium swallow tablet to see if we can help localize where her dysphagia is coming from and to determine if empiric dilation may be beneficial.  We will hold off on EGD until her cardiac evaluation is done.  She agreed  Iron deficiency anemia - status post EGD, colonoscopy, capsule endoscopy without clear cause as above.  She is on iron and her iron studies are actually normal, she has a persistent mild microcytic anemia, unclear if she may have a component of thalassemia.  Recommend she take her iron daily.  I suspect this is the cause of her dark stool.  Palpitations -associated with dizziness/lightheadedness.  Pending cardiology consult, will await that evaluation prior to initiation of TCA and proceeding with endoscopy if needed.  She agreed  [place] Cellar, MD [facility]  I spent 35 minutes of time, including in depth chart review, independent review of results as outlined above, communicating results with the patient directly, face-to-face time with the patient, coordinating care, and ordering studies and medications as appropriate, and documentation.

## 2019-08-20 ENCOUNTER — Other Ambulatory Visit: Payer: Self-pay

## 2019-08-20 ENCOUNTER — Ambulatory Visit: Payer: 59 | Admitting: Rehabilitative and Restorative Service Providers"

## 2019-08-20 ENCOUNTER — Encounter: Payer: Self-pay | Admitting: Rehabilitative and Restorative Service Providers"

## 2019-08-20 ENCOUNTER — Ambulatory Visit: Payer: 59 | Admitting: Physical Therapy

## 2019-08-20 DIAGNOSIS — M546 Pain in thoracic spine: Secondary | ICD-10-CM | POA: Diagnosis not present

## 2019-08-20 DIAGNOSIS — M25511 Pain in right shoulder: Secondary | ICD-10-CM | POA: Diagnosis not present

## 2019-08-20 DIAGNOSIS — G8929 Other chronic pain: Secondary | ICD-10-CM | POA: Diagnosis not present

## 2019-08-20 DIAGNOSIS — M542 Cervicalgia: Secondary | ICD-10-CM | POA: Diagnosis not present

## 2019-08-20 NOTE — Therapy (Addendum)
[facility] [address] [place], [place], [address] Phone: [phone]   Fax:  [phone]  Physical Therapy Treatment/Discharge  Patient Details  Name: [name] MRN: [id number] Date of Birth: February 09, 1976 Referring Provider (PT): Aundra Dubin, [place]   Encounter Date: 08/20/2019  PT End of Session - 08/20/19 1428    Visit Number  2    Number of Visits  12    Date for PT Re-Evaluation  09/20/19    Authorization Type  [facility]    PT Start Time  1409    PT Stop Time  1447    PT Time Calculation (min)  38 min    Activity Tolerance  Patient tolerated treatment well    Behavior During Therapy  [facility] for tasks assessed/performed       Past Medical History:  Diagnosis Date  . Allergy   . Anemia   . Angio-edema   . Anxiety   . Arthritis   . Constipation   . GERD (gastroesophageal reflux disease)   . Hx of endometriosis   . Insomnia   . Macromastia 07/2011  . Migraine   . Urticaria     Past Surgical History:  Procedure Laterality Date  . [place] STUDY N/A 01/12/2018   Procedure: [place] STUDY;  Surgeon: Mauri Pole, MD;  [facility]: [facility];  Service: Endoscopy;  Laterality: N/A;  . ABDOMINAL HYSTERECTOMY  12/12/2003  . ADENOIDECTOMY  05/2011  . ANKLE ARTHROSCOPY  12/29/2007   right; with extensive debridement  . BLADDER NECK RECONSTRUCTION  01/14/2011   Procedure: BLADDER NECK REPAIR;  Surgeon: Donnamae Jude, MD;  Location: [facility];  Service: Gynecology;  Laterality: N/A;  Laparoscopic Repair of Incidental Cystotomy  . BREAST REDUCTION SURGERY  08/05/2011   Procedure: MAMMARY REDUCTION  (BREAST);  Surgeon: Cristine Polio, MD;  Location: [facility];  Service: Plastics;  Laterality: Bilateral;  bilateral  . CESAREAN SECTION  12/29/2000; 1994  . DILATION AND CURETTAGE OF UTERUS  07/08/2003   open laparoscopy  . ESOPHAGEAL MANOMETRY N/A 01/12/2018   Procedure: ESOPHAGEAL MANOMETRY (EM);  Surgeon:  Mauri Pole, MD;  [facility];  Service: Endoscopy;  Laterality: N/A;  . GIVENS CAPSULE STUDY N/A 06/23/2017   Procedure: GIVENS CAPSULE STUDY;  Surgeon: Yetta Flock, MD;  Location: [facility];  Service: Gastroenterology;  Laterality: N/A;  . LAPAROSCOPIC SALPINGOOPHERECTOMY  01/14/2011   left  . [place] IMPEDANCE STUDY N/A 01/12/2018   Procedure: [place] IMPEDANCE STUDY;  Surgeon: Mauri Pole, MD;  [facility];  Service: Endoscopy;  Laterality: N/A;  . REPAIR PERONEAL TENDONS ANKLE  01/03/2010   repair right subluxing peroneal tendons  . RESECTION DISTAL CLAVICAL Right 09/25/2017   Procedure: RESECTION DISTAL CLAVICAL;  Surgeon: Hiram Gash, MD;  Location: [facility];  Service: Orthopedics;  Laterality: Right;  . RIGHT OOPHORECTOMY     with lysis of adhesions  . SHOULDER ACROMIOPLASTY Right 09/25/2017   Procedure: SHOULDER ACROMIOPLASTY;  Surgeon: Hiram Gash, MD;  Location: [facility];  Service: Orthopedics;  Laterality: Right;  . SHOULDER ARTHROSCOPY WITH DEBRIDEMENT AND BICEP TENDON REPAIR Right 09/25/2017   Procedure: SHOULDER ARTHROSCOPY WITH DEBRIDEMENT AND BICEP TENDON REPAIR;  Surgeon: Hiram Gash, MD;  Location: [facility];  Service: Orthopedics;  Laterality: Right;  . SHOULDER ARTHROSCOPY WITH ROTATOR CUFF REPAIR Right 09/25/2017   Procedure: SHOULDER ARTHROSCOPY WITH ROTATOR CUFF REPAIR;  Surgeon: Hiram Gash,  MD;  Location: [facility];  Service: Orthopedics;  Laterality: Right;  . TONSILLECTOMY  as a child  . TUBAL LIGATION  12/29/2000    There were no vitals filed for this visit.  Subjective Assessment - 08/20/19 1412    Subjective  Pt. indicated having several visits at ED due to chest pain/HR issues.  Pt. did report maybe some occasional improvement in shoulder/neck since last visit.    Pertinent History  PMH: Rt shoulder scope and RTC repair 2019, anx,obesity, OA     Limitations  Lifting    Diagnostic tests  Imaging: neck XR "abnormal straightening of C spine. Rt shoulder XR negative    Patient Stated Goals  get rid of the pain    Currently in Pain?  Yes    Pain Score  3     Pain Location  Shoulder    Pain Orientation  Right    Pain Descriptors / Indicators  Aching    Pain Type  Acute pain    Pain Onset  More than a month ago    Pain Frequency  Intermittent    Aggravating Factors   reaching, prolonged sitting.                       [facility] Adult PT Treatment/Exercise - 08/20/19 0001      Exercises   Exercises  Shoulder      Shoulder Exercises: ROM/Strengthening   UBE (Upper Arm Bike)  Lvl 1 4 mins fwd/back each    Other ROM/Strengthening Exercises  standing tband rows, gh ext green band 3 x 10 each      Shoulder Exercises: Stretch   Other Shoulder Stretches  seated upper trap stretch, cross arm stretch 15 sec x 5 Rt      Manual Therapy   Manual therapy comments  active compression c movement to Rt infraspinatus, Rt upper trap             PT Education - 08/20/19 1426    Education Details  Cues for new intervention, TrP education.    Person(s) Educated  Patient    Methods  Explanation;Demonstration;Verbal cues    Comprehension  Verbalized understanding;Returned demonstration          PT Long Term Goals - 08/09/19 1034      PT LONG TERM GOAL #1   Title  Pt will be I and compliant with HEP. (Target for all goals 6 weeks 09/20/19)    Time  6    Period  Weeks    Status  New      PT LONG TERM GOAL #2   Title  Pt will improve neck ROM to WNL without pain.    Status  New      PT LONG TERM GOAL #3   Title  Pt will report overall pain with driving and ususal activity to less than 2-3/10    Status  New            Plan - 08/20/19 1427    Clinical Impression Statement  Rt infraspinatus and Rt upper trap TrP noted c concordant symptoms.  Improved resting symptoms post manual intevention today.  Continued  skilled PT services indicated at this time.    Personal Factors and Comorbidities  Comorbidity 2    Comorbidities  PMH: Rt shoulder scope and RTC repair 2019, anx,obesity, OA    Examination-Activity Limitations  Carry;Lift    Examination-Participation Restrictions  Driving;Laundry    Stability/Clinical  Decision Making  Evolving/Moderate complexity    Rehab Potential  Good    PT Frequency  2x / week    PT Duration  6 weeks    PT Treatment/Interventions  ADLs/Self Care Home Management;Electrical Stimulation;Cryotherapy;Iontophoresis 44m/ml Dexamethasone;Moist Heat;Traction;Ultrasound;Neuromuscular re-education;Therapeutic activities;Manual techniques;Dry needling;Passive range of motion;Taping;Joint Manipulations;Spinal Manipulations    PT Next Visit Plan  TrP treatment, possbile DN for improved myofascial mobility/reduced symptoms. Postural musculature activation    PT Home Exercise Plan  [id number]   Consulted and Agree with Plan of Care  Patient       Patient will benefit from skilled therapeutic intervention in order to improve the following deficits and impairments:  Decreased activity tolerance, Decreased endurance, Decreased range of motion, Decreased strength, Impaired flexibility, Increased muscle spasms, Increased fascial restricitons, Pain, Postural dysfunction  Visit Diagnosis: Cervicalgia  Pain in thoracic spine  Chronic right shoulder pain     Problem List Patient Active Problem List   Diagnosis Date Noted  . Generalized anxiety disorder 04/08/2018  . Atypical chest pain   . Regurgitation of food   . Laryngopharyngeal reflux (LPR) 07/30/2017  . Throat soreness 07/30/2017  . ANA positive 12/30/2016  . Gastroesophageal reflux disease 06/27/2016  . Disturbance of skin sensation 04/27/2014  . Dizziness and giddiness 03/14/2014  . Headache, migraine 03/14/2014  . Myalgia and myositis, unspecified 06/07/2013  . Nausea and vomiting in adult 08/22/2012  . Viral syndrome  08/22/2012  . Pelvic pain in female 12/18/2010  . PID (acute pelvic inflammatory disease) 12/18/2010  . ANEMIA-IRON DEFICIENCY 10/15/2006  . HERPES, GENITAL NOS 09/30/2006  . OBESITY NOS 09/30/2006  . DISORDER, NONORGANIC SLEEP NOS 09/30/2006  . CONSTIPATION NOS 09/30/2006  . NEURITIS, LUMBOSACRAL NOS 09/30/2006  . HX, PERSONAL, MENTAL DISORDER NOS 09/30/2006    MScot Jun PT, DPT, OCS, ATC 08/20/19  3:26 PM  PHYSICAL THERAPY DISCHARGE SUMMARY  Visits from Start of Care: 2  Current functional level related to goals / functional outcomes: See note   Remaining deficits: See note   Education / Equipment: See note, HEP Plan: Patient agrees to discharge.  Patient goals were not met. Patient is being discharged due to not returning since the last visit.  ?????    MScot Jun PT, DPT, OCS, ATC 12/07/19  12:00 PM      [facility] [address] [place] [address]Phone: [phone]  Fax:  [phone] Name: Sheila SIGNERMRN: [id number] of Birth: [birth year]

## 2019-08-20 NOTE — Progress Notes (Signed)
Subjective: 44 year old female presents the office today for concerns of pain to the right heel as well as to the arch.  She describes a pinching sensation in the back of the right heel.  Hurts some with closing shoes.  She also gets some occasional discomfort of the arch of the foot.  No significant swelling although she does get some mild swelling at times.  No redness or warmth.  No recent injury.  No other treatment.  No other concerns. Denies any systemic complaints such as fevers, chills, nausea, vomiting. No acute changes since last appointment, and no other complaints at this time.   Objective: AAO x3, NAD DP/PT pulses palpable bilaterally, CRT less than 3 seconds Tenderness palpation of the posterior aspect the right heel area of a prominent bone spur.  There is no tenderness on the mid substance the Achilles tendon.  Thompson test is negative.  Mild discomfort of the arch of the foot along the medial band of plantar fascia but plantar fascial appears to be intact.  Equinus is present.  No area of pinpoint tenderness.  No open lesions or pre-ulcerative lesions.  No pain with calf compression, swelling, warmth, erythema  Assessment: Insertional Achilles tendinitis, bone spur posterior heel; plantar fasciitis  Plan: -All treatment options discussed with the patient including all alternatives, risks, complications.  -X-rays obtained reviewed.  Calcaneal spurring is present.  There is no evidence of acute fracture. -Try the Medrol Dosepak.  We discussed stretching, icing daily.  Heel lift.  Shoe modifications and good arch support.  Offloading pad to the posterior spur. -Patient encouraged to call the office with any questions, concerns, change in symptoms.   Vivi Barrack DPM

## 2019-08-25 ENCOUNTER — Ambulatory Visit (HOSPITAL_COMMUNITY)
Admission: EM | Admit: 2019-08-25 | Discharge: 2019-08-25 | Disposition: A | Discharge status: Home / Self Care | Payer: 59 | Attending: Internal Medicine | Admitting: Internal Medicine

## 2019-08-25 ENCOUNTER — Other Ambulatory Visit: Payer: Self-pay

## 2019-08-25 ENCOUNTER — Encounter: Payer: Self-pay | Admitting: Nurse Practitioner

## 2019-08-25 ENCOUNTER — Encounter (HOSPITAL_COMMUNITY): Payer: Self-pay

## 2019-08-25 DIAGNOSIS — R1013 Epigastric pain: Secondary | ICD-10-CM | POA: Diagnosis not present

## 2019-08-25 LAB — TSH: TSH: 2.029 u[IU]/mL (ref 0.350–4.500)

## 2019-08-25 LAB — POCT URINALYSIS DIP (DEVICE)
Bilirubin Urine: NEGATIVE
Glucose, UA: NEGATIVE mg/dL
Hgb urine dipstick: NEGATIVE
Ketones, ur: NEGATIVE mg/dL
Leukocytes,Ua: NEGATIVE
Nitrite: NEGATIVE
Protein, ur: NEGATIVE mg/dL
Specific Gravity, Urine: 1.01 (ref 1.005–1.030)
Urobilinogen, UA: 0.2 mg/dL (ref 0.0–1.0)
pH: 7 (ref 5.0–8.0)

## 2019-08-25 LAB — CBC WITH DIFFERENTIAL/PLATELET
Abs Immature Granulocytes: 0.02 10*3/uL (ref 0.00–0.07)
Basophils Absolute: 0 10*3/uL (ref 0.0–0.1)
Basophils Relative: 0 %
Eosinophils Absolute: 0.1 10*3/uL (ref 0.0–0.5)
Eosinophils Relative: 1 %
HCT: 35.5 % — ABNORMAL LOW (ref 36.0–46.0)
Hemoglobin: 11.2 g/dL — ABNORMAL LOW (ref 12.0–15.0)
Immature Granulocytes: 0 %
Lymphocytes Relative: 44 %
Lymphs Abs: 3 10*3/uL (ref 0.7–4.0)
MCH: 24.9 pg — ABNORMAL LOW (ref 26.0–34.0)
MCHC: 31.5 g/dL (ref 30.0–36.0)
MCV: 78.9 fL — ABNORMAL LOW (ref 80.0–100.0)
Monocytes Absolute: 0.5 10*3/uL (ref 0.1–1.0)
Monocytes Relative: 7 %
Neutro Abs: 3.2 10*3/uL (ref 1.7–7.7)
Neutrophils Relative %: 48 %
Platelets: 375 10*3/uL (ref 150–400)
RBC: 4.5 MIL/uL (ref 3.87–5.11)
RDW: 13.4 % (ref 11.5–15.5)
WBC: 6.8 10*3/uL (ref 4.0–10.5)
nRBC: 0 % (ref 0.0–0.2)

## 2019-08-25 MED ORDER — LIDOCAINE VISCOUS HCL 2 % MT SOLN
OROMUCOSAL | Status: AC
  Filled 2019-08-25: qty 15

## 2019-08-25 MED ORDER — PANTOPRAZOLE SODIUM 40 MG PO TBEC: 40.0000 mg | DELAYED_RELEASE_TABLET | Freq: Two times a day (BID) | ORAL | 3 refills | Status: DC

## 2019-08-25 MED ORDER — LIDOCAINE VISCOUS HCL 2 % MT SOLN
15.0000 mL | Freq: Once | OROMUCOSAL | Status: AC
  Administered 2019-08-25: 15 mL via ORAL

## 2019-08-25 MED ORDER — ALUM & MAG HYDROXIDE-SIMETH 200-200-20 MG/5ML PO SUSP
ORAL | Status: AC
  Filled 2019-08-25: qty 30

## 2019-08-25 MED ORDER — SUCRALFATE 1 G PO TABS: 1.0000 g | ORAL_TABLET | Freq: Four times a day (QID) | ORAL | 2 refills | Status: DC

## 2019-08-25 MED ORDER — ALUM & MAG HYDROXIDE-SIMETH 200-200-20 MG/5ML PO SUSP
30.0000 mL | Freq: Once | ORAL | Status: AC
  Administered 2019-08-25: 30 mL via ORAL

## 2019-08-25 NOTE — ED Triage Notes (Signed)
Pt presents to UC with abdominal pain x 1 week. Pt states if she do not eats the abdominal pain is worse.

## 2019-08-26 ENCOUNTER — Telehealth: Payer: 59 | Admitting: Cardiology

## 2019-08-26 NOTE — ED Provider Notes (Signed)
[name]    CSN: [id number] Arrival date & time: 08/25/19  1842      History   Chief Complaint Chief Complaint  Patient presents with  . Abdominal Pain    HPI [name] is a 44 y.o. female comes to urgent care with complaint of abdominal pain of 1 week duration.  Patient has had abdominal pain for at least a few years but the pain has worsened over the past week.  Pain is burning in nature.  She gets temporary relief when she eats but the pain worsens a few hours after she eats.  It does not wake her up out of bed.  She denies any nausea or vomiting.  No change in bowel movement.  No weight loss. Review of the chart shows that she is at EGD and capsule endoscopy.  Capsule endoscopy showed some duodenal erosion.  Patient recently saw her gastroenterologist and was started on Protonix and Carafate.   HPI  Past Medical History:  Diagnosis Date  . Allergy   . Anemia   . Angio-edema   . Anxiety   . Arthritis   . Constipation   . GERD (gastroesophageal reflux disease)   . Hx of endometriosis   . Insomnia   . Macromastia 07/2011  . Migraine   . Urticaria     Patient Active Problem List   Diagnosis Date Noted  . Generalized anxiety disorder 04/08/2018  . Atypical chest pain   . Regurgitation of food   . Laryngopharyngeal reflux (LPR) 07/30/2017  . Throat soreness 07/30/2017  . ANA positive 12/30/2016  . Gastroesophageal reflux disease 06/27/2016  . Disturbance of skin sensation 04/27/2014  . Dizziness and giddiness 03/14/2014  . Headache, migraine 03/14/2014  . Myalgia and myositis, unspecified 06/07/2013  . Nausea and vomiting in adult 08/22/2012  . Viral syndrome 08/22/2012  . Pelvic pain in female 12/18/2010  . PID (acute pelvic inflammatory disease) 12/18/2010  . ANEMIA-IRON DEFICIENCY 10/15/2006  . HERPES, GENITAL NOS 09/30/2006  . OBESITY NOS 09/30/2006  . DISORDER, NONORGANIC SLEEP NOS 09/30/2006  . CONSTIPATION NOS 09/30/2006  .  NEURITIS, LUMBOSACRAL NOS 09/30/2006  . HX, PERSONAL, MENTAL DISORDER NOS 09/30/2006    Past Surgical History:  Procedure Laterality Date  . [place] STUDY N/A 01/12/2018   Procedure: [place] STUDY;  Surgeon: Mauri Pole, MD;  Location: [facility];  Service: Endoscopy;  Laterality: N/A;  . ABDOMINAL HYSTERECTOMY  12/12/2003  . ADENOIDECTOMY  05/2011  . ANKLE ARTHROSCOPY  12/29/2007   right; with extensive debridement  . BLADDER NECK RECONSTRUCTION  01/14/2011   Procedure: BLADDER NECK REPAIR;  Surgeon: Donnamae Jude, MD;  Location: [facility];  Service: Gynecology;  Laterality: N/A;  Laparoscopic Repair of Incidental Cystotomy  . BREAST REDUCTION SURGERY  08/05/2011   Procedure: MAMMARY REDUCTION  (BREAST);  Surgeon: Cristine Polio, MD;  Location: [facility];  Service: Plastics;  Laterality: Bilateral;  bilateral  . CESAREAN SECTION  12/29/2000; 1994  . DILATION AND CURETTAGE OF UTERUS  07/08/2003   open laparoscopy  . ESOPHAGEAL MANOMETRY N/A 01/12/2018   Procedure: ESOPHAGEAL MANOMETRY (EM);  Surgeon: Mauri Pole, MD;  Location: [facility];  Service: Endoscopy;  Laterality: N/A;  . GIVENS CAPSULE STUDY N/A 06/23/2017   Procedure: GIVENS CAPSULE STUDY;  Surgeon: Yetta Flock, MD;  Location: [facility];  Service: Gastroenterology;  Laterality: N/A;  . LAPAROSCOPIC SALPINGOOPHERECTOMY  01/14/2011   left  . [place] IMPEDANCE STUDY N/A  01/12/2018   Procedure: PH IMPEDANCE STUDY;  Surgeon: Napoleon Form, MD;  Location: [facility];  Service: Endoscopy;  Laterality: N/A;  . REPAIR PERONEAL TENDONS ANKLE  01/03/2010   repair right subluxing peroneal tendons  . RESECTION DISTAL CLAVICAL Right 09/25/2017   Procedure: RESECTION DISTAL CLAVICAL;  Surgeon: Bjorn Pippin, MD;  Location: [facility];  Service: Orthopedics;  Laterality: Right;  . RIGHT OOPHORECTOMY     with lysis of adhesions  . SHOULDER ACROMIOPLASTY Right 09/25/2017   Procedure:  SHOULDER ACROMIOPLASTY;  Surgeon: Bjorn Pippin, MD;  Location: [facility];  Service: Orthopedics;  Laterality: Right;  . SHOULDER ARTHROSCOPY WITH DEBRIDEMENT AND BICEP TENDON REPAIR Right 09/25/2017   Procedure: SHOULDER ARTHROSCOPY WITH DEBRIDEMENT AND BICEP TENDON REPAIR;  Surgeon: Bjorn Pippin, MD;  Location: [facility];  Service: Orthopedics;  Laterality: Right;  . SHOULDER ARTHROSCOPY WITH ROTATOR CUFF REPAIR Right 09/25/2017   Procedure: SHOULDER ARTHROSCOPY WITH ROTATOR CUFF REPAIR;  Surgeon: Bjorn Pippin, MD;  Location: [facility];  Service: Orthopedics;  Laterality: Right;  . TONSILLECTOMY  as a child  . TUBAL LIGATION  12/29/2000    OB History    Gravida  2   Para  2   Term  2   Preterm      AB      Living  2     SAB      TAB      Ectopic      Multiple      Live Births               Home Medications    Prior to Admission medications   Medication Sig Start Date End Date Taking? Authorizing Provider  pantoprazole (PROTONIX) 40 MG tablet Take 1 tablet (40 mg total) by mouth 2 (two) times daily. 08/25/19   Burnis Kaser, Britta Mccreedy, MD  sucralfate (CARAFATE) 1 g tablet Take 1 tablet (1 g total) by mouth every 6 (six) hours. Make a slurry by slowly dissolving 1 tablet in 1 Tablespoon of distilled water before taking 08/25/19   Carloyn Lahue, Britta Mccreedy, MD  albuterol (VENTOLIN HFA) 108 (90 Base) MCG/ACT inhaler Inhale 2 puffs into the lungs every 6 (six) hours as needed for wheezing or shortness of breath. 07/22/19 08/25/19  [name], FNP  cetirizine (ZYRTEC) 10 MG tablet Take 1 tablet (10 mg total) by mouth daily. 01/09/18 08/25/19  Marcelyn Bruins, MD  ferrous sulfate 325 (65 FE) MG EC tablet Take 1 tablet (325 mg total) by mouth 2 (two) times daily with a meal. 02/05/17 08/25/19  Armbruster, Willaim Rayas, MD  Fluticasone-Salmeterol (ADVAIR DISKUS) 250-50 MCG/DOSE AEPB Inhale 1 puff into the lungs in the morning and at bedtime.  07/23/19 08/25/19  Charlott Holler, MD  loratadine (CLARITIN) 10 MG tablet Take 1 tablet (10 mg total) by mouth daily. 12/24/17 08/25/19  Kallie Locks, FNP    Family History Family History  Problem Relation Age of Onset  . Diabetes Mother   . Hypertension Mother   . Thyroid disease Sister   . Heart attack Maternal Grandmother   . Stroke Maternal Grandfather   . Asthma Son   . Asthma Daughter   . Colon cancer Neg Hx   . Colon polyps Neg Hx   . Esophageal cancer Neg Hx   . Rectal cancer Neg Hx   . Stomach cancer Neg Hx     Social History Social History  Tobacco Use  . Smoking status: Never Smoker  . Smokeless tobacco: Never Used  Substance Use Topics  . Alcohol use: Yes    Comment: occasional   . Drug use: No     Allergies   Patient has no known allergies.   Review of Systems Review of Systems  Constitutional: Negative for activity change, fatigue and fever.  Respiratory: Negative for cough, chest tightness and shortness of breath.   Gastrointestinal: Positive for abdominal pain. Negative for diarrhea, nausea and vomiting.  Genitourinary: Negative.   Musculoskeletal: Negative.   Neurological: Negative.   Psychiatric/Behavioral: Negative.      Physical Exam Triage Vital Signs ED Triage Vitals  Enc Vitals Group     BP 08/25/19 1905 129/79     Pulse Rate 08/25/19 1905 85     Resp 08/25/19 1905 19     Temp 08/25/19 1905 98.3 F (36.8 C)     Temp Source 08/25/19 1905 Oral     SpO2 08/25/19 1905 100 %     Weight --      Height --      Head Circumference --      Peak Flow --      Pain Score 08/25/19 1903 8     Pain Loc --      Pain Edu? --      Excl. in GC? --    No data found.  Updated Vital Signs BP 129/79 (BP Location: Right Arm)   Pulse 85   Temp 98.3 F (36.8 C) (Oral)   Resp 19   LMP 12/27/2003   SpO2 100%   Visual Acuity Right Eye Distance:   Left Eye Distance:   Bilateral Distance:    Right Eye Near:   Left Eye Near:      Bilateral Near:     Physical Exam Vitals and nursing note reviewed.  Constitutional:      General: She is not in acute distress.    Appearance: She is not ill-appearing.  Cardiovascular:     Heart sounds: Normal heart sounds.  Pulmonary:     Effort: Pulmonary effort is normal. No respiratory distress.     Breath sounds: Normal breath sounds. No stridor. No rhonchi.  Abdominal:     General: Bowel sounds are normal.     Palpations: Abdomen is soft. There is no shifting dullness, fluid wave or hepatomegaly.     Tenderness: There is abdominal tenderness in the periumbilical area. There is no guarding or rebound.     Hernia: No hernia is present.  Skin:    General: Skin is warm.  Neurological:     Mental Status: She is alert.      UC Treatments / Results  Labs (all labs ordered are listed, but only abnormal results are displayed) Labs Reviewed  CBC WITH DIFFERENTIAL/PLATELET - Abnormal; Notable for the following components:      Result Value   Hemoglobin 11.2 (*)    HCT 35.5 (*)    MCV 78.9 (*)    MCH 24.9 (*)    All other components within normal limits  TSH  POCT URINALYSIS DIP (DEVICE)    EKG   Radiology No results found.  Procedures Procedures (including critical care time)  Medications Ordered in UC Medications  alum & mag hydroxide-simeth (MAALOX/MYLANTA) 200-200-20 MG/5ML suspension 30 mL (30 mLs Oral Given 08/25/19 2000)    And  lidocaine (XYLOCAINE) 2 % viscous mouth solution 15 mL (15 mLs Oral Given 08/25/19 2000)  Initial Impression / Assessment and Plan / UC Course  I have reviewed the triage vital signs and the nursing notes.  Pertinent labs & imaging results that were available during my care of the patient were reviewed by me and considered in my medical decision making (see chart for details).     1.  Epigastric pain: GI cocktail given Carafate 3 times daily before meals and at bedtime Patient is advised to continue Protonix as prescribed  by the gastroenterologist Protonix was recently started by the gastroenterologist Old records reviewed Return precautions given CBC drawn.  Further recommendations to follow once CBC results are available. Final Clinical Impressions(s) / UC Diagnoses   Final diagnoses:  Epigastric pain   Discharge Instructions   None    ED Prescriptions    Medication Sig Dispense Auth. Provider   sucralfate (CARAFATE) 1 g tablet Take 1 tablet (1 g total) by mouth every 6 (six) hours. Make a slurry by slowly dissolving 1 tablet in 1 Tablespoon of distilled water before taking 120 tablet Shimshon Narula, Britta Mccreedy, MD   pantoprazole (PROTONIX) 40 MG tablet Take 1 tablet (40 mg total) by mouth 2 (two) times daily. 90 tablet Ishaan Villamar, Britta Mccreedy, MD     PDMP not reviewed this encounter.   Merrilee Jansky, MD 08/26/19 1728

## 2019-08-27 ENCOUNTER — Telehealth: Payer: Self-pay | Admitting: Internal Medicine

## 2019-08-27 NOTE — Telephone Encounter (Signed)
Spoke with patient. She stated that she has been in the ED several times in the past month for chest pain, dizziness and stomach discomfort. During the 2nd ED trip, she was advised to stop taking the Advair. She has been off of the Advair for about 2 weeks now. She does have an albuterol inhaler to use in the event of SOB.   She has an appt scheduled for May 13th with a spiro on the same day.   She wants to know if she needs to be seen sooner and if she needs to prescribed a different inhaler.   Dr. Celine Mans, please advise. Thanks!

## 2019-08-30 ENCOUNTER — Ambulatory Visit (INDEPENDENT_AMBULATORY_CARE_PROVIDER_SITE_OTHER): Payer: 59 | Admitting: Cardiology

## 2019-08-30 ENCOUNTER — Encounter: Payer: Self-pay | Admitting: Cardiology

## 2019-08-30 ENCOUNTER — Other Ambulatory Visit: Payer: Self-pay

## 2019-08-30 VITALS — BP 122/72 | HR 67 | Ht 64.0 in | Wt 246.0 lb

## 2019-08-30 DIAGNOSIS — R0789 Other chest pain: Secondary | ICD-10-CM | POA: Diagnosis not present

## 2019-08-30 DIAGNOSIS — R002 Palpitations: Secondary | ICD-10-CM | POA: Diagnosis not present

## 2019-08-30 MED ORDER — BUDESONIDE-FORMOTEROL FUMARATE 160-4.5 MCG/ACT IN AERO: 2.0000 | INHALATION_SPRAY | Freq: Two times a day (BID) | RESPIRATORY_TRACT | 6 refills | Status: DC

## 2019-08-30 MED ORDER — METOPROLOL TARTRATE 100 MG PO TABS: ORAL_TABLET | ORAL | 0 refills | Status: DC

## 2019-08-30 NOTE — Patient Instructions (Signed)
Medication Instructions:  NO CHANGE *If you need a refill on your cardiac medications before your next appointment, please call your pharmacy*   Lab Work: If you have labs (blood work) drawn today and your tests are completely normal, you will receive your results only by: [name] MyChart Message (if you have MyChart) OR . A paper copy in the mail If you have any lab test that is abnormal or we need to change your treatment, we will call you to review the results.   Testing/Procedures: Your cardiac CT will be scheduled at one of the below locations:   [facility] [address] [place], [place] [address] [phone]   If scheduled at [facility], please arrive at the [facility] main entrance of [facility] 30 minutes prior to test start time. Proceed to the [facility] Radiology Department (first floor) to check-in and test prep.  Please follow these instructions carefully (unless otherwise directed):  Hold all erectile dysfunction medications at least 3 days (72 hrs) prior to test.  On the Night Before the Test: . Be sure to Drink plenty of water. . Do not consume any caffeinated/decaffeinated beverages or chocolate 12 hours prior to your test. . Do not take any antihistamines 12 hours prior to your test. . If you take Metformin do not take 24 hours prior to test.  On the Day of the Test: . Drink plenty of water. Do not drink any water within one hour of the test. . Do not eat any food 4 hours prior to the test. . You may take your regular medications prior to the test.  . Take metoprolol (Lopressor) 100 MG two hours prior to test. . HOLD Furosemide/Hydrochlorothiazide morning of the test. . FEMALES- please wear underwire-free bra if available       After the Test: . Drink plenty of water. . After receiving IV contrast, you may experience a mild flushed feeling. This is normal. . On occasion, you may experience a mild rash up to 24 hours after  the test. This is not dangerous. If this occurs, you can take Benadryl 25 mg and increase your fluid intake. . If you experience trouble breathing, this can be serious. If it is severe call 911 IMMEDIATELY. If it is mild, please call our office. . If you take any of these medications: Glipizide/Metformin, Avandament, Glucavance, please do not take 48 hours after completing test unless otherwise instructed.   Once we have confirmed authorization from your insurance company, we will call you to set up a date and time for your test.   For non-scheduling related questions, please contact the cardiac imaging nurse navigator should you have any questions/concerns: Rockwell Alexandria, RN Navigator Cardiac Imaging [facility] [phone] office  For scheduling needs, including cancellations and rescheduling, please call [phone].      Follow-Up: At [facility], you and your health needs are our priority.  As part of our continuing mission to provide you with exceptional heart care, we have created designated Provider Care Teams.  These Care Teams include your primary Cardiologist (physician) and Advanced Practice Providers (APPs -  Physician Assistants and Nurse Practitioners) who all work together to provide you with the care you need, when you need it.  We recommend signing up for the patient portal called "MyChart".  Sign up information is provided on this After Visit Summary.  MyChart is used to connect with patients for Virtual Visits (Telemedicine).  Patients are  able to view lab/test results, encounter notes, upcoming appointments, etc.  Non-urgent messages can be sent to your provider as well.   To learn more about what you can do with MyChart, go to [email or web address].    Your next appointment:   12 week(s)  The format for your next appointment:   In Person  Provider:   Skeet Latch, MD

## 2019-08-30 NOTE — Telephone Encounter (Signed)
I'm not sure that her stomach discomfort would be related to advair but we can try an alternative inhaler. Please let her know I've sent symbicort to her pharmacy instead of advair. She can take this instead. I will see her back after PFTs as scheduled.

## 2019-08-30 NOTE — Telephone Encounter (Signed)
Called and spoke with pt letting her know the info stated by Dr. Celine Mans and that she has sent a different inhaler to pharmacy for her. Pt verbalized understanding. Nothing further needed.

## 2019-08-30 NOTE — Progress Notes (Signed)
HPI: Follow-up chest pain.  Patient is- followed by Dr. Duke Salvia.  Exercise treadmill September 2018 showed no ischemia.  Chest CT October 2018 showed no pulmonary embolus and no coronary calcification.  Patient seen with chest pain and palpitations in the emergency room April 11.  Electrocardiogram showed no ST changes.  Troponins were normal.  D-dimer was normal.  Liver functions normal.  Hemoglobin 10.9.  Patient complains of dyspnea on exertion.  No orthopnea or PND.  Occasional pedal edema.  She has episodes of heart racing that are worse at night.  There is associated dizziness and chest pain.  Her chest pain is described as a burning pressure.  She denies exertional chest pain.  No syncope.  Current Outpatient Medications  Medication Sig Dispense Refill  . pantoprazole (PROTONIX) 40 MG tablet Take 1 tablet (40 mg total) by mouth 2 (two) times daily. 90 tablet 3  . sucralfate (CARAFATE) 1 g tablet Take 1 tablet (1 g total) by mouth every 6 (six) hours. Make a slurry by slowly dissolving 1 tablet in 1 Tablespoon of distilled water before taking 120 tablet 2   No current facility-administered medications for this visit.     Past Medical History:  Diagnosis Date  . Allergy   . Anemia   . Angio-edema   . Anxiety   . Arthritis   . Constipation   . GERD (gastroesophageal reflux disease)   . Hx of endometriosis   . Insomnia   . Macromastia 07/2011  . Migraine   . Urticaria     Past Surgical History:  Procedure Laterality Date  . 24 HOUR PH STUDY N/A 01/12/2018   Procedure: 24 HOUR PH STUDY;  Surgeon: Napoleon Form, MD;  Location: [facility];  Service: Endoscopy;  Laterality: N/A;  . ABDOMINAL HYSTERECTOMY  12/12/2003  . ADENOIDECTOMY  05/2011  . ANKLE ARTHROSCOPY  12/29/2007   right; with extensive debridement  . BLADDER NECK RECONSTRUCTION  01/14/2011   Procedure: BLADDER NECK REPAIR;  Surgeon: Reva Bores, MD;  Location: [facility];  Service: Gynecology;  Laterality: N/A;   Laparoscopic Repair of Incidental Cystotomy  . BREAST REDUCTION SURGERY  08/05/2011   Procedure: MAMMARY REDUCTION  (BREAST);  Surgeon: Louisa Second, MD;  Location: [facility];  Service: Plastics;  Laterality: Bilateral;  bilateral  . CESAREAN SECTION  12/29/2000; 1994  . DILATION AND CURETTAGE OF UTERUS  07/08/2003   open laparoscopy  . ESOPHAGEAL MANOMETRY N/A 01/12/2018   Procedure: ESOPHAGEAL MANOMETRY (EM);  Surgeon: Napoleon Form, MD;  Location: [facility];  Service: Endoscopy;  Laterality: N/A;  . GIVENS CAPSULE STUDY N/A 06/23/2017   Procedure: GIVENS CAPSULE STUDY;  Surgeon: Benancio Deeds, MD;  Location[facility];  Service: Gastroenterology;  Laterality: N/A;  . LAPAROSCOPIC SALPINGOOPHERECTOMY  01/14/2011   left  . PH IMPEDANCE STUDY N/A 01/12/2018   Procedure: PH IMPEDANCE STUDY;  Surgeon: Napoleon Form, MD;  Location: [facility];  Service: Endoscopy;  Laterality: N/A;  . REPAIR PERONEAL TENDONS ANKLE  01/03/2010   repair right subluxing peroneal tendons  . RESECTION DISTAL CLAVICAL Right 09/25/2017   Procedure: RESECTION DISTAL CLAVICAL;  Surgeon: Bjorn Pippin, MD;  Location: [facility];  Service: Orthopedics;  Laterality: Right;  . RIGHT OOPHORECTOMY     with lysis of adhesions  . SHOULDER ACROMIOPLASTY Right 09/25/2017   Procedure: SHOULDER ACROMIOPLASTY;  Surgeon: Bjorn Pippin, MD;  Location: [facility];  Service: Orthopedics;  Laterality: Right;  . SHOULDER ARTHROSCOPY WITH DEBRIDEMENT AND BICEP TENDON REPAIR Right 09/25/2017   Procedure: SHOULDER ARTHROSCOPY WITH DEBRIDEMENT AND BICEP TENDON REPAIR;  Surgeon: Hiram Gash, MD;  Location: [facility];  Service: Orthopedics;  Laterality: Right;  . SHOULDER ARTHROSCOPY WITH ROTATOR CUFF REPAIR Right 09/25/2017   Procedure: SHOULDER ARTHROSCOPY WITH ROTATOR CUFF REPAIR;  Surgeon: Hiram Gash, MD;  Location: [facility];  Service:  Orthopedics;  Laterality: Right;  . TONSILLECTOMY  as a child  . TUBAL LIGATION  12/29/2000    Social History   Socioeconomic History  . Marital status: Divorced    Spouse name: Not on file  . Number of children: 2  . Years of education: 12th  . Highest education level: Not on file  Occupational History    Employer: OTHER    Comment: [employer]  Tobacco Use  . Smoking status: Never Smoker  . Smokeless tobacco: Never Used  Substance and Sexual Activity  . Alcohol use: Yes    Comment: occasional   . Drug use: No  . Sexual activity: Not on file  Other Topics Concern  . 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 Difficulty of Paying Living Expenses:   Food Insecurity:   . Worried About Charity fundraiser in the Last Year:   . Arboriculturist in the Last Year:   Transportation Needs:   . Film/video editor (Medical):   [name] Lack of Transportation (Non-Medical):   Physical Activity:   . Days of Exercise per Week:   . Minutes of Exercise per Session:   Stress:   . Feeling of Stress :   Social Connections:   . Frequency of Communication with Friends and Family:   . Frequency of Social Gatherings with Friends and Family:   . Attends Religious Services:   . Active Member of Clubs or Organizations:   . Attends Archivist Meetings:   [name] Marital Status:   Intimate Partner Violence:   . Fear of Current or Ex-Partner:   . Emotionally Abused:   [name] Physically Abused:   . Sexually Abused:     Family History  Problem Relation Age of Onset  . Diabetes Mother   . Hypertension Mother   . Thyroid disease Sister   . Heart attack Maternal Grandmother   . Stroke Maternal Grandfather   . Asthma Son   . Asthma Daughter   . Colon cancer Neg Hx   . Colon polyps Neg Hx   .  Esophageal cancer Neg Hx   . Rectal cancer Neg Hx   . Stomach cancer Neg Hx     ROS: no fevers or chills, productive cough, hemoptysis, dysphasia, odynophagia, melena, hematochezia, dysuria, hematuria, rash, seizure activity, orthopnea, PND, pedal edema, claudication. Remaining systems are negative.  Physical Exam: Well-developed well-nourished in no acute distress.  Skin is warm and dry.  HEENT is normal.  Neck is supple.  Chest is clear to auscultation with normal expansion.  Cardiovascular exam is regular rate and rhythm.  Abdominal exam nontender or distended. No masses palpated. Extremities show no edema. neuro grossly intact  ECG-August 15, 2019-sinus rhythm with no ST changes.  Personally reviewed  A/P  1 chest pain-patient has a long history of atypical chest pain.  Recent evaluation in the emergency room showed normal troponins, no ST changes and normal D-dimer.  I will arrange a cardiac CTA to further assess.  2 palpitations-etiology unclear.  Electrocardiogram showed normal sinus rhythm.  She now has an apple watch 6.  I have asked her to record strips when she is having symptoms and we will review and treat as needed.  Olga Millers, MD

## 2019-08-31 ENCOUNTER — Ambulatory Visit: Payer: 59 | Admitting: Medical

## 2019-09-01 ENCOUNTER — Telehealth: Payer: Self-pay | Admitting: Internal Medicine

## 2019-09-01 NOTE — Telephone Encounter (Signed)
Left message for patient to call back  

## 2019-09-02 ENCOUNTER — Ambulatory Visit (HOSPITAL_COMMUNITY)
Admission: RE | Admit: 2019-09-02 | Discharge: 2019-09-02 | Disposition: A | Discharge status: Home / Self Care | Payer: 59 | Source: Ambulatory Visit | Attending: Gastroenterology | Admitting: Gastroenterology

## 2019-09-02 ENCOUNTER — Other Ambulatory Visit: Payer: Self-pay

## 2019-09-02 DIAGNOSIS — D509 Iron deficiency anemia, unspecified: Secondary | ICD-10-CM | POA: Insufficient documentation

## 2019-09-02 DIAGNOSIS — R1310 Dysphagia, unspecified: Secondary | ICD-10-CM | POA: Diagnosis present

## 2019-09-02 DIAGNOSIS — R002 Palpitations: Secondary | ICD-10-CM | POA: Diagnosis present

## 2019-09-02 DIAGNOSIS — R1110 Vomiting, unspecified: Secondary | ICD-10-CM | POA: Diagnosis present

## 2019-09-02 NOTE — Telephone Encounter (Signed)
Called and spoke patient she stated budesonide is expensive. It was $50. Wondering if there is another alternative?  Dr. Celine Mans please advise if you would like to switch to something else

## 2019-09-03 NOTE — Telephone Encounter (Signed)
Have already tried advair and symbicort. I am referring to pharmacy to see if they have any thoughts as I'm not sure what options she has with her coverage.

## 2019-09-03 NOTE — Telephone Encounter (Signed)
Called [facility], patient's quoted $50 copay for Budesonide is for 3 months supply. Patient can order 1 inhaler at a time if she prefers.  Ran test claims for Advair and Breo- copay for 1 month supply is $20.  Elwin Sleight, Airduo, and Symbicort brand are non-formulary.

## 2019-09-03 NOTE — Telephone Encounter (Signed)
Would let patient know budesonide $50 for 3 month supply is probably her best bet unless she wants to try advair again.

## 2019-09-03 NOTE — Telephone Encounter (Signed)
Called and spoke with patient and she said that they did not tell her that the $50 for a 3 month supply they just told her it was $50. She is ok with the $50 copay since it is for a 3 month supply and would like to stay with that. I will forward this message to DR. Celine Mans just as an Financial planner. Nothing further needed at this time.

## 2019-09-17 ENCOUNTER — Ambulatory Visit: Payer: Self-pay

## 2019-09-17 ENCOUNTER — Telehealth: Payer: Self-pay | Admitting: Cardiology

## 2019-09-17 ENCOUNTER — Telehealth: Payer: Self-pay

## 2019-09-17 ENCOUNTER — Other Ambulatory Visit: Payer: Self-pay

## 2019-09-17 DIAGNOSIS — R079 Chest pain, unspecified: Secondary | ICD-10-CM

## 2019-09-17 DIAGNOSIS — E669 Obesity, unspecified: Secondary | ICD-10-CM

## 2019-09-17 DIAGNOSIS — G8929 Other chronic pain: Secondary | ICD-10-CM

## 2019-09-17 DIAGNOSIS — M25511 Pain in right shoulder: Secondary | ICD-10-CM

## 2019-09-17 NOTE — Telephone Encounter (Signed)
Enrique Sack from [facility] calling stating a CT and MRI were ordered for the patient, but radiology has a back log. She states the patient is concerned, because she is still having rapid HR and would like to know if she could be able be put on high priority or have it done in another location sooner.

## 2019-09-17 NOTE — Telephone Encounter (Signed)
Left message for Enrique Sack, chest pain was considered atypical and waiting for the next CTA is fine and that [facility] is the only placed in our system doing those scans. Also, advised for the patient to use her apple 6 watch to record her rhythm so we can see what she is having. Explained if patient still having symptoms she needs to give [place] a call.

## 2019-09-17 NOTE — Chronic Care Management (AMB) (Signed)
  Chronic Care Management   Outreach Note  09/17/2019 Name: [name] MRN: [id number] DOB: 1976/01/29  Referred by: Patients Health Plan Reason for referral : Care Coordination   An unsuccessful telephone outreach was attempted today. The patient was referred to the case management team for assistance with care management and care coordination.   Follow Up Plan: A HIPPA compliant phone message was left for the patient providing contact information and requesting a return call.  The care management team will reach out to the patient again over the next 10 days.   Bevelyn Ngo, BSW, CDP Social Worker, Certified Dementia Practitioner [facility] Care Management [phone]

## 2019-09-17 NOTE — Chronic Care Management (AMB) (Signed)
Care Management   Initial Visit Note  09/17/2019 Name: [name] MRN: [id number] DOB: 1975-07-30  Referred by: [name], FNP Reason for referral : Chronic Care Management (RQ CC Case Collaboration )   [name] is a 44 y.o. year old female who is a primary care patient of [name], FNP. The care management team was consulted for assistance with chronic disease management and care coordination needs related to Impaired Cardiac dysfunction (palpitations)   Review of patient status, including review of consultants reports, relevant laboratory and other test results, and collaboration with appropriate care team members and the patient's provider was performed as part of comprehensive patient evaluation and provision of chronic care management services.    I initiated and established the plan of care for [name] during one on one collaboration with my clinical care management colleague Bevelyn Ngo BSW who is also engaged with this patient to address social work needs.   Outpatient Encounter Medications as of 09/17/2019  Medication Sig  . budesonide-formoterol (SYMBICORT) 160-4.5 MCG/ACT inhaler Inhale 2 puffs into the lungs in the morning and at bedtime.  . metoprolol tartrate (LOPRESSOR) 100 MG tablet TAKE 2 HOURS PRIOR TO CT SCAN  . pantoprazole (PROTONIX) 40 MG tablet Take 1 tablet (40 mg total) by mouth 2 (two) times daily.  . sucralfate (CARAFATE) 1 g tablet Take 1 tablet (1 g total) by mouth every 6 (six) hours. Make a slurry by slowly dissolving 1 tablet in 1 Tablespoon of distilled water before taking  . [DISCONTINUED] albuterol (VENTOLIN HFA) 108 (90 Base) MCG/ACT inhaler Inhale 2 puffs into the lungs every 6 (six) hours as needed for wheezing or shortness of breath.  . [DISCONTINUED] cetirizine (ZYRTEC) 10 MG tablet Take 1 tablet (10 mg total) by mouth daily.  . [DISCONTINUED] ferrous sulfate 325 (65 FE) MG EC tablet Take 1 tablet (325 mg total) by  mouth 2 (two) times daily with a meal.  . [DISCONTINUED] Fluticasone-Salmeterol (ADVAIR DISKUS) 250-50 MCG/DOSE AEPB Inhale 1 puff into the lungs in the morning and at bedtime.  . [DISCONTINUED] loratadine (CLARITIN) 10 MG tablet Take 1 tablet (10 mg total) by mouth daily.   No facility-administered encounter medications on file as of 09/17/2019.    Objective:  Lab Results  Component Value Date   HGBA1C 5.7 (H) 06/17/2019   HGBA1C 5.1 12/24/2017   HGBA1C 5.1 06/16/2017   Lab Results  Component Value Date   LDLCALC 108 (H) 06/17/2019   CREATININE 0.94 08/15/2019   BP Readings from Last 3 Encounters:  08/30/19 122/72  08/25/19 129/79  08/19/19 132/70   Goals Addressed    . Assist with Care Coordination needs and Chronic Care Management       CARE PLAN ENTRY (see longtitudinal plan of care for additional care plan information)   Current Barriers:  . Chronic Disease Management support, education, and care coordination needs related to  Impaired Cardiac dysfunction (palpitations)   Case Manager Clinical Goal(s):  [name] Over the next 30 days, patient will work with the CCM team to address needs related to Care Coordination in patient with Impaired Cardiac dysfunction (palpitations)   Interventions:  . Collaborated with BSW to initiate plan of care to address needs related to  completion of Cardiac CT  in patient with Impaired Cardiac dysfunction (palpitations)   Patient Self Care Activities:  . Self administers medications as prescribed . Attends all scheduled provider appointments . Calls pharmacy for medication refills . Calls provider office for  new concerns or questions  Initial goal documentation        Telephone follow up appointment with care management team member scheduled for: 09/20/19  [name], RN, BSN, CCM Care Management Coordinator [place] [facility]  Direct Phone: [phone]

## 2019-09-18 NOTE — Patient Instructions (Signed)
Visit Information  Goals Addressed            This Visit's Progress   . Collaborate with RN Care Manager to perform approrpiate assessments to determine care management and care coordination needs       CARE PLAN ENTRY (see longitudinal plan of care for additional care plan information)  Current Barriers:  . Four ED visits over the last 30 days . Patient experiencing heart palpitations without known cause . Limited knowledge of how to react to symptoms of heart palpitations due to lack of clinical diagnosis   Social Work Clinical Goal(s):  [name] Over the next 20 days the patient will receive imaging studies ordered by [name] on 08/30/19 . Over the next 60 days the patient will work with embedded care management team to establish an individualized plan of care related to the management of patients ongoing medical concerns  SW Interventions: Completed 09/17/19 . Inter-disciplinary care team collaboration (see longitudinal plan of care) . Inbound call received from the patient in response to voice message left by SW . Patient interviewed and appropriate assessments performed o Determined the patient has been seen in the ED x 4 over the last 30 days o The patient reports seen by Cardiologist, Dr [name] on 4/26 whom ordered an MRI and CAT scan to investigate cause of patient heart palpitations . Determined the patient has yet to be contacted by imaging location to have tests performed o The patient reports she has been in contact with her health plan whom reports they have yet to receive a request for imaging studies o Patient reports she has contacted Radiology department in which referral was sent whom reports a backlog of orders . Discussed the patient continues to have symptoms associated with atypical chest pain o The patient reports ongoing SOB and dizziness o The patient is awoken by rapid heart rate on occasion with averages of 160-180 BPM when this occurs o The patient reports  she tries to relax while episode occurs o The patient stated "I did everything I was supposed to do but nothing is happening" . Advised the patient SW would outreach clinical team to address barriers related to imaging studies . Encouraged the patient to contact her cardiology team and primary care provider with ongoing symptoms as needed . Provided direct contact number to SW for the patient to contact SW directly . Collaboration with Marja Kays, RN team lead and Delsa Sale, embedded RN care manager regarding patient barriers to obtain imaging studies . Collaboration with [name] 's office to report ongoing symptoms reported by the patient as well as barriers associated with ordered imaging studies o Requested follow up to determine if orders should be marked urgent or if the patient could receive imaging studies at a different location . Scheduled follow up call to the patient over the next 5 days  Patient Self Care Activities:  . Patient verbalizes understanding of plan to work with embedded care management team to address ongoing care coordination needs . Self administers medications as prescribed . Performs ADL's independently . Performs IADL's independently . Calls provider office for new concerns or questions  Initial goal documentation        Ms. Prezioso was given information about Care Management services today including:  1. Care Management services include personalized support from designated clinical staff supervised by her physician, including individualized plan of care and coordination with other care providers 2. 24/7 contact phone numbers for assistance for urgent and routine care  needs. 3. The patient may stop CCM services at any time (effective at the end of the month) by phone call to the office staff.  Patient agreed to services and verbal consent obtained.   The patient verbalized understanding of instructions provided today and declined a print copy of  patient instruction materials.   The care management team will reach out to the patient again over the next 5 days.   Daneen Schick, BSW, CDP Social Worker, Certified Dementia Practitioner [place] [facility] [place] [facility] [phone]

## 2019-09-18 NOTE — Chronic Care Management (AMB) (Signed)
Care Management    09/17/19 Name: [name] MRN: [id number] DOB: [birth year]  Referred by: Patients Health Plan Reason for referral : Care Coordination   [name] is a 44 y.o. year old female who is a primary care patient of [name], [place]. The care management team was consulted for assistance with chronic disease management and care coordination needs.   Review of patient status, including review of consultants reports, relevant laboratory and other test results, and collaboration with appropriate care team members and the patient's provider was performed as part of comprehensive patient evaluation and provision of care management services.    SDOH (Social Determinants of Health) assessments performed: Yes, no acute needs at this time.     Goals Addressed            This Visit's Progress   . Collaborate with RN Care Manager to perform approrpiate assessments to determine care management and care coordination needs       CARE PLAN ENTRY (see longitudinal plan of care for additional care plan information)  Current Barriers:  . Four ED visits over the last 30 days . Patient experiencing heart palpitations without known cause . Limited knowledge of how to react to symptoms of heart palpitations due to lack of clinical diagnosis   Social Work Clinical Goal(s):  [name] Over the next 20 days the patient will receive imaging studies ordered by [name] on 08/30/19 . Over the next 60 days the patient will work with embedded care management team to establish an individualized plan of care related to the management of patients ongoing medical concerns  SW Interventions: Completed 09/17/19 . Inter-disciplinary care team collaboration (see longitudinal plan of care) . Inbound call received from the patient in response to voice message left by SW . Patient interviewed and appropriate assessments performed o Determined the patient has been seen in the ED x 4 over the last 30  days o The patient reports seen by Cardiologist, Dr [name] on 4/26 whom ordered an MRI and CAT scan to investigate cause of patient heart palpitations . Determined the patient has yet to be contacted by imaging location to have tests performed o The patient reports she has been in contact with her health plan whom reports they have yet to receive a request for imaging studies o Patient reports she has contacted Radiology department in which referral was sent whom reports a backlog of orders . Discussed the patient continues to have symptoms associated with atypical chest pain o The patient reports ongoing SOB and dizziness o The patient is awoken by rapid heart rate on occasion with averages of 160-180 BPM when this occurs o The patient reports she tries to relax while episode occurs o The patient stated "I did everything I was supposed to do but nothing is happening" . Advised the patient SW would outreach clinical team to address barriers related to imaging studies . Encouraged the patient to contact her cardiology team and primary care provider with ongoing symptoms as needed . Provided direct contact number to SW for the patient to contact SW directly . Collaboration with Sheila Shy, RN team lead and [name], embedded RN care manager regarding patient barriers to obtain imaging studies . Collaboration with [name] 's office to report ongoing symptoms reported by the patient as well as barriers associated with ordered imaging studies o Requested follow up to determine if orders should be marked urgent or if the patient could receive imaging studies at a  different location . Scheduled follow up call to the patient over the next 5 days  Patient Self Care Activities:  . Patient verbalizes understanding of plan to work with embedded care management team to address ongoing care coordination needs . Self administers medications as prescribed . Performs ADL's independently . Performs  IADL's independently . Calls provider office for new concerns or questions  Initial goal documentation          [name] was given information about Care Management services today including:  1. Care Management services includes personalized support from designated clinical staff supervised by her physician, including individualized plan of care and coordination with other care providers 2. 24/7 contact phone numbers for assistance for urgent and routine care needs. 3. The patient may stop case management services at any time by phone call to the office staff.  Patient agreed to services and verbal consent obtained.    Follow up plan: The care management team will reach out to the patient again over the next 5 days.   [name], BSW, CDP Social Worker, Certified Dementia Practitioner [facility] Care Management [phone]

## 2019-09-20 ENCOUNTER — Ambulatory Visit: Payer: Self-pay

## 2019-09-20 ENCOUNTER — Telehealth: Payer: Self-pay

## 2019-09-20 DIAGNOSIS — R079 Chest pain, unspecified: Secondary | ICD-10-CM

## 2019-09-20 DIAGNOSIS — R0600 Dyspnea, unspecified: Secondary | ICD-10-CM

## 2019-09-20 DIAGNOSIS — R0609 Other forms of dyspnea: Secondary | ICD-10-CM

## 2019-09-20 NOTE — Chronic Care Management (AMB) (Signed)
Care Management   Follow Up Note   09/20/2019 Name: [name] MRN: [id number] DOB: [birth year]  Referred by: [name], FNP Reason for referral : Care Coordination   [name] is a 44 y.o. year old female who is a primary care patient of [name], [place]. The care management team was consulted for assistance with care management and care coordination needs.    Review of patient status, including review of consultants reports, relevant laboratory and other test results, and collaboration with appropriate care team members and the patient's provider was performed as part of comprehensive patient evaluation and provision of chronic care management services.    SDOH (Social Determinants of Health) assessments performed: No  Goals Addressed            This Visit's Progress   . Collaborate with RN Care Manager to perform approrpiate assessments to determine care management and care coordination needs       CARE PLAN ENTRY (see longitudinal plan of care for additional care plan information)  Current Barriers:  . Four ED visits over the last 30 days . Patient experiencing heart palpitations without known cause . Limited knowledge of how to react to symptoms of heart palpitations due to lack of clinical diagnosis   Social Work Clinical Goal(s):  [name] Over the next 20 days the patient will receive imaging studies ordered by [name] on 08/30/19 . Over the next 60 days the patient will work with embedded care management team to establish an individualized plan of care related to the management of patients ongoing medical concerns  SW Interventions: Completed 09/20/19 . Voice message received from [facility] with [name] office indicating [name] felt with atypical chest pain the patient would be okay to wait for radiology to perform test as originally ordered o Requested the patient contact [place] with new onset of symptoms o Requested the patient use Apple 6 watch  EKG feature to monitor heart rate when palpitations occur and report back to provider office . Unsuccessful outbound call placed to the patient to review instructions from [employer] . Collaboration with [name], [place] regarding patient instructions  Completed 09/17/19 . Inter-disciplinary care team collaboration (see longitudinal plan of care) . Inbound call received from the patient in response to voice message left by SW . Patient interviewed and appropriate assessments performed o Determined the patient has been seen in the ED x 4 over the last 30 days o The patient reports seen by Cardiologist, Dr [name] on 4/26 whom ordered an MRI and CAT scan to investigate cause of patient heart palpitations . Determined the patient has yet to be contacted by imaging location to have tests performed o The patient reports she has been in contact with her health plan whom reports they have yet to receive a request for imaging studies o Patient reports she has contacted Radiology department in which referral was sent whom reports a backlog of orders . Discussed the patient continues to have symptoms associated with atypical chest pain o The patient reports ongoing SOB and dizziness o The patient is awoken by rapid heart rate on occasion with averages of 160-180 BPM when this occurs o The patient reports she tries to relax while episode occurs o The patient stated "I did everything I was supposed to do but nothing is happening" . Advised the patient SW would outreach clinical team to address barriers related to imaging studies . Encouraged the patient to contact her cardiology team and  primary care provider with ongoing symptoms as needed . Provided direct contact number to SW for the patient to contact SW directly . Collaboration with Marja Kays, RN team lead and Delsa Sale, embedded RN care manager regarding patient barriers to obtain imaging studies . Collaboration with [name] 's  office to report ongoing symptoms reported by the patient as well as barriers associated with ordered imaging studies o Requested follow up to determine if orders should be marked urgent or if the patient could receive imaging studies at a different location . Scheduled follow up call to the patient over the next 5 days  Patient Self Care Activities:  . Patient verbalizes understanding of plan to work with embedded care management team to address ongoing care coordination needs . Self administers medications as prescribed . Performs ADL's independently . Performs IADL's independently . Calls provider office for new concerns or questions  Please see past updates related to this goal by clicking on the "Past Updates" button in the selected goal          A HIPPA compliant phone message was left for the patient providing contact information and requesting a return call.  The care management team will reach out to the patient again over the next 10 days.   Bevelyn Ngo, BSW, CDP Social Worker, Certified Dementia Practitioner [facility] Care Management [phone]

## 2019-09-20 NOTE — Patient Instructions (Signed)
Visit Information  Goals Addressed            This Visit's Progress   . Collaborate with RN Care Manager to perform approrpiate assessments to determine care management and care coordination needs       CARE PLAN ENTRY (see longitudinal plan of care for additional care plan information)  Current Barriers:  . Four ED visits over the last 30 days . Patient experiencing heart palpitations without known cause . Limited knowledge of how to react to symptoms of heart palpitations due to lack of clinical diagnosis   Social Work Clinical Goal(s):  [name] Over the next 20 days the patient will receive imaging studies ordered by [name] on 08/30/19 . Over the next 60 days the patient will work with embedded care management team to establish an individualized plan of care related to the management of patients ongoing medical concerns  SW Interventions: Completed 09/20/19 . Voice message received from [facility] with [name] office indicating [name] felt with atypical chest pain the patient would be okay to wait for radiology to perform test as originally ordered o Requested the patient contact [facility] with new onset of symptoms o Requested the patient use Apple 6 watch EKG feature to monitor heart rate when palpitations occur and report back to provider office . Unsuccessful outbound call placed to the patient to review instructions from [employer] . Collaboration with Delsa Sale, RN Care Manager regarding patient instructions  Completed 09/17/19 . Inter-disciplinary care team collaboration (see longitudinal plan of care) . Inbound call received from the patient in response to voice message left by SW . Patient interviewed and appropriate assessments performed o Determined the patient has been seen in the ED x 4 over the last 30 days o The patient reports seen by Cardiologist, Dr [name] on 4/26 whom ordered an MRI and CAT scan to investigate cause of patient heart palpitations . Determined  the patient has yet to be contacted by imaging location to have tests performed o The patient reports she has been in contact with her health plan whom reports they have yet to receive a request for imaging studies o Patient reports she has contacted Radiology department in which referral was sent whom reports a backlog of orders . Discussed the patient continues to have symptoms associated with atypical chest pain o The patient reports ongoing SOB and dizziness o The patient is awoken by rapid heart rate on occasion with averages of 160-180 BPM when this occurs o The patient reports she tries to relax while episode occurs o The patient stated "I did everything I was supposed to do but nothing is happening" . Advised the patient SW would outreach clinical team to address barriers related to imaging studies . Encouraged the patient to contact her cardiology team and primary care provider with ongoing symptoms as needed . Provided direct contact number to SW for the patient to contact SW directly . Collaboration with Marja Kays, RN team lead and Delsa Sale, embedded RN care manager regarding patient barriers to obtain imaging studies . Collaboration with [name] 's office to report ongoing symptoms reported by the patient as well as barriers associated with ordered imaging studies o Requested follow up to determine if orders should be marked urgent or if the patient could receive imaging studies at a different location . Scheduled follow up call to the patient over the next 5 days  Patient Self Care Activities:  . Patient verbalizes understanding of plan to work with  embedded care management team to address ongoing care coordination needs . Self administers medications as prescribed . Performs ADL's independently . Performs IADL's independently . Calls provider office for new concerns or questions  Please see past updates related to this goal by clicking on the "Past Updates" button in  the selected goal         The care management team will reach out to the patient again over the next 10 days.   Daneen Schick, BSW, CDP Social Worker, Certified Dementia Practitioner [place] [facility] [place] Management [phone]

## 2019-09-21 ENCOUNTER — Other Ambulatory Visit (HOSPITAL_COMMUNITY)
Admission: RE | Admit: 2019-09-21 | Discharge: 2019-09-21 | Disposition: A | Discharge status: Home / Self Care | Payer: 59 | Source: Ambulatory Visit | Attending: Internal Medicine | Admitting: Internal Medicine

## 2019-09-21 ENCOUNTER — Ambulatory Visit: Payer: Self-pay

## 2019-09-21 DIAGNOSIS — Z20822 Contact with and (suspected) exposure to covid-19: Secondary | ICD-10-CM | POA: Diagnosis not present

## 2019-09-21 DIAGNOSIS — Z01812 Encounter for preprocedural laboratory examination: Secondary | ICD-10-CM | POA: Diagnosis present

## 2019-09-21 DIAGNOSIS — E669 Obesity, unspecified: Secondary | ICD-10-CM

## 2019-09-21 DIAGNOSIS — R0789 Other chest pain: Secondary | ICD-10-CM

## 2019-09-21 DIAGNOSIS — R0600 Dyspnea, unspecified: Secondary | ICD-10-CM

## 2019-09-21 DIAGNOSIS — R0609 Other forms of dyspnea: Secondary | ICD-10-CM

## 2019-09-21 LAB — SARS CORONAVIRUS 2 (TAT 6-24 HRS): SARS Coronavirus 2: NEGATIVE

## 2019-09-21 NOTE — Patient Instructions (Signed)
Social Worker Visit Information  Goals we discussed today:  Goals Addressed            This Visit's Progress   . Collaborate with RN Care Manager to perform approrpiate assessments to determine care management and care coordination needs       CARE PLAN ENTRY (see longitudinal plan of care for additional care plan information)  Current Barriers:  . Four ED visits over the last 30 days . Patient experiencing heart palpitations without known cause . Limited knowledge of how to react to symptoms of heart palpitations due to lack of clinical diagnosis   Social Work Clinical Goal(s):  [name] Over the next 20 days the patient will receive imaging studies ordered by [name] on 08/30/19 . Over the next 60 days the patient will work with embedded care management team to establish an individualized plan of care related to the management of patients ongoing medical concerns  SW Interventions: Completed 09/21/19 . Inbound call received from the patient in response to voice message left by SW . Reviewed instructions from [name] office regarding importance of contacting [place] with new onset or worsening of symptoms . Confirmed the patient is knowledgeable of how to access Apple 6 EKG feature . Scheduled follow up over the next two weeks  Completed 09/20/19 . Voice message received from [facility] with [name] office indicating [name] felt with atypical chest pain the patient would be okay to wait for radiology to perform test as originally ordered o Requested the patient contact [place] with new onset of symptoms o Requested the patient use Apple 6 watch EKG feature to monitor heart rate when palpitations occur and report back to provider office . Unsuccessful outbound call placed to the patient to review instructions from [employer] . Collaboration with [name], [place] regarding patient instructions  Completed 09/17/19 . Inter-disciplinary care team collaboration (see  longitudinal plan of care) . Inbound call received from the patient in response to voice message left by SW . Patient interviewed and appropriate assessments performed o Determined the patient has been seen in the ED x 4 over the last 30 days o The patient reports seen by Cardiologist, Dr [name] on 4/26 whom ordered an MRI and CAT scan to investigate cause of patient heart palpitations . Determined the patient has yet to be contacted by imaging location to have tests performed o The patient reports she has been in contact with her health plan whom reports they have yet to receive a request for imaging studies o Patient reports she has contacted Radiology department in which referral was sent whom reports a backlog of orders . Discussed the patient continues to have symptoms associated with atypical chest pain o The patient reports ongoing SOB and dizziness o The patient is awoken by rapid heart rate on occasion with averages of 160-180 BPM when this occurs o The patient reports she tries to relax while episode occurs o The patient stated "I did everything I was supposed to do but nothing is happening" . Advised the patient SW would outreach clinical team to address barriers related to imaging studies . Encouraged the patient to contact her cardiology team and primary care provider with ongoing symptoms as needed . Provided direct contact number to SW for the patient to contact SW directly . Collaboration with [name], RN team lead and [name], embedded RN care manager regarding patient barriers to obtain imaging studies . Collaboration with [name] 's office to report  ongoing symptoms reported by the patient as well as barriers associated with ordered imaging studies o Requested follow up to determine if orders should be marked urgent or if the patient could receive imaging studies at a different location . Scheduled follow up call to the patient over the next 5 days  Patient  Self Care Activities:  . Patient verbalizes understanding of plan to work with embedded care management team to address ongoing care coordination needs . Self administers medications as prescribed . Performs ADL's independently . Performs IADL's independently . Calls provider office for new concerns or questions  Please see past updates related to this goal by clicking on the "Past Updates" button in the selected goal           Follow Up Plan: SW will follow up with patient by phone over the next two weeks,   Bevelyn Ngo, BSW, CDP Social Worker, Certified Dementia Practitioner [facility] Care Management [phone]

## 2019-09-21 NOTE — Chronic Care Management (AMB) (Signed)
Chronic Care Management    Social Work Follow Up Note  09/21/2019 Name: [name] MRN: [id number] DOB: 1976/04/10  [name] is a 44 y.o. year old female who is a primary care patient of [name], [place]. The CCM team was consulted for assistance with care coordination.   Review of patient status, including review of consultants reports, other relevant assessments, and collaboration with appropriate care team members and the patient's provider was performed as part of comprehensive patient evaluation and provision of chronic care management services.    SDOH (Social Determinants of Health) assessments performed: No    Outpatient Encounter Medications as of 09/21/2019  Medication Sig  . budesonide-formoterol (SYMBICORT) 160-4.5 MCG/ACT inhaler Inhale 2 puffs into the lungs in the morning and at bedtime.  . metoprolol tartrate (LOPRESSOR) 100 MG tablet TAKE 2 HOURS PRIOR TO CT SCAN  . pantoprazole (PROTONIX) 40 MG tablet Take 1 tablet (40 mg total) by mouth 2 (two) times daily.  . sucralfate (CARAFATE) 1 g tablet Take 1 tablet (1 g total) by mouth every 6 (six) hours. Make a slurry by slowly dissolving 1 tablet in 1 Tablespoon of distilled water before taking  . [DISCONTINUED] albuterol (VENTOLIN HFA) 108 (90 Base) MCG/ACT inhaler Inhale 2 puffs into the lungs every 6 (six) hours as needed for wheezing or shortness of breath.  . [DISCONTINUED] cetirizine (ZYRTEC) 10 MG tablet Take 1 tablet (10 mg total) by mouth daily.  . [DISCONTINUED] ferrous sulfate 325 (65 FE) MG EC tablet Take 1 tablet (325 mg total) by mouth 2 (two) times daily with a meal.  . [DISCONTINUED] Fluticasone-Salmeterol (ADVAIR DISKUS) 250-50 MCG/DOSE AEPB Inhale 1 puff into the lungs in the morning and at bedtime.  . [DISCONTINUED] loratadine (CLARITIN) 10 MG tablet Take 1 tablet (10 mg total) by mouth daily.   No facility-administered encounter medications on file as of 09/21/2019.     Goals Addressed            This Visit's Progress   . Collaborate with RN Care Manager to perform approrpiate assessments to determine care management and care coordination needs       CARE PLAN ENTRY (see longitudinal plan of care for additional care plan information)  Current Barriers:  . Four ED visits over the last 30 days . Patient experiencing heart palpitations without known cause . Limited knowledge of how to react to symptoms of heart palpitations due to lack of clinical diagnosis   Social Work Clinical Goal(s):  [name] Over the next 20 days the patient will receive imaging studies ordered by [name] on 08/30/19 . Over the next 60 days the patient will work with embedded care management team to establish an individualized plan of care related to the management of patients ongoing medical concerns  SW Interventions: Completed 09/21/19 . Inbound call received from the patient in response to voice message left by SW . Reviewed instructions from [name] office regarding importance of contacting [place] with new onset or worsening of symptoms . Confirmed the patient is knowledgeable of how to access Apple 6 EKG feature . Scheduled follow up over the next two weeks  Completed 09/20/19 . Voice message received from [facility] with [name] office indicating [name] felt with atypical chest pain the patient would be okay to wait for radiology to perform test as originally ordered o Requested the patient contact [place] with new onset of symptoms o Requested the patient use Apple 6 watch EKG feature to monitor heart  rate when palpitations occur and report back to provider office . Unsuccessful outbound call placed to the patient to review instructions from [employer] . Collaboration with Delsa Sale, RN Care Manager regarding patient instructions  Completed 09/17/19 . Inter-disciplinary care team collaboration (see longitudinal plan of care) . Inbound call received from the patient in response to  voice message left by SW . Patient interviewed and appropriate assessments performed o Determined the patient has been seen in the ED x 4 over the last 30 days o The patient reports seen by Cardiologist, Dr [name] on 4/26 whom ordered an MRI and CAT scan to investigate cause of patient heart palpitations . Determined the patient has yet to be contacted by imaging location to have tests performed o The patient reports she has been in contact with her health plan whom reports they have yet to receive a request for imaging studies o Patient reports she has contacted Radiology department in which referral was sent whom reports a backlog of orders . Discussed the patient continues to have symptoms associated with atypical chest pain o The patient reports ongoing SOB and dizziness o The patient is awoken by rapid heart rate on occasion with averages of 160-180 BPM when this occurs o The patient reports she tries to relax while episode occurs o The patient stated "I did everything I was supposed to do but nothing is happening" . Advised the patient SW would outreach clinical team to address barriers related to imaging studies . Encouraged the patient to contact her cardiology team and primary care provider with ongoing symptoms as needed . Provided direct contact number to SW for the patient to contact SW directly . Collaboration with Marja Kays, RN team lead and Delsa Sale, embedded RN care manager regarding patient barriers to obtain imaging studies . Collaboration with [name] 's office to report ongoing symptoms reported by the patient as well as barriers associated with ordered imaging studies o Requested follow up to determine if orders should be marked urgent or if the patient could receive imaging studies at a different location . Scheduled follow up call to the patient over the next 5 days  Patient Self Care Activities:  . Patient verbalizes understanding of plan to work with  embedded care management team to address ongoing care coordination needs . Self administers medications as prescribed . Performs ADL's independently . Performs IADL's independently . Calls provider office for new concerns or questions  Please see past updates related to this goal by clicking on the "Past Updates" button in the selected goal          Follow Up Plan: SW will follow up with patient by phone over the next two weeks.   Bevelyn Ngo, BSW, CDP Social Worker, Certified Dementia Practitioner [facility] Care Management [phone]

## 2019-09-23 ENCOUNTER — Telehealth: Payer: Self-pay

## 2019-09-24 ENCOUNTER — Ambulatory Visit (INDEPENDENT_AMBULATORY_CARE_PROVIDER_SITE_OTHER): Payer: 59 | Admitting: Internal Medicine

## 2019-09-24 ENCOUNTER — Ambulatory Visit: Payer: 59

## 2019-09-24 ENCOUNTER — Encounter: Payer: Self-pay | Admitting: Internal Medicine

## 2019-09-24 ENCOUNTER — Other Ambulatory Visit: Payer: Self-pay

## 2019-09-24 VITALS — BP 120/80 | HR 99

## 2019-09-24 DIAGNOSIS — J4540 Moderate persistent asthma, uncomplicated: Secondary | ICD-10-CM

## 2019-09-24 LAB — NITRIC OXIDE: Nitric Oxide: 6

## 2019-09-24 MED ORDER — BREO ELLIPTA 100-25 MCG/INH IN AEPB: 1.0000 | INHALATION_SPRAY | Freq: Every day | RESPIRATORY_TRACT | 11 refills | Status: DC

## 2019-09-24 NOTE — Progress Notes (Signed)
[name]    [id number]    Jan 19, 1976  Primary Care Physician:[name], Garrett Date of Appointment: 09/24/2019 Established Patient Visit  Chief complaint:   Chief Complaint  Patient presents with  . Follow-up    PFT results.  sob with exertion    HPI: [name] is a 44 y.o. woman with childhood bronchitis and asthma.   Interval Updates: At last visit was started on advair and had subsequent chest pain dizziness and stomach discomfort. She stopped advair because of this. symbicort non formulary. Hasn't picked up anything else.   Having issues with fast HR waking her up 160-180. Currently taking albuterol 2-4 times/week. Still sob doing stairs.   Current Regimen:  prn albuterol only. Asthma Triggers: exercise, talking for a long time.  Exacerbations in the last year: 1 (previously has gone multiple times a year since 2018.) History of hospitalization or intubation: none.  Allergy Testing: in 2019 environmental allergy skin prick testing is positive to box elder, dust mites and cockroach. Select food allergy skin prick testing was positive to salmon GERD: yes on protonix BID (still having heart burn.) on carafate as well.  Allergic Rhinitis: yes taking claritin every day.  ACT:  Asthma Control Test ACT Total Score  09/24/2019 16   FeNO: 6 ppb on 5/21  I have reviewed the patient's family social and past medical history and updated as appropriate.   Past Medical History:  Diagnosis Date  . Allergy   . Anemia   . Angio-edema   . Anxiety   . Arthritis   . Constipation   . GERD (gastroesophageal reflux disease)   . Hx of endometriosis   . Insomnia   . Macromastia 07/2011  . Migraine   . Urticaria     Past Surgical History:  Procedure Laterality Date  . [place] STUDY N/A 01/12/2018   Procedure: [place] STUDY;  Surgeon: Mauri Pole, MD;  Location: [facility];  Service: Endoscopy;  Laterality: N/A;  . ABDOMINAL HYSTERECTOMY   12/12/2003  . ADENOIDECTOMY  05/2011  . ANKLE ARTHROSCOPY  12/29/2007   right; with extensive debridement  . BLADDER NECK RECONSTRUCTION  01/14/2011   Procedure: BLADDER NECK REPAIR;  Surgeon: Donnamae Jude, MD;  Location: [facility];  Service: Gynecology;  Laterality: N/A;  Laparoscopic Repair of Incidental Cystotomy  . BREAST REDUCTION SURGERY  08/05/2011   Procedure: MAMMARY REDUCTION  (BREAST);  Surgeon: Cristine Polio, MD;  Location: [facility];  Service: Plastics;  Laterality: Bilateral;  bilateral  . CESAREAN SECTION  12/29/2000; 1994  . DILATION AND CURETTAGE OF UTERUS  07/08/2003   open laparoscopy  . ESOPHAGEAL MANOMETRY N/A 01/12/2018   Procedure: ESOPHAGEAL MANOMETRY (EM);  Surgeon: Mauri Pole, MD;  Location: [facility];  Service: Endoscopy;  Laterality: N/A;  . GIVENS CAPSULE STUDY N/A 06/23/2017   Procedure: GIVENS CAPSULE STUDY;  Surgeon: Yetta Flock, MD;  Location: [place];  Service: Gastroenterology;  Laterality: N/A;  . LAPAROSCOPIC SALPINGOOPHERECTOMY  01/14/2011   left  . [place] IMPEDANCE STUDY N/A 01/12/2018   Procedure: [place] IMPEDANCE STUDY;  Surgeon: Mauri Pole, MD;  Location: [facility];  Service: Endoscopy;  Laterality: N/A;  . REPAIR PERONEAL TENDONS ANKLE  01/03/2010   repair right subluxing peroneal tendons  . RESECTION DISTAL CLAVICAL Right 09/25/2017   Procedure: RESECTION DISTAL CLAVICAL;  Surgeon: Hiram Gash, MD;  Location: [place];  Service: Orthopedics;  Laterality: Right;  . RIGHT OOPHORECTOMY     with lysis of adhesions  . SHOULDER ACROMIOPLASTY Right 09/25/2017   Procedure: SHOULDER ACROMIOPLASTY;  Surgeon: Bjorn Pippin, MD;  Location: [facility];  Service: Orthopedics;  Laterality: Right;  . SHOULDER ARTHROSCOPY WITH DEBRIDEMENT AND BICEP TENDON REPAIR Right 09/25/2017   Procedure: SHOULDER ARTHROSCOPY WITH DEBRIDEMENT AND BICEP TENDON REPAIR;  Surgeon: Bjorn Pippin, MD;  Location: [facility];  Service: Orthopedics;  Laterality: Right;  . SHOULDER ARTHROSCOPY WITH ROTATOR CUFF REPAIR Right 09/25/2017   Procedure: SHOULDER ARTHROSCOPY WITH ROTATOR CUFF REPAIR;  Surgeon: Bjorn Pippin, MD;  Location: [facility];  Service: Orthopedics;  Laterality: Right;  . TONSILLECTOMY  as a child  . TUBAL LIGATION  12/29/2000    Family History  Problem Relation Age of Onset  . Diabetes Mother   . Hypertension Mother   . Thyroid disease Sister   . Heart attack Maternal Grandmother   . Stroke Maternal Grandfather   . Asthma Son   . Asthma Daughter   . Colon cancer Neg Hx   . Colon polyps Neg Hx   . Esophageal cancer Neg Hx   . Rectal cancer Neg Hx   . Stomach cancer Neg Hx     Social History   Occupational History    Employer: OTHER    Comment: [employer]  Tobacco Use  . Smoking status: Never Smoker  . Smokeless tobacco: Never Used  Substance and Sexual Activity  . Alcohol use: Yes    Comment: occasional   . Drug use: No  . Sexual activity: Not on file    Physical Exam: Blood pressure 120/80, pulse 99, last menstrual period 12/27/2003, SpO2 100 %.  Gen:      No acute distress Lungs:    No increased respiratory effort, symmetric chest wall excursion, clear to auscultation bilaterally, no wheezes or crackles CV:         Regular rate and rhythm; no murmurs, rubs, or gallops.  No pedal edema   Data Reviewed: Imaging: I have personally reviewed the chest xray April 2021 is clear.   PFTs:  PFT Results Latest Ref Rng & Units 03/12/2017  FVC-Pre L 3.52  FVC-Predicted Pre % 114  FVC-Post L 3.25  FVC-Predicted Post % 106  Pre FEV1/FVC % % 79  Post FEV1/FCV % % 86  FEV1-Pre L 2.77  FEV1-Predicted Pre % 110  FEV1-Post L 2.78  DLCO UNC% % 97  DLCO COR %Predicted % 113  TLC L 4.54  TLC % Predicted % 89  RV % Predicted % 78   I have personally reviewed the patient's PFTs and spirometry today shows: No airflow limitation but there was a  significant response to bronchodilator challenge.  Labs:IgE 33, environmental IgE panel negative (immunocap) in 2019.  Immunization status: Immunization History  Administered Date(s) Administered  . Influenza,inj,Quad PF,6+ Mos 02/15/2016, 12/24/2017, 02/24/2019  . Td 05/06/1994  . Tdap 12/06/2015    Assessment:  Moderate persistent asthma.  Esophageal Dysmotility and uncontrolled reflux  Plan/Recommendations: Start Breo 100 once daily - this is covered under formulary Continue prn albuterol Uncontrolled reflux likely worsening her asthma control.   Return to Care: Return in about 4 months (around 01/25/2020).   Durel Salts, MD Pulmonary and Critical Care Medicine [facility] [phone]

## 2019-09-24 NOTE — Patient Instructions (Addendum)
The patient should have follow up scheduled with myself in 4 months.   Start taking Breo once a day. Need to gargle after using this.  Take the albuterol rescue inhaler every 4 to 6 hours as needed for wheezing or shortness of breath. You can also take it 15 minutes before exercise or exertional activity. Side effects include heart racing or pounding, jitters or anxiety. If you have a history of an irregular heart rhythm, it can make this worse. Can also give some patients a hard time sleeping.  To inhale the aerosol using an inhaler, follow these steps:  1. Remove the protective dust cap from the end of the mouthpiece. If the dust cap was not placed on the mouthpiece, check the mouthpiece for dirt or other objects. Be sure that the canister is fully and firmly inserted in the mouthpiece. 2. If you are using the inhaler for the first time or if you have not used the inhaler in more than 14 days, you will need to prime it. You may also need to prime the inhaler if it has been dropped. Ask your pharmacist or check the manufacturer's information if this happens. To prime the inhaler, shake it well and then press down on the canister 4 times to release 4 sprays into the air, away from your face. Be careful not to get albuterol in your eyes. 3. Shake the inhaler well. 4. Breathe out as completely as possible through your mouth. 4. Hold the canister with the mouthpiece on the bottom, facing you and the canister pointing upward. Place the open end of the mouthpiece into your mouth. Close your lips tightly around the mouthpiece. 6. Breathe in slowly and deeply through the mouthpiece.At the same time, press down once on the container to spray the medication into your mouth. 7. Try to hold your breath for 10 seconds. remove the inhaler, and breathe out slowly. 8. If you were told to use 2 puffs, wait 1 minute and then repeat steps 3-7. 9. Replace the protective cap on the inhaler. 10. Clean your inhaler  regularly. Follow the manufacturer's directions carefully and ask your doctor or pharmacist if you have any questions about cleaning your inhaler.  Check the back of the inhaler to keep track of the total number of doses left on the inhaler.

## 2019-09-27 ENCOUNTER — Ambulatory Visit: Payer: 59 | Admitting: Podiatry

## 2019-09-27 ENCOUNTER — Telehealth: Payer: Self-pay

## 2019-09-27 ENCOUNTER — Ambulatory Visit: Payer: Self-pay

## 2019-09-27 ENCOUNTER — Other Ambulatory Visit: Payer: Self-pay

## 2019-09-27 DIAGNOSIS — E559 Vitamin D deficiency, unspecified: Secondary | ICD-10-CM

## 2019-09-27 DIAGNOSIS — R079 Chest pain, unspecified: Secondary | ICD-10-CM

## 2019-09-27 DIAGNOSIS — E669 Obesity, unspecified: Secondary | ICD-10-CM

## 2019-09-27 DIAGNOSIS — R0600 Dyspnea, unspecified: Secondary | ICD-10-CM

## 2019-09-27 DIAGNOSIS — G8929 Other chronic pain: Secondary | ICD-10-CM

## 2019-09-27 DIAGNOSIS — R5383 Other fatigue: Secondary | ICD-10-CM

## 2019-09-27 DIAGNOSIS — R0609 Other forms of dyspnea: Secondary | ICD-10-CM

## 2019-09-28 ENCOUNTER — Other Ambulatory Visit: Payer: Self-pay | Admitting: *Deleted

## 2019-09-28 ENCOUNTER — Telehealth: Payer: Self-pay | Admitting: Cardiology

## 2019-09-28 DIAGNOSIS — R002 Palpitations: Secondary | ICD-10-CM

## 2019-09-28 NOTE — Telephone Encounter (Signed)
[name] is calling from Dr. Robyn Sanders office. She wants to know what Dr. Crenshaw's opinions are as far as getting this patient a heart monitor. She states that the patient does not think she has worn one before. She has recently learned that her father died about a year ago and had several strokes. She has some concerns with afib for herself and giving family history she thinks it is best to get a heart monitor to see whats going on. Please contact Sheila in regards to this.  

## 2019-09-28 NOTE — Telephone Encounter (Signed)
Spoke to [employer] with [employer] office.She stated patient has been complaining of frequent palpitations.Stated heart beat is fast and irregular at times.Hard to track on her smart watch.Also she has not received coronary ct appointment that was ordered in April.Advised will order a 30 day event monitor.I will send coronary ct scheduler a message to find out about coronary ct.Stated she would like to know when it gets scheduled.

## 2019-09-28 NOTE — Telephone Encounter (Signed)
Angel Little with Dr. Dorothyann Peng office is calling to check up on patients CT Morph. Please advise.

## 2019-09-28 NOTE — Telephone Encounter (Signed)
Angel returning Debra's call.

## 2019-09-29 ENCOUNTER — Telehealth: Payer: Self-pay

## 2019-09-29 ENCOUNTER — Ambulatory Visit: Payer: Self-pay

## 2019-09-29 ENCOUNTER — Other Ambulatory Visit: Payer: Self-pay

## 2019-09-29 DIAGNOSIS — R1319 Other dysphagia: Secondary | ICD-10-CM

## 2019-09-29 DIAGNOSIS — R0609 Other forms of dyspnea: Secondary | ICD-10-CM

## 2019-09-29 DIAGNOSIS — R0600 Dyspnea, unspecified: Secondary | ICD-10-CM

## 2019-09-29 DIAGNOSIS — E669 Obesity, unspecified: Secondary | ICD-10-CM

## 2019-09-29 DIAGNOSIS — G8929 Other chronic pain: Secondary | ICD-10-CM

## 2019-09-29 DIAGNOSIS — E559 Vitamin D deficiency, unspecified: Secondary | ICD-10-CM

## 2019-09-29 DIAGNOSIS — R079 Chest pain, unspecified: Secondary | ICD-10-CM

## 2019-09-29 DIAGNOSIS — R5383 Other fatigue: Secondary | ICD-10-CM

## 2019-09-30 ENCOUNTER — Ambulatory Visit: Payer: 59 | Admitting: Orthopaedic Surgery

## 2019-09-30 NOTE — Patient Instructions (Signed)
Visit Information  Goals Addressed      Patient Stated   . "I feel like something gets stuck in my throat when I swallow" (pt-stated)       CARE PLAN ENTRY (see longitudinal plan of care for additional care plan information)  Current Barriers:  [name] Knowledge Deficits related to evaluation and treatment of dysphasia  . Chronic Disease Management support and education needs related to Chest Pain, unspecified; Obesity; Dyspnea on Exertion; Chronic right shoulder pain; fatigue, unspecified; Vitamin D, unspecified, other dysphasia    Nurse Case Manager Clinical Goal(s):  [name] Over the next 90 days, patient will work with CC RN CM and PCP to address needs related to evaluate and treat symptoms of dysphasia  CCM RN CM Interventions:  09/29/19 call completed with patient  . Inter-disciplinary care team collaboration (see longitudinal plan of care) . Evaluation of current treatment plan related to dysphasia and patient's adherence to plan as established by provider . Determined patient is established with GI MD Dr. Adela Lank with the most recent OV completed on 08/19/19; Determined the following prior workup is noted;  o Prior workup: o Capsule endoscopy - 06/23/17 - complete study with good prep, no clear cause for iron deficiency o EGD 02/20/2017 - 2cm HH, otherwise normal exam, biopsies negative for HP o Colonoscopy 02/20/2017 - focal mild erythema in the rectum - biopsied showed no evidence of colitis, otherwise normal colon and ileum  o MRI liver / abdomen - 02/10/2017 - focal fatty infiltration, normal biliary tree, otherwise no pathology noted in the abdomen o Historic evaluation: o CT scan neck 12/02/16 - normal o CT angio of chest 12/02/2016 - normal o EGD 08/19/2013 - Dr. Alycia Rossetti - normal esophagus, mild gastritis o Esophageal manometry 2015 - normal o PH study 2015 - normal exam, Demeester score of 5.7, positive symptom index for regurgitation and reflux o EGG normal 2015 o Normal hydrogen  breath test 2015 o Normal gastric emptying scan 2015 o GES - delayed in 2011 - Reglan caused twitching and it was stopped. o GES 11/13/2017 - normal o Manometry / 24 hr pH impedance testing was recommended o Esophageal manometry 01/12/18 - normal o PH impedance study 01/12/18 - Demeester score 9, SAP positively correlates with reflux, good acid suppression on PPI . Reviewed medications with patient and discussed patient is adhering to taking Pantoprazole exactly as prescribed with good effectiveness for GERD, however, this has not improved the feeling of having food "stuck" in her esphagus . Discussed and determined Ms. Cake f/u with an Endocrinologist to further evaluate an abnormal [place] of her throat and was advised her Thyroid is normal and per the patient, it was recommended to have a CT scan of her throat to further evaluate her swallowing difficulties . Collaborated with PCP provider [name], FNP via in basket message regarding patient's concerns related to swallowing difficulty and current treatment is not relieving symptoms  . Discussed plans with patient for ongoing care management follow up and provided patient with direct contact information for care management team  Patient Self Care Activities:  . Self administers medications as prescribed . Attends all scheduled provider appointments . Calls pharmacy for medication refills . Performs ADL's independently . Performs IADL's independently . Calls provider office for new concerns or questions  Initial goal documentation     . "to find out what's causing my heart palpitations" (pt-stated)       CARE PLAN ENTRY (see longitudinal plan of care for additional care plan  information)  Current Barriers:  [name] Knowledge Deficits related to evaluation and treatment of Cardiac symptoms including, chest pain, unspecified, Palpitations, Dyspnea on Exertion . Chronic Disease Management support and education needs related to Chest Pain,  unspecified; Obesity; Dyspnea on Exertion; Chronic right shoulder pain; fatigue, unspecified; Vitamin D, unspecified   Nurse Case Manager Clinical Goal(s):  [name] Over the next 90 days, patient will work with the [place] CM and PCP  to address needs related to evaluate and treat persistent Cardiac symptoms including Chest pain, unspecified , Palpitations and Dyspnea on Exertion   CCM RN CM Interventions:  09/28/19 call completed with patient  . Inter-disciplinary care team collaboration (see longitudinal plan of care) . Evaluation of current treatment plan related to persistent Cardiac symptoms including Chest Pain, unspecified; Palpitations; and Dyspnea on Exertion and patient's adherence to plan as established by provider . Determined patient has been experiencing persistent episodes of irregular tachycardia that "come and go"; other associated symptoms include, Chest pain, dizziness or lightheadedness, and dyspnea with stair climbing and or with exertion; these symptoms started "several years ago" . Determined patient is established with Dr. Oval Linsey and [name] and that all testing completed has been negative . Determined patient has been tracking some of the tachycardic episodes on her smart watch but has never worn a Holter monitor that she can recall . Determined [name] ordered a CTA on 08/30/19 that has not been scheduled due to high demand for this type of testing . Determined patient has family history of Cardiac disease to include her maternal grandmother suffered from several heart attacks and her estranged father may have suffered from Atrial fibrillation and passes away about 1 year ago after having several strokes . Reviewed medications with patient and discussed indication, dosage and frequency of all prescribed medications; patient is not currently taking Breo as prescribed by Pulmonology, she states, "I don't like the way it makes me feel" . Collaborated with Malachy Mood from Dr.  Jacalyn Lefevre office regarding has not received a call regarding the scheduling of her Cardia CT; Advised patient continues to have palpitations and is describing symptoms suggestive of Atrial fibrillation; Advised patient has not been ordered to wear an event monitor and is tracking the tachycardia with her Apple Watch but is not finding this to be helpful since she is already aware of the fast irregular heart beat when it occurs . Determined [name] is agreeable to ordering a 30 day event monitor and the office will contact the patient with the details, the monitor will be mailed to her home with instructions for use . Discussed plans with patient for ongoing care management follow up and provided patient with direct contact information for care management team 09/29/19 call completed with patient  . Determined patient received a call from the Cardiologist office advising she will receive the Holter monitor via mail to her home, to further evaluate her palpitations per [name] . Determined patient was advised by the Cardiologist office that her insurance company has yet to provide a prior authorization for her Cardiac CT and this is causing the delay in getting it scheduled . Discussed patient will contact this RN tomorrow am to complete a joint call to [facility] to inquire about the PA needed for the Cardiac CT  Patient Self Care Activities:  . Self administers medications as prescribed . Attends all scheduled provider appointments . Calls pharmacy for medication refills . Performs ADL's independently . Performs IADL's independently . Calls provider office  for new concerns or questions  Please see past updates related to this goal by clicking on the "Past Updates" button in the selected goal        Patient verbalizes understanding of instructions provided today.   Telephone follow up appointment with care management team member scheduled for: 10/05/19  Delsa Sale, RN, BSN,  CCM Care Management Coordinator [facility] Care Management/[facility]  Direct Phone: [phone]

## 2019-09-30 NOTE — Patient Instructions (Addendum)
Visit Information  Goals Addressed      Patient Stated   . "I would like to get my Vitamin D level up to normal limits" (pt-stated)       CARE PLAN ENTRY (see longitudinal plan of care for additional care plan information)  Current Barriers:  [name] Knowledge Deficits related to disease education and support for treatment and maintenance of Vitamin D  . Chronic Disease Management support and education needs related to Chest Pain, unspecified; Obesity; Dyspnea on Exertion; Chronic right shoulder pain; fatigue, unspecified; Vitamin D, unspecified   Nurse Case Manager Clinical Goal(s):  [name] Over the next 90 days, patient will work with the CCM RN CM and PCP to address needs related to disease education and support to improve Self Health management of Vitamin D deficiency   CCM RN CM Interventions:  09/28/19 call completed with patient  . Inter-disciplinary care team collaboration (see longitudinal plan of care) . Evaluation of current treatment plan related to 90 and patient's adherence to plan as established by provider. . Provided education to patient re: potential causes of Vitamin D deficiency and the effects this can have on your health, treatment and ongoing monitoring of this condition  . Reviewed medications with patient and discussed patient is taking a supplement as prescribed by PCP, reports adherence w/o noted SE  . Discussed plans with patient for ongoing care management follow up and provided patient with direct contact information for care management team . Provided patient with printed  educational materials related to Vitamin D deficiency   Patient Self Care Activities:  . Self administers medications as prescribed . Attends all scheduled provider appointments . Calls pharmacy for medication refills . Performs ADL's independently . Performs IADL's independently . Calls provider office for new concerns or questions  Initial goal documentation     . "to find out what's  causing my heart palpitations" (pt-stated)       CARE PLAN ENTRY (see longitudinal plan of care for additional care plan information)  Current Barriers:  [name] Knowledge Deficits related to evaluation and treatment of Cardiac symptoms including, chest pain, unspecified, Palpitations, Dyspnea on Exertion . Chronic Disease Management support and education needs related to Chest Pain, unspecified; Obesity; Dyspnea on Exertion; Chronic right shoulder pain; fatigue, unspecified; Vitamin D, unspecified   Nurse Case Manager Clinical Goal(s):  [name] Over the next 90 days, patient will work with the CCM RN CM and PCP  to address needs related to evaluate and treat persistent Cardiac symptoms including Chest pain, unspecified , Palpitations and Dyspnea on Exertion   CCM RN CM Interventions:  09/28/19 call completed with patient  . Inter-disciplinary care team collaboration (see longitudinal plan of care) . Evaluation of current treatment plan related to persistent Cardiac symptoms including Chest Pain, unspecified; Palpitations; and Dyspnea on Exertion and patient's adherence to plan as established by provider . Determined patient has been experiencing persistent episodes of irregular tachycardia that "come and go"; other associated symptoms include, Chest pain, dizziness or lightheadedness, and dyspnea with stair climbing and or with exertion; these symptoms started "several years ago" . Determined patient is established with Dr. Duke Salvia and [name] and that all testing completed has been negative . Determined patient has been tracking some of the tachycardic episodes on her smart watch but has never worn a Holter monitor that she can recall . Determined [name] ordered a CTA on 08/30/19 that has not been scheduled due to high demand for this type of testing . Determined  patient has family history of Cardiac disease to include her maternal grandmother suffered from several heart attacks and her estranged  father may have suffered from Atrial fibrillation and passes away about 1 year ago after having several strokes . Reviewed medications with patient and discussed indication, dosage and frequency of all prescribed medications; patient is not currently taking Breo as prescribed by Pulmonology, she states, "I don't like the way it makes me feel" . Collaborated with Elnita Maxwell from Dr. Ludwig Clarks office regarding has not received a call regarding the scheduling of her Cardia CT; Advised patient continues to have palpitations and is describing symptoms suggestive of Atrial fibrillation; Advised patient has not been ordered to wear an event monitor and is tracking the tachycardia with her Apple Watch but is not finding this to be helpful since she is already aware of the fast irregular heart beat when it occurs . Determined [name] is agreeable to ordering a 30 day event monitor and the office will contact the patient with the details, the monitor will be mailed to her home with instructions for use . Discussed plans with patient for ongoing care management follow up and provided patient with direct contact information for care management team  Patient Self Care Activities:  . Self administers medications as prescribed . Attends all scheduled provider appointments . Calls pharmacy for medication refills . Performs ADL's independently . Performs IADL's independently . Calls provider office for new concerns or questions  Initial goal documentation     . "to keep my BP under good control" (pt-stated)       CARE PLAN ENTRY (see longitudinal plan of care for additional care plan information)  Current Barriers:  [name] Knowledge Deficits related to disease process and Self Health management of HTN . Chronic Disease Management support and education needs related to Chest Pain, unspecified; Obesity; Dyspnea on Exertion; Chronic right shoulder pain; fatigue, unspecified; Vitamin D, unspecified   Nurse Case  Manager Clinical Goal(s):  [name] Over the next 90 days, patient will work with the CCM RN and PCP  to address needs related to disease education and support to help improve Self Health management of HTN   CCM RN CM Interventions:  09/28/19 call completed with patient  Inter-disciplinary care team collaboration (see longitudinal plan of care) . Evaluation of current treatment plan related to HTN and patient's adherence to plan as established by provider. . Provided education to patient re: disease process and how to Self management; Educated on target BP of <130/80 . Reviewed medications with patient and discussed indications, dosage and frequency of prescribed medications, patient is not currently taking Breo for Asthma, no other discrepancies identified, pt reports adherence  . Discussed plans with patient for ongoing care management follow up and provided patient with direct contact information for care management team . Provided patient with printed educational materials related to What is High Blood Pressure?; Why Should I Lower Sodium?; African-Americans and High Blood Pressure; High Blood Pressure and Stroke; Life's Simple 7  Patient Self Care Activities:  . Self administers medications as prescribed . Attends all scheduled provider appointments . Calls pharmacy for medication refills . Performs ADL's independently . Performs IADL's independently . Calls provider office for new concerns or questions  Initial goal documentation       Other   . COMPLETED: Assist with Care Coordination needs and Chronic Care Management       CARE PLAN ENTRY (see longtitudinal plan of care for additional care plan information)  Current Barriers:  . Chronic Disease Management support, education, and care coordination needs related to  Impaired Cardiac dysfunction (palpitations)   Case Manager Clinical Goal(s):  [name] Over the next 30 days, patient will work with the CCM team to address needs related to  Care Coordination in patient with Impaired Cardiac dysfunction (palpitations)   Interventions:  . Collaborated with BSW to initiate plan of care to address needs related to  completion of Cardiac CT  in patient with Impaired Cardiac dysfunction (palpitations)   Patient Self Care Activities:  . Self administers medications as prescribed . Attends all scheduled provider appointments . Calls pharmacy for medication refills . Calls provider office for new concerns or questions  Initial goal documentation       Patient verbalizes understanding of instructions provided today.   Telephone follow up appointment with care management team member scheduled for: 10/05/19  [name], RN, BSN, CCM Care Management Coordinator [facility]  Direct Phone: [phone]

## 2019-09-30 NOTE — Chronic Care Management (AMB) (Signed)
Care Management   Follow Up Note   09/29/2019 Name: [name] MRN: [id number] DOB: February 11, 1976  Referred by: Sheila Felts, FNP Reason for referral : Care Coordination (FU RN CC Call - inbound call from patient )   [name] is a 44 y.o. year old female who is a primary care patient of Sheila Felts, FNP. The CCM team was consulted for assistance with chronic disease management and care coordination needs.    Review of patient status, including review of consultants reports, relevant laboratory and other test results, and collaboration with appropriate care team members and the patient's provider was performed as part of comprehensive patient evaluation and provision of chronic care management services.    SDOH (Social Determinants of Health) assessments performed: No See Care Plan activities for detailed interventions related to SDOH)  Inbound call received from patient regarding her Cardiac CT and event monitor.      Outpatient Encounter Medications as of 09/29/2019  Medication Sig  . albuterol (VENTOLIN HFA) 108 (90 Base) MCG/ACT inhaler Inhale 2 puffs into the lungs every 6 (six) hours as needed for wheezing or shortness of breath.  . fluticasone furoate-vilanterol (BREO ELLIPTA) 100-25 MCG/INH AEPB Inhale 1 puff into the lungs daily.  . metoprolol tartrate (LOPRESSOR) 100 MG tablet TAKE 2 HOURS PRIOR TO CT SCAN  . pantoprazole (PROTONIX) 40 MG tablet Take 1 tablet (40 mg total) by mouth 2 (two) times daily.  . sucralfate (CARAFATE) 1 g tablet Take 1 tablet (1 g total) by mouth every 6 (six) hours. Make a slurry by slowly dissolving 1 tablet in 1 Tablespoon of distilled water before taking   No facility-administered encounter medications on file as of 09/29/2019.     Objective:  Lab Results  Component Value Date   HGBA1C 5.7 (H) 06/17/2019   HGBA1C 5.1 12/24/2017   HGBA1C 5.1 06/16/2017   Lab Results  Component Value Date   LDLCALC 108 (H) 06/17/2019   CREATININE 0.94 08/15/2019   BP Readings from Last 3 Encounters:  09/24/19 120/80  08/30/19 122/72  08/25/19 129/79    Goals Addressed      Patient Stated   . "I feel like something gets stuck in my throat when I swallow" (pt-stated)       CARE PLAN ENTRY (see longitudinal plan of care for additional care plan information)  Current Barriers:  [name] Knowledge Deficits related to evaluation and treatment of dysphasia  . Chronic Disease Management support and education needs related to Chest Pain, unspecified; Obesity; Dyspnea on Exertion; Chronic right shoulder pain; fatigue, unspecified; Vitamin D, unspecified, other dysphasia    Nurse Case Manager Clinical Goal(s):  [name] Over the next 90 days, patient will work with CC RN CM and PCP to address needs related to evaluate and treat symptoms of dysphasia  CCM RN CM Interventions:  09/29/19 call completed with patient  . Inter-disciplinary care team collaboration (see longitudinal plan of care) . Evaluation of current treatment plan related to dysphasia and patient's adherence to plan as established by provider . Determined patient is established with GI MD Dr. Adela [name] with the most recent OV completed on 08/19/19; Determined the following prior workup is noted;  o Prior workup: o Capsule endoscopy - 06/23/17 - complete study with good prep, no clear cause for iron deficiency o EGD 02/20/2017 - 2cm HH, otherwise normal exam, biopsies negative for HP o Colonoscopy 02/20/2017 - focal mild erythema in the rectum - biopsied showed no evidence of colitis, otherwise normal  colon and ileum  o MRI liver / abdomen - 02/10/2017 - focal fatty infiltration, normal biliary tree, otherwise no pathology noted in the abdomen o Historic evaluation: o CT scan neck 12/02/16 - normal o CT angio of chest 12/02/2016 - normal o EGD 08/19/2013 - [name] - normal esophagus, mild gastritis o Esophageal manometry 2015 - normal o PH study 2015 - normal exam, Demeester  score of 5.7, positive symptom index for regurgitation and reflux o EGG normal 2015 o Normal hydrogen breath test 2015 o Normal gastric emptying scan 2015 o GES - delayed in 2011 - Reglan caused twitching and it was stopped. o GES 11/13/2017 - normal o Manometry / 24 hr pH impedance testing was recommended o Esophageal manometry 01/12/18 - normal o PH impedance study 01/12/18 - Demeester score 9, SAP positively correlates with reflux, good acid suppression on PPI . Reviewed medications with patient and discussed patient is adhering to taking Pantoprazole exactly as prescribed with good effectiveness for GERD, however, this has not improved the feeling of having food "stuck" in her esphagus . Discussed and determined [name] f/u with an Endocrinologist to further evaluate an abnormal [place] of her throat and was advised her Thyroid is normal and per the patient, it was recommended to have a CT scan of her throat to further evaluate her swallowing difficulties . Collaborated with PCP provider Sheila Felts, FNP via in basket message regarding patient's concerns related to swallowing difficulty and current treatment is not relieving symptoms  . Discussed plans with patient for ongoing care management follow up and provided patient with direct contact information for care management team  Patient Self Care Activities:  . Self administers medications as prescribed . Attends all scheduled provider appointments . Calls pharmacy for medication refills . Performs ADL's independently . Performs IADL's independently . Calls provider office for new concerns or questions  Initial goal documentation     . "to find out what's causing my heart palpitations" (pt-stated)       CARE PLAN ENTRY (see longitudinal plan of care for additional care plan information)  Current Barriers:  [name] Knowledge Deficits related to evaluation and treatment of Cardiac symptoms including, chest pain, unspecified, Palpitations,  Dyspnea on Exertion . Chronic Disease Management support and education needs related to Chest Pain, unspecified; Obesity; Dyspnea on Exertion; Chronic right shoulder pain; fatigue, unspecified; Vitamin D, unspecified   Nurse Case Manager Clinical Goal(s):  [name] Over the next 90 days, patient will work with the CCM RN CM and PCP  to address needs related to evaluate and treat persistent Cardiac symptoms including Chest pain, unspecified , Palpitations and Dyspnea on Exertion   CCM RN CM Interventions:  09/28/19 call completed with patient  . Inter-disciplinary care team collaboration (see longitudinal plan of care) . Evaluation of current treatment plan related to persistent Cardiac symptoms including Chest Pain, unspecified; Palpitations; and Dyspnea on Exertion and patient's adherence to plan as established by provider . Determined patient has been experiencing persistent episodes of irregular tachycardia that "come and go"; other associated symptoms include, Chest pain, dizziness or lightheadedness, and dyspnea with stair climbing and or with exertion; these symptoms started "several years ago" . Determined patient is established with Dr. Duke Salvia and [name] and that all testing completed has been negative . Determined patient has been tracking some of the tachycardic episodes on her smart watch but has never worn a Holter monitor that she can recall . Determined [name] ordered a CTA on 08/30/19 that  has not been scheduled due to high demand for this type of testing . Determined patient has family history of Cardiac disease to include her maternal grandmother suffered from several heart attacks and her estranged father may have suffered from Atrial fibrillation and passes away about 1 year ago after having several strokes . Reviewed medications with patient and discussed indication, dosage and frequency of all prescribed medications; patient is not currently taking Breo as prescribed by  Pulmonology, she states, "I don't like the way it makes me feel" . Collaborated with Malachy Mood from Dr. Jacalyn Lefevre office regarding has not received a call regarding the scheduling of her Cardia CT; Advised patient continues to have palpitations and is describing symptoms suggestive of Atrial fibrillation; Advised patient has not been ordered to wear an event monitor and is tracking the tachycardia with her Apple Watch but is not finding this to be helpful since she is already aware of the fast irregular heart beat when it occurs . Determined [name] is agreeable to ordering a 30 day event monitor and the office will contact the patient with the details, the monitor will be mailed to her home with instructions for use . Discussed plans with patient for ongoing care management follow up and provided patient with direct contact information for care management team 09/29/19 call completed with patient  . Determined patient received a call from the Cardiologist office advising she will receive the Holter monitor via mail to her home, to further evaluate her palpitations per [name] . Determined patient was advised by the Cardiologist office that her insurance company has yet to provide a prior authorization for her Cardiac CT and this is causing the delay in getting it scheduled . Discussed patient will contact this RN tomorrow am to complete a joint call to [facility] to inquire about the PA needed for the Cardiac CT  Patient Self Care Activities:  . Self administers medications as prescribed . Attends all scheduled provider appointments . Calls pharmacy for medication refills . Performs ADL's independently . Performs IADL's independently . Calls provider office for new concerns or questions  Please see past updates related to this goal by clicking on the "Past Updates" button in the selected goal        Plan:   Telephone follow up appointment with care management team member scheduled  for: 10/05/19  [name], RN, BSN, CCM Care Management Coordinator [facility]  Direct Phone: [phone]

## 2019-09-30 NOTE — Chronic Care Management (AMB) (Signed)
Care Management   Initial Visit Note  09/28/2019 Name: [name] MRN: [id number] DOB: [birth year]  Referred by: [name], FNP Reason for referral : Care Coordination (RQ Initial CC RN Call - Cardiac/CT )   [name] is a 44 y.o. year old female who is a primary care patient of [name], FNP. The CCM team was consulted for assistance with chronic disease management and care coordination needs related to Chest Pain, unspecified; Obesity; Dyspnea on Exertion; Chronic right shoulder pain; fatigue, unspecified; Vitamin D, unspecified   Review of patient status, including review of consultants reports, relevant laboratory and other test results, and collaboration with appropriate care team members and the patient's provider was performed as part of comprehensive patient evaluation and provision of chronic care management services.    SDOH (Social Determinants of Health) assessments performed: Yes See Care Plan activities for detailed interventions related to SDOH   Placed initial CCM RN CC outbound call to patient to assess for CM RN needs, a care plan was established.     Medications: Outpatient Encounter Medications as of 09/27/2019  Medication Sig  . albuterol (VENTOLIN HFA) 108 (90 Base) MCG/ACT inhaler Inhale 2 puffs into the lungs every 6 (six) hours as needed for wheezing or shortness of breath.  . fluticasone furoate-vilanterol (BREO ELLIPTA) 100-25 MCG/INH AEPB Inhale 1 puff into the lungs daily.  . metoprolol tartrate (LOPRESSOR) 100 MG tablet TAKE 2 HOURS PRIOR TO CT SCAN  . pantoprazole (PROTONIX) 40 MG tablet Take 1 tablet (40 mg total) by mouth 2 (two) times daily.  . sucralfate (CARAFATE) 1 g tablet Take 1 tablet (1 g total) by mouth every 6 (six) hours. Make a slurry by slowly dissolving 1 tablet in 1 Tablespoon of distilled water before taking   No facility-administered encounter medications on file as of 09/27/2019.     Objective:  Lab Results    Component Value Date   HGBA1C 5.7 (H) 06/17/2019   HGBA1C 5.1 12/24/2017   HGBA1C 5.1 06/16/2017   Lab Results  Component Value Date   LDLCALC 108 (H) 06/17/2019   CREATININE 0.94 08/15/2019   BP Readings from Last 3 Encounters:  09/24/19 120/80  08/30/19 122/72  08/25/19 129/79    Goals Addressed      Patient Stated   . "I would like to get my Vitamin D level up to normal limits" (pt-stated)       CARE PLAN ENTRY (see longitudinal plan of care for additional care plan information)  Current Barriers:  [name] Knowledge Deficits related to disease education and support for treatment and maintenance of Vitamin D  . Chronic Disease Management support and education needs related to Chest Pain, unspecified; Obesity; Dyspnea on Exertion; Chronic right shoulder pain; fatigue, unspecified; Vitamin D, unspecified   Nurse Case Manager Clinical Goal(s):  [name] Over the next 90 days, patient will work with the CCM RN CM and PCP to address needs related to disease education and support to improve Self Health management of Vitamin D deficiency   CCM RN CM Interventions:  09/28/19 call completed with patient  . Inter-disciplinary care team collaboration (see longitudinal plan of care) . Evaluation of current treatment plan related to 90 and patient's adherence to plan as established by provider. . Provided education to patient re: potential causes of Vitamin D deficiency and the effects this can have on your health, treatment and ongoing monitoring of this condition  . Reviewed medications with patient and discussed patient is taking a  supplement as prescribed by PCP, reports adherence w/o noted SE  . Discussed plans with patient for ongoing care management follow up and provided patient with direct contact information for care management team . Provided patient with printed  educational materials related to Vitamin D deficiency   Patient Self Care Activities:  . Self administers medications as  prescribed . Attends all scheduled provider appointments . Calls pharmacy for medication refills . Performs ADL's independently . Performs IADL's independently . Calls provider office for new concerns or questions  Initial goal documentation     . "to find out what's causing my heart palpitations" (pt-stated)       CARE PLAN ENTRY (see longitudinal plan of care for additional care plan information)  Current Barriers:  [name] Knowledge Deficits related to evaluation and treatment of Cardiac symptoms including, chest pain, unspecified, Palpitations, Dyspnea on Exertion . Chronic Disease Management support and education needs related to Chest Pain, unspecified; Obesity; Dyspnea on Exertion; Chronic right shoulder pain; fatigue, unspecified; Vitamin D, unspecified   Nurse Case Manager Clinical Goal(s):  [name] Over the next 90 days, patient will work with the CCM RN CM and PCP  to address needs related to evaluate and treat persistent Cardiac symptoms including Chest pain, unspecified , Palpitations and Dyspnea on Exertion   CCM RN CM Interventions:  09/28/19 call completed with patient  . Inter-disciplinary care team collaboration (see longitudinal plan of care) . Evaluation of current treatment plan related to persistent Cardiac symptoms including Chest Pain, unspecified; Palpitations; and Dyspnea on Exertion and patient's adherence to plan as established by provider . Determined patient has been experiencing persistent episodes of irregular tachycardia that "come and go"; other associated symptoms include, Chest pain, dizziness or lightheadedness, and dyspnea with stair climbing and or with exertion; these symptoms started "several years ago" . Determined patient is established with Dr. Duke Salvia and [name] and that all testing completed has been negative . Determined patient has been tracking some of the tachycardic episodes on her smart watch but has never worn a Holter monitor that she can  recall . Determined [name] ordered a CTA on 08/30/19 that has not been scheduled due to high demand for this type of testing . Determined patient has family history of Cardiac disease to include her maternal grandmother suffered from several heart attacks and her estranged father may have suffered from Atrial fibrillation and passes away about 1 year ago after having several strokes . Reviewed medications with patient and discussed indication, dosage and frequency of all prescribed medications; patient is not currently taking Breo as prescribed by Pulmonology, she states, "I don't like the way it makes me feel" . Collaborated with Elnita Maxwell from Dr. Ludwig Clarks office regarding has not received a call regarding the scheduling of her Cardia CT; Advised patient continues to have palpitations and is describing symptoms suggestive of Atrial fibrillation; Advised patient has not been ordered to wear an event monitor and is tracking the tachycardia with her Apple Watch but is not finding this to be helpful since she is already aware of the fast irregular heart beat when it occurs . Determined [name] is agreeable to ordering a 30 day event monitor and the office will contact the patient with the details, the monitor will be mailed to her home with instructions for use . Discussed plans with patient for ongoing care management follow up and provided patient with direct contact information for care management team  Patient Self Care Activities:  . Self administers  medications as prescribed . Attends all scheduled provider appointments . Calls pharmacy for medication refills . Performs ADL's independently . Performs IADL's independently . Calls provider office for new concerns or questions  Initial goal documentation     . "to keep my BP under good control" (pt-stated)       CARE PLAN ENTRY (see longitudinal plan of care for additional care plan information)  Current Barriers:  [name] Knowledge  Deficits related to disease process and Self Health management of HTN . Chronic Disease Management support and education needs related to Chest Pain, unspecified; Obesity; Dyspnea on Exertion; Chronic right shoulder pain; fatigue, unspecified; Vitamin D, unspecified   Nurse Case Manager Clinical Goal(s):  [name] Over the next 90 days, patient will work with the [place] and PCP  to address needs related to disease education and support to help improve Self Health management of HTN   CCM RN CM Interventions:  09/28/19 call completed with patient  Inter-disciplinary care team collaboration (see longitudinal plan of care) . Evaluation of current treatment plan related to HTN and patient's adherence to plan as established by provider. . Provided education to patient re: disease process and how to Self management; Educated on target BP of <130/80 . Reviewed medications with patient and discussed indications, dosage and frequency of prescribed medications, patient is not currently taking Breo for Asthma, no other discrepancies identified, pt reports adherence  . Discussed plans with patient for ongoing care management follow up and provided patient with direct contact information for care management team . Provided patient with printed educational materials related to What is High Blood Pressure?; Why Should I Lower Sodium?; African-Americans and High Blood Pressure; High Blood Pressure and Stroke; Life's Simple 7  Patient Self Care Activities:  . Self administers medications as prescribed . Attends all scheduled provider appointments . Calls pharmacy for medication refills . Performs ADL's independently . Performs IADL's independently . Calls provider office for new concerns or questions  Initial goal documentation       Other   . COMPLETED: Assist with Care Coordination needs and Chronic Care Management       CARE PLAN ENTRY (see longtitudinal plan of care for additional care plan  information)   Current Barriers:  . Chronic Disease Management support, education, and care coordination needs related to  Impaired Cardiac dysfunction (palpitations)   Case Manager Clinical Goal(s):  [name] Over the next 30 days, patient will work with the CCM team to address needs related to Care Coordination in patient with Impaired Cardiac dysfunction (palpitations)   Interventions:  . Collaborated with BSW to initiate plan of care to address needs related to  completion of Cardiac CT  in patient with Impaired Cardiac dysfunction (palpitations)   Patient Self Care Activities:  . Self administers medications as prescribed . Attends all scheduled provider appointments . Calls pharmacy for medication refills . Calls provider office for new concerns or questions  Initial goal documentation        Plan:   Telephone follow up appointment with care management team member scheduled for: 10/05/19  [name], RN, BSN, CCM Care Management Coordinator [place]  Direct Phone: [phone]

## 2019-10-05 ENCOUNTER — Other Ambulatory Visit: Payer: Self-pay

## 2019-10-05 ENCOUNTER — Ambulatory Visit: Payer: Self-pay

## 2019-10-05 ENCOUNTER — Telehealth: Payer: Self-pay

## 2019-10-05 ENCOUNTER — Encounter: Payer: Self-pay | Admitting: Orthopaedic Surgery

## 2019-10-05 ENCOUNTER — Ambulatory Visit (INDEPENDENT_AMBULATORY_CARE_PROVIDER_SITE_OTHER): Payer: 59 | Admitting: Orthopaedic Surgery

## 2019-10-05 DIAGNOSIS — R079 Chest pain, unspecified: Secondary | ICD-10-CM

## 2019-10-05 DIAGNOSIS — R0600 Dyspnea, unspecified: Secondary | ICD-10-CM

## 2019-10-05 DIAGNOSIS — G8929 Other chronic pain: Secondary | ICD-10-CM

## 2019-10-05 DIAGNOSIS — M25511 Pain in right shoulder: Secondary | ICD-10-CM | POA: Diagnosis not present

## 2019-10-05 DIAGNOSIS — R0609 Other forms of dyspnea: Secondary | ICD-10-CM

## 2019-10-05 DIAGNOSIS — R5383 Other fatigue: Secondary | ICD-10-CM

## 2019-10-05 DIAGNOSIS — E559 Vitamin D deficiency, unspecified: Secondary | ICD-10-CM

## 2019-10-05 DIAGNOSIS — R0789 Other chest pain: Secondary | ICD-10-CM

## 2019-10-05 DIAGNOSIS — E669 Obesity, unspecified: Secondary | ICD-10-CM

## 2019-10-05 MED ORDER — ESOMEPRAZOLE MAGNESIUM 40 MG PO CPDR: 40.0000 mg | DELAYED_RELEASE_CAPSULE | Freq: Every day | ORAL | 6 refills | Status: DC

## 2019-10-05 NOTE — Progress Notes (Signed)
Office Visit Note   Patient: [name]           Date of Birth: Jun 22, 1975           MRN: [id number] Visit Date: 10/05/2019              Requested by: [name], FNP [address] [place],  [place] [address] PCP: [name], FNP   Assessment & Plan: Visit Diagnoses:  1. Chronic right shoulder pain     Plan: Impression is chronic right shoulder pain status post right shoulder arthroscopy 2 years ago.  With temporary relief from physical therapy and steroid Dosepak we will need to obtain MRI of the right shoulder to rule out structural abnormalities.  Intraoperative findings at that time did reveal bursal sided partial-thickness tear.  My concern is that this may have progressed.  We will see the patient after the MRI.  Follow-Up Instructions: Return if symptoms worsen or fail to improve.   Orders:  No orders of the defined types were placed in this encounter.  No orders of the defined types were placed in this encounter.     Procedures: No procedures performed   Clinical Data: No additional findings.   Subjective: Chief Complaint  Patient presents with  . Neck - Pain    [name] returns today for continued right shoulder and right-sided neck pain.  She continues to have pain despite physical therapy and the steroid taper.  The steroid taper worked temporarily but the pain came back after she was finished with this.  She is 2 years status post right shoulder arthroscopy with biceps tendon tenotomy and debridement with Dr. Everardo Pacific.   Review of Systems  Constitutional: Negative.   HENT: Negative.   Eyes: Negative.   Respiratory: Negative.   Cardiovascular: Negative.   Endocrine: Negative.   Musculoskeletal: Negative.   Neurological: Negative.   Hematological: Negative.   Psychiatric/Behavioral: Negative.   All other systems reviewed and are negative.    Objective: Vital Signs: LMP 12/27/2003   Physical Exam Vitals and nursing note  reviewed.  Constitutional:      Appearance: She is well-developed.  Pulmonary:     Effort: Pulmonary effort is normal.  Skin:    General: Skin is warm.     Capillary Refill: Capillary refill takes less than 2 seconds.  Neurological:     Mental Status: She is alert and oriented to person, place, and time.  Psychiatric:        Behavior: Behavior normal.        Thought Content: Thought content normal.        Judgment: Judgment normal.     Ortho Exam Right shoulder exam shows fully healed surgical scars.  She has full range of motion.  Strength is slightly limited secondary to pain but overall manual muscle testing shows no deficits. Specialty Comments:  No specialty comments available.  Imaging: No results found.   PMFS History: Patient Active Problem List   Diagnosis Date Noted  . Generalized anxiety disorder 04/08/2018  . Atypical chest pain   . Regurgitation of food   . Laryngopharyngeal reflux (LPR) 07/30/2017  . Throat soreness 07/30/2017  . ANA positive 12/30/2016  . Gastroesophageal reflux disease 06/27/2016  . Disturbance of skin sensation 04/27/2014  . Dizziness and giddiness 03/14/2014  . Headache, migraine 03/14/2014  . Myalgia and myositis, unspecified 06/07/2013  . Nausea and vomiting in adult 08/22/2012  . Viral syndrome 08/22/2012  . Pelvic pain in female  12/18/2010  . PID (acute pelvic inflammatory disease) 12/18/2010  . ANEMIA-IRON DEFICIENCY 10/15/2006  . HERPES, GENITAL NOS 09/30/2006  . OBESITY NOS 09/30/2006  . DISORDER, NONORGANIC SLEEP NOS 09/30/2006  . CONSTIPATION NOS 09/30/2006  . NEURITIS, LUMBOSACRAL NOS 09/30/2006  . HX, PERSONAL, MENTAL DISORDER NOS 09/30/2006   Past Medical History:  Diagnosis Date  . Allergy   . Anemia   . Angio-edema   . Anxiety   . Arthritis   . Constipation   . GERD (gastroesophageal reflux disease)   . Hx of endometriosis   . Insomnia   . Macromastia 07/2011  . Migraine   . Urticaria     Family History   Problem Relation Age of Onset  . Diabetes Mother   . Hypertension Mother   . Thyroid disease Sister   . Heart attack Maternal Grandmother   . Stroke Maternal Grandfather   . Asthma Son   . Asthma Daughter   . Colon cancer Neg Hx   . Colon polyps Neg Hx   . Esophageal cancer Neg Hx   . Rectal cancer Neg Hx   . Stomach cancer Neg Hx     Past Surgical History:  Procedure Laterality Date  . 24 HOUR PH STUDY N/A 01/12/2018   Procedure: 24 HOUR PH STUDY;  Surgeon: Napoleon Form, MD;  Location: [facility];  Service: Endoscopy;  Laterality: N/A;  . ABDOMINAL HYSTERECTOMY  12/12/2003  . ADENOIDECTOMY  05/2011  . ANKLE ARTHROSCOPY  12/29/2007   right; with extensive debridement  . BLADDER NECK RECONSTRUCTION  01/14/2011   Procedure: BLADDER NECK REPAIR;  Surgeon: Reva Bores, MD;  Location: [facility];  Service: Gynecology;  Laterality: N/A;  Laparoscopic Repair of Incidental Cystotomy  . BREAST REDUCTION SURGERY  08/05/2011   Procedure: MAMMARY REDUCTION  (BREAST);  Surgeon: Louisa Second, MD;  Location: [facility];  Service: Plastics;  Laterality: Bilateral;  bilateral  . CESAREAN SECTION  12/29/2000; 1994  . DILATION AND CURETTAGE OF UTERUS  07/08/2003   open laparoscopy  . ESOPHAGEAL MANOMETRY N/A 01/12/2018   Procedure: ESOPHAGEAL MANOMETRY (EM);  Surgeon: Napoleon Form, MD;  Location: [facility];  Service: Endoscopy;  Laterality: N/A;  . GIVENS CAPSULE STUDY N/A 06/23/2017   Procedure: GIVENS CAPSULE STUDY;  Surgeon: Benancio Deeds, MD;  Location: [facility];  Service: Gastroenterology;  Laterality: N/A;  . LAPAROSCOPIC SALPINGOOPHERECTOMY  01/14/2011   left  . PH IMPEDANCE STUDY N/A 01/12/2018   Procedure: PH IMPEDANCE STUDY;  Surgeon: Napoleon Form, MD;  Location: [facility];  Service: Endoscopy;  Laterality: N/A;  . REPAIR PERONEAL TENDONS ANKLE  01/03/2010   repair right subluxing peroneal tendons  . RESECTION DISTAL CLAVICAL Right 09/25/2017    Procedure: RESECTION DISTAL CLAVICAL;  Surgeon: Bjorn Pippin, MD;  Location: [facility];  Service: Orthopedics;  Laterality: Right;  . RIGHT OOPHORECTOMY     with lysis of adhesions  . SHOULDER ACROMIOPLASTY Right 09/25/2017   Procedure: SHOULDER ACROMIOPLASTY;  Surgeon: Bjorn Pippin, MD;  Location: [facility];  Service: Orthopedics;  Laterality: Right;  . SHOULDER ARTHROSCOPY WITH DEBRIDEMENT AND BICEP TENDON REPAIR Right 09/25/2017   Procedure: SHOULDER ARTHROSCOPY WITH DEBRIDEMENT AND BICEP TENDON REPAIR;  Surgeon: Bjorn Pippin, MD;  Location: [facility];  Service: Orthopedics;  Laterality: Right;  . SHOULDER ARTHROSCOPY WITH ROTATOR CUFF REPAIR Right 09/25/2017   Procedure: SHOULDER ARTHROSCOPY WITH ROTATOR CUFF REPAIR;  Surgeon: Bjorn Pippin, MD;  Location: [facility];  Service: Orthopedics;  Laterality: Right;  . TONSILLECTOMY  as a child  . TUBAL LIGATION  12/29/2000   Social History   Occupational History    Employer: OTHER    Comment: [employer]  Tobacco Use  . Smoking status: Never Smoker  . Smokeless tobacco: Never Used  Substance and Sexual Activity  . Alcohol use: Yes    Comment: occasional   . Drug use: No  . Sexual activity: Not on file

## 2019-10-05 NOTE — Telephone Encounter (Signed)
Armbruster, Willaim Rayas, MD sent to Annett Fabian, RN  Pressley Tadesse if she feels nexium worked better for her we can switch protonix 40mg  BID to nexium 40mg  BID and see how she does. Details of her case in my last note, I'm not sure this will make a large difference for her but certainly can try it if that is what she wants to do. Thanks

## 2019-10-05 NOTE — Patient Instructions (Signed)
Visit Information  Goals Addressed            This Visit's Progress   . Collaborate with RN Care Manager to perform approrpiate assessments to determine care management and care coordination needs       CARE PLAN ENTRY (see longitudinal plan of care for additional care plan information)  Current Barriers:  . Four ED visits over the last 30 days . Patient experiencing heart palpitations without known cause . Limited knowledge of how to react to symptoms of heart palpitations due to lack of clinical diagnosis   Social Work Clinical Goal(s):  [name] Over the next 20 days the patient will receive imaging studies ordered by [name] on 08/30/19 . Over the next 60 days the patient will work with embedded care management team to establish an individualized plan of care related to the management of patients ongoing medical concerns Goal Met  SW Interventions: Completed 10/05/19 . Chart review performed to confirm patient engaged with RN Care Manager- see individual goals created by RN Care Manager . Noted patient cardiologist has ordered a 30 days Holter Monitor for the patient o Patient reports she has yet to receive device . Encouraged patient to contact care management team with ongoing care management and care coordination needs . Inbound call received from the patient whom reports she has just left Orthopedic office o Patient reports plan to have MRI to right shoulder due to ongoing pain  o Patient reports she has undergone PT and a round of Steroids without relief o Patient reports she underwent surgery to right shoulder approximately two years ago but is continuing to have pain after continued use  o Collaboration with RN Care Manager to report patient pain and plan for shoulder MRI . Informed by the patient she felt her heart racing over the weekend which caused her to stay awake most of the night o "My watch said it was around 120 beats"  o Patient reports heart rate kept fluctuating  and stated "even once it was back to 80 I still felt like it was off beat" o Discussed plans for SW to contact patient over the next 2 days to assess status of Holter Monitor delivery o Advised patient SW would assist in calling company on Wednesday for tracking number if equipment not yet received . Determined patient has yet to receive cal from radiology office to schedule cardiac CT o Collaboration with [facility] who requests patient contact her to conduct a joint call to patient insurance regarding test authorization o Provided patient with direct contact number to RN Care Manager  Completed 09/21/19 . Inbound call received from the patient in response to voice message left by SW . Reviewed instructions from [name] office regarding importance of contacting [place] with new onset or worsening of symptoms . Confirmed the patient is knowledgeable of how to access Apple 6 EKG feature . Scheduled follow up over the next two weeks  Completed 09/20/19 . Voice message received from [facility] with [name] office indicating [name] felt with atypical chest pain the patient would be okay to wait for radiology to perform test as originally ordered o Requested the patient contact [place] with new onset of symptoms o Requested the patient use Apple 6 watch EKG feature to monitor heart rate when palpitations occur and report back to provider office . Unsuccessful outbound call placed to the patient to review instructions from [employer] . Collaboration with [name], [place] regarding patient instructions  Completed 09/17/19 . Inter-disciplinary care team collaboration (see longitudinal plan of care) . Inbound call received from the patient in response to voice message left by SW . Patient interviewed and appropriate assessments performed o Determined the patient has been seen in the ED x 4 over the last 30 days o The patient reports seen by Cardiologist, Dr [name] on 4/26  whom ordered an MRI and CAT scan to investigate cause of patient heart palpitations . Determined the patient has yet to be contacted by imaging location to have tests performed o The patient reports she has been in contact with her health plan whom reports they have yet to receive a request for imaging studies o Patient reports she has contacted Radiology department in which referral was sent whom reports a backlog of orders . Discussed the patient continues to have symptoms associated with atypical chest pain o The patient reports ongoing SOB and dizziness o The patient is awoken by rapid heart rate on occasion with averages of 160-180 BPM when this occurs o The patient reports she tries to relax while episode occurs o The patient stated "I did everything I was supposed to do but nothing is happening" . Advised the patient SW would outreach clinical team to address barriers related to imaging studies . Encouraged the patient to contact her cardiology team and primary care provider with ongoing symptoms as needed . Provided direct contact number to SW for the patient to contact SW directly . Collaboration with [name], RN team lead and [name], embedded RN care manager regarding patient barriers to obtain imaging studies . Collaboration with [name] 's office to report ongoing symptoms reported by the patient as well as barriers associated with ordered imaging studies o Requested follow up to determine if orders should be marked urgent or if the patient could receive imaging studies at a different location . Scheduled follow up call to the patient over the next 5 days  Patient Self Care Activities:  . Patient verbalizes understanding of plan to work with embedded care management team to address ongoing care coordination needs . Self administers medications as prescribed . Performs ADL's independently . Performs IADL's independently . Calls provider office for new concerns or  questions  Please see past updates related to this goal by clicking on the "Past Updates" button in the selected goal         The care management team will reach out to the patient again over the next 2 days.   Daneen Schick, BSW, CDP Social Worker, Certified Dementia Practitioner [place] [facility] [phone]

## 2019-10-05 NOTE — Chronic Care Management (AMB) (Signed)
Care Management   Follow Up Note   10/05/2019 Name: LIAN POUNDS MRN: [id number] DOB: 1976-01-16  Referred by: [name], FNP Reason for referral : Care Coordination   Nyjae S Golonka is a 44 y.o. year old female who is a primary care patient of [name], [place]. The care management team was consulted for assistance with care management and care coordination needs.    Review of patient status, including review of consultants reports, relevant laboratory and other test results, and collaboration with appropriate care team members and the patient's provider was performed as part of comprehensive patient evaluation and provision of chronic care management services.    SDOH (Social Determinants of Health) assessments performed: No See Care Plan activities for detailed interventions related to [facility])     Advanced Directives: See Care Plan and Vynca application for related entries.   Goals Addressed            This Visit's Progress   . Collaborate with RN Care Manager to perform approrpiate assessments to determine care management and care coordination needs   On track    [place] (see longitudinal plan of care for additional care plan information)  Current Barriers:  . Four ED visits over the last 30 days . Patient experiencing heart palpitations without known cause . Limited knowledge of how to react to symptoms of heart palpitations due to lack of clinical diagnosis   Social Work Clinical Goal(s):  [name] Over the next 20 days the patient will receive imaging studies ordered by [name] on 08/30/19 . Over the next 60 days the patient will work with embedded care management team to establish an individualized plan of care related to the management of patients ongoing medical concerns Goal Met  SW Interventions: Completed 10/05/19 . Chart review performed to confirm patient engaged with RN Care Manager- see individual goals created by RN Care Manager . Noted patient  cardiologist has ordered a 30 days Holter Monitor for the patient o Patient reports she has yet to receive device . Encouraged patient to contact care management team with ongoing care management and care coordination needs  Completed 09/21/19 . Inbound call received from the patient in response to voice message left by SW . Reviewed instructions from [name] office regarding importance of contacting [place] with new onset or worsening of symptoms . Confirmed the patient is knowledgeable of how to access Apple 6 EKG feature . Scheduled follow up over the next two weeks  Completed 09/20/19 . Voice message received from [facility] with [name] office indicating [name] felt with atypical chest pain the patient would be okay to wait for radiology to perform test as originally ordered o Requested the patient contact [place] with new onset of symptoms o Requested the patient use Apple 6 watch EKG feature to monitor heart rate when palpitations occur and report back to provider office . Unsuccessful outbound call placed to the patient to review instructions from [employer] . Collaboration with [name], [place] regarding patient instructions  Completed 09/17/19 . Inter-disciplinary care team collaboration (see longitudinal plan of care) . Inbound call received from the patient in response to voice message left by SW . Patient interviewed and appropriate assessments performed o Determined the patient has been seen in the ED x 4 over the last 30 days o The patient reports seen by Cardiologist, Dr [name] on 4/26 whom ordered an MRI and CAT scan to investigate cause of patient heart palpitations . Determined  the patient has yet to be contacted by imaging location to have tests performed o The patient reports she has been in contact with her health plan whom reports they have yet to receive a request for imaging studies o Patient reports she has contacted Radiology department in  which referral was sent whom reports a backlog of orders . Discussed the patient continues to have symptoms associated with atypical chest pain o The patient reports ongoing SOB and dizziness o The patient is awoken by rapid heart rate on occasion with averages of 160-180 BPM when this occurs o The patient reports she tries to relax while episode occurs o The patient stated "I did everything I was supposed to do but nothing is happening" . Advised the patient SW would outreach clinical team to address barriers related to imaging studies . Encouraged the patient to contact her cardiology team and primary care provider with ongoing symptoms as needed . Provided direct contact number to SW for the patient to contact SW directly . Collaboration with [name], RN team lead and [name], embedded RN care manager regarding patient barriers to obtain imaging studies . Collaboration with [name] 's office to report ongoing symptoms reported by the patient as well as barriers associated with ordered imaging studies o Requested follow up to determine if orders should be marked urgent or if the patient could receive imaging studies at a different location . Scheduled follow up call to the patient over the next 5 days  Patient Self Care Activities:  . Patient verbalizes understanding of plan to work with embedded care management team to address ongoing care coordination needs . Self administers medications as prescribed . Performs ADL's independently . Performs IADL's independently . Calls provider office for new concerns or questions  Please see past updates related to this goal by clicking on the "Past Updates" button in the selected goal          Patient reports she is unable to complete entire call at this time but will call SW back later today.  Daneen Schick, BSW, CDP Social Worker, Certified Dementia Practitioner [place] [facility] [place] [facility] [phone]

## 2019-10-05 NOTE — Addendum Note (Signed)
Addended by: Albertina Parr on: 10/05/2019 04:00 PM   Modules accepted: Orders

## 2019-10-05 NOTE — Chronic Care Management (AMB) (Signed)
Care Management    Social Work Follow Up Note  10/05/2019 Name: [name] MRN: [id number] DOB: Mar 26, 1976  [name] is a 44 y.o. year old female who is a primary care patient of [name], [place]. The CCM team was consulted for assistance with care coordination.   Review of patient status, including review of consultants reports, other relevant assessments, and collaboration with appropriate care team members and the patient's provider was performed as part of comprehensive patient evaluation and provision of chronic care management services.    SDOH (Social Determinants of Health) assessments performed: No    Outpatient Encounter Medications as of 10/05/2019  Medication Sig  . albuterol (VENTOLIN HFA) 108 (90 Base) MCG/ACT inhaler Inhale 2 puffs into the lungs every 6 (six) hours as needed for wheezing or shortness of breath.  . esomeprazole (NEXIUM) 40 MG capsule Take 1 capsule (40 mg total) by mouth daily at 12 noon.  . fluticasone furoate-vilanterol (BREO ELLIPTA) 100-25 MCG/INH AEPB Inhale 1 puff into the lungs daily.  . Fluticasone-Salmeterol (ADVAIR DISKUS) 250-50 MCG/DOSE AEPB Advair Diskus 250 mcg-50 mcg/dose powder for inhalation  . HYDROcodone-acetaminophen (NORCO/VICODIN) 5-325 MG tablet   . pantoprazole (PROTONIX) 40 MG tablet Take 1 tablet (40 mg total) by mouth 2 (two) times daily.  [name] zolpidem (AMBIEN CR) 12.5 MG CR tablet Take 12.5 mg by mouth at bedtime as needed.  . [DISCONTINUED] cetirizine (ZYRTEC) 10 MG tablet Take 1 tablet (10 mg total) by mouth daily.  . [DISCONTINUED] ferrous sulfate 325 (65 FE) MG EC tablet Take 1 tablet (325 mg total) by mouth 2 (two) times daily with a meal.  . [DISCONTINUED] loratadine (CLARITIN) 10 MG tablet Take 1 tablet (10 mg total) by mouth daily.   No facility-administered encounter medications on file as of 10/05/2019.     Goals Addressed            This Visit's Progress   . Collaborate with RN Care Manager to perform  approrpiate assessments to determine care management and care coordination needs       CARE PLAN ENTRY (see longitudinal plan of care for additional care plan information)  Current Barriers:  . Four ED visits over the last 30 days . Patient experiencing heart palpitations without known cause . Limited knowledge of how to react to symptoms of heart palpitations due to lack of clinical diagnosis   Social Work Clinical Goal(s):  [name] Over the next 20 days the patient will receive imaging studies ordered by [name] on 08/30/19 . Over the next 60 days the patient will work with embedded care management team to establish an individualized plan of care related to the management of patients ongoing medical concerns Goal Met  SW Interventions: Completed 10/05/19 . Chart review performed to confirm patient engaged with RN Care Manager- see individual goals created by RN Care Manager . Noted patient cardiologist has ordered a 30 days Holter Monitor for the patient o Patient reports she has yet to receive device . Encouraged patient to contact care management team with ongoing care management and care coordination needs . Inbound call received from the patient whom reports she has just left Orthopedic office o Patient reports plan to have MRI to right shoulder due to ongoing pain  o Patient reports she has undergone PT and a round of Steroids without relief o Patient reports she underwent surgery to right shoulder approximately two years ago but is continuing to have pain after continued use  o Collaboration with  RN Care Manager to report patient pain and plan for shoulder MRI . Informed by the patient she felt her heart racing over the weekend which caused her to stay awake most of the night o "My watch said it was around 120 beats"  o Patient reports heart rate kept fluctuating and stated "even once it was back to 80 I still felt like it was off beat" o Discussed plans for SW to contact patient over  the next 2 days to assess status of Holter Monitor delivery o Advised patient SW would assist in calling company on Wednesday for tracking number if equipment not yet received . Determined patient has yet to receive cal from radiology office to schedule cardiac CT o Collaboration with [place] who requests patient contact her to conduct a joint call to patient insurance regarding test authorization o Provided patient with direct contact number to RN Care Manager  Completed 09/21/19 . Inbound call received from the patient in response to voice message left by SW . Reviewed instructions from [name] office regarding importance of contacting [place] with new onset or worsening of symptoms . Confirmed the patient is knowledgeable of how to access Apple 6 EKG feature . Scheduled follow up over the next two weeks  Completed 09/20/19 . Voice message received from [facility] with [name] office indicating [name] felt with atypical chest pain the patient would be okay to wait for radiology to perform test as originally ordered o Requested the patient contact [place] with new onset of symptoms o Requested the patient use Apple 6 watch EKG feature to monitor heart rate when palpitations occur and report back to provider office . Unsuccessful outbound call placed to the patient to review instructions from [employer] . Collaboration with [name], [place] regarding patient instructions  Completed 09/17/19 . Inter-disciplinary care team collaboration (see longitudinal plan of care) . Inbound call received from the patient in response to voice message left by SW . Patient interviewed and appropriate assessments performed o Determined the patient has been seen in the ED x 4 over the last 30 days o The patient reports seen by Cardiologist, Dr [name] on 4/26 whom ordered an MRI and CAT scan to investigate cause of patient heart palpitations . Determined the patient has yet to be  contacted by imaging location to have tests performed o The patient reports she has been in contact with her health plan whom reports they have yet to receive a request for imaging studies o Patient reports she has contacted Radiology department in which referral was sent whom reports a backlog of orders . Discussed the patient continues to have symptoms associated with atypical chest pain o The patient reports ongoing SOB and dizziness o The patient is awoken by rapid heart rate on occasion with averages of 160-180 BPM when this occurs o The patient reports she tries to relax while episode occurs o The patient stated "I did everything I was supposed to do but nothing is happening" . Advised the patient SW would outreach clinical team to address barriers related to imaging studies . Encouraged the patient to contact her cardiology team and primary care provider with ongoing symptoms as needed . Provided direct contact number to SW for the patient to contact SW directly . Collaboration with [name], RN team lead and [name], embedded RN care manager regarding patient barriers to obtain imaging studies . Collaboration with [name] 's office to report ongoing symptoms reported by the  patient as well as barriers associated with ordered imaging studies o Requested follow up to determine if orders should be marked urgent or if the patient could receive imaging studies at a different location . Scheduled follow up call to the patient over the next 5 days  Patient Self Care Activities:  . Patient verbalizes understanding of plan to work with embedded care management team to address ongoing care coordination needs . Self administers medications as prescribed . Performs ADL's independently . Performs IADL's independently . Calls provider office for new concerns or questions  Please see past updates related to this goal by clicking on the "Past Updates" button in the selected goal            Follow Up Plan: SW will follow up with patient by phone over the next two days.   Daneen Schick, BSW, CDP Social Worker, Certified Dementia Practitioner [place] [facility] [facility] [phone]

## 2019-10-06 ENCOUNTER — Other Ambulatory Visit: Payer: Self-pay

## 2019-10-06 ENCOUNTER — Other Ambulatory Visit: Payer: Self-pay | Admitting: *Deleted

## 2019-10-06 ENCOUNTER — Ambulatory Visit (HOSPITAL_COMMUNITY)
Admission: EM | Admit: 2019-10-06 | Discharge: 2019-10-06 | Disposition: A | Discharge status: Home / Self Care | Payer: 59 | Attending: Emergency Medicine | Admitting: Emergency Medicine

## 2019-10-06 DIAGNOSIS — M1990 Unspecified osteoarthritis, unspecified site: Secondary | ICD-10-CM | POA: Insufficient documentation

## 2019-10-06 DIAGNOSIS — R05 Cough: Secondary | ICD-10-CM | POA: Insufficient documentation

## 2019-10-06 DIAGNOSIS — E669 Obesity, unspecified: Secondary | ICD-10-CM | POA: Diagnosis not present

## 2019-10-06 DIAGNOSIS — Z79899 Other long term (current) drug therapy: Secondary | ICD-10-CM | POA: Insufficient documentation

## 2019-10-06 DIAGNOSIS — K219 Gastro-esophageal reflux disease without esophagitis: Secondary | ICD-10-CM | POA: Diagnosis not present

## 2019-10-06 DIAGNOSIS — J019 Acute sinusitis, unspecified: Secondary | ICD-10-CM | POA: Diagnosis not present

## 2019-10-06 DIAGNOSIS — Z20822 Contact with and (suspected) exposure to covid-19: Secondary | ICD-10-CM | POA: Diagnosis not present

## 2019-10-06 DIAGNOSIS — R0602 Shortness of breath: Secondary | ICD-10-CM | POA: Insufficient documentation

## 2019-10-06 MED ORDER — PREDNISONE 20 MG PO TABS: 40.0000 mg | ORAL_TABLET | Freq: Every day | ORAL | 0 refills | Status: AC

## 2019-10-06 MED ORDER — BENZONATATE 200 MG PO CAPS: 200.0000 mg | ORAL_CAPSULE | Freq: Three times a day (TID) | ORAL | 0 refills | Status: DC | PRN

## 2019-10-06 MED ORDER — DOXYCYCLINE HYCLATE 100 MG PO CAPS: 100.0000 mg | ORAL_CAPSULE | Freq: Two times a day (BID) | ORAL | 0 refills | Status: DC

## 2019-10-06 NOTE — Discharge Instructions (Signed)
COVID test pending- monitor MyChart for results Continue claritin, flonase- may try mucinex or nasacort as alternatives Tessalon/benzonatate as needed for cough Begin course of prednisone- 40 mg daily x 5 days- take with food in morning Doxycycline twice daily x 1 week  Follow up if not improving or worsening

## 2019-10-06 NOTE — ED Triage Notes (Signed)
Cough and congestion, seen by provider prior to this nurse

## 2019-10-06 NOTE — Telephone Encounter (Signed)
Left message for [name], aware CT scan is scheduled for 10-08-19.

## 2019-10-06 NOTE — Patient Instructions (Signed)
Visit Information  Goals Addressed      Patient Stated   . "I feel like something gets stuck in my throat when I swallow" (pt-stated)       CARE PLAN ENTRY (see longitudinal plan of care for additional care plan information)  Current Barriers:  [name]. Knowledge Deficits related to evaluation and treatment of dysphasia  . Chronic Disease Management support and education needs related to Chest Pain, unspecified; Obesity; Dyspnea on Exertion; Chronic right shoulder pain; fatigue, unspecified; Vitamin D, unspecified, other dysphasia    Nurse Case Manager Clinical Goal(s):  [name]. Over the next 90 days, patient will work with CC RN CM and PCP to address needs related to evaluate and treat symptoms of dysphasia  CCM RN CC Interventions:  09/29/19 call completed with patient  . Inter-disciplinary care team collaboration (see longitudinal plan of care) . Evaluation of current treatment plan related to dysphasia and patient's adherence to plan as established by provider . Determined patient is established with GI MD Dr. Adela LankArmbruster with the most recent OV completed on 08/19/19; Determined the following prior workup is noted;  o Prior workup: o Capsule endoscopy - 06/23/17 - complete study with good prep, no clear cause for iron deficiency o EGD 02/20/2017 - 2cm HH, otherwise normal exam, biopsies negative for HP o Colonoscopy 02/20/2017 - focal mild erythema in the rectum - biopsied showed no evidence of colitis, otherwise normal colon and ileum  o MRI liver / abdomen - 02/10/2017 - focal fatty infiltration, normal biliary tree, otherwise no pathology noted in the abdomen o Historic evaluation: o CT scan neck 12/02/16 - normal o CT angio of chest 12/02/2016 - normal o EGD 08/19/2013 - Dr. [name] - normal esophagus, mild gastritis o Esophageal manometry 2015 - normal o PH study 2015 - normal exam, Demeester score of 5.7, positive symptom index for regurgitation and reflux o EGG normal 2015 o Normal hydrogen  breath test 2015 o Normal gastric emptying scan 2015 o GES - delayed in 2011 - Reglan caused twitching and it was stopped. o GES 11/13/2017 - normal o Manometry / 24 hr pH impedance testing was recommended o Esophageal manometry 01/12/18 - normal o PH impedance study 01/12/18 - Demeester score 9, SAP positively correlates with reflux, good acid suppression on PPI . Reviewed medications with patient and discussed patient is adhering to taking Pantoprazole exactly as prescribed with good effectiveness for GERD, however, this has not improved the feeling of having food "stuck" in her esphagus . Discussed and determined Ms. Haefner f/u with an Endocrinologist to further evaluate an abnormal US of her throat and was advised her Thyroid is normal and per the patient, it was recommended to have a CT scan of her throat to further evaluate her swallowing difficulties . Collaborated with PCP provider Arnette FeltsJanece Moore, FNP via in basket message regarding patient's concerns related to swallowing difficulty and current treatment is not relieving symptoms  . Discussed plans with patient for ongoing care management follow up and provided patient with direct contact information for care management team  Patient Self Care Activities:  . Self administers medications as prescribed . Attends all scheduled provider appointments . Calls pharmacy for medication refills . Performs ADL's independently . Performs IADL's independently . Calls provider office for new concerns or questions  Initial goal documentation     . "I will have an MRI of my right shoulder" (pt-stated)       CARE PLAN ENTRY (see longitudinal plan of care for additional care  plan information)  Current Barriers:  [name] Knowledge Deficits related to evaluation and treatment of chronic right shoulder pain   . Chronic Disease Management support and education needs related to Chest Pain, unspecified; Obesity; Dyspnea on Exertion; Chronic right shoulder pain;  fatigue, unspecified; Vitamin D, unspecified, other dysphasia    Nurse Case Manager Clinical Goal(s):  [name] Over the next 90 days, patient will work with the CCM team and Orthopedic MD to address needs related to evaluate and treat chronic right shoulder pain  CCM RN CC Interventions:  10/05/19 call completed with patient  . Inter-disciplinary care team collaboration (see longitudinal plan of care) . Evaluation of current treatment plan related to chronic right shoulder pain  and patient's adherence to plan as established by provider . Determined patient f/u with Dr. Roda Shutters [facility] today to evaluate and treat her right shoulder pain . Reviewed and discussed MD recommendations for further evaluation/treatment of this problem;  o Assessment & Plan: o Visit Diagnoses:  o 1. o Chronic right shoulder pain   o Plan: Impression is chronic right shoulder pain status post right shoulder arthroscopy 2 years ago.  With temporary relief from physical therapy and steroid Dosepak we will need to obtain MRI of the right shoulder to rule out structural abnormalities.  Intraoperative findings at that time did reveal bursal sided partial-thickness tear.  My concern is that this may have progressed.  We will see the patient after the MRI. o Follow-Up Instructions: Return if symptoms worsen or fail to improve . Determined patient has a good understanding of her prescribed treatment plan  . Mailed printed educational material related "Dry Needling" . Discussed plans with patient for ongoing care management follow up and provided patient with direct contact information for care management team  Patient Self Care Activities:  . Self administers medications as prescribed . Attends all scheduled provider appointments . Calls pharmacy for medication refills . Performs ADL's independently . Performs IADL's independently . Calls provider office for new concerns or questions  Initial goal  documentation     . "I would like to be checked for having an autoimmune condition" (pt-stated)       CARE PLAN ENTRY (see longitudinal plan of care for additional care plan information)  Current Barriers:  [name] Knowledge Deficits related to evaluation and treatment of auto-immune disorder . Chronic Disease Management support and education needs related to Chest Pain, unspecified; Obesity; Dyspnea on Exertion; Chronic right shoulder pain; fatigue, unspecified; Vitamin D, unspecified, other dysphasia    Nurse Case Manager Clinical Goal(s):  [name] Over the next 90 days, patient will work with the CCM team and PCP to address needs related to evaluation for any/all auto-immune condition(s) and treatment recommendations as appropriate for any abnormal findings   CCM RN CC Interventions:  10/05/19 call completed with patient  . Inter-disciplinary care team collaboration (see longitudinal plan of care) . Evaluation and treatment for auto-immune condition(s) and patient's adherence to plan as established by provider . Provided education to patient re: lab testing that may be recommended by PCP to further evaluate for auto-immune condition . Reviewed and discussed patient's last abnormal auto-immune lab values from labs obtained from 2018-2019 . Collaborated with PCP provider [name], FNP regarding patient's request to be checked for auto-immune condition and referral to Rheumatology if appropriate . Discussed plans with patient for ongoing care management follow up and provided patient with direct contact information for care management team  Patient Self Care Activities:  . Self  administers medications as prescribed . Attends all scheduled provider appointments . Calls pharmacy for medication refills . Performs ADL's independently . Performs IADL's independently . Calls provider office for new concerns or questions  Initial goal documentation     . "I would like to get my Vitamin D level up  to normal limits" (pt-stated)       CARE PLAN ENTRY (see longitudinal plan of care for additional care plan information)  Current Barriers:  [name] Knowledge Deficits related to disease education and support for treatment and maintenance of Vitamin D  . Chronic Disease Management support and education needs related to Chest Pain, unspecified; Obesity; Dyspnea on Exertion; Chronic right shoulder pain; fatigue, unspecified; Vitamin D, unspecified   Nurse Case Manager Clinical Goal(s):  [name] Over the next 90 days, patient will work with the [place] CM and PCP to address needs related to disease education and support to improve Self Health management of Vitamin D deficiency   CCM RN CC Interventions:  09/28/19 call completed with patient  . Inter-disciplinary care team collaboration (see longitudinal plan of care) . Evaluation of current treatment plan related to 90 and patient's adherence to plan as established by provider. . Provided education to patient re: potential causes of Vitamin D deficiency and the effects this can have on your health, treatment and ongoing monitoring of this condition  . Reviewed medications with patient and discussed patient is taking a supplement as prescribed by PCP, reports adherence w/o noted SE  . Discussed plans with patient for ongoing care management follow up and provided patient with direct contact information for care management team . Provided patient with printed  educational materials related to Vitamin D deficiency   Patient Self Care Activities:  . Self administers medications as prescribed . Attends all scheduled provider appointments . Calls pharmacy for medication refills . Performs ADL's independently . Performs IADL's independently . Calls provider office for new concerns or questions  Initial goal documentation     . "to find out what's causing my heart palpitations" (pt-stated)   On track    [place] (see longitudinal plan of care for  additional care plan information)  Current Barriers:  [name] Knowledge Deficits related to evaluation and treatment of Cardiac symptoms including, chest pain, unspecified, Palpitations, Dyspnea on Exertion . Chronic Disease Management support and education needs related to Chest Pain, unspecified; Obesity; Dyspnea on Exertion; Chronic right shoulder pain; fatigue, unspecified; Vitamin D, unspecified   Nurse Case Manager Clinical Goal(s):  [name] Over the next 90 days, patient will work with the [place] CM and PCP  to address needs related to evaluate and treat persistent Cardiac symptoms including Chest pain, unspecified , Palpitations and Dyspnea on Exertion   CCM RN CC Interventions:  09/28/19 call completed with patient  . Inter-disciplinary care team collaboration (see longitudinal plan of care) . Evaluation of current treatment plan related to persistent Cardiac symptoms including Chest Pain, unspecified; Palpitations; and Dyspnea on Exertion and patient's adherence to plan as established by provider . Determined patient has been experiencing persistent episodes of irregular tachycardia that "come and go"; other associated symptoms include, Chest pain, dizziness or lightheadedness, and dyspnea with stair climbing and or with exertion; these symptoms started "several years ago" . Determined patient is established with Dr. Oval Linsey and [name] and that all testing completed has been negative . Determined patient has been tracking some of the tachycardic episodes on her smart watch but has never worn a Holter monitor  that she can recall . Determined [name] ordered a CTA on 08/30/19 that has not been scheduled due to high demand for this type of testing . Determined patient has family history of Cardiac disease to include her maternal grandmother suffered from several heart attacks and her estranged father may have suffered from Atrial fibrillation and passes away about 1 year ago after having  several strokes . Reviewed medications with patient and discussed indication, dosage and frequency of all prescribed medications; patient is not currently taking Breo as prescribed by Pulmonology, she states, "I don't like the way it makes me feel" . Collaborated with Elnita Maxwell from Dr. Ludwig Clarks office regarding has not received a call regarding the scheduling of her Cardia CT; Advised patient continues to have palpitations and is describing symptoms suggestive of Atrial fibrillation; Advised patient has not been ordered to wear an event monitor and is tracking the tachycardia with her Apple Watch but is not finding this to be helpful since she is already aware of the fast irregular heart beat when it occurs . Determined [name] is agreeable to ordering a 30 day event monitor and the office will contact the patient with the details, the monitor will be mailed to her home with instructions for use . Discussed plans with patient for ongoing care management follow up and provided patient with direct contact information for care management team 09/29/19 call completed with patient  . Determined patient received a call from the Cardiologist office advising she will receive the Holter monitor via mail to her home, to further evaluate her palpitations per [name] . Determined patient was advised by the Cardiologist office that her insurance company has yet to provide a prior authorization for her Cardiac CT and this is causing the delay in getting it scheduled . Discussed patient will contact this RN tomorrow am to complete a joint call to [facility] to inquire about the PA needed for the Cardiac CT 10/05/19 call completed with patient . Placed joint call with patient to [facility] to inquire about her prior authorization for the Cardiac CT ordered by [name] . Spoke with CSR who advised the PA has been issued effective 09/27/19-12/28/19 #[id number] . Provided patient with the contact number for  [facility] and instructed her to call this provider's office in order to schedule her Cardia CT . Discussed patient is still waiting to receive her event monitor but was advised by Cardiology that it will be mailed to her home  . Discussed patient's next scheduled OV with PCP provider [name], FNP is set for 10/25/19 @3 :15 PM   Patient Self Care Activities:  . Self administers medications as prescribed . Attends all scheduled provider appointments . Calls pharmacy for medication refills . Performs ADL's independently . Performs IADL's independently . Calls provider office for new concerns or questions  Please see past updates related to this goal by clicking on the "Past Updates" button in the selected goal      . "to keep my BP under good control" (pt-stated)   Not on track    CARE PLAN ENTRY (see longitudinal plan of care for additional care plan information)  Current Barriers:  Knowledge Deficits related to disease process and Self Health management of HTN . Chronic Disease Management support and education needs related to Chest Pain, unspecified; Obesity; Dyspnea on Exertion; Chronic right shoulder pain; fatigue, unspecified; Vitamin D, unspecified   Nurse Case Manager Clinical Goal(s):  [name] Over  the next 90 days, patient will work with the CCM RN and PCP  to address needs related to disease education and support to help improve Self Health management of HTN   CCM RN CC Interventions:  10/05/19 call completed with patient  Inter-disciplinary care team collaboration (see longitudinal plan of care) . Evaluation of current treatment plan related to HTN and patient's adherence to plan as established by provider. . Provided education to patient re: disease process and how to Self management; Educated on target BP of <130/80 . Determined patient experienced several episodes of Hypertensive episodes over the weekend, she experienced these episodes while  experiencing palpitations . Discussed and determined patient will Self monitor her BP twice daily and record her readings to review with PCP at next visit . Provided patient with printed BP Log  . Reviewed medications with patient and discussed indications, dosage and frequency of prescribed medications, patient is not currently taking Breo for Asthma, no other discrepancies identified, pt reports adherence  . Discussed plans with patient for ongoing care management follow up and provided patient with direct contact information for care management team  Patient Self Care Activities:  . Self administers medications as prescribed . Attends all scheduled provider appointments . Calls pharmacy for medication refills . Performs ADL's independently . Performs IADL's independently . Calls provider office for new concerns or questions  Please see past updates related to this goal by clicking on the "Past Updates" button in the selected goal        Patient verbalizes understanding of instructions provided today.   Telephone follow up appointment with care management team member scheduled for: 10/14/19  Delsa Sale, RN, BSN, CCM Care Management Coordinator [facility]  Direct Phone: [phone]

## 2019-10-06 NOTE — Chronic Care Management (AMB) (Signed)
Care Management   Follow Up Note   10/05/2019 Name: [name] MRN: [id number] DOB: February 23, 1976  Referred by: [name], FNP Reason for referral : Care Coordination (FU RN Call )   [name] is a 44 y.o. year old female who is a primary care patient of [name], [place]. The CCM team was consulted for assistance with chronic disease management and care coordination needs.    Review of patient status, including review of consultants reports, relevant laboratory and other test results, and collaboration with appropriate care team members and the patient's provider was performed as part of comprehensive patient evaluation and provision of chronic care management services.    SDOH (Social Determinants of Health) assessments performed: Yes See Care Plan activities for detailed interventions related to [place])    Placed outbound call to patient to assist with Care Coordination needs.   Outpatient Encounter Medications as of 10/05/2019  Medication Sig  . albuterol (VENTOLIN HFA) 108 (90 Base) MCG/ACT inhaler Inhale 2 puffs into the lungs every 6 (six) hours as needed for wheezing or shortness of breath.  . esomeprazole (NEXIUM) 40 MG capsule Take 1 capsule (40 mg total) by mouth daily at 12 noon.  . fluticasone furoate-vilanterol (BREO ELLIPTA) 100-25 MCG/INH AEPB Inhale 1 puff into the lungs daily.  . Fluticasone-Salmeterol (ADVAIR DISKUS) 250-50 MCG/DOSE AEPB Advair Diskus 250 mcg-50 mcg/dose powder for inhalation  . HYDROcodone-acetaminophen (NORCO/VICODIN) 5-325 MG tablet   . pantoprazole (PROTONIX) 40 MG tablet Take 1 tablet (40 mg total) by mouth 2 (two) times daily.  [name] zolpidem (AMBIEN CR) 12.5 MG CR tablet Take 12.5 mg by mouth at bedtime as needed.  . [DISCONTINUED] cetirizine (ZYRTEC) 10 MG tablet Take 1 tablet (10 mg total) by mouth daily.  . [DISCONTINUED] ferrous sulfate 325 (65 FE) MG EC tablet Take 1 tablet (325 mg total) by mouth 2 (two) times daily with a meal.  .  [DISCONTINUED] loratadine (CLARITIN) 10 MG tablet Take 1 tablet (10 mg total) by mouth daily.   No facility-administered encounter medications on file as of 10/05/2019.     Objective:  Lab Results  Component Value Date   HGBA1C 5.7 (H) 06/17/2019   HGBA1C 5.1 12/24/2017   HGBA1C 5.1 06/16/2017   Lab Results  Component Value Date   LDLCALC 108 (H) 06/17/2019   CREATININE 0.94 08/15/2019   BP Readings from Last 3 Encounters:  09/24/19 120/80  08/30/19 122/72  08/25/19 129/79    Goals Addressed      Patient Stated   . "I feel like something gets stuck in my throat when I swallow" (pt-stated)       CARE PLAN ENTRY (see longitudinal plan of care for additional care plan information)  Current Barriers:  [name] Knowledge Deficits related to evaluation and treatment of dysphasia  . Chronic Disease Management support and education needs related to Chest Pain, unspecified; Obesity; Dyspnea on Exertion; Chronic right shoulder pain; fatigue, unspecified; Vitamin D, unspecified, other dysphasia    Nurse Case Manager Clinical Goal(s):  [name] Over the next 90 days, patient will work with [name] and PCP to address needs related to evaluate and treat symptoms of dysphasia  CCM RN CC Interventions:  09/29/19 call completed with patient  . Inter-disciplinary care team collaboration (see longitudinal plan of care) . Evaluation of current treatment plan related to dysphasia and patient's adherence to plan as established by provider . Determined patient is established with GI MD [name] with the most recent  OV completed on 08/19/19; Determined the following prior workup is noted;  o Prior workup: o Capsule endoscopy - 06/23/17 - complete study with good prep, no clear cause for iron deficiency o EGD 02/20/2017 - 2cm HH, otherwise normal exam, biopsies negative for HP o Colonoscopy 02/20/2017 - focal mild erythema in the rectum - biopsied showed no evidence of colitis, otherwise normal colon and  ileum  o MRI liver / abdomen - 02/10/2017 - focal fatty infiltration, normal biliary tree, otherwise no pathology noted in the abdomen o Historic evaluation: o CT scan neck 12/02/16 - normal o CT angio of chest 12/02/2016 - normal o EGD 08/19/2013 - Dr. [name] - normal esophagus, mild gastritis o Esophageal manometry 2015 - normal o PH study 2015 - normal exam, Demeester score of 5.7, positive symptom index for regurgitation and reflux o EGG normal 2015 o Normal hydrogen breath test 2015 o Normal gastric emptying scan 2015 o GES - delayed in 2011 - Reglan caused twitching and it was stopped. o GES 11/13/2017 - normal o Manometry / 24 hr pH impedance testing was recommended o Esophageal manometry 01/12/18 - normal o PH impedance study 01/12/18 - Demeester score 9, SAP positively correlates with reflux, good acid suppression on PPI . Reviewed medications with patient and discussed patient is adhering to taking Pantoprazole exactly as prescribed with good effectiveness for GERD, however, this has not improved the feeling of having food "stuck" in her esphagus . Discussed and determined [name] f/u with an Endocrinologist to further evaluate an abnormal US of her throat and was advised her Thyroid is normal and per the patient, it was recommended to have a CT scan of her throat to further evaluate her swallowing difficulties . Collaborated with PCP provider Arnette FeltsJanece Moore, FNP via in basket message regarding patient's concerns related to swallowing difficulty and current treatment is not relieving symptoms  . Discussed plans with patient for ongoing care management follow up and provided patient with direct contact information for care management team  Patient Self Care Activities:  . Self administers medications as prescribed . Attends all scheduled provider appointments . Calls pharmacy for medication refills . Performs ADL's independently . Performs IADL's independently . Calls provider office for  new concerns or questions  Initial goal documentation     . "I will have an MRI of my right shoulder" (pt-stated)       CARE PLAN ENTRY (see longitudinal plan of care for additional care plan information)  Current Barriers:  [name]. Knowledge Deficits related to evaluation and treatment of chronic right shoulder pain   . Chronic Disease Management support and education needs related to Chest Pain, unspecified; Obesity; Dyspnea on Exertion; Chronic right shoulder pain; fatigue, unspecified; Vitamin D, unspecified, other dysphasia    Nurse Case Manager Clinical Goal(s):  [name]. Over the next 90 days, patient will work with the CCM team and Orthopedic MD to address needs related to evaluate and treat chronic right shoulder pain  CCM RN CC Interventions:  10/05/19 call completed with patient  . Inter-disciplinary care team collaboration (see longitudinal plan of care) . Evaluation of current treatment plan related to chronic right shoulder pain  and patient's adherence to plan as established by provider . Determined patient f/u with Dr. [facility] today to evaluate and treat her right shoulder pain . Reviewed and discussed MD recommendations for further evaluation/treatment of this problem;  o Assessment & Plan: o Visit Diagnoses:  o 1. o Chronic right shoulder pain  o Plan: Impression is chronic right shoulder pain status post right shoulder arthroscopy 2 years ago.  With temporary relief from physical therapy and steroid Dosepak we will need to obtain MRI of the right shoulder to rule out structural abnormalities.  Intraoperative findings at that time did reveal bursal sided partial-thickness tear.  My concern is that this may have progressed.  We will see the patient after the MRI. o Follow-Up Instructions: Return if symptoms worsen or fail to improve . Determined patient has a good understanding of her prescribed treatment plan  . Mailed printed educational material related "Dry  Needling" . Discussed plans with patient for ongoing care management follow up and provided patient with direct contact information for care management team  Patient Self Care Activities:  . Self administers medications as prescribed . Attends all scheduled provider appointments . Calls pharmacy for medication refills . Performs ADL's independently . Performs IADL's independently . Calls provider office for new concerns or questions  Initial goal documentation     . "I would like to be checked for having an autoimmune condition" (pt-stated)       CARE PLAN ENTRY (see longitudinal plan of care for additional care plan information)  Current Barriers:  [name] Knowledge Deficits related to evaluation and treatment of auto-immune disorder . Chronic Disease Management support and education needs related to Chest Pain, unspecified; Obesity; Dyspnea on Exertion; Chronic right shoulder pain; fatigue, unspecified; Vitamin D, unspecified, other dysphasia    Nurse Case Manager Clinical Goal(s):  [name] Over the next 90 days, patient will work with the CCM team and PCP to address needs related to evaluation for any/all auto-immune condition(s) and treatment recommendations as appropriate for any abnormal findings   CCM RN CC Interventions:  10/05/19 call completed with patient  . Inter-disciplinary care team collaboration (see longitudinal plan of care) . Evaluation and treatment for auto-immune condition(s) and patient's adherence to plan as established by provider . Provided education to patient re: lab testing that may be recommended by PCP to further evaluate for auto-immune condition . Reviewed and discussed patient's last abnormal auto-immune lab values from labs obtained from 2018-2019 . Collaborated with PCP provider [name], FNP regarding patient's request to be checked for auto-immune condition and referral to Rheumatology if appropriate . Discussed plans with patient for ongoing care  management follow up and provided patient with direct contact information for care management team  Patient Self Care Activities:  . Self administers medications as prescribed . Attends all scheduled provider appointments . Calls pharmacy for medication refills . Performs ADL's independently . Performs IADL's independently . Calls provider office for new concerns or questions  Initial goal documentation     . "I would like to get my Vitamin D level up to normal limits" (pt-stated)       CARE PLAN ENTRY (see longitudinal plan of care for additional care plan information)  Current Barriers:  [name] Knowledge Deficits related to disease education and support for treatment and maintenance of Vitamin D  . Chronic Disease Management support and education needs related to Chest Pain, unspecified; Obesity; Dyspnea on Exertion; Chronic right shoulder pain; fatigue, unspecified; Vitamin D, unspecified   Nurse Case Manager Clinical Goal(s):  [name] Over the next 90 days, patient will work with the CCM RN CM and PCP to address needs related to disease education and support to improve Self Health management of Vitamin D deficiency   CCM RN CC Interventions:  09/28/19 call completed with patient  . Inter-disciplinary  care team collaboration (see longitudinal plan of care) . Evaluation of current treatment plan related to 90 and patient's adherence to plan as established by provider. . Provided education to patient re: potential causes of Vitamin D deficiency and the effects this can have on your health, treatment and ongoing monitoring of this condition  . Reviewed medications with patient and discussed patient is taking a supplement as prescribed by PCP, reports adherence w/o noted SE  . Discussed plans with patient for ongoing care management follow up and provided patient with direct contact information for care management team . Provided patient with printed  educational materials related to Vitamin D  deficiency   Patient Self Care Activities:  . Self administers medications as prescribed . Attends all scheduled provider appointments . Calls pharmacy for medication refills . Performs ADL's independently . Performs IADL's independently . Calls provider office for new concerns or questions  Initial goal documentation     . "to find out what's causing my heart palpitations" (pt-stated)   On track    CARE PLAN ENTRY (see longitudinal plan of care for additional care plan information)  Current Barriers:  [name] Knowledge Deficits related to evaluation and treatment of Cardiac symptoms including, chest pain, unspecified, Palpitations, Dyspnea on Exertion . Chronic Disease Management support and education needs related to Chest Pain, unspecified; Obesity; Dyspnea on Exertion; Chronic right shoulder pain; fatigue, unspecified; Vitamin D, unspecified   Nurse Case Manager Clinical Goal(s):  [name] Over the next 90 days, patient will work with the CCM RN CM and PCP  to address needs related to evaluate and treat persistent Cardiac symptoms including Chest pain, unspecified , Palpitations and Dyspnea on Exertion   CCM RN CC Interventions:  09/28/19 call completed with patient  . Inter-disciplinary care team collaboration (see longitudinal plan of care) . Evaluation of current treatment plan related to persistent Cardiac symptoms including Chest Pain, unspecified; Palpitations; and Dyspnea on Exertion and patient's adherence to plan as established by provider . Determined patient has been experiencing persistent episodes of irregular tachycardia that "come and go"; other associated symptoms include, Chest pain, dizziness or lightheadedness, and dyspnea with stair climbing and or with exertion; these symptoms started "several years ago" . Determined patient is established with Dr. Duke Salvia and [name] and that all testing completed has been negative . Determined patient has been tracking some of the  tachycardic episodes on her smart watch but has never worn a Holter monitor that she can recall . Determined [name] ordered a CTA on 08/30/19 that has not been scheduled due to high demand for this type of testing . Determined patient has family history of Cardiac disease to include her maternal grandmother suffered from several heart attacks and her estranged father may have suffered from Atrial fibrillation and passes away about 1 year ago after having several strokes . Reviewed medications with patient and discussed indication, dosage and frequency of all prescribed medications; patient is not currently taking Breo as prescribed by Pulmonology, she states, "I don't like the way it makes me feel" . Collaborated with Elnita Maxwell from Dr. Ludwig Clarks office regarding has not received a call regarding the scheduling of her Cardia CT; Advised patient continues to have palpitations and is describing symptoms suggestive of Atrial fibrillation; Advised patient has not been ordered to wear an event monitor and is tracking the tachycardia with her Apple Watch but is not finding this to be helpful since she is already aware of the fast irregular heart beat when it  occurs . Determined [name] is agreeable to ordering a 30 day event monitor and the office will contact the patient with the details, the monitor will be mailed to her home with instructions for use . Discussed plans with patient for ongoing care management follow up and provided patient with direct contact information for care management team 09/29/19 call completed with patient  . Determined patient received a call from the Cardiologist office advising she will receive the Holter monitor via mail to her home, to further evaluate her palpitations per [name] . Determined patient was advised by the Cardiologist office that her insurance company has yet to provide a prior authorization for her Cardiac CT and this is causing the delay in getting it  scheduled . Discussed patient will contact this RN tomorrow am to complete a joint call to [facility] to inquire about the PA needed for the Cardiac CT 10/05/19 call completed with patient . Placed joint call with patient to [facility] to inquire about her prior authorization for the Cardiac CT ordered by [name] . Spoke with CSR who advised the PA has been issued effective 09/27/19-12/28/19 #[id number] . Provided patient with the contact number for  [facility] and instructed her to call this provider's office in order to schedule her Cardia CT . Discussed patient is still waiting to receive her event monitor but was advised by Cardiology that it will be mailed to her home  . Discussed patient's next scheduled OV with PCP provider [name], FNP is set for 10/25/19 :15 PM   Patient Self Care Activities:  . Self administers medications as prescribed . Attends all scheduled provider appointments . Calls pharmacy for medication refills . Performs ADL's independently . Performs IADL's independently . Calls provider office for new concerns or questions  Please see past updates related to this goal by clicking on the "Past Updates" button in the selected goal     . "to keep my BP under good control" (pt-stated)   Not on track    CARE PLAN ENTRY (see longitudinal plan of care for additional care plan information)  Current Barriers:  [name] Knowledge Deficits related to disease process and Self Health management of HTN . Chronic Disease Management support and education needs related to Chest Pain, unspecified; Obesity; Dyspnea on Exertion; Chronic right shoulder pain; fatigue, unspecified; Vitamin D, unspecified   Nurse Case Manager Clinical Goal(s):  [name] Over the next 90 days, patient will work with the CCM RN and PCP  to address needs related to disease education and support to help improve Self Health management of HTN   CCM RN CC Interventions:  10/05/19 call  completed with patient  Inter-disciplinary care team collaboration (see longitudinal plan of care) . Evaluation of current treatment plan related to HTN and patient's adherence to plan as established by provider. . Provided education to patient re: disease process and how to Self management; Educated on target BP of <130/80 . Determined patient experienced several episodes of Hypertensive episodes over the weekend, she experienced these episodes while experiencing palpitations . Discussed and determined patient will Self monitor her BP twice daily and record her readings to review with PCP at next visit . Provided patient with printed BP Log  . Reviewed medications with patient and discussed indications, dosage and frequency of prescribed medications, patient is not currently taking Breo for Asthma, no other discrepancies identified, pt reports adherence  . Discussed plans with patient for ongoing care management follow  up and provided patient with direct contact information for care management team  Patient Self Care Activities:  . Self administers medications as prescribed . Attends all scheduled provider appointments . Calls pharmacy for medication refills . Performs ADL's independently . Performs IADL's independently . Calls provider office for new concerns or questions  Please see past updates related to this goal by clicking on the "Past Updates" button in the selected goal        Plan:   Telephone follow up appointment with care management team member scheduled for:  10/14/19  Delsa Sale, RN, BSN, CCM Care Management Coordinator [facility]  Direct Phone: [phone]

## 2019-10-07 ENCOUNTER — Telehealth: Payer: Self-pay

## 2019-10-07 ENCOUNTER — Telehealth (HOSPITAL_COMMUNITY): Payer: Self-pay | Admitting: *Deleted

## 2019-10-07 ENCOUNTER — Ambulatory Visit: Payer: Self-pay

## 2019-10-07 DIAGNOSIS — R1319 Other dysphagia: Secondary | ICD-10-CM

## 2019-10-07 DIAGNOSIS — R079 Chest pain, unspecified: Secondary | ICD-10-CM

## 2019-10-07 DIAGNOSIS — G8929 Other chronic pain: Secondary | ICD-10-CM

## 2019-10-07 DIAGNOSIS — E559 Vitamin D deficiency, unspecified: Secondary | ICD-10-CM

## 2019-10-07 DIAGNOSIS — R0609 Other forms of dyspnea: Secondary | ICD-10-CM

## 2019-10-07 DIAGNOSIS — R5383 Other fatigue: Secondary | ICD-10-CM

## 2019-10-07 DIAGNOSIS — E669 Obesity, unspecified: Secondary | ICD-10-CM

## 2019-10-07 DIAGNOSIS — R0789 Other chest pain: Secondary | ICD-10-CM

## 2019-10-07 DIAGNOSIS — R0600 Dyspnea, unspecified: Secondary | ICD-10-CM

## 2019-10-07 LAB — SARS CORONAVIRUS 2 (TAT 6-24 HRS): SARS Coronavirus 2: NEGATIVE

## 2019-10-07 NOTE — ED Provider Notes (Signed)
[facility]    CSN: [id number] Arrival date & time: 10/06/19  1943      History   Chief Complaint Chief Complaint  Patient presents with  . Cough    HPI [name] is a 44 y.o. female history of asthma presenting today for evaluation of cough and congestion.  Patient reports that for the past week she has had persistent cough along with persistent sinus congestion and postnasal drainage.  She feels her asthma has been controlled, continuing to use her Breo as well as albuterol as needed.  Does report some occasional wheezing that improves with use of inhaler.  She denies any fevers.  Denies any close sick contacts.  Denies chest pain.  Reports occasional shortness of breath.  Has been using Flonase, Claritin without relief.  HPI  Past Medical History:  Diagnosis Date  . Allergy   . Anemia   . Angio-edema   . Anxiety   . Arthritis   . Constipation   . GERD (gastroesophageal reflux disease)   . Hx of endometriosis   . Insomnia   . Macromastia 07/2011  . Migraine   . Urticaria     Patient Active Problem List   Diagnosis Date Noted  . Generalized anxiety disorder 04/08/2018  . Atypical chest pain   . Regurgitation of food   . Laryngopharyngeal reflux (LPR) 07/30/2017  . Throat soreness 07/30/2017  . ANA positive 12/30/2016  . Gastroesophageal reflux disease 06/27/2016  . Disturbance of skin sensation 04/27/2014  . Dizziness and giddiness 03/14/2014  . Headache, migraine 03/14/2014  . Myalgia and myositis, unspecified 06/07/2013  . Nausea and vomiting in adult 08/22/2012  . Viral syndrome 08/22/2012  . Pelvic pain in female 12/18/2010  . PID (acute pelvic inflammatory disease) 12/18/2010  . ANEMIA-IRON DEFICIENCY 10/15/2006  . HERPES, GENITAL NOS 09/30/2006  . OBESITY NOS 09/30/2006  . DISORDER, NONORGANIC SLEEP NOS 09/30/2006  . CONSTIPATION NOS 09/30/2006  . NEURITIS, LUMBOSACRAL NOS 09/30/2006  . HX, PERSONAL, MENTAL DISORDER NOS 09/30/2006     Past Surgical History:  Procedure Laterality Date  . 24 HOUR PH STUDY N/A 01/12/2018   Procedure: 24 HOUR PH STUDY;  Surgeon: Napoleon Form, MD;  Location: [facility];  Service: Endoscopy;  Laterality: N/A;  . ABDOMINAL HYSTERECTOMY  12/12/2003  . ADENOIDECTOMY  05/2011  . ANKLE ARTHROSCOPY  12/29/2007   right; with extensive debridement  . BLADDER NECK RECONSTRUCTION  01/14/2011   Procedure: BLADDER NECK REPAIR;  Surgeon: Reva Bores, MD;  Location: [facility];  Service: Gynecology;  Laterality: N/A;  Laparoscopic Repair of Incidental Cystotomy  . BREAST REDUCTION SURGERY  08/05/2011   Procedure: MAMMARY REDUCTION  (BREAST);  Surgeon: Louisa Second, MD;  Location: [facility];  Service: Plastics;  Laterality: Bilateral;  bilateral  . CESAREAN SECTION  12/29/2000; 1994  . DILATION AND CURETTAGE OF UTERUS  07/08/2003   open laparoscopy  . ESOPHAGEAL MANOMETRY N/A 01/12/2018   Procedure: ESOPHAGEAL MANOMETRY (EM);  Surgeon: Napoleon Form, MD;  Location: [facility];  Service: Endoscopy;  Laterality: N/A;  . GIVENS CAPSULE STUDY N/A 06/23/2017   Procedure: GIVENS CAPSULE STUDY;  Surgeon: Benancio Deeds, MD;  Location: [facility];  Service: Gastroenterology;  Laterality: N/A;  . LAPAROSCOPIC SALPINGOOPHERECTOMY  01/14/2011   left  . PH IMPEDANCE STUDY N/A 01/12/2018   Procedure: PH IMPEDANCE STUDY;  Surgeon: Napoleon Form, MD;  Location: [facility];  Service: Endoscopy;  Laterality: N/A;  . REPAIR PERONEAL TENDONS  ANKLE  01/03/2010   repair right subluxing peroneal tendons  . RESECTION DISTAL CLAVICAL Right 09/25/2017   Procedure: RESECTION DISTAL CLAVICAL;  Surgeon: Bjorn Pippin, MD;  Location: [facility];  Service: Orthopedics;  Laterality: Right;  . RIGHT OOPHORECTOMY     with lysis of adhesions  . SHOULDER ACROMIOPLASTY Right 09/25/2017   Procedure: SHOULDER ACROMIOPLASTY;  Surgeon: Bjorn Pippin, MD;  Location: [facility];   Service: Orthopedics;  Laterality: Right;  . SHOULDER ARTHROSCOPY WITH DEBRIDEMENT AND BICEP TENDON REPAIR Right 09/25/2017   Procedure: SHOULDER ARTHROSCOPY WITH DEBRIDEMENT AND BICEP TENDON REPAIR;  Surgeon: Bjorn Pippin, MD;  Location: [facility];  Service: Orthopedics;  Laterality: Right;  . SHOULDER ARTHROSCOPY WITH ROTATOR CUFF REPAIR Right 09/25/2017   Procedure: SHOULDER ARTHROSCOPY WITH ROTATOR CUFF REPAIR;  Surgeon: Bjorn Pippin, MD;  Location: [facility];  Service: Orthopedics;  Laterality: Right;  . TONSILLECTOMY  as a child  . TUBAL LIGATION  12/29/2000    OB History    Gravida  2   Para  2   Term  2   Preterm      AB      Living  2     SAB      TAB      Ectopic      Multiple      Live Births               Home Medications    Prior to Admission medications   Medication Sig Start Date End Date Taking? Authorizing Provider  fluticasone furoate-vilanterol (BREO ELLIPTA) 100-25 MCG/INH AEPB Inhale 1 puff into the lungs daily. 09/24/19  Yes Charlott Holler, MD  pantoprazole (PROTONIX) 40 MG tablet Take 1 tablet (40 mg total) by mouth 2 (two) times daily. 08/25/19  Yes Lamptey, Britta Mccreedy, MD  albuterol (VENTOLIN HFA) 108 (90 Base) MCG/ACT inhaler Inhale 2 puffs into the lungs every 6 (six) hours as needed for wheezing or shortness of breath.    [provider]  benzonatate (TESSALON) 200 MG capsule Take 1 capsule (200 mg total) by mouth 3 (three) times daily as needed for up to 7 days for cough. 10/06/19 10/13/19  Oluwatimileyin Vivier C, PA-C  doxycycline (VIBRAMYCIN) 100 MG capsule Take 1 capsule (100 mg total) by mouth 2 (two) times daily for 7 days. 10/06/19 10/13/19  Melvena Vink C, PA-C  esomeprazole (NEXIUM) 40 MG capsule Take 1 capsule (40 mg total) by mouth daily at 12 noon. 10/05/19   Armbruster, Willaim Rayas, MD  predniSONE (DELTASONE) 20 MG tablet Take 2 tablets (40 mg total) by mouth daily with breakfast for 5 days. 10/06/19 10/11/19   Shin Lamour C, PA-C  zolpidem (AMBIEN CR) 12.5 MG CR tablet Take 12.5 mg by mouth at bedtime as needed. 09/13/19   [provider]  cetirizine (ZYRTEC) 10 MG tablet Take 1 tablet (10 mg total) by mouth daily. 01/09/18 08/25/19  Marcelyn Bruins, MD  ferrous sulfate 325 (65 FE) MG EC tablet Take 1 tablet (325 mg total) by mouth 2 (two) times daily with a meal. 02/05/17 08/25/19  Armbruster, Willaim Rayas, MD  Fluticasone-Salmeterol (ADVAIR DISKUS) 250-50 MCG/DOSE AEPB Advair Diskus 250 mcg-50 mcg/dose powder for inhalation  10/06/19  [provider]  loratadine (CLARITIN) 10 MG tablet Take 1 tablet (10 mg total) by mouth daily. 12/24/17 08/25/19  Kallie Locks, FNP    Family History Family History  Problem Relation  Age of Onset  . Diabetes Mother   . Hypertension Mother   . Thyroid disease Sister   . Heart attack Maternal Grandmother   . Stroke Maternal Grandfather   . Asthma Son   . Asthma Daughter   . Colon cancer Neg Hx   . Colon polyps Neg Hx   . Esophageal cancer Neg Hx   . Rectal cancer Neg Hx   . Stomach cancer Neg Hx     Social History Social History   Tobacco Use  . Smoking status: Never Smoker  . Smokeless tobacco: Never Used  Substance Use Topics  . Alcohol use: Yes    Comment: occasional   . Drug use: No     Allergies   Patient has no known allergies.   Review of Systems Review of Systems  Constitutional: Positive for fatigue. Negative for activity change, appetite change, chills and fever.  HENT: Positive for congestion, postnasal drip and sinus pressure. Negative for ear pain, rhinorrhea, sore throat and trouble swallowing.   Eyes: Negative for discharge and redness.  Respiratory: Positive for cough. Negative for chest tightness and shortness of breath.   Cardiovascular: Negative for chest pain.  Gastrointestinal: Negative for abdominal pain, diarrhea, nausea and vomiting.  Musculoskeletal: Negative for myalgias.  Skin: Negative for  rash.  Neurological: Positive for headaches. Negative for dizziness and light-headedness.     Physical Exam Triage Vital Signs ED Triage Vitals  Enc Vitals Group     BP 10/06/19 2012 139/82     Pulse Rate 10/06/19 2012 90     Resp --      Temp 10/06/19 2012 98.7 F (37.1 C)     Temp Source 10/06/19 2012 Oral     SpO2 10/06/19 2012 100 %     Weight --      Height --      Head Circumference --      Peak Flow --      Pain Score 10/06/19 2019 6     Pain Loc --      Pain Edu? --      Excl. in GC? --    No data found.  Updated Vital Signs BP 139/82 (BP Location: Left Arm)   Pulse 90   Temp 98.7 F (37.1 C) (Oral)   LMP 12/27/2003   SpO2 100%   Visual Acuity Right Eye Distance:   Left Eye Distance:   Bilateral Distance:    Right Eye Near:   Left Eye Near:    Bilateral Near:     Physical Exam Vitals and nursing note reviewed.  Constitutional:      Appearance: She is well-developed.     Comments: No acute distress  HENT:     Head: Normocephalic and atraumatic.     Ears:     Comments: Bilateral ears without tenderness to palpation of external auricle, tragus and mastoid, EAC's without erythema or swelling, TM's with good bony landmarks and cone of light. Non erythematous.     Nose: Nose normal.     Mouth/Throat:     Comments: Oral mucosa pink and moist, no tonsillar enlargement or exudate. Posterior pharynx patent and nonerythematous, no uvula deviation or swelling. Normal phonation.  Eyes:     Conjunctiva/sclera: Conjunctivae normal.  Cardiovascular:     Rate and Rhythm: Normal rate.  Pulmonary:     Effort: Pulmonary effort is normal. No respiratory distress.     Comments: Breathing comfortably at rest, CTABL, no wheezing, rales or other adventitious  sounds auscultated  Abdominal:     General: There is no distension.  Musculoskeletal:        General: Normal range of motion.     Cervical back: Neck supple.  Skin:    General: Skin is warm and dry.    Neurological:     Mental Status: She is alert and oriented to person, place, and time.      UC Treatments / Results  Labs (all labs ordered are listed, but only abnormal results are displayed) Labs Reviewed  SARS CORONAVIRUS 2 (TAT 6-24 HRS)    EKG   Radiology No results found.  Procedures Procedures (including critical care time)  Medications Ordered in UC Medications - No data to display  Initial Impression / Assessment and Plan / UC Course  I have reviewed the triage vital signs and the nursing notes.  Pertinent labs & imaging results that were available during my care of the patient were reviewed by me and considered in my medical decision making (see chart for details).     Covid PCR pending.  Exam unremarkable, vital signs stable, lungs clear auscultation today, treating for sinusitis with doxycycline, Tessalon for cough, course of prednisone to help with inflammation in sinuses and chest.  Rest and fluids.  Discussed strict return precautions. Patient verbalized understanding and is agreeable with plan.  Final Clinical Impressions(s) / UC Diagnoses   Final diagnoses:  Acute sinusitis with symptoms > 10 days     Discharge Instructions     COVID test pending- monitor MyChart for results Continue claritin, flonase- may try mucinex or nasacort as alternatives Tessalon/benzonatate as needed for cough Begin course of prednisone- 40 mg daily x 5 days- take with food in morning Doxycycline twice daily x 1 week  Follow up if not improving or worsening   ED Prescriptions    Medication Sig Dispense Auth. Provider   benzonatate (TESSALON) 200 MG capsule Take 1 capsule (200 mg total) by mouth 3 (three) times daily as needed for up to 7 days for cough. 28 capsule Mazikeen Hehn C, PA-C   doxycycline (VIBRAMYCIN) 100 MG capsule Take 1 capsule (100 mg total) by mouth 2 (two) times daily for 7 days. 14 capsule Adriahna Shearman C, PA-C   predniSONE (DELTASONE) 20 MG  tablet Take 2 tablets (40 mg total) by mouth daily with breakfast for 5 days. 10 tablet Lorenso Quirino, [place] C, PA-C     PDMP not reviewed this encounter.   Lennyn Gange, [place] C, PA-C 10/07/19 1030

## 2019-10-07 NOTE — Telephone Encounter (Signed)

## 2019-10-07 NOTE — Chronic Care Management (AMB) (Signed)
  Chronic Care Management   Outreach Note  10/07/2019 Name: [name] MRN: [id number] DOB: [birth year]  Referred by: [name], FNP Reason for referral : Care Coordination (FU RN CM Call )   An unsuccessful telephone outreach was attempted today. The patient was referred to the case management team for assistance with care management and care coordination.   Follow Up Plan: Telephone follow up appointment with care management team member scheduled for: 10/14/19  Delsa Sale, RN, BSN, CCM Care Management Coordinator [facility]  Direct Phone: [phone]

## 2019-10-07 NOTE — Chronic Care Management (AMB) (Signed)
  Care Management   Follow Up Note   10/07/2019 Name: [name] MRN: [id number] DOB: 05-Sep-1975  Referred by: Sheila Brine, FNP Reason for referral : Care Coordination   [name] is a 44 y.o. year old female who is a primary care patient of [name], [place]. The care management team was consulted for assistance with care management and care coordination needs.    Review of patient status, including review of consultants reports, relevant laboratory and other test results, and collaboration with appropriate care team members and the patient's provider was performed as part of comprehensive patient evaluation and provision of chronic care management services.    SDOH (Social Determinants of Health) assessments performed: No See Care Plan activities for detailed interventions related to [name])     Advanced Directives: See Care Plan and Vynca application for related entries.   Goals Addressed            This Visit's Progress   . Collaborate with RN Care Manager to perform approrpiate assessments to determine care management and care coordination needs       CARE PLAN ENTRY (see longitudinal plan of care for additional care plan information)  Current Barriers:  . Four ED visits over the last 30 days . Patient experiencing heart palpitations without known cause . Limited knowledge of how to react to symptoms of heart palpitations due to lack of clinical diagnosis   Social Work Clinical Goal(s):  [name] Over the next 20 days the patient will receive imaging studies ordered by [name] on 08/30/19 . Over the next 60 days the patient will work with embedded care management team to establish an individualized plan of care related to the management of patients ongoing medical concerns Goal Met  SW Interventions: Completed 10/07/19 . Collaborated in multidisciplinary case discussion to evaluate patient ongoing care management needs . Performed chart review to note Cardiac CT  scheduled for Friday 6/4 . Unsuccessful outbound call placed to the patient to review status of holter monitor o Voice message left requesting return call  Patient Self Care Activities:  . Patient verbalizes understanding of plan to work with embedded care management team to address ongoing care coordination needs . Self administers medications as prescribed . Performs ADL's independently . Performs IADL's independently . Calls provider office for new concerns or questions  Please see past updates related to this goal by clicking on the "Past Updates" button in the selected goal          The care management team will reach out to the patient again over the next 10 days.   [name], BSW, CDP Social Worker, Certified Dementia Practitioner [place] [facility] [phone]

## 2019-10-08 ENCOUNTER — Ambulatory Visit (HOSPITAL_COMMUNITY)
Admission: RE | Admit: 2019-10-08 | Discharge: 2019-10-08 | Disposition: A | Discharge status: Home / Self Care | Payer: 59 | Source: Ambulatory Visit | Attending: Cardiology | Admitting: Cardiology

## 2019-10-08 ENCOUNTER — Other Ambulatory Visit: Payer: Self-pay

## 2019-10-08 ENCOUNTER — Ambulatory Visit (INDEPENDENT_AMBULATORY_CARE_PROVIDER_SITE_OTHER): Payer: 59

## 2019-10-08 DIAGNOSIS — R002 Palpitations: Secondary | ICD-10-CM

## 2019-10-08 DIAGNOSIS — R0789 Other chest pain: Secondary | ICD-10-CM | POA: Diagnosis not present

## 2019-10-08 MED ORDER — IOHEXOL 350 MG/ML SOLN
80.0000 mL | Freq: Once | INTRAVENOUS | Status: AC | PRN
  Administered 2019-10-08: 80 mL via INTRAVENOUS

## 2019-10-08 MED ORDER — NITROGLYCERIN 0.4 MG SL SUBL
SUBLINGUAL_TABLET | SUBLINGUAL | Status: AC
  Administered 2019-10-08: 0.8 mg via SUBLINGUAL
  Filled 2019-10-08: qty 2

## 2019-10-08 MED ORDER — NITROGLYCERIN 0.4 MG SL SUBL: 0.8000 mg | SUBLINGUAL_TABLET | Freq: Once | SUBLINGUAL | Status: AC

## 2019-10-08 NOTE — Progress Notes (Signed)
CT scan completed. Tolerated well. D/C home ambulatory, awake and alert. In no distress. 

## 2019-10-12 ENCOUNTER — Encounter: Payer: Self-pay | Admitting: Nurse Practitioner

## 2019-10-12 ENCOUNTER — Other Ambulatory Visit: Payer: Self-pay

## 2019-10-12 ENCOUNTER — Ambulatory Visit (INDEPENDENT_AMBULATORY_CARE_PROVIDER_SITE_OTHER): Payer: 59 | Admitting: Nurse Practitioner

## 2019-10-12 VITALS — BP 132/80 | HR 77 | Temp 98.0°F | Ht 65.0 in | Wt 246.4 lb

## 2019-10-12 DIAGNOSIS — N898 Other specified noninflammatory disorders of vagina: Secondary | ICD-10-CM

## 2019-10-12 DIAGNOSIS — R519 Headache, unspecified: Secondary | ICD-10-CM | POA: Diagnosis not present

## 2019-10-12 DIAGNOSIS — R768 Other specified abnormal immunological findings in serum: Secondary | ICD-10-CM | POA: Diagnosis not present

## 2019-10-12 DIAGNOSIS — J029 Acute pharyngitis, unspecified: Secondary | ICD-10-CM | POA: Diagnosis not present

## 2019-10-12 NOTE — Progress Notes (Signed)
This visit occurred during the SARS-CoV-2 public health emergency.  Safety protocols were in place, including screening questions prior to the visit, additional usage of staff PPE, and extensive cleaning of exam room while observing appropriate contact time as indicated for disinfecting solutions.  Subjective:     Patient ID: [name] , female    DOB: 01/06/1976 , 43 y.o.   MRN: [id number]   Chief Complaint  Patient presents with  . Headache    patient is here for a 3 month f/u     HPI  She is complaining of cough - treated with tessalon perles, prednisone and doxycycline.  She continues to have difficulty swallowing with a feeling like goes to the left.  She is currently wearing an event monitor. CT heart done which was negative. She went to [facility] on her own to have an ANA done.  She is having dry mouth as well.   Headache  This is a chronic problem. The current episode started more than 1 year ago. The problem occurs constantly. The problem has been resolved. The pain quality is not similar to prior headaches. The quality of the pain is described as aching. Pertinent negatives include no abdominal pain, coughing or dizziness. She has tried nothing for the symptoms. Her past medical history is significant for obesity.     Past Medical History:  Diagnosis Date  . Allergy   . Anemia   . Angio-edema   . Anxiety   . Arthritis   . Constipation   . GERD (gastroesophageal reflux disease)   . Hx of endometriosis   . Insomnia   . Macromastia 07/2011  . Migraine   . Urticaria      Family History  Problem Relation Age of Onset  . Diabetes Mother   . Hypertension Mother   . Thyroid disease Sister   . Heart attack Maternal Grandmother   . Stroke Maternal Grandfather   . Asthma Son   . Asthma Daughter   . Colon cancer Neg Hx   . Colon polyps Neg Hx   . Esophageal cancer Neg Hx   . Rectal cancer Neg Hx   . Stomach cancer Neg Hx      Current Outpatient Medications:   .  albuterol (VENTOLIN HFA) 108 (90 Base) MCG/ACT inhaler, Inhale 2 puffs into the lungs every 6 (six) hours as needed for wheezing or shortness of breath., Disp: , Rfl:  .  esomeprazole (NEXIUM) 40 MG capsule, Take 1 capsule (40 mg total) by mouth daily at 12 noon., Disp: 30 capsule, Rfl: 6 .  fluticasone furoate-vilanterol (BREO ELLIPTA) 100-25 MCG/INH AEPB, Inhale 1 puff into the lungs daily., Disp: 1 each, Rfl: 11 .  zolpidem (AMBIEN CR) 12.5 MG CR tablet, Take 12.5 mg by mouth at bedtime as needed., Disp: , Rfl:    No Known Allergies   Review of Systems  Constitutional: Negative.  Negative for fatigue.  Respiratory: Negative.  Negative for cough.   Cardiovascular: Negative.  Negative for chest pain, palpitations and leg swelling.  Gastrointestinal: Negative for abdominal pain.  Neurological: Positive for headaches. Negative for dizziness.  Psychiatric/Behavioral: Negative.      Today's Vitals   10/12/19 1512  BP: 132/80  Pulse: 77  Temp: 98 F (36.7 C)  TempSrc: Oral  Weight: 246 lb 6.4 oz (111.8 kg)  Height: 5\' 5"  (1.651 m)  PainSc: 0-No pain   Body mass index is 41 kg/m.   Objective:  Physical Exam Vitals reviewed.  Constitutional:      General: She is not in acute distress.    Appearance: She is well-developed.  Cardiovascular:     Heart sounds: Normal heart sounds. No murmur.  Pulmonary:     Effort: Pulmonary effort is normal. No respiratory distress.     Breath sounds: Normal breath sounds.  Neurological:     Mental Status: She is alert.  Psychiatric:        Mood and Affect: Mood normal. Mood is not anxious.        Speech: Speech normal.        Behavior: Behavior normal.         Assessment And Plan:     1. Positive ANA (antinuclear antibody)  Has a positive ANA from outside [employer]  This is the second time she has had a positive ANA  She complains of fatigue, joint pain and dry mouth/sorethroat - Ambulatory referral to Rheumatology  2. Acute  nonintractable headache, unspecified headache type  Resolved after taking prednisone  3. Throat soreness  Continues to have sore throat and describes dry mouth  I advised her to use biotin to moisten her mouth and will make a referral to the ENT for further evaluation - Ambulatory referral to ENT  4. Vaginal dryness  Given sample of hylagen gel  [name], FNP    THE PATIENT IS ENCOURAGED TO PRACTICE SOCIAL DISTANCING DUE TO THE COVID-19 PANDEMIC.

## 2019-10-12 NOTE — Patient Instructions (Signed)
Use biotin oral gel for the mouth dryness

## 2019-10-14 ENCOUNTER — Encounter: Payer: Self-pay | Admitting: Nurse Practitioner

## 2019-10-14 ENCOUNTER — Other Ambulatory Visit: Payer: Self-pay

## 2019-10-14 ENCOUNTER — Ambulatory Visit: Payer: Self-pay

## 2019-10-14 ENCOUNTER — Telehealth: Payer: Self-pay

## 2019-10-14 DIAGNOSIS — R079 Chest pain, unspecified: Secondary | ICD-10-CM

## 2019-10-14 DIAGNOSIS — G8929 Other chronic pain: Secondary | ICD-10-CM

## 2019-10-14 DIAGNOSIS — R5383 Other fatigue: Secondary | ICD-10-CM

## 2019-10-14 DIAGNOSIS — R7689 Other specified abnormal immunological findings in serum: Secondary | ICD-10-CM

## 2019-10-14 DIAGNOSIS — R768 Other specified abnormal immunological findings in serum: Secondary | ICD-10-CM

## 2019-10-14 DIAGNOSIS — R0609 Other forms of dyspnea: Secondary | ICD-10-CM

## 2019-10-14 DIAGNOSIS — E559 Vitamin D deficiency, unspecified: Secondary | ICD-10-CM

## 2019-10-14 DIAGNOSIS — R0600 Dyspnea, unspecified: Secondary | ICD-10-CM

## 2019-10-14 DIAGNOSIS — E669 Obesity, unspecified: Secondary | ICD-10-CM

## 2019-10-14 NOTE — Progress Notes (Signed)
NEUROLOGY FOLLOW UP OFFICE NOTE  [name] [id number]  HISTORY OF PRESENT ILLNESS: [name] is a 44 year old right-handed female who follows up for dysphagia.      In 2019, she started having trouble swallowing.  At first, she had trouble only with solid food but has since included liquids.  She denies food getting stuck but it seems to take a while for it to go down.  She also reports shortness of breath both exertional or at rest.  She also notes associated wheezing and chest discomfort.  She was recently evaluated in the ED last month for chest pain.  Cardiac and pulmonary workup was negative.  She reports that she has been experiencing anxiety.  She has been evaluated by gastroenterology for regurgitation.  Gastric emptying study from July was normal.  At that time, she reported to GI that she did not have dysphagia.  Esophogeal manometry from September 2019 was normal. Myasthenia panel, including Anti-striation antibody, AChR binding antibodies, ACh Blocking antibodies, ACh modulating antibodies.  Lupus anticoagulant panel was negative.  B12 was normal.  Thyroid panel was negative.  She also reports intermittent horizontal double vision while driving or looking at something for prolonged period.  She saw her ophthalmologist, Dr. Dione Booze, who noted possible right ptosis.  She reports neck pain as well.  She also reports weakness and heaviness in the legs and reports 2 falls over the past year.  She has also reported having food allergies.  She underwent food allergy panel which was normal. She had a change in insurance and couldn't complete workup.  However, she reports worsening dysphagia over the past 3 months, trouble with swallowing solid foods.  Modified barium swallow from 09/02/2019 showed mild intermittent esophageal dysmotility.  Also feels generalized weakness and myalgias.  No double vision.  ANA from June 3 was positive.  She has history of multiple neurologic and  somatic symptoms.  She has previously endorsed diffuse pain and allodynia with sensations of electric shocks throughout her body.  She has history of migraines.  Of note, she had an MRI of the head without contrast on 01/16/17 to evaluate right arm numbness, which was personally reviewed and was unremarkable.  She was subsequently diagnosed with a complicated migraine.  She has history of fatigue.  Past sleep study was negative.  She has previously been evaluated by rheumatology who has diagnosed her with fibromyalgia.  She has history of reflux and regurgitation.  She reportedly had a gastric emptying study in 2011.  Subsequent gastric imaging study from 2015 was normal. She has been evaluated by multiple ENTs.  She has anxiety.  She denies family history of autoimmune disease on her mother's side (she is not familiar with her father's side of the family).     PAST MEDICAL HISTORY: Past Medical History:  Diagnosis Date  . Allergy   . Anemia   . Angio-edema   . Anxiety   . Arthritis   . Constipation   . GERD (gastroesophageal reflux disease)   . Hx of endometriosis   . Insomnia   . Macromastia 07/2011  . Migraine   . Urticaria     MEDICATIONS: Current Outpatient Medications on File Prior to Visit  Medication Sig Dispense Refill  . albuterol (VENTOLIN HFA) 108 (90 Base) MCG/ACT inhaler Inhale 2 puffs into the lungs every 6 (six) hours as needed for wheezing or shortness of breath.    . esomeprazole (NEXIUM) 40 MG capsule Take 1 capsule (40  mg total) by mouth daily at 12 noon. 30 capsule 6  . fluticasone furoate-vilanterol (BREO ELLIPTA) 100-25 MCG/INH AEPB Inhale 1 puff into the lungs daily. 1 each 11  . zolpidem (AMBIEN CR) 12.5 MG CR tablet Take 12.5 mg by mouth at bedtime as needed.    . [DISCONTINUED] cetirizine (ZYRTEC) 10 MG tablet Take 1 tablet (10 mg total) by mouth daily. 30 tablet 5  . [DISCONTINUED] ferrous sulfate 325 (65 FE) MG EC tablet Take 1 tablet (325 mg total) by  mouth 2 (two) times daily with a meal. 60 tablet 3  . [DISCONTINUED] Fluticasone-Salmeterol (ADVAIR DISKUS) 250-50 MCG/DOSE AEPB Advair Diskus 250 mcg-50 mcg/dose powder for inhalation    . [DISCONTINUED] loratadine (CLARITIN) 10 MG tablet Take 1 tablet (10 mg total) by mouth daily. 30 tablet 11   No current facility-administered medications on file prior to visit.    ALLERGIES: No Known Allergies  FAMILY HISTORY: Family History  Problem Relation Age of Onset  . Diabetes Mother   . Hypertension Mother   . Thyroid disease Sister   . Heart attack Maternal Grandmother   . Stroke Maternal Grandfather   . Asthma Son   . Asthma Daughter   . Colon cancer Neg Hx   . Colon polyps Neg Hx   . Esophageal cancer Neg Hx   . Rectal cancer Neg Hx   . Stomach cancer Neg Hx     SOCIAL HISTORY: Social History   Socioeconomic History  . Marital status: Divorced    Spouse name: Not on file  . Number of children: 2  . Years of education: 12th  . Highest education level: Not on file  Occupational History    Employer: OTHER    Comment: [employer]  Tobacco Use  . Smoking status: Never Smoker  . Smokeless tobacco: Never Used  Vaping Use  . Vaping Use: Never used  Substance and Sexual Activity  . Alcohol use: Yes    Comment: occasional   . Drug use: No  . Sexual activity: Not on file  Other Topics Concern  . 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 Difficulty of Paying Living Expenses:   Food Insecurity: No Food Insecurity  . Worried About Charity fundraiser in the Last Year: Never true  . Ran Out of Food in the Last Year: Never true  Transportation Needs: No Transportation Needs  . Lack of Transportation (Medical): No  . Lack of Transportation (Non-Medical): No  Physical Activity:     . Days of Exercise per Week:   . Minutes of Exercise per Session:   Stress:   . Feeling of Stress :   Social Connections:   . Frequency of Communication with Friends and Family:   . Frequency of Social Gatherings with Friends and Family:   . Attends Religious Services:   . Active Member of Clubs or Organizations:   . Attends Archivist Meetings:   [name] Marital Status:   Intimate Partner Violence:   . Fear of Current or Ex-Partner:   . Emotionally Abused:   [name] Physically Abused:   . Sexually Abused:     PHYSICAL EXAM: Blood pressure (!) 146/89, pulse 80, resp. rate 18, height 5\' 4"  (  1.626 m), weight 247 lb (112 kg), last menstrual period 12/27/2003, SpO2 100 %. General: No acute distress.  Patient appears well-groomed.   Head:  Normocephalic/atraumatic Eyes:  Fundi examined but not visualized Neck: supple, no paraspinal tenderness, full range of motion Heart:  Regular rate and rhythm Lungs:  Clear to auscultation bilaterally Back: No paraspinal tenderness Neurological Exam: alert and oriented to person, place, and time. Attention span and concentration intact, recent and remote memory intact, fund of knowledge intact.  Speech fluent and not dysarthric, language intact.  CN II-XII intact. Bulk and tone normal, muscle strength 5/5 throughout.  Sensation to pinprick and vibration intact.  Deep tendon reflexes 2+ throughout, toes downgoing.  Finger to nose and heel to shin testing intact.  Gait normal, Romberg negative.  IMPRESSION: 1.  Dysphagia.  Barium swallow reveals mild intermittent esophageal dysphagia, not oropharyngeal dysphagia which would be expected with a muscle or neuromuscular junction disease.  However, given her other symptoms of weakness and myalgia, we will pursue further testing, particularly for myopathy. 2.  Muscle weakness 3.  Myalgias    PLAN: 1.  Recheck myasthenia panel but will also check MUSK antibody.  Also check CK and aldolase. 2.  NCV-EMG 4.   Follow up after testing.  Shon Millet, DO  CC: [name], FNP

## 2019-10-15 ENCOUNTER — Ambulatory Visit (INDEPENDENT_AMBULATORY_CARE_PROVIDER_SITE_OTHER): Payer: 59 | Admitting: Neurology

## 2019-10-15 ENCOUNTER — Encounter: Payer: Self-pay | Admitting: Neurology

## 2019-10-15 ENCOUNTER — Other Ambulatory Visit: Payer: Self-pay

## 2019-10-15 ENCOUNTER — Ambulatory Visit: Payer: Self-pay

## 2019-10-15 ENCOUNTER — Telehealth: Payer: Self-pay

## 2019-10-15 VITALS — BP 146/89 | HR 80 | Resp 18 | Ht 64.0 in | Wt 247.0 lb

## 2019-10-15 DIAGNOSIS — R1310 Dysphagia, unspecified: Secondary | ICD-10-CM

## 2019-10-15 DIAGNOSIS — R531 Weakness: Secondary | ICD-10-CM | POA: Diagnosis not present

## 2019-10-15 DIAGNOSIS — R0789 Other chest pain: Secondary | ICD-10-CM

## 2019-10-15 DIAGNOSIS — M791 Myalgia, unspecified site: Secondary | ICD-10-CM | POA: Diagnosis not present

## 2019-10-15 NOTE — Chronic Care Management (AMB) (Addendum)
  Care Management   Outreach Note  10/15/2019 Name: [name] MRN: [id number] DOB: [birth year]  Referred by: [name], FNP Reason for referral : Care Coordination ([facility] RN CC Call )   A second unsuccessful telephone outreach was attempted today. The patient was referred to the case management team for assistance with care management and care coordination.   Follow Up Plan: A HIPPA compliant phone message was left for the patient providing contact information and requesting a return call.  Telephone follow up appointment with care management team member scheduled for: 11/16/19  Delsa Sale, RN, BSN, CCM Care Management Coordinator [facility] Care Management/[facility]  Direct Phone: [phone]

## 2019-10-15 NOTE — Chronic Care Management (AMB) (Signed)
Care Management   Follow Up Note   10/15/2019 Name: [name] MRN: [id number] DOB: 1975-09-12  Referred by: [name], FNP Reason for referral : Care Coordination   [name] is a 44 y.o. year old female who is a primary care patient of [name], FNP. The care management team was consulted for assistance with care management and care coordination needs.    Review of patient status, including review of consultants reports, relevant laboratory and other test results, and collaboration with appropriate care team members and the patient's provider was performed as part of comprehensive patient evaluation and provision of chronic care management services.    SDOH (Social Determinants of Health) assessments performed: No See Care Plan activities for detailed interventions related to [facility])     Advanced Directives: See Care Plan and Vynca application for related entries.   Goals Addressed            This Visit's Progress   . COMPLETED: Collaborate with RN Care Manager to perform approrpiate assessments to determine care management and care coordination needs       CARE PLAN ENTRY (see longitudinal plan of care for additional care plan information)  Current Barriers:  . Four ED visits over the last 30 days . Patient experiencing heart palpitations without known cause . Limited knowledge of how to react to symptoms of heart palpitations due to lack of clinical diagnosis   Social Work Clinical Goal(s):  [name] Over the next 20 days the patient will receive imaging studies ordered by [name] on 08/30/19 Goal Met on 6/4 . Over the next 60 days the patient will work with embedded care management team to establish an individualized plan of care related to the management of patients ongoing medical concerns Goal Met  SW Interventions: Completed 10/15/19 . Successful outbound call placed to the patient who reports she is unavailable to complete today's call and would  call SW back at a later time . Chart review performed to note patient had CT performed 6/4 . Goal Met- SW to sign off and will be available to assist with future care coordination needs should they arise   Completed 10/07/19 . Collaborated in multidisciplinary case discussion to evaluate patient ongoing care management needs . Performed chart review to note Cardiac CT scheduled for Friday 6/4 . Unsuccessful outbound call placed to the patient to review status of holter monitor o Voice message left requesting return call  Completed 10/05/19 . Chart review performed to confirm patient engaged with RN Care Manager- see individual goals created by RN Care Manager . Noted patient cardiologist has ordered a 30 days Holter Monitor for the patient o Patient reports she has yet to receive device . Encouraged patient to contact care management team with ongoing care management and care coordination needs . Inbound call received from the patient whom reports she has just left Orthopedic office o Patient reports plan to have MRI to right shoulder due to ongoing pain  o Patient reports she has undergone PT and a round of Steroids without relief o Patient reports she underwent surgery to right shoulder approximately two years ago but is continuing to have pain after continued use  o Collaboration with RN Care Manager to report patient pain and plan for shoulder MRI . Informed by the patient she felt her heart racing over the weekend which caused her to stay awake most of the night o "My watch said it was around 120 beats"  o Patient reports  heart rate kept fluctuating and stated "even once it was back to 80 I still felt like it was off beat" o Discussed plans for SW to contact patient over the next 2 days to assess status of Holter Monitor delivery o Advised patient SW would assist in calling company on Wednesday for tracking number if equipment not yet received . Determined patient has yet to receive cal  from radiology office to schedule cardiac CT o Collaboration with [name] who requests patient contact her to conduct a joint call to patient insurance regarding test authorization o Provided patient with direct contact number to RN Care Manager  Completed 09/21/19 . Inbound call received from the patient in response to voice message left by SW . Reviewed instructions from [name] office regarding importance of contacting [name] with new onset or worsening of symptoms . Confirmed the patient is knowledgeable of how to access Apple 6 EKG feature . Scheduled follow up over the next two weeks  Completed 09/20/19 . Voice message received from [facility] with [name] office indicating [name] felt with atypical chest pain the patient would be okay to wait for radiology to perform test as originally ordered o Requested the patient contact [place] with new onset of symptoms o Requested the patient use Apple 6 watch EKG feature to monitor heart rate when palpitations occur and report back to provider office . Unsuccessful outbound call placed to the patient to review instructions from [employer] . Collaboration with [name], [place] regarding patient instructions  Completed 09/17/19 . Inter-disciplinary care team collaboration (see longitudinal plan of care) . Inbound call received from the patient in response to voice message left by SW . Patient interviewed and appropriate assessments performed o Determined the patient has been seen in the ED x 4 over the last 30 days o The patient reports seen by Cardiologist, Dr [name] on 4/26 whom ordered an MRI and CAT scan to investigate cause of patient heart palpitations . Determined the patient has yet to be contacted by imaging location to have tests performed o The patient reports she has been in contact with her health plan whom reports they have yet to receive a request for imaging studies o Patient reports she has  contacted Radiology department in which referral was sent whom reports a backlog of orders . Discussed the patient continues to have symptoms associated with atypical chest pain o The patient reports ongoing SOB and dizziness o The patient is awoken by rapid heart rate on occasion with averages of 160-180 BPM when this occurs o The patient reports she tries to relax while episode occurs o The patient stated "I did everything I was supposed to do but nothing is happening" . Advised the patient SW would outreach clinical team to address barriers related to imaging studies . Encouraged the patient to contact her cardiology team and primary care provider with ongoing symptoms as needed . Provided direct contact number to SW for the patient to contact SW directly . Collaboration with [name], RN team lead and [name], embedded RN care manager regarding patient barriers to obtain imaging studies . Collaboration with [name] 's office to report ongoing symptoms reported by the patient as well as barriers associated with ordered imaging studies o Requested follow up to determine if orders should be marked urgent or if the patient could receive imaging studies at a different location . Scheduled follow up call to the patient over the next 5 days  Patient Self Care Activities:  . Patient verbalizes understanding of plan to work with embedded care management team to address ongoing care coordination needs . Self administers medications as prescribed . Performs ADL's independently . Performs IADL's independently . Calls provider office for new concerns or questions  Please see past updates related to this goal by clicking on the "Past Updates" button in the selected goal          No SW follow up planned at this time. Patient will remain active with RN Care Manager.  Daneen Schick, BSW, CDP Social Worker, Certified Dementia Practitioner [place] [facility] [place] [facility] [phone]

## 2019-10-15 NOTE — Patient Instructions (Signed)
1.  Check myasthenia panel and MUSK antibodies, CK, aldolase 2.  Check modified barium swallow 3.  NCV-EMG  4.  Follow up after testing.

## 2019-10-15 NOTE — Patient Instructions (Signed)
Visit Information  Goals Addressed            This Visit's Progress   . COMPLETED: Collaborate with RN Care Manager to perform approrpiate assessments to determine care management and care coordination needs       CARE PLAN ENTRY (see longitudinal plan of care for additional care plan information)  Current Barriers:  . Four ED visits over the last 30 days . Patient experiencing heart palpitations without known cause . Limited knowledge of how to react to symptoms of heart palpitations due to lack of clinical diagnosis   Social Work Clinical Goal(s):  [name] Over the next 20 days the patient will receive imaging studies ordered by [name] on 08/30/19 Goal Met on 6/4 . Over the next 60 days the patient will work with embedded care management team to establish an individualized plan of care related to the management of patients ongoing medical concerns Goal Met  SW Interventions: Completed 10/15/19 . Successful outbound call placed to the patient who reports she is unavailable to complete today's call and would call SW back at a later time . Chart review performed to note patient had CT performed 6/4 . Goal Met- SW to sign off and will be available to assist with future care coordination needs should they arise  Patient Self Care Activities:  . Patient verbalizes understanding of plan to work with embedded care management team to address ongoing care coordination needs . Self administers medications as prescribed . Performs ADL's independently . Performs IADL's independently . Calls provider office for new concerns or questions  Please see past updates related to this goal by clicking on the "Past Updates" button in the selected goal         No SW follow up planned at this time. Please contact me for future resource needs.  Daneen Schick, BSW, CDP Social Worker, Certified Dementia Practitioner [place] [facility] [place] [facility] [phone]

## 2019-10-18 ENCOUNTER — Encounter: Payer: Self-pay | Admitting: Nurse Practitioner

## 2019-10-19 ENCOUNTER — Other Ambulatory Visit (INDEPENDENT_AMBULATORY_CARE_PROVIDER_SITE_OTHER): Payer: 59

## 2019-10-19 ENCOUNTER — Other Ambulatory Visit: Payer: Self-pay

## 2019-10-19 DIAGNOSIS — M791 Myalgia, unspecified site: Secondary | ICD-10-CM

## 2019-10-19 DIAGNOSIS — R1310 Dysphagia, unspecified: Secondary | ICD-10-CM

## 2019-10-19 DIAGNOSIS — R531 Weakness: Secondary | ICD-10-CM

## 2019-10-25 ENCOUNTER — Ambulatory Visit: Payer: 59 | Admitting: Nurse Practitioner

## 2019-10-26 LAB — MYASTHENIA GRAVIS PANEL 1
A CHR BINDING ABS: <0.3 nmol/L
STRIATED MUSCLE AB SCREEN: NEGATIVE

## 2019-10-28 LAB — ACHR ABS WITH REFLEX TO MUSK: AChR Binding Ab, Serum: <0.03 nmol/L (ref 0.00–0.24)

## 2019-10-28 LAB — MUSK ANTIBODIES: MuSK Antibodies: <1 U/mL

## 2019-11-05 ENCOUNTER — Ambulatory Visit
Admission: RE | Admit: 2019-11-05 | Discharge: 2019-11-05 | Disposition: A | Discharge status: Home / Self Care | Payer: 59 | Source: Ambulatory Visit | Attending: Orthopaedic Surgery | Admitting: Orthopaedic Surgery

## 2019-11-05 DIAGNOSIS — G8929 Other chronic pain: Secondary | ICD-10-CM

## 2019-11-09 ENCOUNTER — Telehealth: Payer: 59 | Admitting: Neurology

## 2019-11-11 ENCOUNTER — Other Ambulatory Visit: Payer: Self-pay

## 2019-11-11 ENCOUNTER — Other Ambulatory Visit (INDEPENDENT_AMBULATORY_CARE_PROVIDER_SITE_OTHER): Payer: 59

## 2019-11-11 ENCOUNTER — Encounter: Payer: Self-pay | Admitting: Nurse Practitioner

## 2019-11-11 DIAGNOSIS — D509 Iron deficiency anemia, unspecified: Secondary | ICD-10-CM | POA: Diagnosis not present

## 2019-11-11 LAB — IBC + FERRITIN
Ferritin: 34.7 ng/mL (ref 10.0–291.0)
Iron: 38 ug/dL — ABNORMAL LOW (ref 42–145)
Saturation Ratios: 9.1 % — ABNORMAL LOW (ref 20.0–50.0)
Transferrin: 298 mg/dL (ref 212.0–360.0)

## 2019-11-12 ENCOUNTER — Ambulatory Visit: Payer: 59 | Admitting: Orthopaedic Surgery

## 2019-11-12 ENCOUNTER — Ambulatory Visit (INDEPENDENT_AMBULATORY_CARE_PROVIDER_SITE_OTHER): Payer: 59 | Admitting: Orthopaedic Surgery

## 2019-11-12 ENCOUNTER — Encounter: Payer: Self-pay | Admitting: Orthopaedic Surgery

## 2019-11-12 DIAGNOSIS — G8929 Other chronic pain: Secondary | ICD-10-CM | POA: Diagnosis not present

## 2019-11-12 DIAGNOSIS — M25511 Pain in right shoulder: Secondary | ICD-10-CM | POA: Diagnosis not present

## 2019-11-12 MED ORDER — BUPIVACAINE HCL 0.25 % IJ SOLN
2.0000 mL | INTRAMUSCULAR | Status: AC | PRN
  Administered 2019-11-12: 2 mL via INTRA_ARTICULAR

## 2019-11-12 MED ORDER — LIDOCAINE HCL 2 % IJ SOLN
2.0000 mL | INTRAMUSCULAR | Status: AC | PRN
  Administered 2019-11-12: 2 mL

## 2019-11-12 MED ORDER — METHYLPREDNISOLONE ACETATE 40 MG/ML IJ SUSP
40.0000 mg | INTRAMUSCULAR | Status: AC | PRN
  Administered 2019-11-12: 40 mg via INTRA_ARTICULAR

## 2019-11-12 NOTE — Progress Notes (Signed)
Office Visit Note   Patient: [name]           Date of Birth: [birth year]           MRN: [id number] Visit Date: 11/12/2019              Requested by: [name], FNP [address] [place],  [place] [address] PCP: [name], FNP   Assessment & Plan: Visit Diagnoses:  1. Chronic right shoulder pain     Plan: Impression is chronic right shoulder pain cervical spine versus actual shoulder pathology.  At this point, we will proceed with diagnostic and hopefully therapeutic subacromial cortisone injection.  If she does not significantly improve with this, we have discussed cervical spine MRI.  She will call us and let us know how she does.  Follow-Up Instructions: Return if symptoms worsen or fail to improve.   Orders:  Orders Placed This Encounter  Procedures   Large Joint Inj: R subacromial bursa   No orders of the defined types were placed in this encounter.     Procedures: Large Joint Inj: R subacromial bursa on 11/12/2019 9:36 AM Indications: pain Details: 22 G needle Medications: 2 mL lidocaine 2 %; 2 mL bupivacaine 0.25 %; 40 mg methylPREDNISolone acetate 40 MG/ML Outcome: tolerated well, no immediate complications Patient was prepped and draped in the usual sterile fashion.       Clinical Data: No additional findings.   Subjective: Chief Complaint  Patient presents with   Right Shoulder - Follow-up    HPI patient is a pleasant 44 year old female who comes in today to discuss MRI results of her right shoulder.  She is approximately 2 years status post right shoulder arthroscopic debridement, distal clavicle excision, biceps tenotomy and subacromial decompression by Dr. Everardo Pacific.  She was doing well initially prior to the reappearing of her shoulder pain.  She was seen by [place] several months back but was thought that her pain could possibly be coming from her cervical spine versus her shoulder.  She was started on a steroid taper and sent to  physical therapy.  She had short lasting relief with the steroids.  She has not had a cortisone injection since surgery.  Subsequent MRI of the shoulder was obtained which shows mild supraspinatus tendinopathy without tear.  No other findings.  Today, she notes pain primarily to the superior anterior aspect of the right shoulder.  This primarily bothers her only with repetitive folding of laundry.  She denies any significant pain to her neck but does note occasional stiffness.  She has not previously had an MRI of her cervical spine.  Review of Systems as detailed in HPI.  All others reviewed and are negative.   Objective: Vital Signs: LMP 12/27/2003   Physical Exam well-developed and well-nourished female in no acute distress.  Alert and oriented x3  Ortho Exam examination of the right shoulder shows full active range of motion all planes.  Negative empty can.  Normal exam of the cervical spine.  She is neurovascularly intact distally.  Specialty Comments:  No specialty comments available.  Imaging: No new imaging   PMFS History: Patient Active Problem List   Diagnosis Date Noted   Generalized anxiety disorder 04/08/2018   Atypical chest pain    Regurgitation of food    Laryngopharyngeal reflux (LPR) 07/30/2017   Throat soreness 07/30/2017   ANA positive 12/30/2016   Gastroesophageal reflux disease 06/27/2016   Disturbance of skin sensation 04/27/2014  Dizziness and giddiness 03/14/2014   Headache, migraine 03/14/2014   Myalgia and myositis, unspecified 06/07/2013   Nausea and vomiting in adult 08/22/2012   Viral syndrome 08/22/2012   Pelvic pain in female 12/18/2010   PID (acute pelvic inflammatory disease) 12/18/2010   ANEMIA-IRON DEFICIENCY 10/15/2006   HERPES, GENITAL NOS 09/30/2006   OBESITY NOS 09/30/2006   DISORDER, NONORGANIC SLEEP NOS 09/30/2006   CONSTIPATION NOS 09/30/2006   NEURITIS, LUMBOSACRAL NOS 09/30/2006   HX, PERSONAL, MENTAL  DISORDER NOS 09/30/2006   Past Medical History:  Diagnosis Date   Allergy    Anemia    Angio-edema    Anxiety    Arthritis    Constipation    GERD (gastroesophageal reflux disease)    Hx of endometriosis    Insomnia    Macromastia 07/2011   Migraine    Urticaria     Family History  Problem Relation Age of Onset   Diabetes Mother    Hypertension Mother    Thyroid disease Sister    Heart attack Maternal Grandmother    Stroke Maternal Grandfather    Asthma Son    Asthma Daughter    Colon cancer Neg Hx    Colon polyps Neg Hx    Esophageal cancer Neg Hx    Rectal cancer Neg Hx    Stomach cancer Neg Hx     Past Surgical History:  Procedure Laterality Date   51 HOUR PH STUDY N/A 01/12/2018   Procedure: 24 HOUR PH STUDY;  Surgeon: Napoleon Form, MD;  Location: [facility];  Service: Endoscopy;  Laterality: N/A;   ABDOMINAL HYSTERECTOMY  12/12/2003   ADENOIDECTOMY  05/2011   ANKLE ARTHROSCOPY  12/29/2007   right; with extensive debridement   BLADDER NECK RECONSTRUCTION  01/14/2011   Procedure: BLADDER NECK REPAIR;  Surgeon: Reva Bores, MD;  Location: [facility];  Service: Gynecology;  Laterality: N/A;  Laparoscopic Repair of Incidental Cystotomy   BREAST REDUCTION SURGERY  08/05/2011   Procedure: MAMMARY REDUCTION  (BREAST);  Surgeon: Louisa Second, MD;  Location: [facility];  Service: Plastics;  Laterality: Bilateral;  bilateral   CESAREAN SECTION  12/29/2000; 1994   DILATION AND CURETTAGE OF UTERUS  07/08/2003   open laparoscopy   ESOPHAGEAL MANOMETRY N/A 01/12/2018   Procedure: ESOPHAGEAL MANOMETRY (EM);  Surgeon: Napoleon Form, MD;  Location: [facility];  Service: Endoscopy;  Laterality: N/A;   GIVENS CAPSULE STUDY N/A 06/23/2017   Procedure: GIVENS CAPSULE STUDY;  Surgeon: Benancio Deeds, MD;  Location: [facility];  Service: Gastroenterology;  Laterality: N/A;   LAPAROSCOPIC SALPINGOOPHERECTOMY  01/14/2011    left   PH IMPEDANCE STUDY N/A 01/12/2018   Procedure: PH IMPEDANCE STUDY;  Surgeon: Napoleon Form, MD;  Location: [facility];  Service: Endoscopy;  Laterality: N/A;   REPAIR PERONEAL TENDONS ANKLE  01/03/2010   repair right subluxing peroneal tendons   RESECTION DISTAL CLAVICAL Right 09/25/2017   Procedure: RESECTION DISTAL CLAVICAL;  Surgeon: Bjorn Pippin, MD;  Location: [facility];  Service: Orthopedics;  Laterality: Right;   RIGHT OOPHORECTOMY     with lysis of adhesions   SHOULDER ACROMIOPLASTY Right 09/25/2017   Procedure: SHOULDER ACROMIOPLASTY;  Surgeon: Bjorn Pippin, MD;  Location: [facility];  Service: Orthopedics;  Laterality: Right;   SHOULDER ARTHROSCOPY WITH DEBRIDEMENT AND BICEP TENDON REPAIR Right 09/25/2017   Procedure: SHOULDER ARTHROSCOPY WITH DEBRIDEMENT AND BICEP TENDON REPAIR;  Surgeon: Bjorn Pippin, MD;  Location: [facility];  Service: Orthopedics;  Laterality: Right;   SHOULDER ARTHROSCOPY WITH ROTATOR CUFF REPAIR Right 09/25/2017   Procedure: SHOULDER ARTHROSCOPY WITH ROTATOR CUFF REPAIR;  Surgeon: Bjorn Pippin, MD;  Location: [facility];  Service: Orthopedics;  Laterality: Right;   TONSILLECTOMY  as a child   TUBAL LIGATION  12/29/2000   Social History   Occupational History    Employer: OTHER    Comment: [employer]  Tobacco Use   Smoking status: Never Smoker   Smokeless tobacco: Never Used  Building services engineer Use: Never used  Substance and Sexual Activity   Alcohol use: Yes    Comment: occasional    Drug use: No   Sexual activity: Not on file

## 2019-11-16 ENCOUNTER — Other Ambulatory Visit: Payer: Self-pay

## 2019-11-16 ENCOUNTER — Telehealth: Payer: 59

## 2019-11-16 ENCOUNTER — Telehealth: Payer: Self-pay | Admitting: Gastroenterology

## 2019-11-16 ENCOUNTER — Ambulatory Visit: Payer: Self-pay

## 2019-11-16 DIAGNOSIS — E559 Vitamin D deficiency, unspecified: Secondary | ICD-10-CM

## 2019-11-16 DIAGNOSIS — R1319 Other dysphagia: Secondary | ICD-10-CM

## 2019-11-16 DIAGNOSIS — R079 Chest pain, unspecified: Secondary | ICD-10-CM

## 2019-11-16 DIAGNOSIS — R0609 Other forms of dyspnea: Secondary | ICD-10-CM

## 2019-11-16 DIAGNOSIS — R5383 Other fatigue: Secondary | ICD-10-CM

## 2019-11-16 DIAGNOSIS — D509 Iron deficiency anemia, unspecified: Secondary | ICD-10-CM

## 2019-11-16 DIAGNOSIS — R1310 Dysphagia, unspecified: Secondary | ICD-10-CM

## 2019-11-16 DIAGNOSIS — R0600 Dyspnea, unspecified: Secondary | ICD-10-CM

## 2019-11-16 DIAGNOSIS — G8929 Other chronic pain: Secondary | ICD-10-CM

## 2019-11-16 DIAGNOSIS — F419 Anxiety disorder, unspecified: Secondary | ICD-10-CM

## 2019-11-16 NOTE — Telephone Encounter (Signed)
Pt is requesting a call back from [place], Pt states she called a few minutes ago

## 2019-11-17 NOTE — Telephone Encounter (Signed)
Lm on vm for patient to return call 

## 2019-11-17 NOTE — Chronic Care Management (AMB) (Signed)
  Care Management   Outreach Note  11/17/2019 Name: [name] MRN: [id number] DOB: 07/15/1975  Referred by: [name], FNP Reason for referral : Care Coordination ([facility] RN CC Call )   Third unsuccessful telephone outreach was attempted today. The patient was referred to the case management team for assistance with care management and care coordination. The patient's primary care provider has been notified of our unsuccessful attempts to make or maintain contact with the patient. The care management team is pleased to engage with this patient at any time in the future should he/she be interested in assistance from the care management team.   Follow Up Plan: The care management team is available to follow up with the patient after provider conversation with the patient regarding recommendation for care management engagement and subsequent re-referral to the care management team.  No further follow up required  Delsa Sale, RN, BSN, CCM Care Management Coordinator [facility] Care Management/[facility]  Direct Phone: [phone]

## 2019-11-17 NOTE — Telephone Encounter (Signed)
Spoke with patient, see result note for more information 

## 2019-11-19 ENCOUNTER — Encounter: Payer: Self-pay | Admitting: Orthopaedic Surgery

## 2019-11-19 DIAGNOSIS — M5412 Radiculopathy, cervical region: Secondary | ICD-10-CM

## 2019-11-19 NOTE — Telephone Encounter (Signed)
Please order MRI C spine.  Thanks.

## 2019-11-22 ENCOUNTER — Other Ambulatory Visit: Payer: Self-pay | Admitting: Family Medicine

## 2019-11-22 ENCOUNTER — Other Ambulatory Visit: Payer: Self-pay

## 2019-11-22 ENCOUNTER — Ambulatory Visit (INDEPENDENT_AMBULATORY_CARE_PROVIDER_SITE_OTHER): Payer: 59 | Admitting: Family Medicine

## 2019-11-22 ENCOUNTER — Encounter: Payer: Self-pay | Admitting: Family Medicine

## 2019-11-22 VITALS — BP 118/76 | HR 70 | Resp 18 | Ht 65.0 in | Wt 244.0 lb

## 2019-11-22 DIAGNOSIS — J309 Allergic rhinitis, unspecified: Secondary | ICD-10-CM | POA: Diagnosis not present

## 2019-11-22 DIAGNOSIS — K219 Gastro-esophageal reflux disease without esophagitis: Secondary | ICD-10-CM

## 2019-11-22 DIAGNOSIS — L501 Idiopathic urticaria: Secondary | ICD-10-CM | POA: Diagnosis not present

## 2019-11-22 DIAGNOSIS — H1010 Acute atopic conjunctivitis, unspecified eye: Secondary | ICD-10-CM

## 2019-11-22 DIAGNOSIS — T7800XD Anaphylactic reaction due to unspecified food, subsequent encounter: Secondary | ICD-10-CM | POA: Diagnosis not present

## 2019-11-22 MED ORDER — EPINEPHRINE 0.3 MG/0.3ML IJ SOAJ: 0.3000 mg | INTRAMUSCULAR | 1 refills | Status: DC | PRN

## 2019-11-22 MED ORDER — CETIRIZINE HCL 10 MG PO TABS
10.0000 mg | ORAL_TABLET | Freq: Every day | ORAL | 5 refills | Status: DC | PRN
Start: 2019-11-22 — End: 2020-05-16

## 2019-11-22 NOTE — Patient Instructions (Signed)
Allergic rhintiis  Begin cetirizine 10 mg once a day as needed for a runny nose Begin Flonase 2 sprays in each nostril once a day as needed for a stuffy nose.  In the right nostril, point the applicator out toward the right ear. In the left nostril, point the applicator out toward the left ear Begin saline nasal rinses as needed for nasal symptoms. Use this before any medicated nasal sprays for best result Continue allergen avoidance measures directed toward tree pollen, dust mites, and cockroach as listed below If your symptoms have not improved with this treatment plan we will consider referral to gastroenterologist to evaluate further  Allergic conjunctivitis Some over the counter eye drops include Pataday one drop in each eye once a day as needed for red, itchy eyes OR Zaditor one drop in each eye twice a day as needed for red itchy eyes.  Hives (urticaria) Take the lowest amount of medication while remaining hive free . Cetirizine (Zyrtec) 10mg  twice a day and famotidine (Pepcid) 20 mg twice a day. If no symptoms for 7-14 days then decrease to. . Cetirizine (Zyrtec) 10mg  twice a day and famotidine (Pepcid) 20 mg once a day.  If no symptoms for 7-14 days then decrease to. . Cetirizine (Zyrtec) 10mg  twice a day.  If no symptoms for 7-14 days then decrease to. . Cetirizine (Zyrtec) 10mg  once a day.  May use Benadryl (diphenhydramine) as needed for breakthrough hives       If symptoms return, then step up dosage  Keep a detailed symptom journal including foods eaten, contact with allergens, medications taken, weather changes.   Reflux Begindietary and lifestyle modifications as listed below Begin famotidine 20 mg twice a day as listed above Continue esomeprazole as previously prescribed.  Food Your skin testing to the adult food panel was negative today Continue to avoid salmon as this was positive at your last visit.  In case of an allergic reaction, give Benadryl 50 mg every 4 hours,  and if life-threatening symptoms occur, inject with EpiPen 0.3 mg.  Call the clinic if this treatment plan is not working well for you  Follow up in 1 month or sooner if needed.  Reducing Pollen Exposure The American Academy of Allergy, Asthma and Immunology suggests the following steps to reduce your exposure to pollen during allergy seasons. 1. Do not hang sheets or clothing out to dry; pollen may collect on these items. 2. Do not mow lawns or spend time around freshly cut grass; mowing stirs up pollen. 3. Keep windows closed at night.  Keep car windows closed while driving. 4. Minimize morning activities outdoors, a time when pollen counts are usually at their highest. 5. Stay indoors as much as possible when pollen counts or humidity is high and on windy days when pollen tends to remain in the air longer. 6. Use air conditioning when possible.  Many air conditioners have filters that trap the pollen spores. 7. Use a HEPA room air filter to remove pollen form the indoor air you breathe.   Control of Dust Mite Allergen Dust mites play a major role in allergic asthma and rhinitis. They occur in environments with high humidity wherever human skin is found. Dust mites absorb humidity from the atmosphere (ie, they do not drink) and feed on organic matter (including shed human and animal skin). Dust mites are a microscopic type of insect that you cannot see with the naked eye. High levels of dust mites have been detected from mattresses, pillows,  carpets, upholstered furniture, bed covers, clothes, soft toys and any woven material. The principal allergen of the dust mite is found in its feces. A gram of dust may contain 1,000 mites and 250,000 fecal particles. Mite antigen is easily measured in the air during house cleaning activities. Dust mites do not bite and do not cause harm to humans, other than by triggering allergies/asthma.  Ways to decrease your exposure to dust mites in your home:  1.  Encase mattresses, box springs and pillows with a mite-impermeable barrier or cover  2. Wash sheets, blankets and drapes weekly in hot water (130 F) with detergent and dry them in a dryer on the hot setting.  3. Have the room cleaned frequently with a vacuum cleaner and a damp dust-mop. For carpeting or rugs, vacuuming with a vacuum cleaner equipped with a high-efficiency particulate air (HEPA) filter. The dust mite allergic individual should not be in a room which is being cleaned and should wait 1 hour after cleaning before going into the room.  4. Do not sleep on upholstered furniture (eg, couches).  5. If possible removing carpeting, upholstered furniture and drapery from the home is ideal. Horizontal blinds should be eliminated in the rooms where the person spends the most time (bedroom, study, television room). Washable vinyl, roller-type shades are optimal.  6. Remove all non-washable stuffed toys from the bedroom. Wash stuffed toys weekly like sheets and blankets above.  7. Reduce indoor humidity to less than 50%. Inexpensive humidity monitors can be purchased at most hardware stores. Do not use a humidifier as can make the problem worse and are not recommended.  Control of Cockroach Allergen  Cockroach allergen has been identified as an important cause of acute attacks of asthma, especially in urban settings.  There are fifty-five species of cockroach that exist in the [place], however only three, the [place], [place] species produce allergen that can affect patients with Asthma.  Allergens can be obtained from fecal particles, egg casings and secretions from cockroaches.    1. Remove food sources. 2. Reduce access to water. 3. Seal access and entry points. 4. Spray runways with 0.5-1% Diazinon or Chlorpyrifos 5. Blow boric acid power under stoves and refrigerator. 6. Place bait stations (hydramethylnon) at feeding sites.   Lifestyle Changes for Controlling  GERD When you have GERD, stomach acid feels as if it's backing up toward your mouth. Whether or not you take medication to control your GERD, your symptoms can often be improved with lifestyle changes.   Raise Your Head  Reflux is more likely to strike when you're lying down flat, because stomach fluid can  flow backward more easily. Raising the head of your bed 4-6 inches can help. To do this:  Slide blocks or books under the legs at the head of your bed. Or, place a wedge under  the mattress. Many foam stores can make a suitable wedge for you. The wedge  should run from your waist to the top of your head.  Don't just prop your head on several pillows. This increases pressure on your  stomach. It can make GERD worse.  Watch Your Eating Habits Certain foods may increase the acid in your stomach or relax the lower esophageal sphincter, making GERD more likely. It's best to avoid the following:  Coffee, tea, and carbonated drinks (with and without caffeine)  Fatty, fried, or spicy food  Mint, chocolate, onions, and tomatoes  Any other foods that seem to irritate your stomach  or cause you pain  Relieve the Pressure  Eat smaller meals, even if you have to eat more often.  Don't lie down right after you eat. Wait a few hours for your stomach to empty.  Avoid tight belts and tight-fitting clothes.  Lose excess weight.  Tobacco and Alcohol  Avoid smoking tobacco and drinking alcohol. They can make GERD symptoms worse.

## 2019-11-22 NOTE — Progress Notes (Signed)
.  [id number] allergy  [address] [place] [address] Dept: [phone]  FOLLOW UP NOTE  Patient ID: [name], female    DOB: February 04, 1976  Age: 44 y.o. MRN: [id number] Date of Office Visit: 11/22/2019  Assessment  Chief Complaint: Food Intolerance (she would like to have her allergies rechecked. )  HPI [name] is a 44 year old female who presents to the clinic clinic for a follow up visit.  She was last seen in this clinic on 01/09/2018 by Dr. Delorse Lek for evaluation of allergic rhinitis, allergic conjunctivitis, hives, reflux, and food allergies to salmon.  At that visit, environmental testing was positive to tree pollen, dust mite, and cockroach and food testing was positive to salmon and negative to the remainder of the panel.  At today's visit, she reports an " abnormal" feeling that occurs in her throat 30 minutes after eating.  She reports this started about 3 months ago and is not specific to any food and occurs even with water.  She reports the feeling in her throat as tight, throat pain, and ear pain.  She does report dysphagia in addition to throat tightness.  She reports this feeling does not occur if she has not eaten recently.  She is not currently avoiding any foods.  She reports that she ate salmon about a month ago and had this feeling occurred in her throat at that time as well.  She has an expired EpiPen.  Reflux is reported as moderately well controlled with no heartburn.  She continues Nexium 40 mg once a day.  Allergic rhinitis is reported as moderately well controlled with clear rhinorrhea, nasal congestion, and postnasal drainage for which she occasionally takes Claritin and is not using Flonase at this time.  She has not used a saline nasal rinse before.  Allergic conjunctivitis is reported as moderately well controlled with red, itchy, watery eyes for which she is not currently using any medical intervention.  She does report that she has breakouts of hives  occurring on both arms and legs which are moderately itchy lasting 1 day or so and occurring about once a week.  She is not currently taking an antihistamine daily or an H2 blocker.  She does continue to see pulmonary specialty for her breathing.  Her current medications are listed in the chart.  Drug Allergies:  No Known Allergies  Physical Exam: BP 118/76 (BP Location: Right Arm, Patient Position: Sitting, Cuff Size: Large)   Pulse 70   Resp 18   Ht 5\' 5"  (1.651 m)   Wt 244 lb (110.7 kg)   LMP 12/27/2003   SpO2 100%   BMI 40.60 kg/m    Physical Exam Vitals reviewed.  Constitutional:      Appearance: Normal appearance.  HENT:     Head: Normocephalic and atraumatic.     Right Ear: Tympanic membrane normal.     Left Ear: Tympanic membrane normal.     Nose:     Comments: Bilateral nares edematous and pale with clear nasal drainage noted.  Pharynx erythematous with no exudate.  Ears normal.  Eyes normal. Eyes:     Conjunctiva/sclera: Conjunctivae normal.  Cardiovascular:     Rate and Rhythm: Normal rate and regular rhythm.     Heart sounds: Normal heart sounds. No murmur heard.   Pulmonary:     Effort: Pulmonary effort is normal.     Breath sounds: Normal breath sounds.     Comments: Lungs clear to  auscultation Musculoskeletal:        General: Normal range of motion.     Cervical back: Normal range of motion and neck supple.  Skin:    General: Skin is warm and dry.     Comments: No hives on exam today  Neurological:     Mental Status: She is alert and oriented to person, place, and time.  Psychiatric:        Mood and Affect: Mood normal.        Behavior: Behavior normal.        Thought Content: Thought content normal.        Judgment: Judgment normal.     Diagnostics: Percutaneous full adult food panel was negative despite adequate controls.  Assessment and Plan: 1. Anaphylactic reaction due to food, subsequent encounter   2. Allergic rhinoconjunctivitis   3.  Idiopathic urticaria   4. Gastroesophageal reflux disease, unspecified whether esophagitis present     Meds ordered this encounter  Medications  . EPINEPHrine 0.3 mg/0.3 mL IJ SOAJ injection    Sig: Inject 0.3 mLs (0.3 mg total) into the muscle as needed for anaphylaxis.    Dispense:  2 each    Refill:  1    Please dispense Mylan or Teva brand generic only.  . cetirizine (ZYRTEC) 10 MG tablet    Sig: Take 1 tablet (10 mg total) by mouth daily as needed for rhinitis.    Dispense:  30 tablet    Refill:  5    Patient Instructions  Allergic rhintiis  Begin cetirizine 10 mg once a day as needed for a runny nose Begin Flonase 2 sprays in each nostril once a day as needed for a stuffy nose.  In the right nostril, point the applicator out toward the right ear. In the left nostril, point the applicator out toward the left ear Begin saline nasal rinses as needed for nasal symptoms. Use this before any medicated nasal sprays for best result Continue allergen avoidance measures directed toward tree pollen, dust mites, and cockroach as listed below If your symptoms have not improved with this treatment plan we will consider referral to gastroenterologist to evaluate further  Allergic conjunctivitis Some over the counter eye drops include Pataday one drop in each eye once a day as needed for red, itchy eyes OR Zaditor one drop in each eye twice a day as needed for red itchy eyes.  Hives (urticaria) Take the lowest amount of medication while remaining hive free . Cetirizine (Zyrtec) 10mg  twice a day and famotidine (Pepcid) 20 mg twice a day. If no symptoms for 7-14 days then decrease to. . Cetirizine (Zyrtec) 10mg  twice a day and famotidine (Pepcid) 20 mg once a day.  If no symptoms for 7-14 days then decrease to. . Cetirizine (Zyrtec) 10mg  twice a day.  If no symptoms for 7-14 days then decrease to. . Cetirizine (Zyrtec) 10mg  once a day.  May use Benadryl (diphenhydramine) as needed for  breakthrough hives       If symptoms return, then step up dosage  Keep a detailed symptom journal including foods eaten, contact with allergens, medications taken, weather changes.   Reflux Begindietary and lifestyle modifications as listed below Begin famotidine 20 mg twice a day as listed above Continue esomeprazole as previously prescribed.  Food Your skin testing to the adult food panel was negative today Continue to avoid salmon as this was positive at your last visit.  In case of an allergic reaction, give Benadryl 50  mg every 4 hours, and if life-threatening symptoms occur, inject with EpiPen 0.3 mg.  Call the clinic if this treatment plan is not working well for you  Follow up in 1 month or sooner if needed.  Return in about 4 weeks (around 12/20/2019), or if symptoms worsen or fail to improve.    Thank you for the opportunity to care for this patient.  Please do not hesitate to contact me with questions.  Thermon Leyland, FNP [facility]

## 2019-11-25 LAB — ALLERGEN PROFILE, BASIC FOOD
Allergen Corn, IgE: <0.1 kU/L
Beef IgE: <0.1 kU/L
Chocolate/Cacao IgE: <0.1 kU/L
Egg, Whole IgE: <0.1 kU/L
Food Mix (Seafoods) IgE: NEGATIVE
Milk IgE: <0.1 kU/L
Peanut IgE: <0.1 kU/L
Pork IgE: <0.1 kU/L
Soybean IgE: <0.1 kU/L
Wheat IgE: <0.1 kU/L

## 2019-11-25 LAB — IGE: IgE (Immunoglobulin E), Serum: 40 IU/mL (ref 6–495)

## 2019-11-25 LAB — ALLERGEN, SALMON, F41: Allergen Salmon IgE: <0.1 kU/L

## 2019-11-30 NOTE — Progress Notes (Signed)
Can you please let this patient know that her lab results have returned and all foods tested were negative (milk, wheat, corn, peanut, soybean, pork, beef, seafood mix, egg, chocolate, and salmon. Please have her continue to avoid salmon as she has had symptoms after consuming this food previously. Can you please offer her an in office food challenge to salmon as she had negative skin testing and negative lab. Remind her to stop all antihistamines for 3 days before the salmon challenge. Thank you

## 2019-12-01 ENCOUNTER — Other Ambulatory Visit: Payer: Self-pay

## 2019-12-01 ENCOUNTER — Other Ambulatory Visit (INDEPENDENT_AMBULATORY_CARE_PROVIDER_SITE_OTHER): Payer: 59

## 2019-12-01 DIAGNOSIS — R202 Paresthesia of skin: Secondary | ICD-10-CM

## 2019-12-01 DIAGNOSIS — R200 Anesthesia of skin: Secondary | ICD-10-CM

## 2019-12-02 ENCOUNTER — Telehealth: Payer: Self-pay

## 2019-12-02 LAB — VITAMIN B12: Vitamin B-12: 1249 pg/mL — ABNORMAL HIGH (ref 211–911)

## 2019-12-02 NOTE — Telephone Encounter (Signed)
LMOVM to call back 

## 2019-12-02 NOTE — Telephone Encounter (Signed)
-----   Message from Drema Dallas, DO sent at 12/02/2019  2:12 PM EDT ----- B12 level is okay

## 2019-12-03 NOTE — Telephone Encounter (Signed)
Telephone call back to pt, Advised of Lab results

## 2019-12-03 NOTE — Telephone Encounter (Signed)
Patient called back in and left a message with the Access Nurse returning our call 

## 2019-12-05 ENCOUNTER — Encounter (HOSPITAL_COMMUNITY): Payer: Self-pay | Admitting: Emergency Medicine

## 2019-12-05 ENCOUNTER — Emergency Department (HOSPITAL_COMMUNITY): Payer: 59

## 2019-12-05 ENCOUNTER — Emergency Department (HOSPITAL_COMMUNITY)
Admission: EM | Admit: 2019-12-05 | Discharge: 2019-12-05 | Disposition: A | Discharge status: Home / Self Care | Payer: 59 | Attending: Emergency Medicine | Admitting: Emergency Medicine

## 2019-12-05 ENCOUNTER — Other Ambulatory Visit: Payer: Self-pay

## 2019-12-05 DIAGNOSIS — Z5321 Procedure and treatment not carried out due to patient leaving prior to being seen by health care provider: Secondary | ICD-10-CM | POA: Insufficient documentation

## 2019-12-05 DIAGNOSIS — R002 Palpitations: Secondary | ICD-10-CM | POA: Diagnosis not present

## 2019-12-05 LAB — CBC
HCT: 33.3 % — ABNORMAL LOW (ref 36.0–46.0)
Hemoglobin: 10.4 g/dL — ABNORMAL LOW (ref 12.0–15.0)
MCH: 24.6 pg — ABNORMAL LOW (ref 26.0–34.0)
MCHC: 31.2 g/dL (ref 30.0–36.0)
MCV: 78.7 fL — ABNORMAL LOW (ref 80.0–100.0)
Platelets: 318 10*3/uL (ref 150–400)
RBC: 4.23 MIL/uL (ref 3.87–5.11)
RDW: 14.2 % (ref 11.5–15.5)
WBC: 4.7 10*3/uL (ref 4.0–10.5)
nRBC: 0 % (ref 0.0–0.2)

## 2019-12-05 LAB — BASIC METABOLIC PANEL
Anion gap: 8 (ref 5–15)
BUN: 9 mg/dL (ref 6–20)
CO2: 26 mmol/L (ref 22–32)
Calcium: 8.8 mg/dL — ABNORMAL LOW (ref 8.9–10.3)
Chloride: 102 mmol/L (ref 98–111)
Creatinine, Ser: 0.81 mg/dL (ref 0.44–1.00)
GFR calc Af Amer: >60 mL/min (ref >60)
GFR calc non Af Amer: >60 mL/min (ref >60)
Glucose, Bld: 93 mg/dL (ref 70–99)
Potassium: 3.7 mmol/L (ref 3.5–5.1)
Sodium: 136 mmol/L (ref 135–145)

## 2019-12-05 LAB — I-STAT BETA HCG BLOOD, ED (MC, WL, AP ONLY): I-stat hCG, quantitative: <5 m[IU]/mL (ref <5)

## 2019-12-05 LAB — TROPONIN I (HIGH SENSITIVITY): Troponin I (High Sensitivity): <2 ng/L (ref <18)

## 2019-12-05 MED ORDER — SODIUM CHLORIDE 0.9% FLUSH: 3.0000 mL | Freq: Once | INTRAVENOUS | Status: DC

## 2019-12-05 NOTE — ED Notes (Signed)
Called pt x3 for vital recheck no answer 

## 2019-12-05 NOTE — ED Triage Notes (Signed)
Patient reports intermittent palpitations for several days , denies chest pain , respirations unlabored , denies emesis or dizziness.

## 2019-12-06 ENCOUNTER — Other Ambulatory Visit: Payer: Self-pay

## 2019-12-06 ENCOUNTER — Encounter (HOSPITAL_COMMUNITY): Payer: Self-pay

## 2019-12-06 ENCOUNTER — Ambulatory Visit (HOSPITAL_COMMUNITY)
Admission: EM | Admit: 2019-12-06 | Discharge: 2019-12-06 | Disposition: A | Discharge status: Home / Self Care | Payer: 59 | Attending: Family Medicine | Admitting: Family Medicine

## 2019-12-06 ENCOUNTER — Encounter (HOSPITAL_COMMUNITY): Payer: Self-pay | Admitting: Emergency Medicine

## 2019-12-06 ENCOUNTER — Emergency Department (HOSPITAL_COMMUNITY)
Admission: EM | Admit: 2019-12-06 | Discharge: 2019-12-07 | Disposition: A | Discharge status: Home / Self Care | Payer: 59 | Attending: Emergency Medicine | Admitting: Emergency Medicine

## 2019-12-06 DIAGNOSIS — H60391 Other infective otitis externa, right ear: Secondary | ICD-10-CM

## 2019-12-06 DIAGNOSIS — M26621 Arthralgia of right temporomandibular joint: Secondary | ICD-10-CM | POA: Diagnosis not present

## 2019-12-06 DIAGNOSIS — R519 Headache, unspecified: Secondary | ICD-10-CM

## 2019-12-06 DIAGNOSIS — Z5321 Procedure and treatment not carried out due to patient leaving prior to being seen by health care provider: Secondary | ICD-10-CM | POA: Insufficient documentation

## 2019-12-06 DIAGNOSIS — M79661 Pain in right lower leg: Secondary | ICD-10-CM | POA: Insufficient documentation

## 2019-12-06 MED ORDER — PREDNISONE 20 MG PO TABS: 20.0000 mg | ORAL_TABLET | Freq: Two times a day (BID) | ORAL | 0 refills | Status: DC

## 2019-12-06 MED ORDER — HYDROCODONE-ACETAMINOPHEN 7.5-325 MG PO TABS: 1.0000 | ORAL_TABLET | Freq: Four times a day (QID) | ORAL | 0 refills | Status: DC | PRN

## 2019-12-06 MED ORDER — NEOMYCIN-POLYMYXIN-HC 3.5-10000-1 OT SUSP
4.0000 [drp] | Freq: Three times a day (TID) | OTIC | 0 refills | Status: DC
Start: 2019-12-06 — End: 2019-12-30

## 2019-12-06 NOTE — ED Triage Notes (Signed)
Pt c/o right sided pain, she states her face, behind her ears, her neck and shoulder all hurt x 3 days. Pt also states she has had a headache. She states she has had a previous surgery on that shoulder.

## 2019-12-06 NOTE — Discharge Instructions (Addendum)
Take the prednisone 2 x a day Take 2 doses today Use ear drops in both ears for a few days Take pain medicine as needed Do not drive on the pain medicine See your doctor in follow up

## 2019-12-06 NOTE — ED Triage Notes (Signed)
Arrived POV from home. Patient seen at [facility] urgent care today. Patient reports pain in right lower leg for past few weeks, but pain is worse tonight.

## 2019-12-06 NOTE — ED Provider Notes (Signed)
[facility]    CSN: [id number] Arrival date & time: 12/06/19  1624      History   Chief Complaint Chief Complaint  Patient presents with  . Headache  . Shoulder Pain    HPI [name] is a 44 y.o. female.   HPI  Patient is here for right ear pain.  She states it hurts on the right side of her face and her right ear.  Hearing is normal.  No fever or chills.  No visual problems, eye crusting, eye difficulty.  No runny or stuffy nose, sinus congestion, or sore throat.  No dental problems.  It hurts to open her mouth.  Hurts when she chews.  She is also complaining of pain that goes from the right side of her face down into her neck.  She states she does have allergies.  She went to an ENT a couple weeks ago.  Thorough evaluation did not reveal any medical problems.  She is compliant with her asthma and allergy medications.  Past Medical History:  Diagnosis Date  . Allergy   . Anemia   . Angio-edema   . Anxiety   . Arthritis   . Constipation   . GERD (gastroesophageal reflux disease)   . Hx of endometriosis   . Insomnia   . Macromastia 07/2011  . Migraine   . Urticaria     Patient Active Problem List   Diagnosis Date Noted  . Anaphylactic reaction due to food, subsequent encounter 11/22/2019  . Allergic rhinoconjunctivitis 11/22/2019  . Idiopathic urticaria 11/22/2019  . Generalized anxiety disorder 04/08/2018  . Atypical chest pain   . Regurgitation of food   . Laryngopharyngeal reflux (LPR) 07/30/2017  . Throat soreness 07/30/2017  . ANA positive 12/30/2016  . Gastroesophageal reflux disease 06/27/2016  . Disturbance of skin sensation 04/27/2014  . Dizziness and giddiness 03/14/2014  . Headache, migraine 03/14/2014  . Myalgia and myositis, unspecified 06/07/2013  . Nausea and vomiting in adult 08/22/2012  . Viral syndrome 08/22/2012  . Pelvic pain in female 12/18/2010  . PID (acute pelvic inflammatory disease) 12/18/2010  . ANEMIA-IRON  DEFICIENCY 10/15/2006  . HERPES, GENITAL NOS 09/30/2006  . OBESITY NOS 09/30/2006  . DISORDER, NONORGANIC SLEEP NOS 09/30/2006  . CONSTIPATION NOS 09/30/2006  . NEURITIS, LUMBOSACRAL NOS 09/30/2006  . HX, PERSONAL, MENTAL DISORDER NOS 09/30/2006    Past Surgical History:  Procedure Laterality Date  . 24 HOUR PH STUDY N/A 01/12/2018   Procedure: 24 HOUR PH STUDY;  Surgeon: Napoleon Form, MD;  Location: [facility];  Service: Endoscopy;  Laterality: N/A;  . ABDOMINAL HYSTERECTOMY  12/12/2003  . ADENOIDECTOMY  05/2011  . ANKLE ARTHROSCOPY  12/29/2007   right; with extensive debridement  . BLADDER NECK RECONSTRUCTION  01/14/2011   Procedure: BLADDER NECK REPAIR;  Surgeon: Reva Bores, MD;  Location: [facility];  Service: Gynecology;  Laterality: N/A;  Laparoscopic Repair of Incidental Cystotomy  . BREAST REDUCTION SURGERY  08/05/2011   Procedure: MAMMARY REDUCTION  (BREAST);  Surgeon: Louisa Second, MD;  Location: [facility];  Service: Plastics;  Laterality: Bilateral;  bilateral  . CESAREAN SECTION  12/29/2000; 1994  . DILATION AND CURETTAGE OF UTERUS  07/08/2003   open laparoscopy  . ESOPHAGEAL MANOMETRY N/A 01/12/2018   Procedure: ESOPHAGEAL MANOMETRY (EM);  Surgeon: Napoleon Form, MD;  [facility];  Service: Endoscopy;  Laterality: N/A;  . GIVENS CAPSULE STUDY N/A 06/23/2017   Procedure: GIVENS CAPSULE STUDY;  Surgeon:  Armbruster, Willaim Rayas, MD;  Location: [facility];  Service: Gastroenterology;  Laterality: N/A;  . LAPAROSCOPIC SALPINGOOPHERECTOMY  01/14/2011   left  . PH IMPEDANCE STUDY N/A 01/12/2018   Procedure: PH IMPEDANCE STUDY;  Surgeon: Napoleon Form, MD;  Location: [facility];  Service: Endoscopy;  Laterality: N/A;  . REPAIR PERONEAL TENDONS ANKLE  01/03/2010   repair right subluxing peroneal tendons  . RESECTION DISTAL CLAVICAL Right 09/25/2017   Procedure: RESECTION DISTAL CLAVICAL;  Surgeon: Bjorn Pippin, MD;  Location: [facility];  Service: Orthopedics;  Laterality: Right;  . RIGHT OOPHORECTOMY     with lysis of adhesions  . SHOULDER ACROMIOPLASTY Right 09/25/2017   Procedure: SHOULDER ACROMIOPLASTY;  Surgeon: Bjorn Pippin, MD;  Location: [facility];  Service: Orthopedics;  Laterality: Right;  . SHOULDER ARTHROSCOPY WITH DEBRIDEMENT AND BICEP TENDON REPAIR Right 09/25/2017   Procedure: SHOULDER ARTHROSCOPY WITH DEBRIDEMENT AND BICEP TENDON REPAIR;  Surgeon: Bjorn Pippin, MD;  Location: [facility];  Service: Orthopedics;  Laterality: Right;  . SHOULDER ARTHROSCOPY WITH ROTATOR CUFF REPAIR Right 09/25/2017   Procedure: SHOULDER ARTHROSCOPY WITH ROTATOR CUFF REPAIR;  Surgeon: Bjorn Pippin, MD;  Location: [facility];  Service: Orthopedics;  Laterality: Right;  . TONSILLECTOMY  as a child  . TUBAL LIGATION  12/29/2000    OB History    Gravida  2   Para  2   Term  2   Preterm      AB      Living  2     SAB      TAB      Ectopic      Multiple      Live Births               Home Medications    Prior to Admission medications   Medication Sig Start Date End Date Taking? Authorizing Provider  albuterol (VENTOLIN HFA) 108 (90 Base) MCG/ACT inhaler Inhale 2 puffs into the lungs every 6 (six) hours as needed for wheezing or shortness of breath.    [provider]  cetirizine (ZYRTEC) 10 MG tablet Take 1 tablet (10 mg total) by mouth daily as needed for rhinitis. 11/22/19   Hetty Blend, FNP  EPINEPHrine 0.3 mg/0.3 mL IJ SOAJ injection Inject 0.3 mLs (0.3 mg total) into the muscle as needed for anaphylaxis. 11/22/19   Hetty Blend, FNP  esomeprazole (NEXIUM) 40 MG capsule Take 1 capsule (40 mg total) by mouth daily at 12 noon. 10/05/19   Armbruster, Willaim Rayas, MD  estradiol (CLIMARA - DOSED IN MG/24 HR) 0.1 mg/24hr patch Place 0.1 mg onto the skin once a week. 10/21/19   [provider]  famotidine (PEPCID) 20 MG tablet famotidine 20 mg  tablet  TAKE 1 TABLET AT BEDTIME    [provider]  fluticasone furoate-vilanterol (BREO ELLIPTA) 100-25 MCG/INH AEPB Inhale 1 puff into the lungs daily. 09/24/19   Charlott Holler, MD  HYDROcodone-acetaminophen (NORCO) 7.5-325 MG tablet Take 1 tablet by mouth every 6 (six) hours as needed for moderate pain. 12/06/19   Eustace Moore, MD  neomycin-polymyxin-hydrocortisone (CORTISPORIN) 3.5-10000-1 OTIC suspension Place 4 drops into both ears 3 (three) times daily. 12/06/19   Eustace Moore, MD  predniSONE (DELTASONE) 20 MG tablet Take 1 tablet (20 mg total) by mouth 2 (two) times daily with a meal. 12/06/19   Eustace Moore, MD  RESTASIS 0.05 % ophthalmic emulsion  1 drop 2 (two) times daily. 11/04/19   [provider]  Vitamin D, Ergocalciferol, (DRISDOL) 1.25 MG (50000 UNIT) CAPS capsule Take 50,000 Units by mouth 2 (two) times a week. 10/11/19   [provider]  zolpidem (AMBIEN CR) 12.5 MG CR tablet Take 12.5 mg by mouth at bedtime as needed. 09/13/19   [provider]  ferrous sulfate 325 (65 FE) MG EC tablet Take 1 tablet (325 mg total) by mouth 2 (two) times daily with a meal. 02/05/17 08/25/19  Armbruster, Willaim Rayas, MD  Fluticasone-Salmeterol (ADVAIR DISKUS) 250-50 MCG/DOSE AEPB Advair Diskus 250 mcg-50 mcg/dose powder for inhalation  10/06/19  [provider]  loratadine (CLARITIN) 10 MG tablet Take 1 tablet (10 mg total) by mouth daily. 12/24/17 11/22/19  Kallie Locks, FNP    Family History Family History  Problem Relation Age of Onset  . Diabetes Mother   . Hypertension Mother   . Thyroid disease Sister   . Heart attack Maternal Grandmother   . Stroke Maternal Grandfather   . Asthma Son   . Asthma Daughter   . Colon cancer Neg Hx   . Colon polyps Neg Hx   . Esophageal cancer Neg Hx   . Rectal cancer Neg Hx   . Stomach cancer Neg Hx     Social History Social History   Tobacco Use  . Smoking status: Never Smoker  . Smokeless  tobacco: Never Used  Vaping Use  . Vaping Use: Never used  Substance Use Topics  . Alcohol use: Yes    Comment: occasional   . Drug use: No     Allergies   Rosanne Ashing allergy]   Review of Systems Review of Systems See HPI  Physical Exam Triage Vital Signs ED Triage Vitals  Enc Vitals Group     BP 12/06/19 1708 134/89     Pulse Rate 12/06/19 1708 66     Resp 12/06/19 1708 17     Temp 12/06/19 1708 98.2 F (36.8 C)     Temp Source 12/06/19 1708 Oral     SpO2 12/06/19 1708 98 %     Weight --      Height --      Head Circumference --      Peak Flow --      Pain Score 12/06/19 1710 9     Pain Loc --      Pain Edu? --      Excl. in GC? --    No data found.  Updated Vital Signs BP 134/89 (BP Location: Right Arm)   Pulse 66   Temp 98.2 F (36.8 C) (Oral)   Resp 17   LMP 12/27/2003   SpO2 98%      Physical Exam Constitutional:      General: She is in acute distress.     Appearance: She is well-developed.     Comments: Tearful.  Clearly uncomfortable.  HENT:     Head: Normocephalic and atraumatic.     Comments: Tenderness over TMJ.  Pain with opening of mouth    Right Ear: Ear canal and external ear normal.     Left Ear: Ear canal and external ear normal.     Ears:     Comments: TMs are clear.  Both canals are erythematous right greater than left    Nose: Nose normal. No congestion.     Mouth/Throat:     Mouth: Mucous membranes are moist.  Eyes:     Conjunctiva/sclera:  Conjunctivae normal.     Pupils: Pupils are equal, round, and reactive to light.  Cardiovascular:     Rate and Rhythm: Normal rate.  Pulmonary:     Effort: Pulmonary effort is normal. No respiratory distress.  Abdominal:     General: There is no distension.     Palpations: Abdomen is soft.  Musculoskeletal:        General: Normal range of motion.     Cervical back: Normal range of motion.  Lymphadenopathy:     Cervical: No cervical adenopathy.  Skin:    General: Skin is warm and  dry.  Neurological:     Mental Status: She is alert.  Psychiatric:        Mood and Affect: Mood normal.        Behavior: Behavior normal.      UC Treatments / Results  Labs (all labs ordered are listed, but only abnormal results are displayed) Labs Reviewed - No data to display  EKG Radiology  Procedures Procedures (including critical care time)  Medications Ordered in UC Medications - No data to display  Initial Impression / Assessment and Plan / UC Course  I have reviewed the triage vital signs and the nursing notes.  Pertinent labs & imaging results that were available during my care of the patient were reviewed by me and considered in my medical decision making (see chart for details).     Patient has ear pain.  Her ear canals are red but her TMs are okay.  We will give her Cortisporin to treat this patient has TMJ tenderness.  Pain with opening mouth.  Pain with chewing.  We will give her steroids  Patient has significant pain.  Not relieved with ibuprofen or Tylenol.  We will give her a limited number of Norco.  Follow-up with PCP Final Clinical Impressions(s) / UC Diagnoses   Final diagnoses:  TMJ tenderness, right  Right-sided face pain  Infective otitis externa of right ear     Discharge Instructions     Take the prednisone 2 x a day Take 2 doses today Use ear drops in both ears for a few days Take pain medicine as needed Do not drive on the pain medicine See your doctor in follow up   ED Prescriptions    Medication Sig Dispense Auth. Provider   predniSONE (DELTASONE) 20 MG tablet Take 1 tablet (20 mg total) by mouth 2 (two) times daily with a meal. 10 tablet Eustace MooreNelson, Demontrae Gilbert Sue, MD   neomycin-polymyxin-hydrocortisone (CORTISPORIN) 3.5-10000-1 OTIC suspension Place 4 drops into both ears 3 (three) times daily. 10 mL Eustace MooreNelson, Cameron Katayama Sue, MD   HYDROcodone-acetaminophen [facility](NORCO) 7.5-325 MG tablet Take 1 tablet by mouth every 6 (six) hours as needed for  moderate pain. 15 tablet Eustace MooreNelson, Maveryck Bahri Sue, MD     I have reviewed the PDMP during this encounter.   Eustace MooreNelson, Liban Guedes Sue, MD 12/06/19 [phone]

## 2019-12-08 ENCOUNTER — Other Ambulatory Visit: Payer: Self-pay

## 2019-12-08 ENCOUNTER — Encounter: Payer: Self-pay | Admitting: Cardiovascular Disease

## 2019-12-08 ENCOUNTER — Ambulatory Visit (INDEPENDENT_AMBULATORY_CARE_PROVIDER_SITE_OTHER): Payer: 59 | Admitting: Cardiovascular Disease

## 2019-12-08 VITALS — BP 115/64 | HR 81 | Temp 96.8°F | Ht 64.0 in | Wt 248.6 lb

## 2019-12-08 DIAGNOSIS — R000 Tachycardia, unspecified: Secondary | ICD-10-CM | POA: Diagnosis not present

## 2019-12-08 DIAGNOSIS — I4711 Inappropriate sinus tachycardia, so stated: Secondary | ICD-10-CM | POA: Insufficient documentation

## 2019-12-08 DIAGNOSIS — R0681 Apnea, not elsewhere classified: Secondary | ICD-10-CM

## 2019-12-08 DIAGNOSIS — R400 Somnolence: Secondary | ICD-10-CM | POA: Diagnosis not present

## 2019-12-08 HISTORY — DX: Apnea, not elsewhere classified: R06.81

## 2019-12-08 HISTORY — DX: Tachycardia, unspecified: R00.0

## 2019-12-08 HISTORY — DX: Inappropriate sinus tachycardia, so stated: I47.11

## 2019-12-08 HISTORY — DX: Morbid (severe) obesity due to excess calories: E66.01

## 2019-12-08 NOTE — Progress Notes (Signed)
Cardiology Office Note   Date:  12/08/2019   ID:  [name], DOB [birth year], MRN [id number]  PCP:  [name], FNP  Cardiologist:   Chilton Si, MD   No chief complaint on file.    History of Present Illness: [name] is a 44 y.o. female who is being seen today for follow up.  She was initially seen 01/2017 for the evaluation of chest pain.  She has been seen in the emergency department several times with various complaints including chest pain, arm numbness, trouble swallowing, hives, hair loss, dysphagia, right hand and arm numbness and tingling.  Cardiac enzymes have been negative. EKGs have been negative for ischemia and showed sinus rhythm and tachycardia.  She was referred for ETT 01/24/17 that was negative for ischemia.  She achieved 11 METS on a Bruce protocol.  She also reported lower extremity edema, but none was noted on exam.  [name] was seen in the ED 02/19/17 with chest pain.  Cardiac enzymes were negative and chest CT-A was negative for PE.  No coronary calcification was noted.  She was referred for pulmonary function testing 03/2017 that was unremarkable.  She was given a prescription for metoprolol to help her palpitations.  However it made her feel tired so she stopped taking it.  She saw [name] 08/2019 with palpitations.  She wore an ambulatory monitor.  She was found to have sinus tachycardia and rare PVCs.  She reported chest pain at which time her heart rate was usually elevated.  She uses her Apple Watch and notes that her heart rate goes up to 170s at time.  She doesn't drink caffeine.  She has not been feeling stressed.  She struggles with pain in both legs.  It occurs when walking and doesn't go away until she lays down.  It is a burning pain and sometimes sharp.  She also struggles with R shoulder pain. Lately she hasn't had any chest pain or pressure.  She had a coronary CT-A 10/2019 that revealed a calcium score of 0 and no CAD.  She  reports feeling chronically tired.  She attributes this to low iron levels.  She is unsure whether she snores.  She does not feel well-rested when she awakens in the morning and sometimes catches herself not breathing at night.  She falls asleep easily during the day.  She notes that her son has sleep apnea and recently got a machine.   Past Medical History:  Diagnosis Date  . Allergy   . Anemia   . Angio-edema   . Anxiety   . Apnea 12/08/2019  . Arthritis   . Constipation   . GERD (gastroesophageal reflux disease)   . Hx of endometriosis   . Inappropriate sinus tachycardia 12/08/2019  . Insomnia   . Macromastia 07/2011  . Migraine   . Morbid obesity (HCC) 12/08/2019  . Urticaria     Past Surgical History:  Procedure Laterality Date  . 24 HOUR PH STUDY N/A 01/12/2018   Procedure: 24 HOUR PH STUDY;  Surgeon: Napoleon Form, MD;  [facility];  Service: Endoscopy;  Laterality: N/A;  . ABDOMINAL HYSTERECTOMY  12/12/2003  . ADENOIDECTOMY  05/2011  . ANKLE ARTHROSCOPY  12/29/2007   right; with extensive debridement  . BLADDER NECK RECONSTRUCTION  01/14/2011   Procedure: BLADDER NECK REPAIR;  Surgeon: Reva Bores, MD;  Location: [facility];  Service: Gynecology;  Laterality: N/A;  Laparoscopic Repair of Incidental Cystotomy  .  BREAST REDUCTION SURGERY  08/05/2011   Procedure: MAMMARY REDUCTION  (BREAST);  Surgeon: Louisa SecondGerald Truesdale, MD;  Location: [facility];  Service: Plastics;  Laterality: Bilateral;  bilateral  . CESAREAN SECTION  12/29/2000; 1994  . DILATION AND CURETTAGE OF UTERUS  07/08/2003   open laparoscopy  . ESOPHAGEAL MANOMETRY N/A 01/12/2018   Procedure: ESOPHAGEAL MANOMETRY (EM);  Surgeon: Napoleon FormNandigam, Kavitha V, MD;  [facility];  Service: Endoscopy;  Laterality: N/A;  . GIVENS CAPSULE STUDY N/A 06/23/2017   Procedure: GIVENS CAPSULE STUDY;  Surgeon: Benancio DeedsArmbruster, Steven P, MD;  [facility];  Service: Gastroenterology;  Laterality: N/A;  .  LAPAROSCOPIC SALPINGOOPHERECTOMY  01/14/2011   left  . PH IMPEDANCE STUDY N/A 01/12/2018   Procedure: PH IMPEDANCE STUDY;  Surgeon: Napoleon FormNandigam, Kavitha V, MD;  [facility];  Service: Endoscopy;  Laterality: N/A;  . REPAIR PERONEAL TENDONS ANKLE  01/03/2010   repair right subluxing peroneal tendons  . RESECTION DISTAL CLAVICAL Right 09/25/2017   Procedure: RESECTION DISTAL CLAVICAL;  Surgeon: Bjorn PippinVarkey, Dax T, MD;  Location: [facility];  Service: Orthopedics;  Laterality: Right;  . RIGHT OOPHORECTOMY     with lysis of adhesions  . SHOULDER ACROMIOPLASTY Right 09/25/2017   Procedure: SHOULDER ACROMIOPLASTY;  Surgeon: Bjorn PippinVarkey, Dax T, MD;  Location: [facility];  Service: Orthopedics;  Laterality: Right;  . SHOULDER ARTHROSCOPY WITH DEBRIDEMENT AND BICEP TENDON REPAIR Right 09/25/2017   Procedure: SHOULDER ARTHROSCOPY WITH DEBRIDEMENT AND BICEP TENDON REPAIR;  Surgeon: Bjorn PippinVarkey, Dax T, MD;  Location: [facility];  Service: Orthopedics;  Laterality: Right;  . SHOULDER ARTHROSCOPY WITH ROTATOR CUFF REPAIR Right 09/25/2017   Procedure: SHOULDER ARTHROSCOPY WITH ROTATOR CUFF REPAIR;  Surgeon: Bjorn PippinVarkey, Dax T, MD;  Location: [facility];  Service: Orthopedics;  Laterality: Right;  . TONSILLECTOMY  as a child  . TUBAL LIGATION  12/29/2000     Current Outpatient Medications  Medication Sig Dispense Refill  . albuterol (VENTOLIN HFA) 108 (90 Base) MCG/ACT inhaler Inhale 2 puffs into the lungs every 6 (six) hours as needed for wheezing or shortness of breath.    . cetirizine (ZYRTEC) 10 MG tablet Take 1 tablet (10 mg total) by mouth daily as needed for rhinitis. 30 tablet 5  . esomeprazole (NEXIUM) 40 MG capsule Take 1 capsule (40 mg total) by mouth daily at 12 noon. 30 capsule 6  . fluticasone furoate-vilanterol (BREO ELLIPTA) 100-25 MCG/INH AEPB Inhale 1 puff into the lungs daily. 1 each 11  . HYDROcodone-acetaminophen (NORCO) 7.5-325 MG tablet Take 1  tablet by mouth every 6 (six) hours as needed for moderate pain. 15 tablet 0  . neomycin-polymyxin-hydrocortisone (CORTISPORIN) 3.5-10000-1 OTIC suspension Place 4 drops into both ears 3 (three) times daily. 10 mL 0  . predniSONE (DELTASONE) 20 MG tablet Take 1 tablet (20 mg total) by mouth 2 (two) times daily with a meal. 10 tablet 0  . RESTASIS 0.05 % ophthalmic emulsion 1 drop 2 (two) times daily.    . Vitamin D, Ergocalciferol, (DRISDOL) 1.25 MG (50000 UNIT) CAPS capsule Take 50,000 Units by mouth 2 (two) times a week.    . zolpidem (AMBIEN CR) 12.5 MG CR tablet Take 12.5 mg by mouth at bedtime as needed.     No current facility-administered medications for this visit.    Allergies:   Rosanne AshingSalmon [fish allergy]    Social History:  The patient  reports that she has never smoked. She has never used smokeless tobacco. She  reports current alcohol use. She reports that she does not use drugs.   Family History:  The patient's family history includes Asthma in her daughter and son; Diabetes in her mother; Heart attack in her maternal grandmother; Hypertension in her mother; Stroke in her maternal grandfather; Thyroid disease in her sister.    ROS:  Please see the history of present illness.   Otherwise, review of systems are positive for feet cramping.   All other systems are reviewed and negative.    PHYSICAL EXAM: VS:  BP 115/64   Pulse 81   Temp (!) 96.8 F (36 C)   Ht 5\' 4"  (1.626 m)   Wt 248 lb 9.6 oz (112.8 kg)   LMP 12/27/2003   SpO2 97%   BMI 42.67 kg/m  , BMI Body mass index is 42.67 kg/m. GENERAL:  Well appearing HEENT: Pupils equal round and reactive, fundi not visualized, oral mucosa unremarkable NECK:  No jugular venous distention, waveform within normal limits, carotid upstroke brisk and symmetric, no bruits LUNGS:  Clear to auscultation bilaterally HEART:  RRR.  PMI not displaced or sustained,S1 and S2 within normal limits, no S3, no S4, no clicks, no rubs, no  murmurs ABD:  Flat, positive bowel sounds normal in frequency in pitch, no bruits, no rebound, no guarding, no midline pulsatile mass, no hepatomegaly, no splenomegaly EXT:  2 plus pulses throughout, no edema, no cyanosis no clubbing SKIN:  No rashes no nodules NEURO:  Cranial nerves II through XII grossly intact, motor grossly intact throughout PSYCH:  Cognitively intact, oriented to person place and time   EKG:  EKG is not ordered today. The ekg ordered 01/14/17 demonstrates sinus rhythm. Rate 70 bpm. 09/09/2017: Sinus rhythm.  Rate 63 bpm.  Incomplete right bundle branch block.  ETT 01/24/17:   Blood pressure demonstrated a normal response to exercise.  There was no ST segment deviation noted during stress.   1. Good exercise tolerance.  2. No evidence for ischemia by ST segment analysis.   PFTs 03/12/17: Normal.  Recent Labs: 08/15/2019: ALT 19 08/25/2019: TSH 2.029 12/05/2019: BUN 9; Creatinine, Ser 0.81; Hemoglobin 10.4; Platelets 318; Potassium 3.7; Sodium 136    Lipid Panel    Component Value Date/Time   CHOL 185 06/17/2019 1227   TRIG 70 06/17/2019 1227   HDL 64 06/17/2019 1227   CHOLHDL 2.9 06/17/2019 1227   CHOLHDL 3.2 12/06/2015 1153   VLDL 13 12/06/2015 1153   LDLCALC 108 (H) 06/17/2019 1227      Wt Readings from Last 3 Encounters:  12/08/19 248 lb 9.6 oz (112.8 kg)  12/05/19 (!) 264 lb 8.8 oz (120 kg)  11/22/19 244 lb (110.7 kg)     ASSESSMENT AND PLAN:  # Chest pain:  Ms. Walkup's symptoms are not due to ischemia.  Her symptoms have resolved.  She had a negative stress test and a coronary calcium score of 0.  If she has chest pain in the future consider noncardiac causes.    # Palpitations: # Sinus tachycardia:  Ms. Leoni has episodes of tachycardia that were seen to be sinus tachycardia and occasional PVCs on her ambulatory monitor.  It is unclear why her heart rate is going so fast at rest.  Her thyroid function has been normal.  She was only mildly  anemic.  We did try metoprolol but she did not tolerate it.  It seems as if she may have sleep apnea which may be contributing.  We will get a sleep study  to better evaluate.  If this is negative consider diltiazem.  She is not excited about trying any medication as she reports many medicines do not agree with her.  # Apnea: # Daytime somnolence: Ms. Quizon has symptoms concerning for sleep apnea.  Her son was recently diagnosed and given her morbid obesity I suspect she may have it as well.  We will get a sleep study as above.   Current medicines are reviewed at length with the patient today.  The patient does not have concerns regarding medicines.  The following changes have been made:  Prn metoprolol.   Labs/ tests ordered today include:   Orders Placed This Encounter  Procedures  . Split night study     Disposition:   FU with Lakesha Levinson C. Duke Salvia, MD, [facility] in 3 months.      Signed, Chasta Deshpande C. Duke Salvia, MD, [facility]  12/08/2019 5:36 PM    [facility]

## 2019-12-08 NOTE — Patient Instructions (Signed)
Medication Instructions:  Your physician recommends that you continue on your current medications as directed. Please refer to the Current Medication list given to you today.  *If you need a refill on your cardiac medications before your next appointment, please call your pharmacy  Lab Work: NONE If you have labs (blood work) drawn today and your tests are completely normal, you will receive your results only by:  MyChart Message (if you have MyChart) OR  A paper copy in the mail If you have any lab test that is abnormal or we need to change your treatment, we will call you to review the results.  Testing/Procedures: Your physician has recommended that you have a sleep study. This test records several body functions during sleep, including: brain activity, eye movement, oxygen and carbon dioxide blood levels, heart rate and rhythm, breathing rate and rhythm, the flow of air through your mouth and nose, snoring, body muscle movements, and chest and belly movement. THE OFFICE WILL CALL YOU TO SCHEDULE ONCE YOUR INSURANCE HAS APPROVED. IF YOU DO NOT HEAR IN 2 WEEKS CALL [phone]  Follow-Up: At [facility], you and your health needs are our priority.  As part of our continuing mission to provide you with exceptional heart care, we have created designated Provider Care Teams.  These Care Teams include your primary Cardiologist (physician) and Advanced Practice Providers (APPs -  Physician Assistants and Nurse Practitioners) who all work together to provide you with the care you need, when you need it.  We recommend signing up for the patient portal called "MyChart".  Sign up information is provided on this After Visit Summary.  MyChart is used to connect with patients for Virtual Visits (Telemedicine).  Patients are able to view lab/test results, encounter notes, upcoming appointments, etc.  Non-urgent messages can be sent to your provider as well.   To learn more about what you can do with  MyChart, go to [email or web address].    Your next appointment:   DR [facility] 03-14-2020 AT 4:00 PM IN PERSON

## 2019-12-13 ENCOUNTER — Encounter: Payer: 59 | Admitting: Family Medicine

## 2019-12-19 ENCOUNTER — Other Ambulatory Visit: Payer: Self-pay

## 2019-12-19 ENCOUNTER — Ambulatory Visit
Admission: RE | Admit: 2019-12-19 | Discharge: 2019-12-19 | Disposition: A | Discharge status: Home / Self Care | Payer: 59 | Source: Ambulatory Visit | Attending: Orthopaedic Surgery | Admitting: Orthopaedic Surgery

## 2019-12-19 DIAGNOSIS — M5412 Radiculopathy, cervical region: Secondary | ICD-10-CM

## 2019-12-20 ENCOUNTER — Telehealth: Payer: Self-pay | Admitting: *Deleted

## 2019-12-20 NOTE — Telephone Encounter (Signed)
PA for in lab sleep study faxed to [facility].

## 2019-12-21 ENCOUNTER — Ambulatory Visit (INDEPENDENT_AMBULATORY_CARE_PROVIDER_SITE_OTHER): Payer: 59 | Admitting: Neurology

## 2019-12-21 ENCOUNTER — Other Ambulatory Visit: Payer: Self-pay

## 2019-12-21 DIAGNOSIS — R1310 Dysphagia, unspecified: Secondary | ICD-10-CM

## 2019-12-21 DIAGNOSIS — R531 Weakness: Secondary | ICD-10-CM

## 2019-12-21 DIAGNOSIS — M791 Myalgia, unspecified site: Secondary | ICD-10-CM

## 2019-12-21 NOTE — Procedures (Signed)
[facility]  [address], [address]  [place], [place] [address] Tel: [phone] Fax:  [phone] Test Date:  12/21/2019  Patient: [name] DOB: [birth year] Physician: Nita Sickle, DO  Sex: Female Height: 5\' 4"  Ref Phys: , D.O.  ID#: Shon Millet Temp: 33.0C Technician:    Patient Complaints: This is a 44 year old female referred for evaluation of generalized paresthesias and pain.  NCV & EMG Findings: Extensive electrodiagnostic testing of the right upper and lower extremity shows:  1. All sensory responses including the right median, ulnar, sural, and superficial peroneal nerves are within normal limits. 2. All motor responses including the right median, ulnar, peroneal, and tibial nerves are within normal limits. 3. Right tibial H reflex study is within normal limits. 4. There is no evidence of active or chronic motor axonal loss changes affecting any of the tested muscles.  Motor unit configuration and recruitment pattern is within normal limits.  Impression: This is a normal study of the right upper and lower extremities.  In particular, there is no evidence of a sensorimotor polyneuropathy, cervical/lumbosacral radiculopathy, carpal tunnel syndrome, or diffuse myopathy.   ___________________________ 59, DO    Nerve Conduction Studies Anti Sensory Summary Table   Stim Site NR Peak (ms) Norm Peak (ms) P-T Amp (V) Norm P-T Amp  Right Median Anti Sensory (2nd Digit)  33C  Wrist    3.0 <3.4 42.0 >20  Right Sup Peroneal Anti Sensory (Ant Lat Mall)  33C  12 cm    2.1 <4.5 11.9 >5  Right Sural Anti Sensory (Lat Mall)  33C  Calf    2.2 <4.5 19.2 >5  Right Ulnar Anti Sensory (5th Digit)  33C  Wrist    2.7 <3.1 48.3 >12   Motor Summary Table   Stim Site NR Onset (ms) Norm Onset (ms) O-P Amp (mV) Norm O-P Amp Site1 Site2 Delta-0 (ms) Dist (cm) Vel (m/s) Norm Vel (m/s)  Right Median Motor (Abd Poll Brev)  33C  Wrist    2.9 <3.9  13.9 >6 Elbow Wrist 4.4 29.0 66 >50  Elbow    7.3  13.7         Right Peroneal Motor (Ext Dig Brev)  33C  Ankle    3.1 <5.5 6.4 >3 B Fib Ankle 7.1 41.0 58 >40  B Fib    10.2  6.1  Poplt B Fib 1.5 10.0 67 >40  Poplt    11.7  5.7         Right Tibial Motor (Abd Hall Brev)  33C  Ankle    3.5 <6.0 10.8 >8 Knee Ankle 7.6 42.0 55 >40  Knee    11.1  5.8         Right Ulnar Motor (Abd Dig Minimi)  33C  Wrist    2.3 <3.1 9.3 >7 B Elbow Wrist 3.6 23.0 64 >50  B Elbow    5.9  9.2  A Elbow B Elbow 1.8 10.0 56 >50  A Elbow    7.7  9.0          Comparison Summary Table   Stim Site NR Peak (ms) Norm Peak (ms) P-T Amp (V) Site1 Site2 Delta-P (ms) Norm Delta (ms)  Right Median/Ulnar Palm Comparison (Wrist - 8cm)  33C  Median Palm    1.8 <2.2 53.7 Median Palm Ulnar Palm 0.3   Ulnar Palm    1.5 <2.2 13.6       H Reflex Studies   NR H-Lat (  ms) Lat Norm (ms) L-R H-Lat (ms)  Right Tibial (Gastroc)  33C     34.15 <35    EMG   Side Muscle Ins Act Fibs Psw Fasc Number Recrt Dur Dur. Amp Amp. Poly Poly. Comment  Right AntTibialis Nml Nml Nml Nml Nml Nml Nml Nml Nml Nml Nml Nml N/A  Right Gastroc Nml Nml Nml Nml Nml Nml Nml Nml Nml Nml Nml Nml N/A  Right Flex Dig Long Nml Nml Nml Nml Nml Nml Nml Nml Nml Nml Nml Nml N/A  Right RectFemoris Nml Nml Nml Nml Nml Nml Nml Nml Nml Nml Nml Nml N/A  Right BicepsFemS Nml Nml Nml Nml Nml Nml Nml Nml Nml Nml Nml Nml N/A  Right 1stDorInt Nml Nml Nml Nml Nml Nml Nml Nml Nml Nml Nml Nml N/A  Right PronatorTeres Nml Nml Nml Nml Nml Nml Nml Nml Nml Nml Nml Nml N/A  Right Biceps Nml Nml Nml Nml Nml Nml Nml Nml Nml Nml Nml Nml N/A  Right Triceps Nml Nml Nml Nml Nml Nml Nml Nml Nml Nml Nml Nml N/A  Right Deltoid Nml Nml Nml Nml Nml Nml Nml Nml Nml Nml Nml Nml N/A      Waveforms:

## 2019-12-22 ENCOUNTER — Telehealth: Payer: Self-pay

## 2019-12-22 NOTE — Telephone Encounter (Signed)
Pt advised. Pt states she was seen by her orthodontist the other day to see if she has TMJ. And the test came back normal. Pt doesn't understand why she is having the numbenss and tingling. And now she having the symptoms on her face. She was advised to see a Neurologist . Pt states she already has one.  Pt wanted to know if she could have a X-Ray something of her face or head? Please advise

## 2019-12-22 NOTE — Telephone Encounter (Signed)
-----   Message from Drema Dallas, DO sent at 12/22/2019 10:05 AM EDT ----- Nerve and muscle test is normal

## 2019-12-22 NOTE — Telephone Encounter (Signed)
Telephone call to pt,Advised pt of her EMG results. Pt wanted to know what is the step because she still having numbness and tingling?

## 2019-12-22 NOTE — Telephone Encounter (Signed)
I have no explanation for her symptoms.

## 2019-12-23 ENCOUNTER — Other Ambulatory Visit: Payer: Self-pay

## 2019-12-23 ENCOUNTER — Other Ambulatory Visit: Payer: Self-pay | Admitting: Cardiovascular Disease

## 2019-12-23 ENCOUNTER — Telehealth: Payer: Self-pay | Admitting: *Deleted

## 2019-12-23 DIAGNOSIS — R200 Anesthesia of skin: Secondary | ICD-10-CM

## 2019-12-23 DIAGNOSIS — R0681 Apnea, not elsewhere classified: Secondary | ICD-10-CM

## 2019-12-23 DIAGNOSIS — R400 Somnolence: Secondary | ICD-10-CM

## 2019-12-23 NOTE — Telephone Encounter (Signed)
Left sleep study appointment details on patient's cell VM.

## 2019-12-23 NOTE — Telephone Encounter (Signed)
Pt advised of MRI of Brain ordered.

## 2019-12-23 NOTE — Telephone Encounter (Signed)
MRI of brain with and without contrast for facial numbness

## 2019-12-28 ENCOUNTER — Other Ambulatory Visit: Payer: Self-pay | Admitting: Nurse Practitioner

## 2019-12-28 DIAGNOSIS — E559 Vitamin D deficiency, unspecified: Secondary | ICD-10-CM

## 2019-12-29 ENCOUNTER — Ambulatory Visit: Payer: Self-pay

## 2019-12-29 ENCOUNTER — Encounter: Payer: Self-pay | Admitting: Orthopaedic Surgery

## 2019-12-29 ENCOUNTER — Ambulatory Visit (INDEPENDENT_AMBULATORY_CARE_PROVIDER_SITE_OTHER): Payer: 59 | Admitting: Orthopaedic Surgery

## 2019-12-29 DIAGNOSIS — G8929 Other chronic pain: Secondary | ICD-10-CM

## 2019-12-29 DIAGNOSIS — M25511 Pain in right shoulder: Secondary | ICD-10-CM | POA: Diagnosis not present

## 2019-12-29 NOTE — Progress Notes (Signed)
Subjective: Patient is here for ultrasound-guided intra-articular right glenohumeral injection.  This is for diagnostic and hopefully therapeutic purposes.  Objective:  Pain with active ROM.  Procedure: Ultrasound-guided right glenohumeral injection: After sterile prep with Betadine, injected 8 cc 1% lidocaine without epinephrine and 40 mg methylprednisolone using a 22-gauge spinal needle, passing the needle from posterior approach into the glenohumeral joint.  Injectate seen filling joint capsule.  Some improvement during anesthetic phase.

## 2019-12-29 NOTE — Progress Notes (Signed)
Office Visit Note   Patient: [name]           Date of Birth: [birth year]           MRN: [id number] Visit Date: 12/29/2019              Requested by: [name], FNP [address] [place],  [place] [address] PCP: [name], FNP   Assessment & Plan: Visit Diagnoses:  1. Chronic right shoulder pain     Plan: Impression is chronic right shoulder pain.  At this point, we will refer the patient to Dr. Prince Rome for diagnostic and hopefully therapeutic cortisone injection to the glenohumeral joint.  She will follow up with [place] as needed.  Follow-Up Instructions: Return if symptoms worsen or fail to improve.   Orders:  No orders of the defined types were placed in this encounter.  No orders of the defined types were placed in this encounter.     Procedures: No procedures performed   Clinical Data: No additional findings.   Subjective: Chief Complaint  Patient presents with  . Neck - Pain    HPI patient is a pleasant 44 year old female who comes in today to discuss MRI results of the cervical spine.  She is approximately 2 years out from right shoulder arthroscopic debridement, decompression, distal clavicle excision and biceps tenotomy by Dr. Everardo Pacific.  She has had continued anterior shoulder pain with repetitive activities to include folding her laundry and making her bed.  We have tried subacromial cortisone injection, steroid packs, physical therapy all without relief of symptoms.  Of note, her subacromial cortisone injection did help but only lasted for a few days.  Subsequent MRI was ordered of the right shoulder which showed mild supraspinatus tendinopathy but nothing more.  MRI of the cervical spine was then ordered which was essentially normal with the exception of mild degenerative changes throughout.  No nerve compression.  Review of Systems as detailed in HPI.  All others reviewed and are negative.   Objective: Vital Signs: LMP 12/27/2003    Physical Exam well-developed well-nourished female no acute distress.  Alert and oriented x3.  Ortho Exam examination of her right shoulder reveals full active range of motion and strength in all planes.  Negative empty can and cross body adduction.  She is neurovascular intact distally.  Specialty Comments:  No specialty comments available.  Imaging: No new imaging   PMFS History: Patient Active Problem List   Diagnosis Date Noted  . Inappropriate sinus tachycardia 12/08/2019  . Morbid obesity (HCC) 12/08/2019  . Apnea 12/08/2019  . Anaphylactic reaction due to food, subsequent encounter 11/22/2019  . Allergic rhinoconjunctivitis 11/22/2019  . Idiopathic urticaria 11/22/2019  . Generalized anxiety disorder 04/08/2018  . Atypical chest pain   . Regurgitation of food   . Laryngopharyngeal reflux (LPR) 07/30/2017  . Throat soreness 07/30/2017  . ANA positive 12/30/2016  . Gastroesophageal reflux disease 06/27/2016  . Disturbance of skin sensation 04/27/2014  . Dizziness and giddiness 03/14/2014  . Headache, migraine 03/14/2014  . Myalgia and myositis, unspecified 06/07/2013  . Nausea and vomiting in adult 08/22/2012  . Viral syndrome 08/22/2012  . Pelvic pain in female 12/18/2010  . PID (acute pelvic inflammatory disease) 12/18/2010  . ANEMIA-IRON DEFICIENCY 10/15/2006  . HERPES, GENITAL NOS 09/30/2006  . DISORDER, NONORGANIC SLEEP NOS 09/30/2006  . CONSTIPATION NOS 09/30/2006  . NEURITIS, LUMBOSACRAL NOS 09/30/2006  . HX, PERSONAL, MENTAL DISORDER NOS 09/30/2006   Past Medical History:  Diagnosis Date  . Allergy   . Anemia   . Angio-edema   . Anxiety   . Apnea 12/08/2019  . Arthritis   . Constipation   . GERD (gastroesophageal reflux disease)   . Hx of endometriosis   . Inappropriate sinus tachycardia 12/08/2019  . Insomnia   . Macromastia 07/2011  . Migraine   . Morbid obesity (HCC) 12/08/2019  . Urticaria     Family History  Problem Relation Age of Onset  .  Diabetes Mother   . Hypertension Mother   . Thyroid disease Sister   . Heart attack Maternal Grandmother   . Stroke Maternal Grandfather   . Asthma Son   . Asthma Daughter   . Colon cancer Neg Hx   . Colon polyps Neg Hx   . Esophageal cancer Neg Hx   . Rectal cancer Neg Hx   . Stomach cancer Neg Hx     Past Surgical History:  Procedure Laterality Date  . 24 HOUR PH STUDY N/A 01/12/2018   Procedure: 24 HOUR PH STUDY;  Surgeon: Napoleon Form, MD;  Location: [facility];  Service: Endoscopy;  Laterality: N/A;  . ABDOMINAL HYSTERECTOMY  12/12/2003  . ADENOIDECTOMY  05/2011  . ANKLE ARTHROSCOPY  12/29/2007   right; with extensive debridement  . BLADDER NECK RECONSTRUCTION  01/14/2011   Procedure: BLADDER NECK REPAIR;  Surgeon: Reva Bores, MD;  Location: [facility];  Service: Gynecology;  Laterality: N/A;  Laparoscopic Repair of Incidental Cystotomy  . BREAST REDUCTION SURGERY  08/05/2011   Procedure: MAMMARY REDUCTION  (BREAST);  Surgeon: Louisa Second, MD;  Location: [facility];  Service: Plastics;  Laterality: Bilateral;  bilateral  . CESAREAN SECTION  12/29/2000; 1994  . DILATION AND CURETTAGE OF UTERUS  07/08/2003   open laparoscopy  . ESOPHAGEAL MANOMETRY N/A 01/12/2018   Procedure: ESOPHAGEAL MANOMETRY (EM);  Surgeon: Napoleon Form, MD;  Location: [facility];  Service: Endoscopy;  Laterality: N/A;  . GIVENS CAPSULE STUDY N/A 06/23/2017   Procedure: GIVENS CAPSULE STUDY;  Surgeon: Benancio Deeds, MD;  Location: [facility];  Service: Gastroenterology;  Laterality: N/A;  . LAPAROSCOPIC SALPINGOOPHERECTOMY  01/14/2011   left  . PH IMPEDANCE STUDY N/A 01/12/2018   Procedure: PH IMPEDANCE STUDY;  Surgeon: Napoleon Form, MD;  Location: [facility];  Service: Endoscopy;  Laterality: N/A;  . REPAIR PERONEAL TENDONS ANKLE  01/03/2010   repair right subluxing peroneal tendons  . RESECTION DISTAL CLAVICAL Right 09/25/2017   Procedure: RESECTION DISTAL CLAVICAL;   Surgeon: Bjorn Pippin, MD;  Location: [facility];  Service: Orthopedics;  Laterality: Right;  . RIGHT OOPHORECTOMY     with lysis of adhesions  . SHOULDER ACROMIOPLASTY Right 09/25/2017   Procedure: SHOULDER ACROMIOPLASTY;  Surgeon: Bjorn Pippin, MD;  Location: [facility];  Service: Orthopedics;  Laterality: Right;  . SHOULDER ARTHROSCOPY WITH DEBRIDEMENT AND BICEP TENDON REPAIR Right 09/25/2017   Procedure: SHOULDER ARTHROSCOPY WITH DEBRIDEMENT AND BICEP TENDON REPAIR;  Surgeon: Bjorn Pippin, MD;  Location: [facility];  Service: Orthopedics;  Laterality: Right;  . SHOULDER ARTHROSCOPY WITH ROTATOR CUFF REPAIR Right 09/25/2017   Procedure: SHOULDER ARTHROSCOPY WITH ROTATOR CUFF REPAIR;  Surgeon: Bjorn Pippin, MD;  Location: [facility];  Service: Orthopedics;  Laterality: Right;  . TONSILLECTOMY  as a child  . TUBAL LIGATION  12/29/2000   Social History   Occupational History    Employer: OTHER    Comment:  [employer]  Tobacco Use  . Smoking status: Never Smoker  . Smokeless tobacco: Never Used  Vaping Use  . Vaping Use: Never used  Substance and Sexual Activity  . Alcohol use: Yes    Comment: occasional   . Drug use: No  . Sexual activity: Not on file

## 2019-12-30 ENCOUNTER — Encounter: Payer: Self-pay | Admitting: Gastroenterology

## 2019-12-30 ENCOUNTER — Telehealth: Payer: Self-pay

## 2019-12-30 ENCOUNTER — Ambulatory Visit (INDEPENDENT_AMBULATORY_CARE_PROVIDER_SITE_OTHER): Payer: 59 | Admitting: Gastroenterology

## 2019-12-30 VITALS — BP 118/78 | HR 78 | Ht 64.0 in | Wt 246.0 lb

## 2019-12-30 DIAGNOSIS — R1310 Dysphagia, unspecified: Secondary | ICD-10-CM | POA: Diagnosis not present

## 2019-12-30 DIAGNOSIS — D509 Iron deficiency anemia, unspecified: Secondary | ICD-10-CM | POA: Diagnosis not present

## 2019-12-30 DIAGNOSIS — Z01818 Encounter for other preprocedural examination: Secondary | ICD-10-CM

## 2019-12-30 DIAGNOSIS — K219 Gastro-esophageal reflux disease without esophagitis: Secondary | ICD-10-CM

## 2019-12-30 NOTE — Telephone Encounter (Signed)
Referred pt to [facility], Dr. Andrey Campanile for Bariatric surgery consult.

## 2019-12-30 NOTE — Progress Notes (Signed)
HPI :  44 year old female here for a follow-up visit. She has a history of GERD, regurgitation, iron deficiency anemia.  She has had chronic reflux, dysphagia, throat irritation over the years and has had an extensive evaluation for this.  Please see work-up as below.  She has been evaluated at [facility] by Dr. Alycia Rossetti in 2015, and again with me in 2019 with extensive evaluation.  She continues to have regurgitation that bothers her, states this is a frequent issue.  Her current regimen is Nexium 40 mg twice daily.  She states this is worked better for her in the past than other regimens.  She has been on Carafate in the past previously but did not like taking that.  She has had EGD, gastric emptying study, esophageal manometry, pH study, barium swallow.  In summary, it has been thought that her symptoms are due to hypersensitive esophagus in the setting of regurgitation.  She had a similar work-up done in [facility] in 2015 with similar findings.  She states she has been tried on gabapentin, Lyrica, nortriptyline, other TCAs in the past without benefit.  Her BMI is greater than 40, she has not been a candidate for traditional Nissen or TIF in the past.  She was seen by cardiology for palpitations, underwent cardiac CT which had a calcium score of 0, had Holter monitor showing sinus tachycardia with rare PVCs.  She is being sent for a sleep apnea test to rule out OSA as cause of her tachycardia and daytime somnolence.  She has been having some dysphagia that has bothered her more recently.  States she will have this with every meal, more bulky foods bother her more than soft things.  She thinks this is been going on intermittently for years.  She has not been taking her iron pills as she feels they get stuck and she does not tolerate them.  We performed a barium swallow for her most recently on April 29 and which showed mild dysmotility, although otherwise no obvious stricture or stenosis.  In looking  through her chart she has never had a dilation empirically for this issue.  She inquires about that today.  While she has struggled to lose weight, she states she seen a nutritionist in the past and has not been helpful.  She asked about long-term options to manage this.  Otherwise no blood in the stools.  She states she does not have menstrual cycles regularly.  He has not been taking her iron due to intolerance.  She had a mildly low iron level on July 8 of 38, iron sat of 9%.  Most recent hemoglobin of 10.4 with a MCV of 78.  She had a work-up with EGD, colonoscopy, capsule endoscopy for this issue in the past without a clear cause.  Prior workup: Capsule endoscopy - 06/23/17 - complete study with good prep, no clear cause for iron deficiency EGD 02/20/2017 - 2cm HH, otherwise normal exam, biopsies negative for HP Colonoscopy 02/20/2017 - focal mild erythema in the rectum - biopsied showed no evidence of colitis, otherwise normal colon and ileum  MRI liver / abdomen - 02/10/2017 - focal fatty infiltration, normal biliary tree, otherwise no pathology noted in the abdomen  Historic evaluation: CT scan neck 12/02/16 - normal CT angio of chest 12/02/2016 - normal EGD 08/19/2013 - Dr. Alycia Rossetti - normal esophagus, mild gastritis Esophageal manometry 2015 - normal PH study 2015 - normal exam, Demeester score of 5.7, positive symptom index for regurgitation and  reflux EGG normal 2015 Normal hydrogen breath test 2015 Normal gastric emptying scan 2015 GES - delayed in 2011 - Reglan caused twitching and it was stopped.  GES 11/13/2017 - normal  Manometry / 24 hr pH impedance testing was recommended  Esophageal manometry 01/12/18 - normal PH impedance study 01/12/18 - Demeester score 9, SAP positively correlates with reflux, good acid suppression on PPI  Barium swallow 09/02/19 - IMPRESSION: Mild intermittent esophageal dysmotility.  Small sliding hiatal hernia.  Otherwise unremarkable esophagram  as described.   Past Medical History:  Diagnosis Date  . Allergy   . Anemia   . Angio-edema   . Anxiety   . Apnea 12/08/2019  . Arthritis   . Constipation   . GERD (gastroesophageal reflux disease)   . Hx of endometriosis   . Inappropriate sinus tachycardia 12/08/2019  . Insomnia   . Macromastia 07/2011  . Migraine   . Morbid obesity (HCC) 12/08/2019  . Urticaria      Past Surgical History:  Procedure Laterality Date  . 24 HOUR PH STUDY N/A 01/12/2018   Procedure: 24 HOUR PH STUDY;  Surgeon: Napoleon Form, MD;  Location: [facility];  Service: Endoscopy;  Laterality: N/A;  . ABDOMINAL HYSTERECTOMY  12/12/2003  . ADENOIDECTOMY  05/2011  . ANKLE ARTHROSCOPY  12/29/2007   right; with extensive debridement  . BLADDER NECK RECONSTRUCTION  01/14/2011   Procedure: BLADDER NECK REPAIR;  Surgeon: Reva Bores, MD;  Location: [facility];  Service: Gynecology;  Laterality: N/A;  Laparoscopic Repair of Incidental Cystotomy  . BREAST REDUCTION SURGERY  08/05/2011   Procedure: MAMMARY REDUCTION  (BREAST);  Surgeon: Louisa Second, MD;  Location: [facility];  Service: Plastics;  Laterality: Bilateral;  bilateral  . CESAREAN SECTION  12/29/2000; 1994  . DILATION AND CURETTAGE OF UTERUS  07/08/2003   open laparoscopy  . ESOPHAGEAL MANOMETRY N/A 01/12/2018   Procedure: ESOPHAGEAL MANOMETRY (EM);  Surgeon: Napoleon Form, MD;  Location: [facility];  Service: Endoscopy;  Laterality: N/A;  . GIVENS CAPSULE STUDY N/A 06/23/2017   Procedure: GIVENS CAPSULE STUDY;  Surgeon: Benancio Deeds, MD;  Location: [facility];  Service: Gastroenterology;  Laterality: N/A;  . LAPAROSCOPIC SALPINGOOPHERECTOMY  01/14/2011   left  . PH IMPEDANCE STUDY N/A 01/12/2018   Procedure: PH IMPEDANCE STUDY;  Surgeon: Napoleon Form, MD;  Location: [facility];  Service: Endoscopy;  Laterality: N/A;  . REPAIR PERONEAL TENDONS ANKLE  01/03/2010   repair right subluxing peroneal tendons  . RESECTION  DISTAL CLAVICAL Right 09/25/2017   Procedure: RESECTION DISTAL CLAVICAL;  Surgeon: Bjorn Pippin, MD;  Location: [facility];  Service: Orthopedics;  Laterality: Right;  . RIGHT OOPHORECTOMY     with lysis of adhesions  . SHOULDER ACROMIOPLASTY Right 09/25/2017   Procedure: SHOULDER ACROMIOPLASTY;  Surgeon: Bjorn Pippin, MD;  Location: [facility];  Service: Orthopedics;  Laterality: Right;  . SHOULDER ARTHROSCOPY WITH DEBRIDEMENT AND BICEP TENDON REPAIR Right 09/25/2017   Procedure: SHOULDER ARTHROSCOPY WITH DEBRIDEMENT AND BICEP TENDON REPAIR;  Surgeon: Bjorn Pippin, MD;  Location: [facility];  Service: Orthopedics;  Laterality: Right;  . SHOULDER ARTHROSCOPY WITH ROTATOR CUFF REPAIR Right 09/25/2017   Procedure: SHOULDER ARTHROSCOPY WITH ROTATOR CUFF REPAIR;  Surgeon: Bjorn Pippin, MD;  Location: [facility];  Service: Orthopedics;  Laterality: Right;  . TONSILLECTOMY  as a child  . TUBAL LIGATION  12/29/2000   Family History  Problem Relation Age  of Onset  . Diabetes Mother   . Hypertension Mother   . Thyroid disease Sister   . Heart attack Maternal Grandmother   . Stroke Maternal Grandfather   . Asthma Son   . Asthma Daughter   . Colon cancer Neg Hx   . Colon polyps Neg Hx   . Esophageal cancer Neg Hx   . Rectal cancer Neg Hx   . Stomach cancer Neg Hx    Social History   Tobacco Use  . Smoking status: Never Smoker  . Smokeless tobacco: Never Used  Vaping Use  . Vaping Use: Never used  Substance Use Topics  . Alcohol use: Yes    Comment: occasional   . Drug use: No   Current Outpatient Medications  Medication Sig Dispense Refill  . albuterol (VENTOLIN HFA) 108 (90 Base) MCG/ACT inhaler Inhale 2 puffs into the lungs every 6 (six) hours as needed for wheezing or shortness of breath.    . cetirizine (ZYRTEC) 10 MG tablet Take 1 tablet (10 mg total) by mouth daily as needed for rhinitis. 30 tablet 5  . esomeprazole  (NEXIUM) 40 MG capsule Take 1 capsule (40 mg total) by mouth daily at 12 noon. 30 capsule 6  . fluticasone furoate-vilanterol (BREO ELLIPTA) 100-25 MCG/INH AEPB Inhale 1 puff into the lungs daily. 1 each 11  . RESTASIS 0.05 % ophthalmic emulsion 1 drop 2 (two) times daily.    . Vitamin D, Ergocalciferol, (DRISDOL) 1.25 MG (50000 UNIT) CAPS capsule TAKE 1 CAPSULE (50,000 UNITS TOTAL) BY MOUTH 2 (TWO) TIMES A WEEK. 24 capsule 1  . zolpidem (AMBIEN CR) 12.5 MG CR tablet Take 12.5 mg by mouth at bedtime as needed.     No current facility-administered medications for this visit.   Allergies  Allergen Reactions  . Carmie Kanner [Fish Allergy]      Review of Systems: All systems reviewed and negative except where noted in HPI.     Lab Results  Component Value Date   WBC 4.7 12/05/2019   HGB 10.4 (L) 12/05/2019   HCT 33.3 (L) 12/05/2019   MCV 78.7 (L) 12/05/2019   PLT 318 12/05/2019    Lab Results  Component Value Date   CREATININE 0.81 12/05/2019   BUN 9 12/05/2019   NA 136 12/05/2019   K 3.7 12/05/2019   CL 102 12/05/2019   CO2 26 12/05/2019    Lab Results  Component Value Date   ALT 19 08/15/2019   AST 22 08/15/2019   ALKPHOS 68 08/15/2019   BILITOT 0.4 08/15/2019       Physical Exam: BP 118/78   Pulse 78   Ht 5\' 4"  (1.626 m)   Wt 246 lb (111.6 kg)   LMP 12/27/2003   BMI 42.23 kg/m  Constitutional: Pleasant,well-developed, female in no acute distress. Neurological: Alert and oriented to person place and time. Skin: Skin is warm and dry. No rashes noted. Psychiatric: Normal mood and affect. Behavior is normal.   ASSESSMENT AND PLAN: 44 year old female here for reassessment of the following:  GERD with regurgitation and hypersensitive esophagus - difficult situation, will continue PPI as I think she will do worse without PPI.  She has had an extensive evaluation at 2 centers at this point who have come to the same conclusion that she has GERD with hypersensitive  esophagus.  Unfortunately she has not responded to TCAs, gabapentin or Lyrica etc regarding the hypersensitivity.  I do think her weight is contributing to the situation.  Is also  possible she has underlying sleep apnea which could be making this worse as well, and that evaluation is pending.  She is not a candidate for TIF or traditional Nissen fundoplication given her weight.  If she cannot lose weight to be candidate for these and then she could consider gastric bypass or bariatric surgery.  She is to the point where she wishes to consider that.  I will send her to the [facility] to see if they can put together a plan that will help her lose weight.  Otherwise we will refer her to Dr. Andrey CampanileWilson of [facility] bariatric surgery to discuss options with her.  We will continue PPI for now.  Other option to consider would be baclofen in the situation for regurgitation, however given her daytime somnolence I do not think she will tolerate this and her symptoms are worse during the day than at night.  We will hold off on baclofen after discussion of options with her.  We will hold off on further TCA, she does not want to try that again.  Dysphagia - no overt or high-grade stricture noted on her work-up in the past, on EoE, unclear if mild dysmotility on barium study is causing this.  She has a real problem with pills.  She has not had an empiric dilation, I offered this to her to see if it will help at all with her swallowing.  I discussed risk and benefits of EGD and dilation with her and she wants to proceed.  Clear from cardiac perspective, further recommendations pending results of this.  Iron deficiency - mild iron deficiency, extensive GI work-up in the past without revealing cause.  She is not taking her iron as she has difficulty tolerating it as above, does not want to take any more orally, asking for IV iron infusion.  I will refer her for IV iron infusion.  If she has OSA I think this is driving her  fatigue, however we will see how she responds to IV iron  Morbid obesity - BMI 42,  as above, referring to weight loss clinic and bariatric surgery.  Ileene PatrickSteven Makaylia Hewett, MD [facility]

## 2019-12-30 NOTE — Patient Instructions (Addendum)
If you are age 44 or older, your body mass index should be between 23-30. Your Body mass index is 42.23 kg/m. If this is out of the aforementioned range listed, please consider follow up with your Primary Care Provider.  If you are age 58 or younger, your body mass index should be between 19-25. Your Body mass index is 42.23 kg/m. If this is out of the aformentioned range listed, please consider follow up with your Primary Care Provider.   You have been scheduled for an endoscopy. Please follow written instructions given to you at your visit today. If you use inhalers (even only as needed), please bring them with you on the day of your procedure.  We will refer you to the [facility].  They will contact you to schedule an appointment.  We will refer you to [facility] to discuss weight loss surgery options.  They will call you to schedule an appointment.   Your provider has recommended IV iron.  We will refer you to Hematology. The [facility] is not open yet.  Hematology will contact you to schedule an appointment.  Thank you for entrusting me with your care and for choosing [facility], Dr. Ileene Patrick

## 2019-12-31 ENCOUNTER — Other Ambulatory Visit: Payer: Self-pay

## 2019-12-31 ENCOUNTER — Ambulatory Visit (INDEPENDENT_AMBULATORY_CARE_PROVIDER_SITE_OTHER): Payer: 59 | Admitting: Family Medicine

## 2019-12-31 ENCOUNTER — Encounter: Payer: Self-pay | Admitting: Gastroenterology

## 2019-12-31 ENCOUNTER — Encounter: Payer: Self-pay | Admitting: Family Medicine

## 2019-12-31 VITALS — BP 130/86 | HR 86 | Temp 98.2°F | Resp 17

## 2019-12-31 DIAGNOSIS — T7800XD Anaphylactic reaction due to unspecified food, subsequent encounter: Secondary | ICD-10-CM | POA: Diagnosis not present

## 2019-12-31 DIAGNOSIS — J4540 Moderate persistent asthma, uncomplicated: Secondary | ICD-10-CM | POA: Diagnosis not present

## 2019-12-31 DIAGNOSIS — J309 Allergic rhinitis, unspecified: Secondary | ICD-10-CM | POA: Diagnosis not present

## 2019-12-31 DIAGNOSIS — L501 Idiopathic urticaria: Secondary | ICD-10-CM

## 2019-12-31 DIAGNOSIS — H1010 Acute atopic conjunctivitis, unspecified eye: Secondary | ICD-10-CM

## 2019-12-31 MED ORDER — EPINEPHRINE 0.3 MG/0.3ML IJ SOAJ
0.3000 mg | INTRAMUSCULAR | 2 refills | Status: DC | PRN
Start: 2019-12-31 — End: 2020-07-07

## 2019-12-31 NOTE — Patient Instructions (Addendum)
Allergic rhinitis Continue allergen avoidance measures directed toward tree pollen, dust mite, and cockroach as listed below, continue an antihistamine once a day as needed for runny nose or itch Continue Flonase 1 to 2 sprays in each nostril once a day as needed for stuffy nose.  In the right nostril, point the applicator out toward the right ear. In the left nostril, point the applicator out toward the left ear Consider saline nasal rinses as needed for nasal symptoms. Use this before any medicated nasal sprays for best result  Allergic conjunctivitis Some over the counter eye drops include Pataday one drop in each eye once a day as needed for red, itchy eyes OR Zaditor one drop in each eye twice a day as needed for red itchy eyes.  Hives (urticaria)  . Cetirizine (Zyrtec) 10mg  twice a day and famotidine (Pepcid) 20 mg twice a day. If no symptoms for 7-14 days then decrease to. . Cetirizine (Zyrtec) 10mg  twice a day and famotidine (Pepcid) 20 mg once a day.  If no symptoms for 7-14 days then decrease to. . Cetirizine (Zyrtec) 10mg  twice a day.  If no symptoms for 7-14 days then decrease to. . Cetirizine (Zyrtec) 10mg  once a day.  May use Benadryl (diphenhydramine) as needed for breakthrough hives       If symptoms return, then step up dosage  Keep a detailed symptom journal including foods eaten, contact with allergens, medications taken, weather changes.   Allergic reaction In case of an allergic reaction, take Benadryl 50 mg every 4 hours, and if life-threatening symptoms occur, inject with EpiPen 0.3 mg. Emergency action plan given  Asthma Continue your current treatment plan as outlined by [facility] and keep follow up appointments as directed Continue Breo Ellipta 1 puff once a day to prevent cough or wheeze Continue albuterol 2 puffs once every 4 hours as needed for cough or wheeze  Call the clinic if this treatment plan is not working well for you  Follow up in 6  months or sooner if needed.  Reducing Pollen Exposure The American Academy of Allergy, Asthma and Immunology suggests the following steps to reduce your exposure to pollen during allergy seasons. 1. Do not hang sheets or clothing out to dry; pollen may collect on these items. 2. Do not mow lawns or spend time around freshly cut grass; mowing stirs up pollen. 3. Keep windows closed at night.  Keep car windows closed while driving. 4. Minimize morning activities outdoors, a time when pollen counts are usually at their highest. 5. Stay indoors as much as possible when pollen counts or humidity is high and on windy days when pollen tends to remain in the air longer. 6. Use air conditioning when possible.  Many air conditioners have filters that trap the pollen spores. 7. Use a HEPA room air filter to remove pollen form the indoor air you breathe.   Control of Dust Mite Allergen Dust mites play a major role in allergic asthma and rhinitis. They occur in environments with high humidity wherever human skin is found. Dust mites absorb humidity from the atmosphere (ie, they do not drink) and feed on organic matter (including shed human and animal skin). Dust mites are a microscopic type of insect that you cannot see with the naked eye. High levels of dust mites have been detected from mattresses, pillows, carpets, upholstered furniture, bed covers, clothes, soft toys and any woven material. The principal allergen of the dust mite is found in its feces.  A gram of dust may contain 1,000 mites and 250,000 fecal particles. Mite antigen is easily measured in the air during house cleaning activities. Dust mites do not bite and do not cause harm to humans, other than by triggering allergies/asthma.  Ways to decrease your exposure to dust mites in your home:  1. Encase mattresses, box springs and pillows with a mite-impermeable barrier or cover  2. Wash sheets, blankets and drapes weekly in hot water (130 F)  with detergent and dry them in a dryer on the hot setting.  3. Have the room cleaned frequently with a vacuum cleaner and a damp dust-mop. For carpeting or rugs, vacuuming with a vacuum cleaner equipped with a high-efficiency particulate air (HEPA) filter. The dust mite allergic individual should not be in a room which is being cleaned and should wait 1 hour after cleaning before going into the room.  4. Do not sleep on upholstered furniture (eg, couches).  5. If possible removing carpeting, upholstered furniture and drapery from the home is ideal. Horizontal blinds should be eliminated in the rooms where the person spends the most time (bedroom, study, television room). Washable vinyl, roller-type shades are optimal.  6. Remove all non-washable stuffed toys from the bedroom. Wash stuffed toys weekly like sheets and blankets above.  7. Reduce indoor humidity to less than 50%. Inexpensive humidity monitors can be purchased at most hardware stores. Do not use a humidifier as can make the problem worse and are not recommended.  Control of Cockroach Allergen  Cockroach allergen has been identified as an important cause of acute attacks of asthma, especially in urban settings.  There are fifty-five species of cockroach that exist in the [place], however only three, the [place], [place] species produce allergen that can affect patients with Asthma.  Allergens can be obtained from fecal particles, egg casings and secretions from cockroaches.    1. Remove food sources. 2. Reduce access to water. 3. Seal access and entry points. 4. Spray runways with 0.5-1% Diazinon or Chlorpyrifos 5. Blow boric acid power under stoves and refrigerator. 6. Place bait stations (hydramethylnon) at feeding sites.

## 2019-12-31 NOTE — Progress Notes (Signed)
[address] [name] [place] [address] Dept: [phone]  FOLLOW UP NOTE  Patient ID: [name], female    DOB: May 16, 1975  Age: 44 y.o. MRN: [id number] Date of Office Visit: 12/31/2019  Assessment  Chief Complaint: Food Intolerance  HPI [name] is a 44 year old female who presents to the clinic for a follow up visit. She was last seen in this clinic on 11/22/2018 by Thermon Leyland, FNP, for evaluation of allergic rhinitis, allergic conjunctivitis, hives, reflux, and food allergy to salmon.  At today's visit she reports allergic rhinitis has been moderately well controlled with occasional clear rhinorrhea, nasal congestion at night, and some postnasal drainage.  She continues Flonase as needed and takes either Claritin or cetirizine as needed.  She reports that cetirizine makes her sleepy so on days when she works she takes Claritin.  Allergic conjunctivitis is reported as moderately well controlled with this occasional itching for which she uses allergy eyedrop with relief of symptoms.  She is not currently avoiding any foods and reports that she eats salmon on a regular basis with no adverse reaction.  She does report that she is breaking out in hives once every other week with the hives occurring on her arms, and face.  She does report that she occasionally feels throat closure and abdominal pains with these hives.  When she gets hives, she reports that they last for a couple hours and resolves when she takes a cold shower.  She does have asthma for which she follows pulmonology specialty.  At today's visit, she denies shortness of breath, cough, or wheeze.  She reports she continues [employer] as needed and uses albuterol about once every other week.  Her current medications are listed in the chart.   Drug Allergies:  Allergies  Allergen Reactions  . Salmon [Fish Allergy]     Physical Exam: BP 130/86 (BP Location: Right Arm)   Pulse 86   Temp 98.2 F (36.8 C)  (Temporal)   Resp 17   LMP 12/27/2003   SpO2 99%    Physical Exam Vitals reviewed.  Constitutional:      Appearance: Normal appearance.  HENT:     Head: Normocephalic and atraumatic.     Right Ear: Tympanic membrane normal.     Left Ear: Tympanic membrane normal.     Nose:     Comments: Bilateral nares edematous and pale with clear nasal drainage noted.  Pharynx normal.  Ears normal.  Eyes normal.    Mouth/Throat:     Pharynx: Oropharynx is clear.  Eyes:     Conjunctiva/sclera: Conjunctivae normal.  Cardiovascular:     Rate and Rhythm: Normal rate and regular rhythm.     Heart sounds: Normal heart sounds. No murmur heard.   Pulmonary:     Effort: Pulmonary effort is normal.     Breath sounds: Normal breath sounds.     Comments: Lungs clear to auscultation Musculoskeletal:        General: Normal range of motion.     Cervical back: Normal range of motion and neck supple.  Skin:    General: Skin is warm and dry.  Neurological:     Mental Status: She is alert and oriented to person, place, and time.  Psychiatric:        Mood and Affect: Mood normal.        Behavior: Behavior normal.        Thought Content: Thought content normal.  Judgment: Judgment normal.     Diagnostics: FVC 2.72, FEV1 2.42.  Predicted FVC 3.03, predicted FEV1 2.47.  Spirometry indicates normal ventilatory function.  Assessment and Plan: 1. Anaphylactic reaction due to food, subsequent encounter   2. Allergic rhinoconjunctivitis   3. Idiopathic urticaria   4. Moderate persistent asthma without complication     Meds ordered this encounter  Medications  . EPINEPHrine (EPIPEN 2-PAK) 0.3 mg/0.3 mL IJ SOAJ injection    Sig: Inject 0.3 mLs (0.3 mg total) into the muscle as needed for anaphylaxis.    Dispense:  2 each    Refill:  2    Patient Instructions  Allergic rhinitis Continue allergen avoidance measures directed toward tree pollen, dust mite, and cockroach as listed below, continue  an antihistamine once a day as needed for runny nose or itch Continue Flonase 1 to 2 sprays in each nostril once a day as needed for stuffy nose.  In the right nostril, point the applicator out toward the right ear. In the left nostril, point the applicator out toward the left ear Consider saline nasal rinses as needed for nasal symptoms. Use this before any medicated nasal sprays for best result  Allergic conjunctivitis Some over the counter eye drops include Pataday one drop in each eye once a day as needed for red, itchy eyes OR Zaditor one drop in each eye twice a day as needed for red itchy eyes.  Hives (urticaria)  . Cetirizine (Zyrtec) 10mg  twice a day and famotidine (Pepcid) 20 mg twice a day. If no symptoms for 7-14 days then decrease to. . Cetirizine (Zyrtec) 10mg  twice a day and famotidine (Pepcid) 20 mg once a day.  If no symptoms for 7-14 days then decrease to. . Cetirizine (Zyrtec) 10mg  twice a day.  If no symptoms for 7-14 days then decrease to. . Cetirizine (Zyrtec) 10mg  once a day.  May use Benadryl (diphenhydramine) as needed for breakthrough hives       If symptoms return, then step up dosage  Keep a detailed symptom journal including foods eaten, contact with allergens, medications taken, weather changes.   Allergic reaction In case of an allergic reaction, take Benadryl 50 mg every 4 hours, and if life-threatening symptoms occur, inject with EpiPen 0.3 mg. Emergency action plan given  Asthma Continue your current treatment plan as outlined by [facility] and keep follow up appointments as directed Continue Breo Ellipta 1 puff once a day to prevent cough or wheeze Continue albuterol 2 puffs once every 4 hours as needed for cough or wheeze  Call the clinic if this treatment plan is not working well for you  Follow up in 6 months or sooner if needed   Return in about 6 months (around 07/02/2020), or if symptoms worsen or fail to improve.    Thank you for  the opportunity to care for this patient.  Please do not hesitate to contact me with questions.  , FNP [facility]

## 2020-01-03 ENCOUNTER — Ambulatory Visit (INDEPENDENT_AMBULATORY_CARE_PROVIDER_SITE_OTHER): Payer: 59

## 2020-01-03 DIAGNOSIS — Z1159 Encounter for screening for other viral diseases: Secondary | ICD-10-CM

## 2020-01-03 LAB — SARS CORONAVIRUS 2 (TAT 6-24 HRS): SARS Coronavirus 2: NEGATIVE

## 2020-01-04 ENCOUNTER — Ambulatory Visit (INDEPENDENT_AMBULATORY_CARE_PROVIDER_SITE_OTHER): Payer: 59 | Admitting: Family Medicine

## 2020-01-04 ENCOUNTER — Encounter (INDEPENDENT_AMBULATORY_CARE_PROVIDER_SITE_OTHER): Payer: Self-pay | Admitting: Family Medicine

## 2020-01-04 ENCOUNTER — Ambulatory Visit
Admission: RE | Admit: 2020-01-04 | Discharge: 2020-01-04 | Disposition: A | Discharge status: Home / Self Care | Payer: 59 | Source: Ambulatory Visit | Attending: Neurology | Admitting: Neurology

## 2020-01-04 ENCOUNTER — Other Ambulatory Visit: Payer: Self-pay

## 2020-01-04 VITALS — BP 117/80 | HR 73 | Temp 97.8°F | Ht 64.0 in | Wt 239.0 lb

## 2020-01-04 DIAGNOSIS — K760 Fatty (change of) liver, not elsewhere classified: Secondary | ICD-10-CM | POA: Insufficient documentation

## 2020-01-04 DIAGNOSIS — E559 Vitamin D deficiency, unspecified: Secondary | ICD-10-CM | POA: Diagnosis not present

## 2020-01-04 DIAGNOSIS — R0602 Shortness of breath: Secondary | ICD-10-CM | POA: Diagnosis not present

## 2020-01-04 DIAGNOSIS — Z9189 Other specified personal risk factors, not elsewhere classified: Secondary | ICD-10-CM | POA: Diagnosis not present

## 2020-01-04 DIAGNOSIS — D649 Anemia, unspecified: Secondary | ICD-10-CM

## 2020-01-04 DIAGNOSIS — Z0289 Encounter for other administrative examinations: Secondary | ICD-10-CM

## 2020-01-04 DIAGNOSIS — E538 Deficiency of other specified B group vitamins: Secondary | ICD-10-CM | POA: Diagnosis not present

## 2020-01-04 DIAGNOSIS — R5383 Other fatigue: Secondary | ICD-10-CM

## 2020-01-04 DIAGNOSIS — Z1331 Encounter for screening for depression: Secondary | ICD-10-CM

## 2020-01-04 DIAGNOSIS — Z6839 Body mass index (BMI) 39.0-39.9, adult: Secondary | ICD-10-CM

## 2020-01-04 DIAGNOSIS — R200 Anesthesia of skin: Secondary | ICD-10-CM

## 2020-01-04 DIAGNOSIS — K219 Gastro-esophageal reflux disease without esophagitis: Secondary | ICD-10-CM

## 2020-01-04 MED ORDER — GADOBENATE DIMEGLUMINE 529 MG/ML IV SOLN
20.0000 mL | Freq: Once | INTRAVENOUS | Status: AC | PRN
  Administered 2020-01-04: 20 mL via INTRAVENOUS

## 2020-01-04 NOTE — Progress Notes (Signed)
Dear Dr. Adela Lank,   Thank you for referring [name] to our clinic. The following note includes my evaluation and treatment recommendations.  Chief Complaint:   OBESITY MEL TADROS (MR# [id number]) is a 44 y.o. female who presents for evaluation and treatment of obesity and related comorbidities. Current BMI is Body mass index is 41.02 kg/m. Danaisha has been struggling with her weight for many years and has been unsuccessful in either losing weight, maintaining weight loss, or reaching her healthy weight goal.  Peniel is currently in the action stage of change and ready to dedicate time achieving and maintaining a healthier weight. Kynnedy is interested in becoming our patient and working on intensive lifestyle modifications including (but not limited to) diet and exercise for weight loss.  Elodie was referred to [place] by Dr. Adela Lank of GI, and they also sent her for bariatric surgery consult with Dr. Andrey Campanile of [facility].  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  Mita's habits were reviewed today and are as follows: her desired weight loss is 67 pounds, she has been heavy most of her life, she started gaining weight a year ago, her heaviest weight ever was 290 pounds, she is a picky eater and doesn't like to eat healthier foods, she craves rice, she snacks frequently in the evenings, she skips breakfast or lunch frequently, she is frequently drinking liquids with calories, she frequently makes poor food choices, she has problems with excessive hunger, she frequently eats larger portions than normal and she struggles with emotional eating.  Depression Screen Renna's Food and Mood (modified PHQ-9) score was 11.  Depression screen [facility] 2/9 01/04/2020  Decreased Interest 0  Down, Depressed, Hopeless 0  PHQ - 2 Score 0  Altered sleeping 0  Tired, decreased energy 0  Change in appetite 1  Feeling bad or failure about yourself  0  Trouble  concentrating 0  Moving slowly or fidgety/restless 0  Suicidal thoughts -  PHQ-9 Score 1  Difficult doing work/chores -  Some recent data might be hidden   Subjective:   1. Other fatigue Kaelene denies daytime somnolence and admits to waking up still tired. Patent has a history of symptoms of morning fatigue. Cathlene generally gets 6 or 7 hours of sleep per night, and states that she has generally restful sleep. Snoring is not present. Apneic episodes are not present. Epworth Sleepiness Score is 1.  2. Shortness of breath on exertion Dovey notes increasing shortness of breath with exercising and seems to be worsening over time with weight gain. She notes getting out of breath sooner with activity than she used to. This has gotten worse recently. Tanique denies shortness of breath at rest or orthopnea.  3. NAFLD (nonalcoholic fatty liver disease) Tykia sees and is followed by GI.  They encouraged weight loss to improve GI conditions.  4. Anemia, unspecified type Jamecia is not a vegetarian.  She does not have a history of weight loss surgery.  Per GI, no known cause.  Inadequate intake and she fails to tolerate her OTC supplement.  She returned recently for iron infusions, per GI, since noncompliance with medical treatment.  CBC Latest Ref Rng & Units 01/04/2020 12/05/2019 08/25/2019  WBC 3.4 - 10.8 x10E3/uL 4.6 4.7 6.8  Hemoglobin 11.1 - 15.9 g/dL 11.0(L) 10.4(L) 11.2(L)  Hematocrit 34.0 - 46.6 % 35.8 33.3(L) 35.5(L)  Platelets 150 - 450 x10E3/uL 345 318 375   Lab Results  Component Value Date   IRON 38 (L) 11/11/2019   TIBC 315 06/17/2019   TIBC 346 06/17/2019   FERRITIN 34.7 11/11/2019   Lab Results  Component Value Date   VITAMINB12 1,679 (H) 01/04/2020   5. Vitamin D deficiency Jorene's Vitamin D level was 10.5 on 06/17/2019. She is currently taking prescription vitamin D 50,000 IU twice weekly. She denies nausea, vomiting or muscle weakness.  6. Gastroesophageal reflux disease,  unspecified whether esophagitis present She is on a daily PPI.  She sees GI.  She says her symptoms are stable.  She has seen several GI doctors due to hypersensitivity of esophagus in the setting of "regurg".  Per GI, she has failed several medications to help with symptoms.  7. h/o B12 deficiency due to diet She is not a vegetarian.  She does not have a previous diagnosis of pernicious anemia.  She does not have a history of weight loss surgery.   Lab Results  Component Value Date   VITAMINB12 1,679 (H) 01/04/2020   8. Depression screening Amarianna was screened for depression as part of her new patient workup. PHQ-9 is 11.  9. At risk for malnutrition Ivori is at increased risk for malnutrition due to history of iron deficiency and B12 deficiency as well as vitamin D deficiency.  Assessment/Plan:   1. Other fatigue Hannie does feel that her weight is causing her energy to be lower than it should be. Fatigue may be related to obesity, depression or many other causes. Labs will be ordered, and in the meanwhile, Darenda will focus on self care including making healthy food choices, increasing physical activity and focusing on stress reduction.  Recently had ECG done (in chart).  - Vitamin B12 - Hemoglobin A1c - Insulin, random - Lipid Panel With LDL/HDL Ratio - T3 - T4, free - TSH - Folate  2. Shortness of breath on exertion Anthonette does feel that she gets out of breath more easily that she used to when she exercises. Chad's shortness of breath appears to be obesity related and exercise induced. She has agreed to work on weight loss and gradually increase exercise to treat her exercise induced shortness of breath. Will continue to monitor closely.  - Lipid Panel With LDL/HDL Ratio  3. NAFLD (nonalcoholic fatty liver disease) Check labs, prudent nutritional plan, weight loss.  Will continue to monitor as she loses weight.  4. Anemia, unspecified type Check labs.  Follow-up with  specialists for infusions as scheduled in the near future.  Orders and follow up as documented in patient record.  Counseling . Iron is essential for our bodies to make red blood cells.  Reasons that someone may be deficient include: an iron-deficient diet (more likely in those following vegan or vegetarian diets), women with heavy menses, patients with GI disorders or poor absorption, patients that have had bariatric surgery, frequent blood donors, patients with cancer, and patients with heart disease.   Gaspar Cola foods include dark leafy greens, red and white meats, eggs, seafood, and beans.   . Certain foods and drinks prevent your body from absorbing iron properly. Avoid eating these foods in the same meal as iron-rich foods or with iron supplements. These foods include: coffee, black tea, and red wine; milk, dairy products, and foods that are high in calcium; beans and soybeans; whole grains.  . Constipation can be a side effect of iron supplementation. Increased water and fiber intake are helpful. Water goal: > 2 liters/day. Fiber goal: > 25 grams/day.  -  Vitamin B12 - CBC with Differential/Platelet - Folate  5. Vitamin D deficiency Low Vitamin D level contributes to fatigue and are associated with obesity, breast, and colon cancer. She agrees to continue to take prescription Vitamin D @50 ,000 IU twice weekly.  Check labs.  She is tolerating medications well.  Prudent nutritional plan, weight loss.  - VITAMIN D 25 Hydroxy (Vit-D Deficiency, Fractures)  6. Gastroesophageal reflux disease, unspecified whether esophagitis present Check labs, continue medications per GI and regular follow-up with them as scheduled.  Weight loss and prudent nutritional plan.  - CBC with Differential/Platelet - Comprehensive metabolic panel - T3 - T4, free - TSH  7. h/o B12 deficiency due to diet Check labs and prudent nutritional plan.  - Vitamin B12 - CBC with Differential/Platelet - Folate  8.  Depression screening Erion had a positive depression screening. Depression is commonly associated with obesity and often results in emotional eating behaviors. We will monitor this closely and work on CBT to help improve the non-hunger eating patterns. Referral to Psychology may be required if no improvement is seen as she continues in our clinic.  9. At risk for malnutrition Mikela was given approximately 15 minutes of counseling today regarding prevention of malnutrition and ways to meet macronutrient goals.  Pt has a h/o fe def, B12 def and vit D def already.  We discussed importance of our varied nutrition plan and eating everything on plan.  10. Class 2 severe obesity with serious comorbidity and body mass index (BMI) of 39.0 to 39.9 in adult, unspecified obesity type (HCC) Arali is currently in the action stage of change and her goal is to continue with weight loss efforts. I recommend Shaela begin the structured treatment plan as follows:  She has agreed to the Category 2 Plan.  Reviewed IC results and meal plan in detail with her today.  Obtain labs today and follow-up in 2 weeks to discuss all results in detail.  Exercise goals: As is.   Behavioral modification strategies: increasing lean protein intake, decreasing simple carbohydrates, decreasing liquid calories and no skipping meals.  She was informed of the importance of frequent follow-up visits to maximize her success with intensive lifestyle modifications for her multiple health conditions. She was informed we would discuss her lab results at her next visit unless there is a critical issue that needs to be addressed sooner. Paisley agreed to keep her next visit at the agreed upon time to discuss these results.  Objective:   Blood pressure 117/80, pulse 73, temperature 97.8 F (36.6 C), height 5\' 4"  (1.626 m), weight 239 lb (108.4 kg), last menstrual period 12/27/2003, SpO2 100 %. Body mass index is 41.02 kg/m.  Indirect  Calorimeter completed today shows a VO2 of 302 and a REE of 2104.  Her calculated basal metabolic rate is [id number] thus her basal metabolic rate is better than expected.  General: Cooperative, alert, well developed, in no acute distress. HEENT: Conjunctivae and lids unremarkable. Cardiovascular: Regular rhythm.  Lungs: Normal work of breathing. Neurologic: No focal deficits.   Lab Results  Component Value Date   CREATININE 0.79 01/04/2020   BUN 11 01/04/2020   NA 137 01/04/2020   K 4.2 01/04/2020   CL 99 01/04/2020   CO2 26 01/04/2020   Lab Results  Component Value Date   ALT 19 01/04/2020   AST 20 01/04/2020   ALKPHOS 74 01/04/2020   BILITOT 0.3 01/04/2020   Lab Results  Component Value Date   HGBA1C 5.7 (H)  01/04/2020   HGBA1C 5.7 (H) 06/17/2019   HGBA1C 5.1 12/24/2017   HGBA1C 5.1 06/16/2017   Lab Results  Component Value Date   TSH 0.872 01/04/2020   Lab Results  Component Value Date   CHOL 223 (H) 01/04/2020   HDL 62 01/04/2020   LDLCALC 147 (H) 01/04/2020   TRIG 80 01/04/2020   CHOLHDL 2.9 06/17/2019   Lab Results  Component Value Date   WBC 4.6 01/04/2020   HGB 11.0 (L) 01/04/2020   HCT 35.8 01/04/2020   MCV 77 (L) 01/04/2020   PLT 345 01/04/2020   Lab Results  Component Value Date   IRON 38 (L) 11/11/2019   TIBC 315 06/17/2019   TIBC 346 06/17/2019   FERRITIN 34.7 11/11/2019   Attestation Statements:   Reviewed by clinician on day of visit: allergies, medications, problem list, medical history, surgical history, family history, social history, and previous encounter notes.  I, Insurance claims handler, CMA, am acting as Energy manager for [employer], DO.  I have reviewed the above documentation for accuracy and completeness, and I agree with the above. Thomasene Lot, DO

## 2020-01-04 NOTE — Addendum Note (Signed)
Addended by: Osa Craver on: 01/04/2020 06:32 PM   Modules accepted: Orders

## 2020-01-05 ENCOUNTER — Encounter: Payer: Self-pay | Admitting: Gastroenterology

## 2020-01-05 ENCOUNTER — Ambulatory Visit (AMBULATORY_SURGERY_CENTER): Payer: 59 | Admitting: Gastroenterology

## 2020-01-05 ENCOUNTER — Other Ambulatory Visit: Payer: Self-pay

## 2020-01-05 VITALS — BP 112/65 | HR 75 | Temp 96.6°F | Resp 10 | Ht 64.0 in | Wt 246.0 lb

## 2020-01-05 DIAGNOSIS — K449 Diaphragmatic hernia without obstruction or gangrene: Secondary | ICD-10-CM | POA: Diagnosis not present

## 2020-01-05 DIAGNOSIS — K219 Gastro-esophageal reflux disease without esophagitis: Secondary | ICD-10-CM

## 2020-01-05 DIAGNOSIS — R1310 Dysphagia, unspecified: Secondary | ICD-10-CM | POA: Diagnosis not present

## 2020-01-05 LAB — CBC WITH DIFFERENTIAL/PLATELET
Basophils Absolute: 0 10*3/uL (ref 0.0–0.2)
Basos: 1 %
EOS (ABSOLUTE): 0.1 10*3/uL (ref 0.0–0.4)
Eos: 2 %
Hematocrit: 35.8 % (ref 34.0–46.6)
Hemoglobin: 11 g/dL — ABNORMAL LOW (ref 11.1–15.9)
Immature Grans (Abs): 0 10*3/uL (ref 0.0–0.1)
Immature Granulocytes: 0 %
Lymphocytes Absolute: 1.9 10*3/uL (ref 0.7–3.1)
Lymphs: 41 %
MCH: 23.7 pg — ABNORMAL LOW (ref 26.6–33.0)
MCHC: 30.7 g/dL — ABNORMAL LOW (ref 31.5–35.7)
MCV: 77 fL — ABNORMAL LOW (ref 79–97)
Monocytes Absolute: 0.4 10*3/uL (ref 0.1–0.9)
Monocytes: 8 %
Neutrophils Absolute: 2.3 10*3/uL (ref 1.4–7.0)
Neutrophils: 48 %
Platelets: 345 10*3/uL (ref 150–450)
RBC: 4.65 x10E6/uL (ref 3.77–5.28)
RDW: 13.9 % (ref 11.7–15.4)
WBC: 4.6 10*3/uL (ref 3.4–10.8)

## 2020-01-05 LAB — COMPREHENSIVE METABOLIC PANEL
ALT: 19 IU/L (ref 0–32)
AST: 20 IU/L (ref 0–40)
Albumin/Globulin Ratio: 1.6 (ref 1.2–2.2)
Albumin: 4.5 g/dL (ref 3.8–4.8)
Alkaline Phosphatase: 74 IU/L (ref 48–121)
BUN/Creatinine Ratio: 14 (ref 9–23)
BUN: 11 mg/dL (ref 6–24)
Bilirubin Total: 0.3 mg/dL (ref 0.0–1.2)
CO2: 26 mmol/L (ref 20–29)
Calcium: 9.3 mg/dL (ref 8.7–10.2)
Chloride: 99 mmol/L (ref 96–106)
Creatinine, Ser: 0.79 mg/dL (ref 0.57–1.00)
GFR calc Af Amer: 105 mL/min/{1.73_m2} (ref >59)
GFR calc non Af Amer: 91 mL/min/{1.73_m2} (ref >59)
Globulin, Total: 2.9 g/dL (ref 1.5–4.5)
Glucose: 94 mg/dL (ref 65–99)
Potassium: 4.2 mmol/L (ref 3.5–5.2)
Sodium: 137 mmol/L (ref 134–144)
Total Protein: 7.4 g/dL (ref 6.0–8.5)

## 2020-01-05 LAB — LIPID PANEL WITH LDL/HDL RATIO
Cholesterol, Total: 223 mg/dL — ABNORMAL HIGH (ref 100–199)
HDL: 62 mg/dL (ref >39)
LDL Chol Calc (NIH): 147 mg/dL — ABNORMAL HIGH (ref 0–99)
LDL/HDL Ratio: 2.4 ratio (ref 0.0–3.2)
Triglycerides: 80 mg/dL (ref 0–149)
VLDL Cholesterol Cal: 14 mg/dL (ref 5–40)

## 2020-01-05 LAB — FOLATE: Folate: 14.1 ng/mL (ref >3.0)

## 2020-01-05 LAB — VITAMIN B12: Vitamin B-12: 1679 pg/mL — ABNORMAL HIGH (ref 232–1245)

## 2020-01-05 LAB — INSULIN, RANDOM: INSULIN: 8 u[IU]/mL (ref 2.6–24.9)

## 2020-01-05 LAB — VITAMIN D 25 HYDROXY (VIT D DEFICIENCY, FRACTURES): Vit D, 25-Hydroxy: 75.2 ng/mL (ref 30.0–100.0)

## 2020-01-05 LAB — TSH: TSH: 0.872 u[IU]/mL (ref 0.450–4.500)

## 2020-01-05 LAB — HEMOGLOBIN A1C
Est. average glucose Bld gHb Est-mCnc: 117 mg/dL
Hgb A1c MFr Bld: 5.7 % — ABNORMAL HIGH (ref 4.8–5.6)

## 2020-01-05 LAB — T4, FREE: Free T4: 1.53 ng/dL (ref 0.82–1.77)

## 2020-01-05 LAB — T3: T3, Total: 96 ng/dL (ref 71–180)

## 2020-01-05 MED ORDER — SODIUM CHLORIDE 0.9 % IV SOLN: 500.0000 mL | Freq: Once | INTRAVENOUS | Status: DC

## 2020-01-05 NOTE — Progress Notes (Signed)
1155 Robinul 0.1 mg IV given due large amount of secretions upon assessment.  MD made aware, vss 

## 2020-01-05 NOTE — Progress Notes (Signed)
Called to room to assist during endoscopic procedure.  Patient ID and intended procedure confirmed with present staff. Received instructions for my participation in the procedure from the performing physician.  

## 2020-01-05 NOTE — Progress Notes (Signed)
Report given to PACU, vss 

## 2020-01-05 NOTE — Patient Instructions (Signed)
   FOLLOW DILATATION DIET TODAY- HANDOUT GIVEN TO YOU   YOU HAD AN ENDOSCOPIC PROCEDURE TODAY AT THE [facility]:   Refer to the procedure report that was given to you for any specific questions about what was found during the examination.  If the procedure report does not answer your questions, please call your gastroenterologist to clarify.  If you requested that your care partner not be given the details of your procedure findings, then the procedure report has been included in a sealed envelope for you to review at your convenience later.  YOU SHOULD EXPECT: Some feelings of bloating in the abdomen. Passage of more gas than usual.  Walking can help get rid of the air that was put into your GI tract during the procedure and reduce the bloating. If you had a lower endoscopy (such as a colonoscopy or flexible sigmoidoscopy) you may notice spotting of blood in your stool or on the toilet paper. If you underwent a bowel prep for your procedure, you may not have a normal bowel movement for a few days.  Please Note:  You might notice some irritation and congestion in your nose or some drainage.  This is from the oxygen used during your procedure.  There is no need for concern and it should clear up in a day or so.  SYMPTOMS TO REPORT IMMEDIATELY:     Following upper endoscopy (EGD)  Vomiting of blood or coffee ground material  New chest pain or pain under the shoulder blades  Painful or persistently difficult swallowing  New shortness of breath  Fever of 100F or higher  Black, tarry-looking stools  For urgent or emergent issues, a gastroenterologist can be reached at any hour by calling [phone]. Do not use MyChart messaging for urgent concerns.    DIET:  We do recommend a small meal at first, but then you may proceed to your regular diet.  Drink plenty of fluids but you should avoid alcoholic beverages for 24 hours.  ACTIVITY:  You should plan to take it easy for the  rest of today and you should NOT DRIVE or use heavy machinery until tomorrow (because of the sedation medicines used during the test).    FOLLOW UP: Our staff will call the number listed on your records 48-72 hours following your procedure to check on you and address any questions or concerns that you may have regarding the information given to you following your procedure. If we do not reach you, we will leave a message.  We will attempt to reach you two times.  During this call, we will ask if you have developed any symptoms of COVID 19. If you develop any symptoms (ie: fever, flu-like symptoms, shortness of breath, cough etc.) before then, please call [phone].  If you test positive for Covid 19 in the 2 weeks post procedure, please call and report this information to [place].    If any biopsies were taken you will be contacted by phone or by letter within the next 1-3 weeks.  Please call us at [phone] if you have not heard about the biopsies in 3 weeks.    SIGNATURES/CONFIDENTIALITY: You and/or your care partner have signed paperwork which will be entered into your electronic medical record.  These signatures attest to the fact that that the information above on your After Visit Summary has been reviewed and is understood.  Full responsibility of the confidentiality of this discharge information lies with you and/or your care-partner.

## 2020-01-05 NOTE — Op Note (Signed)
[facility] Patient Name: [name] Procedure Date: 01/05/2020 11:58 AM MRN: [id number] Endoscopist: Viviann Spare P. Adela Lank , MD Age: 44 Referring MD:  Date of Birth: [birth year] Gender: Female Account #: [id number] Procedure:                Upper GI endoscopy Indications:              Dysphagia, history of persistent GERD despite PPI                            nexium 40mg  BID, extensive prior evaluation. This                            exam is to perform empiric dilation given                            persistent dysphagia, no prior dilation attempts. Medicines:                Monitored Anesthesia Care Procedure:                Pre-Anesthesia Assessment:                           - Prior to the procedure, a History and Physical                            was performed, and patient medications and                            allergies were reviewed. The patient's tolerance of                            previous anesthesia was also reviewed. The risks                            and benefits of the procedure and the sedation                            options and risks were discussed with the patient.                            All questions were answered, and informed consent                            was obtained. Prior Anticoagulants: The patient has                            taken no previous anticoagulant or antiplatelet                            agents. ASA Grade Assessment: III - A patient with                            severe systemic disease. After reviewing the risks  and benefits, the patient was deemed in                            satisfactory condition to undergo the procedure.                           After obtaining informed consent, the endoscope was                            passed under direct vision. Throughout the                            procedure, the patient's blood pressure, pulse, and                             oxygen saturations were monitored continuously. The                            Endoscope was introduced through the mouth, and                            advanced to the second part of duodenum. The upper                            GI endoscopy was accomplished without difficulty.                            The patient tolerated the procedure well. Scope In: Scope Out: Findings:                 Esophagogastric landmarks were identified: the                            Z-line was found at 34 cm, the gastroesophageal                            junction was found at 34 cm and the upper extent of                            the gastric folds was found at 37 cm from the                            incisors.                           A 3 cm hiatal hernia was present.                           The exam of the esophagus was otherwise normal. Z                            line slightly irregular but does not meet criteria  for Barrett's biopsies. No stenosis / stricture /                            inflammation.                           A guidewire was placed and the scope was withdrawn.                            Empiric dilation was performed in the entire                            esophagus with a Savary dilator with mild                            resistance at 17 mm and 18 mm. Relook endoscopy                            showed no mucosal wrents.                           The entire examined stomach was normal.                           The duodenal bulb and second portion of the                            duodenum were normal. Complications:            No immediate complications. Estimated blood loss:                            Minimal. Estimated Blood Loss:     Estimated blood loss was minimal. Impression:               - Esophagogastric landmarks identified.                           - 3 cm hiatal hernia.                           - Normal esophagus otherwise,  empiric dilation                            performed.                           - Normal stomach.                           - Normal duodenal bulb and second portion of the                            duodenum. Recommendation:           - Patient has a contact number available for  emergencies. The signs and symptoms of potential                            delayed complications were discussed with the                            patient. Return to normal activities tomorrow.                            Written discharge instructions were provided to the                            patient.                           - Resume previous diet.                           - Continue present medications and await course                            post dilation Alfard Cochrane P. Doretta Remmert, MD 01/05/2020 12:17:42 PM This report has been signed electronically.

## 2020-01-05 NOTE — Progress Notes (Signed)
[facility] 

## 2020-01-07 ENCOUNTER — Telehealth: Payer: Self-pay

## 2020-01-07 ENCOUNTER — Telehealth: Payer: Self-pay | Admitting: *Deleted

## 2020-01-07 NOTE — Telephone Encounter (Signed)
  Follow up Call-  Call back number 01/05/2020  Post procedure Call Back phone  # [phone]  Permission to leave phone message Yes  Some recent data might be hidden     Patient questions:  Do you have a fever, pain , or abdominal swelling? No. Pain Score  0 *  Have you tolerated food without any problems? yes  Have you been able to return to your normal activities? yes  Do you have any questions about your discharge instructions: Diet   No. Medications  No. Follow up visit  No.  Do you have questions or concerns about your Care? No.  Actions: * If pain score is 4 or above: No action needed, pain <4.  1. Have you developed a fever since your procedure? no  2.   Have you had an respiratory symptoms (SOB or cough) since your procedure? no  3.   Have you tested positive for COVID 19 since your procedure no  4.   Have you had any family members/close contacts diagnosed with the COVID 19 since your procedure?  no   If yes to any of these questions please route to Laverna Peace, RN and Karlton Lemon, RN

## 2020-01-07 NOTE — Telephone Encounter (Signed)
Follow up call made, left message. 

## 2020-01-11 ENCOUNTER — Telehealth: Payer: Self-pay

## 2020-01-11 NOTE — Telephone Encounter (Signed)
-----   Message from Cooper Render, CMA sent at 12/30/2019  5:15 PM EDT ----- Regarding: referral to [facility] Referred pt to [facility], Dr. Andrey Campanile for Bariatric surgery consult on 8-26  Appt rec'd??

## 2020-01-11 NOTE — Telephone Encounter (Signed)
[facility] called back.  Bariatric referrals are handled by [place] and she is out today.  Will call back tomorrow with update on pt's appt status

## 2020-01-11 NOTE — Telephone Encounter (Signed)
Called [facility].  LM for referral Coordinator, Camile to call back re: if an appt has been scheduled for pt

## 2020-01-12 NOTE — Telephone Encounter (Signed)
Pt was LM a message on 8-31 about the seminar. She will be mailed paperwork that must be completed prior to consult being scheduled.

## 2020-01-15 ENCOUNTER — Other Ambulatory Visit: Payer: 59

## 2020-01-18 ENCOUNTER — Other Ambulatory Visit: Payer: Self-pay

## 2020-01-18 ENCOUNTER — Ambulatory Visit (INDEPENDENT_AMBULATORY_CARE_PROVIDER_SITE_OTHER): Payer: 59 | Admitting: Family Medicine

## 2020-01-18 ENCOUNTER — Encounter (INDEPENDENT_AMBULATORY_CARE_PROVIDER_SITE_OTHER): Payer: Self-pay | Admitting: Family Medicine

## 2020-01-18 VITALS — BP 123/78 | HR 66 | Temp 98.4°F | Ht 64.0 in | Wt 236.0 lb

## 2020-01-18 DIAGNOSIS — D509 Iron deficiency anemia, unspecified: Secondary | ICD-10-CM | POA: Diagnosis not present

## 2020-01-18 DIAGNOSIS — R7303 Prediabetes: Secondary | ICD-10-CM | POA: Diagnosis not present

## 2020-01-18 DIAGNOSIS — E559 Vitamin D deficiency, unspecified: Secondary | ICD-10-CM | POA: Diagnosis not present

## 2020-01-18 DIAGNOSIS — Z9189 Other specified personal risk factors, not elsewhere classified: Secondary | ICD-10-CM

## 2020-01-18 DIAGNOSIS — E785 Hyperlipidemia, unspecified: Secondary | ICD-10-CM | POA: Diagnosis not present

## 2020-01-18 DIAGNOSIS — Z6841 Body Mass Index (BMI) 40.0 and over, adult: Secondary | ICD-10-CM

## 2020-01-18 MED ORDER — VITAMIN D (ERGOCALCIFEROL) 1.25 MG (50000 UNIT) PO CAPS: ORAL_CAPSULE | ORAL | 0 refills | Status: DC

## 2020-01-18 NOTE — Patient Instructions (Signed)
The 10-year ASCVD risk score Denman George DC Montez Hageman., et al., 2013) is: 0.7%   Values used to calculate the score:     Age: 44 years     Sex: Female     Is Non-Hispanic African American: Yes     Diabetic: No     Tobacco smoker: No     Systolic Blood Pressure: 123 mmHg     Is BP treated: No     HDL Cholesterol: 62 mg/dL     Total Cholesterol: 223 mg/dL

## 2020-01-20 ENCOUNTER — Other Ambulatory Visit: Payer: Self-pay

## 2020-01-20 ENCOUNTER — Ambulatory Visit (HOSPITAL_COMMUNITY): Admission: EM | Admit: 2020-01-20 | Discharge: 2020-01-20 | Discharge status: Against Medical Advice | Payer: 59

## 2020-01-20 NOTE — Progress Notes (Signed)
Chief Complaint:   OBESITY [name] is here to discuss her progress with her obesity treatment plan along with follow-up of her obesity related diagnoses. [name] is on the Category 2 Plan and states she is following her eating plan approximately 100% of the time. [name] states she is exercising for 0 minutes 0 times per week.  Today's visit was #: 2 Starting weight: 239 lbs Starting date: 01/04/2020 Today's weight: 236 lbs Today's date: 01/18/2020 Total lbs lost to date: 3 lbs Total lbs lost since last in-office visit: 3 lbs  Interim History: [name] is here for her first follow-up visit.  She tolerated the meal plan but found it to be boring and redundant.  Prior to this, she ate only once per day, so at least she is eating more now.  She felt cravings for sweets for a few days, but did not use snack calories for these cravings and ate yogurt instead.  She does not like to cook for herself and desires to eat out most days.  She feels unmotivated to engage in self-care activities.   Subjective:   1. Iron deficiency anemia, unspecified iron deficiency anemia type [name] is not a vegetarian.  She does not have a history of weight loss surgery.  She states she used to be on iron infusions.  Her PCP or GI doctor has discussed this with her in the past.  She has low HgB and MCV, etc.  CBC Latest Ref Rng & Units 01/04/2020 12/05/2019 08/25/2019  WBC 3.4 - 10.8 x10E3/uL 4.6 4.7 6.8  Hemoglobin 11.1 - 15.9 g/dL 11.0(L) 10.4(L) 11.2(L)  Hematocrit 34.0 - 46.6 % 35.8 33.3(L) 35.5(L)  Platelets 150 - 450 x10E3/uL 345 318 375   Lab Results  Component Value Date   IRON 38 (L) 11/11/2019   TIBC 315 06/17/2019   TIBC 346 06/17/2019   FERRITIN 34.7 11/11/2019   Lab Results  Component Value Date   VITAMINB12 1,679 (H) 01/04/2020   2. Prediabetes [name] has a diagnosis of prediabetes based on her elevated HgA1c and was informed this puts her at greater risk of developing diabetes. She continues to  work on diet and exercise to decrease her risk of diabetes. She denies nausea or hypoglycemia.  No change in A1c with recent labs.  Lab Results  Component Value Date   HGBA1C 5.7 (H) 01/04/2020   Lab Results  Component Value Date   INSULIN 8.0 01/04/2020   3. Other hyperlipidemia [name] has hyperlipidemia and has been trying to improve her cholesterol levels with intensive lifestyle modification including a low saturated fat diet, exercise and weight loss. She denies any chest pain, claudication or myalgias.  She was told in the past she may need medication, but never went on it.  ASCVD risk is less than 1%.  Lab Results  Component Value Date   ALT 19 01/04/2020   AST 20 01/04/2020   ALKPHOS 74 01/04/2020   BILITOT 0.3 01/04/2020   Lab Results  Component Value Date   CHOL 223 (H) 01/04/2020   HDL 62 01/04/2020   LDLCALC 147 (H) 01/04/2020   TRIG 80 01/04/2020   CHOLHDL 2.9 06/17/2019   4. Vitamin D deficiency, unspecified [name]'s Vitamin D level was 75.2 on 01/04/2020. She is currently taking twice weekly vitamin D religiously. She denies nausea, vomiting or muscle weakness.  She endorses fatigue.    5. At risk for deficient intake of food The patient is at a higher than average risk of deficient  intake of food due.  Assessment/Plan:   1. Iron deficiency anemia, unspecified iron deficiency anemia type Orders and follow up as documented in patient record.  She is to follow-up regarding restarting her iron infusions.  She has follow-up with her PCP next week and she tells me she will discuss her treatment plan then.  Counseling . Iron is essential for our bodies to make red blood cells.  Reasons that someone may be deficient include: an iron-deficient diet (more likely in those following vegan or vegetarian diets), women with heavy menses, patients with GI disorders or poor absorption, patients that have had bariatric surgery, frequent blood donors, patients with cancer, and  patients with heart disease.   Gaspar Cola foods include dark leafy greens, red and white meats, eggs, seafood, and beans.   . Certain foods and drinks prevent your body from absorbing iron properly. Avoid eating these foods in the same meal as iron-rich foods or with iron supplements. These foods include: coffee, black tea, and red wine; milk, dairy products, and foods that are high in calcium; beans and soybeans; whole grains.  . Constipation can be a side effect of iron supplementation. Increased water and fiber intake are helpful. Water goal: > 2 liters/day. Fiber goal: > 25 grams/day.  2. Prediabetes [name] will continue to work on weight loss, exercise, and decreasing simple carbohydrates to help decrease the risk of diabetes.  Weight loss, prudent nutritional plan, recheck labs in 3 months.  3. Other hyperlipidemia Cardiovascular risk and specific lipid/LDL goals reviewed.  We discussed several lifestyle modifications today and [name] will continue to work on diet, exercise and weight loss efforts. Orders and follow up as documented in patient record.  Need for lifestyle change, weight loss, prudent nutritional plan, recheck in 4-6 months.  The 10-year ASCVD risk score [name] DC Jr., et al., 2013) is: 0.5%   Counseling Intensive lifestyle modifications are the first line treatment for this issue. . Dietary changes: Increase soluble fiber. Decrease simple carbohydrates. . Exercise changes: Moderate to vigorous-intensity aerobic activity 150 minutes per week if tolerated. . Lipid-lowering medications: see documented in medical record.  4. Vitamin D deficiency, unspecified Low Vitamin D level contributes to fatigue and are associated with obesity, breast, and colon cancer. She agrees to continue to take prescription Vitamin D @50 ,000 IU twice weekly and will follow-up for routine testing of Vitamin D, at least 2-3 times per year to avoid over-replacement.  Vitamin D is ag goal and continue  dose as we go into winter.  Recheck level in 2-3 months.  -Refill Vitamin D, Ergocalciferol, (DRISDOL) 1.25 MG (50000 UNIT) CAPS capsule; 1 tab q wed and 1 po qd sun  Dispense: 8 capsule; Refill: 0  5. At risk for heart disease Due to [name]'s current state of health and medical condition(s), she is at a higher risk for heart disease.  Pt counseled on these diseases and the importance of good control of them.  This puts the patient at much greater risk to subsequently develop cardiopulmonary conditions that can significantly affect patient's quality of life in a negative manner as well.   I stressed the importance of reversing risks factors of obesity, esp truncal and visceral fat, hypertension, hyperlipidemia, pre-diabetes etc.   Initial goal is to lose at least 5-10% of starting weight to help reduce these risk factors.   Counseling: Intensive lifestyle modifications discussed with [name] as most appropriate first line treatment.  she will continue to work on diet, exercise and weight loss  efforts.  We will continue to reassess these conditions on a fairly regular basis in an attempt to decrease patient's overall morbidity and mortality.  Evidence-based interventions for health behavior change were utilized today including the discussion of self monitoring techniques, problem-solving barriers and SMART goal setting techniques.  Specifically regarding patient's less desirable eating habits and patterns, we employed the technique of small changes when [name] has not been able to fully commit to her prudent nutritional plan.   6. Class 3 severe obesity with serious comorbidity and body mass index (BMI) of 40.0 to 44.9 in adult, unspecified obesity type (HCC) [name] is currently in the action stage of change. As such, her goal is to continue with weight loss efforts. She has agreed to the Category 2 Plan, but gave recipes and had a long discussion with her regarding meal planning ideas.  Exercise goals:  As is.  Behavioral modification strategies: increasing lean protein intake, meal planning and cooking strategies, keeping healthy foods in the home and better snacking choices (use of snack calories for sweets - only 200 calories).  Long discussion with her regarding intrinsic motivations to get healthy.  [name] has agreed to follow-up with our clinic in 2 weeks. She was informed of the importance of frequent follow-up visits to maximize her success with intensive lifestyle modifications for her multiple health conditions.   Objective:   Blood pressure 123/78, pulse 66, temperature 98.4 F (36.9 C), height 5\' 4"  (1.626 m), weight 236 lb (107 kg), last menstrual period 12/27/2003, SpO2 100 %. Body mass index is 40.51 kg/m.  General: Cooperative, alert, well developed, in no acute distress. HEENT: Conjunctivae and lids unremarkable. Cardiovascular: Regular rhythm.  Lungs: Normal work of breathing. Neurologic: No focal deficits.   Lab Results  Component Value Date   CREATININE 0.79 01/04/2020   BUN 11 01/04/2020   NA 137 01/04/2020   K 4.2 01/04/2020   CL 99 01/04/2020   CO2 26 01/04/2020   Lab Results  Component Value Date   ALT 19 01/04/2020   AST 20 01/04/2020   ALKPHOS 74 01/04/2020   BILITOT 0.3 01/04/2020   Lab Results  Component Value Date   HGBA1C 5.7 (H) 01/04/2020   HGBA1C 5.7 (H) 06/17/2019   HGBA1C 5.1 12/24/2017   HGBA1C 5.1 06/16/2017   Lab Results  Component Value Date   INSULIN 8.0 01/04/2020   Lab Results  Component Value Date   TSH 0.872 01/04/2020   Lab Results  Component Value Date   CHOL 223 (H) 01/04/2020   HDL 62 01/04/2020   LDLCALC 147 (H) 01/04/2020   TRIG 80 01/04/2020   CHOLHDL 2.9 06/17/2019   Lab Results  Component Value Date   WBC 4.6 01/04/2020   HGB 11.0 (L) 01/04/2020   HCT 35.8 01/04/2020   MCV 77 (L) 01/04/2020   PLT 345 01/04/2020   Lab Results  Component Value Date   IRON 38 (L) 11/11/2019   TIBC 315 06/17/2019     TIBC 346 06/17/2019   FERRITIN 34.7 11/11/2019   Attestation Statements:   Reviewed by clinician on day of visit: allergies, medications, problem list, medical history, surgical history, family history, social history, and previous encounter notes.  I, 01/12/2020, CMA, am acting as Insurance claims handler for Energy manager, DO.  I have reviewed the above documentation for accuracy and completeness, and I agree with the above. [employer], DO

## 2020-01-21 ENCOUNTER — Other Ambulatory Visit: Payer: Self-pay

## 2020-01-21 ENCOUNTER — Ambulatory Visit (HOSPITAL_COMMUNITY)
Admission: EM | Admit: 2020-01-21 | Discharge: 2020-01-21 | Disposition: A | Discharge status: Home / Self Care | Payer: 59 | Attending: Emergency Medicine | Admitting: Emergency Medicine

## 2020-01-21 ENCOUNTER — Encounter (HOSPITAL_COMMUNITY): Payer: Self-pay

## 2020-01-21 DIAGNOSIS — R300 Dysuria: Secondary | ICD-10-CM | POA: Insufficient documentation

## 2020-01-21 DIAGNOSIS — Z7951 Long term (current) use of inhaled steroids: Secondary | ICD-10-CM | POA: Diagnosis not present

## 2020-01-21 DIAGNOSIS — Z79899 Other long term (current) drug therapy: Secondary | ICD-10-CM | POA: Diagnosis not present

## 2020-01-21 DIAGNOSIS — J4540 Moderate persistent asthma, uncomplicated: Secondary | ICD-10-CM | POA: Insufficient documentation

## 2020-01-21 DIAGNOSIS — Z20822 Contact with and (suspected) exposure to covid-19: Secondary | ICD-10-CM | POA: Diagnosis not present

## 2020-01-21 DIAGNOSIS — M549 Dorsalgia, unspecified: Secondary | ICD-10-CM | POA: Diagnosis not present

## 2020-01-21 DIAGNOSIS — E538 Deficiency of other specified B group vitamins: Secondary | ICD-10-CM | POA: Diagnosis not present

## 2020-01-21 DIAGNOSIS — E559 Vitamin D deficiency, unspecified: Secondary | ICD-10-CM | POA: Insufficient documentation

## 2020-01-21 DIAGNOSIS — R52 Pain, unspecified: Secondary | ICD-10-CM | POA: Diagnosis not present

## 2020-01-21 DIAGNOSIS — J029 Acute pharyngitis, unspecified: Secondary | ICD-10-CM | POA: Diagnosis not present

## 2020-01-21 DIAGNOSIS — K219 Gastro-esophageal reflux disease without esophagitis: Secondary | ICD-10-CM | POA: Insufficient documentation

## 2020-01-21 LAB — POCT URINALYSIS DIPSTICK, ED / UC
Bilirubin Urine: NEGATIVE
Glucose, UA: NEGATIVE mg/dL
Hgb urine dipstick: NEGATIVE
Ketones, ur: NEGATIVE mg/dL
Nitrite: NEGATIVE
Protein, ur: NEGATIVE mg/dL
Specific Gravity, Urine: 1.01 (ref 1.005–1.030)
Urobilinogen, UA: 0.2 mg/dL (ref 0.0–1.0)
pH: 7 (ref 5.0–8.0)

## 2020-01-21 LAB — SARS CORONAVIRUS 2 (TAT 6-24 HRS): SARS Coronavirus 2: NEGATIVE

## 2020-01-21 MED ORDER — HYDROCODONE-ACETAMINOPHEN 5-325 MG PO TABS: 1.0000 | ORAL_TABLET | Freq: Four times a day (QID) | ORAL | 0 refills | Status: DC | PRN

## 2020-01-21 NOTE — ED Triage Notes (Addendum)
Pt c/o sore throat, congestion, runny nose, nausea, painful urination, lack of appetite, subjective, chills onset Saturday, approx 6 days ago. Also c/o lower back pain, bilaterally "hurts when my clothes touch it" for approx 10 days. Pt states she received an infusion for osteoporosis last Tuesday and symptoms began after infusion therapy.  Pt states she had ophthalmic evaluation yesterday at her endocrinologist's direction 2/2 infusion potentially affecting eye. Pt states opthalmology advised pt that both eyes showed new inflammation.   Denies v/d, vaginal discharge, rash to back or vaginal area, cough, SOB.

## 2020-01-21 NOTE — Discharge Instructions (Signed)
Pain medicine as needed Follow up with your doctor The side effects of this infusion can last weeks.

## 2020-01-22 LAB — URINE CULTURE: Culture: NO GROWTH

## 2020-01-24 ENCOUNTER — Ambulatory Visit (INDEPENDENT_AMBULATORY_CARE_PROVIDER_SITE_OTHER): Payer: 59 | Admitting: Nurse Practitioner

## 2020-01-24 ENCOUNTER — Encounter: Payer: Self-pay | Admitting: Nurse Practitioner

## 2020-01-24 ENCOUNTER — Other Ambulatory Visit: Payer: Self-pay

## 2020-01-24 VITALS — BP 118/76 | HR 69 | Temp 97.6°F | Ht 64.0 in | Wt 239.0 lb

## 2020-01-24 DIAGNOSIS — M818 Other osteoporosis without current pathological fracture: Secondary | ICD-10-CM | POA: Diagnosis not present

## 2020-01-24 DIAGNOSIS — R768 Other specified abnormal immunological findings in serum: Secondary | ICD-10-CM | POA: Diagnosis not present

## 2020-01-24 NOTE — Progress Notes (Signed)
I,Yamilka Roman [employer] as a Neurosurgeon for [employer], FNP.,have documented all relevant documentation on the behalf of [name], FNP,as directed by  [name], FNP while in the presence of [name], FNP.  This visit occurred during the SARS-CoV-2 public health emergency.  Safety protocols were in place, including screening questions prior to the visit, additional usage of staff PPE, and extensive cleaning of exam room while observing appropriate contact time as indicated for disinfecting solutions.  Subjective:     Patient ID: [name] , female    DOB: Jul 04, 1975 , 44 y.o.   MRN: [id number]   Chief Complaint  Patient presents with  . Follow-up    HPI  She has an appt with Dr. Corliss Skains in October - she is on the cancellation list.    She reports she had a reaction to a medication for her osteoporosis.  She feels like everything is exacerbated after having an infusion. She also reports she was to have an iron transfusion ordered by the GI.   She is also having eye problems was told she has inflammation and given eye drops. Next appt next month    Past Medical History:  Diagnosis Date  . Allergy   . Anemia   . Angio-edema   . Anxiety   . Apnea 12/08/2019  . Arthritis   . Asthma   . Back pain   . Constipation   . Fatty liver   . GERD (gastroesophageal reflux disease)   . Hx of endometriosis   . Inappropriate sinus tachycardia 12/08/2019  . Insomnia   . Joint pain   . Lactose intolerance   . Macromastia 07/2011  . Migraine   . Morbid obesity (HCC) 12/08/2019  . Osteoporosis   . Urticaria   . Vitamin D deficiency      Family History  Problem Relation Age of Onset  . Diabetes Mother   . Hypertension Mother   . Hyperlipidemia Mother   . Sleep apnea Mother   . Thyroid disease Sister   . Dementia Father   . Heart attack Maternal Grandmother   . Stroke Maternal Grandfather   . Asthma Son   . Healthy Son   . Asthma Daughter   . Healthy Daughter    . Colon cancer Neg Hx   . Colon polyps Neg Hx   . Esophageal cancer Neg Hx   . Rectal cancer Neg Hx   . Stomach cancer Neg Hx      Current Outpatient Medications:  .  cetirizine (ZYRTEC) 10 MG tablet, Take 1 tablet (10 mg total) by mouth daily as needed for rhinitis., Disp: 30 tablet, Rfl: 5 .  EPINEPHrine (EPIPEN 2-PAK) 0.3 mg/0.3 mL IJ SOAJ injection, Inject 0.3 mLs (0.3 mg total) into the muscle as needed for anaphylaxis., Disp: 2 each, Rfl: 2 .  esomeprazole (NEXIUM) 40 MG capsule, Take 1 capsule (40 mg total) by mouth daily at 12 noon., Disp: 30 capsule, Rfl: 6 .  fluticasone furoate-vilanterol (BREO ELLIPTA) 100-25 MCG/INH AEPB, Inhale 1 puff into the lungs daily., Disp: 1 each, Rfl: 11 .  RESTASIS 0.05 % ophthalmic emulsion, 1 drop 2 (two) times daily., Disp: , Rfl:  .  zolpidem (AMBIEN CR) 12.5 MG CR tablet, Take 12.5 mg by mouth at bedtime as needed., Disp: , Rfl:  .  albuterol (VENTOLIN HFA) 108 (90 Base) MCG/ACT inhaler, Inhale 2 puffs into the lungs every 6 (six) hours as needed for wheezing or shortness of breath., Disp: 1 each, Rfl:  11 .  famotidine (PEPCID) 20 MG tablet, Take 20 mg by mouth daily., Disp: , Rfl:  .  predniSONE (DELTASONE) 10 MG tablet, Take 4 tabs po daily x 3 days; then 3 tabs daily x3 days; then 2 tabs daily x3 days; then 1 tab daily x 3 days; then stop, Disp: 30 tablet, Rfl: 0 .  sucralfate (CARAFATE) 1 g tablet, Take 1 tablet (1 g total) by mouth every 6 (six) hours. Slowly dissolve tablet in 1 Tablespoon of distilled water before ingestion, Disp: 90 tablet, Rfl: 2 .  Vitamin D, Ergocalciferol, (DRISDOL) 1.25 MG (50000 UNIT) CAPS capsule, 1 tab q wed and 1 po qd sun, Disp: 8 capsule, Rfl: 0   Allergies  Allergen Reactions  . Aspirin   . Ibuprofen   . Carmie Kanner [Fish Allergy]      Review of Systems  Constitutional: Negative.  Negative for fatigue.  Respiratory: Negative.  Negative for cough.   Cardiovascular: Negative.  Negative for chest pain,  palpitations and leg swelling.  Gastrointestinal: Negative for abdominal pain.  Neurological: Negative for dizziness and headaches.  Psychiatric/Behavioral: Negative.      Today's Vitals   01/24/20 1515  BP: 118/76  Pulse: 69  Temp: 97.6 F (36.4 C)  TempSrc: Oral  Weight: 239 lb (108.4 kg)  Height: 5\' 4"  (1.626 m)  PainSc: 0-No pain   Body mass index is 41.02 kg/m.   Objective:  Physical Exam Vitals reviewed.  Constitutional:      General: She is not in acute distress.    Appearance: Normal appearance. She is well-developed. She is obese.  Cardiovascular:     Pulses: Normal pulses.     Heart sounds: Normal heart sounds. No murmur heard.   Pulmonary:     Effort: Pulmonary effort is normal. No respiratory distress.     Breath sounds: Normal breath sounds.  Neurological:     General: No focal deficit present.     Mental Status: She is alert and oriented to person, place, and time.     Cranial Nerves: No cranial nerve deficit.  Psychiatric:        Mood and Affect: Mood normal. Mood is not anxious.        Speech: Speech normal.        Behavior: Behavior normal.        Thought Content: Thought content normal.        Judgment: Judgment normal.         Assessment And Plan:     1. ANA positive  I have explained to the patient to keep her appt with Rheumatology as she needs more extensive testing to evaluate her positive ANA.   2. Other osteoporosis without current pathological fracture  She has also been receiving IV infusions for her osteoporosis however had a reaction     Patient was given opportunity to ask questions. Patient verbalized understanding of the plan and was able to repeat key elements of the plan. All questions were answered to their satisfaction.   , FNP, have reviewed all documentation for this visit. The documentation on 02/23/20 for the exam, diagnosis, procedures, and orders are all accurate and complete.  THE PATIENT IS  ENCOURAGED TO PRACTICE SOCIAL DISTANCING DUE TO THE COVID-19 PANDEMIC.

## 2020-01-24 NOTE — ED Provider Notes (Signed)
[facility]    CSN: [id number] Arrival date & time: 01/21/20  0820      History   Chief Complaint Chief Complaint  Patient presents with  . Sore Throat  . Back Pain  . Dysuria    HPI [name] is a 44 y.o. female.   Patient is a 44 year old female presents today with sore throat, congestion, runny nose, nausea, painful urination, lack of appetite, chills, back pain.  This started approximately 6 days ago.  Started after getting infusion of Reclast for osteoporosis.  She is also has some inflammation to bilateral eyes.  No trouble seeing or pain in the eyes.  No rashes, fever, vaginal discharge, dysuria, hematuria urinary frequency.  No chest pain or shortness of breath.  No cough.     Past Medical History:  Diagnosis Date  . Allergy   . Anemia   . Angio-edema   . Anxiety   . Apnea 12/08/2019  . Arthritis   . Asthma   . Back pain   . Constipation   . Fatty liver   . GERD (gastroesophageal reflux disease)   . Hx of endometriosis   . Inappropriate sinus tachycardia 12/08/2019  . Insomnia   . Joint pain   . Lactose intolerance   . Macromastia 07/2011  . Migraine   . Morbid obesity (HCC) 12/08/2019  . Osteoporosis   . Urticaria   . Vitamin D deficiency     Patient Active Problem List   Diagnosis Date Noted  . Vitamin D deficiency 01/04/2020  . Fatty liver 01/04/2020  . B12 deficiency 01/04/2020  . Moderate persistent asthma without complication 12/31/2019  . Inappropriate sinus tachycardia 12/08/2019  . Morbid obesity (HCC) 12/08/2019  . Apnea 12/08/2019  . Anaphylactic reaction due to food, subsequent encounter 11/22/2019  . Allergic rhinoconjunctivitis 11/22/2019  . Idiopathic urticaria 11/22/2019  . Generalized anxiety disorder 04/08/2018  . Atypical chest pain   . Regurgitation of food   . Laryngopharyngeal reflux (LPR) 07/30/2017  . Throat soreness 07/30/2017  . ANA positive 12/30/2016  . Gastroesophageal reflux disease 06/27/2016  .  Disturbance of skin sensation 04/27/2014  . Dizziness and giddiness 03/14/2014  . Headache, migraine 03/14/2014  . Myalgia and myositis, unspecified 06/07/2013  . Nausea and vomiting in adult 08/22/2012  . Viral syndrome 08/22/2012  . Pelvic pain in female 12/18/2010  . PID (acute pelvic inflammatory disease) 12/18/2010  . ANEMIA-IRON DEFICIENCY 10/15/2006  . HERPES, GENITAL NOS 09/30/2006  . DISORDER, NONORGANIC SLEEP NOS 09/30/2006  . CONSTIPATION NOS 09/30/2006  . NEURITIS, LUMBOSACRAL NOS 09/30/2006  . HX, PERSONAL, MENTAL DISORDER NOS 09/30/2006    Past Surgical History:  Procedure Laterality Date  . 24 HOUR PH STUDY N/A 01/12/2018   Procedure: 24 HOUR PH STUDY;  Surgeon: Napoleon Form, MD;  Location: [facility];  Service: Endoscopy;  Laterality: N/A;  . ABDOMINAL HYSTERECTOMY  12/12/2003  . ADENOIDECTOMY  05/2011  . ANKLE ARTHROSCOPY  12/29/2007   right; with extensive debridement  . BLADDER NECK RECONSTRUCTION  01/14/2011   Procedure: BLADDER NECK REPAIR;  Surgeon: Reva Bores, MD;  Location: [facility];  Service: Gynecology;  Laterality: N/A;  Laparoscopic Repair of Incidental Cystotomy  . BREAST REDUCTION SURGERY  08/05/2011   Procedure: MAMMARY REDUCTION  (BREAST);  Surgeon: Louisa Second, MD;  Location: [facility];  Service: Plastics;  Laterality: Bilateral;  bilateral  . CESAREAN SECTION  12/29/2000; 1994  . DILATION AND CURETTAGE OF UTERUS  07/08/2003  open laparoscopy  . ESOPHAGEAL MANOMETRY N/A 01/12/2018   Procedure: ESOPHAGEAL MANOMETRY (EM);  Surgeon: Napoleon FormNandigam, Kavitha V, MD;  Location: [facility];  Service: Endoscopy;  Laterality: N/A;  . GIVENS CAPSULE STUDY N/A 06/23/2017   Procedure: GIVENS CAPSULE STUDY;  Surgeon: Benancio DeedsArmbruster, Steven P, MD;  [facility];  Service: Gastroenterology;  Laterality: N/A;  . LAPAROSCOPIC SALPINGOOPHERECTOMY  01/14/2011   left  . PH IMPEDANCE STUDY N/A 01/12/2018   Procedure: PH IMPEDANCE STUDY;  Surgeon:  Napoleon FormNandigam, Kavitha V, MD;  [facility]: [facility];  Service: Endoscopy;  Laterality: N/A;  . REPAIR PERONEAL TENDONS ANKLE  01/03/2010   repair right subluxing peroneal tendons  . RESECTION DISTAL CLAVICAL Right 09/25/2017   Procedure: RESECTION DISTAL CLAVICAL;  Surgeon: Bjorn PippinVarkey, Dax T, MD;  Location: [facility];  Service: Orthopedics;  Laterality: Right;  . RIGHT OOPHORECTOMY     with lysis of adhesions  . SHOULDER ACROMIOPLASTY Right 09/25/2017   Procedure: SHOULDER ACROMIOPLASTY;  Surgeon: Bjorn PippinVarkey, Dax T, MD;  Location: [facility];  Service: Orthopedics;  Laterality: Right;  . SHOULDER ARTHROSCOPY WITH DEBRIDEMENT AND BICEP TENDON REPAIR Right 09/25/2017   Procedure: SHOULDER ARTHROSCOPY WITH DEBRIDEMENT AND BICEP TENDON REPAIR;  Surgeon: Bjorn PippinVarkey, Dax T, MD;  Location: [facility];  Service: Orthopedics;  Laterality: Right;  . SHOULDER ARTHROSCOPY WITH ROTATOR CUFF REPAIR Right 09/25/2017   Procedure: SHOULDER ARTHROSCOPY WITH ROTATOR CUFF REPAIR;  Surgeon: Bjorn PippinVarkey, Dax T, MD;  Location: [facility];  Service: Orthopedics;  Laterality: Right;  . TONSILLECTOMY  as a child  . TUBAL LIGATION  12/29/2000  . UPPER GASTROINTESTINAL ENDOSCOPY      OB History    Gravida  2   Para  2   Term  2   Preterm      AB      Living  2     SAB      TAB      Ectopic      Multiple      Live Births               Home Medications    Prior to Admission medications   Medication Sig Start Date End Date Taking? Authorizing Provider  esomeprazole (NEXIUM) 40 MG capsule Take 1 capsule (40 mg total) by mouth daily at 12 noon. 10/05/19  Yes Armbruster, Willaim RayasSteven P, MD  fluticasone furoate-vilanterol (BREO ELLIPTA) 100-25 MCG/INH AEPB Inhale 1 puff into the lungs daily. 09/24/19  Yes Charlott Holleresai, Nikita S, MD  ibuprofen (ADVIL) 800 MG tablet Take 800 mg by mouth every 6 (six) hours as needed. 12/13/19  Yes [provider]  RESTASIS 0.05 %  ophthalmic emulsion 1 drop 2 (two) times daily. 11/04/19  Yes [provider]  Vitamin D, Ergocalciferol, (DRISDOL) 1.25 MG (50000 UNIT) CAPS capsule 1 tab q wed and 1 po qd sun 01/18/20  Yes Opalski, Deborah, DO  zolpidem (AMBIEN CR) 12.5 MG CR tablet Take 12.5 mg by mouth at bedtime as needed. 09/13/19  Yes [provider]  albuterol (VENTOLIN HFA) 108 (90 Base) MCG/ACT inhaler Inhale 2 puffs into the lungs every 6 (six) hours as needed for wheezing or shortness of breath.    [provider]  cetirizine (ZYRTEC) 10 MG tablet Take 1 tablet (10 mg total) by mouth daily as needed for rhinitis. 11/22/19   Hetty BlendAmbs, Anne M, FNP  EPINEPHrine (EPIPEN 2-PAK) 0.3 mg/0.3 mL IJ SOAJ injection Inject 0.3 mLs (0.3 mg total) into the  muscle as needed for anaphylaxis. 12/31/19   Hetty Blend, FNP  HYDROcodone-acetaminophen (NORCO/VICODIN) 5-325 MG tablet Take 1-2 tablets by mouth every 6 (six) hours as needed. 01/21/20   Dahlia Byes A, NP  ferrous sulfate 325 (65 FE) MG EC tablet Take 1 tablet (325 mg total) by mouth 2 (two) times daily with a meal. 02/05/17 08/25/19  Armbruster, Willaim Rayas, MD  Fluticasone-Salmeterol (ADVAIR DISKUS) 250-50 MCG/DOSE AEPB Advair Diskus 250 mcg-50 mcg/dose powder for inhalation  10/06/19  [provider]  loratadine (CLARITIN) 10 MG tablet Take 1 tablet (10 mg total) by mouth daily. 12/24/17 01/05/20  Kallie Locks, FNP    Family History Family History  Problem Relation Age of Onset  . Diabetes Mother   . Hypertension Mother   . Hyperlipidemia Mother   . Sleep apnea Mother   . Thyroid disease Sister   . Heart attack Maternal Grandmother   . Stroke Maternal Grandfather   . Asthma Son   . Asthma Daughter   . Colon cancer Neg Hx   . Colon polyps Neg Hx   . Esophageal cancer Neg Hx   . Rectal cancer Neg Hx   . Stomach cancer Neg Hx     Social History Social History   Tobacco Use  . Smoking status: Never Smoker  . Smokeless tobacco: Never Used    Vaping Use  . Vaping Use: Never used  Substance Use Topics  . Alcohol use: Yes    Comment: occasional   . Drug use: No     Allergies   Rosanne Ashing allergy]   Review of Systems Review of Systems   Physical Exam Triage Vital Signs ED Triage Vitals  Enc Vitals Group     BP 01/21/20 0947 136/89     Pulse Rate 01/21/20 0948 65     Resp 01/21/20 0948 18     Temp 01/21/20 0947 98.3 F (36.8 C)     Temp Source 01/21/20 0947 Oral     SpO2 01/21/20 0948 100 %     Weight --      Height --      Head Circumference --      Peak Flow --      Pain Score 01/21/20 0947 8     Pain Loc --      Pain Edu? --      Excl. in GC? --    No data found.  Updated Vital Signs BP 136/89 (BP Location: Right Wrist)   Pulse 65   Temp 98.3 F (36.8 C) (Oral)   Resp 18   LMP 12/27/2003   SpO2 100%   Visual Acuity Right Eye Distance:   Left Eye Distance:   Bilateral Distance:    Right Eye Near:   Left Eye Near:    Bilateral Near:     Physical Exam Vitals and nursing note reviewed.  Constitutional:      General: She is not in acute distress.    Appearance: Normal appearance. She is not ill-appearing, toxic-appearing or diaphoretic.  HENT:     Head: Normocephalic.     Nose: Nose normal.  Eyes:     Conjunctiva/sclera: Conjunctivae normal.  Pulmonary:     Effort: Pulmonary effort is normal.  Musculoskeletal:        General: Normal range of motion.     Cervical back: Normal range of motion.  Skin:    General: Skin is warm and dry.     Findings: No rash.  Neurological:  Mental Status: She is alert.  Psychiatric:        Mood and Affect: Mood normal.      UC Treatments / Results  Labs (all labs ordered are listed, but only abnormal results are displayed) Labs Reviewed  POCT URINALYSIS DIPSTICK, ED / UC - Abnormal; Notable for the following components:      Result Value   Leukocytes,Ua TRACE (*)    All other components within normal limits  SARS CORONAVIRUS 2 (TAT  6-24 HRS)  URINE CULTURE    EKG   Radiology No results found.  Procedures Procedures (including critical care time)  Medications Ordered in UC Medications - No data to display  Initial Impression / Assessment and Plan / UC Course  I have reviewed the triage vital signs and the nursing notes.  Pertinent labs & imaging results that were available during my care of the patient were reviewed by me and considered in my medical decision making (see chart for details).     Body aches, chills Patient most likely having side effects of the Reclast injection. Told that she could have flulike symptoms as a side effect for a few weeks. We will go ahead and rule out Covid here today. Recommended follow-up with her doctor for any continued problems. Urine with trace leuks sending for culture. Not likely UTI or pyelonephritis. Hydrocodone for pain as needed  Final Clinical Impressions(s) / UC Diagnoses   Final diagnoses:  Generalized body aches     Discharge Instructions     Pain medicine as needed Follow up with your doctor The side effects of this infusion can last weeks.      ED Prescriptions    Medication Sig Dispense Auth. Provider   HYDROcodone-acetaminophen (NORCO/VICODIN) 5-325 MG tablet Take 1-2 tablets by mouth every 6 (six) hours as needed. 10 tablet Nahima Ales A, NP     I have reviewed the PDMP during this encounter.   Dahlia Byes A, NP 01/24/20 1156

## 2020-01-24 NOTE — Patient Instructions (Addendum)
COVID-19 Vaccine Information can be found at: [email or web address] For questions related to vaccine distribution or appointments, please email [email or web address] or call [phone].   Discussed risk of covid 19 and if she changes her mind about the vaccine to call the office.  Encouraged to take multivitamin, vitamin d, vitamin c and zinc to increase immune system. Aware can call office if would like to have vaccine here at office.   

## 2020-01-25 ENCOUNTER — Encounter: Payer: Self-pay | Admitting: Internal Medicine

## 2020-01-25 ENCOUNTER — Other Ambulatory Visit: Payer: Self-pay

## 2020-01-25 ENCOUNTER — Ambulatory Visit (INDEPENDENT_AMBULATORY_CARE_PROVIDER_SITE_OTHER): Payer: 59 | Admitting: Internal Medicine

## 2020-01-25 DIAGNOSIS — J4540 Moderate persistent asthma, uncomplicated: Secondary | ICD-10-CM | POA: Diagnosis not present

## 2020-01-25 MED ORDER — ALBUTEROL SULFATE HFA 108 (90 BASE) MCG/ACT IN AERS: 2.0000 | INHALATION_SPRAY | Freq: Four times a day (QID) | RESPIRATORY_TRACT | 11 refills | Status: DC | PRN

## 2020-01-25 NOTE — Progress Notes (Signed)
f °

## 2020-01-25 NOTE — Patient Instructions (Signed)
The patient should have follow up scheduled with myself in 6 months.   

## 2020-01-25 NOTE — Progress Notes (Signed)
[name]    [id number]    08/05/1975  Primary Care Physician:Moore, Lolita Cram, FNP Date of Appointment: 01/25/2020 Established Patient Visit  Chief complaint:   Chief Complaint  Patient presents with  . Follow-up    no complaints    HPI: [name] is a 44 y.o. woman with childhood bronchitis and asthma.   Interval Updates: Beverly Gust and feels better. Albuterol use has dropped down to about once a month. No hospitalizations or ED visits in the interim.  Current Regimen:  prn albuterol . Asthma Triggers: exercise, talking for a long time.  Exacerbations in the last year: 1 (previously has gone multiple times a year since 2018.) History of hospitalization or intubation: none.  Allergy Testing: in 2019 environmental allergy skin prick testing is positive to box elder, dust mites and cockroach. Select food allergy skin prick testing was positive to salmon GERD: yes on protonix BID (still having heart burn.) on carafate as well.  Allergic Rhinitis: yes taking claritin every day.  ACT:  Asthma Control Test ACT Total Score  12/31/2019 21  09/24/2019 16   FeNO: 6 ppb on 5/21  I have reviewed the patient's family social and past medical history and updated as appropriate.   Past Medical History:  Diagnosis Date  . Allergy   . Anemia   . Angio-edema   . Anxiety   . Apnea 12/08/2019  . Arthritis   . Asthma   . Back pain   . Constipation   . Fatty liver   . GERD (gastroesophageal reflux disease)   . Hx of endometriosis   . Inappropriate sinus tachycardia 12/08/2019  . Insomnia   . Joint pain   . Lactose intolerance   . Macromastia 07/2011  . Migraine   . Morbid obesity (HCC) 12/08/2019  . Osteoporosis   . Urticaria   . Vitamin D deficiency     Past Surgical History:  Procedure Laterality Date  . 24 HOUR PH STUDY N/A 01/12/2018   Procedure: 24 HOUR PH STUDY;  Surgeon: Napoleon Form, MD;  [facility];  Service: Endoscopy;   Laterality: N/A;  . ABDOMINAL HYSTERECTOMY  12/12/2003  . ADENOIDECTOMY  05/2011  . ANKLE ARTHROSCOPY  12/29/2007   right; with extensive debridement  . BLADDER NECK RECONSTRUCTION  01/14/2011   Procedure: BLADDER NECK REPAIR;  Surgeon: Reva Bores, MD;  Location: [facility];  Service: Gynecology;  Laterality: N/A;  Laparoscopic Repair of Incidental Cystotomy  . BREAST REDUCTION SURGERY  08/05/2011   Procedure: MAMMARY REDUCTION  (BREAST);  Surgeon: Louisa Second, MD;  Location: [facility];  Service: Plastics;  Laterality: Bilateral;  bilateral  . CESAREAN SECTION  12/29/2000; 1994  . DILATION AND CURETTAGE OF UTERUS  07/08/2003   open laparoscopy  . ESOPHAGEAL MANOMETRY N/A 01/12/2018   Procedure: ESOPHAGEAL MANOMETRY (EM);  Surgeon: Napoleon Form, MD;  [facility];  Service: Endoscopy;  Laterality: N/A;  . GIVENS CAPSULE STUDY N/A 06/23/2017   Procedure: GIVENS CAPSULE STUDY;  Surgeon: Benancio Deeds, MD;  Location: [facility];  Service: Gastroenterology;  Laterality: N/A;  . LAPAROSCOPIC SALPINGOOPHERECTOMY  01/14/2011   left  . PH IMPEDANCE STUDY N/A 01/12/2018   Procedure: PH IMPEDANCE STUDY;  Surgeon: Napoleon Form, MD;  [facility];  Service: Endoscopy;  Laterality: N/A;  . REPAIR PERONEAL TENDONS ANKLE  01/03/2010   repair right subluxing peroneal tendons  . RESECTION DISTAL CLAVICAL Right 09/25/2017  Procedure: RESECTION DISTAL CLAVICAL;  Surgeon: Bjorn Pippin, MD;  Location: [facility];  Service: Orthopedics;  Laterality: Right;  . RIGHT OOPHORECTOMY     with lysis of adhesions  . SHOULDER ACROMIOPLASTY Right 09/25/2017   Procedure: SHOULDER ACROMIOPLASTY;  Surgeon: Bjorn Pippin, MD;  Location: [facility];  Service: Orthopedics;  Laterality: Right;  . SHOULDER ARTHROSCOPY WITH DEBRIDEMENT AND BICEP TENDON REPAIR Right 09/25/2017   Procedure: SHOULDER ARTHROSCOPY WITH DEBRIDEMENT AND BICEP TENDON REPAIR;   Surgeon: Bjorn Pippin, MD;  Location: [facility];  Service: Orthopedics;  Laterality: Right;  . SHOULDER ARTHROSCOPY WITH ROTATOR CUFF REPAIR Right 09/25/2017   Procedure: SHOULDER ARTHROSCOPY WITH ROTATOR CUFF REPAIR;  Surgeon: Bjorn Pippin, MD;  Location: [facility];  Service: Orthopedics;  Laterality: Right;  . TONSILLECTOMY  as a child  . TUBAL LIGATION  12/29/2000  . UPPER GASTROINTESTINAL ENDOSCOPY      Family History  Problem Relation Age of Onset  . Diabetes Mother   . Hypertension Mother   . Hyperlipidemia Mother   . Sleep apnea Mother   . Thyroid disease Sister   . Heart attack Maternal Grandmother   . Stroke Maternal Grandfather   . Asthma Son   . Asthma Daughter   . Colon cancer Neg Hx   . Colon polyps Neg Hx   . Esophageal cancer Neg Hx   . Rectal cancer Neg Hx   . Stomach cancer Neg Hx     Social History   Occupational History  . Occupation: Facilities manager: OTHER    Comment: [employer]  Tobacco Use  . Smoking status: Never Smoker  . Smokeless tobacco: Never Used  Vaping Use  . Vaping Use: Never used  Substance and Sexual Activity  . Alcohol use: Yes    Comment: occasional   . Drug use: No  . Sexual activity: Not on file    Physical Exam: Blood pressure 116/72, pulse 62, temperature 98 F (36.7 C), temperature source Other (Comment), height 5\' 4"  (1.626 m), weight 241 lb 3.2 oz (109.4 kg), last menstrual period 12/27/2003, SpO2 100 %.  Gen:      No acute distress Lungs:    No increased respiratory effort, symmetric chest wall excursion, clear to auscultation bilaterally, no wheezes or crackles CV:         Regular rate and rhythm; no murmurs, rubs, or gallops.  No pedal edema   Data Reviewed: Imaging: I have personally reviewed the chest xray April 2021 is clear.   PFTs:  PFT Results Latest Ref Rng & Units 03/12/2017  FVC-Pre L 3.52  FVC-Predicted Pre % 114  FVC-Post L 3.25  FVC-Predicted Post % 106  Pre  FEV1/FVC % % 79  Post FEV1/FCV % % 86  FEV1-Pre L 2.77  FEV1-Predicted Pre % 110  FEV1-Post L 2.78  DLCO uncorrected ml/min/mmHg 23.63  DLCO UNC% % 97  DLVA Predicted % 113  TLC L 4.54  TLC % Predicted % 89  RV % Predicted % 78   I have personally reviewed the patient's PFTs and spirometry today shows: No airflow limitation but there was a significant response to bronchodilator challenge. Spirometry August 2021 shows normal spirometry.    Labs:IgE 33, environmental IgE panel negative (immunocap) in 2019.  Immunization status: Immunization History  Administered Date(s) Administered  . Influenza,inj,Quad PF,6+ Mos 02/15/2016, 12/24/2017, 02/24/2019  . Td 05/06/1994  . Tdap 12/06/2015    Assessment:  Moderate persistent asthma.  Allergic Rhinitis Esophageal Dysmotility and uncontrolled reflux s/p EGD  Plan/Recommendations: Continue Breo 100 once daily Continue prn albuterol Continue flonase and antihistamines.  Return to Care: Return in about 6 months (around 07/24/2020).   Durel Salts, MD Pulmonary and Critical Care Medicine [facility] Office:[phone]

## 2020-01-26 ENCOUNTER — Ambulatory Visit (HOSPITAL_BASED_OUTPATIENT_CLINIC_OR_DEPARTMENT_OTHER): Payer: 59 | Attending: Cardiovascular Disease | Admitting: Cardiovascular Disease

## 2020-01-26 DIAGNOSIS — G4730 Sleep apnea, unspecified: Secondary | ICD-10-CM | POA: Insufficient documentation

## 2020-01-26 DIAGNOSIS — R0683 Snoring: Secondary | ICD-10-CM | POA: Diagnosis not present

## 2020-01-26 DIAGNOSIS — R0902 Hypoxemia: Secondary | ICD-10-CM | POA: Diagnosis not present

## 2020-01-26 DIAGNOSIS — Z79899 Other long term (current) drug therapy: Secondary | ICD-10-CM | POA: Insufficient documentation

## 2020-01-26 DIAGNOSIS — E785 Hyperlipidemia, unspecified: Secondary | ICD-10-CM | POA: Insufficient documentation

## 2020-01-26 DIAGNOSIS — Z9189 Other specified personal risk factors, not elsewhere classified: Secondary | ICD-10-CM | POA: Insufficient documentation

## 2020-01-26 DIAGNOSIS — R0681 Apnea, not elsewhere classified: Secondary | ICD-10-CM

## 2020-01-26 DIAGNOSIS — R7303 Prediabetes: Secondary | ICD-10-CM | POA: Insufficient documentation

## 2020-01-26 DIAGNOSIS — R400 Somnolence: Secondary | ICD-10-CM | POA: Diagnosis not present

## 2020-01-26 DIAGNOSIS — R7309 Other abnormal glucose: Secondary | ICD-10-CM | POA: Insufficient documentation

## 2020-01-26 DIAGNOSIS — E559 Vitamin D deficiency, unspecified: Secondary | ICD-10-CM | POA: Insufficient documentation

## 2020-01-27 ENCOUNTER — Encounter: Payer: Self-pay | Admitting: Neurology

## 2020-01-27 ENCOUNTER — Other Ambulatory Visit: Payer: Self-pay

## 2020-01-27 ENCOUNTER — Ambulatory Visit (INDEPENDENT_AMBULATORY_CARE_PROVIDER_SITE_OTHER): Payer: 59 | Admitting: Neurology

## 2020-01-27 VITALS — BP 133/87 | HR 77 | Resp 20 | Ht 64.0 in | Wt 240.0 lb

## 2020-01-27 DIAGNOSIS — M791 Myalgia, unspecified site: Secondary | ICD-10-CM | POA: Diagnosis not present

## 2020-01-27 DIAGNOSIS — R1310 Dysphagia, unspecified: Secondary | ICD-10-CM | POA: Diagnosis not present

## 2020-01-27 DIAGNOSIS — M6281 Muscle weakness (generalized): Secondary | ICD-10-CM | POA: Diagnosis not present

## 2020-01-27 DIAGNOSIS — R1319 Other dysphagia: Secondary | ICD-10-CM

## 2020-01-27 NOTE — Progress Notes (Signed)
NEUROLOGY FOLLOW UP OFFICE NOTE  [name] [id number]  HISTORY OF PRESENT ILLNESS: [name] is a 44 year old right-handed female who follows up for multiple symptomatology.  GI notes and MRI brain and cervical spine personally reviewed.  UPDATE: Patient underwent further workup for muscle weakness, myalgias, muscle spasms in her neck, right shoulder pain, dysphagia, numbness and tingling of face and extremities.  She has also been evaluated by GI and ortho.  She underwent upper endoscopy and esophageal dilatation which she says was ineffective.  Given positive ANA, she has upcoming appointment with rheumatology. 10/19/2019 LABS:  Myasthenia gravis panel (AChR binding abs, striated muscle ab, MuSK abs) negative 11/05/2019 MRI RIGHT SHOULDER WO:  Status post AC joint resection and mild supraspinatus tendinopathy but no acute findings. 12/02/2019 LABS:  B12 1,249 12/21/2019 NCV-EMG:  Normal 12/19/2019 MRI CERVICAL SPINE WO:  Minimal cervical spine degenerative changes.  No significant disc bulge, spinal canal or neural foraminal narrowing. 01/04/2020 MRI BRAIN W WO:  Normal 01/05/2020 UPPER ENDOSCOPY:  3 cm hiatal hernia.  Otherwise, normal.  HISTORY: In 2019, she started having trouble swallowing. At first, she had trouble only with solid food but has since included liquids. She denies food getting stuck but it seems to take a while for it to go down. She also reports shortness of breath both exertional or at rest. She also notes associated wheezing and chest discomfort.She was recently evaluated in the ED last month for chest pain. Cardiac and pulmonary workup was negative. She reports that she has been experiencing anxiety. She has been evaluated by gastroenterology for regurgitation. Gastric emptying study from July was normal. At that time, she reported to GI that she did not have dysphagia. Esophogeal manometryfrom September 2019 was normal. Myasthenia panel,  including Anti-striation antibody, AChR binding antibodies, ACh Blocking antibodies, ACh modulating antibodies. Lupus anticoagulant panel was negative. B12 was normal. Thyroid panel was negative.  She also reports intermittent horizontal double vision while driving or looking at something for prolonged period. She saw her ophthalmologist, Dr. Dione Booze, who noted possible right ptosis. She reports neck pain as well. She also reports weakness and heaviness in the legs and reports 2 falls over the past year. She has also reported having foodallergies.She underwent food allergy panel which was normal. She had a change in insurance and couldn't complete workup.  However, she reports worsening dysphagia over the past 3 months, trouble with swallowing solid foods.  Modified barium swallow from 09/02/2019 showed mild intermittent esophageal dysmotility.  Also feels generalized weakness and myalgias.  No double vision.  ANA from June 3 was positive.  She has history of multiple neurologic and somatic symptoms. She has previously endorsed diffuse pain and allodynia with sensations of electric shocks throughout her body. She has history of migraines. Of note, she had an MRI of the head without contrast on 01/16/17 to evaluate right arm numbness, which was personally reviewed and was unremarkable. She was subsequently diagnosed with a complicated migraine. She has history of fatigue. Past sleep study was negative. She has previously been evaluated by rheumatology who has diagnosed her with fibromyalgia. She has history of reflux and regurgitation. She reportedly had a gastric emptying study in 2011. Subsequent gastric imaging study from 2015 was normal. She has been evaluated by multiple ENTs. She has anxiety.  She denies family history of autoimmune disease on her mother's side (she is not familiar with her father's side of the family).   PAST MEDICAL HISTORY: Past Medical  History:  Diagnosis Date    . Allergy   . Anemia   . Angio-edema   . Anxiety   . Apnea 12/08/2019  . Arthritis   . Asthma   . Back pain   . Constipation   . Fatty liver   . GERD (gastroesophageal reflux disease)   . Hx of endometriosis   . Inappropriate sinus tachycardia 12/08/2019  . Insomnia   . Joint pain   . Lactose intolerance   . Macromastia 07/2011  . Migraine   . Morbid obesity (HCC) 12/08/2019  . Osteoporosis   . Urticaria   . Vitamin D deficiency     MEDICATIONS: Current Outpatient Medications on File Prior to Visit  Medication Sig Dispense Refill  . albuterol (VENTOLIN HFA) 108 (90 Base) MCG/ACT inhaler Inhale 2 puffs into the lungs every 6 (six) hours as needed for wheezing or shortness of breath. 1 each 11  . cetirizine (ZYRTEC) 10 MG tablet Take 1 tablet (10 mg total) by mouth daily as needed for rhinitis. 30 tablet 5  . EPINEPHrine (EPIPEN 2-PAK) 0.3 mg/0.3 mL IJ SOAJ injection Inject 0.3 mLs (0.3 mg total) into the muscle as needed for anaphylaxis. 2 each 2  . esomeprazole (NEXIUM) 40 MG capsule Take 1 capsule (40 mg total) by mouth daily at 12 noon. 30 capsule 6  . fluticasone furoate-vilanterol (BREO ELLIPTA) 100-25 MCG/INH AEPB Inhale 1 puff into the lungs daily. 1 each 11  . ibuprofen (ADVIL) 800 MG tablet Take 800 mg by mouth every 6 (six) hours as needed.    . RESTASIS 0.05 % ophthalmic emulsion 1 drop 2 (two) times daily.    . Vitamin D, Ergocalciferol, (DRISDOL) 1.25 MG (50000 UNIT) CAPS capsule 1 tab q wed and 1 po qd sun 8 capsule 0  . zolpidem (AMBIEN CR) 12.5 MG CR tablet Take 12.5 mg by mouth at bedtime as needed.    . [DISCONTINUED] ferrous sulfate 325 (65 FE) MG EC tablet Take 1 tablet (325 mg total) by mouth 2 (two) times daily with a meal. 60 tablet 3  . [DISCONTINUED] Fluticasone-Salmeterol (ADVAIR DISKUS) 250-50 MCG/DOSE AEPB Advair Diskus 250 mcg-50 mcg/dose powder for inhalation    . [DISCONTINUED] loratadine (CLARITIN) 10 MG tablet Take 1 tablet (10 mg total) by mouth  daily. 30 tablet 11   No current facility-administered medications on file prior to visit.    ALLERGIES: Allergies  Allergen Reactions  . Rosanne Ashing Allergy]     FAMILY HISTORY: Family History  Problem Relation Age of Onset  . Diabetes Mother   . Hypertension Mother   . Hyperlipidemia Mother   . Sleep apnea Mother   . Thyroid disease Sister   . Heart attack Maternal Grandmother   . Stroke Maternal Grandfather   . Asthma Son   . Asthma Daughter   . Colon cancer Neg Hx   . Colon polyps Neg Hx   . Esophageal cancer Neg Hx   . Rectal cancer Neg Hx   . Stomach cancer Neg Hx     SOCIAL HISTORY: Social History   Socioeconomic History  . Marital status: Divorced    Spouse name: Not on file  . Number of children: 2  . Years of education: 12th  . Highest education level: Not on file  Occupational History  . Occupation: Facilities manager: OTHER    Comment: [employer]  Tobacco Use  . Smoking status: Never Smoker  . Smokeless tobacco: Never Used  Vaping Use  .  Vaping Use: Never used  Substance and Sexual Activity  . Alcohol use: Yes    Comment: occasional   . Drug use: No  . Sexual activity: Not on file  Other Topics Concern  . Not on file  Social History Narrative   Pt lives in 2 story town home with her son   Has 2 children   High school Paramedic for [employer]   Right handed   Social Determinants of Health   Financial Resource Strain:   . Difficulty of Paying Living Expenses: Not on file  Food Insecurity: No Food Insecurity  . Worried About Programme researcher, broadcasting/film/video in the Last Year: Never true  . Ran Out of Food in the Last Year: Never true  Transportation Needs: No Transportation Needs  . Lack of Transportation (Medical): No  . Lack of Transportation (Non-Medical): No  Physical Activity:   . Days of Exercise per Week: Not on file  . Minutes of Exercise per Session: Not on file  Stress:   . Feeling of Stress : Not on file  Social  Connections:   . Frequency of Communication with Friends and Family: Not on file  . Frequency of Social Gatherings with Friends and Family: Not on file  . Attends Religious Services: Not on file  . Active Member of Clubs or Organizations: Not on file  . Attends Banker Meetings: Not on file  . Marital Status: Not on file  Intimate Partner Violence:   . Fear of Current or Ex-Partner: Not on file  . Emotionally Abused: Not on file  . Physically Abused: Not on file  . Sexually Abused: Not on file    PHYSICAL EXAM: Blood pressure 133/87, pulse 77, resp. rate 20, height 5\' 4"  (1.626 m), weight 240 lb (108.9 kg), last menstrual period 12/27/2003, SpO2 97 %. General: No acute distress.  Patient appears well-groomed.   Head:  Normocephalic/atraumatic Eyes:  Fundi examined but not visualized Neck: supple, no paraspinal tenderness, full range of motion Heart:  Regular rate and rhythm Lungs:  Clear to auscultation bilaterally Back: No paraspinal tenderness Neurological Exam: alert and oriented to person, place, and time. Attention span and concentration intact, recent and remote memory intact, fund of knowledge intact.  Speech fluent and not dysarthric, language intact.  CN II-XII intact. Bulk and tone normal, muscle strength 5/5 throughout.  Sensation to pinprick and vibration intact.  Deep tendon reflexes 2+ throughout, toes downgoing.  Finger to nose and heel to shin testing intact.  Gait normal, Romberg negative.  IMPRESSION: Multiple subjective symptomatology including dysphagia, muscle weakness, myalgias, numbness and paresthesias of face and extremities.  She has undergone extensive workup which has been negative for any CNS etiology (brain and spinal cord) or PNS etiology (myopathy, neuropathy, neuromuscular junction) or both (motor neuron disease).  She has a positive ANA but clinical significance is unclear.  She has upcoming appointment with rheumatology.  PLAN: Follow up  with PCP  12/29/2003, DO  CC: Shon Millet, FNP

## 2020-01-27 NOTE — Patient Instructions (Signed)
Neurologic workup is negative.  I recommend following up with your PCP

## 2020-01-31 ENCOUNTER — Ambulatory Visit (INDEPENDENT_AMBULATORY_CARE_PROVIDER_SITE_OTHER): Payer: 59 | Admitting: Family Medicine

## 2020-01-31 ENCOUNTER — Encounter (INDEPENDENT_AMBULATORY_CARE_PROVIDER_SITE_OTHER): Payer: Self-pay | Admitting: Family Medicine

## 2020-01-31 ENCOUNTER — Other Ambulatory Visit: Payer: Self-pay

## 2020-01-31 VITALS — BP 102/69 | HR 69 | Temp 97.9°F | Ht 64.0 in | Wt 234.6 lb

## 2020-01-31 DIAGNOSIS — D509 Iron deficiency anemia, unspecified: Secondary | ICD-10-CM | POA: Diagnosis not present

## 2020-01-31 DIAGNOSIS — Z6841 Body Mass Index (BMI) 40.0 and over, adult: Secondary | ICD-10-CM

## 2020-01-31 DIAGNOSIS — E559 Vitamin D deficiency, unspecified: Secondary | ICD-10-CM

## 2020-01-31 DIAGNOSIS — R7303 Prediabetes: Secondary | ICD-10-CM | POA: Diagnosis not present

## 2020-02-02 NOTE — Progress Notes (Signed)
Chief Complaint:   OBESITY Casi is here to discuss her progress with her obesity treatment plan along with follow-up of her obesity related diagnoses. Tekila is on the Category 2 Plan and states she is following her eating plan approximately 80% of the time. Ian states she is exercising for 0 minutes 0 times per week.  Today's visit was #: 3 Starting weight: 239 lbs Starting date: 01/04/2020 Today's weight: 234 lbs Today's date: 01/31/2020 Total lbs lost to date: 5 lbs Total lbs lost since last in-office visit: 2 lbs  Interim History: Koryn says she is tired of the bread - same thing over and over.  She says that 20% of the time, she just does not eat or finish all her food.  She is not weighing her proteins.  She has done much better than prior, though, since she used to only eat once per day.  Subjective:   1. Prediabetes Aariya has a diagnosis of prediabetes based on her elevated HgA1c and was informed this puts her at greater risk of developing diabetes. She continues to work on diet and exercise to decrease her risk of diabetes. She denies nausea or hypoglycemia.  Reminded her of the importance of healthy meal plan.  Denies symptoms or concerns.  Lab Results  Component Value Date   HGBA1C 5.7 (H) 01/04/2020   Lab Results  Component Value Date   INSULIN 8.0 01/04/2020   2. Iron deficiency anemia, unspecified iron deficiency anemia type Esta is not a vegetarian.  She does not have a history of weight loss surgery.  On iron infusions through GI and/or PCP.  She needs to restart them and contacted her doctor to have them restarted.  CBC Latest Ref Rng & Units 01/04/2020 12/05/2019 08/25/2019  WBC 3.4 - 10.8 x10E3/uL 4.6 4.7 6.8  Hemoglobin 11.1 - 15.9 g/dL 11.0(L) 10.4(L) 11.2(L)  Hematocrit 34.0 - 46.6 % 35.8 33.3(L) 35.5(L)  Platelets 150 - 450 x10E3/uL 345 318 375   Lab Results  Component Value Date   IRON 38 (L) 11/11/2019   TIBC 315 06/17/2019   TIBC 346  06/17/2019   FERRITIN 34.7 11/11/2019   Lab Results  Component Value Date   VITAMINB12 1,679 (H) 01/04/2020   3. Vitamin D deficiency, unspecified [place]'s Vitamin D level was 75.2 on 01/04/2020. She is currently taking prescription vitamin D 50,000 IU twice weekly. She denies nausea, vomiting or muscle weakness.  Assessment/Plan:   1. Prediabetes Taila will continue to work on weight loss, exercise, and decreasing simple carbohydrates to help decrease the risk of diabetes.  Decrease simple carbs, increase protein in diet, weight loss.  Recheck labs in 3 months.  2. Iron deficiency anemia, unspecified iron deficiency anemia type She will call Heme/Onc to restart iron infusions she was on prior through GI or PCP's office.  Orders and follow up as documented in patient record.  Counseling  Iron is essential for our bodies to make red blood cells.  Reasons that someone may be deficient include: an iron-deficient diet (more likely in those following vegan or vegetarian diets), women with heavy menses, patients with GI disorders or poor absorption, patients that have had bariatric surgery, frequent blood donors, patients with cancer, and patients with heart disease.    Iron-rich foods include dark leafy greens, red and white meats, eggs, seafood, and beans.    Certain foods and drinks prevent your body from absorbing iron properly. Avoid eating these foods in the same meal as iron-rich foods  or with iron supplements. These foods include: coffee, black tea, and red wine; milk, dairy products, and foods that are high in calcium; beans and soybeans; whole grains.   Constipation can be a side effect of iron supplementation. Increased water and fiber intake are helpful. Water goal: > 2 liters/day. Fiber goal: > 25 grams/day.  3. Vitamin D deficiency, unspecified Low Vitamin D level contributes to fatigue and are associated with obesity, breast, and colon cancer. She agrees to continue to take  prescription Vitamin D @50 ,000 IU twice weekly and will follow-up for routine testing of Vitamin D, at least 2-3 times per year to avoid over-replacement.  Will monitor in winter as well as treatment plan is concerned.  Continue weight loss.  4. Class 3 severe obesity with serious comorbidity and body mass index (BMI) of 40.0 to 44.9 in adult, unspecified obesity type (HCC)  Cayley is currently in the action stage of change. As such, her goal is to continue with weight loss efforts. She has agreed to the Category 2 Plan with breakfast options.  Also gave her alternatives for bread such as Lavish Bread, Joseph's Wraps, and Aldi Light and Active, etc.  Exercise goals: As is.  Behavioral modification strategies: increasing lean protein intake, increasing vegetables, no skipping meals, meal planning and cooking strategies, keeping healthy foods in the home and planning for success.    Anadalay has agreed to follow-up with our clinic in 2 weeks. She was informed of the importance of frequent follow-up visits to maximize her success with intensive lifestyle modifications for her multiple health conditions.   Objective:   Blood pressure 102/69, pulse 69, temperature 97.9 F (36.6 C), height 5\' 4"  (1.626 m), weight 234 lb 9.6 oz (106.4 kg), last menstrual period 12/27/2003, SpO2 99 %. Body mass index is 40.27 kg/m.  General: Cooperative, alert, well developed, in no acute distress. HEENT: Conjunctivae and lids unremarkable. Cardiovascular: Regular rhythm.  Lungs: Normal work of breathing. Neurologic: No focal deficits.   Lab Results  Component Value Date   CREATININE 0.79 01/04/2020   BUN 11 01/04/2020   NA 137 01/04/2020   K 4.2 01/04/2020   CL 99 01/04/2020   CO2 26 01/04/2020   Lab Results  Component Value Date   ALT 19 01/04/2020   AST 20 01/04/2020   ALKPHOS 74 01/04/2020   BILITOT 0.3 01/04/2020   Lab Results  Component Value Date   HGBA1C 5.7 (H) 01/04/2020   HGBA1C 5.7 (H)  06/17/2019   HGBA1C 5.1 12/24/2017   HGBA1C 5.1 06/16/2017   Lab Results  Component Value Date   INSULIN 8.0 01/04/2020   Lab Results  Component Value Date   TSH 0.872 01/04/2020   Lab Results  Component Value Date   CHOL 223 (H) 01/04/2020   HDL 62 01/04/2020   LDLCALC 147 (H) 01/04/2020   TRIG 80 01/04/2020   CHOLHDL 2.9 06/17/2019   Lab Results  Component Value Date   WBC 4.6 01/04/2020   HGB 11.0 (L) 01/04/2020   HCT 35.8 01/04/2020   MCV 77 (L) 01/04/2020   PLT 345 01/04/2020   Lab Results  Component Value Date   IRON 38 (L) 11/11/2019   TIBC 315 06/17/2019   TIBC 346 06/17/2019   FERRITIN 34.7 11/11/2019   Attestation Statements:   Reviewed by clinician on day of visit: allergies, medications, problem list, medical history, surgical history, family history, social history, and previous encounter notes.  Time spent on visit including pre-visit chart review and  post-visit care and charting was 26 minutes.   I, Insurance claims handler, CMA, am acting as Energy manager for [employer], DO.  I have reviewed the above documentation for accuracy and completeness, and I agree with the above. Thomasene Lot, DO

## 2020-02-08 NOTE — Progress Notes (Signed)
Office Visit Note  Patient: [name]             Date of Birth: 1975-12-06           MRN: [id number]             PCP: [name], FNP Referring: [name], FNP Visit Date: 02/18/2020 Occupation: @GUAROCC @  Subjective:  Myalgia positive ANA.   History of Present Illness: [name] is a 44 y.o. female seen in consultation per request of her PCP.  According the patient in 2018 she started experiencing numbness on the right side of her body which gradually moved to the left side of her body.  She states she was having also headaches and facial numbness.  She was having episodic increased shortness of breath.  She states she was seen by her PCP and was given prednisone several times.  One time she was returning from 2019 and had severe breathing difficulty and was seen at the [facility].  She had 2 cortisone injections and started feeling better.  She was seen by pulmonologist and had a thorough work-up and was diagnosed with asthma.  She was advised to use inhalers on as needed basis.  She was also referred to the neurologist.  She states she had extensive work-up including EMG and nerve conduction velocities which were normal.  She was seeing an ophthalmologist due to right eye pain and drooping.  No etiology was established.  She states she continues to have episodic weakness muscle pain and increased sensitivity to touch on her face.  She notices swelling on her hands and her ankles at times.  She has been also having right shoulder joint pain for which she has been under care of Dr. [facility] and Dr.Xu.  She had arthroscopic surgery on her right shoulder without much results.  She seen dentist for the right TMJ pain and no etiology was established.  She gives history of sicca symptoms.  There is no history of oral ulcers, nasal ulcers, malar rash, Raynaud's phenomenon, photosensitivity or lymphadenopathy.  Activities of Daily Living:  Patient reports morning stiffness  for 20  minutes.   Patient Reports nocturnal pain.  Difficulty dressing/grooming: Denies Difficulty climbing stairs: Denies Difficulty getting out of chair: Denies Difficulty using hands for taps, buttons, cutlery, and/or writing: Reports  Review of Systems  Constitutional: Positive for fatigue.  HENT: Positive for mouth dryness and nose dryness. Negative for mouth sores.   Eyes: Positive for dryness. Negative for pain and itching.  Respiratory: Positive for shortness of breath, wheezing and difficulty breathing.   Cardiovascular: Negative for chest pain and palpitations.  Gastrointestinal: Negative for blood in stool, constipation and diarrhea.  Endocrine: Negative for increased urination.  Genitourinary: Negative for difficulty urinating and painful urination.  Musculoskeletal: Positive for arthralgias, joint pain, joint swelling, myalgias, morning stiffness, muscle tenderness and myalgias.  Skin: Negative for color change, rash and redness.  Allergic/Immunologic: Positive for susceptible to infections.  Neurological: Positive for dizziness, numbness and weakness. Negative for headaches and memory loss.  Hematological: Positive for bruising/bleeding tendency.  Psychiatric/Behavioral: Negative for depressed mood and confusion. The patient is not nervous/anxious.     PMFS History:  Patient Active Problem List   Diagnosis Date Noted  . Vitamin D deficiency, unspecified 01/26/2020  . Hyperlipidemia 01/26/2020  . Prediabetes 01/26/2020  . At risk for heart disease 01/26/2020  . Daytime somnolence 01/26/2020  . Vitamin D deficiency 01/04/2020  . Fatty liver 01/04/2020  . B12  deficiency 01/04/2020  . Moderate persistent asthma without complication 12/31/2019  . Inappropriate sinus tachycardia 12/08/2019  . Class 3 severe obesity with serious comorbidity and body mass index (BMI) of 40.0 to 44.9 in adult (HCC) 12/08/2019  . Apnea 12/08/2019  . Anaphylactic reaction due to food,  subsequent encounter 11/22/2019  . Allergic rhinoconjunctivitis 11/22/2019  . Idiopathic urticaria 11/22/2019  . Generalized anxiety disorder 04/08/2018  . Atypical chest pain   . Regurgitation of food   . Laryngopharyngeal reflux (LPR) 07/30/2017  . Throat soreness 07/30/2017  . ANA positive 12/30/2016  . Gastroesophageal reflux disease 06/27/2016  . Disturbance of skin sensation 04/27/2014  . Dizziness and giddiness 03/14/2014  . Headache, migraine 03/14/2014  . Myalgia and myositis, unspecified 06/07/2013  . Nausea and vomiting in adult 08/22/2012  . Viral syndrome 08/22/2012  . Pelvic pain in female 12/18/2010  . PID (acute pelvic inflammatory disease) 12/18/2010  . ANEMIA-IRON DEFICIENCY 10/15/2006  . HERPES, GENITAL NOS 09/30/2006  . DISORDER, NONORGANIC SLEEP NOS 09/30/2006  . CONSTIPATION NOS 09/30/2006  . NEURITIS, LUMBOSACRAL NOS 09/30/2006  . HX, PERSONAL, MENTAL DISORDER NOS 09/30/2006    Past Medical History:  Diagnosis Date  . Allergy   . Anemia   . Angio-edema   . Anxiety   . Apnea 12/08/2019  . Arthritis   . Asthma   . Back pain   . Constipation   . Fatty liver   . GERD (gastroesophageal reflux disease)   . Hx of endometriosis   . Inappropriate sinus tachycardia 12/08/2019  . Insomnia   . Joint pain   . Lactose intolerance   . Macromastia 07/2011  . Migraine   . Morbid obesity (HCC) 12/08/2019  . Osteoporosis   . Urticaria   . Vitamin D deficiency     Family History  Problem Relation Age of Onset  . Diabetes Mother   . Hypertension Mother   . Hyperlipidemia Mother   . Sleep apnea Mother   . Thyroid disease Sister   . Dementia Father   . Heart attack Maternal Grandmother   . Stroke Maternal Grandfather   . Asthma Son   . Healthy Son   . Asthma Daughter   . Healthy Daughter   . Colon cancer Neg Hx   . Colon polyps Neg Hx   . Esophageal cancer Neg Hx   . Rectal cancer Neg Hx   . Stomach cancer Neg Hx    Past Surgical History:  Procedure  Laterality Date  . 24 HOUR PH STUDY N/A 01/12/2018   Procedure: 24 HOUR PH STUDY;  Surgeon: Napoleon Form, MD;  Location: [facility];  Service: Endoscopy;  Laterality: N/A;  . ABDOMINAL HYSTERECTOMY  12/12/2003  . ADENOIDECTOMY  05/2011  . ANKLE ARTHROSCOPY  12/29/2007   right; with extensive debridement  . BLADDER NECK RECONSTRUCTION  01/14/2011   Procedure: BLADDER NECK REPAIR;  Surgeon: Reva Bores, MD;  Location: [facility];  Service: Gynecology;  Laterality: N/A;  Laparoscopic Repair of Incidental Cystotomy  . BREAST REDUCTION SURGERY  08/05/2011   Procedure: MAMMARY REDUCTION  (BREAST);  Surgeon: Louisa Second, MD;  Location: [facility];  Service: Plastics;  Laterality: Bilateral;  bilateral  . BREAST SURGERY  2012   breast reduction  . CESAREAN SECTION  12/29/2000; 1994  . DILATION AND CURETTAGE OF UTERUS  07/08/2003   open laparoscopy  . ESOPHAGEAL MANOMETRY N/A 01/12/2018   Procedure: ESOPHAGEAL MANOMETRY (EM);  Surgeon: Napoleon Form, MD;  Location: [facility];  Service: Endoscopy;  Laterality: N/A;  . GIVENS CAPSULE STUDY N/A 06/23/2017   Procedure: GIVENS CAPSULE STUDY;  Surgeon: Benancio DeedsArmbruster, Steven P, MD;  [facility];  Service: Gastroenterology;  Laterality: N/A;  . LAPAROSCOPIC SALPINGOOPHERECTOMY  01/14/2011   left  . PH IMPEDANCE STUDY N/A 01/12/2018   Procedure: PH IMPEDANCE STUDY;  Surgeon: Napoleon FormNandigam, Kavitha V, MD;  Location: [facility];  Service: Endoscopy;  Laterality: N/A;  . REPAIR PERONEAL TENDONS ANKLE  01/03/2010   repair right subluxing peroneal tendons  . RESECTION DISTAL CLAVICAL Right 09/25/2017   Procedure: RESECTION DISTAL CLAVICAL;  Surgeon: Bjorn PippinVarkey, Dax T, MD;  Location: [facility];  Service: Orthopedics;  Laterality: Right;  . RIGHT OOPHORECTOMY     with lysis of adhesions  . SHOULDER ACROMIOPLASTY Right 09/25/2017   Procedure: SHOULDER ACROMIOPLASTY;  Surgeon: Bjorn PippinVarkey, Dax T, MD;  Location: [facility];  Service: Orthopedics;  Laterality: Right;  . SHOULDER ARTHROSCOPY WITH DEBRIDEMENT AND BICEP TENDON REPAIR Right 09/25/2017   Procedure: SHOULDER ARTHROSCOPY WITH DEBRIDEMENT AND BICEP TENDON REPAIR;  Surgeon: Bjorn PippinVarkey, Dax T, MD;  Location: [facility];  Service: Orthopedics;  Laterality: Right;  . SHOULDER ARTHROSCOPY WITH ROTATOR CUFF REPAIR Right 09/25/2017   Procedure: SHOULDER ARTHROSCOPY WITH ROTATOR CUFF REPAIR;  Surgeon: Bjorn PippinVarkey, Dax T, MD;  Location:  SURGERY CENTER;  Service: Orthopedics;  Laterality: Right;  . TONSILLECTOMY  as a child  . TUBAL LIGATION  12/29/2000  . UPPER GASTROINTESTINAL ENDOSCOPY     Social History   Social History Narrative   Pt lives in 2 story town home with her son   Has 2 children   High school graduate   Bus driver for [employer]   Right handed   Immunization History  Administered Date(s) Administered  . Influenza,inj,Quad PF,6+ Mos 02/15/2016, 12/24/2017, 02/24/2019  . Td 05/06/1994  . Tdap 12/06/2015     Objective: Vital Signs: BP 121/85 (BP Location: Right Arm, Patient Position: Sitting, Cuff Size: Normal)   Pulse 71   Resp 16   Ht 5' 4.5" (1.638 m)   Wt 235 lb (106.6 kg)   LMP 12/27/2003   BMI 39.71 kg/m    Physical Exam Vitals and nursing note reviewed.  Constitutional:      Appearance: She is well-developed.  HENT:     Head: Normocephalic and atraumatic.  Eyes:     Conjunctiva/sclera: Conjunctivae normal.  Cardiovascular:     Rate and Rhythm: Normal rate and regular rhythm.     Heart sounds: Normal heart sounds.  Pulmonary:     Effort: Pulmonary effort is normal.     Breath sounds: Normal breath sounds.  Abdominal:     General: Bowel sounds are normal.     Palpations: Abdomen is soft.  Musculoskeletal:     Cervical back: Normal range of motion.  Lymphadenopathy:     Cervical: No cervical adenopathy.  Skin:    General: Skin is warm and dry.     Capillary Refill: Capillary refill  takes less than 2 seconds.  Neurological:     Mental Status: She is alert and oriented to person, place, and time.  Psychiatric:        Behavior: Behavior normal.      Musculoskeletal Exam: C-spine thoracic and lumbar spine with good range of motion.  She has discomfort range of motion for lumbar spine.  Shoulder joints, elbow joints, wrist joints, MCPs PIPs and DIPs with good range of motion with no synovitis.  She has some discomfort range of motion of her right shoulder joint.  Hip joints, knee joints, ankles, MTPs and PIPs with good range of motion with no synovitis.  CDAI Exam: CDAI Score: -- Patient Global: --; Provider Global: -- Swollen: --; Tender: -- Joint Exam 02/18/2020   No joint exam has been documented for this visit   There is currently no information documented on the homunculus. Go to the Rheumatology activity and complete the homunculus joint exam.  Investigation: No additional findings.  Imaging: XR Foot 2 Views Left  Result Date: 02/18/2020 First MTP, PIP and DIP narrowing was noted.  No intertarsal, or tibiotalar joint space narrowing was noted.  Posterior calcaneal spur was noted. Impression: These findings are consistent with osteoarthritis of the foot.  XR Foot 2 Views Right  Result Date: 02/18/2020 First MTP, PIP and DIP narrowing was noted.  No intertarsal, or tibiotalar joint space narrowing was noted.  Posterior calcaneal spur was noted. Impression: These findings are consistent with osteoarthritis of the foot.  XR Hand 2 View Left  Result Date: 02/18/2020 No MCP, PIP or DIP narrowing was noted.  No intercarpal or radiocarpal joint space narrowing was noted.  No erosive changes were noted. Impression: Unremarkable x-ray of the hand.  XR Hand 2 View Right  Result Date: 02/18/2020 No MCP, PIP or DIP narrowing was noted.  No intercarpal or radiocarpal joint space narrowing was noted.  No erosive changes were noted. Impression: Unremarkable x-ray of  the hand.  SLEEP STUDY DOCUMENTS  Result Date: 01/28/2020 Ordered by an unspecified provider.   Recent Labs: Lab Results  Component Value Date   WBC 6.5 02/16/2020   HGB 10.7 (L) 02/16/2020   PLT 278.0 02/16/2020   NA 137 01/04/2020   K 4.2 01/04/2020   CL 99 01/04/2020   CO2 26 01/04/2020   GLUCOSE 94 01/04/2020   BUN 11 01/04/2020   CREATININE 0.79 01/04/2020   BILITOT 0.3 01/04/2020   ALKPHOS 74 01/04/2020   AST 20 01/04/2020   ALT 19 01/04/2020   PROT 7.4 01/04/2020   ALBUMIN 4.5 01/04/2020   CALCIUM 9.3 01/04/2020   GFRAA 105 01/04/2020    Speciality Comments: No specialty comments available.  Procedures:  No procedures performed Allergies: Aspirin, Ibuprofen, and Salmon [fish allergy]   Assessment / Plan:     Visit Diagnoses: Positive ANA (antinuclear antibody) - 10/05/19: ANA 1:320H, 1:320 speckled -she has positive ANA.  No clinical features of autoimmune disease were noted.  She gives history of sicca symptoms.  There is no history of oral ulcers, nasal sores, no rash, photosensitivity or Raynaud's phenomenon.  She gives history of intermittent swelling in her hands and feet.  I do not see any synovitis on examination today.  Plan: CK, ANA, Anti-scleroderma antibody, RNP Antibody, Anti-Smith antibody, Sjogrens syndrome-B extractable nuclear antibody, Anti-DNA antibody, double-stranded, Sjogrens syndrome-A extractable nuclear antibody, C3 and C4, Beta-2 glycoprotein antibodies, Cardiolipin antibodies, IgG, IgM, IgA  Pain in both hands -she complains of pain and discomfort in her bilateral hands.  No synovitis was noted.  Plan: XR Hand 2 View Right, XR Hand 2 View Left, x-ray of bilateral hands were unremarkable.  Sedimentation rate, Rheumatoid factor, Cyclic citrul peptide antibody, IgG  Pain in both feet -she complains of pain and discomfort in her bilateral feet.  No swelling or tenderness was noted.  Plan: XR Foot 2 Views Right, XR Foot 2 Views Left.  X-rays are  consistent with osteoarthritis.  Myalgia -she complains of increased  muscle pain.  Plan: CK  Idiopathic urticaria-she gives history of intermittent urticaria.  Moderate persistent asthma without complication-per patient she was diagnosed with pulmonologist and uses an inhaler.  Other medical problems are listed as follows:  Laryngopharyngeal reflux (LPR)  Fatty liver  Prediabetes  History of hyperlipidemia  Generalized anxiety disorder  Allergic rhinoconjunctivitis  Vitamin D deficiency  B12 deficiency  History of PID  History of herpes genitalis  Educated about COVID-19 virus infection-I detailed discussion with the patient to get the COVID-19 vaccination.  Risk of not getting immunization was discussed.  Orders: Orders Placed This Encounter  Procedures  . XR Foot 2 Views Right  . XR Foot 2 Views Left  . XR Hand 2 View Right  . XR Hand 2 View Left  . CK  . Sedimentation rate  . Rheumatoid factor  . Cyclic citrul peptide antibody, IgG  . ANA  . Anti-scleroderma antibody  . RNP Antibody  . Anti-Smith antibody  . Sjogrens syndrome-B extractable nuclear antibody  . Anti-DNA antibody, double-stranded  . Sjogrens syndrome-A extractable nuclear antibody  . C3 and C4  . Beta-2 glycoprotein antibodies  . Cardiolipin antibodies, IgG, IgM, IgA   No orders of the defined types were placed in this encounter.     Follow-Up Instructions: Return for myalgia, positive ANA.   Pollyann Savoy, MD  Note - This record has been created using Animal nutritionist.  Chart creation errors have been sought, but may not always  have been located. Such creation errors do not reflect on  the standard of medical care.

## 2020-02-10 ENCOUNTER — Other Ambulatory Visit: Payer: Self-pay

## 2020-02-10 DIAGNOSIS — K219 Gastro-esophageal reflux disease without esophagitis: Secondary | ICD-10-CM

## 2020-02-10 MED ORDER — SUCRALFATE 1 GM/10ML PO SUSP: 1.0000 g | Freq: Four times a day (QID) | ORAL | 1 refills | Status: DC | PRN

## 2020-02-14 ENCOUNTER — Encounter (HOSPITAL_COMMUNITY): Payer: Self-pay | Admitting: Emergency Medicine

## 2020-02-14 ENCOUNTER — Other Ambulatory Visit: Payer: Self-pay

## 2020-02-14 ENCOUNTER — Ambulatory Visit (HOSPITAL_COMMUNITY)
Admission: EM | Admit: 2020-02-14 | Discharge: 2020-02-14 | Disposition: A | Discharge status: Home / Self Care | Payer: 59 | Attending: Internal Medicine | Admitting: Internal Medicine

## 2020-02-14 DIAGNOSIS — M1990 Unspecified osteoarthritis, unspecified site: Secondary | ICD-10-CM | POA: Insufficient documentation

## 2020-02-14 DIAGNOSIS — Z20822 Contact with and (suspected) exposure to covid-19: Secondary | ICD-10-CM | POA: Insufficient documentation

## 2020-02-14 DIAGNOSIS — E559 Vitamin D deficiency, unspecified: Secondary | ICD-10-CM | POA: Insufficient documentation

## 2020-02-14 DIAGNOSIS — Z6841 Body Mass Index (BMI) 40.0 and over, adult: Secondary | ICD-10-CM | POA: Diagnosis not present

## 2020-02-14 DIAGNOSIS — R7303 Prediabetes: Secondary | ICD-10-CM | POA: Diagnosis not present

## 2020-02-14 DIAGNOSIS — J029 Acute pharyngitis, unspecified: Secondary | ICD-10-CM | POA: Diagnosis present

## 2020-02-14 DIAGNOSIS — G43909 Migraine, unspecified, not intractable, without status migrainosus: Secondary | ICD-10-CM | POA: Diagnosis not present

## 2020-02-14 DIAGNOSIS — J4540 Moderate persistent asthma, uncomplicated: Secondary | ICD-10-CM | POA: Diagnosis not present

## 2020-02-14 DIAGNOSIS — E785 Hyperlipidemia, unspecified: Secondary | ICD-10-CM | POA: Insufficient documentation

## 2020-02-14 DIAGNOSIS — K760 Fatty (change of) liver, not elsewhere classified: Secondary | ICD-10-CM | POA: Insufficient documentation

## 2020-02-14 DIAGNOSIS — Z79899 Other long term (current) drug therapy: Secondary | ICD-10-CM | POA: Insufficient documentation

## 2020-02-14 DIAGNOSIS — K219 Gastro-esophageal reflux disease without esophagitis: Secondary | ICD-10-CM | POA: Insufficient documentation

## 2020-02-14 DIAGNOSIS — D649 Anemia, unspecified: Secondary | ICD-10-CM | POA: Insufficient documentation

## 2020-02-14 MED ORDER — SUCRALFATE 1 G PO TABS: 1.0000 g | ORAL_TABLET | Freq: Four times a day (QID) | ORAL | 2 refills | Status: DC

## 2020-02-14 NOTE — ED Triage Notes (Signed)
Pt c/o headache, sore throat onset 3-4 days. Pt denies fever. She has been taking Tylenol with no relief of symptoms.

## 2020-02-14 NOTE — Discharge Instructions (Addendum)
Use warm saltwater gargle Please quarantine until covid-19 test results are available.

## 2020-02-14 NOTE — Progress Notes (Signed)
Insurance does not cover carafate suspension. Sent tablets with instructions to make a slurry instead.

## 2020-02-15 LAB — SARS CORONAVIRUS 2 (TAT 6-24 HRS): SARS Coronavirus 2: NEGATIVE

## 2020-02-16 ENCOUNTER — Ambulatory Visit (INDEPENDENT_AMBULATORY_CARE_PROVIDER_SITE_OTHER): Payer: 59 | Admitting: Family Medicine

## 2020-02-16 ENCOUNTER — Other Ambulatory Visit: Payer: Self-pay

## 2020-02-16 ENCOUNTER — Telehealth: Payer: Self-pay

## 2020-02-16 ENCOUNTER — Other Ambulatory Visit (INDEPENDENT_AMBULATORY_CARE_PROVIDER_SITE_OTHER): Payer: 59

## 2020-02-16 VITALS — BP 120/83 | HR 68 | Temp 97.6°F | Ht 64.0 in | Wt 228.0 lb

## 2020-02-16 DIAGNOSIS — E559 Vitamin D deficiency, unspecified: Secondary | ICD-10-CM

## 2020-02-16 DIAGNOSIS — D509 Iron deficiency anemia, unspecified: Secondary | ICD-10-CM | POA: Diagnosis not present

## 2020-02-16 LAB — CBC
HCT: 33.5 % — ABNORMAL LOW (ref 36.0–46.0)
Hemoglobin: 10.7 g/dL — ABNORMAL LOW (ref 12.0–15.0)
MCHC: 31.9 g/dL (ref 30.0–36.0)
MCV: 75.6 fl — ABNORMAL LOW (ref 78.0–100.0)
Platelets: 278 10*3/uL (ref 150.0–400.0)
RBC: 4.43 Mil/uL (ref 3.87–5.11)
RDW: 15.7 % — ABNORMAL HIGH (ref 11.5–15.5)
WBC: 6.5 10*3/uL (ref 4.0–10.5)

## 2020-02-16 LAB — IBC + FERRITIN
Ferritin: 53.5 ng/mL (ref 10.0–291.0)
Iron: 58 ug/dL (ref 42–145)
Saturation Ratios: 16.1 % — ABNORMAL LOW (ref 20.0–50.0)
Transferrin: 257 mg/dL (ref 212.0–360.0)

## 2020-02-16 MED ORDER — VITAMIN D (ERGOCALCIFEROL) 1.25 MG (50000 UNIT) PO CAPS: ORAL_CAPSULE | ORAL | 0 refills | Status: DC

## 2020-02-16 NOTE — ED Provider Notes (Signed)
[facility]    CSN: [id number] Arrival date & time: 02/14/20  1912      History   Chief Complaint Chief Complaint  Patient presents with  . Sore Throat  . Headache    HPI [name] is a 44 y.o. female comes to the urgent care with complaints of headache, sore throat of 3 to 4 days duration.  Symptoms started insidiously and is gotten worse.  Patient has a cough which is nonproductive.  No shortness of breath, chest tightness or chest pain.  No nausea, vomiting or diarrhea.  No fever or chills.[name]   HPI  Past Medical History:  Diagnosis Date  . Allergy   . Anemia   . Angio-edema   . Anxiety   . Apnea 12/08/2019  . Arthritis   . Asthma   . Back pain   . Constipation   . Fatty liver   . GERD (gastroesophageal reflux disease)   . Hx of endometriosis   . Inappropriate sinus tachycardia 12/08/2019  . Insomnia   . Joint pain   . Lactose intolerance   . Macromastia 07/2011  . Migraine   . Morbid obesity (HCC) 12/08/2019  . Osteoporosis   . Urticaria   . Vitamin D deficiency     Patient Active Problem List   Diagnosis Date Noted  . Vitamin D deficiency, unspecified 01/26/2020  . Hyperlipidemia 01/26/2020  . Prediabetes 01/26/2020  . At risk for heart disease 01/26/2020  . Daytime somnolence 01/26/2020  . Vitamin D deficiency 01/04/2020  . Fatty liver 01/04/2020  . B12 deficiency 01/04/2020  . Moderate persistent asthma without complication 12/31/2019  . Inappropriate sinus tachycardia 12/08/2019  . Class 3 severe obesity with serious comorbidity and body mass index (BMI) of 40.0 to 44.9 in adult (HCC) 12/08/2019  . Apnea 12/08/2019  . Anaphylactic reaction due to food, subsequent encounter 11/22/2019  . Allergic rhinoconjunctivitis 11/22/2019  . Idiopathic urticaria 11/22/2019  . Generalized anxiety disorder 04/08/2018  . Atypical chest pain   . Regurgitation of food   . Laryngopharyngeal reflux (LPR) 07/30/2017  . Throat soreness 07/30/2017  .  ANA positive 12/30/2016  . Gastroesophageal reflux disease 06/27/2016  . Disturbance of skin sensation 04/27/2014  . Dizziness and giddiness 03/14/2014  . Headache, migraine 03/14/2014  . Myalgia and myositis, unspecified 06/07/2013  . Nausea and vomiting in adult 08/22/2012  . Viral syndrome 08/22/2012  . Pelvic pain in female 12/18/2010  . PID (acute pelvic inflammatory disease) 12/18/2010  . ANEMIA-IRON DEFICIENCY 10/15/2006  . HERPES, GENITAL NOS 09/30/2006  . DISORDER, NONORGANIC SLEEP NOS 09/30/2006  . CONSTIPATION NOS 09/30/2006  . NEURITIS, LUMBOSACRAL NOS 09/30/2006  . HX, PERSONAL, MENTAL DISORDER NOS 09/30/2006    Past Surgical History:  Procedure Laterality Date  . 24 HOUR PH STUDY N/A 01/12/2018   Procedure: 24 HOUR PH STUDY;  Surgeon: Napoleon Form, MD;  Location: [facility];  Service: Endoscopy;  Laterality: N/A;  . ABDOMINAL HYSTERECTOMY  12/12/2003  . ADENOIDECTOMY  05/2011  . ANKLE ARTHROSCOPY  12/29/2007   right; with extensive debridement  . BLADDER NECK RECONSTRUCTION  01/14/2011   Procedure: BLADDER NECK REPAIR;  Surgeon: Reva Bores, MD;  Location: [facility];  Service: Gynecology;  Laterality: N/A;  Laparoscopic Repair of Incidental Cystotomy  . BREAST REDUCTION SURGERY  08/05/2011   Procedure: MAMMARY REDUCTION  (BREAST);  Surgeon: Louisa Second, MD;  Location: [facility];  Service: Plastics;  Laterality: Bilateral;  bilateral  . CESAREAN SECTION  12/29/2000; 1994  . DILATION AND CURETTAGE OF UTERUS  07/08/2003   open laparoscopy  . ESOPHAGEAL MANOMETRY N/A 01/12/2018   Procedure: ESOPHAGEAL MANOMETRY (EM);  Surgeon: Napoleon FormNandigam, Kavitha V, MD;  Location: [facility];  Service: Endoscopy;  Laterality: N/A;  . GIVENS CAPSULE STUDY N/A 06/23/2017   Procedure: GIVENS CAPSULE STUDY;  Surgeon: Benancio DeedsArmbruster, Steven P, MD;  [facility];  Service: Gastroenterology;  Laterality: N/A;  . LAPAROSCOPIC SALPINGOOPHERECTOMY  01/14/2011   left  . PH  IMPEDANCE STUDY N/A 01/12/2018   Procedure: PH IMPEDANCE STUDY;  Surgeon: Napoleon FormNandigam, Kavitha V, MD;  Location: [facility];  Service: Endoscopy;  Laterality: N/A;  . REPAIR PERONEAL TENDONS ANKLE  01/03/2010   repair right subluxing peroneal tendons  . RESECTION DISTAL CLAVICAL Right 09/25/2017   Procedure: RESECTION DISTAL CLAVICAL;  Surgeon: Bjorn PippinVarkey, Dax T, MD;  Location: [facility];  Service: Orthopedics;  Laterality: Right;  . RIGHT OOPHORECTOMY     with lysis of adhesions  . SHOULDER ACROMIOPLASTY Right 09/25/2017   Procedure: SHOULDER ACROMIOPLASTY;  Surgeon: Bjorn PippinVarkey, Dax T, MD;  Location: [facility];  Service: Orthopedics;  Laterality: Right;  . SHOULDER ARTHROSCOPY WITH DEBRIDEMENT AND BICEP TENDON REPAIR Right 09/25/2017   Procedure: SHOULDER ARTHROSCOPY WITH DEBRIDEMENT AND BICEP TENDON REPAIR;  Surgeon: Bjorn PippinVarkey, Dax T, MD;  Location: [facility];  Service: Orthopedics;  Laterality: Right;  . SHOULDER ARTHROSCOPY WITH ROTATOR CUFF REPAIR Right 09/25/2017   Procedure: SHOULDER ARTHROSCOPY WITH ROTATOR CUFF REPAIR;  Surgeon: Bjorn PippinVarkey, Dax T, MD;  Location: [facility];  Service: Orthopedics;  Laterality: Right;  . TONSILLECTOMY  as a child  . TUBAL LIGATION  12/29/2000  . UPPER GASTROINTESTINAL ENDOSCOPY      OB History    Gravida  2   Para  2   Term  2   Preterm      AB      Living  2     SAB      TAB      Ectopic      Multiple      Live Births               Home Medications    Prior to Admission medications   Medication Sig Start Date End Date Taking? Authorizing Provider  albuterol (VENTOLIN HFA) 108 (90 Base) MCG/ACT inhaler Inhale 2 puffs into the lungs every 6 (six) hours as needed for wheezing or shortness of breath. 01/25/20  Yes Charlott Holleresai, Nikita S, MD  cetirizine (ZYRTEC) 10 MG tablet Take 1 tablet (10 mg total) by mouth daily as needed for rhinitis. 11/22/19  Yes Ambs, Norvel RichardsAnne M, FNP  esomeprazole (NEXIUM)  40 MG capsule Take 1 capsule (40 mg total) by mouth daily at 12 noon. 10/05/19  Yes Armbruster, Willaim RayasSteven P, MD  fluticasone furoate-vilanterol (BREO ELLIPTA) 100-25 MCG/INH AEPB Inhale 1 puff into the lungs daily. 09/24/19  Yes Charlott Holleresai, Nikita S, MD  RESTASIS 0.05 % ophthalmic emulsion 1 drop 2 (two) times daily. 11/04/19  Yes [provider]  sucralfate (CARAFATE) 1 g tablet Take 1 tablet (1 g total) by mouth every 6 (six) hours. Slowly dissolve tablet in 1 Tablespoon of distilled water before ingestion 02/14/20  Yes Armbruster, Willaim RayasSteven P, MD  zolpidem (AMBIEN CR) 12.5 MG CR tablet Take 12.5 mg by mouth at bedtime as needed. 09/13/19  Yes [provider]  EPINEPHrine (EPIPEN 2-PAK) 0.3 mg/0.3 mL IJ SOAJ injection Inject 0.3 mLs (0.3 mg total) into  the muscle as needed for anaphylaxis. 12/31/19   Hetty Blend, FNP  famotidine (PEPCID) 20 MG tablet Take 20 mg by mouth daily. 01/22/20   [provider]  Vitamin D, Ergocalciferol, (DRISDOL) 1.25 MG (50000 UNIT) CAPS capsule 1 tab q wed and 1 po qd sun 02/16/20   Thomasene Lot, DO  ferrous sulfate 325 (65 FE) MG EC tablet Take 1 tablet (325 mg total) by mouth 2 (two) times daily with a meal. 02/05/17 08/25/19  Armbruster, Willaim Rayas, MD  Fluticasone-Salmeterol (ADVAIR DISKUS) 250-50 MCG/DOSE AEPB Advair Diskus 250 mcg-50 mcg/dose powder for inhalation  10/06/19  [provider]  loratadine (CLARITIN) 10 MG tablet Take 1 tablet (10 mg total) by mouth daily. 12/24/17 01/05/20  Kallie Locks, FNP    Family History Family History  Problem Relation Age of Onset  . Diabetes Mother   . Hypertension Mother   . Hyperlipidemia Mother   . Sleep apnea Mother   . Thyroid disease Sister   . Heart attack Maternal Grandmother   . Stroke Maternal Grandfather   . Asthma Son   . Asthma Daughter   . Colon cancer Neg Hx   . Colon polyps Neg Hx   . Esophageal cancer Neg Hx   . Rectal cancer Neg Hx   . Stomach cancer Neg Hx     Social  History Social History   Tobacco Use  . Smoking status: Never Smoker  . Smokeless tobacco: Never Used  Vaping Use  . Vaping Use: Never used  Substance Use Topics  . Alcohol use: Yes    Comment: occasional   . Drug use: No     Allergies   Aspirin, Ibuprofen, and Salmon [fish allergy]   Review of Systems Review of Systems as per HPI.   Physical Exam Triage Vital Signs ED Triage Vitals  Enc Vitals Group     BP --      Pulse --      Resp --      Temp 02/14/20 2043 98.4 F (36.9 C)     Temp Source 02/14/20 2043 Oral     SpO2 --      Weight --      Height --      Head Circumference --      Peak Flow --      Pain Score 02/14/20 2127 6     Pain Loc --      Pain Edu? --      Excl. in GC? --    No data found.  Updated Vital Signs Temp 98.4 F (36.9 C) (Oral)   LMP 12/27/2003   Visual Acuity Right Eye Distance:   Left Eye Distance:   Bilateral Distance:    Right Eye Near:   Left Eye Near:    Bilateral Near:     Physical Exam Vitals and nursing note reviewed.  Constitutional:      General: She is not in acute distress.    Appearance: She is well-developed. She is not ill-appearing.  HENT:     Right Ear: Tympanic membrane normal.     Left Ear: Tympanic membrane normal.     Mouth/Throat:     Mouth: Mucous membranes are moist. Mucous membranes are pale.     Pharynx: No oropharyngeal exudate or posterior oropharyngeal erythema.     Tonsils: No tonsillar exudate or tonsillar abscesses. 0 on the right. 0 on the left.  Neurological:     Mental Status: She is alert.  UC Treatments / Results  Labs (all labs ordered are listed, but only abnormal results are displayed) Labs Reviewed  SARS CORONAVIRUS 2 (TAT 6-24 HRS)    EKG   Radiology No results found.  Procedures Procedures (including critical care time)  Medications Ordered in UC Medications - No data to display  Initial Impression / Assessment and Plan / UC Course  I have reviewed the  triage vital signs and the nursing notes.  Pertinent labs & imaging results that were available during my care of the patient were reviewed by me and considered in my medical decision making (see chart for details).     1.  Acute viral pharyngitis: COVID-19 PCR has been sent Warm salt water gargle Cepacol lozenges as needed Chloraseptic spray Return precautions given. Final Clinical Impressions(s) / UC Diagnoses   Final diagnoses:  Acute pharyngitis, unspecified etiology     Discharge Instructions     Use warm saltwater gargle Please quarantine until covid-19 test results are available.   ED Prescriptions    None     PDMP not reviewed this encounter.   Merrilee Jansky, MD 02/16/20 [phone]

## 2020-02-16 NOTE — Telephone Encounter (Signed)
Spoke with patient to remind her that she is due for repeat labs at this time. Advised that she can go by at her convenience between 7:30 AM - 5 PM, she is aware that we are closed this Friday. Pt verbalized understanding and has no concerns at this time.

## 2020-02-16 NOTE — Telephone Encounter (Signed)
-----   Message from Annett Fabian, RN sent at 02/16/2020  9:11 AM EDT ----- Regarding: FW: Lab  ----- Message ----- From: Missy Sabins, RN Sent: 02/16/2020 To: Annett Fabian, RN Subject: Lab                                            Repeat CBC, IBC+Ferritin, order in epic

## 2020-02-17 NOTE — Progress Notes (Signed)
Chief Complaint:   OBESITY [name] is here to discuss her progress with her obesity treatment plan along with follow-up of her obesity related diagnoses. [name] is on the Category 2 Plan and states she is following her eating plan approximately 80% of the time. [name] states she is exercising for 0 minutes 0 times per week.  Today's visit was #: 4 Starting weight: 239 lbs Starting date: 01/04/2020 Today's weight: 228 lbs Today's date: 02/16/2020 Total lbs lost to date: 11 lbs Total lbs lost since last in-office visit: 6 lbs  Interim History: [name] says she has been eating more salads than sandwiches for the past 2 weeks.  She is still weighing/measuring.  She also drank more water (6-7 bottles per day).  She is feeling better and more in control.  She feels she has been more strict than over prior weeks.  Assessment/Plan:   1. Vitamin D deficiency, unspecified [name] has a history of Vitamin D deficiency with resultant generalized fatigue as her primary symptom.  she is taking vitamin D 50,000 IU weekly for this deficiency and tolerating it well without side-effect.   Most recent Vitamin D lab reviewed-  level: 75.2.  Plan:   - Discussed importance of vitamin D (as well as calcium) to their health and well-being.   - We reviewed possible symptoms of low Vitamin D including low energy, depressed mood, muscle aches, joint aches, osteoporosis etc.  - We discussed that low Vitamin D levels may be linked to an increased risk of cardiovascular events and even increased risk of cancers- such as colon and breast.   - Educated pt that weight loss will likely improve availability of vitamin D, thus encouraged [name] to continue with meal plan and their weight loss efforts to further improve this condition  - I recommend pt take a weekly prescription vit D- see script below- which pt agrees to after discussion of risks and benefits of this medication.      - Informed patient  this may be a lifelong thing, and she was encouraged to continue to take the medicine until pt told otherwise.   We will need to monitor levels regularly ( q 3-4 mo on average )  to keep levels within normal limits.   - All pt's questions and concerns regarding this condition addressed  -Refill Vitamin D, Ergocalciferol, (DRISDOL) 1.25 MG (50000 UNIT) CAPS capsule; 1 tab q wed and 1 po qd sun  Dispense: 8 capsule; Refill: 0  2. Class 2 severe obesity with serious comorbidity and body mass index (BMI) of 39.0 to 39.9 in adult, unspecified obesity type (HCC)  [name] is currently in the action stage of change. As such, her goal is to continue with weight loss efforts. She has agreed to the Category 2 Plan.   Exercise goals: All adults should avoid inactivity. Some physical activity is better than none, and adults who participate in any amount of physical activity gain some health benefits. Her goal is to walk for 15 to 20 minutes 3 days per week.  Behavioral modification strategies: increasing lean protein intake, decreasing simple carbohydrates, keeping healthy foods in the home, travel eating strategies and planning for success.  [name] has agreed to follow-up with our clinic in 2 weeks. She was informed of the importance of frequent follow-up visits to maximize her success with intensive lifestyle modifications for her multiple health conditions.   Objective:   Blood pressure 120/83, pulse 68, temperature 97.6 F (36.4 C), height  5\' 4"  (1.626 m), weight 228 lb (103.4 kg), last menstrual period 12/27/2003, SpO2 99 %. Body mass index is 39.14 kg/m.  General: Cooperative, alert, well developed, in no acute distress. HEENT: Conjunctivae and lids unremarkable. Cardiovascular: Regular rhythm.  Lungs: Normal work of breathing. Neurologic: No focal deficits.   Lab Results  Component Value Date   CREATININE 0.79 01/04/2020   BUN 11 01/04/2020   NA 137 01/04/2020   K 4.2 01/04/2020   CL 99  01/04/2020   CO2 26 01/04/2020   Lab Results  Component Value Date   ALT 19 01/04/2020   AST 20 01/04/2020   ALKPHOS 74 01/04/2020   BILITOT 0.3 01/04/2020   Lab Results  Component Value Date   HGBA1C 5.7 (H) 01/04/2020   HGBA1C 5.7 (H) 06/17/2019   HGBA1C 5.1 12/24/2017   HGBA1C 5.1 06/16/2017   Lab Results  Component Value Date   INSULIN 8.0 01/04/2020   Lab Results  Component Value Date   TSH 0.872 01/04/2020   Lab Results  Component Value Date   CHOL 223 (H) 01/04/2020   HDL 62 01/04/2020   LDLCALC 147 (H) 01/04/2020   TRIG 80 01/04/2020   CHOLHDL 2.9 06/17/2019   Lab Results  Component Value Date   WBC 6.5 02/16/2020   HGB 10.7 (L) 02/16/2020   HCT 33.5 (L) 02/16/2020   MCV 75.6 (L) 02/16/2020   PLT 278.0 02/16/2020   Lab Results  Component Value Date   IRON 58 02/16/2020   TIBC 315 06/17/2019   TIBC 346 06/17/2019   FERRITIN 53.5 02/16/2020   Attestation Statements:   Reviewed by clinician on day of visit: allergies, medications, problem list, medical history, surgical history, family history, social history, and previous encounter notes.  I, 02/18/2020, CMA, am acting as Insurance claims handler for Energy manager, DO.  I have reviewed the above documentation for accuracy and completeness, and I agree with the above. [employer], D.O.  The 21st Century Cures Act was signed into law in 2016 which includes the topic of electronic health records.  This provides immediate access to information in MyChart.  This includes consultation notes, operative notes, office notes, lab results and pathology reports.  If you have any questions about what you read please let 2017 know at your next visit so we can discuss your concerns and take corrective action if need be.  We are right here with you.

## 2020-02-18 ENCOUNTER — Other Ambulatory Visit: Payer: Self-pay

## 2020-02-18 ENCOUNTER — Ambulatory Visit: Payer: Self-pay

## 2020-02-18 ENCOUNTER — Encounter: Payer: Self-pay | Admitting: Rheumatology

## 2020-02-18 ENCOUNTER — Ambulatory Visit (INDEPENDENT_AMBULATORY_CARE_PROVIDER_SITE_OTHER): Payer: 59 | Admitting: Rheumatology

## 2020-02-18 VITALS — BP 121/85 | HR 71 | Resp 16 | Ht 64.5 in | Wt 235.0 lb

## 2020-02-18 DIAGNOSIS — M79641 Pain in right hand: Secondary | ICD-10-CM

## 2020-02-18 DIAGNOSIS — M79642 Pain in left hand: Secondary | ICD-10-CM

## 2020-02-18 DIAGNOSIS — R768 Other specified abnormal immunological findings in serum: Secondary | ICD-10-CM

## 2020-02-18 DIAGNOSIS — Z8742 Personal history of other diseases of the female genital tract: Secondary | ICD-10-CM

## 2020-02-18 DIAGNOSIS — M79671 Pain in right foot: Secondary | ICD-10-CM

## 2020-02-18 DIAGNOSIS — L501 Idiopathic urticaria: Secondary | ICD-10-CM

## 2020-02-18 DIAGNOSIS — J4540 Moderate persistent asthma, uncomplicated: Secondary | ICD-10-CM

## 2020-02-18 DIAGNOSIS — Z7189 Other specified counseling: Secondary | ICD-10-CM

## 2020-02-18 DIAGNOSIS — Z8639 Personal history of other endocrine, nutritional and metabolic disease: Secondary | ICD-10-CM

## 2020-02-18 DIAGNOSIS — E559 Vitamin D deficiency, unspecified: Secondary | ICD-10-CM

## 2020-02-18 DIAGNOSIS — H1010 Acute atopic conjunctivitis, unspecified eye: Secondary | ICD-10-CM

## 2020-02-18 DIAGNOSIS — M79672 Pain in left foot: Secondary | ICD-10-CM | POA: Diagnosis not present

## 2020-02-18 DIAGNOSIS — F411 Generalized anxiety disorder: Secondary | ICD-10-CM

## 2020-02-18 DIAGNOSIS — K760 Fatty (change of) liver, not elsewhere classified: Secondary | ICD-10-CM

## 2020-02-18 DIAGNOSIS — K219 Gastro-esophageal reflux disease without esophagitis: Secondary | ICD-10-CM

## 2020-02-18 DIAGNOSIS — Z8619 Personal history of other infectious and parasitic diseases: Secondary | ICD-10-CM

## 2020-02-18 DIAGNOSIS — R7303 Prediabetes: Secondary | ICD-10-CM

## 2020-02-18 DIAGNOSIS — M791 Myalgia, unspecified site: Secondary | ICD-10-CM | POA: Diagnosis not present

## 2020-02-18 DIAGNOSIS — E538 Deficiency of other specified B group vitamins: Secondary | ICD-10-CM

## 2020-02-18 DIAGNOSIS — J309 Allergic rhinitis, unspecified: Secondary | ICD-10-CM

## 2020-02-20 ENCOUNTER — Encounter (HOSPITAL_BASED_OUTPATIENT_CLINIC_OR_DEPARTMENT_OTHER): Payer: Self-pay | Admitting: Cardiovascular Disease

## 2020-02-20 NOTE — Procedures (Signed)
Patient Name: [name], [name] Date: 01/26/2020 Gender: Female D.O.B: 26-Jul-1975 Age (years): 44 Referring Provider: Chilton Si Height (inches): 64 Interpreting Physician: Nicki Guadalajara MD, ABSM Weight (lbs): 240 RPSGT: Shelah Lewandowsky BMI: 41 MRN: [id number] Neck Size: 14.50  CLINICAL INFORMATION Sleep Study Type: NPSG  Indication for sleep study: Excessive Daytime Sleepiness, Fatigue, Obesity  Epworth Sleepiness Score: 0  Most recent polysomnogram dated 08/05/2014 revealed an AHI of 0/h and RDI of 0.2/h.  SLEEP STUDY TECHNIQUE As per the AASM Manual for the Scoring of Sleep and Associated Events v2.3 (April 2016) with a hypopnea requiring 4% desaturations.  The channels recorded and monitored were frontal, central and occipital EEG, electrooculogram (EOG), submentalis EMG (chin), nasal and oral airflow, thoracic and abdominal wall motion, anterior tibialis EMG, snore microphone, electrocardiogram, and pulse oximetry.  MEDICATIONS albuterol (VENTOLIN HFA) 108 (90 Base) MCG/ACT inhaler cetirizine (ZYRTEC) 10 MG tablet EPINEPHrine (EPIPEN 2-PAK) 0.3 mg/0.3 mL IJ SOAJ injection esomeprazole (NEXIUM) 40 MG capsule famotidine (PEPCID) 20 MG tablet fluticasone furoate-vilanterol (BREO ELLIPTA) 100-25 MCG/INH AEPB RESTASIS 0.05 % ophthalmic emulsion sucralfate (CARAFATE) 1 g tablet Vitamin D, Ergocalciferol, (DRISDOL) 1.25 MG (50000 UNIT) CAPS capsule zolpidem (AMBIEN CR) 12.5 MG CR tablet Medications self-administered by patient taken the night of the study : AMBIEN  SLEEP ARCHITECTURE The study was initiated at 10:57:34 PM and ended at 4:59:46 AM.  Sleep onset time was 4.3 minutes and the sleep efficiency was 93.2%%. The total sleep time was 337.5 minutes.  Stage REM latency was 108.0 minutes.  The patient spent 6.7%% of the night in stage N1 sleep, 74.8%% in stage N2 sleep, 0.0%% in stage N3 and 18.5% in REM.  Alpha intrusion was absent.  Supine sleep  was 48.00%.  RESPIRATORY PARAMETERS The overall apnea/hypopnea index (AHI) was 3.2 per hour. The respiratory disturbance index (RDI) was 4.4/h. There were 13 total apneas, including 4 obstructive, 9 central and 0 mixed apneas. There were 5 hypopneas and 7 RERAs.  The AHI during Stage REM sleep was 17.3 per hour.  AHI while supine was 0.7 per hour.  The mean oxygen saturation was 95.7%. The minimum SpO2 during sleep was 83.0%.  Moderate snoring was noted during this study.  CARDIAC DATA The 2 lead EKG demonstrated sinus rhythm. The mean heart rate was 70.3 beats per minute. Other EKG findings include: None.  LEG MOVEMENT DATA The total PLMS were 0 with a resulting PLMS index of 0.0. Associated arousal with leg movement index was 0.0 .  IMPRESSIONS - No significant obstructive sleep apnea overall (AHI 3.2/h; RDI 4.4/h); however, there was moderate sleep apnea during REM sleep (AHI 17.3/h). - No significant central sleep apnea occurred during this study (CAI = 1.6/h). - Moderate oxygen desaturation to a nadir of 83.0% during REM sleep. - The patient snored with moderate snoring volume. - No cardiac abnormalities were noted during this study. - Clinically significant periodic limb movements did not occur during sleep. No significant associated arousals.  DIAGNOSIS - Sleep apnea, unspecified (G47.30) - Nocturnal Hypoxemia (G47.36)  RECOMMENDATIONS - At pesent patient does not meet criteria for CPAP. Consider re-evaluation in the future if symptoms progress. - Effort should be made to optimize nasal and oropharyngeal patency. - Consider alternatives for the treatment of snoring. - Avoid alcohol, sedatives and other CNS depressants that may worsen sleep apnea and disrupt normal sleep architecture. - Sleep hygiene should be reviewed to assess factors that may improve sleep quality. - Weight management (BMI 41) and regular exercise should  be initiated or continued if  appropriate.  [Electronically signed] 02/20/2020 09:40 PM  Nicki Guadalajara MD, [facility], ABSM Diplomate, American Board of Sleep Medicine   NPI: [id number] [facility] PH: [phone]   FX: [phone] ACCREDITED BY THE AMERICAN ACADEMY OF SLEEP MEDICINE

## 2020-02-21 ENCOUNTER — Telehealth: Payer: Self-pay | Admitting: Internal Medicine

## 2020-02-21 NOTE — Telephone Encounter (Signed)
Called and spoke to patient to gather additional information. Patient stated that she was on bus and requested to call back in .  Will await call back.

## 2020-02-21 NOTE — Telephone Encounter (Signed)
That is fine for in person visit   Please contact office for sooner follow up if symptoms do not improve or worsen or seek emergency care

## 2020-02-21 NOTE — Telephone Encounter (Signed)
appt scheduled for 02/22/2020 at 10:30 with Beth. Patient is aware and voiced her understanding.  Nothing further needed.

## 2020-02-21 NOTE — Telephone Encounter (Signed)
Patient is returning phone call. Patient phone number is [phone].

## 2020-02-21 NOTE — Telephone Encounter (Signed)
Spoke with the pt  She states having increased DOE for the past month  She gets winded after walking her own pace after approx 2 min  She states not coughing but she does wheezing  She is not having any CP, f/c/s, body aches  She is taking her Breo daily and using her albuterol about 2 x per wk  Pt requesting appt  Please advise if we can make her an in person appt  Thanks!

## 2020-02-22 ENCOUNTER — Encounter: Payer: Self-pay | Admitting: Primary Care

## 2020-02-22 ENCOUNTER — Other Ambulatory Visit: Payer: Self-pay

## 2020-02-22 ENCOUNTER — Ambulatory Visit (INDEPENDENT_AMBULATORY_CARE_PROVIDER_SITE_OTHER): Payer: 59 | Admitting: Primary Care

## 2020-02-22 DIAGNOSIS — J4540 Moderate persistent asthma, uncomplicated: Secondary | ICD-10-CM

## 2020-02-22 LAB — BETA-2 GLYCOPROTEIN ANTIBODIES
Beta-2 Glyco 1 IgA: <2 U/mL
Beta-2 Glyco 1 IgM: <2 U/mL
Beta-2 Glyco I IgG: <2 U/mL

## 2020-02-22 LAB — CARDIOLIPIN ANTIBODIES, IGG, IGM, IGA
Anticardiolipin IgA: <2 APL-U/mL
Anticardiolipin IgG: <2 GPL-U/mL
Anticardiolipin IgM: <2 MPL-U/mL

## 2020-02-22 LAB — SEDIMENTATION RATE: Sed Rate: 19 mm/h (ref 0–20)

## 2020-02-22 LAB — ANTI-SCLERODERMA ANTIBODY: Scleroderma (Scl-70) (ENA) Antibody, IgG: <1 AI

## 2020-02-22 LAB — SJOGRENS SYNDROME-B EXTRACTABLE NUCLEAR ANTIBODY: SSB (La) (ENA) Antibody, IgG: <1 AI

## 2020-02-22 LAB — ANTI-SMITH ANTIBODY: ENA SM Ab Ser-aCnc: <1 AI

## 2020-02-22 LAB — RNP ANTIBODY: Ribonucleic Protein(ENA) Antibody, IgG: <1 AI

## 2020-02-22 LAB — C3 AND C4
C3 Complement: 152 mg/dL (ref 83–193)
C4 Complement: 36 mg/dL (ref 15–57)

## 2020-02-22 LAB — SJOGRENS SYNDROME-A EXTRACTABLE NUCLEAR ANTIBODY: SSA (Ro) (ENA) Antibody, IgG: <1 AI

## 2020-02-22 LAB — ANTI-NUCLEAR AB-TITER (ANA TITER): ANA Titer 1: 1:160 {titer} — ABNORMAL HIGH

## 2020-02-22 LAB — RHEUMATOID FACTOR: Rheumatoid fact SerPl-aCnc: <14 IU/mL (ref <14)

## 2020-02-22 LAB — CK: Total CK: 180 U/L — ABNORMAL HIGH (ref 29–143)

## 2020-02-22 LAB — ANTI-DNA ANTIBODY, DOUBLE-STRANDED: ds DNA Ab: 1 IU/mL

## 2020-02-22 LAB — ANA: Anti Nuclear Antibody (ANA): POSITIVE — AB

## 2020-02-22 LAB — CYCLIC CITRUL PEPTIDE ANTIBODY, IGG: Cyclic Citrullin Peptide Ab: <16 UNITS

## 2020-02-22 MED ORDER — PREDNISONE 10 MG PO TABS: ORAL_TABLET | ORAL | 0 refills | Status: DC

## 2020-02-22 NOTE — Progress Notes (Signed)
I will discuss results at the follow-up visit.

## 2020-02-22 NOTE — Patient Instructions (Addendum)
Recommendations: Continue Breo 100 one puff daily (rinse mouth after use) Use albuterol 2 puff every 6 hours as needed for breakthrough shortness of breath/wheezing  Rx: Prednisone taper (40mg  x 3 days; 30mg  x 3 days; 20mg  x 3 days; 10mg  x 3 days)  Follow-up: If not better after prednisone please let know ; otherwise you have a regular follow-up due in April with Dr. (recall already in)

## 2020-02-22 NOTE — Progress Notes (Signed)
@Patient  ID: , female    DOB: [birth year], 44 y.o.   MRN: [id number]  Chief Complaint  Patient presents with   Follow-up    Pt has had complaints of increased SOB and wheezing which began about 2 weeks ago. Pt has had to use her rescue inhaler every day but states when she uses it, it makes her dizzy and also makes her heart race.    Referring provider: [id number], FNP  HPI: 44 year old female, never smoked. PMH significant for moderate persistent asthma, LPR, GERD. Patient of Dr. 59, last seen on 01/25/20. Maintained on BREO 100, prn albuterol, flonase and oral antihistamine.   02/22/2020- Acute visit  Patient presents today for acute visit with reports of increased shortness of breath x 2 weeks. Associated wheezing, PND and nasal congestion. She is compliant with Breo 100 one puff daily. Uses albuterol twice a week on average, states that it makes her dizziness and HR race. She does not notice a significant improvement with SABA. She is using Zyrtec and flonase. She is working on weight loss. She gets tested for Covid weekly at work, negative on 02/16/20. She is due for another COVID test tomorrow 02/23/20. She has gained some weight and is not currently physically active. She is working on weight loss with diet. CT calcium score was 0. She has an apt with rheumatology for 03/06/20 for positive ANA.    Pulmonary function testing: 03/12/17 - PFTs showed no airflow limitation but there was a significant response to bronchodilator challenge. 12/2019- Spirometry shows normal spirometry.   Labs: IgE 33, environmental IgE panel negative (immunocap) in 2019.   Allergies  Allergen Reactions   Aspirin    Ibuprofen    Salmon [Fish Allergy]     Immunization History  Administered Date(s) Administered   Influenza,inj,Quad PF,6+ Mos 02/15/2016, 12/24/2017, 02/24/2019   Td 05/06/1994   Tdap 12/06/2015    Past Medical History:  Diagnosis Date   Allergy     Anemia    Angio-edema    Anxiety    Apnea 12/08/2019   Arthritis    Asthma    Back pain    Constipation    Fatty liver    GERD (gastroesophageal reflux disease)    Hx of endometriosis    Inappropriate sinus tachycardia 12/08/2019   Insomnia    Joint pain    Lactose intolerance    Macromastia 07/2011   Migraine    Morbid obesity (HCC) 12/08/2019   Osteoporosis    Urticaria    Vitamin D deficiency     Tobacco History: Social History   Tobacco Use  Smoking Status Never Smoker  Smokeless Tobacco Never Used   Counseling given: Not Answered   Outpatient Medications Prior to Visit  Medication Sig Dispense Refill   albuterol (VENTOLIN HFA) 108 (90 Base) MCG/ACT inhaler Inhale 2 puffs into the lungs every 6 (six) hours as needed for wheezing or shortness of breath. 1 each 11   cetirizine (ZYRTEC) 10 MG tablet Take 1 tablet (10 mg total) by mouth daily as needed for rhinitis. 30 tablet 5   EPINEPHrine (EPIPEN 2-PAK) 0.3 mg/0.3 mL IJ SOAJ injection Inject 0.3 mLs (0.3 mg total) into the muscle as needed for anaphylaxis. 2 each 2   esomeprazole (NEXIUM) 40 MG capsule Take 1 capsule (40 mg total) by mouth daily at 12 noon. 30 capsule 6   famotidine (PEPCID) 20 MG tablet Take 20 mg by mouth daily.     fluticasone  furoate-vilanterol (BREO ELLIPTA) 100-25 MCG/INH AEPB Inhale 1 puff into the lungs daily. 1 each 11   RESTASIS 0.05 % ophthalmic emulsion 1 drop 2 (two) times daily.     sucralfate (CARAFATE) 1 g tablet Take 1 tablet (1 g total) by mouth every 6 (six) hours. Slowly dissolve tablet in 1 Tablespoon of distilled water before ingestion 90 tablet 2   Vitamin D, Ergocalciferol, (DRISDOL) 1.25 MG (50000 UNIT) CAPS capsule 1 tab q wed and 1 po qd sun 8 capsule 0   zolpidem (AMBIEN CR) 12.5 MG CR tablet Take 12.5 mg by mouth at bedtime as needed.     No facility-administered medications prior to visit.    Review of Systems  Review of Systems    Constitutional: Negative.   HENT: Positive for postnasal drip.   Respiratory: Positive for shortness of breath and wheezing. Negative for apnea, cough and choking.   Cardiovascular: Negative.    Physical Exam  BP 120/70 (BP Location: Left Arm, Cuff Size: Large)    Pulse 66    Temp 97.6 F (36.4 C) (Oral)    Ht 5' 4.5" (1.638 m)    Wt 231 lb 9.6 oz (105.1 kg)    LMP 12/27/2003    SpO2 100%    BMI 39.14 kg/m  Physical Exam Constitutional:      General: She is not in acute distress.    Appearance: Normal appearance. She is not ill-appearing.  HENT:     Head: Normocephalic and atraumatic.  Cardiovascular:     Rate and Rhythm: Normal rate and regular rhythm.  Pulmonary:     Effort: Pulmonary effort is normal.     Breath sounds: Normal breath sounds. No wheezing.     Comments: CTA Musculoskeletal:        General: Normal range of motion.  Skin:    General: Skin is warm and dry.  Neurological:     General: No focal deficit present.     Mental Status: She is alert and oriented to person, place, and time. Mental status is at baseline.  Psychiatric:        Mood and Affect: Mood normal.        Behavior: Behavior normal.        Thought Content: Thought content normal.        Judgment: Judgment normal.      Lab Results:  CBC    Component Value Date/Time   WBC 6.5 02/16/2020 1544   RBC 4.43 02/16/2020 1544   HGB 10.7 (L) 02/16/2020 1544   HGB 11.0 (L) 01/04/2020 1046   HCT 33.5 (L) 02/16/2020 1544   HCT 35.8 01/04/2020 1046   PLT 278.0 02/16/2020 1544   PLT 345 01/04/2020 1046   MCV 75.6 (L) 02/16/2020 1544   MCV 77 (L) 01/04/2020 1046   MCH 23.7 (L) 01/04/2020 1046   MCH 24.6 (L) 12/05/2019 0133   MCHC 31.9 02/16/2020 1544   RDW 15.7 (H) 02/16/2020 1544   RDW 13.9 01/04/2020 1046   LYMPHSABS 1.9 01/04/2020 1046   MONOABS 0.5 08/25/2019 1956   EOSABS 0.1 01/04/2020 1046   BASOSABS 0.0 01/04/2020 1046    BMET    Component Value Date/Time   NA 137 01/04/2020 1046    K 4.2 01/04/2020 1046   CL 99 01/04/2020 1046   CO2 26 01/04/2020 1046   GLUCOSE 94 01/04/2020 1046   GLUCOSE 93 12/05/2019 0133   BUN 11 01/04/2020 1046   CREATININE 0.79 01/04/2020 1046   CREATININE  0.77 12/23/2016 1531   CALCIUM 9.3 01/04/2020 1046   GFRNONAA 91 01/04/2020 1046   GFRNONAA >89 12/23/2016 1531   GFRAA 105 01/04/2020 1046   GFRAA >89 12/23/2016 1531    BNP    Component Value Date/Time   BNP 9.9 03/17/2018 1312    ProBNP    Component Value Date/Time   PROBNP 19.6 09/16/2012 0105    Imaging: XR Foot 2 Views Left  Result Date: 02/18/2020 First MTP, PIP and DIP narrowing was noted.  No intertarsal, or tibiotalar joint space narrowing was noted.  Posterior calcaneal spur was noted. Impression: These findings are consistent with osteoarthritis of the foot.  XR Foot 2 Views Right  Result Date: 02/18/2020 First MTP, PIP and DIP narrowing was noted.  No intertarsal, or tibiotalar joint space narrowing was noted.  Posterior calcaneal spur was noted. Impression: These findings are consistent with osteoarthritis of the foot.  XR Hand 2 View Left  Result Date: 02/18/2020 No MCP, PIP or DIP narrowing was noted.  No intercarpal or radiocarpal joint space narrowing was noted.  No erosive changes were noted. Impression: Unremarkable x-ray of the hand.  XR Hand 2 View Right  Result Date: 02/18/2020 No MCP, PIP or DIP narrowing was noted.  No intercarpal or radiocarpal joint space narrowing was noted.  No erosive changes were noted. Impression: Unremarkable x-ray of the hand.  SLEEP STUDY DOCUMENTS  Result Date: 01/28/2020 Ordered by an unspecified provider.    Assessment & Plan:   Moderate persistent asthma without complication - Increased shortness of breath x 2 weeks with associated wheezing. She is compliant with Breo 100, uses Albuterol twice a week with minimal improvement. Lungs clear on exam. She has not been vaccinated for Covid-19. She gets tested  weekly at work which has previously been negative. She has an apt with Rheum for positive ANA in November. Plan prednisone taper for suspected asthma exacerbation, no indication for antibiotics at this time. If no improvement with oral steriods recommend further work up for dyspnea. LPR may be contributing.   Plan: Continue Breo 100 one puff daily; Albuterol 2 puffs every 6 hours prn Continue Zyrtec 10mg  daily and Flonase nasal spray as prescribed  Prednisone taper 40mg  x 3 days; 30mg  x 3 days; 20mg  x 3 days; 10mg  x 3 days  Follow-up if no improvement, otherwise due for regular visit in April 2022 with Dr. , NP 02/22/2020

## 2020-02-22 NOTE — Assessment & Plan Note (Addendum)
-   Increased shortness of breath x 2 weeks with associated wheezing. She is compliant with Breo 100, uses Albuterol twice a week with minimal improvement. Lungs clear on exam. She has not been vaccinated for Covid-19. She gets tested weekly at work which has previously been negative. She has an apt with Rheum for positive ANA in November. Plan prednisone taper for suspected asthma exacerbation, no indication for antibiotics at this time. If no improvement with oral steriods recommend further work up for dyspnea. LPR may be contributing.   Plan: Continue Breo 100 one puff daily; Albuterol 2 puffs every 6 hours prn Continue Zyrtec 10mg  daily and Flonase nasal spray as prescribed  Prednisone taper 40mg  x 3 days; 30mg  x 3 days; 20mg  x 3 days; 10mg  x 3 days  Follow-up if no improvement, otherwise due for regular visit in April 2022 with Dr. 

## 2020-02-23 ENCOUNTER — Encounter: Payer: Self-pay | Admitting: Nurse Practitioner

## 2020-02-25 ENCOUNTER — Telehealth: Payer: Self-pay | Admitting: *Deleted

## 2020-02-25 NOTE — Telephone Encounter (Signed)
Informed patient of sleep study results and patient understanding was verbalized. Patient understands her sleep study showed:  No significant obstructive sleep apnea overall (AHI 3.2/h; RDI 4.4/h); however, there was moderate sleep apnea during REM sleep (AHI 17.3/h). - Moderate oxygen desaturation to a nadir of 83.0% during REM sleep. - The patient snored with moderate snoring volume.  DIAGNOSIS - Sleep apnea, unspecified (G47.30) - Nocturnal Hypoxemia (G47.36)  RECOMMENDATIONS - At pesent patient does not meet criteria for CPAP. Consider re-evaluation in the future if symptoms progress. Left detailed message on voicemail and informed patient to call back with questions or concerns.

## 2020-02-25 NOTE — Telephone Encounter (Signed)
-----   Message from Lennette Bihari, MD sent at 02/20/2020  9:47 PM EDT ----- Coralee North, please notify pt of the results

## 2020-02-26 DIAGNOSIS — M19071 Primary osteoarthritis, right ankle and foot: Secondary | ICD-10-CM | POA: Insufficient documentation

## 2020-02-26 NOTE — Progress Notes (Signed)
Office Visit Note  Patient: [name]             Date of Birth: 05/05/1976           MRN: [id number]             PCP: [name], FNP Referring: [name], FNP Visit Date: 03/06/2020 Occupation: @GUAROCC @  Subjective:  Right facial weakness.   History of Present Illness: [name] is a 44 y.o. female with history of positive ANA, sicca symptoms.  She states she continues to have dry mouth and dry eye symptoms.  She was seen by her ophthalmologist today and was given some eyedrops.  I do not have the diagnosis on her eye situation.  She states she was seen by the neurologist for drooling from her mouth and also right TMJ pain.  No diagnosis has been established yet.  She also has lot of discomfort in her right ear.  She is bothered by the right-sided facial weakness.  I reviewed the chart and found a note from Dr. Dellis Filbert from June 2021.  At the time he mentioned dysphagia and weakness.  I do not see a follow-up visit.  Patient states she has tried leaving messages to get a follow-up appointment.  She recently took prednisone for asthma which helped her breathing symptoms.  Activities of Daily Living:  Patient reports morning stiffness for 15-20 minutes.   Patient Reports nocturnal pain.  Difficulty dressing/grooming: Denies Difficulty climbing stairs: Denies Difficulty getting out of chair: Denies Difficulty using hands for taps, buttons, cutlery, and/or writing: Reports  Review of Systems  Constitutional: Positive for fatigue.  HENT: Positive for loss of taste. Negative for mouth sores, mouth dryness and nose dryness.   Eyes: Positive for pain and dryness.  Respiratory: Negative for shortness of breath and difficulty breathing.   Cardiovascular: Negative for chest pain and palpitations.  Gastrointestinal: Positive for constipation and diarrhea. Negative for blood in stool.  Endocrine: Negative for increased urination.  Genitourinary: Negative for difficulty  urinating.  Musculoskeletal: Positive for arthralgias, joint pain, myalgias, morning stiffness, muscle tenderness and myalgias. Negative for joint swelling.  Skin: Negative for color change, rash and redness.  Allergic/Immunologic: Negative for susceptible to infections.  Neurological: Positive for dizziness, numbness, headaches, parasthesias and weakness. Negative for memory loss.  Hematological: Positive for bruising/bleeding tendency.  Psychiatric/Behavioral: Positive for sleep disturbance. Negative for confusion.    PMFS History:  Patient Active Problem List   Diagnosis Date Noted  . Primary osteoarthritis of both feet 02/26/2020  . Vitamin D deficiency, unspecified 01/26/2020  . Hyperlipidemia 01/26/2020  . Prediabetes 01/26/2020  . At risk for heart disease 01/26/2020  . Daytime somnolence 01/26/2020  . Vitamin D deficiency 01/04/2020  . Fatty liver 01/04/2020  . B12 deficiency 01/04/2020  . Moderate persistent asthma without complication 61/95/0932  . Inappropriate sinus tachycardia 12/08/2019  . Class 3 severe obesity with serious comorbidity and body mass index (BMI) of 40.0 to 44.9 in adult ([place]) 12/08/2019  . Apnea 12/08/2019  . Anaphylactic reaction due to food, subsequent encounter 11/22/2019  . Allergic rhinoconjunctivitis 11/22/2019  . Idiopathic urticaria 11/22/2019  . Generalized anxiety disorder 04/08/2018  . Atypical chest pain   . Regurgitation of food   . Laryngopharyngeal reflux (LPR) 07/30/2017  . Throat soreness 07/30/2017  . ANA positive 12/30/2016  . Gastroesophageal reflux disease 06/27/2016  . Disturbance of skin sensation 04/27/2014  . Dizziness and giddiness 03/14/2014  . Headache, migraine 03/14/2014  . Myalgia  and myositis, unspecified 06/07/2013  . Nausea and vomiting in adult 08/22/2012  . Viral syndrome 08/22/2012  . Pelvic pain in female 12/18/2010  . PID (acute pelvic inflammatory disease) 12/18/2010  . ANEMIA-IRON DEFICIENCY 10/15/2006    . HERPES, GENITAL NOS 09/30/2006  . DISORDER, NONORGANIC SLEEP NOS 09/30/2006  . CONSTIPATION NOS 09/30/2006  . NEURITIS, LUMBOSACRAL NOS 09/30/2006  . HX, PERSONAL, MENTAL DISORDER NOS 09/30/2006    Past Medical History:  Diagnosis Date  . Allergy   . Anemia   . Angio-edema   . Anxiety   . Apnea 12/08/2019  . Arthritis   . Asthma   . Back pain   . Constipation   . Fatty liver   . GERD (gastroesophageal reflux disease)   . Hx of endometriosis   . Inappropriate sinus tachycardia 12/08/2019  . Insomnia   . Joint pain   . Lactose intolerance   . Macromastia 07/2011  . Migraine   . Morbid obesity (HCC) 12/08/2019  . Osteoporosis   . Urticaria   . Vitamin D deficiency     Family History  Problem Relation Age of Onset  . Diabetes Mother   . Hypertension Mother   . Hyperlipidemia Mother   . Sleep apnea Mother   . Thyroid disease Sister   . Dementia Father   . Heart attack Maternal Grandmother   . Stroke Maternal Grandfather   . Asthma Son   . Healthy Son   . Asthma Daughter   . Healthy Daughter   . Colon cancer Neg Hx   . Colon polyps Neg Hx   . Esophageal cancer Neg Hx   . Rectal cancer Neg Hx   . Stomach cancer Neg Hx    Past Surgical History:  Procedure Laterality Date  . 24 HOUR PH STUDY N/A 01/12/2018   Procedure: 24 HOUR PH STUDY;  Surgeon: Napoleon Form, MD;  Location: [facility];  Service: Endoscopy;  Laterality: N/A;  . ABDOMINAL HYSTERECTOMY  12/12/2003  . ADENOIDECTOMY  05/2011  . ANKLE ARTHROSCOPY  12/29/2007   right; with extensive debridement  . BLADDER NECK RECONSTRUCTION  01/14/2011   Procedure: BLADDER NECK REPAIR;  Surgeon: Reva Bores, MD;  Location: [facility];  Service: Gynecology;  Laterality: N/A;  Laparoscopic Repair of Incidental Cystotomy  . BREAST REDUCTION SURGERY  08/05/2011   Procedure: MAMMARY REDUCTION  (BREAST);  Surgeon: Louisa Second, MD;  Location: [facility];  Service: Plastics;  Laterality: Bilateral;  bilateral   . BREAST SURGERY  2012   breast reduction  . CESAREAN SECTION  12/29/2000; 1994  . DILATION AND CURETTAGE OF UTERUS  07/08/2003   open laparoscopy  . ESOPHAGEAL MANOMETRY N/A 01/12/2018   Procedure: ESOPHAGEAL MANOMETRY (EM);  Surgeon: Napoleon Form, MD;  Location: [facility];  Service: Endoscopy;  Laterality: N/A;  . GIVENS CAPSULE STUDY N/A 06/23/2017   Procedure: GIVENS CAPSULE STUDY;  Surgeon: Benancio Deeds, MD;  Location[facility];  Service: Gastroenterology;  Laterality: N/A;  . LAPAROSCOPIC SALPINGOOPHERECTOMY  01/14/2011   left  . PH IMPEDANCE STUDY N/A 01/12/2018   Procedure: PH IMPEDANCE STUDY;  Surgeon: Napoleon Form, MD;  Location: [facility];  Service: Endoscopy;  Laterality: N/A;  . REPAIR PERONEAL TENDONS ANKLE  01/03/2010   repair right subluxing peroneal tendons  . RESECTION DISTAL CLAVICAL Right 09/25/2017   Procedure: RESECTION DISTAL CLAVICAL;  Surgeon: Bjorn Pippin, MD;  Location: [facility];  Service: Orthopedics;  Laterality: Right;  . RIGHT  OOPHORECTOMY     with lysis of adhesions  . SHOULDER ACROMIOPLASTY Right 09/25/2017   Procedure: SHOULDER ACROMIOPLASTY;  Surgeon: Bjorn Pippin, MD;  Location: [facility];  Service: Orthopedics;  Laterality: Right;  . SHOULDER ARTHROSCOPY WITH DEBRIDEMENT AND BICEP TENDON REPAIR Right 09/25/2017   Procedure: SHOULDER ARTHROSCOPY WITH DEBRIDEMENT AND BICEP TENDON REPAIR;  Surgeon: Bjorn Pippin, MD;  Location: [facility];  Service: Orthopedics;  Laterality: Right;  . SHOULDER ARTHROSCOPY WITH ROTATOR CUFF REPAIR Right 09/25/2017   Procedure: SHOULDER ARTHROSCOPY WITH ROTATOR CUFF REPAIR;  Surgeon: Bjorn Pippin, MD;  Location: [facility];  Service: Orthopedics;  Laterality: Right;  . TONSILLECTOMY  as a child  . TUBAL LIGATION  12/29/2000  . UPPER GASTROINTESTINAL ENDOSCOPY     Social History   Social History Narrative   Pt lives in 2 story town home  with her son   Has 2 children   High school graduate   Bus driver for [employer]   Right handed   Immunization History  Administered Date(s) Administered  . Influenza,inj,Quad PF,6+ Mos 02/15/2016, 12/24/2017, 02/24/2019  . Td 05/06/1994  . Tdap 12/06/2015     Objective: Vital Signs: BP 115/77 (BP Location: Left Arm, Patient Position: Sitting, Cuff Size: Normal)   Pulse 64   Resp 14   Ht 5\' 4"  (1.626 m)   Wt 229 lb 9.6 oz (104.1 kg)   LMP 12/27/2003   BMI 39.41 kg/m    Physical Exam Vitals and nursing note reviewed.  Constitutional:      Appearance: She is well-developed.  HENT:     Head: Normocephalic and atraumatic.  Eyes:     Conjunctiva/sclera: Conjunctivae normal.  Cardiovascular:     Rate and Rhythm: Normal rate and regular rhythm.     Heart sounds: Normal heart sounds.  Pulmonary:     Effort: Pulmonary effort is normal.     Breath sounds: Normal breath sounds.  Abdominal:     General: Bowel sounds are normal.     Palpations: Abdomen is soft.  Musculoskeletal:     Cervical back: Normal range of motion.  Lymphadenopathy:     Cervical: No cervical adenopathy.  Skin:    General: Skin is warm and dry.     Capillary Refill: Capillary refill takes less than 2 seconds.  Neurological:     Mental Status: She is alert and oriented to person, place, and time.  Psychiatric:        Behavior: Behavior normal.      Musculoskeletal Exam: C-spine thoracic and lumbar spine with good range of motion.  Shoulder joints, elbow joints, wrist joints, MCPs PIPs and DIPs with good range of motion with no synovitis.  Hip joints, knee joints, ankles, MTPs PIPs and DIPs with good range of motion with no synovitis.  CDAI Exam: CDAI Score: -- Patient Global: --; Provider Global: -- Swollen: --; Tender: -- Joint Exam 03/06/2020   No joint exam has been documented for this visit   There is currently no information documented on the homunculus. Go to the Rheumatology  activity and complete the homunculus joint exam.  Investigation: No additional findings.  Imaging: XR Foot 2 Views Left  Result Date: 02/18/2020 First MTP, PIP and DIP narrowing was noted.  No intertarsal, or tibiotalar joint space narrowing was noted.  Posterior calcaneal spur was noted. Impression: These findings are consistent with osteoarthritis of the foot.  XR Foot 2 Views Right  Result Date: 02/18/2020  First MTP, PIP and DIP narrowing was noted.  No intertarsal, or tibiotalar joint space narrowing was noted.  Posterior calcaneal spur was noted. Impression: These findings are consistent with osteoarthritis of the foot.  XR Hand 2 View Left  Result Date: 02/18/2020 No MCP, PIP or DIP narrowing was noted.  No intercarpal or radiocarpal joint space narrowing was noted.  No erosive changes were noted. Impression: Unremarkable x-ray of the hand.  XR Hand 2 View Right  Result Date: 02/18/2020 No MCP, PIP or DIP narrowing was noted.  No intercarpal or radiocarpal joint space narrowing was noted.  No erosive changes were noted. Impression: Unremarkable x-ray of the hand.   Recent Labs: Lab Results  Component Value Date   WBC 6.5 02/16/2020   HGB 10.7 (L) 02/16/2020   PLT 278.0 02/16/2020   NA 137 01/04/2020   K 4.2 01/04/2020   CL 99 01/04/2020   CO2 26 01/04/2020   GLUCOSE 94 01/04/2020   BUN 11 01/04/2020   CREATININE 0.79 01/04/2020   BILITOT 0.3 01/04/2020   ALKPHOS 74 01/04/2020   AST 20 01/04/2020   ALT 19 01/04/2020   PROT 7.4 01/04/2020   ALBUMIN 4.5 01/04/2020   CALCIUM 9.3 01/04/2020   GFRAA 105 01/04/2020   February 18, 2020 ANA 1: 160 speckled, ENA negative, C3-C4 normal, anticardiolipin negative, beta-2 GP 1 -, RF negative, anti-CCP negative, ESR 19, CK 180 Speciality Comments: No specialty comments available.  Procedures:  No procedures performed Allergies: Aspirin, Ibuprofen, and Salmon [fish allergy]   Assessment / Plan:     Visit Diagnoses:  Positive ANA (antinuclear antibody) - ANA low titer, nonspecific, ENA negative, history of dry mouth and joint pain.  No synovitis was noted.  Patient is only mild symptoms of dry mouth.  She may have possible early Sjogren's.  She was to get a second opinion from [facility] rheumatology.  Primary osteoarthritis of both feet - X-rays obtained at the last visit were consistent with early osteoarthritis.  Proper fitting shoes were discussed.  Facial muscle weakness-patient complains of right-sided facial weakness.  She has right TMJ pain and had extensive work-up which was negative.  She is also seen a neurologist.  I do not see that the work-up was completed at that point.  Patient complains of drooling from the right side of her mouth.  She is interested in getting a second opinion from [facility] neurology.  I will make the referral for her request.  Myalgia - CK is mildly elevated which is not significant.  I will repeat CK in the future.  Idiopathic urticaria - History of intermittent urticaria.  Patient states she has frequent urticaria.  No diagnosis was established.  Moderate persistent asthma without complication-patient has frequent problems with asthma.  She had recent flare and was treated with prednisone taper.  Laryngopharyngeal reflux (LPR)  Prediabetes  History of hyperlipidemia  Fatty liver  Generalized anxiety disorder  Allergic rhinoconjunctivitis  B12 deficiency  Vitamin D deficiency  Other iron deficiency anemia - Patient has appointment with hematology.  Orders: Orders Placed This Encounter  Procedures  . Ambulatory referral to Rheumatology  . Ambulatory referral to Neurology   No orders of the defined types were placed in this encounter.    Follow-Up Instructions: Return in about 3 months (around 06/06/2020) for Positive ANA, right-sided facial weakness.   Bo Merino, MD  Note - This record has been created using Editor, commissioning.  Chart creation errors have  been sought, but may not always  have been located. Such creation errors do not reflect on  the standard of medical care.

## 2020-03-01 ENCOUNTER — Ambulatory Visit (HOSPITAL_COMMUNITY)
Admission: EM | Admit: 2020-03-01 | Discharge: 2020-03-01 | Disposition: A | Discharge status: Home / Self Care | Payer: 59 | Attending: Family Medicine | Admitting: Family Medicine

## 2020-03-01 ENCOUNTER — Encounter (HOSPITAL_COMMUNITY): Payer: Self-pay | Admitting: Emergency Medicine

## 2020-03-01 ENCOUNTER — Other Ambulatory Visit: Payer: Self-pay

## 2020-03-01 DIAGNOSIS — R519 Headache, unspecified: Secondary | ICD-10-CM

## 2020-03-01 MED ORDER — GABAPENTIN 600 MG PO TABS
300.0000 mg | ORAL_TABLET | Freq: Every day | ORAL | 0 refills | Status: DC
Start: 2020-03-01 — End: 2020-03-06

## 2020-03-01 NOTE — ED Triage Notes (Signed)
Pt presents with facial pain, sore throat, and ear pain. States has been on and off for a month. Pt tested negative for COVID on 02/14/20

## 2020-03-01 NOTE — Discharge Instructions (Addendum)
Start you on gabapentin for nerve pain. Start with 300 mg which would be half a tablet at bedtime to see how you feel with this medicine.  You can take it up to 3 times a day Be careful with taking this medication with your Ambien at bedtime it may increase drowsiness or dizziness Call your neurologist for a follow-up appointment

## 2020-03-02 NOTE — ED Provider Notes (Signed)
[place]    CSN: [id number] Arrival date & time: 03/01/20  1145      History   Chief Complaint Chief Complaint  Patient presents with  . Sore Throat  . Facial Pain    HPI LEANOR VORIS is a 44 y.o. female.   Patient is a 44 year old female with past medical history of allergy, anemia, anxiety, apnea, arthritis, asthma, failure, GERD, migraine.  She presents today with significant facial pain, ear pain, neck pain.  This is been off and on worsening over the past month.  Has been seen by multiple doctors include TMJ specialist, primary care and ear nose and throat specialist without any specific diagnosis.  Has been taking multiple over-the-counter medicines without much relief for pain.  Describes the pain is dull aching sharp at times.  No other associated symptoms.     Past Medical History:  Diagnosis Date  . Allergy   . Anemia   . Angio-edema   . Anxiety   . Apnea 12/08/2019  . Arthritis   . Asthma   . Back pain   . Constipation   . Fatty liver   . GERD (gastroesophageal reflux disease)   . Hx of endometriosis   . Inappropriate sinus tachycardia 12/08/2019  . Insomnia   . Joint pain   . Lactose intolerance   . Macromastia 07/2011  . Migraine   . Morbid obesity ([place]) 12/08/2019  . Osteoporosis   . Urticaria   . Vitamin D deficiency     Patient Active Problem List   Diagnosis Date Noted  . Primary osteoarthritis of both feet 02/26/2020  . Vitamin D deficiency, unspecified 01/26/2020  . Hyperlipidemia 01/26/2020  . Prediabetes 01/26/2020  . At risk for heart disease 01/26/2020  . Daytime somnolence 01/26/2020  . Vitamin D deficiency 01/04/2020  . Fatty liver 01/04/2020  . B12 deficiency 01/04/2020  . Moderate persistent asthma without complication 99/24/2683  . Inappropriate sinus tachycardia 12/08/2019  . Class 3 severe obesity with serious comorbidity and body mass index (BMI) of 40.0 to 44.9 in adult ([place]) 12/08/2019  . Apnea 12/08/2019    . Anaphylactic reaction due to food, subsequent encounter 11/22/2019  . Allergic rhinoconjunctivitis 11/22/2019  . Idiopathic urticaria 11/22/2019  . Generalized anxiety disorder 04/08/2018  . Atypical chest pain   . Regurgitation of food   . Laryngopharyngeal reflux (LPR) 07/30/2017  . Throat soreness 07/30/2017  . ANA positive 12/30/2016  . Gastroesophageal reflux disease 06/27/2016  . Disturbance of skin sensation 04/27/2014  . Dizziness and giddiness 03/14/2014  . Headache, migraine 03/14/2014  . Myalgia and myositis, unspecified 06/07/2013  . Nausea and vomiting in adult 08/22/2012  . Viral syndrome 08/22/2012  . Pelvic pain in female 12/18/2010  . PID (acute pelvic inflammatory disease) 12/18/2010  . ANEMIA-IRON DEFICIENCY 10/15/2006  . HERPES, GENITAL NOS 09/30/2006  . DISORDER, NONORGANIC SLEEP NOS 09/30/2006  . CONSTIPATION NOS 09/30/2006  . NEURITIS, LUMBOSACRAL NOS 09/30/2006  . HX, PERSONAL, MENTAL DISORDER NOS 09/30/2006    Past Surgical History:  Procedure Laterality Date  . [place] STUDY N/A 01/12/2018   Procedure: [place] STUDY;  Surgeon: Mauri Pole, MD;  Location: [facility];  Service: Endoscopy;  Laterality: N/A;  . ABDOMINAL HYSTERECTOMY  12/12/2003  . ADENOIDECTOMY  05/2011  . ANKLE ARTHROSCOPY  12/29/2007   right; with extensive debridement  . BLADDER NECK RECONSTRUCTION  01/14/2011   Procedure: BLADDER NECK REPAIR;  Surgeon: Donnamae Jude, MD;  Location: [facility]  ORS;  Service: Gynecology;  Laterality: N/A;  Laparoscopic Repair of Incidental Cystotomy  . BREAST REDUCTION SURGERY  08/05/2011   Procedure: MAMMARY REDUCTION  (BREAST);  Surgeon: Cristine Polio, MD;  Location: [place];  Service: Plastics;  Laterality: Bilateral;  bilateral  . BREAST SURGERY  2012   breast reduction  . CESAREAN SECTION  12/29/2000; 1994  . DILATION AND CURETTAGE OF UTERUS  07/08/2003   open laparoscopy  . ESOPHAGEAL MANOMETRY N/A 01/12/2018   Procedure:  ESOPHAGEAL MANOMETRY (EM);  Surgeon: Mauri Pole, MD;  Location: [facility];  Service: Endoscopy;  Laterality: N/A;  . [place] CAPSULE STUDY N/A 06/23/2017   Procedure: GIVENS CAPSULE STUDY;  Surgeon: Yetta Flock, MD;  Location: [place];  Service: Gastroenterology;  Laterality: N/A;  . LAPAROSCOPIC SALPINGOOPHERECTOMY  01/14/2011   left  . [place] IMPEDANCE STUDY N/A 01/12/2018   Procedure: [place] IMPEDANCE STUDY;  Surgeon: Mauri Pole, MD;  Location: [facility];  Service: Endoscopy;  Laterality: N/A;  . REPAIR PERONEAL TENDONS ANKLE  01/03/2010   repair right subluxing peroneal tendons  . RESECTION DISTAL CLAVICAL Right 09/25/2017   Procedure: RESECTION DISTAL CLAVICAL;  Surgeon: Hiram Gash, MD;  Location: [facility];  Service: Orthopedics;  Laterality: Right;  . RIGHT OOPHORECTOMY     with lysis of adhesions  . SHOULDER ACROMIOPLASTY Right 09/25/2017   Procedure: SHOULDER ACROMIOPLASTY;  Surgeon: Hiram Gash, MD;  Location: [place];  Service: Orthopedics;  Laterality: Right;  . SHOULDER ARTHROSCOPY WITH DEBRIDEMENT AND BICEP TENDON REPAIR Right 09/25/2017   Procedure: SHOULDER ARTHROSCOPY WITH DEBRIDEMENT AND BICEP TENDON REPAIR;  Surgeon: Hiram Gash, MD;  Location: [place];  Service: Orthopedics;  Laterality: Right;  . SHOULDER ARTHROSCOPY WITH ROTATOR CUFF REPAIR Right 09/25/2017   Procedure: SHOULDER ARTHROSCOPY WITH ROTATOR CUFF REPAIR;  Surgeon: Hiram Gash, MD;  Location: [facility];  Service: Orthopedics;  Laterality: Right;  . TONSILLECTOMY  as a child  . TUBAL LIGATION  12/29/2000  . UPPER GASTROINTESTINAL ENDOSCOPY      OB History    Gravida  2   Para  2   Term  2   Preterm      AB      Living  2     SAB      TAB      Ectopic      Multiple      Live Births               Home Medications    Prior to Admission medications   Medication Sig Start Date End Date  Taking? Authorizing Provider  albuterol (VENTOLIN HFA) 108 (90 Base) MCG/ACT inhaler Inhale 2 puffs into the lungs every 6 (six) hours as needed for wheezing or shortness of breath. 01/25/20   Spero Geralds, MD  cetirizine (ZYRTEC) 10 MG tablet Take 1 tablet (10 mg total) by mouth daily as needed for rhinitis. 11/22/19   Dara Hoyer, FNP  EPINEPHrine (EPIPEN 2-PAK) 0.3 mg/0.3 mL IJ SOAJ injection Inject 0.3 mLs (0.3 mg total) into the muscle as needed for anaphylaxis. 12/31/19   Dara Hoyer, FNP  esomeprazole (NEXIUM) 40 MG capsule Take 1 capsule (40 mg total) by mouth daily at 12 noon. 10/05/19   Armbruster, Carlota Raspberry, MD  famotidine (PEPCID) 20 MG tablet Take 20 mg by mouth daily. 01/22/20   [provider]  fluticasone furoate-vilanterol (BREO ELLIPTA) 100-25 MCG/INH  AEPB Inhale 1 puff into the lungs daily. 09/24/19   Spero Geralds, MD  gabapentin (NEURONTIN) 600 MG tablet Take 0.5 tablets (300 mg total) by mouth at bedtime. Start with 300 mg at bedtime which would be half tab and then can increase slowly to up to 3 times a day once tolerance is met 03/01/20   Loura Halt A, NP  predniSONE (DELTASONE) 10 MG tablet Take 4 tabs po daily x 3 days; then 3 tabs daily x3 days; then 2 tabs daily x3 days; then 1 tab daily x 3 days; then stop 02/22/20   Martyn Ehrich, NP  RESTASIS 0.05 % ophthalmic emulsion 1 drop 2 (two) times daily. 11/04/19   [provider]  sucralfate (CARAFATE) 1 g tablet Take 1 tablet (1 g total) by mouth every 6 (six) hours. Slowly dissolve tablet in 1 Tablespoon of distilled water before ingestion 02/14/20   Armbruster, Carlota Raspberry, MD  Vitamin D, Ergocalciferol, (DRISDOL) 1.25 MG (50000 UNIT) CAPS capsule 1 tab q wed and 1 po qd sun 02/16/20   Opalski, Neoma Laming, DO  zolpidem (AMBIEN CR) 12.5 MG CR tablet Take 12.5 mg by mouth at bedtime as needed. 09/13/19   [provider]  ferrous sulfate 325 (65 FE) MG EC tablet Take 1 tablet (325 mg total) by mouth 2 (two)  times daily with a meal. 02/05/17 08/25/19  Armbruster, Carlota Raspberry, MD  Fluticasone-Salmeterol (ADVAIR DISKUS) 250-50 MCG/DOSE AEPB Advair Diskus 250 mcg-50 mcg/dose powder for inhalation  10/06/19  [provider]  loratadine (CLARITIN) 10 MG tablet Take 1 tablet (10 mg total) by mouth daily. 12/24/17 01/05/20  Azzie Glatter, FNP    Family History Family History  Problem Relation Age of Onset  . Diabetes Mother   . Hypertension Mother   . Hyperlipidemia Mother   . Sleep apnea Mother   . Thyroid disease Sister   . Dementia Father   . Heart attack Maternal Grandmother   . Stroke Maternal Grandfather   . Asthma Son   . Healthy Son   . Asthma Daughter   . Healthy Daughter   . Colon cancer Neg Hx   . Colon polyps Neg Hx   . Esophageal cancer Neg Hx   . Rectal cancer Neg Hx   . Stomach cancer Neg Hx     Social History Social History   Tobacco Use  . Smoking status: Never Smoker  . Smokeless tobacco: Never Used  Vaping Use  . Vaping Use: Never used  Substance Use Topics  . Alcohol use: Yes    Comment: occasional   . Drug use: No     Allergies   Aspirin, Ibuprofen, and [place] [fish allergy]   Review of Systems Review of Systems   Physical Exam Triage Vital Signs ED Triage Vitals  Enc Vitals Group     BP 03/01/20 1228 114/73     Pulse Rate 03/01/20 1228 68     Resp 03/01/20 1228 18     Temp 03/01/20 1228 98.6 F (37 C)     Temp Source 03/01/20 1228 Oral     SpO2 03/01/20 1228 100 %     Weight --      Height --      Head Circumference --      Peak Flow --      Pain Score 03/01/20 1226 10     Pain Loc --      Pain Edu? --      Excl. in  GC? --    No data found.  Updated Vital Signs BP 114/73 (BP Location: Right Arm)   Pulse 68   Temp 98.6 F (37 C) (Oral)   Resp 18   LMP 12/27/2003   SpO2 100%   Visual Acuity Right Eye Distance:   Left Eye Distance:   Bilateral Distance:    Right Eye Near:   Left Eye Near:    Bilateral Near:      Physical Exam Vitals and nursing note reviewed.  Constitutional:      General: She is not in acute distress.    Appearance: Normal appearance. She is not ill-appearing, toxic-appearing or diaphoretic.  HENT:     Head: Normocephalic.     Comments: Tender to palpation of right frontal area, right temporal area and facial area.  Some pain palpated behind the right ear. No pulsating or masses    Right Ear: Tympanic membrane and ear canal normal.     Nose: Nose normal.     Mouth/Throat:     Pharynx: Oropharynx is clear.  Eyes:     Extraocular Movements: Extraocular movements intact.     Conjunctiva/sclera: Conjunctivae normal.     Pupils: Pupils are equal, round, and reactive to light.  Pulmonary:     Effort: Pulmonary effort is normal.  Musculoskeletal:        General: Normal range of motion.     Cervical back: Normal range of motion.  Skin:    General: Skin is warm and dry.     Findings: No rash.  Neurological:     Mental Status: She is alert.  Psychiatric:        Mood and Affect: Mood normal.      UC Treatments / Results  Labs (all labs ordered are listed, but only abnormal results are displayed) Labs Reviewed - No data to display  EKG   Radiology No results found.  Procedures Procedures (including critical care time)  Medications Ordered in UC Medications - No data to display  Initial Impression / Assessment and Plan / UC Course  I have reviewed the triage vital signs and the nursing notes.  Pertinent labs & imaging results that were available during my care of the patient were reviewed by me and considered in my medical decision making (see chart for details).     Facial pain Most likely patient's pain is associated with trigeminal neuralgia Patient had recent MRI that was negative.  She is seeing her PCP, ENT and TMJ specialists without any specific diagnosis We will trial gabapentin for pain Specific instructions on how to take this medication.   Recommended start with 300 at bedtime and may increase slowly to 300 up to 3 times a day.  Recommended not mixing gabapentin with her Ambien, this may cause increased drowsiness and dizziness. Recommend to call her neurologist for follow-up Final Clinical Impressions(s) / UC Diagnoses   Final diagnoses:  Facial pain     Discharge Instructions     Start you on gabapentin for nerve pain. Start with 300 mg which would be half a tablet at bedtime to see how you feel with this medicine.  You can take it up to 3 times a day Be careful with taking this medication with your Ambien at bedtime it may increase drowsiness or dizziness Call your neurologist for a follow-up appointment    ED Prescriptions    Medication Sig Dispense Auth. Provider   gabapentin (NEURONTIN) 600 MG tablet Take 0.5 tablets (300 mg  total) by mouth at bedtime. Start with 300 mg at bedtime which would be half tab and then can increase slowly to up to 3 times a day once tolerance is met 30 tablet Prudencio Velazco A, NP     PDMP not reviewed this encounter.   Loura Halt A, NP 03/02/20 1015

## 2020-03-03 ENCOUNTER — Telehealth: Payer: Self-pay | Admitting: Neurology

## 2020-03-03 NOTE — Telephone Encounter (Signed)
Patient called to schedule a follow up, and was added to waitlist. She was seen in the ED 03/01/20 for facial pain. She is having facial pain and eye pain. They put her on gabapentin but she states that it made her really sick. She went to the eye dr. Quincy Carnes told her that her optic nerve was swollen, her vision was fine. She is going to follow up with them again on Monday.

## 2020-03-03 NOTE — Telephone Encounter (Signed)
Telephone call to pt, Wanted to know the eye doctor she saw.   Per pt she say DR. Groat on [address]. [phone].  Pt gave DR. Jaffe permission to give him to discuss his findings.

## 2020-03-06 ENCOUNTER — Ambulatory Visit (INDEPENDENT_AMBULATORY_CARE_PROVIDER_SITE_OTHER): Payer: 59 | Admitting: Family Medicine

## 2020-03-06 ENCOUNTER — Other Ambulatory Visit: Payer: Self-pay

## 2020-03-06 ENCOUNTER — Ambulatory Visit (INDEPENDENT_AMBULATORY_CARE_PROVIDER_SITE_OTHER): Payer: 59 | Admitting: Rheumatology

## 2020-03-06 ENCOUNTER — Encounter: Payer: Self-pay | Admitting: Rheumatology

## 2020-03-06 VITALS — BP 115/77 | HR 64 | Resp 14 | Ht 64.0 in | Wt 229.6 lb

## 2020-03-06 DIAGNOSIS — M791 Myalgia, unspecified site: Secondary | ICD-10-CM

## 2020-03-06 DIAGNOSIS — H1010 Acute atopic conjunctivitis, unspecified eye: Secondary | ICD-10-CM

## 2020-03-06 DIAGNOSIS — L501 Idiopathic urticaria: Secondary | ICD-10-CM | POA: Diagnosis not present

## 2020-03-06 DIAGNOSIS — D508 Other iron deficiency anemias: Secondary | ICD-10-CM

## 2020-03-06 DIAGNOSIS — M19071 Primary osteoarthritis, right ankle and foot: Secondary | ICD-10-CM | POA: Diagnosis not present

## 2020-03-06 DIAGNOSIS — R768 Other specified abnormal immunological findings in serum: Secondary | ICD-10-CM | POA: Diagnosis not present

## 2020-03-06 DIAGNOSIS — E538 Deficiency of other specified B group vitamins: Secondary | ICD-10-CM

## 2020-03-06 DIAGNOSIS — R7303 Prediabetes: Secondary | ICD-10-CM

## 2020-03-06 DIAGNOSIS — R29810 Facial weakness: Secondary | ICD-10-CM

## 2020-03-06 DIAGNOSIS — Z8639 Personal history of other endocrine, nutritional and metabolic disease: Secondary | ICD-10-CM

## 2020-03-06 DIAGNOSIS — F411 Generalized anxiety disorder: Secondary | ICD-10-CM

## 2020-03-06 DIAGNOSIS — M19072 Primary osteoarthritis, left ankle and foot: Secondary | ICD-10-CM

## 2020-03-06 DIAGNOSIS — K219 Gastro-esophageal reflux disease without esophagitis: Secondary | ICD-10-CM

## 2020-03-06 DIAGNOSIS — J4540 Moderate persistent asthma, uncomplicated: Secondary | ICD-10-CM

## 2020-03-06 DIAGNOSIS — E559 Vitamin D deficiency, unspecified: Secondary | ICD-10-CM

## 2020-03-06 DIAGNOSIS — R7689 Other specified abnormal immunological findings in serum: Secondary | ICD-10-CM

## 2020-03-06 DIAGNOSIS — J309 Allergic rhinitis, unspecified: Secondary | ICD-10-CM

## 2020-03-06 DIAGNOSIS — K760 Fatty (change of) liver, not elsewhere classified: Secondary | ICD-10-CM

## 2020-03-07 ENCOUNTER — Other Ambulatory Visit: Payer: Self-pay

## 2020-03-07 ENCOUNTER — Inpatient Hospital Stay: Payer: 59 | Attending: Hematology | Admitting: Hematology

## 2020-03-07 ENCOUNTER — Inpatient Hospital Stay: Payer: 59

## 2020-03-07 VITALS — BP 127/80 | HR 59 | Temp 96.8°F | Resp 18 | Ht 64.0 in | Wt 230.2 lb

## 2020-03-07 DIAGNOSIS — H9209 Otalgia, unspecified ear: Secondary | ICD-10-CM

## 2020-03-07 DIAGNOSIS — R3915 Urgency of urination: Secondary | ICD-10-CM | POA: Diagnosis not present

## 2020-03-07 DIAGNOSIS — Z9079 Acquired absence of other genital organ(s): Secondary | ICD-10-CM | POA: Diagnosis not present

## 2020-03-07 DIAGNOSIS — D509 Iron deficiency anemia, unspecified: Secondary | ICD-10-CM

## 2020-03-07 DIAGNOSIS — R002 Palpitations: Secondary | ICD-10-CM

## 2020-03-07 DIAGNOSIS — R6884 Jaw pain: Secondary | ICD-10-CM

## 2020-03-07 DIAGNOSIS — R1310 Dysphagia, unspecified: Secondary | ICD-10-CM

## 2020-03-07 DIAGNOSIS — Z90722 Acquired absence of ovaries, bilateral: Secondary | ICD-10-CM | POA: Diagnosis not present

## 2020-03-07 DIAGNOSIS — Z90710 Acquired absence of both cervix and uterus: Secondary | ICD-10-CM | POA: Diagnosis not present

## 2020-03-07 DIAGNOSIS — R682 Dry mouth, unspecified: Secondary | ICD-10-CM

## 2020-03-07 DIAGNOSIS — R0602 Shortness of breath: Secondary | ICD-10-CM | POA: Diagnosis not present

## 2020-03-07 LAB — CBC WITH DIFFERENTIAL/PLATELET
Abs Immature Granulocytes: 0.02 10*3/uL (ref 0.00–0.07)
Basophils Absolute: 0 10*3/uL (ref 0.0–0.1)
Basophils Relative: 0 %
Eosinophils Absolute: 0.1 10*3/uL (ref 0.0–0.5)
Eosinophils Relative: 1 %
HCT: 36.4 % (ref 36.0–46.0)
Hemoglobin: 11.2 g/dL — ABNORMAL LOW (ref 12.0–15.0)
Immature Granulocytes: 0 %
Lymphocytes Relative: 48 %
Lymphs Abs: 3.6 10*3/uL (ref 0.7–4.0)
MCH: 24.4 pg — ABNORMAL LOW (ref 26.0–34.0)
MCHC: 30.8 g/dL (ref 30.0–36.0)
MCV: 79.3 fL — ABNORMAL LOW (ref 80.0–100.0)
Monocytes Absolute: 0.5 10*3/uL (ref 0.1–1.0)
Monocytes Relative: 7 %
Neutro Abs: 3.3 10*3/uL (ref 1.7–7.7)
Neutrophils Relative %: 44 %
Platelets: 356 10*3/uL (ref 150–400)
RBC: 4.59 MIL/uL (ref 3.87–5.11)
RDW: 15.7 % — ABNORMAL HIGH (ref 11.5–15.5)
WBC: 7.5 10*3/uL (ref 4.0–10.5)
nRBC: 0 % (ref 0.0–0.2)

## 2020-03-07 LAB — CMP (CANCER CENTER ONLY)
ALT: 25 U/L (ref 0–44)
AST: 17 U/L (ref 15–41)
Albumin: 4.3 g/dL (ref 3.5–5.0)
Alkaline Phosphatase: 45 U/L (ref 38–126)
Anion gap: 7 (ref 5–15)
BUN: 11 mg/dL (ref 6–20)
CO2: 29 mmol/L (ref 22–32)
Calcium: 9 mg/dL (ref 8.9–10.3)
Chloride: 103 mmol/L (ref 98–111)
Creatinine: 0.87 mg/dL (ref 0.44–1.00)
GFR, Estimated: >60 mL/min (ref >60)
Glucose, Bld: 88 mg/dL (ref 70–99)
Potassium: 3.8 mmol/L (ref 3.5–5.1)
Sodium: 139 mmol/L (ref 135–145)
Total Bilirubin: 0.3 mg/dL (ref 0.3–1.2)
Total Protein: 7.8 g/dL (ref 6.5–8.1)

## 2020-03-07 LAB — IRON AND TIBC
Iron: 32 ug/dL — ABNORMAL LOW (ref 41–142)
Saturation Ratios: 9 % — ABNORMAL LOW (ref 21–57)
TIBC: 343 ug/dL (ref 236–444)
UIBC: 311 ug/dL (ref 120–384)

## 2020-03-07 LAB — FERRITIN: Ferritin: 48 ng/mL (ref 11–307)

## 2020-03-07 MED ORDER — POLYSACCHARIDE IRON COMPLEX 125 MG/5ML PO LIQD: 125.0000 mg | Freq: Every day | ORAL | 1 refills | Status: DC

## 2020-03-07 NOTE — Progress Notes (Signed)
HEMATOLOGY/ONCOLOGY CONSULTATION NOTE  Date of Service: 03/07/2020  Patient Care Team: [name], FNP as PCP - General (General Practice) Chilton Si, MD as PCP - Cardiology (Cardiology) Clarene Duke, Karma Lew, RN as [facility] Care Management Drema Dallas, DO as Consulting Physician (Neurology)  CHIEF COMPLAINTS/PURPOSE OF CONSULTATION:  IDA  HISTORY OF PRESENTING ILLNESS:   [name] is a wonderful 44 y.o. female who has been referred to [place] by Dr. Adela Lank for evaluation and management of iron deficiency anemia. The pt reports that she is doing well overall.   The pt reports that she has been anemic for as long as she can remember. Dr. Adela Lank completed an Upper Endoscopy earlier this year to work up the cause for her anemia. No cause has been found yet. Pt also had a Colonoscopy in 2018 and a Capsule Endoscopy in 2019. She denies significant hemorrhoidal bleeding. Pt stopped having menstrual cycles in 2005 after having a partial hysterectomy for Endometriosis. She does not take much OTC pain medication as it upsets her stomach. Pt is currently taking high-dose Vitamin D twice per week and Calcium daily. She does not have thorough knowledge of her paternal family history, but is not aware of any family history of blood disorders or cancers.   Pt is having pain around her ear, right-sided jaw pain, dry mouth, dry eyes & burning sensation mainly in her right eye. She has tried eye lubricants and steroidal eye drops to no avail. Pt has had these symptoms evaluated by ENT & Optometrist, who found no cause. These symptoms have been intermittent for the last month. Pt also reports recent increase in urinary urgency, SOB, and new heart palpitations. She has had difficulty swallowing since 2018, which has not improved since dilation was performed during the latest Endoscopy. Pt was recently on Reclast but has been off for a few months. Pt has been using OTC Ferrous  Sulfate and has experienced constipation.    Most recent lab results (02/16/2020) of CBC is as follows: all values are WNL except for Hgb at 10.7, HCT at 33.5, MCV at 75.6, RDW at 15.7. 02/16/2020 IBC + Ferritin is as follows: Iron at 58, TIBC at 257.0, Sat Ratios at 16.1, Ferritin at 53.5.  On review of systems, pt reports tingling/numbness in hands/feet, discomfort in mastoid area, jaw pain, dry mouth, dry eyes, ankle pain, urinary urgency, SOB, palpitations, constipation and denies joint swelling, rash, mouth sores, abdominal pain, abnormal/excessive bleeding and any other symptoms.   On PMHx the pt reports Endometriosis, Vitamin D deficiency, Osteoporosis, Inappropriate sinus tachycardia, Back pain, Anemia, Partial hysterectomy.  MEDICAL HISTORY:  Past Medical History:  Diagnosis Date  . Allergy   . Anemia   . Angio-edema   . Anxiety   . Apnea 12/08/2019  . Arthritis   . Asthma   . Back pain   . Constipation   . Fatty liver   . GERD (gastroesophageal reflux disease)   . Hx of endometriosis   . Inappropriate sinus tachycardia 12/08/2019  . Insomnia   . Joint pain   . Lactose intolerance   . Macromastia 07/2011  . Migraine   . Morbid obesity (HCC) 12/08/2019  . Osteoporosis   . Urticaria   . Vitamin D deficiency     SURGICAL HISTORY: Past Surgical History:  Procedure Laterality Date  . 24 HOUR PH STUDY N/A 01/12/2018   Procedure: 24 HOUR PH STUDY;  Surgeon: Napoleon Form, MD;  Location: [facility];  Service: Endoscopy;  Laterality: N/A;  . ABDOMINAL HYSTERECTOMY  12/12/2003  . ADENOIDECTOMY  05/2011  . ANKLE ARTHROSCOPY  12/29/2007   right; with extensive debridement  . BLADDER NECK RECONSTRUCTION  01/14/2011   Procedure: BLADDER NECK REPAIR;  Surgeon: Reva Bores, MD;  Location: [facility];  Service: Gynecology;  Laterality: N/A;  Laparoscopic Repair of Incidental Cystotomy  . BREAST REDUCTION SURGERY  08/05/2011   Procedure: MAMMARY REDUCTION  (BREAST);  Surgeon: Louisa Second, MD;  Location: [facility];  Service: Plastics;  Laterality: Bilateral;  bilateral  . BREAST SURGERY  2012   breast reduction  . CESAREAN SECTION  12/29/2000; 1994  . DILATION AND CURETTAGE OF UTERUS  07/08/2003   open laparoscopy  . ESOPHAGEAL MANOMETRY N/A 01/12/2018   Procedure: ESOPHAGEAL MANOMETRY (EM);  Surgeon: Napoleon Form, MD;  Location: [facility];  Service: Endoscopy;  Laterality: N/A;  . GIVENS CAPSULE STUDY N/A 06/23/2017   Procedure: GIVENS CAPSULE STUDY;  Surgeon: Benancio Deeds, MD;  Location: [facility];  Service: Gastroenterology;  Laterality: N/A;  . LAPAROSCOPIC SALPINGOOPHERECTOMY  01/14/2011   left  . PH IMPEDANCE STUDY N/A 01/12/2018   Procedure: PH IMPEDANCE STUDY;  Surgeon: Napoleon Form, MD;  Location: [facility];  Service: Endoscopy;  Laterality: N/A;  . REPAIR PERONEAL TENDONS ANKLE  01/03/2010   repair right subluxing peroneal tendons  . RESECTION DISTAL CLAVICAL Right 09/25/2017   Procedure: RESECTION DISTAL CLAVICAL;  Surgeon: Bjorn Pippin, MD;  Location: [facility];  Service: Orthopedics;  Laterality: Right;  . RIGHT OOPHORECTOMY     with lysis of adhesions  . SHOULDER ACROMIOPLASTY Right 09/25/2017   Procedure: SHOULDER ACROMIOPLASTY;  Surgeon: Bjorn Pippin, MD;  Location: [facility];  Service: Orthopedics;  Laterality: Right;  . SHOULDER ARTHROSCOPY WITH DEBRIDEMENT AND BICEP TENDON REPAIR Right 09/25/2017   Procedure: SHOULDER ARTHROSCOPY WITH DEBRIDEMENT AND BICEP TENDON REPAIR;  Surgeon: Bjorn Pippin, MD;  Location: [facility];  Service: Orthopedics;  Laterality: Right;  . SHOULDER ARTHROSCOPY WITH ROTATOR CUFF REPAIR Right 09/25/2017   Procedure: SHOULDER ARTHROSCOPY WITH ROTATOR CUFF REPAIR;  Surgeon: Bjorn Pippin, MD;  Location: [facility];  Service: Orthopedics;  Laterality: Right;  . TONSILLECTOMY  as a child  . TUBAL LIGATION  12/29/2000  . UPPER  GASTROINTESTINAL ENDOSCOPY      SOCIAL HISTORY: Social History   Socioeconomic History  . Marital status: Divorced    Spouse name: Not on file  . Number of children: 2  . Years of education: 12th  . Highest education level: Not on file  Occupational History  . Occupation: Facilities manager: OTHER    Comment: [employer]  Tobacco Use  . Smoking status: Never Smoker  . Smokeless tobacco: Never Used  Vaping Use  . Vaping Use: Never used  Substance and Sexual Activity  . Alcohol use: Yes    Comment: occasional   . Drug use: No  . Sexual activity: Not on file  Other Topics Concern  . Not on file  Social History Narrative   Pt lives in 2 story town home with her son   Has 2 children   High school Paramedic for [employer]   Right handed   Social Determinants of Health   Financial Resource Strain:   . Difficulty of Paying Living Expenses: Not on file  Food Insecurity: No Food Insecurity  . Worried  About Running Out of Food in the Last Year: Never true  . Ran Out of Food in the Last Year: Never true  Transportation Needs: No Transportation Needs  . Lack of Transportation (Medical): No  . Lack of Transportation (Non-Medical): No  Physical Activity:   . Days of Exercise per Week: Not on file  . Minutes of Exercise per Session: Not on file  Stress:   . Feeling of Stress : Not on file  Social Connections:   . Frequency of Communication with Friends and Family: Not on file  . Frequency of Social Gatherings with Friends and Family: Not on file  . Attends Religious Services: Not on file  . Active Member of Clubs or Organizations: Not on file  . Attends Banker Meetings: Not on file  . Marital Status: Not on file  Intimate Partner Violence:   . Fear of Current or Ex-Partner: Not on file  . Emotionally Abused: Not on file  . Physically Abused: Not on file  . Sexually Abused: Not on file    FAMILY HISTORY: Family History  Problem  Relation Age of Onset  . Diabetes Mother   . Hypertension Mother   . Hyperlipidemia Mother   . Sleep apnea Mother   . Thyroid disease Sister   . Dementia Father   . Heart attack Maternal Grandmother   . Stroke Maternal Grandfather   . Asthma Son   . Healthy Son   . Asthma Daughter   . Healthy Daughter   . Colon cancer Neg Hx   . Colon polyps Neg Hx   . Esophageal cancer Neg Hx   . Rectal cancer Neg Hx   . Stomach cancer Neg Hx     ALLERGIES:  is allergic to aspirin, ibuprofen, and salmon [fish allergy].  MEDICATIONS:  Current Outpatient Medications  Medication Sig Dispense Refill  . albuterol (VENTOLIN HFA) 108 (90 Base) MCG/ACT inhaler Inhale 2 puffs into the lungs every 6 (six) hours as needed for wheezing or shortness of breath. 1 each 11  . cetirizine (ZYRTEC) 10 MG tablet Take 1 tablet (10 mg total) by mouth daily as needed for rhinitis. 30 tablet 5  . EPINEPHrine (EPIPEN 2-PAK) 0.3 mg/0.3 mL IJ SOAJ injection Inject 0.3 mLs (0.3 mg total) into the muscle as needed for anaphylaxis. 2 each 2  . esomeprazole (NEXIUM) 40 MG capsule Take 1 capsule (40 mg total) by mouth daily at 12 noon. 30 capsule 6  . famotidine (PEPCID) 20 MG tablet Take 20 mg by mouth daily.    . fluticasone furoate-vilanterol (BREO ELLIPTA) 100-25 MCG/INH AEPB Inhale 1 puff into the lungs daily. 1 each 11  . Polysaccharide Iron Complex 125 MG/5ML LIQD Take 125 mg by mouth daily. 180 mL 1  . predniSONE (DELTASONE) 10 MG tablet Take 4 tabs po daily x 3 days; then 3 tabs daily x3 days; then 2 tabs daily x3 days; then 1 tab daily x 3 days; then stop 30 tablet 0  . RESTASIS 0.05 % ophthalmic emulsion 1 drop 2 (two) times daily.    . sucralfate (CARAFATE) 1 g tablet Take 1 tablet (1 g total) by mouth every 6 (six) hours. Slowly dissolve tablet in 1 Tablespoon of distilled water before ingestion 90 tablet 2  . Vitamin D, Ergocalciferol, (DRISDOL) 1.25 MG (50000 UNIT) CAPS capsule 1 tab q wed and 1 po qd sun 8  capsule 0  . zolpidem (AMBIEN CR) 12.5 MG CR tablet Take 12.5 mg by mouth at  bedtime as needed.     No current facility-administered medications for this visit.    REVIEW OF SYSTEMS:    10 Point review of Systems was done is negative except as noted above.  PHYSICAL EXAMINATION: ECOG PERFORMANCE STATUS: 1 - Symptomatic but completely ambulatory  . Vitals:   03/07/20 1116  BP: 127/80  Pulse: (!) 59  Resp: 18  Temp: (!) 96.8 F (36 C)  SpO2: 100%   Filed Weights   03/07/20 1116  Weight: 230 lb 3.2 oz (104.4 kg)   .Body mass index is 39.51 kg/m.  GENERAL:alert, in no acute distress and comfortable SKIN: no acute rashes, no significant lesions EYES: conjunctiva are pink and non-injected, sclera anicteric OROPHARYNX: MMM, no exudates, no oropharyngeal erythema or ulceration NECK: supple, no JVD LYMPH:  no palpable lymphadenopathy in the cervical, axillary or inguinal regions LUNGS: clear to auscultation b/l with normal respiratory effort HEART: regular rate & rhythm ABDOMEN:  normoactive bowel sounds , non tender, not distended. Extremity: no pedal edema PSYCH: alert & oriented x 3 with fluent speech NEURO: no focal motor/sensory deficits  LABORATORY DATA:  I have reviewed the data as listed  . CBC Latest Ref Rng & Units 03/07/2020 02/16/2020 01/04/2020  WBC 4.0 - 10.5 K/uL 7.5 6.5 4.6  Hemoglobin 12.0 - 15.0 g/dL 11.2(L) 10.7(L) 11.0(L)  Hematocrit 36 - 46 % 36.4 33.5(L) 35.8  Platelets 150 - 400 K/uL 356 278.0 345   . CBC    Component Value Date/Time   WBC 7.5 03/07/2020 1236   RBC 4.59 03/07/2020 1236   HGB 11.2 (L) 03/07/2020 1236   HGB 11.0 (L) 01/04/2020 1046   HCT 36.4 03/07/2020 1236   HCT 35.8 01/04/2020 1046   PLT 356 03/07/2020 1236   PLT 345 01/04/2020 1046   MCV 79.3 (L) 03/07/2020 1236   MCV 77 (L) 01/04/2020 1046   MCH 24.4 (L) 03/07/2020 1236   MCHC 30.8 03/07/2020 1236   RDW 15.7 (H) 03/07/2020 1236   RDW 13.9 01/04/2020 1046    LYMPHSABS 3.6 03/07/2020 1236   LYMPHSABS 1.9 01/04/2020 1046   MONOABS 0.5 03/07/2020 1236   EOSABS 0.1 03/07/2020 1236   EOSABS 0.1 01/04/2020 1046   BASOSABS 0.0 03/07/2020 1236   BASOSABS 0.0 01/04/2020 1046    . CMP Latest Ref Rng & Units 03/07/2020 01/04/2020 12/05/2019  Glucose 70 - 99 mg/dL 88 94 93  BUN 6 - 20 mg/dL 11 11 9   Creatinine 0.44 - 1.00 mg/dL 9.520.87 8.410.79 3.240.81  Sodium 135 - 145 mmol/L 139 137 136  Potassium 3.5 - 5.1 mmol/L 3.8 4.2 3.7  Chloride 98 - 111 mmol/L 103 99 102  CO2 22 - 32 mmol/L 29 26 26   Calcium 8.9 - 10.3 mg/dL 9.0 9.3 4.0(N8.8(L)  Total Protein 6.5 - 8.1 g/dL 7.8 7.4 -  Total Bilirubin 0.3 - 1.2 mg/dL 0.3 0.3 -  Alkaline Phos 38 - 126 U/L 45 74 -  AST 15 - 41 U/L 17 20 -  ALT 0 - 44 U/L 25 19 -     RADIOGRAPHIC STUDIES: I have personally reviewed the radiological images as listed and agreed with the findings in the report. XR Foot 2 Views Left  Result Date: 02/18/2020 First MTP, PIP and DIP narrowing was noted.  No intertarsal, or tibiotalar joint space narrowing was noted.  Posterior calcaneal spur was noted. Impression: These findings are consistent with osteoarthritis of the foot.  XR Foot 2 Views Right  Result Date: 02/18/2020 First  MTP, PIP and DIP narrowing was noted.  No intertarsal, or tibiotalar joint space narrowing was noted.  Posterior calcaneal spur was noted. Impression: These findings are consistent with osteoarthritis of the foot.  XR Hand 2 View Left  Result Date: 02/18/2020 No MCP, PIP or DIP narrowing was noted.  No intercarpal or radiocarpal joint space narrowing was noted.  No erosive changes were noted. Impression: Unremarkable x-ray of the hand.  XR Hand 2 View Right  Result Date: 02/18/2020 No MCP, PIP or DIP narrowing was noted.  No intercarpal or radiocarpal joint space narrowing was noted.  No erosive changes were noted. Impression: Unremarkable x-ray of the hand.   ASSESSMENT & PLAN:   44 yo female with   1)  Microcytic Anemia PLAN: -Discussed patient's most recent labs from 02/16/2020, which revealed mild, microcytic anemia, Ferritin & Sat Ratios are okay. -Advised pt that her iron stores are not very low. Could start with PO Iron that is tolerated well to see if it would increase her Ferritin.  -Advised pt that Iron Polysaccharide is generally tolerated better and should cause less constipation.  -Advised pt that she may have a mild thalassemia trait that could explain her anemia despite lack of significant iron deficiency or bleeding. -Rheumatology could consider completing w/o to r/o Sarcoidosis. Continue to f/u with Dr. Corliss Skains.  -Rx Iron Polysaccharide -Will get labs today  -Will see back in 3 months with labs   FOLLOW UP: Labs today RTC with Dr Candise Che with labs in 3 months   All of the patients questions were answered with apparent satisfaction. The patient knows to call the clinic with any problems, questions or concerns.  I spent 30 mins counseling the patient face to face. The total time spent in the appointment was 45 minutes and more than 50% was on counseling and direct patient cares.    Wyvonnia Lora MD MS AAHIVMS [facility] Hematology/Oncology Physician [facility]  (Office):       [phone] (Work cell):  [phone] (Fax):           [phone]  03/07/2020 5:08 PM  I, Carollee Herter, am acting as a scribe for Dr. Wyvonnia Lora.   .I have reviewed the above documentation for accuracy and completeness, and I agree with the above. Johney Maine MD

## 2020-03-08 ENCOUNTER — Encounter (INDEPENDENT_AMBULATORY_CARE_PROVIDER_SITE_OTHER): Payer: Self-pay | Admitting: Family Medicine

## 2020-03-08 ENCOUNTER — Other Ambulatory Visit: Payer: Self-pay

## 2020-03-08 ENCOUNTER — Ambulatory Visit (INDEPENDENT_AMBULATORY_CARE_PROVIDER_SITE_OTHER): Payer: 59 | Admitting: Family Medicine

## 2020-03-08 ENCOUNTER — Other Ambulatory Visit: Payer: Self-pay | Admitting: *Deleted

## 2020-03-08 ENCOUNTER — Encounter: Payer: Self-pay | Admitting: Nurse Practitioner

## 2020-03-08 ENCOUNTER — Ambulatory Visit (INDEPENDENT_AMBULATORY_CARE_PROVIDER_SITE_OTHER): Payer: 59 | Admitting: Nurse Practitioner

## 2020-03-08 VITALS — BP 105/70 | HR 61 | Temp 97.4°F | Ht 64.0 in | Wt 224.0 lb

## 2020-03-08 DIAGNOSIS — Z6838 Body mass index (BMI) 38.0-38.9, adult: Secondary | ICD-10-CM

## 2020-03-08 DIAGNOSIS — R519 Headache, unspecified: Secondary | ICD-10-CM | POA: Diagnosis not present

## 2020-03-08 DIAGNOSIS — G629 Polyneuropathy, unspecified: Secondary | ICD-10-CM

## 2020-03-08 DIAGNOSIS — R21 Rash and other nonspecific skin eruption: Secondary | ICD-10-CM

## 2020-03-08 DIAGNOSIS — Z9189 Other specified personal risk factors, not elsewhere classified: Secondary | ICD-10-CM | POA: Diagnosis not present

## 2020-03-08 DIAGNOSIS — E559 Vitamin D deficiency, unspecified: Secondary | ICD-10-CM | POA: Diagnosis not present

## 2020-03-08 LAB — MULTIPLE MYELOMA PANEL, SERUM
Albumin SerPl Elph-Mcnc: 4.1 g/dL (ref 2.9–4.4)
Albumin/Glob SerPl: 1.3 (ref 0.7–1.7)
Alpha 1: 0.1 g/dL (ref 0.0–0.4)
Alpha2 Glob SerPl Elph-Mcnc: 0.7 g/dL (ref 0.4–1.0)
B-Globulin SerPl Elph-Mcnc: 1.1 g/dL (ref 0.7–1.3)
Gamma Glob SerPl Elph-Mcnc: 1.3 g/dL (ref 0.4–1.8)
Globulin, Total: 3.2 g/dL (ref 2.2–3.9)
IgA: 254 mg/dL (ref 87–352)
IgG (Immunoglobin G), Serum: 1271 mg/dL (ref 586–1602)
IgM (Immunoglobulin M), Srm: 96 mg/dL (ref 26–217)
Total Protein ELP: 7.3 g/dL (ref 6.0–8.5)

## 2020-03-08 LAB — ANGIOTENSIN CONVERTING ENZYME: Angiotensin-Converting Enzyme: 22 U/L (ref 14–82)

## 2020-03-08 MED ORDER — VALACYCLOVIR HCL 1 G PO TABS: 1000.0000 mg | ORAL_TABLET | Freq: Two times a day (BID) | ORAL | 0 refills | Status: DC

## 2020-03-08 MED ORDER — POLYSACCHARIDE IRON COMPLEX 125 MG/5ML PO LIQD: 125.0000 mg | Freq: Every day | ORAL | 1 refills | Status: DC

## 2020-03-08 MED ORDER — VITAMIN D (ERGOCALCIFEROL) 1.25 MG (50000 UNIT) PO CAPS: ORAL_CAPSULE | ORAL | 0 refills | Status: DC

## 2020-03-08 NOTE — Progress Notes (Signed)
This visit occurred during the SARS-CoV-2 public health emergency.  Safety protocols were in place, including screening questions prior to the visit, additional usage of staff PPE, and extensive cleaning of exam room while observing appropriate contact time as indicated for disinfecting solutions.  Subjective:     Patient ID: [name] , female    DOB: 04-24-1976 , 44 y.o.   MRN: [id number]   Chief Complaint  Patient presents with  . Facial Pain    HPI  She is having problems wearing her glasses, she is having eye pain. She is having facial pain. She has seen the eye doctor and eye surgeon and they did not find.  She went to the eye doctor 2 times last month and once yesterday.  She has been on steroid drops for her eyes. She has not had the shingles vaccine.  She has attempted to reach out the neurologist. She is taking gabapentin from the urgent care but is causing her to be sleepy and unable to work.  She seen Dr. Loni [name] for her abnormal ANA and these same symptoms    Past Medical History:  Diagnosis Date  . Allergy   . Anemia   . Angio-edema   . Anxiety   . Apnea 12/08/2019  . Arthritis   . Asthma   . Back pain   . Constipation   . Fatty liver   . GERD (gastroesophageal reflux disease)   . Hx of endometriosis   . Inappropriate sinus tachycardia 12/08/2019  . Insomnia   . Joint pain   . Lactose intolerance   . Macromastia 07/2011  . Migraine   . Morbid obesity (HCC) 12/08/2019  . Osteoporosis   . Urticaria   . Vitamin D deficiency      Family History  Problem Relation Age of Onset  . Diabetes Mother   . Hypertension Mother   . Hyperlipidemia Mother   . Sleep apnea Mother   . Thyroid disease Sister   . Dementia Father   . Heart attack Maternal Grandmother   . Stroke Maternal Grandfather   . Asthma Son   . Healthy Son   . Asthma Daughter   . Healthy Daughter   . Colon cancer Neg Hx   . Colon polyps Neg Hx   . Esophageal cancer Neg Hx   . Rectal cancer  Neg Hx   . Stomach cancer Neg Hx      Current Outpatient Medications:  .  albuterol (VENTOLIN HFA) 108 (90 Base) MCG/ACT inhaler, Inhale 2 puffs into the lungs every 6 (six) hours as needed for wheezing or shortness of breath., Disp: 1 each, Rfl: 11 .  cetirizine (ZYRTEC) 10 MG tablet, Take 1 tablet (10 mg total) by mouth daily as needed for rhinitis., Disp: 30 tablet, Rfl: 5 .  EPINEPHrine (EPIPEN 2-PAK) 0.3 mg/0.3 mL IJ SOAJ injection, Inject 0.3 mLs (0.3 mg total) into the muscle as needed for anaphylaxis., Disp: 2 each, Rfl: 2 .  esomeprazole (NEXIUM) 40 MG capsule, Take 1 capsule (40 mg total) by mouth daily at 12 noon., Disp: 30 capsule, Rfl: 6 .  famotidine (PEPCID) 20 MG tablet, Take 20 mg by mouth daily., Disp: , Rfl:  .  Polysaccharide Iron Complex 125 MG/5ML LIQD, Take 125 mg by mouth daily., Disp: 180 mL, Rfl: 1 .  RESTASIS 0.05 % ophthalmic emulsion, 1 drop 2 (two) times daily., Disp: , Rfl:  .  sucralfate (CARAFATE) 1 g tablet, Take 1 tablet (1 g total) by mouth  every 6 (six) hours. Slowly dissolve tablet in 1 Tablespoon of distilled water before ingestion, Disp: 90 tablet, Rfl: 2 .  Vitamin D, Ergocalciferol, (DRISDOL) 1.25 MG (50000 UNIT) CAPS capsule, 1 tab q wed and 1 po qd sun, Disp: 8 capsule, Rfl: 0 .  zolpidem (AMBIEN CR) 12.5 MG CR tablet, Take 12.5 mg by mouth at bedtime as needed., Disp: , Rfl:  .  fluticasone furoate-vilanterol (BREO ELLIPTA) 200-25 MCG/INH AEPB, Inhale 1 puff into the lungs daily., Disp: 30 each, Rfl: 5 .  valACYclovir (VALTREX) 1000 MG tablet, Take 1 tablet (1,000 mg total) by mouth 2 (two) times daily., Disp: 20 tablet, Rfl: 0   Allergies  Allergen Reactions  . Aspirin   . Ibuprofen   . [name] [Fish Allergy]      Review of Systems  Constitutional: Negative.   Respiratory: Negative.   Cardiovascular: Negative.  Negative for chest pain, palpitations and leg swelling.  Musculoskeletal:       Right side of face with sharp pain   Neurological:  Negative for dizziness and headaches.     There were no vitals filed for this visit. There is no height or weight on file to calculate BMI.   Objective:  Physical Exam Vitals reviewed.  Constitutional:      General: She is not in acute distress.    Appearance: Normal appearance.  Pulmonary:     Effort: Pulmonary effort is normal. No respiratory distress.  Musculoskeletal:        General: Tenderness (right facial not tender at this time, TMJ without crepitus) present.  Neurological:     General: No focal deficit present.     Mental Status: She is alert and oriented to person, place, and time.     Cranial Nerves: No cranial nerve deficit.  Psychiatric:        Mood and Affect: Mood normal.        Behavior: Behavior normal.        Thought Content: Thought content normal.        Judgment: Judgment normal.         Assessment And Plan:     1. Facial pain  She is describing symptoms that are similar to having shingles, will treat with valacyclovir - valACYclovir (VALTREX) 1000 MG tablet; Take 1 tablet (1,000 mg total) by mouth 2 (two) times daily.  Dispense: 20 tablet; Refill: 0  2. Facial rash  She is concerned about a papular rash to chin, this may be related to the frequent use of the mask. She has tried facial cleansers without success.  She is requesting a referral to dermatology - Ambulatory referral to Dermatology     Patient was given opportunity to ask questions. Patient verbalized understanding of the plan and was able to repeat key elements of the plan. All questions were answered to their satisfaction.    Jeanell Sparrow, FNP, have reviewed all documentation for this visit. The documentation on 03/12/20 for the exam, diagnosis, procedures, and orders are all accurate and complete.  THE PATIENT IS ENCOURAGED TO PRACTICE SOCIAL DISTANCING DUE TO THE COVID-19 PANDEMIC.

## 2020-03-08 NOTE — Progress Notes (Signed)
Chief Complaint:   OBESITY [name] is here to discuss her progress with her obesity treatment plan along with follow-up of her obesity related diagnoses. [name] is on the Category 2 Plan and states she is following her eating plan approximately 80% of the time. [name] states she is exercising for 0 minutes 0 times per week.  Today's visit was #: 5 Starting weight: 239 lbs Starting date: 01/04/2020 Today's weight: 224 lbs Today's date: 03/13/2020 Total lbs lost to date: 15 lbs Total lbs lost since last in-office visit: 4 lbs Total weight loss percentage to date: -6.28%  Interim History: [name] says she has been eating everything on plan, but having some sweets cravings lately.  Not using snack calories at all.  Otherwise, hunger controlled and no concerns with plan or questions about it.  Working well for her.  Assessment/Plan:   1. Neuropathy, right side of face She is followed by Dr. Everlena Cooper at [facility] for this.  She has seen ENT, her PCP, and others.  She is still having discomfort at the right side of her face and jaw.  Difficult to eat at times.  Tried treatment with Neurontin from UC.  Plan:  Recommend she follow-up with her neurologist for treatment at this time.  It has been over 3 months since she was last seen.  2. Vitamin D deficiency, unspecified [name]'s Vitamin D level was 75.2 on 01/04/2020. She is currently taking prescription vitamin D 50,000 IU each week. She denies nausea, vomiting or muscle weakness.  -Refill Vitamin D, Ergocalciferol, (DRISDOL) 1.25 MG (50000 UNIT) CAPS capsule; 1 tab q wed and 1 po qd sun  Dispense: 8 capsule; Refill: 0  3. At risk for depression [name] was given approximately 8 minutes of depression risk counseling today. She has risk factors for depression including dealing with her neuropathic pain and frustration with that. We discussed the importance of a healthy work life balance, a healthy relationship with food and a good support  system.  4. Class 2 severe obesity with serious comorbidity and body mass index (BMI) of 38.0 to 38.9 in adult, unspecified obesity type (HCC)  [name] is currently in the action stage of change. As such, her goal is to continue with weight loss efforts. She has agreed to the Category 2 Plan.   Exercise goals: All adults should avoid inactivity. Some physical activity is better than none, and adults who participate in any amount of physical activity gain some health benefits.  Behavioral modification strategies: no skipping meals, meal planning and cooking strategies and better snacking choices.  [name] has agreed to follow-up with our clinic in 2-3 weeks. She was informed of the importance of frequent follow-up visits to maximize her success with intensive lifestyle modifications for her multiple health conditions.   Objective:   Blood pressure 105/70, pulse 61, temperature (!) 97.4 F (36.3 C), height 5\' 4"  (1.626 m), weight 224 lb (101.6 kg), last menstrual period 12/27/2003, SpO2 100 %. Body mass index is 38.45 kg/m.  General: Cooperative, alert, well developed, in no acute distress. HEENT: Conjunctivae and lids unremarkable. Cardiovascular: Regular rhythm.  Lungs: Normal work of breathing. Neurologic: No focal deficits.   Lab Results  Component Value Date   CREATININE 0.87 03/07/2020   BUN 11 03/07/2020   NA 139 03/07/2020   K 3.8 03/07/2020   CL 103 03/07/2020   CO2 29 03/07/2020   Lab Results  Component Value Date   ALT 25 03/07/2020   AST 17 03/07/2020  ALKPHOS 45 03/07/2020   BILITOT 0.3 03/07/2020   Lab Results  Component Value Date   HGBA1C 5.7 (H) 01/04/2020   HGBA1C 5.7 (H) 06/17/2019   HGBA1C 5.1 12/24/2017   HGBA1C 5.1 06/16/2017   Lab Results  Component Value Date   INSULIN 8.0 01/04/2020   Lab Results  Component Value Date   TSH 0.872 01/04/2020   Lab Results  Component Value Date   CHOL 223 (H) 01/04/2020   HDL 62 01/04/2020   LDLCALC 147  (H) 01/04/2020   TRIG 80 01/04/2020   CHOLHDL 2.9 06/17/2019   Lab Results  Component Value Date   WBC 7.5 03/07/2020   HGB 11.2 (L) 03/07/2020   HCT 36.4 03/07/2020   MCV 79.3 (L) 03/07/2020   PLT 356 03/07/2020   Lab Results  Component Value Date   IRON 32 (L) 03/07/2020   TIBC 343 03/07/2020   FERRITIN 48 03/07/2020   Attestation Statements:   Reviewed by clinician on day of visit: allergies, medications, problem list, medical history, surgical history, family history, social history, and previous encounter notes.  I, Insurance claims handler, CMA, am acting as Energy manager for [employer], DO.  I have reviewed the above documentation for accuracy and completeness, and I agree with the above. Carlye Grippe, D.O.  The 21st Century Cures Act was signed into law in 2016 which includes the topic of electronic health records.  This provides immediate access to information in MyChart.  This includes consultation notes, operative notes, office notes, lab results and pathology reports.  If you have any questions about what you read please let us know at your next visit so we can discuss your concerns and take corrective action if need be.  We are right here with you.

## 2020-03-09 LAB — HGB FRACTIONATION CASCADE
Hgb A2: 2.2 % (ref 1.8–3.2)
Hgb A: 97.8 % (ref 96.4–98.8)
Hgb F: 0 % (ref 0.0–2.0)
Hgb S: 0 %

## 2020-03-10 ENCOUNTER — Ambulatory Visit: Payer: 59 | Admitting: Rheumatology

## 2020-03-10 ENCOUNTER — Telehealth: Payer: Self-pay | Admitting: Primary Care

## 2020-03-10 LAB — COPPER, SERUM: Copper: 143 ug/dL (ref 80–158)

## 2020-03-10 MED ORDER — BREO ELLIPTA 200-25 MCG/INH IN AEPB: 1.0000 | INHALATION_SPRAY | Freq: Every day | RESPIRATORY_TRACT | 5 refills | Status: DC

## 2020-03-10 NOTE — Telephone Encounter (Signed)
Spoke with the pt   She seen Beth 02/22/20:    Return in about 6 months (around 08/22/2020). Recommendations: Continue Breo 100 one puff daily (rinse mouth after use) Use albuterol 2 puff every 6 hours as needed for breakthrough shortness of breath/wheezing       She states her SOB improved on the pred and then right back to where she was when she finished  She is using her Breo daily and has had to use her albuterol inhaler once  She only c/o SOB- denies any wheezing, cough, f/c/s, CP or other symptoms  Please advise, thanks!

## 2020-03-10 NOTE — Telephone Encounter (Signed)
Spoke with patient. She is aware of Beth's response and verbalized understanding. Nothing further needed at time of call.

## 2020-03-10 NOTE — Telephone Encounter (Signed)
Recommend increasing BREO to 200 mcg. Sent in prescription.

## 2020-03-11 DIAGNOSIS — B009 Herpesviral infection, unspecified: Secondary | ICD-10-CM | POA: Insufficient documentation

## 2020-03-11 DIAGNOSIS — N809 Endometriosis, unspecified: Secondary | ICD-10-CM | POA: Insufficient documentation

## 2020-03-11 DIAGNOSIS — N83209 Unspecified ovarian cyst, unspecified side: Secondary | ICD-10-CM | POA: Insufficient documentation

## 2020-03-13 ENCOUNTER — Encounter: Payer: Self-pay | Admitting: Hematology

## 2020-03-14 ENCOUNTER — Encounter: Payer: Self-pay | Admitting: Cardiovascular Disease

## 2020-03-14 ENCOUNTER — Ambulatory Visit (INDEPENDENT_AMBULATORY_CARE_PROVIDER_SITE_OTHER): Payer: 59 | Admitting: Cardiovascular Disease

## 2020-03-14 VITALS — BP 100/62 | HR 67 | Temp 96.1°F | Ht 64.0 in | Wt 225.8 lb

## 2020-03-14 DIAGNOSIS — I4711 Inappropriate sinus tachycardia, so stated: Secondary | ICD-10-CM

## 2020-03-14 DIAGNOSIS — R0602 Shortness of breath: Secondary | ICD-10-CM | POA: Diagnosis not present

## 2020-03-14 DIAGNOSIS — R0600 Dyspnea, unspecified: Secondary | ICD-10-CM

## 2020-03-14 DIAGNOSIS — R0609 Other forms of dyspnea: Secondary | ICD-10-CM

## 2020-03-14 DIAGNOSIS — R000 Tachycardia, unspecified: Secondary | ICD-10-CM | POA: Diagnosis not present

## 2020-03-14 HISTORY — DX: Other forms of dyspnea: R06.09

## 2020-03-14 HISTORY — DX: Dyspnea, unspecified: R06.00

## 2020-03-14 NOTE — Progress Notes (Signed)
Cardiology Office Note   Date:  03/14/2020   ID:  [name], DOB 16-Dec-1975, MRN [id number]  PCP:  [name], FNP  Cardiologist:   Chilton Si, MD   No chief complaint on file.    History of Present Illness: [name] is a 44 y.o. female who is being seen today for follow up.  She was initially seen 01/2017 for the evaluation of chest pain.  She has been seen in the emergency department several times with various complaints including chest pain, arm numbness, trouble swallowing, hives, hair loss, dysphagia, right hand and arm numbness and tingling.  Cardiac enzymes have been negative. EKGs have been negative for ischemia and showed sinus rhythm and tachycardia.  She was referred for ETT 01/24/17 that was negative for ischemia.  She achieved 11 METS on a Bruce protocol.  She also reported lower extremity edema, but none was noted on exam.  [name] was seen in the ED 02/19/17 with chest pain.  Cardiac enzymes were negative and chest CT-A was negative for PE.  No coronary calcification was noted.  She was referred for pulmonary function testing 03/2017 that was unremarkable.  She was given a prescription for metoprolol to help her palpitations.  However it made her feel tired so she stopped taking it.  She saw [name] 08/2019 with palpitations.  She wore an ambulatory monitor.  She was found to have sinus tachycardia and rare PVCs.  She reported chest pain at which time her heart rate was usually elevated.  She uses her Apple Watch and notes that her heart rate goes up to 170s at time.  She doesn't drink caffeine.  She has not been feeling stressed.  She struggles with pain in both legs.  It occurs when walking and doesn't go away until she lays down.  It is a burning pain and sometimes sharp.  She also struggles with R shoulder pain. Lately she hasn't had any chest pain or pressure.  She had a coronary CT-A 10/2019 that revealed a calcium score of 0 and no CAD.    Ms.  [name] has struggled with pain in R eye and ear pain for two months.  She has seen her eye doctor and PCP who think it is nerve related.  She saw neurology.  She tried gabapentin but couldn't tolerate it. She continues to have discomfort.  She is struggling to work.  She also struggles with urinary incontinence and is considering having a spinal cord stimulator implanted.  She took prednisone and had more palpitations.  Since reducing the dose her palpitations have improved. Her inhalers also seem to trigger her palpitations. She had a sleep study that had moderate OSA during REM sleep.  She didn't meet criteria for CPAP and lifestyle was recommended.  She struggles with chronic pain and has difficulty exercising.  She would like to try and do more but gets short of breath when she exercises so she gives up.  She denies any lower extremity edema, orthopnea, or PND.  She also wonders why her blood pressure seems to be low lately.  She has no lightheadedness or dizziness.  Past Medical History:  Diagnosis Date  . Allergy   . Anemia   . Angio-edema   . Anxiety   . Apnea 12/08/2019  . Arthritis   . Asthma   . Back pain   . Constipation   . Exertional dyspnea 03/14/2020  . Fatty liver   . GERD (gastroesophageal reflux  disease)   . Hx of endometriosis   . Inappropriate sinus tachycardia 12/08/2019  . Insomnia   . Joint pain   . Lactose intolerance   . Macromastia 07/2011  . Migraine   . Morbid obesity (HCC) 12/08/2019  . Osteoporosis   . Urticaria   . Vitamin D deficiency     Past Surgical History:  Procedure Laterality Date  . 24 HOUR PH STUDY N/A 01/12/2018   Procedure: 24 HOUR PH STUDY;  Surgeon: Napoleon Form, MD;  Location: [facility];  Service: Endoscopy;  Laterality: N/A;  . ABDOMINAL HYSTERECTOMY  12/12/2003  . ADENOIDECTOMY  05/2011  . ANKLE ARTHROSCOPY  12/29/2007   right; with extensive debridement  . BLADDER NECK RECONSTRUCTION  01/14/2011   Procedure: BLADDER NECK REPAIR;   Surgeon: Reva Bores, MD;  Location: [facility];  Service: Gynecology;  Laterality: N/A;  Laparoscopic Repair of Incidental Cystotomy  . BREAST REDUCTION SURGERY  08/05/2011   Procedure: MAMMARY REDUCTION  (BREAST);  Surgeon: Louisa Second, MD;  Location: [facility];  Service: Plastics;  Laterality: Bilateral;  bilateral  . BREAST SURGERY  2012   breast reduction  . CESAREAN SECTION  12/29/2000; 1994  . DILATION AND CURETTAGE OF UTERUS  07/08/2003   open laparoscopy  . ESOPHAGEAL MANOMETRY N/A 01/12/2018   Procedure: ESOPHAGEAL MANOMETRY (EM);  Surgeon: Napoleon Form, MD;  Location: [facility];  Service: Endoscopy;  Laterality: N/A;  . GIVENS CAPSULE STUDY N/A 06/23/2017   Procedure: GIVENS CAPSULE STUDY;  Surgeon: Benancio Deeds, MD;  Location: [facility];  Service: Gastroenterology;  Laterality: N/A;  . LAPAROSCOPIC SALPINGOOPHERECTOMY  01/14/2011   left  . PH IMPEDANCE STUDY N/A 01/12/2018   Procedure: PH IMPEDANCE STUDY;  Surgeon: Napoleon Form, MD;  Location: [facility];  Service: Endoscopy;  Laterality: N/A;  . REPAIR PERONEAL TENDONS ANKLE  01/03/2010   repair right subluxing peroneal tendons  . RESECTION DISTAL CLAVICAL Right 09/25/2017   Procedure: RESECTION DISTAL CLAVICAL;  Surgeon: Bjorn Pippin, MD;  Location: [facility];  Service: Orthopedics;  Laterality: Right;  . RIGHT OOPHORECTOMY     with lysis of adhesions  . SHOULDER ACROMIOPLASTY Right 09/25/2017   Procedure: SHOULDER ACROMIOPLASTY;  Surgeon: Bjorn Pippin, MD;  Location: [facility];  Service: Orthopedics;  Laterality: Right;  . SHOULDER ARTHROSCOPY WITH DEBRIDEMENT AND BICEP TENDON REPAIR Right 09/25/2017   Procedure: SHOULDER ARTHROSCOPY WITH DEBRIDEMENT AND BICEP TENDON REPAIR;  Surgeon: Bjorn Pippin, MD;  Location: [facility];  Service: Orthopedics;  Laterality: Right;  . SHOULDER ARTHROSCOPY WITH ROTATOR CUFF REPAIR Right 09/25/2017    Procedure: SHOULDER ARTHROSCOPY WITH ROTATOR CUFF REPAIR;  Surgeon: Bjorn Pippin, MD;  Location:  SURGERY CENTER;  Service: Orthopedics;  Laterality: Right;  . TONSILLECTOMY  as a child  . TUBAL LIGATION  12/29/2000  . UPPER GASTROINTESTINAL ENDOSCOPY       Current Outpatient Medications  Medication Sig Dispense Refill  . albuterol (VENTOLIN HFA) 108 (90 Base) MCG/ACT inhaler Inhale 2 puffs into the lungs every 6 (six) hours as needed for wheezing or shortness of breath. 1 each 11  . cetirizine (ZYRTEC) 10 MG tablet Take 1 tablet (10 mg total) by mouth daily as needed for rhinitis. 30 tablet 5  . EPINEPHrine (EPIPEN 2-PAK) 0.3 mg/0.3 mL IJ SOAJ injection Inject 0.3 mLs (0.3 mg total) into the muscle as needed for anaphylaxis. 2 each 2  . esomeprazole (NEXIUM) 40 MG capsule  Take 1 capsule (40 mg total) by mouth daily at 12 noon. 30 capsule 6  . famotidine (PEPCID) 20 MG tablet Take 20 mg by mouth daily.    . fluticasone furoate-vilanterol (BREO ELLIPTA) 200-25 MCG/INH AEPB Inhale 1 puff into the lungs daily. 30 each 5  . Polysaccharide Iron Complex 125 MG/5ML LIQD Take 125 mg by mouth daily. 180 mL 1  . RESTASIS 0.05 % ophthalmic emulsion 1 drop 2 (two) times daily.    . sucralfate (CARAFATE) 1 g tablet Take 1 tablet (1 g total) by mouth every 6 (six) hours. Slowly dissolve tablet in 1 Tablespoon of distilled water before ingestion 90 tablet 2  . valACYclovir (VALTREX) 1000 MG tablet Take 1 tablet (1,000 mg total) by mouth 2 (two) times daily. 20 tablet 0  . Vitamin D, Ergocalciferol, (DRISDOL) 1.25 MG (50000 UNIT) CAPS capsule 1 tab q wed and 1 po qd sun 8 capsule 0  . zolpidem (AMBIEN CR) 12.5 MG CR tablet Take 12.5 mg by mouth at bedtime as needed.     No current facility-administered medications for this visit.    Allergies:   Aspirin, Ibuprofen, and Salmon [fish allergy]    Social History:  The patient  reports that she has never smoked. She has never used smokeless tobacco.  She reports current alcohol use. She reports that she does not use drugs.   Family History:  The patient's family history includes Asthma in her daughter and son; Dementia in her father; Diabetes in her mother; Healthy in her daughter and son; Heart attack in her maternal grandmother; Hyperlipidemia in her mother; Hypertension in her mother; Sleep apnea in her mother; Stroke in her maternal grandfather; Thyroid disease in her sister.    ROS:  Please see the history of present illness.   Otherwise, review of systems are positive for feet cramping.   All other systems are reviewed and negative.    PHYSICAL EXAM: VS:  BP 100/62   Pulse 67   Temp (!) 96.1 F (35.6 C)   Ht 5\' 4"  (1.626 m)   Wt 225 lb 12.8 oz (102.4 kg)   LMP 12/27/2003   SpO2 97%   BMI 38.76 kg/m  , BMI Body mass index is 38.76 kg/m. GENERAL:  Well appearing HEENT: Pupils equal round and reactive, fundi not visualized, oral mucosa unremarkable NECK:  No jugular venous distention, waveform within normal limits, carotid upstroke brisk and symmetric, no bruits LUNGS:  Clear to auscultation bilaterally HEART:  RRR.  PMI not displaced or sustained,S1 and S2 within normal limits, no S3, no S4, no clicks, no rubs, no murmurs ABD:  Flat, positive bowel sounds normal in frequency in pitch, no bruits, no rebound, no guarding, no midline pulsatile mass, no hepatomegaly, no splenomegaly EXT:  2 plus pulses throughout, no edema, no cyanosis no clubbing SKIN:  No rashes no nodules NEURO:  Cranial nerves II through XII grossly intact, motor grossly intact throughout PSYCH:  Cognitively intact, oriented to person place and time    EKG:  EKG is not ordered today. The ekg ordered 01/14/17 demonstrates sinus rhythm. Rate 70 bpm. 09/09/2017: Sinus rhythm.  Rate 63 bpm.  Incomplete right bundle branch block.  ETT 01/24/17:   Blood pressure demonstrated a normal response to exercise.  There was no ST segment deviation noted during  stress.   1. Good exercise tolerance.  2. No evidence for ischemia by ST segment analysis.   PFTs 03/12/17: Normal.  Recent Labs: 01/04/2020: TSH 0.872 03/07/2020:  ALT 25; BUN 11; Creatinine 0.87; Hemoglobin 11.2; Platelets 356; Potassium 3.8; Sodium 139    Lipid Panel    Component Value Date/Time   CHOL 223 (H) 01/04/2020 1046   TRIG 80 01/04/2020 1046   HDL 62 01/04/2020 1046   CHOLHDL 2.9 06/17/2019 1227   CHOLHDL 3.2 12/06/2015 1153   VLDL 13 12/06/2015 1153   LDLCALC 147 (H) 01/04/2020 1046      Wt Readings from Last 3 Encounters:  03/14/20 225 lb 12.8 oz (102.4 kg)  03/08/20 224 lb (101.6 kg)  03/07/20 230 lb 3.2 oz (104.4 kg)     ASSESSMENT AND PLAN:  # Chest pain:  [name] symptoms are not due to ischemia.  Her symptoms have resolved.  She had a negative stress test and a coronary calcium score of 0.  If she has chest pain in the future consider noncardiac causes.    # Exertional dyspnea:  We will get an echo.  She appears euvolemic on exam.  # Palpitations: # Sinus tachycardia:  [name] has episodes of tachycardia that were seen to be sinus tachycardia and occasional PVCs on her ambulatory monitor.  It is unclear why her heart rate is going so fast at rest.  Her thyroid function has been normal.  She was only mildly anemic.  We did try metoprolol but she did not tolerate it blood pressure is too low to tolerate diltiazem.  She noticed that prednisone and her inhaler are triggers.  She tries to limit their use.  Overall this seems to be bothering her less lately.  # Apnea: # Daytime somnolence: Sleep study was positive for sleep apnea but not severe enough to require CPAP.  Referring to prep for exercise and weight loss as above.   Current medicines are reviewed at length with the patient today.  The patient does not have concerns regarding medicines.  The following changes have been made:  Prn metoprolol.   Labs/ tests ordered today include:    Orders Placed This Encounter  Procedures  . ECHOCARDIOGRAM COMPLETE     Disposition:   FU with Kaylea Mounsey C. Duke Salviaandolph, MD, [facility] in 6 months.      Signed, Cleven Jansma C. Duke Salviaandolph, MD, [facility]  03/14/2020 4:52 PM    [facility]

## 2020-03-14 NOTE — Patient Instructions (Signed)
Medication Instructions:  Your physician recommends that you continue on your current medications as directed. Please refer to the Current Medication list given to you today.  *If you need a refill on your cardiac medications before your next appointment, please call your pharmacy*  Lab Work: NONE  Testing/Procedures: Your physician has requested that you have an echocardiogram. Echocardiography is a painless test that uses sound waves to create images of your heart. It provides your doctor with information about the size and shape of your heart and how well your heart's chambers and valves are working. This procedure takes approximately one hour. There are no restrictions for this procedure. [facility] AT [address]   Follow-Up: At [facility], you and your health needs are our priority.  As part of our continuing mission to provide you with exceptional heart care, we have created designated Provider Care Teams.  These Care Teams include your primary Cardiologist (physician) and Advanced Practice Providers (APPs -  Physician Assistants and Nurse Practitioners) who all work together to provide you with the care you need, when you need it.  We recommend signing up for the patient portal called "MyChart".  Sign up information is provided on this After Visit Summary.  MyChart is used to connect with patients for Virtual Visits (Telemedicine).  Patients are able to view lab/test results, encounter notes, upcoming appointments, etc.  Non-urgent messages can be sent to your provider as well.   To learn more about what you can do with MyChart, go to [email or web address].    Your next appointment:   6 month(s)  You will receive a reminder letter in the mail two months in advance. If you don't receive a letter, please call our office to schedule the follow-up appointment.  The format for your next appointment:   In Person  Provider:   You may see Chilton Si, MD or one of  the following Advanced Practice Providers on your designated Care Team:    Corine Shelter, PA-C  [place], [place]  Edd Fabian, FNP   WILL HAVE SOMEONE FROM PREP TEAM CALL YOU

## 2020-03-15 ENCOUNTER — Ambulatory Visit (INDEPENDENT_AMBULATORY_CARE_PROVIDER_SITE_OTHER): Payer: 59 | Admitting: Orthopaedic Surgery

## 2020-03-15 ENCOUNTER — Other Ambulatory Visit: Payer: Self-pay

## 2020-03-15 DIAGNOSIS — M25511 Pain in right shoulder: Secondary | ICD-10-CM

## 2020-03-15 DIAGNOSIS — G8929 Other chronic pain: Secondary | ICD-10-CM

## 2020-03-15 DIAGNOSIS — M542 Cervicalgia: Secondary | ICD-10-CM | POA: Diagnosis not present

## 2020-03-15 LAB — ALPHA-THALASSEMIA GENOTYPR

## 2020-03-15 NOTE — Progress Notes (Signed)
Office Visit Note   Patient: [name]           Date of Birth: 03/26/1976           MRN: [id number] Visit Date: 03/15/2020              Requested by: [name], FNP [address] [place],  [place] [address] PCP: [name], FNP   Assessment & Plan: Visit Diagnoses:  1. Chronic right shoulder pain   2. Neck pain     Plan: Impression is chronic shoulder and neck pain without structural findings seen on recent MRIs.  She has not had relief from subacromial or glenohumeral cortisone injections either.  I believe this may be autoimmune related.  In the meantime, she would like to try a course of physical therapy which I have sent in an internal referral.  She will follow up with [place] as needed.  Follow-Up Instructions: Return if symptoms worsen or fail to improve.   Orders:  Orders Placed This Encounter  Procedures  . Ambulatory referral to Physical Therapy   No orders of the defined types were placed in this encounter.     Procedures: No procedures performed   Clinical Data: No additional findings.   Subjective: Chief Complaint  Patient presents with  . Right Shoulder - Pain  . Neck - Pain    HPI patient is a pleasant 44 year old female who comes in today with continued right shoulder and neck pain.  We have seen her several times for this.  She has had 2 previous right shoulder surgeries without significant relief of symptoms.  She has had subsequent subacromial and glenohumeral cortisone injections without much relief.  MRI of the cervical spine and shoulder were obtained back in August of this year.  Neck MRI showed minimal cervical spine changes without any disc bulge or spinal canal or neural foraminal narrowing.  Right shoulder MRI showed mild supraspinatus tendinopathy.  No other findings.  She has been seeing Dr. Titus Dubin as she is previously had a positive ANA with sicca symptoms.  She is currently being referred to [facility] rheumatology for  further work-up.  Review of Systems as detailed in HPI.  All others reviewed and are negative.   Objective: Vital Signs: LMP 12/27/2003   Physical Exam well-developed well-nourished female no acute distress.  Alert oriented x3.  Ortho Exam full and painless range of motion of her right shoulder.  Specialty Comments:  No specialty comments available.  Imaging: No new imaging   PMFS History: Patient Active Problem List   Diagnosis Date Noted  . Exertional dyspnea 03/14/2020  . Neuropathy, right side of face 03/08/2020  . At risk for depression 03/08/2020  . Primary osteoarthritis of both feet 02/26/2020  . Vitamin D deficiency, unspecified 01/26/2020  . Hyperlipidemia 01/26/2020  . Prediabetes 01/26/2020  . At risk for heart disease 01/26/2020  . Daytime somnolence 01/26/2020  . Vitamin D deficiency 01/04/2020  . Fatty liver 01/04/2020  . B12 deficiency 01/04/2020  . Moderate persistent asthma without complication 12/31/2019  . Inappropriate sinus tachycardia 12/08/2019  . Class 3 severe obesity with serious comorbidity and body mass index (BMI) of 40.0 to 44.9 in adult (HCC) 12/08/2019  . Apnea 12/08/2019  . Anaphylactic reaction due to food, subsequent encounter 11/22/2019  . Allergic rhinoconjunctivitis 11/22/2019  . Idiopathic urticaria 11/22/2019  . Generalized anxiety disorder 04/08/2018  . Atypical chest pain   . Regurgitation of food   . Laryngopharyngeal reflux (LPR)  07/30/2017  . Throat soreness 07/30/2017  . ANA positive 12/30/2016  . Gastroesophageal reflux disease 06/27/2016  . Disturbance of skin sensation 04/27/2014  . Dizziness and giddiness 03/14/2014  . Headache, migraine 03/14/2014  . Myalgia and myositis, unspecified 06/07/2013  . Nausea and vomiting in adult 08/22/2012  . Viral syndrome 08/22/2012  . Pelvic pain in female 12/18/2010  . PID (acute pelvic inflammatory disease) 12/18/2010  . ANEMIA-IRON DEFICIENCY 10/15/2006  . HERPES, GENITAL  NOS 09/30/2006  . Obesity (BMI 35.0-39.9 without comorbidity) 09/30/2006  . DISORDER, NONORGANIC SLEEP NOS 09/30/2006  . CONSTIPATION NOS 09/30/2006  . NEURITIS, LUMBOSACRAL NOS 09/30/2006  . HX, PERSONAL, MENTAL DISORDER NOS 09/30/2006   Past Medical History:  Diagnosis Date  . Allergy   . Anemia   . Angio-edema   . Anxiety   . Apnea 12/08/2019  . Arthritis   . Asthma   . Back pain   . Constipation   . Exertional dyspnea 03/14/2020  . Fatty liver   . GERD (gastroesophageal reflux disease)   . Hx of endometriosis   . Inappropriate sinus tachycardia 12/08/2019  . Insomnia   . Joint pain   . Lactose intolerance   . Macromastia 07/2011  . Migraine   . Morbid obesity (HCC) 12/08/2019  . Osteoporosis   . Urticaria   . Vitamin D deficiency     Family History  Problem Relation Age of Onset  . Diabetes Mother   . Hypertension Mother   . Hyperlipidemia Mother   . Sleep apnea Mother   . Thyroid disease Sister   . Dementia Father   . Heart attack Maternal Grandmother   . Stroke Maternal Grandfather   . Asthma Son   . Healthy Son   . Asthma Daughter   . Healthy Daughter   . Colon cancer Neg Hx   . Colon polyps Neg Hx   . Esophageal cancer Neg Hx   . Rectal cancer Neg Hx   . Stomach cancer Neg Hx     Past Surgical History:  Procedure Laterality Date  . 24 HOUR PH STUDY N/A 01/12/2018   Procedure: 24 HOUR PH STUDY;  Surgeon: Napoleon Form, MD;  Location: [facility];  Service: Endoscopy;  Laterality: N/A;  . ABDOMINAL HYSTERECTOMY  12/12/2003  . ADENOIDECTOMY  05/2011  . ANKLE ARTHROSCOPY  12/29/2007   right; with extensive debridement  . BLADDER NECK RECONSTRUCTION  01/14/2011   Procedure: BLADDER NECK REPAIR;  Surgeon: Reva Bores, MD;  Location: [facility];  Service: Gynecology;  Laterality: N/A;  Laparoscopic Repair of Incidental Cystotomy  . BREAST REDUCTION SURGERY  08/05/2011   Procedure: MAMMARY REDUCTION  (BREAST);  Surgeon: Louisa Second, MD;  Location: [facility];  Service: Plastics;  Laterality: Bilateral;  bilateral  . BREAST SURGERY  2012   breast reduction  . CESAREAN SECTION  12/29/2000; 1994  . DILATION AND CURETTAGE OF UTERUS  07/08/2003   open laparoscopy  . ESOPHAGEAL MANOMETRY N/A 01/12/2018   Procedure: ESOPHAGEAL MANOMETRY (EM);  Surgeon: Napoleon Form, MD;  [facility]: [facility];  Service: Endoscopy;  Laterality: N/A;  . GIVENS CAPSULE STUDY N/A 06/23/2017   Procedure: GIVENS CAPSULE STUDY;  Surgeon: Benancio Deeds, MD;  Location[facility];  Service: Gastroenterology;  Laterality: N/A;  . LAPAROSCOPIC SALPINGOOPHERECTOMY  01/14/2011   left  . PH IMPEDANCE STUDY N/A 01/12/2018   Procedure: PH IMPEDANCE STUDY;  Surgeon: Napoleon Form, MD;  [facility]: [facility];  Service: Endoscopy;  Laterality:  N/A;  . REPAIR PERONEAL TENDONS ANKLE  01/03/2010   repair right subluxing peroneal tendons  . RESECTION DISTAL CLAVICAL Right 09/25/2017   Procedure: RESECTION DISTAL CLAVICAL;  Surgeon: Bjorn Pippin, MD;  Location: [facility];  Service: Orthopedics;  Laterality: Right;  . RIGHT OOPHORECTOMY     with lysis of adhesions  . SHOULDER ACROMIOPLASTY Right 09/25/2017   Procedure: SHOULDER ACROMIOPLASTY;  Surgeon: Bjorn Pippin, MD;  Location: [facility];  Service: Orthopedics;  Laterality: Right;  . SHOULDER ARTHROSCOPY WITH DEBRIDEMENT AND BICEP TENDON REPAIR Right 09/25/2017   Procedure: SHOULDER ARTHROSCOPY WITH DEBRIDEMENT AND BICEP TENDON REPAIR;  Surgeon: Bjorn Pippin, MD;  Location: [facility];  Service: Orthopedics;  Laterality: Right;  . SHOULDER ARTHROSCOPY WITH ROTATOR CUFF REPAIR Right 09/25/2017   Procedure: SHOULDER ARTHROSCOPY WITH ROTATOR CUFF REPAIR;  Surgeon: Bjorn Pippin, MD;  Location: [facility];  Service: Orthopedics;  Laterality: Right;  . TONSILLECTOMY  as a child  . TUBAL LIGATION  12/29/2000  . UPPER GASTROINTESTINAL ENDOSCOPY     Social  History   Occupational History  . Occupation: Facilities manager: OTHER    Comment: [employer]  Tobacco Use  . Smoking status: Never Smoker  . Smokeless tobacco: Never Used  Vaping Use  . Vaping Use: Never used  Substance and Sexual Activity  . Alcohol use: Yes    Comment: occasional   . Drug use: No  . Sexual activity: Not on file

## 2020-03-17 ENCOUNTER — Telehealth: Payer: Self-pay

## 2020-03-17 NOTE — Telephone Encounter (Signed)
VMT pt requesting call back about PREP Program.

## 2020-03-22 ENCOUNTER — Ambulatory Visit (INDEPENDENT_AMBULATORY_CARE_PROVIDER_SITE_OTHER): Payer: 59 | Admitting: Family Medicine

## 2020-03-22 ENCOUNTER — Other Ambulatory Visit: Payer: Self-pay

## 2020-03-22 ENCOUNTER — Encounter (INDEPENDENT_AMBULATORY_CARE_PROVIDER_SITE_OTHER): Payer: Self-pay | Admitting: Family Medicine

## 2020-03-22 VITALS — BP 102/69 | HR 67 | Temp 97.4°F | Ht 64.0 in | Wt 219.0 lb

## 2020-03-22 DIAGNOSIS — R0602 Shortness of breath: Secondary | ICD-10-CM

## 2020-03-22 DIAGNOSIS — Z6837 Body mass index (BMI) 37.0-37.9, adult: Secondary | ICD-10-CM

## 2020-03-22 DIAGNOSIS — E66812 Obesity, class 2: Secondary | ICD-10-CM

## 2020-03-22 DIAGNOSIS — G629 Polyneuropathy, unspecified: Secondary | ICD-10-CM

## 2020-03-22 DIAGNOSIS — R5383 Other fatigue: Secondary | ICD-10-CM

## 2020-03-24 ENCOUNTER — Other Ambulatory Visit: Payer: Self-pay | Admitting: Gastroenterology

## 2020-03-27 ENCOUNTER — Encounter: Payer: Self-pay | Admitting: Rehabilitative and Restorative Service Providers"

## 2020-03-27 ENCOUNTER — Ambulatory Visit (INDEPENDENT_AMBULATORY_CARE_PROVIDER_SITE_OTHER): Payer: 59 | Admitting: Rehabilitative and Restorative Service Providers"

## 2020-03-27 ENCOUNTER — Other Ambulatory Visit: Payer: Self-pay

## 2020-03-27 DIAGNOSIS — M542 Cervicalgia: Secondary | ICD-10-CM | POA: Diagnosis not present

## 2020-03-27 DIAGNOSIS — R293 Abnormal posture: Secondary | ICD-10-CM

## 2020-03-27 DIAGNOSIS — M25511 Pain in right shoulder: Secondary | ICD-10-CM

## 2020-03-27 DIAGNOSIS — M6281 Muscle weakness (generalized): Secondary | ICD-10-CM | POA: Diagnosis not present

## 2020-03-27 DIAGNOSIS — G8929 Other chronic pain: Secondary | ICD-10-CM

## 2020-03-27 NOTE — Therapy (Signed)
[facility] [address] [place], [place], [address] Phone: [phone]   Fax:  [phone]  Physical Therapy Evaluation  Patient Details  Name: Sheila SangerVickie S [name] MRN: [id number] Date of Birth: January 02, 1976 Referring Provider (PT): Jari SportsmanMary Stanbery PA-C   Encounter Date: 03/27/2020   PT End of Session - 03/27/20 [id number]    Visit Number 1    Number of Visits 12    Date for PT Re-Evaluation 06/05/20    Progress Note Due on Visit 10    PT Start Time 0935    PT Stop Time 1010    PT Time Calculation (min) 35 min    Activity Tolerance Patient tolerated treatment well    Behavior During Therapy [facility] for tasks assessed/performed           Past Medical History:  Diagnosis Date  . Allergy   . Anemia   . Angio-edema   . Anxiety   . Apnea 12/08/2019  . Arthritis   . Asthma   . Back pain   . Constipation   . Exertional dyspnea 03/14/2020  . Fatty liver   . GERD (gastroesophageal reflux disease)   . Hx of endometriosis   . Inappropriate sinus tachycardia 12/08/2019  . Insomnia   . Joint pain   . Lactose intolerance   . Macromastia 07/2011  . Migraine   . Morbid obesity (HCC) 12/08/2019  . Osteoporosis   . Urticaria   . Vitamin D deficiency     Past Surgical History:  Procedure Laterality Date  . 24 HOUR PH STUDY N/A 01/12/2018   Procedure: 24 HOUR PH STUDY;  Surgeon: Napoleon FormNandigam, Kavitha V, MD;  Location: [facility];  Service: Endoscopy;  Laterality: N/A;  . ABDOMINAL HYSTERECTOMY  12/12/2003  . ADENOIDECTOMY  05/2011  . ANKLE ARTHROSCOPY  12/29/2007   right; with extensive debridement  . BLADDER NECK RECONSTRUCTION  01/14/2011   Procedure: BLADDER NECK REPAIR;  Surgeon: Reva Boresanya S Pratt, MD;  Location: [facility];  Service: Gynecology;  Laterality: N/A;  Laparoscopic Repair of Incidental Cystotomy  . BREAST REDUCTION SURGERY  08/05/2011   Procedure: MAMMARY REDUCTION  (BREAST);  Surgeon: Louisa SecondGerald Truesdale, MD;  Location: [facility];  Service:  Plastics;  Laterality: Bilateral;  bilateral  . BREAST SURGERY  2012   breast reduction  . CESAREAN SECTION  12/29/2000; 1994  . DILATION AND CURETTAGE OF UTERUS  07/08/2003   open laparoscopy  . ESOPHAGEAL MANOMETRY N/A 01/12/2018   Procedure: ESOPHAGEAL MANOMETRY (EM);  Surgeon: Napoleon FormNandigam, Kavitha V, MD;  [facility];  Service: Endoscopy;  Laterality: N/A;  . GIVENS CAPSULE STUDY N/A 06/23/2017   Procedure: GIVENS CAPSULE STUDY;  Surgeon: Benancio DeedsArmbruster, Steven P, MD;  [facility];  Service: Gastroenterology;  Laterality: N/A;  . LAPAROSCOPIC SALPINGOOPHERECTOMY  01/14/2011   left  . PH IMPEDANCE STUDY N/A 01/12/2018   Procedure: PH IMPEDANCE STUDY;  Surgeon: Napoleon FormNandigam, Kavitha V, MD;  Location: [facility];  Service: Endoscopy;  Laterality: N/A;  . REPAIR PERONEAL TENDONS ANKLE  01/03/2010   repair right subluxing peroneal tendons  . RESECTION DISTAL CLAVICAL Right 09/25/2017   Procedure: RESECTION DISTAL CLAVICAL;  Surgeon: Bjorn PippinVarkey, Dax T, MD;  Location: [facility];  Service: Orthopedics;  Laterality: Right;  . RIGHT OOPHORECTOMY     with lysis of adhesions  . SHOULDER ACROMIOPLASTY Right 09/25/2017   Procedure: SHOULDER ACROMIOPLASTY;  Surgeon: Bjorn PippinVarkey, Dax T, MD;  Location: [facility];  Service: Orthopedics;  Laterality: Right;  . SHOULDER  ARTHROSCOPY WITH DEBRIDEMENT AND BICEP TENDON REPAIR Right 09/25/2017   Procedure: SHOULDER ARTHROSCOPY WITH DEBRIDEMENT AND BICEP TENDON REPAIR;  Surgeon: Bjorn Pippin, MD;  Location: [facility];  Service: Orthopedics;  Laterality: Right;  . SHOULDER ARTHROSCOPY WITH ROTATOR CUFF REPAIR Right 09/25/2017   Procedure: SHOULDER ARTHROSCOPY WITH ROTATOR CUFF REPAIR;  Surgeon: Bjorn Pippin, MD;  Location: [facility];  Service: Orthopedics;  Laterality: Right;  . TONSILLECTOMY  as a child  . TUBAL LIGATION  12/29/2000  . UPPER GASTROINTESTINAL ENDOSCOPY      There were no vitals filed for  this visit.    Subjective Assessment - 03/27/20 0933    Subjective Pt. indicated complaints of Rt shoulder/neck pain.  Pt. indicated tightness all the time and pain "hurts a lot".  Pt. stated activity like laundry and making bed, cleaning is painful.  Pt. indicated history of "a long time of trouble" for about a year or so.  Sometimes has to get off work early due to pain.    Limitations Sitting;House hold activities   driving bus   Patient Stated Goals Reduce pain    Currently in Pain? 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, household activity              [facility] PT Assessment - 03/27/20 0001      Assessment   Medical Diagnosis Cervical Radic, Rt UE    Referring Provider (PT) Jari Sportsman PA-C    Onset Date/Surgical Date 03/28/19    Hand Dominance Right    Prior Therapy Possible physical therapy after shoulder surgery in past      Precautions   Precautions None      Restrictions   Weight Bearing Restrictions No      Balance Screen   Has the patient fallen in the past 6 months No      Home Environment   Living Environment Private residence      Prior Function   Level of Independence Independent      Cognition   Overall Cognitive Status Within Functional Limits for tasks assessed      Observation/Other Assessments   Focus on Therapeutic Outcomes (FOTO)  Intake shoulder 50%, expected outcome 68%      Sensation   Light Touch Appears Intact      Posture/Postural Control   Posture Comments Rt lateral bend and Lt rotation noted in sitting posture      ROM / Strength   AROM /  PROM / Strength Strength;PROM;AROM      AROM   AROM Assessment Site Cervical;Shoulder    Cervical Flexion 35    Cervical Extension 48   tightness   Cervical - Right Rotation 70    Cervical - Left Rotation 35   Rt cervical, SCM tightness     Strength   Strength Assessment Site Cervical;Shoulder;Elbow    Right/Left Shoulder Left;Right    Right Shoulder Flexion 5/5    Right Shoulder ABduction 4/5  Right Shoulder Internal Rotation 5/5    Right Shoulder External Rotation 4/5    Left Shoulder Flexion 5/5    Left Shoulder ABduction 5/5    Left Shoulder Internal Rotation 5/5    Left Shoulder External Rotation 4+/5    Right/Left Elbow Left;Right    Right Elbow Flexion 5/5    Right Elbow Extension 5/5    Left Elbow Flexion 5/5    Left Elbow Extension 5/5      Palpation   Palpation comment Tenderness and Trigger points noted in Rt upper trap, SCM, levator, infraspinatus, cervical paraspinals on Rt      Special Tests   Other special tests (-) Spurlings.  + myofascial mobility deficits for muscle length in upper trap, levator and SCM on Rt   No indications of radicular symptoms into Rt UE                     Objective measurements completed on examination: See above findings.       [facility] Adult PT Treatment/Exercise - 03/27/20 0001      Exercises   Exercises Other Exercises;Neck    Other Exercises  HEP instruction/performance per handout c cues for techniques. Trial of 3 stretches per exercise(levator, upper trap, SCM on Rt for 15 seconds) c x 10 scap retractions      Modalities   Modalities Moist Heat (P)       Manual Therapy   Manual therapy comments compression to Rt upper trap            Trigger Point Dry Needling - 03/27/20 0001    Consent Given? Yes    Muscles Treated Head and Neck Upper trapezius   Rt   Upper Trapezius Response Twitch reponse elicited                PT Education - 03/27/20 0923    Education Details HEP, POC, DN    Person(s)  Educated Patient    Methods Explanation;Demonstration;Handout;Verbal cues    Comprehension Returned demonstration;Verbalized understanding            PT Short Term Goals - 03/27/20 0923      PT SHORT TERM GOAL #1   Title Patient will demonstrate independent use of home exercise program to maintain progress from in clinic treatments.    Time 3    Period Weeks    Status New    Target Date 04/17/20             PT Long Term Goals - 03/27/20 0923      PT LONG TERM GOAL #1   Title Patient will demonstrate/report pain at worst less than or equal to 2/10 to facilitate minimal limitation in daily activity secondary to pain symptoms.    Time 10    Period Weeks    Status New    Target Date 06/05/20      PT LONG TERM GOAL #2   Title Patient will demonstrate independent use of home exercise program to facilitate ability to maintain/progress functional gains from skilled physical therapy services.    Time 10    Period Weeks    Status New    Target Date 06/05/20      PT LONG TERM GOAL #3   Title Patient will demonstrate cervical AROM WFL s symptoms to facilitate daily activity including driving, self care at PLOF s limitation due to symptoms.    Time 10    Period Weeks    Status  New    Target Date 06/05/20      PT LONG TERM GOAL #4   Title Pt. will demonstrate Rt UE MMT 5/5 throughout to facilitate normalized movement patterns for improved daily use s symptoms.    Time 10    Period Weeks    Status New    Target Date 06/05/20      PT LONG TERM GOAL #5   Title Pt. will demonstrate/report abilty to work, perform housework at [employer] c FOTO 68% percent or greater.    Time 10    Period Weeks    Status New    Target Date 06/05/20                  Plan - 03/27/20 9211    Clinical Impression Statement Patient is a 44 y.o. female who comes to clinic with complaints of cervical pain, Rt UE pain with mobility, strength and movement coordination deficits that impair their  ability to perform usual daily and recreational functional activities without increase difficulty/symptoms at this time.  Patient to benefit from skilled PT services to address impairments and limitations to improve to previous level of function without restriction secondary to condition.    Personal Factors and Comorbidities Comorbidity 3+    Comorbidities Rt shoulder scope and RTC repair 2019, anx,obesity, OA    Examination-Activity Limitations Sit;Sleep;Carry;Lift    Examination-Participation Restrictions Yard Work;Community Activity;Cleaning;Occupation;Meal Prep;Laundry;Driving    Stability/Clinical Decision Making Stable/Uncomplicated    Clinical Decision Making Low    Rehab Potential Good    PT Frequency --   1-2x/week   PT Duration --   10 weeks   PT Treatment/Interventions ADLs/Self Care Home Management;Cryotherapy;Electrical Stimulation;Iontophoresis 4mg /ml Dexamethasone;Moist Heat;Traction;Balance training;Therapeutic exercise;Therapeutic activities;Functional mobility training;Stair training;Gait training;Ultrasound;Neuromuscular re-education;Patient/family education;Manual techniques;Taping;Dry needling;Passive range of motion;Spinal Manipulations;Joint Manipulations    PT Next Visit Plan Reassess HEP, DN prn, cervical mobility gains, postural strengthening    PT Home Exercise Plan [email or web address]    Consulted and Agree with Plan of Care Patient           Patient will benefit from skilled therapeutic intervention in order to improve the following deficits and impairments:  Decreased endurance, Hypomobility, Decreased activity tolerance, Decreased strength, Increased fascial restricitons, Impaired UE functional use, Pain, Increased muscle spasms, Decreased mobility, Decreased range of motion, Impaired perceived functional ability, Improper body mechanics, Postural dysfunction, Impaired flexibility  Visit Diagnosis: Cervicalgia  Chronic right shoulder pain  Muscle weakness  (generalized)  Abnormal posture     Problem List Patient Active Problem List   Diagnosis Date Noted  . Other fatigue 03/22/2020  . SOBOE (shortness of breath on exertion) 03/22/2020  . Exertional dyspnea 03/14/2020  . Neuropathy, right side of face 03/08/2020  . At risk for depression 03/08/2020  . Primary osteoarthritis of both feet 02/26/2020  . Vitamin D deficiency, unspecified 01/26/2020  . Hyperlipidemia 01/26/2020  . Prediabetes 01/26/2020  . At risk for heart disease 01/26/2020  . Daytime somnolence 01/26/2020  . Vitamin D deficiency 01/04/2020  . Fatty liver 01/04/2020  . B12 deficiency 01/04/2020  . Moderate persistent asthma without complication 12/31/2019  . Inappropriate sinus tachycardia 12/08/2019  . Class 3 severe obesity with serious comorbidity and body mass index (BMI) of 40.0 to 44.9 in adult (HCC) 12/08/2019  . Apnea 12/08/2019  . Anaphylactic reaction due to food, subsequent encounter 11/22/2019  . Allergic rhinoconjunctivitis 11/22/2019  . Idiopathic urticaria 11/22/2019  . Generalized anxiety disorder 04/08/2018  . Atypical chest pain   . Regurgitation  of food   . Laryngopharyngeal reflux (LPR) 07/30/2017  . Throat soreness 07/30/2017  . ANA positive 12/30/2016  . Gastroesophageal reflux disease 06/27/2016  . Disturbance of skin sensation 04/27/2014  . Dizziness and giddiness 03/14/2014  . Headache, migraine 03/14/2014  . Myalgia and myositis, unspecified 06/07/2013  . Nausea and vomiting in adult 08/22/2012  . Viral syndrome 08/22/2012  . Pelvic pain in female 12/18/2010  . PID (acute pelvic inflammatory disease) 12/18/2010  . ANEMIA-IRON DEFICIENCY 10/15/2006  . HERPES, GENITAL NOS 09/30/2006  . Obesity (BMI 35.0-39.9 without comorbidity) 09/30/2006  . DISORDER, NONORGANIC SLEEP NOS 09/30/2006  . CONSTIPATION NOS 09/30/2006  . NEURITIS, LUMBOSACRAL NOS 09/30/2006  . HX, PERSONAL, MENTAL DISORDER NOS 09/30/2006    Chyrel Masson, PT, DPT,  OCS, ATC 03/27/20  10:42 AM    [facility] Physical Therapy [address] [place], [place], [address] Phone: [phone]   Fax:  [phone]  Name: YELENA METZER MRN: [id number] Date of Birth: 04/22/1976

## 2020-03-27 NOTE — Patient Instructions (Signed)
Access Code: [email or web address] URL: [email or web address] Date: 03/27/2020 Prepared by: Chyrel Masson  Exercises Gentle Levator Scapulae Stretch - 2 x daily - 7 x weekly - 1 sets - 5 reps - 15 hold Seated Gentle Upper Trapezius Stretch - 2 x daily - 7 x weekly - 1 sets - 5 reps - 15 hold Seated Scapular Retraction - 2 x daily - 7 x weekly - 2 sets - 10 reps - 1-2 hold Sternocleidomastoid Stretch - 2 x daily - 7 x weekly - 1 sets - 5 reps - 15 hold

## 2020-03-28 NOTE — Progress Notes (Signed)
Chief Complaint:   OBESITY [name] is here to discuss her progress with her obesity treatment plan along with follow-up of her obesity related diagnoses. [name] is on the Category 2 Plan and states she is following her eating plan approximately 80% of the time. [name] states she is exercising 0 minutes 0 times per week.  Today's visit was #: 6 Starting weight: 239 lbs Starting date: 01/04/2020 Today's weight: 219 lbs Today's date: 03/22/2020 Total lbs lost to date: 20 lbs Total lbs lost since last in-office visit: 5 lbs Total weight loss percentage to date: -8.37%  Interim History: [name] identifies multiple barriers to her being successful today. Her SOB and fatigue inhibits her from exercise, although her cardiologist has cleared her.  She states that her nerve pain causes significant discomfort and inhibits her from enjoying activities. She also notes that buying healthy foods is "getting too expensive".    -[name] also states she has had increased hunger the last week or so and states she is eating on the plan and not missing foods.  Evidence-based interventions for health behavior change were utilized today including the discussion of self monitoring techniques, problem-solving barriers and SMART goal setting techniques.     Assessment/Plan:     1. Other fatigue [name] denies increased symptoms from prior. Cardiology has cleared her from the cardiac standpoint.  Plan: Bionca will start exercise program as recommended by her cardiologist.    2. SOBOE (shortness of breath on exertion) [name] identifies several barriers to exercise and is worried about her heart.  Denies increased symptoms from prior. Cardiology has cleared her from the cardiac standpoint.  Plan: [name] will start exercise program as recommended by her cardiologist.    3. Neuropathy [name] denies worsening symptoms. She states she has not been able to reach her neurologist for a follow up  appointment.  Plan: Treatment plan, per specialist.   4. Class 2 severe obesity with serious comorbidity and body mass index (BMI) of 37.0 to 37.9 in adult, unspecified obesity type (HCC) [name] is currently in the action stage of change. As such, her goal is to continue with weight loss efforts. She has agreed to change from CAT 2 to the Category 3 Plan with breakfast options.   Exercise goals: [name] will start exercise program through her cardiologist. She hasn't done this for several weeks now.  Behavioral modification strategies: meal planning and cooking strategies, emotional eating strategies, holiday eating strategies  and celebration eating strategies.  [name] has agreed to follow-up with our clinic in 2 weeks. She was informed of the importance of frequent follow-up visits to maximize her success with intensive lifestyle modifications for her multiple health conditions.    Objective:   Blood pressure 102/69, pulse 67, temperature (!) 97.4 F (36.3 C), height 5\' 4"  (1.626 m), weight 219 lb (99.3 kg), last menstrual period 12/27/2003, SpO2 100 %. Body mass index is 37.59 kg/m.  General: Cooperative, alert, well developed, in no acute distress. HEENT: Conjunctivae and lids unremarkable. Cardiovascular: Regular rhythm.  Lungs: Normal work of breathing. Neurologic: No focal deficits.   Lab Results  Component Value Date   CREATININE 0.87 03/07/2020   BUN 11 03/07/2020   NA 139 03/07/2020   K 3.8 03/07/2020   CL 103 03/07/2020   CO2 29 03/07/2020   Lab Results  Component Value Date   ALT 25 03/07/2020   AST 17 03/07/2020   ALKPHOS 45 03/07/2020   BILITOT 0.3 03/07/2020   Lab Results  Component Value Date   HGBA1C 5.7 (H) 01/04/2020   HGBA1C 5.7 (H) 06/17/2019   HGBA1C 5.1 12/24/2017   HGBA1C 5.1 06/16/2017   Lab Results  Component Value Date   INSULIN 8.0 01/04/2020   Lab Results  Component Value Date   TSH 0.872 01/04/2020   Lab Results  Component Value  Date   CHOL 223 (H) 01/04/2020   HDL 62 01/04/2020   LDLCALC 147 (H) 01/04/2020   TRIG 80 01/04/2020   CHOLHDL 2.9 06/17/2019   Lab Results  Component Value Date   WBC 7.5 03/07/2020   HGB 11.2 (L) 03/07/2020   HCT 36.4 03/07/2020   MCV 79.3 (L) 03/07/2020   PLT 356 03/07/2020   Lab Results  Component Value Date   IRON 32 (L) 03/07/2020   TIBC 343 03/07/2020   FERRITIN 48 03/07/2020    Attestation Statements:   Reviewed by clinician on day of visit: allergies, medications, problem list, medical history, surgical history, family history, social history, and previous encounter notes.  Time spent on visit including pre-visit chart review and post-visit care and charting was 30 minutes.   Edmund Hilda, am acting as Energy manager for [employer], DO.  I have reviewed the above documentation for accuracy and completeness, and I agree with the above. Carlye Grippe, D.O.  The 21st Century Cures Act was signed into law in 2016 which includes the topic of electronic health records.  This provides immediate access to information in MyChart.  This includes consultation notes, operative notes, office notes, lab results and pathology reports.  If you have any questions about what you read please let us know at your next visit so we can discuss your concerns and take corrective action if need be.  We are right here with you.

## 2020-03-29 ENCOUNTER — Encounter: Payer: Self-pay | Admitting: Nurse Practitioner

## 2020-04-04 ENCOUNTER — Ambulatory Visit (INDEPENDENT_AMBULATORY_CARE_PROVIDER_SITE_OTHER): Payer: 59 | Admitting: Family Medicine

## 2020-04-04 ENCOUNTER — Other Ambulatory Visit: Payer: Self-pay

## 2020-04-04 ENCOUNTER — Encounter (INDEPENDENT_AMBULATORY_CARE_PROVIDER_SITE_OTHER): Payer: Self-pay | Admitting: Family Medicine

## 2020-04-04 VITALS — BP 106/70 | HR 68 | Temp 97.7°F | Ht 64.0 in | Wt 215.0 lb

## 2020-04-04 DIAGNOSIS — Z9189 Other specified personal risk factors, not elsewhere classified: Secondary | ICD-10-CM

## 2020-04-04 DIAGNOSIS — E559 Vitamin D deficiency, unspecified: Secondary | ICD-10-CM | POA: Diagnosis not present

## 2020-04-04 DIAGNOSIS — Z6836 Body mass index (BMI) 36.0-36.9, adult: Secondary | ICD-10-CM

## 2020-04-04 MED ORDER — VITAMIN D (ERGOCALCIFEROL) 1.25 MG (50000 UNIT) PO CAPS: ORAL_CAPSULE | ORAL | 0 refills | Status: DC

## 2020-04-05 NOTE — Progress Notes (Signed)
Chief Complaint:   OBESITY [name] is here to discuss her progress with her obesity treatment plan along with follow-up of her obesity related diagnoses. [name] is on the Category 3 Plan with breakfast options, but is eating category 2 and states she is following her eating plan approximately 80% of the time. [name] states she is walking 20 minutes 2 times per week.  Today's visit was #: 7 Starting weight: 239 lbs Starting date: 01/04/2020 Today's weight: 215 lbs Today's date: 04/04/2020 Total lbs lost to date: 24 lbs Total lbs lost since last in-office visit: 4 lbs Total weight loss percentage to date: -10.04%  Interim History: [name]'s meal plan was changed at her last office visit.We changed from the category 2 plan to category 3 but she tells me that she can't eat that amount of food, therefore she was unable to follow the new plan. She has also started walking since she was last seen.  Assessment/Plan:   1. Vitamin D deficiency [name]'s Vitamin D level was 75.2 on 01/04/2020. She is currently taking prescription vitamin D 50,000 IU each week. She denies nausea, vomiting or muscle weakness.  Plan: Refill Vit D for 1 month, as per below.  - Reiterated importance of vitamin D (as well as calcium) to their health and wellbeing.  - Reminded [name] that weight loss will likely improve availability of vitamin D, thus encouraged her to continue with meal plan and their weight loss efforts to further improve this condition. - I recommend patient continue to take weekly prescription vit D 50,000 IU - Informed patient this may be a lifelong thing, and she was encouraged to continue to take the medicine until told otherwise.   - we will need to monitor levels regularly (every 3-4 mo on average) to keep levels within normal limits.  - weight loss will likely improve availability of vitamin D, thus encouraged [name] to continue with meal plan and their weight loss efforts to further  improve this condition - pt's questions and concerns regarding this condition addressed.  Refill- Vitamin D, Ergocalciferol, (DRISDOL) 1.25 MG (50000 UNIT) CAPS capsule; 1 tab q wed and 1 po qd sun  Dispense: 8 capsule; Refill: 0  2. At risk for impaired metabolic function [name] was given approximately 15 minutes of impaired  metabolic function prevention counseling today. We discussed intensive lifestyle modifications today with an emphasis on specific nutrition and exercise instructions and strategies.   3. Class 2 severe obesity with serious comorbidity and body mass index (BMI) of 36.0 to 36.9 in adult, unspecified obesity type (HCC) [name] is currently in the action stage of change. As such, her goal is to continue with weight loss efforts. She has agreed to the Category 3 Plan with breakfast options.   Exercise goals: Increase exercise as tolerated  Behavioral modification strategies: increasing lean protein intake, meal planning and cooking strategies, better snacking choices, emotional eating strategies, avoiding temptations and planning for success.  [name] has agreed to follow-up with our clinic in 2 weeks. She was informed of the importance of frequent follow-up visits to maximize her success with intensive lifestyle modifications for her multiple health conditions.   Objective:   Blood pressure 106/70, pulse 68, temperature 97.7 F (36.5 C), height 5\' 4"  (1.626 m), weight 215 lb (97.5 kg), last menstrual period 12/27/2003, SpO2 95 %. Body mass index is 36.9 kg/m.  General: Cooperative, alert, well developed, in no acute distress. HEENT: Conjunctivae and lids unremarkable. Cardiovascular: Regular rhythm.  Lungs: Normal work of breathing. Neurologic: No focal deficits.   Lab Results  Component Value Date   CREATININE 0.87 03/07/2020   BUN 11 03/07/2020   NA 139 03/07/2020   K 3.8 03/07/2020   CL 103 03/07/2020   CO2 29 03/07/2020   Lab Results  Component Value Date    ALT 25 03/07/2020   AST 17 03/07/2020   ALKPHOS 45 03/07/2020   BILITOT 0.3 03/07/2020   Lab Results  Component Value Date   HGBA1C 5.7 (H) 01/04/2020   HGBA1C 5.7 (H) 06/17/2019   HGBA1C 5.1 12/24/2017   HGBA1C 5.1 06/16/2017   Lab Results  Component Value Date   INSULIN 8.0 01/04/2020   Lab Results  Component Value Date   TSH 0.872 01/04/2020   Lab Results  Component Value Date   CHOL 223 (H) 01/04/2020   HDL 62 01/04/2020   LDLCALC 147 (H) 01/04/2020   TRIG 80 01/04/2020   CHOLHDL 2.9 06/17/2019   Lab Results  Component Value Date   WBC 7.5 03/07/2020   HGB 11.2 (L) 03/07/2020   HCT 36.4 03/07/2020   MCV 79.3 (L) 03/07/2020   PLT 356 03/07/2020   Lab Results  Component Value Date   IRON 32 (L) 03/07/2020   TIBC 343 03/07/2020   FERRITIN 48 03/07/2020   Attestation Statements:   Reviewed by clinician on day of visit: allergies, medications, problem list, medical history, surgical history, family history, social history, and previous encounter notes.  Edmund Hilda, am acting as Energy manager for [employer], DO..  I have reviewed the above documentation for accuracy and completeness, and I agree with the above. Carlye Grippe, D.O.  The 21st Century Cures Act was signed into law in 2016 which includes the topic of electronic health records.  This provides immediate access to information in MyChart.  This includes consultation notes, operative notes, office notes, lab results and pathology reports.  If you have any questions about what you read please let us know at your next visit so we can discuss your concerns and take corrective action if need be.  We are right here with you.

## 2020-04-06 LAB — HM DIABETES EYE EXAM

## 2020-04-07 ENCOUNTER — Ambulatory Visit (INDEPENDENT_AMBULATORY_CARE_PROVIDER_SITE_OTHER): Payer: 59 | Admitting: Rehabilitative and Restorative Service Providers"

## 2020-04-07 ENCOUNTER — Encounter: Payer: Self-pay | Admitting: Rehabilitative and Restorative Service Providers"

## 2020-04-07 ENCOUNTER — Other Ambulatory Visit: Payer: Self-pay

## 2020-04-07 DIAGNOSIS — M25511 Pain in right shoulder: Secondary | ICD-10-CM | POA: Diagnosis not present

## 2020-04-07 DIAGNOSIS — M546 Pain in thoracic spine: Secondary | ICD-10-CM

## 2020-04-07 DIAGNOSIS — R293 Abnormal posture: Secondary | ICD-10-CM

## 2020-04-07 DIAGNOSIS — M542 Cervicalgia: Secondary | ICD-10-CM

## 2020-04-07 DIAGNOSIS — M6281 Muscle weakness (generalized): Secondary | ICD-10-CM

## 2020-04-07 DIAGNOSIS — G8929 Other chronic pain: Secondary | ICD-10-CM

## 2020-04-07 NOTE — Therapy (Signed)
[facility] [address] [place], [place], [address] Phone: [phone]   Fax:  [phone]  Physical Therapy Treatment  Patient Details  Name: [name] MRN: [id number] Date of Birth: [birth year] Referring Provider (PT): Jari Sportsman PA-C   Encounter Date: 04/07/2020   PT End of Session - 04/07/20 1611    Visit Number 2    Number of Visits 12    Date for PT Re-Evaluation 06/05/20    Progress Note Due on Visit 10    PT Start Time 1604    PT Stop Time 1643    PT Time Calculation (min) 39 min    Activity Tolerance Patient tolerated treatment well    Behavior During Therapy [facility] for tasks assessed/performed           Past Medical History:  Diagnosis Date  . Allergy   . Anemia   . Angio-edema   . Anxiety   . Apnea 12/08/2019  . Arthritis   . Asthma   . Back pain   . Constipation   . Exertional dyspnea 03/14/2020  . Fatty liver   . GERD (gastroesophageal reflux disease)   . Hx of endometriosis   . Inappropriate sinus tachycardia 12/08/2019  . Insomnia   . Joint pain   . Lactose intolerance   . Macromastia 07/2011  . Migraine   . Morbid obesity (HCC) 12/08/2019  . Osteoporosis   . Urticaria   . Vitamin D deficiency     Past Surgical History:  Procedure Laterality Date  . 24 HOUR PH STUDY N/A 01/12/2018   Procedure: 24 HOUR PH STUDY;  Surgeon: Napoleon Form, MD;  Location: [facility];  Service: Endoscopy;  Laterality: N/A;  . ABDOMINAL HYSTERECTOMY  12/12/2003  . ADENOIDECTOMY  05/2011  . ANKLE ARTHROSCOPY  12/29/2007   right; with extensive debridement  . BLADDER NECK RECONSTRUCTION  01/14/2011   Procedure: BLADDER NECK REPAIR;  Surgeon: Reva Bores, MD;  Location: [facility];  Service: Gynecology;  Laterality: N/A;  Laparoscopic Repair of Incidental Cystotomy  . BREAST REDUCTION SURGERY  08/05/2011   Procedure: MAMMARY REDUCTION  (BREAST);  Surgeon: Louisa Second, MD;  Location: [facility];  Service:  Plastics;  Laterality: Bilateral;  bilateral  . BREAST SURGERY  2012   breast reduction  . CESAREAN SECTION  12/29/2000; 1994  . DILATION AND CURETTAGE OF UTERUS  07/08/2003   open laparoscopy  . ESOPHAGEAL MANOMETRY N/A 01/12/2018   Procedure: ESOPHAGEAL MANOMETRY (EM);  Surgeon: Napoleon Form, MD;  Location: [facility];  Service: Endoscopy;  Laterality: N/A;  . GIVENS CAPSULE STUDY N/A 06/23/2017   Procedure: GIVENS CAPSULE STUDY;  Surgeon: Benancio Deeds, MD;  Location: [facility];  Service: Gastroenterology;  Laterality: N/A;  . LAPAROSCOPIC SALPINGOOPHERECTOMY  01/14/2011   left  . PH IMPEDANCE STUDY N/A 01/12/2018   Procedure: PH IMPEDANCE STUDY;  Surgeon: Napoleon Form, MD;  Location: [facility];  Service: Endoscopy;  Laterality: N/A;  . REPAIR PERONEAL TENDONS ANKLE  01/03/2010   repair right subluxing peroneal tendons  . RESECTION DISTAL CLAVICAL Right 09/25/2017   Procedure: RESECTION DISTAL CLAVICAL;  Surgeon: Bjorn Pippin, MD;  Location: [facility];  Service: Orthopedics;  Laterality: Right;  . RIGHT OOPHORECTOMY     with lysis of adhesions  . SHOULDER ACROMIOPLASTY Right 09/25/2017   Procedure: SHOULDER ACROMIOPLASTY;  Surgeon: Bjorn Pippin, MD;  Location: [facility];  Service: Orthopedics;  Laterality: Right;  . SHOULDER  ARTHROSCOPY WITH DEBRIDEMENT AND BICEP TENDON REPAIR Right 09/25/2017   Procedure: SHOULDER ARTHROSCOPY WITH DEBRIDEMENT AND BICEP TENDON REPAIR;  Surgeon: Bjorn Pippin, MD;  Location: [facility];  Service: Orthopedics;  Laterality: Right;  . SHOULDER ARTHROSCOPY WITH ROTATOR CUFF REPAIR Right 09/25/2017   Procedure: SHOULDER ARTHROSCOPY WITH ROTATOR CUFF REPAIR;  Surgeon: Bjorn Pippin, MD;  Location: [facility];  Service: Orthopedics;  Laterality: Right;  . TONSILLECTOMY  as a child  . TUBAL LIGATION  12/29/2000  . UPPER GASTROINTESTINAL ENDOSCOPY      There were no vitals filed for  this visit.   Subjective Assessment - 04/07/20 1607    Subjective Pt. indicated pain seems to be less consistent than before but pain still noted.  Pt. stated doing stuff around house brings on a lot of pain from neck and back of shoulder.    Limitations Sitting;House hold activities   driving bus   Patient Stated Goals Reduce pain    Pain Score 3     Pain Location Shoulder    Pain Orientation Right    Pain Onset More than a month ago    Aggravating Factors  housework                             [facility] Adult PT Treatment/Exercise - 04/07/20 0001      Neck Exercises: Machines for Strengthening   UBE (Upper Arm Bike) lvl 2.5 3 mins fwd/back each way      Manual Therapy   Manual therapy comments compression to Rt upper trap, infraspinatus            Trigger Point Dry Needling - 04/07/20 0001    Consent Given? Yes    Muscles Treated Head and Neck Upper trapezius    Muscles Treated Upper Quadrant Infraspinatus    Upper Trapezius Response Twitch reponse elicited    Infraspinatus Response Twitch response elicited                  PT Short Term Goals - 03/27/20 3016      PT SHORT TERM GOAL #1   Title Patient will demonstrate independent use of home exercise program to maintain progress from in clinic treatments.    Time 3    Period Weeks    Status New    Target Date 04/17/20             PT Long Term Goals - 03/27/20 0923      PT LONG TERM GOAL #1   Title Patient will demonstrate/report pain at worst less than or equal to 2/10 to facilitate minimal limitation in daily activity secondary to pain symptoms.    Time 10    Period Weeks    Status New    Target Date 06/05/20      PT LONG TERM GOAL #2   Title Patient will demonstrate independent use of home exercise program to facilitate ability to maintain/progress functional gains from skilled physical therapy services.    Time 10    Period Weeks    Status New    Target Date 06/05/20      PT  LONG TERM GOAL #3   Title Patient will demonstrate cervical AROM WFL s symptoms to facilitate daily activity including driving, self care at PLOF s limitation due to symptoms.    Time 10    Period Weeks    Status New  Target Date 06/05/20      PT LONG TERM GOAL #4   Title Pt. will demonstrate Rt UE MMT 5/5 throughout to facilitate normalized movement patterns for improved daily use s symptoms.    Time 10    Period Weeks    Status New    Target Date 06/05/20      PT LONG TERM GOAL #5   Title Pt. will demonstrate/report abilty to work, perform housework at [employer] FOTO 68% percent or greater.    Time 10    Period Weeks    Status New    Target Date 06/05/20                 Plan - 04/07/20 1619    Clinical Impression Statement Presentation today of concordant posterior shoulder, anterior shoulder complaints from TrP in infraspinatus as well as Rt cervical pain from upper trap TrP.  Pt. to benefit from continued skilled PT services to improve myofascial mobility as well as improve strength and control for UE use.    Personal Factors and Comorbidities Comorbidity 3+    Comorbidities Rt shoulder scope and RTC repair 2019, anx,obesity, OA    Examination-Activity Limitations Sit;Sleep;Carry;Lift    Examination-Participation Restrictions Yard Work;Community Activity;Cleaning;Occupation;Meal Prep;Laundry;Driving    Stability/Clinical Decision Making Stable/Uncomplicated    Rehab Potential Good    PT Frequency --   1-2x/week   PT Duration --   10 weeks   PT Treatment/Interventions ADLs/Self Care Home Management;Cryotherapy;Electrical Stimulation;Iontophoresis 4mg /ml Dexamethasone;Moist Heat;Traction;Balance training;Therapeutic exercise;Therapeutic activities;Functional mobility training;Stair training;Gait training;Ultrasound;Neuromuscular re-education;Patient/family education;Manual techniques;Taping;Dry needling;Passive range of motion;Spinal Manipulations;Joint Manipulations    PT  Next Visit Plan Reassess HEP, DN prn, cervical mobility gains, postural strengthening    PT Home Exercise Plan [email or web address]    Consulted and Agree with Plan of Care Patient           Patient will benefit from skilled therapeutic intervention in order to improve the following deficits and impairments:  Decreased endurance, Hypomobility, Decreased activity tolerance, Decreased strength, Increased fascial restricitons, Impaired UE functional use, Pain, Increased muscle spasms, Decreased mobility, Decreased range of motion, Impaired perceived functional ability, Improper body mechanics, Postural dysfunction, Impaired flexibility  Visit Diagnosis: Chronic right shoulder pain  Cervicalgia  Muscle weakness (generalized)  Abnormal posture  Pain in thoracic spine     Problem List Patient Active Problem List   Diagnosis Date Noted  . At risk for impaired metabolic function 04/04/2020  . Other fatigue 03/22/2020  . SOBOE (shortness of breath on exertion) 03/22/2020  . Exertional dyspnea 03/14/2020  . Neuropathy, right side of face 03/08/2020  . At risk for depression 03/08/2020  . Primary osteoarthritis of both feet 02/26/2020  . Vitamin D deficiency, unspecified 01/26/2020  . Hyperlipidemia 01/26/2020  . Prediabetes 01/26/2020  . At risk for heart disease 01/26/2020  . Daytime somnolence 01/26/2020  . Vitamin D deficiency 01/04/2020  . Fatty liver 01/04/2020  . B12 deficiency 01/04/2020  . Moderate persistent asthma without complication 12/31/2019  . Inappropriate sinus tachycardia 12/08/2019  . Class 3 severe obesity with serious comorbidity and body mass index (BMI) of 40.0 to 44.9 in adult (HCC) 12/08/2019  . Apnea 12/08/2019  . Anaphylactic reaction due to food, subsequent encounter 11/22/2019  . Allergic rhinoconjunctivitis 11/22/2019  . Idiopathic urticaria 11/22/2019  . Generalized anxiety disorder 04/08/2018  . Atypical chest pain   . Regurgitation of food   .  Laryngopharyngeal reflux (LPR) 07/30/2017  . Throat soreness 07/30/2017  . ANA positive 12/30/2016  .  Gastroesophageal reflux disease 06/27/2016  . Disturbance of skin sensation 04/27/2014  . Dizziness and giddiness 03/14/2014  . Headache, migraine 03/14/2014  . Myalgia and myositis, unspecified 06/07/2013  . Nausea and vomiting in adult 08/22/2012  . Viral syndrome 08/22/2012  . Pelvic pain in female 12/18/2010  . PID (acute pelvic inflammatory disease) 12/18/2010  . ANEMIA-IRON DEFICIENCY 10/15/2006  . HERPES, GENITAL NOS 09/30/2006  . Obesity (BMI 35.0-39.9 without comorbidity) 09/30/2006  . DISORDER, NONORGANIC SLEEP NOS 09/30/2006  . CONSTIPATION NOS 09/30/2006  . NEURITIS, LUMBOSACRAL NOS 09/30/2006  . HX, PERSONAL, MENTAL DISORDER NOS 09/30/2006   Chyrel Masson, PT, DPT, OCS, ATC 04/07/20  4:35 PM    [facility] [address] [place], [place], [address] Phone: [phone]   Fax:  [phone]  Name: [name] MRN: [id number] Date of Birth: [birth year]

## 2020-04-12 ENCOUNTER — Ambulatory Visit (INDEPENDENT_AMBULATORY_CARE_PROVIDER_SITE_OTHER): Payer: 59 | Admitting: Rehabilitative and Restorative Service Providers"

## 2020-04-12 ENCOUNTER — Other Ambulatory Visit: Payer: Self-pay

## 2020-04-12 ENCOUNTER — Encounter: Payer: Self-pay | Admitting: Rehabilitative and Restorative Service Providers"

## 2020-04-12 DIAGNOSIS — M25511 Pain in right shoulder: Secondary | ICD-10-CM | POA: Diagnosis not present

## 2020-04-12 DIAGNOSIS — R293 Abnormal posture: Secondary | ICD-10-CM | POA: Diagnosis not present

## 2020-04-12 DIAGNOSIS — M542 Cervicalgia: Secondary | ICD-10-CM | POA: Diagnosis not present

## 2020-04-12 DIAGNOSIS — G8929 Other chronic pain: Secondary | ICD-10-CM

## 2020-04-12 DIAGNOSIS — M6281 Muscle weakness (generalized): Secondary | ICD-10-CM

## 2020-04-12 DIAGNOSIS — M546 Pain in thoracic spine: Secondary | ICD-10-CM

## 2020-04-12 NOTE — Therapy (Signed)
[facility] [address] [place], [place], [address] Phone: [phone]   Fax:  [phone]  Physical Therapy Treatment  Patient Details  Name: [name] MRN: [id number] Date of Birth: April 14, 1976 Referring Provider (PT): Jari Sportsman PA-C   Encounter Date: 04/12/2020   PT End of Session - 04/12/20 1519    Visit Number 3    Number of Visits 12    Date for PT Re-Evaluation 06/05/20    Progress Note Due on Visit 10    PT Start Time 1517    PT Stop Time 1600    PT Time Calculation (min) 43 min    Activity Tolerance Patient tolerated treatment well    Behavior During Therapy [facility] for tasks assessed/performed           Past Medical History:  Diagnosis Date  . Allergy   . Anemia   . Angio-edema   . Anxiety   . Apnea 12/08/2019  . Arthritis   . Asthma   . Back pain   . Constipation   . Exertional dyspnea 03/14/2020  . Fatty liver   . GERD (gastroesophageal reflux disease)   . Hx of endometriosis   . Inappropriate sinus tachycardia 12/08/2019  . Insomnia   . Joint pain   . Lactose intolerance   . Macromastia 07/2011  . Migraine   . Morbid obesity (HCC) 12/08/2019  . Osteoporosis   . Urticaria   . Vitamin D deficiency     Past Surgical History:  Procedure Laterality Date  . 24 HOUR PH STUDY N/A 01/12/2018   Procedure: 24 HOUR PH STUDY;  Surgeon: Napoleon Form, MD;  Location: [facility];  Service: Endoscopy;  Laterality: N/A;  . ABDOMINAL HYSTERECTOMY  12/12/2003  . ADENOIDECTOMY  05/2011  . ANKLE ARTHROSCOPY  12/29/2007   right; with extensive debridement  . BLADDER NECK RECONSTRUCTION  01/14/2011   Procedure: BLADDER NECK REPAIR;  Surgeon: Reva Bores, MD;  Location: [facility];  Service: Gynecology;  Laterality: N/A;  Laparoscopic Repair of Incidental Cystotomy  . BREAST REDUCTION SURGERY  08/05/2011   Procedure: MAMMARY REDUCTION  (BREAST);  Surgeon: Louisa Second, MD;  Location: [facility];  Service:  Plastics;  Laterality: Bilateral;  bilateral  . BREAST SURGERY  2012   breast reduction  . CESAREAN SECTION  12/29/2000; 1994  . DILATION AND CURETTAGE OF UTERUS  07/08/2003   open laparoscopy  . ESOPHAGEAL MANOMETRY N/A 01/12/2018   Procedure: ESOPHAGEAL MANOMETRY (EM);  Surgeon: Napoleon Form, MD;  [facility]: [facility];  Service: Endoscopy;  Laterality: N/A;  . GIVENS CAPSULE STUDY N/A 06/23/2017   Procedure: GIVENS CAPSULE STUDY;  Surgeon: Benancio Deeds, MD;  Location: [facility];  Service: Gastroenterology;  Laterality: N/A;  . LAPAROSCOPIC SALPINGOOPHERECTOMY  01/14/2011   left  . PH IMPEDANCE STUDY N/A 01/12/2018   Procedure: PH IMPEDANCE STUDY;  Surgeon: Napoleon Form, MD;  [facility]: [facility];  Service: Endoscopy;  Laterality: N/A;  . REPAIR PERONEAL TENDONS ANKLE  01/03/2010   repair right subluxing peroneal tendons  . RESECTION DISTAL CLAVICAL Right 09/25/2017   Procedure: RESECTION DISTAL CLAVICAL;  Surgeon: Bjorn Pippin, MD;  Location: [facility];  Service: Orthopedics;  Laterality: Right;  . RIGHT OOPHORECTOMY     with lysis of adhesions  . SHOULDER ACROMIOPLASTY Right 09/25/2017   Procedure: SHOULDER ACROMIOPLASTY;  Surgeon: Bjorn Pippin, MD;  Location: [facility];  Service: Orthopedics;  Laterality: Right;  . SHOULDER  ARTHROSCOPY WITH DEBRIDEMENT AND BICEP TENDON REPAIR Right 09/25/2017   Procedure: SHOULDER ARTHROSCOPY WITH DEBRIDEMENT AND BICEP TENDON REPAIR;  Surgeon: Bjorn Pippin, MD;  Location: [facility];  Service: Orthopedics;  Laterality: Right;  . SHOULDER ARTHROSCOPY WITH ROTATOR CUFF REPAIR Right 09/25/2017   Procedure: SHOULDER ARTHROSCOPY WITH ROTATOR CUFF REPAIR;  Surgeon: Bjorn Pippin, MD;  Location: [facility];  Service: Orthopedics;  Laterality: Right;  . TONSILLECTOMY  as a child  . TUBAL LIGATION  12/29/2000  . UPPER GASTROINTESTINAL ENDOSCOPY      There were no vitals filed for  this visit.   Subjective Assessment - 04/12/20 1520    Subjective Pt. indicated having some mild improvement in symptoms of Rt shoulder after last visit but still reported feeling tightness and pain after working and using arm.    Limitations Sitting;House hold activities   driving bus   Patient Stated Goals Reduce pain    Currently in Pain? Yes    Pain Location Neck   neck/shoulder   Pain Orientation Right    Pain Descriptors / Indicators Tightness;Aching    Pain Onset More than a month ago    Pain Frequency Intermittent    Aggravating Factors  housework, increased Rt arm use    Pain Relieving Factors short term relief c in clinic care    Effect of Pain on Daily Activities Driving bus pain, household Rt UE use pain                             [facility] Adult PT Treatment/Exercise - 04/12/20 0001      Neck Exercises: Supine   Other Supine Exercise UC flexion hold 5 sec x 10    Other Supine Exercise green band horizontal abduction 3 x 10      Modalities   Modalities Electrical Stimulation      Electrical Stimulation   Electrical Stimulation Location Rt cervical/shoulder    Electrical Stimulation Action IFC    Electrical Stimulation Parameters to tolerance    Electrical Stimulation Goals Pain      Manual Therapy   Manual therapy comments compression to Rt upper trap, infraspinatus            Trigger Point Dry Needling - 04/12/20 0001    Muscles Treated Head and Neck Upper trapezius    Muscles Treated Upper Quadrant Infraspinatus    Upper Trapezius Response Twitch reponse elicited    Infraspinatus Response Twitch response elicited                  PT Short Term Goals - 04/12/20 1539      PT SHORT TERM GOAL #1   Title Patient will demonstrate independent use of home exercise program to maintain progress from in clinic treatments.    Baseline 04/12/2020: cues still required for techniques    Time 3    Period Weeks    Status On-going    Target  Date 04/17/20             PT Long Term Goals - 03/27/20 0923      PT LONG TERM GOAL #1   Title Patient will demonstrate/report pain at worst less than or equal to 2/10 to facilitate minimal limitation in daily activity secondary to pain symptoms.    Time 10    Period Weeks    Status New    Target Date 06/05/20  PT LONG TERM GOAL #2   Title Patient will demonstrate independent use of home exercise program to facilitate ability to maintain/progress functional gains from skilled physical therapy services.    Time 10    Period Weeks    Status New    Target Date 06/05/20      PT LONG TERM GOAL #3   Title Patient will demonstrate cervical AROM WFL s symptoms to facilitate daily activity including driving, self care at PLOF s limitation due to symptoms.    Time 10    Period Weeks    Status New    Target Date 06/05/20      PT LONG TERM GOAL #4   Title Pt. will demonstrate Rt UE MMT 5/5 throughout to facilitate normalized movement patterns for improved daily use s symptoms.    Time 10    Period Weeks    Status New    Target Date 06/05/20      PT LONG TERM GOAL #5   Title Pt. will demonstrate/report abilty to work, perform housework at [employer] FOTO 68% percent or greater.    Time 10    Period Weeks    Status New    Target Date 06/05/20                 Plan - 04/12/20 1552    Clinical Impression Statement Continued complaints of Rt cervical/shoulder tightness, pain worsened c increased activity/use of Rt UE at this time.  Continued manual intervention paired c ther ex to improve mobility and myofascial pain in affected area with mild success at this time.  Continued emphasis c HEP for improve mobility, postural strengthening to reduce load and overworking on Rt side.    Personal Factors and Comorbidities Comorbidity 3+    Comorbidities Rt shoulder scope and RTC repair 2019, anx,obesity, OA    Examination-Activity Limitations Sit;Sleep;Carry;Lift     Examination-Participation Restrictions Yard Work;Community Activity;Cleaning;Occupation;Meal Prep;Laundry;Driving    Stability/Clinical Decision Making Stable/Uncomplicated    Rehab Potential Good    PT Frequency --   1-2x/week   PT Duration --   10 weeks   PT Treatment/Interventions ADLs/Self Care Home Management;Cryotherapy;Electrical Stimulation;Iontophoresis 4mg /ml Dexamethasone;Moist Heat;Traction;Balance training;Therapeutic exercise;Therapeutic activities;Functional mobility training;Stair training;Gait training;Ultrasound;Neuromuscular re-education;Patient/family education;Manual techniques;Taping;Dry needling;Passive range of motion;Spinal Manipulations;Joint Manipulations    PT Next Visit Plan Continue review c HEP, DN prn, cervical mobility gains, postural strengthening    PT Home Exercise Plan [id number]    Consulted and Agree with Plan of Care Patient           Patient will benefit from skilled therapeutic intervention in order to improve the following deficits and impairments:  Decreased endurance, Hypomobility, Decreased activity tolerance, Decreased strength, Increased fascial restricitons, Impaired UE functional use, Pain, Increased muscle spasms, Decreased mobility, Decreased range of motion, Impaired perceived functional ability, Improper body mechanics, Postural dysfunction, Impaired flexibility  Visit Diagnosis: Chronic right shoulder pain  Cervicalgia  Muscle weakness (generalized)  Abnormal posture  Pain in thoracic spine     Problem List Patient Active Problem List   Diagnosis Date Noted  . At risk for impaired metabolic function 04/04/2020  . Other fatigue 03/22/2020  . SOBOE (shortness of breath on exertion) 03/22/2020  . Exertional dyspnea 03/14/2020  . Neuropathy, right side of face 03/08/2020  . At risk for depression 03/08/2020  . Primary osteoarthritis of both feet 02/26/2020  . Vitamin D deficiency, unspecified 01/26/2020  . Hyperlipidemia  01/26/2020  . Prediabetes 01/26/2020  . At risk for heart  disease 01/26/2020  . Daytime somnolence 01/26/2020  . Vitamin D deficiency 01/04/2020  . Fatty liver 01/04/2020  . B12 deficiency 01/04/2020  . Moderate persistent asthma without complication 12/31/2019  . Inappropriate sinus tachycardia 12/08/2019  . Class 3 severe obesity with serious comorbidity and body mass index (BMI) of 40.0 to 44.9 in adult (HCC) 12/08/2019  . Apnea 12/08/2019  . Anaphylactic reaction due to food, subsequent encounter 11/22/2019  . Allergic rhinoconjunctivitis 11/22/2019  . Idiopathic urticaria 11/22/2019  . Generalized anxiety disorder 04/08/2018  . Atypical chest pain   . Regurgitation of food   . Laryngopharyngeal reflux (LPR) 07/30/2017  . Throat soreness 07/30/2017  . ANA positive 12/30/2016  . Gastroesophageal reflux disease 06/27/2016  . Disturbance of skin sensation 04/27/2014  . Dizziness and giddiness 03/14/2014  . Headache, migraine 03/14/2014  . Myalgia and myositis, unspecified 06/07/2013  . Nausea and vomiting in adult 08/22/2012  . Viral syndrome 08/22/2012  . Pelvic pain in female 12/18/2010  . PID (acute pelvic inflammatory disease) 12/18/2010  . ANEMIA-IRON DEFICIENCY 10/15/2006  . HERPES, GENITAL NOS 09/30/2006  . Obesity (BMI 35.0-39.9 without comorbidity) 09/30/2006  . DISORDER, NONORGANIC SLEEP NOS 09/30/2006  . CONSTIPATION NOS 09/30/2006  . NEURITIS, LUMBOSACRAL NOS 09/30/2006  . HX, PERSONAL, MENTAL DISORDER NOS 09/30/2006    Chyrel Masson, PT, DPT, OCS, ATC 04/12/20  3:55 PM    [facility] [address] [place], [place], [address] Phone: [phone]   Fax:  [phone]  Name: [name] MRN: [id number] Date of Birth: 1975-10-07

## 2020-04-14 ENCOUNTER — Ambulatory Visit (INDEPENDENT_AMBULATORY_CARE_PROVIDER_SITE_OTHER): Payer: 59 | Admitting: Rehabilitative and Restorative Service Providers"

## 2020-04-14 ENCOUNTER — Other Ambulatory Visit: Payer: Self-pay

## 2020-04-14 ENCOUNTER — Encounter: Payer: Self-pay | Admitting: Rehabilitative and Restorative Service Providers"

## 2020-04-14 DIAGNOSIS — M542 Cervicalgia: Secondary | ICD-10-CM | POA: Diagnosis not present

## 2020-04-14 DIAGNOSIS — M25511 Pain in right shoulder: Secondary | ICD-10-CM

## 2020-04-14 DIAGNOSIS — M6281 Muscle weakness (generalized): Secondary | ICD-10-CM | POA: Diagnosis not present

## 2020-04-14 DIAGNOSIS — R293 Abnormal posture: Secondary | ICD-10-CM

## 2020-04-14 DIAGNOSIS — M546 Pain in thoracic spine: Secondary | ICD-10-CM

## 2020-04-14 DIAGNOSIS — G8929 Other chronic pain: Secondary | ICD-10-CM

## 2020-04-14 NOTE — Therapy (Signed)
[facility] [address] [place], [place], [address] Phone: [phone]   Fax:  [phone]  Physical Therapy Treatment  Patient Details  Name: [name] MRN: [id number] Date of Birth: 1976/01/13 Referring Provider (PT): Jari Sportsman PA-C   Encounter Date: 04/14/2020   PT End of Session - 04/14/20 0998    Visit Number 4    Number of Visits 12    Date for PT Re-Evaluation 06/05/20    Progress Note Due on Visit 10    PT Start Time 0933    PT Stop Time 1012    PT Time Calculation (min) 39 min    Activity Tolerance Patient tolerated treatment well    Behavior During Therapy [facility] for tasks assessed/performed           Past Medical History:  Diagnosis Date  . Allergy   . Anemia   . Angio-edema   . Anxiety   . Apnea 12/08/2019  . Arthritis   . Asthma   . Back pain   . Constipation   . Exertional dyspnea 03/14/2020  . Fatty liver   . GERD (gastroesophageal reflux disease)   . Hx of endometriosis   . Inappropriate sinus tachycardia 12/08/2019  . Insomnia   . Joint pain   . Lactose intolerance   . Macromastia 07/2011  . Migraine   . Morbid obesity (HCC) 12/08/2019  . Osteoporosis   . Urticaria   . Vitamin D deficiency     Past Surgical History:  Procedure Laterality Date  . 24 HOUR PH STUDY N/A 01/12/2018   Procedure: 24 HOUR PH STUDY;  Surgeon: Napoleon Form, MD;  [facility]: [facility];  Service: Endoscopy;  Laterality: N/A;  . ABDOMINAL HYSTERECTOMY  12/12/2003  . ADENOIDECTOMY  05/2011  . ANKLE ARTHROSCOPY  12/29/2007   right; with extensive debridement  . BLADDER NECK RECONSTRUCTION  01/14/2011   Procedure: BLADDER NECK REPAIR;  Surgeon: Reva Bores, MD;  Location: [facility];  Service: Gynecology;  Laterality: N/A;  Laparoscopic Repair of Incidental Cystotomy  . BREAST REDUCTION SURGERY  08/05/2011   Procedure: MAMMARY REDUCTION  (BREAST);  Surgeon: Louisa Second, MD;  Location: [facility];  Service:  Plastics;  Laterality: Bilateral;  bilateral  . BREAST SURGERY  2012   breast reduction  . CESAREAN SECTION  12/29/2000; 1994  . DILATION AND CURETTAGE OF UTERUS  07/08/2003   open laparoscopy  . ESOPHAGEAL MANOMETRY N/A 01/12/2018   Procedure: ESOPHAGEAL MANOMETRY (EM);  Surgeon: Napoleon Form, MD;  Location: [facility];  Service: Endoscopy;  Laterality: N/A;  . GIVENS CAPSULE STUDY N/A 06/23/2017   Procedure: GIVENS CAPSULE STUDY;  Surgeon: Benancio Deeds, MD;  Location: [facility];  Service: Gastroenterology;  Laterality: N/A;  . LAPAROSCOPIC SALPINGOOPHERECTOMY  01/14/2011   left  . PH IMPEDANCE STUDY N/A 01/12/2018   Procedure: PH IMPEDANCE STUDY;  Surgeon: Napoleon Form, MD;  [facility]: [facility];  Service: Endoscopy;  Laterality: N/A;  . REPAIR PERONEAL TENDONS ANKLE  01/03/2010   repair right subluxing peroneal tendons  . RESECTION DISTAL CLAVICAL Right 09/25/2017   Procedure: RESECTION DISTAL CLAVICAL;  Surgeon: Bjorn Pippin, MD;  Location: [facility];  Service: Orthopedics;  Laterality: Right;  . RIGHT OOPHORECTOMY     with lysis of adhesions  . SHOULDER ACROMIOPLASTY Right 09/25/2017   Procedure: SHOULDER ACROMIOPLASTY;  Surgeon: Bjorn Pippin, MD;  Location: [facility];  Service: Orthopedics;  Laterality: Right;  . SHOULDER  ARTHROSCOPY WITH DEBRIDEMENT AND BICEP TENDON REPAIR Right 09/25/2017   Procedure: SHOULDER ARTHROSCOPY WITH DEBRIDEMENT AND BICEP TENDON REPAIR;  Surgeon: Bjorn Pippin, MD;  Location: [facility];  Service: Orthopedics;  Laterality: Right;  . SHOULDER ARTHROSCOPY WITH ROTATOR CUFF REPAIR Right 09/25/2017   Procedure: SHOULDER ARTHROSCOPY WITH ROTATOR CUFF REPAIR;  Surgeon: Bjorn Pippin, MD;  Location: [facility];  Service: Orthopedics;  Laterality: Right;  . TONSILLECTOMY  as a child  . TUBAL LIGATION  12/29/2000  . UPPER GASTROINTESTINAL ENDOSCOPY      There were no vitals filed for  this visit.   Subjective Assessment - 04/14/20 0937    Subjective Pt. reported feeling some soreness after last visit, reported some pain yesterday overall.  Pt. stated rating pain yesterday at 7/10.  No pain right now.    Limitations Sitting;House hold activities   driving bus   Patient Stated Goals Reduce pain    Currently in Pain? No/denies    Pain Onset More than a month ago              [facility] PT Assessment - 04/14/20 0001      Assessment   Medical Diagnosis Cervical Radic, Rt UE    Referring Provider (PT) Jari Sportsman PA-C    Onset Date/Surgical Date 03/28/19    Hand Dominance Right                         [facility] Adult PT Treatment/Exercise - 04/14/20 0001      Neck Exercises: Machines for Strengthening   UBE (Upper Arm Bike) Lvl 3 3 mins fwd/back each way      Neck Exercises: Theraband   Shoulder External Rotation Blue   3 x 10 bilateral in neutral, standing   Other Theraband Exercises tband rows blue 3 x 10, gh ext 3 x 10      Neck Exercises: Seated   Other Seated Exercise seated thoracic extension in chair over foam roller      Neck Exercises: Supine   Other Supine Exercise UC flexion hold 5 sec x 10, scap retraction 5 sec x 10    Other Supine Exercise blue band horizontal abd 3 x 10                    PT Short Term Goals - 04/12/20 1539      PT SHORT TERM GOAL #1   Title Patient will demonstrate independent use of home exercise program to maintain progress from in clinic treatments.    Baseline 04/12/2020: cues still required for techniques    Time 3    Period Weeks    Status On-going    Target Date 04/17/20             PT Long Term Goals - 03/27/20 0923      PT LONG TERM GOAL #1   Title Patient will demonstrate/report pain at worst less than or equal to 2/10 to facilitate minimal limitation in daily activity secondary to pain symptoms.    Time 10    Period Weeks    Status New    Target Date 06/05/20      PT LONG TERM  GOAL #2   Title Patient will demonstrate independent use of home exercise program to facilitate ability to maintain/progress functional gains from skilled physical therapy services.    Time 10    Period Weeks    Status New  Target Date 06/05/20      PT LONG TERM GOAL #3   Title Patient will demonstrate cervical AROM WFL s symptoms to facilitate daily activity including driving, self care at PLOF s limitation due to symptoms.    Time 10    Period Weeks    Status New    Target Date 06/05/20      PT LONG TERM GOAL #4   Title Pt. will demonstrate Rt UE MMT 5/5 throughout to facilitate normalized movement patterns for improved daily use s symptoms.    Time 10    Period Weeks    Status New    Target Date 06/05/20      PT LONG TERM GOAL #5   Title Pt. will demonstrate/report abilty to work, perform housework at [employer] FOTO 68% percent or greater.    Time 10    Period Weeks    Status New    Target Date 06/05/20                 Plan - 04/14/20 0956    Clinical Impression Statement Focus today primarily on postural strengthening and HEP technique improvements (cues required for techniques).   Pt. may continue to benefit from skilled PT services to improve cervical/thoracic mobility, UE strength for improved activity tolerance.    Personal Factors and Comorbidities Comorbidity 3+    Comorbidities Rt shoulder scope and RTC repair 2019, anx,obesity, OA    Examination-Activity Limitations Sit;Sleep;Carry;Lift    Examination-Participation Restrictions Yard Work;Community Activity;Cleaning;Occupation;Meal Prep;Laundry;Driving    Stability/Clinical Decision Making Stable/Uncomplicated    Rehab Potential Good    PT Frequency --   1-2x/week   PT Duration --   10 weeks   PT Treatment/Interventions ADLs/Self Care Home Management;Cryotherapy;Electrical Stimulation;Iontophoresis 4mg /ml Dexamethasone;Moist Heat;Traction;Balance training;Therapeutic exercise;Therapeutic activities;Functional  mobility training;Stair training;Gait training;Ultrasound;Neuromuscular re-education;Patient/family education;Manual techniques;Taping;Dry needling;Passive range of motion;Spinal Manipulations;Joint Manipulations    PT Next Visit Plan Postural/UE strengthening, cervicothoracic mobility gians    PT Home Exercise Plan [id number]    Consulted and Agree with Plan of Care Patient           Patient will benefit from skilled therapeutic intervention in order to improve the following deficits and impairments:  Decreased endurance,Hypomobility,Decreased activity tolerance,Decreased strength,Increased fascial restricitons,Impaired UE functional use,Pain,Increased muscle spasms,Decreased mobility,Decreased range of motion,Impaired perceived functional ability,Improper body mechanics,Postural dysfunction,Impaired flexibility  Visit Diagnosis: Chronic right shoulder pain  Cervicalgia  Muscle weakness (generalized)  Abnormal posture  Pain in thoracic spine     Problem List Patient Active Problem List   Diagnosis Date Noted  . At risk for impaired metabolic function 04/04/2020  . Other fatigue 03/22/2020  . SOBOE (shortness of breath on exertion) 03/22/2020  . Exertional dyspnea 03/14/2020  . Neuropathy, right side of face 03/08/2020  . At risk for depression 03/08/2020  . Primary osteoarthritis of both feet 02/26/2020  . Vitamin D deficiency, unspecified 01/26/2020  . Hyperlipidemia 01/26/2020  . Prediabetes 01/26/2020  . At risk for heart disease 01/26/2020  . Daytime somnolence 01/26/2020  . Vitamin D deficiency 01/04/2020  . Fatty liver 01/04/2020  . B12 deficiency 01/04/2020  . Moderate persistent asthma without complication 12/31/2019  . Inappropriate sinus tachycardia 12/08/2019  . Class 3 severe obesity with serious comorbidity and body mass index (BMI) of 40.0 to 44.9 in adult (HCC) 12/08/2019  . Apnea 12/08/2019  . Anaphylactic reaction due to food, subsequent encounter  11/22/2019  . Allergic rhinoconjunctivitis 11/22/2019  . Idiopathic urticaria 11/22/2019  . Generalized anxiety disorder 04/08/2018  .  Atypical chest pain   . Regurgitation of food   . Laryngopharyngeal reflux (LPR) 07/30/2017  . Throat soreness 07/30/2017  . ANA positive 12/30/2016  . Gastroesophageal reflux disease 06/27/2016  . Disturbance of skin sensation 04/27/2014  . Dizziness and giddiness 03/14/2014  . Headache, migraine 03/14/2014  . Myalgia and myositis, unspecified 06/07/2013  . Nausea and vomiting in adult 08/22/2012  . Viral syndrome 08/22/2012  . Pelvic pain in female 12/18/2010  . PID (acute pelvic inflammatory disease) 12/18/2010  . ANEMIA-IRON DEFICIENCY 10/15/2006  . HERPES, GENITAL NOS 09/30/2006  . Obesity (BMI 35.0-39.9 without comorbidity) 09/30/2006  . DISORDER, NONORGANIC SLEEP NOS 09/30/2006  . CONSTIPATION NOS 09/30/2006  . NEURITIS, LUMBOSACRAL NOS 09/30/2006  . HX, PERSONAL, MENTAL DISORDER NOS 09/30/2006   Chyrel Masson, PT, DPT, OCS, ATC 04/14/20  10:03 AM    [facility] Physical Therapy [address] [place], [place], [address] Phone: [phone]   Fax:  [phone]  Name: ALISHEA BEAUDIN MRN: [id number] Date of Birth: 01-15-1976

## 2020-04-17 ENCOUNTER — Other Ambulatory Visit: Payer: Self-pay

## 2020-04-17 ENCOUNTER — Ambulatory Visit (HOSPITAL_COMMUNITY): Payer: 59 | Attending: Cardiology

## 2020-04-17 DIAGNOSIS — R0602 Shortness of breath: Secondary | ICD-10-CM | POA: Diagnosis present

## 2020-04-17 LAB — ECHOCARDIOGRAM COMPLETE
Area-P 1/2: 3.65 cm2
S' Lateral: 1.9 cm

## 2020-04-18 ENCOUNTER — Ambulatory Visit (INDEPENDENT_AMBULATORY_CARE_PROVIDER_SITE_OTHER): Payer: 59 | Admitting: Nurse Practitioner

## 2020-04-18 ENCOUNTER — Encounter: Payer: Self-pay | Admitting: Nurse Practitioner

## 2020-04-18 ENCOUNTER — Ambulatory Visit: Payer: 59 | Admitting: Neurology

## 2020-04-18 VITALS — BP 126/80 | HR 60 | Temp 98.0°F | Ht 64.4 in | Wt 221.0 lb

## 2020-04-18 DIAGNOSIS — R7309 Other abnormal glucose: Secondary | ICD-10-CM | POA: Diagnosis not present

## 2020-04-18 DIAGNOSIS — R110 Nausea: Secondary | ICD-10-CM | POA: Diagnosis not present

## 2020-04-18 DIAGNOSIS — E559 Vitamin D deficiency, unspecified: Secondary | ICD-10-CM | POA: Diagnosis not present

## 2020-04-18 DIAGNOSIS — E785 Hyperlipidemia, unspecified: Secondary | ICD-10-CM

## 2020-04-18 DIAGNOSIS — Z0000 Encounter for general adult medical examination without abnormal findings: Secondary | ICD-10-CM

## 2020-04-18 DIAGNOSIS — Z1159 Encounter for screening for other viral diseases: Secondary | ICD-10-CM

## 2020-04-18 DIAGNOSIS — Z23 Encounter for immunization: Secondary | ICD-10-CM

## 2020-04-18 NOTE — Progress Notes (Signed)
I,[name],acting as a Neurosurgeonscribe for SUPERVALU INCJanece Rosemary Pentecost, FNP.,have documented all relevant documentation on the behalf of Sheila FeltsJanece Oluwaseyi Tull, FNP,as directed by  Sheila FeltsJanece Willisha Sligar, FNP while in the presence of Sheila FeltsJanece Anavictoria Wilk, FNP. This visit occurred during the SARS-CoV-2 public health emergency.  Safety protocols were in place, including screening questions prior to the visit, additional usage of staff PPE, and extensive cleaning of exam room while observing appropriate contact time as indicated for disinfecting solutions.  Subjective:     Patient ID: Sheila SangerVickie S [name] , female    DOB: Sep 10, 1975 , 44 y.o.   MRN: [id number]   Chief Complaint  Patient presents with   Annual Exam    HPI  Here for HM.    Wt Readings from Last 3 Encounters: 04/18/20 : 221 lb (100.2 kg) 04/04/20 : 215 lb (97.5 kg) 03/22/20 : 219 lb (99.3 kg) She is going to weight management. She is trying to change the foods she is eating.   She is seeing a Insurance underwriterUrologist for urinary frequency.  She is scheduled to see the Neurologist for her facial discomfort. She is also seeing the Orthopedic for her shoulder pain. She is also     Past Medical History:  Diagnosis Date   Allergy    Anemia    Angio-edema    Anxiety    Apnea 12/08/2019   Arthritis    Asthma    Back pain    Constipation    Exertional dyspnea 03/14/2020   Fatty liver    GERD (gastroesophageal reflux disease)    Hx of endometriosis    Inappropriate sinus tachycardia 12/08/2019   Insomnia    Joint pain    Lactose intolerance    Macromastia 07/2011   Migraine    Morbid obesity (HCC) 12/08/2019   Osteoporosis    Urticaria    Vitamin D deficiency      Family History  Problem Relation Age of Onset   Diabetes Mother    Hypertension Mother    Hyperlipidemia Mother    Sleep apnea Mother    Thyroid disease Sister    Dementia Father    Heart attack Maternal Grandmother    Stroke Maternal Grandfather    Asthma Son    Healthy  Son    Asthma Daughter    Healthy Daughter    Colon cancer Neg Hx    Colon polyps Neg Hx    Esophageal cancer Neg Hx    Rectal cancer Neg Hx    Stomach cancer Neg Hx      Current Outpatient Medications:    albuterol (VENTOLIN HFA) 108 (90 Base) MCG/ACT inhaler, Inhale 2 puffs into the lungs every 6 (six) hours as needed for wheezing or shortness of breath., Disp: 1 each, Rfl: 11   cetirizine (ZYRTEC) 10 MG tablet, Take 1 tablet (10 mg total) by mouth daily as needed for rhinitis., Disp: 30 tablet, Rfl: 5   EPINEPHrine (EPIPEN 2-PAK) 0.3 mg/0.3 mL IJ SOAJ injection, Inject 0.3 mLs (0.3 mg total) into the muscle as needed for anaphylaxis., Disp: 2 each, Rfl: 2   esomeprazole (NEXIUM) 40 MG capsule, TAKE 1 CAPSULE (40 MG TOTAL) BY MOUTH DAILY AT 12 NOON., Disp: 90 capsule, Rfl: 2   famotidine (PEPCID) 20 MG tablet, Take 20 mg by mouth daily., Disp: , Rfl:    fluticasone furoate-vilanterol (BREO ELLIPTA) 200-25 MCG/INH AEPB, Inhale 1 puff into the lungs daily., Disp: 30 each, Rfl: 5   Polysaccharide Iron Complex 125 MG/5ML LIQD, Take 125 mg  by mouth daily., Disp: 180 mL, Rfl: 1   RESTASIS 0.05 % ophthalmic emulsion, 1 drop 2 (two) times daily., Disp: , Rfl:    sucralfate (CARAFATE) 1 g tablet, Take 1 tablet (1 g total) by mouth every 6 (six) hours. Slowly dissolve tablet in 1 Tablespoon of distilled water before ingestion, Disp: 90 tablet, Rfl: 2   Vitamin D, Ergocalciferol, (DRISDOL) 1.25 MG (50000 UNIT) CAPS capsule, 1 tab q wed and 1 po qd sun, Disp: 8 capsule, Rfl: 0   zolpidem (AMBIEN CR) 12.5 MG CR tablet, Take 12.5 mg by mouth at bedtime as needed., Disp: , Rfl:    ondansetron (ZOFRAN) 4 MG tablet, Take 1 tablet (4 mg total) by mouth daily as needed for nausea or vomiting., Disp: 30 tablet, Rfl: 0   valACYclovir (VALTREX) 1000 MG tablet, Take 1 tablet (1,000 mg total) by mouth 2 (two) times daily. (Patient not taking: Reported on 04/18/2020), Disp: 20 tablet, Rfl: 0    Allergies  Allergen Reactions   Aspirin    Ibuprofen    Salmon [Fish Allergy]       The patient states she uses status post hysterectomy for birth control.  Patient's last menstrual period was 12/27/2003.. Negative for: breast discharge, breast lump(s), breast pain and breast self exam. Associated symptoms include abnormal vaginal bleeding. Pertinent negatives include abnormal bleeding (hematology), anxiety, decreased libido, depression, difficulty falling sleep, dyspareunia, history of infertility, nocturia, sexual dysfunction, sleep disturbances, urinary incontinence, urinary urgency, vaginal discharge and vaginal itching. Diet regular; she has been trying to be more mindful of what she is eating.The patient states her exercise level is minimal with a ittle bit of walking.   The patient's tobacco use is:  Social History   Tobacco Use  Smoking Status Never Smoker  Smokeless Tobacco Never Used   She has been exposed to passive smoke. The patient's alcohol use is:  Social History   Substance and Sexual Activity  Alcohol Use Yes   Comment: occasional    Additional information: Last pap 2015 in system however she feels she has had a PAP this year.   Review of Systems  Constitutional: Negative.   HENT: Negative.   Eyes: Negative.   Respiratory: Negative.   Cardiovascular: Negative.  Negative for chest pain, palpitations and leg swelling.  Gastrointestinal: Positive for nausea.  Endocrine: Negative.   Genitourinary: Negative.   Musculoskeletal: Negative.   Skin: Negative.   Allergic/Immunologic: Negative.   Neurological: Negative.   Hematological: Negative.   Psychiatric/Behavioral: Negative.      Today's Vitals   04/18/20 1512  BP: 126/80  Pulse: 60  Temp: 98 F (36.7 C)  TempSrc: Oral  Weight: 221 lb (100.2 kg)  Height: 5' 4.4" (1.636 m)  PainSc: 4   PainLoc: Shoulder   Body mass index is 37.46 kg/m.   Objective:  Physical Exam Constitutional:       General: She is not in acute distress.    Appearance: Normal appearance. She is well-developed. She is obese.  HENT:     Head: Normocephalic and atraumatic.     Right Ear: Hearing, tympanic membrane, ear canal and external ear normal. There is no impacted cerumen.     Left Ear: Hearing, tympanic membrane, ear canal and external ear normal. There is no impacted cerumen.     Nose:     Comments: Deferred - masked    Mouth/Throat:     Comments: Deferred - masked Eyes:     General: Lids are normal.  Extraocular Movements: Extraocular movements intact.     Conjunctiva/sclera: Conjunctivae normal.     Pupils: Pupils are equal, round, and reactive to light.     Funduscopic exam:    Right eye: No papilledema.        Left eye: No papilledema.  Neck:     Thyroid: No thyroid mass.     Vascular: No carotid bruit.  Cardiovascular:     Rate and Rhythm: Normal rate and regular rhythm.     Pulses: Normal pulses.     Heart sounds: Normal heart sounds. No murmur heard.   Pulmonary:     Effort: Pulmonary effort is normal.     Breath sounds: Normal breath sounds.  Chest:     Chest wall: No mass.  Breasts:     Tanner Score is 5.     Right: Normal. No mass, tenderness, axillary adenopathy or supraclavicular adenopathy.     Left: Normal. No mass, tenderness, axillary adenopathy or supraclavicular adenopathy.    Abdominal:     General: Abdomen is flat. Bowel sounds are normal. There is no distension.     Palpations: Abdomen is soft.     Tenderness: There is no abdominal tenderness.  Genitourinary:    Rectum: Guaiac result negative.  Musculoskeletal:        General: No swelling. Normal range of motion.     Cervical back: Full passive range of motion without pain, normal range of motion and neck supple.     Right lower leg: No edema.     Left lower leg: No edema.  Lymphadenopathy:     Upper Body:     Right upper body: No supraclavicular, axillary or pectoral adenopathy.     Left upper  body: No supraclavicular, axillary or pectoral adenopathy.  Skin:    General: Skin is warm and dry.     Capillary Refill: Capillary refill takes less than 2 seconds.  Neurological:     General: No focal deficit present.     Mental Status: She is alert and oriented to person, place, and time.     Cranial Nerves: No cranial nerve deficit.     Sensory: No sensory deficit.  Psychiatric:        Mood and Affect: Mood normal.        Behavior: Behavior normal.        Thought Content: Thought content normal.        Judgment: Judgment normal.         Assessment And Plan:     1. Encounter for general adult medical examination w/o abnormal findings  Behavior modifications discussed and diet history reviewed.    Pt will continue to exercise regularly and modify diet with low GI, plant based foods and decrease intake of processed foods.   Recommend intake of daily multivitamin, Vitamin D, and calcium.   Recommend mammogram and colonoscopy for preventive screenings, as well as recommend immunizations that include influenza, TDAP ( up to date)  2. Hyperlipidemia, unspecified hyperlipidemia type  Chronic, controlled  Continue with current medications  3. Vitamin D deficiency  Will check vitamin D level and supplement as needed.     Also encouraged to spend 15 minutes in the sun daily.   4. Need for influenza vaccination  Influenza vaccine administered  Encouraged to take Tylenol as needed for fever or muscle aches. - Flu Vaccine QUAD 6+ mos PF IM (Fluarix Quad PF)  5. Encounter for hepatitis C screening test for low risk patient  Will check Hepatitis C screening due to recent recommendations to screen all adults 18 years and older - Hepatitis C antibody  6. Nausea Will have intermittent nausea, will treat with limited supply of zofran  If this persist she may need to see GI    Patient was given opportunity to ask questions. Patient verbalized understanding of the plan and  was able to repeat key elements of the plan. All questions were answered to their satisfaction.    Jeanell Sparrow, FNP, have reviewed all documentation for this visit. The documentation on 04/19/20 for the exam, diagnosis, procedures, and orders are all accurate and complete.  THE PATIENT IS ENCOURAGED TO PRACTICE SOCIAL DISTANCING DUE TO THE COVID-19 PANDEMIC.

## 2020-04-18 NOTE — Patient Instructions (Addendum)
Health Maintenance, Female Adopting a healthy lifestyle and getting preventive care are important in promoting health and wellness. Ask your health care provider about:  The right schedule for you to have regular tests and exams.  Things you can do on your own to prevent diseases and keep yourself healthy. What should I know about diet, weight, and exercise? Eat a healthy diet   Eat a diet that includes plenty of vegetables, fruits, low-fat dairy products, and lean protein.  Do not eat a lot of foods that are high in solid fats, added sugars, or sodium. Maintain a healthy weight Body mass index (BMI) is used to identify weight problems. It estimates body fat based on height and weight. Your health care provider can help determine your BMI and help you achieve or maintain a healthy weight. Get regular exercise Get regular exercise. This is one of the most important things you can do for your health. Most adults should:  Exercise for at least 150 minutes each week. The exercise should increase your heart rate and make you sweat (moderate-intensity exercise).  Do strengthening exercises at least twice a week. This is in addition to the moderate-intensity exercise.  Spend less time sitting. Even light physical activity can be beneficial. Watch cholesterol and blood lipids Have your blood tested for lipids and cholesterol at 44 years of age, then have this test every 5 years. Have your cholesterol levels checked more often if:  Your lipid or cholesterol levels are high.  You are older than 44 years of age.  You are at high risk for heart disease. What should I know about cancer screening? Depending on your health history and family history, you may need to have cancer screening at various ages. This may include screening for:  Breast cancer.  Cervical cancer.  Colorectal cancer.  Skin cancer.  Lung cancer. What should I know about heart disease, diabetes, and high blood  pressure? Blood pressure and heart disease  High blood pressure causes heart disease and increases the risk of stroke. This is more likely to develop in people who have high blood pressure readings, are of African descent, or are overweight.  Have your blood pressure checked: ? Every 3-5 years if you are 18-39 years of age. ? Every year if you are 40 years old or older. Diabetes Have regular diabetes screenings. This checks your fasting blood sugar level. Have the screening done:  Once every three years after age 40 if you are at a normal weight and have a low risk for diabetes.  More often and at a younger age if you are overweight or have a high risk for diabetes. What should I know about preventing infection? Hepatitis B If you have a higher risk for hepatitis B, you should be screened for this virus. Talk with your health care provider to find out if you are at risk for hepatitis B infection. Hepatitis C Testing is recommended for:  Everyone born from 1945 through 1965.  Anyone with known risk factors for hepatitis C. Sexually transmitted infections (STIs)  Get screened for STIs, including gonorrhea and chlamydia, if: ? You are sexually active and are younger than 44 years of age. ? You are older than 44 years of age and your health care provider tells you that you are at risk for this type of infection. ? Your sexual activity has changed since you were last screened, and you are at increased risk for chlamydia or gonorrhea. Ask your health care provider if   you are at risk.  Ask your health care provider about whether you are at high risk for HIV. Your health care provider may recommend a prescription medicine to help prevent HIV infection. If you choose to take medicine to prevent HIV, you should first get tested for HIV. You should then be tested every 3 months for as long as you are taking the medicine. Pregnancy  If you are about to stop having your period (premenopausal) and  you may become pregnant, seek counseling before you get pregnant.  Take 400 to 800 micrograms (mcg) of folic acid every day if you become pregnant.  Ask for birth control (contraception) if you want to prevent pregnancy. Osteoporosis and menopause Osteoporosis is a disease in which the bones lose minerals and strength with aging. This can result in bone fractures. If you are 65 years old or older, or if you are at risk for osteoporosis and fractures, ask your health care provider if you should:  Be screened for bone loss.  Take a calcium or vitamin D supplement to lower your risk of fractures.  Be given hormone replacement therapy (HRT) to treat symptoms of menopause. Follow these instructions at home: Lifestyle  Do not use any products that contain nicotine or tobacco, such as cigarettes, e-cigarettes, and chewing tobacco. If you need help quitting, ask your health care provider.  Do not use street drugs.  Do not share needles.  Ask your health care provider for help if you need support or information about quitting drugs. Alcohol use  Do not drink alcohol if: ? Your health care provider tells you not to drink. ? You are pregnant, may be pregnant, or are planning to become pregnant.  If you drink alcohol: ? Limit how much you use to 0-1 drink a day. ? Limit intake if you are breastfeeding.  Be aware of how much alcohol is in your drink. In the [place] one drink equals one 12 oz bottle of beer (355 mL), one 5 oz glass of wine (148 mL), or one 1 oz glass of hard liquor (44 mL). General instructions  Schedule regular health, dental, and eye exams.  Stay current with your vaccines.  Tell your health care provider if: ? You often feel depressed. ? You have ever been abused or do not feel safe at home. Summary  Adopting a healthy lifestyle and getting preventive care are important in promoting health and wellness.  Follow your health care provider's instructions about healthy  diet, exercising, and getting tested or screened for diseases.  Follow your health care provider's instructions on monitoring your cholesterol and blood pressure. This information is not intended to replace advice given to you by your health care provider. Make sure you discuss any questions you have with your health care provider. Document Revised: 04/15/2018 Document Reviewed: 04/15/2018 Elsevier Patient Education  2020 [employer]   COVID-19 Vaccine Information can be found at: [email or web address] For questions related to vaccine distribution or appointments, please email [email or web address] or call [phone].    

## 2020-04-19 ENCOUNTER — Telehealth: Payer: Self-pay

## 2020-04-19 ENCOUNTER — Encounter: Payer: 59 | Admitting: Rehabilitative and Restorative Service Providers"

## 2020-04-19 ENCOUNTER — Other Ambulatory Visit: Payer: Self-pay | Admitting: Nurse Practitioner

## 2020-04-19 ENCOUNTER — Encounter: Payer: Self-pay | Admitting: Nurse Practitioner

## 2020-04-19 DIAGNOSIS — R110 Nausea: Secondary | ICD-10-CM

## 2020-04-19 LAB — LIPID PANEL
Chol/HDL Ratio: 3.2 ratio (ref 0.0–4.4)
Cholesterol, Total: 187 mg/dL (ref 100–199)
HDL: 58 mg/dL (ref >39)
LDL Chol Calc (NIH): 116 mg/dL — ABNORMAL HIGH (ref 0–99)
Triglycerides: 68 mg/dL (ref 0–149)
VLDL Cholesterol Cal: 13 mg/dL (ref 5–40)

## 2020-04-19 LAB — HEMOGLOBIN A1C
Est. average glucose Bld gHb Est-mCnc: 114 mg/dL
Hgb A1c MFr Bld: 5.6 % (ref 4.8–5.6)

## 2020-04-19 LAB — HEPATITIS C ANTIBODY: Hep C Virus Ab: <0.1 s/co ratio (ref 0.0–0.9)

## 2020-04-19 LAB — VITAMIN D 25 HYDROXY (VIT D DEFICIENCY, FRACTURES): Vit D, 25-Hydroxy: 99.1 ng/mL (ref 30.0–100.0)

## 2020-04-19 MED ORDER — ONDANSETRON HCL 4 MG PO TABS: 4.0000 mg | ORAL_TABLET | Freq: Every day | ORAL | 0 refills | Status: DC | PRN

## 2020-04-19 NOTE — Telephone Encounter (Signed)
Patient called stating she has "a couple of questions" regarding her referral.  Patient is requesting a return call.

## 2020-04-20 ENCOUNTER — Encounter: Payer: Self-pay | Admitting: Nurse Practitioner

## 2020-04-20 ENCOUNTER — Other Ambulatory Visit: Payer: Self-pay | Admitting: Gastroenterology

## 2020-04-20 DIAGNOSIS — Z23 Encounter for immunization: Secondary | ICD-10-CM | POA: Diagnosis not present

## 2020-04-20 NOTE — Telephone Encounter (Signed)
I called patient, patient will return call with questions regarding [facility] portal

## 2020-04-20 NOTE — Progress Notes (Signed)
NEUROLOGY FOLLOW UP OFFICE NOTE  [name] [id number]   Subjective:  [name] is a 44 year old right-handed female whom I have seen for multiple symptomatology such as myalgia, weakness, dysphagia, pain, numbness and tingling) with negative workup presents today for right-sided facial pain.    UPDATE: She saw rheumatology for positive ANA.  Given endorsing dry mouth, Sjogren's is considered.  ENA negative.  She started having right sided facial pain a few months ago.  No preceding rash.  She describes an aching pain above her right eye extending to her right maxilla and upper right jaw as well as behind the right ear and including the earlobe.  It is tender to palpation.  It is not a paroxysmal, burning or shooting pain.  She reports some associated right facial numbness that radiates down the right side of her neck as well.  She chronic right sided neck and shoulder pain.  No associated visual disturbance, change in hearing or tinnitus.  She was evaluated by Urgent Care, ENT, ophthalmology, her dentist, and TMJ specialist who ruled out etiologies in their specialty.  However, there assessment was probable trigeminal neuralgia.  She tried gabapentin and amitriptyline but had adverse effects.  She has tried lidocaine cream and naproxen cream, which have been ineffective.    HISTORY: In 2019, she started having trouble swallowing. At first, she had trouble only with solid food but has since included liquids. She denies food getting stuck but it seems to take a while for it to go down. She also reports shortness of breath both exertional or at rest. She also notes associated wheezing and chest discomfort.She was recently evaluated in the ED last month for chest pain. Cardiac and pulmonary workup was negative. She reports that she has been experiencing anxiety. She has been evaluated by gastroenterology for regurgitation. Gastric emptying study from July was normal. At that  time, she reported to GI that she did not have dysphagia. Esophogeal manometryfrom September2019was normal. Myasthenia panel, including Anti-striation antibody, AChR binding antibodies, ACh Blocking antibodies, ACh modulating antibodies. Lupus anticoagulant panel was negative. B12 was normal.Thyroid panel was negative.She also reports intermittent horizontal double vision while driving or looking at something for prolonged period. She saw her ophthalmologist, Dr. Dione Booze, who noted possible right ptosis. She reports neck pain as well. She also reports weakness and heaviness in the legs and reports 2 falls over the past year. She has also reported having foodallergies.She underwent food allergy panel which was normal. She had a change in insurance and couldn't complete workup. However, she reported worsening dysphagia, trouble with swallowing solid foods.Modified barium swallow from 09/02/2019 showed mild intermittent esophageal dysmotility.Also feels generalized weakness and myalgias. No double vision. ANA from June 3 was positive.  She has history of multiple neurologic and somatic symptoms. She has previously endorsed diffuse pain and allodynia with sensations of electric shocks throughout her body. She has history of migraines. Of note, she had an MRI of the head without contrast on 01/16/17 to evaluate right arm numbness, which was personally reviewed and was unremarkable. She was subsequently diagnosed with a complicated migraine. She has history of fatigue. Past sleep study was negative. She has previously been evaluated by rheumatology who has diagnosed her with fibromyalgia. She has history of reflux and regurgitation. She reportedly had a gastric emptying study in 2011. Subsequent gastric imaging study from 2015 was normal. She has been evaluated by multiple ENTs. She has anxiety.  She underwent extensive workup: 10/19/2019 LABS:  Myasthenia gravis panel (AChR binding  abs, striated muscle ab, MuSK abs) negative 11/05/2019 MRI RIGHT SHOULDER WO:  Status post AC joint resection and mild supraspinatus tendinopathy but no acute findings. 12/02/2019 LABS:  B12 1,249 12/21/2019 NCV-EMG:  Normal 12/19/2019 MRI CERVICAL SPINE WO:  Minimal cervical spine degenerative changes.  No significant disc bulge, spinal canal or neural foraminal narrowing. 01/04/2020 MRI BRAIN W WO:  Normal 01/05/2020 UPPER ENDOSCOPY:  3 cm hiatal hernia.  Otherwise, normal.  She denies family history of autoimmune disease on her mother's side (she is not familiar with her father's side of the family).  PAST MEDICAL HISTORY: Past Medical History:  Diagnosis Date  . Allergy   . Anemia   . Angio-edema   . Anxiety   . Apnea 12/08/2019  . Arthritis   . Asthma   . Back pain   . Constipation   . Exertional dyspnea 03/14/2020  . Fatty liver   . GERD (gastroesophageal reflux disease)   . Hx of endometriosis   . Inappropriate sinus tachycardia 12/08/2019  . Insomnia   . Joint pain   . Lactose intolerance   . Macromastia 07/2011  . Migraine   . Morbid obesity (HCC) 12/08/2019  . Osteoporosis   . Urticaria   . Vitamin D deficiency     MEDICATIONS: Current Outpatient Medications on File Prior to Visit  Medication Sig Dispense Refill  . albuterol (VENTOLIN HFA) 108 (90 Base) MCG/ACT inhaler Inhale 2 puffs into the lungs every 6 (six) hours as needed for wheezing or shortness of breath. 1 each 11  . cetirizine (ZYRTEC) 10 MG tablet Take 1 tablet (10 mg total) by mouth daily as needed for rhinitis. 30 tablet 5  . EPINEPHrine (EPIPEN 2-PAK) 0.3 mg/0.3 mL IJ SOAJ injection Inject 0.3 mLs (0.3 mg total) into the muscle as needed for anaphylaxis. 2 each 2  . esomeprazole (NEXIUM) 40 MG capsule TAKE 1 CAPSULE (40 MG TOTAL) BY MOUTH DAILY AT 12 NOON. 90 capsule 2  . famotidine (PEPCID) 20 MG tablet Take 20 mg by mouth daily.    . fluticasone furoate-vilanterol (BREO ELLIPTA) 200-25 MCG/INH AEPB  Inhale 1 puff into the lungs daily. 30 each 5  . ondansetron (ZOFRAN) 4 MG tablet Take 1 tablet (4 mg total) by mouth daily as needed for nausea or vomiting. 30 tablet 0  . Polysaccharide Iron Complex 125 MG/5ML LIQD Take 125 mg by mouth daily. 180 mL 1  . RESTASIS 0.05 % ophthalmic emulsion 1 drop 2 (two) times daily.    . sucralfate (CARAFATE) 1 g tablet Take 1 tablet (1 g total) by mouth every 6 (six) hours. Slowly dissolve tablet in 1 Tablespoon of distilled water before ingestion 90 tablet 2  . valACYclovir (VALTREX) 1000 MG tablet Take 1 tablet (1,000 mg total) by mouth 2 (two) times daily. (Patient not taking: Reported on 04/18/2020) 20 tablet 0  . Vitamin D, Ergocalciferol, (DRISDOL) 1.25 MG (50000 UNIT) CAPS capsule 1 tab q wed and 1 po qd sun 8 capsule 0  . zolpidem (AMBIEN CR) 12.5 MG CR tablet Take 12.5 mg by mouth at bedtime as needed.     No current facility-administered medications on file prior to visit.    ALLERGIES: Allergies  Allergen Reactions  . Aspirin   . Ibuprofen   . Rosanne Ashing Allergy]     FAMILY HISTORY: Family History  Problem Relation Age of Onset  . Diabetes Mother   . Hypertension Mother   . Hyperlipidemia Mother   .  Sleep apnea Mother   . Thyroid disease Sister   . Dementia Father   . Heart attack Maternal Grandmother   . Stroke Maternal Grandfather   . Asthma Son   . Healthy Son   . Asthma Daughter   . Healthy Daughter   . Colon cancer Neg Hx   . Colon polyps Neg Hx   . Esophageal cancer Neg Hx   . Rectal cancer Neg Hx   . Stomach cancer Neg Hx     SOCIAL HISTORY: Social History   Socioeconomic History  . Marital status: Divorced    Spouse name: Not on file  . Number of children: 2  . Years of education: 12th  . Highest education level: Not on file  Occupational History  . Occupation: Facilities manager: OTHER    Comment: [employer]  Tobacco Use  . Smoking status: Never Smoker  . Smokeless tobacco: Never Used  Vaping Use   . Vaping Use: Never used  Substance and Sexual Activity  . Alcohol use: Yes    Comment: occasional   . Drug use: No  . Sexual activity: Not on file  Other Topics Concern  . Not on file  Social History Narrative   Pt lives in 2 story town home with her son   Has 2 children   High school Paramedic for [employer]   Right handed   Social Determinants of Health   Financial Resource Strain: Not on file  Food Insecurity: No Food Insecurity  . Worried About Programme researcher, broadcasting/film/video in the Last Year: Never true  . Ran Out of Food in the Last Year: Never true  Transportation Needs: No Transportation Needs  . Lack of Transportation (Medical): No  . Lack of Transportation (Non-Medical): No  Physical Activity: Not on file  Stress: Not on file  Social Connections: Not on file  Intimate Partner Violence: Not on file     Objective:  Blood pressure 109/74, pulse 77, height 5\' 4"  (1.626 m), weight 222 lb 3.2 oz (100.8 kg), last menstrual period 12/27/2003, SpO2 100 %. General: No acute distress.  Patient appears well-groomed.   Head:  Normocephalic/atraumatic Eyes:  Fundi examined but not visualized Neck: supple, no paraspinal tenderness, full range of motion Heart:  Regular rate and rhythm Lungs:  Clear to auscultation bilaterally Back: No paraspinal tenderness Neurological Exam: alert and oriented to person, place, and time. Attention span and concentration intact, recent and remote memory intact, fund of knowledge intact.  Speech fluent and not dysarthric, language intact.  Endorses reduced vibration sensation with tuning fork over right forehead.  Endorses reduced pinprick sensation in right V1-V3 distribution as well as down the right side of her neck up to at least the upper chest.  Otherwise, CN II-XII intact. Bulk and tone normal, muscle strength 5/5 throughout.  Sensation to light touch, temperature and vibration intact.  Deep tendon reflexes 2+ throughout, toes  downgoing.  Finger to nose and heel to shin testing intact.  Gait normal, Romberg negative.   Assessment/Plan:   1.  Atypical right sided facial pain.  This is not trigeminal neuralgia.  Her pain is not characteristic for trigeminal nerve pain and although there are atypical presentations of trigeminal neuralgia, her pain extends beyond the sensory distribution of the trigeminal nerve.  MRI of brain and cervical spine that I ordered and had performed back in September were normal.  No evidence of any intracranial abnormalities or structural  abnormalities in the cervical spine.  She has some atypical pain syndrome but I have no neurologic explanation for it.  She has already tried and failed gabapentin and amitriptyline, which I would have suggested.  I suspect that it is a functional pain.  She exhibits differential vibration sensation across the forehead which is suggestive of functional symptoms.  She has history of multiple somatic complaints with extensive workup which has been normal.    I recommended that the patient follow up with her PCP.  She may need referral to pain management.                                  Shon MilletAdam Astou Lada, DO  CC:  Arnette FeltsJanece Moore, FNP

## 2020-04-21 ENCOUNTER — Encounter: Payer: Self-pay | Admitting: Nurse Practitioner

## 2020-04-21 ENCOUNTER — Ambulatory Visit (INDEPENDENT_AMBULATORY_CARE_PROVIDER_SITE_OTHER): Payer: 59 | Admitting: Rehabilitative and Restorative Service Providers"

## 2020-04-21 ENCOUNTER — Encounter: Payer: Self-pay | Admitting: Neurology

## 2020-04-21 ENCOUNTER — Ambulatory Visit (INDEPENDENT_AMBULATORY_CARE_PROVIDER_SITE_OTHER): Payer: 59 | Admitting: Neurology

## 2020-04-21 ENCOUNTER — Other Ambulatory Visit: Payer: Self-pay

## 2020-04-21 ENCOUNTER — Encounter: Payer: Self-pay | Admitting: Rehabilitative and Restorative Service Providers"

## 2020-04-21 VITALS — BP 109/74 | HR 77 | Ht 64.0 in | Wt 222.2 lb

## 2020-04-21 DIAGNOSIS — M25511 Pain in right shoulder: Secondary | ICD-10-CM

## 2020-04-21 DIAGNOSIS — G8929 Other chronic pain: Secondary | ICD-10-CM

## 2020-04-21 DIAGNOSIS — G501 Atypical facial pain: Secondary | ICD-10-CM

## 2020-04-21 DIAGNOSIS — M542 Cervicalgia: Secondary | ICD-10-CM

## 2020-04-21 DIAGNOSIS — M6281 Muscle weakness (generalized): Secondary | ICD-10-CM | POA: Diagnosis not present

## 2020-04-21 DIAGNOSIS — R293 Abnormal posture: Secondary | ICD-10-CM | POA: Diagnosis not present

## 2020-04-21 DIAGNOSIS — M546 Pain in thoracic spine: Secondary | ICD-10-CM

## 2020-04-21 NOTE — Patient Instructions (Signed)
You have an atypical facial pain.  This is not trigeminal neuralgia.  I have no explanation for this pain.  MRI of brain and cervical spine were unremarkable.  As you already tried the medications that I would normally use (gabapentin, amitriptyline) I recommend following up with your PCP and request referral to pain management

## 2020-04-21 NOTE — Therapy (Signed)
[facility] [address] [place], [place], [address] Phone: [phone]   Fax:  [phone]  Physical Therapy Treatment  Patient Details  Name: [name] MRN: [id number] Date of Birth: [birth year] Referring Provider (PT): Jari Sportsman PA-C   Encounter Date: 04/21/2020   PT End of Session - 04/21/20 1312    Visit Number 5    Number of Visits 12    Date for PT Re-Evaluation 06/05/20    Progress Note Due on Visit 10    PT Start Time 1302    PT Stop Time 1342    PT Time Calculation (min) 40 min    Activity Tolerance Patient tolerated treatment well    Behavior During Therapy [facility] for tasks assessed/performed           Past Medical History:  Diagnosis Date   Allergy    Anemia    Angio-edema    Anxiety    Apnea 12/08/2019   Arthritis    Asthma    Back pain    Constipation    Exertional dyspnea 03/14/2020   Fatty liver    GERD (gastroesophageal reflux disease)    H/O: hysterectomy    Hx of endometriosis    Inappropriate sinus tachycardia 12/08/2019   Insomnia    Joint pain    Lactose intolerance    Macromastia 07/2011   Migraine    Morbid obesity (HCC) 12/08/2019   Osteoporosis    Urticaria    Vitamin D deficiency     Past Surgical History:  Procedure Laterality Date   68 HOUR PH STUDY N/A 01/12/2018   Procedure: 24 HOUR PH STUDY;  Surgeon: Napoleon Form, MD;  Location: [facility];  Service: Endoscopy;  Laterality: N/A;   ABDOMINAL HYSTERECTOMY  12/12/2003   ADENOIDECTOMY  05/2011   ANKLE ARTHROSCOPY  12/29/2007   right; with extensive debridement   BLADDER NECK RECONSTRUCTION  01/14/2011   Procedure: BLADDER NECK REPAIR;  Surgeon: Reva Bores, MD;  Location: [facility];  Service: Gynecology;  Laterality: N/A;  Laparoscopic Repair of Incidental Cystotomy   BREAST REDUCTION SURGERY  08/05/2011   Procedure: MAMMARY REDUCTION  (BREAST);  Surgeon: Louisa Second, MD;  Location: [facility];  Service: Plastics;  Laterality: Bilateral;  bilateral   BREAST SURGERY  2012   breast reduction   CESAREAN SECTION  12/29/2000; 1994   DILATION AND CURETTAGE OF UTERUS  07/08/2003   open laparoscopy   ESOPHAGEAL MANOMETRY N/A 01/12/2018   Procedure: ESOPHAGEAL MANOMETRY (EM);  Surgeon: Napoleon Form, MD;  Location: [facility];  Service: Endoscopy;  Laterality: N/A;   GIVENS CAPSULE STUDY N/A 06/23/2017   Procedure: GIVENS CAPSULE STUDY;  Surgeon: Benancio Deeds, MD;  Location: [facility];  Service: Gastroenterology;  Laterality: N/A;   LAPAROSCOPIC SALPINGOOPHERECTOMY  01/14/2011   left   PH IMPEDANCE STUDY N/A 01/12/2018   Procedure: PH IMPEDANCE STUDY;  Surgeon: Napoleon Form, MD;  Location: [facility];  Service: Endoscopy;  Laterality: N/A;   REPAIR PERONEAL TENDONS ANKLE  01/03/2010   repair right subluxing peroneal tendons   RESECTION DISTAL CLAVICAL Right 09/25/2017   Procedure: RESECTION DISTAL CLAVICAL;  Surgeon: Bjorn Pippin, MD;  Location: [facility];  Service: Orthopedics;  Laterality: Right;   RIGHT OOPHORECTOMY     with lysis of adhesions   SHOULDER ACROMIOPLASTY Right 09/25/2017   Procedure: SHOULDER ACROMIOPLASTY;  Surgeon: Bjorn Pippin, MD;  Location: [facility];  Service: Orthopedics;  Laterality: Right;   SHOULDER ARTHROSCOPY WITH DEBRIDEMENT AND BICEP TENDON REPAIR Right 09/25/2017   Procedure: SHOULDER ARTHROSCOPY WITH DEBRIDEMENT AND BICEP TENDON REPAIR;  Surgeon: Bjorn Pippin, MD;  Location: [facility];  Service: Orthopedics;  Laterality: Right;   SHOULDER ARTHROSCOPY WITH ROTATOR CUFF REPAIR Right 09/25/2017   Procedure: SHOULDER ARTHROSCOPY WITH ROTATOR CUFF REPAIR;  Surgeon: Bjorn Pippin, MD;  Location: [facility];  Service: Orthopedics;  Laterality: Right;   TONSILLECTOMY  as a child   TUBAL LIGATION  12/29/2000   UPPER GASTROINTESTINAL ENDOSCOPY      There were  no vitals filed for this visit.   Subjective Assessment - 04/21/20 1307    Subjective Pt. stated pain yesterday was more from using arm a lot.  Pt. stated feeling less today, 3/10.    Limitations Sitting;House hold activities   driving bus   Patient Stated Goals Reduce pain    Currently in Pain? Yes    Pain Score 3     Pain Location Neck    Pain Orientation Right    Pain Onset More than a month ago                             [facility] PT Treatment/Exercise - 04/21/20 0001      Neck Exercises: Machines for Strengthening   UBE (Upper Arm Bike) Lvl 2.5 3 mins fwd/back each way      Neck Exercises: Theraband   Shoulder Extension Blue   3 x 15 bilateral   Rows Blue   3 x 15 bilateral   Shoulder External Rotation Blue   3 x 10 bilateral     Neck Exercises: Standing   Other Standing Exercises scaption on wall c lift off x 20 bilateral      Electrical Stimulation   Electrical Stimulation Location Rt cervical/shoulder    Electrical Stimulation Action IFC    Electrical Stimulation Parameters 10 mins to tolerance    Electrical Stimulation Goals Pain                    PT Short Term Goals - 04/12/20 1539      PT SHORT TERM GOAL #1   Title Patient will demonstrate independent use of home exercise program to maintain progress from in clinic treatments.    Baseline 04/12/2020: cues still required for techniques    Time 3    Period Weeks    Status On-going    Target Date 04/17/20             PT Long Term Goals - 03/27/20 0923      PT LONG TERM GOAL #1   Title Patient will demonstrate/report pain at worst less than or equal to 2/10 to facilitate minimal limitation in daily activity secondary to pain symptoms.    Time 10    Period Weeks    Status New    Target Date 06/05/20      PT LONG TERM GOAL #2   Title Patient will demonstrate independent use of home exercise program to facilitate ability to maintain/progress functional gains from skilled  physical therapy services.    Time 10    Period Weeks    Status New    Target Date 06/05/20      PT LONG TERM GOAL #3   Title Patient will demonstrate cervical AROM WFL s symptoms to facilitate daily activity including driving, self care  at PLOF s limitation due to symptoms.    Time 10    Period Weeks    Status New    Target Date 06/05/20      PT LONG TERM GOAL #4   Title Pt. will demonstrate Rt UE MMT 5/5 throughout to facilitate normalized movement patterns for improved daily use s symptoms.    Time 10    Period Weeks    Status New    Target Date 06/05/20      PT LONG TERM GOAL #5   Title Pt. will demonstrate/report abilty to work, perform housework at [employer] c FOTO 68% percent or greater.    Time 10    Period Weeks    Status New    Target Date 06/05/20                 Plan - 04/21/20 1332    Clinical Impression Statement Pt. deferred use of TrP release techniques due to overall reduced symptoms today.  Continued focus on improved strength and control for postural support during daily activity performed today.  Cues still required in repeated exercises today.    Personal Factors and Comorbidities Comorbidity 3+    Comorbidities Rt shoulder scope and RTC repair 2019, anx,obesity, OA    Examination-Activity Limitations Sit;Sleep;Carry;Lift    Examination-Participation Restrictions Yard Work;Community Activity;Cleaning;Occupation;Meal Prep;Laundry;Driving    Stability/Clinical Decision Making Stable/Uncomplicated    Rehab Potential Good    PT Frequency --   1-2x/week   PT Duration --   10 weeks   PT Treatment/Interventions ADLs/Self Care Home Management;Cryotherapy;Electrical Stimulation;Iontophoresis 4mg /ml Dexamethasone;Moist Heat;Traction;Balance training;Therapeutic exercise;Therapeutic activities;Functional mobility training;Stair training;Gait training;Ultrasound;Neuromuscular re-education;Patient/family education;Manual techniques;Taping;Dry needling;Passive range of  motion;Spinal Manipulations;Joint Manipulations    PT Next Visit Plan Postural/UE strengthening, cervicothoracic mobility gians    PT Home Exercise Plan [id number]    Consulted and Agree with Plan of Care Patient           Patient will benefit from skilled therapeutic intervention in order to improve the following deficits and impairments:  Decreased endurance,Hypomobility,Decreased activity tolerance,Decreased strength,Increased fascial restricitons,Impaired UE functional use,Pain,Increased muscle spasms,Decreased mobility,Decreased range of motion,Impaired perceived functional ability,Improper body mechanics,Postural dysfunction,Impaired flexibility  Visit Diagnosis: Chronic right shoulder pain  Cervicalgia  Muscle weakness (generalized)  Abnormal posture  Pain in thoracic spine     Problem List Patient Active Problem List   Diagnosis Date Noted   At risk for impaired metabolic function 04/04/2020   Other fatigue 03/22/2020   SOBOE (shortness of breath on exertion) 03/22/2020   Exertional dyspnea 03/14/2020   Neuropathy, right side of face 03/08/2020   At risk for depression 03/08/2020   Primary osteoarthritis of both feet 02/26/2020   Vitamin D deficiency, unspecified 01/26/2020   Hyperlipidemia 01/26/2020   Prediabetes 01/26/2020   At risk for heart disease 01/26/2020   Daytime somnolence 01/26/2020   Vitamin D deficiency 01/04/2020   Fatty liver 01/04/2020   B12 deficiency 01/04/2020   Moderate persistent asthma without complication 12/31/2019   Inappropriate sinus tachycardia 12/08/2019   Class 3 severe obesity with serious comorbidity and body mass index (BMI) of 40.0 to 44.9 in adult (HCC) 12/08/2019   Apnea 12/08/2019   Anaphylactic reaction due to food, subsequent encounter 11/22/2019   Allergic rhinoconjunctivitis 11/22/2019   Idiopathic urticaria 11/22/2019   Generalized anxiety disorder 04/08/2018   Atypical chest pain     Regurgitation of food    Laryngopharyngeal reflux (LPR) 07/30/2017   Throat soreness 07/30/2017   ANA positive 12/30/2016  Gastroesophageal reflux disease 06/27/2016   Disturbance of skin sensation 04/27/2014   Dizziness and giddiness 03/14/2014   Headache, migraine 03/14/2014   Myalgia and myositis, unspecified 06/07/2013   Nausea and vomiting in adult 08/22/2012   Viral syndrome 08/22/2012   Pelvic pain in female 12/18/2010   PID (acute pelvic inflammatory disease) 12/18/2010   ANEMIA-IRON DEFICIENCY 10/15/2006   HERPES, GENITAL NOS 09/30/2006   Obesity (BMI 35.0-39.9 without comorbidity) 09/30/2006   DISORDER, NONORGANIC SLEEP NOS 09/30/2006   CONSTIPATION NOS 09/30/2006   NEURITIS, LUMBOSACRAL NOS 09/30/2006   HX, PERSONAL, MENTAL DISORDER NOS 09/30/2006   Chyrel MassonMichael Kathrene Sinopoli, PT, DPT, OCS, ATC 04/21/20  1:35 PM    [facility] [address] [place], [place], [id number] Phone: [phone]   Fax:  [phone]  Name: Jonni SangerVickie S Deloatch MRN: [id number] Date of Birth: [birth year]

## 2020-04-24 ENCOUNTER — Ambulatory Visit (INDEPENDENT_AMBULATORY_CARE_PROVIDER_SITE_OTHER): Payer: 59 | Admitting: Family Medicine

## 2020-04-24 ENCOUNTER — Other Ambulatory Visit: Payer: Self-pay | Admitting: Nurse Practitioner

## 2020-04-24 DIAGNOSIS — G501 Atypical facial pain: Secondary | ICD-10-CM

## 2020-04-27 ENCOUNTER — Telehealth: Payer: Self-pay

## 2020-04-27 NOTE — Telephone Encounter (Signed)
Call placed to patient reference PREP class starting 05/09/20 T/TH 1-215pm Agreeable to starting that class Intake scheduled for 05/01/20 at 4pm

## 2020-05-01 NOTE — Progress Notes (Signed)
[facility] PREP Progress Report   Patient Details  Name: [name] MRN: [id number] Date of Birth: 26-Jun-1975 Age: 44 y.o. PCP: [name], FNP  There were no vitals filed for this visit.    [facility] - 05/01/20 1700      Referral    Referring Provider Duke Salvia    Reason for referral Inactivity;Obesitity/Overweight    Program Start Date 05/23/20   T/TH 1p-215pm     Measurement   Waist Circumference 44 inches    Hip Circumference 50.5 inches    Body fat 43.3 percent      Information for Trainer   Goals lose weight goal wt 160, endurance    Current Exercise none    Orthopedic Concerns Right shourlder, calf, ? hip/back    Pertinent Medical History dyspnea with exercise, asthma, tachy, gerd, osteoporosis    Medications that affect exercise Asthma inhaler      Timed Up and Go (TUGS)   Timed Up and Go Low risk <9 seconds      Mobility and Daily Activities   I find it easy to walk up or down two or more flights of stairs. 3    I have no trouble taking out the trash. 4    I do housework such as vacuuming and dusting on my own without difficulty. 1    I can easily lift a gallon of milk (8lbs). 4    I can easily walk a mile. 4    I have no trouble reaching into high cupboards or reaching down to pick up something from the floor. 1    I do not have trouble doing out-door work such as Loss adjuster, chartered, raking leaves, or gardening. 1      Mobility and Daily Activities   I feel younger than my age. 1    I feel independent. 4    I feel energetic. 1    I live an active life.  1    I feel strong. 1    I feel healthy. 1    I feel active as other people my age. 2      How fit and strong are you.   Fit and Strong Total Score 29          Past Medical History:  Diagnosis Date  . Allergy   . Anemia   . Angio-edema   . Anxiety   . Apnea 12/08/2019  . Arthritis   . Asthma   . Back pain   . Constipation   . Exertional dyspnea 03/14/2020  . Fatty liver   . GERD  (gastroesophageal reflux disease)   . H/O: hysterectomy   . Hx of endometriosis   . Inappropriate sinus tachycardia 12/08/2019  . Insomnia   . Joint pain   . Lactose intolerance   . Macromastia 07/2011  . Migraine   . Morbid obesity (HCC) 12/08/2019  . Osteoporosis   . Urticaria   . Vitamin D deficiency    Past Surgical History:  Procedure Laterality Date  . 24 HOUR PH STUDY N/A 01/12/2018   Procedure: 24 HOUR PH STUDY;  Surgeon: Napoleon Form, MD;  Location: [facility];  Service: Endoscopy;  Laterality: N/A;  . ABDOMINAL HYSTERECTOMY  12/12/2003  . ADENOIDECTOMY  05/2011  . ANKLE ARTHROSCOPY  12/29/2007   right; with extensive debridement  . BLADDER NECK RECONSTRUCTION  01/14/2011   Procedure: BLADDER NECK REPAIR;  Surgeon: Reva Bores, MD;  Location: [facility];  Service: Gynecology;  Laterality: N/A;  Laparoscopic Repair of Incidental Cystotomy  . BREAST REDUCTION SURGERY  08/05/2011   Procedure: MAMMARY REDUCTION  (BREAST);  Surgeon: Louisa Second, MD;  Location: [facility];  Service: Plastics;  Laterality: Bilateral;  bilateral  . BREAST SURGERY  2012   breast reduction  . CESAREAN SECTION  12/29/2000; 1994  . DILATION AND CURETTAGE OF UTERUS  07/08/2003   open laparoscopy  . ESOPHAGEAL MANOMETRY N/A 01/12/2018   Procedure: ESOPHAGEAL MANOMETRY (EM);  Surgeon: Napoleon Form, MD;  [facility]: [facility];  Service: Endoscopy;  Laterality: N/A;  . GIVENS CAPSULE STUDY N/A 06/23/2017   Procedure: GIVENS CAPSULE STUDY;  Surgeon: Benancio Deeds, MD;  Location: [facility];  Service: Gastroenterology;  Laterality: N/A;  . LAPAROSCOPIC SALPINGOOPHERECTOMY  01/14/2011   left  . PH IMPEDANCE STUDY N/A 01/12/2018   Procedure: PH IMPEDANCE STUDY;  Surgeon: Napoleon Form, MD;  Location: [facility];  Service: Endoscopy;  Laterality: N/A;  . REPAIR PERONEAL TENDONS ANKLE  01/03/2010   repair right subluxing peroneal tendons  . RESECTION DISTAL CLAVICAL Right  09/25/2017   Procedure: RESECTION DISTAL CLAVICAL;  Surgeon: Bjorn Pippin, MD;  Location: [facility];  Service: Orthopedics;  Laterality: Right;  . RIGHT OOPHORECTOMY     with lysis of adhesions  . SHOULDER ACROMIOPLASTY Right 09/25/2017   Procedure: SHOULDER ACROMIOPLASTY;  Surgeon: Bjorn Pippin, MD;  Location: [facility];  Service: Orthopedics;  Laterality: Right;  . SHOULDER ARTHROSCOPY WITH DEBRIDEMENT AND BICEP TENDON REPAIR Right 09/25/2017   Procedure: SHOULDER ARTHROSCOPY WITH DEBRIDEMENT AND BICEP TENDON REPAIR;  Surgeon: Bjorn Pippin, MD;  Location: [facility];  Service: Orthopedics;  Laterality: Right;  . SHOULDER ARTHROSCOPY WITH ROTATOR CUFF REPAIR Right 09/25/2017   Procedure: SHOULDER ARTHROSCOPY WITH ROTATOR CUFF REPAIR;  Surgeon: Bjorn Pippin, MD;  Location: [facility];  Service: Orthopedics;  Laterality: Right;  . TONSILLECTOMY  as a child  . TUBAL LIGATION  12/29/2000  . UPPER GASTROINTESTINAL ENDOSCOPY     Social History   Tobacco Use  Smoking Status Never Smoker  Smokeless Tobacco Never Used   Okay with starting at later date due to COVID numbers increasing in [employer] with low energy even though she sleeps Vit D level excellent No diabetes  Recommended not sitting for more than 30 min at a time 1/2 body weight in ounces of water every day 1/2 lemon juice in water to start the day  Craves sweets: recommend dark chocolate as well as roasted sweet potatoes Talked about avoiding artificial sugars (uses splenda) which can cause increased appetite-bad for health Recommend stevia instead Talk with gastroenterologist about long term (years)  PPI use ? absorption of vitmains/minerals already has osteoporosis. Commented her heart burn has gotten better with diet changs.        Bonnye Fava 05/01/2020, 5:13 PM

## 2020-05-03 ENCOUNTER — Encounter: Payer: Self-pay | Admitting: Rehabilitative and Restorative Service Providers"

## 2020-05-03 ENCOUNTER — Ambulatory Visit (INDEPENDENT_AMBULATORY_CARE_PROVIDER_SITE_OTHER): Payer: 59 | Admitting: Rehabilitative and Restorative Service Providers"

## 2020-05-03 ENCOUNTER — Other Ambulatory Visit: Payer: Self-pay

## 2020-05-03 DIAGNOSIS — M6281 Muscle weakness (generalized): Secondary | ICD-10-CM

## 2020-05-03 DIAGNOSIS — M25511 Pain in right shoulder: Secondary | ICD-10-CM | POA: Diagnosis not present

## 2020-05-03 DIAGNOSIS — R293 Abnormal posture: Secondary | ICD-10-CM

## 2020-05-03 DIAGNOSIS — G8929 Other chronic pain: Secondary | ICD-10-CM

## 2020-05-03 DIAGNOSIS — M546 Pain in thoracic spine: Secondary | ICD-10-CM

## 2020-05-03 DIAGNOSIS — M542 Cervicalgia: Secondary | ICD-10-CM | POA: Diagnosis not present

## 2020-05-03 NOTE — Therapy (Addendum)
[facility] [address] [place], [place], [address] Phone: [phone]   Fax:  [phone]  Physical Therapy Treatment/Progress note/Discharge Note Patient Details  Name: [name] MRN: [id number] Date of Birth: Dec 18, 1975 Referring Provider (PT): Dwana Melena PA-C   Encounter Date: 05/03/2020   Progress Note Reporting Period 03/27/2020 to 05/03/2020  See note below for Objective Data and Assessment of Progress/Goals.        PT End of Session - 05/03/20 0850    Visit Number 6    Number of Visits 12    Date for PT Re-Evaluation 06/05/20    Progress Note Due on Visit 10    PT Start Time 0849    PT Stop Time 0927    PT Time Calculation (min) 38 min    Activity Tolerance Patient tolerated treatment well    Behavior During Therapy [facility] for tasks assessed/performed           Past Medical History:  Diagnosis Date  . Allergy   . Anemia   . Angio-edema   . Anxiety   . Apnea 12/08/2019  . Arthritis   . Asthma   . Back pain   . Constipation   . Exertional dyspnea 03/14/2020  . Fatty liver   . GERD (gastroesophageal reflux disease)   . H/O: hysterectomy   . Hx of endometriosis   . Inappropriate sinus tachycardia 12/08/2019  . Insomnia   . Joint pain   . Lactose intolerance   . Macromastia 07/2011  . Migraine   . Morbid obesity ([place]) 12/08/2019  . Osteoporosis   . Urticaria   . Vitamin D deficiency     Past Surgical History:  Procedure Laterality Date  . [place] STUDY N/A 01/12/2018   Procedure: [place] STUDY;  Surgeon: Mauri Pole, MD;  Location: [facility];  Service: Endoscopy;  Laterality: N/A;  . ABDOMINAL HYSTERECTOMY  12/12/2003  . ADENOIDECTOMY  05/2011  . ANKLE ARTHROSCOPY  12/29/2007   right; with extensive debridement  . BLADDER NECK RECONSTRUCTION  01/14/2011   Procedure: BLADDER NECK REPAIR;  Surgeon: Donnamae Jude, MD;  Location: [facility];  Service: Gynecology;  Laterality: N/A;  Laparoscopic Repair  of Incidental Cystotomy  . BREAST REDUCTION SURGERY  08/05/2011   Procedure: MAMMARY REDUCTION  (BREAST);  Surgeon: Cristine Polio, MD;  Location: [place];  Service: Plastics;  Laterality: Bilateral;  bilateral  . BREAST SURGERY  2012   breast reduction  . CESAREAN SECTION  12/29/2000; 1994  . DILATION AND CURETTAGE OF UTERUS  07/08/2003   open laparoscopy  . ESOPHAGEAL MANOMETRY N/A 01/12/2018   Procedure: ESOPHAGEAL MANOMETRY (EM);  Surgeon: Mauri Pole, MD;  Location: [facility];  Service: Endoscopy;  Laterality: N/A;  . GIVENS CAPSULE STUDY N/A 06/23/2017   Procedure: GIVENS CAPSULE STUDY;  Surgeon: Yetta Flock, MD;  Location: [place];  Service: Gastroenterology;  Laterality: N/A;  . LAPAROSCOPIC SALPINGOOPHERECTOMY  01/14/2011   left  . [place] IMPEDANCE STUDY N/A 01/12/2018   Procedure: [place] IMPEDANCE STUDY;  Surgeon: Mauri Pole, MD;  [facility];  Service: Endoscopy;  Laterality: N/A;  . REPAIR PERONEAL TENDONS ANKLE  01/03/2010   repair right subluxing peroneal tendons  . RESECTION DISTAL CLAVICAL Right 09/25/2017   Procedure: RESECTION DISTAL CLAVICAL;  Surgeon: Hiram Gash, MD;  Location: [place];  Service: Orthopedics;  Laterality: Right;  . RIGHT OOPHORECTOMY     with lysis of adhesions  .  SHOULDER ACROMIOPLASTY Right 09/25/2017   Procedure: SHOULDER ACROMIOPLASTY;  Surgeon: Varkey, Dax T, MD;  Location: [facility];  Service: Orthopedics;  Laterality: Right;  . SHOULDER ARTHROSCOPY WITH DEBRIDEMENT AND BICEP TENDON REPAIR Right 09/25/2017   Procedure: SHOULDER ARTHROSCOPY WITH DEBRIDEMENT AND BICEP TENDON REPAIR;  Surgeon: Varkey, Dax T, MD;  Location: [facility];  Service: Orthopedics;  Laterality: Right;  . SHOULDER ARTHROSCOPY WITH ROTATOR CUFF REPAIR Right 09/25/2017   Procedure: SHOULDER ARTHROSCOPY WITH ROTATOR CUFF REPAIR;  Surgeon: Varkey, Dax T, MD;  Location: [facility];  Service: Orthopedics;  Laterality: Right;  . TONSILLECTOMY  as a child  . TUBAL LIGATION  12/29/2000  . UPPER GASTROINTESTINAL ENDOSCOPY      There were no vitals filed for this visit.   Subjective Assessment - 05/03/20 0852    Subjective Pt. indicated having mild complaints in superior shoulder today upon arrival.  Pt. indicated feeling unsure of improvement or worsening from last visit to today. Somewhat better +3 reported on GROC today.    Limitations Sitting;House hold activities   driving bus   Patient Stated Goals Reduce pain    Currently in Pain? Yes    Pain Score --   mild   Pain Location Shoulder   neck/shoulder   Pain Orientation Right    Pain Descriptors / Indicators Tightness;Aching    Pain Type Chronic pain    Pain Onset More than a month ago    Pain Frequency Intermittent    Aggravating Factors  housework              [facility] PT Assessment - 05/03/20 0001      Assessment   Medical Diagnosis Cervical Radic, Rt UE    Referring Provider (PT) Mary Stanbery PA-C    Onset Date/Surgical Date 03/28/19    Hand Dominance Right      Observation/Other Assessments   Focus on Therapeutic Outcomes (FOTO)  update :59%   , expected outcome 68% per first visit      AROM   Overall AROM Comments No complaints c cervical AROM communicated today    Cervical Flexion 52    Cervical Extension 58    Cervical - Right Rotation 78    Cervical - Left Rotation 70      Strength   Right Shoulder Flexion 5/5    Right Shoulder ABduction 5/5    Right Shoulder Internal Rotation 5/5    Right Shoulder External Rotation 5/5    Left Shoulder External Rotation 5/5                         [facility] Adult PT Treatment/Exercise - 05/03/20 0001      Neck Exercises: Machines for Strengthening   UBE (Upper Arm Bike) Lvl 2.5 3 mins fwd/back each way      Neck Exercises: Seated   Other Seated Exercise scap retraction blue band ER 2 x 10      Neck Exercises: Supine   Other  Supine Exercise UC flexion hold 5 sec x 10, scap retraction 5 sec hold x 10    Other Supine Exercise blue band horizontal abd, diagonals bilateral 2 x 10 each      Neck Exercises: Stretches   Upper Trapezius Stretch 3 reps   15 sec Rt   Levator Stretch 3 reps   15 sec Rt                   PT Education - 05/03/20 0916    Education Details Home TENS unit product suggested based off Pt. request    Person(s) Educated Patient    Methods Explanation    Comprehension Verbalized understanding            PT Short Term Goals - 04/12/20 1539      PT SHORT TERM GOAL #1   Title Patient will demonstrate independent use of home exercise program to maintain progress from in clinic treatments.    Baseline 04/12/2020: cues still required for techniques    Time 3    Period Weeks    Status On-going    Target Date 04/17/20             PT Long Term Goals - 05/03/20 0906      PT LONG TERM GOAL #1   Title Patient will demonstrate/report pain at worst less than or equal to 2/10 to facilitate minimal limitation in daily activity secondary to pain symptoms.    Time 10    Period Weeks    Status On-going      PT LONG TERM GOAL #2   Title Patient will demonstrate independent use of home exercise program to facilitate ability to maintain/progress functional gains from skilled physical therapy services.    Time 10    Period Weeks    Status On-going      PT LONG TERM GOAL #3   Title Patient will demonstrate cervical AROM WFL s symptoms to facilitate daily activity including driving, self care at PLOF s limitation due to symptoms.    Time 10    Period Weeks    Status Achieved      PT LONG TERM GOAL #4   Title Pt. will demonstrate Rt UE MMT 5/5 throughout to facilitate normalized movement patterns for improved daily use s symptoms.    Time 10    Period Weeks    Status Achieved      PT LONG TERM GOAL #5   Title Pt. will demonstrate/report abilty to work, perform housework at PLOF c FOTO  68% percent or greater.    Time 10    Period Weeks    Status On-going                 Plan - 05/03/20 0856    Clinical Impression Statement Pt. has attended 6 visits overall during course of treatment, reporting GROC +3 somewhat better at this time.  Mild to moderate symptoms still reported at times, self idenfied c increased housework/physical activity c Rt shoulder.  See objective data for updated information regarding current presentation.     Pt. may continue to beneift from skilled PT services to continue gains towards established goals, improved activity tolerance for return to PLOF.    Personal Factors and Comorbidities Comorbidity 3+    Comorbidities Rt shoulder scope and RTC repair 2019, anx,obesity, OA    Examination-Activity Limitations Sit;Sleep;Carry;Lift    Examination-Participation Restrictions Yard Work;Community Activity;Cleaning;Occupation;Meal Prep;Laundry;Driving    Stability/Clinical Decision Making Stable/Uncomplicated    Rehab Potential Good    PT Frequency --   1-2x/week   PT Duration --   10 weeks   PT Treatment/Interventions ADLs/Self Care Home Management;Cryotherapy;Electrical Stimulation;Iontophoresis 4mg/ml Dexamethasone;Moist Heat;Traction;Balance training;Therapeutic exercise;Therapeutic activities;Functional mobility training;Stair training;Gait training;Ultrasound;Neuromuscular re-education;Patient/family education;Manual techniques;Taping;Dry needling;Passive range of motion;Spinal Manipulations;Joint Manipulations    PT Next Visit Plan Postural/UE strengthening, cervicothoracic mobility gains.  Progressive activity tolerance improvement.    PT Home Exercise Plan [id number]    Consulted and   Agree with Plan of Care Patient           Patient will benefit from skilled therapeutic intervention in order to improve the following deficits and impairments:  Decreased endurance,Hypomobility,Decreased activity tolerance,Decreased strength,Increased fascial  restricitons,Impaired UE functional use,Pain,Increased muscle spasms,Decreased mobility,Decreased range of motion,Impaired perceived functional ability,Improper body mechanics,Postural dysfunction,Impaired flexibility  Visit Diagnosis: Chronic right shoulder pain  Cervicalgia  Muscle weakness (generalized)  Abnormal posture  Pain in thoracic spine     Problem List Patient Active Problem List   Diagnosis Date Noted  . At risk for impaired metabolic function 04/04/2020  . Other fatigue 03/22/2020  . SOBOE (shortness of breath on exertion) 03/22/2020  . Exertional dyspnea 03/14/2020  . Neuropathy, right side of face 03/08/2020  . At risk for depression 03/08/2020  . Primary osteoarthritis of both feet 02/26/2020  . Vitamin D deficiency, unspecified 01/26/2020  . Hyperlipidemia 01/26/2020  . Prediabetes 01/26/2020  . At risk for heart disease 01/26/2020  . Daytime somnolence 01/26/2020  . Vitamin D deficiency 01/04/2020  . Fatty liver 01/04/2020  . B12 deficiency 01/04/2020  . Moderate persistent asthma without complication 12/31/2019  . Inappropriate sinus tachycardia 12/08/2019  . Class 3 severe obesity with serious comorbidity and body mass index (BMI) of 40.0 to 44.9 in adult (HCC) 12/08/2019  . Apnea 12/08/2019  . Anaphylactic reaction due to food, subsequent encounter 11/22/2019  . Allergic rhinoconjunctivitis 11/22/2019  . Idiopathic urticaria 11/22/2019  . Generalized anxiety disorder 04/08/2018  . Atypical chest pain   . Regurgitation of food   . Laryngopharyngeal reflux (LPR) 07/30/2017  . Throat soreness 07/30/2017  . ANA positive 12/30/2016  . Gastroesophageal reflux disease 06/27/2016  . Disturbance of skin sensation 04/27/2014  . Dizziness and giddiness 03/14/2014  . Headache, migraine 03/14/2014  . Myalgia and myositis, unspecified 06/07/2013  . Nausea and vomiting in adult 08/22/2012  . Viral syndrome 08/22/2012  . Pelvic pain in female 12/18/2010  .  PID (acute pelvic inflammatory disease) 12/18/2010  . ANEMIA-IRON DEFICIENCY 10/15/2006  . HERPES, GENITAL NOS 09/30/2006  . Obesity (BMI 35.0-39.9 without comorbidity) 09/30/2006  . DISORDER, NONORGANIC SLEEP NOS 09/30/2006  . CONSTIPATION NOS 09/30/2006  . NEURITIS, LUMBOSACRAL NOS 09/30/2006  . HX, PERSONAL, MENTAL DISORDER NOS 09/30/2006    Michael Wright, PT, DPT, OCS, ATC 05/03/20  9:34 AM  PHYSICAL THERAPY DISCHARGE SUMMARY  Visits from Start of Care: 6  Current functional level related to goals / functional outcomes: See note   Remaining deficits: See note   Education / Equipment: HEP Plan: Patient agrees to discharge.  Patient goals were partially met. Patient is being discharged due to not returning since the last visit.  ?????    Michael Wright, PT, DPT, OCS, ATC 06/20/20  10:39 AM     [facility] [address] [place], [place], [address] Phone: [phone]   Fax:  [phone]  Name: Tanna S Auguste MRN: [id number] Date of Birth: 02/01/1976   

## 2020-05-11 ENCOUNTER — Encounter: Payer: 59 | Admitting: Rehabilitative and Restorative Service Providers"

## 2020-05-15 ENCOUNTER — Ambulatory Visit (INDEPENDENT_AMBULATORY_CARE_PROVIDER_SITE_OTHER): Payer: 59 | Admitting: Family Medicine

## 2020-05-15 ENCOUNTER — Telehealth (INDEPENDENT_AMBULATORY_CARE_PROVIDER_SITE_OTHER): Payer: Self-pay

## 2020-05-15 ENCOUNTER — Other Ambulatory Visit: Payer: Self-pay

## 2020-05-15 ENCOUNTER — Other Ambulatory Visit: Payer: Self-pay | Admitting: Family Medicine

## 2020-05-15 ENCOUNTER — Encounter (INDEPENDENT_AMBULATORY_CARE_PROVIDER_SITE_OTHER): Payer: Self-pay | Admitting: Family Medicine

## 2020-05-15 VITALS — BP 100/69 | HR 69 | Temp 98.0°F | Ht 64.0 in | Wt 216.0 lb

## 2020-05-15 DIAGNOSIS — R7303 Prediabetes: Secondary | ICD-10-CM

## 2020-05-15 DIAGNOSIS — E559 Vitamin D deficiency, unspecified: Secondary | ICD-10-CM | POA: Diagnosis not present

## 2020-05-15 DIAGNOSIS — E7849 Other hyperlipidemia: Secondary | ICD-10-CM | POA: Diagnosis not present

## 2020-05-15 DIAGNOSIS — Z9189 Other specified personal risk factors, not elsewhere classified: Secondary | ICD-10-CM | POA: Insufficient documentation

## 2020-05-15 MED ORDER — VITAMIN D (ERGOCALCIFEROL) 1.25 MG (50000 UNIT) PO CAPS
ORAL_CAPSULE | ORAL | 0 refills | Status: DC
Start: 2020-05-15 — End: 2020-05-29

## 2020-05-15 MED ORDER — VITAMIN D (ERGOCALCIFEROL) 1.25 MG (50000 UNIT) PO CAPS: ORAL_CAPSULE | ORAL | 0 refills | Status: DC

## 2020-05-15 NOTE — Telephone Encounter (Signed)
Call to pharmacy, spoke with [facility].  Asked her to d/c the twice weekly vitamin d and to fill the once weekly vitamin d.  No further needs, call ended.

## 2020-05-17 NOTE — Progress Notes (Signed)
Chief Complaint:   OBESITY [name] is here to discuss her progress with her obesity treatment plan along with follow-up of her obesity related diagnoses. [name] is on the Category 3 Plan with breakfast options/has been doing category 2 and states she is following her eating plan approximately 80% of the time. [name] states she is walking 30-40 minutes 1-2 times per week.  Today's visit was #: 8 Starting weight: 239 lbs Starting date: 01/04/2020 Today's weight: 216 lbs Today's date: 05/15/2020 Total lbs lost to date: 23 lbs Total lbs lost since last in-office visit: +1 lb Total weight loss percentage to date: -9.62%  Interim History: [name] thought she would gain more weight than she did because there were several days over the holidays that she ate off the plan. This is her first OV with me since 04/04/2020. Patient was seen by her PCP and had bloodwork done mid-December. [name] states a message was sent but she wanted to go over them with me today, as well.  Assessment/Plan:   Meds ordered this encounter  Medications  . DISCONTD: Vitamin D, Ergocalciferol, (DRISDOL) 1.25 MG (50000 UNIT) CAPS capsule    Sig: 1 tab q wed and 1 po qd sun    Dispense:  8 capsule    Refill:  0  . Vitamin D, Ergocalciferol, (DRISDOL) 1.25 MG (50000 UNIT) CAPS capsule    Sig: 1 po q Sun    Dispense:  4 capsule    Refill:  0    1. Pre-diabetes Discussed labs with patient today. PCP got labs on [name] on 04/18/2020. Her A1c went from 5.7 to 5.6.  [name] has a diagnosis of prediabetes based on her elevated HgA1c and was informed this puts her at greater risk of developing diabetes. She continues to work on diet and exercise to decrease her risk of diabetes. She denies nausea or hypoglycemia.  Lab Results  Component Value Date   HGBA1C 5.6 04/18/2020   Lab Results  Component Value Date   INSULIN 8.0 01/04/2020   Plan: Counseling done with patient. [name] is not on meds to aid with wt loss, but we  can consider in the future. [name] will continue to work on weight loss, exercise, and decreasing simple carbohydrates to help decrease the risk of diabetes.    2. Other hyperlipidemia Discussed labs with patient today. [name]'s LDL went from 147 4 months ago to 116 on 04/18/2020.  The 10-year ASCVD risk score Denman George DC Montez Hageman., et al., 2013) is: 0.2% [name] has hyperlipidemia and has been trying to improve her cholesterol levels with intensive lifestyle modification including a low saturated fat diet, exercise and weight loss. She denies any chest pain, claudication or myalgias.  Lab Results  Component Value Date   ALT 25 03/07/2020   AST 17 03/07/2020   ALKPHOS 45 03/07/2020   BILITOT 0.3 03/07/2020   Lab Results  Component Value Date   CHOL 187 04/18/2020   HDL 58 04/18/2020   LDLCALC 116 (H) 04/18/2020   TRIG 68 04/18/2020   CHOLHDL 3.2 04/18/2020   Plan: Counseling on decreasing saturated and trans fats done. Continue weight loss and increase exercise discussed with [name]. Congratulated on improvements.   3. Vitamin D deficiency Discussed labs with patient today.  [name]'s Vitamin D level was 99.1 on 04/18/2020. She is currently taking prescription vitamin D 50,000 IU twice a week. She denies nausea, vomiting or muscle weakness.  Ref. Range 04/18/2020 15:52  Vitamin D, 25-Hydroxy Latest Ref Range: 30.0 -  100.0 ng/mL 99.1   Plan: Refill Vit for 1 month, as per below. Low Vitamin D level contributes to fatigue and are associated with obesity, breast, and colon cancer. She agrees to continue to take prescription Vitamin D @50 ,000 IU every week and will follow-up for routine testing of Vitamin D, at least 2-3 times per year to avoid over-replacement.  Refill with decreased dose- Vitamin D, Ergocalciferol, (DRISDOL) 1.25 MG (50000 UNIT) CAPS capsule; 1 po q Sun  Dispense: 4 capsule; Refill: 0   4. Class 2 severe obesity with serious comorbidity and body mass index (BMI) of 37.0 to  37.9 in adult, unspecified obesity type (HCC) [name] is currently in the action stage of change. As such, her goal is to continue with weight loss efforts. She has agreed to the Category 3 Plan with breakfast options.   Exercise goals: As is  Behavioral modification strategies: meal planning and cooking strategies, keeping healthy foods in the home and planning for success.  [name] has agreed to follow-up with our clinic in 2 weeks. She was informed of the importance of frequent follow-up visits to maximize her success with intensive lifestyle modifications for her multiple health conditions.     Objective:   Blood pressure 100/69, pulse 69, temperature 98 F (36.7 C), height 5\' 4"  (1.626 m), weight 216 lb (98 kg), last menstrual period 12/27/2003, SpO2 97 %. Body mass index is 37.08 kg/m.  General: Cooperative, alert, well developed, in no acute distress. HEENT: Conjunctivae and lids unremarkable. Cardiovascular: Regular rhythm.  Lungs: Normal work of breathing. Neurologic: No focal deficits.   Lab Results  Component Value Date   CREATININE 0.87 03/07/2020   BUN 11 03/07/2020   NA 139 03/07/2020   K 3.8 03/07/2020   CL 103 03/07/2020   CO2 29 03/07/2020   Lab Results  Component Value Date   ALT 25 03/07/2020   AST 17 03/07/2020   ALKPHOS 45 03/07/2020   BILITOT 0.3 03/07/2020   Lab Results  Component Value Date   HGBA1C 5.6 04/18/2020   HGBA1C 5.7 (H) 01/04/2020   HGBA1C 5.7 (H) 06/17/2019   HGBA1C 5.1 12/24/2017   HGBA1C 5.1 06/16/2017   Lab Results  Component Value Date   INSULIN 8.0 01/04/2020   Lab Results  Component Value Date   TSH 0.872 01/04/2020   Lab Results  Component Value Date   CHOL 187 04/18/2020   HDL 58 04/18/2020   LDLCALC 116 (H) 04/18/2020   TRIG 68 04/18/2020   CHOLHDL 3.2 04/18/2020   Lab Results  Component Value Date   WBC 7.5 03/07/2020   HGB 11.2 (L) 03/07/2020   HCT 36.4 03/07/2020   MCV 79.3 (L) 03/07/2020   PLT 356  03/07/2020   Lab Results  Component Value Date   IRON 32 (L) 03/07/2020   TIBC 343 03/07/2020   FERRITIN 48 03/07/2020    Attestation Statements:   Reviewed by clinician on day of visit: allergies, medications, problem list, medical history, surgical history, family history, social history, and previous encounter notes.  13/06/2019, am acting as 13/06/2019 for Edmund Hilda, DO.  I have reviewed the above documentation for accuracy and completeness, and I agree with the above. Energy manager, D.O.  The 21st Century Cures Act was signed into law in 2016 which includes the topic of electronic health records.  This provides immediate access to information in MyChart.  This includes consultation notes, operative notes, office notes, lab results and pathology reports.  If you have any questions about what you read please let us know at your next visit so we can discuss your concerns and take corrective action if need be.  We are right here with you.

## 2020-05-19 ENCOUNTER — Telehealth: Payer: Self-pay

## 2020-05-19 NOTE — Telephone Encounter (Signed)
VMT to pt Left message reference change of start date of PREP to Feb 8th still Tues/Thur 1-215pm due to Covid numbers rising

## 2020-05-23 NOTE — Progress Notes (Deleted)
Office Visit Note  Patient: [name]             Date of Birth: 1976-01-31           MRN: [id number]             PCP: [name], FNP Referring: [name], FNP Visit Date: 06/06/2020 Occupation: @GUAROCC @  Subjective:  No chief complaint on file.   History of Present Illness: [name] is a 45 y.o. female ***   Activities of Daily Living:  Patient reports morning stiffness for *** {minute/hour:19697}.   Patient {ACTIONS;DENIES/REPORTS:21021675::"Denies"} nocturnal pain.  Difficulty dressing/grooming: {ACTIONS;DENIES/REPORTS:21021675::"Denies"} Difficulty climbing stairs: {ACTIONS;DENIES/REPORTS:21021675::"Denies"} Difficulty getting out of chair: {ACTIONS;DENIES/REPORTS:21021675::"Denies"} Difficulty using hands for taps, buttons, cutlery, and/or writing: {ACTIONS;DENIES/REPORTS:21021675::"Denies"}  No Rheumatology ROS completed.   PMFS History:  Patient Active Problem List   Diagnosis Date Noted  . Other hyperlipidemia 05/15/2020  . At risk for osteoporosis 05/15/2020  . At risk for impaired metabolic function 04/04/2020  . Other fatigue 03/22/2020  . SOBOE (shortness of breath on exertion) 03/22/2020  . Exertional dyspnea 03/14/2020  . Neuropathy, right side of face 03/08/2020  . At risk for depression 03/08/2020  . Primary osteoarthritis of both feet 02/26/2020  . Vitamin D deficiency, unspecified 01/26/2020  . Hyperlipidemia 01/26/2020  . Prediabetes 01/26/2020  . At risk for heart disease 01/26/2020  . Daytime somnolence 01/26/2020  . Vitamin D deficiency 01/04/2020  . Fatty liver 01/04/2020  . B12 deficiency 01/04/2020  . Moderate persistent asthma without complication 12/31/2019  . Inappropriate sinus tachycardia 12/08/2019  . Class 3 severe obesity with serious comorbidity and body mass index (BMI) of 40.0 to 44.9 in adult (HCC) 12/08/2019  . Apnea 12/08/2019  . Anaphylactic reaction due to food, subsequent encounter 11/22/2019  .  Allergic rhinoconjunctivitis 11/22/2019  . Idiopathic urticaria 11/22/2019  . Generalized anxiety disorder 04/08/2018  . Atypical chest pain   . Regurgitation of food   . Laryngopharyngeal reflux (LPR) 07/30/2017  . Throat soreness 07/30/2017  . ANA positive 12/30/2016  . Gastroesophageal reflux disease 06/27/2016  . Disturbance of skin sensation 04/27/2014  . Dizziness and giddiness 03/14/2014  . Headache, migraine 03/14/2014  . Myalgia and myositis, unspecified 06/07/2013  . Nausea and vomiting in adult 08/22/2012  . Viral syndrome 08/22/2012  . Pelvic pain in female 12/18/2010  . PID (acute pelvic inflammatory disease) 12/18/2010  . ANEMIA-IRON DEFICIENCY 10/15/2006  . HERPES, GENITAL NOS 09/30/2006  . Obesity (BMI 35.0-39.9 without comorbidity) 09/30/2006  . DISORDER, NONORGANIC SLEEP NOS 09/30/2006  . CONSTIPATION NOS 09/30/2006  . NEURITIS, LUMBOSACRAL NOS 09/30/2006  . HX, PERSONAL, MENTAL DISORDER NOS 09/30/2006    Past Medical History:  Diagnosis Date  . Allergy   . Anemia   . Angio-edema   . Anxiety   . Apnea 12/08/2019  . Arthritis   . Asthma   . Back pain   . Constipation   . Exertional dyspnea 03/14/2020  . Fatty liver   . GERD (gastroesophageal reflux disease)   . H/O: hysterectomy   . Hx of endometriosis   . Inappropriate sinus tachycardia 12/08/2019  . Insomnia   . Joint pain   . Lactose intolerance   . Macromastia 07/2011  . Migraine   . Morbid obesity (HCC) 12/08/2019  . Osteoporosis   . Urticaria   . Vitamin D deficiency     Family History  Problem Relation Age of Onset  . Diabetes Mother   . Hypertension Mother   . Hyperlipidemia  Mother   . Sleep apnea Mother   . Thyroid disease Sister   . Dementia Father   . Heart attack Maternal Grandmother   . Stroke Maternal Grandfather   . Asthma Son   . Healthy Son   . Asthma Daughter   . Healthy Daughter   . Colon cancer Neg Hx   . Colon polyps Neg Hx   . Esophageal cancer Neg Hx   . Rectal  cancer Neg Hx   . Stomach cancer Neg Hx    Past Surgical History:  Procedure Laterality Date  . 24 HOUR PH STUDY N/A 01/12/2018   Procedure: 24 HOUR PH STUDY;  Surgeon: Napoleon Form, MD;  Location: [facility];  Service: Endoscopy;  Laterality: N/A;  . ABDOMINAL HYSTERECTOMY  12/12/2003  . ADENOIDECTOMY  05/2011  . ANKLE ARTHROSCOPY  12/29/2007   right; with extensive debridement  . BLADDER NECK RECONSTRUCTION  01/14/2011   Procedure: BLADDER NECK REPAIR;  Surgeon: Reva Bores, MD;  Location: [facility];  Service: Gynecology;  Laterality: N/A;  Laparoscopic Repair of Incidental Cystotomy  . BREAST REDUCTION SURGERY  08/05/2011   Procedure: MAMMARY REDUCTION  (BREAST);  Surgeon: Louisa Second, MD;  Location: [facility];  Service: Plastics;  Laterality: Bilateral;  bilateral  . BREAST SURGERY  2012   breast reduction  . CESAREAN SECTION  12/29/2000; 1994  . DILATION AND CURETTAGE OF UTERUS  07/08/2003   open laparoscopy  . ESOPHAGEAL MANOMETRY N/A 01/12/2018   Procedure: ESOPHAGEAL MANOMETRY (EM);  Surgeon: Napoleon Form, MD;  Location: [facility];  Service: Endoscopy;  Laterality: N/A;  . GIVENS CAPSULE STUDY N/A 06/23/2017   Procedure: GIVENS CAPSULE STUDY;  Surgeon: Benancio Deeds, MD;  Location: [facility];  Service: Gastroenterology;  Laterality: N/A;  . LAPAROSCOPIC SALPINGOOPHERECTOMY  01/14/2011   left  . PH IMPEDANCE STUDY N/A 01/12/2018   Procedure: PH IMPEDANCE STUDY;  Surgeon: Napoleon Form, MD;  Location: [facility];  Service: Endoscopy;  Laterality: N/A;  . REPAIR PERONEAL TENDONS ANKLE  01/03/2010   repair right subluxing peroneal tendons  . RESECTION DISTAL CLAVICAL Right 09/25/2017   Procedure: RESECTION DISTAL CLAVICAL;  Surgeon: Bjorn Pippin, MD;  Location: [facility];  Service: Orthopedics;  Laterality: Right;  . RIGHT OOPHORECTOMY     with lysis of adhesions  . SHOULDER ACROMIOPLASTY Right 09/25/2017   Procedure:  SHOULDER ACROMIOPLASTY;  Surgeon: Bjorn Pippin, MD;  Location: [facility];  Service: Orthopedics;  Laterality: Right;  . SHOULDER ARTHROSCOPY WITH DEBRIDEMENT AND BICEP TENDON REPAIR Right 09/25/2017   Procedure: SHOULDER ARTHROSCOPY WITH DEBRIDEMENT AND BICEP TENDON REPAIR;  Surgeon: Bjorn Pippin, MD;  Location: [facility];  Service: Orthopedics;  Laterality: Right;  . SHOULDER ARTHROSCOPY WITH ROTATOR CUFF REPAIR Right 09/25/2017   Procedure: SHOULDER ARTHROSCOPY WITH ROTATOR CUFF REPAIR;  Surgeon: Bjorn Pippin, MD;  Location: [facility];  Service: Orthopedics;  Laterality: Right;  . TONSILLECTOMY  as a child  . TUBAL LIGATION  12/29/2000  . UPPER GASTROINTESTINAL ENDOSCOPY     Social History   Social History Narrative   Pt lives in 2 story town home with her son   Has 2 children   High school graduate   Bus driver for [employer]   Right handed   Immunization History  Administered Date(s) Administered  . Influenza,inj,Quad PF,6+ Mos 02/15/2016, 12/24/2017, 02/24/2019, 04/20/2020  . Td 05/06/1994  . Tdap 12/06/2015  Objective: Vital Signs: LMP 12/27/2003    Physical Exam   Musculoskeletal Exam: ***  CDAI Exam: CDAI Score: -- Patient Global: --; Provider Global: -- Swollen: --; Tender: -- Joint Exam 06/06/2020   No joint exam has been documented for this visit   There is currently no information documented on the homunculus. Go to the Rheumatology activity and complete the homunculus joint exam.  Investigation: No additional findings.  Imaging: No results found.  Recent Labs: Lab Results  Component Value Date   WBC 7.5 03/07/2020   HGB 11.2 (L) 03/07/2020   PLT 356 03/07/2020   NA 139 03/07/2020   K 3.8 03/07/2020   CL 103 03/07/2020   CO2 29 03/07/2020   GLUCOSE 88 03/07/2020   BUN 11 03/07/2020   CREATININE 0.87 03/07/2020   BILITOT 0.3 03/07/2020   ALKPHOS 45 03/07/2020   AST 17 03/07/2020   ALT  25 03/07/2020   PROT 7.8 03/07/2020   ALBUMIN 4.3 03/07/2020   CALCIUM 9.0 03/07/2020   GFRAA 105 01/04/2020    Speciality Comments: No specialty comments available.  Procedures:  No procedures performed Allergies: Aspirin, Ibuprofen, Salmon [fish allergy], and Zoledronic acid   Assessment / Plan:     Visit Diagnoses: No diagnosis found.  Orders: No orders of the defined types were placed in this encounter.  No orders of the defined types were placed in this encounter.   Face-to-face time spent with patient was *** minutes. Greater than 50% of time was spent in counseling and coordination of care.  Follow-Up Instructions: No follow-ups on file.   Ellen Henri, CMA  Note - This record has been created using Animal nutritionist.  Chart creation errors have been sought, but may not always  have been located. Such creation errors do not reflect on  the standard of medical care.

## 2020-05-28 ENCOUNTER — Encounter (INDEPENDENT_AMBULATORY_CARE_PROVIDER_SITE_OTHER): Payer: Self-pay | Admitting: Family Medicine

## 2020-05-29 ENCOUNTER — Encounter (INDEPENDENT_AMBULATORY_CARE_PROVIDER_SITE_OTHER): Payer: Self-pay | Admitting: Family Medicine

## 2020-05-29 ENCOUNTER — Ambulatory Visit (INDEPENDENT_AMBULATORY_CARE_PROVIDER_SITE_OTHER): Payer: 59 | Admitting: Family Medicine

## 2020-05-29 ENCOUNTER — Other Ambulatory Visit: Payer: Self-pay

## 2020-05-29 VITALS — BP 103/67 | HR 73 | Temp 97.9°F | Ht 64.0 in | Wt 213.0 lb

## 2020-05-29 DIAGNOSIS — E559 Vitamin D deficiency, unspecified: Secondary | ICD-10-CM

## 2020-05-29 DIAGNOSIS — Z6836 Body mass index (BMI) 36.0-36.9, adult: Secondary | ICD-10-CM

## 2020-05-29 MED ORDER — VITAMIN D (ERGOCALCIFEROL) 1.25 MG (50000 UNIT) PO CAPS: ORAL_CAPSULE | ORAL | 0 refills | Status: DC

## 2020-05-30 NOTE — Progress Notes (Signed)
Chief Complaint:   OBESITY [name] is here to discuss her progress with her obesity treatment plan along with follow-up of her obesity related diagnoses. [name] is on the Category 3 Plan with breakfast options and states she is following her eating plan approximately 80% of the time. [name] states she is exercising 0 minutes 0 times per week.  Today's visit was #: 9 Starting weight: 239 lbs Starting date: 01/04/2020 Today's weight: 213 lbs Today's date: 05/29/2020 Total lbs lost to date: 26 lbs Total lbs lost since last in-office visit: 3 lbs Total weight loss percentage to date: -10.88%  Interim History: Pt is unable to eat the foods on Category 3. She says she can't get it all in. We changed it on 03/22/2020 from Category 2 to Category 3 but it is not working. She is not exercising at all but plans to start next month, Feb 8th.   Assessment/Plan:   1. Vitamin D deficiency [name]'s Vitamin D level was 99.1 on 04/18/2020. She is currently taking prescription vitamin D 50,000 IU each week. She denies nausea, vomiting or muscle weakness. Reminded pt that her Vit D level was 99 at last check and we decreased her dose. She is tolerating change well.  Ref. Range 04/18/2020 15:52  Vitamin D, 25-Hydroxy Latest Ref Range: 30.0 - 100.0 ng/mL 99.1    Plan: Refill Vit D for 1 month, as per below. Low Vitamin D level contributes to fatigue and are associated with obesity, breast, and colon cancer. She agrees to continue to take prescription Vitamin D @50 ,000 IU every week and will follow-up for routine testing of Vitamin D, at least 2-3 times per year to avoid over-replacement.  Refill- Vitamin D, Ergocalciferol, (DRISDOL) 1.25 MG (50000 UNIT) CAPS capsule; 1 po q Sun  Dispense: 4 capsule; Refill: 0  2. Class 2 severe obesity with serious comorbidity and body mass index (BMI) of 36.0 to 36.9 in adult, unspecified obesity type (HCC) [name] is currently in the action stage of change. As such, her  goal is to continue with weight loss efforts. She has agreed to change to the Category 2 Plan with breakfast options.   Exercise goals: All adults should avoid inactivity. Some physical activity is better than none, and adults who participate in any amount of physical activity gain some health benefits.  Behavioral modification strategies: increasing lean protein intake, decreasing simple carbohydrates, meal planning and cooking strategies, keeping healthy foods in the home and planning for success.  [name] has agreed to follow-up with our clinic in 2-3 weeks. She was informed of the importance of frequent follow-up visits to maximize her success with intensive lifestyle modifications for her multiple health conditions.   Objective:   Blood pressure 103/67, pulse 73, temperature 97.9 F (36.6 C), height 5\' 4"  (1.626 m), weight 213 lb (96.6 kg), last menstrual period 12/27/2003, SpO2 98 %. Body mass index is 36.56 kg/m.  General: Cooperative, alert, well developed, in no acute distress. HEENT: Conjunctivae and lids unremarkable. Cardiovascular: Regular rhythm.  Lungs: Normal work of breathing. Neurologic: No focal deficits.   Lab Results  Component Value Date   CREATININE 0.87 03/07/2020   BUN 11 03/07/2020   NA 139 03/07/2020   K 3.8 03/07/2020   CL 103 03/07/2020   CO2 29 03/07/2020   Lab Results  Component Value Date   ALT 25 03/07/2020   AST 17 03/07/2020   ALKPHOS 45 03/07/2020   BILITOT 0.3 03/07/2020   Lab Results  Component Value  Date   HGBA1C 5.6 04/18/2020   HGBA1C 5.7 (H) 01/04/2020   HGBA1C 5.7 (H) 06/17/2019   HGBA1C 5.1 12/24/2017   HGBA1C 5.1 06/16/2017   Lab Results  Component Value Date   INSULIN 8.0 01/04/2020   Lab Results  Component Value Date   TSH 0.872 01/04/2020   Lab Results  Component Value Date   CHOL 187 04/18/2020   HDL 58 04/18/2020   LDLCALC 116 (H) 04/18/2020   TRIG 68 04/18/2020   CHOLHDL 3.2 04/18/2020   Lab Results   Component Value Date   WBC 7.5 03/07/2020   HGB 11.2 (L) 03/07/2020   HCT 36.4 03/07/2020   MCV 79.3 (L) 03/07/2020   PLT 356 03/07/2020   Lab Results  Component Value Date   IRON 32 (L) 03/07/2020   TIBC 343 03/07/2020   FERRITIN 48 03/07/2020   Attestation Statements:   Reviewed by clinician on day of visit: allergies, medications, problem list, medical history, surgical history, family history, social history, and previous encounter notes.  Edmund Hilda, am acting as Energy manager for [employer], DO.  I have reviewed the above documentation for accuracy and completeness, and I agree with the above. Carlye Grippe, D.O.  The 21st Century Cures Act was signed into law in 2016 which includes the topic of electronic health records.  This provides immediate access to information in MyChart.  This includes consultation notes, operative notes, office notes, lab results and pathology reports.  If you have any questions about what you read please let us know at your next visit so we can discuss your concerns and take corrective action if need be.  We are right here with you.

## 2020-06-06 ENCOUNTER — Ambulatory Visit: Payer: 59 | Admitting: Rheumatology

## 2020-06-06 ENCOUNTER — Other Ambulatory Visit: Payer: Self-pay

## 2020-06-06 DIAGNOSIS — K760 Fatty (change of) liver, not elsewhere classified: Secondary | ICD-10-CM

## 2020-06-06 DIAGNOSIS — R768 Other specified abnormal immunological findings in serum: Secondary | ICD-10-CM

## 2020-06-06 DIAGNOSIS — M791 Myalgia, unspecified site: Secondary | ICD-10-CM

## 2020-06-06 DIAGNOSIS — K219 Gastro-esophageal reflux disease without esophagitis: Secondary | ICD-10-CM

## 2020-06-06 DIAGNOSIS — R29810 Facial weakness: Secondary | ICD-10-CM

## 2020-06-06 DIAGNOSIS — J4540 Moderate persistent asthma, uncomplicated: Secondary | ICD-10-CM

## 2020-06-06 DIAGNOSIS — H1010 Acute atopic conjunctivitis, unspecified eye: Secondary | ICD-10-CM

## 2020-06-06 DIAGNOSIS — E538 Deficiency of other specified B group vitamins: Secondary | ICD-10-CM

## 2020-06-06 DIAGNOSIS — E559 Vitamin D deficiency, unspecified: Secondary | ICD-10-CM

## 2020-06-06 DIAGNOSIS — R7303 Prediabetes: Secondary | ICD-10-CM

## 2020-06-06 DIAGNOSIS — D508 Other iron deficiency anemias: Secondary | ICD-10-CM

## 2020-06-06 DIAGNOSIS — D509 Iron deficiency anemia, unspecified: Secondary | ICD-10-CM

## 2020-06-06 DIAGNOSIS — M19071 Primary osteoarthritis, right ankle and foot: Secondary | ICD-10-CM

## 2020-06-06 DIAGNOSIS — L501 Idiopathic urticaria: Secondary | ICD-10-CM

## 2020-06-06 DIAGNOSIS — F411 Generalized anxiety disorder: Secondary | ICD-10-CM

## 2020-06-06 DIAGNOSIS — Z8639 Personal history of other endocrine, nutritional and metabolic disease: Secondary | ICD-10-CM

## 2020-06-06 NOTE — Progress Notes (Signed)
HEMATOLOGY/ONCOLOGY CONSULTATION NOTE  Date of Service: 06/07/2020  Patient Care Team: [name], FNP as PCP - General (General Practice) Chilton Si, MD as PCP - Cardiology (Cardiology) Clarene Duke, Karma Lew, RN as [facility] Care Management Drema Dallas, DO as Consulting Physician (Neurology)  CHIEF COMPLAINTS/PURPOSE OF CONSULTATION:  IDA  HISTORY OF PRESENTING ILLNESS:   [name] is a wonderful 45 y.o. female who has been referred to [place] by Dr. Adela Lank for evaluation and management of iron deficiency anemia. The pt reports that she is doing well overall.   The pt reports that she has been anemic for as long as she can remember. Dr. Adela Lank completed an Upper Endoscopy earlier this year to work up the cause for her anemia. No cause has been found yet. Pt also had a Colonoscopy in 2018 and a Capsule Endoscopy in 2019. She denies significant hemorrhoidal bleeding. Pt stopped having menstrual cycles in 2005 after having a partial hysterectomy for Endometriosis. She does not take much OTC pain medication as it upsets her stomach. Pt is currently taking high-dose Vitamin D twice per week and Calcium daily. She does not have thorough knowledge of her paternal family history, but is not aware of any family history of blood disorders or cancers.   Pt is having pain around her ear, right-sided jaw pain, dry mouth, dry eyes & burning sensation mainly in her right eye. She has tried eye lubricants and steroidal eye drops to no avail. Pt has had these symptoms evaluated by ENT & Optometrist, who found no cause. These symptoms have been intermittent for the last month. Pt also reports recent increase in urinary urgency, SOB, and new heart palpitations. She has had difficulty swallowing since 2018, which has not improved since dilation was performed during the latest Endoscopy. Pt was recently on Reclast but has been off for a few months. Pt has been using OTC Ferrous  Sulfate and has experienced constipation.    Most recent lab results (02/16/2020) of CBC is as follows: all values are WNL except for Hgb at 10.7, HCT at 33.5, MCV at 75.6, RDW at 15.7. 02/16/2020 IBC + Ferritin is as follows: Iron at 58, TIBC at 257.0, Sat Ratios at 16.1, Ferritin at 53.5.  On review of systems, pt reports tingling/numbness in hands/feet, discomfort in mastoid area, jaw pain, dry mouth, dry eyes, ankle pain, urinary urgency, SOB, palpitations, constipation and denies joint swelling, rash, mouth sores, abdominal pain, abnormal/excessive bleeding and any other symptoms.   On PMHx the pt reports Endometriosis, Vitamin D deficiency, Osteoporosis, Inappropriate sinus tachycardia, Back pain, Anemia, Partial hysterectomy.  Interval History  [name] is a wonderful 45 y.o. female who is here today for evaluation and management of iron deficiency anemia. The patient's last visit with [place] was on 03/06/21. The pt reports that she is constantly in pain all day.  The pt reports that she has been feeling the same as prior visit. She notes the Iron Polysaccharide caused constipation and cramping. She is taking a OTC Ferrous Sulfate that is 65mg  once daily for last three months with no problems tolerating.   The pt notes she has seen a Rheumatologist and Neurologist that told her there is some issue going on due to the pain in back of her neck and head. She was told this was not Trigeminal Neuralgia. She has been referred to another Rheumatologist for a second opinion. This pain is constant every single day and has spread to her  right leg and back. The cramps cause her to have to stop walking and rest. The pt notes she got an X-ray of her hip due to extreme pain and the results showed no acute abnormality about the right hip.   The pt notes she had a nerve conduction study done in August 2021 and nothing was found in the right extremities. There is no observed pinched nerve in back. The  MRI of brain showed no obvious abnormalities. The pt was referred to a pain clinic.  Lab results today 06/07/2020 of CBC w/diff and CMP is as follows: all values are WNL except for Hgb of 10.9, HCT of 34.9, MCH of 25.0. 06/07/2020 Ferritin is 59 up from 48 06/07/2020 Iron sat up from 9 to 14%  On review of systems, pt reports fatigue, right extremity pain, hip pain, back pain and denies black/bloody stools, vaginal bleeding, leg swelling, abdominal pain and any other symptoms.  MEDICAL HISTORY: Past Medical History:  Diagnosis Date  . Allergy   . Anemia   . Angio-edema   . Anxiety   . Apnea 12/08/2019  . Arthritis   . Asthma   . Back pain   . Constipation   . Exertional dyspnea 03/14/2020  . Fatty liver   . GERD (gastroesophageal reflux disease)   . H/O: hysterectomy   . Hx of endometriosis   . Inappropriate sinus tachycardia 12/08/2019  . Insomnia   . Joint pain   . Lactose intolerance   . Macromastia 07/2011  . Migraine   . Morbid obesity (HCC) 12/08/2019  . Osteoporosis   . Urticaria   . Vitamin D deficiency     SURGICAL HISTORY: Past Surgical History:  Procedure Laterality Date  . 24 HOUR PH STUDY N/A 01/12/2018   Procedure: 24 HOUR PH STUDY;  Surgeon: Napoleon Form, MD;  Location: [facility];  Service: Endoscopy;  Laterality: N/A;  . ABDOMINAL HYSTERECTOMY  12/12/2003  . ADENOIDECTOMY  05/2011  . ANKLE ARTHROSCOPY  12/29/2007   right; with extensive debridement  . BLADDER NECK RECONSTRUCTION  01/14/2011   Procedure: BLADDER NECK REPAIR;  Surgeon: Reva Bores, MD;  Location: [facility];  Service: Gynecology;  Laterality: N/A;  Laparoscopic Repair of Incidental Cystotomy  . BREAST REDUCTION SURGERY  08/05/2011   Procedure: MAMMARY REDUCTION  (BREAST);  Surgeon: Louisa Second, MD;  Location: [facility];  Service: Plastics;  Laterality: Bilateral;  bilateral  . BREAST SURGERY  2012   breast reduction  . CESAREAN SECTION  12/29/2000; 1994  . DILATION AND  CURETTAGE OF UTERUS  07/08/2003   open laparoscopy  . ESOPHAGEAL MANOMETRY N/A 01/12/2018   Procedure: ESOPHAGEAL MANOMETRY (EM);  Surgeon: Napoleon Form, MD;  Location: [facility];  Service: Endoscopy;  Laterality: N/A;  . GIVENS CAPSULE STUDY N/A 06/23/2017   Procedure: GIVENS CAPSULE STUDY;  Surgeon: Benancio Deeds, MD;  Location: [facility];  Service: Gastroenterology;  Laterality: N/A;  . LAPAROSCOPIC SALPINGOOPHERECTOMY  01/14/2011   left  . PH IMPEDANCE STUDY N/A 01/12/2018   Procedure: PH IMPEDANCE STUDY;  Surgeon: Napoleon Form, MD;  Location: [facility];  Service: Endoscopy;  Laterality: N/A;  . REPAIR PERONEAL TENDONS ANKLE  01/03/2010   repair right subluxing peroneal tendons  . RESECTION DISTAL CLAVICAL Right 09/25/2017   Procedure: RESECTION DISTAL CLAVICAL;  Surgeon: Bjorn Pippin, MD;  Location: [facility];  Service: Orthopedics;  Laterality: Right;  . RIGHT OOPHORECTOMY     with lysis of adhesions  .  SHOULDER ACROMIOPLASTY Right 09/25/2017   Procedure: SHOULDER ACROMIOPLASTY;  Surgeon: Bjorn Pippin, MD;  Location: [facility];  Service: Orthopedics;  Laterality: Right;  . SHOULDER ARTHROSCOPY WITH DEBRIDEMENT AND BICEP TENDON REPAIR Right 09/25/2017   Procedure: SHOULDER ARTHROSCOPY WITH DEBRIDEMENT AND BICEP TENDON REPAIR;  Surgeon: Bjorn Pippin, MD;  Location: [facility];  Service: Orthopedics;  Laterality: Right;  . SHOULDER ARTHROSCOPY WITH ROTATOR CUFF REPAIR Right 09/25/2017   Procedure: SHOULDER ARTHROSCOPY WITH ROTATOR CUFF REPAIR;  Surgeon: Bjorn Pippin, MD;  Location: [facility];  Service: Orthopedics;  Laterality: Right;  . TONSILLECTOMY  as a child  . TUBAL LIGATION  12/29/2000  . UPPER GASTROINTESTINAL ENDOSCOPY      SOCIAL HISTORY: Social History   Socioeconomic History  . Marital status: Divorced    Spouse name: Not on file  . Number of children: 2  . Years of education: 12th  .  Highest education level: Not on file  Occupational History  . Occupation: Facilities manager: OTHER    Comment: [employer]  Tobacco Use  . Smoking status: Never Smoker  . Smokeless tobacco: Never Used  Vaping Use  . Vaping Use: Never used  Substance and Sexual Activity  . Alcohol use: Yes    Comment: occasional   . Drug use: No  . Sexual activity: Not on file  Other Topics Concern  . Not on file  Social History Narrative   Pt lives in 2 story town home with her son   Has 2 children   High school Paramedic for [employer]   Right handed   Social Determinants of Health   Financial Resource Strain: Not on file  Food Insecurity: No Food Insecurity  . Worried About Programme researcher, broadcasting/film/video in the Last Year: Never true  . Ran Out of Food in the Last Year: Never true  Transportation Needs: No Transportation Needs  . Lack of Transportation (Medical): No  . Lack of Transportation (Non-Medical): No  Physical Activity: Not on file  Stress: Not on file  Social Connections: Not on file  Intimate Partner Violence: Not on file    FAMILY HISTORY: Family History  Problem Relation Age of Onset  . Diabetes Mother   . Hypertension Mother   . Hyperlipidemia Mother   . Sleep apnea Mother   . Thyroid disease Sister   . Dementia Father   . Heart attack Maternal Grandmother   . Stroke Maternal Grandfather   . Asthma Son   . Healthy Son   . Asthma Daughter   . Healthy Daughter   . Colon cancer Neg Hx   . Colon polyps Neg Hx   . Esophageal cancer Neg Hx   . Rectal cancer Neg Hx   . Stomach cancer Neg Hx     ALLERGIES:  is allergic to aspirin, ibuprofen, salmon [fish allergy], and zoledronic acid.  MEDICATIONS:  Current Outpatient Medications  Medication Sig Dispense Refill  . albuterol (VENTOLIN HFA) 108 (90 Base) MCG/ACT inhaler Inhale 2 puffs into the lungs every 6 (six) hours as needed for wheezing or shortness of breath. 1 each 11  . cetirizine (ZYRTEC) 10  MG tablet TAKE 1 TABLET (10 MG TOTAL) BY MOUTH DAILY AS NEEDED FOR RHINITIS. 90 tablet 1  . EPINEPHrine (EPIPEN 2-PAK) 0.3 mg/0.3 mL IJ SOAJ injection Inject 0.3 mLs (0.3 mg total) into the muscle as needed for anaphylaxis. 2 each 2  .  esomeprazole (NEXIUM) 40 MG capsule TAKE 1 CAPSULE (40 MG TOTAL) BY MOUTH DAILY AT 12 NOON. 90 capsule 2  . famotidine (PEPCID) 20 MG tablet Take 20 mg by mouth daily.    . fluticasone furoate-vilanterol (BREO ELLIPTA) 200-25 MCG/INH AEPB Inhale 1 puff into the lungs daily. 30 each 5  . ondansetron (ZOFRAN) 4 MG tablet Take 1 tablet (4 mg total) by mouth daily as needed for nausea or vomiting. 30 tablet 0  . Polysaccharide Iron Complex 125 MG/5ML LIQD Take 125 mg by mouth daily. 180 mL 1  . RESTASIS 0.05 % ophthalmic emulsion 1 drop 2 (two) times daily.    . sucralfate (CARAFATE) 1 g tablet TAKE 1 TABLET (1 G TOTAL) BY MOUTH EVERY 6 (SIX) HOURS. SLOWLY DISSOLVE TABLET IN 1 TABLESPOON OF DISTILLED WATER BEFORE INGESTION 90 tablet 2  . valACYclovir (VALTREX) 1000 MG tablet Take 1 tablet (1,000 mg total) by mouth 2 (two) times daily. 20 tablet 0  . Vitamin D, Ergocalciferol, (DRISDOL) 1.25 MG (50000 UNIT) CAPS capsule 1 po q Sun 4 capsule 0  . zolpidem (AMBIEN CR) 12.5 MG CR tablet Take 12.5 mg by mouth at bedtime as needed.     No current facility-administered medications for this visit.    REVIEW OF SYSTEMS:   10 Point review of Systems was done is negative except as noted above.  PHYSICAL EXAMINATION: ECOG PERFORMANCE STATUS: 1 - Symptomatic but completely ambulatory  . Vitals:   06/07/20 1326  BP: 110/68  Pulse: 64  Resp: 20  Temp: 97.8 F (36.6 C)  SpO2: 100%   Filed Weights   06/07/20 1326  Weight: 217 lb 6.4 oz (98.6 kg)   .Body mass index is 37.32 kg/m.   GENERAL:alert, in no acute distress and comfortable SKIN: no acute rashes, no significant lesions EYES: conjunctiva are pink and non-injected, sclera anicteric OROPHARYNX: MMM, no  exudates, no oropharyngeal erythema or ulceration NECK: supple, no JVD LYMPH:  no palpable lymphadenopathy in the cervical, axillary or inguinal regions LUNGS: clear to auscultation b/l with normal respiratory effort HEART: regular rate & rhythm ABDOMEN:  normoactive bowel sounds , non tender, not distended. Extremity: no pedal edema PSYCH: alert & oriented x 3 with fluent speech NEURO: no focal motor/sensory deficits  LABORATORY DATA:  I have reviewed the data as listed  . CBC Latest Ref Rng & Units 06/07/2020 03/07/2020 02/16/2020  WBC 4.0 - 10.5 K/uL 4.4 7.5 6.5  Hemoglobin 12.0 - 15.0 g/dL 10.9(L) 11.2(L) 10.7(L)  Hematocrit 36.0 - 46.0 % 34.9(L) 36.4 33.5(L)  Platelets 150 - 400 K/uL 294 356 278.0   . CBC    Component Value Date/Time   WBC 4.4 06/07/2020 1314   WBC 7.5 03/07/2020 1236   RBC 4.36 06/07/2020 1314   HGB 10.9 (L) 06/07/2020 1314   HGB 11.0 (L) 01/04/2020 1046   HCT 34.9 (L) 06/07/2020 1314   HCT 35.8 01/04/2020 1046   PLT 294 06/07/2020 1314   PLT 345 01/04/2020 1046   MCV 80.0 06/07/2020 1314   MCV 77 (L) 01/04/2020 1046   MCH 25.0 (L) 06/07/2020 1314   MCHC 31.2 06/07/2020 1314   RDW 13.6 06/07/2020 1314   RDW 13.9 01/04/2020 1046   LYMPHSABS 2.3 06/07/2020 1314   LYMPHSABS 1.9 01/04/2020 1046   MONOABS 0.4 06/07/2020 1314   EOSABS 0.1 06/07/2020 1314   EOSABS 0.1 01/04/2020 1046   BASOSABS 0.0 06/07/2020 1314   BASOSABS 0.0 01/04/2020 1046    . CMP Latest Ref  Rng & Units 06/07/2020 03/07/2020 01/04/2020  Glucose 70 - 99 mg/dL 85 88 94  BUN 6 - 20 mg/dL 10 11 11   Creatinine 0.44 - 1.00 mg/dL 2.20 2.54  Sodium 135 - 145 mmol/L 140 139 137  Potassium 3.5 - 5.1 mmol/L 4.3 3.8 4.2  Chloride 98 - 111 mmol/L 105 103 99  CO2 22 - 32 mmol/L 29 29 26   Calcium 8.9 - 10.3 mg/dL 9.1 9.0 9.3  Total Protein 6.5 - 8.1 g/dL 7.4 7.8 7.4  Total Bilirubin 0.3 - 1.2 mg/dL 0.3 0.3 0.3  Alkaline Phos 38 - 126 U/L 43 45 74  AST 15 - 41 U/L 22 17 20   ALT 0 - 44  U/L 23 25 19    . Lab Results  Component Value Date   IRON 47 06/07/2020   TIBC 344 06/07/2020   IRONPCTSAT 14 (L) 06/07/2020   (Iron and TIBC)  Lab Results  Component Value Date   FERRITIN 59 06/07/2020   Alpha-Thalassemia GenotypR Order: 08/05/2020  Status: Final result   Visible to patient: Yes (seen)   Next appt: 06/19/2020 at 10:40 AM in Family Medicine (Deborah Opalski, DO)   Dx: Microcytic anemia   0 Result Notes  Component 3 mo ago  Alpha-Thalassemia Comment:   Comment: (NOTE)  Test: Alpha-Thalassemia, DNA Analysis  Result:    Alpha-+-thalassemia trait, alpha alpha/alpha-  Mutation(s) identified: alpha3.7  Interpretation:  This result is most consistent with this individual being an  unaffected carrier of alpha-thalassemia with a single gene  deletion, also called alpha-+-thalassemia trait.       RADIOGRAPHIC STUDIES: I have personally reviewed the radiological images as listed and agreed with the findings in the report. No results found.  ASSESSMENT & PLAN:   45 yo female with   1) Microcytic Anemia due to iron deficiency 2) Alpha thalassemia  PLAN: -Discussed patient's most recent labs from 06/07/2020; blood counts stable and MCV improved. -Discussed pt's labs from prior visit that were in progress. The pt understands these findings. 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 This is an inherited trait, not the disease. -Discussed pt's current level of pain and potential causes. -Advise pt we will continue to monitor Iron levels and observe how body is tolerating OTC iron pills. No need for IV Iron at this time due to only borderline lower levels. -Advised pt that goal Ferritin >50 and Iron Sat >20. -Continue to f/u w Rheumatologist regarding if anemia caused by inflammatory issue. -Continue daily OTC Iron pills. Continue to eat Iron rich foods. -Will see back prn. Pt f/u w PCP.     FOLLOW UP: RTC with Dr [phone] as needed   All of the patients questions were answered with apparent satisfaction. The patient knows to call the clinic with any problems, questions or concerns.  The total time spent in the appointment was 20 minutes and more than 50% was on counseling and direct patient cares.    06/21/2020 MD MS AAHIVMS [facility] Hematology/Oncology Physician [facility]  (Office):       [phone] (Work cell):  [phone] (Fax):           [phone]  06/07/2020 1:58 PM  I, [phone], am acting as scribe for Dr. [phone], MD.   .I have reviewed the above documentation for accuracy and completeness, and I agree with the above. [phone] MD

## 2020-06-07 ENCOUNTER — Other Ambulatory Visit: Payer: Self-pay

## 2020-06-07 ENCOUNTER — Inpatient Hospital Stay: Payer: 59 | Attending: Hematology

## 2020-06-07 ENCOUNTER — Inpatient Hospital Stay (HOSPITAL_BASED_OUTPATIENT_CLINIC_OR_DEPARTMENT_OTHER): Payer: 59 | Admitting: Hematology

## 2020-06-07 VITALS — BP 110/68 | HR 64 | Temp 97.8°F | Resp 20 | Ht 64.0 in | Wt 217.4 lb

## 2020-06-07 DIAGNOSIS — D509 Iron deficiency anemia, unspecified: Secondary | ICD-10-CM

## 2020-06-07 DIAGNOSIS — D560 Alpha thalassemia: Secondary | ICD-10-CM | POA: Diagnosis not present

## 2020-06-07 LAB — CBC WITH DIFFERENTIAL (CANCER CENTER ONLY)
Abs Immature Granulocytes: 0 10*3/uL (ref 0.00–0.07)
Basophils Absolute: 0 10*3/uL (ref 0.0–0.1)
Basophils Relative: 1 %
Eosinophils Absolute: 0.1 10*3/uL (ref 0.0–0.5)
Eosinophils Relative: 1 %
HCT: 34.9 % — ABNORMAL LOW (ref 36.0–46.0)
Hemoglobin: 10.9 g/dL — ABNORMAL LOW (ref 12.0–15.0)
Immature Granulocytes: 0 %
Lymphocytes Relative: 51 %
Lymphs Abs: 2.3 10*3/uL (ref 0.7–4.0)
MCH: 25 pg — ABNORMAL LOW (ref 26.0–34.0)
MCHC: 31.2 g/dL (ref 30.0–36.0)
MCV: 80 fL (ref 80.0–100.0)
Monocytes Absolute: 0.4 10*3/uL (ref 0.1–1.0)
Monocytes Relative: 9 %
Neutro Abs: 1.7 10*3/uL (ref 1.7–7.7)
Neutrophils Relative %: 38 %
Platelet Count: 294 10*3/uL (ref 150–400)
RBC: 4.36 MIL/uL (ref 3.87–5.11)
RDW: 13.6 % (ref 11.5–15.5)
WBC Count: 4.4 10*3/uL (ref 4.0–10.5)
nRBC: 0 % (ref 0.0–0.2)

## 2020-06-07 LAB — CMP (CANCER CENTER ONLY)
ALT: 23 U/L (ref 0–44)
AST: 22 U/L (ref 15–41)
Albumin: 4.1 g/dL (ref 3.5–5.0)
Alkaline Phosphatase: 43 U/L (ref 38–126)
Anion gap: 6 (ref 5–15)
BUN: 10 mg/dL (ref 6–20)
CO2: 29 mmol/L (ref 22–32)
Calcium: 9.1 mg/dL (ref 8.9–10.3)
Chloride: 105 mmol/L (ref 98–111)
Creatinine: 0.88 mg/dL (ref 0.44–1.00)
GFR, Estimated: >60 mL/min (ref >60)
Glucose, Bld: 85 mg/dL (ref 70–99)
Potassium: 4.3 mmol/L (ref 3.5–5.1)
Sodium: 140 mmol/L (ref 135–145)
Total Bilirubin: 0.3 mg/dL (ref 0.3–1.2)
Total Protein: 7.4 g/dL (ref 6.5–8.1)

## 2020-06-07 LAB — IRON AND TIBC
Iron: 47 ug/dL (ref 41–142)
Saturation Ratios: 14 % — ABNORMAL LOW (ref 21–57)
TIBC: 344 ug/dL (ref 236–444)
UIBC: 297 ug/dL (ref 120–384)

## 2020-06-07 LAB — FERRITIN: Ferritin: 59 ng/mL (ref 11–307)

## 2020-06-08 NOTE — Progress Notes (Signed)
Office Visit Note  Patient: [name]             Date of Birth: 04/26/1976           MRN: [id number]             PCP: [name], FNP Referring: [name], FNP Visit Date: 06/21/2020 Occupation: @GUAROCC @  Subjective:  Joint pain, muscle pain and facial weakness.   History of Present Illness: [name] is a 45 y.o. female with history of positive ANA, joint pain and muscle pain.  She states she continues to have dry mouth and dry eye symptoms.  She states the dry mouth and dry her symptoms are not as severe although she is concerned about the facial weakness on the right side of her face.  She still notices some drooling when she drinks.  She is waiting to see neurologist and rheumatologist at [facility].  She continues to have some muscle pain and joint pain.  She states joint pain is mostly in the shoulders and in her hands.  She gives history of intermittent urticaria.  Activities of Daily Living:  Patient reports morning stiffness for 30-60 minutes.   Patient Denies nocturnal pain.  Difficulty dressing/grooming: Denies Difficulty climbing stairs: Denies Difficulty getting out of chair: Denies Difficulty using hands for taps, buttons, cutlery, and/or writing: Denies  Review of Systems  Constitutional: Positive for fatigue. Negative for night sweats, weight gain and weight loss.  HENT: Positive for mouth dryness and nose dryness. Negative for mouth sores, trouble swallowing and trouble swallowing.   Eyes: Positive for itching and dryness. Negative for pain, redness and visual disturbance.  Respiratory: Negative for cough, shortness of breath and difficulty breathing.   Cardiovascular: Negative for chest pain, palpitations, hypertension, irregular heartbeat and swelling in legs/feet.  Gastrointestinal: Positive for constipation and diarrhea. Negative for blood in stool.  Endocrine: Negative for increased urination.  Genitourinary: Negative for difficulty urinating  and vaginal dryness.  Musculoskeletal: Positive for arthralgias, joint pain, myalgias, morning stiffness and myalgias. Negative for joint swelling, muscle weakness and muscle tenderness.  Skin: Negative for color change, rash, hair loss, redness, skin tightness, ulcers and sensitivity to sunlight.  Allergic/Immunologic: Negative for susceptible to infections.  Neurological: Positive for numbness and headaches. Negative for dizziness, memory loss, night sweats and weakness.  Hematological: Positive for bruising/bleeding tendency. Negative for swollen glands.  Psychiatric/Behavioral: Positive for sleep disturbance. Negative for depressed mood and confusion. The patient is not nervous/anxious.     PMFS History:  Patient Active Problem List   Diagnosis Date Noted  . Other hyperlipidemia 05/15/2020  . At risk for osteoporosis 05/15/2020  . At risk for impaired metabolic function 04/04/2020  . Other fatigue 03/22/2020  . SOBOE (shortness of breath on exertion) 03/22/2020  . Exertional dyspnea 03/14/2020  . Neuropathy, right side of face 03/08/2020  . At risk for depression 03/08/2020  . Primary osteoarthritis of both feet 02/26/2020  . Vitamin D deficiency, unspecified 01/26/2020  . Hyperlipidemia 01/26/2020  . Prediabetes 01/26/2020  . At risk for heart disease 01/26/2020  . Daytime somnolence 01/26/2020  . Vitamin D deficiency 01/04/2020  . Fatty liver 01/04/2020  . B12 deficiency 01/04/2020  . Moderate persistent asthma without complication 12/31/2019  . Inappropriate sinus tachycardia 12/08/2019  . Class 3 severe obesity with serious comorbidity and body mass index (BMI) of 40.0 to 44.9 in adult (HCC) 12/08/2019  . Apnea 12/08/2019  . Anaphylactic reaction due to food, subsequent encounter 11/22/2019  .  Allergic rhinoconjunctivitis 11/22/2019  . Idiopathic urticaria 11/22/2019  . Generalized anxiety disorder 04/08/2018  . Atypical chest pain   . Regurgitation of food   .  Laryngopharyngeal reflux (LPR) 07/30/2017  . Throat soreness 07/30/2017  . ANA positive 12/30/2016  . Gastroesophageal reflux disease 06/27/2016  . Disturbance of skin sensation 04/27/2014  . Dizziness and giddiness 03/14/2014  . Headache, migraine 03/14/2014  . Myalgia and myositis, unspecified 06/07/2013  . Nausea and vomiting in adult 08/22/2012  . Viral syndrome 08/22/2012  . Pelvic pain in female 12/18/2010  . PID (acute pelvic inflammatory disease) 12/18/2010  . ANEMIA-IRON DEFICIENCY 10/15/2006  . HERPES, GENITAL NOS 09/30/2006  . Obesity (BMI 35.0-39.9 without comorbidity) 09/30/2006  . DISORDER, NONORGANIC SLEEP NOS 09/30/2006  . CONSTIPATION NOS 09/30/2006  . NEURITIS, LUMBOSACRAL NOS 09/30/2006  . HX, PERSONAL, MENTAL DISORDER NOS 09/30/2006    Past Medical History:  Diagnosis Date  . Allergy   . Anemia   . Angio-edema   . Anxiety   . Apnea 12/08/2019  . Arthritis   . Asthma   . Back pain   . Constipation   . Exertional dyspnea 03/14/2020  . Fatty liver   . GERD (gastroesophageal reflux disease)   . H/O: hysterectomy   . Hx of endometriosis   . Inappropriate sinus tachycardia 12/08/2019  . Insomnia   . Joint pain   . Lactose intolerance   . Macromastia 07/2011  . Migraine   . Morbid obesity (HCC) 12/08/2019  . Osteoporosis   . Urticaria   . Vitamin D deficiency     Family History  Problem Relation Age of Onset  . Diabetes Mother   . Hypertension Mother   . Hyperlipidemia Mother   . Sleep apnea Mother   . Thyroid disease Sister   . Dementia Father   . Heart attack Maternal Grandmother   . Stroke Maternal Grandfather   . Asthma Son   . Healthy Son   . Asthma Daughter   . Healthy Daughter   . Colon cancer Neg Hx   . Colon polyps Neg Hx   . Esophageal cancer Neg Hx   . Rectal cancer Neg Hx   . Stomach cancer Neg Hx    Past Surgical History:  Procedure Laterality Date  . 24 HOUR PH STUDY N/A 01/12/2018   Procedure: 24 HOUR PH STUDY;  Surgeon:  Napoleon Form, MD;  Location: [facility];  Service: Endoscopy;  Laterality: N/A;  . ABDOMINAL HYSTERECTOMY  12/12/2003  . ADENOIDECTOMY  05/2011  . ANKLE ARTHROSCOPY  12/29/2007   right; with extensive debridement  . BLADDER NECK RECONSTRUCTION  01/14/2011   Procedure: BLADDER NECK REPAIR;  Surgeon: Reva Bores, MD;  Location: [facility];  Service: Gynecology;  Laterality: N/A;  Laparoscopic Repair of Incidental Cystotomy  . BREAST REDUCTION SURGERY  08/05/2011   Procedure: MAMMARY REDUCTION  (BREAST);  Surgeon: Louisa Second, MD;  Location: [facility];  Service: Plastics;  Laterality: Bilateral;  bilateral  . BREAST SURGERY  2012   breast reduction  . CESAREAN SECTION  12/29/2000; 1994  . DILATION AND CURETTAGE OF UTERUS  07/08/2003   open laparoscopy  . ESOPHAGEAL MANOMETRY N/A 01/12/2018   Procedure: ESOPHAGEAL MANOMETRY (EM);  Surgeon: Napoleon Form, MD;  Location: [facility];  Service: Endoscopy;  Laterality: N/A;  . GIVENS CAPSULE STUDY N/A 06/23/2017   Procedure: GIVENS CAPSULE STUDY;  Surgeon: Benancio Deeds, MD;  Location[facility];  Service: Gastroenterology;  Laterality: N/A;  .  LAPAROSCOPIC SALPINGOOPHERECTOMY  01/14/2011   left  . PH IMPEDANCE STUDY N/A 01/12/2018   Procedure: PH IMPEDANCE STUDY;  Surgeon: Napoleon Form, MD;  Location: [facility];  Service: Endoscopy;  Laterality: N/A;  . REPAIR PERONEAL TENDONS ANKLE  01/03/2010   repair right subluxing peroneal tendons  . RESECTION DISTAL CLAVICAL Right 09/25/2017   Procedure: RESECTION DISTAL CLAVICAL;  Surgeon: Bjorn Pippin, MD;  Location: [facility];  Service: Orthopedics;  Laterality: Right;  . RIGHT OOPHORECTOMY     with lysis of adhesions  . SHOULDER ACROMIOPLASTY Right 09/25/2017   Procedure: SHOULDER ACROMIOPLASTY;  Surgeon: Bjorn Pippin, MD;  Location: [facility];  Service: Orthopedics;  Laterality: Right;  . SHOULDER ARTHROSCOPY WITH DEBRIDEMENT AND  BICEP TENDON REPAIR Right 09/25/2017   Procedure: SHOULDER ARTHROSCOPY WITH DEBRIDEMENT AND BICEP TENDON REPAIR;  Surgeon: Bjorn Pippin, MD;  Location: [facility];  Service: Orthopedics;  Laterality: Right;  . SHOULDER ARTHROSCOPY WITH ROTATOR CUFF REPAIR Right 09/25/2017   Procedure: SHOULDER ARTHROSCOPY WITH ROTATOR CUFF REPAIR;  Surgeon: Bjorn Pippin, MD;  Location: [facility];  Service: Orthopedics;  Laterality: Right;  . TONSILLECTOMY  as a child  . TUBAL LIGATION  12/29/2000  . UPPER GASTROINTESTINAL ENDOSCOPY     Social History   Social History Narrative   Pt lives in 2 story town home with her son   Has 2 children   High school graduate   Bus driver for [employer]   Right handed   Immunization History  Administered Date(s) Administered  . Influenza,inj,Quad PF,6+ Mos 02/15/2016, 12/24/2017, 02/24/2019, 04/20/2020  . Td 05/06/1994  . Tdap 12/06/2015     Objective: Vital Signs: BP 112/77 (BP Location: Left Arm, Patient Position: Sitting, Cuff Size: Large)   Pulse (!) 56   Resp 17   Ht 5\' 4"  (1.626 m)   Wt 216 lb (98 kg)   LMP 12/27/2003   BMI 37.08 kg/m    Physical Exam Vitals and nursing note reviewed.  Constitutional:      Appearance: She is well-developed and well-nourished.  HENT:     Head: Normocephalic and atraumatic.  Eyes:     Extraocular Movements: EOM normal.     Conjunctiva/sclera: Conjunctivae normal.  Cardiovascular:     Rate and Rhythm: Normal rate and regular rhythm.     Pulses: Intact distal pulses.     Heart sounds: Normal heart sounds.  Pulmonary:     Effort: Pulmonary effort is normal.     Breath sounds: Normal breath sounds.  Abdominal:     General: Bowel sounds are normal.     Palpations: Abdomen is soft.  Musculoskeletal:     Cervical back: Normal range of motion.  Lymphadenopathy:     Cervical: No cervical adenopathy.  Skin:    General: Skin is warm and dry.     Capillary Refill: Capillary  refill takes less than 2 seconds.  Neurological:     Mental Status: She is alert and oriented to person, place, and time.  Psychiatric:        Mood and Affect: Mood and affect normal.        Behavior: Behavior normal.      Musculoskeletal Exam: C-spine was in good range of motion.  Shoulder joints, elbow joints, wrist joints, MCPs PIPs and DIPs with good range of motion with no synovitis.  Hip joints, knee joints, ankles, MTPs and PIPs with good range of motion with no  synovitis.  CDAI Exam: CDAI Score: -- Patient Global: --; Provider Global: -- Swollen: --; Tender: -- Joint Exam 06/21/2020   No joint exam has been documented for this visit   There is currently no information documented on the homunculus. Go to the Rheumatology activity and complete the homunculus joint exam.  Investigation: No additional findings.  Imaging: No results found.  Recent Labs: Lab Results  Component Value Date   WBC 4.4 06/07/2020   HGB 10.9 (L) 06/07/2020   PLT 294 06/07/2020   NA 140 06/07/2020   K 4.3 06/07/2020   CL 105 06/07/2020   CO2 29 06/07/2020   GLUCOSE 85 06/07/2020   BUN 10 06/07/2020   CREATININE 0.88 06/07/2020   BILITOT 0.3 06/07/2020   ALKPHOS 43 06/07/2020   AST 22 06/07/2020   ALT 23 06/07/2020   PROT 7.4 06/07/2020   ALBUMIN 4.1 06/07/2020   CALCIUM 9.1 06/07/2020   GFRAA 105 01/04/2020    Speciality Comments: No specialty comments available.  Procedures:  No procedures performed Allergies: Aspirin, Ibuprofen, Salmon [fish allergy], and Zoledronic acid   Assessment / Plan:     Visit Diagnoses: Positive ANA (antinuclear antibody) - ANA low titer, nonspecific, ENA negative, history of dry mouth and joint pain. scheduled with [facility] rheumatology in April 2022.  Over-the-counter products were discussed.  Pain in both hands-she complains of discomfort in her bilateral hands.  No synovitis was noted.  Primary osteoarthritis of both feet - X-rays obtained were  consistent with early osteoarthritis.  She had no tenderness on examination.  Facial muscle weakness - referral placed at the last visit for [facility] neurology per patient's request.  Myalgia - CK is mildly elevated which is not significantly high.  Other medical problems are listed as follows:  Idiopathic urticaria - History of intermittent urticaria.  Moderate persistent asthma without complication  Laryngopharyngeal reflux (LPR)  Fatty liver  History of hyperlipidemia  Prediabetes  Allergic rhinoconjunctivitis  Generalized anxiety disorder  Vitamin D deficiency  B12 deficiency  Other iron deficiency anemia  Orders: No orders of the defined types were placed in this encounter.  No orders of the defined types were placed in this encounter.    Follow-Up Instructions: Return if symptoms worsen or fail to improve, for +ANA, Osteoarthritis.  She wants to return on as needed basis only.   Pollyann Savoy, MD  Note - This record has been created using Animal nutritionist.  Chart creation errors have been sought, but may not always  have been located. Such creation errors do not reflect on  the standard of medical care.

## 2020-06-09 ENCOUNTER — Telehealth: Payer: Self-pay

## 2020-06-09 NOTE — Telephone Encounter (Signed)
Call to pt to confirm start date of PREP for Tues 2/8 at 1pm at [facility] to starting then

## 2020-06-19 ENCOUNTER — Other Ambulatory Visit: Payer: Self-pay

## 2020-06-19 ENCOUNTER — Encounter (INDEPENDENT_AMBULATORY_CARE_PROVIDER_SITE_OTHER): Payer: Self-pay | Admitting: Family Medicine

## 2020-06-19 ENCOUNTER — Telehealth (INDEPENDENT_AMBULATORY_CARE_PROVIDER_SITE_OTHER): Payer: 59 | Admitting: Family Medicine

## 2020-06-19 VITALS — BP 117/70

## 2020-06-19 DIAGNOSIS — Z9189 Other specified personal risk factors, not elsewhere classified: Secondary | ICD-10-CM

## 2020-06-19 DIAGNOSIS — E559 Vitamin D deficiency, unspecified: Secondary | ICD-10-CM

## 2020-06-19 DIAGNOSIS — R7303 Prediabetes: Secondary | ICD-10-CM | POA: Diagnosis not present

## 2020-06-19 DIAGNOSIS — Z6836 Body mass index (BMI) 36.0-36.9, adult: Secondary | ICD-10-CM

## 2020-06-19 MED ORDER — VITAMIN D (ERGOCALCIFEROL) 1.25 MG (50000 UNIT) PO CAPS
ORAL_CAPSULE | ORAL | 0 refills | Status: DC
Start: 2020-06-19 — End: 2020-07-10

## 2020-06-21 ENCOUNTER — Other Ambulatory Visit: Payer: Self-pay

## 2020-06-21 ENCOUNTER — Ambulatory Visit: Payer: 59 | Admitting: Rheumatology

## 2020-06-21 ENCOUNTER — Encounter: Payer: Self-pay | Admitting: Rheumatology

## 2020-06-21 VITALS — BP 112/77 | HR 56 | Resp 17 | Ht 64.0 in | Wt 216.0 lb

## 2020-06-21 DIAGNOSIS — D508 Other iron deficiency anemias: Secondary | ICD-10-CM

## 2020-06-21 DIAGNOSIS — M19071 Primary osteoarthritis, right ankle and foot: Secondary | ICD-10-CM | POA: Diagnosis not present

## 2020-06-21 DIAGNOSIS — M19072 Primary osteoarthritis, left ankle and foot: Secondary | ICD-10-CM

## 2020-06-21 DIAGNOSIS — J309 Allergic rhinitis, unspecified: Secondary | ICD-10-CM

## 2020-06-21 DIAGNOSIS — L501 Idiopathic urticaria: Secondary | ICD-10-CM

## 2020-06-21 DIAGNOSIS — Z8639 Personal history of other endocrine, nutritional and metabolic disease: Secondary | ICD-10-CM

## 2020-06-21 DIAGNOSIS — M791 Myalgia, unspecified site: Secondary | ICD-10-CM | POA: Diagnosis not present

## 2020-06-21 DIAGNOSIS — J4540 Moderate persistent asthma, uncomplicated: Secondary | ICD-10-CM

## 2020-06-21 DIAGNOSIS — R29810 Facial weakness: Secondary | ICD-10-CM

## 2020-06-21 DIAGNOSIS — H1010 Acute atopic conjunctivitis, unspecified eye: Secondary | ICD-10-CM

## 2020-06-21 DIAGNOSIS — R768 Other specified abnormal immunological findings in serum: Secondary | ICD-10-CM | POA: Diagnosis not present

## 2020-06-21 DIAGNOSIS — M79641 Pain in right hand: Secondary | ICD-10-CM

## 2020-06-21 DIAGNOSIS — E538 Deficiency of other specified B group vitamins: Secondary | ICD-10-CM

## 2020-06-21 DIAGNOSIS — R7303 Prediabetes: Secondary | ICD-10-CM

## 2020-06-21 DIAGNOSIS — M79642 Pain in left hand: Secondary | ICD-10-CM

## 2020-06-21 DIAGNOSIS — E559 Vitamin D deficiency, unspecified: Secondary | ICD-10-CM

## 2020-06-21 DIAGNOSIS — K219 Gastro-esophageal reflux disease without esophagitis: Secondary | ICD-10-CM

## 2020-06-21 DIAGNOSIS — F411 Generalized anxiety disorder: Secondary | ICD-10-CM

## 2020-06-21 DIAGNOSIS — K760 Fatty (change of) liver, not elsewhere classified: Secondary | ICD-10-CM

## 2020-06-26 NOTE — Progress Notes (Signed)
TeleHealth Visit:  Due to the COVID-19 pandemic, this visit was completed with telemedicine (audio/video) technology to reduce patient and provider exposure as well as to preserve personal protective equipment.   [name] has verbally consented to this TeleHealth visit. The patient is located at home, the provider is located at the [facility] office. The participants in this visit include the listed provider and patient and. The visit was conducted today via MyChart.  OBESITY [name] is here to discuss her progress with her obesity treatment plan along with follow-up of her obesity related diagnoses.   Today's visit was #: 10 Starting weight: 239 lbs Starting date: 01/04/2020 Today's date: 06/19/2020  Interim History: [name] is here for a follow up office visit.  she is following the meal plan without concern or issues.  Patient's meal and food recall appears to be accurate and consistent with what is on the plan.  When on plan, her hunger and cravings are well controlled.    Nutrition Plan: Category 2 Plan with breakfast options. Activity: Elliptical for 30 minutes 2-3 times per week.  Assessment/Plan:   1. Prediabetes At goal. Goal is HgbA1c < 5.7.  Medication: None.  Denies cravings for sweets.  A1c 5.6 in 04/2020.  Plan:  She will continue to focus on protein-rich, low simple carbohydrate foods. We reviewed the importance of hydration, regular exercise for stress reduction, and restorative sleep.   Lab Results  Component Value Date   HGBA1C 5.6 04/18/2020   Lab Results  Component Value Date   INSULIN 8.0 01/04/2020   2. Vitamin D deficiency At goal. Current vitamin D is 99.1, tested on 04/18/2020. Optimal goal > 50 ng/dL.   Plan: Continue to take prescription Vitamin D @50 ,000 IU every week as prescribed.  Follow-up for routine testing of Vitamin D, at least 2-3 times per year to avoid over-replacement.  - Refill Vitamin D, Ergocalciferol,  (DRISDOL) 1.25 MG (50000 UNIT) CAPS capsule; 1 po q Sun  Dispense: 4 capsule; Refill: 0  3. Class 2 severe obesity with serious comorbidity and body mass index (BMI) of 36.0 to 36.9 in adult, unspecified obesity type [facility])  Course: [name] is currently in the action stage of change. As such, her goal is to continue with weight loss efforts.   Nutrition goals: She has agreed to the Category 2 Plan with breakfast options.   Exercise goals: For substantial health benefits, adults should do at least 150 minutes (2 hours and 30 minutes) a week of moderate-intensity, or 75 minutes (1 hour and 15 minutes) a week of vigorous-intensity aerobic physical activity, or an equivalent combination of moderate- and vigorous-intensity aerobic activity. Aerobic activity should be performed in episodes of at least 10 minutes, and preferably, it should be spread throughout the week.  Behavioral modification strategies: increasing lean protein intake, decreasing simple carbohydrates, better snacking choices and planning for success.  [name] has agreed to follow-up with our clinic in 2-3 weeks. She was informed of the importance of frequent follow-up visits to maximize her success with intensive lifestyle modifications for her multiple health conditions.   Objective:   Blood pressure 117/70, last menstrual period 12/27/2003. There is no height or weight on file to calculate BMI.  General: Cooperative, alert, well developed, in no acute distress. HEENT: Conjunctivae and lids unremarkable. Cardiovascular: Regular rhythm.  Lungs: Normal work of breathing. Neurologic: No focal deficits.   Lab Results  Component Value Date   CREATININE 0.88 06/07/2020   BUN 10  06/07/2020   NA 140 06/07/2020   K 4.3 06/07/2020   CL 105 06/07/2020   CO2 29 06/07/2020   Lab Results  Component Value Date   ALT 23 06/07/2020   AST 22 06/07/2020   ALKPHOS 43 06/07/2020   BILITOT 0.3 06/07/2020   Lab Results  Component Value  Date   HGBA1C 5.6 04/18/2020   HGBA1C 5.7 (H) 01/04/2020   HGBA1C 5.7 (H) 06/17/2019   HGBA1C 5.1 12/24/2017   HGBA1C 5.1 06/16/2017   Lab Results  Component Value Date   INSULIN 8.0 01/04/2020   Lab Results  Component Value Date   TSH 0.872 01/04/2020   Lab Results  Component Value Date   CHOL 187 04/18/2020   HDL 58 04/18/2020   LDLCALC 116 (H) 04/18/2020   TRIG 68 04/18/2020   CHOLHDL 3.2 04/18/2020   Lab Results  Component Value Date   WBC 4.4 06/07/2020   HGB 10.9 (L) 06/07/2020   HCT 34.9 (L) 06/07/2020   MCV 80.0 06/07/2020   PLT 294 06/07/2020   Lab Results  Component Value Date   IRON 47 06/07/2020   TIBC 344 06/07/2020   FERRITIN 59 06/07/2020   Attestation Statements:   Reviewed by clinician on day of visit: allergies, medications, problem list, medical history, surgical history, family history, social history, and previous encounter notes.  I, Insurance claims handler, CMA, am acting as Energy manager for [employer], DO.  I have reviewed the above documentation for accuracy and completeness, and I agree with the above. Carlye Grippe, D.O.  The 21st Century Cures Act was signed into law in 2016 which includes the topic of electronic health records.  This provides immediate access to information in MyChart.  This includes consultation notes, operative notes, office notes, lab results and pathology reports.  If you have any questions about what you read please let us know at your next visit so we can discuss your concerns and take corrective action if need be.  We are right here with you.

## 2020-07-03 ENCOUNTER — Encounter: Payer: Self-pay | Admitting: Nurse Practitioner

## 2020-07-05 ENCOUNTER — Ambulatory Visit (INDEPENDENT_AMBULATORY_CARE_PROVIDER_SITE_OTHER): Payer: 59 | Admitting: Family Medicine

## 2020-07-07 ENCOUNTER — Ambulatory Visit (INDEPENDENT_AMBULATORY_CARE_PROVIDER_SITE_OTHER): Payer: 59 | Admitting: Family Medicine

## 2020-07-07 ENCOUNTER — Encounter: Payer: Self-pay | Admitting: Family Medicine

## 2020-07-07 ENCOUNTER — Encounter (INDEPENDENT_AMBULATORY_CARE_PROVIDER_SITE_OTHER): Payer: Self-pay | Admitting: Family Medicine

## 2020-07-07 ENCOUNTER — Other Ambulatory Visit: Payer: Self-pay

## 2020-07-07 VITALS — BP 112/68 | HR 65 | Temp 98.0°F | Resp 18 | Ht 64.17 in | Wt 213.8 lb

## 2020-07-07 DIAGNOSIS — T7800XD Anaphylactic reaction due to unspecified food, subsequent encounter: Secondary | ICD-10-CM

## 2020-07-07 DIAGNOSIS — K219 Gastro-esophageal reflux disease without esophagitis: Secondary | ICD-10-CM

## 2020-07-07 DIAGNOSIS — H1013 Acute atopic conjunctivitis, bilateral: Secondary | ICD-10-CM

## 2020-07-07 DIAGNOSIS — L501 Idiopathic urticaria: Secondary | ICD-10-CM

## 2020-07-07 DIAGNOSIS — J4540 Moderate persistent asthma, uncomplicated: Secondary | ICD-10-CM

## 2020-07-07 DIAGNOSIS — H1010 Acute atopic conjunctivitis, unspecified eye: Secondary | ICD-10-CM

## 2020-07-07 DIAGNOSIS — J309 Allergic rhinitis, unspecified: Secondary | ICD-10-CM | POA: Diagnosis not present

## 2020-07-07 MED ORDER — EPINEPHRINE 0.3 MG/0.3ML IJ SOAJ: 0.3000 mg | INTRAMUSCULAR | 2 refills | Status: DC | PRN

## 2020-07-07 NOTE — Patient Instructions (Addendum)
Allergic rhinitis Continue allergen avoidance measures directed toward tree pollen, dust mite, and cockroach as listed below, continue an antihistamine once a day as needed for runny nose or itch Continue Flonase 1 to 2 sprays in each nostril once a day as needed for stuffy nose.  In the right nostril, point the applicator out toward the right ear. In the left nostril, point the applicator out toward the left ear Consider saline nasal rinses as needed for nasal symptoms. Use this before any medicated nasal sprays for best result Begin nasal saline gel as needed for dry nostrils  Allergic conjunctivitis Some over the counter eye drops include Pataday one drop in each eye once a day as needed for red, itchy eyes OR Zaditor one drop in each eye twice a day as needed for red itchy eyes.  Hives (urticaria)  . Cetirizine (Zyrtec) 10mg  twice a day and famotidine (Pepcid) 20 mg twice a day. If no symptoms for 7-14 days then decrease to. . Cetirizine (Zyrtec) 10mg  twice a day and famotidine (Pepcid) 20 mg once a day.  If no symptoms for 7-14 days then decrease to. . Cetirizine (Zyrtec) 10mg  twice a day.  If no symptoms for 7-14 days then decrease to. . Cetirizine (Zyrtec) 10mg  once a day.  May use Benadryl (diphenhydramine) as needed for breakthrough hives       If symptoms return, then step up dosage  Keep a detailed symptom journal including foods eaten, contact with allergens, medications taken, weather changes.   Allergic reaction Continue to avoid salmon. In case of an allergic reaction, take Benadryl 50 mg every 4 hours, and if life-threatening symptoms occur, inject with EpiPen 0.3 mg  Asthma Continue your current treatment plan as outlined by [facility] and keep follow up appointments as directed Continue Breo Ellipta 1 puff once a day to prevent cough or wheeze Continue albuterol 2 puffs once every 4 hours as needed for cough or wheeze  Reflux Continue esomeprazole 40 mg  once a day as previously prescribed by your GI specialist Continue dietary and lifestyle modifications as listed below  Call the clinic if this treatment plan is not working well for you  Follow up in 6 months or sooner if needed.  Reducing Pollen Exposure The American Academy of Allergy, Asthma and Immunology suggests the following steps to reduce your exposure to pollen during allergy seasons. 1. Do not hang sheets or clothing out to dry; pollen may collect on these items. 2. Do not mow lawns or spend time around freshly cut grass; mowing stirs up pollen. 3. Keep windows closed at night.  Keep car windows closed while driving. 4. Minimize morning activities outdoors, a time when pollen counts are usually at their highest. 5. Stay indoors as much as possible when pollen counts or humidity is high and on windy days when pollen tends to remain in the air longer. 6. Use air conditioning when possible.  Many air conditioners have filters that trap the pollen spores. 7. Use a HEPA room air filter to remove pollen form the indoor air you breathe.   Control of Dust Mite Allergen Dust mites play a major role in allergic asthma and rhinitis. They occur in environments with high humidity wherever human skin is found. Dust mites absorb humidity from the atmosphere (ie, they do not drink) and feed on organic matter (including shed human and animal skin). Dust mites are a microscopic type of insect that you cannot see with the naked eye. High levels  of dust mites have been detected from mattresses, pillows, carpets, upholstered furniture, bed covers, clothes, soft toys and any woven material. The principal allergen of the dust mite is found in its feces. A gram of dust may contain 1,000 mites and 250,000 fecal particles. Mite antigen is easily measured in the air during house cleaning activities. Dust mites do not bite and do not cause harm to humans, other than by triggering allergies/asthma.  Ways to  decrease your exposure to dust mites in your home:  1. Encase mattresses, box springs and pillows with a mite-impermeable barrier or cover  2. Wash sheets, blankets and drapes weekly in hot water (130 F) with detergent and dry them in a dryer on the hot setting.  3. Have the room cleaned frequently with a vacuum cleaner and a damp dust-mop. For carpeting or rugs, vacuuming with a vacuum cleaner equipped with a high-efficiency particulate air (HEPA) filter. The dust mite allergic individual should not be in a room which is being cleaned and should wait 1 hour after cleaning before going into the room.  4. Do not sleep on upholstered furniture (eg, couches).  5. If possible removing carpeting, upholstered furniture and drapery from the home is ideal. Horizontal blinds should be eliminated in the rooms where the person spends the most time (bedroom, study, television room). Washable vinyl, roller-type shades are optimal.  6. Remove all non-washable stuffed toys from the bedroom. Wash stuffed toys weekly like sheets and blankets above.  7. Reduce indoor humidity to less than 50%. Inexpensive humidity monitors can be purchased at most hardware stores. Do not use a humidifier as can make the problem worse and are not recommended.  Control of Cockroach Allergen  Cockroach allergen has been identified as an important cause of acute attacks of asthma, especially in urban settings.  There are fifty-five species of cockroach that exist in the [place], however only three, the [place], [place] species produce allergen that can affect patients with Asthma.  Allergens can be obtained from fecal particles, egg casings and secretions from cockroaches.    1. Remove food sources. 2. Reduce access to water. 3. Seal access and entry points. 4. Spray runways with 0.5-1% Diazinon or Chlorpyrifos 5. Blow boric acid power under stoves and refrigerator. 6. Place bait stations (hydramethylnon) at  feeding sites.   Lifestyle Changes for Controlling GERD When you have GERD, stomach acid feels as if it's backing up toward your mouth. Whether or not you take medication to control your GERD, your symptoms can often be improved with lifestyle changes.   Raise Your Head  Reflux is more likely to strike when you're lying down flat, because stomach fluid can  flow backward more easily. Raising the head of your bed 4-6 inches can help. To do this:  Slide blocks or books under the legs at the head of your bed. Or, place a wedge under  the mattress. Many foam stores can make a suitable wedge for you. The wedge  should run from your waist to the top of your head.  Don't just prop your head on several pillows. This increases pressure on your  stomach. It can make GERD worse.  Watch Your Eating Habits Certain foods may increase the acid in your stomach or relax the lower esophageal sphincter, making GERD more likely. It's best to avoid the following:  Coffee, tea, and carbonated drinks (with and without caffeine)  Fatty, fried, or spicy food  Mint, chocolate, onions, and tomatoes  Any other foods that seem to irritate your stomach or cause you pain  Relieve the Pressure  Eat smaller meals, even if you have to eat more often.  Don't lie down right after you eat. Wait a few hours for your stomach to empty.  Avoid tight belts and tight-fitting clothes.  Lose excess weight.  Tobacco and Alcohol  Avoid smoking tobacco and drinking alcohol. They can make GERD symptoms worse.

## 2020-07-07 NOTE — Progress Notes (Signed)
[address] [place] [address] Dept: [phone]  FOLLOW UP NOTE  Patient ID: [name], female    DOB: 1975/06/06  Age: 45 y.o. MRN: [id number] Date of Office Visit: 07/07/2020  Assessment  Chief Complaint: Asthma  HPI [name] is a 45 year old female who presents to the clinic for follow-up visit.  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  At today's, visit she reports her asthma has been well controlled with occasional shortness of breath with no wheeze or cough.  She continues Breo Ellipta 200-1 puff once a day and uses albuterol infrequently.  She continues to follow [facility] for asthma management.  Allergic rhinitis is reported as well controlled with occasional clear rhinorrhea and dry nostrils.  She uses either cetirizine or loratadine as needed, Flonase as needed, and saline nasal rinses as needed.  Allergic conjunctivitis is reported as moderately well controlled with a lubricating eyedrop and occasional Pataday.  She reports no urticarial breakouts since her last visit to this clinic.  Reflux is reported as well controlled with dietary and lifestyle modifications as well as daily Nexium.  She has not had an anaphylactic episode since her last visit to this clinic.  She does report that she occasionally eats salmon which causes hives with no cardiopulmonary or gastrointestinal symptoms.  She reports a new problem that started a few months ago which begins with symptoms including sore throat, nerve pain, and raspy voice that begins if she spends a lot of time talking.  She reports this started a cascade of events including voice change, raspy voice, runny nose, and runny watery eyes that can last for several hours.  She has seen an ear nose and throat specialist and is going for further evaluation in April.  Her current  medications are listed in the chart.   Drug Allergies:  Allergies  Allergen Reactions  . Aspirin   . Ibuprofen   . Salmon [Fish Allergy]   . Zoledronic Acid Other (See Comments)    Physical Exam: BP 112/68 (BP Location: Left Arm, Patient Position: Sitting, Cuff Size: Large)   Pulse 65   Temp 98 F (36.7 C) (Temporal)   Resp 18   Ht 5' 4.17" (1.63 m)   Wt 213 lb 12.8 oz (97 kg)   LMP 12/27/2003   SpO2 99%   BMI 36.50 kg/m    Physical Exam Vitals reviewed.  Constitutional:      Appearance: Normal appearance.  HENT:     Head: Normocephalic and atraumatic.     Right Ear: Tympanic membrane normal.     Left Ear: Tympanic membrane normal.     Nose:     Comments: Bilateral nares slightly erythematous with clear nasal drainage noted.  Pharynx normal.  Ears normal.  Eyes normal.    Mouth/Throat:     Pharynx: Oropharynx is clear.  Eyes:     Conjunctiva/sclera: Conjunctivae normal.  Cardiovascular:     Rate and Rhythm: Normal rate and regular rhythm.     Heart sounds: Normal heart sounds. No murmur heard.   Pulmonary:     Effort: Pulmonary effort is normal.     Breath sounds: Normal breath sounds.     Comments: Lungs clear to auscultation Musculoskeletal:        General: Normal range of motion.     Cervical back: Normal range of motion and neck supple.  Skin:    General: Skin is warm and dry.  Neurological:     Mental Status: She is alert and oriented to person, place, and time.  Psychiatric:        Mood and Affect: Mood normal.        Behavior: Behavior normal.        Thought Content: Thought content normal.        Judgment: Judgment normal.      Assessment and Plan: 1. Moderate persistent asthma without complication   2. Anaphylactic reaction due to food, subsequent encounter   3. Allergic rhinoconjunctivitis   4. Gastroesophageal reflux disease, unspecified whether esophagitis present   5. Idiopathic urticaria     Meds ordered this encounter  Medications   . EPINEPHrine (EPIPEN 2-PAK) 0.3 mg/0.3 mL IJ SOAJ injection    Sig: Inject 0.3 mg into the muscle as needed for anaphylaxis.    Dispense:  2 each    Refill:  2    Patient Instructions  Allergic rhinitis Continue allergen avoidance measures directed toward tree pollen, dust mite, and cockroach as listed below, continue an antihistamine once a day as needed for runny nose or itch Continue Flonase 1 to 2 sprays in each nostril once a day as needed for stuffy nose.  In the right nostril, point the applicator out toward the right ear. In the left nostril, point the applicator out toward the left ear Consider saline nasal rinses as needed for nasal symptoms. Use this before any medicated nasal sprays for best result Begin nasal saline gel as needed for dry nostrils  Allergic conjunctivitis Some over the counter eye drops include Pataday one drop in each eye once a day as needed for red, itchy eyes OR Zaditor one drop in each eye twice a day as needed for red itchy eyes.  Hives (urticaria)  . Cetirizine (Zyrtec) 10mg  twice a day and famotidine (Pepcid) 20 mg twice a day. If no symptoms for 7-14 days then decrease to. . Cetirizine (Zyrtec) 10mg  twice a day and famotidine (Pepcid) 20 mg once a day.  If no symptoms for 7-14 days then decrease to. . Cetirizine (Zyrtec) 10mg  twice a day.  If no symptoms for 7-14 days then decrease to. . Cetirizine (Zyrtec) 10mg  once a day.  May use Benadryl (diphenhydramine) as needed for breakthrough hives       If symptoms return, then step up dosage  Keep a detailed symptom journal including foods eaten, contact with allergens, medications taken, weather changes.   Allergic reaction Continue to avoid salmon. In case of an allergic reaction, take Benadryl 50 mg every 4 hours, and if life-threatening symptoms occur, inject with EpiPen 0.3 mg  Asthma Continue your current treatment plan as outlined by [facility] and keep follow up appointments as  directed Continue Breo Ellipta 1 puff once a day to prevent cough or wheeze Continue albuterol 2 puffs once every 4 hours as needed for cough or wheeze  Reflux Continue esomeprazole 40 mg once a day as previously prescribed by your GI specialist Continue dietary and lifestyle modifications as listed below  Call the clinic if this treatment plan is not working well for you  Follow up in 6 months or sooner if needed.   Return in about 6 months (around 01/07/2021), or if symptoms worsen or fail to improve.    Thank you for the opportunity to care for this patient.  Please do not hesitate to contact me with questions.  ,  FNP [facility]

## 2020-07-10 ENCOUNTER — Ambulatory Visit (INDEPENDENT_AMBULATORY_CARE_PROVIDER_SITE_OTHER): Payer: 59 | Admitting: Family Medicine

## 2020-07-10 ENCOUNTER — Other Ambulatory Visit: Payer: Self-pay

## 2020-07-10 ENCOUNTER — Encounter (INDEPENDENT_AMBULATORY_CARE_PROVIDER_SITE_OTHER): Payer: Self-pay | Admitting: Family Medicine

## 2020-07-10 VITALS — BP 116/81 | HR 72 | Temp 97.6°F | Ht 64.0 in | Wt 208.0 lb

## 2020-07-10 DIAGNOSIS — Z6835 Body mass index (BMI) 35.0-35.9, adult: Secondary | ICD-10-CM

## 2020-07-10 DIAGNOSIS — Z9189 Other specified personal risk factors, not elsewhere classified: Secondary | ICD-10-CM | POA: Diagnosis not present

## 2020-07-10 DIAGNOSIS — E559 Vitamin D deficiency, unspecified: Secondary | ICD-10-CM

## 2020-07-10 MED ORDER — VITAMIN D (ERGOCALCIFEROL) 1.25 MG (50000 UNIT) PO CAPS
ORAL_CAPSULE | ORAL | 0 refills | Status: DC
Start: 2020-07-10 — End: 2020-07-26

## 2020-07-10 MED ORDER — VITAMIN D (ERGOCALCIFEROL) 1.25 MG (50000 UNIT) PO CAPS
ORAL_CAPSULE | ORAL | 0 refills | Status: DC
Start: 2020-07-10 — End: 2020-07-10

## 2020-07-11 ENCOUNTER — Ambulatory Visit (INDEPENDENT_AMBULATORY_CARE_PROVIDER_SITE_OTHER): Payer: 59 | Admitting: Family Medicine

## 2020-07-11 NOTE — Progress Notes (Signed)
[facility] PREP Weekly Session   Patient Details  Name: [name] MRN: [id number] Date of Birth: [birth year] Age: 45 y.o. PCP: [name], FNP  Vitals:   07/11/20 1300  Weight: 208 lb (94.3 kg)     [name] Weekly seesion - 07/11/20 1400      Weekly Session   Topic Discussed Restaurant Eating    Minutes exercised this week 30 minutes    Classes attended to date 8            Lora B McPhail 07/11/2020, 2:47 PM

## 2020-07-11 NOTE — Progress Notes (Signed)
Chief Complaint:   OBESITY [name] is here to discuss her progress with her obesity treatment plan along with follow-up of her obesity related diagnoses.   Today's visit was #: 11 Starting weight: 239 lbs Starting date: 01/04/2020 Today's weight: 208 lbs Today's date: 07/10/2020 Total lbs lost to date: 31 lbs Body mass index is 35.7 kg/m.  Total weight loss percentage to date: -12.97%  Interim History:  [name] is here for a follow up office visit.  she is following the meal plan without concern or issues.  Patient's meal and food recall appears to be accurate and consistent with what is on the plan.  When on plan, her hunger and cravings are well controlled.    Current Meal Plan: the Category 2 Plan with breakfast options for 80% of the time.  Current [facility] Plan: [facility] for 60 minutes 2 times per week.  Assessment/Plan:   1. Vitamin D deficiency At goal.  She recently had labs at [facility] [place] [name] and her vitamin D was 85.0.  Optimal goal > 50 ng/dL.  She is taking vitamin D 50,000 IU weekly.  Plan: Continue to take prescription Vitamin D @50 ,000 IU every 2 weeks as prescribed.  Follow-up for routine testing of Vitamin D, at least 2-3 times per year to avoid over-replacement.  - Refill Vitamin D, Ergocalciferol, (DRISDOL) 1.25 MG (50000 UNIT) CAPS capsule; 1 po q 2 wks  Dispense: 2 capsule; Refill: 0  2. At risk for activity intolerance [name] was given approximately 8 minutes of counseling today regarding her increased risk for exercise intolerance.  We discussed patient's specific personal and medical issues that raise our concern.  She was advised of strategies to prevent injury and ways to improve her cardiopulmonary fitness levels slowly over time.  We additionally discussed various fitness trackers and smart phone apps to help motivate the patient to stay on track.   3. Class 2 severe obesity with serious comorbidity and body mass index (BMI) of 35.0 to 35.9 in adult,  unspecified obesity type [name])  Course: [name] is currently in the action stage of change. As such, her goal is to continue with weight loss efforts.   Nutrition goals: She has agreed to the Category 2 Plan with breakfast and lunch options.   Exercise goals: As is.  Behavioral modification strategies: decreasing simple carbohydrates, meal planning and cooking strategies, avoiding temptations and planning for success.  [name] has agreed to follow-up with our clinic in 2-2.5 weeks. She was informed of the importance of frequent follow-up visits to maximize her success with intensive lifestyle modifications for her multiple health conditions.   Objective:   Blood pressure 116/81, pulse 72, temperature 97.6 F (36.4 C), height 5\' 4"  (1.626 m), weight 208 lb (94.3 kg), last menstrual period 12/27/2003, SpO2 100 %. Body mass index is 35.7 kg/m.  General: Cooperative, alert, well developed, in no acute distress. HEENT: Conjunctivae and lids unremarkable. Cardiovascular: Regular rhythm.  Lungs: Normal work of breathing. Neurologic: No focal deficits.   Lab Results  Component Value Date   CREATININE 0.88 06/07/2020   BUN 10 06/07/2020   NA 140 06/07/2020   K 4.3 06/07/2020   CL 105 06/07/2020   CO2 29 06/07/2020   Lab Results  Component Value Date   ALT 23 06/07/2020   AST 22 06/07/2020   ALKPHOS 43 06/07/2020   BILITOT 0.3 06/07/2020   Lab Results  Component Value Date   HGBA1C 5.6 04/18/2020   HGBA1C 5.7 (H) 01/04/2020  HGBA1C 5.7 (H) 06/17/2019   HGBA1C 5.1 12/24/2017   HGBA1C 5.1 06/16/2017   Lab Results  Component Value Date   INSULIN 8.0 01/04/2020   Lab Results  Component Value Date   TSH 0.872 01/04/2020   Lab Results  Component Value Date   CHOL 187 04/18/2020   HDL 58 04/18/2020   LDLCALC 116 (H) 04/18/2020   TRIG 68 04/18/2020   CHOLHDL 3.2 04/18/2020   Lab Results  Component Value Date   WBC 4.4 06/07/2020   HGB 10.9 (L) 06/07/2020   HCT 34.9  (L) 06/07/2020   MCV 80.0 06/07/2020   PLT 294 06/07/2020   Lab Results  Component Value Date   IRON 47 06/07/2020   TIBC 344 06/07/2020   FERRITIN 59 06/07/2020   Attestation Statements:   Reviewed by clinician on day of visit: allergies, medications, problem list, medical history, surgical history, family history, social history, and previous encounter notes.  I, Insurance claims handler, CMA, am acting as Energy manager for [employer], DO.  I have reviewed the above documentation for accuracy and completeness, and I agree with the above. Carlye Grippe, D.O.  The 21st Century Cures Act was signed into law in 2016 which includes the topic of electronic health records.  This provides immediate access to information in MyChart.  This includes consultation notes, operative notes, office notes, lab results and pathology reports.  If you have any questions about what you read please let us know at your next visit so we can discuss your concerns and take corrective action if need be.  We are right here with you.

## 2020-07-18 NOTE — Progress Notes (Signed)
[facility] PREP Weekly Session   Patient Details  Name: AHLIYA GLATT MRN: [id number] Date of Birth: 03-03-1976 Age: 45 y.o. PCP: [name], FNP  There were no vitals filed for this visit.   [name] Weekly seesion - 07/18/20 1400      Weekly Session   Topic Discussed Stress management and problem solving    Minutes exercised this week 90 minutes    Classes attended to date 63            Pam Jerral Bonito 07/18/2020, 2:48 PM

## 2020-07-21 ENCOUNTER — Encounter (INDEPENDENT_AMBULATORY_CARE_PROVIDER_SITE_OTHER): Payer: Self-pay | Admitting: Family Medicine

## 2020-07-25 NOTE — Progress Notes (Signed)
[facility] PREP Weekly Session   Patient Details  Name: [name] MRN: [id number] Date of Birth: Feb 15, 1976 Age: 45 y.o. PCP: [name], FNP  There were no vitals filed for this visit.   [name] Weekly seesion - 07/25/20 1400      Weekly Session   Topic Discussed Expectations and non-scale victories    Minutes exercised this week 80 minutes    Classes attended to date 28            Estel Tonelli B Rhianon Zabawa 07/25/2020, 2:53 PM

## 2020-07-26 ENCOUNTER — Other Ambulatory Visit: Payer: Self-pay

## 2020-07-26 ENCOUNTER — Encounter (INDEPENDENT_AMBULATORY_CARE_PROVIDER_SITE_OTHER): Payer: Self-pay | Admitting: Family Medicine

## 2020-07-26 ENCOUNTER — Ambulatory Visit (INDEPENDENT_AMBULATORY_CARE_PROVIDER_SITE_OTHER): Payer: 59 | Admitting: Family Medicine

## 2020-07-26 VITALS — BP 117/77 | HR 61 | Temp 98.0°F | Ht 64.0 in | Wt 205.0 lb

## 2020-07-26 DIAGNOSIS — E559 Vitamin D deficiency, unspecified: Secondary | ICD-10-CM

## 2020-07-26 DIAGNOSIS — Z6841 Body Mass Index (BMI) 40.0 and over, adult: Secondary | ICD-10-CM

## 2020-07-26 DIAGNOSIS — G4709 Other insomnia: Secondary | ICD-10-CM

## 2020-07-26 DIAGNOSIS — Z9189 Other specified personal risk factors, not elsewhere classified: Secondary | ICD-10-CM

## 2020-07-26 MED ORDER — VITAMIN D (ERGOCALCIFEROL) 1.25 MG (50000 UNIT) PO CAPS
ORAL_CAPSULE | ORAL | 0 refills | Status: DC
Start: 2020-07-26 — End: 2020-10-25

## 2020-07-27 DIAGNOSIS — Z9189 Other specified personal risk factors, not elsewhere classified: Secondary | ICD-10-CM | POA: Insufficient documentation

## 2020-07-27 DIAGNOSIS — G4709 Other insomnia: Secondary | ICD-10-CM | POA: Insufficient documentation

## 2020-08-01 NOTE — Progress Notes (Signed)
[facility] PREP Weekly Session   Patient Details  Name: [name] MRN: [id number] Date of Birth: [birth year] Age: 45 y.o. PCP: [name], FNP  Vitals:   08/01/20 1416  Weight: 207 lb (93.9 kg)     [name] Weekly seesion - 08/01/20 1400      Weekly Session   Topic Discussed Other   Portion control   Minutes exercised this week 60 minutes    Classes attended to date 13            Pam Jerral Bonito 08/01/2020, 2:17 PM

## 2020-08-02 NOTE — Progress Notes (Signed)
Chief Complaint:   OBESITY [name] is here to discu,,ss her progress with her obesity treatment plan along with follow-up of her obesity related diagnoses.   Today's visit was #: 12 Starting weight: 239 lbs Starting date: 01/04/2020 Today's weight: 205 lbs Today's date: 07/26/2020 Total lbs lost to date: 34 lbs Body mass index is 35.19 kg/m.  Total weight loss percentage to date: -14.23%  Interim History:  Sham has been getting on the treadmill/elliptical at the gym recently.  She says that the meal plan is getting a little boring and she wants a little more freedom with choices.  Hunger and cravings are controlled.  No issues otherwise.  Current Meal Plan: the Category 2 Plan with breakfast and lunch options.  Current Exercise Plan: Walking for 30-40 minutes 3 times per week.  Assessment/Plan:   Medications Discontinued During This Encounter  Medication Reason  . Vitamin D, Ergocalciferol, (DRISDOL) 1.25 MG (50000 UNIT) CAPS capsule Reorder     Meds ordered this encounter  Medications  . Vitamin D, Ergocalciferol, (DRISDOL) 1.25 MG (50000 UNIT) CAPS capsule    Sig: 1 po q 2 wks    Dispense:  2 capsule    Refill:  0     1. Other insomnia This is well controlled.  Current treatment: Ambien 12.5 mg at bedtime, per PCP.  She feels this works well and says she feels rested in the morning.  Denies side effects.  Plan:  Continue Ambien. Recommend sleep hygiene measures including regular sleep schedule, optimal sleep environment, and relaxing presleep rituals.   2. Vitamin D deficiency At goal. Current vitamin D is 99.1, tested on 04/18/2020. Optimal goal > 50 ng/dL.  She is taking vitamin D 50,000 IU every 2 weeks.  Plan: - Discussed importance of vitamin D to their health and well-being.  - possible symptoms of low Vitamin D can be low energy, depressed mood, muscle aches, joint aches, osteoporosis etc. - low Vitamin D levels may be linked to an increased risk of  cardiovascular events and even increased risk of cancers- such as colon and breast.  - I recommend pt take a prescription vit D every 2 weeks - see script below   - Informed patient this may be a lifelong thing, and she was encouraged to continue to take the medicine until told otherwise.   - we will need to monitor levels regularly (every 3-4 mo on average) to keep levels within normal limits.  - weight loss will likely improve availability of vitamin D, thus encouraged Bert to continue with meal plan and their weight loss efforts to further improve this condition - pt's questions and concerns regarding this condition addressed.  - Refill Vitamin D, Ergocalciferol, (DRISDOL) 1.25 MG (50000 UNIT) CAPS capsule; 1 po q 2 wks  Dispense: 2 capsule; Refill: 0  3. At risk for side effect of medication Due to Jessamy's current conditions and medications, she is at a higher risk for drug side effect.  At least 8 minutes was spent on counseling her about these concerns today.  We discussed the benefits and potential risks of these medications, and all of patient's concerns were addressed and questions were answered.  she will call us, or their PCP or other specialists who treat their conditions with medications, with any questions or concerns that may develop.    4. Obesity, current BMI 35.2  Course: Marshelle is currently in the action stage of change. As such, her goal is to continue with weight  loss efforts.   Nutrition goals: She has agreed to keeping a food journal and adhering to recommended goals of 1600-1700 calories and 100+ grams of protein.   Exercise goals: As is.  Behavioral modification strategies: meal planning and cooking strategies, better snacking choices and avoiding temptations.  Kallee has agreed to follow-up with our clinic in 2-3 weeks. She was informed of the importance of frequent follow-up visits to maximize her success with intensive lifestyle modifications for her multiple  health conditions.   Objective:   Blood pressure 117/77, pulse 61, temperature 98 F (36.7 C), height 5\' 4"  (1.626 m), weight 205 lb (93 kg), last menstrual period 12/27/2003, SpO2 99 %. Body mass index is 35.19 kg/m.  General: Cooperative, alert, well developed, in no acute distress. HEENT: Conjunctivae and lids unremarkable. Cardiovascular: Regular rhythm.  Lungs: Normal work of breathing. Neurologic: No focal deficits.   Lab Results  Component Value Date   CREATININE 0.88 06/07/2020   BUN 10 06/07/2020   NA 140 06/07/2020   K 4.3 06/07/2020   CL 105 06/07/2020   CO2 29 06/07/2020   Lab Results  Component Value Date   ALT 23 06/07/2020   AST 22 06/07/2020   ALKPHOS 43 06/07/2020   BILITOT 0.3 06/07/2020   Lab Results  Component Value Date   HGBA1C 5.6 04/18/2020   HGBA1C 5.7 (H) 01/04/2020   HGBA1C 5.7 (H) 06/17/2019   HGBA1C 5.1 12/24/2017   HGBA1C 5.1 06/16/2017   Lab Results  Component Value Date   INSULIN 8.0 01/04/2020   Lab Results  Component Value Date   TSH 0.872 01/04/2020   Lab Results  Component Value Date   CHOL 187 04/18/2020   HDL 58 04/18/2020   LDLCALC 116 (H) 04/18/2020   TRIG 68 04/18/2020   CHOLHDL 3.2 04/18/2020   Lab Results  Component Value Date   WBC 4.4 06/07/2020   HGB 10.9 (L) 06/07/2020   HCT 34.9 (L) 06/07/2020   MCV 80.0 06/07/2020   PLT 294 06/07/2020   Lab Results  Component Value Date   IRON 47 06/07/2020   TIBC 344 06/07/2020   FERRITIN 59 06/07/2020   Attestation Statements:   Reviewed by clinician on day of visit: allergies, medications, problem list, medical history, surgical history, family history, social history, and previous encounter notes.  I, 08/05/2020, CMA, am acting as Insurance claims handler for Energy manager, DO.  I have reviewed the above documentation for accuracy and completeness, and I agree with the above. [employer], D.O.  The 21st Century Cures Act was signed into law in 2016  which includes the topic of electronic health records.  This provides immediate access to information in MyChart.  This includes consultation notes, operative notes, office notes, lab results and pathology reports.  If you have any questions about what you read please let 2017 know at your next visit so we can discuss your concerns and take corrective action if need be.  We are right here with you.

## 2020-08-10 DIAGNOSIS — R490 Dysphonia: Secondary | ICD-10-CM | POA: Insufficient documentation

## 2020-08-11 ENCOUNTER — Encounter (INDEPENDENT_AMBULATORY_CARE_PROVIDER_SITE_OTHER): Payer: Self-pay | Admitting: Family Medicine

## 2020-08-14 ENCOUNTER — Ambulatory Visit (INDEPENDENT_AMBULATORY_CARE_PROVIDER_SITE_OTHER): Payer: 59 | Admitting: Family Medicine

## 2020-08-15 NOTE — Progress Notes (Signed)
[facility] PREP Weekly Session   Patient Details  Name: [name] MRN: [id number] Date of Birth: [birth year] Age: 45 y.o. PCP: [name], FNP  There were no vitals filed for this visit.   [name] Weekly seesion - 08/15/20 1500      Weekly Session   Topic Discussed Calorie breakdown    Minutes exercised this week --   not charted   Classes attended to date 5            Pam Jerral Bonito 08/15/2020, 3:08 PM

## 2020-08-17 ENCOUNTER — Ambulatory Visit: Payer: 59 | Admitting: Neurology

## 2020-08-22 ENCOUNTER — Ambulatory Visit (INDEPENDENT_AMBULATORY_CARE_PROVIDER_SITE_OTHER): Payer: 59 | Admitting: Orthopaedic Surgery

## 2020-08-22 DIAGNOSIS — G5601 Carpal tunnel syndrome, right upper limb: Secondary | ICD-10-CM

## 2020-08-22 NOTE — Progress Notes (Signed)
[facility] PREP Weekly Session   Patient Details  Name: [name] MRN: [id number] Date of Birth: 09-07-1975 Age: 45 y.o. PCP: [name], FNP  There were no vitals filed for this visit.   [name] Weekly seesion - 08/22/20 1400      Weekly Session   Topic Discussed Hitting roadblocks   goals and activity plan for next 90 days.   Minutes exercised this week 60 minutes    Classes attended to date 25            Ashli Selders B Estrellita Lasky 08/22/2020, 2:49 PM

## 2020-08-22 NOTE — Addendum Note (Signed)
Addended byPrescott Parma on: 08/22/2020 11:19 AM   Modules accepted: Orders

## 2020-08-22 NOTE — Progress Notes (Signed)
Office Visit Note   Patient: [name]           Date of Birth: [birth year]           MRN: [id number] Visit Date: 08/22/2020              Requested by: [name], FNP [address] [place],  [place] [address] PCP: [name], FNP   Assessment & Plan: Visit Diagnoses:  1. Right carpal tunnel syndrome     Plan: Suspect that she may have carpal tunnel syndrome based on findings today.  We will obtain EMGs for this.  We will see her back afterwards.    Follow-Up Instructions: Return if symptoms worsen or fail to improve.   Orders:  No orders of the defined types were placed in this encounter.  No orders of the defined types were placed in this encounter.     Procedures: No procedures performed   Clinical Data: No additional findings.   Subjective: Chief Complaint  Patient presents with  . Right Shoulder - Pain  . Neck - Pain    [name] comes in today for right shoulder and arm and hand numbness and tingling.  She has trouble with opening bottles due to decreased strength.  She denies any injuries.  Does not wake up with pain.  Denies any nighttime pain.   Review of Systems  Constitutional: Negative.   HENT: Negative.   Eyes: Negative.   Respiratory: Negative.   Cardiovascular: Negative.   Endocrine: Negative.   Musculoskeletal: Negative.   Neurological: Negative.   Hematological: Negative.   Psychiatric/Behavioral: Negative.   All other systems reviewed and are negative.    Objective: Vital Signs: LMP 12/27/2003   Physical Exam Vitals and nursing note reviewed.  Constitutional:      Appearance: She is well-developed.  HENT:     Head: Normocephalic and atraumatic.  Pulmonary:     Effort: Pulmonary effort is normal.  Abdominal:     Palpations: Abdomen is soft.  Musculoskeletal:     Cervical back: Neck supple.  Skin:    General: Skin is warm.     Capillary Refill: Capillary refill takes less than 2 seconds.  Neurological:      Mental Status: She is alert and oriented to person, place, and time.  Psychiatric:        Behavior: Behavior normal.        Thought Content: Thought content normal.        Judgment: Judgment normal.     Ortho Exam Right hand shows a positive carpal tunnel compressive signs.  No muscle atrophy normal arc of motion of the fingers. Specialty Comments:  No specialty comments available.  Imaging: No results found.   PMFS History: Patient Active Problem List   Diagnosis Date Noted  . Other insomnia 07/27/2020  . At risk for side effect of medication 07/27/2020  . Other hyperlipidemia 05/15/2020  . At risk for osteoporosis 05/15/2020  . At risk for impaired metabolic function 04/04/2020  . Other fatigue 03/22/2020  . SOBOE (shortness of breath on exertion) 03/22/2020  . Exertional dyspnea 03/14/2020  . Neuropathy, right side of face 03/08/2020  . At risk for depression 03/08/2020  . Primary osteoarthritis of both feet 02/26/2020  . Vitamin D deficiency, unspecified 01/26/2020  . Hyperlipidemia 01/26/2020  . Prediabetes 01/26/2020  . At risk for heart disease 01/26/2020  . Daytime somnolence 01/26/2020  . Vitamin D deficiency 01/04/2020  . Fatty liver 01/04/2020  .  B12 deficiency 01/04/2020  . Moderate persistent asthma without complication 12/31/2019  . Inappropriate sinus tachycardia 12/08/2019  . Class 3 severe obesity with serious comorbidity and body mass index (BMI) of 40.0 to 44.9 in adult (HCC) 12/08/2019  . Apnea 12/08/2019  . Anaphylactic reaction due to food, subsequent encounter 11/22/2019  . Allergic rhinoconjunctivitis 11/22/2019  . Idiopathic urticaria 11/22/2019  . Generalized anxiety disorder 04/08/2018  . Atypical chest pain   . Regurgitation of food   . Laryngopharyngeal reflux (LPR) 07/30/2017  . Throat soreness 07/30/2017  . ANA positive 12/30/2016  . Gastroesophageal reflux disease 06/27/2016  . Disturbance of skin sensation 04/27/2014  .  Dizziness and giddiness 03/14/2014  . Headache, migraine 03/14/2014  . Myalgia and myositis, unspecified 06/07/2013  . Nausea and vomiting in adult 08/22/2012  . Viral syndrome 08/22/2012  . Pelvic pain in female 12/18/2010  . PID (acute pelvic inflammatory disease) 12/18/2010  . ANEMIA-IRON DEFICIENCY 10/15/2006  . HERPES, GENITAL NOS 09/30/2006  . Obesity (BMI 35.0-39.9 without comorbidity) 09/30/2006  . DISORDER, NONORGANIC SLEEP NOS 09/30/2006  . CONSTIPATION NOS 09/30/2006  . NEURITIS, LUMBOSACRAL NOS 09/30/2006  . HX, PERSONAL, MENTAL DISORDER NOS 09/30/2006   Past Medical History:  Diagnosis Date  . Allergy   . Anemia   . Angio-edema   . Anxiety   . Apnea 12/08/2019  . Arthritis   . Asthma   . Back pain   . Constipation   . Exertional dyspnea 03/14/2020  . Fatty liver   . GERD (gastroesophageal reflux disease)   . H/O: hysterectomy   . Hx of endometriosis   . Inappropriate sinus tachycardia 12/08/2019  . Insomnia   . Joint pain   . Lactose intolerance   . Macromastia 07/2011  . Migraine   . Morbid obesity (HCC) 12/08/2019  . Osteoporosis   . Urticaria   . Vitamin D deficiency     Family History  Problem Relation Age of Onset  . Diabetes Mother   . Hypertension Mother   . Hyperlipidemia Mother   . Sleep apnea Mother   . Thyroid disease Sister   . Dementia Father   . Heart attack Maternal Grandmother   . Stroke Maternal Grandfather   . Asthma Son   . Healthy Son   . Asthma Daughter   . Healthy Daughter   . Colon cancer Neg Hx   . Colon polyps Neg Hx   . Esophageal cancer Neg Hx   . Rectal cancer Neg Hx   . Stomach cancer Neg Hx     Past Surgical History:  Procedure Laterality Date  . 24 HOUR PH STUDY N/A 01/12/2018   Procedure: 24 HOUR PH STUDY;  Surgeon: Napoleon Form, MD;  Location: [facility];  Service: Endoscopy;  Laterality: N/A;  . ABDOMINAL HYSTERECTOMY  12/12/2003  . ADENOIDECTOMY  05/2011  . ANKLE ARTHROSCOPY  12/29/2007   right; with  extensive debridement  . BLADDER NECK RECONSTRUCTION  01/14/2011   Procedure: BLADDER NECK REPAIR;  Surgeon: Reva Bores, MD;  Location: [facility];  Service: Gynecology;  Laterality: N/A;  Laparoscopic Repair of Incidental Cystotomy  . BREAST REDUCTION SURGERY  08/05/2011   Procedure: MAMMARY REDUCTION  (BREAST);  Surgeon: Louisa Second, MD;  Location: [facility];  Service: Plastics;  Laterality: Bilateral;  bilateral  . BREAST SURGERY  2012   breast reduction  . CESAREAN SECTION  12/29/2000; 1994  . DILATION AND CURETTAGE OF UTERUS  07/08/2003   open laparoscopy  . ESOPHAGEAL  MANOMETRY N/A 01/12/2018   Procedure: ESOPHAGEAL MANOMETRY (EM);  Surgeon: Napoleon Form, MD;  Location: [facility];  Service: Endoscopy;  Laterality: N/A;  . GIVENS CAPSULE STUDY N/A 06/23/2017   Procedure: GIVENS CAPSULE STUDY;  Surgeon: Benancio Deeds, MD;  Location: [facility];  Service: Gastroenterology;  Laterality: N/A;  . LAPAROSCOPIC SALPINGOOPHERECTOMY  01/14/2011   left  . PH IMPEDANCE STUDY N/A 01/12/2018   Procedure: PH IMPEDANCE STUDY;  Surgeon: Napoleon Form, MD;  Location: [facility];  Service: Endoscopy;  Laterality: N/A;  . REPAIR PERONEAL TENDONS ANKLE  01/03/2010   repair right subluxing peroneal tendons  . RESECTION DISTAL CLAVICAL Right 09/25/2017   Procedure: RESECTION DISTAL CLAVICAL;  Surgeon: Bjorn Pippin, MD;  Location: [facility];  Service: Orthopedics;  Laterality: Right;  . RIGHT OOPHORECTOMY     with lysis of adhesions  . SHOULDER ACROMIOPLASTY Right 09/25/2017   Procedure: SHOULDER ACROMIOPLASTY;  Surgeon: Bjorn Pippin, MD;  Location: [facility];  Service: Orthopedics;  Laterality: Right;  . SHOULDER ARTHROSCOPY WITH DEBRIDEMENT AND BICEP TENDON REPAIR Right 09/25/2017   Procedure: SHOULDER ARTHROSCOPY WITH DEBRIDEMENT AND BICEP TENDON REPAIR;  Surgeon: Bjorn Pippin, MD;  Location: [facility];  Service:  Orthopedics;  Laterality: Right;  . SHOULDER ARTHROSCOPY WITH ROTATOR CUFF REPAIR Right 09/25/2017   Procedure: SHOULDER ARTHROSCOPY WITH ROTATOR CUFF REPAIR;  Surgeon: Bjorn Pippin, MD;  Location:  SURGERY CENTER;  Service: Orthopedics;  Laterality: Right;  . TONSILLECTOMY  as a child  . TUBAL LIGATION  12/29/2000  . UPPER GASTROINTESTINAL ENDOSCOPY     Social History   Occupational History  . Occupation: Facilities manager: OTHER    Comment: [employer]  Tobacco Use  . Smoking status: Never Smoker  . Smokeless tobacco: Never Used  Vaping Use  . Vaping Use: Never used  Substance and Sexual Activity  . Alcohol use: Yes    Comment: occasional   . Drug use: No  . Sexual activity: Not on file

## 2020-08-29 NOTE — Progress Notes (Signed)
[facility] PREP Weekly Session   Patient Details  Name: [name] MRN: [id number] Date of Birth: 21-Oct-1975 Age: 45 y.o. PCP: [name], FNP  There were no vitals filed for this visit.   [name] Weekly seesion - 08/29/20 1400      Weekly Session   Topic Discussed --   final fit test and surveys   Minutes exercised this week 120 minutes    Classes attended to date 62            Pam Jerral Bonito 08/29/2020, 2:33 PM

## 2020-08-31 NOTE — Progress Notes (Signed)
[facility] PREP Progress Report   Patient Details  Name: [name] MRN: [id number] Date of Birth: May 09, 1975 Age: 45 y.o. PCP: [name], FNP  Vitals:   08/31/20 1459  BP: 122/78  Pulse: 75  SpO2: 99%  Weight: 208 lb 6.4 oz (94.5 kg)  Height: 5\' 4"  (1.626 m)      [name] Eval - 08/31/20 1500      Referral    Program Start Date --   PREP ended 08/31/20     Measurement   Waist Circumference 43 inches    Hip Circumference 48.5 inches    Body fat 42.7 percent      Information for Trainer   Goals --   Goal weight 180 lbs by November 06, 2020     Mobility and Daily Activities   I find it easy to walk up or down two or more flights of stairs. 3    I have no trouble taking out the trash. 3    I do housework such as vacuuming and dusting on my own without difficulty. 3    I can easily lift a gallon of milk (8lbs). 4    I can easily walk a mile. 3    I have no trouble reaching into high cupboards or reaching down to pick up something from the floor. 2    I do not have trouble doing out-door work such as November 08, 2020, raking leaves, or gardening. 2      Mobility and Daily Activities   I feel younger than my age. 2    I feel independent. 4    I feel energetic. 2    I live an active life.  3    I feel strong. 3    I feel healthy. 3    I feel active as other people my age. 3      How fit and strong are you.   Fit and Strong Total Score 40          Past Medical History:  Diagnosis Date  . Allergy   . Anemia   . Angio-edema   . Anxiety   . Apnea 12/08/2019  . Arthritis   . Asthma   . Back pain   . Constipation   . Exertional dyspnea 03/14/2020  . Fatty liver   . GERD (gastroesophageal reflux disease)   . H/O: hysterectomy   . Hx of endometriosis   . Inappropriate sinus tachycardia 12/08/2019  . Insomnia   . Joint pain   . Lactose intolerance   . Macromastia 07/2011  . Migraine   . Morbid obesity (HCC) 12/08/2019  . Osteoporosis   . Urticaria   .  Vitamin D deficiency    Past Surgical History:  Procedure Laterality Date  . 24 HOUR PH STUDY N/A 01/12/2018   Procedure: 24 HOUR PH STUDY;  Surgeon: 03/14/2018, MD;  [facility]: [facility];  Service: Endoscopy;  Laterality: N/A;  . ABDOMINAL HYSTERECTOMY  12/12/2003  . ADENOIDECTOMY  05/2011  . ANKLE ARTHROSCOPY  12/29/2007   right; with extensive debridement  . BLADDER NECK RECONSTRUCTION  01/14/2011   Procedure: BLADDER NECK REPAIR;  Surgeon: 03/16/2011, MD;  Location: [facility];  Service: Gynecology;  Laterality: N/A;  Laparoscopic Repair of Incidental Cystotomy  . BREAST REDUCTION SURGERY  08/05/2011   Procedure: MAMMARY REDUCTION  (BREAST);  Surgeon: 10/05/2011, MD;  Location: [facility];  Service: Plastics;  Laterality: Bilateral;  bilateral  .  BREAST SURGERY  2012   breast reduction  . CESAREAN SECTION  12/29/2000; 1994  . DILATION AND CURETTAGE OF UTERUS  07/08/2003   open laparoscopy  . ESOPHAGEAL MANOMETRY N/A 01/12/2018   Procedure: ESOPHAGEAL MANOMETRY (EM);  Surgeon: Napoleon Form, MD;  [facility]: [facility];  Service: Endoscopy;  Laterality: N/A;  . GIVENS CAPSULE STUDY N/A 06/23/2017   Procedure: GIVENS CAPSULE STUDY;  Surgeon: Benancio Deeds, MD;  Location[facility];  Service: Gastroenterology;  Laterality: N/A;  . LAPAROSCOPIC SALPINGOOPHERECTOMY  01/14/2011   left  . PH IMPEDANCE STUDY N/A 01/12/2018   Procedure: PH IMPEDANCE STUDY;  Surgeon: Napoleon Form, MD;  [facility]: [facility];  Service: Endoscopy;  Laterality: N/A;  . REPAIR PERONEAL TENDONS ANKLE  01/03/2010   repair right subluxing peroneal tendons  . RESECTION DISTAL CLAVICAL Right 09/25/2017   Procedure: RESECTION DISTAL CLAVICAL;  Surgeon: Bjorn Pippin, MD;  Location: [facility];  Service: Orthopedics;  Laterality: Right;  . RIGHT OOPHORECTOMY     with lysis of adhesions  . SHOULDER ACROMIOPLASTY Right 09/25/2017   Procedure: SHOULDER ACROMIOPLASTY;   Surgeon: Bjorn Pippin, MD;  Location: [facility];  Service: Orthopedics;  Laterality: Right;  . SHOULDER ARTHROSCOPY WITH DEBRIDEMENT AND BICEP TENDON REPAIR Right 09/25/2017   Procedure: SHOULDER ARTHROSCOPY WITH DEBRIDEMENT AND BICEP TENDON REPAIR;  Surgeon: Bjorn Pippin, MD;  Location: [facility];  Service: Orthopedics;  Laterality: Right;  . SHOULDER ARTHROSCOPY WITH ROTATOR CUFF REPAIR Right 09/25/2017   Procedure: SHOULDER ARTHROSCOPY WITH ROTATOR CUFF REPAIR;  Surgeon: Bjorn Pippin, MD;  Location:  SURGERY CENTER;  Service: Orthopedics;  Laterality: Right;  . TONSILLECTOMY  as a child  . TUBAL LIGATION  12/29/2000  . UPPER GASTROINTESTINAL ENDOSCOPY     Social History   Tobacco Use  Smoking Status Never Smoker  Smokeless Tobacco Never Used     Bonnye Fava 08/31/2020, 3:02 PM

## 2020-10-04 ENCOUNTER — Other Ambulatory Visit: Payer: Self-pay

## 2020-10-04 ENCOUNTER — Encounter (HOSPITAL_COMMUNITY): Payer: Self-pay

## 2020-10-04 ENCOUNTER — Ambulatory Visit (HOSPITAL_COMMUNITY)
Admission: EM | Admit: 2020-10-04 | Discharge: 2020-10-04 | Disposition: A | Discharge status: Home / Self Care | Payer: 59 | Source: Home / Self Care

## 2020-10-04 DIAGNOSIS — M545 Low back pain, unspecified: Secondary | ICD-10-CM | POA: Insufficient documentation

## 2020-10-04 DIAGNOSIS — R188 Other ascites: Secondary | ICD-10-CM | POA: Diagnosis not present

## 2020-10-04 DIAGNOSIS — R300 Dysuria: Secondary | ICD-10-CM

## 2020-10-04 DIAGNOSIS — M549 Dorsalgia, unspecified: Secondary | ICD-10-CM | POA: Diagnosis not present

## 2020-10-04 DIAGNOSIS — R1084 Generalized abdominal pain: Secondary | ICD-10-CM | POA: Diagnosis not present

## 2020-10-04 LAB — POC URINE PREG, ED: Preg Test, Ur: NEGATIVE

## 2020-10-04 LAB — POCT URINALYSIS DIPSTICK, ED / UC
Bilirubin Urine: NEGATIVE
Glucose, UA: NEGATIVE mg/dL
Ketones, ur: NEGATIVE mg/dL
Leukocytes,Ua: NEGATIVE
Nitrite: NEGATIVE
Protein, ur: NEGATIVE mg/dL
Specific Gravity, Urine: <=1.005 (ref 1.005–1.030)
Urobilinogen, UA: 0.2 mg/dL (ref 0.0–1.0)
pH: 5.5 (ref 5.0–8.0)

## 2020-10-04 MED ORDER — NAPROXEN 500 MG PO TABS: 500.0000 mg | ORAL_TABLET | Freq: Two times a day (BID) | ORAL | 0 refills | Status: DC

## 2020-10-04 MED ORDER — TIZANIDINE HCL 4 MG PO TABS: 4.0000 mg | ORAL_TABLET | Freq: Four times a day (QID) | ORAL | 0 refills | Status: DC | PRN

## 2020-10-04 NOTE — ED Provider Notes (Signed)
[facility]    CSN: [id number] Arrival date & time: 10/04/20  1207      History   Chief Complaint Chief Complaint  Patient presents with  . Abdominal Pain  . Back Pain    HPI [name] is a 45 y.o. female.   Patient here for evaluation of back pain and abdominal pain that has been ongoing for the past several days.  Reports having dysuria, urgency, and frequency.  Reports taking OTC medications with minimal relief.  Pain has been worsening and now states that it hurts to walk.  Denies any trauma, injury, or other precipitating event. Denies any fevers, chest pain, shortness of breath, N/V/D, numbness, tingling, weakness, or headaches.    The history is provided by the patient.  Abdominal Pain Associated symptoms: dysuria   Back Pain Associated symptoms: abdominal pain and dysuria     Past Medical History:  Diagnosis Date  . Allergy   . Anemia   . Angio-edema   . Anxiety   . Apnea 12/08/2019  . Arthritis   . Asthma   . Back pain   . Constipation   . Exertional dyspnea 03/14/2020  . Fatty liver   . GERD (gastroesophageal reflux disease)   . H/O: hysterectomy   . Hx of endometriosis   . Inappropriate sinus tachycardia 12/08/2019  . Insomnia   . Joint pain   . Lactose intolerance   . Macromastia 07/2011  . Migraine   . Morbid obesity (HCC) 12/08/2019  . Osteoporosis   . Urticaria   . Vitamin D deficiency     Patient Active Problem List   Diagnosis Date Noted  . Other insomnia 07/27/2020  . At risk for side effect of medication 07/27/2020  . Other hyperlipidemia 05/15/2020  . At risk for osteoporosis 05/15/2020  . At risk for impaired metabolic function 04/04/2020  . Other fatigue 03/22/2020  . SOBOE (shortness of breath on exertion) 03/22/2020  . Exertional dyspnea 03/14/2020  . Neuropathy, right side of face 03/08/2020  . At risk for depression 03/08/2020  . Primary osteoarthritis of both feet 02/26/2020  . Vitamin D deficiency, unspecified  01/26/2020  . Hyperlipidemia 01/26/2020  . Prediabetes 01/26/2020  . At risk for heart disease 01/26/2020  . Daytime somnolence 01/26/2020  . Vitamin D deficiency 01/04/2020  . Fatty liver 01/04/2020  . B12 deficiency 01/04/2020  . Moderate persistent asthma without complication 12/31/2019  . Inappropriate sinus tachycardia 12/08/2019  . Class 3 severe obesity with serious comorbidity and body mass index (BMI) of 40.0 to 44.9 in adult (HCC) 12/08/2019  . Apnea 12/08/2019  . Anaphylactic reaction due to food, subsequent encounter 11/22/2019  . Allergic rhinoconjunctivitis 11/22/2019  . Idiopathic urticaria 11/22/2019  . Generalized anxiety disorder 04/08/2018  . Atypical chest pain   . Regurgitation of food   . Laryngopharyngeal reflux (LPR) 07/30/2017  . Throat soreness 07/30/2017  . ANA positive 12/30/2016  . Gastroesophageal reflux disease 06/27/2016  . Disturbance of skin sensation 04/27/2014  . Dizziness and giddiness 03/14/2014  . Headache, migraine 03/14/2014  . Myalgia and myositis, unspecified 06/07/2013  . Nausea and vomiting in adult 08/22/2012  . Viral syndrome 08/22/2012  . Pelvic pain in female 12/18/2010  . PID (acute pelvic inflammatory disease) 12/18/2010  . ANEMIA-IRON DEFICIENCY 10/15/2006  . HERPES, GENITAL NOS 09/30/2006  . Obesity (BMI 35.0-39.9 without comorbidity) 09/30/2006  . DISORDER, NONORGANIC SLEEP NOS 09/30/2006  . CONSTIPATION NOS 09/30/2006  . NEURITIS, LUMBOSACRAL NOS 09/30/2006  . HX,  PERSONAL, MENTAL DISORDER NOS 09/30/2006    Past Surgical History:  Procedure Laterality Date  . 24 HOUR PH STUDY N/A 01/12/2018   Procedure: 24 HOUR PH STUDY;  Surgeon: Napoleon FormNandigam, Kavitha V, MD;  Location: [facility];  Service: Endoscopy;  Laterality: N/A;  . ABDOMINAL HYSTERECTOMY  12/12/2003  . ADENOIDECTOMY  05/2011  . ANKLE ARTHROSCOPY  12/29/2007   right; with extensive debridement  . BLADDER NECK RECONSTRUCTION  01/14/2011   Procedure: BLADDER NECK  REPAIR;  Surgeon: Reva Boresanya S Pratt, MD;  Location: [facility];  Service: Gynecology;  Laterality: N/A;  Laparoscopic Repair of Incidental Cystotomy  . BREAST REDUCTION SURGERY  08/05/2011   Procedure: MAMMARY REDUCTION  (BREAST);  Surgeon: Louisa SecondGerald Truesdale, MD;  Location: [facility];  Service: Plastics;  Laterality: Bilateral;  bilateral  . BREAST SURGERY  2012   breast reduction  . CESAREAN SECTION  12/29/2000; 1994  . DILATION AND CURETTAGE OF UTERUS  07/08/2003   open laparoscopy  . ESOPHAGEAL MANOMETRY N/A 01/12/2018   Procedure: ESOPHAGEAL MANOMETRY (EM);  Surgeon: Napoleon FormNandigam, Kavitha V, MD;  Location: [facility];  Service: Endoscopy;  Laterality: N/A;  . GIVENS CAPSULE STUDY N/A 06/23/2017   Procedure: GIVENS CAPSULE STUDY;  Surgeon: Benancio DeedsArmbruster, Steven P, MD;  [facility];  Service: Gastroenterology;  Laterality: N/A;  . LAPAROSCOPIC SALPINGOOPHERECTOMY  01/14/2011   left  . PH IMPEDANCE STUDY N/A 01/12/2018   Procedure: PH IMPEDANCE STUDY;  Surgeon: Napoleon FormNandigam, Kavitha V, MD;  Location: [facility];  Service: Endoscopy;  Laterality: N/A;  . REPAIR PERONEAL TENDONS ANKLE  01/03/2010   repair right subluxing peroneal tendons  . RESECTION DISTAL CLAVICAL Right 09/25/2017   Procedure: RESECTION DISTAL CLAVICAL;  Surgeon: Bjorn PippinVarkey, Dax T, MD;  Location: [facility];  Service: Orthopedics;  Laterality: Right;  . RIGHT OOPHORECTOMY     with lysis of adhesions  . SHOULDER ACROMIOPLASTY Right 09/25/2017   Procedure: SHOULDER ACROMIOPLASTY;  Surgeon: Bjorn PippinVarkey, Dax T, MD;  Location: [facility];  Service: Orthopedics;  Laterality: Right;  . SHOULDER ARTHROSCOPY WITH DEBRIDEMENT AND BICEP TENDON REPAIR Right 09/25/2017   Procedure: SHOULDER ARTHROSCOPY WITH DEBRIDEMENT AND BICEP TENDON REPAIR;  Surgeon: Bjorn PippinVarkey, Dax T, MD;  Location: [facility];  Service: Orthopedics;  Laterality: Right;  . SHOULDER ARTHROSCOPY WITH ROTATOR CUFF REPAIR Right 09/25/2017    Procedure: SHOULDER ARTHROSCOPY WITH ROTATOR CUFF REPAIR;  Surgeon: Bjorn PippinVarkey, Dax T, MD;  Location:  SUR[facility]ERY [facility];  Service: Orthopedics;  Laterality: Right;  . TONSILLECTOMY  as a child  . TUBAL LIGATION  12/29/2000  . UPPER GASTROINTESTINAL ENDOSCOPY      OB History    Gravida  2   Para  2   Term  2   Preterm      AB      Living  2     SAB      IAB      Ectopic      Multiple      Live Births               Home Medications    Prior to Admission medications   Medication Sig Start Date End Date Taking? Authorizing Provider  DULoxetine (CYMBALTA) 20 MG capsule duloxetine 20 mg capsule,delayed release 08/08/20  Yes [provider]  naproxen (NAPROSYN) 500 MG tablet Take 1 tablet (500 mg total) by mouth 2 (two) times daily. 10/04/20  Yes Ivette LoyalSmith, Elverta Dimiceli R, NP  tiZANidine (ZANAFLEX) 4 MG tablet Take  1 tablet (4 mg total) by mouth every 6 (six) hours as needed for muscle spasms. 10/04/20  Yes Ivette Loyal, NP  albuterol (VENTOLIN HFA) 108 (90 Base) MCG/ACT inhaler Inhale 2 puffs into the lungs every 6 (six) hours as needed for wheezing or shortness of breath. 01/25/20   Charlott Holler, MD  amitriptyline (ELAVIL) 10 MG tablet amitriptyline 10 mg tablet  TAKE 1 TABLET BY MOUTH EVERY NIGHT    [provider]  calcium carbonate (OS-CAL - DOSED IN MG OF ELEMENTAL CALCIUM) 1250 (500 Ca) MG tablet 1 tablet with meals    [provider]  cetirizine (ZYRTEC) 10 MG tablet TAKE 1 TABLET (10 MG TOTAL) BY MOUTH DAILY AS NEEDED FOR RHINITIS. 05/16/20   Ambs, Norvel Richards, FNP  EPINEPHrine (EPIPEN 2-PAK) 0.3 mg/0.3 mL IJ SOAJ injection Inject 0.3 mg into the muscle as needed for anaphylaxis. 07/07/20   Hetty Blend, FNP  esomeprazole (NEXIUM) 40 MG capsule TAKE 1 CAPSULE (40 MG TOTAL) BY MOUTH DAILY AT 12 NOON. 03/24/20   Armbruster, Willaim Rayas, MD  famotidine (PEPCID) 20 MG tablet Take 20 mg by mouth daily. 01/22/20   [provider]  Ferrous Sulfate (IRON  PO) Take by mouth daily.    [provider]  fluticasone furoate-vilanterol (BREO ELLIPTA) 200-25 MCG/INH AEPB Inhale 1 puff into the lungs daily. 03/10/20   Glenford Bayley, NP  ondansetron (ZOFRAN) 4 MG tablet Take 1 tablet (4 mg total) by mouth daily as needed for nausea or vomiting. 04/19/20 04/19/21  [name], FNP  RESTASIS 0.05 % ophthalmic emulsion 1 drop 2 (two) times daily. 11/04/19   [provider]  sucralfate (CARAFATE) 1 g tablet TAKE 1 TABLET (1 G TOTAL) BY MOUTH EVERY 6 (SIX) HOURS. SLOWLY DISSOLVE TABLET IN 1 TABLESPOON OF DISTILLED WATER BEFORE INGESTION 04/21/20   Armbruster, Willaim Rayas, MD  Vitamin D, Ergocalciferol, (DRISDOL) 1.25 MG (50000 UNIT) CAPS capsule 1 po q 2 wks 07/26/20   Opalski, Gavin Pound, DO  zolpidem (AMBIEN CR) 12.5 MG CR tablet Take 12.5 mg by mouth at bedtime as needed. 09/13/19   [provider]    Family History Family History  Problem Relation Age of Onset  . Diabetes Mother   . Hypertension Mother   . Hyperlipidemia Mother   . Sleep apnea Mother   . Thyroid disease Sister   . Dementia Father   . Heart attack Maternal Grandmother   . Stroke Maternal Grandfather   . Asthma Son   . Healthy Son   . Asthma Daughter   . Healthy Daughter   . Colon cancer Neg Hx   . Colon polyps Neg Hx   . Esophageal cancer Neg Hx   . Rectal cancer Neg Hx   . Stomach cancer Neg Hx     Social History Social History   Tobacco Use  . Smoking status: Never Smoker  . Smokeless tobacco: Never Used  Vaping Use  . Vaping Use: Never used  Substance Use Topics  . Alcohol use: Yes    Comment: occasional   . Drug use: No     Allergies   Aspirin, Ibuprofen, Salmon [fish allergy], and Zoledronic acid   Review of Systems Review of Systems  Gastrointestinal: Positive for abdominal pain.  Genitourinary: Positive for dysuria, frequency and urgency.  Musculoskeletal: Positive for back pain.  All other systems reviewed and are  negative.    Physical Exam Triage Vital Signs ED Triage Vitals  Enc Vitals Group  BP 10/04/20 1330 135/89     Pulse Rate 10/04/20 1330 70     Resp 10/04/20 1330 19     Temp 10/04/20 1330 98.8 F (37.1 C)     Temp Source 10/04/20 1330 Oral     SpO2 10/04/20 1330 100 %     Weight --      Height --      Head Circumference --      Peak Flow --      Pain Score 10/04/20 1328 9     Pain Loc --      Pain Edu? --      Excl. in GC? --    No data found.  Updated Vital Signs BP 135/89 (BP Location: Right Arm)   Pulse 70   Temp 98.8 F (37.1 C) (Oral)   Resp 19   LMP 12/27/2003   SpO2 100%   Visual Acuity Right Eye Distance:   Left Eye Distance:   Bilateral Distance:    Right Eye Near:   Left Eye Near:    Bilateral Near:     Physical Exam Vitals and nursing note reviewed.  Constitutional:      General: She is not in acute distress.    Appearance: Normal appearance. She is not ill-appearing, toxic-appearing or diaphoretic.  HENT:     Head: Normocephalic and atraumatic.  Eyes:     Conjunctiva/sclera: Conjunctivae normal.  Cardiovascular:     Rate and Rhythm: Normal rate.     Pulses: Normal pulses.  Pulmonary:     Effort: Pulmonary effort is normal.  Abdominal:     General: Abdomen is flat.     Tenderness: There is no abdominal tenderness. There is no right CVA tenderness or left CVA tenderness.     Hernia: No hernia is present.  Genitourinary:    Comments: declines Musculoskeletal:        General: Normal range of motion.     Cervical back: Normal range of motion.  Skin:    General: Skin is warm and dry.  Neurological:     General: No focal deficit present.     Mental Status: She is alert and oriented to person, place, and time.  Psychiatric:        Mood and Affect: Mood normal.      UC Treatments / Results  Labs (all labs ordered are listed, but only abnormal results are displayed) Labs Reviewed  POCT URINALYSIS DIPSTICK, ED / UC - Abnormal;  Notable for the following components:      Result Value   Hgb urine dipstick TRACE (*)    All other components within normal limits  URINE CULTURE  POC URINE PREG, ED    EKG   Radiology No results found.  Procedures Procedures (including critical care time)  Medications Ordered in UC Medications - No data to display  Initial Impression / Assessment and Plan / UC Course  I have reviewed the triage vital signs and the nursing notes.  Pertinent labs & imaging results that were available during my care of the patient were reviewed by me and considered in my medical decision making (see chart for details).    Assessment negative for red flags or concerns.  Urine positive for Hgb but otherwise negative, pregnancy test negative.  Will treat with Naproxen twice a day and Zanaflex as needed.  Encouraged fluids and rest.  May use heat, ice, or alternate heat and ice.  May take Tylenol for break through pain.  Encouraged  fluids and rest.  Follow up as needed.  Final Clinical Impressions(s) / UC Diagnoses   Final diagnoses:  Acute midline low back pain without sciatica  Generalized abdominal pain  Dysuria     Discharge Instructions     We will contact you if your lab work comes back positive.    Take the Naproxen as needed for pain.  You can also take Tylenol as needed for pain.   Take the Zanaflex as needed for muscle pain and spasms.    Rest and drink plenty of fluids.    Return or go to the Emergency Department if symptoms worsen or do not improve in the next few days.     ED Prescriptions    Medication Sig Dispense Auth. Provider   naproxen (NAPROSYN) 500 MG tablet Take 1 tablet (500 mg total) by mouth 2 (two) times daily. 14 tablet Chales Salmon R, NP   tiZANidine (ZANAFLEX) 4 MG tablet Take 1 tablet (4 mg total) by mouth every 6 (six) hours as needed for muscle spasms. 30 tablet Ivette Loyal, NP     PDMP not reviewed this encounter.   Ivette Loyal,  NP 10/04/20 1455

## 2020-10-04 NOTE — ED Triage Notes (Signed)
Pt in with c/o back pain x 1 week and abdominal pain & nausea for the last few days  Pt states it hurts to walk or use the bathroom'  Pt has taken tylenol and ibuprofen with no relief

## 2020-10-04 NOTE — Discharge Instructions (Addendum)
We will contact you if your lab work comes back positive.    Take the Naproxen as needed for pain.  You can also take Tylenol as needed for pain.   Take the Zanaflex as needed for muscle pain and spasms.    Rest and drink plenty of fluids.    Return or go to the Emergency Department if symptoms worsen or do not improve in the next few days.

## 2020-10-05 ENCOUNTER — Encounter (HOSPITAL_COMMUNITY): Payer: Self-pay

## 2020-10-05 ENCOUNTER — Inpatient Hospital Stay (HOSPITAL_COMMUNITY)
Admission: EM | Admit: 2020-10-05 | Discharge: 2020-10-09 | DRG: 948 | Disposition: A | Discharge status: Home / Self Care | Payer: 59 | Attending: Family Medicine | Admitting: Family Medicine

## 2020-10-05 ENCOUNTER — Emergency Department (HOSPITAL_COMMUNITY): Payer: 59

## 2020-10-05 ENCOUNTER — Other Ambulatory Visit: Payer: Self-pay

## 2020-10-05 DIAGNOSIS — R188 Other ascites: Principal | ICD-10-CM | POA: Diagnosis present

## 2020-10-05 DIAGNOSIS — K219 Gastro-esophageal reflux disease without esophagitis: Secondary | ICD-10-CM | POA: Diagnosis not present

## 2020-10-05 DIAGNOSIS — E669 Obesity, unspecified: Secondary | ICD-10-CM | POA: Diagnosis present

## 2020-10-05 DIAGNOSIS — Z6836 Body mass index (BMI) 36.0-36.9, adult: Secondary | ICD-10-CM

## 2020-10-05 DIAGNOSIS — E872 Acidosis: Secondary | ICD-10-CM | POA: Diagnosis present

## 2020-10-05 DIAGNOSIS — N179 Acute kidney failure, unspecified: Secondary | ICD-10-CM | POA: Diagnosis not present

## 2020-10-05 DIAGNOSIS — Z888 Allergy status to other drugs, medicaments and biological substances status: Secondary | ICD-10-CM

## 2020-10-05 DIAGNOSIS — N3289 Other specified disorders of bladder: Secondary | ICD-10-CM | POA: Diagnosis present

## 2020-10-05 DIAGNOSIS — K760 Fatty (change of) liver, not elsewhere classified: Secondary | ICD-10-CM | POA: Diagnosis present

## 2020-10-05 DIAGNOSIS — Z8249 Family history of ischemic heart disease and other diseases of the circulatory system: Secondary | ICD-10-CM

## 2020-10-05 DIAGNOSIS — J4540 Moderate persistent asthma, uncomplicated: Secondary | ICD-10-CM | POA: Diagnosis present

## 2020-10-05 DIAGNOSIS — R768 Other specified abnormal immunological findings in serum: Secondary | ICD-10-CM | POA: Diagnosis present

## 2020-10-05 DIAGNOSIS — Z83438 Family history of other disorder of lipoprotein metabolism and other lipidemia: Secondary | ICD-10-CM

## 2020-10-05 DIAGNOSIS — Z91013 Allergy to seafood: Secondary | ICD-10-CM

## 2020-10-05 DIAGNOSIS — Z20822 Contact with and (suspected) exposure to covid-19: Secondary | ICD-10-CM | POA: Diagnosis present

## 2020-10-05 DIAGNOSIS — Z823 Family history of stroke: Secondary | ICD-10-CM

## 2020-10-05 DIAGNOSIS — Z825 Family history of asthma and other chronic lower respiratory diseases: Secondary | ICD-10-CM

## 2020-10-05 DIAGNOSIS — Z833 Family history of diabetes mellitus: Secondary | ICD-10-CM

## 2020-10-05 DIAGNOSIS — M810 Age-related osteoporosis without current pathological fracture: Secondary | ICD-10-CM | POA: Diagnosis present

## 2020-10-05 DIAGNOSIS — Z886 Allergy status to analgesic agent status: Secondary | ICD-10-CM

## 2020-10-05 DIAGNOSIS — Z79899 Other long term (current) drug therapy: Secondary | ICD-10-CM

## 2020-10-05 DIAGNOSIS — E785 Hyperlipidemia, unspecified: Secondary | ICD-10-CM | POA: Diagnosis present

## 2020-10-05 DIAGNOSIS — R0602 Shortness of breath: Secondary | ICD-10-CM

## 2020-10-05 DIAGNOSIS — Z8349 Family history of other endocrine, nutritional and metabolic diseases: Secondary | ICD-10-CM

## 2020-10-05 DIAGNOSIS — R3911 Hesitancy of micturition: Secondary | ICD-10-CM | POA: Diagnosis present

## 2020-10-05 DIAGNOSIS — M1990 Unspecified osteoarthritis, unspecified site: Secondary | ICD-10-CM | POA: Diagnosis present

## 2020-10-05 DIAGNOSIS — R339 Retention of urine, unspecified: Secondary | ICD-10-CM | POA: Diagnosis present

## 2020-10-05 LAB — CBC
HCT: 38.8 % (ref 36.0–46.0)
HCT: 37.1 % (ref 36.0–46.0)
Hemoglobin: 12.4 g/dL (ref 12.0–15.0)
Hemoglobin: 12.1 g/dL (ref 12.0–15.0)
MCH: 25.8 pg — ABNORMAL LOW (ref 26.0–34.0)
MCH: 26.1 pg (ref 26.0–34.0)
MCHC: 32 g/dL (ref 30.0–36.0)
MCHC: 32.6 g/dL (ref 30.0–36.0)
MCV: 80.8 fL (ref 80.0–100.0)
MCV: 80 fL (ref 80.0–100.0)
Platelets: 330 10*3/uL (ref 150–400)
Platelets: 306 10*3/uL (ref 150–400)
RBC: 4.8 MIL/uL (ref 3.87–5.11)
RBC: 4.64 MIL/uL (ref 3.87–5.11)
RDW: 13.2 % (ref 11.5–15.5)
RDW: 13.2 % (ref 11.5–15.5)
WBC: 6.5 10*3/uL (ref 4.0–10.5)
WBC: 6.4 10*3/uL (ref 4.0–10.5)
nRBC: 0 % (ref 0.0–0.2)
nRBC: 0 % (ref 0.0–0.2)

## 2020-10-05 LAB — URINE CULTURE: Culture: <10000 — AB

## 2020-10-05 LAB — LIPASE, BLOOD: Lipase: 23 U/L (ref 11–51)

## 2020-10-05 LAB — I-STAT CHEM 8, ED
BUN: 35 mg/dL — ABNORMAL HIGH (ref 6–20)
Calcium, Ion: 1.04 mmol/L — ABNORMAL LOW (ref 1.15–1.40)
Chloride: 100 mmol/L (ref 98–111)
Creatinine, Ser: 3.1 mg/dL — ABNORMAL HIGH (ref 0.44–1.00)
Glucose, Bld: 84 mg/dL (ref 70–99)
HCT: 40 % (ref 36.0–46.0)
Hemoglobin: 13.6 g/dL (ref 12.0–15.0)
Potassium: 6.9 mmol/L (ref 3.5–5.1)
Sodium: 131 mmol/L — ABNORMAL LOW (ref 135–145)
TCO2: 27 mmol/L (ref 22–32)

## 2020-10-05 LAB — COMPREHENSIVE METABOLIC PANEL
ALT: 20 U/L (ref 0–44)
ALT: 18 U/L (ref 0–44)
AST: 26 U/L (ref 15–41)
AST: 22 U/L (ref 15–41)
Albumin: 4.9 g/dL (ref 3.5–5.0)
Albumin: 4.6 g/dL (ref 3.5–5.0)
Alkaline Phosphatase: 58 U/L (ref 38–126)
Alkaline Phosphatase: 56 U/L (ref 38–126)
Anion gap: 11 (ref 5–15)
Anion gap: 9 (ref 5–15)
BUN: 24 mg/dL — ABNORMAL HIGH (ref 6–20)
BUN: 23 mg/dL — ABNORMAL HIGH (ref 6–20)
CO2: 25 mmol/L (ref 22–32)
CO2: 23 mmol/L (ref 22–32)
Calcium: 9.2 mg/dL (ref 8.9–10.3)
Calcium: 8.8 mg/dL — ABNORMAL LOW (ref 8.9–10.3)
Chloride: 98 mmol/L (ref 98–111)
Chloride: 102 mmol/L (ref 98–111)
Creatinine, Ser: 2.83 mg/dL — ABNORMAL HIGH (ref 0.44–1.00)
Creatinine, Ser: 2.85 mg/dL — ABNORMAL HIGH (ref 0.44–1.00)
GFR, Estimated: 20 mL/min — ABNORMAL LOW (ref >60)
GFR, Estimated: 20 mL/min — ABNORMAL LOW (ref >60)
Glucose, Bld: 90 mg/dL (ref 70–99)
Glucose, Bld: 86 mg/dL (ref 70–99)
Potassium: 4.9 mmol/L (ref 3.5–5.1)
Potassium: 4.1 mmol/L (ref 3.5–5.1)
Sodium: 134 mmol/L — ABNORMAL LOW (ref 135–145)
Sodium: 134 mmol/L — ABNORMAL LOW (ref 135–145)
Total Bilirubin: 0.7 mg/dL (ref 0.3–1.2)
Total Bilirubin: 0.9 mg/dL (ref 0.3–1.2)
Total Protein: 8.9 g/dL — ABNORMAL HIGH (ref 6.5–8.1)
Total Protein: 8 g/dL (ref 6.5–8.1)

## 2020-10-05 LAB — CBG MONITORING, ED: Glucose-Capillary: 77 mg/dL (ref 70–99)

## 2020-10-05 LAB — URINALYSIS, ROUTINE W REFLEX MICROSCOPIC
Bilirubin Urine: NEGATIVE
Glucose, UA: NEGATIVE mg/dL
Hgb urine dipstick: NEGATIVE
Ketones, ur: NEGATIVE mg/dL
Leukocytes,Ua: NEGATIVE
Nitrite: NEGATIVE
Protein, ur: NEGATIVE mg/dL
Specific Gravity, Urine: 1.004 — ABNORMAL LOW (ref 1.005–1.030)
pH: 5 (ref 5.0–8.0)

## 2020-10-05 MED ORDER — FAMOTIDINE 20 MG PO TABS
20.0000 mg | ORAL_TABLET | Freq: Every day | ORAL | Status: DC
  Administered 2020-10-06 – 2020-10-09 (×3): 20 mg via ORAL
  Filled 2020-10-05 (×3): qty 1

## 2020-10-05 MED ORDER — MORPHINE SULFATE (PF) 2 MG/ML IV SOLN
1.0000 mg | INTRAVENOUS | Status: DC | PRN
  Administered 2020-10-06 (×2): 1 mg via INTRAVENOUS
  Filled 2020-10-05 (×2): qty 1

## 2020-10-05 MED ORDER — SODIUM ZIRCONIUM CYCLOSILICATE 10 G PO PACK
10.0000 g | PACK | Freq: Once | ORAL | Status: DC
  Filled 2020-10-05: qty 1

## 2020-10-05 MED ORDER — DEXTROSE 50 % IV SOLN
1.0000 | Freq: Once | INTRAVENOUS | Status: DC
  Filled 2020-10-05: qty 50

## 2020-10-05 MED ORDER — ALBUTEROL SULFATE HFA 108 (90 BASE) MCG/ACT IN AERS: 2.0000 | INHALATION_SPRAY | Freq: Four times a day (QID) | RESPIRATORY_TRACT | Status: DC | PRN

## 2020-10-05 MED ORDER — PANTOPRAZOLE SODIUM 40 MG PO TBEC
40.0000 mg | DELAYED_RELEASE_TABLET | Freq: Every day | ORAL | Status: DC
  Administered 2020-10-06 – 2020-10-09 (×3): 40 mg via ORAL
  Filled 2020-10-05 (×3): qty 1

## 2020-10-05 MED ORDER — SODIUM BICARBONATE 8.4 % IV SOLN
50.0000 meq | Freq: Once | INTRAVENOUS | Status: DC
  Filled 2020-10-05: qty 50

## 2020-10-05 MED ORDER — LACTATED RINGERS IV SOLN
INTRAVENOUS | Status: AC
  Administered 2020-10-06: 1000 mL via INTRAVENOUS

## 2020-10-05 MED ORDER — ZOLPIDEM TARTRATE 5 MG PO TABS
5.0000 mg | ORAL_TABLET | Freq: Every evening | ORAL | Status: DC | PRN
  Administered 2020-10-08: 5 mg via ORAL
  Filled 2020-10-05: qty 1

## 2020-10-05 MED ORDER — ONDANSETRON HCL 4 MG/2ML IJ SOLN
4.0000 mg | Freq: Four times a day (QID) | INTRAMUSCULAR | Status: DC | PRN
  Administered 2020-10-06 (×2): 4 mg via INTRAVENOUS
  Filled 2020-10-05 (×2): qty 2

## 2020-10-05 MED ORDER — LACTATED RINGERS IV BOLUS
1000.0000 mL | Freq: Once | INTRAVENOUS | Status: AC
  Administered 2020-10-05: 1000 mL via INTRAVENOUS

## 2020-10-05 MED ORDER — INSULIN ASPART 100 UNIT/ML IV SOLN
5.0000 [IU] | Freq: Once | INTRAVENOUS | Status: DC
  Filled 2020-10-05: qty 0.05

## 2020-10-05 NOTE — ED Provider Notes (Signed)
[facility]-EMERGENCY DEPT Provider Note   CSN: [id number] Arrival date & time: 10/05/20  1827     History Chief Complaint  Patient presents with  . Abdominal Pain    [name] is a 45 y.o. female.   Abdominal Pain Pain location:  Generalized Pain quality: aching   Pain radiates to:  Does not radiate Pain severity:  Moderate Onset quality:  Gradual Timing:  Constant Progression:  Worsening Chronicity:  New Relieved by:  Nothing Worsened by:  Nothing Associated symptoms: anorexia, chills, dysuria, fatigue and nausea   Associated symptoms: no chest pain, no cough, no diarrhea, no fever, no shortness of breath and no vomiting        Past Medical History:  Diagnosis Date  . Allergy   . Anemia   . Angio-edema   . Anxiety   . Apnea 12/08/2019  . Arthritis   . Asthma   . Back pain   . Constipation   . Exertional dyspnea 03/14/2020  . Fatty liver   . GERD (gastroesophageal reflux disease)   . H/O: hysterectomy   . Hx of endometriosis   . Inappropriate sinus tachycardia 12/08/2019  . Insomnia   . Joint pain   . Lactose intolerance   . Macromastia 07/2011  . Migraine   . Morbid obesity (HCC) 12/08/2019  . Osteoporosis   . Urticaria   . Vitamin D deficiency     Patient Active Problem List   Diagnosis Date Noted  . Other insomnia 07/27/2020  . At risk for side effect of medication 07/27/2020  . Other hyperlipidemia 05/15/2020  . At risk for osteoporosis 05/15/2020  . At risk for impaired metabolic function 04/04/2020  . Other fatigue 03/22/2020  . SOBOE (shortness of breath on exertion) 03/22/2020  . Exertional dyspnea 03/14/2020  . Neuropathy, right side of face 03/08/2020  . At risk for depression 03/08/2020  . Primary osteoarthritis of both feet 02/26/2020  . Vitamin D deficiency, unspecified 01/26/2020  . Hyperlipidemia 01/26/2020  . Prediabetes 01/26/2020  . At risk for heart disease 01/26/2020  . Daytime somnolence 01/26/2020   . Vitamin D deficiency 01/04/2020  . Fatty liver 01/04/2020  . B12 deficiency 01/04/2020  . Moderate persistent asthma without complication 12/31/2019  . Inappropriate sinus tachycardia 12/08/2019  . Class 3 severe obesity with serious comorbidity and body mass index (BMI) of 40.0 to 44.9 in adult (HCC) 12/08/2019  . Apnea 12/08/2019  . Anaphylactic reaction due to food, subsequent encounter 11/22/2019  . Allergic rhinoconjunctivitis 11/22/2019  . Idiopathic urticaria 11/22/2019  . Generalized anxiety disorder 04/08/2018  . Atypical chest pain   . Regurgitation of food   . Laryngopharyngeal reflux (LPR) 07/30/2017  . Throat soreness 07/30/2017  . ANA positive 12/30/2016  . Gastroesophageal reflux disease 06/27/2016  . Disturbance of skin sensation 04/27/2014  . Dizziness and giddiness 03/14/2014  . Headache, migraine 03/14/2014  . Myalgia and myositis, unspecified 06/07/2013  . Nausea and vomiting in adult 08/22/2012  . Viral syndrome 08/22/2012  . Pelvic pain in female 12/18/2010  . PID (acute pelvic inflammatory disease) 12/18/2010  . ANEMIA-IRON DEFICIENCY 10/15/2006  . HERPES, GENITAL NOS 09/30/2006  . Obesity (BMI 35.0-39.9 without comorbidity) 09/30/2006  . DISORDER, NONORGANIC SLEEP NOS 09/30/2006  . CONSTIPATION NOS 09/30/2006  . NEURITIS, LUMBOSACRAL NOS 09/30/2006  . HX, PERSONAL, MENTAL DISORDER NOS 09/30/2006    Past Surgical History:  Procedure Laterality Date  . 24 HOUR PH STUDY N/A 01/12/2018   Procedure: 24 HOUR PH  STUDY;  Surgeon: Napoleon Form, MD;  Location: [facility];  Service: Endoscopy;  Laterality: N/A;  . ABDOMINAL HYSTERECTOMY  12/12/2003  . ADENOIDECTOMY  05/2011  . ANKLE ARTHROSCOPY  12/29/2007   right; with extensive debridement  . BLADDER NECK RECONSTRUCTION  01/14/2011   Procedure: BLADDER NECK REPAIR;  Surgeon: Reva Bores, MD;  Location: [facility];  Service: Gynecology;  Laterality: N/A;  Laparoscopic Repair of Incidental Cystotomy  .  BREAST REDUCTION SURGERY  08/05/2011   Procedure: MAMMARY REDUCTION  (BREAST);  Surgeon: Louisa Second, MD;  Location: [facility];  Service: Plastics;  Laterality: Bilateral;  bilateral  . BREAST SURGERY  2012   breast reduction  . CESAREAN SECTION  12/29/2000; 1994  . DILATION AND CURETTAGE OF UTERUS  07/08/2003   open laparoscopy  . ESOPHAGEAL MANOMETRY N/A 01/12/2018   Procedure: ESOPHAGEAL MANOMETRY (EM);  Surgeon: Napoleon Form, MD;  Location: [facility];  Service: Endoscopy;  Laterality: N/A;  . GIVENS CAPSULE STUDY N/A 06/23/2017   Procedure: GIVENS CAPSULE STUDY;  Surgeon: Benancio Deeds, MD;  Location: [facility];  Service: Gastroenterology;  Laterality: N/A;  . LAPAROSCOPIC SALPINGOOPHERECTOMY  01/14/2011   left  . PH IMPEDANCE STUDY N/A 01/12/2018   Procedure: PH IMPEDANCE STUDY;  Surgeon: Napoleon Form, MD;  Location: [facility];  Service: Endoscopy;  Laterality: N/A;  . REPAIR PERONEAL TENDONS ANKLE  01/03/2010   repair right subluxing peroneal tendons  . RESECTION DISTAL CLAVICAL Right 09/25/2017   Procedure: RESECTION DISTAL CLAVICAL;  Surgeon: Bjorn Pippin, MD;  Location: [facility];  Service: Orthopedics;  Laterality: Right;  . RIGHT OOPHORECTOMY     with lysis of adhesions  . SHOULDER ACROMIOPLASTY Right 09/25/2017   Procedure: SHOULDER ACROMIOPLASTY;  Surgeon: Bjorn Pippin, MD;  Location: [facility];  Service: Orthopedics;  Laterality: Right;  . SHOULDER ARTHROSCOPY WITH DEBRIDEMENT AND BICEP TENDON REPAIR Right 09/25/2017   Procedure: SHOULDER ARTHROSCOPY WITH DEBRIDEMENT AND BICEP TENDON REPAIR;  Surgeon: Bjorn Pippin, MD;  Location: [facility];  Service: Orthopedics;  Laterality: Right;  . SHOULDER ARTHROSCOPY WITH ROTATOR CUFF REPAIR Right 09/25/2017   Procedure: SHOULDER ARTHROSCOPY WITH ROTATOR CUFF REPAIR;  Surgeon: Bjorn Pippin, MD;  Location: [facility];  Service:  Orthopedics;  Laterality: Right;  . TONSILLECTOMY  as a child  . TUBAL LIGATION  12/29/2000  . UPPER GASTROINTESTINAL ENDOSCOPY       OB History    Gravida  2   Para  2   Term  2   Preterm      AB      Living  2     SAB      IAB      Ectopic      Multiple      Live Births              Family History  Problem Relation Age of Onset  . Diabetes Mother   . Hypertension Mother   . Hyperlipidemia Mother   . Sleep apnea Mother   . Thyroid disease Sister   . Dementia Father   . Heart attack Maternal Grandmother   . Stroke Maternal Grandfather   . Asthma Son   . Healthy Son   . Asthma Daughter   . Healthy Daughter   . Colon cancer Neg Hx   . Colon polyps Neg Hx   . Esophageal cancer Neg Hx   . Rectal cancer  Neg Hx   . Stomach cancer Neg Hx     Social History   Tobacco Use  . Smoking status: Never Smoker  . Smokeless tobacco: Never Used  Vaping Use  . Vaping Use: Never used  Substance Use Topics  . Alcohol use: Yes    Comment: occasional   . Drug use: No    Home Medications Prior to Admission medications   Medication Sig Start Date End Date Taking? Authorizing Provider  albuterol (VENTOLIN HFA) 108 (90 Base) MCG/ACT inhaler Inhale 2 puffs into the lungs every 6 (six) hours as needed for wheezing or shortness of breath. 01/25/20  Yes Charlott Holleresai, Nikita S, MD  calcium carbonate (OS-CAL - DOSED IN MG OF ELEMENTAL CALCIUM) 1250 (500 Ca) MG tablet Take 1 tablet by mouth.    [provider]  cetirizine (ZYRTEC) 10 MG tablet TAKE 1 TABLET (10 MG TOTAL) BY MOUTH DAILY AS NEEDED FOR RHINITIS. 05/16/20   Ambs, Norvel RichardsAnne M, FNP  DULoxetine (CYMBALTA) 20 MG capsule duloxetine 20 mg capsule,delayed release 08/08/20   [provider]  EPINEPHrine (EPIPEN 2-PAK) 0.3 mg/0.3 mL IJ SOAJ injection Inject 0.3 mg into the muscle as needed for anaphylaxis. 07/07/20   Hetty BlendAmbs, Anne M, FNP  esomeprazole (NEXIUM) 40 MG capsule TAKE 1 CAPSULE (40 MG TOTAL) BY MOUTH DAILY AT 12  NOON. 03/24/20   Armbruster, Willaim RayasSteven P, MD  famotidine (PEPCID) 20 MG tablet Take 20 mg by mouth daily. 01/22/20   [provider]  Ferrous Sulfate (IRON PO) Take by mouth daily.    [provider]  fluticasone furoate-vilanterol (BREO ELLIPTA) 200-25 MCG/INH AEPB Inhale 1 puff into the lungs daily. 03/10/20   Glenford BayleyWalsh, Elizabeth W, NP  naproxen (NAPROSYN) 500 MG tablet Take 1 tablet (500 mg total) by mouth 2 (two) times daily. 10/04/20   Ivette LoyalSmith, Fowler R, NP  ondansetron (ZOFRAN) 4 MG tablet Take 1 tablet (4 mg total) by mouth daily as needed for nausea or vomiting. 04/19/20 04/19/21  Arnette FeltsMoore, Janece, FNP  RESTASIS 0.05 % ophthalmic emulsion 1 drop 2 (two) times daily. 11/04/19   [provider]  sucralfate (CARAFATE) 1 g tablet TAKE 1 TABLET (1 G TOTAL) BY MOUTH EVERY 6 (SIX) HOURS. SLOWLY DISSOLVE TABLET IN 1 TABLESPOON OF DISTILLED WATER BEFORE INGESTION 04/21/20   Armbruster, Willaim RayasSteven P, MD  tiZANidine (ZANAFLEX) 4 MG tablet Take 1 tablet (4 mg total) by mouth every 6 (six) hours as needed for muscle spasms. 10/04/20   Ivette LoyalSmith, Fowler R, NP  Vitamin D, Ergocalciferol, (DRISDOL) 1.25 MG (50000 UNIT) CAPS capsule 1 po q 2 wks 07/26/20   Opalski, Gavin Poundeborah, DO  zolpidem (AMBIEN CR) 12.5 MG CR tablet Take 12.5 mg by mouth at bedtime as needed. 09/13/19   [provider]    Allergies    Aspirin, Ibuprofen, Salmon [fish allergy], and Zoledronic acid  Review of Systems   Review of Systems  Constitutional: Positive for chills and fatigue. Negative for fever.  HENT: Negative for congestion and rhinorrhea.   Respiratory: Negative for cough and shortness of breath.   Cardiovascular: Negative for chest pain and palpitations.  Gastrointestinal: Positive for abdominal pain, anorexia and nausea. Negative for diarrhea and vomiting.  Genitourinary: Positive for dysuria. Negative for difficulty urinating.  Musculoskeletal: Negative for arthralgias and back pain.  Skin: Negative for rash and  wound.  Neurological: Negative for light-headedness and headaches.    Physical Exam Updated Vital Signs BP (!) 138/101   Pulse 72   Temp 97.8 F (36.6  C) (Oral)   Resp (!) 23   Ht  (1.626 m)   Wt 95.3 kg   LMP 12/27/2003   SpO2 100%   BMI 36.05 kg/m   Physical Exam Vitals and nursing note reviewed. Exam conducted with a chaperone present.  Constitutional:      General: She is not in acute distress.    Appearance: Normal appearance.  HENT:     Head: Normocephalic and atraumatic.     Nose: No rhinorrhea.  Eyes:     General:        Right eye: No discharge.        Left eye: No discharge.     Conjunctiva/sclera: Conjunctivae normal.  Cardiovascular:     Rate and Rhythm: Normal rate and regular rhythm.  Pulmonary:     Effort: Pulmonary effort is normal. No respiratory distress.     Breath sounds: No stridor.  Abdominal:     General: Abdomen is flat. There is no distension.     Palpations: Abdomen is soft.     Tenderness: There is generalized abdominal tenderness. There is no guarding or rebound. Negative signs include Murphy's sign, Rovsing's sign and McBurney's sign.  Musculoskeletal:        General: No tenderness or signs of injury.  Skin:    General: Skin is warm and dry.     Capillary Refill: Capillary refill takes less than 2 seconds.  Neurological:     General: No focal deficit present.     Mental Status: She is alert. Mental status is at baseline.     Motor: No weakness.  Psychiatric:        Mood and Affect: Mood normal.        Behavior: Behavior normal.     ED Results / Procedures / Treatments   Labs (all labs ordered are listed, but only abnormal results are displayed) Labs Reviewed  COMPREHENSIVE METABOLIC PANEL - Abnormal; Notable for the following components:      Result Value   Sodium 134 (*)    BUN 24 (*)    Creatinine, Ser 2.83 (*)    Total Protein 8.9 (*)    GFR, Estimated 20 (*)    All other components within normal limits  CBC -  Abnormal; Notable for the following components:   MCH 25.8 (*)    All other components within normal limits  URINALYSIS, ROUTINE W REFLEX MICROSCOPIC - Abnormal; Notable for the following components:   Color, Urine COLORLESS (*)    Specific Gravity, Urine 1.004 (*)    All other components within normal limits  I-STAT CHEM 8, ED - Abnormal; Notable for the following components:   Sodium 131 (*)    Potassium 6.9 (*)    BUN 35 (*)    Creatinine, Ser 3.10 (*)    Calcium, Ion 1.04 (*)    All other components within normal limits  SARS CORONAVIRUS 2 (TAT 6-24 HRS)  LIPASE, BLOOD  CBC  COMPREHENSIVE METABOLIC PANEL  I-STAT BETA HCG BLOOD, ED ([facility], [facility], [facility] ONLY)  CBG MONITORING, ED    EKG EKG Interpretation  Date/Time:  Thursday October 05 2020 20:12:48 EDT Ventricular Rate:  75 PR Interval:  158 QRS Duration: 87 QT Interval:  383 QTC Calculation: 428 R Axis:   79 Text Interpretation: Sinus rhythm RSR' in V1 or V2, probably normal variant Confirmed by Cherlynn Perches ([id number]) on 10/05/2020 8:27:33 PM   Radiology CT ABDOMEN PELVIS WO CONTRAST  Result Date: 10/05/2020 CLINICAL DATA:  Abdomen pain EXAM: CT ABDOMEN AND PELVIS WITHOUT CONTRAST TECHNIQUE: Multidetector CT imaging of the abdomen and pelvis was performed following the standard protocol without IV contrast. COMPARISON:  11/01/2015 CT, MRI 02/10/2017 FINDINGS: Lower chest: Lung bases demonstrate streaky atelectasis in the lower lobes and right middle lobe. No significant pleural effusion. Normal cardiac size. Hepatobiliary: No focal hepatic abnormality. Status post cholecystectomy. No biliary dilatation. Pancreas: Unremarkable. No pancreatic ductal dilatation or surrounding inflammatory changes. Spleen: Normal in size without focal abnormality. Adrenals/Urinary Tract: Adrenal glands are within normal limits. Kidneys show no hydronephrosis. Dilated urinary bladder with wall thickening. Focal wall thickening and calcification or surgical change  along the superolateral aspect of the left bladder similar as compared with 2017 exam. Stomach/Bowel: The stomach is nonenlarged. No dilated small bowel. No acute bowel wall thickening Vascular/Lymphatic: Nonaneurysmal aorta.  No suspicious nodes Reproductive: Status post hysterectomy. No adnexal masses. Other: No free air. Moderate to large volume of ascites within the abdomen and pelvis. Stranding or mild infiltrated appearance of the omentum and upper abdominal mesentery. Musculoskeletal: No acute or suspicious osseous abnormality. IMPRESSION: 1. Moderate to large volume of ascites within the abdomen and pelvis of uncertain source. Hazy appearance of the omentum and upper abdominal mesentery, uncertain if this is due to edema or peritoneal disease. Consider correlation with diagnostic paracentesis. 2. Diffuse wall thickening of the bladder with focal wall thickening and calcification or postsurgical change along the left superolateral bladder as before. Electronically Signed   By: Jasmine Pang M.D.   On: 10/05/2020 21:03    Procedures Procedures   Medications Ordered in ED Medications - No data to display  ED Course  I have reviewed the triage vital signs and the nursing notes.  Pertinent labs & imaging results that were available during my care of the patient were reviewed by me and considered in my medical decision making (see chart for details).    MDM Rules/Calculators/A&P                          Patient has worsening abdominal pain.  Was seen in urgent care had a negative urine was told to follow-up with pain does not get better.  She is here today with worsening pain.  She will be given pain control.  She will be given antiemetics and fluids.  Her creatinine is markedly elevated.  An i-STAT showed an elevated potassium however true potassium is not elevated.  She may have received hyper kalemia treatment partially.  She does not need continued treatment.  She will get a CT scan without  contrast due to her kidney injury.  No focal source of infection right now.  No fevers but does have subjective fever chills at home.  CT scan shows ascites but no other significant complications.  I did a quick bedside ultrasound to see if there is a fluid pocket big enough that I felt like I could do diagnostic paracentesis.  I feel that she has intestinal material varicosed abdominal wall muscle quadrants I would typically attempt paracentesis.  Without fever without leukocytosis without known history of ascites I feel like she is better served by interventional radiology.  I will consult medicine for admission in the setting of AKI and new onset ascites.  The patient will be admitted to the hospitalist.  For the remainder this patient's care please see inpatient team notes.  I will intervene as needed while the patient remains in the emergency department.  Final  Clinical Impression(s) / ED Diagnoses Final diagnoses:  AKI (acute kidney injury) (HCC)  Other ascites    Rx / DC Orders ED Discharge Orders    None       Sabino Donovan, MD 10/05/20 2153

## 2020-10-05 NOTE — ED Triage Notes (Signed)
Patient c/o generalized abdominal pain x 1 week. Patient c/o nausea, but denies vomiting or diarrhea. Patient reports that she has increased pain in the lower abdominal area when she walks.

## 2020-10-05 NOTE — H&P (Signed)
History and Physical    [name]:[id number] DOB: [birth year] DOA: 10/05/2020  PCP: [name], FNP  Patient coming from: Home  I have personally briefly reviewed patient's old medical records in [facility]  Chief Complaint: abdominal pain  HPI: [name] is a 45 y.o. female with medical history significant for obesity, moderate persistent asthma, anemia and GERD who presents with persistent abdominal pain.  She reports having diffuse cramping abdominal pain for the past week as well as dysuria and some urinary hesitancy.  Pain has been constant and worse with ambulation and eating.  Has had decreased p.o. intake and nausea but no vomiting.  Stool has been loose but no diarrhea.  No fevers.  Patient has history of 2 C-section, hysterectomy and cholecystectomy.  Has only occasional alcohol use.  No recent weight loss. No family hx of abdominal cancers or disease on mother's side. Does not know her father.   She presented with the symptoms to urgent care today and had UA showing showing hemoglobin and was asked to present to ED for worsening symptoms.  ED Course: She was hypertensive up to systolic of 150s.  CBC was negative.  Creatinine of 2.85 from prior of 0.88.  LFTs are normal. CT Abdomen shows moderate to large volume ascites within the abdomen and pelvis of uncertain origin.  Bedside ultrasound performed by ED Dr. Myrtis Ser but paracentesis not attempted due to intestines being close to abdominal wall.  Review of Systems: Constitutional: No Weight Change, No Fever ENT/Mouth: No sore throat, No Rhinorrhea Eyes: No Eye Pain, No Vision Changes Cardiovascular: No Chest Pain, no SOB Respiratory: No Cough, No Sputum, No Wheezing, no Dyspnea  Gastrointestinal: + Nausea, No Vomiting, No Diarrhea, No Constipation, No Pain Genitourinary: no Urinary Incontinence, No Urgency, No Flank Pain Musculoskeletal: No Arthralgias, No Myalgias Skin: No Skin Lesions, No  Pruritus, Neuro: no Weakness, No Numbness  +decrease appetite Heme/Lymph: No Bruising, No Bleeding  Past Medical History:  Diagnosis Date  . Allergy   . Anemia   . Angio-edema   . Anxiety   . Apnea 12/08/2019  . Arthritis   . Asthma   . Back pain   . Constipation   . Exertional dyspnea 03/14/2020  . Fatty liver   . GERD (gastroesophageal reflux disease)   . H/O: hysterectomy   . Hx of endometriosis   . Inappropriate sinus tachycardia 12/08/2019  . Insomnia   . Joint pain   . Lactose intolerance   . Macromastia 07/2011  . Migraine   . Morbid obesity (HCC) 12/08/2019  . Osteoporosis   . Urticaria   . Vitamin D deficiency     Past Surgical History:  Procedure Laterality Date  . 24 HOUR PH STUDY N/A 01/12/2018   Procedure: 24 HOUR PH STUDY;  Surgeon: Napoleon Form, MD;  Location: [facility];  Service: Endoscopy;  Laterality: N/A;  . ABDOMINAL HYSTERECTOMY  12/12/2003  . ADENOIDECTOMY  05/2011  . ANKLE ARTHROSCOPY  12/29/2007   right; with extensive debridement  . BLADDER NECK RECONSTRUCTION  01/14/2011   Procedure: BLADDER NECK REPAIR;  Surgeon: Reva Bores, MD;  Location: [facility];  Service: Gynecology;  Laterality: N/A;  Laparoscopic Repair of Incidental Cystotomy  . BREAST REDUCTION SURGERY  08/05/2011   Procedure: MAMMARY REDUCTION  (BREAST);  Surgeon: Louisa Second, MD;  Location: [facility];  Service: Plastics;  Laterality: Bilateral;  bilateral  . BREAST SURGERY  2012   breast reduction  . CESAREAN  SECTION  12/29/2000; 1994  . DILATION AND CURETTAGE OF UTERUS  07/08/2003   open laparoscopy  . ESOPHAGEAL MANOMETRY N/A 01/12/2018   Procedure: ESOPHAGEAL MANOMETRY (EM);  Surgeon: Napoleon Form, MD;  Location: [facility];  Service: Endoscopy;  Laterality: N/A;  . GIVENS CAPSULE STUDY N/A 06/23/2017   Procedure: GIVENS CAPSULE STUDY;  Surgeon: Benancio Deeds, MD;  Location: [facility];  Service: Gastroenterology;  Laterality: N/A;  . LAPAROSCOPIC  SALPINGOOPHERECTOMY  01/14/2011   left  . PH IMPEDANCE STUDY N/A 01/12/2018   Procedure: PH IMPEDANCE STUDY;  Surgeon: Napoleon Form, MD;  Location: [facility];  Service: Endoscopy;  Laterality: N/A;  . REPAIR PERONEAL TENDONS ANKLE  01/03/2010   repair right subluxing peroneal tendons  . RESECTION DISTAL CLAVICAL Right 09/25/2017   Procedure: RESECTION DISTAL CLAVICAL;  Surgeon: Bjorn Pippin, MD;  Location: [facility];  Service: Orthopedics;  Laterality: Right;  . RIGHT OOPHORECTOMY     with lysis of adhesions  . SHOULDER ACROMIOPLASTY Right 09/25/2017   Procedure: SHOULDER ACROMIOPLASTY;  Surgeon: Bjorn Pippin, MD;  Location: [facility];  Service: Orthopedics;  Laterality: Right;  . SHOULDER ARTHROSCOPY WITH DEBRIDEMENT AND BICEP TENDON REPAIR Right 09/25/2017   Procedure: SHOULDER ARTHROSCOPY WITH DEBRIDEMENT AND BICEP TENDON REPAIR;  Surgeon: Bjorn Pippin, MD;  Location: [facility];  Service: Orthopedics;  Laterality: Right;  . SHOULDER ARTHROSCOPY WITH ROTATOR CUFF REPAIR Right 09/25/2017   Procedure: SHOULDER ARTHROSCOPY WITH ROTATOR CUFF REPAIR;  Surgeon: Bjorn Pippin, MD;  Location: [facility];  Service: Orthopedics;  Laterality: Right;  . TONSILLECTOMY  as a child  . TUBAL LIGATION  12/29/2000  . UPPER GASTROINTESTINAL ENDOSCOPY       reports that she has never smoked. She has never used smokeless tobacco. She reports current alcohol use. She reports that she does not use drugs. Social History  Allergies  Allergen Reactions  . Aspirin   . Ibuprofen   . Salmon [Fish Allergy]   . Zoledronic Acid Other (See Comments)    Family History  Problem Relation Age of Onset  . Diabetes Mother   . Hypertension Mother   . Hyperlipidemia Mother   . Sleep apnea Mother   . Thyroid disease Sister   . Dementia Father   . Heart attack Maternal Grandmother   . Stroke Maternal Grandfather   . Asthma Son   . Healthy Son   .  Asthma Daughter   . Healthy Daughter   . Colon cancer Neg Hx   . Colon polyps Neg Hx   . Esophageal cancer Neg Hx   . Rectal cancer Neg Hx   . Stomach cancer Neg Hx      Prior to Admission medications   Medication Sig Start Date End Date Taking? Authorizing Provider  acetaminophen (TYLENOL) 500 MG tablet Take 1,000 mg by mouth every 6 (six) hours as needed for moderate pain.   Yes [provider]  albuterol (VENTOLIN HFA) 108 (90 Base) MCG/ACT inhaler Inhale 2 puffs into the lungs every 6 (six) hours as needed for wheezing or shortness of breath. 01/25/20  Yes Charlott Holler, MD  calcium carbonate (OS-CAL - DOSED IN MG OF ELEMENTAL CALCIUM) 1250 (500 Ca) MG tablet Take 1 tablet by mouth.   Yes [provider]  cetirizine (ZYRTEC) 10 MG tablet TAKE 1 TABLET (10 MG TOTAL) BY MOUTH DAILY AS NEEDED FOR RHINITIS. 05/16/20  Yes Ambs, Norvel Richards, FNP  clindamycin (CLEOCIN T) 1 % lotion Apply 1 application topically daily.   Yes [provider]  EPINEPHrine (EPIPEN 2-PAK) 0.3 mg/0.3 mL IJ SOAJ injection Inject 0.3 mg into the muscle as needed for anaphylaxis. Patient taking differently: Inject 0.3 mg into the muscle once as needed for anaphylaxis. 07/07/20  Yes Ambs, Norvel Richards, FNP  esomeprazole (NEXIUM) 40 MG capsule TAKE 1 CAPSULE (40 MG TOTAL) BY MOUTH DAILY AT 12 NOON. 03/24/20  Yes Armbruster, Willaim Rayas, MD  famotidine (PEPCID) 20 MG tablet Take 20 mg by mouth daily. 01/22/20  Yes [provider]  fluticasone furoate-vilanterol (BREO ELLIPTA) 100-25 MCG/INH AEPB Inhale 1 puff into the lungs daily as needed.   Yes [provider]  HYDROcodone-acetaminophen (NORCO/VICODIN) 5-325 MG tablet Take 1 tablet by mouth every 6 (six) hours as needed for severe pain. 09/15/20  Yes [provider]  naproxen (NAPROSYN) 500 MG tablet Take 1 tablet (500 mg total) by mouth 2 (two) times daily. Patient taking differently: Take 500 mg by mouth 2 (two) times daily with a  meal. 10/04/20  Yes Ivette Loyal, NP  ondansetron (ZOFRAN) 4 MG tablet Take 1 tablet (4 mg total) by mouth daily as needed for nausea or vomiting. 04/19/20 04/19/21 Yes [name], FNP  RESTASIS 0.05 % ophthalmic emulsion Place 1 drop into both eyes 2 (two) times daily. 11/04/19  Yes [provider]  sucralfate (CARAFATE) 1 g tablet TAKE 1 TABLET (1 G TOTAL) BY MOUTH EVERY 6 (SIX) HOURS. SLOWLY DISSOLVE TABLET IN 1 TABLESPOON OF DISTILLED WATER BEFORE INGESTION 04/21/20  Yes Armbruster, Willaim Rayas, MD  Vitamin D, Ergocalciferol, (DRISDOL) 1.25 MG (50000 UNIT) CAPS capsule 1 po q 2 wks Patient taking differently: Take 50,000 Units by mouth every 7 (seven) days. 07/26/20  Yes Opalski, Gavin Pound, DO  zolpidem (AMBIEN CR) 12.5 MG CR tablet Take 12.5 mg by mouth at bedtime as needed for sleep. 09/13/19  Yes [provider]  tiZANidine (ZANAFLEX) 4 MG tablet Take 1 tablet (4 mg total) by mouth every 6 (six) hours as needed for muscle spasms. 10/04/20   Ivette Loyal, NP    Physical Exam: Vitals:   10/05/20 2140 10/05/20 2200 10/05/20 2230 10/05/20 2305  BP: (!) 138/101 (!) 144/100 (!) 140/96 (!) 140/100  Pulse: 72 81 (!) 101 88  Resp: (!) 23 (!) 24 20 (!) 32  Temp:      TempSrc:      SpO2: 100% 100% 100% 100%  Weight:      Height:        Constitutional: NAD, calm, obese female laying flat in bed in discomfort  Vitals:   10/05/20 2140 10/05/20 2200 10/05/20 2230 10/05/20 2305  BP: (!) 138/101 (!) 144/100 (!) 140/96 (!) 140/100  Pulse: 72 81 (!) 101 88  Resp: (!) 23 (!) 24 20 (!) 32  Temp:      TempSrc:      SpO2: 100% 100% 100% 100%  Weight:      Height:       Eyes: PERRL, lids and conjunctivae normal ENMT: Mucous membranes are moist.  Neck: normal, supple Respiratory: clear to auscultation bilaterally, no wheezing, no crackles. Normal respiratory effort. No accessory muscle use.  Cardiovascular: Regular rate and rhythm, no murmurs / rubs / gallops. No extremity edema.   Abdomen: Moderately distended abdomen with diffuse abdominal pain without guarding, rebound tenderness or rigidity, no masses palpated. Bowel sounds positive.  Musculoskeletal: no clubbing / cyanosis. No joint deformity upper and  lower extremities. Good ROM, no contractures. Normal muscle tone.  Skin: no rashes, lesions, ulcers. No induration Neurologic: CN 2-12 grossly intact. Sensation intact, . Strength 5/5 in all 4.  Psychiatric: Normal judgment and insight. Alert and oriented x 3. Normal mood.    Labs on Admission: I have personally reviewed following labs and imaging studies  CBC: Recent Labs  Lab 10/05/20 1855 10/05/20 1944 10/05/20 2159  WBC 6.5  --  6.4  HGB 12.4 13.6 12.1  HCT 38.8 40.0 37.1  MCV 80.8  --  80.0  PLT 330  --  306   Basic Metabolic Panel: Recent Labs  Lab 10/05/20 1855 10/05/20 1944 10/05/20 2159  NA 134* 131* 134*  K 4.9 6.9* 4.1  CL 98 100 102  CO2 25  --  23  GLUCOSE 90 84 86  BUN 24* 35* 23*  CREATININE 2.83* 3.10* 2.85*  CALCIUM 9.2  --  8.8*   GFR: Estimated Creatinine Clearance: 27.9 mL/min (A) (by C-G formula based on SCr of 2.85 mg/dL (H)). Liver Function Tests: Recent Labs  Lab 10/05/20 1855 10/05/20 2159  AST 26 22  ALT 20 18  ALKPHOS 58 56  BILITOT 0.7 0.9  PROT 8.9* 8.0  ALBUMIN 4.9 4.6   Recent Labs  Lab 10/05/20 1855  LIPASE 23   No results for input(s): AMMONIA in the last 168 hours. Coagulation Profile: No results for input(s): INR, PROTIME in the last 168 hours. Cardiac Enzymes: No results for input(s): CKTOTAL, CKMB, CKMBINDEX, TROPONINI in the last 168 hours. BNP (last 3 results) No results for input(s): PROBNP in the last 8760 hours. HbA1C: No results for input(s): HGBA1C in the last 72 hours. CBG: Recent Labs  Lab 10/05/20 2013  GLUCAP 77   Lipid Profile: No results for input(s): CHOL, HDL, LDLCALC, TRIG, CHOLHDL, LDLDIRECT in the last 72 hours. Thyroid Function Tests: No results for input(s):  TSH, T4TOTAL, FREET4, T3FREE, THYROIDAB in the last 72 hours. Anemia Panel: No results for input(s): VITAMINB12, FOLATE, FERRITIN, TIBC, IRON, RETICCTPCT in the last 72 hours. Urine analysis:    Component Value Date/Time   COLORURINE COLORLESS (A) 10/05/2020 2030   APPEARANCEUR CLEAR 10/05/2020 2030   LABSPEC 1.004 (L) 10/05/2020 2030   PHURINE 5.0 10/05/2020 2030   GLUCOSEU NEGATIVE 10/05/2020 2030   HGBUR NEGATIVE 10/05/2020 2030   BILIRUBINUR NEGATIVE 10/05/2020 2030   BILIRUBINUR negative 12/24/2017 0857   KETONESUR NEGATIVE 10/05/2020 2030   PROTEINUR NEGATIVE 10/05/2020 2030   UROBILINOGEN 0.2 10/04/2020 1413   NITRITE NEGATIVE 10/05/2020 2030   LEUKOCYTESUR NEGATIVE 10/05/2020 2030    Radiological Exams on Admission: CT ABDOMEN PELVIS WO CONTRAST  Result Date: 10/05/2020 CLINICAL DATA:  Abdomen pain EXAM: CT ABDOMEN AND PELVIS WITHOUT CONTRAST TECHNIQUE: Multidetector CT imaging of the abdomen and pelvis was performed following the standard protocol without IV contrast. COMPARISON:  11/01/2015 CT, MRI 02/10/2017 FINDINGS: Lower chest: Lung bases demonstrate streaky atelectasis in the lower lobes and right middle lobe. No significant pleural effusion. Normal cardiac size. Hepatobiliary: No focal hepatic abnormality. Status post cholecystectomy. No biliary dilatation. Pancreas: Unremarkable. No pancreatic ductal dilatation or surrounding inflammatory changes. Spleen: Normal in size without focal abnormality. Adrenals/Urinary Tract: Adrenal glands are within normal limits. Kidneys show no hydronephrosis. Dilated urinary bladder with wall thickening. Focal wall thickening and calcification or surgical change along the superolateral aspect of the left bladder similar as compared with 2017 exam. Stomach/Bowel: The stomach is nonenlarged. No dilated small bowel. No acute bowel wall thickening Vascular/Lymphatic:  Nonaneurysmal aorta.  No suspicious nodes Reproductive: Status post hysterectomy.  No adnexal masses. Other: No free air. Moderate to large volume of ascites within the abdomen and pelvis. Stranding or mild infiltrated appearance of the omentum and upper abdominal mesentery. Musculoskeletal: No acute or suspicious osseous abnormality. IMPRESSION: 1. Moderate to large volume of ascites within the abdomen and pelvis of uncertain source. Hazy appearance of the omentum and upper abdominal mesentery, uncertain if this is due to edema or peritoneal disease. Consider correlation with diagnostic paracentesis. 2. Diffuse wall thickening of the bladder with focal wall thickening and calcification or postsurgical change along the left superolateral bladder as before. Electronically Signed   By: Jasmine Pang M.D.   On: 10/05/2020 21:03      Assessment/Plan Abdominal ascites -unclear origin, pt with normal LFTs, only occasional ETOH use. However she has hx of endometriosis requiring hystectomy -obtain diagnostic paracentesis with fluid studies  -PRN morphine pain control   AKI Cr of 2.85 from a prior of 0.88 Check bladder scan for post-obstructive cause due to symptom of hesitancy although likely prerenal due to decreased p.o. intake -Continuous IV fluids -Follow with repeat labs in the morning  GERD -continue PPI and famotidine  Moderate persistent asthma -Not in exacerbation.  Continue PRN albuterol  Obesity -BMI > 36. Complicates clinical course  DVT prophylaxis: SCDs Code Status: Full Family Communication: Plan discussed with patient at bedside  disposition Plan: Home with observation Consults called:  Admission status: Observation    Level of care: Med-Surg  Status is: Observation  The patient remains OBS appropriate and will d/c before 2 midnights.  Dispo: The patient is from: Home              Anticipated d/c is to: Home              Patient currently is not medically stable to d/c.   Difficult to place patient No         Anselm Jungling DO [facility]   If 7PM-7AM, please contact night-coverage [email or web address]   10/05/2020, 11:39 PM

## 2020-10-05 NOTE — ED Notes (Signed)
Patient transported to CT 

## 2020-10-06 ENCOUNTER — Encounter: Payer: 59 | Admitting: Physical Medicine and Rehabilitation

## 2020-10-06 ENCOUNTER — Inpatient Hospital Stay (HOSPITAL_COMMUNITY): Payer: 59

## 2020-10-06 ENCOUNTER — Encounter: Payer: Self-pay | Admitting: Physical Medicine and Rehabilitation

## 2020-10-06 ENCOUNTER — Observation Stay (HOSPITAL_COMMUNITY): Payer: 59

## 2020-10-06 DIAGNOSIS — K219 Gastro-esophageal reflux disease without esophagitis: Secondary | ICD-10-CM | POA: Diagnosis present

## 2020-10-06 DIAGNOSIS — Z833 Family history of diabetes mellitus: Secondary | ICD-10-CM | POA: Diagnosis not present

## 2020-10-06 DIAGNOSIS — Z91013 Allergy to seafood: Secondary | ICD-10-CM | POA: Diagnosis not present

## 2020-10-06 DIAGNOSIS — Z8249 Family history of ischemic heart disease and other diseases of the circulatory system: Secondary | ICD-10-CM | POA: Diagnosis not present

## 2020-10-06 DIAGNOSIS — E785 Hyperlipidemia, unspecified: Secondary | ICD-10-CM | POA: Diagnosis present

## 2020-10-06 DIAGNOSIS — M810 Age-related osteoporosis without current pathological fracture: Secondary | ICD-10-CM | POA: Diagnosis present

## 2020-10-06 DIAGNOSIS — N3289 Other specified disorders of bladder: Secondary | ICD-10-CM | POA: Diagnosis present

## 2020-10-06 DIAGNOSIS — Z20822 Contact with and (suspected) exposure to covid-19: Secondary | ICD-10-CM | POA: Diagnosis present

## 2020-10-06 DIAGNOSIS — N179 Acute kidney failure, unspecified: Secondary | ICD-10-CM | POA: Diagnosis present

## 2020-10-06 DIAGNOSIS — Z83438 Family history of other disorder of lipoprotein metabolism and other lipidemia: Secondary | ICD-10-CM | POA: Diagnosis not present

## 2020-10-06 DIAGNOSIS — E669 Obesity, unspecified: Secondary | ICD-10-CM | POA: Diagnosis present

## 2020-10-06 DIAGNOSIS — Z79899 Other long term (current) drug therapy: Secondary | ICD-10-CM | POA: Diagnosis not present

## 2020-10-06 DIAGNOSIS — Z823 Family history of stroke: Secondary | ICD-10-CM | POA: Diagnosis not present

## 2020-10-06 DIAGNOSIS — M549 Dorsalgia, unspecified: Secondary | ICD-10-CM | POA: Diagnosis present

## 2020-10-06 DIAGNOSIS — Z886 Allergy status to analgesic agent status: Secondary | ICD-10-CM | POA: Diagnosis not present

## 2020-10-06 DIAGNOSIS — E872 Acidosis: Secondary | ICD-10-CM | POA: Diagnosis present

## 2020-10-06 DIAGNOSIS — Z825 Family history of asthma and other chronic lower respiratory diseases: Secondary | ICD-10-CM | POA: Diagnosis not present

## 2020-10-06 DIAGNOSIS — J4540 Moderate persistent asthma, uncomplicated: Secondary | ICD-10-CM | POA: Diagnosis present

## 2020-10-06 DIAGNOSIS — Z888 Allergy status to other drugs, medicaments and biological substances status: Secondary | ICD-10-CM | POA: Diagnosis not present

## 2020-10-06 DIAGNOSIS — Z8349 Family history of other endocrine, nutritional and metabolic diseases: Secondary | ICD-10-CM | POA: Diagnosis not present

## 2020-10-06 DIAGNOSIS — Z6836 Body mass index (BMI) 36.0-36.9, adult: Secondary | ICD-10-CM | POA: Diagnosis not present

## 2020-10-06 DIAGNOSIS — K760 Fatty (change of) liver, not elsewhere classified: Secondary | ICD-10-CM | POA: Diagnosis present

## 2020-10-06 DIAGNOSIS — R3911 Hesitancy of micturition: Secondary | ICD-10-CM | POA: Diagnosis present

## 2020-10-06 DIAGNOSIS — M1990 Unspecified osteoarthritis, unspecified site: Secondary | ICD-10-CM | POA: Diagnosis present

## 2020-10-06 DIAGNOSIS — R188 Other ascites: Secondary | ICD-10-CM | POA: Diagnosis present

## 2020-10-06 HISTORY — DX: Acute kidney failure, unspecified: N17.9

## 2020-10-06 LAB — BASIC METABOLIC PANEL
Anion gap: 13 (ref 5–15)
Anion gap: 10 (ref 5–15)
BUN: 26 mg/dL — ABNORMAL HIGH (ref 6–20)
BUN: 25 mg/dL — ABNORMAL HIGH (ref 6–20)
CO2: 17 mmol/L — ABNORMAL LOW (ref 22–32)
CO2: 22 mmol/L (ref 22–32)
Calcium: 8.6 mg/dL — ABNORMAL LOW (ref 8.9–10.3)
Calcium: 8.7 mg/dL — ABNORMAL LOW (ref 8.9–10.3)
Chloride: 102 mmol/L (ref 98–111)
Chloride: 102 mmol/L (ref 98–111)
Creatinine, Ser: 3.14 mg/dL — ABNORMAL HIGH (ref 0.44–1.00)
Creatinine, Ser: 1.98 mg/dL — ABNORMAL HIGH (ref 0.44–1.00)
GFR, Estimated: 18 mL/min — ABNORMAL LOW (ref >60)
GFR, Estimated: 31 mL/min — ABNORMAL LOW (ref >60)
Glucose, Bld: 75 mg/dL (ref 70–99)
Glucose, Bld: 66 mg/dL — ABNORMAL LOW (ref 70–99)
Potassium: 4.3 mmol/L (ref 3.5–5.1)
Potassium: 4.2 mmol/L (ref 3.5–5.1)
Sodium: 132 mmol/L — ABNORMAL LOW (ref 135–145)
Sodium: 134 mmol/L — ABNORMAL LOW (ref 135–145)

## 2020-10-06 LAB — LACTATE DEHYDROGENASE, PLEURAL OR PERITONEAL FLUID: LD, Fluid: 26 U/L — ABNORMAL HIGH (ref 3–23)

## 2020-10-06 LAB — GLUCOSE, PLEURAL OR PERITONEAL FLUID: Glucose, Fluid: 71 mg/dL

## 2020-10-06 LAB — PROTIME-INR
INR: 0.9 (ref 0.8–1.2)
Prothrombin Time: 12.2 seconds (ref 11.4–15.2)

## 2020-10-06 LAB — ALBUMIN, PLEURAL OR PERITONEAL FLUID: Albumin, Fluid: <1 g/dL

## 2020-10-06 LAB — SARS CORONAVIRUS 2 (TAT 6-24 HRS): SARS Coronavirus 2: NEGATIVE

## 2020-10-06 LAB — BODY FLUID CELL COUNT WITH DIFFERENTIAL
Eos, Fluid: 0 %
Lymphs, Fluid: 5 %
Monocyte-Macrophage-Serous Fluid: 86 % (ref 50–90)
Neutrophil Count, Fluid: 9 % (ref 0–25)
Total Nucleated Cell Count, Fluid: 185 cu mm (ref 0–1000)

## 2020-10-06 LAB — PROTEIN / CREATININE RATIO, URINE
Creatinine, Urine: 69.21 mg/dL
Total Protein, Urine: <6 mg/dL

## 2020-10-06 LAB — CBC
HCT: 38.6 % (ref 36.0–46.0)
Hemoglobin: 12.3 g/dL (ref 12.0–15.0)
MCH: 26.1 pg (ref 26.0–34.0)
MCHC: 31.9 g/dL (ref 30.0–36.0)
MCV: 82 fL (ref 80.0–100.0)
Platelets: 217 10*3/uL (ref 150–400)
RBC: 4.71 MIL/uL (ref 3.87–5.11)
RDW: 13.1 % (ref 11.5–15.5)
WBC: 6.2 10*3/uL (ref 4.0–10.5)
nRBC: 0 % (ref 0.0–0.2)

## 2020-10-06 LAB — CREATININE, URINE, RANDOM: Creatinine, Urine: 71.77 mg/dL

## 2020-10-06 LAB — HIV ANTIBODY (ROUTINE TESTING W REFLEX): HIV Screen 4th Generation wRfx: NONREACTIVE

## 2020-10-06 LAB — PROTEIN, PLEURAL OR PERITONEAL FLUID: Total protein, fluid: <3 g/dL

## 2020-10-06 LAB — HEPATITIS PANEL, ACUTE
HCV Ab: NONREACTIVE
Hep A IgM: NONREACTIVE
Hep B C IgM: NONREACTIVE
Hepatitis B Surface Ag: NONREACTIVE

## 2020-10-06 LAB — SODIUM, URINE, RANDOM: Sodium, Ur: 17 mmol/L

## 2020-10-06 MED ORDER — LACTATED RINGERS IV SOLN: INTRAVENOUS | Status: AC

## 2020-10-06 MED ORDER — LIDOCAINE HCL 1 % IJ SOLN
INTRAMUSCULAR | Status: AC
  Filled 2020-10-06: qty 20

## 2020-10-06 MED ORDER — FENTANYL CITRATE (PF) 100 MCG/2ML IJ SOLN
25.0000 ug | INTRAMUSCULAR | Status: DC | PRN
  Administered 2020-10-06 – 2020-10-07 (×6): 50 ug via INTRAVENOUS
  Filled 2020-10-06 (×7): qty 2

## 2020-10-06 MED ORDER — SODIUM CHLORIDE 0.9 % IV SOLN
INTRAVENOUS | Status: AC
  Filled 2020-10-06: qty 250

## 2020-10-06 MED ORDER — CHLORHEXIDINE GLUCONATE CLOTH 2 % EX PADS
6.0000 | MEDICATED_PAD | Freq: Every day | CUTANEOUS | Status: DC
  Administered 2020-10-06 – 2020-10-09 (×4): 6 via TOPICAL

## 2020-10-06 MED ORDER — IOHEXOL 300 MG/ML  SOLN
30.0000 mL | Freq: Once | INTRAMUSCULAR | Status: AC | PRN
  Administered 2020-10-06: 30 mL

## 2020-10-06 NOTE — Progress Notes (Deleted)
[name] - 45 y.o. female MRN [id number]  Date of birth: 07/16/1975  Office Visit Note: Visit Date: 10/06/2020 PCP: [name], FNP Referred by: [name], FNP  Subjective: No chief complaint on file.  HPI:  [name] is a 45 y.o. female who comes in today at the request of Dr. Glee Arvin for electrodiagnostic study of the Right upper extremities.  Patient is Right hand dominant.   ROS Otherwise per HPI.  Assessment & Plan: Visit Diagnoses:    ICD-10-CM   1. Paresthesia of skin  R20.2     Plan: No additional findings.   Meds & Orders: No orders of the defined types were placed in this encounter.  No orders of the defined types were placed in this encounter.   Follow-up: No follow-ups on file.   Procedures: No procedures performed      Clinical History: No specialty comments available.     Objective:  VS:  HT:    WT:   BMI:     BP:   HR: bpm  TEMP: ( )  RESP:  Physical Exam Musculoskeletal:        General: No swelling, tenderness or deformity.     Comments: Inspection reveals no atrophy of the bilateral APB or FDI or hand intrinsics. There is no swelling, color changes, allodynia or dystrophic changes. There is 5 out of 5 strength in the bilateral wrist extension, finger abduction and long finger flexion. There is intact sensation to light touch in all dermatomal and peripheral nerve distributions. There is a negative Froment's test bilaterally. There is a negative Tinel's test at the bilateral wrist and elbow. There is a negative Phalen's test bilaterally. There is a negative Hoffmann's test bilaterally.  Skin:    General: Skin is warm and dry.     Findings: No erythema or rash.  Neurological:     General: No focal deficit present.     Mental Status: She is alert and oriented to person, place, and time.     Motor: No weakness or abnormal muscle tone.     Coordination: Coordination normal.  Psychiatric:        Mood and Affect: Mood  normal.        Behavior: Behavior normal.      Imaging: CT ABDOMEN PELVIS WO CONTRAST  Result Date: 10/05/2020 CLINICAL DATA:  Abdomen pain EXAM: CT ABDOMEN AND PELVIS WITHOUT CONTRAST TECHNIQUE: Multidetector CT imaging of the abdomen and pelvis was performed following the standard protocol without IV contrast. COMPARISON:  11/01/2015 CT, MRI 02/10/2017 FINDINGS: Lower chest: Lung bases demonstrate streaky atelectasis in the lower lobes and right middle lobe. No significant pleural effusion. Normal cardiac size. Hepatobiliary: No focal hepatic abnormality. Status post cholecystectomy. No biliary dilatation. Pancreas: Unremarkable. No pancreatic ductal dilatation or surrounding inflammatory changes. Spleen: Normal in size without focal abnormality. Adrenals/Urinary Tract: Adrenal glands are within normal limits. Kidneys show no hydronephrosis. Dilated urinary bladder with wall thickening. Focal wall thickening and calcification or surgical change along the superolateral aspect of the left bladder similar as compared with 2017 exam. Stomach/Bowel: The stomach is nonenlarged. No dilated small bowel. No acute bowel wall thickening Vascular/Lymphatic: Nonaneurysmal aorta.  No suspicious nodes Reproductive: Status post hysterectomy. No adnexal masses. Other: No free air. Moderate to large volume of ascites within the abdomen and pelvis. Stranding or mild infiltrated appearance of the omentum and upper abdominal mesentery. Musculoskeletal: No acute or suspicious osseous abnormality. IMPRESSION: 1. Moderate to large volume of  ascites within the abdomen and pelvis of uncertain source. Hazy appearance of the omentum and upper abdominal mesentery, uncertain if this is due to edema or peritoneal disease. Consider correlation with diagnostic paracentesis. 2. Diffuse wall thickening of the bladder with focal wall thickening and calcification or postsurgical change along the left superolateral bladder as before.  Electronically Signed   By: Jasmine Pang M.D.   On: 10/05/2020 21:03

## 2020-10-06 NOTE — Plan of Care (Signed)
  Problem: Education: Goal: Knowledge of General Education information will improve Description: Including pain rating scale, medication(s)/side effects and non-pharmacologic comfort measures Outcome: Progressing   Problem: Activity: Goal: Risk for activity intolerance will decrease Outcome: Progressing   Problem: Pain Managment: Goal: General experience of comfort will improve Outcome: Progressing   

## 2020-10-06 NOTE — Plan of Care (Signed)
  Problem: Clinical Measurements: Goal: Respiratory complications will improve Outcome: Progressing   Problem: Clinical Measurements: Goal: Cardiovascular complication will be avoided Outcome: Progressing   Problem: Elimination: Goal: Will not experience complications related to bowel motility Outcome: Progressing   Problem: Skin Integrity: Goal: Risk for impaired skin integrity will decrease Outcome: Progressing   

## 2020-10-06 NOTE — ED Notes (Signed)
Patient has been reminded 3 times on the importance of using her call bell moving and getting up out bed. Patient was found again at the foot of the bed.

## 2020-10-06 NOTE — Progress Notes (Signed)
On call provider notified of need for additional pain medication due to pt pain on arrival to floor. Rn awaiting call back.

## 2020-10-06 NOTE — Progress Notes (Signed)
Pt stable on arrival to floor. No s/s of distress on arrival to floor. Assessment and Vs performed. Rn will continue to monitor.

## 2020-10-06 NOTE — Consult Note (Signed)
I have been asked to see the patient by Dr. Lacretia Nicks, for evaluation and management of concern for bladder rupture.  History of present illness: 45 year old [name] who presented to the emergency department with difficulty urinating, dysuria and pain, and crampy abdominal pain.  She was having significant pelvic pain and difficulty walking.  The patient was not having any flank pain.  She was not having any fevers or chills.  She is not having any constipation.  She has a baseline history of overactive bladder and urinary urgency.  She is seen and treated by Dr. Lorin Picket Mcdiarmid in the urology clinic.  The patient has a history of cystotomy during a gynecological surgery.  This required closure of the cystotomy by Dr. Mena Goes.  This was in 2012.  She did well following this surgery.  Her catheter was removed in the clinic.  She has not really had any sequelae or ongoing issues.  She does have a baseline of significant overactive bladder.  She does not have a history of recurrent urinary tract infections.  Today in the hospital the patient had a paracentesis and 250 mL was drained off the abdomen.  She subsequently underwent a CT cystogram which showed a wisp of contrast extravasating into the extraperitoneal which is concerning for an extraperitoneal bladder rupture.  Review of systems: A 12 point comprehensive review of systems was obtained and is negative unless otherwise stated in the history of present illness.  Patient Active Problem List   Diagnosis Date Noted  . AKI (acute kidney injury) (HCC) 10/06/2020  . Ascites 10/05/2020  . Other insomnia 07/27/2020  . At risk for side effect of medication 07/27/2020  . Other hyperlipidemia 05/15/2020  . At risk for osteoporosis 05/15/2020  . At risk for impaired metabolic function 04/04/2020  . Other fatigue 03/22/2020  . SOBOE (shortness of breath on exertion) 03/22/2020  . Exertional dyspnea 03/14/2020  . Neuropathy, right side of face  03/08/2020  . At risk for depression 03/08/2020  . Primary osteoarthritis of both feet 02/26/2020  . Vitamin D deficiency, unspecified 01/26/2020  . Hyperlipidemia 01/26/2020  . Prediabetes 01/26/2020  . At risk for heart disease 01/26/2020  . Daytime somnolence 01/26/2020  . Vitamin D deficiency 01/04/2020  . Fatty liver 01/04/2020  . B12 deficiency 01/04/2020  . Moderate persistent asthma without complication 12/31/2019  . Inappropriate sinus tachycardia 12/08/2019  . Class 3 severe obesity with serious comorbidity and body mass index (BMI) of 40.0 to 44.9 in adult (HCC) 12/08/2019  . Apnea 12/08/2019  . Anaphylactic reaction due to food, subsequent encounter 11/22/2019  . Allergic rhinoconjunctivitis 11/22/2019  . Idiopathic urticaria 11/22/2019  . Generalized anxiety disorder 04/08/2018  . Atypical chest pain   . Regurgitation of food   . Laryngopharyngeal reflux (LPR) 07/30/2017  . Throat soreness 07/30/2017  . ANA positive 12/30/2016  . Gastroesophageal reflux disease 06/27/2016  . Disturbance of skin sensation 04/27/2014  . Dizziness and giddiness 03/14/2014  . Headache, migraine 03/14/2014  . Myalgia and myositis, unspecified 06/07/2013  . Nausea and vomiting in adult 08/22/2012  . Viral syndrome 08/22/2012  . Pelvic pain in [name] 12/18/2010  . PID (acute pelvic inflammatory disease) 12/18/2010  . ANEMIA-IRON DEFICIENCY 10/15/2006  . HERPES, GENITAL NOS 09/30/2006  . Obesity (BMI 35.0-39.9 without comorbidity) 09/30/2006  . DISORDER, NONORGANIC SLEEP NOS 09/30/2006  . CONSTIPATION NOS 09/30/2006  . NEURITIS, LUMBOSACRAL NOS 09/30/2006  . HX, PERSONAL, MENTAL DISORDER NOS 09/30/2006    No current facility-administered medications on file  prior to encounter.   Current Outpatient Medications on File Prior to Encounter  Medication Sig Dispense Refill  . acetaminophen (TYLENOL) 500 MG tablet Take 1,000 mg by mouth every 6 (six) hours as needed for moderate pain.     [name] albuterol (VENTOLIN HFA) 108 (90 Base) MCG/ACT inhaler Inhale 2 puffs into the lungs every 6 (six) hours as needed for wheezing or shortness of breath. 1 each 11  . calcium carbonate (OS-CAL - DOSED IN MG OF ELEMENTAL CALCIUM) 1250 (500 Ca) MG tablet Take 1 tablet by mouth.    . cetirizine (ZYRTEC) 10 MG tablet TAKE 1 TABLET (10 MG TOTAL) BY MOUTH DAILY AS NEEDED FOR RHINITIS. 90 tablet 1  . clindamycin (CLEOCIN T) 1 % lotion Apply 1 application topically daily.    [name] EPINEPHrine (EPIPEN 2-PAK) 0.3 mg/0.3 mL IJ SOAJ injection Inject 0.3 mg into the muscle as needed for anaphylaxis. (Patient taking differently: Inject 0.3 mg into the muscle once as needed for anaphylaxis.) 2 each 2  . esomeprazole (NEXIUM) 40 MG capsule TAKE 1 CAPSULE (40 MG TOTAL) BY MOUTH DAILY AT 12 NOON. 90 capsule 2  . famotidine (PEPCID) 20 MG tablet Take 20 mg by mouth daily.    . fluticasone furoate-vilanterol (BREO ELLIPTA) 100-25 MCG/INH AEPB Inhale 1 puff into the lungs daily as needed.    [name] HYDROcodone-acetaminophen (NORCO/VICODIN) 5-325 MG tablet Take 1 tablet by mouth every 6 (six) hours as needed for severe pain.    . naproxen (NAPROSYN) 500 MG tablet Take 1 tablet (500 mg total) by mouth 2 (two) times daily. (Patient taking differently: Take 500 mg by mouth 2 (two) times daily with a meal.) 14 tablet 0  . ondansetron (ZOFRAN) 4 MG tablet Take 1 tablet (4 mg total) by mouth daily as needed for nausea or vomiting. 30 tablet 0  . RESTASIS 0.05 % ophthalmic emulsion Place 1 drop into both eyes 2 (two) times daily.    . sucralfate (CARAFATE) 1 g tablet TAKE 1 TABLET (1 G TOTAL) BY MOUTH EVERY 6 (SIX) HOURS. SLOWLY DISSOLVE TABLET IN 1 TABLESPOON OF DISTILLED WATER BEFORE INGESTION 90 tablet 2  . Vitamin D, Ergocalciferol, (DRISDOL) 1.25 MG (50000 UNIT) CAPS capsule 1 po q 2 wks (Patient taking differently: Take 50,000 Units by mouth every 7 (seven) days.) 2 capsule 0  . zolpidem (AMBIEN CR) 12.5 MG CR tablet Take 12.5 mg  by mouth at bedtime as needed for sleep.    [name] tiZANidine (ZANAFLEX) 4 MG tablet Take 1 tablet (4 mg total) by mouth every 6 (six) hours as needed for muscle spasms. 30 tablet 0    Past Medical History:  Diagnosis Date  . Allergy   . Anemia   . Angio-edema   . Anxiety   . Apnea 12/08/2019  . Arthritis   . Asthma   . Back pain   . Constipation   . Exertional dyspnea 03/14/2020  . Fatty liver   . GERD (gastroesophageal reflux disease)   . H/O: hysterectomy   . Hx of endometriosis   . Inappropriate sinus tachycardia 12/08/2019  . Insomnia   . Joint pain   . Lactose intolerance   . Macromastia 07/2011  . Migraine   . Morbid obesity (HCC) 12/08/2019  . Osteoporosis   . Urticaria   . Vitamin D deficiency     Past Surgical History:  Procedure Laterality Date  . 24 HOUR PH STUDY N/A 01/12/2018   Procedure: 24 HOUR PH STUDY;  Surgeon: Marsa Aris  V, MD;  Location: [facility];  Service: Endoscopy;  Laterality: N/A;  . ABDOMINAL HYSTERECTOMY  12/12/2003  . ADENOIDECTOMY  05/2011  . ANKLE ARTHROSCOPY  12/29/2007   right; with extensive debridement  . BLADDER NECK RECONSTRUCTION  01/14/2011   Procedure: BLADDER NECK REPAIR;  Surgeon: Reva Boresanya S Pratt, MD;  Location: [facility];  Service: Gynecology;  Laterality: N/A;  Laparoscopic Repair of Incidental Cystotomy  . BREAST REDUCTION SURGERY  08/05/2011   Procedure: MAMMARY REDUCTION  (BREAST);  Surgeon: Louisa SecondGerald Truesdale, MD;  Location: [facility];  Service: Plastics;  Laterality: Bilateral;  bilateral  . BREAST SURGERY  2012   breast reduction  . CESAREAN SECTION  12/29/2000; 1994  . DILATION AND CURETTAGE OF UTERUS  07/08/2003   open laparoscopy  . ESOPHAGEAL MANOMETRY N/A 01/12/2018   Procedure: ESOPHAGEAL MANOMETRY (EM);  Surgeon: Napoleon FormNandigam, Kavitha V, MD;  Location: [facility];  Service: Endoscopy;  Laterality: N/A;  . GIVENS CAPSULE STUDY N/A 06/23/2017   Procedure: GIVENS CAPSULE STUDY;  Surgeon: Benancio DeedsArmbruster, Steven P, MD;   [facility];  Service: Gastroenterology;  Laterality: N/A;  . LAPAROSCOPIC SALPINGOOPHERECTOMY  01/14/2011   left  . PH IMPEDANCE STUDY N/A 01/12/2018   Procedure: PH IMPEDANCE STUDY;  Surgeon: Napoleon FormNandigam, Kavitha V, MD;  Location: [facility];  Service: Endoscopy;  Laterality: N/A;  . REPAIR PERONEAL TENDONS ANKLE  01/03/2010   repair right subluxing peroneal tendons  . RESECTION DISTAL CLAVICAL Right 09/25/2017   Procedure: RESECTION DISTAL CLAVICAL;  Surgeon: Bjorn PippinVarkey, Dax T, MD;  Location: [facility];  Service: Orthopedics;  Laterality: Right;  . RIGHT OOPHORECTOMY     with lysis of adhesions  . SHOULDER ACROMIOPLASTY Right 09/25/2017   Procedure: SHOULDER ACROMIOPLASTY;  Surgeon: Bjorn PippinVarkey, Dax T, MD;  Location: [facility];  Service: Orthopedics;  Laterality: Right;  . SHOULDER ARTHROSCOPY WITH DEBRIDEMENT AND BICEP TENDON REPAIR Right 09/25/2017   Procedure: SHOULDER ARTHROSCOPY WITH DEBRIDEMENT AND BICEP TENDON REPAIR;  Surgeon: Bjorn PippinVarkey, Dax T, MD;  Location: [facility];  Service: Orthopedics;  Laterality: Right;  . SHOULDER ARTHROSCOPY WITH ROTATOR CUFF REPAIR Right 09/25/2017   Procedure: SHOULDER ARTHROSCOPY WITH ROTATOR CUFF REPAIR;  Surgeon: Bjorn PippinVarkey, Dax T, MD;  Location: [facility];  Service: Orthopedics;  Laterality: Right;  . TONSILLECTOMY  as a child  . TUBAL LIGATION  12/29/2000  . UPPER GASTROINTESTINAL ENDOSCOPY      Social History   Tobacco Use  . Smoking status: Never Smoker  . Smokeless tobacco: Never Used  Vaping Use  . Vaping Use: Never used  Substance Use Topics  . Alcohol use: Yes    Comment: occasional   . Drug use: No    Family History  Problem Relation Age of Onset  . Diabetes Mother   . Hypertension Mother   . Hyperlipidemia Mother   . Sleep apnea Mother   . Thyroid disease Sister   . Dementia Father   . Heart attack Maternal Grandmother   . Stroke Maternal Grandfather   . Asthma Son   .  Healthy Son   . Asthma Daughter   . Healthy Daughter   . Colon cancer Neg Hx   . Colon polyps Neg Hx   . Esophageal cancer Neg Hx   . Rectal cancer Neg Hx   . Stomach cancer Neg Hx     PE: Vitals:   10/06/20 1103 10/06/20 1310 10/06/20 1618 10/06/20 2215  BP: (!) 142/105 (!) 152/Sheila (!) 134/95 134/83  Pulse: 88 84 96 79  Resp: 16 19 19 17   Temp:  98.7 F (37.1 C) 99 F (37.2 C) 98.4 F (36.9 C)  TempSrc:  Oral Oral Oral  SpO2: 100% 94% 98% 100%  Weight:      Height:       Patient appears to be in no acute distress  patient is alert and oriented x3 Atraumatic normocephalic head No cervical or supraclavicular lymphadenopathy appreciated No increased work of breathing, no audible wheezes/rhonchi Regular sinus rhythm/rate Abdomen is soft, diffusely tender. Foley is draining clear urine. Lower extremities are symmetric without appreciable edema Grossly neurologically intact No identifiable skin lesions  Recent Labs    10/05/20 1855 10/05/20 1944 10/05/20 2159 10/06/20 0506  WBC 6.5  --  6.4 6.2  HGB 12.4 13.6 12.1 12.3  HCT 38.8 40.0 37.1 38.6   Recent Labs    10/05/20 2159 10/06/20 0506 10/06/20 2017  NA 134* 132* 134*  K 4.1 4.3 4.2  CL 102 102 102  CO2 23 17* 22  GLUCOSE 86 75 66*  BUN 23* 26* 25*  CREATININE 2.85* 3.14* 1.98*  CALCIUM 8.8* 8.6* 8.7*   Recent Labs    10/06/20 0907  INR 0.9   No results for input(s): LABURIN in the last 72 hours. Results for orders placed or performed during the hospital encounter of 10/05/20  SARS CORONAVIRUS 2 (TAT 6-24 HRS) Nasopharyngeal Nasopharyngeal Swab     Status: None   Collection Time: 10/05/20  9:59 PM   Specimen: Nasopharyngeal Swab  Result Value Ref Range Status   SARS Coronavirus 2 NEGATIVE NEGATIVE Final    Comment: (NOTE) SARS-CoV-2 target nucleic acids are NOT DETECTED.  The SARS-CoV-2 RNA is generally detectable in upper and lower respiratory specimens during the acute phase of infection.  Negative results do not preclude SARS-CoV-2 infection, do not rule out co-infections with other pathogens, and should not be used as the sole basis for treatment or other patient management decisions. Negative results must be combined with clinical observations, patient history, and epidemiological information. The expected result is Negative.  Fact Sheet for Patients: 12/05/20  Fact Sheet for Healthcare Providers: HairSlick.no  This test is not yet approved or cleared by the [employer]ier[employer]ir[employer] FDA and  has been authorized for detection and/or diagnosis of SARS-CoV-2 by FDA under an Emergency Use Authorization (EUA). This EUA will remain  in effect (meaning this test can be used) for the duration of the COVID-19 declaration under Se ction 564(b)(1) of the Act, 21 U.S.C. section 360bbb-3(b)(1), unless the authorization is terminated or revoked sooner.  Performed at [facility], [address], [place], [place]     Imaging: I reviewed the patient's CT scan with contrast, cystogram, with the patient.  This demonstrates an area on the left posterior bladder wall that is consistent with a bladder perforation.  The urine appears to be extraperitoneal for the most part.  Imp/Recommendations: The patient appears to have an extraperitoneal bladder rupture, although she does have quite significant urinoma that extends into the perineum.  It is unclear whether it extends up into this area because of her previous surgeries and violation of this plane or if the perforation extends into the intraperitoneal space as well.  I think trying to manage this conservatively with a Foley catheter for at least the next 2 weeks is worth trying.  I went through this with the patient and she has also agreed to go slow with this and delay  surgery if at all possible.  In addition, I think the patient would benefit quite significantly from  draining the urine out of her pelvis.  It may be reasonable to place a pigtail in the area and take out once it stops draining.  I will  defer this to interventional radiology.  I will continue to follow along.    Crist Fat

## 2020-10-06 NOTE — ED Notes (Signed)
Pt bladder scan >293

## 2020-10-06 NOTE — ED Notes (Signed)
Ambulated to the bathroom 

## 2020-10-06 NOTE — Procedures (Signed)
Ultrasound-guided diagnostic  paracentesis performed yielding 210 cc of very light yellow(essentially clear, colorless) fluid. No immediate complications.The fluid was sent to the lab for preordered studies. EBL none.

## 2020-10-06 NOTE — Progress Notes (Addendum)
PROGRESS NOTE    [name]  DJM:[id number] DOB: 06-21-1975 DOA: 10/05/2020 PCP: [name], FNP   Chief Complaint  Patient presents with  . Abdominal Pain    Brief Narrative:  [name] is [name] 45 y.o. female with medical history significant for obesity, moderate persistent asthma, anemia and GERD who presents with persistent abdominal pain.  She reports having diffuse cramping abdominal pain for the past week as well as dysuria and some urinary hesitancy.  Pain has been constant and worse with ambulation and eating.  Has had decreased p.o. intake and nausea but no vomiting.  Stool has been loose but no diarrhea.  No fevers.  Patient has history of 2 C-section, hysterectomy and cholecystectomy.  Has only occasional alcohol use.  No recent weight loss. No family hx of abdominal cancers or disease on mother's side. Does not know her father.   She presented with the symptoms to urgent care today and had UA showing showing hemoglobin and was asked to present to ED for worsening symptoms.  ED Course: She was hypertensive up to systolic of 150s.  CBC was negative.  Creatinine of 2.85 from prior of 0.88.  LFTs are normal. CT Abdomen shows moderate to large volume ascites within the abdomen and pelvis of uncertain origin.  Bedside ultrasound performed by ED Dr. Myrtis Ser but paracentesis not attempted due to intestines being close to abdominal wall.  Assessment & Plan:   Principal Problem:   Ascites Active Problems:   Obesity (BMI 35.0-39.9 without comorbidity)   Gastroesophageal reflux disease   AKI (acute kidney injury) (HCC)  Abdominal ascites Hx LSO with incidental cystotomy with repair in 01/2011, concern for uroperitoneum No abnormal LFTs, normal bili, normal platelets, normal INR.  No decreased synthetic function of liver to explain ascites.  AKI, but urine is bland, suggesting against nephrotic syndrome, appears relatively euvolemic as well C/o abdominal discomfort with  urination, hx bladder injury in past -> concerning for uroperitoneum S/p paracentesis by IR -> SAAG>1.1, 185 nucleated cells not c/w SBP.  Follow pending pathologist smear review, acid fast smear and culture, pH body fluid, aerobic/anaerobic culture, fungal culture, cytology.  LDH mildly elevated.  Glucose 71.  Follow creatinine.   Follow RUQ [place] (order changed to ascites search for some reason, will reorder) CT with moderate to large volume ascites within abdomen and pelvis, hazy appearance of omentum and upper abdominal mesentery (edema vs peritoneal disease).  Diffuse wall thickening of bladder with focal wall thickening and calcification or postsurgical change along the left superolateral bladder as before Discussed with urology, planning CT cystogram, foley placement Urology c/s, appreciate assistance  Acute Kidney Injury  Urinary Retention  NAGMA Bladder scan with 400 cc retained Foley now in place No hydro on CT Suspect 2/2 above UA bland FeNa suggests prerenal etiology Follow with IVF and treatment of above  GERD -continue PPI and famotidine  Moderate persistent asthma -Not in exacerbation.  Continue PRN albuterol  Obesity -BMI > 36. Complicates clinical course  Positive ANA Follows with rheum outpatient (06/21/2020 note from Dr. Corliss Skains)  DVT prophylaxis: SCD Code Status: full  Family Communication: none at bedside Disposition:   Status is: Inpatient  Remains inpatient appropriate because:Inpatient level of care appropriate due to severity of illness   Dispo: The patient is from: Home              Anticipated d/c is to: Home              Patient  currently is not medically stable to d/c.   Difficult to place patient No       Consultants:   urology  Procedures:  none  Antimicrobials: = Anti-infectives (From admission, onward)   None         Subjective: C/o abdominal discomfort and swelling, for several days  Objective: Vitals:   10/06/20  0552 10/06/20 1103 10/06/20 1310 10/06/20 1618  BP: (!) 132/93 (!) 142/105 (!) 152/107 (!) 134/95  Pulse: 74 88 84 96  Resp: 16 16 19 19   Temp: 98.8 F (37.1 C)  98.7 F (37.1 C) 99 F (37.2 C)  TempSrc: Oral  Oral Oral  SpO2: 97% 100% 94% 98%  Weight:      Height:        Intake/Output Summary (Last 24 hours) at 10/06/2020 1738 Last data filed at 10/06/2020 1629 Gross per 24 hour  Intake 2263.52 ml  Output 1300 ml  Net 963.52 ml   Filed Weights   10/05/20 1840  Weight: 95.3 kg    Examination:  General exam: Appears calm and comfortable  Respiratory system: Clear to auscultation. Respiratory effort normal. Cardiovascular system: S1 & S2 heard, RRR Gastrointestinal system: Abdomen is protuberant, soft and nontender Central nervous system: Alert and oriented. No focal neurological deficits. Extremities: no LEE Skin: No rashes, lesions or ulcers Psychiatry: Judgement and insight appear normal. Mood & affect appropriate.     Data Reviewed: I have personally reviewed following labs and imaging studies  CBC: Recent Labs  [name] 10/05/20 1855 10/05/20 1944 10/05/20 2159 10/06/20 0506  WBC 6.5  --  6.4 6.2  HGB 12.4 13.6 12.1 12.3  HCT 38.8 40.0 37.1 38.6  MCV 80.8  --  80.0 82.0  PLT 330  --  306 217    Basic Metabolic Panel: Recent Labs  [name] 10/05/20 1855 10/05/20 1944 10/05/20 2159 10/06/20 0506  NA 134* 131* 134* 132*  K 4.9 6.9* 4.1 4.3  CL 98 100 102 102  CO2 25  --  23 17*  GLUCOSE 90 84 86 75  BUN 24* 35* 23* 26*  CREATININE 2.83* 3.10* 2.85* 3.14*  CALCIUM 9.2  --  8.8* 8.6*    GFR: Estimated Creatinine Clearance: 25.3 mL/min ([name]) (by C-G formula based on SCr of 3.14 mg/dL (H)).  Liver Function Tests: Recent Labs  [name] 10/05/20 1855 10/05/20 2159  AST 26 22  ALT 20 18  ALKPHOS 58 56  BILITOT 0.7 0.9  PROT 8.9* 8.0  ALBUMIN 4.9 4.6    CBG: Recent Labs  [name] 10/05/20 2013  GLUCAP 77     Recent Results (from the past 240 hour(s))   Urine culture     Status: Abnormal   Collection Time: 10/04/20  2:44 PM   Specimen: Urine, Random  Result Value Ref Range Status   Specimen Description URINE, RANDOM  Final   Special Requests NONE  Final   Culture ([name])  Final    <10,000 COLONIES/mL INSIGNIFICANT GROWTH Performed at [name], [address], [place], [place]    Report Status 10/05/2020 FINAL  Final  SARS CORONAVIRUS 2 (TAT 6-24 HRS) Nasopharyngeal Nasopharyngeal Swab     Status: None   Collection Time: 10/05/20  9:59 PM   Specimen: Nasopharyngeal Swab  Result Value Ref Range Status   SARS Coronavirus 2 NEGATIVE NEGATIVE Final    Comment: (NOTE) SARS-CoV-2 target nucleic acids are NOT DETECTED.  The SARS-CoV-2 RNA is generally detectable in upper and lower respiratory  specimens during the acute phase of infection. Negative results do not preclude SARS-CoV-2 infection, do not rule out co-infections with other pathogens, and should not be used as the sole basis for treatment or other patient management decisions. Negative results must be combined with clinical observations, patient history, and epidemiological information. The expected result is Negative.  Fact Sheet for Patients: HairSlick.nohttps://www.fda.gov/media/138098/download  Fact Sheet for Healthcare Providers: quierodirigir.comhttps://www.fda.gov/media/138095/download  This test is not yet approved or cleared by the [place] FDA and  has been authorized for detection and/or diagnosis of SARS-CoV-2 by FDA under an Emergency Use Authorization (EUA). This EUA will remain  in effect (meaning this test can be used) for the duration of the COVID-19 declaration under Se ction 564(b)(1) of the Act, 21 U.S.C. section 360bbb-3(b)(1), unless the authorization is terminated or revoked sooner.  Performed at [name], [address] [place], [place] [address]          Radiology Studies: CT ABDOMEN PELVIS WO CONTRAST  Result Date:  10/05/2020 CLINICAL DATA:  Abdomen pain EXAM: CT ABDOMEN AND PELVIS WITHOUT CONTRAST TECHNIQUE: Multidetector CT imaging of the abdomen and pelvis was performed following the standard protocol without IV contrast. COMPARISON:  11/01/2015 CT, MRI 02/10/2017 FINDINGS: Lower chest: Lung bases demonstrate streaky atelectasis in the lower lobes and right middle lobe. No significant pleural effusion. Normal cardiac size. Hepatobiliary: No focal hepatic abnormality. Status post cholecystectomy. No biliary dilatation. Pancreas: Unremarkable. No pancreatic ductal dilatation or surrounding inflammatory changes. Spleen: Normal in size without focal abnormality. Adrenals/Urinary Tract: Adrenal glands are within normal limits. Kidneys show no hydronephrosis. Dilated urinary bladder with wall thickening. Focal wall thickening and calcification or surgical change along the superolateral aspect of the left bladder similar as compared with 2017 exam. Stomach/Bowel: The stomach is nonenlarged. No dilated small bowel. No acute bowel wall thickening Vascular/Lymphatic: Nonaneurysmal aorta.  No suspicious nodes Reproductive: Status post hysterectomy. No adnexal masses. Other: No free air. Moderate to large volume of ascites within the abdomen and pelvis. Stranding or mild infiltrated appearance of the omentum and upper abdominal mesentery. Musculoskeletal: No acute or suspicious osseous abnormality. IMPRESSION: 1. Moderate to large volume of ascites within the abdomen and pelvis of uncertain source. Hazy appearance of the omentum and upper abdominal mesentery, uncertain if this is due to edema or peritoneal disease. Consider correlation with diagnostic paracentesis. 2. Diffuse wall thickening of the bladder with focal wall thickening and calcification or postsurgical change along the left superolateral bladder as before. Electronically Signed   By: Sheila PangKim  Fujinaga M.D.   On: 10/05/2020 21:03   US Abdomen Limited  Result Date:  10/06/2020 CLINICAL DATA:  Abdominal distension EXAM: LIMITED ABDOMEN ULTRASOUND FOR ASCITES TECHNIQUE: Limited ultrasound survey for ascites was performed in all four abdominal quadrants. COMPARISON:  None. FINDINGS: There is moderate ascites throughout the abdomen and pelvis. Loops of peristalsing bowel noted. IMPRESSION: Moderate ascites throughout abdomen and pelvis noted. Electronically Signed   By: Bretta BangWilliam  Woodruff III M.D.   On: 10/06/2020 15:10   US Paracentesis  Result Date: 10/06/2020 INDICATION: Patient with history of abdominal pain, dysuria, acute kidney injury, GERD, ascites. Request received for diagnostic paracentesis. EXAM: ULTRASOUND GUIDED DIAGNOSTIC PARACENTESIS MEDICATIONS: 1% lidocaine to skin and subcutaneous tissue COMPLICATIONS: None immediate. PROCEDURE: Informed written consent was obtained from the patient after Lasheika Ortloff discussion of the risks, benefits and alternatives to treatment. Shaylon Gillean timeout was performed prior to the initiation of the procedure. Initial ultrasound scanning demonstrates Chelcie Estorga moderate to large amount of  ascites within the left mid to lower abdominal quadrant. The left mid to lower abdomen was prepped and draped in the usual sterile fashion. 1% lidocaine was used for local anesthesia. Following this, Skylor Schnapp 19 gauge, 10-cm, Yueh catheter was introduced. An ultrasound image was saved for documentation purposes. The paracentesis was performed. The catheter was removed and Daivik Overley dressing was applied. The patient tolerated the procedure well without immediate post procedural complication. FINDINGS: Shela Esses total of approximately 210 cc of very light yellow (essentially clear, colorless) fluid was removed. Samples were sent to the laboratory as requested by the clinical team. IMPRESSION: Successful ultrasound-guided diagnostic paracentesis yielding 210 cc of peritoneal fluid. Read by: Jeananne Rama, PA-C Electronically Signed   By: Simonne Come M.D.   On: 10/06/2020 10:59   DG CHEST PORT 1  VIEW  Result Date: 10/06/2020 CLINICAL DATA:  Shortness of breath.  History of asthma. EXAM: PORTABLE CHEST 1 VIEW COMPARISON:  PA and lateral chest 12/05/2019. FINDINGS: There is streaky bibasilar atelectasis, greater on the right. No pneumothorax or pleural fluid. Heart size is normal. No acute or focal bony abnormality. IMPRESSION: Bibasilar subsegmental atelectasis, worse on the right. Electronically Signed   By: Drusilla Kanner M.D.   On: 10/06/2020 11:14        Scheduled Meds: . Chlorhexidine Gluconate Cloth  6 each Topical Daily  . famotidine  20 mg Oral Daily  . lidocaine      . pantoprazole  40 mg Oral Daily   Continuous Infusions: . lactated ringers 100 mL/hr at 10/06/20 1639     LOS: 0 days    Time spent: over 30 min    Lacretia Nicks, MD [facility]   To contact the attending provider between 7A-7P or the covering provider during after hours 7P-7A, please log into the web site [email or web address] and access using universal [place] password for that web site. If you do not have the password, please call the hospital operator.  10/06/2020, 5:38 PM

## 2020-10-06 NOTE — ED Notes (Signed)
Reapplied blood pressure cuff.

## 2020-10-07 ENCOUNTER — Inpatient Hospital Stay (HOSPITAL_COMMUNITY): Payer: 59

## 2020-10-07 LAB — CBC WITH DIFFERENTIAL/PLATELET
Abs Immature Granulocytes: 0.01 10*3/uL (ref 0.00–0.07)
Basophils Absolute: 0 10*3/uL (ref 0.0–0.1)
Basophils Relative: 0 %
Eosinophils Absolute: 0.1 10*3/uL (ref 0.0–0.5)
Eosinophils Relative: 1 %
HCT: 36.1 % (ref 36.0–46.0)
Hemoglobin: 11.5 g/dL — ABNORMAL LOW (ref 12.0–15.0)
Immature Granulocytes: 0 %
Lymphocytes Relative: 33 %
Lymphs Abs: 1.7 10*3/uL (ref 0.7–4.0)
MCH: 25.7 pg — ABNORMAL LOW (ref 26.0–34.0)
MCHC: 31.9 g/dL (ref 30.0–36.0)
MCV: 80.8 fL (ref 80.0–100.0)
Monocytes Absolute: 0.5 10*3/uL (ref 0.1–1.0)
Monocytes Relative: 9 %
Neutro Abs: 2.9 10*3/uL (ref 1.7–7.7)
Neutrophils Relative %: 57 %
Platelets: 297 10*3/uL (ref 150–400)
RBC: 4.47 MIL/uL (ref 3.87–5.11)
RDW: 13.1 % (ref 11.5–15.5)
WBC: 5.2 10*3/uL (ref 4.0–10.5)
nRBC: 0 % (ref 0.0–0.2)

## 2020-10-07 LAB — COMPREHENSIVE METABOLIC PANEL
ALT: 22 U/L (ref 0–44)
AST: 25 U/L (ref 15–41)
Albumin: 3.8 g/dL (ref 3.5–5.0)
Alkaline Phosphatase: 59 U/L (ref 38–126)
Anion gap: 9 (ref 5–15)
BUN: 15 mg/dL (ref 6–20)
CO2: 24 mmol/L (ref 22–32)
Calcium: 8.8 mg/dL — ABNORMAL LOW (ref 8.9–10.3)
Chloride: 106 mmol/L (ref 98–111)
Creatinine, Ser: 1.04 mg/dL — ABNORMAL HIGH (ref 0.44–1.00)
GFR, Estimated: >60 mL/min (ref >60)
Glucose, Bld: 86 mg/dL (ref 70–99)
Potassium: 4.1 mmol/L (ref 3.5–5.1)
Sodium: 139 mmol/L (ref 135–145)
Total Bilirubin: 0.6 mg/dL (ref 0.3–1.2)
Total Protein: 7.6 g/dL (ref 6.5–8.1)

## 2020-10-07 LAB — BODY FLUID CELL COUNT WITH DIFFERENTIAL
Eos, Fluid: 4 %
Lymphs, Fluid: 24 %
Monocyte-Macrophage-Serous Fluid: 59 % (ref 50–90)
Neutrophil Count, Fluid: 13 % (ref 0–25)
Total Nucleated Cell Count, Fluid: 344 cu mm (ref 0–1000)

## 2020-10-07 LAB — MAGNESIUM: Magnesium: 2 mg/dL (ref 1.7–2.4)

## 2020-10-07 LAB — CREATININE, FLUID (PLEURAL, PERITONEAL, JP DRAINAGE): Creat, Fluid: 0.9 mg/dL

## 2020-10-07 LAB — PHOSPHORUS: Phosphorus: 3.8 mg/dL (ref 2.5–4.6)

## 2020-10-07 MED ORDER — LIDOCAINE HCL 1 % IJ SOLN
INTRAMUSCULAR | Status: AC
  Filled 2020-10-07: qty 10

## 2020-10-07 NOTE — Progress Notes (Signed)
PROGRESS NOTE    Sheila SnellVickie S [name]  YQM:[id number]:[id number] DOB: 1975/06/01 DOA: 10/05/2020 PCP: Arnette FeltsMoore, Janece, FNP   Chief Complaint  Patient presents with  . Abdominal Pain    Brief Narrative:  [name] is [name] 45 y.o. female with medical history significant for obesity, moderate persistent asthma, anemia and GERD who presents with persistent abdominal pain.  She reports having diffuse cramping abdominal pain for the past week as well as dysuria and some urinary hesitancy.  Pain has been constant and worse with ambulation and eating.  Has had decreased p.o. intake and nausea but no vomiting.  Stool has been loose but no diarrhea.  No fevers.  Patient has history of 2 C-section, hysterectomy and cholecystectomy.  Has only occasional alcohol use.  No recent weight loss. No family hx of abdominal cancers or disease on mother's side. Does not know her father.   She presented with the symptoms to urgent care today and had UA showing showing hemoglobin and was asked to present to ED for worsening symptoms.  ED Course: She was hypertensive up to systolic of 150s.  CBC was negative.  Creatinine of 2.85 from prior of 0.88.  LFTs are normal. CT Abdomen shows moderate to large volume ascites within the abdomen and pelvis of uncertain origin.  Bedside ultrasound performed by ED Dr. Myrtis SerKatz but paracentesis not attempted due to intestines being close to abdominal wall.  Assessment & Plan:   Principal Problem:   Ascites Active Problems:   Obesity (BMI 35.0-39.9 without comorbidity)   Gastroesophageal reflux disease   AKI (acute kidney injury) (HCC)  Abdominal ascites Hx LSO with incidental cystotomy with repair in 01/2011, concern for uroperitoneum Spontaneous Extraperitoneal Bladder Perforation No abnormal LFTs, normal bili, normal platelets, normal INR.  No decreased synthetic function of liver to explain ascites.  AKI, but urine is bland, suggesting against nephrotic syndrome, appears relatively  euvolemic as well C/o abdominal discomfort with urination, hx bladder injury in past -> concerning for uroperitoneum S/p paracentesis by IR -> SAAG>1.1, 185 nucleated cells not c/w SBP.  Follow pending pathologist smear review, acid fast smear and culture, pH body fluid, aerobic/anaerobic culture - no growth to date, fungal culture, cytology.  LDH mildly elevated.  Glucose 71. Repeat para 6/4 with 1.9 L out.  Fluid creatinine 0.9.  Pending cell count.   CT with moderate to large volume ascites within abdomen and pelvis, hazy appearance of omentum and upper abdominal mesentery (edema vs peritoneal disease).  Diffuse wall thickening of bladder with focal wall thickening and calcification or postsurgical change along the left superolateral bladder as before CT cystogram with focal bladder leak identified arising from the apex of the bladder near its point of fixation within L retroperitoneum S/p foley catheter Urology c/s, appreciate assistance - planning for foley x2 weeks followed by cystogram in office prior to removing the catheter.  Recommended asking IR for pigtail (discussed with IR who recommended para and follow up).    Acute Kidney Injury  Urinary Retention  NAGMA Bladder scan with 400 cc retained Foley now in place No hydro on CT Suspect 2/2 above UA bland FeNa suggests prerenal etiology Improved, follow  GERD -continue PPI and famotidine  Moderate persistent asthma -Not in exacerbation.  Continue PRN albuterol  Obesity -BMI > 36. Complicates clinical course  Positive ANA Follows with rheum outpatient (06/21/2020 note from Dr. Corliss Skainseveshwar)  DVT prophylaxis: SCD Code Status: full  Family Communication: none at bedside Disposition:   Status is: Inpatient  Remains inpatient appropriate because:Inpatient level of care appropriate due to severity of illness   Dispo: The patient is from: Home              Anticipated d/c is to: Home              Patient currently is not  medically stable to d/c.   Difficult to place patient No       Consultants:   urology  Procedures:  none  Antimicrobials: = Anti-infectives (From admission, onward)   None         Subjective: No new complaints  Objective: Vitals:   10/07/20 1100 10/07/20 1312 10/07/20 1320 10/07/20 1345  BP: 123/77 130/82 133/72 130/86  Pulse:    73  Resp:    17  Temp:    98.4 F (36.9 C)  TempSrc:      SpO2:    100%  Weight:      Height:        Intake/Output Summary (Last 24 hours) at 10/07/2020 1502 Last data filed at 10/07/2020 1400 Gross per 24 hour  Intake 1774.78 ml  Output 4700 ml  Net -2925.22 ml   Filed Weights   10/05/20 1840  Weight: 95.3 kg    Examination:  General: No acute distress. Cardiovascular: Heart sounds show Dorien Mayotte regular rate, and rhythm. N Lungs: Clear to auscultation bilaterally  Abdomen: Soft, nontender, nondistended  Neurological: Alert and oriented 3. Moves all extremities 4 . Cranial nerves II through XII grossly intact. Skin: Warm and dry. No rashes or lesions. Extremities: No clubbing or cyanosis. No edema.    Data Reviewed: I have personally reviewed following labs and imaging studies  CBC: Recent Labs  Lab 10/05/20 1855 10/05/20 1944 10/05/20 2159 10/06/20 0506 10/07/20 0234  WBC 6.5  --  6.4 6.2 5.2  NEUTROABS  --   --   --   --  2.9  HGB 12.4 13.6 12.1 12.3 11.5*  HCT 38.8 40.0 37.1 38.6 36.1  MCV 80.8  --  80.0 82.0 80.8  PLT 330  --  306 217 297    Basic Metabolic Panel: Recent Labs  Lab 10/05/20 1855 10/05/20 1944 10/05/20 2159 10/06/20 0506 10/06/20 2017 10/07/20 0234  NA 134* 131* 134* 132* 134* 139  K 4.9 6.9* 4.1 4.3 4.2 4.1  CL 98 100 102 102 102 106  CO2 25  --  23 17* 22 24  GLUCOSE 90 84 86 75 66* 86  BUN 24* 35* 23* 26* 25* 15  CREATININE 2.83* 3.10* 2.85* 3.14* 1.98* 1.04*  CALCIUM 9.2  --  8.8* 8.6* 8.7* 8.8*  MG  --   --   --   --   --  2.0  PHOS  --   --   --   --   --  3.8     GFR: Estimated Creatinine Clearance: 76.5 mL/min (Luisangel Wainright) (by C-G formula based on SCr of 1.04 mg/dL (H)).  Liver Function Tests: Recent Labs  Lab 10/05/20 1855 10/05/20 2159 10/07/20 0234  AST 26 22 25   ALT 20 18 22   ALKPHOS 58 56 59  BILITOT 0.7 0.9 0.6  PROT 8.9* 8.0 7.6  ALBUMIN 4.9 4.6 3.8    CBG: Recent Labs  Lab 10/05/20 2013  GLUCAP 77     Recent Results (from the past 240 hour(s))  Urine culture     Status: Abnormal   Collection Time: 10/04/20  2:44 PM   Specimen: Urine, Random  Result  Value Ref Range Status   Specimen Description URINE, RANDOM  Final   Special Requests NONE  Final   Culture (Tymir Terral)  Final    <10,000 COLONIES/mL INSIGNIFICANT GROWTH Performed at [facility], [address] [place], [place] [address]    Report Status 10/05/2020 FINAL  Final  SARS CORONAVIRUS 2 (TAT 6-24 HRS) Nasopharyngeal Nasopharyngeal Swab     Status: None   Collection Time: 10/05/20  9:59 PM   Specimen: Nasopharyngeal Swab  Result Value Ref Range Status   SARS Coronavirus 2 NEGATIVE NEGATIVE Final    Comment: (NOTE) SARS-CoV-2 target nucleic acids are NOT DETECTED.  The SARS-CoV-2 RNA is generally detectable in upper and lower respiratory specimens during the acute phase of infection. Negative results do not preclude SARS-CoV-2 infection, do not rule out co-infections with other pathogens, and should not be used as the sole basis for treatment or other patient management decisions. Negative results must be combined with clinical observations, patient history, and epidemiological information. The expected result is Negative.  Fact Sheet for Patients: HairSlick.no  Fact Sheet for Healthcare Providers: quierodirigir.com  This test is not yet approved or cleared by the [place] FDA and  has been authorized for detection and/or diagnosis of SARS-CoV-2 by FDA under an Emergency Use Authorization (EUA).  This EUA will remain  in effect (meaning this test can be used) for the duration of the COVID-19 declaration under Se ction 564(b)(1) of the Act, 21 U.S.C. section 360bbb-3(b)(1), unless the authorization is terminated or revoked sooner.  Performed at [facility], [address] [place], [place] [address]   Aerobic/Anaerobic Culture w Gram Stain (surgical/deep wound)     Status: None (Preliminary result)   Collection Time: 10/06/20 11:04 AM   Specimen: PATH Cytology Peritoneal fluid  Result Value Ref Range Status   Specimen Description   Final    PERITONEAL Performed at [facility], [address], [place], [place] [address]    Special Requests   Final    NONE Performed at [facility], [address] [place], [place] [address]    Gram Stain   Final    WBC PRESENT, PREDOMINANTLY MONONUCLEAR NO ORGANISMS SEEN CYTOSPIN SMEAR    Culture   Final    NO GROWTH < 24 HOURS Performed at [facility], [address], [place], [place] [address]    Report Status PENDING  Incomplete         Radiology Studies: CT ABDOMEN PELVIS WO CONTRAST  Result Date: 10/05/2020 CLINICAL DATA:  Abdomen pain EXAM: CT ABDOMEN AND PELVIS WITHOUT CONTRAST TECHNIQUE: Multidetector CT imaging of the abdomen and pelvis was performed following the standard protocol without IV contrast. COMPARISON:  11/01/2015 CT, MRI 02/10/2017 FINDINGS: Lower chest: Lung bases demonstrate streaky atelectasis in the lower lobes and right middle lobe. No significant pleural effusion. Normal cardiac size. Hepatobiliary: No focal hepatic abnormality. Status post cholecystectomy. No biliary dilatation. Pancreas: Unremarkable. No pancreatic ductal dilatation or surrounding inflammatory changes. Spleen: Normal in size without focal abnormality. Adrenals/Urinary Tract: Adrenal glands are within normal limits. Kidneys show no hydronephrosis. Dilated urinary bladder with wall  thickening. Focal wall thickening and calcification or surgical change along the superolateral aspect of the left bladder similar as compared with 2017 exam. Stomach/Bowel: The stomach is nonenlarged. No dilated small bowel. No acute bowel wall thickening Vascular/Lymphatic: Nonaneurysmal aorta.  No suspicious nodes Reproductive: Status post hysterectomy. No adnexal masses. Other: No free air. Moderate to  large volume of ascites within the abdomen and pelvis. Stranding or mild infiltrated appearance of the omentum and upper abdominal mesentery. Musculoskeletal: No acute or suspicious osseous abnormality. IMPRESSION: 1. Moderate to large volume of ascites within the abdomen and pelvis of uncertain source. Hazy appearance of the omentum and upper abdominal mesentery, uncertain if this is due to edema or peritoneal disease. Consider correlation with diagnostic paracentesis. 2. Diffuse wall thickening of the bladder with focal wall thickening and calcification or postsurgical change along the left superolateral bladder as before. Electronically Signed   By: Jasmine Pang M.D.   On: 10/05/2020 21:03   US Abdomen Limited  Result Date: 10/06/2020 CLINICAL DATA:  Abdominal distension EXAM: LIMITED ABDOMEN ULTRASOUND FOR ASCITES TECHNIQUE: Limited ultrasound survey for ascites was performed in all four abdominal quadrants. COMPARISON:  None. FINDINGS: There is moderate ascites throughout the abdomen and pelvis. Loops of peristalsing bowel noted. IMPRESSION: Moderate ascites throughout abdomen and pelvis noted. Electronically Signed   By: Bretta Bang III M.D.   On: 10/06/2020 15:10   US Paracentesis  Result Date: 10/07/2020 INDICATION: Patient with history of abdominal pain, AKI, extraperitoneal bladder perforation, abdominal distension, and recurrent ascites. Request is made for diagnostic and therapeutic paracentesis. EXAM: ULTRASOUND GUIDED DIAGNOSTIC AND THERAPEUTIC PARACENTESIS MEDICATIONS: 10 mL 1% lidocaine  COMPLICATIONS: None immediate. PROCEDURE: Informed written consent was obtained from the patient after Inez Rosato discussion of the risks, benefits and alternatives to treatment. Rilee Knoll timeout was performed prior to the initiation of the procedure. Initial ultrasound scanning demonstrates Paislie Tessler large amount of ascites within the right lower abdominal quadrant. The right lower abdomen was prepped and draped in the usual sterile fashion. 1% lidocaine was used for local anesthesia. Following this, Danielly Ackerley 19 gauge, 10-cm, Yueh catheter was introduced. An ultrasound image was saved for documentation purposes. The paracentesis was performed. The catheter was removed and Remas Sobel dressing was applied. The patient tolerated the procedure well without immediate post procedural complication. FINDINGS: Lezlie Ritchey total of approximately 1.9 L of clear yellow fluid was removed. Samples were sent to the laboratory as requested by the clinical team. IMPRESSION: Successful ultrasound-guided paracentesis yielding 1.9 L of peritoneal fluid. Read by: Elwin Mocha, PA-C Electronically Signed   By: Irish Lack M.D.   On: 10/07/2020 14:23   US Paracentesis  Result Date: 10/06/2020 INDICATION: Patient with history of abdominal pain, dysuria, acute kidney injury, GERD, ascites. Request received for diagnostic paracentesis. EXAM: ULTRASOUND GUIDED DIAGNOSTIC PARACENTESIS MEDICATIONS: 1% lidocaine to skin and subcutaneous tissue COMPLICATIONS: None immediate. PROCEDURE: Informed written consent was obtained from the patient after Lakeem Rozo discussion of the risks, benefits and alternatives to treatment. Eliska Hamil timeout was performed prior to the initiation of the procedure. Initial ultrasound scanning demonstrates Artina Minella moderate to large amount of ascites within the left mid to lower abdominal quadrant. The left mid to lower abdomen was prepped and draped in the usual sterile fashion. 1% lidocaine was used for local anesthesia. Following this, Lubertha Leite 19 gauge, 10-cm, Yueh catheter was  introduced. An ultrasound image was saved for documentation purposes. The paracentesis was performed. The catheter was removed and Damica Gravlin dressing was applied. The patient tolerated the procedure well without immediate post procedural complication. FINDINGS: Sandara Tyree total of approximately 210 cc of very light yellow (essentially clear, colorless) fluid was removed. Samples were sent to the laboratory as requested by the clinical team. IMPRESSION: Successful ultrasound-guided diagnostic paracentesis yielding 210 cc of peritoneal fluid. Read by: Jeananne Rama, PA-C Electronically Signed   By: Holland Commons.D.  On: 10/06/2020 10:59   DG CHEST PORT 1 VIEW  Result Date: 10/06/2020 CLINICAL DATA:  Shortness of breath.  History of asthma. EXAM: PORTABLE CHEST 1 VIEW COMPARISON:  PA and lateral chest 12/05/2019. FINDINGS: There is streaky bibasilar atelectasis, greater on the right. No pneumothorax or pleural fluid. Heart size is normal. No acute or focal bony abnormality. IMPRESSION: Bibasilar subsegmental atelectasis, worse on the right. Electronically Signed   By: Drusilla Kanner M.D.   On: 10/06/2020 11:14   CT CYSTOGRAM PELVIS  Addendum Date: 10/06/2020   ADDENDUM REPORT: 10/06/2020 21:23 ADDENDUM: Upon further review there is Geroge Gilliam focal bladder leak identified arising from the apex of the bladder near its point of fixation with the left retroperitoneum, best appreciated on axial images # 16-23 and sagittal images # 90-93. These results were discussed by telephone at the time of this addendum on 10/06/2020 at 9:23 pm to provider Laporscha Linehan Shaune Spittle , who verbally acknowledged these results. Electronically Signed   By: Helyn Numbers MD   On: 10/06/2020 21:23   Result Date: 10/06/2020 CLINICAL DATA:  Ascites, abdominal pain. Assess for bladder injury. Remote history of incidental cystotomy. EXAM: CT CYSTOGRAM (CT PELVIS WITH CONTRAST) TECHNIQUE: Multidetector CT imaging through the pelvis was performed after dilute contrast had been  introduced into the bladder for the purposes of performing CT cystography. CONTRAST:  44mL OMNIPAQUE IOHEXOL 300 MG/ML  SOLN COMPARISON:  10/05/2020, 11/01/2015 FINDINGS: Urinary Tract: Foley catheter balloon is seen within the bladder lumen. Retrograde installation of contrast adequately opacifies the bladder lumen. There is no extraluminal extension of contrast to suggest Regan Llorente leak. The bladder wall is thickened and trabeculated, in keeping with changes of chronic bladder outlet obstruction, similar to that seen on both prior examinations. The bladder itself appears fixed into the left retroperitoneum in keeping with surgical changes of Kysa Calais left psoas hitch. The distal ureters are not opacified. Bowel: The visualized large and small bowel are unremarkable. Large volume ascites is again noted. Vascular/Lymphatic: The pelvic vasculature is unremarkable on this noncontrast examination peer Reproductive:  The uterus is absent.  No adnexal masses. Other:  No abdominal wall hernia.  The rectum is unremarkable. Musculoskeletal: The osseous structures are unremarkable. IMPRESSION: No evidence of bladder leak or perforation. Stable diffuse bladder wall thickening and trabeculation in keeping with changes of chronic bladder outlet obstruction. Surgical changes of left psoas hitch again noted. Electronically Signed: By: Helyn Numbers MD On: 10/06/2020 20:20   US Abdomen Limited RUQ (LIVER/GB)  Result Date: 10/06/2020 CLINICAL DATA:  Evaluate liver and gallbladder, ascites. History of cholecystectomy. EXAM: ULTRASOUND ABDOMEN LIMITED RIGHT UPPER QUADRANT COMPARISON:  Ultrasound abdomen 10/06/2020. FINDINGS: Gallbladder: Status post cholecystectomy. Common bile duct: Diameter: 5 mm. Liver: No focal lesion identified. Within normal limits in parenchymal echogenicity. Portal vein is patent on color Doppler imaging with normal direction of blood flow towards the liver. Other: Interval decrease in now at least small volume free  fluid within the upper abdomen is noted. IMPRESSION: Interval decrease in now at least small volume ascites in the upper abdomen. Electronically Signed   By: Tish Frederickson M.D.   On: 10/06/2020 22:06        Scheduled Meds: . Chlorhexidine Gluconate Cloth  6 each Topical Daily  . famotidine  20 mg Oral Daily  . lidocaine      . pantoprazole  40 mg Oral Daily   Continuous Infusions:    LOS: 1 day    Time spent: over 30 min  Lacretia Nicks, MD [facility]   To contact the attending provider between 7A-7P or the covering provider during after hours 7P-7A, please log into the web site [email or web address] and access using universal  password for that web site. If you do not have the password, please call the hospital operator.  10/07/2020, 3:02 PM

## 2020-10-07 NOTE — Procedures (Signed)
PROCEDURE SUMMARY:  Successful image-guided paracentesis from the right lateral abdomen.  Yielded 1.9 liters of clear yellow fluid.  No immediate complications.  EBL = 0 mL. Patient tolerated well.   Specimen was sent for labs.  Please see imaging section of Epic for full dictation.   Gordy Councilman Lorien Shingler PA-C 10/07/2020 2:21 PM

## 2020-10-07 NOTE — Plan of Care (Signed)
  Problem: Education: Goal: Knowledge of General Education information will improve Description: Including pain rating scale, medication(s)/side effects and non-pharmacologic comfort measures Outcome: Progressing   Problem: Activity: Goal: Risk for activity intolerance will decrease Outcome: Progressing   Problem: Nutrition: Goal: Adequate nutrition will be maintained Outcome: Progressing   

## 2020-10-07 NOTE — Plan of Care (Signed)
  Problem: Pain Managment: Goal: General experience of comfort will improve Outcome: Progressing   Problem: Coping: Goal: Level of anxiety will decrease Outcome: Progressing   

## 2020-10-07 NOTE — Progress Notes (Signed)
Urology Inpatient Progress Report  Other ascites [R18.8] AKI (acute kidney injury) (HCC) [N17.9] Ascites [R18.8]  Intv/Subj: No acute events overnight. Patient is without complaint.  Pain improved, but still requiring IV pain medications.  Starting to feel better.  Pain mostly in her pelvis.  Principal Problem:   Ascites Active Problems:   Obesity (BMI 35.0-39.9 without comorbidity)   Gastroesophageal reflux disease   AKI (acute kidney injury) (HCC)  Current Facility-Administered Medications  Medication Dose Route Frequency Provider Last Rate Last Admin  . albuterol (VENTOLIN HFA) 108 (90 Base) MCG/ACT inhaler 2 puff  2 puff Inhalation Q6H PRN Tu, Ching T, DO      . Chlorhexidine Gluconate Cloth 2 % PADS 6 each  6 each Topical Daily Zigmund Daniel., MD   6 each at 10/06/20 1641  . famotidine (PEPCID) tablet 20 mg  20 mg Oral Daily Tu, Ching T, DO   20 mg at 10/06/20 0827  . fentaNYL (SUBLIMAZE) injection 25-50 mcg  25-50 mcg Intravenous Q2H PRN Opyd, Lavone Neri, MD   50 mcg at 10/07/20 0315  . lactated ringers infusion   Intravenous Continuous Zigmund Daniel., MD 100 mL/hr at 10/07/20 0316 New Bag at 10/07/20 0316  . ondansetron (ZOFRAN) injection 4 mg  4 mg Intravenous Q6H PRN Tu, Ching T, DO   4 mg at 10/06/20 2109  . pantoprazole (PROTONIX) EC tablet 40 mg  40 mg Oral Daily Tu, Ching T, DO   40 mg at 10/06/20 3419  . zolpidem (AMBIEN) tablet 5 mg  5 mg Oral QHS PRN Benita Gutter T, DO         Objective: Vital: Vitals:   10/06/20 1310 10/06/20 1618 10/06/20 2215 10/07/20 0532  BP: (!) 152/107 (!) 134/95 134/83 (!) 148/87  Pulse: 84 96 79 73  Resp: 19 19 17 18   Temp: 98.7 F (37.1 C) 99 F (37.2 C) 98.4 F (36.9 C) 98.2 F (36.8 C)  TempSrc: Oral Oral Oral Oral  SpO2: 94% 98% 100% 100%  Weight:      Height:       I/Os: I/O last 3 completed shifts: In: 3238.5 [P.O.:480; I.V.:1160.1; IV Piggyback:1598.4] Out: 4400 [Urine:4400]  Physical Exam:  General:  Patient is in no apparent distress Lungs: Normal respiratory effort, chest expands symmetrically. GI:  The abdomen is soft and tender in the lower abdomen Foley: draining straw colored urine  Ext: lower extremities symmetric  Lab Results: Recent Labs    10/05/20 2159 10/06/20 0506 10/07/20 0234  WBC 6.4 6.2 5.2  HGB 12.1 12.3 11.5*  HCT 37.1 38.6 36.1   Recent Labs    10/06/20 0506 10/06/20 2017 10/07/20 0234  NA 132* 134* 139  K 4.3 4.2 4.1  CL 102 102 106  CO2 17* 22 24  GLUCOSE 75 66* 86  BUN 26* 25* 15  CREATININE 3.14* 1.98* 1.04*  CALCIUM 8.6* 8.7* 8.8*   Recent Labs    10/06/20 0907  INR 0.9   No results for input(s): LABURIN in the last 72 hours. Results for orders placed or performed during the hospital encounter of 10/05/20  SARS CORONAVIRUS 2 (TAT 6-24 HRS) Nasopharyngeal Nasopharyngeal Swab     Status: None   Collection Time: 10/05/20  9:59 PM   Specimen: Nasopharyngeal Swab  Result Value Ref Range Status   SARS Coronavirus 2 NEGATIVE NEGATIVE Final    Comment: (NOTE) SARS-CoV-2 target nucleic acids are NOT DETECTED.  The SARS-CoV-2 RNA is generally detectable in upper  and lower respiratory specimens during the acute phase of infection. Negative results do not preclude SARS-CoV-2 infection, do not rule out co-infections with other pathogens, and should not be used as the sole basis for treatment or other patient management decisions. Negative results must be combined with clinical observations, patient history, and epidemiological information. The expected result is Negative.  Fact Sheet for Patients: HairSlick.nohttps://www.fda.gov/media/138098/download  Fact Sheet for Healthcare Providers: quierodirigir.comhttps://www.fda.gov/media/138095/download  This test is not yet approved or cleared by the [place] FDA and  has been authorized for detection and/or diagnosis of SARS-CoV-2 by FDA under an Emergency Use Authorization (EUA). This EUA will remain  in effect  (meaning this test can be used) for the duration of the COVID-19 declaration under Se ction 564(b)(1) of the Act, 21 U.S.C. section 360bbb-3(b)(1), unless the authorization is terminated or revoked sooner.  Performed at [facility], [address] [place], [place] [address]   Aerobic/Anaerobic Culture w Gram Stain (surgical/deep wound)     Status: None (Preliminary result)   Collection Time: 10/06/20 11:04 AM   Specimen: PATH Cytology Peritoneal fluid  Result Value Ref Range Status   Specimen Description   Final    PERITONEAL Performed at [facility], [address] [place], [place] [address]    Special Requests   Final    NONE Performed at [facility], [address] [place], [place] [address]    Gram Stain   Final    WBC PRESENT, PREDOMINANTLY MONONUCLEAR NO ORGANISMS SEEN CYTOSPIN SMEAR Performed at [facility], [address] [place], [place] [address]    Culture PENDING  Incomplete   Report Status PENDING  Incomplete    Studies/Results: CT ABDOMEN PELVIS WO CONTRAST  Result Date: 10/05/2020 CLINICAL DATA:  Abdomen pain EXAM: CT ABDOMEN AND PELVIS WITHOUT CONTRAST TECHNIQUE: Multidetector CT imaging of the abdomen and pelvis was performed following the standard protocol without IV contrast. COMPARISON:  11/01/2015 CT, MRI 02/10/2017 FINDINGS: Lower chest: Lung bases demonstrate streaky atelectasis in the lower lobes and right middle lobe. No significant pleural effusion. Normal cardiac size. Hepatobiliary: No focal hepatic abnormality. Status post cholecystectomy. No biliary dilatation. Pancreas: Unremarkable. No pancreatic ductal dilatation or surrounding inflammatory changes. Spleen: Normal in size without focal abnormality. Adrenals/Urinary Tract: Adrenal glands are within normal limits. Kidneys show no hydronephrosis. Dilated urinary bladder with wall thickening. Focal wall thickening and calcification or surgical  change along the superolateral aspect of the left bladder similar as compared with 2017 exam. Stomach/Bowel: The stomach is nonenlarged. No dilated small bowel. No acute bowel wall thickening Vascular/Lymphatic: Nonaneurysmal aorta.  No suspicious nodes Reproductive: Status post hysterectomy. No adnexal masses. Other: No free air. Moderate to large volume of ascites within the abdomen and pelvis. Stranding or mild infiltrated appearance of the omentum and upper abdominal mesentery. Musculoskeletal: No acute or suspicious osseous abnormality. IMPRESSION: 1. Moderate to large volume of ascites within the abdomen and pelvis of uncertain source. Hazy appearance of the omentum and upper abdominal mesentery, uncertain if this is due to edema or peritoneal disease. Consider correlation with diagnostic paracentesis. 2. Diffuse wall thickening of the bladder with focal wall thickening and calcification or postsurgical change along the left superolateral bladder as before. Electronically Signed   By: Jasmine PangKim  Fujinaga M.D.   On: 10/05/2020 21:03   US Abdomen Limited  Result Date: 10/06/2020 CLINICAL DATA:  Abdominal distension EXAM: LIMITED ABDOMEN ULTRASOUND FOR ASCITES TECHNIQUE: Limited ultrasound survey for ascites was performed  in all four abdominal quadrants. COMPARISON:  None. FINDINGS: There is moderate ascites throughout the abdomen and pelvis. Loops of peristalsing bowel noted. IMPRESSION: Moderate ascites throughout abdomen and pelvis noted. Electronically Signed   By: Bretta Bang III M.D.   On: 10/06/2020 15:10   US Paracentesis  Result Date: 10/06/2020 INDICATION: Patient with history of abdominal pain, dysuria, acute kidney injury, GERD, ascites. Request received for diagnostic paracentesis. EXAM: ULTRASOUND GUIDED DIAGNOSTIC PARACENTESIS MEDICATIONS: 1% lidocaine to skin and subcutaneous tissue COMPLICATIONS: None immediate. PROCEDURE: Informed written consent was obtained from the patient after a  discussion of the risks, benefits and alternatives to treatment. A timeout was performed prior to the initiation of the procedure. Initial ultrasound scanning demonstrates a moderate to large amount of ascites within the left mid to lower abdominal quadrant. The left mid to lower abdomen was prepped and draped in the usual sterile fashion. 1% lidocaine was used for local anesthesia. Following this, a 19 gauge, 10-cm, Yueh catheter was introduced. An ultrasound image was saved for documentation purposes. The paracentesis was performed. The catheter was removed and a dressing was applied. The patient tolerated the procedure well without immediate post procedural complication. FINDINGS: A total of approximately 210 cc of very light yellow (essentially clear, colorless) fluid was removed. Samples were sent to the laboratory as requested by the clinical team. IMPRESSION: Successful ultrasound-guided diagnostic paracentesis yielding 210 cc of peritoneal fluid. Read by: Jeananne Rama, PA-C Electronically Signed   By: Simonne Come M.D.   On: 10/06/2020 10:59   DG CHEST PORT 1 VIEW  Result Date: 10/06/2020 CLINICAL DATA:  Shortness of breath.  History of asthma. EXAM: PORTABLE CHEST 1 VIEW COMPARISON:  PA and lateral chest 12/05/2019. FINDINGS: There is streaky bibasilar atelectasis, greater on the right. No pneumothorax or pleural fluid. Heart size is normal. No acute or focal bony abnormality. IMPRESSION: Bibasilar subsegmental atelectasis, worse on the right. Electronically Signed   By: Drusilla Kanner M.D.   On: 10/06/2020 11:14   CT CYSTOGRAM PELVIS  Addendum Date: 10/06/2020   ADDENDUM REPORT: 10/06/2020 21:23 ADDENDUM: Upon further review there is a focal bladder leak identified arising from the apex of the bladder near its point of fixation with the left retroperitoneum, best appreciated on axial images # 16-23 and sagittal images # 90-93. These results were discussed by telephone at the time of this addendum on  10/06/2020 at 9:23 pm to provider A Shaune Spittle , who verbally acknowledged these results. Electronically Signed   By: Helyn Numbers MD   On: 10/06/2020 21:23   Result Date: 10/06/2020 CLINICAL DATA:  Ascites, abdominal pain. Assess for bladder injury. Remote history of incidental cystotomy. EXAM: CT CYSTOGRAM (CT PELVIS WITH CONTRAST) TECHNIQUE: Multidetector CT imaging through the pelvis was performed after dilute contrast had been introduced into the bladder for the purposes of performing CT cystography. CONTRAST:  29mL OMNIPAQUE IOHEXOL 300 MG/ML  SOLN COMPARISON:  10/05/2020, 11/01/2015 FINDINGS: Urinary Tract: Foley catheter balloon is seen within the bladder lumen. Retrograde installation of contrast adequately opacifies the bladder lumen. There is no extraluminal extension of contrast to suggest a leak. The bladder wall is thickened and trabeculated, in keeping with changes of chronic bladder outlet obstruction, similar to that seen on both prior examinations. The bladder itself appears fixed into the left retroperitoneum in keeping with surgical changes of a left psoas hitch. The distal ureters are not opacified. Bowel: The visualized large and small bowel are unremarkable. Large volume ascites is again noted.  Vascular/Lymphatic: The pelvic vasculature is unremarkable on this noncontrast examination peer Reproductive:  The uterus is absent.  No adnexal masses. Other:  No abdominal wall hernia.  The rectum is unremarkable. Musculoskeletal: The osseous structures are unremarkable. IMPRESSION: No evidence of bladder leak or perforation. Stable diffuse bladder wall thickening and trabeculation in keeping with changes of chronic bladder outlet obstruction. Surgical changes of left psoas hitch again noted. Electronically Signed: By: Helyn Numbers MD On: 10/06/2020 20:20   US Abdomen Limited RUQ (LIVER/GB)  Result Date: 10/06/2020 CLINICAL DATA:  Evaluate liver and gallbladder, ascites. History of  cholecystectomy. EXAM: ULTRASOUND ABDOMEN LIMITED RIGHT UPPER QUADRANT COMPARISON:  Ultrasound abdomen 10/06/2020. FINDINGS: Gallbladder: Status post cholecystectomy. Common bile duct: Diameter: 5 mm. Liver: No focal lesion identified. Within normal limits in parenchymal echogenicity. Portal vein is patent on color Doppler imaging with normal direction of blood flow towards the liver. Other: Interval decrease in now at least small volume free fluid within the upper abdomen is noted. IMPRESSION: Interval decrease in now at least small volume ascites in the upper abdomen. Electronically Signed   By: Tish Frederickson M.D.   On: 10/06/2020 22:06    Assessment: Spontaneous extraperitoneal bladder perforation, in the area of previous injury and repair.  Seems to be improving with a foley.  CT demonstrated a large about of fluid in the extraperitoneal space.  Plan: Foley for 2 weeks (at least) followed by a cystogram in the office prior to removing the catheter. Would ask IR if they could place pigtail in pelvis for a day or two to drain the urine in that area - She'd feel significantly better.  Will continue to follow.  Berniece Salines, MD Urology 10/07/2020, 8:37 AM

## 2020-10-08 ENCOUNTER — Inpatient Hospital Stay (HOSPITAL_COMMUNITY): Payer: 59

## 2020-10-08 LAB — COMPREHENSIVE METABOLIC PANEL
ALT: 20 U/L (ref 0–44)
AST: 23 U/L (ref 15–41)
Albumin: 3.5 g/dL (ref 3.5–5.0)
Alkaline Phosphatase: 48 U/L (ref 38–126)
Anion gap: 7 (ref 5–15)
BUN: 9 mg/dL (ref 6–20)
CO2: 28 mmol/L (ref 22–32)
Calcium: 8.7 mg/dL — ABNORMAL LOW (ref 8.9–10.3)
Chloride: 104 mmol/L (ref 98–111)
Creatinine, Ser: 0.67 mg/dL (ref 0.44–1.00)
GFR, Estimated: >60 mL/min (ref >60)
Glucose, Bld: 109 mg/dL — ABNORMAL HIGH (ref 70–99)
Potassium: 3.8 mmol/L (ref 3.5–5.1)
Sodium: 139 mmol/L (ref 135–145)
Total Bilirubin: 0.5 mg/dL (ref 0.3–1.2)
Total Protein: 6.6 g/dL (ref 6.5–8.1)

## 2020-10-08 LAB — PH, BODY FLUID: pH, Body Fluid: 7.5

## 2020-10-08 LAB — CBC WITH DIFFERENTIAL/PLATELET
Abs Immature Granulocytes: 0.01 10*3/uL (ref 0.00–0.07)
Basophils Absolute: 0 10*3/uL (ref 0.0–0.1)
Basophils Relative: 0 %
Eosinophils Absolute: 0.1 10*3/uL (ref 0.0–0.5)
Eosinophils Relative: 2 %
HCT: 31.7 % — ABNORMAL LOW (ref 36.0–46.0)
Hemoglobin: 10.1 g/dL — ABNORMAL LOW (ref 12.0–15.0)
Immature Granulocytes: 0 %
Lymphocytes Relative: 35 %
Lymphs Abs: 1.9 10*3/uL (ref 0.7–4.0)
MCH: 25.8 pg — ABNORMAL LOW (ref 26.0–34.0)
MCHC: 31.9 g/dL (ref 30.0–36.0)
MCV: 81.1 fL (ref 80.0–100.0)
Monocytes Absolute: 0.6 10*3/uL (ref 0.1–1.0)
Monocytes Relative: 11 %
Neutro Abs: 2.9 10*3/uL (ref 1.7–7.7)
Neutrophils Relative %: 52 %
Platelets: 274 10*3/uL (ref 150–400)
RBC: 3.91 MIL/uL (ref 3.87–5.11)
RDW: 13 % (ref 11.5–15.5)
WBC: 5.6 10*3/uL (ref 4.0–10.5)
nRBC: 0 % (ref 0.0–0.2)

## 2020-10-08 LAB — PHOSPHORUS: Phosphorus: 3 mg/dL (ref 2.5–4.6)

## 2020-10-08 LAB — MAGNESIUM: Magnesium: 1.7 mg/dL (ref 1.7–2.4)

## 2020-10-08 MED ORDER — OXYCODONE HCL 5 MG PO TABS
5.0000 mg | ORAL_TABLET | ORAL | Status: DC | PRN
  Administered 2020-10-08: 5 mg via ORAL
  Filled 2020-10-08: qty 1

## 2020-10-08 MED ORDER — DICLOFENAC SODIUM 1 % EX GEL
2.0000 g | Freq: Four times a day (QID) | CUTANEOUS | Status: DC
  Administered 2020-10-08 (×3): 2 g via TOPICAL
  Filled 2020-10-08 (×2): qty 100

## 2020-10-08 MED ORDER — ACETAMINOPHEN 500 MG PO TABS
1000.0000 mg | ORAL_TABLET | Freq: Three times a day (TID) | ORAL | Status: DC
  Administered 2020-10-08 – 2020-10-09 (×3): 1000 mg via ORAL
  Filled 2020-10-08 (×3): qty 2

## 2020-10-08 NOTE — Progress Notes (Signed)
Urology Inpatient Progress Report  Other ascites [R18.8] AKI (acute kidney injury) (HCC) [N17.9] Ascites [R18.8]  Intv/Subj: No acute events overnight. The patient had another paracentesis draining 1.9 L of clear yellow fluid.  Creatinine did return at 0.9.  The patient feels significantly better after that.  Principal Problem:   Ascites Active Problems:   Obesity (BMI 35.0-39.9 without comorbidity)   Gastroesophageal reflux disease   AKI (acute kidney injury) (HCC)  Current Facility-Administered Medications  Medication Dose Route Frequency Provider Last Rate Last Admin  . albuterol (VENTOLIN HFA) 108 (90 Base) MCG/ACT inhaler 2 puff  2 puff Inhalation Q6H PRN Tu, Ching T, DO      . Chlorhexidine Gluconate Cloth 2 % PADS 6 each  6 each Topical Daily Zigmund Daniel., MD   6 each at 10/07/20 0901  . famotidine (PEPCID) tablet 20 mg  20 mg Oral Daily Tu, Ching T, DO   20 mg at 10/08/20 1019  . fentaNYL (SUBLIMAZE) injection 25-50 mcg  25-50 mcg Intravenous Q2H PRN Opyd, Lavone Neri, MD   50 mcg at 10/07/20 1145  . ondansetron (ZOFRAN) injection 4 mg  4 mg Intravenous Q6H PRN Tu, Ching T, DO   4 mg at 10/06/20 2109  . pantoprazole (PROTONIX) EC tablet 40 mg  40 mg Oral Daily Tu, Ching T, DO   40 mg at 10/08/20 1019  . zolpidem (AMBIEN) tablet 5 mg  5 mg Oral QHS PRN Tu, Gretel Acre T, DO         Objective: Vital: Vitals:   10/07/20 1320 10/07/20 1345 10/07/20 2156 10/08/20 0527  BP: 133/72 130/86 127/85 117/69  Pulse:  73 78 73  Resp:  17 16 16   Temp:  98.4 F (36.9 C) 98.6 F (37 C) 98.6 F (37 C)  TempSrc:   Oral   SpO2:  100% 100% 98%  Weight:      Height:       I/Os: I/O last 3 completed shifts: In: 2275.8 [P.O.:720; I.V.:1555.8] Out: 7000 [Urine:7000]  Physical Exam:  General: Patient is in no apparent distress Lungs: Normal respiratory effort, chest expands symmetrically. GI:  The abdomen is soft and tender in the lower abdomen Foley: draining straw colored  urine  Ext: lower extremities symmetric  Lab Results: Recent Labs    10/06/20 0506 10/07/20 0234 10/08/20 0220  WBC 6.2 5.2 5.6  HGB 12.3 11.5* 10.1*  HCT 38.6 36.1 31.7*   Recent Labs    10/06/20 2017 10/07/20 0234 10/08/20 0220  NA 134* 139 139  K 4.2 4.1 3.8  CL 102 106 104  CO2 22 24 28   GLUCOSE 66* 86 109*  BUN 25* 15 9  CREATININE 1.98* 1.04* 0.67  CALCIUM 8.7* 8.8* 8.7*   Recent Labs    10/06/20 0907  INR 0.9   No results for input(s): LABURIN in the last 72 hours. Results for orders placed or performed during the hospital encounter of 10/05/20  SARS CORONAVIRUS 2 (TAT 6-24 HRS) Nasopharyngeal Nasopharyngeal Swab     Status: None   Collection Time: 10/05/20  9:59 PM   Specimen: Nasopharyngeal Swab  Result Value Ref Range Status   SARS Coronavirus 2 NEGATIVE NEGATIVE Final    Comment: (NOTE) SARS-CoV-2 target nucleic acids are NOT DETECTED.  The SARS-CoV-2 RNA is generally detectable in upper and lower respiratory specimens during the acute phase of infection. Negative results do not preclude SARS-CoV-2 infection, do not rule out co-infections with other pathogens, and should not be used  as the sole basis for treatment or other patient management decisions. Negative results must be combined with clinical observations, patient history, and epidemiological information. The expected result is Negative.  Fact Sheet for Patients: HairSlick.no  Fact Sheet for Healthcare Providers: quierodirigir.com  This test is not yet approved or cleared by the [place] FDA and  has been authorized for detection and/or diagnosis of SARS-CoV-2 by FDA under an Emergency Use Authorization (EUA). This EUA will remain  in effect (meaning this test can be used) for the duration of the COVID-19 declaration under Se ction 564(b)(1) of the Act, 21 U.S.C. section 360bbb-3(b)(1), unless the authorization is terminated  or revoked sooner.  Performed at [facility], [address], [place], [place] [address]   Aerobic/Anaerobic Culture w Gram Stain (surgical/deep wound)     Status: None (Preliminary result)   Collection Time: 10/06/20 11:04 AM   Specimen: PATH Cytology Peritoneal fluid  Result Value Ref Range Status   Specimen Description   Final    PERITONEAL Performed at [facility], [address], [place], [place] [address]    Special Requests   Final    NONE Performed at [facility], [address] [place], [place] [address]    Gram Stain   Final    WBC PRESENT, PREDOMINANTLY MONONUCLEAR NO ORGANISMS SEEN CYTOSPIN SMEAR    Culture   Final    NO GROWTH < 24 HOURS Performed at [facility], [address], [place], [place] [address]    Report Status PENDING  Incomplete    Studies/Results: US Abdomen Limited  Result Date: 10/06/2020 CLINICAL DATA:  Abdominal distension EXAM: LIMITED ABDOMEN ULTRASOUND FOR ASCITES TECHNIQUE: Limited ultrasound survey for ascites was performed in all four abdominal quadrants. COMPARISON:  None. FINDINGS: There is moderate ascites throughout the abdomen and pelvis. Loops of peristalsing bowel noted. IMPRESSION: Moderate ascites throughout abdomen and pelvis noted. Electronically Signed   By: Bretta Bang III M.D.   On: 10/06/2020 15:10   US Paracentesis  Result Date: 10/07/2020 INDICATION: Patient with history of abdominal pain, AKI, extraperitoneal bladder perforation, abdominal distension, and recurrent ascites. Request is made for diagnostic and therapeutic paracentesis. EXAM: ULTRASOUND GUIDED DIAGNOSTIC AND THERAPEUTIC PARACENTESIS MEDICATIONS: 10 mL 1% lidocaine COMPLICATIONS: None immediate. PROCEDURE: Informed written consent was obtained from the patient after a discussion of the risks, benefits and alternatives to treatment. A timeout was performed prior to the initiation of the procedure. Initial  ultrasound scanning demonstrates a large amount of ascites within the right lower abdominal quadrant. The right lower abdomen was prepped and draped in the usual sterile fashion. 1% lidocaine was used for local anesthesia. Following this, a 19 gauge, 10-cm, Yueh catheter was introduced. An ultrasound image was saved for documentation purposes. The paracentesis was performed. The catheter was removed and a dressing was applied. The patient tolerated the procedure well without immediate post procedural complication. FINDINGS: A total of approximately 1.9 L of clear yellow fluid was removed. Samples were sent to the laboratory as requested by the clinical team. IMPRESSION: Successful ultrasound-guided paracentesis yielding 1.9 L of peritoneal fluid. Read by: Elwin Mocha, PA-C Electronically Signed   By: Irish Lack M.D.   On: 10/07/2020 14:23   US Paracentesis  Result Date: 10/06/2020 INDICATION: Patient with history of abdominal pain, dysuria, acute kidney injury, GERD, ascites. Request received for diagnostic paracentesis. EXAM: ULTRASOUND GUIDED DIAGNOSTIC PARACENTESIS MEDICATIONS: 1% lidocaine to skin and subcutaneous tissue COMPLICATIONS: None immediate. PROCEDURE:  Informed written consent was obtained from the patient after a discussion of the risks, benefits and alternatives to treatment. A timeout was performed prior to the initiation of the procedure. Initial ultrasound scanning demonstrates a moderate to large amount of ascites within the left mid to lower abdominal quadrant. The left mid to lower abdomen was prepped and draped in the usual sterile fashion. 1% lidocaine was used for local anesthesia. Following this, a 19 gauge, 10-cm, Yueh catheter was introduced. An ultrasound image was saved for documentation purposes. The paracentesis was performed. The catheter was removed and a dressing was applied. The patient tolerated the procedure well without immediate post procedural complication.  FINDINGS: A total of approximately 210 cc of very light yellow (essentially clear, colorless) fluid was removed. Samples were sent to the laboratory as requested by the clinical team. IMPRESSION: Successful ultrasound-guided diagnostic paracentesis yielding 210 cc of peritoneal fluid. Read by: Jeananne Rama, PA-C Electronically Signed   By: Simonne Come M.D.   On: 10/06/2020 10:59   DG CHEST PORT 1 VIEW  Result Date: 10/06/2020 CLINICAL DATA:  Shortness of breath.  History of asthma. EXAM: PORTABLE CHEST 1 VIEW COMPARISON:  PA and lateral chest 12/05/2019. FINDINGS: There is streaky bibasilar atelectasis, greater on the right. No pneumothorax or pleural fluid. Heart size is normal. No acute or focal bony abnormality. IMPRESSION: Bibasilar subsegmental atelectasis, worse on the right. Electronically Signed   By: Drusilla Kanner M.D.   On: 10/06/2020 11:14   CT CYSTOGRAM PELVIS  Addendum Date: 10/06/2020   ADDENDUM REPORT: 10/06/2020 21:23 ADDENDUM: Upon further review there is a focal bladder leak identified arising from the apex of the bladder near its point of fixation with the left retroperitoneum, best appreciated on axial images # 16-23 and sagittal images # 90-93. These results were discussed by telephone at the time of this addendum on 10/06/2020 at 9:23 pm to provider A Shaune Spittle , who verbally acknowledged these results. Electronically Signed   By: Helyn Numbers MD   On: 10/06/2020 21:23   Result Date: 10/06/2020 CLINICAL DATA:  Ascites, abdominal pain. Assess for bladder injury. Remote history of incidental cystotomy. EXAM: CT CYSTOGRAM (CT PELVIS WITH CONTRAST) TECHNIQUE: Multidetector CT imaging through the pelvis was performed after dilute contrast had been introduced into the bladder for the purposes of performing CT cystography. CONTRAST:  41mL OMNIPAQUE IOHEXOL 300 MG/ML  SOLN COMPARISON:  10/05/2020, 11/01/2015 FINDINGS: Urinary Tract: Foley catheter balloon is seen within the bladder lumen.  Retrograde installation of contrast adequately opacifies the bladder lumen. There is no extraluminal extension of contrast to suggest a leak. The bladder wall is thickened and trabeculated, in keeping with changes of chronic bladder outlet obstruction, similar to that seen on both prior examinations. The bladder itself appears fixed into the left retroperitoneum in keeping with surgical changes of a left psoas hitch. The distal ureters are not opacified. Bowel: The visualized large and small bowel are unremarkable. Large volume ascites is again noted. Vascular/Lymphatic: The pelvic vasculature is unremarkable on this noncontrast examination peer Reproductive:  The uterus is absent.  No adnexal masses. Other:  No abdominal wall hernia.  The rectum is unremarkable. Musculoskeletal: The osseous structures are unremarkable. IMPRESSION: No evidence of bladder leak or perforation. Stable diffuse bladder wall thickening and trabeculation in keeping with changes of chronic bladder outlet obstruction. Surgical changes of left psoas hitch again noted. Electronically Signed: By: Helyn Numbers MD On: 10/06/2020 20:20   US Abdomen Limited RUQ (LIVER/GB)  Result  Date: 10/06/2020 CLINICAL DATA:  Evaluate liver and gallbladder, ascites. History of cholecystectomy. EXAM: ULTRASOUND ABDOMEN LIMITED RIGHT UPPER QUADRANT COMPARISON:  Ultrasound abdomen 10/06/2020. FINDINGS: Gallbladder: Status post cholecystectomy. Common bile duct: Diameter: 5 mm. Liver: No focal lesion identified. Within normal limits in parenchymal echogenicity. Portal vein is patent on color Doppler imaging with normal direction of blood flow towards the liver. Other: Interval decrease in now at least small volume free fluid within the upper abdomen is noted. IMPRESSION: Interval decrease in now at least small volume ascites in the upper abdomen. Electronically Signed   By: Tish FredericksonMorgane  Naveau M.D.   On: 10/06/2020 22:06    Assessment: Spontaneous  extraperitoneal bladder perforation, in the area of previous injury and repair, this, despite the normal creatinine.  Not sure what else that could be based on the results of the cells.  The CT scan showed extravasation of contrast from the bladder.  Plan: Foley for 2 weeks (at least) followed by a cystogram in the office prior to removing the catheter.  Will continue to follow.  Berniece SalinesBenjamin Acea Yagi, MD Urology 10/08/2020, 10:37 AM

## 2020-10-08 NOTE — Plan of Care (Signed)
  Problem: Pain Managment: Goal: General experience of comfort will improve Outcome: Progressing   Problem: Safety: Goal: Ability to remain free from injury will improve Outcome: Progressing   

## 2020-10-08 NOTE — Plan of Care (Signed)
  Problem: Education: Goal: Knowledge of General Education information will improve Description Including pain rating scale, medication(s)/side effects and non-pharmacologic comfort measures Outcome: Progressing   

## 2020-10-08 NOTE — Progress Notes (Signed)
PROGRESS NOTE    [name]  KAJ:[id number] DOB: 1975/09/23 DOA: 10/05/2020 PCP: Sheila Felts, FNP   Chief Complaint  Patient presents with  . Abdominal Pain    Brief Narrative:  [name] is [name] 45 y.o. female with medical history significant for obesity, moderate persistent asthma, anemia and GERD who presents with persistent abdominal pain.  She reports having diffuse cramping abdominal pain for the past week as well as dysuria and some urinary hesitancy.  Pain has been constant and worse with ambulation and eating.  Has had decreased p.o. intake and nausea but no vomiting.  Stool has been loose but no diarrhea.  No fevers.  Patient has history of 2 C-section, hysterectomy and cholecystectomy.  Has only occasional alcohol use.  No recent weight loss. No family hx of abdominal cancers or disease on mother's side. Does not know her father.   She presented with the symptoms to urgent care today and had UA showing showing hemoglobin and was asked to present to ED for worsening symptoms.  ED Course: She was hypertensive up to systolic of 150s.  CBC was negative.  Creatinine of 2.85 from prior of 0.88.  LFTs are normal. CT Abdomen shows moderate to large volume ascites within the abdomen and pelvis of uncertain origin.  Bedside ultrasound performed by ED Dr. Myrtis Ser but paracentesis not attempted due to intestines being close to abdominal wall.  Assessment & Plan:   Principal Problem:   Ascites Active Problems:   Obesity (BMI 35.0-39.9 without comorbidity)   Gastroesophageal reflux disease   AKI (acute kidney injury) (HCC)  Abdominal ascites Hx LSO with incidental cystotomy with repair in 01/2011, concern for uroperitoneum Spontaneous Extraperitoneal Bladder Perforation No abnormal LFTs, normal bili, normal platelets, normal INR.  No decreased synthetic function of liver to explain ascites.  AKI, but urine is bland, suggesting against nephrotic syndrome, appears relatively  euvolemic as well C/o abdominal discomfort with urination, hx bladder injury in past -> concerning for uroperitoneum S/p paracentesis by IR -> SAAG>1.1 which would suggest portal hypertension as cause (though normal liver on RUQ [place]), 185 nucleated cells not c/w SBP.  Follow pending pathologist smear review, acid fast smear and culture, pH body fluid, aerobic/anaerobic culture - no growth to date, fungal culture, cytology.  LDH mildly elevated.  Glucose 71. Repeat para 6/4 with 1.9 L out.  Fluid creatinine 0.9.  Cell count not c/w SBP. CT with moderate to large volume ascites within abdomen and pelvis, hazy appearance of omentum and upper abdominal mesentery (edema vs peritoneal disease).  Diffuse wall thickening of bladder with focal wall thickening and calcification or postsurgical change along the left superolateral bladder as before CT cystogram with focal bladder leak identified arising from the apex of the bladder near its point of fixation within L retroperitoneum S/p foley catheter Urology c/s, appreciate assistance - planning for foley x2 weeks followed by cystogram in office prior to removing the catheter.  S/p repeat para by IR on 6/4.   She's c/o pelvic discomfort with foley, will replace and if still uncomfortable, will plan to repeat ascites fluid search.  Acute Kidney Injury  Urinary Retention  NAGMA Bladder scan with 400 cc retained Foley now in place No hydro on CT Suspect 2/2 above UA bland FeNa suggests prerenal etiology Resolved  GERD -continue PPI and famotidine  Moderate persistent asthma -Not in exacerbation.  Continue PRN albuterol  Obesity -BMI > 36. Complicates clinical course  Positive ANA Follows with rheum outpatient (06/21/2020  note from [name])  DVT prophylaxis: SCD Code Status: full  Family Communication: none at bedside Disposition:   Status is: Inpatient  Remains inpatient appropriate because:Inpatient level of care appropriate due to  severity of illness   Dispo: The patient is from: Home              Anticipated d/c is to: Home              Patient currently is not medically stable to d/c.   Difficult to place patient No       Consultants:   urology  Procedures:  none  Antimicrobials: = Anti-infectives (From admission, onward)   None         Subjective: C/o pelvic discomfort from foley  Objective: Vitals:   10/07/20 1345 10/07/20 2156 10/08/20 0527 10/08/20 1346  BP: 130/86 127/85 117/69 124/80  Pulse: 73 78 73 78  Resp: Temp: 98.4 F (36.9 C) 98.6 F (37 C) 98.6 F (37 C) 98.8 F (37.1 C)  TempSrc:  Oral    SpO2: 100% 100% 98% 100%  Weight:      Height:        Intake/Output Summary (Last 24 hours) at 10/08/2020 1827 Last data filed at 10/08/2020 1749 Gross per 24 hour  Intake 820 ml  Output 4700 ml  Net -3880 ml   Filed Weights   10/05/20 1840  Weight: 95.3 kg    Examination:  General: No acute distress. Cardiovascular: Heart sounds show [name] regular rate, and rhythm Lungs: Clear to auscultation bilaterally  Abdomen: Soft, nontender, nondistended  Neurological: Alert and oriented 3. Moves all extremities 4 . Cranial nerves II through XII grossly intact. Skin: Warm and dry. No rashes or lesions. Extremities: No clubbing or cyanosis. No edema.    Data Reviewed: I have personally reviewed following labs and imaging studies  CBC: Recent Labs  Lab 10/05/20 1855 10/05/20 1944 10/05/20 2159 10/06/20 0506 10/07/20 0234 10/08/20 0220  WBC 6.5  --  6.4 6.2 5.2 5.6  NEUTROABS  --   --   --   --  2.9 2.9  HGB 12.4 13.6 12.1 12.3 11.5* 10.1*  HCT 38.8 40.0 37.1 38.6 36.1 31.7*  MCV 80.8  --  80.0 82.0 80.8 81.1  PLT 330  --  306 217 297 274    Basic Metabolic Panel: Recent Labs  Lab 10/05/20 2159 10/06/20 0506 10/06/20 2017 10/07/20 0234 10/08/20 0220  NA 134* 132* 134* 139 139  K 4.1 4.3 4.2 4.1 3.8  CL 102 102 102 106 104  CO2 23 17* GLUCOSE 86 75 66* 86 109*  BUN 23* 26* 25* 15 9  CREATININE 2.85* 3.14* 1.98* 1.04* 0.67  CALCIUM 8.8* 8.6* 8.7* 8.8* 8.7*  MG  --   --   --  2.0 1.7  PHOS  --   --   --  3.8 3.0    GFR: Estimated Creatinine Clearance: 99.4 mL/min (by C-G formula based on SCr of 0.67 mg/dL).  Liver Function Tests: Recent Labs  Lab 10/05/20 1855 10/05/20 2159 10/07/20 0234 10/08/20 0220  AST ALT ALKPHOS 58 56 59 48  BILITOT 0.7 0.9 0.6 0.5  PROT 8.9* 8.0 7.6 6.6  ALBUMIN 4.9 4.6 3.8 3.5    CBG: Recent Labs  Lab 10/05/20 2013  GLUCAP 77     Recent Results (from the past 240 hour(s))  Urine culture     Status: Abnormal   Collection Time: 10/04/20  2:44 PM   Specimen: Urine, Random  Result Value Ref Range Status   Specimen Description URINE, RANDOM  Final   Special Requests NONE  Final   Culture (Shakeila Pfarr)  Final    <10,000 COLONIES/mL INSIGNIFICANT GROWTH Performed at [facility], [address], [place], [place] [address]    Report Status 10/05/2020 FINAL  Final  SARS CORONAVIRUS 2 (TAT 6-24 HRS) Nasopharyngeal Nasopharyngeal Swab     Status: None   Collection Time: 10/05/20  9:59 PM   Specimen: Nasopharyngeal Swab  Result Value Ref Range Status   SARS Coronavirus 2 NEGATIVE NEGATIVE Final    Comment: (NOTE) SARS-CoV-2 target nucleic acids are NOT DETECTED.  The SARS-CoV-2 RNA is generally detectable in upper and lower respiratory specimens during the acute phase of infection. Negative results do not preclude SARS-CoV-2 infection, do not rule out co-infections with other pathogens, and should not be used as the sole basis for treatment or other patient management decisions. Negative results must be combined with clinical observations, patient history, and epidemiological information. The expected result is Negative.  Fact Sheet for Patients: HairSlick.no  Fact Sheet for Healthcare  Providers: quierodirigir.com  This test is not yet approved or cleared by the [place] FDA and  has been authorized for detection and/or diagnosis of SARS-CoV-2 by FDA under an Emergency Use Authorization (EUA). This EUA will remain  in effect (meaning this test can be used) for the duration of the COVID-19 declaration under Se ction 564(b)(1) of the Act, 21 U.S.C. section 360bbb-3(b)(1), unless the authorization is terminated or revoked sooner.  Performed at [facility], [address], [place], [place] [address]   Aerobic/Anaerobic Culture w Gram Stain (surgical/deep wound)     Status: None (Preliminary result)   Collection Time: 10/06/20 11:04 AM   Specimen: PATH Cytology Peritoneal fluid  Result Value Ref Range Status   Specimen Description   Final    PERITONEAL Performed at [facility], [address], [place], [place] [address]    Special Requests   Final    NONE Performed at [facility], [address] [place], [place] [address]    Gram Stain   Final    WBC PRESENT, PREDOMINANTLY MONONUCLEAR NO ORGANISMS SEEN CYTOSPIN SMEAR    Culture   Final    NO GROWTH 2 DAYS NO ANAEROBES ISOLATED; CULTURE IN PROGRESS FOR 5 DAYS Performed at [facility], [address], [place], [place] [address]    Report Status PENDING  Incomplete         Radiology Studies: US Paracentesis  Result Date: 10/07/2020 INDICATION: Patient with history of abdominal pain, AKI, extraperitoneal bladder perforation, abdominal distension, and recurrent ascites. Request is made for diagnostic and therapeutic paracentesis. EXAM: ULTRASOUND GUIDED DIAGNOSTIC AND THERAPEUTIC PARACENTESIS MEDICATIONS: 10 mL 1% lidocaine COMPLICATIONS: None immediate. PROCEDURE: Informed written consent was obtained from the patient after Jlyn Cerros discussion of the risks, benefits and alternatives to treatment. Unique [place] timeout was performed prior to the  initiation of the procedure. Initial ultrasound scanning demonstrates Cadell Gabrielson large amount of ascites within the right lower abdominal quadrant. The right lower abdomen was prepped and draped in the usual sterile fashion. 1% lidocaine was used for local anesthesia. Following this, Marcos Ruelas 19 gauge, 10-cm, Yueh catheter was introduced. An ultrasound image was saved for documentation purposes. The paracentesis was performed. The catheter was removed and Tylar Merendino  dressing was applied. The patient tolerated the procedure well without immediate post procedural complication. FINDINGS: Chenee Munns total of approximately 1.9 L of clear yellow fluid was removed. Samples were sent to the laboratory as requested by the clinical team. IMPRESSION: Successful ultrasound-guided paracentesis yielding 1.9 L of peritoneal fluid. Read by: Elwin Mocha, PA-C Electronically Signed   By: Irish Lack M.D.   On: 10/07/2020 14:23   CT CYSTOGRAM PELVIS  Addendum Date: 10/06/2020   ADDENDUM REPORT: 10/06/2020 21:23 ADDENDUM: Upon further review there is [place] Greb focal bladder leak identified arising from the apex of the bladder near its point of fixation with the left retroperitoneum, best appreciated on axial images # 16-23 and sagittal images # 90-93. These results were discussed by telephone at the time of this addendum on 10/06/2020 at 9:23 pm to provider Jazlene Bares Shaune Spittle , who verbally acknowledged these results. Electronically Signed   By: Helyn Numbers MD   On: 10/06/2020 21:23   Result Date: 10/06/2020 CLINICAL DATA:  Ascites, abdominal pain. Assess for bladder injury. Remote history of incidental cystotomy. EXAM: CT CYSTOGRAM (CT PELVIS WITH CONTRAST) TECHNIQUE: Multidetector CT imaging through the pelvis was performed after dilute contrast had been introduced into the bladder for the purposes of performing CT cystography. CONTRAST:  109mL OMNIPAQUE IOHEXOL 300 MG/ML  SOLN COMPARISON:  10/05/2020, 11/01/2015 FINDINGS: Urinary Tract: Foley catheter balloon is seen  within the bladder lumen. Retrograde installation of contrast adequately opacifies the bladder lumen. There is no extraluminal extension of contrast to suggest Bren Steers leak. The bladder wall is thickened and trabeculated, in keeping with changes of chronic bladder outlet obstruction, similar to that seen on both prior examinations. The bladder itself appears fixed into the left retroperitoneum in keeping with surgical changes of Jamarien Rodkey left psoas hitch. The distal ureters are not opacified. Bowel: The visualized large and small bowel are unremarkable. Large volume ascites is again noted. Vascular/Lymphatic: The pelvic vasculature is unremarkable on this noncontrast examination peer Reproductive:  The uterus is absent.  No adnexal masses. Other:  No abdominal wall hernia.  The rectum is unremarkable. Musculoskeletal: The osseous structures are unremarkable. IMPRESSION: No evidence of bladder leak or perforation. Stable diffuse bladder wall thickening and trabeculation in keeping with changes of chronic bladder outlet obstruction. Surgical changes of left psoas hitch again noted. Electronically Signed: By: Helyn Numbers MD On: 10/06/2020 20:20   US Abdomen Limited RUQ (LIVER/GB)  Result Date: 10/06/2020 CLINICAL DATA:  Evaluate liver and gallbladder, ascites. History of cholecystectomy. EXAM: ULTRASOUND ABDOMEN LIMITED RIGHT UPPER QUADRANT COMPARISON:  Ultrasound abdomen 10/06/2020. FINDINGS: Gallbladder: Status post cholecystectomy. Common bile duct: Diameter: 5 mm. Liver: No focal lesion identified. Within normal limits in parenchymal echogenicity. Portal vein is patent on color Doppler imaging with normal direction of blood flow towards the liver. Other: Interval decrease in now at least small volume free fluid within the upper abdomen is noted. IMPRESSION: Interval decrease in now at least small volume ascites in the upper abdomen. Electronically Signed   By: Tish Frederickson M.D.   On: 10/06/2020 22:06         Scheduled Meds: . acetaminophen  1,000 mg Oral Q8H  . Chlorhexidine Gluconate Cloth  6 each Topical Daily  . diclofenac Sodium  2 g Topical QID  . famotidine  20 mg Oral Daily  . pantoprazole  40 mg Oral Daily   Continuous Infusions:    LOS: 2 days    Time spent: over 30 min    Lacretia Nicks, MD  [facility]   To contact the attending provider between 7A-7P or the covering provider during after hours 7P-7A, please log into the web site [email or web address] and access using universal [place] password for that web site. If you do not have the password, please call the hospital operator.  10/08/2020, 6:27 PM

## 2020-10-08 NOTE — Progress Notes (Signed)
New foley put in per verbal order from Dr. Lowell Guitar, MD due to patient's c/o discomfort. Patient still uncomfortable after new foley put in. Made Dr. Lowell Guitar, MD aware and he put in order for [place] Ascites (Abdomen). Per Dr. Lowell Guitar, MD, patient will continue to be monitored. Patient not ready for discharge at this moment.

## 2020-10-09 LAB — CYTOLOGY - NON PAP

## 2020-10-09 LAB — ACID FAST SMEAR (AFB, MYCOBACTERIA): Acid Fast Smear: NEGATIVE

## 2020-10-09 MED ORDER — DICLOFENAC SODIUM 1 % EX GEL: 2.0000 g | Freq: Four times a day (QID) | CUTANEOUS | 0 refills | Status: DC | PRN

## 2020-10-09 MED ORDER — POLYETHYLENE GLYCOL 3350 17 G PO PACK
17.0000 g | PACK | Freq: Every day | ORAL | Status: DC
  Administered 2020-10-09: 17 g via ORAL
  Filled 2020-10-09: qty 1

## 2020-10-09 NOTE — Discharge Summary (Signed)
Physician Discharge Summary  Philisha Weinel Boen UJW:[id number] DOB: 1976/02/17 DOA: 10/05/2020  PCP: [name], FNP  Admit date: 10/05/2020 Discharge date: 10/09/2020  Time spent: 40 minutes  Recommendations for Outpatient Follow-up:  1. Follow outpatient CBC/CMP 2. Follow cytology, pathology review smear, fungal culture, acid fast culture, aerobic/anaerobic cultures (pending studies from para) 3. Follow pending para studies 4. Follow urology outpatient  5. Imaging with hazy appearance of omentum/upper abdominal mesentery  - 2/2 edema vs peritoneal disease - recommended diagnostic para (results pending), consider follow up imaging/additional evaluation   Discharge Diagnoses:  Principal Problem:   Ascites Active Problems:   Obesity (BMI 35.0-39.9 without comorbidity)   Gastroesophageal reflux disease   AKI (acute kidney injury) (HCC)   Discharge Condition: stable  Diet recommendation: heart healthy  Filed Weights   10/05/20 1840  Weight: 95.3 kg    History of present illness:  [name] 45 y.o.femalewith medical history significant forobesity, moderate persistent asthma, anemia and GERD who presents with persistent abdominal pain.  She was admitted and found to have spontaneous extraperitoneal bladder perforation.  She had para x2 to remove ascites fluid which was thought to be related to bladder perforation.  Urology recommended foley and outpatient follow up.  See below for additional details   Hospital Course:  Abdominal ascites Hx LSO with incidental cystotomy with repair in 01/2011, concern for uroperitoneum Spontaneous Extraperitoneal Bladder Perforation No abnormal LFTs, normal bili, normal platelets, normal INR.  No decreased synthetic function of liver to explain ascites.  AKI, but urine is bland, suggesting against nephrotic syndrome, appears relatively euvolemic as well C/o abdominal discomfort with urination, hx bladder injury in past -> concerning  for uroperitoneum S/p paracentesis by IR -> SAAG>1.1 which would suggest portal hypertension as cause (though normal liver on RUQ [place]), 185 nucleated cells not c/w SBP.  Follow pending pathologist smear review, acid fast smear (negative) and culture, pH body fluid (7.5), aerobic/anaerobic culture - no growth to date (culture in progres for 5 days), fungal culture, cytology.  LDH mildly elevated.  Glucose 71. Repeat para 6/4 with 1.9 L out.  Fluid creatinine 0.9 which is lower than would be expected with uroperitoneum, but overall picture with bladder perf concerning for this.  Cell count not c/w SBP. CT with moderate to large volume ascites within abdomen and pelvis, hazy appearance of omentum and upper abdominal mesentery (edema vs peritoneal disease).  Diffuse wall thickening of bladder with focal wall thickening and calcification or postsurgical change along the left superolateral bladder as before CT cystogram with focal bladder leak identified arising from the apex of the bladder near its point of fixation within L retroperitoneum S/p foley catheter Urology c/s, appreciate assistance - planning for foley x2 weeks followed by cystogram in office prior to removing the catheter.  S/p repeat para by IR on 6/4.   Foley replaced 6/5 due to discomfort - some still present on 6/6, will plan to follow outpatient Repeat US ascites search without ascites  Acute Kidney Injury  Urinary Retention  NAGMA Bladder scan with 400 cc retained Foley now in place No hydro on CT Suspect 2/2 above UA bland FeNa suggests prerenal etiology Resolved  GERD -continue PPI and famotidine  Moderate persistent asthma -Not in exacerbation. Continue PRN albuterol  Obesity -BMI > 36. Complicates clinical course  Positive ANA Follows with rheum outpatient (06/21/2020 note from Dr. Corliss Skains)  Procedures:  Paracentesis 6/3 IMPRESSION: Successful ultrasound-guided diagnostic paracentesis yielding 210 cc of  peritoneal fluid.  Paracentesis 6/4 IMPRESSION: Successful ultrasound-guided paracentesis yielding 1.9 L of peritoneal fluid.  Consultations:  Urology  IR  Discharge Exam: Vitals:   10/08/20 2223 10/09/20 0525  BP: 124/79 125/81  Pulse: 68 68  Resp: 16 16  Temp: 99.1 F (37.3 C) 98 F (36.7 C)  SpO2: 100% 97%   Feels better Still some foley discomfort, but comfortable with discharge and following outpatient  General: No acute distress. Cardiovascular:RRR Lungs: unlabored Abdomen: Soft, nontender, nondistended Foley catheter Neurological: Alert and oriented 3. Moves all extremities 4. Cranial nerves II through XII grossly intact. Extremities: No clubbing or cyanosis. No edema.   Discharge Instructions   Discharge Instructions    Call MD for:  difficulty breathing, headache or visual disturbances   Complete by: As directed    Call MD for:  extreme fatigue   Complete by: As directed    Call MD for:  hives   Complete by: As directed    Call MD for:  persistant dizziness or light-headedness   Complete by: As directed    Call MD for:  persistant nausea and vomiting   Complete by: As directed    Call MD for:  redness, tenderness, or signs of infection (pain, swelling, redness, odor or green/yellow discharge around incision site)   Complete by: As directed    Call MD for:  severe uncontrolled pain   Complete by: As directed    Call MD for:  temperature >100.4   Complete by: As directed    Diet - low sodium heart healthy   Complete by: As directed    Diet - low sodium heart healthy   Complete by: As directed    Discharge instructions   Complete by: As directed    You were seen for ascites (fluid in your abdomen) and were found to have Megon Kalina perforated bladder (hole in the bladder).    You were seen by urology who recommended Malashia Kamaka foley catheter and outpatient follow up in Em[place] Lavey couple of weeks.    You had ascites fluid studies that were sent, we think the fluid was  urine, but the labs did not fully support that.  You have pending labs including pathology smear review, acid fast smear and culture, body fluid pH, fungal culture, and cytology that are pending.  Please follow these results up with your PCP.  You may need repeat imaging at some time in the future.  Return for new, recurrent, or worsening symptoms.  Please ask your PCP to request records from this hospitalization so they know what was done and what the next steps will be.   Increase activity slowly   Complete by: As directed    Increase activity slowly   Complete by: As directed      Allergies as of 10/09/2020      Reactions   Aspirin    Ibuprofen    Salmon [fish Allergy]    Zoledronic Acid Other (See Comments)      Medication List    STOP taking these medications   naproxen 500 MG tablet Commonly known as: NAPROSYN     TAKE these medications   acetaminophen 500 MG tablet Commonly known as: TYLENOL Take 1,000 mg by mouth every 6 (six) hours as needed for moderate pain.   albuterol 108 (90 Base) MCG/ACT inhaler Commonly known as: VENTOLIN HFA Inhale 2 puffs into the lungs every 6 (six) hours as needed for wheezing or shortness of breath.   Breo Ellipta 100-25 MCG/INH Aepb Generic drug:  fluticasone furoate-vilanterol Inhale 1 puff into the lungs daily as needed.   calcium carbonate 1250 (500 Ca) MG tablet Commonly known as: OS-CAL - dosed in mg of elemental calcium Take 1 tablet by mouth.   cetirizine 10 MG tablet Commonly known as: ZYRTEC TAKE 1 TABLET (10 MG TOTAL) BY MOUTH DAILY AS NEEDED FOR RHINITIS.   clindamycin 1 % lotion Commonly known as: CLEOCIN T Apply 1 application topically daily.   diclofenac Sodium 1 % Gel Commonly known as: VOLTAREN Apply 2 g topically 4 (four) times daily as needed (musculoskeletal pain).   EPINEPHrine 0.3 mg/0.3 mL Soaj injection Commonly known as: EpiPen 2-Pak Inject 0.3 mg into the muscle as needed for anaphylaxis. What  changed: when to take this   esomeprazole 40 MG capsule Commonly known as: NEXIUM TAKE 1 CAPSULE (40 MG TOTAL) BY MOUTH DAILY AT 12 NOON.   famotidine 20 MG tablet Commonly known as: PEPCID Take 20 mg by mouth daily.   HYDROcodone-acetaminophen 5-325 MG tablet Commonly known as: NORCO/VICODIN Take 1 tablet by mouth every 6 (six) hours as needed for severe pain.   ondansetron 4 MG tablet Commonly known as: Zofran Take 1 tablet (4 mg total) by mouth daily as needed for nausea or vomiting.   Restasis 0.05 % ophthalmic emulsion Generic drug: cycloSPORINE Place 1 drop into both eyes 2 (two) times daily.   sucralfate 1 g tablet Commonly known as: CARAFATE TAKE 1 TABLET (1 G TOTAL) BY MOUTH EVERY 6 (SIX) HOURS. SLOWLY DISSOLVE TABLET IN 1 TABLESPOON OF DISTILLED WATER BEFORE INGESTION   tiZANidine 4 MG tablet Commonly known as: Zanaflex Take 1 tablet (4 mg total) by mouth every 6 (six) hours as needed for muscle spasms.   Vitamin D (Ergocalciferol) 1.25 MG (50000 UNIT) Caps capsule Commonly known as: DRISDOL 1 po q 2 wks What changed:   how much to take  how to take this  when to take this  additional instructions   zolpidem 12.5 MG CR tablet Commonly known as: AMBIEN CR Take 12.5 mg by mouth at bedtime as needed for sleep.      Allergies  Allergen Reactions  . Aspirin   . Ibuprofen   . Salmon [Fish Allergy]   . Zoledronic Acid Other (See Comments)      The results of significant diagnostics from this hospitalization (including imaging, microbiology, ancillary and laboratory) are listed below for reference.    Significant Diagnostic Studies: CT ABDOMEN PELVIS WO CONTRAST  Result Date: 10/05/2020 CLINICAL DATA:  Abdomen pain EXAM: CT ABDOMEN AND PELVIS WITHOUT CONTRAST TECHNIQUE: Multidetector CT imaging of the abdomen and pelvis was performed following the standard protocol without IV contrast. COMPARISON:  11/01/2015 CT, MRI 02/10/2017 FINDINGS: Lower chest:  Lung bases demonstrate streaky atelectasis in the lower lobes and right middle lobe. No significant pleural effusion. Normal cardiac size. Hepatobiliary: No focal hepatic abnormality. Status post cholecystectomy. No biliary dilatation. Pancreas: Unremarkable. No pancreatic ductal dilatation or surrounding inflammatory changes. Spleen: Normal in size without focal abnormality. Adrenals/Urinary Tract: Adrenal glands are within normal limits. Kidneys show no hydronephrosis. Dilated urinary bladder with wall thickening. Focal wall thickening and calcification or surgical change along the superolateral aspect of the left bladder similar as compared with 2017 exam. Stomach/Bowel: The stomach is nonenlarged. No dilated small bowel. No acute bowel wall thickening Vascular/Lymphatic: Nonaneurysmal aorta.  No suspicious nodes Reproductive: Status post hysterectomy. No adnexal masses. Other: No free air. Moderate to large volume of ascites within the abdomen and pelvis. Stranding  or mild infiltrated appearance of the omentum and upper abdominal mesentery. Musculoskeletal: No acute or suspicious osseous abnormality. IMPRESSION: 1. Moderate to large volume of ascites within the abdomen and pelvis of uncertain source. Hazy appearance of the omentum and upper abdominal mesentery, uncertain if this is due to edema or peritoneal disease. Consider correlation with diagnostic paracentesis. 2. Diffuse wall thickening of the bladder with focal wall thickening and calcification or postsurgical change along the left superolateral bladder as before. Electronically Signed   By: Jasmine Pang M.D.   On: 10/05/2020 21:03   US Abdomen Limited  Result Date: 10/06/2020 CLINICAL DATA:  Abdominal distension EXAM: LIMITED ABDOMEN ULTRASOUND FOR ASCITES TECHNIQUE: Limited ultrasound survey for ascites was performed in all four abdominal quadrants. COMPARISON:  None. FINDINGS: There is moderate ascites throughout the abdomen and pelvis. Loops of  peristalsing bowel noted. IMPRESSION: Moderate ascites throughout abdomen and pelvis noted. Electronically Signed   By: Bretta Bang III M.D.   On: 10/06/2020 15:10   US Paracentesis  Result Date: 10/07/2020 INDICATION: Patient with history of abdominal pain, AKI, extraperitoneal bladder perforation, abdominal distension, and recurrent ascites. Request is made for diagnostic and therapeutic paracentesis. EXAM: ULTRASOUND GUIDED DIAGNOSTIC AND THERAPEUTIC PARACENTESIS MEDICATIONS: 10 mL 1% lidocaine COMPLICATIONS: None immediate. PROCEDURE: Informed written consent was obtained from the patient after Austynn Pridmore discussion of the risks, benefits and alternatives to treatment. Crit Obremski timeout was performed prior to the initiation of the procedure. Initial ultrasound scanning demonstrates Omolola Mittman large amount of ascites within the right lower abdominal quadrant. The right lower abdomen was prepped and draped in the usual sterile fashion. 1% lidocaine was used for local anesthesia. Following this, Lataja Newland 19 gauge, 10-cm, Yueh catheter was introduced. An ultrasound image was saved for documentation purposes. The paracentesis was performed. The catheter was removed and Berenise Hunton dressing was applied. The patient tolerated the procedure well without immediate post procedural complication. FINDINGS: Toryn Dewalt total of approximately 1.9 L of clear yellow fluid was removed. Samples were sent to the laboratory as requested by the clinical team. IMPRESSION: Successful ultrasound-guided paracentesis yielding 1.9 L of peritoneal fluid. Read by: Elwin Mocha, PA-C Electronically Signed   By: Irish Lack M.D.   On: 10/07/2020 14:23   US Paracentesis  Result Date: 10/06/2020 INDICATION: Patient with history of abdominal pain, dysuria, acute kidney injury, GERD, ascites. Request received for diagnostic paracentesis. EXAM: ULTRASOUND GUIDED DIAGNOSTIC PARACENTESIS MEDICATIONS: 1% lidocaine to skin and subcutaneous tissue COMPLICATIONS: None immediate.  PROCEDURE: Informed written consent was obtained from the patient after Prashant Glosser discussion of the risks, benefits and alternatives to treatment. Husein Guedes timeout was performed prior to the initiation of the procedure. Initial ultrasound scanning demonstrates Nathan Stallworth moderate to large amount of ascites within the left mid to lower abdominal quadrant. The left mid to lower abdomen was prepped and draped in the usual sterile fashion. 1% lidocaine was used for local anesthesia. Following this, Meigan Pates 19 gauge, 10-cm, Yueh catheter was introduced. An ultrasound image was saved for documentation purposes. The paracentesis was performed. The catheter was removed and Jerald Villalona dressing was applied. The patient tolerated the procedure well without immediate post procedural complication. FINDINGS: Merryn Thaker total of approximately 210 cc of very light yellow (essentially clear, colorless) fluid was removed. Samples were sent to the laboratory as requested by the clinical team. IMPRESSION: Successful ultrasound-guided diagnostic paracentesis yielding 210 cc of peritoneal fluid. Read by: Jeananne Rama, PA-C Electronically Signed   By: Simonne Come M.D.   On: 10/06/2020 10:59   DG CHEST PORT  1 VIEW  Result Date: 10/06/2020 CLINICAL DATA:  Shortness of breath.  History of asthma. EXAM: PORTABLE CHEST 1 VIEW COMPARISON:  PA and lateral chest 12/05/2019. FINDINGS: There is streaky bibasilar atelectasis, greater on the right. No pneumothorax or pleural fluid. Heart size is normal. No acute or focal bony abnormality. IMPRESSION: Bibasilar subsegmental atelectasis, worse on the right. Electronically Signed   By: Drusilla Kanner M.D.   On: 10/06/2020 11:14   [place] ASCITES (ABDOMEN LIMITED)  Result Date: 10/08/2020 CLINICAL DATA:  Ascites. EXAM: LIMITED ABDOMEN ULTRASOUND FOR ASCITES TECHNIQUE: Limited ultrasound survey for ascites was performed in all four abdominal quadrants. COMPARISON:  CT abdomen dated 10/05/2020. FINDINGS: No ascites is demonstrated. Some of the deeper  portions of the abdomen are likely obscured by overlying bowel gas. IMPRESSION: No ascites identified. Electronically Signed   By: Bary Richard M.D.   On: 10/08/2020 21:37   CT CYSTOGRAM PELVIS  Addendum Date: 10/06/2020   ADDENDUM REPORT: 10/06/2020 21:23 ADDENDUM: Upon further review there is Spring San focal bladder leak identified arising from the apex of the bladder near its point of fixation with the left retroperitoneum, best appreciated on axial images # 16-23 and sagittal images # 90-93. These results were discussed by telephone at the time of this addendum on 10/06/2020 at 9:23 pm to provider Ruthellen Tippy Shaune Spittle , who verbally acknowledged these results. Electronically Signed   By: Helyn Numbers MD   On: 10/06/2020 21:23   Result Date: 10/06/2020 CLINICAL DATA:  Ascites, abdominal pain. Assess for bladder injury. Remote history of incidental cystotomy. EXAM: CT CYSTOGRAM (CT PELVIS WITH CONTRAST) TECHNIQUE: Multidetector CT imaging through the pelvis was performed after dilute contrast had been introduced into the bladder for the purposes of performing CT cystography. CONTRAST:  37mL OMNIPAQUE IOHEXOL 300 MG/ML  SOLN COMPARISON:  10/05/2020, 11/01/2015 FINDINGS: Urinary Tract: Foley catheter balloon is seen within the bladder lumen. Retrograde installation of contrast adequately opacifies the bladder lumen. There is no extraluminal extension of contrast to suggest Teeghan Hammer leak. The bladder wall is thickened and trabeculated, in keeping with changes of chronic bladder outlet obstruction, similar to that seen on both prior examinations. The bladder itself appears fixed into the left retroperitoneum in keeping with surgical changes of Glenden Rossell left psoas hitch. The distal ureters are not opacified. Bowel: The visualized large and small bowel are unremarkable. Large volume ascites is again noted. Vascular/Lymphatic: The pelvic vasculature is unremarkable on this noncontrast examination peer Reproductive:  The uterus is absent.  No  adnexal masses. Other:  No abdominal wall hernia.  The rectum is unremarkable. Musculoskeletal: The osseous structures are unremarkable. IMPRESSION: No evidence of bladder leak or perforation. Stable diffuse bladder wall thickening and trabeculation in keeping with changes of chronic bladder outlet obstruction. Surgical changes of left psoas hitch again noted. Electronically Signed: By: Helyn Numbers MD On: 10/06/2020 20:20   US Abdomen Limited RUQ (LIVER/GB)  Result Date: 10/06/2020 CLINICAL DATA:  Evaluate liver and gallbladder, ascites. History of cholecystectomy. EXAM: ULTRASOUND ABDOMEN LIMITED RIGHT UPPER QUADRANT COMPARISON:  Ultrasound abdomen 10/06/2020. FINDINGS: Gallbladder: Status post cholecystectomy. Common bile duct: Diameter: 5 mm. Liver: No focal lesion identified. Within normal limits in parenchymal echogenicity. Portal vein is patent on color Doppler imaging with normal direction of blood flow towards the liver. Other: Interval decrease in now at least small volume free fluid within the upper abdomen is noted. IMPRESSION: Interval decrease in now at least small volume ascites in the upper abdomen. Electronically Signed   By:  Tish Frederickson M.D.   On: 10/06/2020 22:06    Microbiology: Recent Results (from the past 240 hour(s))  Urine culture     Status: Abnormal   Collection Time: 10/04/20  2:44 PM   Specimen: Urine, Random  Result Value Ref Range Status   Specimen Description URINE, RANDOM  Final   Special Requests NONE  Final   Culture (Neleh Muldoon)  Final    <10,000 COLONIES/mL INSIGNIFICANT GROWTH Performed at [facility], [address], [place], [place] [address]    Report Status 10/05/2020 FINAL  Final  SARS CORONAVIRUS 2 (TAT 6-24 HRS) Nasopharyngeal Nasopharyngeal Swab     Status: None   Collection Time: 10/05/20  9:59 PM   Specimen: Nasopharyngeal Swab  Result Value Ref Range Status   SARS Coronavirus 2 NEGATIVE NEGATIVE Final    Comment: (NOTE) SARS-CoV-2 target  nucleic acids are NOT DETECTED.  The SARS-CoV-2 RNA is generally detectable in upper and lower respiratory specimens during the acute phase of infection. Negative results do not preclude SARS-CoV-2 infection, do not rule out co-infections with other pathogens, and should not be used as the sole basis for treatment or other patient management decisions. Negative results must be combined with clinical observations, patient history, and epidemiological information. The expected result is Negative.  Fact Sheet for Patients: HairSlick.no  Fact Sheet for Healthcare Providers: quierodirigir.com  This test is not yet approved or cleared by the [place] FDA and  has been authorized for detection and/or diagnosis of SARS-CoV-2 by FDA under an Emergency Use Authorization (EUA). This EUA will remain  in effect (meaning this test can be used) for the duration of the COVID-19 declaration under Se ction 564(b)(1) of the Act, 21 U.S.C. section 360bbb-3(b)(1), unless the authorization is terminated or revoked sooner.  Performed at [facility], [address], [place], [place] [address]   Aerobic/Anaerobic Culture w Gram Stain (surgical/deep wound)     Status: None (Preliminary result)   Collection Time: 10/06/20 11:04 AM   Specimen: PATH Cytology Peritoneal fluid  Result Value Ref Range Status   Specimen Description   Final    PERITONEAL Performed at [facility], [address], [place], [place] [address]    Special Requests   Final    NONE Performed at [facility], [address] [place], [place] [address]    Gram Stain   Final    WBC PRESENT, PREDOMINANTLY MONONUCLEAR NO ORGANISMS SEEN CYTOSPIN SMEAR    Culture   Final    NO GROWTH 2 DAYS NO ANAEROBES ISOLATED; CULTURE IN PROGRESS FOR 5 DAYS Performed at [facility], [address], [place], [place] [address]    Report  Status PENDING  Incomplete  Acid Fast Smear (AFB)     Status: None   Collection Time: 10/06/20 11:04 AM   Specimen: PATH Cytology Peritoneal fluid  Result Value Ref Range Status   AFB Specimen Processing Concentration  Final   Acid Fast Smear Negative  Final    Comment: (NOTE) Performed At: [facility] [address] [place], [place] [id number] Jolene Schimke MD [id number]    Source (AFB) PERITONEAL  Final    Comment: Performed at [facility], [address] [place], [place] [address]     Labs: Basic Metabolic Panel: Recent Labs  Lab 10/05/20 2159 10/06/20 0506 10/06/20 2017 10/07/20 0234 10/08/20 0220  NA 134* 132* 134* 139 139  K 4.1 4.3 4.2 4.1 3.8  CL 102 102 102 106 104  CO2 23 17* GLUCOSE 86 75 66* 86 109*  BUN 23* 26* 25* 15 9  CREATININE 2.85* 3.14* 1.98* 1.04* 0.67  CALCIUM 8.8* 8.6* 8.7* 8.8* 8.7*  MG  --   --   --  2.0 1.7  PHOS  --   --   --  3.8 3.0   Liver Function Tests: Recent Labs  Lab 10/05/20 1855 10/05/20 2159 10/07/20 0234 10/08/20 0220  AST ALT ALKPHOS 58 56 59 48  BILITOT 0.7 0.9 0.6 0.5  PROT 8.9* 8.0 7.6 6.6  ALBUMIN 4.9 4.6 3.8 3.5   Recent Labs  Lab 10/05/20 1855  LIPASE 23   No results for input(s): AMMONIA in the last 168 hours. CBC: Recent Labs  Lab 10/05/20 1855 10/05/20 1944 10/05/20 2159 10/06/20 0506 10/07/20 0234 10/08/20 0220  WBC 6.5  --  6.4 6.2 5.2 5.6  NEUTROABS  --   --   --   --  2.9 2.9  HGB 12.4 13.6 12.1 12.3 11.5* 10.1*  HCT 38.8 40.0 37.1 38.6 36.1 31.7*  MCV 80.8  --  80.0 82.0 80.8 81.1  PLT 330  --  306 217 297 274   Cardiac Enzymes: No results for input(s): CKTOTAL, CKMB, CKMBINDEX, TROPONINI in the last 168 hours. BNP: BNP (last 3 results) No results for input(s): BNP in the last 8760 hours.  ProBNP (last 3 results) No results for input(s): PROBNP in the last 8760 hours.  CBG: Recent Labs  Lab 10/05/20 2013   GLUCAP 77       Signed:  Lacretia Nicks MD.  [facility] 10/09/2020, 9:37 AM

## 2020-10-10 ENCOUNTER — Other Ambulatory Visit: Payer: Self-pay | Admitting: Urology

## 2020-10-10 DIAGNOSIS — S3729XA Other injury of bladder, initial encounter: Secondary | ICD-10-CM

## 2020-10-10 LAB — PATHOLOGIST SMEAR REVIEW

## 2020-10-11 ENCOUNTER — Telehealth: Payer: Self-pay

## 2020-10-11 LAB — AEROBIC/ANAEROBIC CULTURE W GRAM STAIN (SURGICAL/DEEP WOUND): Culture: NO GROWTH

## 2020-10-11 NOTE — Telephone Encounter (Signed)
Transition Care Management Follow-up Telephone Call  Date of discharge and from where: 10/09/2020 [facility]  How have you been since you were released from the hospital? Pretty good discomfort with the catheter and back pain  Any questions or concerns? No  Items Reviewed:  Did the pt receive and understand the discharge instructions provided? Yes   Medications obtained and verified? Yes   Other? No   Any new allergies since your discharge? No   Dietary orders reviewed? Yes  Do you have support at home? Yes   Home Care and Equipment/Supplies: Were home health services ordered? no If so, what is the name of the agency? n/a  Has the agency set up a time to come to the patient's home? not applicable Were any new equipment or medical supplies ordered?  No What is the name of the medical supply agency? n/a Were you able to get the supplies/equipment? not applicable Do you have any questions related to the use of the equipment or supplies? No  Functional Questionnaire: (I = Independent and D = Dependent) ADLs: I  Bathing/Dressing- I  Meal Prep- I  Eating- I  Maintaining continence- I  Transferring/Ambulation- I  Managing Meds- I  Follow up appointments reviewed:   PCP Hospital f/u appt confirmed? Yes  Scheduled to see Raman on 10/18/2020 @ 4:00p.  Are transportation arrangements needed? No   If their condition worsens, is the pt aware to call PCP or go to the Emergency Dept.? Yes  Was the patient provided with contact information for the PCP's office or ED? Yes  Was to pt encouraged to call back with questions or concerns? Yes

## 2020-10-17 ENCOUNTER — Emergency Department (HOSPITAL_COMMUNITY)
Admission: EM | Admit: 2020-10-17 | Discharge: 2020-10-17 | Disposition: A | Discharge status: Home / Self Care | Payer: 59 | Attending: Emergency Medicine | Admitting: Emergency Medicine

## 2020-10-17 ENCOUNTER — Emergency Department (HOSPITAL_COMMUNITY): Payer: 59

## 2020-10-17 ENCOUNTER — Other Ambulatory Visit: Payer: Self-pay

## 2020-10-17 DIAGNOSIS — R1013 Epigastric pain: Secondary | ICD-10-CM | POA: Diagnosis present

## 2020-10-17 DIAGNOSIS — R0602 Shortness of breath: Secondary | ICD-10-CM | POA: Diagnosis not present

## 2020-10-17 DIAGNOSIS — Z5321 Procedure and treatment not carried out due to patient leaving prior to being seen by health care provider: Secondary | ICD-10-CM | POA: Diagnosis not present

## 2020-10-17 LAB — COMPREHENSIVE METABOLIC PANEL
ALT: 23 U/L (ref 0–44)
AST: 24 U/L (ref 15–41)
Albumin: 4.2 g/dL (ref 3.5–5.0)
Alkaline Phosphatase: 44 U/L (ref 38–126)
Anion gap: 8 (ref 5–15)
BUN: 9 mg/dL (ref 6–20)
CO2: 29 mmol/L (ref 22–32)
Calcium: 9.1 mg/dL (ref 8.9–10.3)
Chloride: 102 mmol/L (ref 98–111)
Creatinine, Ser: 0.75 mg/dL (ref 0.44–1.00)
GFR, Estimated: >60 mL/min (ref >60)
Glucose, Bld: 108 mg/dL — ABNORMAL HIGH (ref 70–99)
Potassium: 4.2 mmol/L (ref 3.5–5.1)
Sodium: 139 mmol/L (ref 135–145)
Total Bilirubin: 0.1 mg/dL — ABNORMAL LOW (ref 0.3–1.2)
Total Protein: 7.9 g/dL (ref 6.5–8.1)

## 2020-10-17 LAB — CBC
HCT: 34.2 % — ABNORMAL LOW (ref 36.0–46.0)
Hemoglobin: 10.7 g/dL — ABNORMAL LOW (ref 12.0–15.0)
MCH: 26 pg (ref 26.0–34.0)
MCHC: 31.3 g/dL (ref 30.0–36.0)
MCV: 83 fL (ref 80.0–100.0)
Platelets: 323 10*3/uL (ref 150–400)
RBC: 4.12 MIL/uL (ref 3.87–5.11)
RDW: 12.8 % (ref 11.5–15.5)
WBC: 7.2 10*3/uL (ref 4.0–10.5)
nRBC: 0 % (ref 0.0–0.2)

## 2020-10-17 LAB — I-STAT BETA HCG BLOOD, ED (NOT ORDERABLE): I-stat hCG, quantitative: <5 m[IU]/mL (ref <5)

## 2020-10-17 LAB — LIPASE, BLOOD: Lipase: 33 U/L (ref 11–51)

## 2020-10-17 NOTE — ED Notes (Signed)
Pt. Called x3 for vital recheck. No response from pt. Nurse aware.

## 2020-10-17 NOTE — ED Triage Notes (Signed)
Pt came in with c/o epigastric abdominal pain. Seen here recently and admitted for AKI. Catheter in place. Pt states since she left she has been still having abdominal pain, but today she noticed SOB. Vitals WNL

## 2020-10-18 ENCOUNTER — Ambulatory Visit (INDEPENDENT_AMBULATORY_CARE_PROVIDER_SITE_OTHER): Payer: 59 | Admitting: Nurse Practitioner

## 2020-10-18 ENCOUNTER — Encounter: Payer: Self-pay | Admitting: Nurse Practitioner

## 2020-10-18 VITALS — BP 128/70 | HR 68 | Temp 98.8°F | Ht 64.6 in | Wt 206.4 lb

## 2020-10-18 DIAGNOSIS — G8929 Other chronic pain: Secondary | ICD-10-CM

## 2020-10-18 DIAGNOSIS — Z6834 Body mass index (BMI) 34.0-34.9, adult: Secondary | ICD-10-CM

## 2020-10-18 DIAGNOSIS — E6609 Other obesity due to excess calories: Secondary | ICD-10-CM

## 2020-10-18 DIAGNOSIS — N179 Acute kidney failure, unspecified: Secondary | ICD-10-CM

## 2020-10-18 DIAGNOSIS — R188 Other ascites: Secondary | ICD-10-CM

## 2020-10-18 DIAGNOSIS — M5441 Lumbago with sciatica, right side: Secondary | ICD-10-CM

## 2020-10-18 NOTE — Patient Instructions (Signed)
Indwelling Urinary Catheter Care, Adult An indwelling urinary catheter is a thin tube that is put into your bladder. The tube helps to drain pee (urine) out of your body. The tube goes in through your urethra. Your urethra is where pee comes out of your body. Your pee will come out through the catheter, then it will go into a bag (drainage bag). Take good care of your catheter so it will work well. How to wear your catheter and bag Supplies needed Sticky tape (adhesive tape) or a leg strap. Alcohol wipe or soap and water (if you use tape). A clean towel (if you use tape). Large overnight bag. Smaller bag (leg bag). Wearing your catheter Attach your catheter to your leg with tape or a leg strap. Make sure the catheter is not pulled tight. If a leg strap gets wet, take it off and put on a dry strap. If you use tape to hold the bag on your leg: Use an alcohol wipe or soap and water to wash your skin where the tape made it sticky before. Use a clean towel to pat-dry that skin. Use new tape to make the bag stay on your leg. Wearing your bags You should have been given a large overnight bag. You may wear the overnight bag in the day or night. Always have the overnight bag lower than your bladder.  Do not let the bag touch the floor. Before you go to sleep, put a clean plastic bag in a wastebasket. Then hang the overnight bag inside the wastebasket. You should also have a smaller leg bag that fits under your clothes. Always wear the leg bag below your knee. Do not wear your leg bag at night. How to care for your skin and catheter Supplies needed A clean washcloth. Water and mild soap. A clean towel. Caring for your skin and catheter     Clean the skin around your catheter every day: Wash your hands with soap and water. Wet a clean washcloth in warm water and mild soap. Clean the skin around your urethra. If you are female: Gently spread the folds of skin around your vagina  (labia). With the washcloth in your other hand, wipe the inner side of your labia on each side. Wipe from front to back. If you are female: Pull back any skin that covers the end of your penis (foreskin). With the washcloth in your other hand, wipe your penis in small circles. Start wiping at the tip of your penis, then move away from the catheter. Move the foreskin back in place, if needed. With your free hand, hold the catheter close to where it goes into your body. Keep holding the catheter during cleaning so it does not get pulled out. With the washcloth in your other hand, clean the catheter. Only wipe downward on the catheter, toward the drainage bag. Do not wipe upward toward your body. Doing this may push germs into your urethra and cause infection. Use a clean towel to pat-dry the catheter and the skin around it. Make sure to wipe off all soap. Wash your hands with soap and water. Shower every day. Do not take baths. Do not use cream, ointment, or lotion on the area where the catheter goes into your body, unless your doctor tells you to. Do not use powders, sprays, or lotions on your genital area. Check your skin around the catheter every day for signs of infection. Check for: Redness, swelling, or pain. Fluid or blood. Warmth. Pus   or a bad smell. How to empty the bag Supplies needed Rubbing alcohol. Gauze pad or cotton ball. Tape or a leg strap. Emptying the bag Pour the pee out of your bag when it is ?- full, or at least 2-3 times a day. Do this for your overnight bag and your leg bag. Wash your hands with soap and water. Separate (detach) the bag from your leg. Hold the bag over the toilet or a clean pail. Keep the bag lower than your hips and bladder. This is so the pee (urine) does not go back into the tube. Open the pour spout. It is at the bottom of the bag. Empty the pee into the toilet or pail. Do not let the pour spout touch any surface. Put rubbing alcohol on a  gauze pad or cotton ball. Use the gauze pad or cotton ball to clean the pour spout. Close the pour spout. Attach the bag to your leg with tape or a leg strap. Wash your hands with soap and water. Follow instructions for cleaning the drainage bag: From the product maker. As told by your doctor. How to change the bag Changing the bag Replace your bag when it starts to leak, smell bad, or look dirty. Wash your hands with soap and water. Separate the dirty bag from your leg. Pinch the catheter with your fingers so that pee does not spill out. Separate the catheter tube from the bag tube where these tubes connect (at the connection valve). Do not let the tubes touch any surface. Clean the end of the catheter tube with an alcohol wipe. Use a different alcohol wipe to clean the end of the bag tube. Connect the catheter tube to the tube of the clean bag. Attach the clean bag to your leg with tape or a leg strap. Do not make the bag tight on your leg. Wash your hands with soap and water. General rules  Never pull on your catheter. Never try to take it out. Doing that can hurt you. Always wash your hands before and after you touch your catheter or bag. Use a mild, fragrance-free soap. If you do not have soap and water, use hand sanitizer. Always make sure there are no twists or bends (kinks) in the catheter tube. Always make sure there are no leaks in the catheter or bag. Drink enough fluid to keep your pee pale yellow. Do not take baths, swim, or use a hot tub. If you are female, wipe from front to back after you poop (have a bowel movement). Contact a doctor if: Your pee is cloudy. Your pee smells worse than usual. Your catheter gets clogged. Your catheter leaks. Your bladder feels full. Get help right away if: You have redness, swelling, or pain where the catheter goes into your body. You have fluid, blood, pus, or a bad smell coming from the area where the catheter goes into your  body. Your skin feels warm where the catheter goes into your body. You have a fever. You have pain in your: Belly (abdomen). Legs. Lower back. Bladder. You see blood in the catheter. Your pee is pink or red. You feel sick to your stomach (nauseous). You throw up (vomit). You have chills. Your pee is not draining into the bag. Your catheter gets pulled out. Summary An indwelling urinary catheter is a thin tube that is placed into the bladder to help drain pee (urine) out of the body. The catheter is placed into the part of the body   that drains pee from the bladder (urethra). Taking good care of your catheter will keep it working properly and help prevent problems. Always wash your hands before and after touching your catheter or bag. Never pull on your catheter or try to take it out. This information is not intended to replace advice given to you by your health care provider. Make sure you discuss any questions you have with your healthcare provider. Document Revised: 04/07/2020 Document Reviewed: 04/07/2020 Elsevier Patient Education  2022 [employer]      

## 2020-10-18 NOTE — Progress Notes (Signed)
I,Tianna Badgett,acting as a Neurosurgeon for [employer], NP.,have documented all relevant documentation on the behalf of [employer], NP,as directed by  Charlesetta Ivory, NP while in the presence of Charlesetta Ivory, NP.  This visit occurred during the SARS-CoV-2 public health emergency.  Safety protocols were in place, including screening questions prior to the visit, additional usage of staff PPE, and extensive cleaning of exam room while observing appropriate contact time as indicated for disinfecting solutions.  Subjective:     Patient ID: [name] , female    DOB: [birth year] , 45 y.o.   MRN: [id number]   Chief Complaint  Patient presents with   Hospitalization Follow-up    HPI  Patient is here for hospital follow up. She was admitted on 10/05/20. She got discharged on 6/6. She was in the hospital for a bursted bladder. She currently has an indwelling catheter that she does on her own. She is scheduled for a CT on June 22nd to see if her bladder has healed. She sees her urologist on June 27th.  She sees a GI specialist in July. She has a history of gallbladder removal surgery. She also will make an appt to see Ortho/spine specialist.     Past Medical History:  Diagnosis Date   Allergy    Anemia    Angio-edema    Anxiety    Apnea 12/08/2019   Arthritis    Asthma    Back pain    Constipation    Exertional dyspnea 03/14/2020   Fatty liver    GERD (gastroesophageal reflux disease)    H/O: hysterectomy    Hx of endometriosis    Inappropriate sinus tachycardia 12/08/2019   Insomnia    Joint pain    Lactose intolerance    Macromastia 07/2011   Migraine    Morbid obesity (HCC) 12/08/2019   Osteoporosis    Urticaria    Vitamin D deficiency      Family History  Problem Relation Age of Onset   Diabetes Mother    Hypertension Mother    Hyperlipidemia Mother    Sleep apnea Mother    Thyroid disease Sister    Dementia Father    Heart attack Maternal Grandmother     Stroke Maternal Grandfather    Asthma Son    Healthy Son    Asthma Daughter    Healthy Daughter    Colon cancer Neg Hx    Colon polyps Neg Hx    Esophageal cancer Neg Hx    Rectal cancer Neg Hx    Stomach cancer Neg Hx      Current Outpatient Medications:    acetaminophen (TYLENOL) 500 MG tablet, Take 1,000 mg by mouth every 6 (six) hours as needed for moderate pain., Disp: , Rfl:    albuterol (VENTOLIN HFA) 108 (90 Base) MCG/ACT inhaler, Inhale 2 puffs into the lungs every 6 (six) hours as needed for wheezing or shortness of breath., Disp: 1 each, Rfl: 11   calcium carbonate (OS-CAL - DOSED IN MG OF ELEMENTAL CALCIUM) 1250 (500 Ca) MG tablet, Take 1 tablet by mouth., Disp: , Rfl:    cetirizine (ZYRTEC) 10 MG tablet, TAKE 1 TABLET (10 MG TOTAL) BY MOUTH DAILY AS NEEDED FOR RHINITIS., Disp: 90 tablet, Rfl: 1   clindamycin (CLEOCIN T) 1 % lotion, Apply 1 application topically daily., Disp: , Rfl:    diclofenac Sodium (VOLTAREN) 1 % GEL, Apply 2 g topically 4 (four) times daily as needed (musculoskeletal pain)., Disp: 50 g,  Rfl: 0   EPINEPHrine (EPIPEN 2-PAK) 0.3 mg/0.3 mL IJ SOAJ injection, Inject 0.3 mg into the muscle as needed for anaphylaxis. (Patient taking differently: Inject 0.3 mg into the muscle once as needed for anaphylaxis.), Disp: 2 each, Rfl: 2   esomeprazole (NEXIUM) 40 MG capsule, TAKE 1 CAPSULE (40 MG TOTAL) BY MOUTH DAILY AT 12 NOON., Disp: 90 capsule, Rfl: 2   famotidine (PEPCID) 20 MG tablet, Take 20 mg by mouth daily., Disp: , Rfl:    fluticasone furoate-vilanterol (BREO ELLIPTA) 100-25 MCG/INH AEPB, Inhale 1 puff into the lungs daily as needed., Disp: , Rfl:    HYDROcodone-acetaminophen (NORCO/VICODIN) 5-325 MG tablet, Take 1 tablet by mouth every 6 (six) hours as needed for severe pain., Disp: , Rfl:    ondansetron (ZOFRAN) 4 MG tablet, Take 1 tablet (4 mg total) by mouth daily as needed for nausea or vomiting., Disp: 30 tablet, Rfl: 0   RESTASIS 0.05 % ophthalmic  emulsion, Place 1 drop into both eyes 2 (two) times daily., Disp: , Rfl:    sucralfate (CARAFATE) 1 g tablet, TAKE 1 TABLET (1 G TOTAL) BY MOUTH EVERY 6 (SIX) HOURS. SLOWLY DISSOLVE TABLET IN 1 TABLESPOON OF DISTILLED WATER BEFORE INGESTION, Disp: 90 tablet, Rfl: 2   tiZANidine (ZANAFLEX) 4 MG tablet, Take 1 tablet (4 mg total) by mouth every 6 (six) hours as needed for muscle spasms., Disp: 30 tablet, Rfl: 0   Vitamin D, Ergocalciferol, (DRISDOL) 1.25 MG (50000 UNIT) CAPS capsule, 1 po q 2 wks (Patient taking differently: Take 50,000 Units by mouth every 7 (seven) days.), Disp: 2 capsule, Rfl: 0   zolpidem (AMBIEN CR) 12.5 MG CR tablet, Take 12.5 mg by mouth at bedtime as needed for sleep., Disp: , Rfl:    Allergies  Allergen Reactions   Aspirin    Ibuprofen    Salmon [Fish Allergy]    Zoledronic Acid Other (See Comments)     Review of Systems  Constitutional: Negative.  Negative for chills and fatigue.  HENT:  Negative for congestion and sinus pressure.   Respiratory: Negative.  Negative for cough, shortness of breath and wheezing.   Cardiovascular: Negative.  Negative for chest pain and palpitations.  Gastrointestinal: Negative.  Negative for constipation, diarrhea and nausea.  Genitourinary:  Negative for dysuria, frequency, hematuria and urgency.  Musculoskeletal:  Positive for back pain.  Neurological: Negative.  Negative for dizziness and headaches.    Today's Vitals   10/18/20 1616  BP: 128/70  Pulse: 68  Temp: 98.8 F (37.1 C)  TempSrc: Oral  Weight: 206 lb 6.4 oz (93.6 kg)  Height: 5' 4.6" (1.641 m)   Body mass index is 34.77 kg/m.  Wt Readings from Last 3 Encounters:  10/18/20 206 lb 6.4 oz (93.6 kg)  10/05/20 210 lb (95.3 kg)  08/31/20 208 lb 6.4 oz (94.5 kg)     Objective:  Physical Exam Constitutional:      Appearance: Normal appearance. She is obese.  HENT:     Head: Normocephalic and atraumatic.  Cardiovascular:     Rate and Rhythm: Normal rate and  regular rhythm.     Pulses: Normal pulses.     Heart sounds: Normal heart sounds. No murmur heard. Pulmonary:     Effort: Pulmonary effort is normal. No respiratory distress.     Breath sounds: Normal breath sounds. No wheezing or rales.  Skin:    General: Skin is warm and dry.     Capillary Refill: Capillary refill takes less than  2 seconds.  Neurological:     General: No focal deficit present.     Mental Status: She is alert and oriented to person, place, and time.        Assessment And Plan:     1. Other ascites -Patient was hospitalized 6/2 and was admitted for extraperitoneal bladder perforation. She had a paracentesis where 2 L of fluid was removed due to bladder perforation.  -She is to follow up with urology for CT on 10/25/20 and and follow up on 6/27.  -CMP/CBC done yesterday 10/17/20   2. AKI (acute kidney injury) (HCC) -Due to urinary retention  -CMP 10/17/20 GFR > 60  -stable   3. Chronic right-sided low back pain with right-sided sciatica -Chronic -Patient have tried many treatment options such as chiropractor in the past, PT, dry needling, steroid shots.  -She is going to follow up with ortho/back and spine specialist.   4. Class 1 obesity due to excess calories without serious comorbidity with body mass index (BMI) of 34.0 to 34.9 in adult  Advised patient on a healthy diet including avoiding fast food and red meats. Increase the intake of lean meats including grilled chicken and [place].  Drink a lot of water. Decrease intake of fatty foods. Exercise for 30-45 min. 4-5 a week to decrease the risk of cardiac event.   Follow up: if symptoms persist or do not get better.   The patient was encouraged to call or send a message through MyChart for any questions or concerns.   Patient was given opportunity to ask questions. Patient verbalized understanding of the plan and was able to repeat key elements of the plan. All questions were answered to their satisfaction.   Raman Ahmar Pickrell, DNP   I, Raman Braidan Ricciardi have reviewed all documentation for this visit. The documentation on 10/18/20 for the exam, diagnosis, procedures, and orders are all accurate and complete.    IF YOU HAVE BEEN REFERRED TO A SPECIALIST, IT MAY TAKE 1-2 WEEKS TO SCHEDULE/PROCESS THE REFERRAL. IF YOU HAVE NOT HEARD FROM US/SPECIALIST IN TWO WEEKS, PLEASE GIVE [place] A CALL AT [phone].   THE PATIENT IS ENCOURAGED TO PRACTICE SOCIAL DISTANCING DUE TO THE COVID-19 PANDEMIC.

## 2020-10-25 ENCOUNTER — Other Ambulatory Visit: Payer: Self-pay

## 2020-10-25 ENCOUNTER — Encounter: Payer: Self-pay | Admitting: Nurse Practitioner

## 2020-10-25 ENCOUNTER — Ambulatory Visit (HOSPITAL_COMMUNITY)
Admission: RE | Admit: 2020-10-25 | Discharge: 2020-10-25 | Disposition: A | Discharge status: Home / Self Care | Payer: 59 | Source: Ambulatory Visit | Attending: Urology | Admitting: Urology

## 2020-10-25 DIAGNOSIS — E559 Vitamin D deficiency, unspecified: Secondary | ICD-10-CM

## 2020-10-25 DIAGNOSIS — S3729XA Other injury of bladder, initial encounter: Secondary | ICD-10-CM | POA: Insufficient documentation

## 2020-10-25 MED ORDER — IOTHALAMATE MEGLUMINE 17.2 % UR SOLN
300.0000 mL | Freq: Once | URETHRAL | Status: AC | PRN
  Administered 2020-10-25: 300 mL via INTRAVESICAL

## 2020-10-25 MED ORDER — VITAMIN D (ERGOCALCIFEROL) 1.25 MG (50000 UNIT) PO CAPS: 50000.0000 [IU] | ORAL_CAPSULE | ORAL | 3 refills | Status: DC

## 2020-10-31 ENCOUNTER — Encounter (HOSPITAL_COMMUNITY): Payer: Self-pay

## 2020-10-31 ENCOUNTER — Emergency Department (HOSPITAL_COMMUNITY): Payer: 59

## 2020-10-31 ENCOUNTER — Other Ambulatory Visit: Payer: Self-pay

## 2020-10-31 ENCOUNTER — Emergency Department (HOSPITAL_COMMUNITY)
Admission: EM | Admit: 2020-10-31 | Discharge: 2020-10-31 | Disposition: A | Discharge status: Home / Self Care | Payer: 59 | Attending: Emergency Medicine | Admitting: Emergency Medicine

## 2020-10-31 ENCOUNTER — Other Ambulatory Visit: Payer: Self-pay | Admitting: Nurse Practitioner

## 2020-10-31 DIAGNOSIS — J4540 Moderate persistent asthma, uncomplicated: Secondary | ICD-10-CM | POA: Insufficient documentation

## 2020-10-31 DIAGNOSIS — R110 Nausea: Secondary | ICD-10-CM | POA: Diagnosis not present

## 2020-10-31 DIAGNOSIS — Z96611 Presence of right artificial shoulder joint: Secondary | ICD-10-CM | POA: Diagnosis not present

## 2020-10-31 DIAGNOSIS — R1030 Lower abdominal pain, unspecified: Secondary | ICD-10-CM | POA: Diagnosis present

## 2020-10-31 DIAGNOSIS — R339 Retention of urine, unspecified: Secondary | ICD-10-CM | POA: Diagnosis not present

## 2020-10-31 LAB — CBC WITH DIFFERENTIAL/PLATELET
Abs Immature Granulocytes: 0.02 10*3/uL (ref 0.00–0.07)
Basophils Absolute: 0 10*3/uL (ref 0.0–0.1)
Basophils Relative: 1 %
Eosinophils Absolute: 0 10*3/uL (ref 0.0–0.5)
Eosinophils Relative: 0 %
HCT: 35.8 % — ABNORMAL LOW (ref 36.0–46.0)
Hemoglobin: 11 g/dL — ABNORMAL LOW (ref 12.0–15.0)
Immature Granulocytes: 0 %
Lymphocytes Relative: 24 %
Lymphs Abs: 1.6 10*3/uL (ref 0.7–4.0)
MCH: 25.6 pg — ABNORMAL LOW (ref 26.0–34.0)
MCHC: 30.7 g/dL (ref 30.0–36.0)
MCV: 83.3 fL (ref 80.0–100.0)
Monocytes Absolute: 0.4 10*3/uL (ref 0.1–1.0)
Monocytes Relative: 6 %
Neutro Abs: 4.5 10*3/uL (ref 1.7–7.7)
Neutrophils Relative %: 69 %
Platelets: 295 10*3/uL (ref 150–400)
RBC: 4.3 MIL/uL (ref 3.87–5.11)
RDW: 13.2 % (ref 11.5–15.5)
WBC: 6.5 10*3/uL (ref 4.0–10.5)
nRBC: 0 % (ref 0.0–0.2)

## 2020-10-31 LAB — URINALYSIS, ROUTINE W REFLEX MICROSCOPIC
Bacteria, UA: NONE SEEN
Bilirubin Urine: NEGATIVE
Glucose, UA: NEGATIVE mg/dL
Ketones, ur: NEGATIVE mg/dL
Nitrite: NEGATIVE
Protein, ur: NEGATIVE mg/dL
Specific Gravity, Urine: 1.006 (ref 1.005–1.030)
pH: 7 (ref 5.0–8.0)

## 2020-10-31 LAB — COMPREHENSIVE METABOLIC PANEL
ALT: 20 U/L (ref 0–44)
AST: 19 U/L (ref 15–41)
Albumin: 4.5 g/dL (ref 3.5–5.0)
Alkaline Phosphatase: 46 U/L (ref 38–126)
Anion gap: 8 (ref 5–15)
BUN: 11 mg/dL (ref 6–20)
CO2: 24 mmol/L (ref 22–32)
Calcium: 9.1 mg/dL (ref 8.9–10.3)
Chloride: 107 mmol/L (ref 98–111)
Creatinine, Ser: 1 mg/dL (ref 0.44–1.00)
GFR, Estimated: >60 mL/min (ref >60)
Glucose, Bld: 89 mg/dL (ref 70–99)
Potassium: 4.1 mmol/L (ref 3.5–5.1)
Sodium: 139 mmol/L (ref 135–145)
Total Bilirubin: 0.4 mg/dL (ref 0.3–1.2)
Total Protein: 7.9 g/dL (ref 6.5–8.1)

## 2020-10-31 LAB — LIPASE, BLOOD: Lipase: 30 U/L (ref 11–51)

## 2020-10-31 MED ORDER — IOHEXOL 300 MG/ML  SOLN
100.0000 mL | Freq: Once | INTRAMUSCULAR | Status: AC | PRN
  Administered 2020-10-31: 100 mL via INTRAVENOUS

## 2020-10-31 MED ORDER — OXYCODONE HCL 5 MG PO TABS
5.0000 mg | ORAL_TABLET | Freq: Once | ORAL | Status: AC
  Administered 2020-10-31: 5 mg via ORAL
  Filled 2020-10-31: qty 1

## 2020-10-31 MED ORDER — SODIUM CHLORIDE 0.9 % IV SOLN
INTRAVENOUS | Status: AC
  Filled 2020-10-31: qty 250

## 2020-10-31 MED ORDER — OXYBUTYNIN CHLORIDE 5 MG PO TABS: 5.0000 mg | ORAL_TABLET | Freq: Three times a day (TID) | ORAL | 0 refills | Status: DC

## 2020-10-31 MED ORDER — SODIUM CHLORIDE (PF) 0.9 % IJ SOLN
INTRAMUSCULAR | Status: AC
  Filled 2020-10-31: qty 50

## 2020-10-31 MED ORDER — HYDROMORPHONE HCL 1 MG/ML IJ SOLN
1.0000 mg | Freq: Once | INTRAMUSCULAR | Status: AC
Start: 2020-10-31 — End: 2020-10-31
  Administered 2020-10-31: 1 mg via INTRAVENOUS
  Filled 2020-10-31: qty 1

## 2020-10-31 MED ORDER — ONDANSETRON HCL 4 MG/2ML IJ SOLN
4.0000 mg | Freq: Once | INTRAMUSCULAR | Status: AC
  Administered 2020-10-31: 4 mg via INTRAVENOUS
  Filled 2020-10-31: qty 2

## 2020-10-31 MED ORDER — TAMSULOSIN HCL 0.4 MG PO CAPS: 0.4000 mg | ORAL_CAPSULE | Freq: Every day | ORAL | 0 refills | Status: DC

## 2020-10-31 NOTE — ED Provider Notes (Signed)
[facility]-EMERGENCY DEPT Provider Note   CSN: [id number] Arrival date & time: 10/31/20  0541     History Chief Complaint  Patient presents with   Abdominal Pain    [name] is a 45 y.o. female.  HPI     45yo female with history of obesity, moderate persistent asthma, anemia, GERD, admission 10/05/2020 for spontaneous extraperitoneal bladder perforation with ascites thought to be uroperitoneum (although other studies pending), hx of LSO with incidental cystotomy with repair 01/2011, had paracentesis and foley catheter placed with removal yesterday presents with concern for abdominal pain.   Reports she had the catheter removed around 4 PM yesterday, and at 8 PM, developed severe lower abdominal pain.  Reports she has been unable to urinate.  She is writhing around in pain at time of my evaluation. Severe 10/10 pain.  Associated nausea. Denies fevers. Pain lower abdomen with some radiation to flanks and diffusely.   Past Medical History:  Diagnosis Date   Allergy    Anemia    Angio-edema    Anxiety    Apnea 12/08/2019   Arthritis    Asthma    Back pain    Constipation    Exertional dyspnea 03/14/2020   Fatty liver    GERD (gastroesophageal reflux disease)    H/O: hysterectomy    Hx of endometriosis    Inappropriate sinus tachycardia 12/08/2019   Insomnia    Joint pain    Lactose intolerance    Macromastia 07/2011   Migraine    Morbid obesity (HCC) 12/08/2019   Osteoporosis    Urticaria    Vitamin D deficiency     Patient Active Problem List   Diagnosis Date Noted   AKI (acute kidney injury) (HCC) 10/06/2020   Ascites 10/05/2020   Other insomnia 07/27/2020   At risk for side effect of medication 07/27/2020   Other hyperlipidemia 05/15/2020   At risk for osteoporosis 05/15/2020   At risk for impaired metabolic function 04/04/2020   Other fatigue 03/22/2020   SOBOE (shortness of breath on exertion) 03/22/2020   Exertional dyspnea 03/14/2020    Neuropathy, right side of face 03/08/2020   At risk for depression 03/08/2020   Primary osteoarthritis of both feet 02/26/2020   Vitamin D deficiency, unspecified 01/26/2020   Hyperlipidemia 01/26/2020   Prediabetes 01/26/2020   At risk for heart disease 01/26/2020   Daytime somnolence 01/26/2020   Vitamin D deficiency 01/04/2020   Fatty liver 01/04/2020   B12 deficiency 01/04/2020   Moderate persistent asthma without complication 12/31/2019   Inappropriate sinus tachycardia 12/08/2019   Class 3 severe obesity with serious comorbidity and body mass index (BMI) of 40.0 to 44.9 in adult (HCC) 12/08/2019   Apnea 12/08/2019   Anaphylactic reaction due to food, subsequent encounter 11/22/2019   Allergic rhinoconjunctivitis 11/22/2019   Idiopathic urticaria 11/22/2019   Generalized anxiety disorder 04/08/2018   Atypical chest pain    Regurgitation of food    Laryngopharyngeal reflux (LPR) 07/30/2017   Throat soreness 07/30/2017   ANA positive 12/30/2016   Gastroesophageal reflux disease 06/27/2016   Disturbance of skin sensation 04/27/2014   Dizziness and giddiness 03/14/2014   Headache, migraine 03/14/2014   Myalgia and myositis, unspecified 06/07/2013   Nausea and vomiting in adult 08/22/2012   Viral syndrome 08/22/2012   Pelvic pain in female 12/18/2010   PID (acute pelvic inflammatory disease) 12/18/2010   ANEMIA-IRON DEFICIENCY 10/15/2006   HERPES, GENITAL NOS 09/30/2006   Obesity (BMI 35.0-39.9 without comorbidity)  09/30/2006   DISORDER, NONORGANIC SLEEP NOS 09/30/2006   CONSTIPATION NOS 09/30/2006   NEURITIS, LUMBOSACRAL NOS 09/30/2006   HX, PERSONAL, MENTAL DISORDER NOS 09/30/2006    Past Surgical History:  Procedure Laterality Date   83 HOUR PH STUDY N/A 01/12/2018   Procedure: 24 HOUR PH STUDY;  Surgeon: Napoleon Form, MD;  Location: [facility];  Service: Endoscopy;  Laterality: N/A;   ABDOMINAL HYSTERECTOMY  12/12/2003   ADENOIDECTOMY  05/2011   ANKLE  ARTHROSCOPY  12/29/2007   right; with extensive debridement   BLADDER NECK RECONSTRUCTION  01/14/2011   Procedure: BLADDER NECK REPAIR;  Surgeon: Reva Bores, MD;  Location: [facility];  Service: Gynecology;  Laterality: N/A;  Laparoscopic Repair of Incidental Cystotomy   BREAST REDUCTION SURGERY  08/05/2011   Procedure: MAMMARY REDUCTION  (BREAST);  Surgeon: Louisa Second, MD;  Location: [facility];  Service: Plastics;  Laterality: Bilateral;  bilateral   BREAST SURGERY  2012   breast reduction   CESAREAN SECTION  12/29/2000; 1994   DILATION AND CURETTAGE OF UTERUS  07/08/2003   open laparoscopy   ESOPHAGEAL MANOMETRY N/A 01/12/2018   Procedure: ESOPHAGEAL MANOMETRY (EM);  Surgeon: Napoleon Form, MD;  Location: [facility];  Service: Endoscopy;  Laterality: N/A;   GIVENS CAPSULE STUDY N/A 06/23/2017   Procedure: GIVENS CAPSULE STUDY;  Surgeon: Benancio Deeds, MD;  Location: [facility];  Service: Gastroenterology;  Laterality: N/A;   LAPAROSCOPIC SALPINGOOPHERECTOMY  01/14/2011   left   PH IMPEDANCE STUDY N/A 01/12/2018   Procedure: PH IMPEDANCE STUDY;  Surgeon: Napoleon Form, MD;  Location: [facility];  Service: Endoscopy;  Laterality: N/A;   REPAIR PERONEAL TENDONS ANKLE  01/03/2010   repair right subluxing peroneal tendons   RESECTION DISTAL CLAVICAL Right 09/25/2017   Procedure: RESECTION DISTAL CLAVICAL;  Surgeon: Bjorn Pippin, MD;  Location: [facility];  Service: Orthopedics;  Laterality: Right;   RIGHT OOPHORECTOMY     with lysis of adhesions   SHOULDER ACROMIOPLASTY Right 09/25/2017   Procedure: SHOULDER ACROMIOPLASTY;  Surgeon: Bjorn Pippin, MD;  Location: [facility];  Service: Orthopedics;  Laterality: Right;   SHOULDER ARTHROSCOPY WITH DEBRIDEMENT AND BICEP TENDON REPAIR Right 09/25/2017   Procedure: SHOULDER ARTHROSCOPY WITH DEBRIDEMENT AND BICEP TENDON REPAIR;  Surgeon: Bjorn Pippin, MD;  Location: [facility];  Service: Orthopedics;  Laterality: Right;   SHOULDER ARTHROSCOPY WITH ROTATOR CUFF REPAIR Right 09/25/2017   Procedure: SHOULDER ARTHROSCOPY WITH ROTATOR CUFF REPAIR;  Surgeon: Bjorn Pippin, MD;  Location: [facility];  Service: Orthopedics;  Laterality: Right;   TONSILLECTOMY  as a child   TUBAL LIGATION  12/29/2000   UPPER GASTROINTESTINAL ENDOSCOPY       OB History     Gravida  2   Para  2   Term  2   Preterm      AB      Living  2      SAB      IAB      Ectopic      Multiple      Live Births              Family History  Problem Relation Age of Onset   Diabetes Mother    Hypertension Mother    Hyperlipidemia Mother    Sleep apnea Mother    Thyroid disease Sister    Dementia Father    Heart attack Maternal  Grandmother    Stroke Maternal Grandfather    Asthma Son    Healthy Son    Asthma Daughter    Healthy Daughter    Colon cancer Neg Hx    Colon polyps Neg Hx    Esophageal cancer Neg Hx    Rectal cancer Neg Hx    Stomach cancer Neg Hx     Social History   Tobacco Use   Smoking status: Never   Smokeless tobacco: Never  Vaping Use   Vaping Use: Never used  Substance Use Topics   Alcohol use: Yes    Comment: occasional    Drug use: No    Home Medications Prior to Admission medications   Medication Sig Start Date End Date Taking? Authorizing Provider  acetaminophen (TYLENOL) 500 MG tablet Take 1,000 mg by mouth every 6 (six) hours as needed for moderate pain.    [provider]  albuterol (VENTOLIN HFA) 108 (90 Base) MCG/ACT inhaler Inhale 2 puffs into the lungs every 6 (six) hours as needed for wheezing or shortness of breath. 01/25/20   Charlott Holler, MD  calcium carbonate (OS-CAL - DOSED IN MG OF ELEMENTAL CALCIUM) 1250 (500 Ca) MG tablet Take 1 tablet by mouth.    [provider]  cetirizine (ZYRTEC) 10 MG tablet TAKE 1 TABLET (10 MG TOTAL) BY MOUTH DAILY AS NEEDED FOR RHINITIS. 05/16/20   Ambs,  Norvel Richards, FNP  clindamycin (CLEOCIN T) 1 % lotion Apply 1 application topically daily.    [provider]  diclofenac Sodium (VOLTAREN) 1 % GEL Apply 2 g topically 4 (four) times daily as needed (musculoskeletal pain). 10/09/20   Zigmund Daniel., MD  EPINEPHrine (EPIPEN 2-PAK) 0.3 mg/0.3 mL IJ SOAJ injection Inject 0.3 mg into the muscle as needed for anaphylaxis. Patient taking differently: Inject 0.3 mg into the muscle once as needed for anaphylaxis. 07/07/20   Hetty Blend, FNP  esomeprazole (NEXIUM) 40 MG capsule TAKE 1 CAPSULE (40 MG TOTAL) BY MOUTH DAILY AT 12 NOON. 03/24/20   Armbruster, Willaim Rayas, MD  famotidine (PEPCID) 20 MG tablet Take 20 mg by mouth daily. 01/22/20   [provider]  fluticasone furoate-vilanterol (BREO ELLIPTA) 100-25 MCG/INH AEPB Inhale 1 puff into the lungs daily as needed.    [provider]  HYDROcodone-acetaminophen (NORCO/VICODIN) 5-325 MG tablet Take 1 tablet by mouth every 6 (six) hours as needed for severe pain. 09/15/20   [provider]  ondansetron (ZOFRAN) 4 MG tablet Take 1 tablet (4 mg total) by mouth daily as needed for nausea or vomiting. 04/19/20 04/19/21  [name], FNP  RESTASIS 0.05 % ophthalmic emulsion Place 1 drop into both eyes 2 (two) times daily. 11/04/19   [provider]  sucralfate (CARAFATE) 1 g tablet TAKE 1 TABLET (1 G TOTAL) BY MOUTH EVERY 6 (SIX) HOURS. SLOWLY DISSOLVE TABLET IN 1 TABLESPOON OF DISTILLED WATER BEFORE INGESTION 04/21/20   Armbruster, Willaim Rayas, MD  tiZANidine (ZANAFLEX) 4 MG tablet Take 1 tablet (4 mg total) by mouth every 6 (six) hours as needed for muscle spasms. 10/04/20   Ivette Loyal, NP  Vitamin D, Ergocalciferol, (DRISDOL) 1.25 MG (50000 UNIT) CAPS capsule Take 1 capsule (50,000 Units total) by mouth every 7 (seven) days. 10/25/20   [name], FNP  zolpidem (AMBIEN CR) 12.5 MG CR tablet Take 12.5 mg by mouth at bedtime as needed for sleep. 09/13/19   [provider]    Allergies  Aspirin, Ibuprofen, Salmon [fish allergy], and Zoledronic acid  Review of Systems   Review of Systems  Unable to perform ROS: Acuity of condition  Constitutional:  Negative for fever.  Gastrointestinal:  Positive for abdominal pain and nausea. Negative for constipation, diarrhea and vomiting.  Genitourinary:  Positive for difficulty urinating and dysuria.   Physical Exam Updated Vital Signs BP (!) 156/105   Pulse (!) 110   Temp 98 F (36.7 C) (Oral)   Resp 16   Ht 5\' 4"  (1.626 m)   Wt 95.3 kg   LMP 12/27/2003   SpO2 100%   BMI 36.05 kg/m   Physical Exam Vitals and nursing note reviewed.  Constitutional:      General: She is in acute distress (severe pain).     Appearance: She is well-developed. She is ill-appearing. She is not diaphoretic.  HENT:     Head: Normocephalic and atraumatic.  Eyes:     Conjunctiva/sclera: Conjunctivae normal.  Cardiovascular:     Rate and Rhythm: Normal rate and regular rhythm.  Pulmonary:     Effort: Pulmonary effort is normal. No respiratory distress.  Abdominal:     General: There is no distension.     Palpations: Abdomen is soft.     Tenderness: There is abdominal tenderness in the suprapubic area. There is guarding.  Musculoskeletal:        General: No tenderness.     Cervical back: Normal range of motion.  Skin:    General: Skin is warm and dry.     Findings: No erythema or rash.  Neurological:     Mental Status: She is alert and oriented to person, place, and time.    ED Results / Procedures / Treatments   Labs (all labs ordered are listed, but only abnormal results are displayed) Labs Reviewed  URINE CULTURE  CBC WITH DIFFERENTIAL/PLATELET  COMPREHENSIVE METABOLIC PANEL  LIPASE, BLOOD  URINALYSIS, ROUTINE W REFLEX MICROSCOPIC    EKG None  Radiology No results found.  Procedures Procedures   Medications Ordered in ED Medications  HYDROmorphone (DILAUDID) injection 1 mg (has no  administration in time range)  ondansetron (ZOFRAN) injection 4 mg (has no administration in time range)    ED Course  I have reviewed the triage vital signs and the nursing notes.  Pertinent labs & imaging results that were available during my care of the patient were reviewed by me and considered in my medical decision making (see chart for details).    MDM Rules/Calculators/A&P                           45yo female with history of obesity, moderate persistent asthma, anemia, GERD, admission 10/05/2020 for spontaneous extraperitoneal bladder perforation with ascites thought to be uroperitoneum (although other studies pending), hx of LSO with incidental cystotomy with repair 01/2011, had paracentesis and foley catheter placed with removal yesterday presents with concern for abdominal pain and difficulty urinating.   She is in severe pain on arrival to the ED.  Foley catheter ordered and discussed with Dr. 02/2011 of Urology. Plan for cathter followed by CT cystogram. Pain medications and labs ordered and pending.    CT cystography does not show signs of intra or extraperitoneal leakage, decreaed free intraperitoneal fluid. Pain improved with catheter placement but some continuing pain. Discussed with Dr. Berneice Heinrich of Urology who also reviewed the imaging and recommends outpatient follow up. Discussed UA findings, and agree with culture and do  not recommend abx. Recommends oxybutinin and tamsulosin. Given rx for these, discharged with foley in place for outpatient follow up with Dr. Marlou Porch. Pain likely secondary to bladder distention from urinary retention with healed bladder injury. Patient discharged in stable condition with understanding of reasons to return.    Final Clinical Impression(s) / ED Diagnoses Final diagnoses:  Urinary retention  Lower abdominal pain    Rx / DC Orders ED Discharge Orders     None        Alvira Monday, MD 10/31/20 2213

## 2020-10-31 NOTE — ED Notes (Signed)
Pt. C/o pelvic pain. Unable to void at this time. Aware urine sample needed.

## 2020-10-31 NOTE — ED Triage Notes (Signed)
Pt states that her provider removed her foley catheter yesterday around 4 pm. She states that she had a hole in her bladder that had healed. The pain in her abdomen begin around 8 pm. Pt states that it hurts to urinate, to walk and to sit.

## 2020-10-31 NOTE — ED Notes (Signed)
Patient given leg bag and educated on how to maintain catheter care.

## 2020-11-01 LAB — URINE CULTURE: Culture: <10000 — AB

## 2020-11-02 ENCOUNTER — Telehealth: Payer: Self-pay | Admitting: *Deleted

## 2020-11-02 NOTE — Chronic Care Management (AMB) (Signed)
  Care Management   Outreach Note  11/02/2020 Name: [name] MRN: [id number] DOB: October 16, 1975  Referred by: [name], FNP Reason for referral : Chronic Care Management (Initial outreach to schedule referral with RNCM and BSW)   An unsuccessful telephone outreach was attempted today. The patient was referred to the case management team for assistance with care management and care coordination.   Follow Up Plan: A HIPAA compliant phone message was left for the patient providing contact information and requesting a return call. The care management team will reach out to the patient again over the next 2 days.  If patient returns call to provider office, please advise to call Embedded Care Management Care Guide Gwenevere Ghazi at [phone].  Gwenevere Ghazi  Care Guide, Embedded Care Coordination [facility] Management

## 2020-11-02 NOTE — Chronic Care Management (AMB) (Signed)
  Care Management   Note  11/02/2020 Name: TOPAZ RAGLIN MRN: [id number] DOB: May 05, 1976  Georgina Snell Yorks is a 45 y.o. year old female who is a primary care patient of [name], FNP. I reached out to [employer] by phone today in response to a referral sent by Ms. Miya S Speelman's PCP, [name], FNP..    Ms. Orrison was given information about care management services today including:  Care management services include personalized support from designated clinical staff supervised by her physician, including individualized plan of care and coordination with other care providers 24/7 contact phone numbers for assistance for urgent and routine care needs. The patient may stop care management services at any time by phone call to the office staff.  Patient agreed to services and verbal consent obtained.   Follow up plan: Telephone appointment with care management team member scheduled for:11/27/2020  [facility] Guide, Embedded Care Coordination [facility] Management

## 2020-11-07 ENCOUNTER — Telehealth: Payer: 59

## 2020-11-07 ENCOUNTER — Telehealth: Payer: Self-pay

## 2020-11-07 LAB — FUNGUS CULTURE WITH STAIN

## 2020-11-07 LAB — FUNGAL ORGANISM REFLEX

## 2020-11-07 LAB — FUNGUS CULTURE RESULT

## 2020-11-07 NOTE — Telephone Encounter (Signed)
  Care Management   Follow Up Note   11/07/2020 Name: [name] MRN: [id number] DOB: 1975-08-19   Referred by: [name], FNP Reason for referral : Care Coordination (Initial RN CM outreach - 1st attempt )   An unsuccessful telephone outreach was attempted today. The patient was referred to the case management team for assistance with care management and care coordination.   Follow Up Plan: A HIPPA compliant phone message was left for the patient providing contact information and requesting a return call.   Delsa Sale, RN, BSN, CCM Care Management Coordinator [facility] Care Management/[facility]  Direct Phone: [phone]

## 2020-11-10 ENCOUNTER — Ambulatory Visit (INDEPENDENT_AMBULATORY_CARE_PROVIDER_SITE_OTHER): Payer: 59 | Admitting: Physician Assistant

## 2020-11-10 ENCOUNTER — Encounter: Payer: Self-pay | Admitting: Physician Assistant

## 2020-11-10 VITALS — BP 108/64 | HR 72 | Ht 63.75 in | Wt 210.4 lb

## 2020-11-10 DIAGNOSIS — R140 Abdominal distension (gaseous): Secondary | ICD-10-CM

## 2020-11-10 DIAGNOSIS — R110 Nausea: Secondary | ICD-10-CM

## 2020-11-10 DIAGNOSIS — R1013 Epigastric pain: Secondary | ICD-10-CM | POA: Diagnosis not present

## 2020-11-10 MED ORDER — ESOMEPRAZOLE MAGNESIUM 40 MG PO CPDR: 40.0000 mg | DELAYED_RELEASE_CAPSULE | Freq: Two times a day (BID) | ORAL | 3 refills | Status: DC

## 2020-11-10 NOTE — Progress Notes (Signed)
Chief Complaint: Epigastric pain, nausea, bloating  HPI:    [name] is a 45 year old African-American female with a past medical history as listed below, known to Dr. Adela Lank, who was referred to me by [name], FNP for a complaint of epigastric pain, nausea and bloating.      01/05/2020 EGD Dr. Adela Lank done for dysphagia.  She had a 3 cm hiatal hernia and never thing else was otherwise normal.    10/05/2020-10/09/2020 patient admitted to the hospital and found to have spontaneous extraperitoneal bladder perforation.  Foley was inserted with plans to keep this for 2 weeks.  The Foley was removed and patient had trouble with urinary retention so has been put back in.    10/31/2020 CT cystogram abdomen/pelvis did not show any intraperitoneal or extraperitoneal leakage.    Today, the patient tells me that after hospitalization for bladder perforation as above she started with epigastric pain and bloating as well as nausea.  Explains that now anytime that she eats anything she will develop epigastric discomfort which feels like a "aching", this is almost immediately when the food hits her stomach.  It does not have been every time, but the majority of the time.  Also has developed a lot of gas and bloating ever since being in the hospital.  Associated symptoms include nausea.  Currently she is taking her Nexium 40 mg at noon and denies any overt reflux or heartburn symptoms.    Denies fever, chills, weight loss, blood in her stool or change in bowel habits.     Past Medical History:  Diagnosis Date   Allergy    Anemia    Angio-edema    Anxiety    Apnea 12/08/2019   Arthritis    Asthma    Back pain    Constipation    Exertional dyspnea 03/14/2020   Fatty liver    GERD (gastroesophageal reflux disease)    H/O: hysterectomy    Hx of endometriosis    Inappropriate sinus tachycardia 12/08/2019   Insomnia    Joint pain    Lactose intolerance    Macromastia 07/2011   Migraine    Morbid  obesity (HCC) 12/08/2019   Osteoporosis    Urticaria    Vitamin D deficiency     Past Surgical History:  Procedure Laterality Date   71 HOUR PH STUDY N/A 01/12/2018   Procedure: 24 HOUR PH STUDY;  Surgeon: Napoleon Form, MD;  Location: [facility];  Service: Endoscopy;  Laterality: N/A;   ABDOMINAL HYSTERECTOMY  12/12/2003   ADENOIDECTOMY  05/2011   ANKLE ARTHROSCOPY  12/29/2007   right; with extensive debridement   BLADDER NECK RECONSTRUCTION  01/14/2011   Procedure: BLADDER NECK REPAIR;  Surgeon: Reva Bores, MD;  Location: [facility];  Service: Gynecology;  Laterality: N/A;  Laparoscopic Repair of Incidental Cystotomy   BREAST REDUCTION SURGERY  08/05/2011   Procedure: MAMMARY REDUCTION  (BREAST);  Surgeon: Louisa Second, MD;  Location: [facility];  Service: Plastics;  Laterality: Bilateral;  bilateral   BREAST SURGERY  2012   breast reduction   CESAREAN SECTION  12/29/2000; 1994   DILATION AND CURETTAGE OF UTERUS  07/08/2003   open laparoscopy   ESOPHAGEAL MANOMETRY N/A 01/12/2018   Procedure: ESOPHAGEAL MANOMETRY (EM);  Surgeon: Napoleon Form, MD;  Location: [facility];  Service: Endoscopy;  Laterality: N/A;   GIVENS CAPSULE STUDY N/A 06/23/2017   Procedure: GIVENS CAPSULE STUDY;  Surgeon: Benancio Deeds, MD;  Location:  [facility];  Service: Gastroenterology;  Laterality: N/A;   LAPAROSCOPIC SALPINGOOPHERECTOMY  01/14/2011   left   PH IMPEDANCE STUDY N/A 01/12/2018   Procedure: PH IMPEDANCE STUDY;  Surgeon: Napoleon Form, MD;  Location: [facility];  Service: Endoscopy;  Laterality: N/A;   REPAIR PERONEAL TENDONS ANKLE  01/03/2010   repair right subluxing peroneal tendons   RESECTION DISTAL CLAVICAL Right 09/25/2017   Procedure: RESECTION DISTAL CLAVICAL;  Surgeon: Bjorn Pippin, MD;  Location: [facility];  Service: Orthopedics;  Laterality: Right;   RIGHT OOPHORECTOMY     with lysis of adhesions   SHOULDER ACROMIOPLASTY Right 09/25/2017    Procedure: SHOULDER ACROMIOPLASTY;  Surgeon: Bjorn Pippin, MD;  Location: [facility];  Service: Orthopedics;  Laterality: Right;   SHOULDER ARTHROSCOPY WITH DEBRIDEMENT AND BICEP TENDON REPAIR Right 09/25/2017   Procedure: SHOULDER ARTHROSCOPY WITH DEBRIDEMENT AND BICEP TENDON REPAIR;  Surgeon: Bjorn Pippin, MD;  Location: [facility];  Service: Orthopedics;  Laterality: Right;   SHOULDER ARTHROSCOPY WITH ROTATOR CUFF REPAIR Right 09/25/2017   Procedure: SHOULDER ARTHROSCOPY WITH ROTATOR CUFF REPAIR;  Surgeon: Bjorn Pippin, MD;  Location: [facility];  Service: Orthopedics;  Laterality: Right;   TONSILLECTOMY  as a child   TUBAL LIGATION  12/29/2000   UPPER GASTROINTESTINAL ENDOSCOPY      Current Outpatient Medications  Medication Sig Dispense Refill   acetaminophen (TYLENOL) 500 MG tablet Take 1,000 mg by mouth every 6 (six) hours as needed for moderate pain.     albuterol (VENTOLIN HFA) 108 (90 Base) MCG/ACT inhaler Inhale 2 puffs into the lungs every 6 (six) hours as needed for wheezing or shortness of breath. 1 each 11   calcium carbonate (OS-CAL - DOSED IN MG OF ELEMENTAL CALCIUM) 1250 (500 Ca) MG tablet Take 1 tablet by mouth.     cetirizine (ZYRTEC) 10 MG tablet TAKE 1 TABLET (10 MG TOTAL) BY MOUTH DAILY AS NEEDED FOR RHINITIS. 90 tablet 1   clindamycin (CLEOCIN T) 1 % lotion Apply 1 application topically daily.     diclofenac Sodium (VOLTAREN) 1 % GEL Apply 2 g topically 4 (four) times daily as needed (musculoskeletal pain). 50 g 0   famotidine (PEPCID) 20 MG tablet Take 20 mg by mouth daily.     fluticasone furoate-vilanterol (BREO ELLIPTA) 100-25 MCG/INH AEPB Inhale 1 puff into the lungs daily as needed.     HYDROcodone-acetaminophen (NORCO/VICODIN) 5-325 MG tablet Take 1 tablet by mouth every 6 (six) hours as needed for severe pain.     ondansetron (ZOFRAN) 4 MG tablet Take 1 tablet (4 mg total) by mouth daily as needed for nausea or  vomiting. 30 tablet 0   RESTASIS 0.05 % ophthalmic emulsion Place 1 drop into both eyes 2 (two) times daily.     silver sulfADIAZINE (SILVADENE) 1 % cream SMARTSIG:0.0625 Inch(es) Topical Twice Daily     sucralfate (CARAFATE) 1 g tablet TAKE 1 TABLET (1 G TOTAL) BY MOUTH EVERY 6 (SIX) HOURS. SLOWLY DISSOLVE TABLET IN 1 TABLESPOON OF DISTILLED WATER BEFORE INGESTION 90 tablet 2   tretinoin (RETIN-A) 0.025 % cream tretinoin 0.025 % topical cream  APPLY APPLICATIONS APPLY A PEA-SIZED AMOUNT TO FACE AT BEDTIME     Vitamin D, Ergocalciferol, (DRISDOL) 1.25 MG (50000 UNIT) CAPS capsule Take 1 capsule (50,000 Units total) by mouth every 7 (seven) days. 15 capsule 3   zolpidem (AMBIEN CR) 12.5 MG CR tablet Take 12.5 mg by mouth at  bedtime as needed for sleep.     EPINEPHrine (EPIPEN 2-PAK) 0.3 mg/0.3 mL IJ SOAJ injection Inject 0.3 mg into the muscle as needed for anaphylaxis. (Patient not taking: Reported on 11/10/2020) 2 each 2   esomeprazole (NEXIUM) 40 MG capsule Take 1 capsule (40 mg total) by mouth 2 (two) times daily before a meal. 60 capsule 3   No current facility-administered medications for this visit.    Allergies as of 11/10/2020 - Review Complete 11/10/2020  Allergen Reaction Noted   Aspirin  02/14/2020   Ibuprofen  02/14/2020   Salmon [fish allergy]  12/06/2019   Zoledronic acid Other (See Comments) 01/19/2020    Family History  Problem Relation Age of Onset   Diabetes Mother    Hypertension Mother    Hyperlipidemia Mother    Sleep apnea Mother    Thyroid disease Sister    Dementia Father    Heart attack Maternal Grandmother    Stroke Maternal Grandfather    Asthma Son    Healthy Son    Asthma Daughter    Healthy Daughter    Colon cancer Neg Hx    Colon polyps Neg Hx    Esophageal cancer Neg Hx    Rectal cancer Neg Hx    Stomach cancer Neg Hx     Social History   Socioeconomic History   Marital status: Divorced    Spouse name: Not on file   Number of children: 2    Years of education: 12th   Highest education level: Not on file  Occupational History   Occupation: Facilities managerBus Driver    Employer: OTHER    Comment: [employer]  Tobacco Use   Smoking status: Never   Smokeless tobacco: Never  Vaping Use   Vaping Use: Never used  Substance and Sexual Activity   Alcohol use: Yes    Comment: occasional    Drug use: No   Sexual activity: Not on file  Other Topics Concern   Not on file  Social History Narrative   Pt lives in 2 story town home with her son   Has 2 children   High school Paramedicgraduate   Bus driver for [employer]   Right handed   Social Determinants of Health   Financial Resource Strain: Not on file  Food Insecurity: Not on file  Transportation Needs: Not on file  Physical Activity: Not on file  Stress: Not on file  Social Connections: Not on file  Intimate Partner Violence: Not on file    Review of Systems:    Constitutional: No weight loss, fever or chills Cardiovascular: No chest pain Respiratory: No SOB  Gastrointestinal: See HPI and otherwise negative   Physical Exam:  Vital signs: BP 108/64 (BP Location: Left Arm, Patient Position: Sitting, Cuff Size: Normal)   Pulse 72   Ht 5' 3.75" (1.619 m) Comment: height measured without shoes  Wt 210 lb 6 oz (95.4 kg)   LMP 12/27/2003   BMI 36.39 kg/m   Constitutional:   Pleasant AA female appears to be in NAD, Well developed, Well nourished, alert and cooperative Respiratory: Respirations even and unlabored. Lungs clear to auscultation bilaterally.   No wheezes, crackles, or rhonchi.  Cardiovascular: Normal S1, S2. No MRG. Regular rate and rhythm. No peripheral edema, cyanosis or pallor.  Gastrointestinal:  Soft, nondistended, moderate epigastric ttp, No rebound or guarding. Normal bowel sounds. No appreciable masses or hepatomegaly. Psychiatric: Oriented to person, place and time. Demonstrates good judgement and reason without abnormal  affect or behaviors.  RELEVANT LABS AND  IMAGING: CBC    Component Value Date/Time   WBC 6.5 10/31/2020 0806   RBC 4.30 10/31/2020 0806   HGB 11.0 (L) 10/31/2020 0806   HGB 10.9 (L) 06/07/2020 1314   HGB 11.0 (L) 01/04/2020 1046   HCT 35.8 (L) 10/31/2020 0806   HCT 35.8 01/04/2020 1046   PLT 295 10/31/2020 0806   PLT 294 06/07/2020 1314   PLT 345 01/04/2020 1046   MCV 83.3 10/31/2020 0806   MCV 77 (L) 01/04/2020 1046   MCH 25.6 (L) 10/31/2020 0806   MCHC 30.7 10/31/2020 0806   RDW 13.2 10/31/2020 0806   RDW 13.9 01/04/2020 1046   LYMPHSABS 1.6 10/31/2020 0806   LYMPHSABS 1.9 01/04/2020 1046   MONOABS 0.4 10/31/2020 0806   EOSABS 0.0 10/31/2020 0806   EOSABS 0.1 01/04/2020 1046   BASOSABS 0.0 10/31/2020 0806   BASOSABS 0.0 01/04/2020 1046    CMP     Component Value Date/Time   NA 139 10/31/2020 0806   NA 137 01/04/2020 1046   K 4.1 10/31/2020 0806   CL 107 10/31/2020 0806   CO2 24 10/31/2020 0806   GLUCOSE 89 10/31/2020 0806   BUN 11 10/31/2020 0806   BUN 11 01/04/2020 1046   CREATININE 1.00 10/31/2020 0806   CREATININE 0.88 06/07/2020 1314   CREATININE 0.77 12/23/2016 1531   CALCIUM 9.1 10/31/2020 0806   PROT 7.9 10/31/2020 0806   PROT 7.4 01/04/2020 1046   ALBUMIN 4.5 10/31/2020 0806   ALBUMIN 4.5 01/04/2020 1046   AST 19 10/31/2020 0806   AST 22 06/07/2020 1314   ALT 20 10/31/2020 0806   ALT 23 06/07/2020 1314   ALKPHOS 46 10/31/2020 0806   BILITOT 0.4 10/31/2020 0806   BILITOT 0.3 06/07/2020 1314   GFRNONAA >60 10/31/2020 0806   GFRNONAA >60 06/07/2020 1314   GFRNONAA >89 12/23/2016 1531   GFRAA 105 01/04/2020 1046   GFRAA >89 12/23/2016 1531    Assessment: 1.  Epigastric pain: Started after recent bladder perforation and hospitalization, see HPI/chart for further details, concern over complications from this 2.  Nausea: With above 3.  Bloating 4.  Recent bladder perforation  Plan: 1.  The symptoms started after recent bladder perforation, unsure if there was some complication from  that.  Recommend patient have a repeat EGD at this time for further evaluation.  Scheduled patient with Dr. Adela Lank in the [facility].  Did provide her with a detailed list of risks for the procedure and she agrees to proceed. 2.  Increased Nexium to 40 mg twice daily, 30-60 minutes before breakfast and again before dinner.  Prescribed #60 with 3 refills. 3.  Patient does ask about need for colonoscopy, but she is not due yet. 4.  Patient return to clinic per recommendations from Dr. Adela Lank after time of procedure.  Hyacinth Meeker, PA-C [name] 11/10/2020, 2:37 PM  Cc: [name], FNP

## 2020-11-10 NOTE — Patient Instructions (Signed)
If you are age 45 or younger, your body mass index should be between 19-25. Your Body mass index is 36.39 kg/m. If this is out of the aformentioned range listed, please consider follow up with your Primary Care Provider.  __________________________________________________________  The [facility] providers would like to encourage you to use [facility] to communicate with providers for non-urgent requests or questions.  Due to long hold times on the telephone, sending your provider a message by [facility] may be a faster and more efficient way to get a response.  Please allow 48 business hours for a response.  Please remember that this is for non-urgent requests.   You have been scheduled for an endoscopy. Please follow written instructions given to you at your visit today. If you use inhalers (even only as needed), please bring them with you on the day of your procedure.  Increase your Nexium to 40 mg 1 capsule twice daily. We have sent in refills to your pharmacy  Follow up pending the results of your Endoscopy or as needed.  Thank you for entrusting me with your care and choosing [facility].  Hyacinth Meeker, PA-C

## 2020-11-10 NOTE — Progress Notes (Signed)
Agree with assessment and plan. While we wait for EGD will see how she does on higher dose PPI. If her symptoms resolve with the higher dose PPI she would not need to have the EGD done. Thanks

## 2020-11-13 ENCOUNTER — Ambulatory Visit (INDEPENDENT_AMBULATORY_CARE_PROVIDER_SITE_OTHER): Payer: 59 | Admitting: Physical Medicine and Rehabilitation

## 2020-11-13 ENCOUNTER — Encounter: Payer: Self-pay | Admitting: Physical Medicine and Rehabilitation

## 2020-11-13 ENCOUNTER — Other Ambulatory Visit: Payer: Self-pay

## 2020-11-13 DIAGNOSIS — R202 Paresthesia of skin: Secondary | ICD-10-CM | POA: Diagnosis not present

## 2020-11-13 NOTE — Progress Notes (Signed)
Pt state right shoulder pain that travels down to her right hand. Pt state holding items, pushing and pulling makes the pain worse. Pt state when she hold something cold or and cold weather she feels pain. Pt state her pinky and ring finger has numbing pain. Pt state she is right handed. Pt state she take over the counter pain meds and heating to help ease her pain.  Numeric Pain Rating Scale and Functional Assessment Average Pain 5   In the last MONTH (on 0-10 scale) has pain interfered with the following?  1. General activity like being  able to carry out your everyday physical activities such as walking, climbing stairs, carrying groceries, or moving a chair?  Rating(9)

## 2020-11-13 NOTE — Progress Notes (Signed)
[name] - 45 y.o. female MRN [id number]  Date of birth: 24-Oct-1975  Office Visit Note: Visit Date: 11/13/2020 PCP: [name], FNP Referred by: [name], FNP  Subjective: Chief Complaint  Patient presents with   Right Shoulder - Numbness, Pain   Right Hand - Pain, Numbness   HPI:  [name] is a 45 y.o. female who comes in today at the request of Dr. Glee Arvin for electrodiagnostic study of the Right upper extremities.  Patient is Right hand dominant.  She endorses pain from the right shoulder referring into the right hand and has some what nondermatomal fashion.  She reports 5 out of 10 pain in general.  She does endorse some numbness more in the ring and fifth digit.  She reports that her hand is always cold compared to the left hand.  Interestingly she is also followed by Dr. Sherryll Burger and Patsey Berthold, FNP at [facility] for what they feel is a cervical radiculitis radiculopathy.  They do have her on Norco 5mg  BID.  She has had MRI of the cervical spine and this is reviewed below.  No real nerve compression noted.  She also had prior electrodiagnostic study by Dr. at [facility] neurology in August 2021.  The patient does not remember that test when we ask her but once we got started she did remember having the test she just did not remember who the doctor was.  That test was of the right upper limb and lower limb.  That test was negative.   ROS Otherwise per HPI.  Assessment & Plan: Visit Diagnoses:    ICD-10-CM   1. Paresthesia of skin  R20.2 NCV with EMG (electromyography)      Plan:  Impression: Essentially NORMAL electrodiagnostic study of the right upper limb.  There is no significant electrodiagnostic evidence of nerve entrapment, brachial plexopathy or cervical radiculopathy.  This impression is really unchanged from prior study in 2021.  As you know, purely sensory or demyelinating radiculopathies and chemical  radiculitis may not be detected with this particular electrodiagnostic study.  Recommendations: 1.  Follow-up with referring physician. 2.  Continue current management of symptoms.  Meds & Orders: No orders of the defined types were placed in this encounter.   Orders Placed This Encounter  Procedures   NCV with EMG (electromyography)    Follow-up: Return in about 2 weeks (around 11/27/2020) for  11/29/2020, MD.   Procedures: No procedures performed  EMG & NCV Findings: All nerve conduction studies (as indicated in the following tables) were within normal limits.    All examined muscles (as indicated in the following table) showed no evidence of electrical instability.    Impression: Essentially NORMAL electrodiagnostic study of the right upper limb.  There is no significant electrodiagnostic evidence of nerve entrapment, brachial plexopathy or cervical radiculopathy.  This impression is really unchanged from prior study in 2021.  As you know, purely sensory or demyelinating radiculopathies and chemical radiculitis may not be detected with this particular electrodiagnostic study.  Recommendations: 1.  Follow-up with referring physician. 2.  Continue current management of symptoms.  ___________________________ 2022 FAAPMR Board Certified, American Board of Physical Medicine and Rehabilitation    Nerve Conduction Studies Anti Sensory Summary Table   Stim Site NR Peak (ms) Norm Peak (ms) P-T Amp (V) Norm P-T Amp Site1 Site2 Delta-P (ms) Dist (cm) Vel (m/s) Norm Vel (m/s)  Right Median Acr Palm Anti Sensory (2nd  Digit)  30.9C  Wrist    3.6 <3.6 43.6 >10 Wrist Palm 1.8 0.0    Palm    1.8 <2.0 15.8         Right Radial Anti Sensory (Base 1st Digit)  31.2C  Wrist    2.3 <3.1 37.5  Wrist Base 1st Digit 2.3 0.0    Right Ulnar Anti Sensory (5th Digit)  31C  Wrist    3.5 <3.7 38.0 >15.0 Wrist 5th Digit 3.5 14.0 40 >38   Motor Summary Table   Stim Site NR Onset (ms)  Norm Onset (ms) O-P Amp (mV) Norm O-P Amp Site1 Site2 Delta-0 (ms) Dist (cm) Vel (m/s) Norm Vel (m/s)  Right Median Motor (Abd Poll Brev)  31C  Wrist    3.5 <4.2 10.1 >5 Elbow Wrist 4.2 22.0 52 >50  Elbow    7.7  7.7         Right Ulnar Motor (Abd Dig Min)  31.4C  Wrist    3.1 <4.2 10.5 >3 B Elbow Wrist 3.5 21.0 60 >53  B Elbow    6.6  11.2  A Elbow B Elbow 1.3 10.0 77 >53  A Elbow    7.9  11.3          EMG   Side Muscle Nerve Root Ins Act Fibs Psw Amp Dur Poly Recrt Int Dennie Bible Comment  Right 1stDorInt Ulnar C8-T1 Nml Nml Nml Nml Nml 0 Nml Nml   Right Abd Poll Brev Median C8-T1 Nml Nml Nml Nml Nml 0 Nml Nml   Right ExtDigCom   Nml Nml Nml Nml Nml 0 Nml Nml   Right Triceps Radial C6-7-8 Nml Nml Nml Nml Nml 0 Nml Nml   Right Deltoid Axillary C5-6 Nml Nml Nml Nml Nml 0 Nml Nml     Nerve Conduction Studies Anti Sensory Left/Right Comparison   Stim Site L Lat (ms) R Lat (ms) L-R Lat (ms) L Amp (V) R Amp (V) L-R Amp (%) Site1 Site2 L Vel (m/s) R Vel (m/s) L-R Vel (m/s)  Median Acr Palm Anti Sensory (2nd Digit)  30.9C  Wrist  3.6   43.6  Wrist Palm     Palm  1.8   15.8        Radial Anti Sensory (Base 1st Digit)  31.2C  Wrist  2.3   37.5  Wrist Base 1st Digit     Ulnar Anti Sensory (5th Digit)  31C  Wrist  3.5   38.0  Wrist 5th Digit  40    Motor Left/Right Comparison   Stim Site L Lat (ms) R Lat (ms) L-R Lat (ms) L Amp (mV) R Amp (mV) L-R Amp (%) Site1 Site2 L Vel (m/s) R Vel (m/s) L-R Vel (m/s)  Median Motor (Abd Poll Brev)  31C  Wrist  3.5   10.1  Elbow Wrist  52   Elbow  7.7   7.7        Ulnar Motor (Abd Dig Min)  31.4C  Wrist  3.1   10.5  B Elbow Wrist  60   B Elbow  6.6   11.2  A Elbow B Elbow  77   A Elbow  7.9   11.3           Waveforms:            Clinical History: 10/13/2020  Formatting of this note might be different from the original.  MRI cervical spine without contrast:   TECHNIQUE: Sagittal T1, T2, and  STIR images were performed. Axial T2-weighted  images.   COMPARISON:  Cervical radiograph Sep 22, 2020   INDICATION: cervical radiculopathy   FINDINGS:  #  The craniocervical junction is normal.  #  Cervical alignment is preserved.  #  Vertebral body heights are well maintained.  #  The marrow signal intensity is normal.  #  No abnormal cord signal.  #  Incidental findings: None.   #  At the level of C2-3,  Normal.   #  At the level of C3-4,  Normal.   #  At the level of C4-5,  Normal.   #  At the level of C5-6,  minimal disc bulging. No significant central canal stenosis or neural foraminal.   #  At the level of C6-7,  Normal.   #  At the level of C7-T1,  Normal.    IMPRESSION:   Middle cerebral disc bulging C5-6. No significant central canal stenosis or neuroforaminal narrowing. No disc herniations.   Electronically Signed by: Abner Greenspan on 10/13/2020 8:53 AM ------------ 12/21/2019 electrodiagnostic testing of the right upper and lower extremity:  Patient Complaints: This is a 45 year old female referred for evaluation of generalized paresthesias and pain.   NCV & EMG Findings: Extensive electrodiagnostic testing of the right upper and lower extremity shows:  1. All sensory responses including the right median, ulnar, sural, and superficial peroneal nerves are within normal limits. 2. All motor responses including the right median, ulnar, peroneal, and tibial nerves are within normal limits. 3. Right tibial H reflex study is within normal limits. 4. There is no evidence of active or chronic motor axonal loss changes affecting any of the tested muscles. Motor unit configuration and recruitment pattern is within normal limits.   Impression: This is a normal study of the right upper and lower extremities. In particular, there is no evidence of a sensorimotor polyneuropathy, cervical/lumbosacral radiculopathy, carpal tunnel syndrome, or diffuse myopathy.     ___________________________ Nita Sickle, DO      Objective:  VS:  HT:    WT:   BMI:     BP:   HR: bpm  TEMP: ( )  RESP:  Physical Exam Musculoskeletal:        General: No swelling, tenderness or deformity.     Comments: Inspection reveals no atrophy of the bilateral APB or FDI or hand intrinsics. There is no swelling, color changes, allodynia or dystrophic changes.  Subjectively the right hand did feel cold.  There is 5 out of 5 strength in the bilateral wrist extension, finger abduction and long finger flexion. There is intact sensation to light touch in all dermatomal and peripheral nerve distributions. There is a negative Hoffmann's test bilaterally.  Skin:    General: Skin is warm and dry.     Findings: No erythema or rash.  Neurological:     General: No focal deficit present.     Mental Status: She is alert and oriented to person, place, and time.     Motor: No weakness or abnormal muscle tone.     Coordination: Coordination normal.  Psychiatric:        Mood and Affect: Mood normal.        Behavior: Behavior normal.     Imaging: No results found.

## 2020-11-14 NOTE — Procedures (Signed)
EMG & NCV Findings: All nerve conduction studies (as indicated in the following tables) were within normal limits.    All examined muscles (as indicated in the following table) showed no evidence of electrical instability.    Impression: Essentially NORMAL electrodiagnostic study of the right upper limb.  There is no significant electrodiagnostic evidence of nerve entrapment, brachial plexopathy or cervical radiculopathy.  This impression is really unchanged from prior study in 2021.  As you know, purely sensory or demyelinating radiculopathies and chemical radiculitis may not be detected with this particular electrodiagnostic study.  Recommendations: 1.  Follow-up with referring physician. 2.  Continue current management of symptoms.  ___________________________ Naaman Plummer FAAPMR Board Certified, American Board of Physical Medicine and Rehabilitation    Nerve Conduction Studies Anti Sensory Summary Table   Stim Site NR Peak (ms) Norm Peak (ms) P-T Amp (V) Norm P-T Amp Site1 Site2 Delta-P (ms) Dist (cm) Vel (m/s) Norm Vel (m/s)  Right Median Acr Palm Anti Sensory (2nd Digit)  30.9C  Wrist    3.6 <3.6 43.6 >10 Wrist Palm 1.8 0.0    Palm    1.8 <2.0 15.8         Right Radial Anti Sensory (Base 1st Digit)  31.2C  Wrist    2.3 <3.1 37.5  Wrist Base 1st Digit 2.3 0.0    Right Ulnar Anti Sensory (5th Digit)  31C  Wrist    3.5 <3.7 38.0 >15.0 Wrist 5th Digit 3.5 14.0 40 >38   Motor Summary Table   Stim Site NR Onset (ms) Norm Onset (ms) O-P Amp (mV) Norm O-P Amp Site1 Site2 Delta-0 (ms) Dist (cm) Vel (m/s) Norm Vel (m/s)  Right Median Motor (Abd Poll Brev)  31C  Wrist    3.5 <4.2 10.1 >5 Elbow Wrist 4.2 22.0 52 >50  Elbow    7.7  7.7         Right Ulnar Motor (Abd Dig Min)  31.4C  Wrist    3.1 <4.2 10.5 >3 B Elbow Wrist 3.5 21.0 60 >53  B Elbow    6.6  11.2  A Elbow B Elbow 1.3 10.0 77 >53  A Elbow    7.9  11.3          EMG   Side Muscle Nerve Root Ins Act Fibs Psw Amp  Dur Poly Recrt Int Dennie Bible Comment  Right 1stDorInt Ulnar C8-T1 Nml Nml Nml Nml Nml 0 Nml Nml   Right Abd Poll Brev Median C8-T1 Nml Nml Nml Nml Nml 0 Nml Nml   Right ExtDigCom   Nml Nml Nml Nml Nml 0 Nml Nml   Right Triceps Radial C6-7-8 Nml Nml Nml Nml Nml 0 Nml Nml   Right Deltoid Axillary C5-6 Nml Nml Nml Nml Nml 0 Nml Nml     Nerve Conduction Studies Anti Sensory Left/Right Comparison   Stim Site L Lat (ms) R Lat (ms) L-R Lat (ms) L Amp (V) R Amp (V) L-R Amp (%) Site1 Site2 L Vel (m/s) R Vel (m/s) L-R Vel (m/s)  Median Acr Palm Anti Sensory (2nd Digit)  30.9C  Wrist  3.6   43.6  Wrist Palm     Palm  1.8   15.8        Radial Anti Sensory (Base 1st Digit)  31.2C  Wrist  2.3   37.5  Wrist Base 1st Digit     Ulnar Anti Sensory (5th Digit)  31C  Wrist  3.5   38.0  Wrist 5th Digit  40    Motor Left/Right Comparison   Stim Site L Lat (ms) R Lat (ms) L-R Lat (ms) L Amp (mV) R Amp (mV) L-R Amp (%) Site1 Site2 L Vel (m/s) R Vel (m/s) L-R Vel (m/s)  Median Motor (Abd Poll Brev)  31C  Wrist  3.5   10.1  Elbow Wrist  52   Elbow  7.7   7.7        Ulnar Motor (Abd Dig Min)  31.4C  Wrist  3.1   10.5  B Elbow Wrist  60   B Elbow  6.6   11.2  A Elbow B Elbow  77   A Elbow  7.9   11.3           Waveforms:

## 2020-11-21 LAB — ACID FAST CULTURE WITH REFLEXED SENSITIVITIES (MYCOBACTERIA): Acid Fast Culture: NEGATIVE

## 2020-11-27 ENCOUNTER — Telehealth: Payer: Self-pay

## 2020-11-27 ENCOUNTER — Telehealth: Payer: 59

## 2020-11-27 NOTE — Telephone Encounter (Signed)
  Care Management   Follow Up Note   11/27/2020 Name: [name] MRN: [id number] DOB: 1975/10/01   Referred by: [name], FNP Reason for referral : Chronic Care Management (Unsuccessful call)   An unsuccessful telephone outreach was attempted today. The patient was referred to the case management team for assistance with care management and care coordination.  SW left a HIPAA compliant voice message requesting a return call.  Follow Up Plan: The care management team will reach out to the patient again over the next 30 days.   Bevelyn Ngo, BSW, CDP Social Worker, Certified Dementia Practitioner [facility] Care Management [phone]

## 2020-11-28 ENCOUNTER — Ambulatory Visit (INDEPENDENT_AMBULATORY_CARE_PROVIDER_SITE_OTHER): Payer: 59 | Admitting: Orthopaedic Surgery

## 2020-11-28 ENCOUNTER — Encounter: Payer: Self-pay | Admitting: Orthopaedic Surgery

## 2020-11-28 DIAGNOSIS — R202 Paresthesia of skin: Secondary | ICD-10-CM

## 2020-11-28 NOTE — Progress Notes (Signed)
Office Visit Note   Patient: [name]           Date of Birth: [birth year]           MRN: [id number] Visit Date: 11/28/2020              Requested by: [name], FNP [address] [place],  [place] [address] PCP: [name], FNP   Assessment & Plan: Visit Diagnoses:  1. Paresthesia of right arm     Plan: Impression is right upper extremity paresthesias.  The patient notes that she was recently diagnosed with Eagle syndrome which sounds like what is causing her symptoms.  Recent nerve conduction studies were unremarkable.  We have discussed diagnostic and possibly therapeutic carpal tunnel injection to the right wrist versus waiting to see what ENT has to say about her new diagnosis.  She would like to see ENT first.  If they do not think the hand paresthesias are related, she will follow-up with [place] for diagnostic carpal tunnel injection.  Otherwise, follow-up with [place] as needed.  Follow-Up Instructions: Return if symptoms worsen or fail to improve.   Orders:  No orders of the defined types were placed in this encounter.  No orders of the defined types were placed in this encounter.     Procedures: No procedures performed   Clinical Data: No additional findings.   Subjective: Chief Complaint  Patient presents with   Right Shoulder - Follow-up   Right Hand - Follow-up    HPI patient is a pleasant 45 year old female who comes in today to discuss nerve conduction studies right upper extremity.  She has been complaining of right upper extremity paresthesias for a while.  Recent cervical spine MRI was unremarkable.  She was referred out for nerve conduction study which was normal.  She has been seen by [facility] neurology where she was diagnosed with Eagle syndrome.  She was then referred to ENT and has an appointment in a couple weeks.     Objective: Vital Signs: LMP 12/27/2003     Ortho Exam unchanged right upper extremity exam  Specialty  Comments:  No specialty comments available.  Imaging: No new imaging   PMFS History: Patient Active Problem List   Diagnosis Date Noted   AKI (acute kidney injury) (HCC) 10/06/2020   Ascites 10/05/2020   Other insomnia 07/27/2020   At risk for side effect of medication 07/27/2020   Other hyperlipidemia 05/15/2020   At risk for osteoporosis 05/15/2020   At risk for impaired metabolic function 04/04/2020   Other fatigue 03/22/2020   SOBOE (shortness of breath on exertion) 03/22/2020   Exertional dyspnea 03/14/2020   Neuropathy, right side of face 03/08/2020   At risk for depression 03/08/2020   Primary osteoarthritis of both feet 02/26/2020   Vitamin D deficiency, unspecified 01/26/2020   Hyperlipidemia 01/26/2020   Prediabetes 01/26/2020   At risk for heart disease 01/26/2020   Daytime somnolence 01/26/2020   Vitamin D deficiency 01/04/2020   Fatty liver 01/04/2020   B12 deficiency 01/04/2020   Moderate persistent asthma without complication 12/31/2019   Inappropriate sinus tachycardia 12/08/2019   Class 3 severe obesity with serious comorbidity and body mass index (BMI) of 40.0 to 44.9 in adult [facility]) 12/08/2019   Apnea 12/08/2019   Anaphylactic reaction due to food, subsequent encounter 11/22/2019   Allergic rhinoconjunctivitis 11/22/2019   Idiopathic urticaria 11/22/2019   Generalized anxiety disorder 04/08/2018   Atypical chest pain    Regurgitation of  food    Laryngopharyngeal reflux (LPR) 07/30/2017   Throat soreness 07/30/2017   ANA positive 12/30/2016   Gastroesophageal reflux disease 06/27/2016   Disturbance of skin sensation 04/27/2014   Dizziness and giddiness 03/14/2014   Headache, migraine 03/14/2014   Myalgia and myositis, unspecified 06/07/2013   Nausea and vomiting in adult 08/22/2012   Viral syndrome 08/22/2012   Pelvic pain in female 12/18/2010   PID (acute pelvic inflammatory disease) 12/18/2010   ANEMIA-IRON DEFICIENCY 10/15/2006   HERPES,  GENITAL NOS 09/30/2006   Obesity (BMI 35.0-39.9 without comorbidity) 09/30/2006   DISORDER, NONORGANIC SLEEP NOS 09/30/2006   CONSTIPATION NOS 09/30/2006   NEURITIS, LUMBOSACRAL NOS 09/30/2006   HX, PERSONAL, MENTAL DISORDER NOS 09/30/2006   Past Medical History:  Diagnosis Date   Allergy    Anemia    Angio-edema    Anxiety    Apnea 12/08/2019   Arthritis    Asthma    Back pain    Constipation    Exertional dyspnea 03/14/2020   Fatty liver    GERD (gastroesophageal reflux disease)    H/O: hysterectomy    Hx of endometriosis    Inappropriate sinus tachycardia 12/08/2019   Insomnia    Joint pain    Lactose intolerance    Macromastia 07/2011   Migraine    Morbid obesity (HCC) 12/08/2019   Osteoporosis    Urticaria    Vitamin D deficiency     Family History  Problem Relation Age of Onset   Diabetes Mother    Hypertension Mother    Hyperlipidemia Mother    Sleep apnea Mother    Thyroid disease Sister    Dementia Father    Heart attack Maternal Grandmother    Stroke Maternal Grandfather    Asthma Son    Healthy Son    Asthma Daughter    Healthy Daughter    Colon cancer Neg Hx    Colon polyps Neg Hx    Esophageal cancer Neg Hx    Rectal cancer Neg Hx    Stomach cancer Neg Hx     Past Surgical History:  Procedure Laterality Date   7 HOUR PH STUDY N/A 01/12/2018   Procedure: 24 HOUR PH STUDY;  Surgeon: Napoleon Form, MD;  Location: [facility];  Service: Endoscopy;  Laterality: N/A;   ABDOMINAL HYSTERECTOMY  12/12/2003   ADENOIDECTOMY  05/2011   ANKLE ARTHROSCOPY  12/29/2007   right; with extensive debridement   BLADDER NECK RECONSTRUCTION  01/14/2011   Procedure: BLADDER NECK REPAIR;  Surgeon: Reva Bores, MD;  Location: [facility];  Service: Gynecology;  Laterality: N/A;  Laparoscopic Repair of Incidental Cystotomy   BREAST REDUCTION SURGERY  08/05/2011   Procedure: MAMMARY REDUCTION  (BREAST);  Surgeon: Louisa Second, MD;  Location: [facility];   Service: Plastics;  Laterality: Bilateral;  bilateral   BREAST SURGERY  2012   breast reduction   CESAREAN SECTION  12/29/2000; 1994   DILATION AND CURETTAGE OF UTERUS  07/08/2003   open laparoscopy   ESOPHAGEAL MANOMETRY N/A 01/12/2018   Procedure: ESOPHAGEAL MANOMETRY (EM);  Surgeon: Napoleon Form, MD;  [facility];  Service: Endoscopy;  Laterality: N/A;   GIVENS CAPSULE STUDY N/A 06/23/2017   Procedure: GIVENS CAPSULE STUDY;  Surgeon: Benancio Deeds, MD;  Location: [facility];  Service: Gastroenterology;  Laterality: N/A;   LAPAROSCOPIC SALPINGOOPHERECTOMY  01/14/2011   left   PH IMPEDANCE STUDY N/A 01/12/2018   Procedure: PH IMPEDANCE STUDY;  Surgeon: Lavon Paganini,  Eleonore Chiquito, MD;  Location: [facility];  Service: Endoscopy;  Laterality: N/A;   REPAIR PERONEAL TENDONS ANKLE  01/03/2010   repair right subluxing peroneal tendons   RESECTION DISTAL CLAVICAL Right 09/25/2017   Procedure: RESECTION DISTAL CLAVICAL;  Surgeon: Bjorn Pippin, MD;  Location: [facility];  Service: Orthopedics;  Laterality: Right;   RIGHT OOPHORECTOMY     with lysis of adhesions   SHOULDER ACROMIOPLASTY Right 09/25/2017   Procedure: SHOULDER ACROMIOPLASTY;  Surgeon: Bjorn Pippin, MD;  Location: [facility];  Service: Orthopedics;  Laterality: Right;   SHOULDER ARTHROSCOPY WITH DEBRIDEMENT AND BICEP TENDON REPAIR Right 09/25/2017   Procedure: SHOULDER ARTHROSCOPY WITH DEBRIDEMENT AND BICEP TENDON REPAIR;  Surgeon: Bjorn Pippin, MD;  Location: [facility];  Service: Orthopedics;  Laterality: Right;   SHOULDER ARTHROSCOPY WITH ROTATOR CUFF REPAIR Right 09/25/2017   Procedure: SHOULDER ARTHROSCOPY WITH ROTATOR CUFF REPAIR;  Surgeon: Bjorn Pippin, MD;  Location: [facility];  Service: Orthopedics;  Laterality: Right;   TONSILLECTOMY  as a child   TUBAL LIGATION  12/29/2000   UPPER GASTROINTESTINAL ENDOSCOPY     Social History   Occupational History    Occupation: Facilities manager: OTHER    Comment: [employer]  Tobacco Use   Smoking status: Never   Smokeless tobacco: Never  Vaping Use   Vaping Use: Never used  Substance and Sexual Activity   Alcohol use: Yes    Comment: occasional    Drug use: No   Sexual activity: Not on file

## 2020-12-01 ENCOUNTER — Ambulatory Visit (HOSPITAL_COMMUNITY)
Admission: EM | Admit: 2020-12-01 | Discharge: 2020-12-01 | Disposition: A | Discharge status: Home / Self Care | Payer: 59

## 2020-12-01 ENCOUNTER — Telehealth: Payer: 59

## 2020-12-01 ENCOUNTER — Other Ambulatory Visit: Payer: Self-pay

## 2020-12-01 ENCOUNTER — Telehealth: Payer: Self-pay

## 2020-12-01 ENCOUNTER — Encounter (HOSPITAL_COMMUNITY): Payer: Self-pay

## 2020-12-01 DIAGNOSIS — R21 Rash and other nonspecific skin eruption: Secondary | ICD-10-CM | POA: Diagnosis not present

## 2020-12-01 MED ORDER — VALACYCLOVIR HCL 1 G PO TABS: 1000.0000 mg | ORAL_TABLET | Freq: Two times a day (BID) | ORAL | 0 refills | Status: AC

## 2020-12-01 NOTE — ED Provider Notes (Addendum)
[facility]    CSN: [id number] Arrival date & time: 12/01/20  1907      History   Chief Complaint Chief Complaint  Patient presents with   Rash    HPI [name] is a 45 y.o. female.   HPI  Rash: Patient states that over the past 2 days she has had a itchy and painful rash of her left side.  This started after 4 days of taking Bactrim antibiotic for urinary tract infection.  She stopped it to see if this was the cause of her symptoms.  No change.  She denies any trouble breathing or swallowing.  She has not tried anything for the rash itself.  Past Medical History:  Diagnosis Date   Allergy    Anemia    Angio-edema    Anxiety    Apnea 12/08/2019   Arthritis    Asthma    Back pain    Constipation    Exertional dyspnea 03/14/2020   Fatty liver    GERD (gastroesophageal reflux disease)    H/O: hysterectomy    Hx of endometriosis    Inappropriate sinus tachycardia 12/08/2019   Insomnia    Joint pain    Lactose intolerance    Macromastia 07/2011   Migraine    Morbid obesity (HCC) 12/08/2019   Osteoporosis    Urticaria    Vitamin D deficiency     Patient Active Problem List   Diagnosis Date Noted   AKI (acute kidney injury) (HCC) 10/06/2020   Ascites 10/05/2020   Other insomnia 07/27/2020   At risk for side effect of medication 07/27/2020   Other hyperlipidemia 05/15/2020   At risk for osteoporosis 05/15/2020   At risk for impaired metabolic function 04/04/2020   Other fatigue 03/22/2020   SOBOE (shortness of breath on exertion) 03/22/2020   Exertional dyspnea 03/14/2020   Neuropathy, right side of face 03/08/2020   At risk for depression 03/08/2020   Primary osteoarthritis of both feet 02/26/2020   Vitamin D deficiency, unspecified 01/26/2020   Hyperlipidemia 01/26/2020   Prediabetes 01/26/2020   At risk for heart disease 01/26/2020   Daytime somnolence 01/26/2020   Vitamin D deficiency 01/04/2020   Fatty liver 01/04/2020   B12 deficiency  01/04/2020   Moderate persistent asthma without complication 12/31/2019   Inappropriate sinus tachycardia 12/08/2019   Class 3 severe obesity with serious comorbidity and body mass index (BMI) of 40.0 to 44.9 in adult (HCC) 12/08/2019   Apnea 12/08/2019   Anaphylactic reaction due to food, subsequent encounter 11/22/2019   Allergic rhinoconjunctivitis 11/22/2019   Idiopathic urticaria 11/22/2019   Generalized anxiety disorder 04/08/2018   Atypical chest pain    Regurgitation of food    Laryngopharyngeal reflux (LPR) 07/30/2017   Throat soreness 07/30/2017   ANA positive 12/30/2016   Gastroesophageal reflux disease 06/27/2016   Disturbance of skin sensation 04/27/2014   Dizziness and giddiness 03/14/2014   Headache, migraine 03/14/2014   Myalgia and myositis, unspecified 06/07/2013   Nausea and vomiting in adult 08/22/2012   Viral syndrome 08/22/2012   Pelvic pain in female 12/18/2010   PID (acute pelvic inflammatory disease) 12/18/2010   ANEMIA-IRON DEFICIENCY 10/15/2006   HERPES, GENITAL NOS 09/30/2006   Obesity (BMI 35.0-39.9 without comorbidity) 09/30/2006   DISORDER, NONORGANIC SLEEP NOS 09/30/2006   CONSTIPATION NOS 09/30/2006   NEURITIS, LUMBOSACRAL NOS 09/30/2006   HX, PERSONAL, MENTAL DISORDER NOS 09/30/2006    Past Surgical History:  Procedure Laterality Date   25 HOUR PH  STUDY N/A 01/12/2018   Procedure: 24 HOUR PH STUDY;  Surgeon: Napoleon Form, MD;  [facility];  Service: Endoscopy;  Laterality: N/A;   ABDOMINAL HYSTERECTOMY  12/12/2003   ADENOIDECTOMY  05/2011   ANKLE ARTHROSCOPY  12/29/2007   right; with extensive debridement   BLADDER NECK RECONSTRUCTION  01/14/2011   Procedure: BLADDER NECK REPAIR;  Surgeon: Reva Bores, MD;  [facility]: [facility];  Service: Gynecology;  Laterality: N/A;  Laparoscopic Repair of Incidental Cystotomy   BREAST REDUCTION SURGERY  08/05/2011   Procedure: MAMMARY REDUCTION  (BREAST);  Surgeon: Louisa Second, MD;  Location:  [facility];  Service: Plastics;  Laterality: Bilateral;  bilateral   BREAST SURGERY  2012   breast reduction   CESAREAN SECTION  12/29/2000; 1994   DILATION AND CURETTAGE OF UTERUS  07/08/2003   open laparoscopy   ESOPHAGEAL MANOMETRY N/A 01/12/2018   Procedure: ESOPHAGEAL MANOMETRY (EM);  Surgeon: Napoleon Form, MD;  [facility];  Service: Endoscopy;  Laterality: N/A;   GIVENS CAPSULE STUDY N/A 06/23/2017   Procedure: GIVENS CAPSULE STUDY;  Surgeon: Benancio Deeds, MD;  Location: [facility];  Service: Gastroenterology;  Laterality: N/A;   LAPAROSCOPIC SALPINGOOPHERECTOMY  01/14/2011   left   PH IMPEDANCE STUDY N/A 01/12/2018   Procedure: PH IMPEDANCE STUDY;  Surgeon: Napoleon Form, MD;  [facility];  Service: Endoscopy;  Laterality: N/A;   REPAIR PERONEAL TENDONS ANKLE  01/03/2010   repair right subluxing peroneal tendons   RESECTION DISTAL CLAVICAL Right 09/25/2017   Procedure: RESECTION DISTAL CLAVICAL;  Surgeon: Bjorn Pippin, MD;  Location: [facility];  Service: Orthopedics;  Laterality: Right;   RIGHT OOPHORECTOMY     with lysis of adhesions   SHOULDER ACROMIOPLASTY Right 09/25/2017   Procedure: SHOULDER ACROMIOPLASTY;  Surgeon: Bjorn Pippin, MD;  Location: [facility];  Service: Orthopedics;  Laterality: Right;   SHOULDER ARTHROSCOPY WITH DEBRIDEMENT AND BICEP TENDON REPAIR Right 09/25/2017   Procedure: SHOULDER ARTHROSCOPY WITH DEBRIDEMENT AND BICEP TENDON REPAIR;  Surgeon: Bjorn Pippin, MD;  Location: [facility];  Service: Orthopedics;  Laterality: Right;   SHOULDER ARTHROSCOPY WITH ROTATOR CUFF REPAIR Right 09/25/2017   Procedure: SHOULDER ARTHROSCOPY WITH ROTATOR CUFF REPAIR;  Surgeon: Bjorn Pippin, MD;  Location: [facility];  Service: Orthopedics;  Laterality: Right;   TONSILLECTOMY  as a child   TUBAL LIGATION  12/29/2000   UPPER GASTROINTESTINAL ENDOSCOPY      OB  History     Gravida  2   Para  2   Term  2   Preterm      AB      Living  2      SAB      IAB      Ectopic      Multiple      Live Births               Home Medications    Prior to Admission medications   Medication Sig Start Date End Date Taking? Authorizing Provider  esomeprazole (NEXIUM) 40 MG capsule Take 1 capsule (40 mg total) by mouth 2 (two) times daily before a meal. 11/10/20  Yes Lemmon, Violet Baldy, PA  sulfamethoxazole-trimethoprim (BACTRIM DS) 800-160 MG tablet Take 1 tablet by mouth 2 (two) times daily. 11/23/20  Yes [provider]  acetaminophen (TYLENOL) 500 MG tablet Take 1,000 mg by mouth every 6 (six) hours as needed  for moderate pain.    [provider]  albuterol (VENTOLIN HFA) 108 (90 Base) MCG/ACT inhaler Inhale 2 puffs into the lungs every 6 (six) hours as needed for wheezing or shortness of breath. 01/25/20   Charlott Holler, MD  calcium carbonate (OS-CAL - DOSED IN MG OF ELEMENTAL CALCIUM) 1250 (500 Ca) MG tablet Take 1 tablet by mouth.    [provider]  cetirizine (ZYRTEC) 10 MG tablet TAKE 1 TABLET (10 MG TOTAL) BY MOUTH DAILY AS NEEDED FOR RHINITIS. 05/16/20   Ambs, Norvel Richards, FNP  clindamycin (CLEOCIN T) 1 % lotion Apply 1 application topically daily.    [provider]  diclofenac Sodium (VOLTAREN) 1 % GEL Apply 2 g topically 4 (four) times daily as needed (musculoskeletal pain). 10/09/20   Zigmund Daniel., MD  EPINEPHrine (EPIPEN 2-PAK) 0.3 mg/0.3 mL IJ SOAJ injection Inject 0.3 mg into the muscle as needed for anaphylaxis. 07/07/20   Hetty Blend, FNP  famotidine (PEPCID) 20 MG tablet Take 20 mg by mouth daily. 01/22/20   [provider]  fluticasone furoate-vilanterol (BREO ELLIPTA) 100-25 MCG/INH AEPB Inhale 1 puff into the lungs daily as needed.    [provider]  HYDROcodone-acetaminophen (NORCO/VICODIN) 5-325 MG tablet Take 1 tablet by mouth every 6 (six) hours as needed for  severe pain. 09/15/20   [provider]  ondansetron (ZOFRAN) 4 MG tablet Take 1 tablet (4 mg total) by mouth daily as needed for nausea or vomiting. 04/19/20 04/19/21  [name], FNP  RESTASIS 0.05 % ophthalmic emulsion Place 1 drop into both eyes 2 (two) times daily. 11/04/19   [provider]  silver sulfADIAZINE (SILVADENE) 1 % cream SMARTSIG:0.0625 Inch(es) Topical Twice Daily 09/13/20   [provider]  sucralfate (CARAFATE) 1 g tablet TAKE 1 TABLET (1 G TOTAL) BY MOUTH EVERY 6 (SIX) HOURS. SLOWLY DISSOLVE TABLET IN 1 TABLESPOON OF DISTILLED WATER BEFORE INGESTION 04/21/20   Armbruster, Willaim Rayas, MD  tretinoin (RETIN-A) 0.025 % cream tretinoin 0.025 % topical cream  APPLY APPLICATIONS APPLY A PEA-SIZED AMOUNT TO FACE AT BEDTIME    [provider]  Vitamin D, Ergocalciferol, (DRISDOL) 1.25 MG (50000 UNIT) CAPS capsule Take 1 capsule (50,000 Units total) by mouth every 7 (seven) days. 10/25/20   [name], FNP  zolpidem (AMBIEN CR) 12.5 MG CR tablet Take 12.5 mg by mouth at bedtime as needed for sleep. 09/13/19   [provider]    Family History Family History  Problem Relation Age of Onset   Diabetes Mother    Hypertension Mother    Hyperlipidemia Mother    Sleep apnea Mother    Thyroid disease Sister    Dementia Father    Heart attack Maternal Grandmother    Stroke Maternal Grandfather    Asthma Son    Healthy Son    Asthma Daughter    Healthy Daughter    Colon cancer Neg Hx    Colon polyps Neg Hx    Esophageal cancer Neg Hx    Rectal cancer Neg Hx    Stomach cancer Neg Hx     Social History Social History   Tobacco Use   Smoking status: Never   Smokeless tobacco: Never  Vaping Use   Vaping Use: Never used  Substance Use Topics   Alcohol use: Yes    Comment: occasional    Drug use: No     Allergies   Aspirin, Ibuprofen, Salmon [fish allergy], and Zoledronic acid  Review of Systems Review of Systems  As  stated above in HPI Physical Exam Triage Vital Signs ED Triage Vitals  Enc Vitals Group     BP 12/01/20 1943 115/71     Pulse Rate 12/01/20 1943 72     Resp 12/01/20 1943 18     Temp 12/01/20 1943 98.5 F (36.9 C)     Temp Source 12/01/20 1943 Oral     SpO2 12/01/20 1943 100 %     Weight --      Height --      Head Circumference --      Peak Flow --      Pain Score 12/01/20 1945 5     Pain Loc --      Pain Edu? --      Excl. in GC? --    No data found.  Updated Vital Signs BP 115/71 (BP Location: Right Arm)   Pulse 72   Temp 98.5 F (36.9 C) (Oral)   Resp 18   LMP 12/27/2003   SpO2 100%   Physical Exam Vitals and nursing note reviewed.  Constitutional:      General: She is not in acute distress.    Appearance: Normal appearance. She is not ill-appearing, toxic-appearing or diaphoretic.  Skin:    Comments: Vesicular rash of the left lower abdomen  Neurological:     Mental Status: She is alert.     UC Treatments / Results  Labs (all labs ordered are listed, but only abnormal results are displayed) Labs Reviewed - No data to display  EKG   Radiology No results found.  Procedures Procedures (including critical care time)  Medications Ordered in UC Medications - No data to display  Initial Impression / Assessment and Plan / UC Course  I have reviewed the triage vital signs and the nursing notes.  Pertinent labs & imaging results that were available during my care of the patient were reviewed by me and considered in my medical decision making (see chart for details).     New.  Likely shingles although the vesicles are a bit bigger than typical and some areas.  I am going to treat as shingles and we are going to have her stop the Bactrim just in case.  I am not can add this to her allergy list but we did discuss that she needs to write this down as a potential allergy and we should reduce the risk of using this in the future if possible.  Discussed  shingles in detail.  Follow-up as needed. Final Clinical Impressions(s) / UC Diagnoses   Final diagnoses:  None   Discharge Instructions   None    ED Prescriptions   None    PDMP not reviewed this encounter.   Rushie ChestnutCovington, Shakema Surita M, Cordelia Poche-C 12/01/20 2013    [address]Jacara Benito M, PA-C 12/01/20 2015

## 2020-12-01 NOTE — ED Triage Notes (Addendum)
Pt c/o painful rash right left lower abd that started two days ago, it does itch. Pt is half way through antibiotic treatment (bactrim).

## 2020-12-01 NOTE — Telephone Encounter (Signed)
  Care Management   Follow Up Note   12/01/2020 Name: [name] MRN: [id number] DOB: 25-Aug-1975   Referred by: [name], FNP Reason for referral : Care Coordination (Initial RN CM Outreach - 2nd attempt )   A second unsuccessful telephone outreach was attempted today. The patient was referred to the case management team for assistance with care management and care coordination.   Follow Up Plan: A HIPPA compliant phone message was left for the patient providing contact information and requesting a return call.   Delsa Sale, RN, BSN, CCM Care Management Coordinator [facility]  Direct Phone: [phone]

## 2020-12-05 ENCOUNTER — Ambulatory Visit: Payer: 59 | Admitting: Primary Care

## 2020-12-08 ENCOUNTER — Telehealth: Payer: Self-pay | Admitting: *Deleted

## 2020-12-08 NOTE — Chronic Care Management (AMB) (Signed)
  Care Management   Note  12/08/2020 Name: [name] MRN: [id number] DOB: [birth year]  [name] is a 45 y.o. year old female who is a primary care patient of [name], FNP and is actively engaged with the care management team. I reached out to [employer] by phone today to assist with re-scheduling an initial visit with the BSW  Follow up plan: Unsuccessful telephone outreach attempt made. A HIPAA compliant phone message was left for the patient providing contact information and requesting a return call.  The care management team will reach out to the patient again over the next 7 days.  If patient returns call to provider office, please advise to call Embedded Care Management Care Guide Misty Stanley at [phone].  Gwenevere Ghazi  Care Guide, Embedded Care Coordination [facility]  Direct Dial: [phone]

## 2020-12-13 DIAGNOSIS — M2420 Disorder of ligament, unspecified site: Secondary | ICD-10-CM | POA: Insufficient documentation

## 2020-12-18 ENCOUNTER — Other Ambulatory Visit: Payer: Self-pay

## 2020-12-18 ENCOUNTER — Ambulatory Visit: Payer: 59 | Admitting: Primary Care

## 2020-12-18 ENCOUNTER — Ambulatory Visit (INDEPENDENT_AMBULATORY_CARE_PROVIDER_SITE_OTHER): Payer: 59 | Admitting: Primary Care

## 2020-12-18 DIAGNOSIS — J4540 Moderate persistent asthma, uncomplicated: Secondary | ICD-10-CM | POA: Diagnosis not present

## 2020-12-18 MED ORDER — FLUTICASONE FUROATE-VILANTEROL 100-25 MCG/INH IN AEPB: 1.0000 | INHALATION_SPRAY | Freq: Every day | RESPIRATORY_TRACT | 11 refills | Status: DC | PRN

## 2020-12-18 NOTE — Progress Notes (Signed)
@Patient  ID: , female    DOB: 17-Dec-1975, 45 y.o.   MRN: [id number]  No chief complaint on file.   Referring provider: [id number], FNP  HPI: 45 year old female, never smoked.  Past medical history significant for moderate persistent asthma, LPR, GERD. Patient of Dr. 54. Maintained on BREO 100, prn albuterol, flonase and oral antihistamine.   Previous [facility] pulmonary encounter:  02/22/2020 Patient presents today for acute visit with reports of increased shortness of breath x 2 weeks. Associated wheezing, PND and nasal congestion. She is compliant with Breo 100 one puff daily. Uses albuterol twice a week on average, states that it makes her dizziness and HR race. She does not notice a significant improvement with SABA. She is using Zyrtec and flonase. She is working on weight loss. She gets tested for Covid weekly at work, negative on 02/16/20. She is due for another COVID test tomorrow 02/23/20. She has gained some weight and is not currently physically active. She is working on weight loss with diet. CT calcium score was 0. She has an apt with rheumatology for 03/06/20 for positive ANA.   12/18/2020- Interim hx  Patient presents today overdue 6 month follow-up. She is doing well, reports occasional shortness of breath with moderate exercise and very rarely experiences wheezing or chest tightness. No dyspnea with ADLs. She reports some trouble swallowing, undergoing EGD tomorrow with Gastroenterology. She is compliant with Breo 12/20/2020 daily. She does not regularly use her Albuterol rescue inhaler. ACT 21.   Pulmonary function testing: 03/12/17 - PFTs showed no airflow limitation but there was a significant response to bronchodilator challenge. 12/2019- Spirometry shows normal spirometry.   Labs: IgE 33, environmental IgE panel negative (immunocap) in 2019.   Allergies  Allergen Reactions   Aspirin    Ibuprofen    Salmon [Fish Allergy]    Zoledronic Acid Other (See  Comments)    Immunization History  Administered Date(s) Administered   Influenza,inj,Quad PF,6+ Mos 02/15/2016, 12/24/2017, 02/24/2019, 04/20/2020   Td 05/06/1994   Tdap 12/06/2015    Past Medical History:  Diagnosis Date   Allergy    Anemia    Angio-edema    Anxiety    Apnea 12/08/2019   Arthritis    Asthma    Back pain    Constipation    Exertional dyspnea 03/14/2020   Fatty liver    GERD (gastroesophageal reflux disease)    H/O: hysterectomy    Hx of endometriosis    Inappropriate sinus tachycardia 12/08/2019   Insomnia    Joint pain    Lactose intolerance    Macromastia 07/2011   Migraine    Morbid obesity (HCC) 12/08/2019   Osteoporosis    Urticaria    Vitamin D deficiency     Tobacco History: Social History   Tobacco Use  Smoking Status Never  Smokeless Tobacco Never   Counseling given: Not Answered   Outpatient Medications Prior to Visit  Medication Sig Dispense Refill   acetaminophen (TYLENOL) 500 MG tablet Take 1,000 mg by mouth every 6 (six) hours as needed for moderate pain.     albuterol (VENTOLIN HFA) 108 (90 Base) MCG/ACT inhaler Inhale 2 puffs into the lungs every 6 (six) hours as needed for wheezing or shortness of breath. 1 each 11   calcium carbonate (OS-CAL - DOSED IN MG OF ELEMENTAL CALCIUM) 1250 (500 Ca) MG tablet Take 1 tablet by mouth.     cetirizine (ZYRTEC) 10 MG tablet TAKE 1 TABLET (10 MG TOTAL)  BY MOUTH DAILY AS NEEDED FOR RHINITIS. 90 tablet 1   clindamycin (CLEOCIN T) 1 % lotion Apply 1 application topically daily.     diclofenac Sodium (VOLTAREN) 1 % GEL Apply 2 g topically 4 (four) times daily as needed (musculoskeletal pain). 50 g 0   EPINEPHrine (EPIPEN 2-PAK) 0.3 mg/0.3 mL IJ SOAJ injection Inject 0.3 mg into the muscle as needed for anaphylaxis. 2 each 2   esomeprazole (NEXIUM) 40 MG capsule Take 1 capsule (40 mg total) by mouth 2 (two) times daily before a meal. 60 capsule 3   famotidine (PEPCID) 20 MG tablet Take 20 mg by mouth  daily.     ondansetron (ZOFRAN) 4 MG tablet Take 1 tablet (4 mg total) by mouth daily as needed for nausea or vomiting. 30 tablet 0   RESTASIS 0.05 % ophthalmic emulsion Place 1 drop into both eyes 2 (two) times daily.     silver sulfADIAZINE (SILVADENE) 1 % cream SMARTSIG:0.0625 Inch(es) Topical Twice Daily     sucralfate (CARAFATE) 1 g tablet TAKE 1 TABLET (1 G TOTAL) BY MOUTH EVERY 6 (SIX) HOURS. SLOWLY DISSOLVE TABLET IN 1 TABLESPOON OF DISTILLED WATER BEFORE INGESTION 90 tablet 2   tretinoin (RETIN-A) 0.025 % cream tretinoin 0.025 % topical cream  APPLY APPLICATIONS APPLY A PEA-SIZED AMOUNT TO FACE AT BEDTIME     Vitamin D, Ergocalciferol, (DRISDOL) 1.25 MG (50000 UNIT) CAPS capsule Take 1 capsule (50,000 Units total) by mouth every 7 (seven) days. 15 capsule 3   fluticasone furoate-vilanterol (BREO ELLIPTA) 100-25 MCG/INH AEPB Inhale 1 puff into the lungs daily as needed.     HYDROcodone-acetaminophen (NORCO/VICODIN) 5-325 MG tablet Take 1 tablet by mouth every 6 (six) hours as needed for severe pain.     sulfamethoxazole-trimethoprim (BACTRIM DS) 800-160 MG tablet Take 1 tablet by mouth 2 (two) times daily.     zolpidem (AMBIEN CR) 12.5 MG CR tablet Take 12.5 mg by mouth at bedtime as needed for sleep.     No facility-administered medications prior to visit.    Review of Systems  Review of Systems  Constitutional: Negative.   HENT:  Positive for trouble swallowing.   Respiratory:  Negative for cough, chest tightness, shortness of breath and wheezing.   Cardiovascular: Negative.     Physical Exam  BP 118/74 (BP Location: Right Arm, Cuff Size: Normal)   Pulse 72   LMP 12/27/2003   SpO2 100%  Physical Exam Constitutional:      Appearance: Normal appearance.  HENT:     Head: Normocephalic and atraumatic.     Mouth/Throat:     Mouth: Mucous membranes are moist.     Pharynx: Oropharynx is clear.  Cardiovascular:     Rate and Rhythm: Normal rate and regular rhythm.   Pulmonary:     Effort: Pulmonary effort is normal.     Breath sounds: Normal breath sounds. No wheezing, rhonchi or rales.  Musculoskeletal:     Cervical back: Normal range of motion and neck supple.  Skin:    General: Skin is warm and dry.  Neurological:     General: No focal deficit present.     Mental Status: She is alert and oriented to person, place, and time. Mental status is at baseline.  Psychiatric:        Mood and Affect: Mood normal.        Behavior: Behavior normal.        Thought Content: Thought content normal.  Judgment: Judgment normal.     Lab Results:  CBC    Component Value Date/Time   WBC 6.5 10/31/2020 0806   RBC 4.30 10/31/2020 0806   HGB 11.0 (L) 10/31/2020 0806   HGB 10.9 (L) 06/07/2020 1314   HGB 11.0 (L) 01/04/2020 1046   HCT 35.8 (L) 10/31/2020 0806   HCT 35.8 01/04/2020 1046   PLT 295 10/31/2020 0806   PLT 294 06/07/2020 1314   PLT 345 01/04/2020 1046   MCV 83.3 10/31/2020 0806   MCV 77 (L) 01/04/2020 1046   MCH 25.6 (L) 10/31/2020 0806   MCHC 30.7 10/31/2020 0806   RDW 13.2 10/31/2020 0806   RDW 13.9 01/04/2020 1046   LYMPHSABS 1.6 10/31/2020 0806   LYMPHSABS 1.9 01/04/2020 1046   MONOABS 0.4 10/31/2020 0806   EOSABS 0.0 10/31/2020 0806   EOSABS 0.1 01/04/2020 1046   BASOSABS 0.0 10/31/2020 0806   BASOSABS 0.0 01/04/2020 1046    BMET    Component Value Date/Time   NA 139 10/31/2020 0806   NA 137 01/04/2020 1046   K 4.1 10/31/2020 0806   CL 107 10/31/2020 0806   CO2 24 10/31/2020 0806   GLUCOSE 89 10/31/2020 0806   BUN 11 10/31/2020 0806   BUN 11 01/04/2020 1046   CREATININE 1.00 10/31/2020 0806   CREATININE 0.88 06/07/2020 1314   CREATININE 0.77 12/23/2016 1531   CALCIUM 9.1 10/31/2020 0806   GFRNONAA >60 10/31/2020 0806   GFRNONAA >60 06/07/2020 1314   GFRNONAA >89 12/23/2016 1531   GFRAA 105 01/04/2020 1046   GFRAA >89 12/23/2016 1531    BNP    Component Value Date/Time   BNP 9.9 03/17/2018 1312     ProBNP    Component Value Date/Time   PROBNP 19.6 09/16/2012 0105    Imaging: No results found.   Assessment & Plan:   Moderate persistent asthma without complication - Stable interval; No acute respiratory symptoms or recent exacerbations. Rare SABA use. Continue Breo one puff daily and prn Albuterol hfa. Refills provided. FU in 1 year or sooner if needed.      Glenford Bayley, NP 12/19/2020

## 2020-12-18 NOTE — Patient Instructions (Addendum)
Nice seeing you today Ms Gleed  Recommendations: Continue BREO 100- take one puff daily in the morning Use albuterol rescue inhaler 2 puffs every 6 hours as needed for breakthrough shortness of breath/wheezing Continue Flonase nasal and over the counter antihistamine as needed for allergy symptoms   Rx: Refill Breo   Follow-up: 1 year with Dr. Celine Mans

## 2020-12-19 ENCOUNTER — Ambulatory Visit (AMBULATORY_SURGERY_CENTER): Payer: 59 | Admitting: Gastroenterology

## 2020-12-19 ENCOUNTER — Encounter: Payer: Self-pay | Admitting: Gastroenterology

## 2020-12-19 VITALS — BP 111/78 | HR 61 | Temp 97.5°F | Resp 14 | Ht 63.75 in | Wt 210.0 lb

## 2020-12-19 DIAGNOSIS — K449 Diaphragmatic hernia without obstruction or gangrene: Secondary | ICD-10-CM

## 2020-12-19 DIAGNOSIS — K2951 Unspecified chronic gastritis with bleeding: Secondary | ICD-10-CM | POA: Diagnosis not present

## 2020-12-19 DIAGNOSIS — K2970 Gastritis, unspecified, without bleeding: Secondary | ICD-10-CM

## 2020-12-19 DIAGNOSIS — R1013 Epigastric pain: Secondary | ICD-10-CM

## 2020-12-19 DIAGNOSIS — R110 Nausea: Secondary | ICD-10-CM

## 2020-12-19 MED ORDER — SODIUM CHLORIDE 0.9 % IV SOLN: 500.0000 mL | Freq: Once | INTRAVENOUS | Status: DC

## 2020-12-19 NOTE — Progress Notes (Signed)
[facility] History and Physical   Primary Care Physician:  [name], FNP   Reason for Procedure:   Epigastric pain, nausea  Plan:    EGD     HPI: [name] is a 45 y.o. female with a history of GERD and medical problems as outlined, here for EGD to further evaluate persistent postprandial epigastric pain and nausea. Has occurred in recent months following bladder perforation. She has recovered from the bladder issue. Denies cardiopulmonary complaints. Discussed risks / benefits of endoscopy and anesthesia and she wishes to proceed.    Past Medical History:  Diagnosis Date   Allergy    Anemia    Angio-edema    Anxiety    Apnea 12/08/2019   Arthritis    Asthma    Back pain    Constipation    Exertional dyspnea 03/14/2020   Fatty liver    GERD (gastroesophageal reflux disease)    H/O: hysterectomy    Hx of endometriosis    Inappropriate sinus tachycardia 12/08/2019   Insomnia    Joint pain    Lactose intolerance    Macromastia 07/2011   Migraine    Morbid obesity (HCC) 12/08/2019   Osteoporosis    Urticaria    Vitamin D deficiency     Past Surgical History:  Procedure Laterality Date   79 HOUR PH STUDY N/A 01/12/2018   Procedure: 24 HOUR PH STUDY;  Surgeon: Napoleon Form, MD;  Location: [facility];  Service: Endoscopy;  Laterality: N/A;   ABDOMINAL HYSTERECTOMY  12/12/2003   ADENOIDECTOMY  05/2011   ANKLE ARTHROSCOPY  12/29/2007   right; with extensive debridement   BLADDER NECK RECONSTRUCTION  01/14/2011   Procedure: BLADDER NECK REPAIR;  Surgeon: Reva Bores, MD;  Location: [facility];  Service: Gynecology;  Laterality: N/A;  Laparoscopic Repair of Incidental Cystotomy   BREAST REDUCTION SURGERY  08/05/2011   Procedure: MAMMARY REDUCTION  (BREAST);  Surgeon: Louisa Second, MD;  Location: [facility];  Service: Plastics;  Laterality: Bilateral;  bilateral   BREAST SURGERY  2012   breast reduction   CESAREAN SECTION  12/29/2000;  1994   DILATION AND CURETTAGE OF UTERUS  07/08/2003   open laparoscopy   ESOPHAGEAL MANOMETRY N/A 01/12/2018   Procedure: ESOPHAGEAL MANOMETRY (EM);  Surgeon: Napoleon Form, MD;  Location: [facility];  Service: Endoscopy;  Laterality: N/A;   GIVENS CAPSULE STUDY N/A 06/23/2017   Procedure: GIVENS CAPSULE STUDY;  Surgeon: Benancio Deeds, MD;  Location: [facility];  Service: Gastroenterology;  Laterality: N/A;   LAPAROSCOPIC SALPINGOOPHERECTOMY  01/14/2011   left   PH IMPEDANCE STUDY N/A 01/12/2018   Procedure: PH IMPEDANCE STUDY;  Surgeon: Napoleon Form, MD;  Location: [facility];  Service: Endoscopy;  Laterality: N/A;   REPAIR PERONEAL TENDONS ANKLE  01/03/2010   repair right subluxing peroneal tendons   RESECTION DISTAL CLAVICAL Right 09/25/2017   Procedure: RESECTION DISTAL CLAVICAL;  Surgeon: Bjorn Pippin, MD;  Location: [facility];  Service: Orthopedics;  Laterality: Right;   RIGHT OOPHORECTOMY     with lysis of adhesions   SHOULDER ACROMIOPLASTY Right 09/25/2017   Procedure: SHOULDER ACROMIOPLASTY;  Surgeon: Bjorn Pippin, MD;  Location: [facility];  Service: Orthopedics;  Laterality: Right;   SHOULDER ARTHROSCOPY WITH DEBRIDEMENT AND BICEP TENDON REPAIR Right 09/25/2017   Procedure: SHOULDER ARTHROSCOPY WITH DEBRIDEMENT AND BICEP TENDON REPAIR;  Surgeon: Bjorn Pippin, MD;  Location: [facility];  Service:  Orthopedics;  Laterality: Right;   SHOULDER ARTHROSCOPY WITH ROTATOR CUFF REPAIR Right 09/25/2017   Procedure: SHOULDER ARTHROSCOPY WITH ROTATOR CUFF REPAIR;  Surgeon: Bjorn PippinVarkey, Dax T, MD;  Location:  SURGERY CENTER;  Service: Orthopedics;  Laterality: Right;   TONSILLECTOMY  as a child   TUBAL LIGATION  12/29/2000   UPPER GASTROINTESTINAL ENDOSCOPY      Prior to Admission medications   Medication Sig Start Date End Date Taking? Authorizing Provider  acetaminophen (TYLENOL) 500 MG tablet Take 1,000 mg by mouth every  6 (six) hours as needed for moderate pain.   Yes [provider]  esomeprazole (NEXIUM) 40 MG capsule Take 1 capsule (40 mg total) by mouth 2 (two) times daily before a meal. 11/10/20  Yes Lemmon, Violet BaldyJennifer Lynne, PA  famotidine (PEPCID) 20 MG tablet Take 20 mg by mouth daily. 01/22/20  Yes [provider]  Vitamin D, Ergocalciferol, (DRISDOL) 1.25 MG (50000 UNIT) CAPS capsule Take 1 capsule (50,000 Units total) by mouth every 7 (seven) days. 10/25/20  Yes Arnette FeltsMoore, Janece, FNP  albuterol (VENTOLIN HFA) 108 (90 Base) MCG/ACT inhaler Inhale 2 puffs into the lungs every 6 (six) hours as needed for wheezing or shortness of breath. 01/25/20   Charlott Holleresai, Nikita S, MD  calcium carbonate (OS-CAL - DOSED IN MG OF ELEMENTAL CALCIUM) 1250 (500 Ca) MG tablet Take 1 tablet by mouth.    [provider]  cetirizine (ZYRTEC) 10 MG tablet TAKE 1 TABLET (10 MG TOTAL) BY MOUTH DAILY AS NEEDED FOR RHINITIS. 05/16/20   Ambs, Norvel RichardsAnne M, FNP  clindamycin (CLEOCIN T) 1 % lotion Apply 1 application topically daily.    [provider]  diclofenac Sodium (VOLTAREN) 1 % GEL Apply 2 g topically 4 (four) times daily as needed (musculoskeletal pain). 10/09/20   Zigmund DanielPowell, A Caldwell Jr., MD  EPINEPHrine (EPIPEN 2-PAK) 0.3 mg/0.3 mL IJ SOAJ injection Inject 0.3 mg into the muscle as needed for anaphylaxis. 07/07/20   Ambs, Norvel RichardsAnne M, FNP  fluticasone furoate-vilanterol (BREO ELLIPTA) 100-25 MCG/INH AEPB Inhale 1 puff into the lungs daily as needed. 12/18/20   Glenford BayleyWalsh, Elizabeth W, NP  ondansetron (ZOFRAN) 4 MG tablet Take 1 tablet (4 mg total) by mouth daily as needed for nausea or vomiting. 04/19/20 04/19/21  Arnette FeltsMoore, Janece, FNP  RESTASIS 0.05 % ophthalmic emulsion Place 1 drop into both eyes 2 (two) times daily. 11/04/19   [provider]  silver sulfADIAZINE (SILVADENE) 1 % cream SMARTSIG:0.0625 Inch(es) Topical Twice Daily 09/13/20   [provider]  sucralfate (CARAFATE) 1 g tablet TAKE 1 TABLET (1 G TOTAL)  BY MOUTH EVERY 6 (SIX) HOURS. SLOWLY DISSOLVE TABLET IN 1 TABLESPOON OF DISTILLED WATER BEFORE INGESTION 04/21/20   Mycheal Veldhuizen, Willaim RayasSteven P, MD  tretinoin (RETIN-A) 0.025 % cream tretinoin 0.025 % topical cream  APPLY APPLICATIONS APPLY A PEA-SIZED AMOUNT TO FACE AT BEDTIME    [provider]    Current Outpatient Medications  Medication Sig Dispense Refill   acetaminophen (TYLENOL) 500 MG tablet Take 1,000 mg by mouth every 6 (six) hours as needed for moderate pain.     esomeprazole (NEXIUM) 40 MG capsule Take 1 capsule (40 mg total) by mouth 2 (two) times daily before a meal. 60 capsule 3   famotidine (PEPCID) 20 MG tablet Take 20 mg by mouth daily.     Vitamin D, Ergocalciferol, (DRISDOL) 1.25 MG (50000 UNIT) CAPS capsule Take 1 capsule (50,000 Units total) by mouth every 7 (seven) days. 15 capsule 3   albuterol (  VENTOLIN HFA) 108 (90 Base) MCG/ACT inhaler Inhale 2 puffs into the lungs every 6 (six) hours as needed for wheezing or shortness of breath. 1 each 11   calcium carbonate (OS-CAL - DOSED IN MG OF ELEMENTAL CALCIUM) 1250 (500 Ca) MG tablet Take 1 tablet by mouth.     cetirizine (ZYRTEC) 10 MG tablet TAKE 1 TABLET (10 MG TOTAL) BY MOUTH DAILY AS NEEDED FOR RHINITIS. 90 tablet 1   clindamycin (CLEOCIN T) 1 % lotion Apply 1 application topically daily.     diclofenac Sodium (VOLTAREN) 1 % GEL Apply 2 g topically 4 (four) times daily as needed (musculoskeletal pain). 50 g 0   EPINEPHrine (EPIPEN 2-PAK) 0.3 mg/0.3 mL IJ SOAJ injection Inject 0.3 mg into the muscle as needed for anaphylaxis. 2 each 2   fluticasone furoate-vilanterol (BREO ELLIPTA) 100-25 MCG/INH AEPB Inhale 1 puff into the lungs daily as needed. 60 each 11   ondansetron (ZOFRAN) 4 MG tablet Take 1 tablet (4 mg total) by mouth daily as needed for nausea or vomiting. 30 tablet 0   RESTASIS 0.05 % ophthalmic emulsion Place 1 drop into both eyes 2 (two) times daily.     silver sulfADIAZINE (SILVADENE) 1 % cream  SMARTSIG:0.0625 Inch(es) Topical Twice Daily     sucralfate (CARAFATE) 1 g tablet TAKE 1 TABLET (1 G TOTAL) BY MOUTH EVERY 6 (SIX) HOURS. SLOWLY DISSOLVE TABLET IN 1 TABLESPOON OF DISTILLED WATER BEFORE INGESTION 90 tablet 2   tretinoin (RETIN-A) 0.025 % cream tretinoin 0.025 % topical cream  APPLY APPLICATIONS APPLY A PEA-SIZED AMOUNT TO FACE AT BEDTIME     Current Facility-Administered Medications  Medication Dose Route Frequency Provider Last Rate Last Admin   0.9 %  sodium chloride infusion  500 mL Intravenous Once Nabil Bubolz, Willaim Rayas, MD        Allergies as of 12/19/2020 - Review Complete 12/19/2020  Allergen Reaction Noted   Aspirin  02/14/2020   Ibuprofen  02/14/2020   Salmon [fish allergy]  12/06/2019   Zoledronic acid Other (See Comments) 01/19/2020    Family History  Problem Relation Age of Onset   Diabetes Mother    Hypertension Mother    Hyperlipidemia Mother    Sleep apnea Mother    Thyroid disease Sister    Dementia Father    Heart attack Maternal Grandmother    Stroke Maternal Grandfather    Asthma Son    Healthy Son    Asthma Daughter    Healthy Daughter    Colon cancer Neg Hx    Colon polyps Neg Hx    Esophageal cancer Neg Hx    Rectal cancer Neg Hx    Stomach cancer Neg Hx     Social History   Socioeconomic History   Marital status: Divorced    Spouse name: Not on file   Number of children: 2   Years of education: 12th   Highest education level: Not on file  Occupational History   Occupation: Facilities manager: OTHER    Comment: [employer]  Tobacco Use   Smoking status: Never   Smokeless tobacco: Never  Vaping Use   Vaping Use: Never used  Substance and Sexual Activity   Alcohol use: Yes    Comment: occasional    Drug use: No   Sexual activity: Not on file  Other Topics Concern   Not on file  Social History Narrative   Pt lives in 2 story town home with her son  Has 2 children   High school graduate   Bus driver for [employer]   Right handed   Social Determinants of Health   Financial Resource Strain: Not on file  Food Insecurity: Not on file  Transportation Needs: Not on file  Physical Activity: Not on file  Stress: Not on file  Social Connections: Not on file  Intimate Partner Violence: Not on file    Review of Systems:  All other review of systems negative except as mentioned in the HPI.  Physical Exam: Vital signs BP 131/86   Pulse 64   Temp (!) 97.5 F (36.4 C)   Resp 14   Ht 5' 3.75" (1.619 m)   Wt 210 lb (95.3 kg)   LMP 12/27/2003   SpO2 100%   BMI 36.33 kg/m   General:   Alert,  Well-developed, well-nourished, pleasant and cooperative in NAD Lungs:  Clear throughout to auscultation.   Heart:  Regular rate and rhythm;  Abdomen:  Soft, nontender and nondistended. Normal bowel sounds.   Neuro/Psych:  Alert and cooperative. Normal mood and affect. A and O x 3  Harlin Rain, MD [facility]

## 2020-12-19 NOTE — Progress Notes (Signed)
Called to room to assist during endoscopic procedure.  Patient ID and intended procedure confirmed with present staff. Received instructions for my participation in the procedure from the performing physician.  

## 2020-12-19 NOTE — Progress Notes (Signed)
Report given to PACU, vss 

## 2020-12-19 NOTE — Progress Notes (Signed)
VS taken by C.W. 

## 2020-12-19 NOTE — Assessment & Plan Note (Signed)
-   Stable interval; No acute respiratory symptoms or recent exacerbations. Rare SABA use. Continue Breo one puff daily and prn Albuterol hfa. Refills provided. FU in 1 year or sooner if needed.

## 2020-12-19 NOTE — Op Note (Signed)
[facility] Patient Name: [name] Procedure Date: 12/19/2020 8:34 AM MRN: [id number] Endoscopist: Viviann Spare P. Adela Lank , MD Age: 45 Referring MD:  Date of Birth: November 14, 1975 Gender: Female Account #: [id number] Procedure:                Upper GI endoscopy Indications:              Epigastric abdominal pain - post prandial, Nausea -                            on nexium twice daily and carafate, symptoms                            persist, post cholecystectomy. History of                            spontaneous bladder perforation in recent months.                            CT imaging on 6/28 without any new or other                            findings to explain symptoms Medicines:                Monitored Anesthesia Care Procedure:                Pre-Anesthesia Assessment:                           - Prior to the procedure, a History and Physical                            was performed, and patient medications and                            allergies were reviewed. The patient's tolerance of                            previous anesthesia was also reviewed. The risks                            and benefits of the procedure and the sedation                            options and risks were discussed with the patient.                            All questions were answered, and informed consent                            was obtained. Prior Anticoagulants: The patient has                            taken no previous anticoagulant or antiplatelet  agents. ASA Grade Assessment: II - A patient with                            mild systemic disease. After reviewing the risks                            and benefits, the patient was deemed in                            satisfactory condition to undergo the procedure.                           After obtaining informed consent, the endoscope was                            passed under direct vision.  Throughout the                            procedure, the patient's blood pressure, pulse, and                            oxygen saturations were monitored continuously. The                            GIF [id number] #[id number] was introduced through the                            mouth, and advanced to the second part of duodenum.                            The upper GI endoscopy was accomplished without                            difficulty. The patient tolerated the procedure                            well. Scope In: Scope Out: Findings:                 Esophagogastric landmarks were identified: the                            Z-line was found at 38 cm, the gastroesophageal                            junction was found at 38 cm and the upper extent of                            the gastric folds was found at 40 cm from the                            incisors.                           A  2 cm hiatal hernia was present.                           The exam of the esophagus was otherwise normal.                           Patchy erythematous mucosa was found in the gastric                            body without focal ulceration.                           The exam of the stomach was otherwise normal.                           Biopsies were taken with a cold forceps in the                            gastric body, at the incisura and in the gastric                            antrum for Helicobacter pylori testing.                           The duodenal bulb and second portion of the                            duodenum were normal. Complications:            No immediate complications. Estimated blood loss:                            Minimal. Estimated Blood Loss:     Estimated blood loss was minimal. Impression:               - Esophagogastric landmarks identified.                           - 2 cm hiatal hernia.                           - Normal esophagus otherwise                           - Patchy  mild erythematous mucosa in the gastric                            body.                           - Normal stomach otherwise - biopsies taken to rule                            out H pylori                           - Normal  duodenal bulb and second portion of the                            duodenum. Recommendation:           - Patient has a contact number available for                            emergencies. The signs and symptoms of potential                            delayed complications were discussed with the                            patient. Return to normal activities tomorrow.                            Written discharge instructions were provided to the                            patient.                           - Resume previous diet.                           - Continue present medications.                           - Avoid NSAIDs                           - Await pathology results.                           - Trial of FD gard to treat possible component of                            dyspepsia, samples available from our office Minnetta Sandora P. Dio Giller, MD 12/19/2020 8:52:45 AM This report has been signed electronically.

## 2020-12-19 NOTE — Patient Instructions (Signed)
YOU HAD AN ENDOSCOPIC PROCEDURE TODAY AT THE [facility]:   Refer to the procedure report that was given to you for any specific questions about what was found during the examination.  If the procedure report does not answer your questions, please call your gastroenterologist to clarify.  If you requested that your care partner not be given the details of your procedure findings, then the procedure report has been included in a sealed envelope for you to review at your convenience later.  YOU SHOULD EXPECT: Some feelings of bloating in the abdomen. Passage of more gas than usual.  Walking can help get rid of the air that was put into your GI tract during the procedure and reduce the bloating. If you had a lower endoscopy (such as a colonoscopy or flexible sigmoidoscopy) you may notice spotting of blood in your stool or on the toilet paper. If you underwent a bowel prep for your procedure, you may not have a normal bowel movement for a few days.  Please Note:  You might notice some irritation and congestion in your nose or some drainage.  This is from the oxygen used during your procedure.  There is no need for concern and it should clear up in a day or so.  SYMPTOMS TO REPORT IMMEDIATELY:     Following upper endoscopy (EGD)  Vomiting of blood or coffee ground material  New chest pain or pain under the shoulder blades  Painful or persistently difficult swallowing  New shortness of breath  Fever of 100F or higher  Black, tarry-looking stools  For urgent or emergent issues, a gastroenterologist can be reached at any hour by calling [phone]. Do not use MyChart messaging for urgent concerns.    DIET:  We do recommend a small meal at first, but then you may proceed to your regular diet.  Drink plenty of fluids but you should avoid alcoholic beverages for 24 hours.  ACTIVITY:  You should plan to take it easy for the rest of today and you should NOT DRIVE or use heavy machinery  until tomorrow (because of the sedation medicines used during the test).    FOLLOW UP: Our staff will call the number listed on your records 48-72 hours following your procedure to check on you and address any questions or concerns that you may have regarding the information given to you following your procedure. If we do not reach you, we will leave a message.  We will attempt to reach you two times.  During this call, we will ask if you have developed any symptoms of COVID 19. If you develop any symptoms (ie: fever, flu-like symptoms, shortness of breath, cough etc.) before then, please call [phone].  If you test positive for Covid 19 in the 2 weeks post procedure, please call and report this information to [place].    If any biopsies were taken you will be contacted by phone or by letter within the next 1-3 weeks.  Please call us at [phone] if you have not heard about the biopsies in 3 weeks.    SIGNATURES/CONFIDENTIALITY: You and/or your care partner have signed paperwork which will be entered into your electronic medical record.  These signatures attest to the fact that that the information above on your After Visit Summary has been reviewed and is understood.  Full responsibility of the confidentiality of this discharge information lies with you and/or your care-partner.      Avoid   NSAIDS ,Resume  remainder  of medications. Information given on Hiatal Hernia,FD gard samples brought up by office staff.

## 2020-12-19 NOTE — Progress Notes (Signed)
834 Robinul 0.1 mg IV given due large amount of secretions upon assessment.  MD made aware, vss

## 2020-12-20 NOTE — Chronic Care Management (AMB) (Signed)
  Care Management   Note  12/20/2020 Name: [name] MRN: [id number] DOB: 07/16/1975  [name] is a 45 y.o. year old female who is a primary care patient of [name], FNP and is actively engaged with the care management team. I reached out to [employer] by phone today to assist with re-scheduling an initial visit with the BSW  Follow up plan: Telephone appointment with care management team member scheduled for:12/27/20 with BSW and 01/16/21 RNCM  [facility] Guide, Embedded Care Coordination [facility]  Care Management  Direct Dial: [phone]

## 2020-12-21 ENCOUNTER — Telehealth: Payer: Self-pay

## 2020-12-21 NOTE — Telephone Encounter (Signed)
Left message on answering machine. 

## 2020-12-25 ENCOUNTER — Telehealth: Payer: 59

## 2020-12-27 ENCOUNTER — Ambulatory Visit: Payer: 59

## 2020-12-27 DIAGNOSIS — J4540 Moderate persistent asthma, uncomplicated: Secondary | ICD-10-CM

## 2020-12-27 DIAGNOSIS — E785 Hyperlipidemia, unspecified: Secondary | ICD-10-CM

## 2020-12-27 NOTE — Patient Instructions (Signed)
Visit Information  Ms. Kurtz was given information about Care Management services by the embedded care coordination team including:  Care Management services include personalized support from designated clinical staff supervised by her physician, including individualized plan of care and coordination with other care providers 24/7 contact phone numbers for assistance for urgent and routine care needs. The patient may stop CCM services at any time (effective at the end of the month) by phone call to the office staff.  Patient agreed to services and verbal consent obtained.   Patient verbalizes understanding of instructions provided today and agrees to view in MyChart.   No SW follow up planned at this time. Please contact me as needed.  Bevelyn Ngo, BSW, CDP Social Worker, Certified Dementia Practitioner [facility] Care Management [phone]

## 2020-12-27 NOTE — Chronic Care Management (AMB) (Signed)
Social Work Note  12/27/2020 Name: [name] MRN: [id number] DOB: 1975-06-24  [name] is a 45 y.o. year old female who is a primary care patient of [name], FNP.  The Care Management team was consulted for assistance with chronic disease management and care coordination needs.  Ms. Carrell was given information about Care Management services today including:  Care Management services include personalized support from designated clinical staff supervised by her physician, including individualized plan of care and coordination with other care providers 24/7 contact phone numbers for assistance for urgent and routine care needs. The patient may stop care management services at any time (effective at the end of the month) by phone call to the office staff.  Patient agreed to services and consent obtained.   Engaged with patient by telephone for initial visit in response to provider referral for social work chronic care management and care coordination services.  Assessment: Review of patient past medical history, allergies, medications, and health status, including review of pertinent consultant reports was performed as part of comprehensive evaluation and provision of care management/care coordination services.   SDOH (Social Determinants of Health) assessments and interventions performed:  Yes SDOH Interventions    Flowsheet Row Most Recent Value  SDOH Interventions   Food Insecurity Interventions Intervention Not Indicated  Housing Interventions Intervention Not Indicated  Transportation Interventions Intervention Not Indicated        Advanced Directives Status:  Patient does not have an advance directive and declines to complete on at this time.  Care Plan  Allergies  Allergen Reactions   Aspirin    Ibuprofen    Salmon [Fish Allergy]    Zoledronic Acid Other (See Comments)    Outpatient Encounter Medications as of 12/27/2020  Medication Sig Note    acetaminophen (TYLENOL) 500 MG tablet Take 1,000 mg by mouth every 6 (six) hours as needed for moderate pain.    albuterol (VENTOLIN HFA) 108 (90 Base) MCG/ACT inhaler Inhale 2 puffs into the lungs every 6 (six) hours as needed for wheezing or shortness of breath.    calcium carbonate (OS-CAL - DOSED IN MG OF ELEMENTAL CALCIUM) 1250 (500 Ca) MG tablet Take 1 tablet by mouth.    cetirizine (ZYRTEC) 10 MG tablet TAKE 1 TABLET (10 MG TOTAL) BY MOUTH DAILY AS NEEDED FOR RHINITIS.    clindamycin (CLEOCIN T) 1 % lotion Apply 1 application topically daily.    diclofenac Sodium (VOLTAREN) 1 % GEL Apply 2 g topically 4 (four) times daily as needed (musculoskeletal pain).    EPINEPHrine (EPIPEN 2-PAK) 0.3 mg/0.3 mL IJ SOAJ injection Inject 0.3 mg into the muscle as needed for anaphylaxis. 11/10/2020: On hand   esomeprazole (NEXIUM) 40 MG capsule Take 1 capsule (40 mg total) by mouth 2 (two) times daily before a meal.    famotidine (PEPCID) 20 MG tablet Take 20 mg by mouth daily.    fluticasone furoate-vilanterol (BREO ELLIPTA) 100-25 MCG/INH AEPB Inhale 1 puff into the lungs daily as needed.    ondansetron (ZOFRAN) 4 MG tablet Take 1 tablet (4 mg total) by mouth daily as needed for nausea or vomiting.    RESTASIS 0.05 % ophthalmic emulsion Place 1 drop into both eyes 2 (two) times daily.    silver sulfADIAZINE (SILVADENE) 1 % cream SMARTSIG:0.0625 Inch(es) Topical Twice Daily    sucralfate (CARAFATE) 1 g tablet TAKE 1 TABLET (1 G TOTAL) BY MOUTH EVERY 6 (SIX) HOURS. SLOWLY DISSOLVE TABLET IN 1 TABLESPOON OF DISTILLED WATER  BEFORE INGESTION    tretinoin (RETIN-A) 0.025 % cream tretinoin 0.025 % topical cream  APPLY APPLICATIONS APPLY A PEA-SIZED AMOUNT TO FACE AT BEDTIME    Vitamin D, Ergocalciferol, (DRISDOL) 1.25 MG (50000 UNIT) CAPS capsule Take 1 capsule (50,000 Units total) by mouth every 7 (seven) days.    No facility-administered encounter medications on file as of 12/27/2020.    Patient Active Problem  List   Diagnosis Date Noted   AKI (acute kidney injury) (HCC) 10/06/2020   Ascites 10/05/2020   Other insomnia 07/27/2020   At risk for side effect of medication 07/27/2020   Other hyperlipidemia 05/15/2020   At risk for osteoporosis 05/15/2020   At risk for impaired metabolic function 04/04/2020   Other fatigue 03/22/2020   SOBOE (shortness of breath on exertion) 03/22/2020   Exertional dyspnea 03/14/2020   Neuropathy, right side of face 03/08/2020   At risk for depression 03/08/2020   Primary osteoarthritis of both feet 02/26/2020   Vitamin D deficiency, unspecified 01/26/2020   Hyperlipidemia 01/26/2020   Prediabetes 01/26/2020   At risk for heart disease 01/26/2020   Daytime somnolence 01/26/2020   Vitamin D deficiency 01/04/2020   Fatty liver 01/04/2020   B12 deficiency 01/04/2020   Moderate persistent asthma without complication 12/31/2019   Inappropriate sinus tachycardia 12/08/2019   Class 3 severe obesity with serious comorbidity and body mass index (BMI) of 40.0 to 44.9 in adult (HCC) 12/08/2019   Apnea 12/08/2019   Anaphylactic reaction due to food, subsequent encounter 11/22/2019   Idiopathic urticaria 11/22/2019   Generalized anxiety disorder 04/08/2018   Atypical chest pain    Regurgitation of food    Laryngopharyngeal reflux (LPR) 07/30/2017   Throat soreness 07/30/2017   ANA positive 12/30/2016   Gastroesophageal reflux disease 06/27/2016   Disturbance of skin sensation 04/27/2014   Dizziness and giddiness 03/14/2014   Headache, migraine 03/14/2014   Myalgia and myositis, unspecified 06/07/2013   Nausea and vomiting in adult 08/22/2012   Viral syndrome 08/22/2012   Pelvic pain in female 12/18/2010   PID (acute pelvic inflammatory disease) 12/18/2010   ANEMIA-IRON DEFICIENCY 10/15/2006   HERPES, GENITAL NOS 09/30/2006   Obesity (BMI 35.0-39.9 without comorbidity) 09/30/2006   DISORDER, NONORGANIC SLEEP NOS 09/30/2006   CONSTIPATION NOS 09/30/2006    NEURITIS, LUMBOSACRAL NOS 09/30/2006   HX, PERSONAL, MENTAL DISORDER NOS 09/30/2006    Conditions to be addressed/monitored:  Hyperlipidemia and Moderate Persistent Asthma without Complication  Follow Up Plan:  No follow up planned at this time as the patient denies any care coordination needs. The patient will be contacted by RN Care Manager for a clinical assessment.      Bevelyn Ngo, BSW, CDP Social Worker, Certified Dementia Practitioner [facility] Care Management [phone]

## 2021-01-10 DIAGNOSIS — F32A Depression, unspecified: Secondary | ICD-10-CM | POA: Insufficient documentation

## 2021-01-10 DIAGNOSIS — E041 Nontoxic single thyroid nodule: Secondary | ICD-10-CM | POA: Insufficient documentation

## 2021-01-10 DIAGNOSIS — M810 Age-related osteoporosis without current pathological fracture: Secondary | ICD-10-CM | POA: Insufficient documentation

## 2021-01-16 ENCOUNTER — Telehealth: Payer: 59

## 2021-01-16 ENCOUNTER — Ambulatory Visit: Payer: Self-pay

## 2021-01-16 DIAGNOSIS — R1319 Other dysphagia: Secondary | ICD-10-CM

## 2021-01-16 DIAGNOSIS — R768 Other specified abnormal immunological findings in serum: Secondary | ICD-10-CM

## 2021-01-16 DIAGNOSIS — E785 Hyperlipidemia, unspecified: Secondary | ICD-10-CM

## 2021-01-16 DIAGNOSIS — J4540 Moderate persistent asthma, uncomplicated: Secondary | ICD-10-CM

## 2021-01-16 DIAGNOSIS — E559 Vitamin D deficiency, unspecified: Secondary | ICD-10-CM

## 2021-01-16 DIAGNOSIS — R188 Other ascites: Secondary | ICD-10-CM

## 2021-01-16 NOTE — Chronic Care Management (AMB) (Signed)
  Care Management   Follow Up Note   01/16/2021 Name: [name] MRN: [id number] DOB: [birth year]   Referred by: [name], FNP Reason for referral : Chronic Care Management (Initial RN CM Outreach - 3rd attempt )   Third unsuccessful telephone outreach was attempted today. The patient was referred to the case management team for assistance with care management and care coordination. The patient's primary care provider has been notified of our unsuccessful attempts to make or maintain contact with the patient. The care management team is pleased to engage with this patient at any time in the future should he/she be interested in assistance from the care management team.   Follow Up Plan: We have been unable to make contact with the patient for follow up. The care management team is available to follow up with the patient after provider conversation with the patient regarding recommendation for care management engagement and subsequent re-referral to the care management team.   Delsa Sale, RN, BSN, CCM Care Management Coordinator [facility] Care Management/[facility]  Direct Phone: [phone]

## 2021-02-04 ENCOUNTER — Other Ambulatory Visit: Payer: Self-pay | Admitting: Internal Medicine

## 2021-02-04 DIAGNOSIS — J4540 Moderate persistent asthma, uncomplicated: Secondary | ICD-10-CM

## 2021-03-20 NOTE — Progress Notes (Signed)
FOLLOW UP Date of Service/Encounter:  03/21/21   Subjective:  [name] (DOB: 06-12-1975) is a 45 y.o. female PMHx of Eagle syndrome, obesity, vit D deficiency, LPR, anxiety, GERD, hyperlipidemia, fatty liver who [facility] in re-evaluation of the following: new concern for food allergy to fish and peanut butter History obtained from: chart review and patient.  For Review, LV was on 07/07/20  with Thermon Leyland, FNP seen for  asthma, allergic rhinitis (flonase, saline rinses), allergic conjunctivitis (Pataday), urticaria (zyrtec 10-40/day as needed), reflux (esomeprazole 40 mg-managed by GI), and food allergy to salmon.   Asthma managed by Pulmonary (on Breo Elipta 1 puff daily).  Previous diagnostics:  - allergy testing + tree pollen, dust mite, and cockroach  Tuna and peanut butter have recently started causing her to feel her throat is swelling, itchy all over, nauseous.  Takes Benadryl to help with symptoms.  She is scared to use epinephrine autoinjector.  Symptoms occur within 30 minutes.  With PB, symptoms are similar, but usually occurs when she eats a higher quantity.   Almond milk-similar, had hives around face, itching, nausea, diarrhea.  Avoids salmon. She eats shellfish without symptoms.  Says she doesn't get hives unless eats something she is allergic to it.    Allergies as of 03/21/2021       Reactions   Aspirin    Ibuprofen    Salmon [fish Allergy]    Zoledronic Acid Other (See Comments)        Medication List        Accurate as of March 21, 2021  4:29 PM. If you have any questions, ask your nurse or doctor.          acetaminophen 500 MG tablet Commonly known as: TYLENOL Take 1,000 mg by mouth every 6 (six) hours as needed for moderate pain.   albuterol 108 (90 Base) MCG/ACT inhaler Commonly known as: VENTOLIN HFA TAKE 2 PUFFS BY MOUTH EVERY 6 HOURS AS NEEDED FOR WHEEZE OR SHORTNESS OF BREATH    calcium carbonate 1250 (500 Ca) MG tablet Commonly known as: OS-CAL - dosed in mg of elemental calcium Take 1 tablet by mouth.   cetirizine 10 MG tablet Commonly known as: ZYRTEC Take 1 tablet (10 mg total) by mouth daily as needed for rhinitis.   clindamycin 1 % lotion Commonly known as: CLEOCIN T Apply 1 application topically daily.   diclofenac Sodium 1 % Gel Commonly known as: VOLTAREN Apply 2 g topically 4 (four) times daily as needed (musculoskeletal pain).   EPINEPHrine 0.3 mg/0.3 mL Soaj injection Commonly known as: EpiPen 2-Pak Inject 0.3 mg into the muscle as needed for anaphylaxis.   esomeprazole 40 MG capsule Commonly known as: NEXIUM Take 1 capsule (40 mg total) by mouth 2 (two) times daily before a meal.   famotidine 20 MG tablet Commonly known as: PEPCID Take 20 mg by mouth daily.   fluticasone furoate-vilanterol 100-25 MCG/INH Aepb Commonly known as: Breo Ellipta Inhale 1 puff into the lungs daily as needed.   ondansetron 4 MG tablet Commonly known as: Zofran Take 1 tablet (4 mg total) by mouth daily as needed for nausea or vomiting.   Restasis 0.05 % ophthalmic emulsion Generic drug: cycloSPORINE Place 1 drop into both eyes 2 (two) times daily.   silver sulfADIAZINE 1 % cream Commonly known as: SILVADENE SMARTSIG:0.0625 Inch(es) Topical Twice Daily   sucralfate 1 g tablet Commonly known as: CARAFATE TAKE 1 TABLET (  1 G TOTAL) BY MOUTH EVERY 6 (SIX) HOURS. SLOWLY DISSOLVE TABLET IN 1 TABLESPOON OF DISTILLED WATER BEFORE INGESTION   tretinoin 0.025 % cream Commonly known as: RETIN-A tretinoin 0.025 % topical cream  APPLY APPLICATIONS APPLY A PEA-SIZED AMOUNT TO FACE AT BEDTIME   Vitamin D (Ergocalciferol) 1.25 MG (50000 UNIT) Caps capsule Commonly known as: DRISDOL Take 1 capsule (50,000 Units total) by mouth every 7 (seven) days.       Past Medical History:  Diagnosis Date   Allergy    Anemia    Angio-edema    Anxiety    Apnea  12/08/2019   Arthritis    Asthma    Back pain    Constipation    Exertional dyspnea 03/14/2020   Fatty liver    GERD (gastroesophageal reflux disease)    H/O: hysterectomy    Hx of endometriosis    Inappropriate sinus tachycardia 12/08/2019   Insomnia    Joint pain    Lactose intolerance    Macromastia 07/2011   Migraine    Morbid obesity (HCC) 12/08/2019   Osteoporosis    Urticaria    Vitamin D deficiency    Past Surgical History:  Procedure Laterality Date   65 HOUR PH STUDY N/A 01/12/2018   Procedure: 24 HOUR PH STUDY;  Surgeon: Napoleon Form, MD;  Location: [facility];  Service: Endoscopy;  Laterality: N/A;   ABDOMINAL HYSTERECTOMY  12/12/2003   ADENOIDECTOMY  05/2011   ANKLE ARTHROSCOPY  12/29/2007   right; with extensive debridement   BLADDER NECK RECONSTRUCTION  01/14/2011   Procedure: BLADDER NECK REPAIR;  Surgeon: Reva Bores, MD;  Location: [facility];  Service: Gynecology;  Laterality: N/A;  Laparoscopic Repair of Incidental Cystotomy   BREAST REDUCTION SURGERY  08/05/2011   Procedure: MAMMARY REDUCTION  (BREAST);  Surgeon: Louisa Second, MD;  Location: [facility];  Service: Plastics;  Laterality: Bilateral;  bilateral   BREAST SURGERY  2012   breast reduction   CESAREAN SECTION  12/29/2000; 1994   DILATION AND CURETTAGE OF UTERUS  07/08/2003   open laparoscopy   ESOPHAGEAL MANOMETRY N/A 01/12/2018   Procedure: ESOPHAGEAL MANOMETRY (EM);  Surgeon: Napoleon Form, MD;  Location: [facility];  Service: Endoscopy;  Laterality: N/A;   GIVENS CAPSULE STUDY N/A 06/23/2017   Procedure: GIVENS CAPSULE STUDY;  Surgeon: Benancio Deeds, MD;  Location: [facility];  Service: Gastroenterology;  Laterality: N/A;   LAPAROSCOPIC SALPINGOOPHERECTOMY  01/14/2011   left   PH IMPEDANCE STUDY N/A 01/12/2018   Procedure: PH IMPEDANCE STUDY;  Surgeon: Napoleon Form, MD;  Location: [facility];  Service: Endoscopy;  Laterality: N/A;   REPAIR PERONEAL TENDONS ANKLE   01/03/2010   repair right subluxing peroneal tendons   RESECTION DISTAL CLAVICAL Right 09/25/2017   Procedure: RESECTION DISTAL CLAVICAL;  Surgeon: Bjorn Pippin, MD;  Location: [facility];  Service: Orthopedics;  Laterality: Right;   RIGHT OOPHORECTOMY     with lysis of adhesions   SHOULDER ACROMIOPLASTY Right 09/25/2017   Procedure: SHOULDER ACROMIOPLASTY;  Surgeon: Bjorn Pippin, MD;  Location: [facility];  Service: Orthopedics;  Laterality: Right;   SHOULDER ARTHROSCOPY WITH DEBRIDEMENT AND BICEP TENDON REPAIR Right 09/25/2017   Procedure: SHOULDER ARTHROSCOPY WITH DEBRIDEMENT AND BICEP TENDON REPAIR;  Surgeon: Bjorn Pippin, MD;  Location: [facility];  Service: Orthopedics;  Laterality: Right;   SHOULDER ARTHROSCOPY WITH ROTATOR CUFF REPAIR Right 09/25/2017   Procedure: SHOULDER ARTHROSCOPY WITH  ROTATOR CUFF REPAIR;  Surgeon: Bjorn Pippin, MD;  Location: [facility];  Service: Orthopedics;  Laterality: Right;   TONSILLECTOMY  as a child   TUBAL LIGATION  12/29/2000   UPPER GASTROINTESTINAL ENDOSCOPY     Otherwise, there have been no changes to her past medical history, surgical history, family history, or social history.  ROS: All others negative except as noted per HPI.   Objective:  Pulse (!) 106   Temp (!) 97.2 F (36.2 C)   Resp 18   Ht 5\' 3"  (1.6 m)   Wt 214 lb (97.1 kg)   LMP 12/27/2003   SpO2 100%   BMI 37.91 kg/m  Body mass index is 37.91 kg/m. Physical Exam: General Appearance:  Alert, cooperative, no distress, appears stated age  Head:  Normocephalic, without obvious abnormality, atraumatic  Eyes:  Conjunctiva clear, EOM's intact  Nose: Nares normal, normal turbinates,   Throat: Lips, tongue normal; teeth and gums normal, normal posterior oropharynx  Neck: Supple, symmetrical  Lungs:   Respirations unlabored, no coughing, CTAB  Heart:  Appears well perfused, RRR , no murmur,   Extremities: No edema  Skin:  Skin color, texture, turgor normal, no rashes or lesions on visualized portions of skin  Neurologic: No gross deficits   Spirometry:  Tracings reviewed. Her effort: Good reproducible efforts. FVC: 2.89L FEV1: 2.59L, 110% predicted FEV1/FVC ratio: 106% Interpretation: Spirometry consistent with normal pattern.  Please see scanned spirometry results for details.  Assessment/Plan  Unclear if new reaction to finned fish and nuts is new food allergy vs other GI symptoms (she has history of nausea/vomiting, sees GI) + has history of chronic hives thought she states it only happens with these foods although amount seems to play a role making true food allergy less likely.  Will obtain serum testing today for these foods (nuts and fish) as well as rule out mast cell activation syndrome or disorders with screening tryptase.   Patient Instructions  Allergic reaction Continue to avoid salmon, tuna and nuts. In case of an allergic reaction, take Benadryl 50 mg every 4 hours, and if life-threatening symptoms occur, inject with EpiPen 0.3 mg Blood test today to salmon, tuna, and peanuts and treenuts as well as tryptase.  We will contact you with results Emergency action plan provided.  Allergic rhinitis-controlled Continue allergen avoidance measures directed toward tree pollen, dust mite, and cockroach as listed below, continue an antihistamine once a day as needed for runny nose or itch Continue Flonase 1 to 2 sprays in each nostril once a day as needed for stuffy nose.  In the right nostril, point the applicator out toward the right ear. In the left nostril, point the applicator out toward the left ear Consider saline nasal rinses as needed for nasal symptoms. Use this before any medicated nasal sprays for best result Begin nasal saline gel as needed for dry nostrils  Allergic conjunctivitis Some over the counter eye drops include Pataday one drop in each eye once a day as needed for red, itchy eyes  OR Zaditor one drop in each eye twice a day as needed for red itchy eyes.  Hives (urticaria)  Cetirizine (Zyrtec) 10mg  twice a day and famotidine (Pepcid) 20 mg twice a day. If no symptoms for 7-14 days then decrease to. Cetirizine (Zyrtec) 10mg  twice a day and famotidine (Pepcid) 20 mg once a day.  If no symptoms for 7-14 days then decrease to. Cetirizine (Zyrtec) 10mg  twice a day.  If no  symptoms for 7-14 days then decrease to. Cetirizine (Zyrtec) 10mg  once a day.  May use Benadryl (diphenhydramine) as needed for breakthrough hives       If symptoms return, then step up dosage  Keep a detailed symptom journal including foods eaten, contact with allergens, medications taken, weather changes.   Asthma-controlled Lung testing looked great today. Continue your current treatment plan as outlined by [facility] and keep follow up appointments as directed Continue Breo Ellipta 1 puff once a day to prevent cough or wheeze Continue albuterol 2 puffs once every 4 hours as needed for cough or wheeze  Reflux Continue esomeprazole 40 mg once a day as previously prescribed by your GI specialist Continue dietary and lifestyle modifications as listed below  Call the clinic if this treatment plan is not working well for you  Follow up in 6 months or sooner if needed.  [facility], MD  [facility]

## 2021-03-21 ENCOUNTER — Other Ambulatory Visit: Payer: Self-pay

## 2021-03-21 ENCOUNTER — Encounter: Payer: Self-pay | Admitting: Internal Medicine

## 2021-03-21 ENCOUNTER — Ambulatory Visit (INDEPENDENT_AMBULATORY_CARE_PROVIDER_SITE_OTHER): Payer: 59 | Admitting: Internal Medicine

## 2021-03-21 VITALS — BP 126/80 | HR 106 | Temp 97.2°F | Resp 18 | Ht 63.0 in | Wt 214.0 lb

## 2021-03-21 DIAGNOSIS — J4540 Moderate persistent asthma, uncomplicated: Secondary | ICD-10-CM | POA: Diagnosis not present

## 2021-03-21 DIAGNOSIS — J309 Allergic rhinitis, unspecified: Secondary | ICD-10-CM | POA: Diagnosis not present

## 2021-03-21 DIAGNOSIS — K219 Gastro-esophageal reflux disease without esophagitis: Secondary | ICD-10-CM | POA: Diagnosis not present

## 2021-03-21 DIAGNOSIS — T781XXD Other adverse food reactions, not elsewhere classified, subsequent encounter: Secondary | ICD-10-CM

## 2021-03-21 DIAGNOSIS — H1013 Acute atopic conjunctivitis, bilateral: Secondary | ICD-10-CM

## 2021-03-21 DIAGNOSIS — T781XXA Other adverse food reactions, not elsewhere classified, initial encounter: Secondary | ICD-10-CM

## 2021-03-21 MED ORDER — EPINEPHRINE 0.3 MG/0.3ML IJ SOAJ
0.3000 mg | INTRAMUSCULAR | 2 refills | Status: AC | PRN
Start: 2021-03-21 — End: ?

## 2021-03-21 MED ORDER — CETIRIZINE HCL 10 MG PO TABS: 10.0000 mg | ORAL_TABLET | Freq: Every day | ORAL | 1 refills | Status: DC | PRN

## 2021-03-21 NOTE — Patient Instructions (Signed)
Allergic reaction Continue to avoid salmon, tuna and nuts. In case of an allergic reaction, take Benadryl 50 mg every 4 hours, and if life-threatening symptoms occur, inject with EpiPen 0.3 mg Blood test today to salmon, tuna, and peanuts and treenuts as well as tryptase.  We will contact you with results Emergency action plan provided.  Allergic rhinitis-controlled Continue allergen avoidance measures directed toward tree pollen, dust mite, and cockroach as listed below, continue an antihistamine once a day as needed for runny nose or itch Continue Flonase 1 to 2 sprays in each nostril once a day as needed for stuffy nose.  In the right nostril, point the applicator out toward the right ear. In the left nostril, point the applicator out toward the left ear Consider saline nasal rinses as needed for nasal symptoms. Use this before any medicated nasal sprays for best result Begin nasal saline gel as needed for dry nostrils  Allergic conjunctivitis Some over the counter eye drops include Pataday one drop in each eye once a day as needed for red, itchy eyes OR Zaditor one drop in each eye twice a day as needed for red itchy eyes.  Hives (urticaria)  Cetirizine (Zyrtec) 10mg  twice a day and famotidine (Pepcid) 20 mg twice a day. If no symptoms for 7-14 days then decrease to. Cetirizine (Zyrtec) 10mg  twice a day and famotidine (Pepcid) 20 mg once a day.  If no symptoms for 7-14 days then decrease to. Cetirizine (Zyrtec) 10mg  twice a day.  If no symptoms for 7-14 days then decrease to. Cetirizine (Zyrtec) 10mg  once a day.  May use Benadryl (diphenhydramine) as needed for breakthrough hives       If symptoms return, then step up dosage  Keep a detailed symptom journal including foods eaten, contact with allergens, medications taken, weather changes.   Asthma-controlled Lung testing looked great today. Continue your current treatment plan as outlined by [facility] and keep follow up  appointments as directed Continue Breo Ellipta 1 puff once a day to prevent cough or wheeze Continue albuterol 2 puffs once every 4 hours as needed for cough or wheeze  Reflux Continue esomeprazole 40 mg once a day as previously prescribed by your GI specialist Continue dietary and lifestyle modifications as listed below  Call the clinic if this treatment plan is not working well for you  Follow up in 6 months or sooner if needed.  Reducing Pollen Exposure The American Academy of Allergy, Asthma and Immunology suggests the following steps to reduce your exposure to pollen during allergy seasons. Do not hang sheets or clothing out to dry; pollen may collect on these items. Do not mow lawns or spend time around freshly cut grass; mowing stirs up pollen. Keep windows closed at night.  Keep car windows closed while driving. Minimize morning activities outdoors, a time when pollen counts are usually at their highest. Stay indoors as much as possible when pollen counts or humidity is high and on windy days when pollen tends to remain in the air longer. Use air conditioning when possible.  Many air conditioners have filters that trap the pollen spores. Use a HEPA room air filter to remove pollen form the indoor air you breathe.   Control of Dust Mite Allergen Dust mites play a major role in allergic asthma and rhinitis. They occur in environments with high humidity wherever human skin is found. Dust mites absorb humidity from the atmosphere (ie, they do not drink) and feed on organic matter (including shed  human and animal skin). Dust mites are a microscopic type of insect that you cannot see with the naked eye. High levels of dust mites have been detected from mattresses, pillows, carpets, upholstered furniture, bed covers, clothes, soft toys and any woven material. The principal allergen of the dust mite is found in its feces. A gram of dust may contain 1,000 mites and 250,000 fecal particles.  Mite antigen is easily measured in the air during house cleaning activities. Dust mites do not bite and do not cause harm to humans, other than by triggering allergies/asthma.  Ways to decrease your exposure to dust mites in your home:  1. Encase mattresses, box springs and pillows with a mite-impermeable barrier or cover  2. Wash sheets, blankets and drapes weekly in hot water (130 F) with detergent and dry them in a dryer on the hot setting.  3. Have the room cleaned frequently with a vacuum cleaner and a damp dust-mop. For carpeting or rugs, vacuuming with a vacuum cleaner equipped with a high-efficiency particulate air (HEPA) filter. The dust mite allergic individual should not be in a room which is being cleaned and should wait 1 hour after cleaning before going into the room.  4. Do not sleep on upholstered furniture (eg, couches).  5. If possible removing carpeting, upholstered furniture and drapery from the home is ideal. Horizontal blinds should be eliminated in the rooms where the person spends the most time (bedroom, study, television room). Washable vinyl, roller-type shades are optimal.  6. Remove all non-washable stuffed toys from the bedroom. Wash stuffed toys weekly like sheets and blankets above.  7. Reduce indoor humidity to less than 50%. Inexpensive humidity monitors can be purchased at most hardware stores. Do not use a humidifier as can make the problem worse and are not recommended.  Control of Cockroach Allergen  Cockroach allergen has been identified as an important cause of acute attacks of asthma, especially in urban settings.  There are fifty-five species of cockroach that exist in the [place], however only three, the [place], [place] species produce allergen that can affect patients with Asthma.  Allergens can be obtained from fecal particles, egg casings and secretions from cockroaches.    Remove food sources. Reduce access to water. Seal  access and entry points. Spray runways with 0.5-1% Diazinon or Chlorpyrifos Blow boric acid power under stoves and refrigerator. Place bait stations (hydramethylnon) at feeding sites.   Lifestyle Changes for Controlling GERD When you have GERD, stomach acid feels as if it's backing up toward your mouth. Whether or not you take medication to control your GERD, your symptoms can often be improved with lifestyle changes.   Raise Your Head Reflux is more likely to strike when you're lying down flat, because stomach fluid can flow backward more easily. Raising the head of your bed 4-6 inches can help. To do this: Slide blocks or books under the legs at the head of your bed. Or, place a wedge under the mattress. Many foam stores can make a suitable wedge for you. The wedge should run from your waist to the top of your head. Don't just prop your head on several pillows. This increases pressure on your stomach. It can make GERD worse.  Watch Your Eating Habits Certain foods may increase the acid in your stomach or relax the lower esophageal sphincter, making GERD more likely. It's best to avoid the following: Coffee, tea, and carbonated drinks (with and without caffeine) Fatty, fried, or spicy food  Mint, chocolate, onions, and tomatoes Any other foods that seem to irritate your stomach or cause you pain  Relieve the Pressure Eat smaller meals, even if you have to eat more often. Don't lie down right after you eat. Wait a few hours for your stomach to empty. Avoid tight belts and tight-fitting clothes. Lose excess weight.  Tobacco and Alcohol Avoid smoking tobacco and drinking alcohol. They can make GERD symptoms worse.

## 2021-03-27 LAB — IGE NUT PROF. W/COMPONENT RFLX
F017-IgE Hazelnut (Filbert): <0.1 kU/L
F018-IgE Brazil Nut: <0.1 kU/L
F020-IgE Almond: <0.1 kU/L
F202-IgE Cashew Nut: <0.1 kU/L
F203-IgE Pistachio Nut: <0.1 kU/L
F256-IgE Walnut: <0.1 kU/L
Macadamia Nut, IgE: <0.1 kU/L
Peanut, IgE: <0.1 kU/L
Pecan Nut IgE: <0.1 kU/L

## 2021-03-27 LAB — ALLERGEN, SALMON, F41: Allergen Salmon IgE: <0.1 kU/L

## 2021-03-27 LAB — TRYPTASE: Tryptase: 3.3 ug/L (ref 2.2–13.2)

## 2021-03-27 LAB — ALLERGEN, TUNA F40: Tuna: <0.1 kU/L

## 2021-03-27 NOTE — Progress Notes (Signed)
Please let [name] know that her allergy testing was negative to tree nuts, tuna, and salmon.  Her tryptase level was also normal (which can sometimes be elevated in patients who have frequent reactions to different things).  If she would like to continue avoiding these foods that is okay.  However, if she is interested in eating these foods, we can set up an oral challenge in our office. Thanks.

## 2021-04-13 ENCOUNTER — Encounter: Payer: Self-pay | Admitting: Nurse Practitioner

## 2021-04-23 ENCOUNTER — Encounter: Payer: Self-pay | Admitting: Nurse Practitioner

## 2021-04-23 ENCOUNTER — Ambulatory Visit (INDEPENDENT_AMBULATORY_CARE_PROVIDER_SITE_OTHER): Payer: 59 | Admitting: Nurse Practitioner

## 2021-04-23 ENCOUNTER — Other Ambulatory Visit: Payer: Self-pay

## 2021-04-23 VITALS — BP 120/60 | HR 81 | Temp 98.6°F | Ht 63.0 in | Wt 218.2 lb

## 2021-04-23 DIAGNOSIS — E782 Mixed hyperlipidemia: Secondary | ICD-10-CM | POA: Diagnosis not present

## 2021-04-23 DIAGNOSIS — E559 Vitamin D deficiency, unspecified: Secondary | ICD-10-CM

## 2021-04-23 DIAGNOSIS — M545 Low back pain, unspecified: Secondary | ICD-10-CM

## 2021-04-23 DIAGNOSIS — R110 Nausea: Secondary | ICD-10-CM

## 2021-04-23 DIAGNOSIS — R7309 Other abnormal glucose: Secondary | ICD-10-CM

## 2021-04-23 DIAGNOSIS — Z23 Encounter for immunization: Secondary | ICD-10-CM

## 2021-04-23 DIAGNOSIS — E669 Obesity, unspecified: Secondary | ICD-10-CM | POA: Diagnosis not present

## 2021-04-23 DIAGNOSIS — Z79899 Other long term (current) drug therapy: Secondary | ICD-10-CM

## 2021-04-23 DIAGNOSIS — Z0000 Encounter for general adult medical examination without abnormal findings: Secondary | ICD-10-CM

## 2021-04-23 DIAGNOSIS — N390 Urinary tract infection, site not specified: Secondary | ICD-10-CM | POA: Diagnosis not present

## 2021-04-23 LAB — POCT URINALYSIS DIPSTICK
Bilirubin, UA: NEGATIVE
Blood, UA: NEGATIVE
Glucose, UA: NEGATIVE
Ketones, UA: NEGATIVE
Nitrite, UA: NEGATIVE
Protein, UA: NEGATIVE
Spec Grav, UA: 1.01 (ref 1.010–1.025)
Urobilinogen, UA: 0.2 E.U./dL
pH, UA: 7 (ref 5.0–8.0)

## 2021-04-23 MED ORDER — ONDANSETRON HCL 4 MG PO TABS: 4.0000 mg | ORAL_TABLET | Freq: Every day | ORAL | 0 refills | Status: DC | PRN

## 2021-04-23 NOTE — Progress Notes (Signed)
I,Tianna Badgett,acting as a Education administrator for [employer], FNP.,have documented all relevant documentation on the behalf of [name], FNP,as directed by  [name], FNP while in the presence of [name], [place].  This visit occurred during the SARS-CoV-2 public health emergency.  Safety protocols were in place, including screening questions prior to the visit, additional usage of staff PPE, and extensive cleaning of exam room while observing appropriate contact time as indicated for disinfecting solutions.  Subjective:     Patient ID: [name] , female    DOB: 09-02-1975 , 45 y.o.   MRN: [id number]   Chief Complaint  Patient presents with   Annual Exam    HPI  Here for HM. She is seeing a Dealer for urinary frequency.  She is scheduled to see the Neurologist for her facial discomfort. She is also seeing the Orthopedic for her shoulder pain.   Wt Readings from Last 3 Encounters: 04/23/21 : 218 lb 3.2 oz (99 kg) 03/21/21 : 214 lb (97.1 kg) 12/19/20 : 210 lb (95.3 kg)  She reports she has been on antibiotics continuously due to urinary tract infection. She has been having cloudy urine and clear - changing up on its own. She is also self catherization 1-2 times a day.      Past Medical History:  Diagnosis Date   AKI (acute kidney injury) ([place]) 10/06/2020   Allergy    Anemia    Angio-edema    Anxiety    Apnea 12/08/2019   Arthritis    Asthma    Back pain    Constipation    Exertional dyspnea 03/14/2020   Fatty liver    GERD (gastroesophageal reflux disease)    H/O: hysterectomy    Hx of endometriosis    Inappropriate sinus tachycardia 12/08/2019   Insomnia    Joint pain    Lactose intolerance    Macromastia 07/2011   Migraine    Morbid obesity ([place]) 12/08/2019   Osteoporosis    Urticaria    Vitamin D deficiency      Family History  Problem Relation Age of Onset   Diabetes Mother    Hypertension Mother    Hyperlipidemia Mother    Sleep apnea Mother     Thyroid disease Sister    Dementia Father    Heart attack Maternal Grandmother    Stroke Maternal Grandfather    Asthma Son    Healthy Son    Asthma Daughter    Healthy Daughter    Colon cancer Neg Hx    Colon polyps Neg Hx    Esophageal cancer Neg Hx    Rectal cancer Neg Hx    Stomach cancer Neg Hx      Current Outpatient Medications:    acetaminophen (TYLENOL) 500 MG tablet, Take 1,000 mg by mouth every 6 (six) hours as needed for moderate pain., Disp: , Rfl:    albuterol (VENTOLIN HFA) 108 (90 Base) MCG/ACT inhaler, TAKE 2 PUFFS BY MOUTH EVERY 6 HOURS AS NEEDED FOR WHEEZE OR SHORTNESS OF BREATH, Disp: 6.7 each, Rfl: 11   calcium carbonate (OS-CAL - DOSED IN MG OF ELEMENTAL CALCIUM) 1250 (500 Ca) MG tablet, Take 1 tablet by mouth., Disp: , Rfl:    cetirizine (ZYRTEC) 10 MG tablet, Take 1 tablet (10 mg total) by mouth daily as needed for rhinitis., Disp: 90 tablet, Rfl: 1   clindamycin (CLEOCIN T) 1 % lotion, Apply 1 application topically daily., Disp: , Rfl:    diclofenac Sodium (VOLTAREN)  1 % GEL, Apply 2 g topically 4 (four) times daily as needed (musculoskeletal pain)., Disp: 50 g, Rfl: 0   EPINEPHrine (EPIPEN 2-PAK) 0.3 mg/0.3 mL IJ SOAJ injection, Inject 0.3 mg into the muscle as needed for anaphylaxis., Disp: 2 each, Rfl: 2   esomeprazole (NEXIUM) 40 MG capsule, Take 1 capsule (40 mg total) by mouth 2 (two) times daily before a meal., Disp: 60 capsule, Rfl: 3   famotidine (PEPCID) 20 MG tablet, Take 20 mg by mouth daily., Disp: , Rfl:    fluticasone furoate-vilanterol (BREO ELLIPTA) 100-25 MCG/INH AEPB, Inhale 1 puff into the lungs daily as needed., Disp: 60 each, Rfl: 11   ondansetron (ZOFRAN) 4 MG tablet, Take 1 tablet (4 mg total) by mouth daily as needed for nausea or vomiting., Disp: 30 tablet, Rfl: 0   RESTASIS 0.05 % ophthalmic emulsion, Place 1 drop into both eyes 2 (two) times daily., Disp: , Rfl:    silver sulfADIAZINE (SILVADENE) 1 % cream, SMARTSIG:0.0625 Inch(es)  Topical Twice Daily, Disp: , Rfl:    sucralfate (CARAFATE) 1 g tablet, TAKE 1 TABLET (1 G TOTAL) BY MOUTH EVERY 6 (SIX) HOURS. SLOWLY DISSOLVE TABLET IN 1 TABLESPOON OF DISTILLED WATER BEFORE INGESTION, Disp: 90 tablet, Rfl: 2   tretinoin (RETIN-A) 0.025 % cream, tretinoin 0.025 % topical cream  APPLY APPLICATIONS APPLY A PEA-SIZED AMOUNT TO FACE AT BEDTIME, Disp: , Rfl:    Vitamin D, Ergocalciferol, (DRISDOL) 1.25 MG (50000 UNIT) CAPS capsule, Take 1 capsule (50,000 Units total) by mouth every 7 (seven) days., Disp: 15 capsule, Rfl: 3   Allergies  Allergen Reactions   Aspirin    Ibuprofen    Salmon [Fish Allergy]    Zoledronic Acid Other (See Comments)      The patient states she is post menopausal status.   Negative for: breast discharge, breast lump(s), breast pain and breast self exam. Associated symptoms include abnormal vaginal bleeding. Pertinent negatives include abnormal bleeding (hematology), anxiety, decreased libido, depression, difficulty falling sleep, dyspareunia, history of infertility, nocturia, sexual dysfunction, sleep disturbances, urinary incontinence, urinary urgency, vaginal discharge and vaginal itching. Diet regular; she feels like she needs to eat more per day. The patient states her exercise level is minimal - occasionally 2-3 times a week.   The patient's tobacco use is:  Social History   Tobacco Use  Smoking Status Never  Smokeless Tobacco Never   She has been exposed to passive smoke. The patient's alcohol use is:  Social History   Substance and Sexual Activity  Alcohol Use Yes   Comment: occasional      Review of Systems  Constitutional: Negative.   HENT: Negative.    Eyes: Negative.   Respiratory: Negative.    Cardiovascular: Negative.  Negative for chest pain, palpitations and leg swelling.  Gastrointestinal: Negative.   Endocrine: Negative.   Genitourinary: Negative.   Musculoskeletal: Negative.   Skin: Negative.   Allergic/Immunologic:  Negative.   Neurological: Negative.  Negative for dizziness and headaches.  Hematological: Negative.   Psychiatric/Behavioral: Negative.      Today's Vitals   04/23/21 1445  BP: 120/60  Pulse: 81  Temp: 98.6 F (37 C)  TempSrc: Oral  Weight: 218 lb 3.2 oz (99 kg)  Height: _0  (1.6 m)   Body mass index is 38.65 kg/m.  Wt Readings from Last 3 Encounters:  04/23/21 218 lb 3.2 oz (99 kg)  03/21/21 214 lb (97.1 kg)  12/19/20 210 lb (95.3 kg)    Objective:  Physical  Exam Constitutional:      General: She is not in acute distress.    Appearance: Normal appearance. She is well-developed. She is obese.  HENT:     Head: Normocephalic and atraumatic.     Right Ear: Hearing, tympanic membrane, ear canal and external ear normal. There is no impacted cerumen.     Left Ear: Hearing, tympanic membrane, ear canal and external ear normal. There is no impacted cerumen.     Nose:     Comments: Deferred - masked    Mouth/Throat:     Comments: Deferred - masked Eyes:     General: Lids are normal.     Extraocular Movements: Extraocular movements intact.     Conjunctiva/sclera: Conjunctivae normal.     Pupils: Pupils are equal, round, and reactive to light.     Funduscopic exam:    Right eye: No papilledema.        Left eye: No papilledema.  Neck:     Thyroid: No thyroid mass.     Vascular: No carotid bruit.  Cardiovascular:     Rate and Rhythm: Normal rate and regular rhythm.     Pulses: Normal pulses.     Heart sounds: Normal heart sounds. No murmur heard. Pulmonary:     Effort: Pulmonary effort is normal. No respiratory distress.     Breath sounds: Normal breath sounds. No wheezing.  Chest:     Chest wall: No mass.  Breasts:    Tanner Score is 5.     Right: Normal. No mass or tenderness.     Left: Normal. No mass or tenderness.  Abdominal:     General: Abdomen is flat. Bowel sounds are normal. There is no distension.     Palpations: Abdomen is soft.     Tenderness: There  is no abdominal tenderness.  Genitourinary:    Comments: Deferred Musculoskeletal:        General: No swelling or tenderness. Normal range of motion.     Cervical back: Full passive range of motion without pain, normal range of motion and neck supple.     Right lower leg: No edema.     Left lower leg: No edema.  Lymphadenopathy:     Upper Body:     Right upper body: No supraclavicular, axillary or pectoral adenopathy.     Left upper body: No supraclavicular, axillary or pectoral adenopathy.  Skin:    General: Skin is warm and dry.     Capillary Refill: Capillary refill takes less than 2 seconds.  Neurological:     General: No focal deficit present.     Mental Status: She is alert and oriented to person, place, and time.     Cranial Nerves: No cranial nerve deficit.     Sensory: No sensory deficit.     Motor: No weakness.  Psychiatric:        Mood and Affect: Mood normal.        Behavior: Behavior normal.        Thought Content: Thought content normal.        Judgment: Judgment normal.        Assessment And Plan:     1. Encounter for general adult medical examination w/o abnormal findings Behavior modifications discussed and diet history reviewed.   Pt will continue to exercise regularly and modify diet with low GI, plant based foods and decrease intake of processed foods.  Recommend intake of daily multivitamin, Vitamin D, and calcium.  Recommend mammogram and  colonoscopy for preventive screenings, as well as recommend immunizations that include influenza, TDAP (up to date)  2. Mixed hyperlipidemia Comments: Stable, continue current medications - CMP14+EGFR - Lipid panel  3. Vitamin D deficiency Will check vitamin D level and supplement as needed.    Also encouraged to spend 15 minutes in the sun daily.  - VITAMIN D 25 Hydroxy (Vit-D Deficiency, Fractures)  4. Abnormal glucose Comments: Stable, continue with focusing on healthy diet - Hemoglobin A1c  5. Obesity (BMI  35.0-39.9 without comorbidity) Chronic Discussed healthy diet and regular exercise options  Encouraged to exercise at least 150 minutes per week with 2 days of strength training as tolerated  6. Urinary tract infection without hematuria, site unspecified Comments: Continue follow up with Urology, she is currently on antibiotics to prevent but according to her Urology note she has a current UTI - POCT Urinalysis Dipstick (81002)  7. Encounter for immunization Influenza vaccine administered Encouraged to take Tylenol as needed for fever or muscle aches. - Flu Vaccine QUAD 67moIM (Fluarix, Fluzone & Alfiuria Quad PF)  8. Nausea Comments: This is a chronic problem and she is being followed by GI - ondansetron (ZOFRAN) 4 MG tablet; Take 1 tablet (4 mg total) by mouth daily as needed for nausea or vomiting.  Dispense: 30 tablet; Refill: 0  9. Acute bilateral low back pain without sciatica Comments: Encouraged to stretch regularly, negative straight leg raise.   10. Other long term (current) drug therapy - CBC     Patient was given opportunity to ask questions. Patient verbalized understanding of the plan and was able to repeat key elements of the plan. All questions were answered to their satisfaction.   JMinette Brine FNP   I, JMinette Brine FNP, have reviewed all documentation for this visit. The documentation on 04/26/21 for the exam, diagnosis, procedures, and orders are all accurate and complete.  THE PATIENT IS ENCOURAGED TO PRACTICE SOCIAL DISTANCING DUE TO THE COVID-19 PANDEMIC.

## 2021-04-23 NOTE — Patient Instructions (Signed)

## 2021-04-24 LAB — CMP14+EGFR
ALT: 18 IU/L (ref 0–32)
AST: 26 IU/L (ref 0–40)
Albumin/Globulin Ratio: 1.7 (ref 1.2–2.2)
Albumin: 4.8 g/dL (ref 3.8–4.8)
Alkaline Phosphatase: 63 IU/L (ref 44–121)
BUN/Creatinine Ratio: 7 — ABNORMAL LOW (ref 9–23)
BUN: 5 mg/dL — ABNORMAL LOW (ref 6–24)
Bilirubin Total: 0.2 mg/dL (ref 0.0–1.2)
CO2: 27 mmol/L (ref 20–29)
Calcium: 10 mg/dL (ref 8.7–10.2)
Chloride: 99 mmol/L (ref 96–106)
Creatinine, Ser: 0.76 mg/dL (ref 0.57–1.00)
Globulin, Total: 2.9 g/dL (ref 1.5–4.5)
Glucose: 86 mg/dL (ref 70–99)
Potassium: 3.9 mmol/L (ref 3.5–5.2)
Sodium: 141 mmol/L (ref 134–144)
Total Protein: 7.7 g/dL (ref 6.0–8.5)
eGFR: 98 mL/min/{1.73_m2} (ref >59)

## 2021-04-24 LAB — CBC
Hematocrit: 35.3 % (ref 34.0–46.6)
Hemoglobin: 11.2 g/dL (ref 11.1–15.9)
MCH: 24.6 pg — ABNORMAL LOW (ref 26.6–33.0)
MCHC: 31.7 g/dL (ref 31.5–35.7)
MCV: 77 fL — ABNORMAL LOW (ref 79–97)
Platelets: 307 10*3/uL (ref 150–450)
RBC: 4.56 x10E6/uL (ref 3.77–5.28)
RDW: 12 % (ref 11.7–15.4)
WBC: 3.8 10*3/uL (ref 3.4–10.8)

## 2021-04-24 LAB — HEMOGLOBIN A1C
Est. average glucose Bld gHb Est-mCnc: 123 mg/dL
Hgb A1c MFr Bld: 5.9 % — ABNORMAL HIGH (ref 4.8–5.6)

## 2021-04-24 LAB — LIPID PANEL
Chol/HDL Ratio: 3.3 ratio (ref 0.0–4.4)
Cholesterol, Total: 212 mg/dL — ABNORMAL HIGH (ref 100–199)
HDL: 64 mg/dL (ref >39)
LDL Chol Calc (NIH): 134 mg/dL — ABNORMAL HIGH (ref 0–99)
Triglycerides: 78 mg/dL (ref 0–149)
VLDL Cholesterol Cal: 14 mg/dL (ref 5–40)

## 2021-04-24 LAB — VITAMIN D 25 HYDROXY (VIT D DEFICIENCY, FRACTURES): Vit D, 25-Hydroxy: 72.9 ng/mL (ref 30.0–100.0)

## 2021-04-26 ENCOUNTER — Encounter: Payer: Self-pay | Admitting: Nurse Practitioner

## 2021-06-23 ENCOUNTER — Other Ambulatory Visit: Payer: Self-pay | Admitting: Internal Medicine

## 2021-06-23 ENCOUNTER — Other Ambulatory Visit: Payer: Self-pay | Admitting: Physician Assistant

## 2021-07-10 ENCOUNTER — Ambulatory Visit: Payer: Medicaid Other | Admitting: Nurse Practitioner

## 2021-07-11 ENCOUNTER — Encounter: Payer: Self-pay | Admitting: Gastroenterology

## 2021-07-11 ENCOUNTER — Ambulatory Visit: Payer: Managed Care, Other (non HMO) | Admitting: Gastroenterology

## 2021-07-11 VITALS — BP 134/70 | HR 60 | Ht 63.0 in | Wt 226.0 lb

## 2021-07-11 DIAGNOSIS — R1013 Epigastric pain: Secondary | ICD-10-CM

## 2021-07-11 DIAGNOSIS — R110 Nausea: Secondary | ICD-10-CM

## 2021-07-11 DIAGNOSIS — R6881 Early satiety: Secondary | ICD-10-CM | POA: Diagnosis not present

## 2021-07-11 MED ORDER — FDGARD 25-20.75 MG PO CAPS: ORAL_CAPSULE | ORAL | 0 refills | Status: DC

## 2021-07-11 NOTE — Progress Notes (Signed)
HPI :  46 year old female with chronic epigastric pain, nausea, history of dysphagia, here for follow-up visit.  Recall she has had a history of reflux, dysphagia, epigastric pain, early satiety, ongoing for years.  She has had an extensive evaluation as outlined below.  This past June she was admitted for a spontaneous bladder perforation from which she recovered.  She had epigastric pain/dyspepsia when we last saw her over the summer, she had an EGD with me in August which showed a small hiatal hernia but no concerning pathology.  We discussed using FD guard as needed.  She states she cannot remember if she even tried FD guard.  Essentially her symptoms persist.  She has discomfort in her epigastric to right upper quadrant which can come and go, bothering her more so over the past 6 months.  She has ongoing early satiety with a poor appetite.  She frequently feels nauseated.  She does not really vomit.  Her stomach can remain upset for some time, although unclear if eating truly makes her pain worse or not.  Sometimes she has positional component to her pain when she lies on her left side it does not hurt as much.  Recall she has had EGD, CT scan, gastric emptying study, etc. to work-up her symptoms in the past.  Remotely in 2011 she had a positive gastric emptying study and diagnosed with gastroparesis.  She was tried on Reglan at that time and reportedly had twitching and did not tolerate it.  I repeated a gastric emptying study in 2019 which was normal.  That being said she continues to have nausea frequently, takes Zofran which does help, ongoing early satiety with poor appetite.  We discussed other options.  No alarm symptoms or weight loss otherwise.  She has a history of cholecystectomy.  She does have regurgitation that bothers her but no pyrosis she is on Nexium 40 mg twice daily which controls her heartburn/reflux symptoms.  She does have chronic dysphagia that has been worked up extensively.   She states this is not really bothering her too much lately, is mild.   Prior workup: Capsule endoscopy - 06/23/17 - complete study with good prep, no clear cause for iron deficiency EGD 02/20/2017 - 2cm HH, otherwise normal exam, biopsies negative for HP Colonoscopy 02/20/2017 - focal mild erythema in the rectum - biopsied showed no evidence of colitis, otherwise normal colon and ileum  MRI liver / abdomen - 02/10/2017 - focal fatty infiltration, normal biliary tree, otherwise no pathology noted in the abdomen   Historic evaluation: CT scan neck 12/02/16 - normal CT angio of chest 12/02/2016 - normal EGD 08/19/2013 - Dr. Alycia Rossetti - normal esophagus, mild gastritis Esophageal manometry 2015 - normal PH study 2015 - normal exam, Demeester score of 5.7, positive symptom index for regurgitation and reflux EGG normal 2015 Normal hydrogen breath test 2015 Normal gastric emptying scan 2015 GES - delayed in 2011 - Reglan caused twitching and it was stopped.    GES 11/13/2017 - normal   Manometry / 24 hr pH impedance testing was recommended   Esophageal manometry 01/12/18 - normal PH impedance study 01/12/18 - Demeester score 9, SAP positively correlates with reflux, good acid suppression on PPI   Barium swallow 09/02/19 - IMPRESSION: Mild intermittent esophageal dysmotility. Small sliding hiatal hernia. Otherwise unremarkable esophagram as described.    01/05/2020 EGD done for dysphagia.  She had a 3 cm hiatal hernia and never thing else was otherwise normal.   EGD 12/19/20 -  -  A 2 cm hiatal hernia was present. - The exam of the esophagus was otherwise normal. - Patchy erythematous mucosa was found in the gastric body without focal ulceration. - The exam of the stomach was otherwise normal. - Biopsies were taken with a cold forceps in the gastric body, at the incisura and in the gastric antrum for Helicobacter pylori testing. - The duodenal bulb and second portion of the duodenum were  normal.  Surgical [P], gastric antrum and gastric body - ANTRAL AND OXYNTIC MUCOSA WITH SLIGHT CHRONIC INFLAMMATION. [place] NEGATIVE FOR HELICOBACTER PYLORI. - NO INTESTINAL METAPLASIA, DYSPLASIA OR CARCINOMA.  Past Medical History:  Diagnosis Date   AKI (acute kidney injury) ([facility]) 10/06/2020   Allergy    Anemia    Angio-edema    Anxiety    Apnea 12/08/2019   Arthritis    Asthma    Back pain    Constipation    Exertional dyspnea 03/14/2020   Fatty liver    GERD (gastroesophageal reflux disease)    H/O: hysterectomy    Hx of endometriosis    Inappropriate sinus tachycardia 12/08/2019   Insomnia    Joint pain    Lactose intolerance    Macromastia 07/2011   Migraine    Morbid obesity ([facility]) 12/08/2019   Osteoporosis    Urticaria    Vitamin D deficiency      Past Surgical History:  Procedure Laterality Date   50 HOUR PH STUDY N/A 01/12/2018   Procedure: 24 HOUR PH STUDY;  Surgeon: Napoleon Form, MD;  Location: [facility];  Service: Endoscopy;  Laterality: N/A;   ABDOMINAL HYSTERECTOMY  12/12/2003   ADENOIDECTOMY  05/2011   ANKLE ARTHROSCOPY  12/29/2007   right; with extensive debridement   BLADDER NECK RECONSTRUCTION  01/14/2011   Procedure: BLADDER NECK REPAIR;  Surgeon: Reva Bores, MD;  Location: [facility];  Service: Gynecology;  Laterality: N/A;  Laparoscopic Repair of Incidental Cystotomy   BREAST REDUCTION SURGERY  08/05/2011   Procedure: MAMMARY REDUCTION  (BREAST);  Surgeon: Louisa Second, MD;  Location: [facility];  Service: Plastics;  Laterality: Bilateral;  bilateral   BREAST SURGERY  2012   breast reduction   CESAREAN SECTION  12/29/2000; 1994   DILATION AND CURETTAGE OF UTERUS  07/08/2003   open laparoscopy   ESOPHAGEAL MANOMETRY N/A 01/12/2018   Procedure: ESOPHAGEAL MANOMETRY (EM);  Surgeon: Napoleon Form, MD;  Location: [facility];  Service: Endoscopy;  Laterality: N/A;   GIVENS CAPSULE STUDY N/A 06/23/2017   Procedure: GIVENS  CAPSULE STUDY;  Surgeon: Benancio Deeds, MD;  Location: [facility];  Service: Gastroenterology;  Laterality: N/A;   LAPAROSCOPIC SALPINGOOPHERECTOMY  01/14/2011   left   PH IMPEDANCE STUDY N/A 01/12/2018   Procedure: PH IMPEDANCE STUDY;  Surgeon: Napoleon Form, MD;  Location: [facility];  Service: Endoscopy;  Laterality: N/A;   REPAIR PERONEAL TENDONS ANKLE  01/03/2010   repair right subluxing peroneal tendons   RESECTION DISTAL CLAVICAL Right 09/25/2017   Procedure: RESECTION DISTAL CLAVICAL;  Surgeon: Bjorn Pippin, MD;  Location: [facility];  Service: Orthopedics;  Laterality: Right;   RIGHT OOPHORECTOMY     with lysis of adhesions   SHOULDER ACROMIOPLASTY Right 09/25/2017   Procedure: SHOULDER ACROMIOPLASTY;  Surgeon: Bjorn Pippin, MD;  Location: [facility];  Service: Orthopedics;  Laterality: Right;   SHOULDER ARTHROSCOPY WITH DEBRIDEMENT AND BICEP TENDON REPAIR Right 09/25/2017   Procedure: SHOULDER ARTHROSCOPY WITH DEBRIDEMENT AND BICEP  TENDON REPAIR;  Surgeon: Bjorn Pippin, MD;  Location: [facility];  Service: Orthopedics;  Laterality: Right;   SHOULDER ARTHROSCOPY WITH ROTATOR CUFF REPAIR Right 09/25/2017   Procedure: SHOULDER ARTHROSCOPY WITH ROTATOR CUFF REPAIR;  Surgeon: Bjorn Pippin, MD;  Location: [facility];  Service: Orthopedics;  Laterality: Right;   TONSILLECTOMY  as a child   TUBAL LIGATION  12/29/2000   UPPER GASTROINTESTINAL ENDOSCOPY     Family History  Problem Relation Age of Onset   Diabetes Mother    Hypertension Mother    Hyperlipidemia Mother    Sleep apnea Mother    Thyroid disease Sister    Dementia Father    Heart attack Maternal Grandmother    Stroke Maternal Grandfather    Asthma Son    Healthy Son    Asthma Daughter    Healthy Daughter    Colon cancer Neg Hx    Colon polyps Neg Hx    Esophageal cancer Neg Hx    Rectal cancer Neg Hx    Stomach cancer Neg Hx    Social History    Tobacco Use   Smoking status: Never   Smokeless tobacco: Never  Vaping Use   Vaping Use: Never used  Substance Use Topics   Alcohol use: Yes    Comment: occasional    Drug use: No   Current Outpatient Medications  Medication Sig Dispense Refill   acetaminophen (TYLENOL) 500 MG tablet Take 1,000 mg by mouth every 6 (six) hours as needed for moderate pain.     albuterol (VENTOLIN HFA) 108 (90 Base) MCG/ACT inhaler TAKE 2 PUFFS BY MOUTH EVERY 6 HOURS AS NEEDED FOR WHEEZE OR SHORTNESS OF BREATH 6.7 each 11   calcium carbonate (OS-CAL - DOSED IN MG OF ELEMENTAL CALCIUM) 1250 (500 Ca) MG tablet Take 1 tablet by mouth.     cetirizine (ZYRTEC) 10 MG tablet TAKE 1 TABLET (10 MG TOTAL) BY MOUTH DAILY AS NEEDED FOR RHINITIS. 90 tablet 1   clindamycin (CLEOCIN T) 1 % lotion Apply 1 application topically daily.     EPINEPHrine (EPIPEN 2-PAK) 0.3 mg/0.3 mL IJ SOAJ injection Inject 0.3 mg into the muscle as needed for anaphylaxis. 2 each 2   esomeprazole (NEXIUM) 40 MG capsule TAKE 1 CAPSULE (40 MG TOTAL) BY MOUTH 2 (TWO) TIMES DAILY BEFORE A MEAL. 180 capsule 1   famotidine (PEPCID) 20 MG tablet Take 20 mg by mouth daily.     fluticasone furoate-vilanterol (BREO ELLIPTA) 100-25 MCG/INH AEPB Inhale 1 puff into the lungs daily as needed. 60 each 11   ondansetron (ZOFRAN) 4 MG tablet Take 1 tablet (4 mg total) by mouth daily as needed for nausea or vomiting. 30 tablet 0   RESTASIS 0.05 % ophthalmic emulsion Place 1 drop into both eyes 2 (two) times daily.     tretinoin (RETIN-A) 0.025 % cream tretinoin 0.025 % topical cream  APPLY APPLICATIONS APPLY A PEA-SIZED AMOUNT TO FACE AT BEDTIME     Vitamin D, Ergocalciferol, (DRISDOL) 1.25 MG (50000 UNIT) CAPS capsule Take 1 capsule (50,000 Units total) by mouth every 7 (seven) days. 15 capsule 3   No current facility-administered medications for this visit.   Allergies  Allergen Reactions   Aspirin    Ibuprofen    Salmon [Fish Allergy]    Zoledronic  Acid Other (See Comments)     Review of Systems: All systems reviewed and negative except where noted in HPI.   Lab Results  Component  Value Date   WBC 3.8 04/23/2021   HGB 11.2 04/23/2021   HCT 35.3 04/23/2021   MCV 77 (L) 04/23/2021   PLT 307 04/23/2021    Lab Results  Component Value Date   CREATININE 0.76 04/23/2021   BUN 5 (L) 04/23/2021   NA 141 04/23/2021   K 3.9 04/23/2021   CL 99 04/23/2021   CO2 27 04/23/2021    Lab Results  Component Value Date   ALT 18 04/23/2021   AST 26 04/23/2021   ALKPHOS 63 04/23/2021   BILITOT 0.2 04/23/2021     Physical Exam: BP 134/70    Pulse 60    Ht 5\' 3"  (1.6 m)    Wt 226 lb (102.5 kg)    LMP 12/27/2003    BMI 40.03 kg/m  Constitutional: Pleasant,well-developed, female in no acute distress. Abdominal: Soft, nondistended, nontender.  There are no masses palpable.  Extremities: no edema Lymphadenopathy: No cervical adenopathy noted. Neurological: Alert and oriented to person place and time. Skin: Skin is warm and dry. No rashes noted. Psychiatric: Normal mood and affect. Behavior is normal.   ASSESSMENT AND PLAN: 46 year old female here for reassessment of the following:  Chronic epigastric pain Chronic nausea Early satiety  Extensive work-up in years past.  She has had persistent symptoms since have last seen her in August.  EGD did not show any concerning pathology.  I think she likely has a functional GI tract disorder driving her upper tract symptoms, we discussed this.  She previously had a positive gastric emptying study but the last 1 looked okay.  She does recall being on Reglan and did not tolerate it at all and does not want to have another trial of it.  I do think trying FD guard is reasonable for component of dyspepsia, she did not take it previously and we will try that.  Otherwise given her chronic nausea, early satiety, I think a trial of Remeron may be reasonable to see if we can get better control of this.   We will start 15 mg nightly and see how she does with it.  Discussed risks of the medication and she wants to proceed.  I will see her back in 2 weeks for follow-up.  Most of her pain is in the upper abdomen and I think related to dyspepsia/functional tract disorder, however also has fleeting pains in her mid abdomen at times.  If she does not respond to Remeron may consider a trial of Cymbalta for chronic pain syndrome.  If she has significant interval worsening over time may consider another CT scan but last imaged June of last year after recovering from her bladder issue which looked okay in the setting of the symptoms.  She agreed  Plan: - FD gard trial - samples provided - start Remeron 15mg  q HS  - follow up in 2 months, consider cymbalta pending her course - she declines another trial of Reglan at this time - consider repeat CT if worsening but symptoms chronic / stable over time.  Harlin RainSteve Graciemae Delisle, MD [facility]

## 2021-07-11 NOTE — Patient Instructions (Addendum)
If you are age 46 or older, your body mass index should be between 23-30. Your Body mass index is 40.03 kg/m?[name] If this is out of the aforementioned range listed, please consider follow up with your Primary Care Provider. ? ?If you are age 41 or younger, your body mass index should be between 19-25. Your Body mass index is 40.03 kg/m?[name] If this is out of the aformentioned range listed, please consider follow up with your Primary Care Provider.  ? ?________________________________________________________ ? ?The [place] [facility] providers would like to encourage you to use [facility] to communicate with providers for non-urgent requests or questions.  Due to long hold times on the telephone, sending your provider a message by [facility] may be a faster and more efficient way to get a response.  Please allow 48 business hours for a response.  Please remember that this is for non-urgent requests.  ?_______________________________________________________ ? ? ?We have sent the following medications to your pharmacy for you to pick up at your convenience: ?Remeron 15 mg: Take once daily at bedtime ? ?Please purchase the following medications over the counter and take as directed: ?FDGard: Take as directed ? ?You have been scheduled for a follow up appointment on Tuesday, 09-21-21 at 9:50 am. ? ?Thank you for entrusting me with your care and for choosing [facility], ?Dr. Ileene Patrick ? ? ?

## 2021-07-16 NOTE — Progress Notes (Unsigned)
FOLLOW UP Date of Service/Encounter:  07/16/21   Subjective:  [name] (DOB: [birth year]) is a 46 y.o. female who [facility] in re-evaluation of the following: *** History obtained from: chart review and {Persons; PED relatives w/patient:19415::"patient"}.  For Review, LV was on ***  with {Blank single:19197::"Dr. Kim","Dr. Kozlow","Dr. Bardelas","Dr. Bobbitt","Dr. Gallagher","Dr. Eugenio Hoes, FNP","Christine Amada Jupiter, FNP","Dr. Lomasney","Dr.Sir Mallis"} seen for ***.    Previous diagnostics:  - allergy testing + tree pollen, dust mite, and cockroach - hx of salmon allergy: salmon IgE negative 03/21/22 - baseline tryptase normal (3.3) 03/1622  Prior gi workup: "Capsule endoscopy - 06/23/17 - complete study with good prep, no clear cause for iron deficiency EGD 02/20/2017 - 2cm HH, otherwise normal exam, biopsies negative for HP Colonoscopy 02/20/2017 - focal mild erythema in the rectum - biopsied showed no evidence of colitis, otherwise normal colon and ileum  MRI liver / abdomen - 02/10/2017 - focal fatty infiltration, normal biliary tree, otherwise no pathology noted in the abdomen   Historic evaluation: CT scan neck 12/02/16 - normal CT angio of chest 12/02/2016 - normal EGD 08/19/2013 - Dr. Alycia Rossetti - normal esophagus, mild gastritis Esophageal manometry 2015 - normal PH study 2015 - normal exam, Demeester score of 5.7, positive symptom index for regurgitation and reflux EGG normal 2015 Normal hydrogen breath test 2015 Normal gastric emptying scan 2015 GES - delayed in 2011 - Reglan caused twitching and it was stopped. GES 11/13/2017 - normal Manometry / 24 hr pH impedance testing was recommended Esophageal manometry 01/12/18 - normal PH impedance study 01/12/18 - Demeester score 9, SAP positively correlates with reflux, good acid suppression on PPI Barium swallow 09/02/19 - IMPRESSION: Mild intermittent esophageal dysmotility. Small  sliding hiatal hernia. Otherwise unremarkable esophagram as described. 01/05/2020 EGD done for dysphagia.  She had a 3 cm hiatal hernia and never thing else was otherwise normal. EGD 12/19/20 -  - A 2 cm hiatal hernia was present. - The exam of the esophagus was otherwise normal. - Patchy erythematous mucosa was found in the gastric body without focal ulceration. - The exam of the stomach was otherwise normal. - Biopsies were taken with a cold forceps in the gastric body, at the incisura and in the gastric antrum for Helicobacter pylori testing. - The duodenal bulb and second portion of the duodenum were normal.   Surgical [P], gastric antrum and gastric body - ANTRAL AND OXYNTIC MUCOSA WITH SLIGHT CHRONIC INFLAMMATION. [place] NEGATIVE FOR HELICOBACTER PYLORI. - NO INTESTINAL METAPLASIA, DYSPLASIA OR CARCINOMA.  Allergies as of 07/18/2021       Reactions   Aspirin    Ibuprofen    Salmon [fish Allergy]    Zoledronic Acid Other (See Comments)        Medication List        Accurate as of July 16, 2021  4:57 PM. If you have any questions, ask your nurse or doctor.          acetaminophen 500 MG tablet Commonly known as: TYLENOL Take 1,000 mg by mouth every 6 (six) hours as needed for moderate pain.   albuterol 108 (90 Base) MCG/ACT inhaler Commonly known as: VENTOLIN HFA TAKE 2 PUFFS BY MOUTH EVERY 6 HOURS AS NEEDED FOR WHEEZE OR SHORTNESS OF BREATH   calcium carbonate 1250 (500 Ca) MG tablet Commonly known as: OS-CAL - dosed in mg of elemental calcium Take 1 tablet by mouth.   cetirizine 10 MG tablet Commonly known as:  ZYRTEC TAKE 1 TABLET (10 MG TOTAL) BY MOUTH DAILY AS NEEDED FOR RHINITIS.   clindamycin 1 % lotion Commonly known as: CLEOCIN T Apply 1 application topically daily.   EPINEPHrine 0.3 mg/0.3 mL Soaj injection Commonly known as: EpiPen 2-Pak Inject 0.3 mg into the muscle as needed for anaphylaxis.   esomeprazole 40 MG capsule Commonly  known as: NEXIUM TAKE 1 CAPSULE (40 MG TOTAL) BY MOUTH 2 (TWO) TIMES DAILY BEFORE A MEAL.   famotidine 20 MG tablet Commonly known as: PEPCID Take 20 mg by mouth daily.   FDgard 25-20.75 MG Caps Generic drug: [place] Oil-Levomenthol Take as directed   fluticasone furoate-vilanterol 100-25 MCG/INH Aepb Commonly known as: Breo Ellipta Inhale 1 puff into the lungs daily as needed.   ondansetron 4 MG tablet Commonly known as: Zofran Take 1 tablet (4 mg total) by mouth daily as needed for nausea or vomiting.   Restasis 0.05 % ophthalmic emulsion Generic drug: cycloSPORINE Place 1 drop into both eyes 2 (two) times daily.   tretinoin 0.025 % cream Commonly known as: RETIN-A tretinoin 0.025 % topical cream  APPLY APPLICATIONS APPLY A PEA-SIZED AMOUNT TO FACE AT BEDTIME   Vitamin D (Ergocalciferol) 1.25 MG (50000 UNIT) Caps capsule Commonly known as: DRISDOL Take 1 capsule (50,000 Units total) by mouth every 7 (seven) days.       Past Medical History:  Diagnosis Date   AKI (acute kidney injury) (HCC) 10/06/2020   Allergy    Anemia    Angio-edema    Anxiety    Apnea 12/08/2019   Arthritis    Asthma    Back pain    Constipation    Exertional dyspnea 03/14/2020   Fatty liver    GERD (gastroesophageal reflux disease)    H/O: hysterectomy    Hx of endometriosis    Inappropriate sinus tachycardia 12/08/2019   Insomnia    Joint pain    Lactose intolerance    Macromastia 07/2011   Migraine    Morbid obesity (HCC) 12/08/2019   Osteoporosis    Urticaria    Vitamin D deficiency    Past Surgical History:  Procedure Laterality Date   58 HOUR PH STUDY N/A 01/12/2018   Procedure: 24 HOUR PH STUDY;  Surgeon: Napoleon Form, MD;  Location: [facility];  Service: Endoscopy;  Laterality: N/A;   ABDOMINAL HYSTERECTOMY  12/12/2003   ADENOIDECTOMY  05/2011   ANKLE ARTHROSCOPY  12/29/2007   right; with extensive debridement   BLADDER NECK RECONSTRUCTION  01/14/2011   Procedure: BLADDER  NECK REPAIR;  Surgeon: Reva Bores, MD;  Location: [facility];  Service: Gynecology;  Laterality: N/A;  Laparoscopic Repair of Incidental Cystotomy   BREAST REDUCTION SURGERY  08/05/2011   Procedure: MAMMARY REDUCTION  (BREAST);  Surgeon: Louisa Second, MD;  Location: [facility];  Service: Plastics;  Laterality: Bilateral;  bilateral   BREAST SURGERY  2012   breast reduction   CESAREAN SECTION  12/29/2000; 1994   DILATION AND CURETTAGE OF UTERUS  07/08/2003   open laparoscopy   ESOPHAGEAL MANOMETRY N/A 01/12/2018   Procedure: ESOPHAGEAL MANOMETRY (EM);  Surgeon: Napoleon Form, MD;  Location: [facility];  Service: Endoscopy;  Laterality: N/A;   GIVENS CAPSULE STUDY N/A 06/23/2017   Procedure: GIVENS CAPSULE STUDY;  Surgeon: Benancio Deeds, MD;  Location: [facility];  Service: Gastroenterology;  Laterality: N/A;   LAPAROSCOPIC SALPINGOOPHERECTOMY  01/14/2011   left   PH IMPEDANCE STUDY N/A 01/12/2018   Procedure: PH IMPEDANCE  STUDY;  Surgeon: Napoleon FormNandigam, Kavitha V, MD;  [facility];  Service: Endoscopy;  Laterality: N/A;   REPAIR PERONEAL TENDONS ANKLE  01/03/2010   repair right subluxing peroneal tendons   RESECTION DISTAL CLAVICAL Right 09/25/2017   Procedure: RESECTION DISTAL CLAVICAL;  Surgeon: Bjorn PippinVarkey, Dax T, MD;  Location: [facility];  Service: Orthopedics;  Laterality: Right;   RIGHT OOPHORECTOMY     with lysis of adhesions   SHOULDER ACROMIOPLASTY Right 09/25/2017   Procedure: SHOULDER ACROMIOPLASTY;  Surgeon: Bjorn PippinVarkey, Dax T, MD;  Location: [facility];  Service: Orthopedics;  Laterality: Right;   SHOULDER ARTHROSCOPY WITH DEBRIDEMENT AND BICEP TENDON REPAIR Right 09/25/2017   Procedure: SHOULDER ARTHROSCOPY WITH DEBRIDEMENT AND BICEP TENDON REPAIR;  Surgeon: Bjorn PippinVarkey, Dax T, MD;  Location: [facility];  Service: Orthopedics;  Laterality: Right;   SHOULDER ARTHROSCOPY WITH ROTATOR CUFF REPAIR Right 09/25/2017    Procedure: SHOULDER ARTHROSCOPY WITH ROTATOR CUFF REPAIR;  Surgeon: Bjorn PippinVarkey, Dax T, MD;  Location: [facility];  Service: Orthopedics;  Laterality: Right;   TONSILLECTOMY  as a child   TUBAL LIGATION  12/29/2000   UPPER GASTROINTESTINAL ENDOSCOPY     Otherwise, there have been no changes to her past medical history, surgical history, family history, or social history.  ROS: All others negative except as noted per HPI.   Objective:  LMP 12/27/2003  There is no height or weight on file to calculate BMI. Physical Exam: General Appearance:  Alert, cooperative, no distress, appears stated age  Head:  Normocephalic, without obvious abnormality, atraumatic  Eyes:  Conjunctiva clear, EOM's intact  Nose: Nares normal, {Blank multiple:19196:a:"***","hypertrophic turbinates","normal mucosa","no visible anterior polyps","septum midline"}  Throat: Lips, tongue normal; teeth and gums normal, {Blank multiple:19196:a:"***","normal posterior oropharynx","tonsils 2+","tonsils 3+","no tonsillar exudate","+ cobblestoning"}  Neck: Supple, symmetrical  Lungs:   {Blank multiple:19196:a:"***","clear to auscultation bilaterally","end-expiratory wheezing","wheezing throughout"}, Respirations unlabored, {Blank multiple:19196:a:"***","no coughing","intermittent dry coughing"}  Heart:  {Blank multiple:19196:a:"***","regular rate and rhythm","no murmur"}, Appears well perfused  Extremities: No edema  Skin: Skin color, texture, turgor normal, no rashes or lesions on visualized portions of skin  Neurologic: No gross deficits   Reviewed: ***  Spirometry:  Tracings reviewed. Her effort: {Blank single:19197::"Good reproducible efforts.","It was hard to get consistent efforts and there is a question as to whether this reflects a maximal maneuver.","Poor effort, data can not be interpreted.","Variable effort-results affected.","decent for first attempt at spirometry."} FVC: ***L FEV1: ***L, ***%  predicted FEV1/FVC ratio: ***% Interpretation: {Blank single:19197::"Spirometry consistent with mild obstructive disease","Spirometry consistent with moderate obstructive disease","Spirometry consistent with severe obstructive disease","Spirometry consistent with possible restrictive disease","Spirometry consistent with mixed obstructive and restrictive disease","Spirometry uninterpretable due to technique","Spirometry consistent with normal pattern","No overt abnormalities noted given today's efforts"}.  Please see scanned spirometry results for details.  Skin Testing: {Blank single:19197::"Select foods","Environmental allergy panel","Environmental allergy panel and select foods","Food allergy panel","None","Deferred due to recent antihistamines use"}. Positive test to: ***. Negative test to: ***.  Results discussed with patient/family.   {Blank single:19197::"Allergy testing results were read and interpreted by myself, documented by clinical staff."," "}  Assessment/Plan   ***  Tonny BollmanErin Yoshika Vensel, MD  [facility]

## 2021-07-18 ENCOUNTER — Other Ambulatory Visit: Payer: Self-pay

## 2021-07-18 ENCOUNTER — Ambulatory Visit (INDEPENDENT_AMBULATORY_CARE_PROVIDER_SITE_OTHER): Payer: Managed Care, Other (non HMO) | Admitting: Internal Medicine

## 2021-07-18 ENCOUNTER — Encounter: Payer: Self-pay | Admitting: Internal Medicine

## 2021-07-18 VITALS — BP 108/62 | HR 59 | Temp 97.0°F | Resp 16 | Ht 64.0 in | Wt 231.4 lb

## 2021-07-18 DIAGNOSIS — T781XXA Other adverse food reactions, not elsewhere classified, initial encounter: Secondary | ICD-10-CM

## 2021-07-18 NOTE — Patient Instructions (Addendum)
Abdominal pain, bloating, trouble sleeping-do not suspect food allergy:  ?- food allergy testing via lab to basic foods and rice ?- previous extensive food allergy panel negative in 2021  ?- blood work to tuna, salmon, peanuts and tree nut panel negative in 03/2021 ?- consider food elimination diet to see if you have a food intolerance ?- Food Elimination Diet Instructions ?- keep a food diary at least 2-4 weeks ?- make note if any foods consistently make symptoms worse  ?- if so, eliminate these foods from your diet for 2-4 weeks.   ?- If symptoms do not improve, stop food elimination diet.   ?- If symptoms do improve, then one by one reintroduce foods and make note if symptoms return.   ?- If they return following reintroduction, consider eliminating or reducing intake of each specific food causing symptoms.   ?- consider FODMAP diet-handout provided. ?- continue to follow-up with GI for symptomatic control, consider nutrition consult ?-Can schedule an oral challenge to nuts, tuna, and salmon if interested; otherwise avoid these foods ? ?Allergic rhinitis-controlled ?Continue allergen avoidance measures directed toward tree pollen, dust mite, and cockroach as listed below, continue an antihistamine once a day as needed for runny nose or itch ?Continue Flonase 1 to 2 sprays in each nostril once a day as needed for stuffy nose.  In the right nostril, point the applicator out toward the right ear. In the left nostril, point the applicator out toward the left ear ?Consider saline nasal rinses as needed for nasal symptoms. Use this before any medicated nasal sprays for best result ?Begin nasal saline gel as needed for dry nostrils ? ?Allergic conjunctivitis ?Some over the counter eye drops include Pataday one drop in each eye once a day as needed for red, itchy eyes OR Zaditor one drop in each eye twice a day as needed for red itchy eyes. ? ?Reflux ?Continue esomeprazole 40 mg once a day as previously prescribed by  your GI specialist ?Continue dietary and lifestyle modifications as listed below ? ?Call the clinic if this treatment plan is not working well for you ? ?Follow up in 6 months or sooner if needed. ? ?Reducing Pollen Exposure ?The American Academy of Allergy, Asthma and Immunology suggests the following steps to reduce your exposure to pollen during allergy seasons. ?Do not hang sheets or clothing out to dry; pollen may collect on these items. ?Do not mow lawns or spend time around freshly cut grass; mowing stirs up pollen. ?Keep windows closed at night.  Keep car windows closed while driving. ?Minimize morning activities outdoors, a time when pollen counts are usually at their highest. ?Stay indoors as much as possible when pollen counts or humidity is high and on windy days when pollen tends to remain in the air longer. ?Use air conditioning when possible.  Many air conditioners have filters that trap the pollen spores. ?Use a HEPA room air filter to remove pollen form the indoor air you breathe. ? ? ?Control of Dust Mite Allergen ?Dust mites play a major role in allergic asthma and rhinitis. They occur in environments with high humidity wherever human skin is found. Dust mites absorb humidity from the atmosphere (ie, they do not drink) and feed on organic matter (including shed human and animal skin). Dust mites are a microscopic type of insect that you cannot see with the naked eye. High levels of dust mites have been detected from mattresses, pillows, carpets, upholstered furniture, bed covers, clothes, soft toys and any woven  material. The principal allergen of the dust mite is found in its feces. A gram of dust may contain 1,000 mites and 250,000 fecal particles. Mite antigen is easily measured in the air during house cleaning activities. Dust mites do not bite and do not cause harm to humans, other than by triggering allergies/asthma. ? ?Ways to decrease your exposure to dust mites in your home: ? ?1. Encase  mattresses, box springs and pillows with a mite-impermeable barrier or cover ? ?2. Wash sheets, blankets and drapes weekly in hot water (130? F) with detergent and dry them in a dryer on the hot setting. ? ?3. Have the room cleaned frequently with a vacuum cleaner and a damp dust-mop. For carpeting or rugs, vacuuming with a vacuum cleaner equipped with a high-efficiency particulate air (HEPA) filter. The dust mite allergic individual should not be in a room which is being cleaned and should wait 1 hour after cleaning before going into the room. ? ?4. Do not sleep on upholstered furniture (eg, couches). ? ?5. If possible removing carpeting, upholstered furniture and drapery from the home is ideal. Horizontal blinds should be eliminated in the rooms where the person spends the most time (bedroom, study, television room). Washable vinyl, roller-type shades are optimal. ? ?6. Remove all non-washable stuffed toys from the bedroom. Wash stuffed toys weekly like sheets and blankets above. ? ?7. Reduce indoor humidity to less than 50%. Inexpensive humidity monitors can be purchased at most hardware stores. Do not use a humidifier as can make the problem worse and are not recommended. ? ?Control of Cockroach Allergen ? ?Cockroach allergen has been identified as an important cause of acute attacks of asthma, especially in urban settings.  There are fifty-five species of cockroach that exist in the [place], however only three, the [place], [place] species produce allergen that can affect patients with Asthma.  Allergens can be obtained from fecal particles, egg casings and secretions from cockroaches. ?   ?Remove food sources. ?Reduce access to water. ?Seal access and entry points. ?Spray runways with 0.5-1% Diazinon or Chlorpyrifos ?Blow boric acid power under stoves and refrigerator. ?Place bait stations (hydramethylnon) at feeding sites. ? ? ?Lifestyle Changes for Controlling GERD ?When you have GERD,  stomach acid feels as if it?s backing up toward your mouth. ?Whether or not you take medication to control your GERD, your symptoms can often be ?improved with lifestyle changes.  ? ?Raise Your Head ?Reflux is more likely to strike when you?re lying down flat, because stomach fluid can ?flow backward more easily. Raising the head of your bed 4-6 inches can help. To do this: ?Slide blocks or books under the legs at the head of your bed. Or, place a wedge under ?the mattress. Many foam stores can make a suitable wedge for you. The wedge ?should run from your waist to the top of your head. ?Don?t just prop your head on several pillows. This increases pressure on your ?stomach. It can make GERD worse. ? ?Watch Your Eating Habits ?Certain foods may increase the acid in your stomach or relax the lower esophageal ?sphincter, making GERD more likely. It?s best to avoid the following: ?Coffee, tea, and carbonated drinks (with and without caffeine) ?Fatty, fried, or spicy food ?Mint, chocolate, onions, and tomatoes ?Any other foods that seem to irritate your stomach or cause you pain ? ?Relieve the Pressure ?Eat smaller meals, even if you have to eat more often. ?Don?t lie down right after you eat. Wait a  few hours for your stomach to empty. ?Avoid tight belts and tight-fitting clothes. ?Lose excess weight. ? ?Tobacco and Alcohol ?Avoid smoking tobacco and drinking alcohol. They can make GERD symptoms worse. ? ? ?Low FODMAP diet -- We suggest a diet low in fermentable oligo-, di-, and monosaccharides and polyols (FODMAPs) in patients with IBS with abdominal bloating or pain despite exclusion of gas-producing foods (table 1) [3]. These short-chain carbohydrates are poorly absorbed and are osmotically active in the intestinal lumen where they are rapidly fermented, resulting in symptoms of abdominal bloating and pain. A low FODMAP diet involves elimination of a larger number of high FODMAP foods that would not be excluded in a  diet that only required avoidance of gas-producing foods (eg, foods that contain fructose, including honey, high-fructose corn syrup, apples, pears, mangoes, cherries, or oligosaccharides, including wheat).

## 2021-07-19 ENCOUNTER — Other Ambulatory Visit: Payer: Self-pay

## 2021-07-19 MED ORDER — ESOMEPRAZOLE MAGNESIUM 40 MG PO CPDR: 40.0000 mg | DELAYED_RELEASE_CAPSULE | Freq: Every day | ORAL | 1 refills | Status: DC

## 2021-07-20 LAB — ALLERGEN PROFILE, BASIC FOOD
Allergen Corn, IgE: <0.1 kU/L
Beef IgE: <0.1 kU/L
Chocolate/Cacao IgE: <0.1 kU/L
Egg, Whole IgE: <0.1 kU/L
Food Mix (Seafoods) IgE: NEGATIVE
Milk IgE: <0.1 kU/L
Peanut IgE: <0.1 kU/L
Pork IgE: <0.1 kU/L
Soybean IgE: <0.1 kU/L
Wheat IgE: <0.1 kU/L

## 2021-07-20 LAB — ALLERGEN, RICE, F9: Allergen Rice IgE: <0.1 kU/L

## 2021-07-20 NOTE — Progress Notes (Signed)
Please let [name] know -  ? ?Her food allergy panel and rice were all negative.   ?This tested for milk, wheat, corn, peanut, soy, pork, beef, seafoods, egg and chocolate as well as rice. ? ?She has had multiple negative food allergy tests, so I don't think the symptoms she is experiencing are related to food allergies.  I would continue to seek therapeutic options with her GI specialist.   ? ?Please let me know if she has any questions.  Thanks

## 2021-07-31 ENCOUNTER — Emergency Department (HOSPITAL_COMMUNITY): Payer: Commercial Managed Care - HMO

## 2021-07-31 ENCOUNTER — Emergency Department (HOSPITAL_COMMUNITY)
Admission: EM | Admit: 2021-07-31 | Discharge: 2021-07-31 | Disposition: A | Discharge status: Home / Self Care | Payer: Commercial Managed Care - HMO | Attending: Emergency Medicine | Admitting: Emergency Medicine

## 2021-07-31 ENCOUNTER — Encounter (HOSPITAL_COMMUNITY): Payer: Self-pay

## 2021-07-31 ENCOUNTER — Other Ambulatory Visit: Payer: Self-pay

## 2021-07-31 DIAGNOSIS — Z79899 Other long term (current) drug therapy: Secondary | ICD-10-CM | POA: Insufficient documentation

## 2021-07-31 DIAGNOSIS — R600 Localized edema: Secondary | ICD-10-CM | POA: Insufficient documentation

## 2021-07-31 DIAGNOSIS — R1310 Dysphagia, unspecified: Secondary | ICD-10-CM | POA: Diagnosis not present

## 2021-07-31 DIAGNOSIS — Z20822 Contact with and (suspected) exposure to covid-19: Secondary | ICD-10-CM | POA: Insufficient documentation

## 2021-07-31 DIAGNOSIS — R112 Nausea with vomiting, unspecified: Secondary | ICD-10-CM | POA: Diagnosis present

## 2021-07-31 DIAGNOSIS — R197 Diarrhea, unspecified: Secondary | ICD-10-CM | POA: Insufficient documentation

## 2021-07-31 DIAGNOSIS — R1084 Generalized abdominal pain: Secondary | ICD-10-CM | POA: Insufficient documentation

## 2021-07-31 DIAGNOSIS — D649 Anemia, unspecified: Secondary | ICD-10-CM | POA: Insufficient documentation

## 2021-07-31 DIAGNOSIS — F1110 Opioid abuse, uncomplicated: Secondary | ICD-10-CM | POA: Diagnosis not present

## 2021-07-31 LAB — RAPID URINE DRUG SCREEN, HOSP PERFORMED
Amphetamines: NOT DETECTED
Barbiturates: NOT DETECTED
Benzodiazepines: NOT DETECTED
Cocaine: NOT DETECTED
Opiates: POSITIVE — AB
Tetrahydrocannabinol: NOT DETECTED

## 2021-07-31 LAB — BRAIN NATRIURETIC PEPTIDE: B Natriuretic Peptide: 28.8 pg/mL (ref 0.0–100.0)

## 2021-07-31 LAB — COMPREHENSIVE METABOLIC PANEL
ALT: 21 U/L (ref 0–44)
AST: 22 U/L (ref 15–41)
Albumin: 3.9 g/dL (ref 3.5–5.0)
Alkaline Phosphatase: 55 U/L (ref 38–126)
Anion gap: 5 (ref 5–15)
BUN: 9 mg/dL (ref 6–20)
CO2: 28 mmol/L (ref 22–32)
Calcium: 9.2 mg/dL (ref 8.9–10.3)
Chloride: 105 mmol/L (ref 98–111)
Creatinine, Ser: 0.8 mg/dL (ref 0.44–1.00)
GFR, Estimated: >60 mL/min (ref >60)
Glucose, Bld: 101 mg/dL — ABNORMAL HIGH (ref 70–99)
Potassium: 3.7 mmol/L (ref 3.5–5.1)
Sodium: 138 mmol/L (ref 135–145)
Total Bilirubin: 0.2 mg/dL — ABNORMAL LOW (ref 0.3–1.2)
Total Protein: 7.7 g/dL (ref 6.5–8.1)

## 2021-07-31 LAB — CBC WITH DIFFERENTIAL/PLATELET
Abs Immature Granulocytes: 0 10*3/uL (ref 0.00–0.07)
Basophils Absolute: 0 10*3/uL (ref 0.0–0.1)
Basophils Relative: 1 %
Eosinophils Absolute: 0.1 10*3/uL (ref 0.0–0.5)
Eosinophils Relative: 2 %
HCT: 32 % — ABNORMAL LOW (ref 36.0–46.0)
Hemoglobin: 9.8 g/dL — ABNORMAL LOW (ref 12.0–15.0)
Immature Granulocytes: 0 %
Lymphocytes Relative: 43 %
Lymphs Abs: 1.9 10*3/uL (ref 0.7–4.0)
MCH: 24.6 pg — ABNORMAL LOW (ref 26.0–34.0)
MCHC: 30.6 g/dL (ref 30.0–36.0)
MCV: 80.2 fL (ref 80.0–100.0)
Monocytes Absolute: 0.3 10*3/uL (ref 0.1–1.0)
Monocytes Relative: 7 %
Neutro Abs: 2.1 10*3/uL (ref 1.7–7.7)
Neutrophils Relative %: 47 %
Platelets: 280 10*3/uL (ref 150–400)
RBC: 3.99 MIL/uL (ref 3.87–5.11)
RDW: 13.7 % (ref 11.5–15.5)
WBC: 4.4 10*3/uL (ref 4.0–10.5)
nRBC: 0 % (ref 0.0–0.2)

## 2021-07-31 LAB — LIPASE, BLOOD: Lipase: 31 U/L (ref 11–51)

## 2021-07-31 LAB — TROPONIN I (HIGH SENSITIVITY): Troponin I (High Sensitivity): <2 ng/L (ref <18)

## 2021-07-31 LAB — RESP PANEL BY RT-PCR (FLU A&B, COVID) ARPGX2
Influenza A by PCR: NEGATIVE
Influenza B by PCR: NEGATIVE
SARS Coronavirus 2 by RT PCR: NEGATIVE

## 2021-07-31 LAB — D-DIMER, QUANTITATIVE: D-Dimer, Quant: <0.27 ug/mL-FEU (ref 0.00–0.50)

## 2021-07-31 LAB — MAGNESIUM: Magnesium: 2.2 mg/dL (ref 1.7–2.4)

## 2021-07-31 LAB — CBG MONITORING, ED: Glucose-Capillary: 93 mg/dL (ref 70–99)

## 2021-07-31 MED ORDER — DROPERIDOL 2.5 MG/ML IJ SOLN
2.5000 mg | Freq: Once | INTRAMUSCULAR | Status: AC
  Administered 2021-07-31: 2.5 mg via INTRAVENOUS
  Filled 2021-07-31: qty 2

## 2021-07-31 MED ORDER — LACTATED RINGERS IV BOLUS
1000.0000 mL | Freq: Once | INTRAVENOUS | Status: AC
Start: 2021-07-31 — End: 2021-07-31
  Administered 2021-07-31: 1000 mL via INTRAVENOUS

## 2021-07-31 MED ORDER — IOHEXOL 300 MG/ML  SOLN
100.0000 mL | Freq: Once | INTRAMUSCULAR | Status: AC | PRN
  Administered 2021-07-31: 100 mL via INTRAVENOUS

## 2021-07-31 MED ORDER — ALUM & MAG HYDROXIDE-SIMETH 200-200-20 MG/5ML PO SUSP
30.0000 mL | Freq: Once | ORAL | Status: AC
  Administered 2021-07-31: 30 mL via ORAL
  Filled 2021-07-31: qty 30

## 2021-07-31 MED ORDER — LIDOCAINE VISCOUS HCL 2 % MT SOLN
15.0000 mL | Freq: Once | OROMUCOSAL | Status: AC
  Administered 2021-07-31: 15 mL via ORAL
  Filled 2021-07-31: qty 15

## 2021-07-31 MED ORDER — FENTANYL CITRATE PF 50 MCG/ML IJ SOSY
50.0000 ug | PREFILLED_SYRINGE | Freq: Once | INTRAMUSCULAR | Status: AC
  Administered 2021-07-31: 50 ug via INTRAVENOUS
  Filled 2021-07-31: qty 1

## 2021-07-31 MED ORDER — ONDANSETRON HCL 4 MG/2ML IJ SOLN
4.0000 mg | Freq: Once | INTRAMUSCULAR | Status: AC
  Administered 2021-07-31: 4 mg via INTRAVENOUS
  Filled 2021-07-31: qty 2

## 2021-07-31 NOTE — ED Notes (Signed)
Ginger ale given to pt to evaluate tolerance at this time.  ?

## 2021-07-31 NOTE — ED Triage Notes (Addendum)
Pt presents to ED from home, states she has not been feeling well x 2 days and tonight developed N/V. Pt states her throat feels dry and she is unable to keep anything down.  ?

## 2021-07-31 NOTE — ED Notes (Signed)
Pt tolerated ginger ale well and was able to keep beverage down, no c/o nausea.  ?

## 2021-07-31 NOTE — ED Provider Notes (Signed)
?[facility]-EMERGENCY DEPT ?Provider Note ? ? ?CSN: [id number] ?Arrival date & time: 07/31/21  6301 ? ?  ? ?History ? ?Chief Complaint  ?Patient presents with  ? Emesis  ? Abdominal Pain  ? ? ?[name] is a 46 y.o. female. ? ? ?Emesis ?Associated symptoms: abdominal pain   ?Abdominal Pain ?Associated symptoms: chest pain, shortness of breath and vomiting   ? ? 46 year old female with a hx of GERD, anemia, constipation, endometriosis s/p hysterectomy, migraines, angioedema, morbid obesity who presents to the ED with several days of nausea and vomiting. She also endorses diarrhea that started yesterday. She endorses bilateral lower abdominal pain, pelvic pain, and chest pain. Her chest discomfort feels like a dull ache. She endorses palpitations and shortness of breath. She endorses dyspnea on exertion and chest pain and pressure on exertion. She endorses an 8lb weight gain over 2 weeks with bilateral lower ankle swelling. She denies and fevers, endorses mild chills, endorses a nighttime cough. Says she has to stack pillows up in order to sleep.  ? ?Her last echo was in 2021 with a normal EF 60-65% and no diastolic dysfunction. She had a reassuring coronary CT in 2021.  ? ?I reviewed the patient's last gastroenterology clinic visit on 07/11/2021.  She has a history of a small hiatal hernia on EGD last August 2022.  She has had chronic epigastric discomfort for the last 6 months and frequently gets nauseated.  She has had a work-up to include EGD, CT, gastric emptying study she had a positive gastric emptying study and was diagnosed with gastroparesis in 2011.  Gastric emptying study in 2019.  She has a history of cholecystectomy.  She is on Nexium 40 mg twice daily for reflux.  She also has chronic dysphagia which has been extensively worked up outpatient by gastroenterology. ? ?Home Medications ?Prior to Admission medications   ?Medication Sig Start Date End Date Taking? Authorizing Provider   ?acetaminophen (TYLENOL) 500 MG tablet Take 1,000 mg by mouth every 6 (six) hours as needed for moderate pain.    [provider]  ?albuterol (VENTOLIN HFA) 108 (90 Base) MCG/ACT inhaler TAKE 2 PUFFS BY MOUTH EVERY 6 HOURS AS NEEDED FOR WHEEZE OR SHORTNESS OF BREATH 02/04/21   Charlott Holler, MD  ?calcium carbonate (OS-CAL - DOSED IN MG OF ELEMENTAL CALCIUM) 1250 (500 Ca) MG tablet Take 1 tablet by mouth.    [provider]  ?Caraway Oil-Levomenthol (FDGARD) 25-20.75 MG CAPS Take as directed ?Patient not taking: Reported on 07/18/2021 07/11/21   Benancio Deeds, MD  ?cetirizine (ZYRTEC) 10 MG tablet TAKE 1 TABLET (10 MG TOTAL) BY MOUTH DAILY AS NEEDED FOR RHINITIS. 06/25/21   Tonny Bollman, MD  ?clindamycin (CLEOCIN T) 1 % lotion Apply 1 application topically daily.    [provider]  ?EPINEPHrine (EPIPEN 2-PAK) 0.3 mg/0.3 mL IJ SOAJ injection Inject 0.3 mg into the muscle as needed for anaphylaxis. 03/21/21   Tonny Bollman, MD  ?esomeprazole (NEXIUM) 40 MG capsule Take 1 capsule (40 mg total) by mouth daily. 07/19/21   Armbruster, Willaim Rayas, MD  ?famotidine (PEPCID) 20 MG tablet Take 20 mg by mouth daily. 01/22/20   [provider]  ?fluticasone furoate-vilanterol (BREO ELLIPTA) 100-25 MCG/INH AEPB Inhale 1 puff into the lungs daily as needed. 12/18/20   Glenford Bayley, NP  ?HYDROcodone-acetaminophen (NORCO) 7.5-325 MG tablet Take 1 tablet by mouth every 8 (eight) hours as needed. 07/12/21   [provider]  ?  ondansetron (ZOFRAN) 4 MG tablet Take 1 tablet (4 mg total) by mouth daily as needed for nausea or vomiting. 04/23/21 04/23/22  Arnette FeltsMoore, Janece, FNP  ?RESTASIS 0.05 % ophthalmic emulsion Place 1 drop into both eyes 2 (two) times daily. 11/04/19   [provider]  ?tretinoin (RETIN-A) 0.025 % cream tretinoin 0.025 % topical cream ? APPLY APPLICATIONS APPLY A PEA-SIZED AMOUNT TO FACE AT BEDTIME    [provider]  ?Vitamin D, Ergocalciferol, (DRISDOL)  1.25 MG (50000 UNIT) CAPS capsule Take 1 capsule (50,000 Units total) by mouth every 7 (seven) days. 10/25/20   Arnette FeltsMoore, Janece, FNP  ?   ? ?Allergies    ?Aspirin, Ibuprofen, Salmon [fish allergy], and Zoledronic acid   ? ?Review of Systems   ?Review of Systems  ?Respiratory:  Positive for shortness of breath.   ?Cardiovascular:  Positive for chest pain, palpitations and leg swelling.  ?Gastrointestinal:  Positive for abdominal pain and vomiting.  ?All other systems reviewed and are negative. ? ?Physical Exam ?Updated Vital Signs ?BP 126/82   Pulse 60   Temp 97.8 ?F (36.6 ?C) (Oral)   Resp 12   Ht 5\' 4"  (1.626 m)   Wt 105 kg   LMP 12/27/2003   SpO2 100%   BMI 39.73 kg/m?  ?Physical Exam ?Vitals and nursing note reviewed.  ?Constitutional:   ?   General: She is not in acute distress. ?   Appearance: She is well-developed. She is obese. She is not ill-appearing.  ?HENT:  ?   Head: Normocephalic and atraumatic.  ?Eyes:  ?   Conjunctiva/sclera: Conjunctivae normal.  ?   Pupils: Pupils are equal, round, and reactive to light.  ?Cardiovascular:  ?   Rate and Rhythm: Normal rate and regular rhythm.  ?   Heart sounds: No murmur heard. ?Pulmonary:  ?   Effort: Pulmonary effort is normal. No respiratory distress.  ?   Breath sounds: Normal breath sounds.  ?Abdominal:  ?   General: There is no distension.  ?   Palpations: Abdomen is soft.  ?   Tenderness: There is generalized abdominal tenderness. There is no guarding or rebound.  ?Musculoskeletal:     ?   General: No swelling, deformity or signs of injury.  ?   Cervical back: Neck supple.  ?   Right lower leg: Edema present.  ?   Left lower leg: Edema present.  ?   Comments: Trace bilateral pitting edema  ?Skin: ?   General: Skin is warm and dry.  ?   Capillary Refill: Capillary refill takes less than 2 seconds.  ?   Findings: No lesion or rash.  ?Neurological:  ?   General: No focal deficit present.  ?   Mental Status: She is alert. Mental status is at baseline.   ?Psychiatric:     ?   Mood and Affect: Mood normal.  ? ? ?ED Results / Procedures / Treatments   ?Labs ?(all labs ordered are listed, but only abnormal results are displayed) ?Labs Reviewed  ?COMPREHENSIVE METABOLIC PANEL - Abnormal; Notable for the following components:  ?    Result Value  ? Glucose, Bld 101 (*)   ? Total Bilirubin 0.2 (*)   ? All other components within normal limits  ?RAPID URINE DRUG SCREEN, HOSP PERFORMED - Abnormal; Notable for the following components:  ? Opiates POSITIVE (*)   ? All other components within normal limits  ?CBC WITH DIFFERENTIAL/PLATELET - Abnormal; Notable for the following components:  ? Hemoglobin  9.8 (*)   ? HCT 32.0 (*)   ? MCH 24.6 (*)   ? All other components within normal limits  ?RESP PANEL BY RT-PCR (FLU A&B, COVID) ARPGX2  ?MAGNESIUM  ?LIPASE, BLOOD  ?BRAIN NATRIURETIC PEPTIDE  ?D-DIMER, QUANTITATIVE  ?CBG MONITORING, ED  ?TROPONIN I (HIGH SENSITIVITY)  ? ? ?EKG ?EKG Interpretation ? ?Date/Time:  Tuesday July 31 2021 08:32:04 EDT ?Ventricular Rate:  61 ?PR Interval:  160 ?QRS Duration: 101 ?QT Interval:  434 ?QTC Calculation: 438 ?R Axis:   119 ?Text Interpretation: Normal sinus rhythm Confirmed by Ernie Avena ([id number]) on 07/31/2021 8:34:37 AM ? ?Radiology ?CT ABDOMEN PELVIS W CONTRAST ? ?Result Date: 07/31/2021 ?CLINICAL DATA:  Acute generalized abdominal pain. EXAM: CT ABDOMEN AND PELVIS WITH CONTRAST TECHNIQUE: Multidetector CT imaging of the abdomen and pelvis was performed using the standard protocol following bolus administration of intravenous contrast. RADIATION DOSE REDUCTION: This exam was performed according to the departmental dose-optimization program which includes automated exposure control, adjustment of the mA and/or kV according to patient size and/or use of iterative reconstruction technique. CONTRAST:  OMNIPAQUE IOHEXOL 300 MG/ML  SOLN COMPARISON:  October 05, 2020. FINDINGS: Lower chest: No acute abnormality. Hepatobiliary: No focal liver  abnormality is seen. Status post cholecystectomy. No biliary dilatation. Pancreas: Unremarkable. No pancreatic ductal dilatation or surrounding inflammatory changes. Spleen: Normal in size without focal abnormality. Dimas [place]

## 2021-07-31 NOTE — ED Notes (Signed)
Patient ambulated to RR w/o assistance, is attempting to provide urine sample.  ?

## 2021-07-31 NOTE — ED Notes (Signed)
Pt unable to keep any of the fluids down, spit everything up in the cup ? ?

## 2021-07-31 NOTE — ED Notes (Signed)
Pt given another cup of ginger ale.  ?

## 2021-07-31 NOTE — ED Notes (Signed)
Unsuccessful IV attempt in R forearm. 22g. ?

## 2021-08-01 ENCOUNTER — Telehealth: Payer: Self-pay

## 2021-08-01 NOTE — Telephone Encounter (Signed)
Error

## 2021-08-02 ENCOUNTER — Telehealth: Payer: Self-pay

## 2021-08-02 NOTE — Telephone Encounter (Signed)
Transition Care Management Follow-up Telephone Call ?Date of discharge and from where: 07/31/2021 , [place]  ?How have you been since you were released from the hospital? Pt states she is okay , but she still does not feel good.  ?Any questions or concerns? No ? ?Items Reviewed: ?Did the pt receive and understand the discYes harge instructions provided? Yes  ?Medications obtained and verified?  ?Other? Yes  ?Any new allergies since your discharge? No  ?Dietary orders reviewed? Yes ?Do you have support at home? Yes  ? ?Home Care and Equipment/Supplies: ?Were home health services ordered? no ?If so, what is the name of the agency? N/a  ?Has the agency set up a time to come to the patient's home? no ?Were any new equipment or medical supplies ordered?  No ?What is the name of the medical supply agency? N/a ?Were you able to get the supplies/equipment? no ?Do you have any questions related to the use of the equipment or supplies? No ? ?Functional Questionnaire: (I = Independent and D = Dependent) ?ADLs: i ? ?Bathing/Dressing- i ? ?Meal Prep- i ? ?Eating- i ? ?Maintaining continence- i ? ?Transferring/Ambulation- i ? ?Managing Meds- i ? ?Follow up appointments reviewed: ? ?PCP Hospital f/u appt confirmed? No  Scheduled to see n/a on n/a @ n/a. ?Specialist Hospital f/u appt confirmed? No  Scheduled to see n/a on n/a @ n/a. ?Are transportation arrangements needed? No  ?If their condition worsens, is the pt aware to call PCP or go to the Emergency Dept.? Yes ?Was the patient provided with contact information for the PCP's office or ED? Yes ?Was to pt encouraged to call back with questions or concerns? Yes  ?

## 2021-08-14 ENCOUNTER — Encounter (HOSPITAL_BASED_OUTPATIENT_CLINIC_OR_DEPARTMENT_OTHER): Payer: Self-pay | Admitting: Nurse Practitioner

## 2021-08-14 ENCOUNTER — Ambulatory Visit (INDEPENDENT_AMBULATORY_CARE_PROVIDER_SITE_OTHER): Payer: Managed Care, Other (non HMO) | Admitting: Nurse Practitioner

## 2021-08-14 DIAGNOSIS — R0681 Apnea, not elsewhere classified: Secondary | ICD-10-CM

## 2021-08-14 DIAGNOSIS — R1013 Epigastric pain: Secondary | ICD-10-CM | POA: Diagnosis not present

## 2021-08-14 DIAGNOSIS — R002 Palpitations: Secondary | ICD-10-CM | POA: Diagnosis not present

## 2021-08-14 DIAGNOSIS — G8929 Other chronic pain: Secondary | ICD-10-CM

## 2021-08-14 NOTE — Patient Instructions (Signed)
Medication Instructions:  ?Your Physician recommend you continue on your current medication as directed.   ? ?*If you need a refill on your cardiac medications before your next appointment, please call your pharmacy* ? ?Follow-Up: ?At [facility], you and your health needs are our priority.  As part of our continuing mission to provide you with exceptional heart care, we have created designated Provider Care Teams.  These Care Teams include your primary Cardiologist (physician) and Advanced Practice Providers (APPs -  Physician Assistants and Nurse Practitioners) who all work together to provide you with the care you need, when you need it. ? ?We recommend signing up for the patient portal called "MyChart".  Sign up information is provided on this After Visit Summary.  MyChart is used to connect with patients for Virtual Visits (Telemedicine).  Patients are able to view lab/test results, encounter notes, upcoming appointments, etc.  Non-urgent messages can be sent to your provider as well.   ?To learn more about what you can do with MyChart, go to [email or web address].   ? ?Your next appointment:   ?1 year(s) ? ?The format for your next appointment:   ?In Person ? ?Provider:   ?Tiffany Lincroft, MD{ ? ?Important Information About Sugar ? ? ? ? ? ? ?

## 2021-08-14 NOTE — Progress Notes (Signed)
? ?Office Visit  ?  ?Patient Name: [name] ?Date of Encounter: 08/14/2021 ? ?PCP:  [name], FNP ?  ?[facility]  ?Cardiologist:  Chilton Si, MD  ?Advanced Practice Provider:  No care team member to display ?Electrophysiologist:  None  ?   ? ?Chief Complaint  ?  ?[name] is a 46 y.o. female with a hx of chronic pain, atypical chest pain with coronary artery calcium score of 0, vitamin D deficiency, GERD, allergic rhinitis  presents today for cardiology follow-up ? ? ?Past Medical History  ?  ?Past Medical History:  ?Diagnosis Date  ? AKI (acute kidney injury) (HCC) 10/06/2020  ? Allergy   ? Anemia   ? Angio-edema   ? Anxiety   ? Apnea 12/08/2019  ? Arthritis   ? Asthma   ? Back pain   ? Constipation   ? Exertional dyspnea 03/14/2020  ? Fatty liver   ? GERD (gastroesophageal reflux disease)   ? H/O: hysterectomy   ? Hx of endometriosis   ? Inappropriate sinus tachycardia 12/08/2019  ? Insomnia   ? Joint pain   ? Lactose intolerance   ? Macromastia 07/2011  ? Migraine   ? Morbid obesity (HCC) 12/08/2019  ? Osteoporosis   ? Urticaria   ? Vitamin D deficiency   ? ?Past Surgical History:  ?Procedure Laterality Date  ? 58 HOUR PH STUDY N/A 01/12/2018  ? Procedure: 24 HOUR PH STUDY;  Surgeon: Napoleon Form, MD;  Location: [facility];  Service: Endoscopy;  Laterality: N/A;  ? ABDOMINAL HYSTERECTOMY  12/12/2003  ? ADENOIDECTOMY  05/2011  ? ANKLE ARTHROSCOPY  12/29/2007  ? right; with extensive debridement  ? BLADDER NECK RECONSTRUCTION  01/14/2011  ? Procedure: BLADDER NECK REPAIR;  Surgeon: Reva Bores, MD;  Location: [facility];  Service: Gynecology;  Laterality: N/A;  Laparoscopic Repair of Incidental Cystotomy  ? BREAST REDUCTION SURGERY  08/05/2011  ? Procedure: MAMMARY REDUCTION  (BREAST);  Surgeon: Louisa Second, MD;  Location: [facility];  Service: Plastics;  Laterality: Bilateral;  bilateral  ? BREAST SURGERY  2012  ? breast reduction  ? CESAREAN SECTION   12/29/2000; 1994  ? DILATION AND CURETTAGE OF UTERUS  07/08/2003  ? open laparoscopy  ? ESOPHAGEAL MANOMETRY N/A 01/12/2018  ? Procedure: ESOPHAGEAL MANOMETRY (EM);  Surgeon: Napoleon Form, MD;  Location: [facility];  Service: Endoscopy;  Laterality: N/A;  ? GIVENS CAPSULE STUDY N/A 06/23/2017  ? Procedure: GIVENS CAPSULE STUDY;  Surgeon: Benancio Deeds, MD;  Location: [facility];  Service: Gastroenterology;  Laterality: N/A;  ? LAPAROSCOPIC SALPINGOOPHERECTOMY  01/14/2011  ? left  ? PH IMPEDANCE STUDY N/A 01/12/2018  ? Procedure: PH IMPEDANCE STUDY;  Surgeon: Napoleon Form, MD;  Location: [facility];  Service: Endoscopy;  Laterality: N/A;  ? REPAIR PERONEAL TENDONS ANKLE  01/03/2010  ? repair right subluxing peroneal tendons  ? RESECTION DISTAL CLAVICAL Right 09/25/2017  ? Procedure: RESECTION DISTAL CLAVICAL;  Surgeon: Bjorn Pippin, MD;  Location: [facility];  Service: Orthopedics;  Laterality: Right;  ? RIGHT OOPHORECTOMY    ? with lysis of adhesions  ? SHOULDER ACROMIOPLASTY Right 09/25/2017  ? Procedure: SHOULDER ACROMIOPLASTY;  Surgeon: Bjorn Pippin, MD;  Location: [facility];  Service: Orthopedics;  Laterality: Right;  ? SHOULDER ARTHROSCOPY WITH DEBRIDEMENT AND BICEP TENDON REPAIR Right 09/25/2017  ? Procedure: SHOULDER ARTHROSCOPY WITH DEBRIDEMENT AND BICEP TENDON REPAIR;  Surgeon: Ramond Marrow  T, MD;  Location: [facility];  Service: Orthopedics;  Laterality: Right;  ? SHOULDER ARTHROSCOPY WITH ROTATOR CUFF REPAIR Right 09/25/2017  ? Procedure: SHOULDER ARTHROSCOPY WITH ROTATOR CUFF REPAIR;  Surgeon: Bjorn PippinVarkey, Dax T, MD;  Location: [facility];  Service: Orthopedics;  Laterality: Right;  ? TONSILLECTOMY  as a child  ? TUBAL LIGATION  12/29/2000  ? UPPER GASTROINTESTINAL ENDOSCOPY    ? ? ?Allergies ? ?Allergies  ?Allergen Reactions  ? Aspirin   ? Ibuprofen   ? Carmie KannerSalmon [Fish Allergy]   ? Zoledronic Acid Other (See Comments)  ? ? ?History of  Present Illness  ?  ?[name] is a 46 y.o. female with a hx of chronic pain, atypical chest pain with coronary artery calcium score of 0, vitamin D deficiency, GERD, allergic rhinitis last seen 03/14/2020. ? ?History of chest pain dates back to September 2018 with multiple ED visits.  EKGs have been negative for ischemia showing sinus rhythm and sinus tachycardia.  ETT 01/2017 negative for ischemia and she achieved 11 METS on Bruce protocol.  Pulmonary function testing November 2018 unremarkable.  Cardiac CTA 10/2019 performed for chest pain with coronary calcium score of 0.  Cardiac monitor July 2021 showed predominantly normal sinus rhythm with rare PVC.  No significant arrhythmia noted.  Previously on metoprolol for palpitations but she discontinued as it made her feel tired.  Echocardiogram in December 2021 ordered due to dyspnea revealed normal LVEF 60 to 65%, no RWMA, normal RV size and function, no significant valvular abnormalities. ? ?When last seen by Dr. Duke Salviaandolph 03/2020 she noted 2531-month history of pain in right in ER and was recommended to follow-up with PCP, eye doctor who thought it was nerve related.  She had increased palpitations in the setting of taking prednisone but this improved with dose reduced.  Sleep study showed moderate OSA during REM sleep but did not meet criteria for CPAP and recommended for lifestyle changes. ? ?She had ED visit 07/31/21 with nausea, vomiting, diarrhea, abdominal pain, pelvic pain, chest pain, palpitations, shortness of breath.  Known small hiatal hernia.  She was following closely with gastroenterology.  In the ED she was provided IV Zofran, IVF, IV fentanyl, viscous lidocaine, Maalox, IV deoperidol and recommended to follow up with GI. CXR, CT abd/pelvis were unremarkable. ? ?She presents today for follow-up.  She reports that she is feeling better and has no current complaints of palpitations, abdominal pain, or emesis. She does endorse some chest pain and  shortness of breath that occurred last week after drinking 5-6 bottles of water.  She also describes some swelling in the lower extremities that resolves with elevation.  She is currently walking for exercise and is abstaining from excess sodium in her diet.  We discussed the importance of reporting any additional chest discomfort or shortness of breath and if intractable to call 911. ? ?EKGs/Labs/Other Studies Reviewed:  ? ?The following studies were reviewed today: ? ?Cardiac CTA 10/2019 ?IMPRESSION: ?1. Coronary artery calcium score 0 Agatston units, suggesting low ?risk for future cardiac events. ?  ?2.  No significant coronary disease noted. ? ?Cardiac monitoring 11/2019 ?Sinus bradycardia, normal sinus rhythm, sinus tachycardia, rare PVC  ? ?Echo 04/2020 ? ? 1. Left ventricular ejection fraction, by estimation, is 60 to 65%. The  ?left ventricle has normal function. The left ventricle has no regional  ?wall motion abnormalities. Left ventricular diastolic parameters were  ?normal.  ? 2. Right ventricular systolic function is normal. The  right ventricular  ?size is mildly enlarged. There is normal pulmonary artery systolic  ?pressure. The estimated right ventricular systolic pressure is 23.8 mmHg.  ? 3. Right atrial size was mildly dilated.  ? 4. The mitral valve is normal in structure. No evidence of mitral valve  ?regurgitation. No evidence of mitral stenosis.  ? 5. The aortic valve is normal in structure. Aortic valve regurgitation is  ?not visualized. No aortic stenosis is present.  ? 6. The inferior vena cava is normal in size with greater than 50%  ?respiratory variability, suggesting right atrial pressure of 3 mmHg.  ? ?EKG:  EKG is  ordered today.  The ekg ordered today demonstrates none completed today ? ?Recent Labs: ?07/31/2021: ALT 21; B Natriuretic Peptide 28.8; BUN 9; Creatinine, Ser 0.80; Hemoglobin 9.8; Magnesium 2.2; Platelets 280; Potassium 3.7; Sodium 138  ?Recent Lipid Panel ?   ?Component  Value Date/Time  ? CHOL 212 (H) 04/23/2021 1549  ? TRIG 78 04/23/2021 1549  ? HDL 64 04/23/2021 1549  ? CHOLHDL 3.3 04/23/2021 1549  ? CHOLHDL 3.2 12/06/2015 1153  ? VLDL 13 12/06/2015 1153  ? LDLCALC 134 (H) 12/19

## 2021-08-15 ENCOUNTER — Encounter: Payer: Self-pay | Admitting: Nurse Practitioner

## 2021-08-21 ENCOUNTER — Ambulatory Visit (HOSPITAL_BASED_OUTPATIENT_CLINIC_OR_DEPARTMENT_OTHER): Payer: Managed Care, Other (non HMO) | Admitting: Family

## 2021-08-28 ENCOUNTER — Encounter: Payer: Self-pay | Admitting: Nurse Practitioner

## 2021-08-28 ENCOUNTER — Telehealth (INDEPENDENT_AMBULATORY_CARE_PROVIDER_SITE_OTHER): Payer: Managed Care, Other (non HMO) | Admitting: Nurse Practitioner

## 2021-08-28 DIAGNOSIS — E782 Mixed hyperlipidemia: Secondary | ICD-10-CM | POA: Diagnosis not present

## 2021-08-28 DIAGNOSIS — R7309 Other abnormal glucose: Secondary | ICD-10-CM

## 2021-08-28 DIAGNOSIS — M542 Cervicalgia: Secondary | ICD-10-CM

## 2021-08-28 DIAGNOSIS — Z6841 Body Mass Index (BMI) 40.0 and over, adult: Secondary | ICD-10-CM

## 2021-08-28 DIAGNOSIS — E559 Vitamin D deficiency, unspecified: Secondary | ICD-10-CM

## 2021-08-28 MED ORDER — WEGOVY 0.25 MG/0.5ML ~~LOC~~ SOAJ: 0.2500 mg | SUBCUTANEOUS | 0 refills | Status: DC

## 2021-08-28 NOTE — Progress Notes (Signed)
? ?Virtual Visit via MyChart  ? ?This visit type was conducted due to national recommendations for restrictions regarding the COVID-19 Pandemic (e.g. social distancing) in an effort to limit this patient's exposure and mitigate transmission in our community.  Due to her co-morbid illnesses, this patient is at least at moderate risk for complications without adequate follow up.  This format is felt to be most appropriate for this patient at this time.  All issues noted in this document were discussed and addressed.  A limited physical exam was performed with this format.   ? ?This visit type was conducted due to national recommendations for restrictions regarding the COVID-19 Pandemic (e.g. social distancing) in an effort to limit this patient's exposure and mitigate transmission in our community.  Patients identity confirmed using two different identifiers.  This format is felt to be most appropriate for this patient at this time.  All issues noted in this document were discussed and addressed.  No physical exam was performed (except for noted visual exam findings with Video Visits).   ? ?Date:  09/07/2021  ? ?ID:  [name], DOB [birth year], MRN [id number] ? ?Patient Location:  ?Home ? ?Provider location:   ?Office ? ? ? ?Chief Complaint:  ER follow up from nausea/vomiting ? ?History of Present Illness:   ? ?[name] is a 46 y.o. female who presents via video conferencing for a telehealth visit today.   ? ?The patient does not have symptoms concerning for COVID-19 infection (fever, chills, cough, or new shortness of breath).  ? ?Pt presents for ED f/u, she visited on 07/31/2021 for Emesis & abdominal pain. As of now, she reports feeling okay. And she went to her heart specialist this Thursday. She has also been to her pulmologist. She is better at this time.  ? ?She would like a better weight loss plan, she would like to try a medication.  ? ?She reports having problems with her weight - she has been to  wellness centers - healthy weight and wellness - she was having weight loss, but she stopped going. She was referred to [place] [facility].  She needs medications to curve her appetite. She walks approximately 3-4 times a week. She is not doing any strength training.  She has taken phentermine in the past.  ?  ? ?Past Medical History:  ?Diagnosis Date  ? AKI (acute kidney injury) (HCC) 10/06/2020  ? Allergy   ? Anemia   ? Angio-edema   ? Anxiety   ? Apnea 12/08/2019  ? Arthritis   ? Asthma   ? Back pain   ? Constipation   ? Exertional dyspnea 03/14/2020  ? Fatty liver   ? GERD (gastroesophageal reflux disease)   ? H/O: hysterectomy   ? Hx of endometriosis   ? Inappropriate sinus tachycardia 12/08/2019  ? Insomnia   ? Joint pain   ? Lactose intolerance   ? Macromastia 07/2011  ? Migraine   ? Morbid obesity (HCC) 12/08/2019  ? Osteoporosis   ? Urticaria   ? Vitamin D deficiency   ? ?Past Surgical History:  ?Procedure Laterality Date  ? 51 HOUR PH STUDY N/A 01/12/2018  ? Procedure: 24 HOUR PH STUDY;  Surgeon: Napoleon Form, MD;  Location: [facility];  Service: Endoscopy;  Laterality: N/A;  ? ABDOMINAL HYSTERECTOMY  12/12/2003  ? ADENOIDECTOMY  05/2011  ? ANKLE ARTHROSCOPY  12/29/2007  ? right; with extensive debridement  ? BLADDER NECK RECONSTRUCTION  01/14/2011  ? Procedure:  BLADDER NECK REPAIR;  Surgeon: Reva Boresanya S Pratt, MD;  Location: [facility];  Service: Gynecology;  Laterality: N/A;  Laparoscopic Repair of Incidental Cystotomy  ? BREAST REDUCTION SURGERY  08/05/2011  ? Procedure: MAMMARY REDUCTION  (BREAST);  Surgeon: Louisa SecondGerald Truesdale, MD;  Location: [facility];  Service: Plastics;  Laterality: Bilateral;  bilateral  ? BREAST SURGERY  2012  ? breast reduction  ? CESAREAN SECTION  12/29/2000; 1994  ? DILATION AND CURETTAGE OF UTERUS  07/08/2003  ? open laparoscopy  ? ESOPHAGEAL MANOMETRY N/A 01/12/2018  ? Procedure: ESOPHAGEAL MANOMETRY (EM);  Surgeon: Napoleon FormNandigam, Kavitha V, MD;  [facility];  Service:  Endoscopy;  Laterality: N/A;  ? GIVENS CAPSULE STUDY N/A 06/23/2017  ? Procedure: GIVENS CAPSULE STUDY;  Surgeon: Benancio DeedsArmbruster, Steven P, MD;  [facility];  Service: Gastroenterology;  Laterality: N/A;  ? LAPAROSCOPIC SALPINGOOPHERECTOMY  01/14/2011  ? left  ? PH IMPEDANCE STUDY N/A 01/12/2018  ? Procedure: PH IMPEDANCE STUDY;  Surgeon: Napoleon FormNandigam, Kavitha V, MD;  [facility];  Service: Endoscopy;  Laterality: N/A;  ? REPAIR PERONEAL TENDONS ANKLE  01/03/2010  ? repair right subluxing peroneal tendons  ? RESECTION DISTAL CLAVICAL Right 09/25/2017  ? Procedure: RESECTION DISTAL CLAVICAL;  Surgeon: Bjorn PippinVarkey, Dax T, MD;  Location: [facility];  Service: Orthopedics;  Laterality: Right;  ? RIGHT OOPHORECTOMY    ? with lysis of adhesions  ? SHOULDER ACROMIOPLASTY Right 09/25/2017  ? Procedure: SHOULDER ACROMIOPLASTY;  Surgeon: Bjorn PippinVarkey, Dax T, MD;  Location: [facility];  Service: Orthopedics;  Laterality: Right;  ? SHOULDER ARTHROSCOPY WITH DEBRIDEMENT AND BICEP TENDON REPAIR Right 09/25/2017  ? Procedure: SHOULDER ARTHROSCOPY WITH DEBRIDEMENT AND BICEP TENDON REPAIR;  Surgeon: Bjorn PippinVarkey, Dax T, MD;  Location: [facility];  Service: Orthopedics;  Laterality: Right;  ? SHOULDER ARTHROSCOPY WITH ROTATOR CUFF REPAIR Right 09/25/2017  ? Procedure: SHOULDER ARTHROSCOPY WITH ROTATOR CUFF REPAIR;  Surgeon: Bjorn PippinVarkey, Dax T, MD;  Location: [facility];  Service: Orthopedics;  Laterality: Right;  ? TONSILLECTOMY  as a child  ? TUBAL LIGATION  12/29/2000  ? UPPER GASTROINTESTINAL ENDOSCOPY    ?  ? ?Current Meds  ?Medication Sig  ? acetaminophen (TYLENOL) 500 MG tablet Take 1,000 mg by mouth every 6 (six) hours as needed for moderate pain.  ? albuterol (VENTOLIN HFA) 108 (90 Base) MCG/ACT inhaler TAKE 2 PUFFS BY MOUTH EVERY 6 HOURS AS NEEDED FOR WHEEZE OR SHORTNESS OF BREATH  ? calcium carbonate (OS-CAL - DOSED IN MG OF ELEMENTAL CALCIUM) 1250 (500 Ca) MG tablet Take 1 tablet  by mouth.  ? cetirizine (ZYRTEC) 10 MG tablet TAKE 1 TABLET (10 MG TOTAL) BY MOUTH DAILY AS NEEDED FOR RHINITIS.  ? clindamycin (CLEOCIN T) 1 % lotion Apply 1 application topically daily.  ? EPINEPHrine (EPIPEN 2-PAK) 0.3 mg/0.3 mL IJ SOAJ injection Inject 0.3 mg into the muscle as needed for anaphylaxis.  ? esomeprazole (NEXIUM) 40 MG capsule Take 1 capsule (40 mg total) by mouth daily.  ? famotidine (PEPCID) 20 MG tablet Take 20 mg by mouth daily.  ? fluticasone furoate-vilanterol (BREO ELLIPTA) 100-25 MCG/INH AEPB Inhale 1 puff into the lungs daily as needed.  ? ondansetron (ZOFRAN) 4 MG tablet Take 1 tablet (4 mg total) by mouth daily as needed for nausea or vomiting.  ? RESTASIS 0.05 % ophthalmic emulsion Place 1 drop into both eyes 2 (two) times daily.  ? Semaglutide-Weight Management (WEGOVY) 0.25 MG/0.5ML SOAJ Inject 0.25 mg into  the skin once a week. (Patient not taking: Reported on 08/30/2021)  ? tretinoin (RETIN-A) 0.025 % cream tretinoin 0.025 % topical cream ? APPLY APPLICATIONS APPLY A PEA-SIZED AMOUNT TO FACE AT BEDTIME  ? Vitamin D, Ergocalciferol, (DRISDOL) 1.25 MG (50000 UNIT) CAPS capsule Take 1 capsule (50,000 Units total) by mouth every 7 (seven) days.  ?  ? ?Allergies:   Aspirin, Ibuprofen, Salmon [fish allergy], and Zoledronic acid  ? ?Social History  ? ?Tobacco Use  ? Smoking status: Never  ? Smokeless tobacco: Never  ?Vaping Use  ? Vaping Use: Never used  ?Substance Use Topics  ? Alcohol use: Yes  ?  Comment: occasional   ? Drug use: No  ?  ? ?Family Hx: ?The patient's family history includes Asthma in her daughter and son; Dementia in her father; Diabetes in her mother; Healthy in her daughter and son; Heart attack in her maternal grandmother; Hyperlipidemia in her mother; Hypertension in her mother; Sleep apnea in her mother; Stroke in her maternal grandfather; Thyroid disease in her sister. There is no history of Colon cancer, Colon polyps, Esophageal cancer, Rectal cancer, or Stomach  cancer. ? ?ROS:   ?Please see the history of present illness.    ?Review of Systems  ?Constitutional: Negative.   ?HENT: Negative.    ?Eyes: Negative.   ?Respiratory: Negative.    ?Cardiovascular: Negative.

## 2021-08-30 ENCOUNTER — Ambulatory Visit (INDEPENDENT_AMBULATORY_CARE_PROVIDER_SITE_OTHER): Payer: Managed Care, Other (non HMO) | Admitting: Internal Medicine

## 2021-08-30 ENCOUNTER — Encounter: Payer: Self-pay | Admitting: Internal Medicine

## 2021-08-30 VITALS — BP 126/74 | HR 69 | Temp 98.3°F | Ht 64.0 in | Wt 236.8 lb

## 2021-08-30 DIAGNOSIS — G4719 Other hypersomnia: Secondary | ICD-10-CM

## 2021-08-30 DIAGNOSIS — J4530 Mild persistent asthma, uncomplicated: Secondary | ICD-10-CM | POA: Diagnosis not present

## 2021-08-30 DIAGNOSIS — R5383 Other fatigue: Secondary | ICD-10-CM | POA: Diagnosis not present

## 2021-08-30 NOTE — Patient Instructions (Signed)
Please schedule follow up scheduled with myself in 3 months.  If my schedule is not open yet, we will contact you with a reminder closer to that time. Please call [phone] if you haven't heard from [place] a month before.  ? ?Before your next visit I would like you to have: ? ?Full set of PFTs - next available ?In lab sleep study ? ?Stop breo if you are having side effects continue albuterol as needed.  ?

## 2021-08-30 NOTE — Progress Notes (Signed)
? ?      [name]    CJ:[id number]    November 15, 1975 ? ?Primary Care Physician:Moore, Doreene Burke, [place] ?Date of Appointment: 08/30/2021 ?Established Patient Visit ? ?Chief complaint:   ?Chief Complaint  ?Patient presents with  ? Follow-up  ?  Asthma flares lately   ? ? ?HPI: ?[name] is a 46 y.o. woman with childhood bronchitis and asthma.  ? ?Interval Updates: ?I haven't seen her in 2 years. She was last seen by [facility] about a year ago.  ?Having trouble with dyspnea when laying flat and apneas, leg swelling and shortness of breath with exertion. All worsening over the last year.  ?Le edema has since improved.  ? ?She takes breo every 2-3 days because she says it makes her sweaty. Has been intolerant of other ICS-LABA in the past. No wheezing, chest tightness, coughing.  ? ?She is having significant sleep disruption and feels fatigued. Unsure if she smokes. Had sleep study in 2021 which showed minimal AHI and was not treated. Split night study was ordered at that time.  ? ?Has already seen cardiology and was told not related to heart symptoms.  ? ?Current Regimen:  Breo 100 prn albuterol . ?Asthma Triggers: exercise, talking for a long time.  ?Exacerbations in the last year: 1 (previously has gone multiple times a year since 2018.) ?History of hospitalization or intubation: none.  ?Allergy Testing: in 2019 environmental allergy skin prick testing is positive to box elder, dust mites and cockroach.  Select food allergy skin prick testing was positive to salmon ?GERD: yes on protonix BID (still having heart burn.) on carafate as well.  ?Allergic Rhinitis: yes taking claritin every day.  ?ACT:  ?Asthma Control Test ACT Total Score  ?08/30/2021 ? 4:54 PM 20  ?12/18/2020 ? 3:17 PM 21  ?07/07/2020 ? 3:00 PM 23  ? ?FeNO: 6 ppb on 5/21 ? ?I have reviewed the patient's family social and past medical history and updated as appropriate.  ? ?Past Medical History:  ?Diagnosis Date  ? AKI (acute kidney injury) ([place]) 10/06/2020  ?  Allergy   ? Anemia   ? Angio-edema   ? Anxiety   ? Apnea 12/08/2019  ? Arthritis   ? Asthma   ? Back pain   ? Constipation   ? Exertional dyspnea 03/14/2020  ? Fatty liver   ? GERD (gastroesophageal reflux disease)   ? H/O: hysterectomy   ? Hx of endometriosis   ? Inappropriate sinus tachycardia 12/08/2019  ? Insomnia   ? Joint pain   ? Lactose intolerance   ? Macromastia 07/2011  ? Migraine   ? Morbid obesity ([place]) 12/08/2019  ? Osteoporosis   ? Urticaria   ? Vitamin D deficiency   ? ? ?Past Surgical History:  ?Procedure Laterality Date  ? 73 HOUR [place] STUDY N/A 01/12/2018  ? Procedure: [place];  Surgeon: Mauri Pole, MD;  [facility];  Service: Endoscopy;  Laterality: N/A;  ? ABDOMINAL HYSTERECTOMY  12/12/2003  ? ADENOIDECTOMY  05/2011  ? ANKLE ARTHROSCOPY  12/29/2007  ? right; with extensive debridement  ? BLADDER NECK RECONSTRUCTION  01/14/2011  ? Procedure: BLADDER NECK REPAIR;  Surgeon: Donnamae Jude, MD;  Location: [facility];  Service: Gynecology;  Laterality: N/A;  Laparoscopic Repair of Incidental Cystotomy  ? BREAST REDUCTION SURGERY  08/05/2011  ? Procedure: MAMMARY REDUCTION  (BREAST);  Surgeon: Cristine Polio, MD;  Location: [facility];  Service: Plastics;  Laterality: Bilateral;  bilateral  ? BREAST SURGERY  2012  ? breast reduction  ? CESAREAN SECTION  12/29/2000; 1994  ? DILATION AND CURETTAGE OF UTERUS  07/08/2003  ? open laparoscopy  ? ESOPHAGEAL MANOMETRY N/A 01/12/2018  ? Procedure: ESOPHAGEAL MANOMETRY (EM);  Surgeon: Mauri Pole, MD;  [facility];  Service: Endoscopy;  Laterality: N/A;  ? GIVENS CAPSULE STUDY N/A 06/23/2017  ? Procedure: GIVENS CAPSULE STUDY;  Surgeon: Yetta Flock, MD;  Location: [facility];  Service: Gastroenterology;  Laterality: N/A;  ? LAPAROSCOPIC SALPINGOOPHERECTOMY  01/14/2011  ? left  ? [place] IMPEDANCE STUDY N/A 01/12/2018  ? Procedure: [place] IMPEDANCE STUDY;  Surgeon: Mauri Pole, MD;  [facility];  Service:  Endoscopy;  Laterality: N/A;  ? REPAIR PERONEAL TENDONS ANKLE  01/03/2010  ? repair right subluxing peroneal tendons  ? RESECTION DISTAL CLAVICAL Right 09/25/2017  ? Procedure: RESECTION DISTAL CLAVICAL;  Surgeon: Hiram Gash, MD;  Location: [facility];  Service: Orthopedics;  Laterality: Right;  ? RIGHT OOPHORECTOMY    ? with lysis of adhesions  ? SHOULDER ACROMIOPLASTY Right 09/25/2017  ? Procedure: SHOULDER ACROMIOPLASTY;  Surgeon: Hiram Gash, MD;  Location: [facility];  Service: Orthopedics;  Laterality: Right;  ? SHOULDER ARTHROSCOPY WITH DEBRIDEMENT AND BICEP TENDON REPAIR Right 09/25/2017  ? Procedure: SHOULDER ARTHROSCOPY WITH DEBRIDEMENT AND BICEP TENDON REPAIR;  Surgeon: Hiram Gash, MD;  Location: [facility];  Service: Orthopedics;  Laterality: Right;  ? SHOULDER ARTHROSCOPY WITH ROTATOR CUFF REPAIR Right 09/25/2017  ? Procedure: SHOULDER ARTHROSCOPY WITH ROTATOR CUFF REPAIR;  Surgeon: Hiram Gash, MD;  Location: [facility];  Service: Orthopedics;  Laterality: Right;  ? TONSILLECTOMY  as a child  ? TUBAL LIGATION  12/29/2000  ? UPPER GASTROINTESTINAL ENDOSCOPY    ? ? ?Family History  ?Problem Relation Age of Onset  ? Diabetes Mother   ? Hypertension Mother   ? Hyperlipidemia Mother   ? Sleep apnea Mother   ? Thyroid disease Sister   ? Dementia Father   ? Heart attack Maternal Grandmother   ? Stroke Maternal Grandfather   ? Asthma Son   ? Healthy Son   ? Asthma Daughter   ? Healthy Daughter   ? Colon cancer Neg Hx   ? Colon polyps Neg Hx   ? Esophageal cancer Neg Hx   ? Rectal cancer Neg Hx   ? Stomach cancer Neg Hx   ? ? ?Social History  ? ?Occupational History  ? Occupation: Recruitment consultant  ?  Employer: OTHER  ?  Comment: [employer]  ?Tobacco Use  ? Smoking status: Never  ? Smokeless tobacco: Never  ?Vaping Use  ? Vaping Use: Never used  ?Substance and Sexual Activity  ? Alcohol use: Yes  ?  Comment: occasional   ? Drug use: No  ? Sexual activity:  Not on file  ? ? ?Physical Exam: ?Blood pressure 126/74, pulse 69, temperature 98.3 ?F (36.8 ?C), temperature source Oral, height 5\' 4"  (1.626 m), weight 236 lb 12.8 oz (107.4 kg), last menstrual period 12/27/2003, SpO2 100 %. ? ?Gen:      No acute distress, fatigued ?Lungs:    ctab no wheezes or crackles ?CV:         rrr no edema ? ? ?Data Reviewed: ?Imaging: ?I have personally reviewed the chest xray April 2021 is clear.  ? ?PFTs: ? ? ? ?  Latest Ref Rng & Units 03/12/2017  ?  2:55 PM  ?PFT Results  ?FVC-Pre L 3.52    ?FVC-Predicted Pre % 114    ?FVC-Post L 3.25    ?FVC-Predicted Post % 106    ?Pre FEV1/FVC % % 79    ?Post FEV1/FCV % % 86    ?FEV1-Pre L 2.77    ?FEV1-Predicted Pre % 110    ?FEV1-Post L 2.78    ?DLCO uncorrected ml/min/mmHg 23.63    ?DLCO UNC% % 97    ?DLVA Predicted % 113    ?TLC L 4.54    ?TLC % Predicted % 89    ?RV % Predicted % 78    ? ?I have personally reviewed the patient's PFTs and spirometry today shows: ?No airflow limitation but there was a significant response to bronchodilator challenge. ?Spirometry August 2021 shows normal spirometry.  ? ? ?Cardiology notes reviewed.  ? ?Labs:IgE 33, environmental IgE panel negative (immunocap) in 2019. ? ?Immunization status: ?Immunization History  ?Administered Date(s) Administered  ? Influenza,inj,Quad PF,6+ Mos 02/15/2016, 12/24/2017, 02/24/2019, 04/20/2020, 04/23/2021  ? Td 05/06/1994  ? Tdap 12/06/2015  ? ? ?Assessment:  ?Moderate persistent asthma.  ?Allergic Rhinitis ?Esophageal Dysmotility and uncontrolled reflux s/p EGD ?Fatigue and excessive daytime sleepiness ? ?Plan/Recommendations: ?Can stop breo if adverse effects. Continue prn albuterol. Doesn't sound like symptoms are classic for asthma.  ?She is very concerned about her sleep. ?Repeat PFT and PSG ? ? ?Return to Care: ?Return in about 3 months (around 11/29/2021). ? ? ?Lenice Llamas, MD ?Pulmonary and Critical Care Medicine ?[place] ?[phone] ? ? ? ? ? ?

## 2021-09-03 ENCOUNTER — Encounter: Payer: Self-pay | Admitting: Nurse Practitioner

## 2021-09-05 ENCOUNTER — Ambulatory Visit: Payer: Self-pay | Admitting: Internal Medicine

## 2021-09-11 ENCOUNTER — Ambulatory Visit: Payer: Managed Care, Other (non HMO) | Admitting: Gastroenterology

## 2021-09-11 ENCOUNTER — Other Ambulatory Visit: Payer: Self-pay | Admitting: Nurse Practitioner

## 2021-09-11 ENCOUNTER — Other Ambulatory Visit: Payer: Commercial Managed Care - HMO

## 2021-09-11 DIAGNOSIS — R7309 Other abnormal glucose: Secondary | ICD-10-CM

## 2021-09-12 ENCOUNTER — Ambulatory Visit: Payer: Managed Care, Other (non HMO) | Admitting: Gastroenterology

## 2021-09-12 LAB — HEMOGLOBIN A1C
Est. average glucose Bld gHb Est-mCnc: 117 mg/dL
Hgb A1c MFr Bld: 5.7 % — ABNORMAL HIGH (ref 4.8–5.6)

## 2021-09-25 ENCOUNTER — Ambulatory Visit (INDEPENDENT_AMBULATORY_CARE_PROVIDER_SITE_OTHER): Payer: Commercial Managed Care - HMO | Admitting: Nurse Practitioner

## 2021-09-25 ENCOUNTER — Encounter: Payer: Self-pay | Admitting: Nurse Practitioner

## 2021-09-25 VITALS — BP 108/80 | HR 73 | Temp 98.0°F | Ht 64.0 in | Wt 234.8 lb

## 2021-09-25 DIAGNOSIS — R7309 Other abnormal glucose: Secondary | ICD-10-CM

## 2021-09-25 DIAGNOSIS — Z6841 Body Mass Index (BMI) 40.0 and over, adult: Secondary | ICD-10-CM | POA: Diagnosis not present

## 2021-09-25 DIAGNOSIS — R5383 Other fatigue: Secondary | ICD-10-CM

## 2021-09-25 DIAGNOSIS — E782 Mixed hyperlipidemia: Secondary | ICD-10-CM | POA: Diagnosis not present

## 2021-09-25 NOTE — Patient Instructions (Signed)
Fatigue If you have fatigue, you feel tired all the time and have a lack of energy or a lack of motivation. Fatigue may make it difficult to start or complete tasks because of exhaustion. Occasional or mild fatigue is often a normal response to activity or life. However, long-term (chronic) or extreme fatigue may be a symptom of a medical condition such as: Depression. Not having enough red blood cells or hemoglobin in the blood (anemia). A problem with a small gland located in the lower front part of the neck (thyroid disorder). Rheumatologic conditions. These are problems related to the body's defense system (immune system). Infections, especially certain viral infections. Fatigue can also lead to negative health outcomes over time. Follow these instructions at home: Medicines Take over-the-counter and prescription medicines only as told by your health care provider. Take a multivitamin if told by your health care provider. Do not use herbal or dietary supplements unless they are approved by your health care provider. Eating and drinking  Avoid heavy meals in the evening. Eat a well-balanced diet, which includes lean proteins, whole grains, plenty of fruits and vegetables, and low-fat dairy products. Avoid eating or drinking too many products with caffeine in them. Avoid alcohol. Drink enough fluid to keep your urine pale yellow. Activity  Exercise regularly, as told by your health care provider. Use or practice techniques to help you relax, such as yoga, tai chi, meditation, or massage therapy. Lifestyle Change situations that cause you stress. Try to keep your work and personal schedules in balance. Do not use recreational or illegal drugs. General instructions Monitor your fatigue for any changes. Go to bed and get up at the same time every day. Avoid fatigue by pacing yourself during the day and getting enough sleep at night. Maintain a healthy weight. Contact a health care  provider if: Your fatigue does not get better. You have a fever. You suddenly lose or gain weight. You have headaches. You have trouble falling asleep or sleeping through the night. You feel angry, guilty, anxious, or sad. You have swelling in your legs or another part of your body. Get help right away if: You feel confused, feel like you might faint, or faint. Your vision is blurry or you have a severe headache. You have severe pain in your abdomen, your back, or the area between your waist and hips (pelvis). You have chest pain, shortness of breath, or an irregular or fast heartbeat. You are unable to urinate, or you urinate less than normal. You have abnormal bleeding from the rectum, nose, lungs, nipples, or, if you are female, the vagina. You vomit blood. You have thoughts about hurting yourself or others. These symptoms may be an emergency. Get help right away. Call 911. Do not wait to see if the symptoms will go away. Do not drive yourself to the hospital. Get help right away if you feel like you may hurt yourself or others, or have thoughts about taking your own life. Go to your nearest emergency room or: Call 911. Call the [facility] at [phone] or 988. This is open 24 hours a day. Text the Crisis Text Line at 979-823-0385. Summary If you have fatigue, you feel tired all the time and have a lack of energy or a lack of motivation. Fatigue may make it difficult to start or complete tasks because of exhaustion. Long-term (chronic) or extreme fatigue may be a symptom of a medical condition. Exercise regularly, as told by your health care provider.  Change situations that cause you stress. Try to keep your work and personal schedules in balance. This information is not intended to replace advice given to you by your health care provider. Make sure you discuss any questions you have with your health care provider. Document Revised: 02/12/2021 Document  Reviewed: 02/12/2021 Elsevier Patient Education  2023 [employer]  

## 2021-09-25 NOTE — Progress Notes (Signed)
I,Victoria T Hamilton,acting as a Neurosurgeon for [name], FNP.,have documented all relevant documentation on the behalf of [name], FNP,as directed by  [name], FNP while in the presence of [name], FNP.   This visit occurred during the SARS-CoV-2 public health emergency.  Safety protocols were in place, including screening questions prior to the visit, additional usage of staff PPE, and extensive cleaning of exam room while observing appropriate contact time as indicated for disinfecting solutions.  Subjective:     Patient ID: [name] , female    DOB: 02/29/1976 , 46 y.o.   MRN: [id number]   Chief Complaint  Patient presents with   Fatigue    HPI  Pt presents today for feeling fatigue these past 2 weeks ago. She is unable to stay up.  Patient aware & reports being anemic. She has had to get an iron infusion a few years ago.  Her lung specialist is to set up a sleep study for her. She continues to feel tired after sleeping through the night. She is unaware if she snores.   She is to go through the orientation before scheduling an appt for Bariatric surgery    Past Medical History:  Diagnosis Date   AKI (acute kidney injury) (HCC) 10/06/2020   Allergy    Anemia    Angio-edema    Anxiety    Apnea 12/08/2019   Arthritis    Asthma    Back pain    Constipation    Exertional dyspnea 03/14/2020   Fatty liver    GERD (gastroesophageal reflux disease)    H/O: hysterectomy    Hx of endometriosis    Inappropriate sinus tachycardia 12/08/2019   Insomnia    Joint pain    Lactose intolerance    Macromastia 07/2011   Migraine    Morbid obesity (HCC) 12/08/2019   Osteoporosis    Urticaria    Vitamin D deficiency      Family History  Problem Relation Age of Onset   Diabetes Mother    Hypertension Mother    Hyperlipidemia Mother    Sleep apnea Mother    Thyroid disease Sister    Dementia Father    Heart attack Maternal Grandmother    Stroke Maternal  Grandfather    Asthma Son    Healthy Son    Asthma Daughter    Healthy Daughter    Colon cancer Neg Hx    Colon polyps Neg Hx    Esophageal cancer Neg Hx    Rectal cancer Neg Hx    Stomach cancer Neg Hx      Current Outpatient Medications:    acetaminophen (TYLENOL) 500 MG tablet, Take 1,000 mg by mouth every 6 (six) hours as needed for moderate pain., Disp: , Rfl:    albuterol (VENTOLIN HFA) 108 (90 Base) MCG/ACT inhaler, TAKE 2 PUFFS BY MOUTH EVERY 6 HOURS AS NEEDED FOR WHEEZE OR SHORTNESS OF BREATH, Disp: 6.7 each, Rfl: 11   calcium carbonate (OS-CAL - DOSED IN MG OF ELEMENTAL CALCIUM) 1250 (500 Ca) MG tablet, Take 1 tablet by mouth., Disp: , Rfl:    cetirizine (ZYRTEC) 10 MG tablet, TAKE 1 TABLET (10 MG TOTAL) BY MOUTH DAILY AS NEEDED FOR RHINITIS., Disp: 90 tablet, Rfl: 1   clindamycin (CLEOCIN T) 1 % lotion, Apply 1 application topically daily., Disp: , Rfl:    EPINEPHrine (EPIPEN 2-PAK) 0.3 mg/0.3 mL IJ SOAJ injection, Inject 0.3 mg into the muscle as needed for anaphylaxis., Disp: 2  each, Rfl: 2   esomeprazole (NEXIUM) 40 MG capsule, Take 1 capsule (40 mg total) by mouth daily., Disp: 90 capsule, Rfl: 1   famotidine (PEPCID) 20 MG tablet, Take 20 mg by mouth daily., Disp: , Rfl:    fluticasone furoate-vilanterol (BREO ELLIPTA) 100-25 MCG/INH AEPB, Inhale 1 puff into the lungs daily as needed., Disp: 60 each, Rfl: 11   ondansetron (ZOFRAN) 4 MG tablet, Take 1 tablet (4 mg total) by mouth daily as needed for nausea or vomiting., Disp: 30 tablet, Rfl: 0   RESTASIS 0.05 % ophthalmic emulsion, Place 1 drop into both eyes 2 (two) times daily., Disp: , Rfl:    tretinoin (RETIN-A) 0.025 % cream, tretinoin 0.025 % topical cream  APPLY APPLICATIONS APPLY A PEA-SIZED AMOUNT TO FACE AT BEDTIME, Disp: , Rfl:    Vitamin D, Ergocalciferol, (DRISDOL) 1.25 MG (50000 UNIT) CAPS capsule, Take 1 capsule (50,000 Units total) by mouth every 7 (seven) days., Disp: 15 capsule, Rfl: 3   Semaglutide-Weight  Management (WEGOVY) 0.25 MG/0.5ML SOAJ, Inject 0.25 mg into the skin once a week. (Patient not taking: Reported on 08/30/2021), Disp: 2 mL, Rfl: 0   Allergies  Allergen Reactions   Aspirin    Ibuprofen    Salmon [Fish Allergy]    Zoledronic Acid Other (See Comments)     Review of Systems  Constitutional:  Positive for fatigue.  Respiratory: Negative.    Cardiovascular: Negative.   Neurological:  Negative for dizziness and headaches.  Psychiatric/Behavioral: Negative.      Today's Vitals   09/25/21 1528  BP: 108/80  Pulse: 73  Temp: 98 F (36.7 C)  SpO2: 98%  Weight: 234 lb 12.8 oz (106.5 kg)  Height: 5\' 4"  (1.626 m)  PainSc: 4    Body mass index is 40.3 kg/m.  Wt Readings from Last 3 Encounters:  09/25/21 234 lb 12.8 oz (106.5 kg)  08/30/21 236 lb 12.8 oz (107.4 kg)  08/14/21 231 lb 8 oz (105 kg)    Objective:  Physical Exam Vitals reviewed.  Constitutional:      General: She is not in acute distress.    Appearance: Normal appearance. She is obese.  Cardiovascular:     Rate and Rhythm: Normal rate and regular rhythm.     Pulses: Normal pulses.     Heart sounds: Normal heart sounds. No murmur heard. Pulmonary:     Effort: Pulmonary effort is normal. No respiratory distress.     Breath sounds: No wheezing.  Neurological:     General: No focal deficit present.     Mental Status: She is alert and oriented to person, place, and time.     Cranial Nerves: No cranial nerve deficit.     Motor: No weakness.  Psychiatric:        Mood and Affect: Mood normal.        Behavior: Behavior normal.        Thought Content: Thought content normal.        Judgment: Judgment normal.        Assessment And Plan:     1. Other fatigue Comments: Will check additional metabolic labs, pending lab results will refer for sleep study if her specialist does not order. This has worsened in the last 2 weeks. - Iron, TIBC and Ferritin Panel - Vitamin B12 - TSH  2. Abnormal  glucose Comments: Stable,   3. Mixed hyperlipidemia Comments: Stable, diet controlled. Encouraged to eat a low fat diet  4. BMI 40.0-44.9, adult (  HCC) She is encouraged to strive for BMI less than 30 to decrease cardiac risk. Advised to aim for at least 150 minutes of exercise per week. We had a long discussion about her weight , will refer to PREP exercise program we have started [facility], will see if her insurance covers the medication discussed side effects to include nausea, difficulty swallowing and abdominal pain. She is to titrate weekly as tolerated. Goal to lose 10% body weight in 4 months if approved by insurance - Amb Referral To Provider Referral Exercise Program (P.R.E.P)    Patient was given opportunity to ask questions. Patient verbalized understanding of the plan and was able to repeat key elements of the plan. All questions were answered to their satisfaction.  [name], FNP   I, [name], FNP, have reviewed all documentation for this visit. The documentation on 09/25/21 for the exam, diagnosis, procedures, and orders are all accurate and complete.   IF YOU HAVE BEEN REFERRED TO A SPECIALIST, IT MAY TAKE 1-2 WEEKS TO SCHEDULE/PROCESS THE REFERRAL. IF YOU HAVE NOT HEARD FROM US/SPECIALIST IN TWO WEEKS, PLEASE GIVE [place] A CALL AT [phone].   THE PATIENT IS ENCOURAGED TO PRACTICE SOCIAL DISTANCING DUE TO THE COVID-19 PANDEMIC.

## 2021-09-26 LAB — TSH: TSH: 1.14 u[IU]/mL (ref 0.450–4.500)

## 2021-09-26 LAB — IRON,TIBC AND FERRITIN PANEL
Ferritin: 32 ng/mL (ref 15–150)
Iron Saturation: 14 % — ABNORMAL LOW (ref 15–55)
Iron: 50 ug/dL (ref 27–159)
Total Iron Binding Capacity: 370 ug/dL (ref 250–450)
UIBC: 320 ug/dL (ref 131–425)

## 2021-09-26 LAB — VITAMIN B12: Vitamin B-12: 796 pg/mL (ref 232–1245)

## 2021-09-28 ENCOUNTER — Telehealth: Payer: Self-pay

## 2021-09-28 NOTE — Telephone Encounter (Signed)
Called to discuss PREP program; she did take and complete the program last year, but feels she needs the education material and exercise instruction again; offered 8am class at [place] in June, but she lives closer to [place]; next class at [place] will start in July; will keep her on call back list for Yehuda Budd and have Pam RN [facility] contact her about July schedule for [place].

## 2021-10-05 ENCOUNTER — Telehealth: Payer: Self-pay | Admitting: Internal Medicine

## 2021-10-05 ENCOUNTER — Ambulatory Visit (INDEPENDENT_AMBULATORY_CARE_PROVIDER_SITE_OTHER): Payer: Commercial Managed Care - HMO | Admitting: Internal Medicine

## 2021-10-05 DIAGNOSIS — J4530 Mild persistent asthma, uncomplicated: Secondary | ICD-10-CM

## 2021-10-05 LAB — PULMONARY FUNCTION TEST
DL/VA % pred: 121 %
DL/VA: 5.29 ml/min/mmHg/L
DLCO cor % pred: 115 %
DLCO cor: 24.76 ml/min/mmHg
DLCO unc % pred: 115 %
DLCO unc: 24.76 ml/min/mmHg
FEF 25-75 Post: 3.16 L/sec
FEF 25-75 Pre: 3.33 L/sec
FEF2575-%Change-Post: -5 %
FEF2575-%Pred-Post: 121 %
FEF2575-%Pred-Pre: 128 %
FEV1-%Change-Post: 0 %
FEV1-%Pred-Post: 112 %
FEV1-%Pred-Pre: 113 %
FEV1-Post: 2.72 L
FEV1-Pre: 2.73 L
FEV1FVC-%Change-Post: 3 %
FEV1FVC-%Pred-Pre: 103 %
FEV6-%Change-Post: -3 %
FEV6-%Pred-Post: 105 %
FEV6-%Pred-Pre: 110 %
FEV6-Post: 3.07 L
FEV6-Pre: 3.2 L
FEV6FVC-%Pred-Post: 102 %
FEV6FVC-%Pred-Pre: 102 %
FVC-%Change-Post: -4 %
FVC-%Pred-Post: 103 %
FVC-%Pred-Pre: 107 %
FVC-Post: 3.07 L
FVC-Pre: 3.2 L
Post FEV1/FVC ratio: 89 %
Post FEV6/FVC ratio: 100 %
Pre FEV1/FVC ratio: 85 %
Pre FEV6/FVC Ratio: 100 %
RV % pred: 90 %
RV: 1.54 L
TLC % pred: 98 %
TLC: 4.98 L

## 2021-10-05 NOTE — Telephone Encounter (Signed)
The order was placed- see under procedures tab Sending to [facility] regarding this being scheduled

## 2021-10-05 NOTE — Patient Instructions (Signed)
Full PFT Performed Today  

## 2021-10-05 NOTE — Progress Notes (Signed)
Full PFT Performed Today  

## 2021-10-09 ENCOUNTER — Encounter: Payer: Self-pay | Admitting: Nurse Practitioner

## 2021-10-09 ENCOUNTER — Encounter: Payer: Self-pay | Admitting: Internal Medicine

## 2021-10-11 ENCOUNTER — Other Ambulatory Visit: Payer: Self-pay | Admitting: Nurse Practitioner

## 2021-10-18 ENCOUNTER — Ambulatory Visit: Payer: Managed Care, Other (non HMO) | Admitting: Gastroenterology

## 2021-10-18 ENCOUNTER — Other Ambulatory Visit: Payer: Self-pay | Admitting: Anesthesiology

## 2021-10-18 ENCOUNTER — Ambulatory Visit
Admission: RE | Admit: 2021-10-18 | Discharge: 2021-10-18 | Disposition: A | Discharge status: Home / Self Care | Payer: Commercial Managed Care - HMO | Source: Ambulatory Visit | Attending: Anesthesiology | Admitting: Anesthesiology

## 2021-10-18 DIAGNOSIS — R52 Pain, unspecified: Secondary | ICD-10-CM

## 2021-10-25 ENCOUNTER — Encounter: Payer: Self-pay | Admitting: Nurse Practitioner

## 2021-10-25 NOTE — Telephone Encounter (Signed)
PA req placed by [facility] on 10/24/21

## 2021-10-29 ENCOUNTER — Other Ambulatory Visit: Payer: Self-pay | Admitting: Nurse Practitioner

## 2021-10-29 DIAGNOSIS — Z6841 Body Mass Index (BMI) 40.0 and over, adult: Secondary | ICD-10-CM

## 2021-10-29 DIAGNOSIS — R5383 Other fatigue: Secondary | ICD-10-CM

## 2021-10-29 DIAGNOSIS — R400 Somnolence: Secondary | ICD-10-CM

## 2021-10-30 ENCOUNTER — Telehealth: Payer: Self-pay

## 2021-10-30 ENCOUNTER — Encounter: Payer: Self-pay | Admitting: Nurse Practitioner

## 2021-10-30 NOTE — Telephone Encounter (Signed)
VMT pt requesting call back about starting PREP on 11/12/21 at [facility]

## 2021-11-03 ENCOUNTER — Other Ambulatory Visit: Payer: Self-pay | Admitting: Nurse Practitioner

## 2021-11-03 DIAGNOSIS — E559 Vitamin D deficiency, unspecified: Secondary | ICD-10-CM

## 2021-11-09 NOTE — Progress Notes (Signed)
[facility] PREP Evaluation  Patient Details  Name: NICKOLA LENIG MRN: [id number] Date of Birth: 1975-08-17 Age: 46 y.o. PCP: [name], FNP  Vitals:   11/08/21 1700  BP: 114/80  Pulse: (!) 59  SpO2: 99%  Weight: 241 lb 9.6 oz (109.6 kg)     [facility] Eval - 11/09/21 1200       [facility] "PREP" Location   [facility] "PREP" Location [facility]      Referral    Referring Provider Christell Constant    Reason for referral Inactivity;Obesitity/Overweight    Program Start Date 11/12/21   M/W 230p-345p x 12 wks     Measurement   Waist Circumference 49 inches    Hip Circumference 53 inches    Body fat 46.9 percent      Information for Trainer   Goals Lose wt: goal 180, 20 lbs in 12 wks, change eating habits    Current Exercise None    Orthopedic Concerns Knees, right neck pain    Pertinent Medical History Prediabetes    Current Barriers none    Restrictions/Precautions --   none   Medications that affect exercise --   reports no meds     Timed Up and Go (TUGS)   Timed Up and Go Low risk <9 seconds      Mobility and Daily Activities   I find it easy to walk up or down two or more flights of stairs. 2    I have no trouble taking out the trash. 4    I do housework such as vacuuming and dusting on my own without difficulty. 3    I can easily lift a gallon of milk (8lbs). 4    I can easily walk a mile. 2    I have no trouble reaching into high cupboards or reaching down to pick up something from the floor. 3    I do not have trouble doing out-door work such as Loss adjuster, chartered, raking leaves, or gardening. 2      Mobility and Daily Activities   I feel younger than my age. 1    I feel independent. 4    I feel energetic. 1    I live an active life.  1    I feel strong. 4    I feel healthy. 2    I feel active as other people my age. 1      How fit and strong are you.   Fit and Strong Total Score 34            Past Medical History:  Diagnosis Date   AKI (acute kidney injury) (HCC)  10/06/2020   Allergy    Anemia    Angio-edema    Anxiety    Apnea 12/08/2019   Arthritis    Asthma    Back pain    Constipation    Exertional dyspnea 03/14/2020   Fatty liver    GERD (gastroesophageal reflux disease)    H/O: hysterectomy    Hx of endometriosis    Inappropriate sinus tachycardia 12/08/2019   Insomnia    Joint pain    Lactose intolerance    Macromastia 07/2011   Migraine    Morbid obesity (HCC) 12/08/2019   Osteoporosis    Urticaria    Vitamin D deficiency    Past Surgical History:  Procedure Laterality Date   77 HOUR PH STUDY N/A 01/12/2018   Procedure: 24 HOUR PH STUDY;  Surgeon: Napoleon Form, MD;  Location: [facility];  Service: Endoscopy;  Laterality: N/A;   ABDOMINAL HYSTERECTOMY  12/12/2003   ADENOIDECTOMY  05/2011   ANKLE ARTHROSCOPY  12/29/2007   right; with extensive debridement   BLADDER NECK RECONSTRUCTION  01/14/2011   Procedure: BLADDER NECK REPAIR;  Surgeon: Reva Bores, MD;  Location: [facility];  Service: Gynecology;  Laterality: N/A;  Laparoscopic Repair of Incidental Cystotomy   BREAST REDUCTION SURGERY  08/05/2011   Procedure: MAMMARY REDUCTION  (BREAST);  Surgeon: Louisa Second, MD;  Location: [facility];  Service: Plastics;  Laterality: Bilateral;  bilateral   BREAST SURGERY  2012   breast reduction   CESAREAN SECTION  12/29/2000; 1994   DILATION AND CURETTAGE OF UTERUS  07/08/2003   open laparoscopy   ESOPHAGEAL MANOMETRY N/A 01/12/2018   Procedure: ESOPHAGEAL MANOMETRY (EM);  Surgeon: Napoleon Form, MD;  Location: [facility];  Service: Endoscopy;  Laterality: N/A;   GIVENS CAPSULE STUDY N/A 06/23/2017   Procedure: GIVENS CAPSULE STUDY;  Surgeon: Benancio Deeds, MD;  Location: [facility];  Service: Gastroenterology;  Laterality: N/A;   LAPAROSCOPIC SALPINGOOPHERECTOMY  01/14/2011   left   PH IMPEDANCE STUDY N/A 01/12/2018   Procedure: PH IMPEDANCE STUDY;  Surgeon: Napoleon Form, MD;  Location: [facility];   Service: Endoscopy;  Laterality: N/A;   REPAIR PERONEAL TENDONS ANKLE  01/03/2010   repair right subluxing peroneal tendons   RESECTION DISTAL CLAVICAL Right 09/25/2017   Procedure: RESECTION DISTAL CLAVICAL;  Surgeon: Bjorn Pippin, MD;  Location: [facility];  Service: Orthopedics;  Laterality: Right;   RIGHT OOPHORECTOMY     with lysis of adhesions   SHOULDER ACROMIOPLASTY Right 09/25/2017   Procedure: SHOULDER ACROMIOPLASTY;  Surgeon: Bjorn Pippin, MD;  Location: [facility];  Service: Orthopedics;  Laterality: Right;   SHOULDER ARTHROSCOPY WITH DEBRIDEMENT AND BICEP TENDON REPAIR Right 09/25/2017   Procedure: SHOULDER ARTHROSCOPY WITH DEBRIDEMENT AND BICEP TENDON REPAIR;  Surgeon: Bjorn Pippin, MD;  Location: [facility];  Service: Orthopedics;  Laterality: Right;   SHOULDER ARTHROSCOPY WITH ROTATOR CUFF REPAIR Right 09/25/2017   Procedure: SHOULDER ARTHROSCOPY WITH ROTATOR CUFF REPAIR;  Surgeon: Bjorn Pippin, MD;  Location: [facility];  Service: Orthopedics;  Laterality: Right;   TONSILLECTOMY  as a child   TUBAL LIGATION  12/29/2000   UPPER GASTROINTESTINAL ENDOSCOPY     Social History   Tobacco Use  Smoking Status Never  Smokeless Tobacco Never    Bonnye Fava 11/09/2021, 12:24 PM

## 2021-11-13 NOTE — Progress Notes (Signed)
[facility] PREP Weekly Session  Patient Details  Name: [name] MRN: [id number] Date of Birth: 18-Aug-1975 Age: 46 y.o. PCP: [name], FNP  There were no vitals filed for this visit.   [facility] Weekly seesion - 11/13/21 1700       [facility] "PREP" Location   [facility]P" [facility]      Weekly Session   Topic Discussed Goal setting and welcome to the program   Scale of perceived exertion   Classes attended to date 1             [name] 11/13/2021, 5:31 PM

## 2021-11-14 ENCOUNTER — Telehealth: Payer: Self-pay | Admitting: Hematology

## 2021-11-14 NOTE — Telephone Encounter (Signed)
Scheduled appt per 7/12 referral. Pt was already established with Dr. Candise Che. Pt is aware of appt date and time. Pt is aware to arrive 15 mins prior to appt time and to bring and updated insurance card. Pt is aware of appt location.

## 2021-11-22 ENCOUNTER — Encounter: Payer: Self-pay | Admitting: Dietician

## 2021-11-22 ENCOUNTER — Encounter: Payer: Commercial Managed Care - HMO | Attending: General Surgery | Admitting: Dietician

## 2021-11-22 DIAGNOSIS — Z6841 Body Mass Index (BMI) 40.0 and over, adult: Secondary | ICD-10-CM | POA: Insufficient documentation

## 2021-11-22 DIAGNOSIS — E7800 Pure hypercholesterolemia, unspecified: Secondary | ICD-10-CM | POA: Diagnosis not present

## 2021-11-22 DIAGNOSIS — R7303 Prediabetes: Secondary | ICD-10-CM | POA: Insufficient documentation

## 2021-11-22 DIAGNOSIS — K219 Gastro-esophageal reflux disease without esophagitis: Secondary | ICD-10-CM | POA: Insufficient documentation

## 2021-11-22 NOTE — Progress Notes (Signed)
Nutrition Assessment for Bariatric Surgery Medical Nutrition Therapy Appt Start Time: 4:22    End Time: 5:45  Patient was seen on 11/22/2021 for Pre-Operative Nutrition Assessment. Letter of approval faxed to [facility] bariatric surgery program coordinator on 11/22/2021.   Referral stated Supervised Weight Loss (SWL) visits needed: 0  Pt completed visits.   Pt has cleared nutrition requirements.   Planned surgery: RYGB   NUTRITION ASSESSMENT   Anthropometrics  Start weight at [facility]: 241.4 lbs (date: 11/22/2021)  Height: 64 in BMI: 41.44 kg/m2     Clinical  Medical hx: Allergies, Anemia, Anxiety, arthritis, asthma, GERD, kidney disease, osteoporosis Medications: Esomeprazole, one a day vitamin, vit D, Calcium  Labs: Hemoglobin 10.4; Hematocrit 33.4;  Notable signs/symptoms: nothing noted Any previous deficiencies? No  Micronutrient Nutrition Focused Physical Exam: Hair: No issues observed Eyes: No issues observed Mouth: No issues observed Neck: No issues observed Nails: No issues observed Skin: No issues observed  Lifestyle & Dietary Hx  Ms. Patient lives with her son. The pt performs the food shopping and the pt prepares the meals. She reports that she typically skips or misses 2 out of 21 possible meals per week. She may have 1-2 meals per week that are take-out or at a restaurant. 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 She denies binge eating and denies feeling shame and/or guilt after eating too much food.  She denies having used laxatives or vomiting to facilitate weight loss. She denies emotional eating during times of stress. She states that she knows the difference between hunger and thirst and can tell when she is full. Pt states she walks 2-3 times a week at the [facility], and light weights.  Pt states she likes sweets, stating that is what she has for snacks.  Pt states she has already started cutting back on candy.  24-Hr Dietary  Recall First Meal: [place] bacon, fruit Snack: candy Second Meal: fruit, sandwich  Snack: candy Third Meal: salad, or [place] burger, cauliflower rice Snack: candy Beverages: water, crystal light, diet soda, iced coffee   Estimated Energy Needs Calories: 1500   NUTRITION DIAGNOSIS  Overweight/obesity (-3.3) related to past poor dietary habits and physical inactivity as evidenced by patient w/ planned RYGB surgery following dietary guidelines for continued weight loss.    NUTRITION INTERVENTION  Nutrition counseling (C-1) and education (E-2) to facilitate bariatric surgery goals.  Educated pt on micronutrient deficiencies post surgery and strategies to mitigate that risk   Pre-Op Goals Reviewed with the Patient Track food and beverage intake (pen and paper, MyFitness Pal, Baritastic app, etc.) Make healthy food choices while monitoring portion sizes Consume 3 meals per day or try to eat every 3-5 hours Avoid concentrated sugars and fried foods Keep sugar & fat in the single digits per serving on food labels Practice CHEWING your food (aim for applesauce consistency) Practice not drinking 15 minutes before, during, and 30 minutes after each meal and snack Avoid all carbonated beverages (ex: soda, sparkling beverages)  Limit caffeinated beverages (ex: coffee, tea, energy drinks) Avoid all sugar-sweetened beverages (ex: regular soda, sports drinks)  Avoid alcohol  Aim for 64-100 ounces of FLUID daily (with at least half of fluid intake being plain water)  Aim for at least 60-80 grams of PROTEIN daily Look for a liquid protein source that contains ?15 g protein and ?5 g carbohydrate (ex: shakes, drinks, shots) Make a list of non-food related activities Physical activity is an important part of a healthy  lifestyle so keep it moving! The goal is to reach 150 minutes of exercise per week, including cardiovascular and weight baring activity.  *Goals that are bolded indicate the pt  would like to start working towards these  Handouts Provided Include  Bariatric Surgery handouts (Nutrition Visits, Pre-Op Goals, Protein Shakes, Vitamins & Minerals)  Learning Style & Readiness for Change Teaching method utilized: Visual & Auditory  Demonstrated degree of understanding via: Teach Back  Readiness Level: preparation Barriers to learning/adherence to lifestyle change: sweet cravings  RD's Notes for Next Visit     MONITORING & EVALUATION Dietary intake, weekly physical activity, body weight, and pre-op goals reached at next nutrition visit.    Next Steps  Pt has completed visits. No further supervised visits required/recomended  Patient is to follow up at [facility] for Pre-Op Class >2 weeks before surgery for further nutrition education.

## 2021-11-27 ENCOUNTER — Encounter: Payer: Self-pay | Admitting: Nurse Practitioner

## 2021-11-27 NOTE — Progress Notes (Signed)
[facility] PREP Weekly Session  Patient Details  Name: [name] MRN: [id number] Date of Birth: [birth year] Age: 46 y.o. PCP: [name], FNP  Vitals:   11/26/21 1430  Weight: 240 lb (108.9 kg)     [facility] Weekly seesion - 11/27/21 1700       [facility] "PREP" Location   [facility]      Weekly Session   Topic Discussed Healthy eating tips    Minutes exercised this week 90 minutes    Classes attended to date 4             Pam Jerral Bonito 11/27/2021, 5:35 PM

## 2021-12-05 ENCOUNTER — Ambulatory Visit: Payer: Commercial Managed Care - HMO | Admitting: Gastroenterology

## 2021-12-05 NOTE — Progress Notes (Deleted)
HPI :   46 year old female with chronic epigastric pain, nausea, history of dysphagia, here for follow-up visit.   Recall she has had a history of reflux, dysphagia, epigastric pain, early satiety, ongoing for years.  She has had an extensive evaluation as outlined below.  This past June she was admitted for a spontaneous bladder perforation from which she recovered.  She had epigastric pain/dyspepsia when we last saw her over the summer, she had an EGD with me in August which showed a small hiatal hernia but no concerning pathology.  We discussed using FD guard as needed.   She states she cannot remember if she even tried FD guard.  Essentially her symptoms persist.  She has discomfort in her epigastric to right upper quadrant which can come and go, bothering her more so over the past 6 months.  She has ongoing early satiety with a poor appetite.  She frequently feels nauseated.  She does not really vomit.  Her stomach can remain upset for some time, although unclear if eating truly makes her pain worse or not.  Sometimes she has positional component to her pain when she lies on her left side it does not hurt as much.  Recall she has had EGD, CT scan, gastric emptying study, etc. to work-up her symptoms in the past.  Remotely in 2011 she had a positive gastric emptying study and diagnosed with gastroparesis.  She was tried on Reglan at that time and reportedly had twitching and did not tolerate it.  I repeated a gastric emptying study in 2019 which was normal.  That being said she continues to have nausea frequently, takes Zofran which does help, ongoing early satiety with poor appetite.  We discussed other options.  No alarm symptoms or weight loss otherwise.  She has a history of cholecystectomy.  She does have regurgitation that bothers her but no pyrosis she is on Nexium 40 mg twice daily which controls her heartburn/reflux symptoms.   She does have chronic dysphagia that has been worked up  extensively.  She states this is not really bothering her too much lately, is mild.   Prior workup: Capsule endoscopy - 06/23/17 - complete study with good prep, no clear cause for iron deficiency EGD 02/20/2017 - 2cm HH, otherwise normal exam, biopsies negative for HP Colonoscopy 02/20/2017 - focal mild erythema in the rectum - biopsied showed no evidence of colitis, otherwise normal colon and ileum  MRI liver / abdomen - 02/10/2017 - focal fatty infiltration, normal biliary tree, otherwise no pathology noted in the abdomen   Historic evaluation: CT scan neck 12/02/16 - normal CT angio of chest 12/02/2016 - normal EGD 08/19/2013 - Dr. Alycia Rossetti - normal esophagus, mild gastritis Esophageal manometry 2015 - normal PH study 2015 - normal exam, Demeester score of 5.7, positive symptom index for regurgitation and reflux EGG normal 2015 Normal hydrogen breath test 2015 Normal gastric emptying scan 2015 GES - delayed in 2011 - Reglan caused twitching and it was stopped.    GES 11/13/2017 - normal   Manometry / 24 hr pH impedance testing was recommended   Esophageal manometry 01/12/18 - normal PH impedance study 01/12/18 - Demeester score 9, SAP positively correlates with reflux, good acid suppression on PPI   Barium swallow 09/02/19 - IMPRESSION: Mild intermittent esophageal dysmotility. Small sliding hiatal hernia. Otherwise unremarkable esophagram as described.     01/05/2020 EGD done for dysphagia.  She had a 3 cm hiatal hernia and never thing else was otherwise  normal.     EGD 12/19/20 -  - A 2 cm hiatal hernia was present. - The exam of the esophagus was otherwise normal. - Patchy erythematous mucosa was found in the gastric body without focal ulceration. - The exam of the stomach was otherwise normal. - Biopsies were taken with a cold forceps in the gastric body, at the incisura and in the gastric antrum for Helicobacter pylori testing. - The duodenal bulb and second portion of the duodenum  were normal.   Surgical [P], gastric antrum and gastric body - ANTRAL AND OXYNTIC MUCOSA WITH SLIGHT CHRONIC INFLAMMATION. [place] NEGATIVE FOR HELICOBACTER PYLORI. - NO INTESTINAL METAPLASIA, DYSPLASIA OR CARCINOMA.     46 year old female here for reassessment of the following:   Chronic epigastric pain Chronic nausea Early satiety   Extensive work-up in years past.  She has had persistent symptoms since have last seen her in August.  EGD did not show any concerning pathology.  I think she likely has a functional GI tract disorder driving her upper tract symptoms, we discussed this.  She previously had a positive gastric emptying study but the last 1 looked okay.  She does recall being on Reglan and did not tolerate it at all and does not want to have another trial of it.  I do think trying FD guard is reasonable for component of dyspepsia, she did not take it previously and we will try that.  Otherwise given her chronic nausea, early satiety, I think a trial of Remeron may be reasonable to see if we can get better control of this.  We will start 15 mg nightly and see how she does with it.  Discussed risks of the medication and she wants to proceed.  I will see her back in 2 weeks for follow-up.  Most of her pain is in the upper abdomen and I think related to dyspepsia/functional tract disorder, however also has fleeting pains in her mid abdomen at times.  If she does not respond to Remeron may consider a trial of Cymbalta for chronic pain syndrome.  If she has significant interval worsening over time may consider another CT scan but last imaged June of last year after recovering from her bladder issue which looked okay in the setting of the symptoms.  She agreed   Plan: - FD gard trial - samples provided - start Remeron 15mg  q HS  - follow up in 2 months, consider cymbalta pending her course - she declines another trial of Reglan at this time - consider repeat CT if worsening but  symptoms chronic / stable over time.     Saw Dr. in June about gastric bypass vs. Sleeve for obesity    Past Medical History:  Diagnosis Date   AKI (acute kidney injury) (HCC) 10/06/2020   Allergy    Anemia    Angio-edema    Anxiety    Apnea 12/08/2019   Arthritis    Asthma    Back pain    Constipation    Exertional dyspnea 03/14/2020   Fatty liver    GERD (gastroesophageal reflux disease)    H/O: hysterectomy    Hx of endometriosis    Inappropriate sinus tachycardia 12/08/2019   Insomnia    Joint pain    Lactose intolerance    Macromastia 07/2011   Migraine    Morbid obesity (HCC) 12/08/2019   Osteoporosis    Urticaria    Vitamin D deficiency      Past Surgical  History:  Procedure Laterality Date   57 HOUR PH STUDY N/A 01/12/2018   Procedure: 24 HOUR PH STUDY;  Surgeon: Napoleon Form, MD;  Location: [facility];  Service: Endoscopy;  Laterality: N/A;   ABDOMINAL HYSTERECTOMY  12/12/2003   ADENOIDECTOMY  05/2011   ANKLE ARTHROSCOPY  12/29/2007   right; with extensive debridement   BLADDER NECK RECONSTRUCTION  01/14/2011   Procedure: BLADDER NECK REPAIR;  Surgeon: Reva Bores, MD;  Location: [facility];  Service: Gynecology;  Laterality: N/A;  Laparoscopic Repair of Incidental Cystotomy   BREAST REDUCTION SURGERY  08/05/2011   Procedure: MAMMARY REDUCTION  (BREAST);  Surgeon: Louisa Second, MD;  Location: [facility];  Service: Plastics;  Laterality: Bilateral;  bilateral   BREAST SURGERY  2012   breast reduction   CESAREAN SECTION  12/29/2000; 1994   DILATION AND CURETTAGE OF UTERUS  07/08/2003   open laparoscopy   ESOPHAGEAL MANOMETRY N/A 01/12/2018   Procedure: ESOPHAGEAL MANOMETRY (EM);  Surgeon: Napoleon Form, MD;  Location: [facility];  Service: Endoscopy;  Laterality: N/A;   GIVENS CAPSULE STUDY N/A 06/23/2017   Procedure: GIVENS CAPSULE STUDY;  Surgeon: Benancio Deeds, MD;  Location: [facility];  Service: Gastroenterology;   Laterality: N/A;   LAPAROSCOPIC SALPINGOOPHERECTOMY  01/14/2011   left   PH IMPEDANCE STUDY N/A 01/12/2018   Procedure: PH IMPEDANCE STUDY;  Surgeon: Napoleon Form, MD;  Location: [facility];  Service: Endoscopy;  Laterality: N/A;   REPAIR PERONEAL TENDONS ANKLE  01/03/2010   repair right subluxing peroneal tendons   RESECTION DISTAL CLAVICAL Right 09/25/2017   Procedure: RESECTION DISTAL CLAVICAL;  Surgeon: Bjorn Pippin, MD;  Location: [facility];  Service: Orthopedics;  Laterality: Right;   RIGHT OOPHORECTOMY     with lysis of adhesions   SHOULDER ACROMIOPLASTY Right 09/25/2017   Procedure: SHOULDER ACROMIOPLASTY;  Surgeon: Bjorn Pippin, MD;  Location: [facility];  Service: Orthopedics;  Laterality: Right;   SHOULDER ARTHROSCOPY WITH DEBRIDEMENT AND BICEP TENDON REPAIR Right 09/25/2017   Procedure: SHOULDER ARTHROSCOPY WITH DEBRIDEMENT AND BICEP TENDON REPAIR;  Surgeon: Bjorn Pippin, MD;  Location:  SURGERY CENTER;  Service: Orthopedics;  Laterality: Right;   SHOULDER ARTHROSCOPY WITH ROTATOR CUFF REPAIR Right 09/25/2017   Procedure: SHOULDER ARTHROSCOPY WITH ROTATOR CUFF REPAIR;  Surgeon: Bjorn Pippin, MD;  Location:  SURGERY CENTER;  Service: Orthopedics;  Laterality: Right;   TONSILLECTOMY  as a child   TUBAL LIGATION  12/29/2000   UPPER GASTROINTESTINAL ENDOSCOPY     Family History  Problem Relation Age of Onset   Diabetes Mother    Hypertension Mother    Hyperlipidemia Mother    Sleep apnea Mother    Thyroid disease Sister    Dementia Father    Heart attack Maternal Grandmother    Stroke Maternal Grandfather    Asthma Son    Healthy Son    Asthma Daughter    Healthy Daughter    Colon cancer Neg Hx    Colon polyps Neg Hx    Esophageal cancer Neg Hx    Rectal cancer Neg Hx    Stomach cancer Neg Hx    Social History   Tobacco Use   Smoking status: Never   Smokeless tobacco: Never  Vaping Use   Vaping Use: Never  used  Substance Use Topics   Alcohol use: Yes    Comment: occasional    Drug use: No   Current Outpatient  Medications  Medication Sig Dispense Refill   acetaminophen (TYLENOL) 500 MG tablet Take 1,000 mg by mouth every 6 (six) hours as needed for moderate pain.     albuterol (VENTOLIN HFA) 108 (90 Base) MCG/ACT inhaler TAKE 2 PUFFS BY MOUTH EVERY 6 HOURS AS NEEDED FOR WHEEZE OR SHORTNESS OF BREATH 6.7 each 11   calcium carbonate (OS-CAL - DOSED IN MG OF ELEMENTAL CALCIUM) 1250 (500 Ca) MG tablet Take 1 tablet by mouth.     cetirizine (ZYRTEC) 10 MG tablet TAKE 1 TABLET (10 MG TOTAL) BY MOUTH DAILY AS NEEDED FOR RHINITIS. 90 tablet 1   clindamycin (CLEOCIN T) 1 % lotion Apply 1 application topically daily.     EPINEPHrine (EPIPEN 2-PAK) 0.3 mg/0.3 mL IJ SOAJ injection Inject 0.3 mg into the muscle as needed for anaphylaxis. 2 each 2   esomeprazole (NEXIUM) 40 MG capsule Take 1 capsule (40 mg total) by mouth daily. 90 capsule 1   famotidine (PEPCID) 20 MG tablet Take 20 mg by mouth daily.     fluticasone furoate-vilanterol (BREO ELLIPTA) 100-25 MCG/INH AEPB Inhale 1 puff into the lungs daily as needed. 60 each 11   ondansetron (ZOFRAN) 4 MG tablet Take 1 tablet (4 mg total) by mouth daily as needed for nausea or vomiting. 30 tablet 0   RESTASIS 0.05 % ophthalmic emulsion Place 1 drop into both eyes 2 (two) times daily.     tretinoin (RETIN-A) 0.025 % cream tretinoin 0.025 % topical cream  APPLY APPLICATIONS APPLY A PEA-SIZED AMOUNT TO FACE AT BEDTIME     Vitamin D, Ergocalciferol, (DRISDOL) 1.25 MG (50000 UNIT) CAPS capsule TAKE 1 CAPSULE (50,000 UNITS TOTAL) BY MOUTH EVERY 7 (SEVEN) DAYS 13 capsule 4   No current facility-administered medications for this visit.   Allergies  Allergen Reactions   Aspirin    Ibuprofen    Salmon [Fish Allergy]    Zoledronic Acid Other (See Comments)     Review of Systems: All systems reviewed and negative except where noted in HPI.    No results  found.  Physical Exam: LMP 12/27/2003  Constitutional: Pleasant,well-developed, ***female in no acute distress. HEENT: Normocephalic and atraumatic. Conjunctivae are normal. No scleral icterus. Neck supple.  Cardiovascular: Normal rate, regular rhythm.  Pulmonary/chest: Effort normal and breath sounds normal. No wheezing, rales or rhonchi. Abdominal: Soft, nondistended, nontender. Bowel sounds active throughout. There are no masses palpable. No hepatomegaly. Extremities: no edema Lymphadenopathy: No cervical adenopathy noted. Neurological: Alert and oriented to person place and time. Skin: Skin is warm and dry. No rashes noted. Psychiatric: Normal mood and affect. Behavior is normal.   ASSESSMENT: 46 y.o. female here for assessment of the following  No diagnosis found.  PLAN:   [name], FNP

## 2021-12-06 ENCOUNTER — Telehealth: Payer: Self-pay | Admitting: Internal Medicine

## 2021-12-06 DIAGNOSIS — G4733 Obstructive sleep apnea (adult) (pediatric): Secondary | ICD-10-CM

## 2021-12-06 NOTE — Telephone Encounter (Signed)
ATC LVMTCB x 1  

## 2021-12-06 NOTE — Telephone Encounter (Signed)
Her sleep study was denied in lab by her insurance. I am ordering a home sleep study first. Please call and let her know.

## 2021-12-07 ENCOUNTER — Inpatient Hospital Stay: Payer: Commercial Managed Care - HMO | Attending: Hematology | Admitting: Hematology

## 2021-12-07 ENCOUNTER — Inpatient Hospital Stay: Payer: Commercial Managed Care - HMO

## 2021-12-07 ENCOUNTER — Other Ambulatory Visit: Payer: Self-pay

## 2021-12-07 VITALS — BP 131/82 | HR 67 | Temp 97.9°F | Resp 18 | Ht 64.0 in | Wt 244.7 lb

## 2021-12-07 DIAGNOSIS — D563 Thalassemia minor: Secondary | ICD-10-CM | POA: Diagnosis present

## 2021-12-07 DIAGNOSIS — D509 Iron deficiency anemia, unspecified: Secondary | ICD-10-CM

## 2021-12-07 DIAGNOSIS — D500 Iron deficiency anemia secondary to blood loss (chronic): Secondary | ICD-10-CM

## 2021-12-07 LAB — CMP (CANCER CENTER ONLY)
ALT: 17 U/L (ref 0–44)
AST: 22 U/L (ref 15–41)
Albumin: 4.2 g/dL (ref 3.5–5.0)
Alkaline Phosphatase: 52 U/L (ref 38–126)
Anion gap: 6 (ref 5–15)
BUN: 9 mg/dL (ref 6–20)
CO2: 29 mmol/L (ref 22–32)
Calcium: 8.8 mg/dL — ABNORMAL LOW (ref 8.9–10.3)
Chloride: 104 mmol/L (ref 98–111)
Creatinine: 0.73 mg/dL (ref 0.44–1.00)
GFR, Estimated: >60 mL/min (ref >60)
Glucose, Bld: 94 mg/dL (ref 70–99)
Potassium: 3.7 mmol/L (ref 3.5–5.1)
Sodium: 139 mmol/L (ref 135–145)
Total Bilirubin: 0.4 mg/dL (ref 0.3–1.2)
Total Protein: 7.5 g/dL (ref 6.5–8.1)

## 2021-12-07 LAB — CBC WITH DIFFERENTIAL (CANCER CENTER ONLY)
Abs Immature Granulocytes: 0.01 10*3/uL (ref 0.00–0.07)
Basophils Absolute: 0 10*3/uL (ref 0.0–0.1)
Basophils Relative: 1 %
Eosinophils Absolute: 0.1 10*3/uL (ref 0.0–0.5)
Eosinophils Relative: 1 %
HCT: 31.9 % — ABNORMAL LOW (ref 36.0–46.0)
Hemoglobin: 10.2 g/dL — ABNORMAL LOW (ref 12.0–15.0)
Immature Granulocytes: 0 %
Lymphocytes Relative: 38 %
Lymphs Abs: 1.7 10*3/uL (ref 0.7–4.0)
MCH: 24.7 pg — ABNORMAL LOW (ref 26.0–34.0)
MCHC: 32 g/dL (ref 30.0–36.0)
MCV: 77.2 fL — ABNORMAL LOW (ref 80.0–100.0)
Monocytes Absolute: 0.3 10*3/uL (ref 0.1–1.0)
Monocytes Relative: 8 %
Neutro Abs: 2.3 10*3/uL (ref 1.7–7.7)
Neutrophils Relative %: 52 %
Platelet Count: 261 10*3/uL (ref 150–400)
RBC: 4.13 MIL/uL (ref 3.87–5.11)
RDW: 14.5 % (ref 11.5–15.5)
WBC Count: 4.3 10*3/uL (ref 4.0–10.5)
nRBC: 0 % (ref 0.0–0.2)

## 2021-12-07 LAB — IRON AND IRON BINDING CAPACITY (CC-WL,HP ONLY)
Iron: 85 ug/dL (ref 28–170)
Saturation Ratios: 22 % (ref 10.4–31.8)
TIBC: 388 ug/dL (ref 250–450)
UIBC: 303 ug/dL (ref 148–442)

## 2021-12-07 LAB — FERRITIN: Ferritin: 31 ng/mL (ref 11–307)

## 2021-12-07 LAB — VITAMIN B12: Vitamin B-12: 832 pg/mL (ref 180–914)

## 2021-12-08 ENCOUNTER — Emergency Department (HOSPITAL_COMMUNITY): Payer: Commercial Managed Care - HMO

## 2021-12-08 ENCOUNTER — Emergency Department (HOSPITAL_COMMUNITY)
Admission: EM | Admit: 2021-12-08 | Discharge: 2021-12-08 | Disposition: A | Discharge status: Home / Self Care | Payer: Commercial Managed Care - HMO | Attending: Emergency Medicine | Admitting: Emergency Medicine

## 2021-12-08 ENCOUNTER — Other Ambulatory Visit: Payer: Self-pay

## 2021-12-08 ENCOUNTER — Encounter (HOSPITAL_COMMUNITY): Payer: Self-pay | Admitting: *Deleted

## 2021-12-08 DIAGNOSIS — R5381 Other malaise: Secondary | ICD-10-CM | POA: Insufficient documentation

## 2021-12-08 DIAGNOSIS — R200 Anesthesia of skin: Secondary | ICD-10-CM | POA: Diagnosis present

## 2021-12-08 DIAGNOSIS — R079 Chest pain, unspecified: Secondary | ICD-10-CM | POA: Diagnosis not present

## 2021-12-08 DIAGNOSIS — Z20822 Contact with and (suspected) exposure to covid-19: Secondary | ICD-10-CM | POA: Insufficient documentation

## 2021-12-08 DIAGNOSIS — Z79899 Other long term (current) drug therapy: Secondary | ICD-10-CM | POA: Diagnosis not present

## 2021-12-08 LAB — BASIC METABOLIC PANEL
Anion gap: 6 (ref 5–15)
BUN: 7 mg/dL (ref 6–20)
CO2: 27 mmol/L (ref 22–32)
Calcium: 9.1 mg/dL (ref 8.9–10.3)
Chloride: 105 mmol/L (ref 98–111)
Creatinine, Ser: 0.7 mg/dL (ref 0.44–1.00)
GFR, Estimated: >60 mL/min (ref >60)
Glucose, Bld: 93 mg/dL (ref 70–99)
Potassium: 3.6 mmol/L (ref 3.5–5.1)
Sodium: 138 mmol/L (ref 135–145)

## 2021-12-08 LAB — CBC WITH DIFFERENTIAL/PLATELET
Abs Immature Granulocytes: 0.01 10*3/uL (ref 0.00–0.07)
Basophils Absolute: 0 10*3/uL (ref 0.0–0.1)
Basophils Relative: 1 %
Eosinophils Absolute: 0.1 10*3/uL (ref 0.0–0.5)
Eosinophils Relative: 1 %
HCT: 31.5 % — ABNORMAL LOW (ref 36.0–46.0)
Hemoglobin: 9.8 g/dL — ABNORMAL LOW (ref 12.0–15.0)
Immature Granulocytes: 0 %
Lymphocytes Relative: 38 %
Lymphs Abs: 2.1 10*3/uL (ref 0.7–4.0)
MCH: 24.4 pg — ABNORMAL LOW (ref 26.0–34.0)
MCHC: 31.1 g/dL (ref 30.0–36.0)
MCV: 78.4 fL — ABNORMAL LOW (ref 80.0–100.0)
Monocytes Absolute: 0.5 10*3/uL (ref 0.1–1.0)
Monocytes Relative: 9 %
Neutro Abs: 2.8 10*3/uL (ref 1.7–7.7)
Neutrophils Relative %: 51 %
Platelets: 259 10*3/uL (ref 150–400)
RBC: 4.02 MIL/uL (ref 3.87–5.11)
RDW: 14.4 % (ref 11.5–15.5)
WBC: 5.4 10*3/uL (ref 4.0–10.5)
nRBC: 0 % (ref 0.0–0.2)

## 2021-12-08 LAB — URINALYSIS, ROUTINE W REFLEX MICROSCOPIC
Bilirubin Urine: NEGATIVE
Glucose, UA: NEGATIVE mg/dL
Hgb urine dipstick: NEGATIVE
Ketones, ur: NEGATIVE mg/dL
Leukocytes,Ua: NEGATIVE
Nitrite: NEGATIVE
Protein, ur: NEGATIVE mg/dL
Specific Gravity, Urine: 1.01 (ref 1.005–1.030)
pH: 6 (ref 5.0–8.0)

## 2021-12-08 LAB — PREGNANCY, URINE: Preg Test, Ur: NEGATIVE

## 2021-12-08 LAB — TROPONIN I (HIGH SENSITIVITY): Troponin I (High Sensitivity): <2 ng/L (ref <18)

## 2021-12-08 LAB — RESP PANEL BY RT-PCR (FLU A&B, COVID) ARPGX2
Influenza A by PCR: NEGATIVE
Influenza B by PCR: NEGATIVE
SARS Coronavirus 2 by RT PCR: NEGATIVE

## 2021-12-08 NOTE — ED Triage Notes (Addendum)
PT states numbness and tingling in both hand and feet for several days - intermittent.  States when she lays down she feels like her throat is closing, heart racing and chest pain.  Also c/o spasms on her L side and back.  Blood work in chart resulted from yesterday (cancer center) where she is tx for low hgb.

## 2021-12-08 NOTE — ED Notes (Signed)
Patient transported to X-ray 

## 2021-12-08 NOTE — ED Notes (Signed)
Patient states she has always been experiencing shivering.

## 2021-12-08 NOTE — Discharge Instructions (Signed)
The testing today did not show any serious problems.  Make sure you are getting plenty of rest and drink a lot of fluids.  Try to eat a regular diet.  Follow-up with your PCP if not better in 3 to 4 days.  Follow through with treatments by your hematologist.

## 2021-12-08 NOTE — ED Notes (Signed)
Patient back from  X-ray 

## 2021-12-08 NOTE — ED Provider Notes (Signed)
[facility] EMERGENCY DEPARTMENT Provider Note   CSN: [id number] Arrival date & time: 12/08/21  0932     History  Chief Complaint  Patient presents with   Numbness   Chest Pain    [name] is a 46 y.o. female.  HPI She presents for evaluation of insomnia, shivering and sweating for 2 days.  She denies any infectious symptoms including rhinorrhea, sneezing, sore throat, cough, shortness of breath, nausea, vomiting, diarrhea, dysuria or hematuria.  She saw her hematologist yesterday who plans on starting iron infusions for anemia soon.  She saw a bariatric surgeon about 5 weeks ago with plans for work-up for possible surgical management.  Home Medications Prior to Admission medications   Medication Sig Start Date End Date Taking? Authorizing Provider  acetaminophen (TYLENOL) 500 MG tablet Take 1,000 mg by mouth every 6 (six) hours as needed for moderate pain.   Yes [provider]  albuterol (VENTOLIN HFA) 108 (90 Base) MCG/ACT inhaler TAKE 2 PUFFS BY MOUTH EVERY 6 HOURS AS NEEDED FOR WHEEZE OR SHORTNESS OF BREATH Patient taking differently: Inhale 2 puffs into the lungs every 6 (six) hours as needed for wheezing or shortness of breath. 02/04/21  Yes Charlott Holler, MD  calcium carbonate (OS-CAL - DOSED IN MG OF ELEMENTAL CALCIUM) 1250 (500 Ca) MG tablet Take 1 tablet by mouth.   Yes [provider]  cetirizine (ZYRTEC) 10 MG tablet TAKE 1 TABLET (10 MG TOTAL) BY MOUTH DAILY AS NEEDED FOR RHINITIS. 06/25/21  Yes Verlee Monte, MD  clindamycin (CLEOCIN T) 1 % lotion Apply 1 application topically daily.   Yes [provider]  EPINEPHrine (EPIPEN 2-PAK) 0.3 mg/0.3 mL IJ SOAJ injection Inject 0.3 mg into the muscle as needed for anaphylaxis. 03/21/21  Yes Verlee Monte, MD  esomeprazole (NEXIUM) 40 MG capsule Take 1 capsule (40 mg total) by mouth daily. 07/19/21  Yes Armbruster, Willaim Rayas, MD  famotidine (PEPCID) 20 MG tablet Take 20 mg by  mouth daily. 01/22/20  Yes [provider]  fluticasone furoate-vilanterol (BREO ELLIPTA) 100-25 MCG/INH AEPB Inhale 1 puff into the lungs daily as needed. Patient taking differently: Inhale 1 puff into the lungs daily as needed (shortness of breath). 12/18/20  Yes Glenford Bayley, NP  Multiple Vitamin (MULTIVITAMIN WITH MINERALS) TABS tablet Take 1 tablet by mouth daily.   Yes [provider]  ondansetron (ZOFRAN) 4 MG tablet Take 1 tablet (4 mg total) by mouth daily as needed for nausea or vomiting. 04/23/21 04/23/22 Yes [name], FNP  RESTASIS 0.05 % ophthalmic emulsion Place 1 drop into both eyes 2 (two) times daily as needed (dryness). 11/04/19  Yes [provider]  tiZANidine (ZANAFLEX) 4 MG tablet Take 4 mg by mouth every 6 (six) hours as needed for muscle spasms. 11/16/21  Yes [provider]  tretinoin (RETIN-A) 0.025 % cream Apply 1 application  topically at bedtime.   Yes [provider]  Vitamin D, Ergocalciferol, (DRISDOL) 1.25 MG (50000 UNIT) CAPS capsule TAKE 1 CAPSULE (50,000 UNITS TOTAL) BY MOUTH EVERY 7 (SEVEN) DAYS 11/05/21  Yes [name], FNP      Allergies    Aspirin, Ibuprofen, Salmon [fish allergy], and Zoledronic acid    Review of Systems   Review of Systems  Physical Exam Updated Vital Signs BP 120/63   Pulse 65   Temp 98.2 F (36.8 C) (Oral)   Resp 16   Ht 5\' 4"  (1.626 m)   Wt  111 kg   LMP 12/27/2003   SpO2 98%   BMI 41.99 kg/m  Physical Exam Vitals and nursing note reviewed.  Constitutional:      General: She is not in acute distress.    Appearance: She is well-developed. She is obese. She is not ill-appearing or diaphoretic.  HENT:     Head: Normocephalic and atraumatic.     Right Ear: External ear normal.     Left Ear: External ear normal.  Eyes:     Conjunctiva/sclera: Conjunctivae normal.     Pupils: Pupils are equal, round, and reactive to light.  Neck:     Trachea: Phonation normal.   Cardiovascular:     Rate and Rhythm: Normal rate and regular rhythm.     Heart sounds: Normal heart sounds.  Pulmonary:     Effort: Pulmonary effort is normal.     Breath sounds: Normal breath sounds.  Abdominal:     General: There is no distension.     Palpations: Abdomen is soft.     Tenderness: There is no abdominal tenderness.  Musculoskeletal:        General: Normal range of motion.     Cervical back: Normal range of motion and neck supple.  Skin:    General: Skin is warm and dry.  Neurological:     Mental Status: She is alert and oriented to person, place, and time.     Cranial Nerves: No cranial nerve deficit.     Sensory: No sensory deficit.     Motor: No abnormal muscle tone.     Coordination: Coordination normal.  Psychiatric:        Mood and Affect: Mood normal.        Behavior: Behavior normal.        Thought Content: Thought content normal.        Judgment: Judgment normal.     ED Results / Procedures / Treatments   Labs (all labs ordered are listed, but only abnormal results are displayed) Labs Reviewed  CBC WITH DIFFERENTIAL/PLATELET - Abnormal; Notable for the following components:      Result Value   Hemoglobin 9.8 (*)    HCT 31.5 (*)    MCV 78.4 (*)    MCH 24.4 (*)    All other components within normal limits  URINALYSIS, ROUTINE W REFLEX MICROSCOPIC - Abnormal; Notable for the following components:   Color, Urine STRAW (*)    All other components within normal limits  RESP PANEL BY RT-PCR (FLU A&B, COVID) ARPGX2  BASIC METABOLIC PANEL  PREGNANCY, URINE  TROPONIN I (HIGH SENSITIVITY)    EKG EKG Interpretation  Date/Time:  Saturday December 08 2021 10:02:11 EDT Ventricular Rate:  67 PR Interval:  164 QRS Duration: 92 QT Interval:  414 QTC Calculation: 437 R Axis:   70 Text Interpretation: Normal sinus rhythm Normal ECG When compared with ECG of 31-Jul-2021 08:32, PREVIOUS ECG IS PRESENT since last tracing no significant change Confirmed by  Mancel Bale [phone]) on 12/08/2021 11:08:04 AM  Radiology DG Chest 2 View  Result Date: 12/08/2021 CLINICAL DATA:  Chest pain and arm numbness. EXAM: CHEST - 2 VIEW COMPARISON:  Chest radiograph 07/31/2021 FINDINGS: The cardiomediastinal silhouette is within normal limits. No airspace consolidation, edema, pleural effusion, or pneumothorax is identified. No acute osseous abnormality is seen. IMPRESSION: No active cardiopulmonary disease. Electronically Signed   By: Sebastian Ache M.D.   On: 12/08/2021 11:20    Procedures Procedures    Medications Ordered in  ED Medications - No data to display  ED Course/ Medical Decision Making/ A&P                           Medical Decision Making Patient presents for evaluation of nonspecific symptoms, possibly infection.  She has recently been evaluated to initiate treatment for anemia with iron infusions.  Also saw bariatric surgeon about 5 weeks ago.  Amount and/or Complexity of Data Reviewed Independent Historian:     Details: She is a cogent historian Labs: ordered.    Details: CBC, metabolic panel, urinalysis, pregnancy test, viral panel-normal Radiology: ordered and independent interpretation performed.    Details: Chest x-ray -- no infiltrate or edema  Risk Decision regarding hospitalization. Risk Details: Patient presenting with vague symptoms, screening evaluation is reassuring.  No evidence for acute viral or bacterial infection.  No evidence for metabolic instability or progression of anemia.  No indication for hospitalization or further ED intervention at this time.  Have encouraged the patient to rest, eat and drink regularly and follow-up with PCP, as needed.           Final Clinical Impression(s) / ED Diagnoses Final diagnoses:  Malaise    Rx / DC Orders ED Discharge Orders     None         Mancel Bale, MD 12/08/21 1340

## 2021-12-10 ENCOUNTER — Telehealth: Payer: Self-pay | Admitting: Hematology

## 2021-12-10 NOTE — Telephone Encounter (Signed)
Scheduled per 08/04 los, patient has been called and notified of upcoming appointments. 

## 2021-12-11 NOTE — Progress Notes (Signed)
[employer] [facility] Weekly Session  Patient Details  Name: [name] MRN: [id number] Date of Birth: 1975-10-24 Age: 46 y.o. PCP: [name], FNP  Vitals:   12/10/21 1430  Weight: 240 lb (108.9 kg)     [employer] Weekly seesion - 12/11/21 1700       [employer] [facility]PRE[facility]" Location   [facility] [facility] [employer]      Weekly Session   Topic Discussed Restaurant Eating   salt and sugar demo   Minutes exercised this week 100 minutes    Classes attended to date 6             Bonnye Fava 12/11/2021, 5:06 PM

## 2021-12-12 ENCOUNTER — Encounter (INDEPENDENT_AMBULATORY_CARE_PROVIDER_SITE_OTHER): Payer: Self-pay

## 2021-12-13 ENCOUNTER — Other Ambulatory Visit: Payer: Self-pay | Admitting: Hematology and Oncology

## 2021-12-14 ENCOUNTER — Encounter: Payer: Self-pay | Admitting: Hematology

## 2021-12-14 ENCOUNTER — Inpatient Hospital Stay (HOSPITAL_BASED_OUTPATIENT_CLINIC_OR_DEPARTMENT_OTHER): Payer: Commercial Managed Care - HMO

## 2021-12-14 ENCOUNTER — Other Ambulatory Visit: Payer: Self-pay

## 2021-12-14 VITALS — BP 107/68 | HR 62 | Temp 98.1°F | Resp 18

## 2021-12-14 DIAGNOSIS — D500 Iron deficiency anemia secondary to blood loss (chronic): Secondary | ICD-10-CM

## 2021-12-14 DIAGNOSIS — D509 Iron deficiency anemia, unspecified: Secondary | ICD-10-CM

## 2021-12-14 MED ORDER — SODIUM CHLORIDE 0.9 % IV SOLN: Freq: Once | INTRAVENOUS | Status: AC

## 2021-12-14 MED ORDER — LORATADINE 10 MG PO TABS
10.0000 mg | ORAL_TABLET | Freq: Once | ORAL | Status: AC
  Administered 2021-12-14: 10 mg via ORAL

## 2021-12-14 MED ORDER — ACETAMINOPHEN 325 MG PO TABS
650.0000 mg | ORAL_TABLET | Freq: Once | ORAL | Status: AC
  Administered 2021-12-14: 650 mg via ORAL

## 2021-12-14 MED ORDER — SODIUM CHLORIDE 0.9 % IV SOLN
300.0000 mg | Freq: Once | INTRAVENOUS | Status: AC
  Administered 2021-12-14: 300 mg via INTRAVENOUS
  Filled 2021-12-14: qty 300

## 2021-12-14 NOTE — Progress Notes (Signed)
HEMATOLOGY/ONCOLOGY CLINIC NOTE  Date of Service: 12/07/2021   Patient Care Team: [name], FNP as PCP - General (General Practice) Chilton Si, MD as PCP - Cardiology (Cardiology) Drema Dallas, DO as Consulting Physician (Neurology)  CHIEF COMPLAINTS/PURPOSE OF CONSULTATION:  Patient has been referred to [place] for continued valuation and management of iron deficiency anemia  HISTORY OF PRESENTING ILLNESS:   [name] is a wonderful 46 y.o. female who has been referred to [place] by Dr. Adela Lank for evaluation and management of iron deficiency anemia. The pt reports that she is doing well overall.   The pt reports that she has been anemic for as long as she can remember. Dr. Adela Lank completed an Upper Endoscopy earlier this year to work up the cause for her anemia. No cause has been found yet. Pt also had a Colonoscopy in 2018 and a Capsule Endoscopy in 2019. She denies significant hemorrhoidal bleeding. Pt stopped having menstrual cycles in 2005 after having a partial hysterectomy for Endometriosis. She does not take much OTC pain medication as it upsets her stomach. Pt is currently taking high-dose Vitamin D twice per week and Calcium daily. She does not have thorough knowledge of her paternal family history, but is not aware of any family history of blood disorders or cancers.   Pt is having pain around her ear, right-sided jaw pain, dry mouth, dry eyes & burning sensation mainly in her right eye. She has tried eye lubricants and steroidal eye drops to no avail. Pt has had these symptoms evaluated by ENT & Optometrist, who found no cause. These symptoms have been intermittent for the last month. Pt also reports recent increase in urinary urgency, SOB, and new heart palpitations. She has had difficulty swallowing since 2018, which has not improved since dilation was performed during the latest Endoscopy. Pt was recently on Reclast but has been off for a few months. Pt has  been using OTC Ferrous Sulfate and has experienced constipation.    Most recent lab results (02/16/2020) of CBC is as follows: all values are WNL except for Hgb at 10.7, HCT at 33.5, MCV at 75.6, RDW at 15.7. 02/16/2020 IBC + Ferritin is as follows: Iron at 58, TIBC at 257.0, Sat Ratios at 16.1, Ferritin at 53.5.  On review of systems, pt reports tingling/numbness in hands/feet, discomfort in mastoid area, jaw pain, dry mouth, dry eyes, ankle pain, urinary urgency, SOB, palpitations, constipation and denies joint swelling, rash, mouth sores, abdominal pain, abnormal/excessive bleeding and any other symptoms.   On PMHx the pt reports Endometriosis, Vitamin D deficiency, Osteoporosis, Inappropriate sinus tachycardia, Back pain, Anemia, Partial hysterectomy.  Interval History  [name] is here for continued evaluation and management of iron deficiency anemia. She has developed some mild anemia with a hemoglobin of 10.2 with an MCV of 77.2.  Normal WBC count and platelets. She notes fatigue and would like consideration of IV iron. Notes no acute chest pain or shortness of breath.  No acute abdominal pain or distention. Labs done today were reviewed with the patient in detail.  MEDICAL HISTORY: Past Medical History:  Diagnosis Date   AKI (acute kidney injury) (HCC) 10/06/2020   Allergy    Anemia    Angio-edema    Anxiety    Apnea 12/08/2019   Arthritis    Asthma    Back pain    Constipation    Exertional dyspnea 03/14/2020   Fatty liver    GERD (gastroesophageal reflux disease)  H/O: hysterectomy    Hx of endometriosis    Inappropriate sinus tachycardia 12/08/2019   Insomnia    Joint pain    Lactose intolerance    Macromastia 07/2011   Migraine    Morbid obesity (HCC) 12/08/2019   Osteoporosis    Urticaria    Vitamin D deficiency     SURGICAL HISTORY: Past Surgical History:  Procedure Laterality Date   6 HOUR PH STUDY N/A 01/12/2018   Procedure: 24 HOUR PH STUDY;   Surgeon: Napoleon Form, MD;  Location: [facility];  Service: Endoscopy;  Laterality: N/A;   ABDOMINAL HYSTERECTOMY  12/12/2003   ADENOIDECTOMY  05/2011   ANKLE ARTHROSCOPY  12/29/2007   right; with extensive debridement   BLADDER NECK RECONSTRUCTION  01/14/2011   Procedure: BLADDER NECK REPAIR;  Surgeon: Reva Bores, MD;  Location: [facility];  Service: Gynecology;  Laterality: N/A;  Laparoscopic Repair of Incidental Cystotomy   BREAST REDUCTION SURGERY  08/05/2011   Procedure: MAMMARY REDUCTION  (BREAST);  Surgeon: Louisa Second, MD;  Location: [facility];  Service: Plastics;  Laterality: Bilateral;  bilateral   BREAST SURGERY  2012   breast reduction   CESAREAN SECTION  12/29/2000; 1994   DILATION AND CURETTAGE OF UTERUS  07/08/2003   open laparoscopy   ESOPHAGEAL MANOMETRY N/A 01/12/2018   Procedure: ESOPHAGEAL MANOMETRY (EM);  Surgeon: Napoleon Form, MD;  Location: [facility];  Service: Endoscopy;  Laterality: N/A;   GIVENS CAPSULE STUDY N/A 06/23/2017   Procedure: GIVENS CAPSULE STUDY;  Surgeon: Benancio Deeds, MD;  Location: [facility];  Service: Gastroenterology;  Laterality: N/A;   LAPAROSCOPIC SALPINGOOPHERECTOMY  01/14/2011   left   PH IMPEDANCE STUDY N/A 01/12/2018   Procedure: PH IMPEDANCE STUDY;  Surgeon: Napoleon Form, MD;  Location: [facility];  Service: Endoscopy;  Laterality: N/A;   REPAIR PERONEAL TENDONS ANKLE  01/03/2010   repair right subluxing peroneal tendons   RESECTION DISTAL CLAVICAL Right 09/25/2017   Procedure: RESECTION DISTAL CLAVICAL;  Surgeon: Bjorn Pippin, MD;  Location: [facility];  Service: Orthopedics;  Laterality: Right;   RIGHT OOPHORECTOMY     with lysis of adhesions   SHOULDER ACROMIOPLASTY Right 09/25/2017   Procedure: SHOULDER ACROMIOPLASTY;  Surgeon: Bjorn Pippin, MD;  Location: [facility];  Service: Orthopedics;  Laterality: Right;   SHOULDER ARTHROSCOPY WITH DEBRIDEMENT AND BICEP  TENDON REPAIR Right 09/25/2017   Procedure: SHOULDER ARTHROSCOPY WITH DEBRIDEMENT AND BICEP TENDON REPAIR;  Surgeon: Bjorn Pippin, MD;  Location:  SURGERY CENTER;  Service: Orthopedics;  Laterality: Right;   SHOULDER ARTHROSCOPY WITH ROTATOR CUFF REPAIR Right 09/25/2017   Procedure: SHOULDER ARTHROSCOPY WITH ROTATOR CUFF REPAIR;  Surgeon: Bjorn Pippin, MD;  Location:  SURGERY CENTER;  Service: Orthopedics;  Laterality: Right;   TONSILLECTOMY  as a child   TUBAL LIGATION  12/29/2000   UPPER GASTROINTESTINAL ENDOSCOPY      SOCIAL HISTORY: Social History   Socioeconomic History   Marital status: Divorced    Spouse name: Not on file   Number of children: 2   Years of education: 12th   Highest education level: Not on file  Occupational History   Occupation: Facilities manager: OTHER    Comment: [employer]  Tobacco Use   Smoking status: Never   Smokeless tobacco: Never  Vaping Use   Vaping Use: Never used  Substance and Sexual Activity   Alcohol use: Yes  Comment: occasional    Drug use: No   Sexual activity: Not on file  Other Topics Concern   Not on file  Social History Narrative   Pt lives in 2 story town home with her son   Has 2 children   High school Paramedicgraduate   Bus driver for [employer]   Right handed   Social Determinants of Health   Financial Resource Strain: Not on file  Food Insecurity: No Food Insecurity (12/27/2020)   Hunger Vital Sign    Worried About Running Out of Food in the Last Year: Never true    Ran Out of Food in the Last Year: Never true  Transportation Needs: No Transportation Needs (12/27/2020)   PRAPARE - Administrator, Civil ServiceTransportation    Lack of Transportation (Medical): No    Lack of Transportation (Non-Medical): No  Physical Activity: Not on file  Stress: Not on file  Social Connections: Not on file  Intimate Partner Violence: Not on file    FAMILY HISTORY: Family History  Problem Relation Age of Onset   Diabetes Mother     Hypertension Mother    Hyperlipidemia Mother    Sleep apnea Mother    Thyroid disease Sister    Dementia Father    Heart attack Maternal Grandmother    Stroke Maternal Grandfather    Asthma Son    Healthy Son    Asthma Daughter    Healthy Daughter    Colon cancer Neg Hx    Colon polyps Neg Hx    Esophageal cancer Neg Hx    Rectal cancer Neg Hx    Stomach cancer Neg Hx     ALLERGIES:  is allergic to aspirin, ibuprofen, salmon [fish allergy], and zoledronic acid.  MEDICATIONS:  Current Outpatient Medications  Medication Sig Dispense Refill   acetaminophen (TYLENOL) 500 MG tablet Take 1,000 mg by mouth every 6 (six) hours as needed for moderate pain.     albuterol (VENTOLIN HFA) 108 (90 Base) MCG/ACT inhaler TAKE 2 PUFFS BY MOUTH EVERY 6 HOURS AS NEEDED FOR WHEEZE OR SHORTNESS OF BREATH (Patient taking differently: Inhale 2 puffs into the lungs every 6 (six) hours as needed for wheezing or shortness of breath.) 6.7 each 11   calcium carbonate (OS-CAL - DOSED IN MG OF ELEMENTAL CALCIUM) 1250 (500 Ca) MG tablet Take 1 tablet by mouth.     cetirizine (ZYRTEC) 10 MG tablet TAKE 1 TABLET (10 MG TOTAL) BY MOUTH DAILY AS NEEDED FOR RHINITIS. 90 tablet 1   clindamycin (CLEOCIN T) 1 % lotion Apply 1 application topically daily.     EPINEPHrine (EPIPEN 2-PAK) 0.3 mg/0.3 mL IJ SOAJ injection Inject 0.3 mg into the muscle as needed for anaphylaxis. 2 each 2   esomeprazole (NEXIUM) 40 MG capsule Take 1 capsule (40 mg total) by mouth daily. 90 capsule 1   famotidine (PEPCID) 20 MG tablet Take 20 mg by mouth daily.     fluticasone furoate-vilanterol (BREO ELLIPTA) 100-25 MCG/INH AEPB Inhale 1 puff into the lungs daily as needed. (Patient taking differently: Inhale 1 puff into the lungs daily as needed (shortness of breath).) 60 each 11   Multiple Vitamin (MULTIVITAMIN WITH MINERALS) TABS tablet Take 1 tablet by mouth daily.     ondansetron (ZOFRAN) 4 MG tablet Take 1 tablet (4 mg total) by mouth  daily as needed for nausea or vomiting. 30 tablet 0   RESTASIS 0.05 % ophthalmic emulsion Place 1 drop into both eyes 2 (two) times daily as needed (dryness).  tiZANidine (ZANAFLEX) 4 MG tablet Take 4 mg by mouth every 6 (six) hours as needed for muscle spasms.     tretinoin (RETIN-A) 0.025 % cream Apply 1 application  topically at bedtime.     Vitamin D, Ergocalciferol, (DRISDOL) 1.25 MG (50000 UNIT) CAPS capsule TAKE 1 CAPSULE (50,000 UNITS TOTAL) BY MOUTH EVERY 7 (SEVEN) DAYS 13 capsule 4   No current facility-administered medications for this visit.    REVIEW OF SYSTEMS:   10 Point review of Systems was done is negative except as noted above.  PHYSICAL EXAMINATION: ECOG PERFORMANCE STATUS: 1 - Symptomatic but completely ambulatory  . Vitals:   12/07/21 1117  BP: 131/82  Pulse: 67  Resp: 18  Temp: 97.9 F (36.6 C)  SpO2: 100%   Filed Weights   12/07/21 1117  Weight: 244 lb 11.2 oz (111 kg)   .Body mass index is 42 kg/m. NAD GENERAL:alert, in no acute distress and comfortable SKIN: no acute rashes, no significant lesions EYES: conjunctiva are pink and non-injected, sclera anicteric OROPHARYNX: MMM, no exudates, no oropharyngeal erythema or ulceration NECK: supple, no JVD LYMPH:  no palpable lymphadenopathy in the cervical, axillary or inguinal regions LUNGS: clear to auscultation b/l with normal respiratory effort HEART: regular rate & rhythm ABDOMEN:  normoactive bowel sounds , non tender, not distended. Extremity: no pedal edema PSYCH: alert & oriented x 3 with fluent speech NEURO: no focal motor/sensory deficits   LABORATORY DATA:  I have reviewed the data as listed  .    Latest Ref Rng & Units 12/08/2021   10:04 AM 12/07/2021   12:24 PM 07/31/2021    7:32 AM  CBC  WBC 4.0 - 10.5 K/uL 5.4  4.3  4.4   Hemoglobin 12.0 - 15.0 g/dL 9.8  96.7  9.8   Hematocrit 36.0 - 46.0 % 31.5  31.9  32.0   Platelets 150 - 400 K/uL 259  261  280    . CBC    Component  Value Date/Time   WBC 5.4 12/08/2021 1004   RBC 4.02 12/08/2021 1004   HGB 9.8 (L) 12/08/2021 1004   HGB 10.2 (L) 12/07/2021 1224   HGB 11.2 04/23/2021 1549   HCT 31.5 (L) 12/08/2021 1004   HCT 35.3 04/23/2021 1549   PLT 259 12/08/2021 1004   PLT 261 12/07/2021 1224   PLT 307 04/23/2021 1549   MCV 78.4 (L) 12/08/2021 1004   MCV 77 (L) 04/23/2021 1549   MCH 24.4 (L) 12/08/2021 1004   MCHC 31.1 12/08/2021 1004   RDW 14.4 12/08/2021 1004   RDW 12.0 04/23/2021 1549   LYMPHSABS 2.1 12/08/2021 1004   LYMPHSABS 1.9 01/04/2020 1046   MONOABS 0.5 12/08/2021 1004   EOSABS 0.1 12/08/2021 1004   EOSABS 0.1 01/04/2020 1046   BASOSABS 0.0 12/08/2021 1004   BASOSABS 0.0 01/04/2020 1046    .    Latest Ref Rng & Units 12/08/2021   10:04 AM 12/07/2021   12:24 PM 07/31/2021    7:32 AM  CMP  Glucose 70 - 99 mg/dL 93  94  893   BUN 6 - 20 mg/dL 7  9  9    Creatinine 0.44 - 1.00 mg/dL  8.10  1.75   Sodium 135 - 145 mmol/L 138  139  138   Potassium 3.5 - 5.1 mmol/L 3.6  3.7  3.7   Chloride 98 - 111 mmol/L 105  104  105   CO2 22 - 32 mmol/L 27  29  28   Calcium 8.9 - 10.3 mg/dL 9.1  8.8  9.2   Total Protein 6.5 - 8.1 g/dL  7.5  7.7   Total Bilirubin 0.3 - 1.2 mg/dL  0.4  0.2   Alkaline Phos 38 - 126 U/L  52  55   AST 15 - 41 U/L  22  22   ALT 0 - 44 U/L  17  21    . Lab Results  Component Value Date   IRON 85 12/07/2021   TIBC 388 12/07/2021   IRONPCTSAT 22 12/07/2021   (Iron and TIBC)  Lab Results  Component Value Date   FERRITIN 31 12/07/2021   Alpha-Thalassemia GenotypR Order: [id number] Status: Final result   Visible to patient: Yes (seen)   Next appt: 06/19/2020 at 10:40 AM in Family Medicine Gavin Pound Opalski, DO)   Dx: Microcytic anemia   0 Result Notes  Component 3 mo ago  Alpha-Thalassemia Comment:   Comment: (NOTE)  Test: Alpha-Thalassemia, DNA Analysis  Result:      Alpha-+-thalassemia trait, alpha alpha/alpha-  Mutation(s) identified: alpha3.7   Interpretation:  This result is most consistent with this individual being an  unaffected carrier of alpha-thalassemia with a single gene  deletion, also called alpha-+-thalassemia trait.       RADIOGRAPHIC STUDIES: I have personally reviewed the radiological images as listed and agreed with the findings in the report. DG Chest 2 View  Result Date: 12/08/2021 CLINICAL DATA:  Chest pain and arm numbness. EXAM: CHEST - 2 VIEW COMPARISON:  Chest radiograph 07/31/2021 FINDINGS: The cardiomediastinal silhouette is within normal limits. No airspace consolidation, edema, pleural effusion, or pneumothorax is identified. No acute osseous abnormality is seen. IMPRESSION: No active cardiopulmonary disease. Electronically Signed   By: Sebastian Ache M.D.   On: 12/08/2021 11:20    ASSESSMENT & PLAN:   46 yo female with   1) Microcytic Anemia due to iron deficiency 2) Alpha thalassemia trait  PLAN: -I discussed available labs with the patient.  She is having some developing anemia which is multifactorial but primarily from iron deficiency.  She also has some microcytosis from her alpha thalassemia trait.  Some of the mild anemia could also be anemia of chronic inflammation due to any inflammatory issues in the body. -We discussed in detail and patient is agreeable with proceeding with IV iron we will set her up for IV Venofer 300 mg weekly x 3 doses -She was also recommended to take over-the-counter B complex 1 capsule p.o. daily. -She will continue to monitor for GI bleeding and will call her gastroenterologist for any concerns regarding GI bleeding -Recommended eating iron rich foods -We discussed that her goal is to keep her ferritin more than 50 closer to 100 and iron saturation more than 20%. -Continue follow-up with PCP   FOLLOW UP: s today IV Venofer 300mg  weekly x 3 doses RTC with Dr with labs in 4 months  The total time spent in the appointment was 21 minutes*.  All of the  patient's questions were answered with apparent satisfaction. The patient knows to call the clinic with any problems, questions or concerns.   Candise Che MD MS AAHIVMS [facility] Hematology/Oncology Physician [facility]  .*Total Encounter Time as defined by the Centers for Medicare and Medicaid Services includes, in addition to the face-to-face time of a patient visit (documented in the note above) non-face-to-face time: obtaining and reviewing outside history, ordering and reviewing medications, tests or procedures, care coordination (communications with  other health care professionals or caregivers) and documentation in the medical record.

## 2021-12-14 NOTE — Patient Instructions (Signed)

## 2021-12-18 NOTE — Progress Notes (Signed)
[facility] PRE[facility] Weekly Session  Patient Details  Name: [name] MRN: [id number] Date of Birth: [birth year] Age: 46 y.o. PCP: [name], FNP  Vitals:   12/18/21 1430  Weight: 239 lb (108.4 kg)     [facility] Weekly seesion - 12/18/21 1700       [facility] "PREP" Location   [facility] "PREP" [facility]      Weekly Session   Topic Discussed Stress management and problem solving    Minutes exercised this week 100 minutes    Classes attended to date 24             Pam Jerral Bonito 12/18/2021, 5:12 PM

## 2021-12-21 ENCOUNTER — Inpatient Hospital Stay: Payer: Commercial Managed Care - HMO

## 2021-12-21 ENCOUNTER — Other Ambulatory Visit: Payer: Self-pay

## 2021-12-21 VITALS — BP 111/77 | HR 73 | Temp 98.1°F | Resp 18

## 2021-12-21 DIAGNOSIS — D500 Iron deficiency anemia secondary to blood loss (chronic): Secondary | ICD-10-CM

## 2021-12-21 DIAGNOSIS — D509 Iron deficiency anemia, unspecified: Secondary | ICD-10-CM | POA: Diagnosis not present

## 2021-12-21 MED ORDER — SODIUM CHLORIDE 0.9 % IV SOLN: Freq: Once | INTRAVENOUS | Status: AC

## 2021-12-21 MED ORDER — SODIUM CHLORIDE 0.9 % IV SOLN
300.0000 mg | Freq: Once | INTRAVENOUS | Status: AC
  Administered 2021-12-21: 300 mg via INTRAVENOUS
  Filled 2021-12-21: qty 300

## 2021-12-21 MED ORDER — ACETAMINOPHEN 325 MG PO TABS
650.0000 mg | ORAL_TABLET | Freq: Once | ORAL | Status: AC
  Administered 2021-12-21: 650 mg via ORAL
  Filled 2021-12-21: qty 2

## 2021-12-21 MED ORDER — LORATADINE 10 MG PO TABS
10.0000 mg | ORAL_TABLET | Freq: Once | ORAL | Status: AC
  Administered 2021-12-21: 10 mg via ORAL
  Filled 2021-12-21: qty 1

## 2021-12-21 NOTE — Patient Instructions (Signed)

## 2021-12-28 ENCOUNTER — Other Ambulatory Visit: Payer: Self-pay

## 2021-12-28 ENCOUNTER — Inpatient Hospital Stay: Payer: Commercial Managed Care - HMO

## 2021-12-28 VITALS — BP 126/89 | HR 64 | Temp 98.2°F | Resp 18

## 2021-12-28 DIAGNOSIS — D500 Iron deficiency anemia secondary to blood loss (chronic): Secondary | ICD-10-CM

## 2021-12-28 DIAGNOSIS — D509 Iron deficiency anemia, unspecified: Secondary | ICD-10-CM | POA: Diagnosis not present

## 2021-12-28 MED ORDER — ALTEPLASE 2 MG IJ SOLR: 2.0000 mg | Freq: Once | INTRAMUSCULAR | Status: DC | PRN

## 2021-12-28 MED ORDER — SODIUM CHLORIDE 0.9 % IV SOLN: Freq: Once | INTRAVENOUS | Status: AC

## 2021-12-28 MED ORDER — SODIUM CHLORIDE 0.9% FLUSH: 3.0000 mL | Freq: Once | INTRAVENOUS | Status: DC | PRN

## 2021-12-28 MED ORDER — ACETAMINOPHEN 325 MG PO TABS
650.0000 mg | ORAL_TABLET | Freq: Once | ORAL | Status: AC
  Administered 2021-12-28: 650 mg via ORAL
  Filled 2021-12-28: qty 2

## 2021-12-28 MED ORDER — HEPARIN SOD (PORK) LOCK FLUSH 100 UNIT/ML IV SOLN: 250.0000 [IU] | Freq: Once | INTRAVENOUS | Status: DC | PRN

## 2021-12-28 MED ORDER — HEPARIN SOD (PORK) LOCK FLUSH 100 UNIT/ML IV SOLN: 500.0000 [IU] | Freq: Once | INTRAVENOUS | Status: DC | PRN

## 2021-12-28 MED ORDER — LORATADINE 10 MG PO TABS
10.0000 mg | ORAL_TABLET | Freq: Once | ORAL | Status: AC
  Administered 2021-12-28: 10 mg via ORAL
  Filled 2021-12-28: qty 1

## 2021-12-28 MED ORDER — SODIUM CHLORIDE 0.9% FLUSH: 10.0000 mL | Freq: Once | INTRAVENOUS | Status: DC | PRN

## 2021-12-28 MED ORDER — SODIUM CHLORIDE 0.9 % IV SOLN
300.0000 mg | Freq: Once | INTRAVENOUS | Status: AC
  Administered 2021-12-28: 300 mg via INTRAVENOUS
  Filled 2021-12-28: qty 300

## 2021-12-28 NOTE — Progress Notes (Signed)
Pt declined to stay for 30 minute observation period post venofer infusion. VSS. No complaints at time of discharge.  

## 2021-12-28 NOTE — Patient Instructions (Signed)

## 2022-01-08 ENCOUNTER — Other Ambulatory Visit: Payer: Self-pay | Admitting: Gastroenterology

## 2022-01-10 ENCOUNTER — Encounter: Payer: Self-pay | Admitting: Internal Medicine

## 2022-01-10 ENCOUNTER — Telehealth: Payer: Self-pay | Admitting: Internal Medicine

## 2022-01-11 ENCOUNTER — Ambulatory Visit: Payer: Commercial Managed Care - HMO | Admitting: Hematology

## 2022-01-11 NOTE — Telephone Encounter (Signed)
Called [name] back and advised her that home sleep test was placed on 12/06/2021. Currently, our home sleep tests are about 6-8 weeks behind. And that the patient should be hearing from our [facility] in a few weeks to have this scheduled! Nothing further needed

## 2022-01-15 ENCOUNTER — Ambulatory Visit: Payer: Commercial Managed Care - HMO | Admitting: Skilled Nursing Facility1

## 2022-01-16 ENCOUNTER — Other Ambulatory Visit: Payer: Self-pay | Admitting: Anesthesiology

## 2022-01-16 ENCOUNTER — Ambulatory Visit
Admission: RE | Admit: 2022-01-16 | Discharge: 2022-01-16 | Disposition: A | Discharge status: Home / Self Care | Payer: Commercial Managed Care - HMO | Source: Ambulatory Visit | Attending: Anesthesiology | Admitting: Anesthesiology

## 2022-01-16 DIAGNOSIS — M1711 Unilateral primary osteoarthritis, right knee: Secondary | ICD-10-CM

## 2022-01-18 ENCOUNTER — Other Ambulatory Visit: Payer: Self-pay

## 2022-01-18 DIAGNOSIS — D509 Iron deficiency anemia, unspecified: Secondary | ICD-10-CM

## 2022-01-23 NOTE — Progress Notes (Signed)
[facility] PRE[facility] Weekly Session  Patient Details  Name: [name] MRN: [id number] Date of Birth: 12/06/1975 Age: 46 y.o. PCP: [name], FNP  Vitals:   01/21/22 1430  Weight: 240 lb (108.9 kg)     [facility] Weekly seesion - 01/23/22 1300       [facility] "PREP" Location   [facility] "PREP" Location [facility]      Weekly Session   Topic Discussed Finding support    Minutes exercised this week 200 minutes    Classes attended to date 39             [place] 01/23/2022, 1:24 PM

## 2022-01-28 ENCOUNTER — Inpatient Hospital Stay: Payer: Commercial Managed Care - HMO | Attending: Hematology

## 2022-01-28 ENCOUNTER — Other Ambulatory Visit: Payer: Self-pay

## 2022-01-28 ENCOUNTER — Inpatient Hospital Stay: Payer: Commercial Managed Care - HMO

## 2022-01-28 DIAGNOSIS — D509 Iron deficiency anemia, unspecified: Secondary | ICD-10-CM | POA: Diagnosis not present

## 2022-01-28 LAB — CBC WITH DIFFERENTIAL (CANCER CENTER ONLY)
Abs Immature Granulocytes: 0.01 K/uL (ref 0.00–0.07)
Basophils Absolute: 0 K/uL (ref 0.0–0.1)
Basophils Relative: 0 %
Eosinophils Absolute: 0.1 K/uL (ref 0.0–0.5)
Eosinophils Relative: 1 %
HCT: 34.9 % — ABNORMAL LOW (ref 36.0–46.0)
Hemoglobin: 11 g/dL — ABNORMAL LOW (ref 12.0–15.0)
Immature Granulocytes: 0 %
Lymphocytes Relative: 46 %
Lymphs Abs: 2.1 K/uL (ref 0.7–4.0)
MCH: 24.7 pg — ABNORMAL LOW (ref 26.0–34.0)
MCHC: 31.5 g/dL (ref 30.0–36.0)
MCV: 78.3 fL — ABNORMAL LOW (ref 80.0–100.0)
Monocytes Absolute: 0.4 K/uL (ref 0.1–1.0)
Monocytes Relative: 8 %
Neutro Abs: 2.1 K/uL (ref 1.7–7.7)
Neutrophils Relative %: 45 %
Platelet Count: 254 K/uL (ref 150–400)
RBC: 4.46 MIL/uL (ref 3.87–5.11)
RDW: 13.9 % (ref 11.5–15.5)
WBC Count: 4.7 K/uL (ref 4.0–10.5)
nRBC: 0 % (ref 0.0–0.2)

## 2022-01-28 LAB — CMP (CANCER CENTER ONLY)
ALT: 22 U/L (ref 0–44)
AST: 23 U/L (ref 15–41)
Albumin: 4.1 g/dL (ref 3.5–5.0)
Alkaline Phosphatase: 61 U/L (ref 38–126)
Anion gap: 4 — ABNORMAL LOW (ref 5–15)
BUN: 10 mg/dL (ref 6–20)
CO2: 28 mmol/L (ref 22–32)
Calcium: 8.9 mg/dL (ref 8.9–10.3)
Chloride: 105 mmol/L (ref 98–111)
Creatinine: 0.68 mg/dL (ref 0.44–1.00)
GFR, Estimated: >60 mL/min
Glucose, Bld: 98 mg/dL (ref 70–99)
Potassium: 3.9 mmol/L (ref 3.5–5.1)
Sodium: 137 mmol/L (ref 135–145)
Total Bilirubin: 0.3 mg/dL (ref 0.3–1.2)
Total Protein: 7.3 g/dL (ref 6.5–8.1)

## 2022-01-28 LAB — IRON AND IRON BINDING CAPACITY (CC-WL,HP ONLY)
Iron: 80 ug/dL (ref 28–170)
Saturation Ratios: 27 % (ref 10.4–31.8)
TIBC: 298 ug/dL (ref 250–450)
UIBC: 218 ug/dL (ref 148–442)

## 2022-01-28 LAB — VITAMIN B12: Vitamin B-12: 614 pg/mL (ref 180–914)

## 2022-01-28 LAB — FERRITIN: Ferritin: 136 ng/mL (ref 11–307)

## 2022-01-29 NOTE — Progress Notes (Signed)
[facility] PREP Weekly Session  Patient Details  Name: [name] MRN: [id number] Date of Birth: 1975/11/14 Age: 46 y.o. PCP: [name], FNP  Vitals:   01/28/22 1430  Weight: 240 lb (108.9 kg)     [facility] Weekly seesion - 01/29/22 1200       [facility] "PREP" Location   [facility] "PREP" [employer] [facility]      Weekly Session   Topic Discussed Other   carbs, proteins and fats   Minutes exercised this week 180 minutes    Classes attended to date 17             Barnett Hatter 01/29/2022, 12:41 PM

## 2022-01-31 ENCOUNTER — Other Ambulatory Visit: Payer: Self-pay | Admitting: Gastroenterology

## 2022-02-04 ENCOUNTER — Inpatient Hospital Stay: Payer: Commercial Managed Care - HMO | Attending: Hematology | Admitting: Hematology

## 2022-02-04 ENCOUNTER — Ambulatory Visit: Payer: Commercial Managed Care - HMO

## 2022-02-04 DIAGNOSIS — D500 Iron deficiency anemia secondary to blood loss (chronic): Secondary | ICD-10-CM

## 2022-02-04 DIAGNOSIS — G4733 Obstructive sleep apnea (adult) (pediatric): Secondary | ICD-10-CM

## 2022-02-04 DIAGNOSIS — G4710 Hypersomnia, unspecified: Secondary | ICD-10-CM | POA: Diagnosis not present

## 2022-02-04 DIAGNOSIS — D509 Iron deficiency anemia, unspecified: Secondary | ICD-10-CM | POA: Insufficient documentation

## 2022-02-05 ENCOUNTER — Telehealth: Payer: Self-pay | Admitting: Pulmonary Disease

## 2022-02-05 DIAGNOSIS — G4710 Hypersomnia, unspecified: Secondary | ICD-10-CM | POA: Diagnosis not present

## 2022-02-05 NOTE — Telephone Encounter (Signed)
I called the patient and gave her the results and she voices understanding.Nothing further needed.  

## 2022-02-05 NOTE — Telephone Encounter (Signed)
Call patient  Sleep study result  Date of study: 02/04/2022  Impression: Negative study for significant obstructive sleep apnea No significant oxygen desaturations  Recommendation:  Clinical monitoring of symptoms  If there remains significant concern for sleep disordered breathing, an in lab study may be considered  Weight loss measures  Optimize sleep hygiene

## 2022-02-10 ENCOUNTER — Encounter: Payer: Self-pay | Admitting: Hematology

## 2022-02-10 NOTE — Progress Notes (Signed)
HEMATOLOGY/ONCOLOGY PHONE VISIT NOTE  Date of Service: 02/04/2022   Patient Care Team: [name], FNP as PCP - General (General Practice) Chilton Si, MD as PCP - Cardiology (Cardiology) Drema Dallas, DO as Consulting Physician (Neurology)  CHIEF COMPLAINTS/PURPOSE OF CONSULTATION:  Patient has been referred to [place] for continued valuation and management of iron deficiency anemia  HISTORY OF PRESENTING ILLNESS:   [name] is a wonderful 46 y.o. female who has been referred to [place] by Dr. Adela Lank for evaluation and management of iron deficiency anemia. The pt reports that she is doing well overall.   The pt reports that she has been anemic for as long as she can remember. Dr. Adela Lank completed an Upper Endoscopy earlier this year to work up the cause for her anemia. No cause has been found yet. Pt also had a Colonoscopy in 2018 and a Capsule Endoscopy in 2019. She denies significant hemorrhoidal bleeding. Pt stopped having menstrual cycles in 2005 after having a partial hysterectomy for Endometriosis. She does not take much OTC pain medication as it upsets her stomach. Pt is currently taking high-dose Vitamin D twice per week and Calcium daily. She does not have thorough knowledge of her paternal family history, but is not aware of any family history of blood disorders or cancers.   Pt is having pain around her ear, right-sided jaw pain, dry mouth, dry eyes & burning sensation mainly in her right eye. She has tried eye lubricants and steroidal eye drops to no avail. Pt has had these symptoms evaluated by ENT & Optometrist, who found no cause. These symptoms have been intermittent for the last month. Pt also reports recent increase in urinary urgency, SOB, and new heart palpitations. She has had difficulty swallowing since 2018, which has not improved since dilation was performed during the latest Endoscopy. Pt was recently on Reclast but has been off for a few months. Pt  has been using OTC Ferrous Sulfate and has experienced constipation.    Most recent lab results (02/16/2020) of CBC is as follows: all values are WNL except for Hgb at 10.7, HCT at 33.5, MCV at 75.6, RDW at 15.7. 02/16/2020 IBC + Ferritin is as follows: Iron at 58, TIBC at 257.0, Sat Ratios at 16.1, Ferritin at 53.5.  On review of systems, pt reports tingling/numbness in hands/feet, discomfort in mastoid area, jaw pain, dry mouth, dry eyes, ankle pain, urinary urgency, SOB, palpitations, constipation and denies joint swelling, rash, mouth sores, abdominal pain, abnormal/excessive bleeding and any other symptoms.   On PMHx the pt reports Endometriosis, Vitamin D deficiency, Osteoporosis, Inappropriate sinus tachycardia, Back pain, Anemia, Partial hysterectomy.  Interval History .Marland KitchenI connected with [name] on 02/04/2022 at  3:30 PM EDT by telephone visit and verified that I am speaking with the correct person using two identifiers.   I discussed the limitations, risks, security and privacy concerns of performing an evaluation and management service by telemedicine and the availability of in-person appointments. I also discussed with the patient that there may be a patient responsible charge related to this service. The patient expressed understanding and agreed to proceed.   Other persons participating in the visit and their role in the encounter: None   Patient's location: home Provider's location: [facility]   Chief Complaint: Follow-up for iron deficiency anemia time to review labs  Patient is here for follow-up of her iron deficiency anemia.  She tolerated her IV iron well with no notable toxicities.  She notes  improved energy levels after IV iron. She is in the process of scheduling gastric bypass surgery and is happy that her anemia is better. Labs done today were discussed in detail with her.  MEDICAL HISTORY: Past Medical History:  Diagnosis Date   AKI (acute kidney injury) (H[facility])  10/06/2020   Allergy    Anemia    Angio-edema    Anxiety    Apnea 12/08/2019   Arthritis    Asthma    Back pain    Constipation    Exertional dyspnea 03/14/2020   Fatty liver    GERD (gastroesophageal reflux disease)    H/O: hysterectomy    Hx of endometriosis    Inappropriate sinus tachycardia 12/08/2019   Insomnia    Joint pain    Lactose intolerance    Macromastia 07/2011   Migraine    Morbid obesity (HCC) 12/08/2019   Osteoporosis    Urticaria    Vitamin D deficiency     SURGICAL HISTORY: Past Surgical History:  Procedure Laterality Date   63 HOUR PH STUDY N/A 01/12/2018   Procedure: 24 HOUR PH STUDY;  Surgeon: Napoleon Form, MD;  Location: [facility];  Service: Endoscopy;  Laterality: N/A;   ABDOMINAL HYSTERECTOMY  12/12/2003   ADENOIDECTOMY  05/2011   ANKLE ARTHROSCOPY  12/29/2007   right; with extensive debridement   BLADDER NECK RECONSTRUCTION  01/14/2011   Procedure: BLADDER NECK REPAIR;  Surgeon: Reva Bores, MD;  Location: [facility];  Service: Gynecology;  Laterality: N/A;  Laparoscopic Repair of Incidental Cystotomy   BREAST REDUCTION SURGERY  08/05/2011   Procedure: MAMMARY REDUCTION  (BREAST);  Surgeon: Louisa Second, MD;  Location: [facility];  Service: Plastics;  Laterality: Bilateral;  bilateral   BREAST SURGERY  2012   breast reduction   CESAREAN SECTION  12/29/2000; 1994   DILATION AND CURETTAGE OF UTERUS  07/08/2003   open laparoscopy   ESOPHAGEAL MANOMETRY N/A 01/12/2018   Procedure: ESOPHAGEAL MANOMETRY (EM);  Surgeon: Napoleon Form, MD;  [facility]: [facility];  Service: Endoscopy;  Laterality: N/A;   GIVENS CAPSULE STUDY N/A 06/23/2017   Procedure: GIVENS CAPSULE STUDY;  Surgeon: Benancio Deeds, MD;  [facility];  Service: Gastroenterology;  Laterality: N/A;   LAPAROSCOPIC SALPINGOOPHERECTOMY  01/14/2011   left   PH IMPEDANCE STUDY N/A 01/12/2018   Procedure: PH IMPEDANCE STUDY;  Surgeon: Napoleon Form, MD;   Location: [facility];  Service: Endoscopy;  Laterality: N/A;   REPAIR PERONEAL TENDONS ANKLE  01/03/2010   repair right subluxing peroneal tendons   RESECTION DISTAL CLAVICAL Right 09/25/2017   Procedure: RESECTION DISTAL CLAVICAL;  Surgeon: Bjorn Pippin, MD;  Location: [facility];  Service: Orthopedics;  Laterality: Right;   RIGHT OOPHORECTOMY     with lysis of adhesions   SHOULDER ACROMIOPLASTY Right 09/25/2017   Procedure: SHOULDER ACROMIOPLASTY;  Surgeon: Bjorn Pippin, MD;  Location: [facility];  Service: Orthopedics;  Laterality: Right;   SHOULDER ARTHROSCOPY WITH DEBRIDEMENT AND BICEP TENDON REPAIR Right 09/25/2017   Procedure: SHOULDER ARTHROSCOPY WITH DEBRIDEMENT AND BICEP TENDON REPAIR;  Surgeon: Bjorn Pippin, MD;  Location: [facility];  Service: Orthopedics;  Laterality: Right;   SHOULDER ARTHROSCOPY WITH ROTATOR CUFF REPAIR Right 09/25/2017   Procedure: SHOULDER ARTHROSCOPY WITH ROTATOR CUFF REPAIR;  Surgeon: Bjorn Pippin, MD;  Location:  SURGERY CENTER;  Service: Orthopedics;  Laterality: Right;   TONSILLECTOMY  as a child   TUBAL LIGATION  12/29/2000   UPPER GASTROINTESTINAL ENDOSCOPY      SOCIAL HISTORY: Social History   Socioeconomic History   Marital status: Divorced    Spouse name: Not on file   Number of children: 2   Years of education: 12th   Highest education level: Not on file  Occupational History   Occupation: International aid/development worker: [employer]  Tobacco Use   Smoking status: Never   Smokeless tobacco: Never  Vaping Use   Vaping Use: Never used  Substance and Sexual Activity   Alcohol use: Yes    Comment: occasional    Drug use: No   Sexual activity: Not on file  Other Topics Concern   Not on file  Social History Narrative   Pt lives in 2 story town home with her son   Has 2 children   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 [employer]   Right handed   Social  Determinants of Health   Financial Resource Strain: Not on file  Food Insecurity: No Food Insecurity (12/27/2020)   Hunger Vital Sign    Worried About Running Out of Food in the Last Year: Never true    [place] in the Last Year: Never true  Transportation Needs: No Transportation Needs (12/27/2020)   PRAPARE - Hydrologist (Medical): No    Lack of Transportation (Non-Medical): No  Physical Activity: Not on file  Stress: Not on file  Social Connections: Not on file  Intimate Partner Violence: Not on file    FAMILY HISTORY: Family History  Problem Relation Age of Onset   Diabetes Mother    Hypertension Mother    Hyperlipidemia Mother    Sleep apnea Mother    Thyroid disease Sister    Dementia Father    Heart attack Maternal Grandmother    Stroke Maternal Grandfather    Asthma Son    Healthy Son    Asthma Daughter    Healthy Daughter    Colon cancer Neg Hx    Colon polyps Neg Hx    Esophageal cancer Neg Hx    Rectal cancer Neg Hx    Stomach cancer Neg Hx     ALLERGIES:  is allergic to aspirin, ibuprofen, salmon [fish allergy], and zoledronic acid.  MEDICATIONS:  Current Outpatient Medications  Medication Sig Dispense Refill   acetaminophen (TYLENOL) 500 MG tablet Take 1,000 mg by mouth every 6 (six) hours as needed for moderate pain.     albuterol (VENTOLIN HFA) 108 (90 Base) MCG/ACT inhaler TAKE 2 PUFFS BY MOUTH EVERY 6 HOURS AS NEEDED FOR WHEEZE OR SHORTNESS OF BREATH (Patient taking differently: Inhale 2 puffs into the lungs every 6 (six) hours as needed for wheezing or shortness of breath.) 6.7 each 11   calcium carbonate (OS-CAL - DOSED IN MG OF ELEMENTAL CALCIUM) 1250 (500 Ca) MG tablet Take 1 tablet by mouth.     cetirizine (ZYRTEC) 10 MG tablet TAKE 1 TABLET (10 MG TOTAL) BY MOUTH DAILY AS NEEDED FOR RHINITIS. 90 tablet 1   clindamycin (CLEOCIN T) 1 % lotion Apply 1 application topically daily.     EPINEPHrine (EPIPEN 2-PAK) 0.3  mg/0.3 mL IJ SOAJ injection Inject 0.3 mg into the muscle as needed for anaphylaxis. 2 each 2   esomeprazole (NEXIUM) 40 MG capsule Take 1 capsule (40 mg total) by mouth 2 (two) times daily before a meal. Please schedule a follow  up appointment for further refills. 60 capsule 1   famotidine (PEPCID) 20 MG tablet Take 20 mg by mouth daily.     fluticasone furoate-vilanterol (BREO ELLIPTA) 100-25 MCG/INH AEPB Inhale 1 puff into the lungs daily as needed. (Patient taking differently: Inhale 1 puff into the lungs daily as needed (shortness of breath).) 60 each 11   Multiple Vitamin (MULTIVITAMIN WITH MINERALS) TABS tablet Take 1 tablet by mouth daily.     ondansetron (ZOFRAN) 4 MG tablet Take 1 tablet (4 mg total) by mouth daily as needed for nausea or vomiting. 30 tablet 0   RESTASIS 0.05 % ophthalmic emulsion Place 1 drop into both eyes 2 (two) times daily as needed (dryness).     tiZANidine (ZANAFLEX) 4 MG tablet Take 4 mg by mouth every 6 (six) hours as needed for muscle spasms.     tretinoin (RETIN-A) 0.025 % cream Apply 1 application  topically at bedtime.     Vitamin D, Ergocalciferol, (DRISDOL) 1.25 MG (50000 UNIT) CAPS capsule TAKE 1 CAPSULE (50,000 UNITS TOTAL) BY MOUTH EVERY 7 (SEVEN) DAYS 13 capsule 4   No current facility-administered medications for this visit.    REVIEW OF SYSTEMS:   10 Point review of Systems was done is negative except as noted above.  PHYSICAL EXAMINATION: Telemedicine visit  LABORATORY DATA:  I have reviewed the data as listed  .    Latest Ref Rng & Units 01/28/2022    2:14 PM 12/08/2021   10:04 AM 12/07/2021   12:24 PM  CBC  WBC 4.0 - 10.5 K/uL 4.7  5.4  4.3   Hemoglobin 12.0 - 15.0 g/dL 16.111.0  9.8  09.610.2   Hematocrit 36.0 - 46.0 % 34.9  31.5  31.9   Platelets 150 - 400 K/uL 254  259  261    . CBC    Component Value Date/Time   WBC 4.7 01/28/2022 1414   WBC 5.4 12/08/2021 1004   RBC 4.46 01/28/2022 1414   HGB 11.0 (L) 01/28/2022 1414   HGB 11.2  04/23/2021 1549   HCT 34.9 (L) 01/28/2022 1414   HCT 35.3 04/23/2021 1549   PLT 254 01/28/2022 1414   PLT 307 04/23/2021 1549   MCV 78.3 (L) 01/28/2022 1414   MCV 77 (L) 04/23/2021 1549   MCH 24.7 (L) 01/28/2022 1414   MCHC 31.5 01/28/2022 1414   RDW 13.9 01/28/2022 1414   RDW 12.0 04/23/2021 1549   LYMPHSABS 2.1 01/28/2022 1414   LYMPHSABS 1.9 01/04/2020 1046   MONOABS 0.4 01/28/2022 1414   EOSABS 0.1 01/28/2022 1414   EOSABS 0.1 01/04/2020 1046   BASOSABS 0.0 01/28/2022 1414   BASOSABS 0.0 01/04/2020 1046    .    Latest Ref Rng & Units 01/28/2022    2:14 PM 12/08/2021   10:04 AM 12/07/2021   12:24 PM  CMP  Glucose 70 - 99 mg/dL 98  93  94   BUN 6 - 20 mg/dL 10  7  9    Creatinine 0.44 - 1.00 mg/dL 0.450.68  4.090.70  8.110.73   Sodium 135 - 145 mmol/L 137  138  139   Potassium 3.5 - 5.1 mmol/L 3.9  3.6  3.7   Chloride 98 - 111 mmol/L 105  105  104   CO2 22 - 32 mmol/L 28  27  29    Calcium 8.9 - 10.3 mg/dL 8.9  9.1  8.8   Total Protein 6.5 - 8.1 g/dL 7.3   7.5   Total Bilirubin  0.3 - 1.2 mg/dL 0.3   0.4   Alkaline Phos 38 - 126 U/L 61   52   AST 15 - 41 U/L 23   22   ALT 0 - 44 U/L 22   17    . Lab Results  Component Value Date   IRON 80 01/28/2022   TIBC 298 01/28/2022   IRONPCTSAT 27 01/28/2022   (Iron and TIBC)  Lab Results  Component Value Date   FERRITIN 136 01/28/2022   Alpha-Thalassemia GenotypR Order: [id number] Status: Final result   Visible to patient: Yes (seen)   Next appt: 06/19/2020 at 10:40 AM in Family Medicine Gavin Pound Opalski, DO)   Dx: Microcytic anemia   0 Result Notes  Component 3 mo ago  Alpha-Thalassemia Comment:   Comment: (NOTE)  Test: Alpha-Thalassemia, DNA Analysis  Result:      Alpha-+-thalassemia trait, alpha alpha/alpha-  Mutation(s) identified: alpha3.7  Interpretation:  This result is most consistent with this individual being an  unaffected carrier of alpha-thalassemia with a single gene  deletion, also called alpha-+-thalassemia  trait.       RADIOGRAPHIC STUDIES: I have personally reviewed the radiological images as listed and agreed with the findings in the report. DG Knee 1-2 Views Right  Result Date: 01/18/2022 CLINICAL DATA:  Osteoarthritis of right knee. Increasing right anterolateral knee pain for 2 years. EXAM: RIGHT KNEE - 1-2 VIEW COMPARISON:  None Available. FINDINGS: Normal bone mineralization. Mild patellofemoral joint space narrowing and mild superior and inferior patellar degenerative osteophytosis. Mild chronic enthesopathic change at the quadriceps insertion on the patella. Minimal proximal patellar enthesopathic change at the inferior patella. No joint effusion. No acute fracture or dislocation. IMPRESSION: Very mild patellofemoral osteoarthritis. Electronically Signed   By: Neita Garnet M.D.   On: 01/18/2022 10:00    ASSESSMENT & PLAN:   46 yo female with   1) Microcytic Anemia due to iron deficiency 2) Alpha thalassemia trait  PLAN: Labs done today were discussed in detail with the patient. CBC shows improvement in hemoglobin up to 11 Ferritin is more than 100 and iron saturation is more than 20%. No indication for additional IV iron at this time B12 at 614 Patient's hemoglobin is adequate for her to proceed with her planned gastric bypass surgery. Patient is aware that after gastric bypass surgery she might have increased issues with iron deficiency with absorption issues related to surgery.   FOLLOW UP: Plz cancel labs and clinic visit with Dr Candise Che in dec 2023 RTC with Dr Candise Che with labs in 6 months  The total time spent in the appointment was 20 minutes*.  All of the patient's questions were answered with apparent satisfaction. The patient knows to call the clinic with any problems, questions or concerns.   Wyvonnia Lora MD MS AAHIVMS [facility] Hematology/Oncology Physician [facility]  .*Total Encounter Time as defined by the Centers for Medicare and Medicaid Services  includes, in addition to the face-to-face time of a patient visit (documented in the note above) non-face-to-face time: obtaining and reviewing outside history, ordering and reviewing medications, tests or procedures, care coordination (communications with other health care professionals or caregivers) and documentation in the medical record.

## 2022-02-14 NOTE — Progress Notes (Signed)
[facility] PREP Evaluation  Patient Details  Name: SARIN COMUNALE MRN: [id number] Date of Birth: 16-Jun-1975 Age: 46 y.o. PCP: [name], FNP  Vitals:   02/11/22 1530  BP: 118/60  Pulse: 69  SpO2: 96%  Weight: 247 lb (112 kg)     [facility] Eval - 02/14/22 1300       [facility] "PREP" Location   [facility] "PREP" Location [facility]      Referral    Referring Provider [employer] Start Date --   final class date 02/11/22     Measurement   Waist Circumference 49.5 inches    Hip Circumference 53.5 inches    Body fat 47.2 percent      Information for Trainer   Goals Encouraged to keep exercising      Mobility and Daily Activities   I find it easy to walk up or down two or more flights of stairs. 3    I have no trouble taking out the trash. 4    I do housework such as vacuuming and dusting on my own without difficulty. 3    I can easily lift a gallon of milk (8lbs). 4    I can easily walk a mile. 4    I have no trouble reaching into high cupboards or reaching down to pick up something from the floor. 3    I do not have trouble doing out-door work such as Armed forces logistics/support/administrative officer, raking leaves, or gardening. 3      Mobility and Daily Activities   I feel younger than my age. 3    I feel independent. 4    I feel energetic. 2    I live an active life.  3    I feel strong. 3    I feel healthy. 3    I feel active as other people my age. 4      How fit and strong are you.   Fit and Strong Total Score 46            Past Medical History:  Diagnosis Date   AKI (acute kidney injury) ([place]) 10/06/2020   Allergy    Anemia    Angio-edema    Anxiety    Apnea 12/08/2019   Arthritis    Asthma    Back pain    Constipation    Exertional dyspnea 03/14/2020   Fatty liver    GERD (gastroesophageal reflux disease)    H/O: hysterectomy    Hx of endometriosis    Inappropriate sinus tachycardia 12/08/2019   Insomnia    Joint pain    Lactose intolerance    Macromastia 07/2011   Migraine     Morbid obesity ([place]) 12/08/2019   Osteoporosis    Urticaria    Vitamin D deficiency    Past Surgical History:  Procedure Laterality Date   75 HOUR [place] STUDY N/A 01/12/2018   Procedure: 24 HOUR [place];  Surgeon: Mauri Pole, MD;  [facility];  Service: Endoscopy;  Laterality: N/A;   ABDOMINAL HYSTERECTOMY  12/12/2003   ADENOIDECTOMY  05/2011   ANKLE ARTHROSCOPY  12/29/2007   right; with extensive debridement   BLADDER NECK RECONSTRUCTION  01/14/2011   Procedure: BLADDER NECK REPAIR;  Surgeon: Donnamae Jude, MD;  Location: [facility];  Service: Gynecology;  Laterality: N/A;  Laparoscopic Repair of Incidental Cystotomy   BREAST REDUCTION SURGERY  08/05/2011   Procedure: MAMMARY REDUCTION  (BREAST);  Surgeon: Cristine Polio,  MD;  Location: [facility];  Service: Plastics;  Laterality: Bilateral;  bilateral   BREAST SURGERY  2012   breast reduction   CESAREAN SECTION  12/29/2000; 1994   DILATION AND CURETTAGE OF UTERUS  07/08/2003   open laparoscopy   ESOPHAGEAL MANOMETRY N/A 01/12/2018   Procedure: ESOPHAGEAL MANOMETRY (EM);  Surgeon: Napoleon Form, MD;  [facility];  Service: Endoscopy;  Laterality: N/A;   GIVENS CAPSULE STUDY N/A 06/23/2017   Procedure: GIVENS CAPSULE STUDY;  Surgeon: Benancio Deeds, MD;  Location: [facility];  Service: Gastroenterology;  Laterality: N/A;   LAPAROSCOPIC SALPINGOOPHERECTOMY  01/14/2011   left   PH IMPEDANCE STUDY N/A 01/12/2018   Procedure: PH IMPEDANCE STUDY;  Surgeon: Napoleon Form, MD;  [facility];  Service: Endoscopy;  Laterality: N/A;   REPAIR PERONEAL TENDONS ANKLE  01/03/2010   repair right subluxing peroneal tendons   RESECTION DISTAL CLAVICAL Right 09/25/2017   Procedure: RESECTION DISTAL CLAVICAL;  Surgeon: Bjorn Pippin, MD;  Location: [facility];  Service: Orthopedics;  Laterality: Right;   RIGHT OOPHORECTOMY     with lysis of adhesions   SHOULDER ACROMIOPLASTY Right  09/25/2017   Procedure: SHOULDER ACROMIOPLASTY;  Surgeon: Bjorn Pippin, MD;  Location: [facility];  Service: Orthopedics;  Laterality: Right;   SHOULDER ARTHROSCOPY WITH DEBRIDEMENT AND BICEP TENDON REPAIR Right 09/25/2017   Procedure: SHOULDER ARTHROSCOPY WITH DEBRIDEMENT AND BICEP TENDON REPAIR;  Surgeon: Bjorn Pippin, MD;  Location:  SURGERY CENTER;  Service: Orthopedics;  Laterality: Right;   SHOULDER ARTHROSCOPY WITH ROTATOR CUFF REPAIR Right 09/25/2017   Procedure: SHOULDER ARTHROSCOPY WITH ROTATOR CUFF REPAIR;  Surgeon: Bjorn Pippin, MD;  Location:  SURGERY CENTER;  Service: Orthopedics;  Laterality: Right;   TONSILLECTOMY  as a child   TUBAL LIGATION  12/29/2000   UPPER GASTROINTESTINAL ENDOSCOPY     Social History   Tobacco Use  Smoking Status Never  Smokeless Tobacco Never   Attendance: >19 workouts, 8 of 12 educational sessions Cardio march: 220 to 260 Sit to stand: 11 to 15 Bicep curl: 17 to 27  Balance remains good Thinks weight gain is related to HRT she started.  Bonnye Fava 02/14/2022, 1:52 PM

## 2022-02-18 ENCOUNTER — Encounter: Payer: Commercial Managed Care - HMO | Attending: Nurse Practitioner | Admitting: Dietician

## 2022-02-18 ENCOUNTER — Encounter: Payer: Self-pay | Admitting: Dietician

## 2022-02-18 DIAGNOSIS — E669 Obesity, unspecified: Secondary | ICD-10-CM

## 2022-02-18 DIAGNOSIS — Z6841 Body Mass Index (BMI) 40.0 and over, adult: Secondary | ICD-10-CM | POA: Diagnosis not present

## 2022-02-18 NOTE — Progress Notes (Signed)
Pre-Operative Nutrition Class:    Patient was seen on 02/18/2022 for Pre-Operative Bariatric Surgery Education at the [facility].    Surgery date:  Surgery type: RYGB  Anthropometrics  Start weight at [facility]: 241.4 lbs (date: 11/22/2021)  Height: 64 in Weight today: 250.9 BMI: 43.07 kg/m2     Clinical  Medical hx: Allergies, Anemia, Anxiety, arthritis, asthma, GERD, kidney disease, osteoporosis Medications: Esomeprazole, one a day vitamin, vit D, Calcium  Labs: Hemoglobin 10.4; Hematocrit 33.4;  Notable signs/symptoms: nothing noted Any previous deficiencies? No  Samples given per MNT protocol. Patient educated on appropriate usage: Bariatric Advantage Multivitamin Lot # [id number] Exp: 08/24  Bariatric Advantage Calcium  Lot # [id number] Exp: 03/26/2022  Protein Shake Ensure Max Choc Lot # [id number] Exp: 06 May 2022  Protein Shake Ensure Max Lucianne Lei Lot # [id number] Exp: 05 Apr 2022  The following the learning objectives were met by the patient during this course: Identify Pre-Op Dietary Goals and will begin 2 weeks pre-operatively Identify appropriate sources of fluids and proteins  State protein recommendations and appropriate sources pre and post-operatively Identify Post-Operative Dietary Goals and will follow for 2 weeks post-operatively Identify appropriate multivitamin and calcium sources Describe the need for physical activity post-operatively and will follow MD recommendations State when to call healthcare provider regarding medication questions or post-operative complications When having a diagnosis of diabetes understanding hypoglycemia symptoms and the inclusion of 1 complex carbohydrate per meal  Handouts given during class include: Pre-Op Bariatric Surgery Diet Handout Protein Shake Handout Post-Op Bariatric Surgery Nutrition Handout BELT Program Information Flyer Support Group Information Flyer [facility] Bariatric  Supplements Price List  Follow-Up Plan: Patient will follow-up at [facility] 2 weeks post operatively for diet advancement per MD.

## 2022-03-01 NOTE — Progress Notes (Signed)
Sent message, via epic in basket, requesting order in epic from surgeon     03/01/22 1415  Preop Orders  Has preop orders? No  Name of staff/physician contacted for orders(Indicate phone or IB message) wilson, E. MD.

## 2022-03-07 ENCOUNTER — Ambulatory Visit: Payer: Self-pay | Admitting: General Surgery

## 2022-03-07 DIAGNOSIS — D500 Iron deficiency anemia secondary to blood loss (chronic): Secondary | ICD-10-CM

## 2022-03-07 NOTE — Patient Instructions (Signed)
DUE TO COVID-19 ONLY TWO VISITORS  (aged 46 and older)  ARE ALLOWED TO COME WITH YOU AND STAY IN THE WAITING ROOM ONLY DURING PRE OP AND PROCEDURE.   **NO VISITORS ARE ALLOWED IN THE SHORT STAY AREA OR RECOVERY ROOM!!**  IF YOU WILL BE ADMITTED INTO THE HOSPITAL YOU ARE ALLOWED ONLY FOUR SUPPORT PEOPLE DURING VISITATION HOURS ONLY (7 AM -8PM)   The support person(s) must pass our screening, gel in and out, and wear a mask at all times, including in the patient's room. Patients must also wear a mask when staff or their support person are in the room. Visitors GUEST BADGE MUST BE WORN VISIBLY  One adult visitor may remain with you overnight and MUST be in the room by 8 P.M.     Your procedure is scheduled on: 03/19/22   Report to [facility] Main Entrance    Report to admitting at 8:00 AM   Call this number if you have problems the morning of surgery [phone]    NO SOLID FOOD AFTER 6:00 PM THE NIGHT BEFORE YOUR SURGERY.   YOU MAY DRINK CLEAR FLUIDS THE MORNING OF SURGERY UNTIL:7:15 AM see below   THE G2 DRINK YOU DRINK BEFORE YOU LEAVE HOME WILL BE THE LAST FLUIDS YOU DRINK BEFORE SURGERY THEN NOTHING MORE BY MOUTH.   PAIN IS EXPECTED AFTER SURGERY AND WILL NOT BE COMPLETELY ELIMINATED.   AMBULATION AND TYLENOL WILL HELP REDUCE INCISIONAL AND GAS PAIN. MOVEMENT IS KEY!  YOU ARE EXPECTED TO BE OUT OF BED WITHIN 4 HOURS OF ADMISSION TO YOUR PATIENT ROOM.  SITTING IN THE RECLINER THROUGHOUT THE DAY IS IMPORTANT FOR DRINKING FLUIDS AND MOVING GAS THROUGHOUT THE GI TRACT.  COMPRESSION STOCKINGS SHOULD BE WORN [place] UNLESS YOU ARE WALKING.   INCENTIVE SPIROMETER SHOULD BE USED EVERY HOUR WHILE AWAKE TO DECREASE POST-OPERATIVE COMPLICATIONS SUCH AS PNEUMONIA.  WHEN DISCHARGED HOME, IT IS IMPORTANT TO CONTINUE TO WALK EVERY HOUR AND USE THE INCENTIVE SPIROMETER EVERY HOUR.    Clear liquid diet:  Water Black Coffee (sugar ok, NO MILK/CREAM OR  CREAMERS)  Tea (sugar ok, NO MILK/CREAM OR CREAMERS) regular and decaf                             Plain Jell-O (NO RED)                                           Fruit ices (not with fruit pulp, NO RED)                                     Popsicles (NO RED)                                                                  Juice: apple, WHITE grape, WHITE cranberry Sports drinks like Gatorade (NO RED)                  The day of surgery:  Drink ONE  G2 at  7:00  AM the morning of surgery. Drink in one sitting. Do not sip.  This drink was given to you during your hospital  pre-op appointment visit. Nothing else to drink after completing the   G2.at 7:15 AM          If you have questions, please contact your surgeon's office.       Oral Hygiene is also important to reduce your risk of infection.                                    Remember - BRUSH YOUR TEETH THE MORNING OF SURGERY WITH YOUR REGULAR TOOTHPASTE   Do NOT smoke after Midnight   Take these medicines the morning of surgery with A SIP OF WATER: Nexium                 Use your inhalers and bring with you   Bring CPAP mask and tubing day of surgery.                              You may not have any metal on your body including hair pins, jewelry, and body piercing             Do not wear make-up, lotions, powders, perfumes/cologne, or deodorant  Do not wear nail polish including gel and S&S, artificial/acrylic nails, or any other type of covering on natural nails including finger and toenails. If you have artificial nails, gel coating, etc. that needs to be removed by a nail salon please have this removed prior to surgery or surgery may need to be canceled/ delayed if the surgeon/ anesthesia feels like they are unable to be safely monitored.   Do not shave  48 hours prior to surgery.     Do not bring valuables to the hospital. [facility] IS NOT             RESPONSIBLE   FOR VALUABLES.   Contacts, dentures or  bridgework may not be worn into surgery.   Bring small overnight bag day of surgery.   DO NOT BRING YOUR HOME MEDICATIONS TO THE HOSPITAL. PHARMACY WILL DISPENSE MEDICATIONS LISTED ON YOUR MEDICATION LIST TO YOU DURING YOUR ADMISSION IN THE HOSPITAL!       Special Instructions: Bring a copy of your healthcare power of attorney and living will documents  the day of surgery if you haven't scanned them before.              Please read over the following fact sheets you were given: IF YOU HAVE QUESTIONS ABOUT YOUR PRE-OP INSTRUCTIONS PLEASE CALL [phone]     [facility] - Preparing for Surgery Before surgery, you can play an important role.  Because skin is not sterile, your skin needs to be as free of germs as possible.  You can reduce the number of germs on your skin by washing with CHG (chlorahexidine gluconate) soap before surgery.  CHG is an antiseptic cleaner which kills germs and bonds with the skin to continue killing germs even after washing. Please DO NOT use if you have an allergy to CHG or antibacterial soaps.  If your skin becomes reddened/irritated stop using the CHG and inform your nurse when you arrive at Short Stay. Do not shave (including legs and underarms) for at least 48 hours prior to the first CHG  shower.   Please follow these instructions carefully:  1.  Shower with CHG Soap the night before surgery and the  morning of Surgery.  2.  If you choose to wash your hair, wash your hair first as usual with your  normal  shampoo.  3.  After you shampoo, rinse your hair and body thoroughly to remove the  shampoo.                            4.  Use CHG as you would any other liquid soap.  You can apply chg directly  to the skin and wash                       Gently with a scrungie or clean washcloth.  5.  Apply the CHG Soap to your body ONLY FROM THE NECK DOWN.   Do not use on face/ open                           Wound or open sores. Avoid contact with eyes, ears mouth and  genitals (private parts).                       Wash face,  Genitals (private parts) with your normal soap.             6.  Wash thoroughly, paying special attention to the area where your surgery  will be performed.  7.  Thoroughly rinse your body with warm water from the neck down.  8.  DO NOT shower/wash with your normal soap after using and rinsing off  the CHG Soap.             9.  Pat yourself dry with a clean towel.            10.  Wear clean pajamas.            11.  Place clean sheets on your bed the night of your first shower and do not  sleep with pets. Day of Surgery : Do not apply any lotions/deodorants the morning of surgery.  Please wear clean clothes to the hospital/surgery center.  FAILURE TO FOLLOW THESE INSTRUCTIONS MAY RESULT IN THE CANCELLATION OF YOUR SURGERY     ________________________________________________________________________  Incentive Spirometer  An incentive spirometer is a tool that can help keep your lungs clear and active. This tool measures how well you are filling your lungs with each breath. Taking long deep breaths may help reverse or decrease the chance of developing breathing (pulmonary) problems (especially infection) following: A long period of time when you are unable to move or be active. BEFORE THE PROCEDURE  If the spirometer includes an indicator to show your best effort, your nurse or respiratory therapist will set it to a desired goal. If possible, sit up straight or lean slightly forward. Try not to slouch. Hold the incentive spirometer in an upright position. INSTRUCTIONS FOR USE  Sit on the edge of your bed if possible, or sit up as far as you can in bed or on a chair. Hold the incentive spirometer in an upright position. Breathe out normally. Place the mouthpiece in your mouth and seal your lips tightly around it. Breathe in slowly and as deeply as possible, raising the piston or the ball toward the top of the column. Hold your  breath for  3-5 seconds or for as long as possible. Allow the piston or ball to fall to the bottom of the column. Remove the mouthpiece from your mouth and breathe out normally. Rest for a few seconds and repeat Steps 1 through 7 at least 10 times every 1-2 hours when you are awake. Take your time and take a few normal breaths between deep breaths. The spirometer may include an indicator to show your best effort. Use the indicator as a goal to work toward during each repetition. After each set of 10 deep breaths, practice coughing to be sure your lungs are clear. If you have an incision (the cut made at the time of surgery), support your incision when coughing by placing a pillow or rolled up towels firmly against it. Once you are able to get out of bed, walk around indoors and cough well. You may stop using the incentive spirometer when instructed by your caregiver.  RISKS AND COMPLICATIONS Take your time so you do not get dizzy or light-headed. If you are in pain, you may need to take or ask for pain medication before doing incentive spirometry. It is harder to take a deep breath if you are having pain. AFTER USE Rest and breathe slowly and easily. It can be helpful to keep track of a log of your progress. Your caregiver can provide you with a simple table to help with this. If you are using the spirometer at home, follow these instructions: SEEK MEDICAL CARE IF:  You are having difficultly using the spirometer. You have trouble using the spirometer as often as instructed. Your pain medication is not giving enough relief while using the spirometer. You develop fever of 100.5 F (38.1 C) or higher. SEEK IMMEDIATE MEDICAL CARE IF:  You cough up bloody sputum that had not been present before. You develop fever of 102 F (38.9 C) or greater. You develop worsening pain at or near the incision site. MAKE SURE YOU:  Understand these instructions. Will watch your condition. Will get help right  away if you are not doing well or get worse. Document Released: 09/02/2006 Document Revised: 07/15/2011 Document Reviewed: 11/03/2006 [facility] Patient Information 2014 [place], [place].   ________________________________________________________________________

## 2022-03-08 ENCOUNTER — Encounter (HOSPITAL_COMMUNITY)
Admission: RE | Admit: 2022-03-08 | Discharge: 2022-03-08 | Disposition: A | Discharge status: Home / Self Care | Payer: Commercial Managed Care - HMO | Source: Ambulatory Visit | Attending: General Surgery | Admitting: General Surgery

## 2022-03-08 ENCOUNTER — Other Ambulatory Visit: Payer: Self-pay

## 2022-03-08 ENCOUNTER — Encounter (HOSPITAL_COMMUNITY): Payer: Self-pay

## 2022-03-08 DIAGNOSIS — D500 Iron deficiency anemia secondary to blood loss (chronic): Secondary | ICD-10-CM | POA: Insufficient documentation

## 2022-03-08 DIAGNOSIS — Z01818 Encounter for other preprocedural examination: Secondary | ICD-10-CM | POA: Insufficient documentation

## 2022-03-08 LAB — CBC WITH DIFFERENTIAL/PLATELET
Abs Immature Granulocytes: 0.01 10*3/uL (ref 0.00–0.07)
Basophils Absolute: 0 10*3/uL (ref 0.0–0.1)
Basophils Relative: 1 %
Eosinophils Absolute: 0 10*3/uL (ref 0.0–0.5)
Eosinophils Relative: 0 %
HCT: 37.6 % (ref 36.0–46.0)
Hemoglobin: 11.8 g/dL — ABNORMAL LOW (ref 12.0–15.0)
Immature Granulocytes: 0 %
Lymphocytes Relative: 34 %
Lymphs Abs: 1.8 10*3/uL (ref 0.7–4.0)
MCH: 25.1 pg — ABNORMAL LOW (ref 26.0–34.0)
MCHC: 31.4 g/dL (ref 30.0–36.0)
MCV: 79.8 fL — ABNORMAL LOW (ref 80.0–100.0)
Monocytes Absolute: 0.4 10*3/uL (ref 0.1–1.0)
Monocytes Relative: 8 %
Neutro Abs: 3 10*3/uL (ref 1.7–7.7)
Neutrophils Relative %: 57 %
Platelets: 279 10*3/uL (ref 150–400)
RBC: 4.71 MIL/uL (ref 3.87–5.11)
RDW: 13.6 % (ref 11.5–15.5)
WBC: 5.3 10*3/uL (ref 4.0–10.5)
nRBC: 0 % (ref 0.0–0.2)

## 2022-03-08 LAB — COMPREHENSIVE METABOLIC PANEL WITH GFR
ALT: 22 U/L (ref 0–44)
AST: 25 U/L (ref 15–41)
Albumin: 4.2 g/dL (ref 3.5–5.0)
Alkaline Phosphatase: 61 U/L (ref 38–126)
Anion gap: 8 (ref 5–15)
BUN: 23 mg/dL — ABNORMAL HIGH (ref 6–20)
CO2: 26 mmol/L (ref 22–32)
Calcium: 9.2 mg/dL (ref 8.9–10.3)
Chloride: 102 mmol/L (ref 98–111)
Creatinine, Ser: 0.7 mg/dL (ref 0.44–1.00)
GFR, Estimated: >60 mL/min
Glucose, Bld: 89 mg/dL (ref 70–99)
Potassium: 4.6 mmol/L (ref 3.5–5.1)
Sodium: 136 mmol/L (ref 135–145)
Total Bilirubin: 0.5 mg/dL (ref 0.3–1.2)
Total Protein: 7.9 g/dL (ref 6.5–8.1)

## 2022-03-08 LAB — TYPE AND SCREEN
ABO/RH(D): A NEG
Antibody Screen: NEGATIVE

## 2022-03-08 NOTE — Progress Notes (Signed)
Anesthesia note:  Bowel prep reminder:  Na  PCP - [name] FNP Cardiologist -Dr. Berneice Gandy Other-   Chest x-ray - 12/08/21-epic EKG - 12/10/21-epic Stress Test - 2018 ECHO - 2021 Cardiac Cath - no CABG-no Pacemaker/ICD device last checked:NA  Sleep Study - yes negative CPAP -no   Pt is pre diabetic-NA CBG at PAT visit- Fasting Blood Sugar at home- Checks Blood Sugar _____  Blood Thinner:NA Blood Thinner Instructions: Aspirin Instructions: Last Dose:  Anesthesia review: /No    Patient denies shortness of breath, fever, cough and chest pain at PAT appointment Pt has asthma but hasn't needed her inhaler in a while  Patient verbalized understanding of instructions that were given to them at the PAT appointment. Patient was also instructed that they will need to review over the PAT instructions again at home before surgery.yes

## 2022-03-11 ENCOUNTER — Telehealth: Payer: Self-pay | Admitting: Skilled Nursing Facility1

## 2022-03-11 NOTE — Telephone Encounter (Signed)
Called to check in on pts understanding of th pre-op.   Dietitian reviewed the pre-op diet with pt to answer her questions.   Pt feels better about things now.

## 2022-03-15 ENCOUNTER — Other Ambulatory Visit (HOSPITAL_COMMUNITY): Payer: Commercial Managed Care - HMO

## 2022-03-18 NOTE — Anesthesia Preprocedure Evaluation (Addendum)
Anesthesia Evaluation  Patient identified by MRN, date of birth, ID band Patient awake    Reviewed: Allergy & Precautions, NPO status , Patient's Chart, lab work & pertinent test results  History of Anesthesia Complications Negative for: history of anesthetic complications  Airway Mallampati: III  TM Distance: >3 FB Neck ROM: Full    Dental no notable dental hx.    Pulmonary neg shortness of breath, asthma (no recent inhaler use) , neg sleep apnea, neg COPD, neg recent URI   Pulmonary exam normal breath sounds clear to auscultation       Cardiovascular negative cardio ROS  Rhythm:Regular Rate:Normal     Neuro/Psych  Headaches, neg Seizures PSYCHIATRIC DISORDERS Anxiety        GI/Hepatic ,GERD  Medicated,,Fatty liver   Endo/Other    Morbid obesityprediabetes  Renal/GU negative Renal ROS     Musculoskeletal  (+) Arthritis ,    Abdominal  (+) + obese  Peds  Hematology  (+) Blood dyscrasia, anemia   Anesthesia Other Findings Osteoporosis, HLD  Reproductive/Obstetrics                             Anesthesia Physical Anesthesia Plan  ASA: 3  Anesthesia Plan: General   Post-op Pain Management: Tylenol PO (pre-op)*   Induction: Intravenous  PONV Risk Score and Plan: 3 and Ondansetron, Dexamethasone, Midazolam and Scopolamine patch - Pre-op  Airway Management Planned: Oral ETT  Additional Equipment:   Intra-op Plan:   Post-operative Plan: Extubation in OR  Informed Consent: I have reviewed the patients History and Physical, chart, labs and discussed the procedure including the risks, benefits and alternatives for the proposed anesthesia with the patient or authorized representative who has indicated his/her understanding and acceptance.     Dental advisory given  Plan Discussed with: CRNA and Anesthesiologist  Anesthesia Plan Comments: (Risks of general anesthesia discussed  including, but not limited to, sore throat, hoarse voice, chipped/damaged teeth, injury to vocal cords, nausea and vomiting, allergic reactions, lung infection, heart attack, stroke, and death. All questions answered. )       Anesthesia Quick Evaluation

## 2022-03-19 ENCOUNTER — Encounter (HOSPITAL_COMMUNITY): Payer: Self-pay | Admitting: General Surgery

## 2022-03-19 ENCOUNTER — Inpatient Hospital Stay (HOSPITAL_COMMUNITY): Payer: Commercial Managed Care - HMO | Admitting: Anesthesiology

## 2022-03-19 ENCOUNTER — Inpatient Hospital Stay (HOSPITAL_COMMUNITY)
Admission: RE | Admit: 2022-03-19 | Discharge: 2022-03-21 | DRG: 621 | Disposition: A | Discharge status: Home / Self Care | Payer: Commercial Managed Care - HMO | Source: Ambulatory Visit | Attending: General Surgery | Admitting: General Surgery

## 2022-03-19 ENCOUNTER — Other Ambulatory Visit: Payer: Self-pay

## 2022-03-19 ENCOUNTER — Encounter (HOSPITAL_COMMUNITY)
Admission: RE | Disposition: A | Discharge status: Home / Self Care | Payer: Self-pay | Source: Ambulatory Visit | Attending: General Surgery

## 2022-03-19 DIAGNOSIS — F419 Anxiety disorder, unspecified: Secondary | ICD-10-CM | POA: Diagnosis not present

## 2022-03-19 DIAGNOSIS — M810 Age-related osteoporosis without current pathological fracture: Secondary | ICD-10-CM | POA: Diagnosis present

## 2022-03-19 DIAGNOSIS — F32A Depression, unspecified: Secondary | ICD-10-CM | POA: Diagnosis present

## 2022-03-19 DIAGNOSIS — E7800 Pure hypercholesterolemia, unspecified: Secondary | ICD-10-CM

## 2022-03-19 DIAGNOSIS — K219 Gastro-esophageal reflux disease without esophagitis: Secondary | ICD-10-CM | POA: Diagnosis present

## 2022-03-19 DIAGNOSIS — K449 Diaphragmatic hernia without obstruction or gangrene: Secondary | ICD-10-CM | POA: Diagnosis present

## 2022-03-19 DIAGNOSIS — Z9884 Bariatric surgery status: Principal | ICD-10-CM

## 2022-03-19 DIAGNOSIS — J4540 Moderate persistent asthma, uncomplicated: Secondary | ICD-10-CM | POA: Diagnosis present

## 2022-03-19 DIAGNOSIS — R7303 Prediabetes: Secondary | ICD-10-CM | POA: Diagnosis present

## 2022-03-19 DIAGNOSIS — K760 Fatty (change of) liver, not elsewhere classified: Secondary | ICD-10-CM | POA: Diagnosis present

## 2022-03-19 DIAGNOSIS — Z79899 Other long term (current) drug therapy: Secondary | ICD-10-CM

## 2022-03-19 DIAGNOSIS — Z6841 Body Mass Index (BMI) 40.0 and over, adult: Secondary | ICD-10-CM | POA: Diagnosis not present

## 2022-03-19 HISTORY — PX: GASTRIC ROUX-EN-Y: SHX5262

## 2022-03-19 HISTORY — PX: HIATAL HERNIA REPAIR: SHX195

## 2022-03-19 LAB — CBC
HCT: 39 % (ref 36.0–46.0)
Hemoglobin: 12.2 g/dL (ref 12.0–15.0)
MCH: 24.9 pg — ABNORMAL LOW (ref 26.0–34.0)
MCHC: 31.3 g/dL (ref 30.0–36.0)
MCV: 79.8 fL — ABNORMAL LOW (ref 80.0–100.0)
Platelets: 328 10*3/uL (ref 150–400)
RBC: 4.89 MIL/uL (ref 3.87–5.11)
RDW: 13.5 % (ref 11.5–15.5)
WBC: 14 10*3/uL — ABNORMAL HIGH (ref 4.0–10.5)
nRBC: 0 % (ref 0.0–0.2)

## 2022-03-19 LAB — ABO/RH: ABO/RH(D): A NEG

## 2022-03-19 SURGERY — LAPAROSCOPIC ROUX-EN-Y GASTRIC BYPASS WITH UPPER ENDOSCOPY
Anesthesia: General

## 2022-03-19 MED ORDER — BUPIVACAINE LIPOSOME 1.3 % IJ SUSP
INTRAMUSCULAR | Status: DC | PRN
  Administered 2022-03-19: 20 mL

## 2022-03-19 MED ORDER — LACTATED RINGERS IR SOLN
Status: DC | PRN
  Administered 2022-03-19: 1

## 2022-03-19 MED ORDER — FIBRIN SEALANT 2 ML SINGLE DOSE KIT
2.0000 mL | PACK | Freq: Once | CUTANEOUS | Status: AC
  Administered 2022-03-19: 2 mL via TOPICAL
  Filled 2022-03-19: qty 2

## 2022-03-19 MED ORDER — ACETAMINOPHEN 500 MG PO TABS
1000.0000 mg | ORAL_TABLET | Freq: Three times a day (TID) | ORAL | Status: DC
  Administered 2022-03-19 – 2022-03-20 (×3): 1000 mg via ORAL
  Filled 2022-03-19 (×3): qty 2

## 2022-03-19 MED ORDER — ALBUTEROL SULFATE (2.5 MG/3ML) 0.083% IN NEBU
3.0000 mL | INHALATION_SOLUTION | Freq: Four times a day (QID) | RESPIRATORY_TRACT | Status: DC | PRN
  Administered 2022-03-19: 3 mL via RESPIRATORY_TRACT
  Filled 2022-03-19: qty 3

## 2022-03-19 MED ORDER — BUPIVACAINE LIPOSOME 1.3 % IJ SUSP: 20.0000 mL | Freq: Once | INTRAMUSCULAR | Status: DC

## 2022-03-19 MED ORDER — METHOCARBAMOL 1000 MG/10ML IJ SOLN
500.0000 mg | Freq: Three times a day (TID) | INTRAVENOUS | Status: DC | PRN
  Administered 2022-03-19 – 2022-03-21 (×4): 500 mg via INTRAVENOUS
  Filled 2022-03-19 (×4): qty 500

## 2022-03-19 MED ORDER — MIDAZOLAM HCL 2 MG/2ML IJ SOLN
INTRAMUSCULAR | Status: AC
  Filled 2022-03-19: qty 2

## 2022-03-19 MED ORDER — PROMETHAZINE HCL 25 MG/ML IJ SOLN: 6.2500 mg | INTRAMUSCULAR | Status: DC | PRN

## 2022-03-19 MED ORDER — BUPIVACAINE HCL 0.25 % IJ SOLN
INTRAMUSCULAR | Status: AC
  Filled 2022-03-19: qty 1

## 2022-03-19 MED ORDER — CHLORHEXIDINE GLUCONATE 0.12 % MT SOLN
15.0000 mL | Freq: Once | OROMUCOSAL | Status: AC
  Administered 2022-03-19: 15 mL via OROMUCOSAL

## 2022-03-19 MED ORDER — KETAMINE HCL 10 MG/ML IJ SOLN
INTRAMUSCULAR | Status: DC | PRN
  Administered 2022-03-19 (×3): 10 mg via INTRAVENOUS
  Administered 2022-03-19: 20 mg via INTRAVENOUS

## 2022-03-19 MED ORDER — KETAMINE HCL 50 MG/5ML IJ SOSY
PREFILLED_SYRINGE | INTRAMUSCULAR | Status: AC
  Filled 2022-03-19: qty 5

## 2022-03-19 MED ORDER — BUPIVACAINE HCL (PF) 0.25 % IJ SOLN
INTRAMUSCULAR | Status: DC | PRN
  Administered 2022-03-19: 30 mL

## 2022-03-19 MED ORDER — ROCURONIUM BROMIDE 10 MG/ML (PF) SYRINGE
PREFILLED_SYRINGE | INTRAVENOUS | Status: DC | PRN
  Administered 2022-03-19: 80 mg via INTRAVENOUS
  Administered 2022-03-19: 10 mg via INTRAVENOUS
  Administered 2022-03-19: 20 mg via INTRAVENOUS

## 2022-03-19 MED ORDER — KCL IN DEXTROSE-NACL 20-5-0.45 MEQ/L-%-% IV SOLN
INTRAVENOUS | Status: DC
  Filled 2022-03-19 (×6): qty 1000

## 2022-03-19 MED ORDER — HEPARIN SODIUM (PORCINE) 5000 UNIT/ML IJ SOLN
5000.0000 [IU] | Freq: Three times a day (TID) | INTRAMUSCULAR | Status: DC
  Administered 2022-03-19 – 2022-03-21 (×5): 5000 [IU] via SUBCUTANEOUS
  Filled 2022-03-19 (×5): qty 1

## 2022-03-19 MED ORDER — APREPITANT 40 MG PO CAPS
40.0000 mg | ORAL_CAPSULE | ORAL | Status: AC
  Administered 2022-03-19: 40 mg via ORAL
  Filled 2022-03-19: qty 1

## 2022-03-19 MED ORDER — PROPOFOL 10 MG/ML IV BOLUS
INTRAVENOUS | Status: DC | PRN
  Administered 2022-03-19: 200 mg via INTRAVENOUS

## 2022-03-19 MED ORDER — PROPOFOL 10 MG/ML IV BOLUS
INTRAVENOUS | Status: AC
  Filled 2022-03-19: qty 20

## 2022-03-19 MED ORDER — LIDOCAINE 2% (20 MG/ML) 5 ML SYRINGE
INTRAMUSCULAR | Status: DC | PRN
  Administered 2022-03-19: 80 mg via INTRAVENOUS

## 2022-03-19 MED ORDER — DEXAMETHASONE SODIUM PHOSPHATE 4 MG/ML IJ SOLN
4.0000 mg | INTRAMUSCULAR | Status: AC
  Administered 2022-03-19: 8 mg via INTRAVENOUS

## 2022-03-19 MED ORDER — FLUTICASONE FUROATE-VILANTEROL 100-25 MCG/ACT IN AEPB: 1.0000 | INHALATION_SPRAY | Freq: Every day | RESPIRATORY_TRACT | Status: DC | PRN

## 2022-03-19 MED ORDER — LIDOCAINE HCL 2 % IJ SOLN
INTRAMUSCULAR | Status: AC
  Filled 2022-03-19: qty 20

## 2022-03-19 MED ORDER — LACTATED RINGERS IV SOLN: INTRAVENOUS | Status: DC

## 2022-03-19 MED ORDER — STERILE WATER FOR IRRIGATION IR SOLN
Status: DC | PRN
  Administered 2022-03-19: 1000 mL

## 2022-03-19 MED ORDER — ONDANSETRON HCL 4 MG/2ML IJ SOLN
INTRAMUSCULAR | Status: AC
  Filled 2022-03-19: qty 2

## 2022-03-19 MED ORDER — FENTANYL CITRATE (PF) 100 MCG/2ML IJ SOLN
INTRAMUSCULAR | Status: DC | PRN
  Administered 2022-03-19 (×2): 50 ug via INTRAVENOUS
  Administered 2022-03-19: 100 ug via INTRAVENOUS

## 2022-03-19 MED ORDER — ACETAMINOPHEN 160 MG/5ML PO SOLN
1000.0000 mg | Freq: Three times a day (TID) | ORAL | Status: DC
  Administered 2022-03-20 – 2022-03-21 (×2): 1000 mg via ORAL
  Filled 2022-03-19 (×2): qty 40.6

## 2022-03-19 MED ORDER — ONDANSETRON HCL 4 MG/2ML IJ SOLN
4.0000 mg | Freq: Four times a day (QID) | INTRAMUSCULAR | Status: DC | PRN
  Filled 2022-03-19: qty 2

## 2022-03-19 MED ORDER — ENSURE MAX PROTEIN PO LIQD
2.0000 [oz_av] | ORAL | Status: DC
  Administered 2022-03-20 – 2022-03-21 (×9): 2 [oz_av] via ORAL

## 2022-03-19 MED ORDER — PANTOPRAZOLE SODIUM 40 MG IV SOLR
40.0000 mg | Freq: Every day | INTRAVENOUS | Status: DC
  Administered 2022-03-19 – 2022-03-20 (×2): 40 mg via INTRAVENOUS
  Filled 2022-03-19 (×2): qty 10

## 2022-03-19 MED ORDER — BUPIVACAINE LIPOSOME 1.3 % IJ SUSP
INTRAMUSCULAR | Status: AC
  Filled 2022-03-19: qty 20

## 2022-03-19 MED ORDER — SIMETHICONE 80 MG PO CHEW
80.0000 mg | CHEWABLE_TABLET | Freq: Four times a day (QID) | ORAL | Status: DC | PRN
  Administered 2022-03-19 – 2022-03-20 (×4): 80 mg via ORAL
  Filled 2022-03-19 (×4): qty 1

## 2022-03-19 MED ORDER — MIDAZOLAM HCL 5 MG/5ML IJ SOLN
INTRAMUSCULAR | Status: DC | PRN
  Administered 2022-03-19: 2 mg via INTRAVENOUS

## 2022-03-19 MED ORDER — ONDANSETRON HCL 4 MG/2ML IJ SOLN
INTRAMUSCULAR | Status: DC | PRN
  Administered 2022-03-19: 4 mg via INTRAVENOUS

## 2022-03-19 MED ORDER — ROCURONIUM BROMIDE 10 MG/ML (PF) SYRINGE
PREFILLED_SYRINGE | INTRAVENOUS | Status: AC
  Filled 2022-03-19: qty 10

## 2022-03-19 MED ORDER — FENTANYL CITRATE (PF) 100 MCG/2ML IJ SOLN
INTRAMUSCULAR | Status: AC
  Filled 2022-03-19: qty 2

## 2022-03-19 MED ORDER — FENTANYL CITRATE PF 50 MCG/ML IJ SOSY
PREFILLED_SYRINGE | INTRAMUSCULAR | Status: AC
  Administered 2022-03-19: 50 ug via INTRAVENOUS
  Filled 2022-03-19: qty 2

## 2022-03-19 MED ORDER — SUGAMMADEX SODIUM 500 MG/5ML IV SOLN
INTRAVENOUS | Status: DC | PRN
  Administered 2022-03-19: 250 mg via INTRAVENOUS

## 2022-03-19 MED ORDER — ACETAMINOPHEN 500 MG PO TABS
1000.0000 mg | ORAL_TABLET | ORAL | Status: AC
  Administered 2022-03-19: 1000 mg via ORAL
  Filled 2022-03-19: qty 2

## 2022-03-19 MED ORDER — "VISTASEAL 4 ML SINGLE DOSE KIT "
4.0000 mL | PACK | Freq: Once | CUTANEOUS | Status: AC
  Administered 2022-03-19: 4 mL via TOPICAL
  Filled 2022-03-19: qty 4

## 2022-03-19 MED ORDER — LIDOCAINE 20MG/ML (2%) 15 ML SYRINGE OPTIME
INTRAMUSCULAR | Status: DC | PRN
  Administered 2022-03-19: 1.5 mg/kg/h via INTRAVENOUS

## 2022-03-19 MED ORDER — PROMETHAZINE HCL 25 MG/ML IJ SOLN
INTRAMUSCULAR | Status: AC
  Filled 2022-03-19: qty 1

## 2022-03-19 MED ORDER — OXYCODONE HCL 5 MG/5ML PO SOLN
5.0000 mg | Freq: Four times a day (QID) | ORAL | Status: DC | PRN
  Administered 2022-03-19 – 2022-03-21 (×6): 5 mg via ORAL
  Filled 2022-03-19 (×6): qty 5

## 2022-03-19 MED ORDER — FENTANYL CITRATE PF 50 MCG/ML IJ SOSY
25.0000 ug | PREFILLED_SYRINGE | INTRAMUSCULAR | Status: DC | PRN
  Administered 2022-03-19: 50 ug via INTRAVENOUS

## 2022-03-19 MED ORDER — CLONAZEPAM 0.5 MG PO TABS
0.5000 mg | ORAL_TABLET | Freq: Every day | ORAL | Status: DC | PRN
  Administered 2022-03-19: 1 mg via ORAL
  Administered 2022-03-21: 0.5 mg via ORAL
  Filled 2022-03-19: qty 1
  Filled 2022-03-19: qty 2

## 2022-03-19 MED ORDER — ORAL CARE MOUTH RINSE: 15.0000 mL | Freq: Once | OROMUCOSAL | Status: AC

## 2022-03-19 MED ORDER — MORPHINE SULFATE (PF) 2 MG/ML IV SOLN
1.0000 mg | INTRAVENOUS | Status: DC | PRN
  Administered 2022-03-19 – 2022-03-20 (×4): 2 mg via INTRAVENOUS
  Filled 2022-03-19 (×4): qty 1

## 2022-03-19 MED ORDER — SODIUM CHLORIDE 0.9 % IV SOLN
2.0000 g | INTRAVENOUS | Status: AC
  Administered 2022-03-19: 2 g via INTRAVENOUS
  Filled 2022-03-19: qty 2

## 2022-03-19 MED ORDER — SCOPOLAMINE 1 MG/3DAYS TD PT72
1.0000 | MEDICATED_PATCH | TRANSDERMAL | Status: DC
  Administered 2022-03-19: 1.5 mg via TRANSDERMAL
  Filled 2022-03-19: qty 1

## 2022-03-19 MED ORDER — SUGAMMADEX SODIUM 500 MG/5ML IV SOLN
INTRAVENOUS | Status: AC
  Filled 2022-03-19: qty 5

## 2022-03-19 MED ORDER — CHLORHEXIDINE GLUCONATE 4 % EX LIQD: Freq: Once | CUTANEOUS | Status: DC

## 2022-03-19 MED ORDER — DEXAMETHASONE SODIUM PHOSPHATE 10 MG/ML IJ SOLN
INTRAMUSCULAR | Status: AC
  Filled 2022-03-19: qty 1

## 2022-03-19 MED ORDER — 0.9 % SODIUM CHLORIDE (POUR BTL) OPTIME
TOPICAL | Status: DC | PRN
  Administered 2022-03-19: 1000 mL

## 2022-03-19 MED ORDER — HEPARIN SODIUM (PORCINE) 5000 UNIT/ML IJ SOLN
5000.0000 [IU] | INTRAMUSCULAR | Status: AC
  Administered 2022-03-19: 5000 [IU] via SUBCUTANEOUS
  Filled 2022-03-19: qty 1

## 2022-03-19 SURGICAL SUPPLY — 104 items
APL LAPSCP 35 DL APL RGD (MISCELLANEOUS) ×2
APL PRP STRL LF DISP 70% ISPRP (MISCELLANEOUS) ×2
APL SWBSTK 6 STRL LF DISP (MISCELLANEOUS) ×1
APPLICATOR COTTON TIP 6 STRL (MISCELLANEOUS) ×1 IMPLANT
APPLICATOR COTTON TIP 6IN STRL (MISCELLANEOUS) ×1
APPLICATOR VISTASEAL 35 (MISCELLANEOUS) ×2 IMPLANT
APPLIER CLIP ROT 13.4 12 LRG (CLIP)
APR CLP LRG 13.4X12 ROT 20 MLT (CLIP)
BAG COUNTER SPONGE SURGICOUNT (BAG) IMPLANT
BAG SPNG CNTER NS LX DISP (BAG) ×1
BLADE EXTENDED COATED 6.5IN (ELECTRODE) IMPLANT
BLADE HEX COATED 2.75 (ELECTRODE) ×1 IMPLANT
BLADE SURG SZ11 CARB STEEL (BLADE) ×1 IMPLANT
CABLE HIGH FREQUENCY MONO STRZ (ELECTRODE) IMPLANT
CHLORAPREP W/TINT 26 (MISCELLANEOUS) ×2 IMPLANT
CLIP APPLIE ROT 13.4 12 LRG (CLIP) IMPLANT
CLIP SUT LAPRA TY ABSORB (SUTURE) ×2 IMPLANT
COVER MAYO STAND STRL (DRAPES) IMPLANT
CUTTER FLEX LINEAR 45M (STAPLE) IMPLANT
DEVICE SUT QUICK LOAD TK 5 (SUTURE) IMPLANT
DEVICE SUT TI-KNOT TK 5X26 (SUTURE) IMPLANT
DEVICE SUTURE ENDOST 10MM (ENDOMECHANICALS) ×1 IMPLANT
DRAIN PENROSE 0.25X18 (DRAIN) ×1 IMPLANT
DRAIN PENROSE 0.5X18 (DRAIN) ×1 IMPLANT
DRAPE LAPAROSCOPIC ABDOMINAL (DRAPES) ×1 IMPLANT
DRAPE UTILITY XL STRL (DRAPES) ×1 IMPLANT
DRAPE WARM FLUID 44X44 (DRAPES) IMPLANT
DRSG IV TEGADERM 3.5X4.5 STRL (GAUZE/BANDAGES/DRESSINGS) IMPLANT
DRSG TEGADERM 2-3/8X2-3/4 SM (GAUZE/BANDAGES/DRESSINGS) ×6 IMPLANT
ELECT REM PT RETURN 15FT ADLT (MISCELLANEOUS) ×1 IMPLANT
GAUZE 4X4 16PLY ~~LOC~~+RFID DBL (SPONGE) ×1 IMPLANT
GAUZE SPONGE 2X2 8PLY STRL LF (GAUZE/BANDAGES/DRESSINGS) ×1 IMPLANT
GAUZE SPONGE 4X4 12PLY STRL (GAUZE/BANDAGES/DRESSINGS) ×1 IMPLANT
GLOVE BIO SURGEON STRL SZ7.5 (GLOVE) ×1 IMPLANT
GLOVE INDICATOR 8.0 STRL GRN (GLOVE) ×1 IMPLANT
GOWN STRL REUS W/ TWL XL LVL3 (GOWN DISPOSABLE) ×4 IMPLANT
GOWN STRL REUS W/TWL XL LVL3 (GOWN DISPOSABLE) ×4
HANDLE SUCTION POOLE (INSTRUMENTS) IMPLANT
IRRIG SUCT STRYKERFLOW 2 WTIP (MISCELLANEOUS) ×1
IRRIGATION SUCT STRKRFLW 2 WTP (MISCELLANEOUS) ×1 IMPLANT
KIT BASIN OR (CUSTOM PROCEDURE TRAY) ×1 IMPLANT
KIT GASTRIC LAVAGE 34FR ADT (SET/KITS/TRAYS/PACK) ×1 IMPLANT
KIT TURNOVER KIT A (KITS) IMPLANT
MARKER SKIN DUAL TIP RULER LAB (MISCELLANEOUS) ×1 IMPLANT
MAT PREVALON FULL STRYKER (MISCELLANEOUS) ×1 IMPLANT
NDL SPNL 22GX3.5 QUINCKE BK (NEEDLE) ×1 IMPLANT
NEEDLE SPNL 22GX3.5 QUINCKE BK (NEEDLE) ×1 IMPLANT
NS IRRIG 1000ML POUR BTL (IV SOLUTION) ×1 IMPLANT
PACK CARDIOVASCULAR III (CUSTOM PROCEDURE TRAY) ×1 IMPLANT
PACK GENERAL/GYN (CUSTOM PROCEDURE TRAY) ×1 IMPLANT
RELOAD 45 VASCULAR/THIN (ENDOMECHANICALS) IMPLANT
RELOAD ENDO STITCH 2.0 (ENDOMECHANICALS) ×9
RELOAD STAPLE 45 2.5 WHT GRN (ENDOMECHANICALS) IMPLANT
RELOAD STAPLE 45 3.5 BLU ETS (ENDOMECHANICALS) IMPLANT
RELOAD STAPLE 60 2.6 WHT THN (STAPLE) ×2 IMPLANT
RELOAD STAPLE 60 3.6 BLU REG (STAPLE) ×2 IMPLANT
RELOAD STAPLE 60 3.8 GOLD REG (STAPLE) ×1 IMPLANT
RELOAD STAPLE TA45 3.5 REG BLU (ENDOMECHANICALS) IMPLANT
RELOAD STAPLER BLUE 60MM (STAPLE) ×5 IMPLANT
RELOAD STAPLER GOLD 60MM (STAPLE) ×1 IMPLANT
RELOAD STAPLER WHITE 60MM (STAPLE) ×2 IMPLANT
RELOAD SUT SNGL STCH ABSRB 2-0 (ENDOMECHANICALS) ×5 IMPLANT
RELOAD SUT SNGL STCH BLK 2-0 (ENDOMECHANICALS) ×4 IMPLANT
SCISSORS LAP 5X45 EPIX DISP (ENDOMECHANICALS) ×1 IMPLANT
SET TUBE SMOKE EVAC HIGH FLOW (TUBING) ×1 IMPLANT
SHEARS HARMONIC ACE PLUS 45CM (MISCELLANEOUS) ×1 IMPLANT
SLEEVE ADV FIXATION 12X100MM (TROCAR) ×2 IMPLANT
SLEEVE ADV FIXATION 5X100MM (TROCAR) IMPLANT
SOL ANTI FOG 6CC (MISCELLANEOUS) ×1 IMPLANT
STAPLER ECHELON BIOABSB 60 FLE (MISCELLANEOUS) IMPLANT
STAPLER ECHELON LONG 60 440 (INSTRUMENTS) ×1 IMPLANT
STAPLER RELOAD BLUE 60MM (STAPLE) ×5
STAPLER RELOAD GOLD 60MM (STAPLE) ×1
STAPLER RELOAD WHITE 60MM (STAPLE) ×2
STAPLER VISISTAT 35W (STAPLE) ×1 IMPLANT
STRIP CLOSURE SKIN 1/2X4 (GAUZE/BANDAGES/DRESSINGS) ×1 IMPLANT
SUCTION POOLE HANDLE (INSTRUMENTS)
SURGILUBE 2OZ TUBE FLIPTOP (MISCELLANEOUS) ×1 IMPLANT
SUT MNCRL AB 4-0 PS2 18 (SUTURE) ×1 IMPLANT
SUT RELOAD ENDO STITCH 2 48X1 (ENDOMECHANICALS) ×5
SUT RELOAD ENDO STITCH 2.0 (ENDOMECHANICALS) ×4
SUT SILK 2 0 (SUTURE)
SUT SILK 2 0 SH CR/8 (SUTURE) IMPLANT
SUT SILK 2-0 18XBRD TIE 12 (SUTURE) IMPLANT
SUT SILK 3 0 (SUTURE)
SUT SILK 3 0 SH CR/8 (SUTURE) IMPLANT
SUT SILK 3-0 18XBRD TIE 12 (SUTURE) IMPLANT
SUT SURGIDAC NAB ES-9 0 48 120 (SUTURE) IMPLANT
SUT VIC AB 2-0 SH 27 (SUTURE) ×1
SUT VIC AB 2-0 SH 27X BRD (SUTURE) ×1 IMPLANT
SUT VICRYL 2 0 18  UND BR (SUTURE)
SUT VICRYL 2 0 18 UND BR (SUTURE) IMPLANT
SUTURE RELOAD END STTCH 2 48X1 (ENDOMECHANICALS) ×5 IMPLANT
SUTURE RELOAD ENDO STITCH 2.0 (ENDOMECHANICALS) ×4 IMPLANT
SYR 20ML LL LF (SYRINGE) ×2 IMPLANT
TAPE STRIPS DRAPE STRL (GAUZE/BANDAGES/DRESSINGS) IMPLANT
TOWEL OR 17X26 10 PK STRL BLUE (TOWEL DISPOSABLE) ×2 IMPLANT
TOWEL OR NON WOVEN STRL DISP B (DISPOSABLE) ×1 IMPLANT
TRAY FOLEY MTR SLVR 16FR STAT (SET/KITS/TRAYS/PACK) IMPLANT
TROCAR ADV FIXATION 12X100MM (TROCAR) ×1 IMPLANT
TROCAR ADV FIXATION 5X100MM (TROCAR) ×1 IMPLANT
TROCAR XCEL NON-BLD 5MMX100MML (ENDOMECHANICALS) ×1 IMPLANT
TUBING CONNECTING 10 (TUBING) ×2 IMPLANT
YANKAUER SUCT BULB TIP NO VENT (SUCTIONS) IMPLANT

## 2022-03-19 NOTE — H&P (Signed)
PROVIDER: Jayleon Mcfarlane Leanne Chang, MD  MRN: [id number] DOB: March 10, 1976 DATE OF ENCOUNTER: 03/07/2022 Subjective   Chief Complaint: pre Operative Visit   History of Present Illness: [name] is a 46 y.o. female who is seen today for long-term follow-up regarding her obesity, dyslipidemia, GERD, prediabetes. Initially met the patient on June 30. She weighed 230 pounds at that time.  At that time she has well-controlled heartburn on daily medication. She has had a cholecystectomy, 2 C-sections and a hysterectomy.  She has continued regular follow-up with her hematologist. She saw several specialists for right jaw, right neck and right shoulder discomfort since I last saw her. She denies any chest pain, chest pressure, shortness of breath, orthopnea, TIAs or amaurosis fugax. She denies any abdominal surgery since I saw her last.  She has received nutritional and psychological evaluation and clearance  Review of Systems: A complete review of systems was obtained from the patient. I have reviewed this information and discussed as appropriate with the patient. See HPI as well for other ROS.  ROS   Medical History: Past Medical History:  Diagnosis Date  Allergy  Asthma, unspecified asthma severity, unspecified whether complicated, unspecified whether persistent  GERD (gastroesophageal reflux disease)   Patient Active Problem List  Diagnosis  Chronic sore throat  Otalgia of right ear  Muscle tension dysphonia  Eagle's syndrome  AKI (acute kidney injury) (CMS-HCC)  ANA positive  Anaphylactic reaction due to food, subsequent encounter  Apnea  B12 deficiency  Class 3 severe obesity with serious comorbidity and body mass index (BMI) of 40.0 to 44.9 in adult (CMS-HCC)  Depressive disorder  Endometriosis  Fatty liver  Genital herpes, unspecified  Insomnia  Inappropriate sinus tachycardia  Moderate persistent asthma without complication  Nonorganic sleep disorder, unspecified   Osteoporosis  Other and unspecified hyperlipidemia  PID (acute pelvic inflammatory disease)  Prediabetes  Thoracic or lumbosacral neuritis or radiculitis, unspecified  Thyroid nodule  Personal history of unspecified mental disorder  Headache, migraine  Generalized anxiety disorder  Gastroesophageal reflux disease  SOBOE (shortness of breath on exertion)  Daytime somnolence  Preoperative evaluation of a medical condition to rule out surgical contraindications (TAR required)   Past Surgical History:  Procedure Laterality Date  ANKLE SURGERY Right  ARTHROSCOPY SHOULDER  CESAREAN SECTION  CHOLECYSTECTOMY  COMBINED AUGMENTATION MAMMAPLASTY AND ABDOMINOPLASTY  HYSTERECTOMY  TONSILLECTOMY    Allergies  Allergen Reactions  Aspirin Other (See Comments)  HIVES  Other Hives  [place]   Current Outpatient Medications on File Prior to Visit  Medication Sig Dispense Refill  acetaminophen (TYLENOL) 500 MG tablet Take by mouth  albuterol 90 mcg/actuation inhaler  calcium carbonate 500 mg calcium (1,250 mg) tablet Take 500 mg of elemental by mouth once daily  cetirizine (ZYRTEC) 10 MG tablet  clonazePAM (KLONOPIN) 1 MG tablet Take 1/2 to 1 tablet 2 hours before bedtime 30 tablet 1  DULoxetine (CYMBALTA) 20 MG DR capsule Take 1 capsule (20 mg total) by mouth once daily for 90 days (Patient not taking: Reported on 09/24/2021) 30 capsule 2  ergocalciferol, vitamin D2, 1,250 mcg (50,000 unit) capsule 50,000 Units once a week  esomeprazole (NEXIUM) 40 MG DR capsule esomeprazole magnesium 40 mg capsule,delayed release TAKE 1 CAPSULE (40 MG TOTAL) BY MOUTH DAILY AT 12 NOON.  fluticasone furoate-vilanteroL (BREO ELLIPTA) 100-25 mcg/dose DsDv inhaler  multivitamin tablet Take 1 tablet by mouth once daily   No current facility-administered medications on file prior to visit.   Family History  Problem Relation Age  of Onset  Anesthesia problems Neg Hx    Social History   Tobacco Use   Smoking Status Never  Smokeless Tobacco Never    Social History   Socioeconomic History  Marital status: Divorced  Number of children: 2  Tobacco Use  Smoking status: Never  Smokeless tobacco: Never  Vaping Use  Vaping Use: Never used  Substance and Sexual Activity  Alcohol use: Yes  Comment: ONCE MONTHLY  Drug use: Never  Sexual activity: Defer  Partners: Male   Objective:   Vitals:  03/07/22 0901  BP: 100/80  Pulse: 61  Temp: (!) 35 C (95 F)  SpO2: 98%  Weight: (!) 110.7 kg (244 lb)  Height: 162.6 cm (_0 )   Body mass index is 41.88 kg/m.  Gen: alert, NAD, non-toxic appearing, severe obesity Pupils: equal, no scleral icterus Pulm: Lungs clear to auscultation, symmetric chest rise CV: regular rate and rhythm Abd: soft, nontender, nondistended. old trocar sites. No cellulitis. No incisional hernia Ext: no edema,  Skin: no rash, no jaundice  Labs, Imaging and Diagnostic Testing: Sees hem for anemia/iron, iv iron 8/18; hem note 12/07/21; February 04, 2022 1) Microcytic Anemia due to iron deficiency 2) Alpha thalassemia trait   CBC January 28, 2022 showed an improved hemoglobin of 11, hematocrit 35, platelet count 254; normal comprehensive metabolic panel; iron 80, ferritin 136, vitamin B12 614  Neg home sleep test 02/04/22 Nml PFT 10/05/21  CT abdomen pelvis July 31, 2021-negative for acute intra-abdominal pathology  Dentist office note from January 01, 2022  Egd 12/19/2020 dr armbruster  Z-line at 38 cm, GE junction at 38 cm. 2 cm hiatal hernia. Exam esophagus otherwise normal. Patchy erythematous Oestreich mucosa but no ulcer.  pH probe 2019 -good acid suppression on PPI, good correlation for regurgitation with reflux events.  Manometry 2019 No significant esophageal peristaltic abnormality  Upper GI April 2021 FINDINGS: Fluoroscopic evaluation demonstrates normal caliber and smooth contour of the esophagus. No evidence of fixed stricture, mass  or mucosal abnormality. Mild intermittent esophageal dysmotility with tertiary contractions. There is a small sliding hiatal hernia. No gastroesophageal reflux was observed. The patient swallowed a 13 mm barium tablet, which freely passed into the stomach.  IMPRESSION: Mild intermittent esophageal dysmotility.  Small sliding hiatal hernia.  Otherwise unremarkable esophagram as described.  CT abdomen pelvis July 31, 2021-no acute abnormality  Gastric emptying study 2019 FINDINGS: Expected location of the stomach in the left upper quadrant. Ingested meal empties the stomach gradually over the course of the study.  34.2% emptied at 1 hr ( normal >= 10%)  52.0% emptied at 2 hr ( normal >= 40%)  64.9% emptied at 3 hr ( normal >= 70%)  97.0 % emptied at 4 hr ( normal >= 90%)  IMPRESSION: Overall normal study.  Assessment and Plan:  Diagnoses and all orders for this visit:  Class 3 severe obesity due to excess calories with serious comorbidity and body mass index (BMI) of 40.0 to 44.9 in adult (CMS-HCC)  Gastroesophageal reflux disease, unspecified whether esophagitis present  Prediabetes  Hypercholesteremia  History of anemia  Sliding hiatal hernia    We reviewed her labs, imaging studies. We discussed the finding of a sliding hiatal hernia on her upper endoscopy from a few years. We discussed that we would test for 1 intraoperatively. If found to have a clinic significant 1 we discussed that we would repair it. We discussed the steps of a hiatal hernia repair. We discussed the risk of recurrence.  She has attended her preoperative education class. We discussed the typical hospitalization again. We discussed the typical issues that we see immediately after surgery. She has read and signed surgical consent form. I offered to rediscuss the steps of the procedure along with risk and benefits but she declined. I encouraged her to contact the office if she had any questions  between now and surgery. We discussed the importance of the preoperative meal plan.   Leighton Ruff. Redmond Pulling, MD, FACS General, Bariatric, & Minimally Invasive Surgery [facility],  [place]

## 2022-03-19 NOTE — Interval H&P Note (Signed)
History and Physical Interval Note:  03/19/2022 9:19 AM  [name]  has presented today for surgery, with the diagnosis of MORBID OBESITY.  The various methods of treatment have been discussed with the patient and family. After consideration of risks, benefits and other options for treatment, the patient has consented to  Procedure(s): LAPAROSCOPIC ROUX-EN-Y GASTRIC BYPASS WITH UPPER ENDOSCOPY (N/A) HERNIA REPAIR HIATAL (N/A) as a surgical intervention.  The patient's history has been reviewed, patient examined, no change in status, stable for surgery.  I have reviewed the patient's chart and labs.  Questions were answered to the patient's satisfaction.     Gaynelle Adu

## 2022-03-19 NOTE — Anesthesia Postprocedure Evaluation (Signed)
Anesthesia Post Note  Patient: [name]  Procedure(s) Performed: LAPAROSCOPIC ROUX-EN-Y GASTRIC BYPASS WITH UPPER ENDOSCOPY HERNIA REPAIR HIATAL     Patient location during evaluation: PACU Anesthesia Type: General Level of consciousness: awake Pain management: pain level controlled Vital Signs Assessment: post-procedure vital signs reviewed and stable Respiratory status: spontaneous breathing, nonlabored ventilation and respiratory function stable Cardiovascular status: blood pressure returned to baseline and stable Postop Assessment: no apparent nausea or vomiting Anesthetic complications: no   No notable events documented.  Last Vitals:  Vitals:   03/19/22 1515 03/19/22 1600  BP: (!) 138/90 (!) 153/97  Pulse: 92 (!) 101  Resp: 15 16  Temp:  37 C  SpO2: 95% 98%    Last Pain:  Vitals:   03/19/22 1600  TempSrc: Oral  PainSc:                  Linton Rump

## 2022-03-19 NOTE — Discharge Instructions (Signed)

## 2022-03-19 NOTE — Op Note (Signed)
[name] [id number] [birth year]. 03/19/2022  Preoperative diagnosis:  Class 3 severe obesity BMI 41  Gastroesophageal reflux disease, unspecified whether esophagitis present  Prediabetes  Hypercholesteremia  History of anemia  Sliding hiatal hernia   Postoperative  diagnosis:  1. same  Surgical procedure: Laparoscopic Roux-en-Y gastric bypass (ante-colic, ante-gastric) with sliding hiatal hernia repair; upper endoscopy  Surgeon: Atilano Ina, M.D. FACS  Asst.: Feliciana Rossetti MD FACS  Anesthesia: General plus exparel/marcaine mix  Complications: None   EBL: Minimal   Drains: None   Disposition: PACU in good condition   Indications for procedure: 46 y.o. yo female with morbid obesity who has been unsuccessful at sustained weight loss. The patient's comorbidities are listed above. We discussed the risk and benefits of surgery including but not limited to anesthesia risk, bleeding, infection, blood clot formation, anastomotic leak, anastomotic stricture, ulcer formation, death, respiratory complications, intestinal blockage, internal hernia, gallstone formation, vitamin and nutritional deficiencies, injury to surrounding structures, failure to lose weight and mood changes.   Description of procedure: Patient is brought to the operating room and general anesthesia induced. The patient had received preoperative broad-spectrum IV antibiotics and subcutaneous heparin. The abdomen was widely sterilely prepped with Chloraprep and draped. Patient timeout was performed and correct patient and procedure confirmed. Access was obtained with a 5 mm Optiview trocar in the left upper quadrant and pneumoperitoneum established without difficulty. Under direct vision 12 mm trocars were placed laterally in the right upper quadrant, right upper quadrant midclavicular line, and to the left and above the umbilicus for the camera port. A 5 mm trocar was placed laterally in the left upper quadrant.   Exparel/marcaine mix was infiltrated in bilateral lateral abdominal walls as a TAP block for posteroperative pain relief.  The omentum was brought into the upper abdomen and the transverse mesocolon elevated and the ligament of Treitz clearly identified. A 50 cm biliopancreatic limb was then carefully measured from the ligament of Treitz. The small intestine was divided at this point with a single firing of the white load linear stapler. A Penrose drain was sutured to the end of the Roux-en-Y limb for later identification. A 100 cm Roux-en-Y limb was then carefully measured. At this point a side-to-side anastomosis was created between the Roux limb and the end of the biliopancreatic limb. This was accomplished with a single firing of the 60 mm white load linear stapler. The common enterotomy was closed with a running 2-0 Vicryl begun at either end of the enterotomy and tied centrally. Vistaseal tissue sealant was placed over the anastomosis. The mesenteric defect was then closed with running 2-0 silk. The omentum was then divided with the harmonic scalpel up towards the transverse colon to allow mobility of the Roux limb toward the gastric pouch. The patient was then placed in steep reversed Trendelenburg. Through a 5 mm subxiphoid site the [facility] retractor was placed and the left lobe of the liver elevated with excellent exposure of the upper stomach and hiatus.   The patient's preoperative upper endoscopy showed 2 cm hiatal hernia.  The patient clearly had a hiatal hernia we visualized the diaphragm.  The gastrohepatic ligament was incised with harmonic scalpel.  The right crus of the diaphragm was identified.  I carefully incised the peritoneum slightly medial to the right crus and started some gentle blunt dissection with the Prestige grasper.  The esophagus was identified.  I was able to identify the confluence of the left and right crura.  I continued taking down  the lateral attachments of the right crus  of the diaphragm and across anteriorly ensuring that the harmonic blade was away from the esophagus.  I was able to get a window behind the esophagus and lifted up.  We did some circumferential mobilization of the esophagus within the mediastinum.  I had achieved 3 cm of intra-abdominal esophageal length.  I then reapproximated the left and right crura with 2 interrupted 0 Ethibond Surgidac sutures each secured with a titanium tie knot.  The angle of Hiss was then mobilized with the harmonic scalpel. A 5cm gastric pouch was then carefully measured along the lesser curve of the stomach. Dissection was carried along the lesser curve at this point with the Harmonic scalpel working carefully back toward the lesser sac at right angles to the lesser curve. The free lesser sac was then entered. After being sure all tubes were removed from the stomach an initial firing of the gold load 60 mm linear stapler was fired at right angles across the lesser curve for about 4 cm. The gastric pouch was further mobilized posteriorly and then the pouch was completed with 4 further firings of the 60 mm blue load linear stapler up through the previously dissected angle of His. It was ensured that the pouch was completely mobilized away from the gastric remnant. This created a nice tubular 4-5 cm gastric pouch. The Roux limb was then brought up in an antecolic fashion with the candycane facing to the patient's left without undue tension. The gastrojejunostomy was created with an initial posterior row of 2-0 Vicryl between the Roux limb and the staple line of the gastric pouch. Enterotomies were then made in the gastric pouch and the Roux limb with the harmonic scalpel and at approximately 2-2-1/2 cm anastomosis was created with a single firing of the 36mm blue load linear stapler. The staple line was inspected and was intact without bleeding. The common enterotomy was then closed with running 2-0 Vicryl begun at either end and tied  centrally. The Ewall tube was then easily passed through the anastomosis and an outer anterior layer of running 2-0 Vicryl was placed. The Ewald tube was removed. With the outlet of the gastrojejunostomy clamped and under saline irrigation the assistant performed upper endoscopy and with the gastric pouch tensely distended with air-there was no evidence of leak on this test. The pouch was desufflated. The Vonita Moss defect was closed with running 2-0 silk. The abdomen was inspected for any evidence of bleeding or bowel injury and everything looked fine. The Nathanson retractor was removed under direct vision after coating the anastomosis with vistaseal tissue sealant. All CO2 was evacuated and trochars removed. Skin incisions were closed with 4-0 monocryl in a subcuticular fashion followed by  steri-strips and bandages. Sponge needle and instrument counts were correct. The patient was taken to the PACU in good condition.    Mary Sella. Andrey Campanile, MD, FACS General, Bariatric, & Minimally Invasive Surgery [facility], [place]

## 2022-03-19 NOTE — Op Note (Signed)
Preoperative diagnosis: Roux-en-Y gastric bypass ? ?Postoperative diagnosis: Same  ? ?Procedure: Upper endoscopy  ? ?Surgeon: Rilyn Scroggs, M.D. ? ?Anesthesia: Gen.  ? ?Indications for procedure: This patient was undergoing a Roux-en-Y gastric bypass.  ? ?Description of procedure: The endoscopy was placed in the mouth and into the oropharynx and under endoscopic vision it was advanced to the esophagogastric junction.  The stomach was insufflated and no bleeding or bubbles were seen.  The GEJ was identified at 39cm from the teeth. The anastomosis was seen at 44 cm. No bleeding or leaks were detected. The scope was withdrawn without difficulty.   ? ?Saurabh Hettich, M.D. ?General, Bariatric, & Minimally Invasive Surgery ?[facility] ? ? ?

## 2022-03-19 NOTE — Progress Notes (Signed)
   03/19/22 1600  Assess: MEWS Score  Temp 98.6 F (37 C)  BP (!) 153/97  MAP (mmHg) 114  Pulse Rate (!) 101  Resp 16  SpO2 98 %  Assess: MEWS Score  MEWS Temp 0  MEWS Systolic 0  MEWS Pulse 1  MEWS RR 0  MEWS LOC 1  MEWS Score 2  MEWS Score Color Yellow  Assess: if the MEWS score is Yellow or Red  Were vital signs taken at a resting state? Yes  Focused Assessment No change from prior assessment  Does the patient meet 2 or more of the SIRS criteria? Yes  Does the patient have a confirmed or suspected source of infection? No  MEWS guidelines implemented *See Row Information* Yes  Treat  MEWS Interventions Administered prn meds/treatments  Take Vital Signs  Increase Vital Sign Frequency  Yellow: Q 2hr X 2 then Q 4hr X 2, if remains yellow, continue Q 4hrs  Escalate  MEWS: Escalate Yellow: discuss with charge nurse/RN and consider discussing with provider and RRT  Notify: Charge Nurse/RN  Name of Charge Nurse/RN Notified Darl Pikes RN  Date Charge Nurse/RN Notified 03/19/22  Time Charge Nurse/RN Notified 1630  Document  Patient Outcome Stabilized after interventions  Progress note created (see row info) Yes  Assess: SIRS CRITERIA  SIRS Temperature  0  SIRS Pulse 1  SIRS Respirations  0  SIRS WBC 1  SIRS Score Sum  2

## 2022-03-19 NOTE — Progress Notes (Signed)
PHARMACY CONSULT FOR:  Risk Assessment for Post-Discharge VTE Following Bariatric Surgery  Post-Discharge VTE Risk Assessment: This patient's probability of 30-day post-discharge VTE is increased due to the factors marked:  Sleeve gastrectomy   Liver disorder (transplant, cirrhosis, or nonalcoholic steatohepatitis)   Hx of VTE   Hemorrhage requiring transfusion   GI perforation, leak, or obstruction   ====================================================    Female    Age >/=60 years    BMI >/=50 kg/m2    CHF    Dyspnea at Rest    Paraplegia   X Non-gastric-band surgery    Operation Time >/=3 hr    Return to OR     Length of Stay >/= 3 d   Hypercoagulable condition   Significant venous stasis    Predicted probability of 30-day post-discharge VTE:  0.16%   MILD    Other patient-specific factors to consider:   Recommendation for Discharge: No pharmacologic prophylaxis post-discharge   [name] is a 46 y.o. female who underwent  laparoscopic Roux-en-Y gastric bypass on 03/19/2022.    Case start: 1058 Case end: 1259   Allergies  Allergen Reactions   Aspirin Other (See Comments)    MD advised pt to not take med   Ibuprofen Other (See Comments)    MD advised pt to not take med    Rosanne Ashing Allergy] Swelling   Zoledronic Acid Other (See Comments)    Muscle aches    Patient Measurements: Height: 5\' 4"  (162.6 cm) Weight: 107.9 kg (237 lb 12.8 oz) IBW/kg (Calculated) : 54.7 Body mass index is 40.82 kg/m.  No results for input(s): "WBC", "HGB", "HCT", "PLT", "APTT", "CREATININE", "LABCREA", "CREAT24HRUR", "MG", "PHOS", "ALBUMIN", "PROT", "AST", "ALT", "ALKPHOS", "BILITOT", "BILIDIR", "IBILI" in the last 72 hours. Estimated Creatinine Clearance: 105.4 mL/min (by C-G formula based on SCr of 0.7 mg/dL).    Past Medical History:  Diagnosis Date   Allergy    Anemia    Anxiety    Apnea 12/08/2019   Arthritis    Asthma    Back pain    Constipation     Exertional dyspnea 03/14/2020   Fatty liver    GERD (gastroesophageal reflux disease)    H/O: hysterectomy    Hx of endometriosis    Inappropriate sinus tachycardia 12/08/2019   Insomnia    Joint pain    Lactose intolerance    Migraine    Morbid obesity (HCC) 12/08/2019   Osteoporosis    Urticaria    Vitamin D deficiency      Medications Prior to Admission  Medication Sig Dispense Refill Last Dose   acetaminophen (TYLENOL) 500 MG tablet Take 1,000 mg by mouth every 6 (six) hours as needed for moderate pain.   Past Week   albuterol (VENTOLIN HFA) 108 (90 Base) MCG/ACT inhaler TAKE 2 PUFFS BY MOUTH EVERY 6 HOURS AS NEEDED FOR WHEEZE OR SHORTNESS OF BREATH (Patient taking differently: Inhale 2 puffs into the lungs every 6 (six) hours as needed for wheezing or shortness of breath.) 6.7 each 11    b complex vitamins capsule Take 1 capsule by mouth daily.   Past Week   calcium carbonate (OS-CAL - DOSED IN MG OF ELEMENTAL CALCIUM) 1250 (500 Ca) MG tablet Take 1 tablet by mouth daily with breakfast.   Past Week   cetirizine (ZYRTEC) 10 MG tablet TAKE 1 TABLET (10 MG TOTAL) BY MOUTH DAILY AS NEEDED FOR RHINITIS. (Patient taking differently: Take 10 mg by mouth daily as needed for rhinitis or  allergies.) 90 tablet 1 Past Month   clonazePAM (KLONOPIN) 1 MG tablet Take 0.5-1 mg by mouth daily as needed (sleep).   Past Week   EPINEPHrine (EPIPEN 2-PAK) 0.3 mg/0.3 mL IJ SOAJ injection Inject 0.3 mg into the muscle as needed for anaphylaxis. 2 each 2    esomeprazole (NEXIUM) 40 MG capsule Take 1 capsule (40 mg total) by mouth 2 (two) times daily before a meal. Please schedule a follow up appointment for further refills. (Patient taking differently: Take 40 mg by mouth daily. Please schedule a follow up appointment for further refills.) 60 capsule 1 03/19/2022   famotidine (PEPCID) 20 MG tablet Take 20 mg by mouth daily as needed for heartburn or indigestion.   Past Week   fluticasone furoate-vilanterol  (BREO ELLIPTA) 100-25 MCG/INH AEPB Inhale 1 puff into the lungs daily as needed. (Patient taking differently: Inhale 1 puff into the lungs daily as needed (shortness of breath).) 60 each 11    HYDROcodone-acetaminophen (NORCO) 7.5-325 MG tablet Take 1 tablet by mouth every 8 (eight) hours as needed for moderate pain.   03/18/2022   Multiple Vitamin (MULTIVITAMIN WITH MINERALS) TABS tablet Take 1 tablet by mouth daily.   Past Week   RESTASIS 0.05 % ophthalmic emulsion Place 1 drop into both eyes 2 (two) times daily as needed (dryness).      tiZANidine (ZANAFLEX) 4 MG tablet Take 4 mg by mouth every 6 (six) hours as needed for muscle spasms.   Past Month   Vitamin D, Ergocalciferol, (DRISDOL) 1.25 MG (50000 UNIT) CAPS capsule TAKE 1 CAPSULE (50,000 UNITS TOTAL) BY MOUTH EVERY 7 (SEVEN) DAYS 13 capsule 4 Past Week    Lynann Beaver PharmD, BCPS [facility] main pharmacy [phone] 03/19/2022 4:04 PM

## 2022-03-19 NOTE — Progress Notes (Signed)
Discussed QI "Goals for Discharge" document with patient including ambulation in halls, Incentive Spirometry use every hour, and oral care.  Also discussed pain and nausea control.  Enabled or verified head of bed 30 degree alarm activated.  BSTOP education provided including BSTOP information guide, "Guide for Pain Management after your Bariatric Procedure".  Diet progression education provided including "Bariatric Surgery Post-Op Food Plan Phase 1: Liquids".  Pt was having gas pain, communicated update to staff. Questions answered.Continue to assess.

## 2022-03-19 NOTE — Anesthesia Procedure Notes (Signed)
Procedure Name: Intubation Date/Time: 03/19/2022 10:37 AM  Performed by: Angelo Prindle D, CRNAPre-anesthesia Checklist: Patient identified, Emergency Drugs available, Suction available and Patient being monitored Patient Re-evaluated:Patient Re-evaluated prior to induction Oxygen Delivery Method: Circle system utilized Preoxygenation: Pre-oxygenation with 100% oxygen Induction Type: IV induction Ventilation: Mask ventilation without difficulty Laryngoscope Size: Mac and 4 Grade View: Grade I Tube type: Oral Tube size: 7.0 mm Number of attempts: 1 Airway Equipment and Method: Stylet and Oral airway Placement Confirmation: ETT inserted through vocal cords under direct vision, positive ETCO2 and breath sounds checked- equal and bilateral Secured at: 22 cm Tube secured with: Tape Dental Injury: Teeth and Oropharynx as per pre-operative assessment

## 2022-03-19 NOTE — Transfer of Care (Signed)
Immediate Anesthesia Transfer of Care Note  Patient: [name]  Procedure(s) Performed: LAPAROSCOPIC ROUX-EN-Y GASTRIC BYPASS WITH UPPER ENDOSCOPY HERNIA REPAIR HIATAL  Patient Location: PACU  Anesthesia Type:General  Level of Consciousness: awake, alert , and oriented  Airway & Oxygen Therapy: Patient Spontanous Breathing and Patient connected to face mask oxygen  Post-op Assessment: Report given to RN and Post -op Vital signs reviewed and stable  Post vital signs: Reviewed and stable  Last Vitals:  Vitals Value Taken Time  BP    Temp    Pulse 84 03/19/22 1309  Resp 13 03/19/22 1309  SpO2 100 % 03/19/22 1309  Vitals shown include unvalidated device data.  Last Pain:  Vitals:   03/19/22 0912  TempSrc:   PainSc: 0-No pain         Complications: No notable events documented.

## 2022-03-20 ENCOUNTER — Encounter (HOSPITAL_COMMUNITY): Payer: Self-pay | Admitting: General Surgery

## 2022-03-20 LAB — COMPREHENSIVE METABOLIC PANEL
ALT: 41 U/L (ref 0–44)
AST: 46 U/L — ABNORMAL HIGH (ref 15–41)
Albumin: 4.4 g/dL (ref 3.5–5.0)
Alkaline Phosphatase: 58 U/L (ref 38–126)
Anion gap: 7 (ref 5–15)
BUN: 7 mg/dL (ref 6–20)
CO2: 26 mmol/L (ref 22–32)
Calcium: 8.9 mg/dL (ref 8.9–10.3)
Chloride: 103 mmol/L (ref 98–111)
Creatinine, Ser: 0.67 mg/dL (ref 0.44–1.00)
GFR, Estimated: >60 mL/min (ref >60)
Glucose, Bld: 138 mg/dL — ABNORMAL HIGH (ref 70–99)
Potassium: 3.9 mmol/L (ref 3.5–5.1)
Sodium: 136 mmol/L (ref 135–145)
Total Bilirubin: 0.4 mg/dL (ref 0.3–1.2)
Total Protein: 7.9 g/dL (ref 6.5–8.1)

## 2022-03-20 LAB — CBC WITH DIFFERENTIAL/PLATELET
Abs Immature Granulocytes: 0.03 10*3/uL (ref 0.00–0.07)
Basophils Absolute: 0 10*3/uL (ref 0.0–0.1)
Basophils Relative: 0 %
Eosinophils Absolute: 0 10*3/uL (ref 0.0–0.5)
Eosinophils Relative: 0 %
HCT: 36.6 % (ref 36.0–46.0)
Hemoglobin: 11.4 g/dL — ABNORMAL LOW (ref 12.0–15.0)
Immature Granulocytes: 0 %
Lymphocytes Relative: 15 %
Lymphs Abs: 1.5 10*3/uL (ref 0.7–4.0)
MCH: 24.8 pg — ABNORMAL LOW (ref 26.0–34.0)
MCHC: 31.1 g/dL (ref 30.0–36.0)
MCV: 79.7 fL — ABNORMAL LOW (ref 80.0–100.0)
Monocytes Absolute: 0.7 10*3/uL (ref 0.1–1.0)
Monocytes Relative: 7 %
Neutro Abs: 7.6 10*3/uL (ref 1.7–7.7)
Neutrophils Relative %: 78 %
Platelets: 312 10*3/uL (ref 150–400)
RBC: 4.59 MIL/uL (ref 3.87–5.11)
RDW: 13.8 % (ref 11.5–15.5)
WBC: 9.9 10*3/uL (ref 4.0–10.5)
nRBC: 0 % (ref 0.0–0.2)

## 2022-03-20 MED ORDER — KETOROLAC TROMETHAMINE 15 MG/ML IJ SOLN
15.0000 mg | Freq: Once | INTRAMUSCULAR | Status: AC
  Administered 2022-03-20: 15 mg via INTRAVENOUS
  Filled 2022-03-20: qty 1

## 2022-03-20 NOTE — Plan of Care (Signed)
  Problem: Coping: Goal: Development of coping mechanisms to deal with changes in body function or appearance will improve Outcome: Progressing   Problem: Pain Management: Goal: Pain level will decrease Outcome: Progressing

## 2022-03-20 NOTE — Progress Notes (Signed)
1 Day Post-Op   Subjective/Chief Complaint: Pt having upper abd pain. Not worse with oral intake. Worse with mvmt, deep breathing Walked some Only on about 4th cup of water    Objective: Vital signs in last 24 hours: Temp:  [98 F (36.7 C)-99.3 F (37.4 C)] 98.6 F (37 C) (11/15 1247) Pulse Rate:  [76-100] 89 (11/15 1247) Resp:  [15-18] 17 (11/15 1247) BP: (141-162)/(83-99) 145/92 (11/15 1247) SpO2:  [96 %-100 %] 100 % (11/15 1247) Last BM Date : 03/18/22  Intake/Output from previous day: 11/14 0701 - 11/15 0700 In: 2442.1 [P.O.:300; I.V.:2087.1; IV Piggyback:55] Out: -  Intake/Output this shift: Total I/O In: 577.4 [P.O.:90; I.V.:432.4; IV Piggyback:55] Out: 1800 [Urine:1800]  Alert, nontoxic, not ill appearing Symm chest rise Nonlabored Soft, obese, mild approp TTP, dressings ok +scds  Lab Results:  Recent Labs    03/19/22 1700 03/20/22 0427  WBC 14.0* 9.9  HGB 12.2 11.4*  HCT 39.0 36.6  PLT 328 312   BMET Recent Labs    03/20/22 0427  NA 136  K 3.9  CL 103  CO2 26  GLUCOSE 138*  BUN 7  CREATININE 0.67  CALCIUM 8.9   PT/INR No results for input(s): "LABPROT", "INR" in the last 72 hours. ABG No results for input(s): "PHART", "HCO3" in the last 72 hours.  Invalid input(s): "PCO2", "PO2"  Studies/Results: No results found.  Anti-infectives: Anti-infectives (From admission, onward)    Start     Dose/Rate Route Frequency Ordered Stop   03/19/22 0845  cefoTEtan (CEFOTAN) 2 g in sodium chloride 0.9 % 100 mL IVPB        2 g 200 mL/hr over 30 Minutes Intravenous On call to O.R. 03/19/22 0833 03/19/22 1039       Assessment/Plan: s/p Procedure(s): LAPAROSCOPIC ROUX-EN-Y GASTRIC BYPASS WITH UPPER ENDOSCOPY (N/A) HERNIA REPAIR HIATAL (N/A)  No fever. No tachycardia. No clincal signs of leak Think pt is more MSKL perhaps due to liver retractor Oral intake not safe for discharge, high risk for readmit Cont chemical vte prophylaxis Will try 1  dose of toradol and see if that helps. Already on tylenol, robaxin, oxy, morphine  LOS: 1 day    Gaynelle Adu 03/20/2022

## 2022-03-20 NOTE — Progress Notes (Signed)
Patient alert and oriented, pain is controlled. Patient is tolerating fluids, advanced to protein shake today, patient is tolerating well. Reviewed Gastric Bypass discharge instructions with patient and patient is able to articulate understanding. Provided information on BELT program, Support Group and [facility] outpatient pharmacy. All questions answered, will continue to monitor.    

## 2022-03-20 NOTE — TOC Progression Note (Signed)
Transition of Care [facility]) - Progression Note    Patient Details  Name: DRUSCILLA PETSCH MRN: [id number] Date of Birth: 1975-05-28  Transition of Care [facility]) CM/SW Contact  Adrian Prows, RN Phone Number: 03/20/2022, 9:09 AM  Clinical Narrative:      Transition of Care (TOC) Screening Note   Patient Details  Name: KAELYNN IGO Date of Birth: 05-May-1976   Transition of Care [facility] CM/SW Contact:    Adrian Prows, RN Phone Number: 03/20/2022, 9:09 AM    Transition of Care Department [facility]) has reviewed patient and no TOC needs have been identified at this time. We will continue to monitor patient advancement through interdisciplinary progression rounds. If new patient transition needs arise, please place a TOC consult.         Expected Discharge Plan and Services                                                 Social Determinants of Health (SDOH) Interventions    Readmission Risk Interventions     No data to display

## 2022-03-21 LAB — CBC WITH DIFFERENTIAL/PLATELET
Abs Immature Granulocytes: 0.03 10*3/uL (ref 0.00–0.07)
Basophils Absolute: 0 10*3/uL (ref 0.0–0.1)
Basophils Relative: 0 %
Eosinophils Absolute: 0 10*3/uL (ref 0.0–0.5)
Eosinophils Relative: 0 %
HCT: 32.6 % — ABNORMAL LOW (ref 36.0–46.0)
Hemoglobin: 10.2 g/dL — ABNORMAL LOW (ref 12.0–15.0)
Immature Granulocytes: 0 %
Lymphocytes Relative: 34 %
Lymphs Abs: 2.8 10*3/uL (ref 0.7–4.0)
MCH: 25.2 pg — ABNORMAL LOW (ref 26.0–34.0)
MCHC: 31.3 g/dL (ref 30.0–36.0)
MCV: 80.5 fL (ref 80.0–100.0)
Monocytes Absolute: 0.7 10*3/uL (ref 0.1–1.0)
Monocytes Relative: 8 %
Neutro Abs: 4.8 10*3/uL (ref 1.7–7.7)
Neutrophils Relative %: 58 %
Platelets: 280 10*3/uL (ref 150–400)
RBC: 4.05 MIL/uL (ref 3.87–5.11)
RDW: 14.1 % (ref 11.5–15.5)
WBC: 8.3 10*3/uL (ref 4.0–10.5)
nRBC: 0 % (ref 0.0–0.2)

## 2022-03-21 MED ORDER — ONDANSETRON 4 MG PO TBDP: 4.0000 mg | ORAL_TABLET | Freq: Four times a day (QID) | ORAL | 0 refills | Status: DC | PRN

## 2022-03-21 NOTE — Discharge Summary (Signed)
Physician Discharge Summary  [name] HBZ:[id number] DOB: 04/29/1976 DOA: 03/19/2022  PCP: [name], FNP  Admit date: 03/19/2022 Discharge date: 03/21/2022  Recommendations for Outpatient Follow-up:     Follow-up Information     Gaynelle Adu, MD. Go on 04/11/2022.   Specialty: General Surgery Why: Please arrive 15 minutes prior to your appointment @ 2:00pm Contact information: [address] [place] [address] [phone]         Maczis, Hedda Slade, [place]. Go on 05/10/2022.   Specialty: General Surgery Why: Please arrive 15 minutes prior to your appointment at 9:15am with [facility] on behalf of Dr. Andrey Campanile. Happy New Year! Contact information: [address] [facility] [place] [facility] [place] [address] [phone]                Discharge Diagnoses:  Principal Problem:   S/P gastric bypass Class 3 severe obesity BMI 41  Gastroesophageal reflux disease, unspecified whether esophagitis present  Prediabetes  Hypercholesteremia  History of anemia  Sliding hiatal hernia  Surgical Procedure: Laparoscopic Roux-en-Y gastric bypass with sliding hiatal hernia repair, upper endoscopy  Discharge Condition: Good Disposition: Home  Diet recommendation: Postoperative gastric bypass diet  Filed Weights   03/19/22 0837 03/19/22 0912  Weight: 107.9 kg 107.9 kg     Hospital Course:  The patient was admitted for a planned laparoscopic Roux-en-Y gastric bypass. Please see operative note. Preoperatively the patient was given 5000 units of subcutaneous heparin for DVT prophylaxis. ERAS protocol was used. Postoperative prophylactic heparin dosing was started on the evening of postoperative day 0.  The patient was started on ice chips and water on the evening of POD 0 which they tolerated. On postoperative day 1 The patient's diet was advanced to protein shakes which they also tolerated.  But her pain was controlled and her  oral intake was not safe for dc. On POD 2, The patient was ambulating without difficulty. Their vital signs are stable without fever or tachycardia. Their hemoglobin had remained stable. Her pain had improved along with oral intake.  The patient had received discharge instructions and counseling. They were deemed stable for discharge.  BP 129/85 (BP Location: Left Arm)   Pulse 63   Temp 98.3 F (36.8 C) (Oral)   Resp 16   Ht 5\' 4"  (1.626 m)   Wt 107.9 kg   LMP 12/27/2003   SpO2 100%   BMI 40.82 kg/m   Gen: alert, NAD, non-toxic appearing Pupils: equal, no scleral icterus Pulm: Lungs clear to auscultation, symmetric chest rise CV: regular rate and rhythm Abd: soft, min tender, nondistended. No cellulitis. No incisional hernia Ext: no edema, no calf tenderness Skin: no rash, no jaundice  Discharge Instructions  Discharge Instructions     Ambulate hourly while awake   Complete by: As directed    Call MD for:  difficulty breathing, headache or visual disturbances   Complete by: As directed    Call MD for:  persistant dizziness or light-headedness   Complete by: As directed    Call MD for:  persistant nausea and vomiting   Complete by: As directed    Call MD for:  redness, tenderness, or signs of infection (pain, swelling, redness, odor or green/yellow discharge around incision site)   Complete by: As directed    Call MD for:  severe uncontrolled pain   Complete by: As directed    Call MD for:  temperature >101 F   Complete by: As directed  Diet bariatric full liquid   Complete by: As directed    Discharge instructions   Complete by: As directed    See bariatric discharge instructions   Incentive spirometry   Complete by: As directed    Perform hourly while awake      Allergies as of 03/21/2022       Reactions   Aspirin Other (See Comments)   MD advised pt to not take med   Ibuprofen Other (See Comments)   MD advised pt to not take med   Rosanne Ashing Allergy]  Swelling   Zoledronic Acid Other (See Comments)   Muscle aches        Medication List     STOP taking these medications    famotidine 20 MG tablet Commonly known as: PEPCID   Vitamin D (Ergocalciferol) 1.25 MG (50000 UNIT) Caps capsule Commonly known as: DRISDOL       TAKE these medications    acetaminophen 500 MG tablet Commonly known as: TYLENOL Take 1,000 mg by mouth every 6 (six) hours as needed for moderate pain.   albuterol 108 (90 Base) MCG/ACT inhaler Commonly known as: VENTOLIN HFA TAKE 2 PUFFS BY MOUTH EVERY 6 HOURS AS NEEDED FOR WHEEZE OR SHORTNESS OF BREATH What changed: See the new instructions.   b complex vitamins capsule Take 1 capsule by mouth daily.   calcium carbonate 1250 (500 Ca) MG tablet Commonly known as: OS-CAL - dosed in mg of elemental calcium Take 1 tablet by mouth daily with breakfast.   cetirizine 10 MG tablet Commonly known as: ZYRTEC TAKE 1 TABLET (10 MG TOTAL) BY MOUTH DAILY AS NEEDED FOR RHINITIS. What changed: reasons to take this   clonazePAM 1 MG tablet Commonly known as: KLONOPIN Take 0.5-1 mg by mouth daily as needed (sleep).   EPINEPHrine 0.3 mg/0.3 mL Soaj injection Commonly known as: EpiPen 2-Pak Inject 0.3 mg into the muscle as needed for anaphylaxis.   esomeprazole 40 MG capsule Commonly known as: NEXIUM Take 1 capsule (40 mg total) by mouth 2 (two) times daily before a meal. Please schedule a follow up appointment for further refills. What changed: when to take this   fluticasone furoate-vilanterol 100-25 MCG/INH Aepb Commonly known as: Breo Ellipta Inhale 1 puff into the lungs daily as needed. What changed: reasons to take this   HYDROcodone-acetaminophen 7.5-325 MG tablet Commonly known as: NORCO Take 1 tablet by mouth every 8 (eight) hours as needed for moderate pain.   multivitamin with minerals Tabs tablet Take 1 tablet by mouth daily.   ondansetron 4 MG disintegrating tablet Commonly known as:  ZOFRAN-ODT Take 1 tablet (4 mg total) by mouth every 6 (six) hours as needed for nausea or vomiting.   Restasis 0.05 % ophthalmic emulsion Generic drug: cycloSPORINE Place 1 drop into both eyes 2 (two) times daily as needed (dryness).   tiZANidine 4 MG tablet Commonly known as: ZANAFLEX Take 4 mg by mouth every 6 (six) hours as needed for muscle spasms.        Follow-up Information     Gaynelle Adu, MD. Go on 04/11/2022.   Specialty: General Surgery Why: Please arrive 15 minutes prior to your appointment @ 2:00pm Contact information: [address] [place] [address] [phone]         Maczis, Hedda Slade, [place]. Go on 05/10/2022.   Specialty: General Surgery Why: Please arrive 15 minutes prior to your appointment at 9:15am with [facility] on behalf of Dr. Andrey Campanile. Happy  New Year! Contact information: [address] [facility] [place] [place] [address] [phone]                  The results of significant diagnostics from this hospitalization (including imaging, microbiology, ancillary and laboratory) are listed below for reference.    Significant Diagnostic Studies: No results found.  Labs: Basic Metabolic Panel: Recent Labs  Lab 03/20/22 0427  NA 136  K 3.9  CL 103  CO2 26  GLUCOSE 138*  BUN 7  CREATININE 0.67  CALCIUM 8.9   Liver Function Tests: Recent Labs  Lab 03/20/22 0427  AST 46*  ALT 41  ALKPHOS 58  BILITOT 0.4  PROT 7.9  ALBUMIN 4.4    CBC: Recent Labs  Lab 03/19/22 1700 03/20/22 0427 03/21/22 0401  WBC 14.0* 9.9 8.3  NEUTROABS  --  7.6 4.8  HGB 12.2 11.4* 10.2*  HCT 39.0 36.6 32.6*  MCV 79.8* 79.7* 80.5  PLT 328 312 280    CBG: No results for input(s): "GLUCAP" in the last 168 hours.  Principal Problem:   S/P gastric bypass   Time coordinating discharge: 20 min  Signed:  Atilano Ina, MD [facility], [place] [phone] 03/21/2022, 4:45 PM

## 2022-03-21 NOTE — Plan of Care (Signed)
Problem: Education: Goal: Ability to state signs and symptoms to report to health care provider will improve Outcome: Adequate for Discharge Goal: Knowledge of the prescribed self-care regimen will improve Outcome: Adequate for Discharge Goal: Knowledge of discharge needs will improve Outcome: Adequate for Discharge   Problem: Activity: Goal: Ability to tolerate increased activity will improve Outcome: Adequate for Discharge   Problem: Bowel/Gastric: Goal: Gastrointestinal status for postoperative course will improve Outcome: Adequate for Discharge Goal: Occurrences of nausea will decrease Outcome: Adequate for Discharge   Problem: Coping: Goal: Development of coping mechanisms to deal with changes in body function or appearance will improve Outcome: Adequate for Discharge   Problem: Fluid Volume: Goal: Maintenance of adequate hydration will improve Outcome: Adequate for Discharge   Problem: Nutritional: Goal: Nutritional status will improve Outcome: Adequate for Discharge   Problem: Clinical Measurements: Goal: Will show no signs or symptoms of venous thromboembolism Outcome: Adequate for Discharge Goal: Will remain free from infection Outcome: Adequate for Discharge Goal: Will show no signs of GI Leak Outcome: Adequate for Discharge   Problem: Respiratory: Goal: Will regain and/or maintain adequate ventilation Outcome: Adequate for Discharge   Problem: Pain Management: Goal: Pain level will decrease Outcome: Adequate for Discharge   Problem: Skin Integrity: Goal: Demonstration of wound healing without infection will improve Outcome: Adequate for Discharge   Problem: Education: Goal: Knowledge of General Education information will improve Description: Including pain rating scale, medication(s)/side effects and non-pharmacologic comfort measures Outcome: Adequate for Discharge   Problem: Health Behavior/Discharge Planning: Goal: Ability to manage  health-related needs will improve Outcome: Adequate for Discharge   Problem: Clinical Measurements: Goal: Ability to maintain clinical measurements within normal limits will improve Outcome: Adequate for Discharge Goal: Will remain free from infection Outcome: Adequate for Discharge Goal: Diagnostic test results will improve Outcome: Adequate for Discharge Goal: Respiratory complications will improve Outcome: Adequate for Discharge Goal: Cardiovascular complication will be avoided Outcome: Adequate for Discharge   Problem: Activity: Goal: Risk for activity intolerance will decrease Outcome: Adequate for Discharge   Problem: Nutrition: Goal: Adequate nutrition will be maintained Outcome: Adequate for Discharge   Problem: Coping: Goal: Level of anxiety will decrease Outcome: Adequate for Discharge   Problem: Elimination: Goal: Will not experience complications related to bowel motility Outcome: Adequate for Discharge Goal: Will not experience complications related to urinary retention Outcome: Adequate for Discharge   Problem: Pain Managment: Goal: General experience of comfort will improve Outcome: Adequate for Discharge   Problem: Safety: Goal: Ability to remain free from injury will improve Outcome: Adequate for Discharge   Problem: Skin Integrity: Goal: Risk for impaired skin integrity will decrease Outcome: Adequate for Discharge   Problem: Education: Goal: Ability to state signs and symptoms to report to health care provider will improve Outcome: Adequate for Discharge Goal: Knowledge of the prescribed self-care regimen will improve Outcome: Adequate for Discharge Goal: Knowledge of discharge needs will improve Outcome: Adequate for Discharge   Problem: Activity: Goal: Ability to tolerate increased activity will improve Outcome: Adequate for Discharge   Problem: Bowel/Gastric: Goal: Gastrointestinal status for postoperative course will improve Outcome:  Adequate for Discharge Goal: Occurrences of nausea will decrease Outcome: Adequate for Discharge   Problem: Coping: Goal: Development of coping mechanisms to deal with changes in body function or appearance will improve Outcome: Adequate for Discharge   Problem: Fluid Volume: Goal: Maintenance of adequate hydration will improve Outcome: Adequate for Discharge   Problem: Nutritional: Goal: Nutritional status will improve Outcome: Adequate for Discharge  Problem: Clinical Measurements: Goal: Will show no signs or symptoms of venous thromboembolism Outcome: Adequate for Discharge Goal: Will remain free from infection Outcome: Adequate for Discharge Goal: Will show no signs of GI Leak Outcome: Adequate for Discharge   Problem: Respiratory: Goal: Will regain and/or maintain adequate ventilation Outcome: Adequate for Discharge   Problem: Pain Management: Goal: Pain level will decrease Outcome: Adequate for Discharge   Problem: Skin Integrity: Goal: Demonstration of wound healing without infection will improve Outcome: Adequate for Discharge   Problem: Education: Goal: Knowledge of General Education information will improve Description: Including pain rating scale, medication(s)/side effects and non-pharmacologic comfort measures Outcome: Adequate for Discharge   Problem: Health Behavior/Discharge Planning: Goal: Ability to manage health-related needs will improve Outcome: Adequate for Discharge   Problem: Clinical Measurements: Goal: Ability to maintain clinical measurements within normal limits will improve Outcome: Adequate for Discharge Goal: Will remain free from infection Outcome: Adequate for Discharge Goal: Diagnostic test results will improve Outcome: Adequate for Discharge Goal: Respiratory complications will improve Outcome: Adequate for Discharge Goal: Cardiovascular complication will be avoided Outcome: Adequate for Discharge   Problem: Activity: Goal:  Risk for activity intolerance will decrease Outcome: Adequate for Discharge   Problem: Nutrition: Goal: Adequate nutrition will be maintained Outcome: Adequate for Discharge   Problem: Coping: Goal: Level of anxiety will decrease Outcome: Adequate for Discharge   Problem: Elimination: Goal: Will not experience complications related to bowel motility Outcome: Adequate for Discharge Goal: Will not experience complications related to urinary retention Outcome: Adequate for Discharge   Problem: Pain Managment: Goal: General experience of comfort will improve Outcome: Adequate for Discharge   Problem: Safety: Goal: Ability to remain free from injury will improve Outcome: Adequate for Discharge   Problem: Skin Integrity: Goal: Risk for impaired skin integrity will decrease Outcome: Adequate for Discharge

## 2022-03-21 NOTE — Progress Notes (Signed)
Patient alert and oriented, Post op day 2.  Provided support and encouragement.  Encouraged pulmonary toilet, ambulation and small sips of liquids.  All questions answered.  Will continue to monitor. 

## 2022-03-21 NOTE — Progress Notes (Signed)
Assessment unchanged. Pt verbalized understanding of dc instructions through teach back including medications, follow up care, diet and when to call the doctor. No questions about what London Pepper, RN reviewed with pt yesterday. Discharged via wc to front entrance by NT and family member.

## 2022-03-21 NOTE — Progress Notes (Signed)
Mobility Specialist - Progress Note   03/21/22 0930  Mobility  Activity Ambulated with assistance in hallway  Level of Assistance Modified independent, requires aide device or extra time  Assistive Device  (IV Pole)  Distance Ambulated (ft) 500 ft  Activity Response Tolerated well  Mobility Referral Yes  $Mobility charge 1 Mobility   Pt received in recliner and agreeable to mobility. No complaints during mobility. Pt to recliner after session with all needs met.     [facility]

## 2022-03-23 ENCOUNTER — Encounter (HOSPITAL_COMMUNITY): Payer: Self-pay | Admitting: Emergency Medicine

## 2022-03-23 ENCOUNTER — Other Ambulatory Visit: Payer: Self-pay

## 2022-03-23 ENCOUNTER — Emergency Department (HOSPITAL_COMMUNITY)
Admission: EM | Admit: 2022-03-23 | Discharge: 2022-03-24 | Disposition: A | Discharge status: Home / Self Care | Payer: Commercial Managed Care - HMO | Attending: Emergency Medicine | Admitting: Emergency Medicine

## 2022-03-23 DIAGNOSIS — D649 Anemia, unspecified: Secondary | ICD-10-CM | POA: Diagnosis not present

## 2022-03-23 DIAGNOSIS — E8809 Other disorders of plasma-protein metabolism, not elsewhere classified: Secondary | ICD-10-CM | POA: Insufficient documentation

## 2022-03-23 DIAGNOSIS — E119 Type 2 diabetes mellitus without complications: Secondary | ICD-10-CM | POA: Diagnosis not present

## 2022-03-23 DIAGNOSIS — R1031 Right lower quadrant pain: Secondary | ICD-10-CM

## 2022-03-23 LAB — COMPREHENSIVE METABOLIC PANEL
ALT: 40 U/L (ref 0–44)
AST: 38 U/L (ref 15–41)
Albumin: 4.5 g/dL (ref 3.5–5.0)
Alkaline Phosphatase: 64 U/L (ref 38–126)
Anion gap: 9 (ref 5–15)
BUN: 17 mg/dL (ref 6–20)
CO2: 28 mmol/L (ref 22–32)
Calcium: 9.4 mg/dL (ref 8.9–10.3)
Chloride: 101 mmol/L (ref 98–111)
Creatinine, Ser: 0.75 mg/dL (ref 0.44–1.00)
GFR, Estimated: >60 mL/min (ref >60)
Glucose, Bld: 96 mg/dL (ref 70–99)
Potassium: 4 mmol/L (ref 3.5–5.1)
Sodium: 138 mmol/L (ref 135–145)
Total Bilirubin: 0.5 mg/dL (ref 0.3–1.2)
Total Protein: 8.4 g/dL — ABNORMAL HIGH (ref 6.5–8.1)

## 2022-03-23 LAB — CBC WITH DIFFERENTIAL/PLATELET
Abs Immature Granulocytes: 0.02 10*3/uL (ref 0.00–0.07)
Basophils Absolute: 0 10*3/uL (ref 0.0–0.1)
Basophils Relative: 0 %
Eosinophils Absolute: 0.2 10*3/uL (ref 0.0–0.5)
Eosinophils Relative: 2 %
HCT: 36.6 % (ref 36.0–46.0)
Hemoglobin: 11.4 g/dL — ABNORMAL LOW (ref 12.0–15.0)
Immature Granulocytes: 0 %
Lymphocytes Relative: 24 %
Lymphs Abs: 1.8 10*3/uL (ref 0.7–4.0)
MCH: 25.1 pg — ABNORMAL LOW (ref 26.0–34.0)
MCHC: 31.1 g/dL (ref 30.0–36.0)
MCV: 80.6 fL (ref 80.0–100.0)
Monocytes Absolute: 0.4 10*3/uL (ref 0.1–1.0)
Monocytes Relative: 5 %
Neutro Abs: 5 10*3/uL (ref 1.7–7.7)
Neutrophils Relative %: 69 %
Platelets: 335 10*3/uL (ref 150–400)
RBC: 4.54 MIL/uL (ref 3.87–5.11)
RDW: 13.6 % (ref 11.5–15.5)
WBC: 7.3 10*3/uL (ref 4.0–10.5)
nRBC: 0 % (ref 0.0–0.2)

## 2022-03-23 LAB — URINALYSIS, ROUTINE W REFLEX MICROSCOPIC
Bilirubin Urine: NEGATIVE
Glucose, UA: NEGATIVE mg/dL
Hgb urine dipstick: NEGATIVE
Ketones, ur: NEGATIVE mg/dL
Leukocytes,Ua: NEGATIVE
Nitrite: NEGATIVE
Protein, ur: NEGATIVE mg/dL
Specific Gravity, Urine: 1.005 (ref 1.005–1.030)
pH: 5 (ref 5.0–8.0)

## 2022-03-23 LAB — LIPASE, BLOOD: Lipase: 35 U/L (ref 11–51)

## 2022-03-23 NOTE — Progress Notes (Signed)
2200- Patient needs an IV for the CT scan.

## 2022-03-23 NOTE — ED Triage Notes (Signed)
Pt c/o continued abdominal pain since having gastric bypass and hernia surgery on 11/14.

## 2022-03-23 NOTE — ED Provider Triage Note (Signed)
Emergency Medicine Provider Triage Evaluation Note  [name] , a 46 y.o. female  was evaluated in triage.  Pt complains of abdominal pain that started after having gastric bypass and hernia surgery on 11/14 .  She describes the pain as dull, aching, constant, located on the right upper quadrant and right lower quadrant.  Last bowel movement was this morning with diarrhea.  Denies fever, chest pain, shortness of breath, fever, nausea, vomiting, constipation, urinary symptoms, rash..  Review of Systems  Positive: As above Negative: As above  Physical Exam  BP (!) 137/92   Pulse 74   Temp 98.8 F (37.1 C)   Resp 16   LMP 12/27/2003   SpO2 100%  Gen:   Awake, no distress   Resp:  Normal effort  MSK:   Moves extremities without difficulty  Other:  TTP to right upper quadrant and right lower quadrant  Medical Decision Making  Medically screening exam initiated at 8:05 PM.  Appropriate orders placed.  Lyndel S Krotzer was informed that the remainder of the evaluation will be completed by another provider, this initial triage assessment does not replace that evaluation, and the importance of remaining in the ED until their evaluation is complete.     Jeanelle Malling, [place] 03/23/22 2255

## 2022-03-24 ENCOUNTER — Emergency Department (HOSPITAL_COMMUNITY): Payer: Commercial Managed Care - HMO

## 2022-03-24 ENCOUNTER — Encounter (HOSPITAL_COMMUNITY): Payer: Self-pay

## 2022-03-24 LAB — POC OCCULT BLOOD, ED: Fecal Occult Bld: POSITIVE — AB

## 2022-03-24 MED ORDER — SODIUM CHLORIDE 0.9 % IV BOLUS
500.0000 mL | Freq: Once | INTRAVENOUS | Status: AC
  Administered 2022-03-24: 500 mL via INTRAVENOUS

## 2022-03-24 MED ORDER — ONDANSETRON HCL 4 MG/2ML IJ SOLN
4.0000 mg | Freq: Once | INTRAMUSCULAR | Status: AC
  Administered 2022-03-24: 4 mg via INTRAVENOUS
  Filled 2022-03-24: qty 2

## 2022-03-24 MED ORDER — IOHEXOL 300 MG/ML  SOLN
100.0000 mL | Freq: Once | INTRAMUSCULAR | Status: AC | PRN
  Administered 2022-03-24: 100 mL via INTRAVENOUS

## 2022-03-24 MED ORDER — HYDROMORPHONE HCL 1 MG/ML IJ SOLN
1.0000 mg | Freq: Once | INTRAMUSCULAR | Status: AC
  Administered 2022-03-24: 1 mg via INTRAVENOUS
  Filled 2022-03-24: qty 1

## 2022-03-24 NOTE — ED Provider Notes (Signed)
[facility]-EMERGENCY DEPT Provider Note   CSN: [id number] Arrival date & time: 03/23/22  1851     History  Chief Complaint  Patient presents with   Abdominal Pain    [name] is a 46 y.o. female.  HPI   Medical history including GERD, anxiety, presents with complaints of stomach pain, patient states this started yesterday, states she feels in her right lower quadrant will go around to her back, states pain has become constant, but notes is worsen with certain movements, she has had no nausea or vomiting, still passing gas having normal bowel movements, had a bowel movements today, denies bloody stools or melena, she is not on anticoag's, she denies any urinary symptoms, no fevers no chills, she states that she had gastric bypass surgery on Tuesday, she is not feeling this upon discharge, this pain just started yesterday, she also notes that she is having dark stools, she is never had this past, this started after her surgery.  Patient had recent RY gastric bypass surgery by Dr. Andrey Campanile, her surgical history includes bladder neck reconstruction, hysterectomy, cholecystectomy.  Home Medications Prior to Admission medications   Medication Sig Start Date End Date Taking? Authorizing Provider  acetaminophen (TYLENOL) 500 MG tablet Take 1,000 mg by mouth every 6 (six) hours as needed for moderate pain.    [provider]  albuterol (VENTOLIN HFA) 108 (90 Base) MCG/ACT inhaler TAKE 2 PUFFS BY MOUTH EVERY 6 HOURS AS NEEDED FOR WHEEZE OR SHORTNESS OF BREATH Patient taking differently: Inhale 2 puffs into the lungs every 6 (six) hours as needed for wheezing or shortness of breath. 02/04/21   Charlott Holler, MD  b complex vitamins capsule Take 1 capsule by mouth daily.    [provider]  calcium carbonate (OS-CAL - DOSED IN MG OF ELEMENTAL CALCIUM) 1250 (500 Ca) MG tablet Take 1 tablet by mouth daily with breakfast.    [provider]   cetirizine (ZYRTEC) 10 MG tablet TAKE 1 TABLET (10 MG TOTAL) BY MOUTH DAILY AS NEEDED FOR RHINITIS. Patient taking differently: Take 10 mg by mouth daily as needed for rhinitis or allergies. 06/25/21   Verlee Monte, MD  clonazePAM (KLONOPIN) 1 MG tablet Take 0.5-1 mg by mouth daily as needed (sleep). 01/01/22   [provider]  EPINEPHrine (EPIPEN 2-PAK) 0.3 mg/0.3 mL IJ SOAJ injection Inject 0.3 mg into the muscle as needed for anaphylaxis. 03/21/21   Verlee Monte, MD  esomeprazole (NEXIUM) 40 MG capsule Take 1 capsule (40 mg total) by mouth 2 (two) times daily before a meal. Please schedule a follow up appointment for further refills. Patient taking differently: Take 40 mg by mouth daily. Please schedule a follow up appointment for further refills. 01/31/22   Armbruster, Willaim Rayas, MD  fluticasone furoate-vilanterol (BREO ELLIPTA) 100-25 MCG/INH AEPB Inhale 1 puff into the lungs daily as needed. Patient taking differently: Inhale 1 puff into the lungs daily as needed (shortness of breath). 12/18/20   Glenford Bayley, NP  HYDROcodone-acetaminophen (NORCO) 7.5-325 MG tablet Take 1 tablet by mouth every 8 (eight) hours as needed for moderate pain.    [provider]  Multiple Vitamin (MULTIVITAMIN WITH MINERALS) TABS tablet Take 1 tablet by mouth daily.    [provider]  ondansetron (ZOFRAN-ODT) 4 MG disintegrating tablet Take 1 tablet (4 mg total) by mouth every 6 (six) hours as needed for nausea or vomiting. 03/21/22   Gaynelle Adu, MD  RESTASIS 0.05 %  ophthalmic emulsion Place 1 drop into both eyes 2 (two) times daily as needed (dryness). 11/04/19   [provider]  tiZANidine (ZANAFLEX) 4 MG tablet Take 4 mg by mouth every 6 (six) hours as needed for muscle spasms. 11/16/21   [provider]      Allergies    Aspirin, Ibuprofen, Salmon [fish allergy], and Zoledronic acid    Review of Systems   Review of Systems  Constitutional:  Negative for  chills and fever.  Respiratory:  Negative for shortness of breath.   Cardiovascular:  Negative for chest pain.  Gastrointestinal:  Positive for abdominal pain and diarrhea. Negative for constipation and vomiting.  Neurological:  Negative for headaches.    Physical Exam Updated Vital Signs BP 126/80 (BP Location: Right Arm)   Pulse 83   Temp 98.4 F (36.9 C) (Oral)   Resp 16   LMP 12/27/2003   SpO2 100%  Physical Exam Vitals and nursing note reviewed. Exam conducted with a chaperone present.  Constitutional:      General: She is not in acute distress.    Appearance: She is not ill-appearing.  HENT:     Head: Normocephalic and atraumatic.     Nose: No congestion.  Eyes:     Conjunctiva/sclera: Conjunctivae normal.  Cardiovascular:     Rate and Rhythm: Normal rate and regular rhythm.     Pulses: Normal pulses.     Heart sounds: No murmur heard.    No friction rub. No gallop.  Pulmonary:     Effort: No respiratory distress.     Breath sounds: No wheezing, rhonchi or rales.  Abdominal:     Palpations: Abdomen is soft.     Tenderness: There is abdominal tenderness. There is no right CVA tenderness or left CVA tenderness.     Comments: Patient has surgical scars on her abdomen no evidence of infection present, abdomen nondistended, soft, noted tenderness in her right lower quadrant as well as her right flank, there is no guarding potential peritoneal sign negative Murphy sign McBurney point.  Musculoskeletal:     Right lower leg: No edema.     Left lower leg: No edema.  Skin:    General: Skin is warm and dry.  Neurological:     Mental Status: She is alert.  Psychiatric:        Mood and Affect: Mood normal.     ED Results / Procedures / Treatments   Labs (all labs ordered are listed, but only abnormal results are displayed) Labs Reviewed  CBC WITH DIFFERENTIAL/PLATELET - Abnormal; Notable for the following components:      Result Value   Hemoglobin 11.4 (*)    MCH  25.1 (*)    All other components within normal limits  COMPREHENSIVE METABOLIC PANEL - Abnormal; Notable for the following components:   Total Protein 8.4 (*)    All other components within normal limits  URINALYSIS, ROUTINE W REFLEX MICROSCOPIC - Abnormal; Notable for the following components:   Color, Urine STRAW (*)    All other components within normal limits  POC OCCULT BLOOD, ED - Abnormal; Notable for the following components:   Fecal Occult Bld POSITIVE (*)    All other components within normal limits  LIPASE, BLOOD    EKG None  Radiology CT ABDOMEN PELVIS W CONTRAST  Result Date: 03/24/2022 CLINICAL DATA:  Prior gastric bypass and hernia surgery. Abdominal pain. EXAM: CT ABDOMEN AND PELVIS WITH CONTRAST TECHNIQUE: Multidetector CT imaging of the abdomen  and pelvis was performed using the standard protocol following bolus administration of intravenous contrast. RADIATION DOSE REDUCTION: This exam was performed according to the departmental dose-optimization program which includes automated exposure control, adjustment of the mA and/or kV according to patient size and/or use of iterative reconstruction technique. CONTRAST:  OMNIPAQUE IOHEXOL 300 MG/ML  SOLN COMPARISON:  07/31/2021 FINDINGS: Lower chest: No acute abnormality Hepatobiliary: No focal liver abnormality is seen. Status post cholecystectomy. No biliary dilatation. Pancreas: No focal abnormality or ductal dilatation. Spleen: No focal abnormality.  Normal size. Adrenals/Urinary Tract: No adrenal abnormality. No focal renal abnormality. No stones or hydronephrosis. Urinary bladder is unremarkable. Postoperative changes noted along the anterior bladder wall, stable. Stomach/Bowel: Changes of gastric bypass no complicating feature. No bowel obstruction. Vascular/Lymphatic: No evidence of aneurysm or adenopathy. Reproductive: Prior hysterectomy.  No adnexal masses. Other: No free fluid or free air. Musculoskeletal: No acute  bony abnormality. IMPRESSION: Changes of gastric bypass.  No complicating feature. No acute findings in the abdomen or pelvis. Electronically Signed   By: Charlett Nose M.D.   On: 03/24/2022 01:13    Procedures Procedures    Medications Ordered in ED Medications  iohexol (OMNIPAQUE) 300 MG/ML solution 100 mL (100 mLs Intravenous Contrast Given 03/24/22 0041)  sodium chloride 0.9 % bolus 500 mL (0 mLs Intravenous Stopped 03/24/22 0154)  ondansetron (ZOFRAN) injection 4 mg (4 mg Intravenous Given 03/24/22 0059)  HYDROmorphone (DILAUDID) injection 1 mg (1 mg Intravenous Given 03/24/22 0100)    ED Course/ Medical Decision Making/ A&P                           Medical Decision Making Risk Prescription drug management.   This patient presents to the ED for concern of abdominal pain, this involves an extensive number of treatment options, and is a complaint that carries with it a high risk of complications and morbidity.  The differential diagnosis includes GI bleed, volvulus, intra-abdominal abscess    Additional history obtained:  Additional history obtained from N/A External records from outside source obtained and reviewed including recent surgical notes   Co morbidities that complicate the patient evaluation  Diabetes  Social Determinants of Health:  N/A    Lab Tests:  I Ordered, and personally interpreted labs.  The pertinent results include: CBC shows normocytic anemia hemoglobin 11.4, CMP shows total protein of 8.4, UA is unremarkable, lipase 35, Hemoccult positive   Imaging Studies ordered:  I ordered imaging studies including CT AP I independently visualized and interpreted imaging which showed negative acute findings I agree with the radiologist interpretation   Cardiac Monitoring:  The patient was maintained on a cardiac monitor.  I personally viewed and interpreted the cardiac monitored which showed an underlying rhythm of: N/A   Medicines ordered and  prescription drug management:  I ordered medication including fluids, pain medication, antiemetics I have reviewed the patients home medicines and have made adjustments as needed  Critical Interventions:  N/A   Reevaluation:  Presents with stomach pains, triage obtain lab work and imaging, she is Hemoccult positive, will await CT imaging for further evaluation.  Due to abdominal pain with positive Hemoccult will consult with general surgery for further recommendations.    Consultations Obtained:  I requested consultation with the Dr. Corliss Skains general surgery,  and discussed lab and imaging findings as well as pertinent plan - they recommend: Common for patient to have positive Hemoccults after gastric bypass surgery, states that patient  is stable i.e. hemoglobin is 11.4, she is hemodynamically stable, pain is under control she can be discharged, he will let Dr. Andrey Campanile know that the patient was seen in the ED.    Test Considered:  Admission but will defer, patient does have a positive Hemoccult but I suspect this is likely secondary due to the recent surgery, she is hemodynamically stable, her hemoglobin has increased from 3 days prior, she is not on anticoag's, patient pain is under control, she is also agreement with this plan.    Rule out Suspicion for AAA or dissection is low at this time, she has no bulging mass on my exam, she has low risk factors, presentation is a 2 of etiology.  I have low suspicion for diverticulitis, bowel obstruction, volvulus, perforation, ileus CT imaging is all negative these findings.  T   Dispostion and problem list  After consideration of the diagnostic results and the patients response to treatment, I feel that the patent would benefit from discharge.  Stomach pain-likely secondary to postsurgery, however follow-up with her general surgeon for further evaluation and strict return precautions.            Final Clinical Impression(s) /  ED Diagnoses Final diagnoses:  Right lower quadrant abdominal pain    Rx / DC Orders ED Discharge Orders     None         Carroll Sage, PA-C 03/24/22 0230    Tilden Fossa, MD 03/24/22 [phone]

## 2022-03-24 NOTE — Discharge Instructions (Signed)
Lab work imaging was all reassuring please continue with all home medications.  I like you to follow-up with Dr. Andrey Campanile for further evaluation  If you develop worsening pain, uncontrolled nausea vomiting diarrhea, bloody stools, or symptoms are getting worse please come back in for reassessment

## 2022-03-24 NOTE — ED Notes (Signed)
Pt discharged. Instructions given. AAOX4. Pt in no apparent distress or pain. The opportunity to ask questions was provided.  

## 2022-03-25 ENCOUNTER — Telehealth (HOSPITAL_COMMUNITY): Payer: Self-pay | Admitting: *Deleted

## 2022-03-25 ENCOUNTER — Other Ambulatory Visit: Payer: Self-pay | Admitting: Student

## 2022-03-25 DIAGNOSIS — E860 Dehydration: Secondary | ICD-10-CM

## 2022-03-25 NOTE — Progress Notes (Unsigned)
Orders placed for IV fluids, CBC, and CMP.

## 2022-03-25 NOTE — Telephone Encounter (Signed)
1.  Tell me about your pain and pain management? Pt states that she has been experiencing some intermittent right sided abdominal pain that was worse on Saturday.  Stated that her stomach felt "tight" and was hurting on her right side.  Pt went to ED and was evaluated and discharged.  Discussed fluid goals and for pt to sip throughout the day.  We also discussed that pt can take Tylenol scheduled to assist with pain.  Pt also has an opioid prescription but has not taken any for the pain.  Discussed ways to manage the pain.  Encouraged pt to try options and/or contact [facility] if still concerned.   2.  Let's talk about fluid intake.  How much total fluid are you taking in? Pt states that she is getting in at least 50oz of fluid including protein shakes, bottled water, popsicles and decaf coffee.  Pt was drinking sugar free Body Armour which has 14g of carbs per serving.  We discussed not consuming that drink and sticking to the Phase II list that in discharge folder. We discussed increasing clear liquids and making sure to get in two protein shakes daily to increase PO intake.  Pt instructed to assess status and suggestions daily utilizing Hydration Action Plan on discharge folder and to call [facility] if in the "red zone".  Called [facility] Triage line to request IVF for patient.  3.  How much protein have you taken in the last 2 days? Pt states that she is working to meet goal of goal of 60g of protein today.  Pt has been able to consume one protein shake (30g).  We discussed ways to meet goal such as purchasing protein powder to add to liquids.    4.  Have you had nausea?  Tell me about when have experienced nausea and what you did to help? Pt denies nausea or vomiting.   5.  Has the frequency or color changed with your urine? Pt states that she is urinating "ok, but it's still yellow" with no changes in frequency or urgency.  Discussed drinking more clear liquids.   6.  Tell me what your incisions look  like? "Incisions look fine". They removed her bandaids during her ED visit on Saturday. Pt denies a fever, chills.  Pt states incisions are not swollen, open, or draining.  Pt encouraged to call [facility] if incisions change.   7.  Have you been passing gas? BM? Pt states that she is having BMs. Last BM 03/23/22. Pt states that she is adding Miralax to her water. Pt instructed to take either Miralax or MoM as instructed per "Gastric Bypass/Sleeve Discharge Home Care Instructions".  Pt to call surgeon's office if not able to have BM with medication.   8.  If a problem or question were to arise who would you call?  Do you know contact numbers for [facility], [facility], and [facility]? Pt denies dehydration symptoms today.  Pt can describe s/sx of dehydration.  Pt states that on Saturday, her heart was "racing" and she felt lightheaded prior to going to the ED.  Pt knows to call [facility] for surgical, [facility] for nutrition, and [facility] for non-urgent questions or concerns.   9.  How has the walking going? Pt states she is walking around and able to be active without difficulty.   10. Are you still using your incentive spirometer?  If so, how often? Pt states that she is doing the I.S.   Pt encouraged to use incentive spirometer,  at least 10x every hour while awake until she sees the surgeon.  11.  How are your vitamins and calcium going?  How are you taking them? Pt states that she is taking her supplements and vitamins without difficulty.  Reminded patient that the first 30 days post-operatively are important for successful recovery.  Practice good hand hygiene, wearing a mask when appropriate (since optional in most places), and minimizing exposure to people who live outside of the home, especially if they are exhibiting any respiratory, GI, or illness-like symptoms.

## 2022-03-26 ENCOUNTER — Telehealth: Payer: Self-pay

## 2022-03-26 ENCOUNTER — Ambulatory Visit (INDEPENDENT_AMBULATORY_CARE_PROVIDER_SITE_OTHER): Payer: Commercial Managed Care - HMO

## 2022-03-26 VITALS — BP 102/70 | HR 59 | Temp 98.0°F | Resp 18 | Ht 64.0 in | Wt 234.0 lb

## 2022-03-26 DIAGNOSIS — E860 Dehydration: Secondary | ICD-10-CM

## 2022-03-26 MED ORDER — THIAMINE HCL 100 MG/ML IJ SOLN
Freq: Once | INTRAVENOUS | Status: AC
  Filled 2022-03-26: qty 1000

## 2022-03-26 MED ORDER — SODIUM CHLORIDE 0.9 % IV BOLUS
1000.0000 mL | Freq: Once | INTRAVENOUS | Status: AC
  Administered 2022-03-26: 1000 mL via INTRAVENOUS
  Filled 2022-03-26: qty 1000

## 2022-03-26 NOTE — Progress Notes (Signed)
Diagnosis: Dehydration  Provider:  Chilton Greathouse MD  Procedure: Infusion  IV Type: Peripheral, IV Location: R Hand  Banana Bag, Dose: 1000 ml  Infusion Start Time: 1335  Infusion Stop Time: 1442   Normal Saline, Dose: 1000 ml  Infusion Start Time: 1442  Infusion Stop Time: 1547  Post Infusion IV Care: Peripheral IV Discontinued  Discharge: Condition: Good, Destination: Home . AVS provided to patient.   Performed by:  Adriana Mccallum, RN

## 2022-03-26 NOTE — Telephone Encounter (Signed)
Transition Care Management Follow-up Telephone Call Date of discharge and from where: 03/24/2022 [facility]  How have you been since you were released from the hospital? Pt states she is doing well after receiving IV fluids. Appointment offered, patient declined stating she feels she is just going through motions after procedure on 11/14.   Any questions or concerns? No  Items Reviewed: Did the pt receive and understand the discharge instructions provided? Yes  Medications obtained and verified? Yes  Other? Yes  Any new allergies since your discharge? No  Dietary orders reviewed? Yes Do you have support at home? Yes   Home Care and Equipment/Supplies: Were home health services ordered? no If so, what is the name of the agency? N/a  Has the agency set up a time to come to the patient's home? no Were any new equipment or medical supplies ordered?  No What is the name of the medical supply agency? N/a Were you able to get the supplies/equipment? no Do you have any questions related to the use of the equipment or supplies? No  Functional Questionnaire: (I = Independent and D = Dependent) ADLs: i  Bathing/Dressing- i  Meal Prep- i  Eating- i  Maintaining continence- i  Transferring/Ambulation- i  Managing Meds- i  Follow up appointments reviewed:  PCP Hospital f/u appt confirmed? No  Scheduled to see n/a on n/a @ n/a. Specialist Hospital f/u appt confirmed? No  Scheduled to see n/a on n/a @ n/a. Are transportation arrangements needed? No  If their condition worsens, is the pt aware to call PCP or go to the Emergency Dept.? Yes Was the patient provided with contact information for the PCP's office or ED? Yes Was to pt encouraged to call back with questions or concerns? Yes

## 2022-03-26 NOTE — Telephone Encounter (Signed)
Transition Care Management Unsuccessful Follow-up Telephone Call  Date of discharge and from where:  03/24/2022 [place]   Attempts:  1st Attempt  Reason for unsuccessful TCM follow-up call:  Left voice message

## 2022-04-02 ENCOUNTER — Encounter: Payer: Commercial Managed Care - HMO | Attending: General Surgery | Admitting: Skilled Nursing Facility1

## 2022-04-02 VITALS — Ht 64.0 in | Wt 228.0 lb

## 2022-04-02 DIAGNOSIS — E669 Obesity, unspecified: Secondary | ICD-10-CM | POA: Insufficient documentation

## 2022-04-04 ENCOUNTER — Encounter: Payer: Self-pay | Admitting: Skilled Nursing Facility1

## 2022-04-04 NOTE — Progress Notes (Signed)
2 Week Post-Operative Nutrition Class   Patient was seen on 04/02/22 for Post-Operative Nutrition education at the [facility].     Anthropometrics  Start weight at [facility]: 241.4 lbs (date: 11/22/2021)  Height: 64 in Weight today: 228.0 lbs. BMI: 39.0 kg/m2     Clinical  Medical hx: Allergies, Anemia, Anxiety, arthritis, asthma, GERD, kidney disease, osteoporosis Medications: Esomeprazole, one a day vitamin, vit D, Calcium  Labs: Hemoglobin 10.4; Hematocrit 33.4;  Notable signs/symptoms: nothing noted Any previous deficiencies? No  Surgery date: 03/19/22 Surgery type: RYGB Bowel Habits: Every day to every other day no complaints   Body Composition Scale 04/02/22  Current Body Weight 228.0  Total Body Fat % 43.4  Visceral Fat 14  Fat-Free Mass % 56.5   Total Body Water % 42.7  Muscle-Mass lbs 31.1  BMI 39.0  Body Fat Displacement          Torso  lbs 61.3         Left Leg  lbs 12.2         Right Leg  lbs 12.2         Left Arm  lbs 6.1         Right Arm   lbs 6.1      The following the learning objectives were met by the patient during this course: Identifies Phase 3 (Soft, High Proteins) Dietary Goals and will begin from 2 weeks post-operatively to 2 months post-operatively Identifies appropriate sources of fluids and proteins  Identifies appropriate fat sources and healthy verses unhealthy fat types   States protein recommendations and appropriate sources post-operatively Identifies the need for appropriate texture modifications, mastication, and bite sizes when consuming solids Identifies appropriate fat consumption and sources Identifies appropriate multivitamin and calcium sources post-operatively Describes the need for physical activity post-operatively and will follow MD recommendations States when to call healthcare provider regarding medication questions or post-operative complications   Handouts given during class include: Phase  3A: Soft, High Protein Diet Handout Phase 3 High Protein Meals Healthy Fats   Follow-Up Plan: Patient will follow-up at [facility] in 6 weeks for 2 month post-op nutrition visit for diet advancement per MD.  

## 2022-04-05 ENCOUNTER — Ambulatory Visit: Payer: Commercial Managed Care - HMO | Admitting: Hematology

## 2022-04-05 ENCOUNTER — Other Ambulatory Visit: Payer: Commercial Managed Care - HMO

## 2022-04-08 ENCOUNTER — Telehealth: Payer: Self-pay | Admitting: Dietician

## 2022-04-08 NOTE — Telephone Encounter (Signed)
RD called pt to verify fluid intake once starting soft, solid proteins 2 week post-bariatric surgery.   Daily Fluid intake: 64 oz Daily Protein intake: 60 oz Bowel Habits: no concerns  Concerns/issues:  no concerns

## 2022-04-16 ENCOUNTER — Encounter (HOSPITAL_COMMUNITY): Payer: Self-pay

## 2022-04-16 ENCOUNTER — Ambulatory Visit (HOSPITAL_COMMUNITY)
Admission: EM | Admit: 2022-04-16 | Discharge: 2022-04-16 | Disposition: A | Discharge status: Home / Self Care | Payer: Commercial Managed Care - HMO | Attending: Emergency Medicine | Admitting: Emergency Medicine

## 2022-04-16 DIAGNOSIS — Z79899 Other long term (current) drug therapy: Secondary | ICD-10-CM | POA: Insufficient documentation

## 2022-04-16 DIAGNOSIS — J069 Acute upper respiratory infection, unspecified: Secondary | ICD-10-CM | POA: Diagnosis not present

## 2022-04-16 DIAGNOSIS — U071 COVID-19: Secondary | ICD-10-CM | POA: Insufficient documentation

## 2022-04-16 DIAGNOSIS — R059 Cough, unspecified: Secondary | ICD-10-CM | POA: Diagnosis not present

## 2022-04-16 LAB — RESP PANEL BY RT-PCR (FLU A&B, COVID) ARPGX2
Influenza A by PCR: NEGATIVE
Influenza B by PCR: NEGATIVE
SARS Coronavirus 2 by RT PCR: POSITIVE — AB

## 2022-04-16 MED ORDER — GUAIFENESIN ER 600 MG PO TB12: 600.0000 mg | ORAL_TABLET | Freq: Two times a day (BID) | ORAL | 0 refills | Status: AC

## 2022-04-16 MED ORDER — ACETAMINOPHEN 325 MG PO TABS: 650.0000 mg | ORAL_TABLET | Freq: Four times a day (QID) | ORAL | 0 refills | Status: AC | PRN

## 2022-04-16 MED ORDER — ONDANSETRON HCL 4 MG PO TABS: 4.0000 mg | ORAL_TABLET | Freq: Four times a day (QID) | ORAL | 0 refills | Status: DC

## 2022-04-16 NOTE — Discharge Instructions (Addendum)
Continue tylenol as needed for aches/fever Use mucinex twice daily for cough and congestion. Increase fluid intake Zofran can be taken every 6 hours as needed for nausea  We will call you if your covid/flu test returns positive.

## 2022-04-16 NOTE — ED Provider Notes (Signed)
[facility]    CSN: [id number] Arrival date & time: 04/16/22  1603     History   Chief Complaint Chief Complaint  Patient presents with   Cough    HPI [name] is a 46 y.o. female.  Presents with 4 day history of multiple symptoms Runny nose and congestion, cough, body aches and chills No fevers Reports nausea on and off the last 2 days Tolerating p.o fluids  Has tried tylenol and dayquil without change in symptoms  Son is sick with similar   Recent gastric bypass surgery No abdominal pain, diarrhea, vomiting, blood in stool  Past Medical History:  Diagnosis Date   Allergy    Anemia    Anxiety    Apnea 12/08/2019   Arthritis    Asthma    Back pain    Constipation    Exertional dyspnea 03/14/2020   Fatty liver    GERD (gastroesophageal reflux disease)    H/O: hysterectomy    Hx of endometriosis    Inappropriate sinus tachycardia 12/08/2019   Insomnia    Joint pain    Lactose intolerance    Migraine    Morbid obesity ([place]) 12/08/2019   Osteoporosis    Urticaria    Vitamin D deficiency     Patient Active Problem List   Diagnosis Date Noted   S/P gastric bypass 03/19/2022   Microcytic anemia 12/14/2021   Iron deficiency anemia 12/14/2021   Adverse food reaction 03/21/2021   Ascites 10/05/2020   Other insomnia 07/27/2020   At risk for side effect of medication 07/27/2020   Other hyperlipidemia 05/15/2020   At risk for osteoporosis 05/15/2020   At risk for impaired metabolic function [id number]   Other fatigue 03/22/2020   SOBOE (shortness of breath on exertion) 03/22/2020   Exertional dyspnea 03/14/2020   Neuropathy, right side of face 03/08/2020   At risk for depression 03/08/2020   Primary osteoarthritis of both feet 02/26/2020   Hyperlipidemia 01/26/2020   Prediabetes 01/26/2020   At risk for heart disease 01/26/2020   Daytime somnolence 01/26/2020   Vitamin D deficiency 01/04/2020   Fatty liver 01/04/2020   B12  deficiency 01/04/2020   Moderate persistent asthma without complication 0000000   Inappropriate sinus tachycardia 12/08/2019   Class 3 severe obesity with serious comorbidity and body mass index (BMI) of 40.0 to 44.9 in adult ([place]) 12/08/2019   Apnea 12/08/2019   Anaphylactic reaction due to food, subsequent encounter 11/22/2019   Idiopathic urticaria 11/22/2019   Generalized anxiety disorder 04/08/2018   Atypical chest pain    Regurgitation of food    Laryngopharyngeal reflux (LPR) 07/30/2017   Throat soreness 07/30/2017   ANA positive 12/30/2016   Gastroesophageal reflux disease 06/27/2016   Disturbance of skin sensation 04/27/2014   Dizziness and giddiness 03/14/2014   Headache, migraine 03/14/2014   Myalgia and myositis, unspecified 06/07/2013   Nausea and vomiting in adult 08/22/2012   Viral syndrome 08/22/2012   Pelvic pain in female 12/18/2010   ANEMIA-IRON DEFICIENCY 10/15/2006   HERPES, GENITAL NOS 09/30/2006   Obesity (BMI 35.0-39.9 without comorbidity) 09/30/2006   DISORDER, NONORGANIC SLEEP NOS 09/30/2006   CONSTIPATION NOS 09/30/2006   NEURITIS, LUMBOSACRAL NOS 09/30/2006   HX, PERSONAL, MENTAL DISORDER NOS 09/30/2006    Past Surgical History:  Procedure Laterality Date   33 HOUR [place] STUDY N/A 01/12/2018   Procedure: 24 HOUR PH STUDY;  Surgeon: Mauri Pole, MD;  Location: [facility];  Service: Endoscopy;  Laterality: N/A;  ABDOMINAL HYSTERECTOMY  12/12/2003   ADENOIDECTOMY  05/2011   ANKLE ARTHROSCOPY  12/29/2007   right; with extensive debridement   BLADDER NECK RECONSTRUCTION  01/14/2011   Procedure: BLADDER NECK REPAIR;  Surgeon: Reva Bores, MD;  Location: [facility];  Service: Gynecology;  Laterality: N/A;  Laparoscopic Repair of Incidental Cystotomy   BREAST REDUCTION SURGERY  08/05/2011   Procedure: MAMMARY REDUCTION  (BREAST);  Surgeon: Louisa Second, MD;  Location: [facility];  Service: Plastics;  Laterality: Bilateral;   bilateral   CESAREAN SECTION  12/29/2000; 1994   CHOLECYSTECTOMY     age 86   DILATION AND CURETTAGE OF UTERUS  07/08/2003   open laparoscopy   ESOPHAGEAL MANOMETRY N/A 01/12/2018   Procedure: ESOPHAGEAL MANOMETRY (EM);  Surgeon: Napoleon Form, MD;  Location: [facility];  Service: Endoscopy;  Laterality: N/A;   GASTRIC ROUX-EN-Y N/A 03/19/2022   Procedure: LAPAROSCOPIC ROUX-EN-Y GASTRIC BYPASS WITH UPPER ENDOSCOPY;  Surgeon: Gaynelle Adu, MD;  Location: [facility];  Service: General;  Laterality: N/A;   GIVENS CAPSULE STUDY N/A 06/23/2017   Procedure: GIVENS CAPSULE STUDY;  Surgeon: Benancio Deeds, MD;  Location[facility];  Service: Gastroenterology;  Laterality: N/A;   HIATAL HERNIA REPAIR N/A 03/19/2022   Procedure: HERNIA REPAIR HIATAL;  Surgeon: Gaynelle Adu, MD;  Location: [facility];  Service: General;  Laterality: N/A;   LAPAROSCOPIC SALPINGOOPHERECTOMY  01/14/2011   left   PH IMPEDANCE STUDY N/A 01/12/2018   Procedure: PH IMPEDANCE STUDY;  Surgeon: Napoleon Form, MD;  Location: [facility];  Service: Endoscopy;  Laterality: N/A;   RESECTION DISTAL CLAVICAL Right 09/25/2017   Procedure: RESECTION DISTAL CLAVICAL;  Surgeon: Bjorn Pippin, MD;  Location: [facility];  Service: Orthopedics;  Laterality: Right;   RIGHT OOPHORECTOMY  2011   with lysis of adhesions   SHOULDER ACROMIOPLASTY Right 09/25/2017   Procedure: SHOULDER ACROMIOPLASTY;  Surgeon: Bjorn Pippin, MD;  Location: [facility];  Service: Orthopedics;  Laterality: Right;   SHOULDER ARTHROSCOPY WITH DEBRIDEMENT AND BICEP TENDON REPAIR Right 09/25/2017   Procedure: SHOULDER ARTHROSCOPY WITH DEBRIDEMENT AND BICEP TENDON REPAIR;  Surgeon: Bjorn Pippin, MD;  Location: [facility];  Service: Orthopedics;  Laterality: Right;   SHOULDER ARTHROSCOPY WITH ROTATOR CUFF REPAIR Right 09/25/2017   Procedure: SHOULDER ARTHROSCOPY WITH ROTATOR CUFF REPAIR;  Surgeon: Bjorn Pippin,  MD;  Location: [facility];  Service: Orthopedics;  Laterality: Right;   TONSILLECTOMY  as a child   TUBAL LIGATION  12/29/2000   UPPER GASTROINTESTINAL ENDOSCOPY      OB History     Gravida  2   Para  2   Term  2   Preterm      AB      Living  2      SAB      IAB      Ectopic      Multiple      Live Births               Home Medications    Prior to Admission medications   Medication Sig Start Date End Date Taking? Authorizing Provider  acetaminophen (TYLENOL) 325 MG tablet Take 2 tablets (650 mg total) by mouth every 6 (six) hours as needed. 04/16/22  Yes Dayron Odland, Lurena Joiner, PA-C  guaiFENesin (MUCINEX) 600 MG 12 hr tablet Take 1 tablet (600 mg total) by mouth 2 (two) times daily for 5 days. 04/16/22 04/21/22 Yes  Rex Magee, PA-C  ondansetron (ZOFRAN) 4 MG tablet Take 1 tablet (4 mg total) by mouth every 6 (six) hours. 04/16/22  Yes Sharise Lippy, Wells Guiles, PA-C  albuterol (VENTOLIN HFA) 108 (90 Base) MCG/ACT inhaler TAKE 2 PUFFS BY MOUTH EVERY 6 HOURS AS NEEDED FOR WHEEZE OR SHORTNESS OF BREATH Patient taking differently: Inhale 2 puffs into the lungs every 6 (six) hours as needed for wheezing or shortness of breath. 02/04/21   Spero Geralds, MD  b complex vitamins capsule Take 1 capsule by mouth daily.    [provider]  calcium carbonate (OS-CAL - DOSED IN MG OF ELEMENTAL CALCIUM) 1250 (500 Ca) MG tablet Take 1 tablet by mouth daily with breakfast.    [provider]  cetirizine (ZYRTEC) 10 MG tablet TAKE 1 TABLET (10 MG TOTAL) BY MOUTH DAILY AS NEEDED FOR RHINITIS. Patient taking differently: Take 10 mg by mouth daily as needed for rhinitis or allergies. 06/25/21   Clemon Chambers, MD  clonazePAM (KLONOPIN) 1 MG tablet Take 0.5-1 mg by mouth daily as needed (sleep). 01/01/22   [provider]  EPINEPHrine (EPIPEN 2-PAK) 0.3 mg/0.3 mL IJ SOAJ injection Inject 0.3 mg into the muscle as needed for anaphylaxis. 03/21/21   Clemon Chambers, MD  esomeprazole (NEXIUM) 40 MG capsule Take 1 capsule (40 mg total) by mouth 2 (two) times daily before a meal. Please schedule a follow up appointment for further refills. 01/31/22   Armbruster, Carlota Raspberry, MD  fluticasone furoate-vilanterol (BREO ELLIPTA) 100-25 MCG/INH AEPB Inhale 1 puff into the lungs daily as needed. Patient taking differently: Inhale 1 puff into the lungs daily as needed (shortness of breath). 12/18/20   Martyn Ehrich, NP  Multiple Vitamin (MULTIVITAMIN WITH MINERALS) TABS tablet Take 1 tablet by mouth daily.    [provider]  RESTASIS 0.05 % ophthalmic emulsion Place 1 drop into both eyes 2 (two) times daily as needed (dryness). 11/04/19   [provider]  tiZANidine (ZANAFLEX) 4 MG tablet Take 4 mg by mouth every 6 (six) hours as needed for muscle spasms. 11/16/21   [provider]    Family History Family History  Problem Relation Age of Onset   Diabetes Mother    Hypertension Mother    Hyperlipidemia Mother    Sleep apnea Mother    Thyroid disease Sister    Dementia Father    Heart attack Maternal Grandmother    Stroke Maternal Grandfather    Asthma Son    Healthy Son    Asthma Daughter    Healthy Daughter    Colon cancer Neg Hx    Colon polyps Neg Hx    Esophageal cancer Neg Hx    Rectal cancer Neg Hx    Stomach cancer Neg Hx     Social History Social History   Tobacco Use   Smoking status: Never   Smokeless tobacco: Never  Vaping Use   Vaping Use: Never used  Substance Use Topics   Alcohol use: Yes    Comment: occasional    Drug use: No     Allergies   Aspirin, Ibuprofen, [place] [fish allergy], and Zoledronic acid   Review of Systems Review of Systems Per HPI  Physical Exam Triage Vital Signs ED Triage Vitals  Enc Vitals Group     BP      Pulse      Resp      Temp      Temp src  SpO2      Weight      Height      Head Circumference      Peak Flow      Pain Score      Pain Loc       Pain Edu?      Excl. in [place]?    No data found.  Updated Vital Signs BP 117/86 (BP Location: Left Arm)   Pulse 75   Temp 98.6 F (37 C) (Oral)   Resp 18   LMP 12/27/2003   SpO2 98%   Physical Exam Vitals and nursing note reviewed.  Constitutional:      General: She is not in acute distress. HENT:     Nose: Congestion present.     Mouth/Throat:     Mouth: Mucous membranes are moist.     Pharynx: Uvula midline. No posterior oropharyngeal erythema.     Tonsils: No tonsillar exudate or tonsillar abscesses.  Eyes:     Conjunctiva/sclera: Conjunctivae normal.  Cardiovascular:     Rate and Rhythm: Normal rate and regular rhythm.     Heart sounds: Normal heart sounds.  Pulmonary:     Effort: Pulmonary effort is normal.     Breath sounds: Normal breath sounds.  Abdominal:     Palpations: Abdomen is soft.     Tenderness: There is no abdominal tenderness. There is no guarding.  Musculoskeletal:     Cervical back: Normal range of motion.  Lymphadenopathy:     Cervical: Cervical adenopathy (non tender) present.  Skin:    General: Skin is warm and dry.  Neurological:     Mental Status: She is alert and oriented to person, place, and time.     UC Treatments / Results  Labs (all labs ordered are listed, but only abnormal results are displayed) Labs Reviewed  RESP PANEL BY RT-PCR (FLU A&B, COVID) [id number]    EKG   Radiology No results found.  Procedures Procedures (including critical care time)  Medications Ordered in UC Medications - No data to display  Initial Impression / Assessment and Plan / UC Course  I have reviewed the triage vital signs and the nursing notes.  Pertinent labs & imaging results that were available during my care of the patient were reviewed by me and considered in my medical decision making (see chart for details).  Viral illness Would like covid and flu test. Discussed out of window for flu antivirals, but consider Paxlovid if covid positive.  GFR >60 last month. Patient will decide based on results. Other symptomatic care including mucinex for cough/congestion, sent zofran to use prn for nausea, increasing fluids Return precautions discussed. Patient agrees to plan  Final Clinical Impressions(s) / UC Diagnoses   Final diagnoses:  Viral URI with cough     Discharge Instructions      Continue tylenol as needed for aches/fever Use mucinex twice daily for cough and congestion. Increase fluid intake Zofran can be taken every 6 hours as needed for nausea  We will call you if your covid/flu test returns positive.       ED Prescriptions     Medication Sig Dispense Auth. Provider   ondansetron (ZOFRAN) 4 MG tablet Take 1 tablet (4 mg total) by mouth every 6 (six) hours. 12 tablet Marylin Lathon, PA-C   guaiFENesin (MUCINEX) 600 MG 12 hr tablet Take 1 tablet (600 mg total) by mouth 2 (two) times daily for 5 days. 10 tablet Shahzaib Azevedo, PA-C   acetaminophen (  TYLENOL) 325 MG tablet Take 2 tablets (650 mg total) by mouth every 6 (six) hours as needed. 30 tablet Placida Cambre, Wells Guiles, PA-C      PDMP not reviewed this encounter.   Mikki Ziff, Vernice Jefferson 04/16/22 2023

## 2022-04-16 NOTE — ED Triage Notes (Signed)
Pt c/o headache/cough/runny nose/chills x4 days. States taking OTC meds with little relief.  

## 2022-04-17 ENCOUNTER — Telehealth (HOSPITAL_COMMUNITY): Payer: Self-pay | Admitting: Emergency Medicine

## 2022-04-17 MED ORDER — NIRMATRELVIR/RITONAVIR (PAXLOVID)TABLET: 3.0000 | ORAL_TABLET | Freq: Two times a day (BID) | ORAL | 0 refills | Status: AC

## 2022-04-30 ENCOUNTER — Encounter: Payer: 59 | Admitting: Nurse Practitioner

## 2022-05-07 ENCOUNTER — Encounter: Payer: Self-pay | Admitting: Hematology

## 2022-05-07 ENCOUNTER — Ambulatory Visit (INDEPENDENT_AMBULATORY_CARE_PROVIDER_SITE_OTHER): Payer: Medicaid Other | Admitting: Nurse Practitioner

## 2022-05-07 ENCOUNTER — Encounter: Payer: Self-pay | Admitting: Nurse Practitioner

## 2022-05-07 VITALS — BP 108/74 | HR 63 | Temp 98.1°F | Ht 64.0 in | Wt 219.4 lb

## 2022-05-07 DIAGNOSIS — Z23 Encounter for immunization: Secondary | ICD-10-CM

## 2022-05-07 DIAGNOSIS — Z0000 Encounter for general adult medical examination without abnormal findings: Secondary | ICD-10-CM | POA: Diagnosis not present

## 2022-05-07 DIAGNOSIS — E6609 Other obesity due to excess calories: Secondary | ICD-10-CM | POA: Diagnosis not present

## 2022-05-07 DIAGNOSIS — R5383 Other fatigue: Secondary | ICD-10-CM

## 2022-05-07 DIAGNOSIS — D509 Iron deficiency anemia, unspecified: Secondary | ICD-10-CM | POA: Diagnosis not present

## 2022-05-07 DIAGNOSIS — E559 Vitamin D deficiency, unspecified: Secondary | ICD-10-CM

## 2022-05-07 DIAGNOSIS — E782 Mixed hyperlipidemia: Secondary | ICD-10-CM | POA: Diagnosis not present

## 2022-05-07 DIAGNOSIS — Z6837 Body mass index (BMI) 37.0-37.9, adult: Secondary | ICD-10-CM

## 2022-05-07 DIAGNOSIS — R7309 Other abnormal glucose: Secondary | ICD-10-CM

## 2022-05-07 DIAGNOSIS — E66812 Obesity, class 2: Secondary | ICD-10-CM

## 2022-05-07 LAB — POC COVID19 BINAXNOW: SARS Coronavirus 2 Ag: NEGATIVE

## 2022-05-07 NOTE — Patient Instructions (Addendum)
Health Maintenance, Female Adopting a healthy lifestyle and getting preventive care are important in promoting health and wellness. Ask your health care provider about: The right schedule for you to have regular tests and exams. Things you can do on your own to prevent diseases and keep yourself healthy. What should I know about diet, weight, and exercise? Eat a healthy diet  Eat a diet that includes plenty of vegetables, fruits, low-fat dairy products, and lean protein. Do not eat a lot of foods that are high in solid fats, added sugars, or sodium. Maintain a healthy weight Body mass index (BMI) is used to identify weight problems. It estimates body fat based on height and weight. Your health care provider can help determine your BMI and help you achieve or maintain a healthy weight. Get regular exercise Get regular exercise. This is one of the most important things you can do for your health. Most adults should: Exercise for at least 150 minutes each week. The exercise should increase your heart rate and make you sweat (moderate-intensity exercise). Do strengthening exercises at least twice a week. This is in addition to the moderate-intensity exercise. Spend less time sitting. Even light physical activity can be beneficial. Watch cholesterol and blood lipids Have your blood tested for lipids and cholesterol at 47 years of age, then have this test every 5 years. Have your cholesterol levels checked more often if: Your lipid or cholesterol levels are high. You are older than 47 years of age. You are at high risk for heart disease. What should I know about cancer screening? Depending on your health history and family history, you may need to have cancer screening at various ages. This may include screening for: Breast cancer. Cervical cancer. Colorectal cancer. Skin cancer. Lung cancer. What should I know about heart disease, diabetes, and high blood pressure? Blood pressure and heart  disease High blood pressure causes heart disease and increases the risk of stroke. This is more likely to develop in people who have high blood pressure readings or are overweight. Have your blood pressure checked: Every 3-5 years if you are 18-39 years of age. Every year if you are 40 years old or older. Diabetes Have regular diabetes screenings. This checks your fasting blood sugar level. Have the screening done: Once every three years after age 40 if you are at a normal weight and have a low risk for diabetes. More often and at a younger age if you are overweight or have a high risk for diabetes. What should I know about preventing infection? Hepatitis B If you have a higher risk for hepatitis B, you should be screened for this virus. Talk with your health care provider to find out if you are at risk for hepatitis B infection. Hepatitis C Testing is recommended for: Everyone born from 1945 through 1965. Anyone with known risk factors for hepatitis C. Sexually transmitted infections (STIs) Get screened for STIs, including gonorrhea and chlamydia, if: You are sexually active and are younger than 47 years of age. You are older than 47 years of age and your health care provider tells you that you are at risk for this type of infection. Your sexual activity has changed since you were last screened, and you are at increased risk for chlamydia or gonorrhea. Ask your health care provider if you are at risk. Ask your health care provider about whether you are at high risk for HIV. Your health care provider may recommend a prescription medicine to help prevent HIV   infection. If you choose to take medicine to prevent HIV, you should first get tested for HIV. You should then be tested every 3 months for as long as you are taking the medicine. Pregnancy If you are about to stop having your period (premenopausal) and you may become pregnant, seek counseling before you get pregnant. Take 400 to 800  micrograms (mcg) of folic acid every day if you become pregnant. Ask for birth control (contraception) if you want to prevent pregnancy. Osteoporosis and menopause Osteoporosis is a disease in which the bones lose minerals and strength with aging. This can result in bone fractures. If you are 65 years old or older, or if you are at risk for osteoporosis and fractures, ask your health care provider if you should: Be screened for bone loss. Take a calcium or vitamin D supplement to lower your risk of fractures. Be given hormone replacement therapy (HRT) to treat symptoms of menopause. Follow these instructions at home: Alcohol use Do not drink alcohol if: Your health care provider tells you not to drink. You are pregnant, may be pregnant, or are planning to become pregnant. If you drink alcohol: Limit how much you have to: 0-1 drink a day. Know how much alcohol is in your drink. In the [place] one drink equals one 12 oz bottle of beer (355 mL), one 5 oz glass of wine (148 mL), or one 1 oz glass of hard liquor (44 mL). Lifestyle Do not use any products that contain nicotine or tobacco. These products include cigarettes, chewing tobacco, and vaping devices, such as e-cigarettes. If you need help quitting, ask your health care provider. Do not use street drugs. Do not share needles. Ask your health care provider for help if you need support or information about quitting drugs. General instructions Schedule regular health, dental, and eye exams. Stay current with your vaccines. Tell your health care provider if: You often feel depressed. You have ever been abused or do not feel safe at home. Summary Adopting a healthy lifestyle and getting preventive care are important in promoting health and wellness. Follow your health care provider's instructions about healthy diet, exercising, and getting tested or screened for diseases. Follow your health care provider's instructions on monitoring your  cholesterol and blood pressure. This information is not intended to replace advice given to you by your health care provider. Make sure you discuss any questions you have with your health care provider. Document Revised: 09/11/2020 Document Reviewed: 09/11/2020 Elsevier Patient Education  2023 Elsevier Inc.   Influenza (Flu) Vaccine (Inactivated or Recombinant): What You Need to Know 1. Why get vaccinated? Influenza vaccine can prevent influenza (flu). Flu is a contagious disease that spreads around the [place] every year, usually between October and May. Anyone can get the flu, but it is more dangerous for some people. Infants and young children, people 65 years and older, pregnant people, and people with certain health conditions or a weakened immune system are at greatest risk of flu complications. Pneumonia, bronchitis, sinus infections, and ear infections are examples of flu-related complications. If you have a medical condition, such as heart disease, cancer, or diabetes, flu can make it worse. Flu can cause fever and chills, sore throat, muscle aches, fatigue, cough, headache, and runny or stuffy nose. Some people may have vomiting and diarrhea, though this is more common in children than adults. In an average year, thousands of people in the [place] die from flu, and many more are hospitalized. Flu vaccine prevents   millions of illnesses and flu-related visits to the doctor each year. 2. Influenza vaccines CDC recommends everyone 6 months and older get vaccinated every flu season. Children 6 months through 8 years of age may need 2 doses during a single flu season. Everyone else needs only 1 dose each flu season. It takes about 2 weeks for protection to develop after vaccination. There are many flu viruses, and they are always changing. Each year a new flu vaccine is made to protect against the influenza viruses believed to be likely to cause disease in the upcoming flu season.  Even when the vaccine doesn't exactly match these viruses, it may still provide some protection. Influenza vaccine does not cause flu. Influenza vaccine may be given at the same time as other vaccines. 3. Talk with your health care provider Tell your vaccination provider if the person getting the vaccine: Has had an allergic reaction after a previous dose of influenza vaccine, or has any severe, life-threatening allergies Has ever had Guillain-Barr Syndrome (also called "GBS") In some cases, your health care provider may decide to postpone influenza vaccination until a future visit. Influenza vaccine can be administered at any time during pregnancy. People who are or will be pregnant during influenza season should receive inactivated influenza vaccine. People with minor illnesses, such as a cold, may be vaccinated. People who are moderately or severely ill should usually wait until they recover before getting influenza vaccine. Your health care provider can give you more information. 4. Risks of a vaccine reaction Soreness, redness, and swelling where the shot is given, fever, muscle aches, and headache can happen after influenza vaccination. There may be a very small increased risk of Guillain-Barr Syndrome (GBS) after inactivated influenza vaccine (the flu shot). Young children who get the flu shot along with pneumococcal vaccine (PCV13) and/or DTaP vaccine at the same time might be slightly more likely to have a seizure caused by fever. Tell your health care provider if a child who is getting flu vaccine has ever had a seizure. People sometimes faint after medical procedures, including vaccination. Tell your provider if you feel dizzy or have vision changes or ringing in the ears. As with any medicine, there is a very remote chance of a vaccine causing a severe allergic reaction, other serious injury, or death. 5. What if there is a serious problem? An allergic reaction could occur after the  vaccinated person leaves the clinic. If you see signs of a severe allergic reaction (hives, swelling of the face and throat, difficulty breathing, a fast heartbeat, dizziness, or weakness), call 9-1-1 and get the person to the nearest hospital. For other signs that concern you, call your health care provider. Adverse reactions should be reported to the Vaccine Adverse Event Reporting System (VAERS). Your health care provider will usually file this report, or you can do it yourself. Visit the VAERS website at www.vaers.hhs.gov or call 1-800-822-7967. VAERS is only for reporting reactions, and VAERS staff members do not give medical advice. 6. The National Vaccine Injury Compensation Program The National Vaccine Injury Compensation Program (VICP) is a federal program that was created to compensate people who may have been injured by certain vaccines. Claims regarding alleged injury or death due to vaccination have a time limit for filing, which may be as short as two years. Visit the VICP website at www.hrsa.gov/vaccinecompensation or call 1-800-338-2382 to learn about the program and about filing a claim. 7. How can I learn more? Ask your health care provider. Call your   local or state health department. Visit the website of the Food and Drug Administration (FDA) for vaccine package inserts and additional information at www.fda.gov/vaccines-blood-biologics/vaccines. Contact the Centers for Disease Control and Prevention (CDC): Call 1-800-232-4636 (1-800-CDC-INFO) or Visit CDC's website at www.cdc.gov/flu. Source: CDC Vaccine Information Statement Inactivated Influenza Vaccine (12/10/2019) This same material is available at www.cdc.gov for no charge. This information is not intended to replace advice given to you by your health care provider. Make sure you discuss any questions you have with your health care provider. Document Revised: 03/20/2021 Document Reviewed: 01/11/2021 Elsevier Patient Education   2023 Elsevier Inc.  

## 2022-05-07 NOTE — Progress Notes (Signed)
I,Victoria T Hamilton,acting as a Neurosurgeon for [name], FNP.,have documented all relevant documentation on the behalf of [name], FNP,as directed by  [name], FNP while in the presence of [name], FNP.   Subjective:     Patient ID: [name] , female    DOB: Jan 01, 1976 , 47 y.o.   MRN: [id number]   Chief Complaint  Patient presents with   Annual Exam    HPI  Here for HM.  she had gastric bypass on March 19, 2022 with [facility], since that time has been doing okay. She has an appt with the Bariatric clinic this Friday. She is walking 3 times a week at least 40 minutes. She reports her family members have strep and covid. She has been having a runny nose and body aches. Her hematologist is going to check her iron level in March she had an iron transfusion prior to her procedure.   Wt Readings from Last 3 Encounters: 05/07/22 : 219 lb 6.4 oz (99.5 kg) 04/04/22 : 228 lb (103.4 kg) 03/26/22 : 234 lb (106.1 kg)       Past Medical History:  Diagnosis Date   Allergy    Anemia    Anxiety    Apnea 12/08/2019   Arthritis    Asthma    Back pain    Constipation    Exertional dyspnea 03/14/2020   Fatty liver    GERD (gastroesophageal reflux disease)    H/O: hysterectomy    Hx of endometriosis    Inappropriate sinus tachycardia 12/08/2019   Insomnia    Joint pain    Lactose intolerance    Migraine    Morbid obesity (HCC) 12/08/2019   Osteoporosis    Urticaria    Vitamin D deficiency      Family History  Problem Relation Age of Onset   Diabetes Mother    Hypertension Mother    Hyperlipidemia Mother    Sleep apnea Mother    Thyroid disease Sister    Dementia Father    Heart attack Maternal Grandmother    Stroke Maternal Grandfather    Asthma Son    Healthy Son    Asthma Daughter    Healthy Daughter    Colon cancer Neg Hx    Colon polyps Neg Hx    Esophageal cancer Neg Hx    Rectal cancer Neg Hx    Stomach cancer Neg Hx       Current Outpatient Medications:    acetaminophen (TYLENOL) 325 MG tablet, Take 2 tablets (650 mg total) by mouth every 6 (six) hours as needed., Disp: 30 tablet, Rfl: 0   albuterol (VENTOLIN HFA) 108 (90 Base) MCG/ACT inhaler, TAKE 2 PUFFS BY MOUTH EVERY 6 HOURS AS NEEDED FOR WHEEZE OR SHORTNESS OF BREATH (Patient taking differently: Inhale 2 puffs into the lungs every 6 (six) hours as needed for wheezing or shortness of breath.), Disp: 6.7 each, Rfl: 11   b complex vitamins capsule, Take 1 capsule by mouth daily., Disp: , Rfl:    calcium carbonate (OS-CAL - DOSED IN MG OF ELEMENTAL CALCIUM) 1250 (500 Ca) MG tablet, Take 1 tablet by mouth daily with breakfast., Disp: , Rfl:    cetirizine (ZYRTEC) 10 MG tablet, TAKE 1 TABLET (10 MG TOTAL) BY MOUTH DAILY AS NEEDED FOR RHINITIS. (Patient taking differently: Take 10 mg by mouth daily as needed for rhinitis or allergies.), Disp: 90 tablet, Rfl: 1   clonazePAM (KLONOPIN) 1 MG tablet, Take 0.5-1 mg  by mouth daily as needed (sleep)., Disp: , Rfl:    EPINEPHrine (EPIPEN 2-PAK) 0.3 mg/0.3 mL IJ SOAJ injection, Inject 0.3 mg into the muscle as needed for anaphylaxis., Disp: 2 each, Rfl: 2   esomeprazole (NEXIUM) 40 MG capsule, Take 1 capsule (40 mg total) by mouth 2 (two) times daily before a meal. Please schedule a follow up appointment for further refills., Disp: 60 capsule, Rfl: 1   fluticasone furoate-vilanterol (BREO ELLIPTA) 100-25 MCG/INH AEPB, Inhale 1 puff into the lungs daily as needed. (Patient taking differently: Inhale 1 puff into the lungs daily as needed (shortness of breath).), Disp: 60 each, Rfl: 11   Multiple Vitamin (MULTIVITAMIN WITH MINERALS) TABS tablet, Take 1 tablet by mouth daily., Disp: , Rfl:    ondansetron (ZOFRAN) 4 MG tablet, Take 1 tablet (4 mg total) by mouth every 6 (six) hours., Disp: 12 tablet, Rfl: 0   RESTASIS 0.05 % ophthalmic emulsion, Place 1 drop into both eyes 2 (two) times daily as needed (dryness)., Disp: , Rfl:     tiZANidine (ZANAFLEX) 4 MG tablet, Take 4 mg by mouth every 6 (six) hours as needed for muscle spasms., Disp: , Rfl:    Allergies  Allergen Reactions   Aspirin Other (See Comments)    MD advised pt to not take med   Ibuprofen Other (See Comments)    MD advised pt to not take med    Rosanne Ashing Allergy] Swelling   Zoledronic Acid Other (See Comments)    Muscle aches      The patient states she uses status post hysterectomy for birth control.  Patient's last menstrual period was 12/27/2003.. Negative for Dysmenorrhea and Negative for Menorrhagia. Negative for: breast discharge, breast lump(s), breast pain and breast self exam. Associated symptoms include abnormal vaginal bleeding. Pertinent negatives include abnormal bleeding (hematology), anxiety, decreased libido, depression, difficulty falling sleep, dyspareunia, history of infertility, nocturia, sexual dysfunction, sleep disturbances, urinary incontinence, urinary urgency, vaginal discharge and vaginal itching. Diet; she is on soft foods since her gastric bypass and protein shakes. She feels like she is still hungry. The patient states her exercise level is   . The patient's tobacco use is:  Social History   Tobacco Use  Smoking Status Never  Smokeless Tobacco Never  . She has been exposed to passive smoke. The patient's alcohol use is:  Social History   Substance and Sexual Activity  Alcohol Use Yes   Comment: occasional    Additional information: continues to go to [employer].    Review of Systems  Constitutional: Negative.   HENT: Negative.    Eyes: Negative.   Respiratory: Negative.    Cardiovascular: Negative.   Gastrointestinal: Negative.   Endocrine: Negative.   Genitourinary: Negative.   Musculoskeletal: Negative.   Skin: Negative.   Allergic/Immunologic: Negative.   Neurological: Negative.   Hematological: Negative.   Psychiatric/Behavioral: Negative.       Today's Vitals   05/07/22 1519   BP: 108/74  Pulse: 63  Temp: 98.1 F (36.7 C)  SpO2: 98%  Weight: 219 lb 6.4 oz (99.5 kg)  Height: 5\' 4"  (1.626 m)   Body mass index is 37.66 kg/m.  Wt Readings from Last 3 Encounters:  05/07/22 219 lb 6.4 oz (99.5 kg)  04/04/22 228 lb (103.4 kg)  03/26/22 234 lb (106.1 kg)    Objective:  Physical Exam Vitals reviewed.  Constitutional:      General: She is not in acute distress.    Appearance: Normal  appearance. She is well-developed. She is obese.  HENT:     Head: Normocephalic and atraumatic.     Right Ear: Hearing, tympanic membrane, ear canal and external ear normal. There is no impacted cerumen.     Left Ear: Hearing, tympanic membrane, ear canal and external ear normal. There is no impacted cerumen.     Nose:     Comments: Deferred - masked    Mouth/Throat:     Comments: Deferred - masked Eyes:     General: Lids are normal.     Extraocular Movements: Extraocular movements intact.     Conjunctiva/sclera: Conjunctivae normal.     Pupils: Pupils are equal, round, and reactive to light.     Funduscopic exam:    Right eye: No papilledema.        Left eye: No papilledema.  Neck:     Thyroid: No thyroid mass.     Vascular: No carotid bruit.  Cardiovascular:     Rate and Rhythm: Normal rate and regular rhythm.     Pulses: Normal pulses.     Heart sounds: Normal heart sounds. No murmur heard. Pulmonary:     Effort: Pulmonary effort is normal. No respiratory distress.     Breath sounds: Normal breath sounds. No wheezing.  Chest:     Chest wall: No mass.  Breasts:    Tanner Score is 5.     Right: Normal. No mass or tenderness.     Left: Normal. No mass or tenderness.  Abdominal:     General: Abdomen is flat. Bowel sounds are normal. There is no distension.     Palpations: Abdomen is soft.     Tenderness: There is no abdominal tenderness.  Genitourinary:    Comments: Deferred Musculoskeletal:        General: No swelling or tenderness. Normal range of motion.      Cervical back: Full passive range of motion without pain, normal range of motion and neck supple.     Right lower leg: No edema.     Left lower leg: No edema.  Lymphadenopathy:     Upper Body:     Right upper body: No supraclavicular, axillary or pectoral adenopathy.     Left upper body: No supraclavicular, axillary or pectoral adenopathy.  Skin:    General: Skin is warm and dry.     Capillary Refill: Capillary refill takes less than 2 seconds.  Neurological:     General: No focal deficit present.     Mental Status: She is alert and oriented to person, place, and time.     Cranial Nerves: No cranial nerve deficit.     Sensory: No sensory deficit.     Motor: No weakness.  Psychiatric:        Mood and Affect: Mood normal.        Behavior: Behavior normal.        Thought Content: Thought content normal.        Judgment: Judgment normal.         Assessment And Plan:     1. Encounter for annual health examination Behavior modifications discussed and diet history reviewed.   Pt will continue to exercise regularly and modify diet with low GI, plant based foods and decrease intake of processed foods.  Recommend intake of daily multivitamin, Vitamin D, and calcium.  Recommend mammogram and colonoscopy for preventive screenings, as well as recommend immunizations that include influenza, TDAP (up to date). Will request [facility] from [facility] for her mammogram/PAP info  2. Class 2 obesity due to excess calories without serious comorbidity with body mass index (BMI) of 37.0 to 37.9 in adult She is encouraged to strive for BMI less than 30 to decrease cardiac risk. Advised to aim for at least 150 minutes of exercise per week. When she is cleared by [employer].  Congratulated on her weight loss  3. Need for influenza vaccination Influenza vaccine administered Encouraged to take Tylenol as needed for fever or muscle aches. - Flu Vaccine QUAD 6+ mos PF IM (Fluarix Quad  PF)  4. Abnormal glucose Comments: Hemoglobin A1c has been stable.  Continue to focus on healthy diet and exercise when you are here by bariatrics - Hemoglobin A1c - BMP8+eGFR  5. Mixed hyperlipidemia Comments: Cholesterol levels are stable, continue low-fat diet. - Lipid panel  6. Vitamin D deficiency Will check vitamin D level and supplement as needed.    Also encouraged to spend 15 minutes in the sun daily.  - VITAMIN D 25 Hydroxy (Vit-D Deficiency, Fractures)  7. Other fatigue Comments: Will check for any metabolic causes including vitamin D.  May also be related to deconditioning.  She is not cleared to fully excise at this time. Negative rapid covid will send PCR, has been having runny nose and myalgias. - CBC no Diff - Novel Coronavirus, NAA ([facility]) - POC COVID-19 BinaxNow    Patient was given opportunity to ask questions. Patient verbalized understanding of the plan and was able to repeat key elements of the plan. All questions were answered to their satisfaction.   [name], FNP   I, [name], FNP, have reviewed all documentation for this visit. The documentation on 05/08/22 for the exam, diagnosis, procedures, and orders are all accurate and complete.   THE PATIENT IS ENCOURAGED TO PRACTICE SOCIAL DISTANCING DUE TO THE COVID-19 PANDEMIC.

## 2022-05-08 DIAGNOSIS — G501 Atypical facial pain: Secondary | ICD-10-CM | POA: Diagnosis not present

## 2022-05-08 LAB — BMP8+EGFR
BUN/Creatinine Ratio: 21 (ref 9–23)
BUN: 12 mg/dL (ref 6–24)
CO2: 24 mmol/L (ref 20–29)
Calcium: 9.1 mg/dL (ref 8.7–10.2)
Chloride: 103 mmol/L (ref 96–106)
Creatinine, Ser: 0.58 mg/dL (ref 0.57–1.00)
Glucose: 82 mg/dL (ref 70–99)
Potassium: 4 mmol/L (ref 3.5–5.2)
Sodium: 141 mmol/L (ref 134–144)
eGFR: 113 mL/min/{1.73_m2} (ref >59)

## 2022-05-08 LAB — CBC
Hematocrit: 31.6 % — ABNORMAL LOW (ref 34.0–46.6)
Hemoglobin: 10.4 g/dL — ABNORMAL LOW (ref 11.1–15.9)
MCH: 25.9 pg — ABNORMAL LOW (ref 26.6–33.0)
MCHC: 32.9 g/dL (ref 31.5–35.7)
MCV: 79 fL (ref 79–97)
Platelets: 255 10*3/uL (ref 150–450)
RBC: 4.02 x10E6/uL (ref 3.77–5.28)
RDW: 13.5 % (ref 11.7–15.4)
WBC: 3.8 10*3/uL (ref 3.4–10.8)

## 2022-05-08 LAB — LIPID PANEL
Chol/HDL Ratio: 3.3 ratio (ref 0.0–4.4)
Cholesterol, Total: 136 mg/dL (ref 100–199)
HDL: 41 mg/dL (ref >39)
LDL Chol Calc (NIH): 80 mg/dL (ref 0–99)
Triglycerides: 74 mg/dL (ref 0–149)
VLDL Cholesterol Cal: 15 mg/dL (ref 5–40)

## 2022-05-08 LAB — VITAMIN D 25 HYDROXY (VIT D DEFICIENCY, FRACTURES): Vit D, 25-Hydroxy: 110 ng/mL — ABNORMAL HIGH (ref 30.0–100.0)

## 2022-05-08 LAB — HEMOGLOBIN A1C
Est. average glucose Bld gHb Est-mCnc: 120 mg/dL
Hgb A1c MFr Bld: 5.8 % — ABNORMAL HIGH (ref 4.8–5.6)

## 2022-05-08 LAB — NOVEL CORONAVIRUS, NAA: SARS-CoV-2, NAA: NOT DETECTED

## 2022-05-09 DIAGNOSIS — M5412 Radiculopathy, cervical region: Secondary | ICD-10-CM | POA: Diagnosis not present

## 2022-05-09 DIAGNOSIS — Z79891 Long term (current) use of opiate analgesic: Secondary | ICD-10-CM | POA: Diagnosis not present

## 2022-05-09 DIAGNOSIS — M545 Low back pain, unspecified: Secondary | ICD-10-CM | POA: Diagnosis not present

## 2022-05-09 DIAGNOSIS — M542 Cervicalgia: Secondary | ICD-10-CM | POA: Diagnosis not present

## 2022-05-09 DIAGNOSIS — G894 Chronic pain syndrome: Secondary | ICD-10-CM | POA: Diagnosis not present

## 2022-05-09 DIAGNOSIS — G8929 Other chronic pain: Secondary | ICD-10-CM | POA: Diagnosis not present

## 2022-05-09 DIAGNOSIS — R202 Paresthesia of skin: Secondary | ICD-10-CM | POA: Diagnosis not present

## 2022-05-09 DIAGNOSIS — F411 Generalized anxiety disorder: Secondary | ICD-10-CM | POA: Diagnosis not present

## 2022-05-09 DIAGNOSIS — F330 Major depressive disorder, recurrent, mild: Secondary | ICD-10-CM | POA: Diagnosis not present

## 2022-05-14 ENCOUNTER — Ambulatory Visit: Payer: Commercial Managed Care - HMO | Admitting: Dietician

## 2022-05-14 LAB — IRON AND TIBC
Iron Saturation: 23 % (ref 15–55)
Iron: 63 ug/dL (ref 27–159)
Total Iron Binding Capacity: 271 ug/dL (ref 250–450)
UIBC: 208 ug/dL (ref 131–425)

## 2022-05-14 LAB — VITAMIN B12: Vitamin B-12: >2000 pg/mL — ABNORMAL HIGH (ref 232–1245)

## 2022-05-14 LAB — SPECIMEN STATUS REPORT

## 2022-05-14 LAB — FERRITIN: Ferritin: 272 ng/mL — ABNORMAL HIGH (ref 15–150)

## 2022-05-15 DIAGNOSIS — L853 Xerosis cutis: Secondary | ICD-10-CM | POA: Diagnosis not present

## 2022-05-15 DIAGNOSIS — D509 Iron deficiency anemia, unspecified: Secondary | ICD-10-CM | POA: Diagnosis not present

## 2022-05-15 DIAGNOSIS — H539 Unspecified visual disturbance: Secondary | ICD-10-CM | POA: Diagnosis not present

## 2022-05-15 DIAGNOSIS — E538 Deficiency of other specified B group vitamins: Secondary | ICD-10-CM | POA: Diagnosis not present

## 2022-05-15 DIAGNOSIS — Z9884 Bariatric surgery status: Secondary | ICD-10-CM | POA: Diagnosis not present

## 2022-05-21 ENCOUNTER — Encounter: Payer: Self-pay | Admitting: Dietician

## 2022-05-21 ENCOUNTER — Encounter: Payer: Medicaid Other | Attending: General Surgery | Admitting: Dietician

## 2022-05-21 VITALS — Ht 64.0 in | Wt 215.1 lb

## 2022-05-21 DIAGNOSIS — E669 Obesity, unspecified: Secondary | ICD-10-CM | POA: Diagnosis not present

## 2022-05-21 NOTE — Progress Notes (Signed)
Bariatric Nutrition Follow-Up Visit Medical Nutrition Therapy   Surgery date: 03/19/22 Surgery type: RYGB  NUTRITION ASSESSMENT  Anthropometrics  Start weight at [facility]: 241.4 lbs (date: 11/22/2021)  Height: 64 in Weight today: 215.1 lbs. BMI: 36.92 kg/m2     Clinical  Medical hx: Allergies, Anemia, Anxiety, arthritis, asthma, GERD, kidney disease, osteoporosis Medications: Esomeprazole, one a day vitamin, vit D, Calcium  Labs: Hemoglobin 10.4; Hematocrit 33.4; Vit D 110.0; B12 >2000; Notable signs/symptoms: nothing noted Any previous deficiencies? No Bowel Habits: Every day to every other day no complaints   Body Composition Scale 04/02/22 05/21/2022  Current Body Weight 228.0 215.1  Total Body Fat % 43.4 41.5  Visceral Fat 14 13  Fat-Free Mass % 56.5 58.4   Total Body Water % 42.7 43.7  Muscle-Mass lbs 31.1 31.3  BMI 39.0 36.8  Body Fat Displacement           Torso  lbs 61.3 55.3         Left Leg  lbs 12.2 11.0         Right Leg  lbs 12.2 11.0         Left Arm  lbs 6.1 5.5         Right Arm   lbs 6.1 5.5     Lifestyle & Dietary Hx  Pt states she is getting headaches and her ferrin, B12, Vit D are high after taking the multivitamin. Pt states the surgeon recommended that she change her multivitamin. Pt concerned that she can't find a calcium that doesn't also have vitamin D included, and her vitamin D is high right now.  Dietitian recommended Tums (calcium carbonate). Pt states she has not had a history of kidney stone.   Estimated daily fluid intake: 64 oz Estimated daily protein intake: 60 g Supplements: multivitamin (two a day instructions on bottle) and calcium Current average weekly physical activity: walking 3 times a week for 30 minutes.  24-Hr Dietary Recall First Meal: protein shake Snack: cheese stick  Second Meal: tuna fish Snack: quest protein chips  Third Meal: salmon or tuna Snack: sf pop cycle or coffee with protein shake Beverages: water, coffee,  vitamin water zero  Post-Op Goals/ Signs/ Symptoms Using straws: no Drinking while eating: no Chewing/swallowing difficulties: no Changes in vision: no Changes to mood/headaches: no Hair loss/changes to skin/nails: no Difficulty focusing/concentrating: no Sweating: no Limb weakness: no Dizziness/lightheadedness: no Palpitations: no  Carbonated/caffeinated beverages: no N/V/D/C/Gas: no Abdominal pain: no Dumping syndrome: no    NUTRITION DIAGNOSIS  Overweight/obesity ([place]-3.3) related to past poor dietary habits and physical inactivity as evidenced by completed bariatric surgery and following dietary guidelines for continued weight loss and healthy nutrition status.  Why you need complex carbohydrates: complex carbohydrates are required to have a healthy diet. Whole grains provide fiber which can help with blood glucose levels and help keep you satiated. Fruits and starchy and non-starch vegetables provide essential vitamins and minerals required for immune function, eyesight support, brain support, bone density, wound healing and many other functions within the body. According to the current evidenced based 2020-2025 Dietary Guidelines for Americans, complex carbohydrates are part of a healthy eating pattern which is associated with a decreased risk for type 2 diabetes, cancers, and cardiovascular disease.    NUTRITION INTERVENTION Nutrition counseling (C-1) and education (E-2) to facilitate bariatric surgery goals, including: Diet advancement to the next phase (phase 5) now including all vegetables The importance of consuming adequate calories as well as certain nutrients daily due  to the body's need for essential vitamins, minerals, and fats The importance of daily physical activity and to reach a goal of at least 150 minutes of moderate to vigorous physical activity weekly (or as directed by their physician) due to benefits such as increased musculature and improved lab values The  importance of intuitive eating specifically learning hunger-satiety cues and understanding the importance of learning a new body: The importance of mindful eating to avoid grazing behaviors   Goals - Take half dose daily of multivitamin (one of the "two a days") due to vit D and B12 being high - Get labs drawn before next visit  Handouts Provided Include  Phase 4/5 [place] for Change Teaching method utilized: Visual & Auditory  Demonstrated degree of understanding via: Teach Back  Readiness Level: Preparation Barriers to learning/adherence to lifestyle change: nothing identified  RD's Notes for Next Visit Assess adherence to pt chosen goals   MONITORING & EVALUATION Dietary intake, weekly physical activity, body weight.  Next Steps Patient is to follow-up in 3 months for 6 month post-op follow-up.

## 2022-05-23 DIAGNOSIS — F411 Generalized anxiety disorder: Secondary | ICD-10-CM | POA: Diagnosis not present

## 2022-05-23 DIAGNOSIS — F330 Major depressive disorder, recurrent, mild: Secondary | ICD-10-CM | POA: Diagnosis not present

## 2022-05-27 ENCOUNTER — Emergency Department (HOSPITAL_COMMUNITY)
Admission: EM | Admit: 2022-05-27 | Discharge: 2022-05-28 | Disposition: A | Discharge status: Home / Self Care | Payer: Medicaid Other | Attending: Student | Admitting: Student

## 2022-05-27 ENCOUNTER — Other Ambulatory Visit: Payer: Self-pay

## 2022-05-27 DIAGNOSIS — R109 Unspecified abdominal pain: Secondary | ICD-10-CM | POA: Insufficient documentation

## 2022-05-27 DIAGNOSIS — Z1152 Encounter for screening for COVID-19: Secondary | ICD-10-CM | POA: Diagnosis not present

## 2022-05-27 DIAGNOSIS — Z5321 Procedure and treatment not carried out due to patient leaving prior to being seen by health care provider: Secondary | ICD-10-CM | POA: Insufficient documentation

## 2022-05-27 DIAGNOSIS — R110 Nausea: Secondary | ICD-10-CM | POA: Diagnosis not present

## 2022-05-27 NOTE — ED Triage Notes (Signed)
Pt arrives with reports of abdominal pain, burning and cramping for the last few days. Pt reports increasing pain and nausea. Pt reports gastric bypass surgery in November. Pt reports she has been unable to eat without having pain and nausea.

## 2022-05-28 ENCOUNTER — Emergency Department (HOSPITAL_COMMUNITY): Payer: Medicaid Other

## 2022-05-28 ENCOUNTER — Encounter (HOSPITAL_COMMUNITY): Payer: Self-pay

## 2022-05-28 LAB — URINALYSIS, ROUTINE W REFLEX MICROSCOPIC
Bilirubin Urine: NEGATIVE
Glucose, UA: NEGATIVE mg/dL
Ketones, ur: NEGATIVE mg/dL
Nitrite: NEGATIVE
Protein, ur: NEGATIVE mg/dL
Specific Gravity, Urine: 1.006 (ref 1.005–1.030)
pH: 6 (ref 5.0–8.0)

## 2022-05-28 LAB — RESP PANEL BY RT-PCR (RSV, FLU A&B, COVID)  RVPGX2
Influenza A by PCR: NEGATIVE
Influenza B by PCR: NEGATIVE
Resp Syncytial Virus by PCR: NEGATIVE
SARS Coronavirus 2 by RT PCR: NEGATIVE

## 2022-05-28 LAB — CBC
HCT: 35.4 % — ABNORMAL LOW (ref 36.0–46.0)
Hemoglobin: 10.9 g/dL — ABNORMAL LOW (ref 12.0–15.0)
MCH: 25.2 pg — ABNORMAL LOW (ref 26.0–34.0)
MCHC: 30.8 g/dL (ref 30.0–36.0)
MCV: 81.8 fL (ref 80.0–100.0)
Platelets: 305 10*3/uL (ref 150–400)
RBC: 4.33 MIL/uL (ref 3.87–5.11)
RDW: 14 % (ref 11.5–15.5)
WBC: 6.1 10*3/uL (ref 4.0–10.5)
nRBC: 0 % (ref 0.0–0.2)

## 2022-05-28 LAB — COMPREHENSIVE METABOLIC PANEL
ALT: 43 U/L (ref 0–44)
AST: 31 U/L (ref 15–41)
Albumin: 4.7 g/dL (ref 3.5–5.0)
Alkaline Phosphatase: 61 U/L (ref 38–126)
Anion gap: 8 (ref 5–15)
BUN: 20 mg/dL (ref 6–20)
CO2: 26 mmol/L (ref 22–32)
Calcium: 9.7 mg/dL (ref 8.9–10.3)
Chloride: 102 mmol/L (ref 98–111)
Creatinine, Ser: 0.62 mg/dL (ref 0.44–1.00)
GFR, Estimated: >60 mL/min (ref >60)
Glucose, Bld: 108 mg/dL — ABNORMAL HIGH (ref 70–99)
Potassium: 3.8 mmol/L (ref 3.5–5.1)
Sodium: 136 mmol/L (ref 135–145)
Total Bilirubin: 0.3 mg/dL (ref 0.3–1.2)
Total Protein: 8.1 g/dL (ref 6.5–8.1)

## 2022-05-28 LAB — LIPASE, BLOOD: Lipase: 36 U/L (ref 11–51)

## 2022-05-28 LAB — PREGNANCY, URINE: Preg Test, Ur: NEGATIVE

## 2022-05-28 MED ORDER — IOHEXOL 300 MG/ML  SOLN
100.0000 mL | Freq: Once | INTRAMUSCULAR | Status: AC | PRN
  Administered 2022-05-28: 100 mL via INTRAVENOUS

## 2022-05-30 ENCOUNTER — Other Ambulatory Visit: Payer: Self-pay | Admitting: Gastroenterology

## 2022-05-31 ENCOUNTER — Other Ambulatory Visit: Payer: Self-pay

## 2022-06-03 DIAGNOSIS — F411 Generalized anxiety disorder: Secondary | ICD-10-CM | POA: Diagnosis not present

## 2022-06-03 DIAGNOSIS — F330 Major depressive disorder, recurrent, mild: Secondary | ICD-10-CM | POA: Diagnosis not present

## 2022-06-05 ENCOUNTER — Telehealth: Payer: Self-pay | Admitting: Hematology

## 2022-06-05 NOTE — Telephone Encounter (Signed)
Called patient per provider PAL to reschedule appointments. Left voicemail with new appointment information as well as direct contact information if needing to reschedule.  

## 2022-06-06 DIAGNOSIS — Z79891 Long term (current) use of opiate analgesic: Secondary | ICD-10-CM | POA: Diagnosis not present

## 2022-06-06 DIAGNOSIS — Z9884 Bariatric surgery status: Secondary | ICD-10-CM | POA: Diagnosis not present

## 2022-06-06 DIAGNOSIS — M542 Cervicalgia: Secondary | ICD-10-CM | POA: Diagnosis not present

## 2022-06-06 DIAGNOSIS — M545 Low back pain, unspecified: Secondary | ICD-10-CM | POA: Diagnosis not present

## 2022-06-06 DIAGNOSIS — R7303 Prediabetes: Secondary | ICD-10-CM | POA: Diagnosis not present

## 2022-06-06 DIAGNOSIS — G8929 Other chronic pain: Secondary | ICD-10-CM | POA: Diagnosis not present

## 2022-06-06 DIAGNOSIS — R202 Paresthesia of skin: Secondary | ICD-10-CM | POA: Diagnosis not present

## 2022-06-06 DIAGNOSIS — M5412 Radiculopathy, cervical region: Secondary | ICD-10-CM | POA: Diagnosis not present

## 2022-06-06 DIAGNOSIS — G894 Chronic pain syndrome: Secondary | ICD-10-CM | POA: Diagnosis not present

## 2022-06-06 DIAGNOSIS — E669 Obesity, unspecified: Secondary | ICD-10-CM | POA: Diagnosis not present

## 2022-06-19 ENCOUNTER — Telehealth: Payer: Self-pay | Admitting: Gastroenterology

## 2022-06-19 MED ORDER — ESOMEPRAZOLE MAGNESIUM 40 MG PO CPDR: 40.0000 mg | DELAYED_RELEASE_CAPSULE | Freq: Two times a day (BID) | ORAL | 1 refills | Status: DC

## 2022-06-19 NOTE — Telephone Encounter (Signed)
Patient called requesting a refill on Nexium.

## 2022-06-19 NOTE — Telephone Encounter (Signed)
Script sent to pharmacy.  Patient needs appointment for further refills.

## 2022-06-20 DIAGNOSIS — F330 Major depressive disorder, recurrent, mild: Secondary | ICD-10-CM | POA: Diagnosis not present

## 2022-06-20 DIAGNOSIS — F411 Generalized anxiety disorder: Secondary | ICD-10-CM | POA: Diagnosis not present

## 2022-07-03 DIAGNOSIS — F411 Generalized anxiety disorder: Secondary | ICD-10-CM | POA: Diagnosis not present

## 2022-07-03 DIAGNOSIS — F330 Major depressive disorder, recurrent, mild: Secondary | ICD-10-CM | POA: Diagnosis not present

## 2022-07-04 DIAGNOSIS — R202 Paresthesia of skin: Secondary | ICD-10-CM | POA: Diagnosis not present

## 2022-07-04 DIAGNOSIS — G894 Chronic pain syndrome: Secondary | ICD-10-CM | POA: Diagnosis not present

## 2022-07-04 DIAGNOSIS — G8929 Other chronic pain: Secondary | ICD-10-CM | POA: Diagnosis not present

## 2022-07-04 DIAGNOSIS — M545 Low back pain, unspecified: Secondary | ICD-10-CM | POA: Diagnosis not present

## 2022-07-04 DIAGNOSIS — M5412 Radiculopathy, cervical region: Secondary | ICD-10-CM | POA: Diagnosis not present

## 2022-07-04 DIAGNOSIS — M542 Cervicalgia: Secondary | ICD-10-CM | POA: Diagnosis not present

## 2022-07-04 DIAGNOSIS — Z79891 Long term (current) use of opiate analgesic: Secondary | ICD-10-CM | POA: Diagnosis not present

## 2022-07-12 ENCOUNTER — Other Ambulatory Visit: Payer: Self-pay | Admitting: Gastroenterology

## 2022-07-16 DIAGNOSIS — F330 Major depressive disorder, recurrent, mild: Secondary | ICD-10-CM | POA: Diagnosis not present

## 2022-07-16 DIAGNOSIS — F411 Generalized anxiety disorder: Secondary | ICD-10-CM | POA: Diagnosis not present

## 2022-07-30 DIAGNOSIS — F411 Generalized anxiety disorder: Secondary | ICD-10-CM | POA: Diagnosis not present

## 2022-07-30 DIAGNOSIS — F330 Major depressive disorder, recurrent, mild: Secondary | ICD-10-CM | POA: Diagnosis not present

## 2022-08-06 DIAGNOSIS — R202 Paresthesia of skin: Secondary | ICD-10-CM | POA: Diagnosis not present

## 2022-08-06 DIAGNOSIS — G8929 Other chronic pain: Secondary | ICD-10-CM | POA: Diagnosis not present

## 2022-08-06 DIAGNOSIS — M545 Low back pain, unspecified: Secondary | ICD-10-CM | POA: Diagnosis not present

## 2022-08-06 DIAGNOSIS — M542 Cervicalgia: Secondary | ICD-10-CM | POA: Diagnosis not present

## 2022-08-06 DIAGNOSIS — Z79891 Long term (current) use of opiate analgesic: Secondary | ICD-10-CM | POA: Diagnosis not present

## 2022-08-06 DIAGNOSIS — G894 Chronic pain syndrome: Secondary | ICD-10-CM | POA: Diagnosis not present

## 2022-08-06 DIAGNOSIS — M5412 Radiculopathy, cervical region: Secondary | ICD-10-CM | POA: Diagnosis not present

## 2022-08-07 ENCOUNTER — Ambulatory Visit: Payer: Commercial Managed Care - HMO | Admitting: Hematology

## 2022-08-07 ENCOUNTER — Other Ambulatory Visit: Payer: Commercial Managed Care - HMO

## 2022-08-13 DIAGNOSIS — F411 Generalized anxiety disorder: Secondary | ICD-10-CM | POA: Diagnosis not present

## 2022-08-13 DIAGNOSIS — F330 Major depressive disorder, recurrent, mild: Secondary | ICD-10-CM | POA: Diagnosis not present

## 2022-08-15 ENCOUNTER — Ambulatory Visit (INDEPENDENT_AMBULATORY_CARE_PROVIDER_SITE_OTHER): Payer: Medicaid Other

## 2022-08-15 ENCOUNTER — Encounter (HOSPITAL_COMMUNITY): Payer: Self-pay

## 2022-08-15 ENCOUNTER — Ambulatory Visit (HOSPITAL_COMMUNITY)
Admission: RE | Admit: 2022-08-15 | Discharge: 2022-08-15 | Disposition: A | Discharge status: Home / Self Care | Payer: Medicaid Other | Source: Ambulatory Visit | Attending: Internal Medicine | Admitting: Internal Medicine

## 2022-08-15 VITALS — BP 101/72 | HR 69 | Temp 98.1°F | Resp 18

## 2022-08-15 DIAGNOSIS — R0602 Shortness of breath: Secondary | ICD-10-CM

## 2022-08-15 DIAGNOSIS — J069 Acute upper respiratory infection, unspecified: Secondary | ICD-10-CM | POA: Diagnosis not present

## 2022-08-15 DIAGNOSIS — R053 Chronic cough: Secondary | ICD-10-CM

## 2022-08-15 MED ORDER — AMOXICILLIN-POT CLAVULANATE 875-125 MG PO TABS: 1.0000 | ORAL_TABLET | Freq: Two times a day (BID) | ORAL | 0 refills | Status: DC

## 2022-08-15 MED ORDER — ALBUTEROL SULFATE HFA 108 (90 BASE) MCG/ACT IN AERS: 1.0000 | INHALATION_SPRAY | Freq: Four times a day (QID) | RESPIRATORY_TRACT | 0 refills | Status: AC | PRN

## 2022-08-15 MED ORDER — BENZONATATE 100 MG PO CAPS: 100.0000 mg | ORAL_CAPSULE | Freq: Three times a day (TID) | ORAL | 0 refills | Status: DC | PRN

## 2022-08-15 NOTE — Discharge Instructions (Signed)
Chest x-ray was normal.  I have prescribed an albuterol inhaler, antibiotic, cough medication.  Follow-up if any symptoms persist or worsen.

## 2022-08-15 NOTE — ED Provider Notes (Signed)
[facility]    CSN: [id number] Arrival date & time: 08/15/22  1841      History   Chief Complaint Chief Complaint  Patient presents with   Nasal Congestion    Chest fullness and heaviness with pain. Headache, Ear and nose pain and runny nose - Entered by patient   Cough   Sore Throat   Headache    HPI [name] is a 47 y.o. female.   Patient presents with cough, nasal congestion, shortness of breath.  Patient reports cough and nasal congestion started about 1.5 to 2 weeks ago.  She has taken Benadryl, Zyrtec, Flonase, Afrin, claritin with no improvement in symptoms.  Patient reports that she started to develop shortness of breath with exertion over the past few days.  Denies history of asthma or COPD and does not smoke cigarettes.  Denies chest pain, sore throat, gastrointestinal symptoms.  Denies any known sick contacts or fever.   Cough Sore Throat  Headache   Past Medical History:  Diagnosis Date   Allergy    Anemia    Anxiety    Apnea 12/08/2019   Arthritis    Asthma    Back pain    Constipation    Exertional dyspnea 03/14/2020   Fatty liver    GERD (gastroesophageal reflux disease)    H/O: hysterectomy    Hx of endometriosis    Inappropriate sinus tachycardia 12/08/2019   Insomnia    Joint pain    Lactose intolerance    Migraine    Morbid obesity 12/08/2019   Osteoporosis    Urticaria    Vitamin D deficiency     Patient Active Problem List   Diagnosis Date Noted   S/P gastric bypass 03/19/2022   Microcytic anemia 12/14/2021   Iron deficiency anemia 12/14/2021   Adverse food reaction 03/21/2021   Ascites 10/05/2020   Other insomnia 07/27/2020   At risk for side effect of medication 07/27/2020   Other hyperlipidemia 05/15/2020   At risk for osteoporosis 05/15/2020   At risk for impaired metabolic function 04/04/2020   Other fatigue 03/22/2020   SOBOE (shortness of breath on exertion) 03/22/2020   Exertional dyspnea  03/14/2020   Neuropathy, right side of face 03/08/2020   At risk for depression 03/08/2020   Primary osteoarthritis of both feet 02/26/2020   Hyperlipidemia 01/26/2020   Prediabetes 01/26/2020   At risk for heart disease 01/26/2020   Daytime somnolence 01/26/2020   Vitamin D deficiency 01/04/2020   Fatty liver 01/04/2020   B12 deficiency 01/04/2020   Moderate persistent asthma without complication 12/31/2019   Inappropriate sinus tachycardia 12/08/2019   Apnea 12/08/2019   Anaphylactic reaction due to food, subsequent encounter 11/22/2019   Idiopathic urticaria 11/22/2019   Generalized anxiety disorder 04/08/2018   Atypical chest pain    Regurgitation of food    Laryngopharyngeal reflux (LPR) 07/30/2017   Throat soreness 07/30/2017   ANA positive 12/30/2016   Gastroesophageal reflux disease 06/27/2016   Disturbance of skin sensation 04/27/2014   Dizziness and giddiness 03/14/2014   Headache, migraine 03/14/2014   Myalgia and myositis, unspecified 06/07/2013   Nausea and vomiting in adult 08/22/2012   Viral syndrome 08/22/2012   Pelvic pain in female 12/18/2010   ANEMIA-IRON DEFICIENCY 10/15/2006   HERPES, GENITAL NOS 09/30/2006   Obesity (BMI 35.0-39.9 without comorbidity) 09/30/2006   DISORDER, NONORGANIC SLEEP NOS 09/30/2006   CONSTIPATION NOS 09/30/2006   NEURITIS, LUMBOSACRAL NOS 09/30/2006   HX, PERSONAL, MENTAL DISORDER NOS 09/30/2006  Past Surgical History:  Procedure Laterality Date   40 HOUR PH STUDY N/A 01/12/2018   Procedure: 24 HOUR PH STUDY;  Surgeon: Napoleon Form, MD;  Location: [facility];  Service: Endoscopy;  Laterality: N/A;   ABDOMINAL HYSTERECTOMY  12/12/2003   ADENOIDECTOMY  05/2011   ANKLE ARTHROSCOPY  12/29/2007   right; with extensive debridement   BLADDER NECK RECONSTRUCTION  01/14/2011   Procedure: BLADDER NECK REPAIR;  Surgeon: Reva Bores, MD;  Location: [facility];  Service: Gynecology;  Laterality: N/A;  Laparoscopic Repair of  Incidental Cystotomy   BREAST REDUCTION SURGERY  08/05/2011   Procedure: MAMMARY REDUCTION  (BREAST);  Surgeon: Louisa Second, MD;  Location: [facility];  Service: Plastics;  Laterality: Bilateral;  bilateral   CESAREAN SECTION  12/29/2000; 1994   CHOLECYSTECTOMY     age 74   DILATION AND CURETTAGE OF UTERUS  07/08/2003   open laparoscopy   ESOPHAGEAL MANOMETRY N/A 01/12/2018   Procedure: ESOPHAGEAL MANOMETRY (EM);  Surgeon: Napoleon Form, MD;  Location: [facility];  Service: Endoscopy;  Laterality: N/A;   GASTRIC ROUX-EN-Y N/A 03/19/2022   Procedure: LAPAROSCOPIC ROUX-EN-Y GASTRIC BYPASS WITH UPPER ENDOSCOPY;  Surgeon: Gaynelle Adu, MD;  Location: [facility];  Service: General;  Laterality: N/A;   GIVENS CAPSULE STUDY N/A 06/23/2017   Procedure: GIVENS CAPSULE STUDY;  Surgeon: Benancio Deeds, MD;  Location: [facility];  Service: Gastroenterology;  Laterality: N/A;   HIATAL HERNIA REPAIR N/A 03/19/2022   Procedure: HERNIA REPAIR HIATAL;  Surgeon: Gaynelle Adu, MD;  Location: [facility];  Service: General;  Laterality: N/A;   LAPAROSCOPIC SALPINGOOPHERECTOMY  01/14/2011   left   PH IMPEDANCE STUDY N/A 01/12/2018   Procedure: PH IMPEDANCE STUDY;  Surgeon: Napoleon Form, MD;  Location: [facility];  Service: Endoscopy;  Laterality: N/A;   RESECTION DISTAL CLAVICAL Right 09/25/2017   Procedure: RESECTION DISTAL CLAVICAL;  Surgeon: Bjorn Pippin, MD;  Location: [facility];  Service: Orthopedics;  Laterality: Right;   RIGHT OOPHORECTOMY  2011   with lysis of adhesions   SHOULDER ACROMIOPLASTY Right 09/25/2017   Procedure: SHOULDER ACROMIOPLASTY;  Surgeon: Bjorn Pippin, MD;  Location: [facility];  Service: Orthopedics;  Laterality: Right;   SHOULDER ARTHROSCOPY WITH DEBRIDEMENT AND BICEP TENDON REPAIR Right 09/25/2017   Procedure: SHOULDER ARTHROSCOPY WITH DEBRIDEMENT AND BICEP TENDON REPAIR;  Surgeon: Bjorn Pippin, MD;  Location:  [facility];  Service: Orthopedics;  Laterality: Right;   SHOULDER ARTHROSCOPY WITH ROTATOR CUFF REPAIR Right 09/25/2017   Procedure: SHOULDER ARTHROSCOPY WITH ROTATOR CUFF REPAIR;  Surgeon: Bjorn Pippin, MD;  Location: [facility];  Service: Orthopedics;  Laterality: Right;   TONSILLECTOMY  as a child   TUBAL LIGATION  12/29/2000   UPPER GASTROINTESTINAL ENDOSCOPY      OB History     Gravida  2   Para  2   Term  2   Preterm      AB      Living  2      SAB      IAB      Ectopic      Multiple      Live Births               Home Medications    Prior to Admission medications   Medication Sig Start Date End Date Taking? Authorizing Provider  acetaminophen (TYLENOL) 325 MG tablet Take 2 tablets (650 mg total)  by mouth every 6 (six) hours as needed. 04/16/22  Yes Rising, Lurena Joiner, PA-C  albuterol (VENTOLIN HFA) 108 (90 Base) MCG/ACT inhaler Inhale 1-2 puffs into the lungs every 6 (six) hours as needed for wheezing or shortness of breath. 08/15/22  Yes Nisreen Guise, Rolly Salter E, FNP  amoxicillin-clavulanate (AUGMENTIN) 875-125 MG tablet Take 1 tablet by mouth every 12 (twelve) hours. 08/15/22  Yes Nneka Blanda, Rolly Salter E, FNP  b complex vitamins capsule Take 1 capsule by mouth daily.   Yes [provider]  benzonatate (TESSALON) 100 MG capsule Take 1 capsule (100 mg total) by mouth every 8 (eight) hours as needed for cough. 08/15/22  Yes Carvin Almas, Rolly Salter E, FNP  calcium carbonate (OS-CAL - DOSED IN MG OF ELEMENTAL CALCIUM) 1250 (500 Ca) MG tablet Take 1 tablet by mouth daily with breakfast.   Yes [provider]  cetirizine (ZYRTEC) 10 MG tablet TAKE 1 TABLET (10 MG TOTAL) BY MOUTH DAILY AS NEEDED FOR RHINITIS. Patient taking differently: Take 10 mg by mouth daily as needed for rhinitis or allergies. 06/25/21  Yes Verlee Monte, MD  clonazePAM (KLONOPIN) 1 MG tablet Take 0.5-1 mg by mouth daily as needed (sleep). 01/01/22  Yes [provider]   esomeprazole (NEXIUM) 40 MG capsule Take 1 capsule (40 mg total) by mouth 2 (two) times daily before a meal. Please schedule a follow up appointment for further refills. 06/19/22  Yes Armbruster, Willaim Rayas, MD  Multiple Vitamin (MULTIVITAMIN WITH MINERALS) TABS tablet Take 1 tablet by mouth daily.   Yes [provider]  RESTASIS 0.05 % ophthalmic emulsion Place 1 drop into both eyes 2 (two) times daily as needed (dryness). 11/04/19  Yes [provider]  EPINEPHrine (EPIPEN 2-PAK) 0.3 mg/0.3 mL IJ SOAJ injection Inject 0.3 mg into the muscle as needed for anaphylaxis. 03/21/21   Verlee Monte, MD  fluticasone furoate-vilanterol (BREO ELLIPTA) 100-25 MCG/INH AEPB Inhale 1 puff into the lungs daily as needed. Patient taking differently: Inhale 1 puff into the lungs daily as needed (shortness of breath). 12/18/20   Glenford Bayley, NP  ondansetron (ZOFRAN) 4 MG tablet Take 1 tablet (4 mg total) by mouth every 6 (six) hours. 04/16/22   Rising, Lurena Joiner, PA-C  tiZANidine (ZANAFLEX) 4 MG tablet Take 4 mg by mouth every 6 (six) hours as needed for muscle spasms. 11/16/21   [provider]    Family History Family History  Problem Relation Age of Onset   Diabetes Mother    Hypertension Mother    Hyperlipidemia Mother    Sleep apnea Mother    Thyroid disease Sister    Dementia Father    Heart attack Maternal Grandmother    Stroke Maternal Grandfather    Asthma Son    Healthy Son    Asthma Daughter    Healthy Daughter    Colon cancer Neg Hx    Colon polyps Neg Hx    Esophageal cancer Neg Hx    Rectal cancer Neg Hx    Stomach cancer Neg Hx     Social History Social History   Tobacco Use   Smoking status: Never   Smokeless tobacco: Never  Vaping Use   Vaping Use: Never used  Substance Use Topics   Alcohol use: Yes    Comment: occasional    Drug use: No     Allergies   Aspirin, Ibuprofen, [place] [fish allergy], and Zoledronic acid   Review of  Systems Review of Systems Per HPI  Physical Exam Triage  Vital Signs ED Triage Vitals  Enc Vitals Group     BP 08/15/22 1858 101/72     Pulse Rate 08/15/22 1858 69     Resp 08/15/22 1858 18     Temp 08/15/22 1858 98.1 F (36.7 C)     Temp Source 08/15/22 1858 Oral     SpO2 08/15/22 1858 97 %     Weight --      Height --      Head Circumference --      Peak Flow --      Pain Score 08/15/22 1855 7     Pain Loc --      Pain Edu? --      Excl. in GC? --    No data found.  Updated Vital Signs BP 101/72 (BP Location: Left Arm)   Pulse 69   Temp 98.1 F (36.7 C) (Oral)   Resp 18   LMP 12/27/2003   SpO2 97%   Visual Acuity Right Eye Distance:   Left Eye Distance:   Bilateral Distance:    Right Eye Near:   Left Eye Near:    Bilateral Near:     Physical Exam Constitutional:      General: She is not in acute distress.    Appearance: Normal appearance. She is not toxic-appearing or diaphoretic.  HENT:     Head: Normocephalic and atraumatic.     Right Ear: Tympanic membrane and ear canal normal.     Left Ear: Tympanic membrane and ear canal normal.     Nose: Congestion present.     Mouth/Throat:     Mouth: Mucous membranes are moist.     Pharynx: No posterior oropharyngeal erythema.  Eyes:     Extraocular Movements: Extraocular movements intact.     Conjunctiva/sclera: Conjunctivae normal.     Pupils: Pupils are equal, round, and reactive to light.  Cardiovascular:     Rate and Rhythm: Normal rate and regular rhythm.     Pulses: Normal pulses.     Heart sounds: Normal heart sounds.  Pulmonary:     Effort: Pulmonary effort is normal. No respiratory distress.     Breath sounds: Normal breath sounds. No stridor. No wheezing, rhonchi or rales.  Abdominal:     General: Abdomen is flat. Bowel sounds are normal.     Palpations: Abdomen is soft.  Musculoskeletal:        General: Normal range of motion.     Cervical back: Normal range of motion.  Skin:    General:  Skin is warm and dry.  Neurological:     General: No focal deficit present.     Mental Status: She is alert and oriented to person, place, and time. Mental status is at baseline.  Psychiatric:        Mood and Affect: Mood normal.        Behavior: Behavior normal.      UC Treatments / Results  Labs (all labs ordered are listed, but only abnormal results are displayed) Labs Reviewed - No data to display  EKG   Radiology DG Chest 2 View  Result Date: 08/15/2022 CLINICAL DATA:  Shortness of breath. EXAM: CHEST - 2 VIEW COMPARISON:  None Available. FINDINGS: The heart size and mediastinal contours are within normal limits. Both lungs are clear. The visualized skeletal structures are unremarkable. IMPRESSION: No active cardiopulmonary disease. Electronically Signed   By: Ted Mcalpine M.D.   On: 08/15/2022 19:44    Procedures Procedures (  including critical care time)  Medications Ordered in UC Medications - No data to display  Initial Impression / Assessment and Plan / UC Course  I have reviewed the triage vital signs and the nursing notes.  Pertinent labs & imaging results that were available during my care of the patient were reviewed by me and considered in my medical decision making (see chart for details).     Patient's symptoms most likely started off as a viral etiology but given duration of symptoms, I am concerned for secondary bacterial infection.  Nasal congestion has been persistent and patient reports this is most significant symptom.  Therefore, will treat with Augmentin antibiotic for sinus infection.  Chest x-ray was negative for any acute cardiopulmonary process.  Cough medication also prescribed for patient.  Albuterol inhaler prescribed for patient to use at home as needed for shortness of breath.  There are no signs of adventitious lung sounds on exam or respiratory compromise so do not think that emergent evaluation is necessary.  Advised return precautions.   Patient verbalized understanding and was agreeable with plan. Final Clinical Impressions(s) / UC Diagnoses   Final diagnoses:  Acute upper respiratory infection  Persistent cough     Discharge Instructions      Chest x-ray was normal.  I have prescribed an albuterol inhaler, antibiotic, cough medication.  Follow-up if any symptoms persist or worsen.     ED Prescriptions     Medication Sig Dispense Auth. Provider   amoxicillin-clavulanate (AUGMENTIN) 875-125 MG tablet Take 1 tablet by mouth every 12 (twelve) hours. 14 tablet [place], [place], [place]   benzonatate (TESSALON) 100 MG capsule Take 1 capsule (100 mg total) by mouth every 8 (eight) hours as needed for cough. 21 capsule [place], [place], [place]   albuterol (VENTOLIN HFA) 108 (90 Base) MCG/ACT inhaler Inhale 1-2 puffs into the lungs every 6 (six) hours as needed for wheezing or shortness of breath. 1 each [place], [place]      PDMP not reviewed this encounter.   Gustavus [place], [place] 08/15/22 2005

## 2022-08-15 NOTE — ED Triage Notes (Signed)
Pt states she has had cough, congestion, sore throat, she does feel short of breath when walking a lot X 1.5-2 weeks. She has tried benadryl, zyrtec, Flonase, afrin and Claritin. Pt states she has a couple inhalers at home but doesn't feel like she needed to use them.

## 2022-08-19 ENCOUNTER — Ambulatory Visit: Payer: Medicaid Other | Admitting: Dietician

## 2022-08-20 ENCOUNTER — Encounter: Payer: Medicaid Other | Attending: General Surgery | Admitting: Dietician

## 2022-08-20 ENCOUNTER — Encounter: Payer: Self-pay | Admitting: Dietician

## 2022-08-20 VITALS — Ht 64.0 in | Wt 198.6 lb

## 2022-08-20 DIAGNOSIS — E669 Obesity, unspecified: Secondary | ICD-10-CM | POA: Diagnosis not present

## 2022-08-20 NOTE — Progress Notes (Signed)
Bariatric Nutrition Follow-Up Visit Medical Nutrition Therapy   Surgery date: 03/19/22 Surgery type: RYGB  NUTRITION ASSESSMENT  Anthropometrics  Start weight at [facility]: 241.4 lbs (date: 11/22/2021)  Height: 64 in Weight today: 198.6 lbs. BMI: 34.09 kg/m2     Clinical  Medical hx: Allergies, Anemia, Anxiety, arthritis, asthma, GERD, kidney disease, osteoporosis Medications: Esomeprazole, one a day vitamin, vit D, Calcium  Labs: Hemoglobin 10.4; Hematocrit 33.4; Vit D 110.0; B12 >2000; Notable signs/symptoms: nothing noted Any previous deficiencies? No Bowel Habits: Every day to every other day no complaints   Body Composition Scale 04/02/22 05/21/2022 08/20/2022  Current Body Weight 228.0 215.1 198.6  Total Body Fat % 43.4 41.5 39.4  Visceral Fat Fat-Free Mass % 56.5 58.4 60.5   Total Body Water % 42.7 43.7 44.7  Muscle-Mass lbs 31.1 31.3 31.0  BMI 39.0 36.8 33.9  Body Fat Displacement            Torso  lbs 61.3 55.3 48.4         Left Leg  lbs 12.2 11.0 9.6         Right Leg  lbs 12.2 11.0 9.6         Left Arm  lbs 6.1 5.5 4.8         Right Arm   lbs 6.1 5.5 4.8     Lifestyle & Dietary Hx  Pt states she is not having headaches anymore. Pt states some days she takes one multivitamin and some days she takes two. Pt states she is taking a calcium (with vitamin D) chew 3 times a day. Pt states she is doing well and feeling okay, stating she was on an antibiotic and felt a burning sensation in her stomach. Pt states she has a jump rope and was going to start doing that, stating she is not motivated to do physical activity. Pt states she works all the time, and doesn't feel like doing anything else.  Estimated daily fluid intake: 64 oz Estimated daily protein intake: 60-70 g Supplements: multivitamin (two a day instructions on bottle) and calcium Current average weekly physical activity: walking 2 times a week for 30 minutes.  24-Hr Dietary Recall First Meal:  protein shake Snack: cheese stick  Second Meal: tuna fish or salmon patty or cheese stick or peanut butter Snack: quest protein chips, fruit Third Meal: salmon or tuna, broccoli or corn Snack: sf pop cycle or coffee with protein shake Beverages: water, coffee, vitamin water zero  Post-Op Goals/ Signs/ Symptoms Using straws: no Drinking while eating: no Chewing/swallowing difficulties: no Changes in vision: no Changes to mood/headaches: no Hair loss/changes to skin/nails: no Difficulty focusing/concentrating: no Sweating: no Limb weakness: no Dizziness/lightheadedness: no Palpitations: no  Carbonated/caffeinated beverages: no N/V/D/C/Gas: no Abdominal pain: no Dumping syndrome: no    NUTRITION DIAGNOSIS  Overweight/obesity ([place]-3.3) related to past poor dietary habits and physical inactivity as evidenced by completed bariatric surgery and following dietary guidelines for continued weight loss and healthy nutrition status.  Why you need complex carbohydrates: complex carbohydrates are required to have a healthy diet. Whole grains provide fiber which can help with blood glucose levels and help keep you satiated. Fruits and starchy and non-starch vegetables provide essential vitamins and minerals required for immune function, eyesight support, brain support, bone density, wound healing and many other functions within the body. According to the current evidenced based 2020-2025 Dietary Guidelines for Americans, complex carbohydrates are part of a healthy eating pattern which is associated  with a decreased risk for type 2 diabetes, cancers, and cardiovascular disease.    NUTRITION INTERVENTION Nutrition counseling (C-1) and education (E-2) to facilitate bariatric surgery goals, including: The importance of consuming adequate calories as well as certain nutrients daily due to the body's need for essential vitamins, minerals, and fats The importance of daily physical activity and to reach a  goal of at least 150 minutes of moderate to vigorous physical activity weekly (or as directed by their physician) due to benefits such as increased musculature and improved lab values The importance of intuitive eating specifically learning hunger-satiety cues and understanding the importance of learning a new body: The importance of mindful eating to avoid grazing behaviors   Goals - Take half dose daily of multivitamin (one of the "two a days") due to vit D and B12 being high - Get labs drawn before next visit -New: increase physical activity  Handouts Provided Include  Maintenance Healthy Eating Guide (6 months to maintenance) Health Benefits of Physical Activity  Learning Style & Readiness for Change Teaching method utilized: Visual & Auditory  Demonstrated degree of understanding via: Teach Back  Readiness Level: Preparation Barriers to learning/adherence to lifestyle change: nothing identified  RD's Notes for Next Visit Assess adherence to pt chosen goals  MONITORING & EVALUATION Dietary intake, weekly physical activity, body weight.  Next Steps Patient is to follow-up in 3 months for 9 month post-op follow-up.

## 2022-08-21 DIAGNOSIS — R109 Unspecified abdominal pain: Secondary | ICD-10-CM | POA: Insufficient documentation

## 2022-08-21 DIAGNOSIS — G8929 Other chronic pain: Secondary | ICD-10-CM | POA: Insufficient documentation

## 2022-08-21 DIAGNOSIS — O99019 Anemia complicating pregnancy, unspecified trimester: Secondary | ICD-10-CM | POA: Insufficient documentation

## 2022-08-21 DIAGNOSIS — N921 Excessive and frequent menstruation with irregular cycle: Secondary | ICD-10-CM | POA: Insufficient documentation

## 2022-08-21 DIAGNOSIS — Z98891 History of uterine scar from previous surgery: Secondary | ICD-10-CM | POA: Insufficient documentation

## 2022-08-27 ENCOUNTER — Other Ambulatory Visit: Payer: Self-pay

## 2022-08-27 DIAGNOSIS — D500 Iron deficiency anemia secondary to blood loss (chronic): Secondary | ICD-10-CM

## 2022-08-28 ENCOUNTER — Other Ambulatory Visit: Payer: Self-pay

## 2022-08-28 ENCOUNTER — Inpatient Hospital Stay (HOSPITAL_BASED_OUTPATIENT_CLINIC_OR_DEPARTMENT_OTHER): Payer: Medicaid Other | Admitting: Hematology

## 2022-08-28 ENCOUNTER — Inpatient Hospital Stay: Payer: Medicaid Other | Attending: Hematology

## 2022-08-28 VITALS — BP 106/68 | HR 66 | Temp 98.6°F | Resp 20 | Wt 200.0 lb

## 2022-08-28 DIAGNOSIS — D563 Thalassemia minor: Secondary | ICD-10-CM | POA: Insufficient documentation

## 2022-08-28 DIAGNOSIS — D509 Iron deficiency anemia, unspecified: Secondary | ICD-10-CM | POA: Insufficient documentation

## 2022-08-28 DIAGNOSIS — D500 Iron deficiency anemia secondary to blood loss (chronic): Secondary | ICD-10-CM

## 2022-08-28 LAB — CMP (CANCER CENTER ONLY)
ALT: 50 U/L — ABNORMAL HIGH (ref 0–44)
AST: 32 U/L (ref 15–41)
Albumin: 4.1 g/dL (ref 3.5–5.0)
Alkaline Phosphatase: 65 U/L (ref 38–126)
Anion gap: 3 — ABNORMAL LOW (ref 5–15)
BUN: 10 mg/dL (ref 6–20)
CO2: 30 mmol/L (ref 22–32)
Calcium: 9.3 mg/dL (ref 8.9–10.3)
Chloride: 104 mmol/L (ref 98–111)
Creatinine: 0.66 mg/dL (ref 0.44–1.00)
GFR, Estimated: >60 mL/min (ref >60)
Glucose, Bld: 95 mg/dL (ref 70–99)
Potassium: 4 mmol/L (ref 3.5–5.1)
Sodium: 137 mmol/L (ref 135–145)
Total Bilirubin: 0.3 mg/dL (ref 0.3–1.2)
Total Protein: 7.1 g/dL (ref 6.5–8.1)

## 2022-08-28 LAB — CBC WITH DIFFERENTIAL (CANCER CENTER ONLY)
Abs Immature Granulocytes: 0.02 10*3/uL (ref 0.00–0.07)
Basophils Absolute: 0 10*3/uL (ref 0.0–0.1)
Basophils Relative: 0 %
Eosinophils Absolute: 0.1 10*3/uL (ref 0.0–0.5)
Eosinophils Relative: 1 %
HCT: 32.3 % — ABNORMAL LOW (ref 36.0–46.0)
Hemoglobin: 10.7 g/dL — ABNORMAL LOW (ref 12.0–15.0)
Immature Granulocytes: 0 %
Lymphocytes Relative: 25 %
Lymphs Abs: 2.1 10*3/uL (ref 0.7–4.0)
MCH: 26.2 pg (ref 26.0–34.0)
MCHC: 33.1 g/dL (ref 30.0–36.0)
MCV: 79 fL — ABNORMAL LOW (ref 80.0–100.0)
Monocytes Absolute: 0.5 10*3/uL (ref 0.1–1.0)
Monocytes Relative: 6 %
Neutro Abs: 5.6 10*3/uL (ref 1.7–7.7)
Neutrophils Relative %: 68 %
Platelet Count: 250 10*3/uL (ref 150–400)
RBC: 4.09 MIL/uL (ref 3.87–5.11)
RDW: 13.2 % (ref 11.5–15.5)
WBC Count: 8.3 10*3/uL (ref 4.0–10.5)
nRBC: 0 % (ref 0.0–0.2)

## 2022-08-28 LAB — IRON AND IRON BINDING CAPACITY (CC-WL,HP ONLY)
Iron: 41 ug/dL (ref 28–170)
Saturation Ratios: 14 % (ref 10.4–31.8)
TIBC: 291 ug/dL (ref 250–450)
UIBC: 250 ug/dL (ref 148–442)

## 2022-08-28 LAB — VITAMIN B12: Vitamin B-12: 2200 pg/mL — ABNORMAL HIGH (ref 180–914)

## 2022-08-28 LAB — FERRITIN: Ferritin: 130 ng/mL (ref 11–307)

## 2022-08-28 NOTE — Progress Notes (Signed)
HEMATOLOGY/ONCOLOGY CLINIC NOTE  Date of Service: 08/28/22   Patient Care Team: [name], FNP as PCP - General (General Practice) Chilton Si, MD as PCP - Cardiology (Cardiology) Drema Dallas, DO as Consulting Physician (Neurology)  CHIEF COMPLAINTS/PURPOSE OF CONSULTATION:  Patient has been referred to [place] for continued valuation and management of iron deficiency anemia  HISTORY OF PRESENTING ILLNESS:   [name] is a wonderful 47 y.o. female who has been referred to [place] by Dr. Adela Lank for evaluation and management of iron deficiency anemia. The pt reports that she is doing well overall.   The pt reports that she has been anemic for as long as she can remember. Dr. Adela Lank completed an Upper Endoscopy earlier this year to work up the cause for her anemia. No cause has been found yet. Pt also had a Colonoscopy in 2018 and a Capsule Endoscopy in 2019. She denies significant hemorrhoidal bleeding. Pt stopped having menstrual cycles in 2005 after having a partial hysterectomy for Endometriosis. She does not take much OTC pain medication as it upsets her stomach. Pt is currently taking high-dose Vitamin D twice per week and Calcium daily. She does not have thorough knowledge of her paternal family history, but is not aware of any family history of blood disorders or cancers.   Pt is having pain around her ear, right-sided jaw pain, dry mouth, dry eyes & burning sensation mainly in her right eye. She has tried eye lubricants and steroidal eye drops to no avail. Pt has had these symptoms evaluated by ENT & Optometrist, who found no cause. These symptoms have been intermittent for the last month. Pt also reports recent increase in urinary urgency, SOB, and new heart palpitations. She has had difficulty swallowing since 2018, which has not improved since dilation was performed during the latest Endoscopy. Pt was recently on Reclast but has been off for a few months. Pt has  been using OTC Ferrous Sulfate and has experienced constipation.    Most recent lab results (02/16/2020) of CBC is as follows: all values are WNL except for Hgb at 10.7, HCT at 33.5, MCV at 75.6, RDW at 15.7. 02/16/2020 IBC + Ferritin is as follows: Iron at 58, TIBC at 257.0, Sat Ratios at 16.1, Ferritin at 53.5.  On review of systems, pt reports tingling/numbness in hands/feet, discomfort in mastoid area, jaw pain, dry mouth, dry eyes, ankle pain, urinary urgency, SOB, palpitations, constipation and denies joint swelling, rash, mouth sores, abdominal pain, abnormal/excessive bleeding and any other symptoms.   On PMHx the pt reports Endometriosis, Vitamin D deficiency, Osteoporosis, Inappropriate sinus tachycardia, Back pain, Anemia, Partial hysterectomy.  Interval History  [name] is a 47 y.o. female here for continued evaluation and management of iron deficiency anemia. Patient was last seen by me on 02/04/2022 and was doing well overall. Patient did receive gastric bypass surgery in 03/19/2022. She presented to the ED on 08/15/2022 with acute upper respiratory infection.   Today, she reports that her recent gastric bypass surgery was successful. She reports that she has lost about 30 pounds so far and currently weighs 200 pounds. She is working towards optimizing her nutrition. She reports regularly consuming protein in her diet and does aim to consume adequate levels of water daily. She does regularly take supplements containing B vitamins and iron.  Patient does complain of increased fatigue. Her fatigue does not limit her daily activities and she does sometimes go for walks.   Patient denies any  black stools, blood in stools, abdominal pain, or leg swelling. She reports that she does not experience any menstrual cycles at this time and has no other source of blood loss other than her recent surgery.  MEDICAL HISTORY: Past Medical History:  Diagnosis Date   AKI (acute kidney  injury) (HCC) 10/06/2020   Allergy    Anemia    Angio-edema    Anxiety    Apnea 12/08/2019   Arthritis    Asthma    Back pain    Constipation    Exertional dyspnea 03/14/2020   Fatty liver    GERD (gastroesophageal reflux disease)    H/O: hysterectomy    Hx of endometriosis    Inappropriate sinus tachycardia 12/08/2019   Insomnia    Joint pain    Lactose intolerance    Macromastia 07/2011   Migraine    Morbid obesity (HCC) 12/08/2019   Osteoporosis    Urticaria    Vitamin D deficiency     SURGICAL HISTORY: Past Surgical History:  Procedure Laterality Date   30 HOUR PH STUDY N/A 01/12/2018   Procedure: 24 HOUR PH STUDY;  Surgeon: Napoleon Form, MD;  Location: [facility];  Service: Endoscopy;  Laterality: N/A;   ABDOMINAL HYSTERECTOMY  12/12/2003   ADENOIDECTOMY  05/2011   ANKLE ARTHROSCOPY  12/29/2007   right; with extensive debridement   BLADDER NECK RECONSTRUCTION  01/14/2011   Procedure: BLADDER NECK REPAIR;  Surgeon: Reva Bores, MD;  Location: [facility];  Service: Gynecology;  Laterality: N/A;  Laparoscopic Repair of Incidental Cystotomy   BREAST REDUCTION SURGERY  08/05/2011   Procedure: MAMMARY REDUCTION  (BREAST);  Surgeon: Louisa Second, MD;  Location: [facility];  Service: Plastics;  Laterality: Bilateral;  bilateral   BREAST SURGERY  2012   breast reduction   CESAREAN SECTION  12/29/2000; 1994   DILATION AND CURETTAGE OF UTERUS  07/08/2003   open laparoscopy   ESOPHAGEAL MANOMETRY N/A 01/12/2018   Procedure: ESOPHAGEAL MANOMETRY (EM);  Surgeon: Napoleon Form, MD;  Location: [facility];  Service: Endoscopy;  Laterality: N/A;   GIVENS CAPSULE STUDY N/A 06/23/2017   Procedure: GIVENS CAPSULE STUDY;  Surgeon: Benancio Deeds, MD;  Location: [facility];  Service: Gastroenterology;  Laterality: N/A;   LAPAROSCOPIC SALPINGOOPHERECTOMY  01/14/2011   left   PH IMPEDANCE STUDY N/A 01/12/2018   Procedure: PH IMPEDANCE STUDY;  Surgeon: Napoleon Form, MD;  Location: [facility];  Service: Endoscopy;  Laterality: N/A;   REPAIR PERONEAL TENDONS ANKLE  01/03/2010   repair right subluxing peroneal tendons   RESECTION DISTAL CLAVICAL Right 09/25/2017   Procedure: RESECTION DISTAL CLAVICAL;  Surgeon: Bjorn Pippin, MD;  Location: [facility];  Service: Orthopedics;  Laterality: Right;   RIGHT OOPHORECTOMY     with lysis of adhesions   SHOULDER ACROMIOPLASTY Right 09/25/2017   Procedure: SHOULDER ACROMIOPLASTY;  Surgeon: Bjorn Pippin, MD;  Location: [facility];  Service: Orthopedics;  Laterality: Right;   SHOULDER ARTHROSCOPY WITH DEBRIDEMENT AND BICEP TENDON REPAIR Right 09/25/2017   Procedure: SHOULDER ARTHROSCOPY WITH DEBRIDEMENT AND BICEP TENDON REPAIR;  Surgeon: Bjorn Pippin, MD;  Location: [facility];  Service: Orthopedics;  Laterality: Right;   SHOULDER ARTHROSCOPY WITH ROTATOR CUFF REPAIR Right 09/25/2017   Procedure: SHOULDER ARTHROSCOPY WITH ROTATOR CUFF REPAIR;  Surgeon: Bjorn Pippin, MD;  Location:  SURGERY CENTER;  Service: Orthopedics;  Laterality: Right;   TONSILLECTOMY  as a child  TUBAL LIGATION  12/29/2000   UPPER GASTROINTESTINAL ENDOSCOPY      SOCIAL HISTORY: Social History   Socioeconomic History   Marital status: Divorced    Spouse name: Not on file   Number of children: 2   Years of education: 12th   Highest education level: Not on file  Occupational History   Occupation: Facilities manager: OTHER    Comment: [employer]  Tobacco Use   Smoking status: Never   Smokeless tobacco: Never  Vaping Use   Vaping Use: Never used  Substance and Sexual Activity   Alcohol use: Yes    Comment: occasional    Drug use: No   Sexual activity: Not on file  Other Topics Concern   Not on file  Social History Narrative   Pt lives in 2 story town home with her son   Has 2 children   High school Paramedic for [employer]   Right handed   Social  Determinants of Health   Financial Resource Strain: Not on file  Food Insecurity: No Food Insecurity (12/27/2020)   Hunger Vital Sign    Worried About Running Out of Food in the Last Year: Never true    Ran Out of Food in the Last Year: Never true  Transportation Needs: No Transportation Needs (12/27/2020)   PRAPARE - Administrator, Civil Service (Medical): No    Lack of Transportation (Non-Medical): No  Physical Activity: Not on file  Stress: Not on file  Social Connections: Not on file  Intimate Partner Violence: Not on file    FAMILY HISTORY: Family History  Problem Relation Age of Onset   Diabetes Mother    Hypertension Mother    Hyperlipidemia Mother    Sleep apnea Mother    Thyroid disease Sister    Dementia Father    Heart attack Maternal Grandmother    Stroke Maternal Grandfather    Asthma Son    Healthy Son    Asthma Daughter    Healthy Daughter    Colon cancer Neg Hx    Colon polyps Neg Hx    Esophageal cancer Neg Hx    Rectal cancer Neg Hx    Stomach cancer Neg Hx     ALLERGIES:  is allergic to aspirin, ibuprofen, salmon [fish allergy], and zoledronic acid.  MEDICATIONS:  Current Outpatient Medications  Medication Sig Dispense Refill   acetaminophen (TYLENOL) 500 MG tablet Take 1,000 mg by mouth every 6 (six) hours as needed for moderate pain.     albuterol (VENTOLIN HFA) 108 (90 Base) MCG/ACT inhaler TAKE 2 PUFFS BY MOUTH EVERY 6 HOURS AS NEEDED FOR WHEEZE OR SHORTNESS OF BREATH (Patient taking differently: Inhale 2 puffs into the lungs every 6 (six) hours as needed for wheezing or shortness of breath.) 6.7 each 11   calcium carbonate (OS-CAL - DOSED IN MG OF ELEMENTAL CALCIUM) 1250 (500 Ca) MG tablet Take 1 tablet by mouth.     cetirizine (ZYRTEC) 10 MG tablet TAKE 1 TABLET (10 MG TOTAL) BY MOUTH DAILY AS NEEDED FOR RHINITIS. 90 tablet 1   clindamycin (CLEOCIN T) 1 % lotion Apply 1 application topically daily.     EPINEPHrine (EPIPEN 2-PAK) 0.3  mg/0.3 mL IJ SOAJ injection Inject 0.3 mg into the muscle as needed for anaphylaxis. 2 each 2   esomeprazole (NEXIUM) 40 MG capsule Take 1 capsule (40 mg total) by mouth 2 (two) times daily before a meal. Please  schedule a follow up appointment for further refills. 60 capsule 1   famotidine (PEPCID) 20 MG tablet Take 20 mg by mouth daily.     fluticasone furoate-vilanterol (BREO ELLIPTA) 100-25 MCG/INH AEPB Inhale 1 puff into the lungs daily as needed. (Patient taking differently: Inhale 1 puff into the lungs daily as needed (shortness of breath).) 60 each 11   Multiple Vitamin (MULTIVITAMIN WITH MINERALS) TABS tablet Take 1 tablet by mouth daily.     ondansetron (ZOFRAN) 4 MG tablet Take 1 tablet (4 mg total) by mouth daily as needed for nausea or vomiting. 30 tablet 0   RESTASIS 0.05 % ophthalmic emulsion Place 1 drop into both eyes 2 (two) times daily as needed (dryness).     tiZANidine (ZANAFLEX) 4 MG tablet Take 4 mg by mouth every 6 (six) hours as needed for muscle spasms.     tretinoin (RETIN-A) 0.025 % cream Apply 1 application  topically at bedtime.     Vitamin D, Ergocalciferol, (DRISDOL) 1.25 MG (50000 UNIT) CAPS capsule TAKE 1 CAPSULE (50,000 UNITS TOTAL) BY MOUTH EVERY 7 (SEVEN) DAYS 13 capsule 4   No current facility-administered medications for this visit.    REVIEW OF SYSTEMS:    10 Point review of Systems was done is negative except as noted above.   PHYSICAL EXAMINATION:   .BP 106/68   Pulse 66   Temp 98.6 F (37 C)   Resp 20   Wt 200 lb (90.7 kg)   LMP 12/27/2003   SpO2 100%   BMI 34.33 kg/m  GENERAL:alert, in no acute distress and comfortable SKIN: no acute rashes, no significant lesions EYES: conjunctiva are pink and non-injected, sclera anicteric OROPHARYNX: MMM, no exudates, no oropharyngeal erythema or ulceration NECK: supple, no JVD LYMPH:  no palpable lymphadenopathy in the cervical, axillary or inguinal regions LUNGS: clear to auscultation b/l with  normal respiratory effort HEART: regular rate & rhythm ABDOMEN:  normoactive bowel sounds , non tender, not distended. Extremity: no pedal edema PSYCH: alert & oriented x 3 with fluent speech NEURO: no focal motor/sensory deficits   LABORATORY DATA:  I have reviewed the data as listed  .    Latest Ref Rng & Units 08/28/2022   12:39 PM 05/27/2022   10:36 PM 05/07/2022    4:30 PM  CBC  WBC 4.0 - 10.5 K/uL 8.3  6.1  3.8   Hemoglobin 12.0 - 15.0 g/dL 16.1  09.6  04.5   Hematocrit 36.0 - 46.0 % 32.3  35.4  31.6   Platelets 150 - 400 K/uL 250  305  255    . CBC    Component Value Date/Time   WBC 8.3 08/28/2022 1239   WBC 6.1 05/27/2022 2236   RBC 4.09 08/28/2022 1239   HGB 10.7 (L) 08/28/2022 1239   HGB 10.4 (L) 05/07/2022 1630   HCT 32.3 (L) 08/28/2022 1239   HCT 31.6 (L) 05/07/2022 1630   PLT 250 08/28/2022 1239   PLT 255 05/07/2022 1630   MCV 79.0 (L) 08/28/2022 1239   MCV 79 05/07/2022 1630   MCH 26.2 08/28/2022 1239   MCHC 33.1 08/28/2022 1239   RDW 13.2 08/28/2022 1239   RDW 13.5 05/07/2022 1630   LYMPHSABS 2.1 08/28/2022 1239   LYMPHSABS 1.9 01/04/2020 1046   MONOABS 0.5 08/28/2022 1239   EOSABS 0.1 08/28/2022 1239   EOSABS 0.1 01/04/2020 1046   BASOSABS 0.0 08/28/2022 1239   BASOSABS 0.0 01/04/2020 1046    .    Latest  Ref Rng & Units 08/28/2022   12:39 PM 05/27/2022   10:36 PM 05/07/2022    4:30 PM  CMP  Glucose 70 - 99 mg/dL 95  960  82   BUN 6 - 20 mg/dL 10  20  12    Creatinine 0.44 - 1.00 mg/dL 4.54  0.98  1.19   Sodium 135 - 145 mmol/L 137  136  141   Potassium 3.5 - 5.1 mmol/L 4.0  3.8  4.0   Chloride 98 - 111 mmol/L 104  102  103   CO2 22 - 32 mmol/L 30  26  24    Calcium 8.9 - 10.3 mg/dL 9.3  9.7  9.1   Total Protein 6.5 - 8.1 g/dL 7.1  8.1    Total Bilirubin 0.3 - 1.2 mg/dL 0.3  0.3    Alkaline Phos 38 - 126 U/L 65  61    AST 15 - 41 U/L 32  31    ALT 0 - 44 U/L 50  43     . Lab Results  Component Value Date   IRON 41 08/28/2022   TIBC 291  08/28/2022   IRONPCTSAT 14 08/28/2022   (Iron and TIBC)  Lab Results  Component Value Date   FERRITIN 130 08/28/2022   Alpha-Thalassemia GenotypR Order: [id number] Status: Final result   Visible to patient: Yes (seen)   Next appt: 06/19/2020 at 10:40 AM in Family Medicine Gavin Pound Opalski, DO)   Dx: Microcytic anemia   0 Result Notes  Component 3 mo ago  Alpha-Thalassemia Comment:   Comment: (NOTE)  Test: Alpha-Thalassemia, DNA Analysis  Result:      Alpha-+-thalassemia trait, alpha alpha/alpha-  Mutation(s) identified: alpha3.7  Interpretation:  This result is most consistent with this individual being an  unaffected carrier of alpha-thalassemia with a single gene  deletion, also called alpha-+-thalassemia trait.       RADIOGRAPHIC STUDIES: I have personally reviewed the radiological images as listed and agreed with the findings in the report. DG Knee 1-2 Views Right  Result Date: 01/18/2022 CLINICAL DATA:  Osteoarthritis of right knee. Increasing right anterolateral knee pain for 2 years. EXAM: RIGHT KNEE - 1-2 VIEW COMPARISON:  None Available. FINDINGS: Normal bone mineralization. Mild patellofemoral joint space narrowing and mild superior and inferior patellar degenerative osteophytosis. Mild chronic enthesopathic change at the quadriceps insertion on the patella. Minimal proximal patellar enthesopathic change at the inferior patella. No joint effusion. No acute fracture or dislocation. IMPRESSION: Very mild patellofemoral osteoarthritis. Electronically Signed   By: Neita Garnet M.D.   On: 01/18/2022 10:00    ASSESSMENT & PLAN:   47 yo female with   1) Microcytic Anemia due to iron deficiency 2) Alpha thalassemia trait PLAN:  -Patient received gastric bypass 03/19/2022 -Discussed lab results on 08/28/2022 in detail with patient. CBC showed WBC of 8.3K, hemoglobin of 10.7, and platelets of 250K. -mild microcytic anemia likely related to Alpha-+-thalassemia  trait -B12 wnl -ferritin 130 (>100) -no Indication for additional IV Iron at this time. -continue po iron supplementation per bariatric medicine team -informed patient that deficiencies may appear 1-2 years after gastric bypass surgery -continue to work with dietitian to optimize nutritional diet  -continue B vitamin and iron supplements -Recommend a regular exercise routine to improve fatigue -Recommend patient to consume at least 2L of water daily  FOLLOW-UP: Labs with PCP to monitor blood counts and iron labs in 6 months RTC with Dr Candise Che with labs in 12 months  The total time spent  in the appointment was 21 minutes* .  All of the patient's questions were answered with apparent satisfaction. The patient knows to call the clinic with any problems, questions or concerns.   Wyvonnia Lora MD MS AAHIVMS [facility] Hematology/Oncology Physician [facility]  .*Total Encounter Time as defined by the Centers for Medicare and Medicaid Services includes, in addition to the face-to-face time of a patient visit (documented in the note above) non-face-to-face time: obtaining and reviewing outside history, ordering and reviewing medications, tests or procedures, care coordination (communications with other health care professionals or caregivers) and documentation in the medical record.    I,Mitra Faeizi,acting as a Neurosurgeon for Wyvonnia Lora, MD.,have documented all relevant documentation on the behalf of Wyvonnia Lora, MD,as directed by  Wyvonnia Lora, MD while in the presence of Wyvonnia Lora, MD.  .I have reviewed the above documentation for accuracy and completeness, and I agree with the above. Johney Maine MD

## 2022-09-03 ENCOUNTER — Encounter: Payer: Self-pay | Admitting: Hematology

## 2022-09-03 DIAGNOSIS — R202 Paresthesia of skin: Secondary | ICD-10-CM | POA: Diagnosis not present

## 2022-09-03 DIAGNOSIS — M5412 Radiculopathy, cervical region: Secondary | ICD-10-CM | POA: Diagnosis not present

## 2022-09-03 DIAGNOSIS — G8929 Other chronic pain: Secondary | ICD-10-CM | POA: Diagnosis not present

## 2022-09-03 DIAGNOSIS — M545 Low back pain, unspecified: Secondary | ICD-10-CM | POA: Diagnosis not present

## 2022-09-03 DIAGNOSIS — G894 Chronic pain syndrome: Secondary | ICD-10-CM | POA: Diagnosis not present

## 2022-09-03 DIAGNOSIS — M542 Cervicalgia: Secondary | ICD-10-CM | POA: Diagnosis not present

## 2022-09-03 DIAGNOSIS — Z79891 Long term (current) use of opiate analgesic: Secondary | ICD-10-CM | POA: Diagnosis not present

## 2022-09-06 ENCOUNTER — Other Ambulatory Visit (HOSPITAL_COMMUNITY): Payer: Self-pay | Admitting: General Surgery

## 2022-09-06 DIAGNOSIS — E6609 Other obesity due to excess calories: Secondary | ICD-10-CM | POA: Diagnosis not present

## 2022-09-06 DIAGNOSIS — R1011 Right upper quadrant pain: Secondary | ICD-10-CM

## 2022-09-06 DIAGNOSIS — R110 Nausea: Secondary | ICD-10-CM | POA: Diagnosis not present

## 2022-09-06 DIAGNOSIS — M2420 Disorder of ligament, unspecified site: Secondary | ICD-10-CM | POA: Diagnosis not present

## 2022-09-06 DIAGNOSIS — R7303 Prediabetes: Secondary | ICD-10-CM | POA: Diagnosis not present

## 2022-09-06 DIAGNOSIS — Z9884 Bariatric surgery status: Secondary | ICD-10-CM | POA: Diagnosis not present

## 2022-09-06 DIAGNOSIS — E7800 Pure hypercholesterolemia, unspecified: Secondary | ICD-10-CM | POA: Diagnosis not present

## 2022-09-06 DIAGNOSIS — Z862 Personal history of diseases of the blood and blood-forming organs and certain disorders involving the immune mechanism: Secondary | ICD-10-CM | POA: Diagnosis not present

## 2022-09-09 ENCOUNTER — Telehealth: Payer: Self-pay

## 2022-09-09 DIAGNOSIS — F411 Generalized anxiety disorder: Secondary | ICD-10-CM | POA: Diagnosis not present

## 2022-09-09 DIAGNOSIS — F330 Major depressive disorder, recurrent, mild: Secondary | ICD-10-CM | POA: Diagnosis not present

## 2022-09-09 NOTE — Telephone Encounter (Signed)
Opened in error

## 2022-09-10 DIAGNOSIS — F330 Major depressive disorder, recurrent, mild: Secondary | ICD-10-CM | POA: Diagnosis not present

## 2022-09-10 DIAGNOSIS — F411 Generalized anxiety disorder: Secondary | ICD-10-CM | POA: Diagnosis not present

## 2022-09-20 ENCOUNTER — Encounter (HOSPITAL_BASED_OUTPATIENT_CLINIC_OR_DEPARTMENT_OTHER): Payer: Self-pay

## 2022-09-20 ENCOUNTER — Ambulatory Visit (HOSPITAL_BASED_OUTPATIENT_CLINIC_OR_DEPARTMENT_OTHER)
Admission: RE | Admit: 2022-09-20 | Discharge: 2022-09-20 | Disposition: A | Discharge status: Home / Self Care | Payer: Medicaid Other | Source: Ambulatory Visit | Attending: General Surgery | Admitting: General Surgery

## 2022-09-20 DIAGNOSIS — R1011 Right upper quadrant pain: Secondary | ICD-10-CM

## 2022-09-20 DIAGNOSIS — K7689 Other specified diseases of liver: Secondary | ICD-10-CM | POA: Diagnosis not present

## 2022-09-20 DIAGNOSIS — K449 Diaphragmatic hernia without obstruction or gangrene: Secondary | ICD-10-CM | POA: Diagnosis not present

## 2022-09-20 MED ORDER — IOHEXOL 300 MG/ML  SOLN
75.0000 mL | Freq: Once | INTRAMUSCULAR | Status: AC | PRN
  Administered 2022-09-20: 75 mL via INTRAVENOUS

## 2022-09-24 DIAGNOSIS — F330 Major depressive disorder, recurrent, mild: Secondary | ICD-10-CM | POA: Diagnosis not present

## 2022-09-24 DIAGNOSIS — F411 Generalized anxiety disorder: Secondary | ICD-10-CM | POA: Diagnosis not present

## 2022-10-01 DIAGNOSIS — G894 Chronic pain syndrome: Secondary | ICD-10-CM | POA: Diagnosis not present

## 2022-10-01 DIAGNOSIS — M5412 Radiculopathy, cervical region: Secondary | ICD-10-CM | POA: Diagnosis not present

## 2022-10-01 DIAGNOSIS — M542 Cervicalgia: Secondary | ICD-10-CM | POA: Diagnosis not present

## 2022-10-01 DIAGNOSIS — R202 Paresthesia of skin: Secondary | ICD-10-CM | POA: Diagnosis not present

## 2022-10-01 DIAGNOSIS — Z79891 Long term (current) use of opiate analgesic: Secondary | ICD-10-CM | POA: Diagnosis not present

## 2022-10-01 DIAGNOSIS — G8929 Other chronic pain: Secondary | ICD-10-CM | POA: Diagnosis not present

## 2022-10-01 DIAGNOSIS — M545 Low back pain, unspecified: Secondary | ICD-10-CM | POA: Diagnosis not present

## 2022-10-04 ENCOUNTER — Emergency Department (HOSPITAL_COMMUNITY): Payer: Medicaid Other

## 2022-10-04 ENCOUNTER — Other Ambulatory Visit: Payer: Self-pay

## 2022-10-04 ENCOUNTER — Emergency Department (HOSPITAL_COMMUNITY)
Admission: EM | Admit: 2022-10-04 | Discharge: 2022-10-05 | Disposition: A | Discharge status: Home / Self Care | Payer: Medicaid Other | Attending: Emergency Medicine | Admitting: Emergency Medicine

## 2022-10-04 ENCOUNTER — Encounter (HOSPITAL_COMMUNITY): Payer: Self-pay

## 2022-10-04 DIAGNOSIS — N390 Urinary tract infection, site not specified: Secondary | ICD-10-CM | POA: Insufficient documentation

## 2022-10-04 DIAGNOSIS — B9689 Other specified bacterial agents as the cause of diseases classified elsewhere: Secondary | ICD-10-CM | POA: Diagnosis not present

## 2022-10-04 DIAGNOSIS — K5900 Constipation, unspecified: Secondary | ICD-10-CM

## 2022-10-04 DIAGNOSIS — R1084 Generalized abdominal pain: Secondary | ICD-10-CM

## 2022-10-04 DIAGNOSIS — N3289 Other specified disorders of bladder: Secondary | ICD-10-CM | POA: Diagnosis not present

## 2022-10-04 LAB — CBC WITH DIFFERENTIAL/PLATELET
Abs Immature Granulocytes: 0.01 10*3/uL (ref 0.00–0.07)
Basophils Absolute: 0 10*3/uL (ref 0.0–0.1)
Basophils Relative: 1 %
Eosinophils Absolute: 0.1 10*3/uL (ref 0.0–0.5)
Eosinophils Relative: 1 %
HCT: 32.5 % — ABNORMAL LOW (ref 36.0–46.0)
Hemoglobin: 10.1 g/dL — ABNORMAL LOW (ref 12.0–15.0)
Immature Granulocytes: 0 %
Lymphocytes Relative: 37 %
Lymphs Abs: 2.3 10*3/uL (ref 0.7–4.0)
MCH: 25.4 pg — ABNORMAL LOW (ref 26.0–34.0)
MCHC: 31.1 g/dL (ref 30.0–36.0)
MCV: 81.9 fL (ref 80.0–100.0)
Monocytes Absolute: 0.4 10*3/uL (ref 0.1–1.0)
Monocytes Relative: 7 %
Neutro Abs: 3.5 10*3/uL (ref 1.7–7.7)
Neutrophils Relative %: 54 %
Platelets: 251 10*3/uL (ref 150–400)
RBC: 3.97 MIL/uL (ref 3.87–5.11)
RDW: 13.5 % (ref 11.5–15.5)
WBC: 6.4 10*3/uL (ref 4.0–10.5)
nRBC: 0 % (ref 0.0–0.2)

## 2022-10-04 LAB — COMPREHENSIVE METABOLIC PANEL
ALT: 35 U/L (ref 0–44)
AST: 26 U/L (ref 15–41)
Albumin: 4.1 g/dL (ref 3.5–5.0)
Alkaline Phosphatase: 49 U/L (ref 38–126)
Anion gap: 9 (ref 5–15)
BUN: 17 mg/dL (ref 6–20)
CO2: 26 mmol/L (ref 22–32)
Calcium: 9 mg/dL (ref 8.9–10.3)
Chloride: 101 mmol/L (ref 98–111)
Creatinine, Ser: 0.7 mg/dL (ref 0.44–1.00)
GFR, Estimated: >60 mL/min (ref >60)
Glucose, Bld: 94 mg/dL (ref 70–99)
Potassium: 3.9 mmol/L (ref 3.5–5.1)
Sodium: 136 mmol/L (ref 135–145)
Total Bilirubin: 0.5 mg/dL (ref 0.3–1.2)
Total Protein: 7.5 g/dL (ref 6.5–8.1)

## 2022-10-04 LAB — LIPASE, BLOOD: Lipase: 27 U/L (ref 11–51)

## 2022-10-04 MED ORDER — IOHEXOL 300 MG/ML  SOLN
100.0000 mL | Freq: Once | INTRAMUSCULAR | Status: AC | PRN
  Administered 2022-10-04: 100 mL via INTRAVENOUS

## 2022-10-04 MED ORDER — IOHEXOL 9 MG/ML PO SOLN: 500.0000 mL | ORAL | Status: DC

## 2022-10-04 NOTE — ED Provider Notes (Signed)
[place] EMERGENCY DEPARTMENT AT [facility] Provider Note   CSN: [id number] Arrival date & time: 10/04/22  2124     History {Add pertinent medical, surgical, social history, OB history to HPI:1} Chief Complaint  Patient presents with   Abdominal Pain    [name] is a 47 y.o. female.  47 year old female presents with complaint of generalized abdominal cramping and nausea since yesterday, worse today, not improving with gas-x. Last bowel movement was yesterday. Prior gastric by-pass surgery, also abdominal hysterectomy, tubal ligation, laparoscopic salpingoopherectomy, right oophorectomy.  Went to her surgeon 09/20/22 for same, not improving.        Home Medications Prior to Admission medications   Medication Sig Start Date End Date Taking? Authorizing Provider  acetaminophen (TYLENOL) 325 MG tablet Take 2 tablets (650 mg total) by mouth every 6 (six) hours as needed. 04/16/22   Rising, Lurena Joiner, PA-C  albuterol (VENTOLIN HFA) 108 (90 Base) MCG/ACT inhaler Inhale 1-2 puffs into the lungs every 6 (six) hours as needed for wheezing or shortness of breath. 08/15/22   Gustavus Bryant, FNP  amoxicillin-clavulanate (AUGMENTIN) 875-125 MG tablet Take 1 tablet by mouth every 12 (twelve) hours. 08/15/22   Gustavus Bryant, FNP  b complex vitamins capsule Take 1 capsule by mouth daily.    [provider]  benzonatate (TESSALON) 100 MG capsule Take 1 capsule (100 mg total) by mouth every 8 (eight) hours as needed for cough. 08/15/22   Gustavus Bryant, FNP  calcium carbonate (OS-CAL - DOSED IN MG OF ELEMENTAL CALCIUM) 1250 (500 Ca) MG tablet Take 1 tablet by mouth daily with breakfast.    [provider]  cetirizine (ZYRTEC) 10 MG tablet TAKE 1 TABLET (10 MG TOTAL) BY MOUTH DAILY AS NEEDED FOR RHINITIS. Patient taking differently: Take 10 mg by mouth daily as needed for rhinitis or allergies. 06/25/21   Verlee Monte, MD  clonazePAM (KLONOPIN) 1 MG tablet Take  0.5-1 mg by mouth daily as needed (sleep). 01/01/22   [provider]  EPINEPHrine (EPIPEN 2-PAK) 0.3 mg/0.3 mL IJ SOAJ injection Inject 0.3 mg into the muscle as needed for anaphylaxis. 03/21/21   Verlee Monte, MD  esomeprazole (NEXIUM) 40 MG capsule Take 1 capsule (40 mg total) by mouth 2 (two) times daily before a meal. Please schedule a follow up appointment for further refills. 06/19/22   Armbruster, Willaim Rayas, MD  fluticasone furoate-vilanterol (BREO ELLIPTA) 100-25 MCG/INH AEPB Inhale 1 puff into the lungs daily as needed. Patient taking differently: Inhale 1 puff into the lungs daily as needed (shortness of breath). 12/18/20   Glenford Bayley, NP  Multiple Vitamin (MULTIVITAMIN WITH MINERALS) TABS tablet Take 1 tablet by mouth daily.    [provider]  ondansetron (ZOFRAN) 4 MG tablet Take 1 tablet (4 mg total) by mouth every 6 (six) hours. 04/16/22   Rising, Rebecca, PA-C  RESTASIS 0.05 % ophthalmic emulsion Place 1 drop into both eyes 2 (two) times daily as needed (dryness). 11/04/19   [provider]  tiZANidine (ZANAFLEX) 4 MG tablet Take 4 mg by mouth every 6 (six) hours as needed for muscle spasms. 11/16/21   [provider]      Allergies    Aspirin, Ibuprofen, Salmon [fish allergy], and Zoledronic acid    Review of Systems   Review of Systems Negative except as per HPI Physical Exam Updated Vital Signs BP 118/77 (BP Location: Left Arm)   Pulse 63  Temp 98.7 F (37.1 C) (Oral)   Resp 17   Ht 5\' 4"  (1.626 m)   Wt 88.9 kg   LMP 12/27/2003   SpO2 100%   BMI 33.64 kg/m  Physical Exam Vitals and nursing note reviewed.  Constitutional:      General: She is not in acute distress.    Appearance: She is well-developed. She is not diaphoretic.  HENT:     Head: Normocephalic and atraumatic.  Cardiovascular:     Rate and Rhythm: Normal rate and regular rhythm.     Heart sounds: Normal heart sounds.  Pulmonary:     Effort: Pulmonary  effort is normal.     Breath sounds: Normal breath sounds.  Abdominal:     Palpations: Abdomen is soft.     Tenderness: There is abdominal tenderness. There is guarding.  Skin:    General: Skin is warm and dry.     Findings: No erythema or rash.  Neurological:     Mental Status: She is alert and oriented to person, place, and time.  Psychiatric:        Behavior: Behavior normal.     ED Results / Procedures / Treatments   Labs (all labs ordered are listed, but only abnormal results are displayed) Labs Reviewed  CBC WITH DIFFERENTIAL/PLATELET - Abnormal; Notable for the following components:      Result Value   Hemoglobin 10.1 (*)    HCT 32.5 (*)    MCH 25.4 (*)    All other components within normal limits  COMPREHENSIVE METABOLIC PANEL  LIPASE, BLOOD  URINALYSIS, ROUTINE W REFLEX MICROSCOPIC    EKG None  Radiology No results found.  Procedures Procedures  {Document cardiac monitor, telemetry assessment procedure when appropriate:1}  Medications Ordered in ED Medications  iohexol (OMNIPAQUE) 9 MG/ML oral solution 500 mL (has no administration in time range)  iohexol (OMNIPAQUE) 300 MG/ML solution 100 mL (100 mLs Intravenous Contrast Given 10/04/22 2318)    ED Course/ Medical Decision Making/ A&P   {   Click here for ABCD2, HEART and other calculatorsREFRESH Note before signing :1}                          Medical Decision Making Amount and/or Complexity of Data Reviewed Labs: ordered. Radiology: ordered.   This patient presents to the ED for concern of ***, this involves an extensive number of treatment options, and is a complaint that carries with it a high risk of complications and morbidity.  The differential diagnosis includes ***   Co morbidities that complicate the patient evaluation  ***   Additional history obtained:  Additional history obtained from *** External records from outside source obtained and reviewed including ***   Lab  Tests:  I Ordered, and personally interpreted labs.  The pertinent results include:  ***   Imaging Studies ordered:  I ordered imaging studies including ***  I independently visualized and interpreted imaging which showed *** I agree with the radiologist interpretation   Cardiac Monitoring: / EKG:  The patient was maintained on a cardiac monitor.  I personally viewed and interpreted the cardiac monitored which showed an underlying rhythm of: ***   Consultations Obtained:  I requested consultation with the ***,  and discussed lab and imaging findings as well as pertinent plan - they recommend: ***   Problem List / ED Course / Critical interventions / Medication management  *** I ordered medication including ***  for ***  Reevaluation of the patient after these medicines showed that the patient {resolved/improved/worsened:23923::"improved"} I have reviewed the patients home medicines and have made adjustments as needed   Social Determinants of Health:  ***   Test / Admission - Considered:  ***   {Document critical care time when appropriate:1} {Document review of labs and clinical decision tools ie heart score, Chads2Vasc2 etc:1}  {Document your independent review of radiology images, and any outside records:1} {Document your discussion with family members, caretakers, and with consultants:1} {Document social determinants of health affecting pt's care:1} {Document your decision making why or why not admission, treatments were needed:1} Final Clinical Impression(s) / ED Diagnoses Final diagnoses:  None    Rx / DC Orders ED Discharge Orders     None

## 2022-10-04 NOTE — ED Triage Notes (Signed)
Pt reports with mid and lower abdominal cramping since today. Pt reports drinking lots of fluids and taking gas x. Last bm yesterday.

## 2022-10-05 LAB — URINALYSIS, ROUTINE W REFLEX MICROSCOPIC
Bilirubin Urine: NEGATIVE
Glucose, UA: >=500 mg/dL — AB
Hgb urine dipstick: NEGATIVE
Ketones, ur: NEGATIVE mg/dL
Nitrite: NEGATIVE
Protein, ur: NEGATIVE mg/dL
Specific Gravity, Urine: 1.027 (ref 1.005–1.030)
pH: 6 (ref 5.0–8.0)

## 2022-10-05 LAB — URINALYSIS, MICROSCOPIC (REFLEX)

## 2022-10-05 MED ORDER — CEPHALEXIN 500 MG PO CAPS: 500.0000 mg | ORAL_CAPSULE | Freq: Two times a day (BID) | ORAL | 0 refills | Status: AC

## 2022-10-05 MED ORDER — CEPHALEXIN 500 MG PO CAPS
500.0000 mg | ORAL_CAPSULE | Freq: Once | ORAL | Status: AC
  Administered 2022-10-05: 500 mg via ORAL
  Filled 2022-10-05: qty 1

## 2022-10-05 NOTE — ED Notes (Signed)
Called lab, they said they will release UA shortly.

## 2022-10-05 NOTE — Discharge Instructions (Signed)
Home to manage constipation.  Take antibiotics as prescribed for urinary tract infection.  Recheck with your primary care provider if symptoms or not improving.  Return to ER for severe or concerning symptoms.

## 2022-10-08 ENCOUNTER — Telehealth: Payer: Self-pay

## 2022-10-08 DIAGNOSIS — F411 Generalized anxiety disorder: Secondary | ICD-10-CM | POA: Diagnosis not present

## 2022-10-08 DIAGNOSIS — F330 Major depressive disorder, recurrent, mild: Secondary | ICD-10-CM | POA: Diagnosis not present

## 2022-10-08 NOTE — Transitions of Care (Post Inpatient/ED Visit) (Signed)
   10/08/2022  Name: [name] MRN: [id number] DOB: 11/14/1975  Today's TOC FU Call Status: Today's TOC FU Call Status:: Unsuccessul Call (1st Attempt) Unsuccessful Call (1st Attempt) Date: 10/08/22  Attempted to reach the patient regarding the most recent Inpatient/ED visit.  Follow Up Plan: Additional outreach attempts will be made to reach the patient to complete the Transitions of Care (Post Inpatient/ED visit) call.   Signature YL,RMA

## 2022-10-10 ENCOUNTER — Other Ambulatory Visit: Payer: Self-pay | Admitting: Gastroenterology

## 2022-10-14 ENCOUNTER — Encounter (HOSPITAL_COMMUNITY): Payer: Self-pay

## 2022-10-15 ENCOUNTER — Ambulatory Visit: Payer: Self-pay | Admitting: General Surgery

## 2022-10-15 DIAGNOSIS — R1084 Generalized abdominal pain: Secondary | ICD-10-CM

## 2022-10-18 NOTE — Progress Notes (Signed)
COVID Vaccine Completed:  Date of COVID positive in last 90 days:  PCP - [name], MD Cardiologist - Chilton Si, MD  Chest x-ray - 08/15/22 Epic EKG - 12/10/21 epic Stress Test - 01/27/17 Epic ECHO - 04/17/20 Epic Cardiac Cath -  Pacemaker/ICD device last checked: Spinal Cord Stimulator:  Bowel Prep -   Sleep Study -  CPAP -   Fasting Blood Sugar - preDM Checks Blood Sugar _____ times a day  Last dose of GLP1 agonist-  N/A GLP1 instructions:  N/A   Last dose of SGLT-2 inhibitors-  N/A SGLT-2 instructions: N/A   Blood Thinner Instructions:  Time Aspirin Instructions: Last Dose:  Activity level:  Can go up a flight of stairs and perform activities of daily living without stopping and without symptoms of chest pain or shortness of breath.  Able to exercise without symptoms  Unable to go up a flight of stairs without symptoms of     Anesthesia review: fatty liver, tachycardia, SOB, eagles syndrome, anemia, CP  Patient denies shortness of breath, fever, cough and chest pain at PAT appointment  Patient verbalized understanding of instructions that were given to them at the PAT appointment. Patient was also instructed that they will need to review over the PAT instructions again at home before surgery.

## 2022-10-21 ENCOUNTER — Encounter (HOSPITAL_COMMUNITY)
Admission: RE | Admit: 2022-10-21 | Discharge: 2022-10-21 | Disposition: A | Discharge status: Home / Self Care | Payer: Medicaid Other | Source: Ambulatory Visit | Attending: General Surgery | Admitting: General Surgery

## 2022-10-21 ENCOUNTER — Other Ambulatory Visit: Payer: Self-pay

## 2022-10-21 ENCOUNTER — Encounter (HOSPITAL_COMMUNITY): Payer: Self-pay

## 2022-10-21 VITALS — BP 120/84 | HR 73 | Temp 97.9°F | Resp 16 | Ht 64.0 in | Wt 198.0 lb

## 2022-10-21 DIAGNOSIS — Z01812 Encounter for preprocedural laboratory examination: Secondary | ICD-10-CM | POA: Insufficient documentation

## 2022-10-21 DIAGNOSIS — K769 Liver disease, unspecified: Secondary | ICD-10-CM | POA: Diagnosis not present

## 2022-10-21 DIAGNOSIS — R1084 Generalized abdominal pain: Secondary | ICD-10-CM | POA: Insufficient documentation

## 2022-10-21 HISTORY — DX: Pneumonia, unspecified organism: J18.9

## 2022-10-21 HISTORY — DX: Prediabetes: R73.03

## 2022-10-21 LAB — CBC WITH DIFFERENTIAL/PLATELET
Abs Immature Granulocytes: 0 10*3/uL (ref 0.00–0.07)
Basophils Absolute: 0 10*3/uL (ref 0.0–0.1)
Basophils Relative: 1 %
Eosinophils Absolute: 0.1 10*3/uL (ref 0.0–0.5)
Eosinophils Relative: 2 %
HCT: 32.3 % — ABNORMAL LOW (ref 36.0–46.0)
Hemoglobin: 10 g/dL — ABNORMAL LOW (ref 12.0–15.0)
Immature Granulocytes: 0 %
Lymphocytes Relative: 44 %
Lymphs Abs: 2.4 10*3/uL (ref 0.7–4.0)
MCH: 25.5 pg — ABNORMAL LOW (ref 26.0–34.0)
MCHC: 31 g/dL (ref 30.0–36.0)
MCV: 82.4 fL (ref 80.0–100.0)
Monocytes Absolute: 0.6 10*3/uL (ref 0.1–1.0)
Monocytes Relative: 10 %
Neutro Abs: 2.3 10*3/uL (ref 1.7–7.7)
Neutrophils Relative %: 43 %
Platelets: 266 10*3/uL (ref 150–400)
RBC: 3.92 MIL/uL (ref 3.87–5.11)
RDW: 13.7 % (ref 11.5–15.5)
WBC: 5.4 10*3/uL (ref 4.0–10.5)
nRBC: 0 % (ref 0.0–0.2)

## 2022-10-21 LAB — COMPREHENSIVE METABOLIC PANEL
ALT: 39 U/L (ref 0–44)
AST: 27 U/L (ref 15–41)
Albumin: 3.9 g/dL (ref 3.5–5.0)
Alkaline Phosphatase: 53 U/L (ref 38–126)
Anion gap: 9 (ref 5–15)
BUN: 6 mg/dL (ref 6–20)
CO2: 27 mmol/L (ref 22–32)
Calcium: 8.9 mg/dL (ref 8.9–10.3)
Chloride: 102 mmol/L (ref 98–111)
Creatinine, Ser: 0.64 mg/dL (ref 0.44–1.00)
GFR, Estimated: >60 mL/min (ref >60)
Glucose, Bld: 93 mg/dL (ref 70–99)
Potassium: 3.9 mmol/L (ref 3.5–5.1)
Sodium: 138 mmol/L (ref 135–145)
Total Bilirubin: 0.4 mg/dL (ref 0.3–1.2)
Total Protein: 6.9 g/dL (ref 6.5–8.1)

## 2022-10-21 NOTE — Patient Instructions (Signed)
SURGICAL WAITING ROOM VISITATION  Patients having surgery or a procedure may have no more than 2 support people in the waiting area - these visitors may rotate.    Children under the age of 4 must have an adult with them who is not the patient.  Due to an increase in RSV and influenza rates and associated hospitalizations, children ages 58 and under may not visit patients in [facility] hospitals.  If the patient needs to stay at the hospital during part of their recovery, the visitor guidelines for inpatient rooms apply. Pre-op nurse will coordinate an appropriate time for 1 support person to accompany patient in pre-op.  This support person may not rotate.    Please refer to the [facility] website for the visitor guidelines for Inpatients (after your surgery is over and you are in a regular room).       Your procedure is scheduled on: 11/01/22   Report to [facility] Main Entrance    Report to admitting at 11:00 AM   Call this number if you have problems the morning of surgery [phone]   Do not eat food :After Midnight.   After Midnight you may have the following liquids until 10:15 AM DAY OF SURGERY  Water Non-Citrus Juices (without pulp, NO RED-Apple, White grape, White cranberry) Black Coffee (NO MILK/CREAM OR CREAMERS, sugar ok)  Clear Tea (NO MILK/CREAM OR CREAMERS, sugar ok) regular and decaf                             Plain Jell-O (NO RED)                                           Fruit ices (not with fruit pulp, NO RED)                                     Popsicles (NO RED)                                                               Sports drinks like Gatorade (NO RED           If you have questions, please contact your surgeon's office.   FOLLOW BOWEL PREP AND ANY ADDITIONAL PRE OP INSTRUCTIONS YOU RECEIVED FROM YOUR SURGEON'S OFFICE!!!     Oral Hygiene is also important to reduce your risk of infection.                                     Remember - BRUSH YOUR TEETH THE MORNING OF SURGERY WITH YOUR REGULAR TOOTHPASTE  DENTURES WILL BE REMOVED PRIOR TO SURGERY PLEASE DO NOT APPLY "Poly grip" OR ADHESIVES!!!   Take these medicines the morning of surgery with A SIP OF WATER: Tylenol, inhalers, Nexium, Norco, Zofran                              You  may not have any metal on your body including hair pins, jewelry, and body piercing             Do not wear make-up, lotions, powders, perfumes/cologne, or deodorant  Do not wear nail polish including gel and S&S, artificial/acrylic nails, or any other type of covering on natural nails including finger and toenails. If you have artificial nails, gel coating, etc. that needs to be removed by a nail salon please have this removed prior to surgery or surgery may need to be canceled/ delayed if the surgeon/ anesthesia feels like they are unable to be safely monitored.   Do not shave  48 hours prior to surgery.    Do not bring valuables to the hospital. [place] IS NOT             RESPONSIBLE   FOR VALUABLES.   Contacts, glasses, dentures or bridgework may not be worn into surgery.   Bring small overnight bag day of surgery.   DO NOT BRING YOUR HOME MEDICATIONS TO THE HOSPITAL. PHARMACY WILL DISPENSE MEDICATIONS LISTED ON YOUR MEDICATION LIST TO YOU DURING YOUR ADMISSION IN THE HOSPITAL!    Patients discharged on the day of surgery will not be allowed to drive home.  Someone NEEDS to stay with you for the first 24 hours after anesthesia.              Please read over the following fact sheets you were given: IF YOU HAVE QUESTIONS ABOUT YOUR PRE-OP INSTRUCTIONS PLEASE CALL [phone] [place]   If you received a COVID test during your pre-op visit  it is requested that you wear a mask when out in public, stay away from anyone that may not be feeling well and notify your surgeon if you develop symptoms. If you test positive for Covid or have been in contact with anyone that has  tested positive in the last 10 days please notify you surgeon.    [place] - Preparing for Surgery Before surgery, you can play an important role.  Because skin is not sterile, your skin needs to be as free of germs as possible.  You can reduce the number of germs on your skin by washing with CHG (chlorahexidine gluconate) soap before surgery.  CHG is an antiseptic cleaner which kills germs and bonds with the skin to continue killing germs even after washing. Please DO NOT use if you have an allergy to CHG or antibacterial soaps.  If your skin becomes reddened/irritated stop using the CHG and inform your nurse when you arrive at Short Stay. Do not shave (including legs and underarms) for at least 48 hours prior to the first CHG shower.  You may shave your face/neck.  Please follow these instructions carefully:  1.  Shower with CHG Soap the night before surgery and the  morning of surgery.  2.  If you choose to wash your hair, wash your hair first as usual with your normal  shampoo.  3.  After you shampoo, rinse your hair and body thoroughly to remove the shampoo.                             4.  Use CHG as you would any other liquid soap.  You can apply chg directly to the skin and wash.  Gently with a scrungie or clean washcloth.  5.  Apply the CHG Soap to your body ONLY FROM THE NECK DOWN.  Do   not use on face/ open                           Wound or open sores. Avoid contact with eyes, ears mouth and   genitals (private parts).                       Wash face,  Genitals (private parts) with your normal soap.             6.  Wash thoroughly, paying special attention to the area where your    surgery  will be performed.  7.  Thoroughly rinse your body with warm water from the neck down.  8.  DO NOT shower/wash with your normal soap after using and rinsing off the CHG Soap.                9.  Pat yourself dry with a clean towel.            10.  Wear clean pajamas.            11.  Place clean  sheets on your bed the night of your first shower and do not  sleep with pets. Day of Surgery : Do not apply any lotions/deodorants the morning of surgery.  Please wear clean clothes to the hospital/surgery center.  FAILURE TO FOLLOW THESE INSTRUCTIONS MAY RESULT IN THE CANCELLATION OF YOUR SURGERY  PATIENT SIGNATURE_________________________________  NURSE SIGNATURE__________________________________  ________________________________________________________________________

## 2022-10-24 ENCOUNTER — Emergency Department (HOSPITAL_COMMUNITY): Payer: Medicaid Other

## 2022-10-24 ENCOUNTER — Emergency Department (HOSPITAL_COMMUNITY)
Admission: EM | Admit: 2022-10-24 | Discharge: 2022-10-24 | Disposition: A | Discharge status: Home / Self Care | Payer: Medicaid Other | Attending: Emergency Medicine | Admitting: Emergency Medicine

## 2022-10-24 ENCOUNTER — Encounter (HOSPITAL_COMMUNITY): Payer: Self-pay | Admitting: *Deleted

## 2022-10-24 ENCOUNTER — Other Ambulatory Visit: Payer: Self-pay

## 2022-10-24 DIAGNOSIS — R079 Chest pain, unspecified: Secondary | ICD-10-CM | POA: Diagnosis not present

## 2022-10-24 DIAGNOSIS — J45909 Unspecified asthma, uncomplicated: Secondary | ICD-10-CM | POA: Insufficient documentation

## 2022-10-24 DIAGNOSIS — R0789 Other chest pain: Secondary | ICD-10-CM | POA: Diagnosis not present

## 2022-10-24 LAB — BASIC METABOLIC PANEL
Anion gap: 12 (ref 5–15)
BUN: 8 mg/dL (ref 6–20)
CO2: 26 mmol/L (ref 22–32)
Calcium: 9.4 mg/dL (ref 8.9–10.3)
Chloride: 99 mmol/L (ref 98–111)
Creatinine, Ser: 0.74 mg/dL (ref 0.44–1.00)
GFR, Estimated: >60 mL/min (ref >60)
Glucose, Bld: 116 mg/dL — ABNORMAL HIGH (ref 70–99)
Potassium: 3.6 mmol/L (ref 3.5–5.1)
Sodium: 137 mmol/L (ref 135–145)

## 2022-10-24 LAB — I-STAT BETA HCG BLOOD, ED (MC, WL, AP ONLY): I-stat hCG, quantitative: <5 m[IU]/mL (ref <5)

## 2022-10-24 LAB — CBC
HCT: 34.2 % — ABNORMAL LOW (ref 36.0–46.0)
Hemoglobin: 10.9 g/dL — ABNORMAL LOW (ref 12.0–15.0)
MCH: 26.7 pg (ref 26.0–34.0)
MCHC: 31.9 g/dL (ref 30.0–36.0)
MCV: 83.6 fL (ref 80.0–100.0)
Platelets: 293 10*3/uL (ref 150–400)
RBC: 4.09 MIL/uL (ref 3.87–5.11)
RDW: 13.5 % (ref 11.5–15.5)
WBC: 5.6 10*3/uL (ref 4.0–10.5)
nRBC: 0 % (ref 0.0–0.2)

## 2022-10-24 LAB — TROPONIN I (HIGH SENSITIVITY): Troponin I (High Sensitivity): <2 ng/L (ref <18)

## 2022-10-24 MED ORDER — ALUM & MAG HYDROXIDE-SIMETH 200-200-20 MG/5ML PO SUSP
30.0000 mL | Freq: Once | ORAL | Status: AC
  Administered 2022-10-24: 30 mL via ORAL
  Filled 2022-10-24: qty 30

## 2022-10-24 MED ORDER — SUCRALFATE 1 G PO TABS: 1.0000 g | ORAL_TABLET | Freq: Four times a day (QID) | ORAL | 0 refills | Status: DC | PRN

## 2022-10-24 NOTE — Discharge Instructions (Addendum)
You were evaluated in the Emergency Department and after careful evaluation, we did not find any emergent condition requiring admission or further testing in the hospital.  Your exam/testing today is overall reassuring.  Recommend continuing your Nexium twice daily and using the Carafate as needed for more immediate relief.  Follow-up with your regular doctor.  Please return to the Emergency Department if you experience any worsening of your condition.   Thank you for allowing [place] to be a part of your care.

## 2022-10-24 NOTE — ED Provider Notes (Signed)
[facility]ERGENCY DEPT [facility] Emergency Department Provider Note MRN:  [id number]  Arrival date & time: 10/24/22     Chief Complaint   Chest Pain   History of Present Illness   [name] is a 47 y.o. year-old female with a history of GERD presenting to the ED with chief complaint of chest pain.  Chest pain described as a burning pain since earlier this afternoon.  Worse when laying flat.  Worse after meals.  Did not take her second dose of Nexium today.  Review of Systems  A thorough review of systems was obtained and all systems are negative except as noted in the HPI and PMH.   Patient's Health History    Past Medical History:  Diagnosis Date   Allergy    Anemia    Anxiety    Apnea 12/08/2019   Arthritis    Asthma    Back pain    Constipation    Exertional dyspnea 03/14/2020   Fatty liver    GERD (gastroesophageal reflux disease)    H/O: hysterectomy    Hx of endometriosis    Inappropriate sinus tachycardia 12/08/2019   Insomnia    Joint pain    Lactose intolerance    Migraine    Morbid obesity (HCC) 12/08/2019   Osteoporosis    Pneumonia    Pre-diabetes    Urticaria    Vitamin D deficiency     Past Surgical History:  Procedure Laterality Date   70 HOUR PH STUDY N/A 01/12/2018   Procedure: 24 HOUR PH STUDY;  Surgeon: Napoleon Form, MD;  [facility];  Service: Endoscopy;  Laterality: N/A;   ABDOMINAL HYSTERECTOMY  12/12/2003   ADENOIDECTOMY  05/2011   ANKLE ARTHROSCOPY  12/29/2007   right; with extensive debridement   BLADDER NECK RECONSTRUCTION  01/14/2011   Procedure: BLADDER NECK REPAIR;  Surgeon: Reva Bores, MD;  [facility];  Service: Gynecology;  Laterality: N/A;  Laparoscopic Repair of Incidental Cystotomy   BREAST REDUCTION SURGERY  08/05/2011   Procedure: MAMMARY REDUCTION  (BREAST);  Surgeon: Louisa Second, MD;  Location: [facility];  Service: Plastics;  Laterality: Bilateral;  bilateral    CESAREAN SECTION  12/29/2000; 1994   CHOLECYSTECTOMY     age 8   DILATION AND CURETTAGE OF UTERUS  07/08/2003   open laparoscopy   ESOPHAGEAL MANOMETRY N/A 01/12/2018   Procedure: ESOPHAGEAL MANOMETRY (EM);  Surgeon: Napoleon Form, MD;  [facility];  Service: Endoscopy;  Laterality: N/A;   GASTRIC ROUX-EN-Y N/A 03/19/2022   Procedure: LAPAROSCOPIC ROUX-EN-Y GASTRIC BYPASS WITH UPPER ENDOSCOPY;  Surgeon: Gaynelle Adu, MD;  [facility];  Service: General;  Laterality: N/A;   GIVENS CAPSULE STUDY N/A 06/23/2017   Procedure: GIVENS CAPSULE STUDY;  Surgeon: Benancio Deeds, MD;  Location: [facility] END[facility]COPY;  Service: Gastroenterology;  Laterality: N/A;   HIATAL HERNIA REPAIR N/A 03/19/2022   Procedure: HERNIA REPAIR HIATAL;  Surgeon: Gaynelle Adu, MD;  [facility];  Service: General;  Laterality: N/A;   LAPAROSCOPIC SALPINGOOPHERECTOMY  01/14/2011   left   PH IMPEDANCE STUDY N/A 01/12/2018   Procedure: PH IMPEDANCE STUDY;  Surgeon: Napoleon Form, MD;  [facility];  Service: Endoscopy;  Laterality: N/A;   RESECTION DISTAL CLAVICAL Right 09/25/2017   Procedure: RESECTION DISTAL CLAVICAL;  Surgeon: Bjorn Pippin, MD;  Location: [facility];  Service: Orthopedics;  Laterality: Right;   RIGHT OOPHORECTOMY  2011   with lysis of  adhesions   SHOULDER ACROMIOPLASTY Right 09/25/2017   Procedure: SHOULDER ACROMIOPLASTY;  Surgeon: Bjorn Pippin, MD;  Location: [facility];  Service: Orthopedics;  Laterality: Right;   SHOULDER ARTHROSCOPY WITH DEBRIDEMENT AND BICEP TENDON REPAIR Right 09/25/2017   Procedure: SHOULDER ARTHROSCOPY WITH DEBRIDEMENT AND BICEP TENDON REPAIR;  Surgeon: Bjorn Pippin, MD;  Location: [facility];  Service: Orthopedics;  Laterality: Right;   SHOULDER ARTHROSCOPY WITH ROTATOR CUFF REPAIR Right 09/25/2017   Procedure: SHOULDER ARTHROSCOPY WITH ROTATOR CUFF REPAIR;  Surgeon: Bjorn Pippin, MD;   Location: [facility];  Service: Orthopedics;  Laterality: Right;   TONSILLECTOMY  as a child   TUBAL LIGATION  12/29/2000   UPPER GASTROINTESTINAL ENDOSCOPY      Family History  Problem Relation Age of Onset   Diabetes Mother    Hypertension Mother    Hyperlipidemia Mother    Sleep apnea Mother    Thyroid disease Sister    Dementia Father    Heart attack Maternal Grandmother    Stroke Maternal Grandfather    Asthma Son    Healthy Son    Asthma Daughter    Healthy Daughter    Colon cancer Neg Hx    Colon polyps Neg Hx    Esophageal cancer Neg Hx    Rectal cancer Neg Hx    Stomach cancer Neg Hx     Social History   Socioeconomic History   Marital status: Divorced    Spouse name: Not on file   Number of children: 2   Years of education: 12th   Highest education level: Not on file  Occupational History   Occupation: Facilities manager: OTHER    Comment: [employer]  Tobacco Use   Smoking status: Never   Smokeless tobacco: Never  Vaping Use   Vaping Use: Never used  Substance and Sexual Activity   Alcohol use: Not Currently    Comment: occasional    Drug use: No   Sexual activity: Not Currently    Birth control/protection: Surgical  Other Topics Concern   Not on file  Social History Narrative   Pt lives in 2 story town home with her son   Has 2 children   High school Paramedic for [employer]   Right handed   Social Determinants of Health   Financial Resource Strain: Not on file  Food Insecurity: No Food Insecurity (03/19/2022)   Hunger Vital Sign    Worried About Running Out of Food in the Last Year: Never true    Ran Out of Food in the Last Year: Never true  Transportation Needs: No Transportation Needs (03/19/2022)   PRAPARE - Administrator, Civil Service (Medical): No    Lack of Transportation (Non-Medical): No  Physical Activity: Not on file  Stress: Not on file  Social Connections: Not on file   Intimate Partner Violence: Not At Risk (03/19/2022)   Humiliation, Afraid, Rape, and Kick questionnaire    Fear of Current or Ex-Partner: No    Emotionally Abused: No    Physically Abused: No    Sexually Abused: No     Physical Exam   Vitals:   10/24/22 0144 10/24/22 0330  BP: 115/76 116/78  Pulse: 72 (!) 56  Resp: 16   Temp: 98.3 F (36.8 C)   SpO2: 100% 100%    CONSTITUTIONAL: Well-appearing, NAD NEURO/PSYCH:  Alert and oriented x 3, no focal  deficits EYES:  eyes equal and reactive ENT/NECK:  no LAD, no JVD CARDIO: Regular rate, well-perfused, normal S1 and S2 PULM:  CTAB no wheezing or rhonchi GI/GU:  non-distended, non-tender MSK/SPINE:  No gross deformities, no edema SKIN:  no rash, atraumatic   *Additional and/or pertinent findings included in MDM below  Diagnostic and Interventional Summary    EKG Interpretation  Date/Time:  Thursday October 24 2022 03:41:01 EDT Ventricular Rate:  56 PR Interval:  151 QRS Duration: 95 QT Interval:  435 QTC Calculation: 420 R Axis:   51 Text Interpretation: Sinus rhythm Abnormal inferior Q waves Confirmed by Kennis Carina [phone]) on 10/24/2022 3:51:29 AM       Labs Reviewed  BASIC METABOLIC PANEL - Abnormal; Notable for the following components:      Result Value   Glucose, Bld 116 (*)    All other components within normal limits  CBC - Abnormal; Notable for the following components:   Hemoglobin 10.9 (*)    HCT 34.2 (*)    All other components within normal limits  I-STAT BETA HCG BLOOD, ED ([facility], [facility], [facility] ONLY)  TROPONIN I (HIGH SENSITIVITY)  TROPONIN I (HIGH SENSITIVITY)    DG Chest 2 View  Final Result      Medications  alum & mag hydroxide-simeth (MAALOX/MYLANTA) 200-200-20 MG/5ML suspension 30 mL (30 mLs Oral Given 10/24/22 0356)     Procedures  /  Critical Care Procedures  ED Course and Medical Decision Making  Initial Impression and Ddx Favoring GI etiology of chest pain.  ACS is considered as well but  felt to be less likely.  Doubt PE given no signs of DVT, no tachycardia, no hypoxia.  Patient in no acute distress at this time.  No pain at this time.  Little to no cardiovascular risk factors.  Past medical/surgical history that increases complexity of ED encounter: GERD  Interpretation of Diagnostics I personally reviewed the EKG and my interpretation is as follows: Sinus rhythm without concerning ischemic features  Labs reassuring with no significant blood count or electrolyte disturbance.  Troponin negative.  Patient Reassessment and Ultimate Disposition/Management     Discharge  Patient management required discussion with the following services or consulting groups:  None  Complexity of Problems Addressed Acute illness or injury that poses threat of life of bodily function  Additional Data Reviewed and Analyzed Further history obtained from: None  Additional Factors Impacting ED Encounter Risk Prescriptions  Elmer Sow. Pilar Plate, MD [facility] Emergency Medicine [facility] [email or web address]  Final Clinical Impressions(s) / ED Diagnoses     ICD-10-CM   1. Chest pain, unspecified type  R07.9       ED Discharge Orders          Ordered    sucralfate (CARAFATE) 1 g tablet  4 times daily PRN        10/24/22 0442             Discharge Instructions Discussed with and Provided to Patient:    Discharge Instructions      You were evaluated in the Emergency Department and after careful evaluation, we did not find any emergent condition requiring admission or further testing in the hospital.  Your exam/testing today is overall reassuring.  Recommend continuing your Nexium twice daily and using the Carafate as needed for more immediate relief.  Follow-up with your regular doctor.  Please return to the Emergency Department if you experience any worsening of your condition.   Thank you  for allowing [place] to be a part of your care.      Sabas Sous, MD 10/24/22 [phone]

## 2022-10-24 NOTE — ED Triage Notes (Signed)
The pt is c/o chest pain since yesterday  with nausea and dizziness  lmp none

## 2022-10-25 ENCOUNTER — Telehealth: Payer: Self-pay

## 2022-10-25 NOTE — Transitions of Care (Post Inpatient/ED Visit) (Signed)
   10/25/2022  Name: DEVI HOPMAN MRN: [id number] DOB: [birth year]  Today's TOC FU Call Status: Today's TOC FU Call Status:: Unsuccessul Call (1st Attempt) Unsuccessful Call (1st Attempt) Date: 10/25/22  Attempted to reach the patient regarding the most recent Inpatient/ED visit.  Follow Up Plan: Additional outreach attempts will be made to reach the patient to complete the Transitions of Care (Post Inpatient/ED visit) call.   Signature YL,RMA

## 2022-10-28 DIAGNOSIS — F411 Generalized anxiety disorder: Secondary | ICD-10-CM | POA: Diagnosis not present

## 2022-10-28 DIAGNOSIS — F330 Major depressive disorder, recurrent, mild: Secondary | ICD-10-CM | POA: Diagnosis not present

## 2022-10-29 ENCOUNTER — Encounter: Payer: Self-pay | Admitting: Hematology

## 2022-10-29 DIAGNOSIS — Z79891 Long term (current) use of opiate analgesic: Secondary | ICD-10-CM | POA: Diagnosis not present

## 2022-10-29 DIAGNOSIS — M542 Cervicalgia: Secondary | ICD-10-CM | POA: Diagnosis not present

## 2022-10-29 DIAGNOSIS — G8929 Other chronic pain: Secondary | ICD-10-CM | POA: Diagnosis not present

## 2022-10-29 DIAGNOSIS — M545 Low back pain, unspecified: Secondary | ICD-10-CM | POA: Diagnosis not present

## 2022-10-29 DIAGNOSIS — M5412 Radiculopathy, cervical region: Secondary | ICD-10-CM | POA: Diagnosis not present

## 2022-10-29 DIAGNOSIS — G894 Chronic pain syndrome: Secondary | ICD-10-CM | POA: Diagnosis not present

## 2022-10-29 DIAGNOSIS — R202 Paresthesia of skin: Secondary | ICD-10-CM | POA: Diagnosis not present

## 2022-10-29 DIAGNOSIS — Z79899 Other long term (current) drug therapy: Secondary | ICD-10-CM | POA: Diagnosis not present

## 2022-10-31 DIAGNOSIS — N3946 Mixed incontinence: Secondary | ICD-10-CM | POA: Diagnosis not present

## 2022-10-31 DIAGNOSIS — R350 Frequency of micturition: Secondary | ICD-10-CM | POA: Diagnosis not present

## 2022-10-31 DIAGNOSIS — R3914 Feeling of incomplete bladder emptying: Secondary | ICD-10-CM | POA: Diagnosis not present

## 2022-11-01 ENCOUNTER — Encounter (HOSPITAL_COMMUNITY)
Admission: RE | Disposition: A | Discharge status: Home / Self Care | Payer: Self-pay | Source: Ambulatory Visit | Attending: General Surgery

## 2022-11-01 ENCOUNTER — Ambulatory Visit (HOSPITAL_BASED_OUTPATIENT_CLINIC_OR_DEPARTMENT_OTHER): Payer: Medicaid Other | Admitting: Anesthesiology

## 2022-11-01 ENCOUNTER — Encounter (HOSPITAL_COMMUNITY): Payer: Self-pay | Admitting: General Surgery

## 2022-11-01 ENCOUNTER — Ambulatory Visit (HOSPITAL_COMMUNITY)
Admission: RE | Admit: 2022-11-01 | Discharge: 2022-11-01 | Disposition: A | Discharge status: Home / Self Care | Payer: Medicaid Other | Source: Ambulatory Visit | Attending: General Surgery | Admitting: General Surgery

## 2022-11-01 ENCOUNTER — Other Ambulatory Visit: Payer: Self-pay

## 2022-11-01 ENCOUNTER — Ambulatory Visit (HOSPITAL_COMMUNITY): Payer: Medicaid Other | Admitting: Anesthesiology

## 2022-11-01 DIAGNOSIS — E785 Hyperlipidemia, unspecified: Secondary | ICD-10-CM

## 2022-11-01 DIAGNOSIS — F418 Other specified anxiety disorders: Secondary | ICD-10-CM | POA: Diagnosis not present

## 2022-11-01 DIAGNOSIS — Z9884 Bariatric surgery status: Secondary | ICD-10-CM | POA: Diagnosis not present

## 2022-11-01 DIAGNOSIS — R109 Unspecified abdominal pain: Secondary | ICD-10-CM | POA: Insufficient documentation

## 2022-11-01 DIAGNOSIS — K219 Gastro-esophageal reflux disease without esophagitis: Secondary | ICD-10-CM | POA: Insufficient documentation

## 2022-11-01 DIAGNOSIS — K760 Fatty (change of) liver, not elsewhere classified: Secondary | ICD-10-CM | POA: Diagnosis not present

## 2022-11-01 DIAGNOSIS — Z6833 Body mass index (BMI) 33.0-33.9, adult: Secondary | ICD-10-CM | POA: Diagnosis not present

## 2022-11-01 DIAGNOSIS — Z6839 Body mass index (BMI) 39.0-39.9, adult: Secondary | ICD-10-CM | POA: Diagnosis not present

## 2022-11-01 DIAGNOSIS — Z9049 Acquired absence of other specified parts of digestive tract: Secondary | ICD-10-CM | POA: Diagnosis not present

## 2022-11-01 DIAGNOSIS — K450 Other specified abdominal hernia with obstruction, without gangrene: Secondary | ICD-10-CM | POA: Diagnosis not present

## 2022-11-01 DIAGNOSIS — K458 Other specified abdominal hernia without obstruction or gangrene: Secondary | ICD-10-CM | POA: Insufficient documentation

## 2022-11-01 DIAGNOSIS — E669 Obesity, unspecified: Secondary | ICD-10-CM | POA: Diagnosis not present

## 2022-11-01 HISTORY — PX: LAPAROSCOPY: SHX197

## 2022-11-01 HISTORY — PX: UPPER GI ENDOSCOPY: SHX6162

## 2022-11-01 SURGERY — LAPAROSCOPY, DIAGNOSTIC
Anesthesia: General

## 2022-11-01 MED ORDER — SUGAMMADEX SODIUM 200 MG/2ML IV SOLN
INTRAVENOUS | Status: DC | PRN
  Administered 2022-11-01: 200 mg via INTRAVENOUS

## 2022-11-01 MED ORDER — ROCURONIUM BROMIDE 10 MG/ML (PF) SYRINGE
PREFILLED_SYRINGE | INTRAVENOUS | Status: AC
  Filled 2022-11-01: qty 10

## 2022-11-01 MED ORDER — MIDAZOLAM HCL 2 MG/2ML IJ SOLN
INTRAMUSCULAR | Status: AC
  Filled 2022-11-01: qty 2

## 2022-11-01 MED ORDER — FENTANYL CITRATE PF 50 MCG/ML IJ SOSY
PREFILLED_SYRINGE | INTRAMUSCULAR | Status: AC
  Filled 2022-11-01: qty 1

## 2022-11-01 MED ORDER — OXYCODONE HCL 5 MG PO TABS
ORAL_TABLET | ORAL | Status: AC
  Filled 2022-11-01: qty 1

## 2022-11-01 MED ORDER — ROCURONIUM BROMIDE 10 MG/ML (PF) SYRINGE
PREFILLED_SYRINGE | INTRAVENOUS | Status: DC | PRN
  Administered 2022-11-01 (×2): 10 mg via INTRAVENOUS
  Administered 2022-11-01: 50 mg via INTRAVENOUS

## 2022-11-01 MED ORDER — MIDAZOLAM HCL 2 MG/2ML IJ SOLN
INTRAMUSCULAR | Status: DC | PRN
  Administered 2022-11-01: 2 mg via INTRAVENOUS

## 2022-11-01 MED ORDER — CEFAZOLIN SODIUM-DEXTROSE 2-4 GM/100ML-% IV SOLN
2.0000 g | INTRAVENOUS | Status: AC
  Administered 2022-11-01: 2 g via INTRAVENOUS
  Filled 2022-11-01: qty 100

## 2022-11-01 MED ORDER — LIDOCAINE HCL (PF) 2 % IJ SOLN
INTRAMUSCULAR | Status: AC
  Filled 2022-11-01: qty 5

## 2022-11-01 MED ORDER — ONDANSETRON HCL 4 MG/2ML IJ SOLN
INTRAMUSCULAR | Status: DC | PRN
  Administered 2022-11-01: 4 mg via INTRAVENOUS

## 2022-11-01 MED ORDER — FENTANYL CITRATE (PF) 250 MCG/5ML IJ SOLN
INTRAMUSCULAR | Status: DC | PRN
  Administered 2022-11-01 (×2): 50 ug via INTRAVENOUS
  Administered 2022-11-01: 100 ug via INTRAVENOUS
  Administered 2022-11-01: 50 ug via INTRAVENOUS

## 2022-11-01 MED ORDER — SODIUM CHLORIDE (PF) 0.9 % IJ SOLN
INTRAMUSCULAR | Status: AC
  Filled 2022-11-01: qty 10

## 2022-11-01 MED ORDER — ORAL CARE MOUTH RINSE: 15.0000 mL | Freq: Once | OROMUCOSAL | Status: AC

## 2022-11-01 MED ORDER — OXYCODONE HCL 5 MG PO TABS
5.0000 mg | ORAL_TABLET | Freq: Once | ORAL | Status: AC | PRN
  Administered 2022-11-01: 5 mg via ORAL

## 2022-11-01 MED ORDER — FENTANYL CITRATE PF 50 MCG/ML IJ SOSY
25.0000 ug | PREFILLED_SYRINGE | INTRAMUSCULAR | Status: DC | PRN
  Administered 2022-11-01 (×2): 50 ug via INTRAVENOUS

## 2022-11-01 MED ORDER — LIDOCAINE 2% (20 MG/ML) 5 ML SYRINGE
INTRAMUSCULAR | Status: DC | PRN
  Administered 2022-11-01: 100 mg via INTRAVENOUS

## 2022-11-01 MED ORDER — AMISULPRIDE (ANTIEMETIC) 5 MG/2ML IV SOLN: 10.0000 mg | Freq: Once | INTRAVENOUS | Status: DC | PRN

## 2022-11-01 MED ORDER — PROPOFOL 10 MG/ML IV BOLUS
INTRAVENOUS | Status: AC
  Filled 2022-11-01: qty 20

## 2022-11-01 MED ORDER — CHLORHEXIDINE GLUCONATE 0.12 % MT SOLN
15.0000 mL | Freq: Once | OROMUCOSAL | Status: AC
  Administered 2022-11-01: 15 mL via OROMUCOSAL

## 2022-11-01 MED ORDER — LACTATED RINGERS IV SOLN: INTRAVENOUS | Status: DC

## 2022-11-01 MED ORDER — BUPIVACAINE HCL (PF) 0.25 % IJ SOLN
INTRAMUSCULAR | Status: DC | PRN
  Administered 2022-11-01: 30 mL

## 2022-11-01 MED ORDER — BUPIVACAINE HCL (PF) 0.25 % IJ SOLN
INTRAMUSCULAR | Status: AC
  Filled 2022-11-01: qty 30

## 2022-11-01 MED ORDER — KETOROLAC TROMETHAMINE 15 MG/ML IJ SOLN
INTRAMUSCULAR | Status: DC | PRN
  Administered 2022-11-01: 15 mg via INTRAVENOUS

## 2022-11-01 MED ORDER — PROPOFOL 10 MG/ML IV BOLUS
INTRAVENOUS | Status: DC | PRN
  Administered 2022-11-01: 150 mg via INTRAVENOUS

## 2022-11-01 MED ORDER — FENTANYL CITRATE (PF) 250 MCG/5ML IJ SOLN
INTRAMUSCULAR | Status: AC
  Filled 2022-11-01: qty 5

## 2022-11-01 MED ORDER — ONDANSETRON HCL 4 MG PO TABS: 4.0000 mg | ORAL_TABLET | Freq: Four times a day (QID) | ORAL | 0 refills | Status: DC

## 2022-11-01 MED ORDER — ACETAMINOPHEN 500 MG PO TABS
1000.0000 mg | ORAL_TABLET | ORAL | Status: AC
  Administered 2022-11-01: 1000 mg via ORAL
  Filled 2022-11-01: qty 2

## 2022-11-01 MED ORDER — LACTATED RINGERS IV SOLN: INTRAVENOUS | Status: DC | PRN

## 2022-11-01 MED ORDER — ONDANSETRON HCL 4 MG/2ML IJ SOLN
INTRAMUSCULAR | Status: AC
  Filled 2022-11-01: qty 2

## 2022-11-01 MED ORDER — 0.9 % SODIUM CHLORIDE (POUR BTL) OPTIME
TOPICAL | Status: DC | PRN
  Administered 2022-11-01: 1000 mL

## 2022-11-01 MED ORDER — CHLORHEXIDINE GLUCONATE CLOTH 2 % EX PADS: 6.0000 | MEDICATED_PAD | Freq: Once | CUTANEOUS | Status: DC

## 2022-11-01 MED ORDER — KETOROLAC TROMETHAMINE 30 MG/ML IJ SOLN
INTRAMUSCULAR | Status: AC
  Filled 2022-11-01: qty 1

## 2022-11-01 MED ORDER — DEXAMETHASONE SODIUM PHOSPHATE 10 MG/ML IJ SOLN
INTRAMUSCULAR | Status: DC | PRN
  Administered 2022-11-01: 10 mg via INTRAVENOUS

## 2022-11-01 MED ORDER — DEXAMETHASONE SODIUM PHOSPHATE 10 MG/ML IJ SOLN
INTRAMUSCULAR | Status: AC
  Filled 2022-11-01: qty 1

## 2022-11-01 MED ORDER — OXYCODONE HCL 5 MG/5ML PO SOLN: 5.0000 mg | Freq: Once | ORAL | Status: AC | PRN

## 2022-11-01 SURGICAL SUPPLY — 67 items
ADH SKN CLS APL DERMABOND .7 (GAUZE/BANDAGES/DRESSINGS)
APL PRP STRL LF DISP 70% ISPRP (MISCELLANEOUS) ×1
APPLIER CLIP 5 13 M/L LIGAMAX5 (MISCELLANEOUS)
APPLIER CLIP ROT 10 11.4 M/L (STAPLE)
APR CLP MED LRG 11.4X10 (STAPLE)
APR CLP MED LRG 5 ANG JAW (MISCELLANEOUS)
BAG COUNTER SPONGE SURGICOUNT (BAG) IMPLANT
BAG SPNG CNTER NS LX DISP (BAG)
BLADE EXTENDED COATED 6.5IN (ELECTRODE) IMPLANT
BLADE SURG SZ10 CARB STEEL (BLADE) IMPLANT
CELLS DAT CNTRL 66122 CELL SVR (MISCELLANEOUS) IMPLANT
CHLORAPREP W/TINT 26 (MISCELLANEOUS) ×1 IMPLANT
CLIP APPLIE 5 13 M/L LIGAMAX5 (MISCELLANEOUS) IMPLANT
CLIP APPLIE ROT 10 11.4 M/L (STAPLE) IMPLANT
COVER MAYO STAND STRL (DRAPES) IMPLANT
COVER SURGICAL LIGHT HANDLE (MISCELLANEOUS) ×1 IMPLANT
DERMABOND ADVANCED .7 DNX12 (GAUZE/BANDAGES/DRESSINGS) IMPLANT
DEVICE SUTURE ENDOST 10MM (ENDOMECHANICALS) IMPLANT
DRAPE SHEET LG 3/4 BI-LAMINATE (DRAPES) IMPLANT
DRAPE WARM FLUID 44X44 (DRAPES) IMPLANT
ELECT REM PT RETURN 15FT ADLT (MISCELLANEOUS) ×1 IMPLANT
GAUZE SPONGE 4X4 12PLY STRL (GAUZE/BANDAGES/DRESSINGS) ×1 IMPLANT
GLOVE BIO SURGEON STRL SZ7.5 (GLOVE) ×1 IMPLANT
GLOVE INDICATOR 8.0 STRL GRN (GLOVE) ×1 IMPLANT
GOWN STRL REUS W/ TWL XL LVL3 (GOWN DISPOSABLE) ×4 IMPLANT
GOWN STRL REUS W/TWL XL LVL3 (GOWN DISPOSABLE) ×4
GRASPER SUT TROCAR 14GX15 (MISCELLANEOUS) IMPLANT
HANDLE SUCTION POOLE (INSTRUMENTS) IMPLANT
IRRIG SUCT STRYKERFLOW 2 WTIP (MISCELLANEOUS) ×1
IRRIGATION SUCT STRKRFLW 2 WTP (MISCELLANEOUS) IMPLANT
KIT BASIN OR (CUSTOM PROCEDURE TRAY) ×1 IMPLANT
KIT TURNOVER KIT A (KITS) IMPLANT
LEGGING LITHOTOMY PAIR STRL (DRAPES) IMPLANT
RELOAD ENDO STITCH 2.0 (ENDOMECHANICALS) ×2
RELOAD SUT SNGL STCH BLK 2-0 (ENDOMECHANICALS) IMPLANT
RETRACTOR WND ALEXIS 18 MED (MISCELLANEOUS) IMPLANT
RTRCTR WOUND ALEXIS 18CM MED (MISCELLANEOUS)
SCISSORS LAP 5X35 DISP (ENDOMECHANICALS) ×1 IMPLANT
SHEARS HARMONIC ACE PLUS 36CM (ENDOMECHANICALS) IMPLANT
SLEEVE Z-THREAD 5X100MM (TROCAR) ×1 IMPLANT
SPIKE FLUID TRANSFER (MISCELLANEOUS) ×1 IMPLANT
STAPLER VISISTAT 35W (STAPLE) IMPLANT
STRIP CLOSURE SKIN 1/2X4 (GAUZE/BANDAGES/DRESSINGS) IMPLANT
SUCTION POOLE HANDLE (INSTRUMENTS)
SUT MNCRL AB 4-0 PS2 18 (SUTURE) IMPLANT
SUT PDS AB 1 TP1 96 (SUTURE) IMPLANT
SUT PROLENE 2 0 KS (SUTURE) IMPLANT
SUT PROLENE 2 0 SH DA (SUTURE) IMPLANT
SUT RELOAD ENDO STITCH 2.0 (ENDOMECHANICALS) ×2
SUT SILK 2 0 (SUTURE)
SUT SILK 2 0 SH CR/8 (SUTURE) IMPLANT
SUT SILK 2-0 18XBRD TIE 12 (SUTURE) IMPLANT
SUT SILK 3 0 (SUTURE)
SUT SILK 3 0 SH CR/8 (SUTURE) IMPLANT
SUT SILK 3-0 18XBRD TIE 12 (SUTURE) IMPLANT
SUT VICRYL 0 TIES 12 18 (SUTURE) IMPLANT
SUTURE RELOAD ENDO STITCH 2.0 (ENDOMECHANICALS) ×2 IMPLANT
SYR BULB IRRIG 60ML STRL (SYRINGE) IMPLANT
SYS LAPSCP GELPORT 120MM (MISCELLANEOUS)
SYSTEM LAPSCP GELPORT 120MM (MISCELLANEOUS) IMPLANT
TOWEL OR 17X26 10 PK STRL BLUE (TOWEL DISPOSABLE) ×1 IMPLANT
TOWEL OR NON WOVEN STRL DISP B (DISPOSABLE) ×1 IMPLANT
TRAY FOLEY MTR SLVR 16FR STAT (SET/KITS/TRAYS/PACK) ×1 IMPLANT
TRAY LAPAROSCOPIC (CUSTOM PROCEDURE TRAY) ×1 IMPLANT
TROCAR 11X100 Z THREAD (TROCAR) IMPLANT
TROCAR Z-THREAD OPTICAL 5X100M (TROCAR) ×1 IMPLANT
YANKAUER SUCT BULB TIP NO VENT (SUCTIONS) IMPLANT

## 2022-11-01 NOTE — Anesthesia Preprocedure Evaluation (Addendum)
Anesthesia Evaluation  Patient identified by MRN, date of birth, ID band Patient awake    Reviewed: Allergy & Precautions, NPO status , Patient's Chart, lab work & pertinent test results  History of Anesthesia Complications Negative for: history of anesthetic complications  Airway Mallampati: III  TM Distance: >3 FB Neck ROM: Full   Comment: Previous grade I view with MAC 4, easy mask Dental  (+) Dental Advisory Given   Pulmonary neg shortness of breath, asthma , neg sleep apnea, neg COPD, neg recent URI   Pulmonary exam normal breath sounds clear to auscultation       Cardiovascular (-) hypertension(-) angina (-) Past MI, (-) Cardiac Stents and (-) CABG (-) dysrhythmias  Rhythm:Regular Rate:Normal  HLD  TTE 04/17/2020: IMPRESSIONS     1. Left ventricular ejection fraction, by estimation, is 60 to 65%. The  left ventricle has normal function. The left ventricle has no regional  wall motion abnormalities. Left ventricular diastolic parameters were  normal.   2. Right ventricular systolic function is normal. The right ventricular  size is mildly enlarged. There is normal pulmonary artery systolic  pressure. The estimated right ventricular systolic pressure is 23.8 mmHg.   3. Right atrial size was mildly dilated.   4. The mitral valve is normal in structure. No evidence of mitral valve  regurgitation. No evidence of mitral stenosis.   5. The aortic valve is normal in structure. Aortic valve regurgitation is  not visualized. No aortic stenosis is present.   6. The inferior vena cava is normal in size with greater than 50%  respiratory variability, suggesting right atrial pressure of 3 mmHg.   Normal stress test 01/24/2017    Neuro/Psych  Headaches, neg Seizures PSYCHIATRIC DISORDERS Anxiety Depression       GI/Hepatic ,GERD  Medicated,,(+) neg Cirrhosis      Fatty liver S/p gastric bypass   Endo/Other  neg diabetes   Morbid obesityPre-diabetes  Renal/GU negative Renal ROS     Musculoskeletal  (+) Arthritis ,  osteoporosis   Abdominal  (+) + obese  Peds  Hematology  (+) Blood dyscrasia, anemia   Anesthesia Other Findings Recently evaluated in ED for chest pain. ECG and troponins negative. Suspected gastric causes per ED note.  Reproductive/Obstetrics                             Anesthesia Physical Anesthesia Plan  ASA: 3  Anesthesia Plan: General   Post-op Pain Management: Tylenol PO (pre-op)*   Induction: Intravenous  PONV Risk Score and Plan: 3 and Ondansetron, Dexamethasone and Treatment may vary due to age or medical condition  Airway Management Planned: Oral ETT  Additional Equipment:   Intra-op Plan:   Post-operative Plan: Extubation in OR  Informed Consent: I have reviewed the patients History and Physical, chart, labs and discussed the procedure including the risks, benefits and alternatives for the proposed anesthesia with the patient or authorized representative who has indicated his/her understanding and acceptance.     Dental advisory given  Plan Discussed with: CRNA and Anesthesiologist  Anesthesia Plan Comments: (Risks of general anesthesia discussed including, but not limited to, sore throat, hoarse voice, chipped/damaged teeth, injury to vocal cords, nausea and vomiting, allergic reactions, lung infection, heart attack, stroke, and death. All questions answered. )        Anesthesia Quick Evaluation

## 2022-11-01 NOTE — Transfer of Care (Signed)
Immediate Anesthesia Transfer of Care Note  Patient: Roslynn S Eguia  Procedure(s) Performed: LAPAROSCOPY DIAGNOSTIC; LAPARSCOPIC CLOSURE OF PETERSEN SPACE UPPER GI ENDOSCOPY  Patient Location: PACU  Anesthesia Type:General  Level of Consciousness: awake  Airway & Oxygen Therapy: Patient Spontanous Breathing and Patient connected to face mask oxygen  Post-op Assessment: Report given to RN and Post -op Vital signs reviewed and stable  Post vital signs: Reviewed and stable  Last Vitals:  Vitals Value Taken Time  BP    Temp    Pulse 73 11/01/22 1504  Resp 12 11/01/22 1504  SpO2 100 % 11/01/22 1504  Vitals shown include unvalidated device data.  Last Pain:  Vitals:   11/01/22 1132  TempSrc: Oral  PainSc:          Complications: No notable events documented.

## 2022-11-01 NOTE — Anesthesia Postprocedure Evaluation (Signed)
Anesthesia Post Note  Patient: [name]  Procedure(s) Performed: LAPAROSCOPY DIAGNOSTIC; LAPARSCOPIC CLOSURE OF PETERSEN SPACE UPPER GI ENDOSCOPY     Patient location during evaluation: PACU Anesthesia Type: General Level of consciousness: awake Pain management: pain level controlled Vital Signs Assessment: post-procedure vital signs reviewed and stable Respiratory status: spontaneous breathing, nonlabored ventilation and respiratory function stable Cardiovascular status: blood pressure returned to baseline and stable Postop Assessment: no apparent nausea or vomiting Anesthetic complications: no   No notable events documented.  Last Vitals:  Vitals:   11/01/22 1530 11/01/22 1545  BP: 106/83 112/78  Pulse: (!) 57 61  Resp: 10 14  Temp:    SpO2: 100% 100%    Last Pain:  Vitals:   11/01/22 1545  TempSrc:   PainSc: 3                  Linton Rump

## 2022-11-01 NOTE — H&P (Signed)
CC: RUQ pain  Requesting provider: n/a  HPI: [name] is an 47 y.o. female who is here for diagnostic laparoscopy, possible revision of jejunojejunostomy and possible upper endoscopy with biopsy.  Patient has a history of laparoscopic Roux-en-Y gastric bypass November 2023.  She has had ongoing intermittent right upper quadrant pain which she describes as underneath her right rib cage.  It can be postprandial but it can also be sporadic.  She has had numerous CT scans.  Some of her CT scans have shown some thickening of the jejunum at or around the jejunojejunostomy.  There is been no signs of internal hernia on any of her scans.  Some of her scans have also been completely normal.  She does have a fair amount of colonic stool burden.  Because her discomfort has been persistent and imaging has been indeterminate I recommended diagnostic laparoscopy as the next step.    He was in the emergency room about 8 days ago complaining of chest pain.  She described it as a burning sensation.  Worse when laying flat worse after meals.  They checked an EKG.  They diagnosed her with reflux.  Other than that she denies any changes since we last spoke.  She denies any fever or chills.  Past Medical History:  Diagnosis Date   Allergy    Anemia    Anxiety    Apnea 12/08/2019   Arthritis    Asthma    Back pain    Constipation    Exertional dyspnea 03/14/2020   Fatty liver    GERD (gastroesophageal reflux disease)    H/O: hysterectomy    Hx of endometriosis    Inappropriate sinus tachycardia 12/08/2019   Insomnia    Joint pain    Lactose intolerance    Migraine    Morbid obesity (HCC) 12/08/2019   Osteoporosis    Pneumonia    Pre-diabetes    Urticaria    Vitamin D deficiency     Past Surgical History:  Procedure Laterality Date   33 HOUR PH STUDY N/A 01/12/2018   Procedure: 24 HOUR PH STUDY;  Surgeon: Napoleon Form, MD;  [facility];  Service: Endoscopy;  Laterality:  N/A;   ABDOMINAL HYSTERECTOMY  12/12/2003   ADENOIDECTOMY  05/2011   ANKLE ARTHROSCOPY  12/29/2007   right; with extensive debridement   BLADDER NECK RECONSTRUCTION  01/14/2011   Procedure: BLADDER NECK REPAIR;  Surgeon: Reva Bores, MD;  [facility];  Service: Gynecology;  Laterality: N/A;  Laparoscopic Repair of Incidental Cystotomy   BREAST REDUCTION SURGERY  08/05/2011   Procedure: MAMMARY REDUCTION  (BREAST);  Surgeon: Louisa Second, MD;  Location: [facility];  Service: Plastics;  Laterality: Bilateral;  bilateral   CESAREAN SECTION  12/29/2000; 1994   CHOLECYSTECTOMY     age 68   DILATION AND CURETTAGE OF UTERUS  07/08/2003   open laparoscopy   ESOPHAGEAL MANOMETRY N/A 01/12/2018   Procedure: ESOPHAGEAL MANOMETRY (EM);  Surgeon: Napoleon Form, MD;  [facility];  Service: Endoscopy;  Laterality: N/A;   GASTRIC ROUX-EN-Y N/A 03/19/2022   Procedure: LAPAROSCOPIC ROUX-EN-Y GASTRIC BYPASS WITH UPPER ENDOSCOPY;  Surgeon: Gaynelle Adu, MD;  [facility];  Service: General;  Laterality: N/A;   GIVENS CAPSULE STUDY N/A 06/23/2017   Procedure: GIVENS CAPSULE STUDY;  Surgeon: Benancio Deeds, MD;  Location: [facility];  Service: Gastroenterology;  Laterality: N/A;   HIATAL HERNIA REPAIR N/A 03/19/2022   Procedure: HERNIA REPAIR  HIATAL;  Surgeon: Gaynelle Adu, MD;  [facility];  Service: General;  Laterality: N/A;   LAPAROSCOPIC SALPINGOOPHERECTOMY  01/14/2011   left   PH IMPEDANCE STUDY N/A 01/12/2018   Procedure: PH IMPEDANCE STUDY;  Surgeon: Napoleon Form, MD;  [facility];  Service: Endoscopy;  Laterality: N/A;   RESECTION DISTAL CLAVICAL Right 09/25/2017   Procedure: RESECTION DISTAL CLAVICAL;  Surgeon: Bjorn Pippin, MD;  Location: [facility];  Service: Orthopedics;  Laterality: Right;   RIGHT OOPHORECTOMY  2011   with lysis of adhesions   SHOULDER ACROMIOPLASTY Right 09/25/2017   Procedure:  SHOULDER ACROMIOPLASTY;  Surgeon: Bjorn Pippin, MD;  Location: [facility];  Service: Orthopedics;  Laterality: Right;   SHOULDER ARTHROSCOPY WITH DEBRIDEMENT AND BICEP TENDON REPAIR Right 09/25/2017   Procedure: SHOULDER ARTHROSCOPY WITH DEBRIDEMENT AND BICEP TENDON REPAIR;  Surgeon: Bjorn Pippin, MD;  Location: [facility];  Service: Orthopedics;  Laterality: Right;   SHOULDER ARTHROSCOPY WITH ROTATOR CUFF REPAIR Right 09/25/2017   Procedure: SHOULDER ARTHROSCOPY WITH ROTATOR CUFF REPAIR;  Surgeon: Bjorn Pippin, MD;  Location: [facility];  Service: Orthopedics;  Laterality: Right;   TONSILLECTOMY  as a child   TUBAL LIGATION  12/29/2000   UPPER GASTROINTESTINAL ENDOSCOPY      Family History  Problem Relation Age of Onset   Diabetes Mother    Hypertension Mother    Hyperlipidemia Mother    Sleep apnea Mother    Thyroid disease Sister    Dementia Father    Heart attack Maternal Grandmother    Stroke Maternal Grandfather    Asthma Son    Healthy Son    Asthma Daughter    Healthy Daughter    Colon cancer Neg Hx    Colon polyps Neg Hx    Esophageal cancer Neg Hx    Rectal cancer Neg Hx    Stomach cancer Neg Hx     Social:  reports that she has never smoked. She has never used smokeless tobacco. She reports that she does not currently use alcohol. She reports that she does not use drugs.  Allergies:  Allergies  Allergen Reactions   Aspirin Other (See Comments)    MD advised pt to not take med   Ibuprofen Other (See Comments)    MD advised pt to not take med    Rosanne Ashing Allergy] Swelling   Zoledronic Acid Other (See Comments)    Muscle aches    Medications: I have reviewed the patient's current medications.   ROS - all of the below systems have been reviewed with the patient and positives are indicated with bold text General: chills, fever or night sweats Eyes: blurry vision or double vision ENT: epistaxis or sore  throat Allergy/Immunology: itchy/watery eyes or nasal congestion Hematologic/Lymphatic: bleeding problems, blood clots or swollen lymph nodes Endocrine: temperature intolerance or unexpected weight changes Breast: new or changing breast lumps or nipple discharge Resp: cough, shortness of breath, or wheezing CV: chest pain or dyspnea on exertion GI: as per HPI GU: dysuria, trouble voiding, or hematuria MSK: joint pain or joint stiffness Neuro: TIA or stroke symptoms Derm: pruritus and skin lesion changes Psych: anxiety and depression  PE Blood pressure 124/82, pulse 64, temperature 98.5 F (36.9 C), temperature source Oral, resp. rate 18, height 5\' 4"  (1.626 m), weight 89.8 kg, last menstrual period 12/27/2003, SpO2 99 %. Constitutional: NAD; conversant; no deformities Eyes: Moist conjunctiva; no lid lag; anicteric;  PERRL Neck: Trachea midline; no thyromegaly Lungs: Normal respiratory effort; no tactile fremitus CV: RRR; no palpable thrills; no pitting edema GI: Abd soft, nontender, old trocar scars; no palpable hepatosplenomegaly MSK: Normal gait; no clubbing/cyanosis Psychiatric: Appropriate affect; alert and oriented x3 Lymphatic: No palpable cervical or axillary lymphadenopathy Skin: No rash, lesions or jaundice  No results found for this or any previous visit (from the past 48 hour(s)).  No results found.  Imaging: reviewed  A/P: [name] is an 47 y.o. female  S/p laparoscopic roux en y gastric bypass nov 2023 Remittent colicky right upper quadrant pain History of cholecystectomy Reflux   Plan is to go to the operating room for diagnostic laparoscopy, possible revision of jejunojejunostomy, upper endoscopy possible biopsy.  Patient has had ongoing intermittent right upper quadrant pain of unclear etiology.  Some of her CT scans have been normal some have been a little bit abnormal in the sense of mild wall thickening of the jejunum and or near her  jejunojejunostomy/Roux limb.  No overt signs of internal hernia on imaging.  But because her discomfort is persistent I recommended diagnostic laparoscopy as a neck step.  We discussed that her laparoscopy may not yield any pertinent findings.  We also discussed risk and benefits of upper endoscopy such as bleeding, perforation failure to diagnose.  We did discuss the risk of the procedure again such as bleeding, infection, injury to surrounding structures, perioperative cardiac and pulmonary events, blood clots, potential need to convert to open.  We did discuss that should we have to do a bowel resection there is the risk of leak and stricture.  We did discuss that we may not find anything but I think this is the next best reasonable step since this has been going on for a while  Mary Sella. Andrey Campanile, MD, FACS General, Bariatric, & Minimally Invasive Surgery [facility]

## 2022-11-01 NOTE — Discharge Instructions (Signed)
LAPAROSCOPIC SURGERY: POST OP INSTRUCTIONS Always review your discharge instruction sheet given to you by the facility where your surgery was performed. IF YOU HAVE DISABILITY OR FAMILY LEAVE FORMS, YOU MUST BRING THEM TO THE OFFICE FOR PROCESSING.   DO NOT GIVE THEM TO YOUR DOCTOR.  PAIN CONTROL  First take acetaminophen (Tylenol)  to control your pain after surgery.  Follow directions on package.  Taking acetaminophen (Tylenol)  regularly after surgery will help to control your pain and lower the amount of prescription pain medication you may need.  You should not take more than 3,000 mg (3 grams) of acetaminophen (Tylenol) in 24 hours.  You should not take ibuprofen (Advil), aleve, motrin, naprosyn or other NSAIDS if you have a history of stomach ulcers or gastric bypass or chronic kidney disease.  A prescription for pain medication may be given to you upon discharge.  Take your pain medication as prescribed, if you still have uncontrolled pain after taking acetaminophen (Tylenol) . Use ice packs to help control pain. If you need a refill on your pain medication, please contact your pharmacy.  They will contact our office to request authorization. Prescriptions will not be filled after 5pm or on week-ends.  HOME MEDICATIONS Take your usually prescribed medications unless otherwise directed.  DIET You should follow a light diet the first few days after arrival home.  Be sure to include lots of fluids daily. Avoid fatty, fried foods.   CONSTIPATION It is common to experience some constipation after surgery and if you are taking pain medication.  Increasing fluid intake and taking a stool softener (such as Colace) will usually help or prevent this problem from occurring.  A mild laxative (Milk of Magnesia or Miralax) should be taken according to package instructions if there are no bowel movements after 48 hours.  WOUND/INCISION CARE Most patients will experience some swelling and bruising in  the area of the incisions.  Ice packs will help.  Swelling and bruising can take several days to resolve.  Unless discharge instructions indicate otherwise, follow guidelines below  STERI-STRIPS - you may remove your outer bandages 48 hours after surgery, and you may shower at that time.  You have steri-strips (small skin tapes) in place directly over the incision.  These strips should be left on the skin for 7-10 days.   DERMABOND/SKIN GLUE - you may shower in 24 hours.  The glue will flake off over the next 2-3 weeks. Any sutures or staples will be removed at the office during your follow-up visit.  ACTIVITIES You may resume regular (light) daily activities beginning the next day--such as daily self-care, walking, climbing stairs--gradually increasing activities as tolerated.  You may have sexual intercourse when it is comfortable.  Refrain from any heavy lifting or straining until approved by your doctor. You may drive when you are no longer taking prescription pain medication, you can comfortably wear a seatbelt, and you can safely maneuver your car and apply brakes.  FOLLOW-UP You should see your doctor in the office for a follow-up appointment approximately 2-3 weeks after your surgery.  You should have been given your post-op/follow-up appointment when your surgery was scheduled.  If you did not receive a post-op/follow-up appointment, make sure that you call for this appointment within a day or two after you arrive home to insure a convenient appointment time.  OTHER INSTRUCTIONS   WHEN TO CALL YOUR DOCTOR: Fever over 101.0 Inability to urinate Continued bleeding from incision. Increased pain, redness, or drainage  from the incision. Increasing abdominal pain  The clinic staff is available to answer your questions during regular business hours.  Please don't hesitate to call and ask to speak to one of the nurses for clinical concerns.  If you have a medical emergency, go to the nearest  emergency room or call 911.  A surgeon from [facility] is always on call at the hospital. [address], [place], [place]  [address] ? [address] [address], [place], [place]   [address] [phone] ? [phone] ? FAX [phone] Web site: [email or web address]

## 2022-11-01 NOTE — Anesthesia Procedure Notes (Signed)
Procedure Name: Intubation Date/Time: 11/01/2022 1:37 PM  Performed by: Florene Route, CRNAPre-anesthesia Checklist: Patient identified, Emergency Drugs available, Suction available and Patient being monitored Patient Re-evaluated:Patient Re-evaluated prior to induction Oxygen Delivery Method: Circle system utilized Preoxygenation: Pre-oxygenation with 100% oxygen Induction Type: IV induction Ventilation: Mask ventilation without difficulty Laryngoscope Size: Miller and 2 Grade View: Grade I Tube type: Oral Tube size: 7.5 mm Number of attempts: 1 Airway Equipment and Method: Stylet and Oral airway Placement Confirmation: ETT inserted through vocal cords under direct vision, positive ETCO2 and breath sounds checked- equal and bilateral Secured at: 21 cm Tube secured with: Tape Dental Injury: Teeth and Oropharynx as per pre-operative assessment

## 2022-11-01 NOTE — Op Note (Signed)
11/01/2022  2:53 PM  PATIENT:  [name]  47 y.o. female  PRE-OPERATIVE DIAGNOSIS:  ABDOMINAL PAIN; h/o laparoscopic gastric bypass 11/23  POST-OPERATIVE DIAGNOSIS:  same; + Petersen's Hernia defect  PROCEDURE:  Procedure(s): LAPAROSCOPY DIAGNOSTIC; LAPARSCOPIC CLOSURE OF PETERSEN SPACE UPPER GI ENDOSCOPY  SURGEON:  Surgeon(s): Gaynelle Adu, MD   ASSISTANTS: Darnell Level, MD (an assistant was requested to aid with tissue retraction and mobilization)  ANESTHESIA:   general  DRAINS: none   LOCAL MEDICATIONS USED:  MARCAINE     EBL: 10cc  SPECIMEN:  No Specimen  DISPOSITION OF SPECIMEN:  N/A  COUNTS:  YES  INDICATION FOR PROCEDURE: 47 year old female who underwent laparoscopic Roux-en-Y gastric bypass back in November 2023.  [name]  PROCEDURE: After obtaining informed consent the patient was taken the OR 1 at [facility] placed supine on the operating room table.  General endotracheal anesthesia was established.  Sequential compression devices were placed.  [name]  IV antibiotic had been  administered.  Surgical timeout was performed.  [name]  Then using a 0 degree 5 mm laparoscope through a 5 mm trocar I advanced it through all layers of the abdominal wall and carefully entered the abdominal cavity.  Pneumoperitoneum was smoothly established up to a patient pressure of 15 mmHg.  There is no evidence of injury to surrounding structures.  Additional 5 mm trocars were placed in the left lateral abdominal wall & in the left lower quadrant.  There were no adhesions to the anterior abdominal wall.  The colon was bulky consistent with a large stool burden.  [name]  Her appendix is normal.  We identified the terminal ileum and started running the bowel back proximately using atraumatic bowel graspers.  In her mid ileum there was a section of ileum that was adhered to itself in several loops but none of this appeared pathologically significant.  There is no upstream dilatation prior to this area.  I continued running the bowel back proximally.  We came across the jejunojejunostomy.  It was a little bit dilated.  There was no mesenteric defect at the jejunojejunostomy.  I ended up putting an additional 5 mm trocar in the infraumbilical position.  The camera was placed through that trocar.  We then looked in the upper abdomen.  I identified the gastric pouch and the Roux limb and ran it down distally to the jejunojejunostomy.  As I lifted the Roux limb as it traversed anteriorly to the transverse colon there  appeared to be a mesenteric defect at [facility] space (space between the antecolic Roux limb and the transverse colon).  There was some of the biliary pancreatic limb herniating through this defect.  I reduced it.  I continued running the Roux limb down to the jejunojejunostomy.  I then identified the biliopancreatic limb at this location and ran it back proximately to the ligament of Treitz.  Again  I confirmed that there was a mesenteric defect at [place] space.  [name]  I changed the left lower quadrant 5 mm trocar to an 11 mm trocar.  Then using a Endo Stitch with a 2-0 silk suture I then closed the mesenteric defect between the transverse colon and the small bowel mesentery thus obliterating the defect where no bowel could telescoped through it.  I then reran the bowel starting at the Roux limb at the gastric pouch distally to the jejunojejunostomy.  I then reran the biliopancreatic limb up to the ligament of Treitz.  There is no evidence of injury.  The bowel was not twisted.  I then came back down to the jejunojejunostomy and ran it distally all the way to the cecum.  There is no evidence of injury to surrounding viscera.  There is no evidence of twisting of the bowel.  At this point I removed the 11 mm trocar in the left lower quadrant and closed the fascial defect with an interrupted 0 Vicryl using PMI suture passer.  Additional local was infiltrated in this area.  Trocars were removed and pneumoperitoneum was released.  Dr. Gerrit Friends closed the skin while I scrubbed out and went above and performed an upper endoscopy.  [name]  Her upper mid and distal esophagus appeared normal.  There was no bile or fluid within the esophagus.  Identified the Z-line at 34 cm from the incisors.  The Z-line was regular.  There is no irregularity.  There is no signs of esophagitis.  The endoscope was advanced into the gastric pouch.  The gastrojejunal anastomosis was widely patent.  There is no exposed staples.  I was able to advance the endoscope easily into the Roux limb.  The endoscope preferentially advanced into the Roux limb as opposed to the blind end of the Roux limb.  There was not much of a blind end of the Roux limb.  I advance the scope further down into the Roux limb  and there is no abnormal pathology.  The endoscope was pulled back again visualizing the gastrojejunal anastomosis which appeared normal without any signs of marginal ulcer or exposed staples.  The endoscope was used to decompress the Roux limb and gastric pouch and the endoscope was pulled back and withdrawn from the patient.  All needle, instrument, and sponge counts were correct x 2.  There were no immediate complications.  Dermabond was used to seal the skin incisions.  Patient was extubated and taken to recovery room in stable condition  PLAN OF CARE: Discharge to home after PACU  PATIENT DISPOSITION:  PACU - hemodynamically stable.   Delay start of Pharmacological VTE agent (>24hrs) due to surgical blood loss or risk of bleeding:  not applicable  Mary Sella. Andrey Campanile, MD, FACS General, Bariatric, & Minimally Invasive Surgery [facility], [place]

## 2022-11-02 ENCOUNTER — Other Ambulatory Visit: Payer: Self-pay | Admitting: Gastroenterology

## 2022-11-02 ENCOUNTER — Encounter (HOSPITAL_COMMUNITY): Payer: Self-pay | Admitting: General Surgery

## 2022-11-04 ENCOUNTER — Emergency Department (HOSPITAL_COMMUNITY): Payer: Medicaid Other

## 2022-11-04 ENCOUNTER — Other Ambulatory Visit: Payer: Self-pay

## 2022-11-04 ENCOUNTER — Encounter (HOSPITAL_COMMUNITY): Payer: Self-pay | Admitting: Emergency Medicine

## 2022-11-04 ENCOUNTER — Emergency Department (HOSPITAL_COMMUNITY)
Admission: EM | Admit: 2022-11-04 | Discharge: 2022-11-04 | Disposition: A | Discharge status: Home / Self Care | Payer: Medicaid Other | Attending: Emergency Medicine | Admitting: Emergency Medicine

## 2022-11-04 DIAGNOSIS — R0602 Shortness of breath: Secondary | ICD-10-CM | POA: Diagnosis not present

## 2022-11-04 DIAGNOSIS — R0789 Other chest pain: Secondary | ICD-10-CM

## 2022-11-04 DIAGNOSIS — R079 Chest pain, unspecified: Secondary | ICD-10-CM | POA: Diagnosis not present

## 2022-11-04 LAB — CBC
HCT: 32 % — ABNORMAL LOW (ref 36.0–46.0)
Hemoglobin: 9.9 g/dL — ABNORMAL LOW (ref 12.0–15.0)
MCH: 25.6 pg — ABNORMAL LOW (ref 26.0–34.0)
MCHC: 30.9 g/dL (ref 30.0–36.0)
MCV: 82.7 fL (ref 80.0–100.0)
Platelets: 263 10*3/uL (ref 150–400)
RBC: 3.87 MIL/uL (ref 3.87–5.11)
RDW: 13.4 % (ref 11.5–15.5)
WBC: 5.2 10*3/uL (ref 4.0–10.5)
nRBC: 0 % (ref 0.0–0.2)

## 2022-11-04 LAB — BASIC METABOLIC PANEL
Anion gap: 7 (ref 5–15)
BUN: 10 mg/dL (ref 6–20)
CO2: 27 mmol/L (ref 22–32)
Calcium: 9.1 mg/dL (ref 8.9–10.3)
Chloride: 104 mmol/L (ref 98–111)
Creatinine, Ser: 0.68 mg/dL (ref 0.44–1.00)
GFR, Estimated: >60 mL/min (ref >60)
Glucose, Bld: 95 mg/dL (ref 70–99)
Potassium: 4.1 mmol/L (ref 3.5–5.1)
Sodium: 138 mmol/L (ref 135–145)

## 2022-11-04 LAB — TROPONIN I (HIGH SENSITIVITY)
Troponin I (High Sensitivity): <2 ng/L (ref <18)
Troponin I (High Sensitivity): <2 ng/L (ref <18)

## 2022-11-04 MED ORDER — IOHEXOL 350 MG/ML SOLN
80.0000 mL | Freq: Once | INTRAVENOUS | Status: AC | PRN
  Administered 2022-11-04: 80 mL via INTRAVENOUS

## 2022-11-04 MED ORDER — ALUM & MAG HYDROXIDE-SIMETH 200-200-20 MG/5ML PO SUSP
30.0000 mL | Freq: Once | ORAL | Status: AC
  Administered 2022-11-04: 30 mL via ORAL
  Filled 2022-11-04: qty 30

## 2022-11-04 NOTE — ED Notes (Signed)
IV access fail x2

## 2022-11-04 NOTE — ED Provider Notes (Signed)
[name] Provider Note   CSN: [id number] Arrival date & time: 11/04/22  1505     History  Chief Complaint  Patient presents with   Shortness of Breath   Chest Pain    [name] is a 47 y.o. female.  The history is provided by the patient and medical records. No language interpreter was used.  Shortness of Breath Associated symptoms: chest pain   Chest Pain Associated symptoms: shortness of breath      47 year old female significant history of GERD, migraine, anxiety, prediabetes, obesity presenting today with complaints of shortness of breath.  Patient with history of laparoscopic gastric bypass on 11/23 who had a laparoscopic diagnostic along with laparoscopic closure of Petersons space upper GI endoscopy done by Dr. Andrey Campanile on 6/28.  She is here with acute onset of shortness of breath since last night.  States she is having some shortness of breath, and pleuritic chest pain which keeps her up at night.  She feels uncomfortable.  This is new for her.  Endorsing chills without fever.  Denies any cough or hemoptysis no nausea vomit diarrhea no worsening abdominal pain or back pain.  No prior history of PE or DVT no leg swelling or calf pain.  No history of asthma or COPD.  Home Medications Prior to Admission medications   Medication Sig Start Date End Date Taking? Authorizing Provider  acetaminophen (TYLENOL) 325 MG tablet Take 2 tablets (650 mg total) by mouth every 6 (six) hours as needed. 04/16/22   Rising, Lurena Joiner, PA-C  albuterol (VENTOLIN HFA) 108 (90 Base) MCG/ACT inhaler Inhale 1-2 puffs into the lungs every 6 (six) hours as needed for wheezing or shortness of breath. 08/15/22   Gustavus Bryant, FNP  calcium carbonate (OS-CAL - DOSED IN MG OF ELEMENTAL CALCIUM) 1250 (500 Ca) MG tablet Take 1 tablet by mouth daily with breakfast.    [provider]  clonazePAM (KLONOPIN) 1 MG tablet Take 0.5-1 mg by mouth at bedtime.  01/01/22   [provider]  EPINEPHrine (EPIPEN 2-PAK) 0.3 mg/0.3 mL IJ SOAJ injection Inject 0.3 mg into the muscle as needed for anaphylaxis. 03/21/21   Verlee Monte, MD  esomeprazole (NEXIUM) 40 MG capsule TAKE 1 CAPSULE BY MOUTH TWICE A DAY BEFORE A MEAL. PLEASE SCHEDULE A FOLLOW-UP APPOINTMENT FOR FURTHER REFILLS 10/11/22   Armbruster, Willaim Rayas, MD  HYDROcodone-acetaminophen (NORCO) 7.5-325 MG tablet Take 1 tablet by mouth every 8 (eight) hours as needed for moderate pain. 10/01/22   [provider]  Multiple Vitamins-Minerals (BARIATRIC MULTIVITAMINS/IRON PO) Take 1 capsule by mouth in the morning and at bedtime.    [provider]  ondansetron (ZOFRAN) 4 MG tablet Take 1 tablet (4 mg total) by mouth every 6 (six) hours. 11/01/22   Gaynelle Adu, MD  RESTASIS 0.05 % ophthalmic emulsion Place 1 drop into both eyes 2 (two) times daily as needed (dryness). 11/04/19   [provider]      Allergies    Aspirin, Ibuprofen, Salmon [fish allergy], and Zoledronic acid    Review of Systems   Review of Systems  Respiratory:  Positive for shortness of breath.   Cardiovascular:  Positive for chest pain.  All other systems reviewed and are negative.   Physical Exam Updated Vital Signs BP 119/85 (BP Location: Right Arm)   Pulse 68   Temp 99.6 F (37.6 C) (Oral)   Resp 11   Ht 5\' 4"  (1.626 m)  Wt 89 kg   LMP 12/27/2003   SpO2 100%   BMI 33.68 kg/m  Physical Exam Vitals and nursing note reviewed.  Constitutional:      General: She is not in acute distress.    Appearance: She is well-developed.  HENT:     Head: Atraumatic.  Eyes:     Conjunctiva/sclera: Conjunctivae normal.  Cardiovascular:     Rate and Rhythm: Normal rate and regular rhythm.  Pulmonary:     Effort: Pulmonary effort is normal.     Breath sounds: No decreased breath sounds, wheezing, rhonchi or rales.     Comments: Mildly tachypneic Chest:     Chest wall: No tenderness.  Abdominal:      Palpations: Abdomen is soft.     Tenderness: There is no abdominal tenderness.     Comments: Laparoscopic surgical site on the abdomen without any significant tenderness noted.  Some mild erythema noted to the left lower laparoscopic surgical site  Musculoskeletal:     Cervical back: Neck supple.     Right lower leg: No edema.     Left lower leg: No edema.  Skin:    Findings: No rash.  Neurological:     Mental Status: She is alert.  Psychiatric:        Mood and Affect: Mood normal.     ED Results / Procedures / Treatments   Labs (all labs ordered are listed, but only abnormal results are displayed) Labs Reviewed  CBC - Abnormal; Notable for the following components:      Result Value   Hemoglobin 9.9 (*)    HCT 32.0 (*)    MCH 25.6 (*)    All other components within normal limits  BASIC METABOLIC PANEL  TROPONIN I (HIGH SENSITIVITY)  TROPONIN I (HIGH SENSITIVITY)    EKG None  Date: 11/04/2022  Rate: 67  Rhythm: normal sinus rhythm  QRS Axis: normal  Intervals: normal  ST/T Wave abnormalities: normal  Conduction Disutrbances: none  Narrative Interpretation:   Old EKG Reviewed: No significant changes noted    Radiology CT Angio Chest PE W and/or Wo Contrast  Result Date: 11/04/2022 CLINICAL DATA:  Shortness of breath and chest tightness starting last night. EXAM: CT ANGIOGRAPHY CHEST WITH CONTRAST TECHNIQUE: Multidetector CT imaging of the chest was performed using the standard protocol during bolus administration of intravenous contrast. Multiplanar CT image reconstructions and MIPs were obtained to evaluate the vascular anatomy. RADIATION DOSE REDUCTION: This exam was performed according to the departmental dose-optimization program which includes automated exposure control, adjustment of the mA and/or kV according to patient size and/or use of iterative reconstruction technique. CONTRAST:  80mL OMNIPAQUE IOHEXOL 350 MG/ML SOLN COMPARISON:  Cardiac CT 10/08/2019  and CTA chest 02/19/2017 FINDINGS: Cardiovascular: No filling defect is identified in the pulmonary arterial tree to suggest pulmonary embolus. No acute vascular findings. Mediastinum/Nodes: Stable mild prominence of anterior mediastinal tissue without a masslike appearance, likely from residual thymus. Lungs/Pleura: Unremarkable Upper Abdomen: Postoperative findings in the stomach. Musculoskeletal: Mild upper thoracic spondylosis. Review of the MIP images confirms the above findings. IMPRESSION: 1. No filling defect is identified in the pulmonary arterial tree to suggest pulmonary embolus. 2. Chronically stable mild prominence of anterior mediastinal tissue without a masslike appearance, likely from residual thymus. 3. Postoperative findings in the stomach. 4. Mild upper thoracic spondylosis. Electronically Signed   By: Gaylyn Rong M.D.   On: 11/04/2022 18:26   DG Chest 2 View  Result Date: 11/04/2022 CLINICAL DATA:  Chest pain and shortness of breath EXAM: CHEST - 2 VIEW COMPARISON:  Chest x-ray dated November 23, 2022 FINDINGS: The heart size and mediastinal contours are within normal limits. Both lungs are clear. The visualized skeletal structures are unremarkable. IMPRESSION: No active cardiopulmonary disease. Electronically Signed   By: Allegra Lai M.D.   On: 11/04/2022 16:09    Procedures Procedures    Medications Ordered in ED Medications  iohexol (OMNIPAQUE) 350 MG/ML injection 80 mL (80 mLs Intravenous Contrast Given 11/04/22 1739)  alum & mag hydroxide-simeth (MAALOX/MYLANTA) 200-200-20 MG/5ML suspension 30 mL (30 mLs Oral Given 11/04/22 1954)    ED Course/ Medical Decision Making/ A&P                             Medical Decision Making Amount and/or Complexity of Data Reviewed Labs: ordered. Radiology: ordered.  Risk OTC drugs. Prescription drug management.   BP 118/72   Pulse (!) 116   Temp 99.6 F (37.6 C) (Oral)   Resp (!) 23   Ht 5\' 4"  (1.626 m)   Wt 89 kg    LMP 12/27/2003   SpO2 98%   BMI 33.68 kg/m   31:24 PM 47 year old female significant history of GERD, migraine, anxiety, prediabetes, obesity presenting today with complaints of shortness of breath.  Patient with history of laparoscopic gastric bypass on 11/23 who had a laparoscopic diagnostic along with laparoscopic closure of Petersons space upper GI endoscopy done by Dr. Andrey Campanile on 6/28.  She is here with acute onset of shortness of breath since last night.  States she is having some shortness of breath, and pleuritic chest pain which keeps her up at night.  She feels uncomfortable.  This is new for her.  Endorsing chills without fever.  Denies any cough or hemoptysis no nausea vomit diarrhea no worsening abdominal pain or back pain.  No prior history of PE or DVT no leg swelling or calf pain.  No history of asthma or COPD.  On exam, obese female laying in bed mildly tachypneic.  Heart with tachycardia, lungs are clear to auscultation bilaterally abdomen is soft with laparoscopic surgical site on the abdomen.  There are some mild erythema noted to the left lower site without significant tenderness.  Vital signs notable for heart rate of 116, respiratory of 23, patient has an oral temperature of 99.6, and O2 sat is 98% on room air.  Due to recent surgery and now patient developed acute onset of shortness of breath, will obtain chest CTA to rule out PE.  -Labs ordered, independently viewed and interpreted by me.  Labs remarkable for Hgb 9.9, near baseline. Normal delta trop doubt ACS.  -The patient was maintained on a cardiac monitor.  I personally viewed and interpreted the cardiac monitored which showed an underlying rhythm of: NSR -Imaging independently viewed and interpreted by me and I agree with radiologist's interpretation.  Result remarkable for chest CTA without evidence of PE or PNA -This patient presents to the ED for concern of sob, this involves an extensive number of treatment options,  and is a complaint that carries with it a high risk of complications and morbidity.  The differential diagnosis includes PE, pleurisy, viral illness, pna, ptx, pleural effusion, dissection, acs -Co morbidities that complicate the patient evaluation includes obesity, GERD, asthma, prediabetes -Treatment includes GI cocktail -Reevaluation of the patient after these medicines showed that the patient improved -PCP office notes or outside notes reviewed -Escalation  to admission/observation considered: patients feels much better, is comfortable with discharge, and will follow up with PCP -Prescription medication considered, patient comfortable with her current abx including cephalexin which was prescribed for UTI.  Her skin infection on her abdominal wall will likely benefit from the same abx -Social Determinant of Health considered   8:05 PM Workup overall reassuring, no evidence to suggest ACS, PE, or other acute emergent medical condition.  At this time I felt patient stable to be discharged home with close outpatient follow-up with your doctor for further care.  Return precaution given.         Final Clinical Impression(s) / ED Diagnoses Final diagnoses:  Shortness of breath  Atypical chest pain    Rx / DC Orders ED Discharge Orders     None         Otho Perl 11/04/22 2007    Linwood Dibbles, MD 11/05/22 [phone]

## 2022-11-04 NOTE — ED Triage Notes (Signed)
Pt endorses SOB and chest tightness since last night. Pt reports having endoscopy on Friday but unsure of results. Denies cardiac hx.

## 2022-11-04 NOTE — Discharge Instructions (Addendum)
You have been evaluated for your symptoms.  Fortunately no evidence of blood clot in your lungs or pneumonia.  There is some redness to your abdominal wall at the surgical site as this may be signs of a skin infection (cellulitis).  Continue to take antibiotic that was recently prescribed and follow-up closely with your doctor for recheck.  Return if you have any concern.

## 2022-11-05 ENCOUNTER — Encounter: Payer: Self-pay | Admitting: Nurse Practitioner

## 2022-11-05 ENCOUNTER — Telehealth: Payer: Self-pay

## 2022-11-05 ENCOUNTER — Ambulatory Visit: Payer: Medicaid Other | Admitting: Nurse Practitioner

## 2022-11-05 VITALS — BP 110/70 | HR 65 | Temp 99.0°F | Ht 64.0 in | Wt 193.4 lb

## 2022-11-05 DIAGNOSIS — Z2821 Immunization not carried out because of patient refusal: Secondary | ICD-10-CM | POA: Diagnosis not present

## 2022-11-05 DIAGNOSIS — E6609 Other obesity due to excess calories: Secondary | ICD-10-CM

## 2022-11-05 DIAGNOSIS — Z09 Encounter for follow-up examination after completed treatment for conditions other than malignant neoplasm: Secondary | ICD-10-CM

## 2022-11-05 DIAGNOSIS — E782 Mixed hyperlipidemia: Secondary | ICD-10-CM | POA: Diagnosis not present

## 2022-11-05 DIAGNOSIS — Z6833 Body mass index (BMI) 33.0-33.9, adult: Secondary | ICD-10-CM | POA: Diagnosis not present

## 2022-11-05 DIAGNOSIS — R0789 Other chest pain: Secondary | ICD-10-CM

## 2022-11-05 DIAGNOSIS — R7309 Other abnormal glucose: Secondary | ICD-10-CM | POA: Diagnosis not present

## 2022-11-05 NOTE — Transitions of Care (Post Inpatient/ED Visit) (Signed)
   11/05/2022  Name: [name] MRN: [id number] DOB: 1975/11/04  Today's TOC FU Call Status: Today's TOC FU Call Status:: Unsuccessul Call (1st Attempt) Unsuccessful Call (1st Attempt) Date: 11/05/22  Attempted to reach the patient regarding the most recent Inpatient/ED visit.  Follow Up Plan: Additional outreach attempts will be made to reach the patient to complete the Transitions of Care (Post Inpatient/ED visit) call.   Signature Lisabeth Devoid, [place]

## 2022-11-05 NOTE — Transitions of Care (Post Inpatient/ED Visit) (Signed)
11/05/2022  Name: BERT BARNABY MRN: [id number] DOB: 08/26/1975  Today's TOC FU Call Status: Today's TOC FU Call Status:: Successful TOC FU Call Competed TOC FU Call Complete Date: 11/05/22  Transition Care Management Follow-up Telephone Call Date of Discharge: 11/04/22 Discharge Facility: [facility]) Type of Discharge: Emergency Department How have you been since you were released from the hospital?: Same Any questions or concerns?: No  Items Reviewed: Did you receive and understand the discharge instructions provided?: Yes Medications obtained,verified, and reconciled?: Yes (Medications Reviewed) Any new allergies since your discharge?: No Dietary orders reviewed?: No Do you have support at home?: Yes People in Home: child(ren), adult  Medications Reviewed Today: Medications Reviewed Today     Reviewed by Marlyn Corporal, CMA (Certified Medical Assistant) on 11/05/22 at 1437  Med List Status: <None>   Medication Order Taking? Sig Documenting Provider Last Dose Status Informant  acetaminophen (TYLENOL) 325 MG tablet [id number] Yes Take 2 tablets (650 mg total) by mouth every 6 (six) hours as needed. Rising, Lurena Joiner, PA-C Taking Active Self  albuterol (VENTOLIN HFA) 108 (90 Base) MCG/ACT inhaler [id number] Yes Inhale 1-2 puffs into the lungs every 6 (six) hours as needed for wheezing or shortness of breath. [place], [place], [place] Taking Active Self  calcium carbonate (OS-CAL - DOSED IN MG OF ELEMENTAL CALCIUM) 1250 (500 Ca) MG tablet [id number] Yes Take 1 tablet by mouth daily with breakfast. [provider] Taking Active Self  clonazePAM (KLONOPIN) 1 MG tablet [id number] Yes Take 0.5-1 mg by mouth at bedtime. [provider] Taking Active Self  EPINEPHrine (EPIPEN 2-PAK) 0.3 mg/0.3 mL IJ SOAJ injection [id number] Yes Inject 0.3 mg into the muscle as needed for anaphylaxis. Verlee Monte, MD Taking Active Self  esomeprazole (NEXIUM) 40 MG capsule [id number] Yes  TAKE 1 CAPSULE BY MOUTH TWICE A DAY BEFORE A MEAL. PLEASE SCHEDULE A FOLLOW-UP APPOINTMENT FOR FURTHER REFILLS Armbruster, Willaim Rayas, MD Taking Active Self  HYDROcodone-acetaminophen (NORCO) 7.5-325 MG tablet [id number] Yes Take 1 tablet by mouth every 8 (eight) hours as needed for moderate pain. [provider] Taking Active Self  Multiple Vitamins-Minerals (BARIATRIC MULTIVITAMINS/IRON PO) [id number] Yes Take 1 capsule by mouth in the morning and at bedtime. [provider] Taking Active Self  ondansetron (ZOFRAN) 4 MG tablet [id number] Yes Take 1 tablet (4 mg total) by mouth every 6 (six) hours. Gaynelle Adu, MD Taking Active   RESTASIS 0.05 % ophthalmic emulsion [id number] Yes Place 1 drop into both eyes 2 (two) times daily as needed (dryness). [provider] Taking Active Self           Med Note Darryl Nestle   Sat Dec 08, 2021 11:47 AM)              Home Care and Equipment/Supplies: Were Home Health Services Ordered?: No Any new equipment or medical supplies ordered?: No  Functional Questionnaire: Do you need assistance with bathing/showering or dressing?: No Do you need assistance with meal preparation?: No Do you need assistance with eating?: No Do you have difficulty maintaining continence: No Do you need assistance with getting out of bed/getting out of a chair/moving?: No Do you have difficulty managing or taking your medications?: No  Follow up appointments reviewed: PCP Follow-up appointment confirmed?: Yes Date of PCP follow-up appointment?: 11/05/22 Follow-up Provider: [facility] Follow-up appointment confirmed?: No Do you need transportation to your follow-up appointment?: No Do you understand care options if your condition(s) worsen?: Yes-patient verbalized  understanding    SIGNATURE Lisabeth Devoid, CMA

## 2022-11-05 NOTE — Progress Notes (Unsigned)
Madelaine Bhat, CMA,acting as a Neurosurgeon for [name], FNP.,have documented all relevant documentation on the behalf of [name], FNP,as directed by  [name], FNP while in the presence of [name], FNP.  Subjective:  Patient ID: [name] , female    DOB: Apr 26, 1976 , 47 y.o.   MRN: [id number]  Chief Complaint  Patient presents with   Hyperlipidemia    HPI  Patient presents today for a cholesterol check and Pre DM check, patient reports compliance with medications and has no other concerns today.   Patient also went to the hospital yesterday on 11/04/2022, patient reports she had SOB and chest tightens for a few days then went to the hospital. Patient reports she still feels about the same. She has pain when taking a deep breath. She also has discomfort to her throat when she swallows.   She had an endoscopy done last week due to having left side abdomen pain. She is to f/u July 18th. She states she did not feel better after having the GI cocktail.   BP Readings from Last 3 Encounters: 11/05/22 : 110/70 11/04/22 : 112/79 11/01/22 : 112/78       Past Medical History:  Diagnosis Date   Allergy    Anemia    Anxiety    Apnea 12/08/2019   Arthritis    Asthma    Back pain    Constipation    Exertional dyspnea 03/14/2020   Fatty liver    GERD (gastroesophageal reflux disease)    H/O: hysterectomy    Hx of endometriosis    Inappropriate sinus tachycardia 12/08/2019   Insomnia    Joint pain    Lactose intolerance    Migraine    Morbid obesity (HCC) 12/08/2019   Osteoporosis    Pneumonia    Pre-diabetes    Urticaria    Vitamin D deficiency      Family History  Problem Relation Age of Onset   Diabetes Mother    Hypertension Mother    Hyperlipidemia Mother    Sleep apnea Mother    Thyroid disease Sister    Dementia Father    Heart attack Maternal Grandmother    Stroke Maternal Grandfather    Asthma Son    Healthy Son    Asthma Daughter     Healthy Daughter    Colon cancer Neg Hx    Colon polyps Neg Hx    Esophageal cancer Neg Hx    Rectal cancer Neg Hx    Stomach cancer Neg Hx      Current Outpatient Medications:    acetaminophen (TYLENOL) 325 MG tablet, Take 2 tablets (650 mg total) by mouth every 6 (six) hours as needed., Disp: 30 tablet, Rfl: 0   albuterol (VENTOLIN HFA) 108 (90 Base) MCG/ACT inhaler, Inhale 1-2 puffs into the lungs every 6 (six) hours as needed for wheezing or shortness of breath., Disp: 1 each, Rfl: 0   calcium carbonate (OS-CAL - DOSED IN MG OF ELEMENTAL CALCIUM) 1250 (500 Ca) MG tablet, Take 1 tablet by mouth daily with breakfast., Disp: , Rfl:    clonazePAM (KLONOPIN) 1 MG tablet, Take 0.5-1 mg by mouth at bedtime., Disp: , Rfl:    EPINEPHrine (EPIPEN 2-PAK) 0.3 mg/0.3 mL IJ SOAJ injection, Inject 0.3 mg into the muscle as needed for anaphylaxis., Disp: 2 each, Rfl: 2   esomeprazole (NEXIUM) 40 MG capsule, TAKE 1 CAPSULE BY MOUTH TWICE A DAY BEFORE A MEAL. PLEASE SCHEDULE A FOLLOW-UP  APPOINTMENT FOR FURTHER REFILLS, Disp: 60 capsule, Rfl: 0   HYDROcodone-acetaminophen (NORCO) 7.5-325 MG tablet, Take 1 tablet by mouth every 8 (eight) hours as needed for moderate pain., Disp: , Rfl:    Multiple Vitamins-Minerals (BARIATRIC MULTIVITAMINS/IRON PO), Take 1 capsule by mouth in the morning and at bedtime., Disp: , Rfl:    ondansetron (ZOFRAN) 4 MG tablet, Take 1 tablet (4 mg total) by mouth every 6 (six) hours., Disp: 12 tablet, Rfl: 0   RESTASIS 0.05 % ophthalmic emulsion, Place 1 drop into both eyes 2 (two) times daily as needed (dryness)., Disp: , Rfl:    cephALEXin (KEFLEX) 500 MG capsule, Take 500 mg by mouth 3 (three) times daily., Disp: , Rfl:    diclofenac (VOLTAREN) 75 MG EC tablet, Take 75 mg by mouth 2 (two) times daily., Disp: , Rfl:    nitrofurantoin, macrocrystal-monohydrate, (MACROBID) 100 MG capsule, Take 100 mg by mouth daily., Disp: , Rfl:    promethazine-dextromethorphan (PROMETHAZINE-DM)  6.25-15 MG/5ML syrup, Take 5 mLs by mouth 4 (four) times daily as needed for cough., Disp: 118 mL, Rfl: 0   tiZANidine (ZANAFLEX) 4 MG tablet, Take 4 mg by mouth 2 (two) times daily., Disp: , Rfl:    Allergies  Allergen Reactions   Aspirin Other (See Comments)    MD advised pt to not take med   Ibuprofen Other (See Comments)    MD advised pt to not take med    Rosanne Ashing Allergy] Swelling   Zoledronic Acid Other (See Comments)    Muscle aches     Review of Systems  Constitutional: Negative.  Negative for fatigue.  Respiratory: Negative.    Cardiovascular: Negative.   Neurological:  Negative for dizziness and headaches.  Psychiatric/Behavioral: Negative.       Today's Vitals   11/05/22 1438  BP: 110/70  Pulse: 65  Temp: 99 F (37.2 C)  TempSrc: Oral  Weight: 193 lb 6.4 oz (87.7 kg)  Height: 5\' 4"  (1.626 m)  PainSc: 5   PainLoc: Abdomen   Body mass index is 33.2 kg/m.  Wt Readings from Last 3 Encounters:  11/11/22 189 lb 4.8 oz (85.9 kg)  11/05/22 193 lb 6.4 oz (87.7 kg)  11/04/22 196 lb 3.4 oz (89 kg)     Objective:  Physical Exam Vitals reviewed.  Constitutional:      General: She is not in acute distress.    Appearance: Normal appearance. She is obese.  Cardiovascular:     Rate and Rhythm: Normal rate and regular rhythm.     Pulses: Normal pulses.     Heart sounds: Normal heart sounds. No murmur heard. Pulmonary:     Effort: Pulmonary effort is normal. No respiratory distress.     Breath sounds: No wheezing.  Neurological:     General: No focal deficit present.     Mental Status: She is alert and oriented to person, place, and time.     Cranial Nerves: No cranial nerve deficit.     Motor: No weakness.  Psychiatric:        Mood and Affect: Mood normal.        Behavior: Behavior normal.        Thought Content: Thought content normal.        Judgment: Judgment normal.         Assessment And Plan:  Abnormal glucose Assessment & Plan: HgbA1c is  slightly elevated, continue focusing on healthy diet low in sugar and starches.   Orders: -  Hemoglobin A1c  Mixed hyperlipidemia Assessment & Plan: A low fat, low cholesterol is discussed with the patient, and a written copy is given to her.   Orders: -     Lipid panel  Atypical chest pain Assessment & Plan: She was seen at ER on 11/04/2022 with full work up. She may need to see Cardiology for further evaluation. Has seen Dr. Tresa Endo in the past. TCM Performed. A member of the clinical team spoke with the patient upon dischare. Discharge summary was reviewed in full detail during the visit. Meds reconciled and compared to discharge meds. Medication list is updated and reviewed with the patient.  Greater than 50% face to face time was spent in counseling an coordination of care.  All questions were answered to the satisfaction of the patient.      Class 1 obesity due to excess calories with serious comorbidity and body mass index (BMI) of 33.0 to 33.9 in adult Assessment & Plan: She is encouraged to strive for BMI less than 30 to decrease cardiac risk. Advised to aim for at least 150 minutes of exercise per week.    COVID-19 vaccination declined Assessment & Plan: Declines covid 19 vaccine. Discussed risk of covid 9 and if she changes her mind about the vaccine to call the office. Education has been provided regarding the importance of this vaccine but patient still declined. Advised may receive this vaccine at local pharmacy or Health Dept.or vaccine clinic. Aware to provide a copy of the vaccination record if obtained from local pharmacy or Health Dept.  Encouraged to take multivitamin, vitamin d, vitamin c and zinc to increase immune system. Aware can call office if would like to have vaccine here at office. Verbalized acceptance and understanding.    Hospital discharge follow-up    Return for 6 month chol check.  Patient was given opportunity to ask questions. Patient  verbalized understanding of the plan and was able to repeat key elements of the plan. All questions were answered to their satisfaction.    Jeanell Sparrow, FNP, have reviewed all documentation for this visit. The documentation on 11/25/22 for the exam, diagnosis, procedures, and orders are all accurate and complete.   IF YOU HAVE BEEN REFERRED TO A SPECIALIST, IT MAY TAKE 1-2 WEEKS TO SCHEDULE/PROCESS THE REFERRAL. IF YOU HAVE NOT HEARD FROM US/SPECIALIST IN TWO WEEKS, PLEASE GIVE [place] A CALL AT [phone].

## 2022-11-06 LAB — LIPID PANEL
Chol/HDL Ratio: 2.7 ratio (ref 0.0–4.4)
Cholesterol, Total: 147 mg/dL (ref 100–199)
HDL: 54 mg/dL (ref >39)
LDL Chol Calc (NIH): 79 mg/dL (ref 0–99)
Triglycerides: 68 mg/dL (ref 0–149)
VLDL Cholesterol Cal: 14 mg/dL (ref 5–40)

## 2022-11-06 LAB — HEMOGLOBIN A1C
Est. average glucose Bld gHb Est-mCnc: 111 mg/dL
Hgb A1c MFr Bld: 5.5 % (ref 4.8–5.6)

## 2022-11-10 ENCOUNTER — Other Ambulatory Visit: Payer: Self-pay | Admitting: Gastroenterology

## 2022-11-11 ENCOUNTER — Encounter: Payer: Self-pay | Admitting: Dietician

## 2022-11-11 ENCOUNTER — Encounter: Payer: Medicaid Other | Attending: General Surgery | Admitting: Dietician

## 2022-11-11 VITALS — Ht 64.0 in | Wt 189.3 lb

## 2022-11-11 DIAGNOSIS — E669 Obesity, unspecified: Secondary | ICD-10-CM | POA: Diagnosis not present

## 2022-11-11 DIAGNOSIS — F330 Major depressive disorder, recurrent, mild: Secondary | ICD-10-CM | POA: Diagnosis not present

## 2022-11-11 DIAGNOSIS — F411 Generalized anxiety disorder: Secondary | ICD-10-CM | POA: Diagnosis not present

## 2022-11-11 NOTE — Progress Notes (Signed)
Bariatric Nutrition Follow-Up Visit Medical Nutrition Therapy   Surgery date: 03/19/22 Surgery type: RYGB  NUTRITION ASSESSMENT  Anthropometrics  Start weight at [facility]: 241.4 lbs (date: 11/22/2021)  Height: 64 in Weight today: 189.3 lbs.  Clinical  Medical hx: Allergies, Anemia, Anxiety, arthritis, asthma, GERD, kidney disease, osteoporosis Medications: Esomeprazole, one a day vitamin, vit D, Calcium  Labs: Hemoglobin 9.9; Hematocrit 32.0; Vit D 110.0; B12 2,200; A1c 5.5 Notable signs/symptoms: nothing noted Any previous deficiencies? No Bowel Habits: Every day to every other day no complaints   Body Composition Scale 04/02/22 05/21/2022 08/20/2022 11/11/2022  Current Body Weight 228.0 215.1 198.6 189.3  Total Body Fat % 43.4 41.5 39.4 37.9  Visceral Fat 14 13 11 10   Fat-Free Mass % 56.5 58.4 60.5 62.0   Total Body Water % 42.7 43.7 44.7 45.5  Muscle-Mass lbs 31.1 31.3 31.0 30.9  BMI 39.0 36.8 33.9 32.3  Body Fat Displacement             Torso  lbs 61.3 55.3 48.4 44.4         Left Leg  lbs 12.2 11.0 9.6 8.8         Right Leg  lbs 12.2 11.0 9.6 8.8         Left Arm  lbs 6.1 5.5 4.8 4.4         Right Arm   lbs 6.1 5.5 4.8 4.4     Lifestyle & Dietary Hx  Pt states she June 28th she had a laparoscopy diagnostic, and states she went to the ED for shortness of breath. Pt states she is on an antibiotic for a urinary tract infection from her urologist. Pt asked about BELT moving to the evenings, stating she would interested when it changes. Pt states she  has joined a gym, and stopped going. Pt states she was going 3 days a week when she was going. Pt states her A1c went down to 5.5. Pt states sometimes she forgets she had the procedure and can't eat what is on her plate.  Estimated daily fluid intake: 64 oz Estimated daily protein intake: 60-70 g Supplements: multivitamin and calcium Current average weekly physical activity: walking 2-3 times a week for 30 minutes.  24-Hr  Dietary Recall First Meal: protein shake Snack: cheese stick  Second Meal: tuna fish or salmon patty or cheese stick or peanut butter or chicken salad Snack: quest protein chips, fruit Third Meal: chicken or salmon or tuna, broccoli or corn Snack: sf pop cycle or coffee with protein shake Beverages: water, coffee, vitamin water zero  Post-Op Goals/ Signs/ Symptoms Using straws: no Drinking while eating: no Chewing/swallowing difficulties: no Changes in vision: no Changes to mood/headaches: no Hair loss/changes to skin/nails: no Difficulty focusing/concentrating: no Sweating: no Limb weakness: no Dizziness/lightheadedness: no Palpitations: no  Carbonated/caffeinated beverages: no N/V/D/C/Gas: no Abdominal pain: no Dumping syndrome: no   NUTRITION DIAGNOSIS  Overweight/obesity ([place]-3.3) related to past poor dietary habits and physical inactivity as evidenced by completed bariatric surgery and following dietary guidelines for continued weight loss and healthy nutrition status.  Why you need complex carbohydrates: complex carbohydrates are required to have a healthy diet. Whole grains provide fiber which can help with blood glucose levels and help keep you satiated. Fruits and starchy and non-starch vegetables provide essential vitamins and minerals required for immune function, eyesight support, brain support, bone density, wound healing and many other functions within the body. According to the current evidenced based 2020-2025 Dietary Guidelines for Americans, complex  carbohydrates are part of a healthy eating pattern which is associated with a decreased risk for type 2 diabetes, cancers, and cardiovascular disease.   NUTRITION INTERVENTION Nutrition counseling (C-1) and education (E-2) to facilitate bariatric surgery goals, including: The importance of consuming adequate calories as well as certain nutrients daily due to the body's need for essential vitamins, minerals, and fats The  importance of daily physical activity and to reach a goal of at least 150 minutes of moderate to vigorous physical activity weekly (or as directed by their physician) due to benefits such as increased musculature and improved lab values The importance of intuitive eating specifically learning hunger-satiety cues and understanding the importance of learning a new body: The importance of mindful eating to avoid grazing behaviors   Goals - Continue: take multivitamin (with iron) and calcium - New: aim for at least 2 servings of non-starchy vegetables a day. - Re-engage: increase physical activity; use dumbbells at home; go to gym 3 days a week (cardio and light weights) to prep for BELT program when it changes to evenings.  Handouts Provided Include  Bariatric MyPlate Meal Ideas Handout  Learning Style & Readiness for Change Teaching method utilized: Visual & Auditory  Demonstrated degree of understanding via: Teach Back  Readiness Level: Preparation Barriers to learning/adherence to lifestyle change: nothing identified  RD's Notes for Next Visit Assess adherence to pt chosen goals  MONITORING & EVALUATION Dietary intake, weekly physical activity, body weight.  Next Steps Patient is to follow-up in 3 months for 9 month post-op follow-up.

## 2022-11-12 ENCOUNTER — Ambulatory Visit (HOSPITAL_COMMUNITY)
Admission: EM | Admit: 2022-11-12 | Discharge: 2022-11-12 | Disposition: A | Discharge status: Home / Self Care | Payer: Medicaid Other | Attending: Family Medicine | Admitting: Family Medicine

## 2022-11-12 ENCOUNTER — Encounter (HOSPITAL_COMMUNITY): Payer: Self-pay | Admitting: Emergency Medicine

## 2022-11-12 DIAGNOSIS — B349 Viral infection, unspecified: Secondary | ICD-10-CM | POA: Diagnosis not present

## 2022-11-12 DIAGNOSIS — Z1152 Encounter for screening for COVID-19: Secondary | ICD-10-CM | POA: Insufficient documentation

## 2022-11-12 MED ORDER — PROMETHAZINE-DM 6.25-15 MG/5ML PO SYRP: 5.0000 mL | ORAL_SOLUTION | Freq: Four times a day (QID) | ORAL | 0 refills | Status: DC | PRN

## 2022-11-12 NOTE — Discharge Instructions (Signed)
You have been tested for COVID-19 today. °If your test returns positive, you will receive a phone call from [place] regarding your results. °Negative test results are not called. °Both positive and negative results area always visible on MyChart. °If you do not have a MyChart account, sign up instructions are provided in your discharge papers. °Please do not hesitate to contact us should you have questions or concerns. ° °

## 2022-11-12 NOTE — ED Triage Notes (Signed)
Pt reports for 2 days had chills, fevers taken tylenol. Reports sore throat but that happened after endoscopy last week. Was on antibiotics for an infection.

## 2022-11-13 LAB — SARS CORONAVIRUS 2 (TAT 6-24 HRS): SARS Coronavirus 2: NEGATIVE

## 2022-11-13 NOTE — ED Provider Notes (Signed)
[facility]ENTER   [id number] 11/12/22 Arrival Time: 1841  ASSESSMENT & PLAN:  1. Viral illness    Mild cough. Would like Rx cough medicine. Discussed typical duration of viral illness. COVID negative.  Results for orders placed or performed during the hospital encounter of 11/12/22  SARS CORONAVIRUS 2 (TAT 6-24 HRS) Anterior Nasal Swab   Specimen: Anterior Nasal Swab  Result Value Ref Range   SARS Coronavirus 2 NEGATIVE NEGATIVE    Discharge Medication List as of 11/12/2022  7:53 PM     START taking these medications   Details  promethazine-dextromethorphan (PROMETHAZINE-DM) 6.25-15 MG/5ML syrup Take 5 mLs by mouth 4 (four) times daily as needed for cough., Starting Tue 11/12/2022, Normal         Follow-up Information     [name], FNP.   Specialty: General Practice Why: As needed. Contact information: [address] [place] [address] [phone]                 Reviewed expectations re: course of current medical issues. Questions answered. Outlined signs and symptoms indicating need for more acute intervention. Understanding verbalized. After Visit Summary given.   SUBJECTIVE: History from: Patient. [name] is a 47 y.o. female. Reports: Pt reports for 2 days had chills, fevers taken tylenol. Reports sore throat but that happened after endoscopy last week. Was on antibiotics for an infection.  Denies: fever and difficulty breathing. Normal PO intake without n/v/d.  OBJECTIVE:  Vitals:   11/12/22 1926  BP: 99/68  Pulse: 63  Resp: 17  Temp: 98.4 F (36.9 C)  TempSrc: Oral  SpO2: 98%    General appearance: alert; no distress Eyes: PERRLA; EOMI; conjunctiva normal HENT: [place]; AT; with nasal congestion; mild throat irritation Neck: supple  Lungs: speaks full sentences without difficulty; unlabored; ctab Extremities: no edema Skin: warm and dry Neurologic: normal gait Psychological: alert and cooperative; normal  mood and affect  Labs: Results for orders placed or performed during the hospital encounter of 11/12/22  SARS CORONAVIRUS 2 (TAT 6-24 HRS) Anterior Nasal Swab   Specimen: Anterior Nasal Swab  Result Value Ref Range   SARS Coronavirus 2 NEGATIVE NEGATIVE   Labs Reviewed  SARS CORONAVIRUS 2 (TAT 6-24 HRS)    Imaging: No results found.  Allergies  Allergen Reactions   Aspirin Other (See Comments)    MD advised pt to not take med   Ibuprofen Other (See Comments)    MD advised pt to not take med    [facility] Allergy] Swelling   Zoledronic Acid Other (See Comments)    Muscle aches    Past Medical History:  Diagnosis Date   Allergy    Anemia    Anxiety    Apnea 12/08/2019   Arthritis    Asthma    Back pain    Constipation    Exertional dyspnea 03/14/2020   Fatty liver    GERD (gastroesophageal reflux disease)    H/O: hysterectomy    Hx of endometriosis    Inappropriate sinus tachycardia 12/08/2019   Insomnia    Joint pain    Lactose intolerance    Migraine    Morbid obesity (HCC) 12/08/2019   Osteoporosis    Pneumonia    Pre-diabetes    Urticaria    Vitamin D deficiency    Social History   Socioeconomic History   Marital status: Divorced    Spouse name: Not on file   Number of children: 2  Years of education: 12th   Highest education level: Not on file  Occupational History   Occupation: Facilities manager: OTHER    Comment: [employer]  Tobacco Use   Smoking status: Never   Smokeless tobacco: Never  Vaping Use   Vaping Use: Never used  Substance and Sexual Activity   Alcohol use: Not Currently    Comment: occasional    Drug use: No   Sexual activity: Not Currently    Birth control/protection: Surgical  Other Topics Concern   Not on file  Social History Narrative   Pt lives in 2 story town home with her son   Has 2 children   High school Paramedic for [employer]   Right handed   Social Determinants of Health    Financial Resource Strain: Not on file  Food Insecurity: No Food Insecurity (03/19/2022)   Hunger Vital Sign    Worried About Running Out of Food in the Last Year: Never true    Ran Out of Food in the Last Year: Never true  Transportation Needs: No Transportation Needs (03/19/2022)   PRAPARE - Administrator, Civil Service (Medical): No    Lack of Transportation (Non-Medical): No  Physical Activity: Not on file  Stress: Not on file  Social Connections: Not on file  Intimate Partner Violence: Not At Risk (03/19/2022)   Humiliation, Afraid, Rape, and Kick questionnaire    Fear of Current or Ex-Partner: No    Emotionally Abused: No    Physically Abused: No    Sexually Abused: No   Family History  Problem Relation Age of Onset   Diabetes Mother    Hypertension Mother    Hyperlipidemia Mother    Sleep apnea Mother    Thyroid disease Sister    Dementia Father    Heart attack Maternal Grandmother    Stroke Maternal Grandfather    Asthma Son    Healthy Son    Asthma Daughter    Healthy Daughter    Colon cancer Neg Hx    Colon polyps Neg Hx    Esophageal cancer Neg Hx    Rectal cancer Neg Hx    Stomach cancer Neg Hx    Past Surgical History:  Procedure Laterality Date   67 HOUR PH STUDY N/A 01/12/2018   Procedure: 24 HOUR PH STUDY;  Surgeon: Napoleon Form, MD;  Location: [facility];  Service: Endoscopy;  Laterality: N/A;   ABDOMINAL HYSTERECTOMY  12/12/2003   ADENOIDECTOMY  05/2011   ANKLE ARTHROSCOPY  12/29/2007   right; with extensive debridement   BLADDER NECK RECONSTRUCTION  01/14/2011   Procedure: BLADDER NECK REPAIR;  Surgeon: Reva Bores, MD;  Location: [facility];  Service: Gynecology;  Laterality: N/A;  Laparoscopic Repair of Incidental Cystotomy   BREAST REDUCTION SURGERY  08/05/2011   Procedure: MAMMARY REDUCTION  (BREAST);  Surgeon: Louisa Second, MD;  Location: [facility];  Service: Plastics;  Laterality: Bilateral;   bilateral   CESAREAN SECTION  12/29/2000; 1994   CHOLECYSTECTOMY     age 52   DILATION AND CURETTAGE OF UTERUS  07/08/2003   open laparoscopy   ESOPHAGEAL MANOMETRY N/A 01/12/2018   Procedure: ESOPHAGEAL MANOMETRY (EM);  Surgeon: Napoleon Form, MD;  Location: [facility];  Service: Endoscopy;  Laterality: N/A;   GASTRIC ROUX-EN-Y N/A 03/19/2022   Procedure: LAPAROSCOPIC ROUX-EN-Y GASTRIC BYPASS WITH UPPER ENDOSCOPY;  Surgeon: Gaynelle Adu, MD;  Location: [facility];  Service: General;  Laterality: N/A;   GIVENS CAPSULE STUDY N/A 06/23/2017   Procedure: GIVENS CAPSULE STUDY;  Surgeon: Benancio Deeds, MD;  Location: [facility];  Service: Gastroenterology;  Laterality: N/A;   HIATAL HERNIA REPAIR N/A 03/19/2022   Procedure: HERNIA REPAIR HIATAL;  Surgeon: Gaynelle Adu, MD;  Location: [facility];  Service: General;  Laterality: N/A;   LAPAROSCOPIC SALPINGOOPHERECTOMY  01/14/2011   left   LAPAROSCOPY N/A 11/01/2022   Procedure: LAPAROSCOPY DIAGNOSTIC; LAPARSCOPIC CLOSURE OF PETERSEN SPACE;  Surgeon: Gaynelle Adu, MD;  Location: [facility];  Service: General;  Laterality: N/A;  DR Gerrit Friends TO ASSIST   PH IMPEDANCE STUDY N/A 01/12/2018   Procedure: PH IMPEDANCE STUDY;  Surgeon: Napoleon Form, MD;  Location: [facility];  Service: Endoscopy;  Laterality: N/A;   RESECTION DISTAL CLAVICAL Right 09/25/2017   Procedure: RESECTION DISTAL CLAVICAL;  Surgeon: Bjorn Pippin, MD;  Location: [facility];  Service: Orthopedics;  Laterality: Right;   RIGHT OOPHORECTOMY  2011   with lysis of adhesions   SHOULDER ACROMIOPLASTY Right 09/25/2017   Procedure: SHOULDER ACROMIOPLASTY;  Surgeon: Bjorn Pippin, MD;  Location: [facility];  Service: Orthopedics;  Laterality: Right;   SHOULDER ARTHROSCOPY WITH DEBRIDEMENT AND BICEP TENDON REPAIR Right 09/25/2017   Procedure: SHOULDER ARTHROSCOPY WITH DEBRIDEMENT AND BICEP TENDON REPAIR;  Surgeon: Bjorn Pippin, MD;  Location: [facility];  Service: Orthopedics;  Laterality: Right;   SHOULDER ARTHROSCOPY WITH ROTATOR CUFF REPAIR Right 09/25/2017   Procedure: SHOULDER ARTHROSCOPY WITH ROTATOR CUFF REPAIR;  Surgeon: Bjorn Pippin, MD;  Location: [facility];  Service: Orthopedics;  Laterality: Right;   TONSILLECTOMY  as a child   TUBAL LIGATION  12/29/2000   UPPER GASTROINTESTINAL ENDOSCOPY     UPPER GI ENDOSCOPY N/A 11/01/2022   Procedure: UPPER GI ENDOSCOPY;  Surgeon: Gaynelle Adu, MD;  Location: [facility];  Service: General;  Laterality: Vertis Kelch, MD 11/13/22 1036

## 2022-11-20 NOTE — Assessment & Plan Note (Signed)
HgbA1c is slightly elevated, continue focusing on healthy diet low in sugar and starches.

## 2022-11-20 NOTE — Assessment & Plan Note (Addendum)
She was seen at ER on 11/04/2022 with full work up. She may need to see Cardiology for further evaluation. Has seen Dr. Tresa Endo in the past. TCM Performed. A member of the clinical team spoke with the patient upon dischare. Discharge summary was reviewed in full detail during the visit. Meds reconciled and compared to discharge meds. Medication list is updated and reviewed with the patient.  Greater than 50% face to face time was spent in counseling an coordination of care.  All questions were answered to the satisfaction of the patient.

## 2022-11-21 ENCOUNTER — Encounter: Payer: Self-pay | Admitting: Nurse Practitioner

## 2022-11-25 DIAGNOSIS — F330 Major depressive disorder, recurrent, mild: Secondary | ICD-10-CM | POA: Diagnosis not present

## 2022-11-25 DIAGNOSIS — F411 Generalized anxiety disorder: Secondary | ICD-10-CM | POA: Diagnosis not present

## 2022-11-25 NOTE — Assessment & Plan Note (Signed)
Declines covid 19 vaccine. Discussed risk of covid 19 and if she changes her mind about the vaccine to call the office. Education has been provided regarding the importance of this vaccine but patient still declined. Advised may receive this vaccine at local pharmacy or Health Dept.or vaccine clinic. Aware to provide a copy of the vaccination record if obtained from local pharmacy or Health Dept.  Encouraged to take multivitamin, vitamin d, vitamin c and zinc to increase immune system. Aware can call office if would like to have vaccine here at office. Verbalized acceptance and understanding.  

## 2022-11-25 NOTE — Assessment & Plan Note (Signed)
A low fat, low cholesterol is discussed with the patient, and a written copy is given to her. 

## 2022-11-25 NOTE — Assessment & Plan Note (Signed)
She is encouraged to strive for BMI less than 30 to decrease cardiac risk. Advised to aim for at least 150 minutes of exercise per week.  

## 2022-11-26 DIAGNOSIS — Z79899 Other long term (current) drug therapy: Secondary | ICD-10-CM | POA: Diagnosis not present

## 2022-11-26 DIAGNOSIS — G894 Chronic pain syndrome: Secondary | ICD-10-CM | POA: Diagnosis not present

## 2022-11-26 DIAGNOSIS — Z79891 Long term (current) use of opiate analgesic: Secondary | ICD-10-CM | POA: Diagnosis not present

## 2022-11-26 DIAGNOSIS — M542 Cervicalgia: Secondary | ICD-10-CM | POA: Diagnosis not present

## 2022-11-26 DIAGNOSIS — G8929 Other chronic pain: Secondary | ICD-10-CM | POA: Diagnosis not present

## 2022-11-26 DIAGNOSIS — M5412 Radiculopathy, cervical region: Secondary | ICD-10-CM | POA: Diagnosis not present

## 2022-11-26 DIAGNOSIS — R202 Paresthesia of skin: Secondary | ICD-10-CM | POA: Diagnosis not present

## 2022-11-26 DIAGNOSIS — M545 Low back pain, unspecified: Secondary | ICD-10-CM | POA: Diagnosis not present

## 2022-12-02 NOTE — Addendum Note (Signed)
Addended by: [name] F on: 12/02/2022 02:19 PM   Modules accepted: Level of Service

## 2022-12-17 DIAGNOSIS — F411 Generalized anxiety disorder: Secondary | ICD-10-CM | POA: Diagnosis not present

## 2022-12-17 DIAGNOSIS — F330 Major depressive disorder, recurrent, mild: Secondary | ICD-10-CM | POA: Diagnosis not present

## 2022-12-23 DIAGNOSIS — F411 Generalized anxiety disorder: Secondary | ICD-10-CM | POA: Diagnosis not present

## 2022-12-23 DIAGNOSIS — F330 Major depressive disorder, recurrent, mild: Secondary | ICD-10-CM | POA: Diagnosis not present

## 2022-12-30 DIAGNOSIS — F411 Generalized anxiety disorder: Secondary | ICD-10-CM | POA: Diagnosis not present

## 2022-12-30 DIAGNOSIS — F330 Major depressive disorder, recurrent, mild: Secondary | ICD-10-CM | POA: Diagnosis not present

## 2023-01-03 DIAGNOSIS — G8929 Other chronic pain: Secondary | ICD-10-CM | POA: Diagnosis not present

## 2023-01-03 DIAGNOSIS — Z79891 Long term (current) use of opiate analgesic: Secondary | ICD-10-CM | POA: Diagnosis not present

## 2023-01-03 DIAGNOSIS — M5412 Radiculopathy, cervical region: Secondary | ICD-10-CM | POA: Diagnosis not present

## 2023-01-03 DIAGNOSIS — M545 Low back pain, unspecified: Secondary | ICD-10-CM | POA: Diagnosis not present

## 2023-01-03 DIAGNOSIS — M542 Cervicalgia: Secondary | ICD-10-CM | POA: Diagnosis not present

## 2023-01-03 DIAGNOSIS — G894 Chronic pain syndrome: Secondary | ICD-10-CM | POA: Diagnosis not present

## 2023-01-03 DIAGNOSIS — R202 Paresthesia of skin: Secondary | ICD-10-CM | POA: Diagnosis not present

## 2023-01-07 DIAGNOSIS — Z1272 Encounter for screening for malignant neoplasm of vagina: Secondary | ICD-10-CM | POA: Diagnosis not present

## 2023-01-07 DIAGNOSIS — N390 Urinary tract infection, site not specified: Secondary | ICD-10-CM | POA: Diagnosis not present

## 2023-01-07 DIAGNOSIS — Z0000 Encounter for general adult medical examination without abnormal findings: Secondary | ICD-10-CM | POA: Diagnosis not present

## 2023-01-07 DIAGNOSIS — Z7689 Persons encountering health services in other specified circumstances: Secondary | ICD-10-CM | POA: Diagnosis not present

## 2023-01-07 DIAGNOSIS — Z6833 Body mass index (BMI) 33.0-33.9, adult: Secondary | ICD-10-CM | POA: Diagnosis not present

## 2023-01-07 DIAGNOSIS — Z13820 Encounter for screening for osteoporosis: Secondary | ICD-10-CM | POA: Diagnosis not present

## 2023-01-07 DIAGNOSIS — Z1231 Encounter for screening mammogram for malignant neoplasm of breast: Secondary | ICD-10-CM | POA: Diagnosis not present

## 2023-01-07 DIAGNOSIS — Z1151 Encounter for screening for human papillomavirus (HPV): Secondary | ICD-10-CM | POA: Diagnosis not present

## 2023-01-07 LAB — HM DEXA SCAN

## 2023-01-08 LAB — HM PAP SMEAR: HPV, high-risk: NEGATIVE

## 2023-01-10 DIAGNOSIS — M25511 Pain in right shoulder: Secondary | ICD-10-CM | POA: Diagnosis not present

## 2023-01-10 DIAGNOSIS — M542 Cervicalgia: Secondary | ICD-10-CM | POA: Diagnosis not present

## 2023-01-14 DIAGNOSIS — F411 Generalized anxiety disorder: Secondary | ICD-10-CM | POA: Diagnosis not present

## 2023-01-14 DIAGNOSIS — F330 Major depressive disorder, recurrent, mild: Secondary | ICD-10-CM | POA: Diagnosis not present

## 2023-01-20 ENCOUNTER — Ambulatory Visit: Payer: Medicaid Other | Admitting: Family Medicine

## 2023-01-20 NOTE — Progress Notes (Deleted)
   Rubin Payor, PhD, LAT, ATC acting as a scribe for Clementeen Graham, MD.  [name] is a 47 y.o. female who presents to [facility] today for osteoporosis management.  DEXA scan (date, T-score): 01/07/23: AP spine= -1.5, R-FN= -0.1, L-FN= -0.3 Prior treatment: *** History of Hip, Spine, or Wrist Fx: *** Heart disease or stroke: *** Cancer: *** Kidney Disease: *** Gastric/Peptic Ulcer: *** Gastric bypass surgery: *** Severe GERD: *** Hx of seizures: *** Age at Menopause: *** Calcium intake: *** Vitamin D intake: *** Hormone replacement therapy: *** Smoking history: *** Alcohol: *** Exercise: *** Major dental work in past year: *** Parents with hip/spine fracture: *** Height loss: ***    Pertinent review of systems: ***  Relevant historical information: ***   Exam:  LMP 12/27/2003  General: Well Developed, well nourished, and in no acute distress.   MSK: ***    Lab and Radiology Results No results found for this or any previous visit (from the past 72 hour(s)). No results found.     Assessment and Plan: 47 y.o. female with ***   PDMP not reviewed this encounter. No orders of the defined types were placed in this encounter.  No orders of the defined types were placed in this encounter.    Discussed warning signs or symptoms. Please see discharge instructions. Patient expresses understanding.   ***

## 2023-01-21 ENCOUNTER — Ambulatory Visit: Payer: Medicaid Other | Admitting: Family Medicine

## 2023-01-21 VITALS — BP 104/64 | HR 60 | Ht 64.0 in | Wt 196.0 lb

## 2023-01-21 DIAGNOSIS — M810 Age-related osteoporosis without current pathological fracture: Secondary | ICD-10-CM

## 2023-01-21 NOTE — Progress Notes (Signed)
   Rubin Payor, PhD, LAT, ATC acting as a scribe for Clementeen Graham, MD.  Jonni Sanger is a 47 y.o. female who presents to [facility] at [facility] today for osteoporosis management.  DEXA scan (date, T-score): 01/07/23: AP spine= -1.5, R-FN= -0.1, L-FN= -0.3 Prior treatment: Reclast- bad reaction History of Hip, Spine, or Wrist Fx: no Heart disease or stroke: no Cancer: no Kidney Disease: no Gastric/Peptic Ulcer: no Gastric bypass surgery: yes- 03/19/2022 Severe GERD: yes Hx of seizures: no Age at Menopause: partial hysterectomy 2005, remaining hyst 2011 Calcium intake: yes-1400IU Vitamin D intake: yes Hormone replacement therapy: yes- estradiol patch Smoking history: no Alcohol: no Exercise: no Major dental work in past year: no Parents with hip/spine fracture: no Height loss: no    Pertinent review of systems: No fevers or chills  Relevant historical information: Roux-en-Y gastric bypass.  History of Reclast infusion with significant ongoing bone pain for greater than several months   Exam:  BP 104/64   Pulse 60   Ht 5\' 4"  (1.626 m)   Wt 196 lb (88.9 kg)   LMP 12/27/2003   SpO2 97%   BMI 33.64 kg/m  General: Well Developed, well nourished, and in no acute distress.   MSK: Normal lumbar motion normal gait.      Assessment and Plan: 47 y.o. female with osteoporosis.  She reportedly had a T-score worse than -2.5 in the past and had dramatic improvement of her bone density with Reclast infusion and estrogen patches over the interim.  Her most recent DEXA score was -1.5.  It is hard to know exactly what to do here.  I do not think she is gena meet criteria for the newer osteoporosis medications like Prolia as her bone density test does not support osteoporosis at this time.  Reclast infusions are intolerable for her and I do not think she is a good candidate for oral bisphosphonates due to the risk of pill erosive esophagitis in the setting of a  Berchuck surgery with some complications.  She is receiving estrogen patches which are probably beneficial.  If the time comes in her life when it makes sense to stop estrogen patch as she may be a good candidate for Evista.  Recommend rechecking bone density in about 2 years.  Recommend maximizing weightbearing exercise.  Vitamin D was adequate and last checked.  Recommend getting enough calcium in her diet.   PDMP not reviewed this encounter. No orders of the defined types were placed in this encounter.  No orders of the defined types were placed in this encounter.    Discussed warning signs or symptoms. Please see discharge instructions. Patient expresses understanding.   The above documentation has been reviewed and is accurate and complete Clementeen Graham, M.D. Total encounter time 30 minutes including face-to-face time with the patient and, reviewing past medical record, and charting on the date of service.

## 2023-01-21 NOTE — Patient Instructions (Signed)
Thank you for coming in today.   I think you are doing ok with your bone density.   I would recommend adding weight bearing exercise like walking most of the days of the week.   Make sure you have enough Vit D and calcium.   Recheck in 2 years for bone density.   I think the estrogen patches are working as a bone density medicine right now.

## 2023-01-31 DIAGNOSIS — M542 Cervicalgia: Secondary | ICD-10-CM | POA: Diagnosis not present

## 2023-01-31 DIAGNOSIS — M545 Low back pain, unspecified: Secondary | ICD-10-CM | POA: Diagnosis not present

## 2023-01-31 DIAGNOSIS — G8929 Other chronic pain: Secondary | ICD-10-CM | POA: Diagnosis not present

## 2023-01-31 DIAGNOSIS — R202 Paresthesia of skin: Secondary | ICD-10-CM | POA: Diagnosis not present

## 2023-01-31 DIAGNOSIS — Z79891 Long term (current) use of opiate analgesic: Secondary | ICD-10-CM | POA: Diagnosis not present

## 2023-01-31 DIAGNOSIS — G894 Chronic pain syndrome: Secondary | ICD-10-CM | POA: Diagnosis not present

## 2023-01-31 DIAGNOSIS — M5412 Radiculopathy, cervical region: Secondary | ICD-10-CM | POA: Diagnosis not present

## 2023-02-03 NOTE — Therapy (Unsigned)
OUTPATIENT PHYSICAL THERAPY CERVICAL EVALUATION   Patient Name: [name] MRN: [id number] DOB:07-06-1975, 47 y.o., female Today's Date: 02/03/2023  END OF SESSION:   Past Medical History:  Diagnosis Date   Allergy    Anemia    Anxiety    Apnea 12/08/2019   Arthritis    Asthma    Back pain    Constipation    Exertional dyspnea 03/14/2020   Fatty liver    GERD (gastroesophageal reflux disease)    H/O: hysterectomy    Hx of endometriosis    Inappropriate sinus tachycardia 12/08/2019   Insomnia    Joint pain    Lactose intolerance    Migraine    Morbid obesity (HCC) 12/08/2019   Osteoporosis    Pneumonia    Pre-diabetes    Urticaria    Vitamin D deficiency    Past Surgical History:  Procedure Laterality Date   47 HOUR PH STUDY N/A 01/12/2018   Procedure: 24 HOUR PH STUDY;  Surgeon: Napoleon Form, MD;  Location: [facility];  Service: Endoscopy;  Laterality: N/A;   ABDOMINAL HYSTERECTOMY  12/12/2003   ADENOIDECTOMY  05/2011   ANKLE ARTHROSCOPY  12/29/2007   right; with extensive debridement   BLADDER NECK RECONSTRUCTION  01/14/2011   Procedure: BLADDER NECK REPAIR;  Surgeon: Reva Bores, MD;  Location: [facility];  Service: Gynecology;  Laterality: N/A;  Laparoscopic Repair of Incidental Cystotomy   BREAST REDUCTION SURGERY  08/05/2011   Procedure: MAMMARY REDUCTION  (BREAST);  Surgeon: Louisa Second, MD;  Location: [facility];  Service: Plastics;  Laterality: Bilateral;  bilateral   CESAREAN SECTION  12/29/2000; 1994   CHOLECYSTECTOMY     age 57   DILATION AND CURETTAGE OF UTERUS  07/08/2003   open laparoscopy   ESOPHAGEAL MANOMETRY N/A 01/12/2018   Procedure: ESOPHAGEAL MANOMETRY (EM);  Surgeon: Napoleon Form, MD;  Location: [facility];  Service: Endoscopy;  Laterality: N/A;   GASTRIC ROUX-EN-Y N/A 03/19/2022   Procedure: LAPAROSCOPIC ROUX-EN-Y GASTRIC BYPASS WITH UPPER ENDOSCOPY;  Surgeon: Gaynelle Adu, MD;  Location: [facility];   Service: General;  Laterality: N/A;   GIVENS CAPSULE STUDY N/A 06/23/2017   Procedure: GIVENS CAPSULE STUDY;  Surgeon: Benancio Deeds, MD;  Location: [facility];  Service: Gastroenterology;  Laterality: N/A;   HIATAL HERNIA REPAIR N/A 03/19/2022   Procedure: HERNIA REPAIR HIATAL;  Surgeon: Gaynelle Adu, MD;  Location: [facility];  Service: General;  Laterality: N/A;   LAPAROSCOPIC SALPINGOOPHERECTOMY  01/14/2011   left   LAPAROSCOPY N/A 11/01/2022   Procedure: LAPAROSCOPY DIAGNOSTIC; LAPARSCOPIC CLOSURE OF PETERSEN SPACE;  Surgeon: Gaynelle Adu, MD;  Location: [facility];  Service: General;  Laterality: N/A;  DR Gerrit Friends TO ASSIST   PH IMPEDANCE STUDY N/A 01/12/2018   Procedure: PH IMPEDANCE STUDY;  Surgeon: Napoleon Form, MD;  Location: [facility];  Service: Endoscopy;  Laterality: N/A;   RESECTION DISTAL CLAVICAL Right 09/25/2017   Procedure: RESECTION DISTAL CLAVICAL;  Surgeon: Bjorn Pippin, MD;  Location: [facility];  Service: Orthopedics;  Laterality: Right;   RIGHT OOPHORECTOMY  2011   with lysis of adhesions   SHOULDER ACROMIOPLASTY Right 09/25/2017   Procedure: SHOULDER ACROMIOPLASTY;  Surgeon: Bjorn Pippin, MD;  Location: [facility];  Service: Orthopedics;  Laterality: Right;   SHOULDER ARTHROSCOPY WITH DEBRIDEMENT AND BICEP TENDON REPAIR Right 09/25/2017   Procedure: SHOULDER ARTHROSCOPY WITH DEBRIDEMENT AND BICEP TENDON REPAIR;  Surgeon: Bjorn Pippin, MD;  Location: [facility];  Service: Orthopedics;  Laterality: Right;   SHOULDER ARTHROSCOPY WITH ROTATOR CUFF REPAIR Right 09/25/2017   Procedure: SHOULDER ARTHROSCOPY WITH ROTATOR CUFF REPAIR;  Surgeon: Bjorn Pippin, MD;  Location: [facility];  Service: Orthopedics;  Laterality: Right;   TONSILLECTOMY  as a child   TUBAL LIGATION  12/29/2000   UPPER GASTROINTESTINAL ENDOSCOPY     UPPER GI ENDOSCOPY N/A 11/01/2022   Procedure: UPPER GI ENDOSCOPY;  Surgeon: Gaynelle Adu, MD;  Location: [facility];  Service: General;  Laterality: N/A;   Patient Active Problem List   Diagnosis Date Noted   Class 1 obesity due to excess calories with serious comorbidity and body mass index (BMI) of 33.0 to 33.9 in adult 11/05/2022   COVID-19 vaccination declined 11/05/2022   Abdominal pain 08/21/2022   Anemia of pregnancy 08/21/2022   Chronic pelvic pain in female 08/21/2022   History of cesarean section 08/21/2022   Irregular intermenstrual bleeding 08/21/2022   S/P gastric bypass 03/19/2022   Microcytic anemia 12/14/2021   Iron deficiency anemia 12/14/2021   Adverse food reaction 03/21/2021   Depressive disorder 01/10/2021   Osteoporosis 01/10/2021   Thyroid nodule 01/10/2021   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   Ascites 10/05/2020   Muscle tension dysphonia 08/10/2020   Other insomnia 07/27/2020   At risk for side effect of medication 07/27/2020   Other hyperlipidemia 05/15/2020   At risk for osteoporosis 05/15/2020   At risk for impaired metabolic function 04/04/2020   Other fatigue 03/22/2020   SOBOE (shortness of breath on exertion) 03/22/2020   Exertional dyspnea 03/14/2020   Cyst of ovary 03/11/2020   Endometriosis 03/11/2020   Herpes simplex type 2 infection 03/11/2020   Neuropathy, right side of face 03/08/2020   At risk for depression 03/08/2020   Primary osteoarthritis of both feet 02/26/2020   Hyperlipidemia 01/26/2020   At risk for heart disease 01/26/2020   Daytime somnolence 01/26/2020   Abnormal glucose 01/26/2020   Vitamin D deficiency 01/04/2020   Fatty liver 01/04/2020   B12 deficiency 01/04/2020   Moderate persistent asthma without complication 12/31/2019   Inappropriate sinus tachycardia 12/08/2019   Apnea 12/08/2019   Anaphylactic reaction due to food, subsequent encounter 11/22/2019   Idiopathic urticaria 11/22/2019   Generalized anxiety disorder 04/08/2018   Atypical chest pain    Regurgitation of food    Laryngopharyngeal reflux  (LPR) 07/30/2017   ANA positive 12/30/2016   Gastroesophageal reflux disease 06/27/2016   Disturbance of skin sensation 04/27/2014   Dizziness and giddiness 03/14/2014   Headache, migraine 03/14/2014   Myalgia and myositis, unspecified 06/07/2013   Nausea and vomiting in adult 08/22/2012   Pelvic pain in female 12/18/2010   ANEMIA-IRON DEFICIENCY 10/15/2006   HERPES, GENITAL NOS 09/30/2006   Obesity (BMI 35.0-39.9 without comorbidity) 09/30/2006   DISORDER, NONORGANIC SLEEP NOS 09/30/2006   CONSTIPATION NOS 09/30/2006   NEURITIS, LUMBOSACRAL NOS 09/30/2006   HX, PERSONAL, MENTAL DISORDER NOS 09/30/2006    PCP: [name], FNP   REFERRING PROVIDER: Teryl Lucy, MD  REFERRING DIAG: CERVICALGIA WITH RADICULOPATHY  THERAPY DIAG:  No diagnosis found.  Rationale for Evaluation and Treatment: Rehabilitation  ONSET DATE: chronic  SUBJECTIVE:  SUBJECTIVE STATEMENT: *** Hand dominance: {MISC; OT HAND DOMINANCE:[id number]}  PERTINENT HISTORY:  ***  PAIN:  Are you having pain? {OPRCPAIN:27236}  PRECAUTIONS: None  RED FLAGS: None     WEIGHT BEARING RESTRICTIONS: No  FALLS:  Has patient fallen in last 6 months? No  OCCUPATION: ***  PLOF: {PLOF:24004}  PATIENT GOALS: ***  NEXT MD VISIT: ***  OBJECTIVE:  Note: Objective measures were completed at Evaluation unless otherwise noted.  DIAGNOSTIC FINDINGS:  None available  PATIENT SURVEYS:  FOTO ***  POSTURE: {posture:25561}  PALPATION: ***   CERVICAL ROM:   {AROM/PROM:27142} ROM A/PROM (deg) eval  Flexion   Extension   Right lateral flexion   Left lateral flexion   Right rotation   Left rotation    (Blank rows = not tested)  UPPER EXTREMITY ROM:  {AROM/PROM:27142} ROM Right eval Left eval   Shoulder flexion    Shoulder extension    Shoulder abduction    Shoulder adduction    Shoulder extension    Shoulder internal rotation    Shoulder external rotation    Elbow flexion    Elbow extension    Wrist flexion    Wrist extension    Wrist ulnar deviation    Wrist radial deviation    Wrist pronation    Wrist supination     (Blank rows = not tested)  UPPER EXTREMITY MMT:  MMT Right eval Left eval  Shoulder flexion    Shoulder extension    Shoulder abduction    Shoulder adduction    Shoulder extension    Shoulder internal rotation    Shoulder external rotation    Middle trapezius    Lower trapezius    Elbow flexion    Elbow extension    Wrist flexion    Wrist extension    Wrist ulnar deviation    Wrist radial deviation    Wrist pronation    Wrist supination    Grip strength     (Blank rows = not tested)  CERVICAL SPECIAL TESTS:  {Cervical special tests:25246}  FUNCTIONAL TESTS:  {Functional tests:24029}  TODAY'S TREATMENT:                                                                                                                              DATE: ***   PATIENT EDUCATION:  Education details: Discussed eval findings, rehab rationale and POC and patient is in agreement  Person educated: Patient Education method: Explanation Education comprehension: verbalized understanding and needs further education  HOME EXERCISE PROGRAM: ***  ASSESSMENT:  CLINICAL IMPRESSION: Patient is a *** y.o. *** who was seen today for physical therapy evaluation and treatment for ***.   OBJECTIVE IMPAIRMENTS: {opptimpairments:25111}.   ACTIVITY LIMITATIONS: {activitylimitations:27494}  PARTICIPATION LIMITATIONS: {participationrestrictions:25113}  PERSONAL FACTORS: {Personal factors:25162} are also affecting patient's functional outcome.   REHAB POTENTIAL: Good  CLINICAL DECISION MAKING: Stable/uncomplicated  EVALUATION COMPLEXITY: Low   GOALS: Goals  reviewed with patient? {yes/no:20286}  SHORT TERM GOALS: Target date: ***  *** Baseline:  Goal status: INITIAL  2.  *** Baseline:  Goal status: INITIAL  3.  *** Baseline:  Goal status: INITIAL  4.  *** Baseline:  Goal status: INITIAL  5.  *** Baseline:  Goal status: INITIAL  6.  *** Baseline:  Goal status: INITIAL  LONG TERM GOALS: Target date: ***  *** Baseline:  Goal status: INITIAL  2.  *** Baseline:  Goal status: INITIAL  3.  *** Baseline:  Goal status: INITIAL  4.  *** Baseline:  Goal status: INITIAL  5.  *** Baseline:  Goal status: INITIAL  6.  *** Baseline:  Goal status: INITIAL   PLAN:  PT FREQUENCY: {rehab frequency:25116}  PT DURATION: {rehab duration:25117}  PLANNED INTERVENTIONS: Therapeutic exercises, Therapeutic activity, Neuromuscular re-education, Balance training, Gait training, Patient/Family education, Self Care, Joint mobilization, Dry Needling, Electrical stimulation, Spinal mobilization, Cryotherapy, Moist heat, Manual therapy, and Re-evaluation  PLAN FOR NEXT SESSION: HEP review and update, manual techniques as appropriate, aerobic tasks, ROM and flexibility activities, strengthening and PREs, TPDN, gait and balance training as needed     Hildred Laser, PT 02/03/2023, 1:42 PM

## 2023-02-04 ENCOUNTER — Other Ambulatory Visit: Payer: Self-pay

## 2023-02-04 ENCOUNTER — Ambulatory Visit: Payer: Medicaid Other | Attending: Orthopedic Surgery

## 2023-02-04 DIAGNOSIS — R293 Abnormal posture: Secondary | ICD-10-CM | POA: Insufficient documentation

## 2023-02-04 DIAGNOSIS — F411 Generalized anxiety disorder: Secondary | ICD-10-CM | POA: Diagnosis not present

## 2023-02-04 DIAGNOSIS — M6281 Muscle weakness (generalized): Secondary | ICD-10-CM | POA: Insufficient documentation

## 2023-02-04 DIAGNOSIS — M542 Cervicalgia: Secondary | ICD-10-CM | POA: Insufficient documentation

## 2023-02-04 DIAGNOSIS — F330 Major depressive disorder, recurrent, mild: Secondary | ICD-10-CM | POA: Diagnosis not present

## 2023-02-18 DIAGNOSIS — F330 Major depressive disorder, recurrent, mild: Secondary | ICD-10-CM | POA: Diagnosis not present

## 2023-02-18 DIAGNOSIS — F411 Generalized anxiety disorder: Secondary | ICD-10-CM | POA: Diagnosis not present

## 2023-02-19 ENCOUNTER — Ambulatory Visit: Payer: Medicaid Other

## 2023-02-19 DIAGNOSIS — R293 Abnormal posture: Secondary | ICD-10-CM | POA: Diagnosis not present

## 2023-02-19 DIAGNOSIS — M6281 Muscle weakness (generalized): Secondary | ICD-10-CM | POA: Diagnosis not present

## 2023-02-19 DIAGNOSIS — M542 Cervicalgia: Secondary | ICD-10-CM

## 2023-02-19 NOTE — Therapy (Signed)
OUTPATIENT PHYSICAL THERAPY TREATMENT NOTE   Patient Name: [name] MRN: [id number] DOB:Jul 10, 1975, 47 y.o., female Today's Date: 02/19/2023  END OF SESSION:  PT End of Session - 02/19/23 1615     Visit Number 2    Number of Visits 12    Date for PT Re-Evaluation 04/06/23    Authorization Type MCD    Authorization Time Period 7 visits approved 02/17/23-04/17/23    Authorization - Visit Number 1    Authorization - Number of Visits 7    PT Start Time 1615    PT Stop Time 1655    PT Time Calculation (min) 40 min    Activity Tolerance Patient tolerated treatment well    Behavior During Therapy [facility] for tasks assessed/performed              Past Medical History:  Diagnosis Date   Allergy    Anemia    Anxiety    Apnea 12/08/2019   Arthritis    Asthma    Back pain    Constipation    Exertional dyspnea 03/14/2020   Fatty liver    GERD (gastroesophageal reflux disease)    H/O: hysterectomy    Hx of endometriosis    Inappropriate sinus tachycardia (HCC) 12/08/2019   Insomnia    Joint pain    Lactose intolerance    Migraine    Morbid obesity (HCC) 12/08/2019   Osteoporosis    Pneumonia    Pre-diabetes    Urticaria    Vitamin D deficiency    Past Surgical History:  Procedure Laterality Date   4 HOUR PH STUDY N/A 01/12/2018   Procedure: 24 HOUR PH STUDY;  Surgeon: Napoleon Form, MD;  Location: [facility];  Service: Endoscopy;  Laterality: N/A;   ABDOMINAL HYSTERECTOMY  12/12/2003   ADENOIDECTOMY  05/2011   ANKLE ARTHROSCOPY  12/29/2007   right; with extensive debridement   BLADDER NECK RECONSTRUCTION  01/14/2011   Procedure: BLADDER NECK REPAIR;  Surgeon: Reva Bores, MD;  Location: [facility];  Service: Gynecology;  Laterality: N/A;  Laparoscopic Repair of Incidental Cystotomy   BREAST REDUCTION SURGERY  08/05/2011   Procedure: MAMMARY REDUCTION  (BREAST);  Surgeon: Louisa Second, MD;  Location: [name];  Service: Plastics;   Laterality: Bilateral;  bilateral   CESAREAN SECTION  12/29/2000; 1994   CHOLECYSTECTOMY     age 74   DILATION AND CURETTAGE OF UTERUS  07/08/2003   open laparoscopy   ESOPHAGEAL MANOMETRY N/A 01/12/2018   Procedure: ESOPHAGEAL MANOMETRY (EM);  Surgeon: Napoleon Form, MD;  Location: [facility];  Service: Endoscopy;  Laterality: N/A;   GASTRIC ROUX-EN-Y N/A 03/19/2022   Procedure: LAPAROSCOPIC ROUX-EN-Y GASTRIC BYPASS WITH UPPER ENDOSCOPY;  Surgeon: Gaynelle Adu, MD;  Location: [facility];  Service: General;  Laterality: N/A;   GIVENS CAPSULE STUDY N/A 06/23/2017   Procedure: GIVENS CAPSULE STUDY;  Surgeon: Benancio Deeds, MD;  Location: [facility];  Service: Gastroenterology;  Laterality: N/A;   HIATAL HERNIA REPAIR N/A 03/19/2022   Procedure: HERNIA REPAIR HIATAL;  Surgeon: Gaynelle Adu, MD;  Location: [facility];  Service: General;  Laterality: N/A;   LAPAROSCOPIC SALPINGOOPHERECTOMY  01/14/2011   left   LAPAROSCOPY N/A 11/01/2022   Procedure: LAPAROSCOPY DIAGNOSTIC; LAPARSCOPIC CLOSURE OF PETERSEN SPACE;  Surgeon: Gaynelle Adu, MD;  Location: [facility];  Service: General;  Laterality: N/A;  DR Gerrit Friends TO ASSIST   PH IMPEDANCE STUDY N/A 01/12/2018   Procedure: PH IMPEDANCE STUDY;  Surgeon: Lavon Paganini,  Eleonore Chiquito, MD;  Location: [facility];  Service: Endoscopy;  Laterality: N/A;   RESECTION DISTAL CLAVICAL Right 09/25/2017   Procedure: RESECTION DISTAL CLAVICAL;  Surgeon: Bjorn Pippin, MD;  Location: [facility];  Service: Orthopedics;  Laterality: Right;   RIGHT OOPHORECTOMY  2011   with lysis of adhesions   SHOULDER ACROMIOPLASTY Right 09/25/2017   Procedure: SHOULDER ACROMIOPLASTY;  Surgeon: Bjorn Pippin, MD;  Location: [facility];  Service: Orthopedics;  Laterality: Right;   SHOULDER ARTHROSCOPY WITH DEBRIDEMENT AND BICEP TENDON REPAIR Right 09/25/2017   Procedure: SHOULDER ARTHROSCOPY WITH DEBRIDEMENT AND BICEP TENDON REPAIR;  Surgeon: Bjorn Pippin,  MD;  Location: [facility];  Service: Orthopedics;  Laterality: Right;   SHOULDER ARTHROSCOPY WITH ROTATOR CUFF REPAIR Right 09/25/2017   Procedure: SHOULDER ARTHROSCOPY WITH ROTATOR CUFF REPAIR;  Surgeon: Bjorn Pippin, MD;  Location: [facility];  Service: Orthopedics;  Laterality: Right;   TONSILLECTOMY  as a child   TUBAL LIGATION  12/29/2000   UPPER GASTROINTESTINAL ENDOSCOPY     UPPER GI ENDOSCOPY N/A 11/01/2022   Procedure: UPPER GI ENDOSCOPY;  Surgeon: Gaynelle Adu, MD;  Location: [facility];  Service: General;  Laterality: N/A;   Patient Active Problem List   Diagnosis Date Noted   Class 1 obesity due to excess calories with serious comorbidity and body mass index (BMI) of 33.0 to 33.9 in adult 11/05/2022   COVID-19 vaccination declined 11/05/2022   Abdominal pain 08/21/2022   Anemia of pregnancy 08/21/2022   Chronic pelvic pain in female 08/21/2022   History of cesarean section 08/21/2022   Irregular intermenstrual bleeding 08/21/2022   S/P gastric bypass 03/19/2022   Microcytic anemia 12/14/2021   Iron deficiency anemia 12/14/2021   Adverse food reaction 03/21/2021   Depressive disorder 01/10/2021   Osteoporosis 01/10/2021   Thyroid nodule 01/10/2021   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   Ascites 10/05/2020   Muscle tension dysphonia 08/10/2020   Other insomnia 07/27/2020   At risk for side effect of medication 07/27/2020   Other hyperlipidemia 05/15/2020   At risk for osteoporosis 05/15/2020   At risk for impaired metabolic function 04/04/2020   Other fatigue 03/22/2020   SOBOE (shortness of breath on exertion) 03/22/2020   Exertional dyspnea 03/14/2020   Cyst of ovary 03/11/2020   Endometriosis 03/11/2020   Herpes simplex type 2 infection 03/11/2020   Neuropathy, right side of face 03/08/2020   At risk for depression 03/08/2020   Primary osteoarthritis of both feet 02/26/2020   Hyperlipidemia 01/26/2020   At risk for heart disease 01/26/2020    Daytime somnolence 01/26/2020   Abnormal glucose 01/26/2020   Vitamin D deficiency 01/04/2020   Fatty liver 01/04/2020   B12 deficiency 01/04/2020   Moderate persistent asthma without complication 12/31/2019   Inappropriate sinus tachycardia (HCC) 12/08/2019   Apnea 12/08/2019   Anaphylactic reaction due to food, subsequent encounter 11/22/2019   Idiopathic urticaria 11/22/2019   Generalized anxiety disorder 04/08/2018   Atypical chest pain    Regurgitation of food    Laryngopharyngeal reflux (LPR) 07/30/2017   ANA positive 12/30/2016   Gastroesophageal reflux disease 06/27/2016   Disturbance of skin sensation 04/27/2014   Dizziness and giddiness 03/14/2014   Headache, migraine 03/14/2014   Myalgia and myositis, unspecified 06/07/2013   Nausea and vomiting in adult 08/22/2012   Pelvic pain in female 12/18/2010   ANEMIA-IRON DEFICIENCY 10/15/2006   Genital herpes 09/30/2006   Obesity (BMI 35.0-39.9 without comorbidity) 09/30/2006  Nonorganic sleep disorder 09/30/2006   Constipation 09/30/2006   NEURITIS, LUMBOSACRAL NOS 09/30/2006   Personal history of mental disorder 09/30/2006    PCP: [name], FNP   REFERRING PROVIDER: Teryl Lucy, MD  REFERRING DIAG: CERVICALGIA WITH RADICULOPATHY  THERAPY DIAG:  Cervicalgia  Muscle weakness (generalized)  Abnormal posture  Rationale for Evaluation and Treatment: Rehabilitation  ONSET DATE: chronic  SUBJECTIVE:                                                                                                                                                                                                         SUBJECTIVE STATEMENT: Patient reports continued pain, though lessened today, has been compliant with HEP. She reports tingling in her right hand, especially with OH motions.  PERTINENT HISTORY:  HPI patient is a pleasant 47 year old female who comes in today with continued right shoulder and neck pain.  We have  seen her several times for this.  She has had 2 previous right shoulder surgeries without significant relief of symptoms.  She has had subsequent subacromial and glenohumeral cortisone injections without much relief.  MRI of the cervical spine and shoulder were obtained back in August of this year.  Neck MRI showed minimal cervical spine changes without any disc bulge or spinal canal or neural foraminal narrowing.  Right shoulder MRI showed mild supraspinatus tendinopathy.  No other findings.  She has been seeing Dr. Titus Dubin as she is previously had a positive ANA with sicca symptoms.  She is currently being referred to [facility] rheumatology for further work-up.   Review of Systems as detailed in HPI.  All others reviewed and are negative.  PAIN:  Are you having pain? Yes: NPRS scale: 4/10 Pain location: R shoulder/neck Pain description: ache Aggravating factors: activity, OH reaching Relieving factors: rest  PRECAUTIONS: None  RED FLAGS: None     WEIGHT BEARING RESTRICTIONS: No  FALLS:  Has patient fallen in last 6 months? No  OCCUPATION: bus driver  PLOF: Independent  PATIENT GOALS: To manage my neck pain  NEXT MD VISIT: 11/24  OBJECTIVE:  Note: Objective measures were completed at Evaluation unless otherwise noted.  DIAGNOSTIC FINDINGS:  None available  PATIENT SURVEYS:  FOTO 43(53 predicted)  POSTURE:  elevated R shoulder  PALPATION: TTP R SCM, scalenes, UT and infraspinatus    CERVICAL ROM:   Active ROM A/PROM (deg) eval  Flexion 50%  Extension 50%  Right lateral flexion 90%  Left lateral flexion 25%  Right rotation 75%  Left rotation 50%   (Blank rows = not tested)  UPPER EXTREMITY ROM:  Active ROM Right eval Left eval  Shoulder flexion 160 170  Shoulder extension    Shoulder abduction 150 170  Shoulder adduction    Shoulder extension    Shoulder internal rotation [facility] WFL  Shoulder external rotation [facility]  Elbow flexion    Elbow extension     Wrist flexion    Wrist extension    Wrist ulnar deviation    Wrist radial deviation    Wrist pronation    Wrist supination     (Blank rows = not tested)  UPPER EXTREMITY MMT: WFL throughout  MMT Right eval Left eval  Shoulder flexion    Shoulder extension    Shoulder abduction    Shoulder adduction    Shoulder extension    Shoulder internal rotation    Shoulder external rotation    Middle trapezius    Lower trapezius    Elbow flexion    Elbow extension    Wrist flexion    Wrist extension    Wrist ulnar deviation    Wrist radial deviation    Wrist pronation    Wrist supination    Grip strength     (Blank rows = not tested)  CERVICAL SPECIAL TESTS:  Neck flexor muscle endurance test: Positive 15s hold due to weakness  FUNCTIONAL TESTS:  5 times sit to stand: 12s  TODAY'S TREATMENT:         [facility] Adult PT Treatment:                                                DATE: 02/19/23 Therapeutic Exercise: UBE level 1 3'/3' fwd/bwd while gathering subjective High/low rows 20# x10 Standing horizontal abduction back against wall RTB x10 Standing diagonals back against wall RTB x10 BIL Seated BIL ER with scap retraction RTB x10 Seated upper trap stretch 2x30" BIL Seated levator scap stretch 2x30" BIL Manual Therapy: STM/MPTR BIL upper traps, cervical paraspinals Positional release BIL upper traps SO release Self Care: Theracane self use instruction                                                                                                                        DATE: 02/04/23 Eval   PATIENT EDUCATION:  Education details: Discussed eval findings, rehab rationale and POC and patient is in agreement  Person educated: Patient Education method: Explanation Education comprehension: verbalized understanding and needs further education  HOME EXERCISE PROGRAM: Access Code: [email or web address] URL: [email or web address] Date: 02/04/2023 Prepared by: Gustavus Bryant  Exercises - Shoulder External Rotation and Scapular Retraction with Resistance  - 2 x daily - 5 x weekly - 2 sets - 15 reps - Standing Shoulder Horizontal Abduction with Resistance  - 2 x daily - 5 x weekly - 2 sets - 15 reps  ASSESSMENT:  CLINICAL IMPRESSION: Patient presents to first  follow up PT session reporting continued pain, though lessened today, and that she has been compliant with her HEP. Session today focused on RTC and periscapular strengthening as well as stretching and manual techniques to decrease tension. Patient was able to tolerate all prescribed exercises with no adverse effects. Patient continues to benefit from skilled PT services and should be progressed as able to improve functional independence.    OBJECTIVE IMPAIRMENTS: decreased activity tolerance, decreased knowledge of condition, decreased mobility, decreased ROM, decreased strength, impaired UE functional use, postural dysfunction, and pain.   ACTIVITY LIMITATIONS: carrying, lifting, and reach over head  PERSONAL FACTORS: Fitness, Time since onset of injury/illness/exacerbation, and 1 comorbidity: R RC pathology  are also affecting patient's functional outcome.   REHAB POTENTIAL: Good  CLINICAL DECISION MAKING: Stable/uncomplicated  EVALUATION COMPLEXITY: Low   GOALS: Goals reviewed with patient? No  SHORT TERM GOALS: Target date: 03/18/2023  Patient to demonstrate independence in HEP  Baseline:  [id number] Goal status: INITIAL  2.  Increase L lateral flexion to 50% Baseline:  Active ROM A/PROM (deg) eval  Flexion 50%  Extension 50%  Right lateral flexion 90%  Left lateral flexion 25%  Right rotation 75%  Left rotation 50%   Goal status: INITIAL   LONG TERM GOALS: Target date: 03/18/2023  Patient will score at least 53% on FOTO to signify clinically meaningful improvement in functional abilities.   Baseline: 43% Goal status: INITIAL  2.  Increase cervical ROM to 75%  throughout Baseline:  Active ROM A/PROM (deg) eval  Flexion 50%  Extension 50%  Right lateral flexion 90%  Left lateral flexion 25%  Right rotation 75%  Left rotation 50%   Goal status: INITIAL  3.  Decrease worst pain to 6/10 Baseline: 8/10 Goal status: INITIAL  4.  Decrease tenderness in R UT, SCM, infraspinatus and scalene groups to minimal Baseline: moderate Goal status: INITIAL   PLAN:  PT FREQUENCY: 1-2x/week  PT DURATION: 6 weeks  PLANNED INTERVENTIONS: Therapeutic exercises, Therapeutic activity, Neuromuscular re-education, Balance training, Gait training, Patient/Family education, Self Care, Joint mobilization, Dry Needling, Electrical stimulation, Spinal mobilization, Cryotherapy, Moist heat, Manual therapy, and Re-evaluation  PLAN FOR NEXT SESSION: HEP review and update, manual techniques as appropriate, aerobic tasks, ROM and flexibility activities, strengthening and PREs, TPDN, gait and balance training as needed     Berta Minor, PTA 02/19/2023, 4:58 PM

## 2023-02-21 DIAGNOSIS — R350 Frequency of micturition: Secondary | ICD-10-CM | POA: Diagnosis not present

## 2023-02-21 DIAGNOSIS — N3000 Acute cystitis without hematuria: Secondary | ICD-10-CM | POA: Diagnosis not present

## 2023-02-23 ENCOUNTER — Encounter (HOSPITAL_COMMUNITY): Payer: Self-pay | Admitting: Emergency Medicine

## 2023-02-23 ENCOUNTER — Ambulatory Visit (INDEPENDENT_AMBULATORY_CARE_PROVIDER_SITE_OTHER): Payer: Medicaid Other

## 2023-02-23 ENCOUNTER — Ambulatory Visit (HOSPITAL_COMMUNITY)
Admission: EM | Admit: 2023-02-23 | Discharge: 2023-02-23 | Disposition: A | Discharge status: Home / Self Care | Payer: Medicaid Other | Attending: Internal Medicine | Admitting: Internal Medicine

## 2023-02-23 DIAGNOSIS — S93401A Sprain of unspecified ligament of right ankle, initial encounter: Secondary | ICD-10-CM

## 2023-02-23 DIAGNOSIS — M7661 Achilles tendinitis, right leg: Secondary | ICD-10-CM | POA: Diagnosis not present

## 2023-02-23 DIAGNOSIS — M25571 Pain in right ankle and joints of right foot: Secondary | ICD-10-CM | POA: Diagnosis not present
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
## 2023-02-23 NOTE — Discharge Instructions (Signed)
The x-ray reading we discussed is preliminary. Your x-ray will be read by a radiologist in next few hours. If there is a discrepancy, you will be contacted, and instructed on a new plan for you care.   Over-the-counter ibuprofen and/or acetaminophen as directed on the package for pain. Over-the-counter ankle brace for support

## 2023-02-25 ENCOUNTER — Ambulatory Visit: Payer: Medicaid Other

## 2023-02-25 DIAGNOSIS — R293 Abnormal posture: Secondary | ICD-10-CM | POA: Diagnosis not present

## 2023-02-25 DIAGNOSIS — M542 Cervicalgia: Secondary | ICD-10-CM | POA: Diagnosis not present

## 2023-02-25 DIAGNOSIS — M6281 Muscle weakness (generalized): Secondary | ICD-10-CM | POA: Diagnosis not present

## 2023-02-25 NOTE — Therapy (Signed)
OUTPATIENT PHYSICAL THERAPY TREATMENT NOTE   Patient Name: [name] MRN: [id number] DOB:[birth year], 47 y.o., female Today's Date: 02/25/2023  END OF SESSION:  PT End of Session - 02/25/23 1617     Visit Number 3    Number of Visits 12    Date for PT Re-Evaluation 04/06/23    Authorization Type MCD    Authorization Time Period 7 visits approved 02/17/23-04/17/23    Authorization - Visit Number 2    Authorization - Number of Visits 7    PT Start Time 1617    PT Stop Time 1657    PT Time Calculation (min) 40 min    Activity Tolerance Patient tolerated treatment well;Patient limited by pain    Behavior During Therapy [facility] for tasks assessed/performed               Past Medical History:  Diagnosis Date   Allergy    Anemia    Anxiety    Apnea 12/08/2019   Arthritis    Asthma    Back pain    Constipation    Exertional dyspnea 03/14/2020   Fatty liver    GERD (gastroesophageal reflux disease)    H/O: hysterectomy    Hx of endometriosis    Inappropriate sinus tachycardia (HCC) 12/08/2019   Insomnia    Joint pain    Lactose intolerance    Migraine    Morbid obesity (HCC) 12/08/2019   Osteoporosis    Pneumonia    Pre-diabetes    Urticaria    Vitamin D deficiency    Past Surgical History:  Procedure Laterality Date   28 HOUR PH STUDY N/A 01/12/2018   Procedure: 24 HOUR PH STUDY;  Surgeon: Napoleon Form, MD;  Location: [facility];  Service: Endoscopy;  Laterality: N/A;   ABDOMINAL HYSTERECTOMY  12/12/2003   ADENOIDECTOMY  05/2011   ANKLE ARTHROSCOPY  12/29/2007   right; with extensive debridement   BLADDER NECK RECONSTRUCTION  01/14/2011   Procedure: BLADDER NECK REPAIR;  Surgeon: Reva Bores, MD;  Location: [facility];  Service: Gynecology;  Laterality: N/A;  Laparoscopic Repair of Incidental Cystotomy   BREAST REDUCTION SURGERY  08/05/2011   Procedure: MAMMARY REDUCTION  (BREAST);  Surgeon: Louisa Second, MD;  Location: [facility];  Service: Plastics;  Laterality: Bilateral;  bilateral   CESAREAN SECTION  12/29/2000; 1994   CHOLECYSTECTOMY     age 33   DILATION AND CURETTAGE OF UTERUS  07/08/2003   open laparoscopy   ESOPHAGEAL MANOMETRY N/A 01/12/2018   Procedure: ESOPHAGEAL MANOMETRY (EM);  Surgeon: Napoleon Form, MD;  Location: [facility];  Service: Endoscopy;  Laterality: N/A;   GASTRIC ROUX-EN-Y N/A 03/19/2022   Procedure: LAPAROSCOPIC ROUX-EN-Y GASTRIC BYPASS WITH UPPER ENDOSCOPY;  Surgeon: Gaynelle Adu, MD;  Location: [facility];  Service: General;  Laterality: N/A;   GIVENS CAPSULE STUDY N/A 06/23/2017   Procedure: GIVENS CAPSULE STUDY;  Surgeon: Benancio Deeds, MD;  Location: [facility];  Service: Gastroenterology;  Laterality: N/A;   HIATAL HERNIA REPAIR N/A 03/19/2022   Procedure: HERNIA REPAIR HIATAL;  Surgeon: Gaynelle Adu, MD;  Location: [facility];  Service: General;  Laterality: N/A;   LAPAROSCOPIC SALPINGOOPHERECTOMY  01/14/2011   left   LAPAROSCOPY N/A 11/01/2022   Procedure: LAPAROSCOPY DIAGNOSTIC; LAPARSCOPIC CLOSURE OF PETERSEN SPACE;  Surgeon: Gaynelle Adu, MD;  Location: [facility];  Service: General;  Laterality: N/A;  DR Gerrit Friends TO ASSIST   PH IMPEDANCE STUDY N/A 01/12/2018   Procedure: PH IMPEDANCE  STUDY;  Surgeon: Napoleon Form, MD;  Location: [facility];  Service: Endoscopy;  Laterality: N/A;   RESECTION DISTAL CLAVICAL Right 09/25/2017   Procedure: RESECTION DISTAL CLAVICAL;  Surgeon: Bjorn Pippin, MD;  Location: [facility];  Service: Orthopedics;  Laterality: Right;   RIGHT OOPHORECTOMY  2011   with lysis of adhesions   SHOULDER ACROMIOPLASTY Right 09/25/2017   Procedure: SHOULDER ACROMIOPLASTY;  Surgeon: Bjorn Pippin, MD;  Location: [facility];  Service: Orthopedics;  Laterality: Right;   SHOULDER ARTHROSCOPY WITH DEBRIDEMENT AND BICEP TENDON REPAIR Right 09/25/2017   Procedure: SHOULDER ARTHROSCOPY WITH DEBRIDEMENT AND BICEP TENDON  REPAIR;  Surgeon: Bjorn Pippin, MD;  Location: [facility];  Service: Orthopedics;  Laterality: Right;   SHOULDER ARTHROSCOPY WITH ROTATOR CUFF REPAIR Right 09/25/2017   Procedure: SHOULDER ARTHROSCOPY WITH ROTATOR CUFF REPAIR;  Surgeon: Bjorn Pippin, MD;  Location:  [facility]ERY [facility];  Service: Orthopedics;  Laterality: Right;   TONSILLECTOMY  as a child   TUBAL LIGATION  12/29/2000   UPPER GASTROINTESTINAL ENDOSCOPY     UPPER GI ENDOSCOPY N/A 11/01/2022   Procedure: UPPER GI ENDOSCOPY;  Surgeon: Gaynelle Adu, MD;  Location: [facility];  Service: General;  Laterality: N/A;   Patient Active Problem List   Diagnosis Date Noted   Class 1 obesity due to excess calories with serious comorbidity and body mass index (BMI) of 33.0 to 33.9 in adult 11/05/2022   COVID-19 vaccination declined 11/05/2022   Abdominal pain 08/21/2022   Anemia of pregnancy 08/21/2022   Chronic pelvic pain in female 08/21/2022   History of cesarean section 08/21/2022   Irregular intermenstrual bleeding 08/21/2022   S/P gastric bypass 03/19/2022   Microcytic anemia 12/14/2021   Iron deficiency anemia 12/14/2021   Adverse food reaction 03/21/2021   Depressive disorder 01/10/2021   Osteoporosis 01/10/2021   Thyroid nodule 01/10/2021   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   Ascites 10/05/2020   Muscle tension dysphonia 08/10/2020   Other insomnia 07/27/2020   At risk for side effect of medication 07/27/2020   Other hyperlipidemia 05/15/2020   At risk for osteoporosis 05/15/2020   At risk for impaired metabolic function 04/04/2020   Other fatigue 03/22/2020   SOBOE (shortness of breath on exertion) 03/22/2020   Exertional dyspnea 03/14/2020   Cyst of ovary 03/11/2020   Endometriosis 03/11/2020   Herpes simplex type 2 infection 03/11/2020   Neuropathy, right side of face 03/08/2020   At risk for depression 03/08/2020   Primary osteoarthritis of both feet 02/26/2020   Hyperlipidemia 01/26/2020   At risk  for heart disease 01/26/2020   Daytime somnolence 01/26/2020   Abnormal glucose 01/26/2020   Vitamin D deficiency 01/04/2020   Fatty liver 01/04/2020   B12 deficiency 01/04/2020   Moderate persistent asthma without complication 12/31/2019   Inappropriate sinus tachycardia (HCC) 12/08/2019   Apnea 12/08/2019   Anaphylactic reaction due to food, subsequent encounter 11/22/2019   Idiopathic urticaria 11/22/2019   Generalized anxiety disorder 04/08/2018   Atypical chest pain    Regurgitation of food    Laryngopharyngeal reflux (LPR) 07/30/2017   ANA positive 12/30/2016   Gastroesophageal reflux disease 06/27/2016   Disturbance of skin sensation 04/27/2014   Dizziness and giddiness 03/14/2014   Headache, migraine 03/14/2014   Myalgia and myositis, unspecified 06/07/2013   Nausea and vomiting in adult 08/22/2012   Pelvic pain in female 12/18/2010   ANEMIA-IRON DEFICIENCY 10/15/2006   Genital herpes 09/30/2006   Obesity (BMI 35.0-39.9  without comorbidity) 09/30/2006   Nonorganic sleep disorder 09/30/2006   Constipation 09/30/2006   NEURITIS, LUMBOSACRAL NOS 09/30/2006   Personal history of mental disorder 09/30/2006    PCP: [name], FNP   REFERRING PROVIDER: Teryl Lucy, MD  REFERRING DIAG: CERVICALGIA WITH RADICULOPATHY  THERAPY DIAG:  Cervicalgia  Muscle weakness (generalized)  Abnormal posture  Rationale for Evaluation and Treatment: Rehabilitation  ONSET DATE: chronic  SUBJECTIVE:                                                                                                                                                                                                         SUBJECTIVE STATEMENT: Patient reports that she sprained her ankle on Sunday. She states that her Rt shoulder/neck have been hurting more since the session last week.   PERTINENT HISTORY:  HPI patient is a pleasant 47 year old female who comes in today with continued right shoulder  and neck pain.  We have seen her several times for this.  She has had 2 previous right shoulder surgeries without significant relief of symptoms.  She has had subsequent subacromial and glenohumeral cortisone injections without much relief.  MRI of the cervical spine and shoulder were obtained back in August of this year.  Neck MRI showed minimal cervical spine changes without any disc bulge or spinal canal or neural foraminal narrowing.  Right shoulder MRI showed mild supraspinatus tendinopathy.  No other findings.  She has been seeing Dr. Titus Dubin as she is previously had a positive ANA with sicca symptoms.  She is currently being referred to [facility] rheumatology for further work-up.   Review of Systems as detailed in HPI.  All others reviewed and are negative.  PAIN:  Are you having pain? Yes: NPRS scale: 5/10 Pain location: R shoulder/neck Pain description: ache Aggravating factors: activity, OH reaching Relieving factors: rest  PRECAUTIONS: None  RED FLAGS: None     WEIGHT BEARING RESTRICTIONS: No  FALLS:  Has patient fallen in last 6 months? No  OCCUPATION: bus driver  PLOF: Independent  PATIENT GOALS: To manage my neck pain  NEXT MD VISIT: 11/24  OBJECTIVE:  Note: Objective measures were completed at Evaluation unless otherwise noted.  DIAGNOSTIC FINDINGS:  None available  PATIENT SURVEYS:  FOTO 43(53 predicted)  POSTURE:  elevated R shoulder  PALPATION: TTP R SCM, scalenes, UT and infraspinatus    CERVICAL ROM:   Active ROM A/PROM (deg) eval  Flexion 50%  Extension 50%  Right lateral flexion 90%  Left lateral flexion 25%  Right rotation 75%  Left rotation 50%   (  Blank rows = not tested)  UPPER EXTREMITY ROM:  Active ROM Right eval Left eval  Shoulder flexion 160 170  Shoulder extension    Shoulder abduction 150 170  Shoulder adduction    Shoulder extension    Shoulder internal rotation [facility] WFL  Shoulder external rotation [facility]  Elbow flexion     Elbow extension    Wrist flexion    Wrist extension    Wrist ulnar deviation    Wrist radial deviation    Wrist pronation    Wrist supination     (Blank rows = not tested)  UPPER EXTREMITY MMT: WFL throughout  MMT Right eval Left eval  Shoulder flexion    Shoulder extension    Shoulder abduction    Shoulder adduction    Shoulder extension    Shoulder internal rotation    Shoulder external rotation    Middle trapezius    Lower trapezius    Elbow flexion    Elbow extension    Wrist flexion    Wrist extension    Wrist ulnar deviation    Wrist radial deviation    Wrist pronation    Wrist supination    Grip strength     (Blank rows = not tested)  CERVICAL SPECIAL TESTS:  Neck flexor muscle endurance test: Positive 15s hold due to weakness  FUNCTIONAL TESTS:  5 times sit to stand: 12s  TODAY'S TREATMENT:    [facility] Adult PT Treatment:                                                DATE: 02/25/23 Therapeutic Exercise: UBE level 1 3'/3' fwd/bwd while gathering subjective High/low rows 20# x10 Median nerve glide x10 Rt Seated horizontal abduction RTB x10 Seated BIL ER with scap retraction RTB x10 Seated cervical ext over towel x15 Seated upper trap stretch 2x30" BIL Seated levator scap stretch 2x30" BIL Manual Therapy: STM/MPTR BIL upper traps, cervical paraspinals SO release        [facility] Adult PT Treatment:                                                DATE: 02/19/23 Therapeutic Exercise: UBE level 1 3'/3' fwd/bwd while gathering subjective High/low rows 20# x10 Standing horizontal abduction back against wall RTB x10 Standing diagonals back against wall RTB x10 BIL Seated BIL ER with scap retraction RTB x10 Seated upper trap stretch 2x30" BIL Seated levator scap stretch 2x30" BIL Manual Therapy: STM/MPTR BIL upper traps, cervical paraspinals Positional release BIL upper traps SO release Self Care: Theracane self use instruction  DATE: 02/04/23 Eval   PATIENT EDUCATION:  Education details: Discussed eval findings, rehab rationale and POC and patient is in agreement  Person educated: Patient Education method: Explanation Education comprehension: verbalized understanding and needs further education  HOME EXERCISE PROGRAM: Access Code: [email or web address] URL: [email or web address] Date: 02/04/2023 Prepared by: Gustavus Bryant  Exercises - Shoulder External Rotation and Scapular Retraction with Resistance  - 2 x daily - 5 x weekly - 2 sets - 15 reps - Standing Shoulder Horizontal Abduction with Resistance  - 2 x daily - 5 x weekly - 2 sets - 15 reps  ASSESSMENT:  CLINICAL IMPRESSION: Patient presents to PT reporting increased pain in her Rt shoulder and Rt side of her neck since last session. Session today continued to focus on periscapular strengthening, though slightly decreased resistance today to accommodate increased pain. Continued with manual techniques to decrease tension. Patient continues to benefit from skilled PT services and should be progressed as able to improve functional independence.    OBJECTIVE IMPAIRMENTS: decreased activity tolerance, decreased knowledge of condition, decreased mobility, decreased ROM, decreased strength, impaired UE functional use, postural dysfunction, and pain.   ACTIVITY LIMITATIONS: carrying, lifting, and reach over head  PERSONAL FACTORS: Fitness, Time since onset of injury/illness/exacerbation, and 1 comorbidity: R RC pathology  are also affecting patient's functional outcome.   REHAB POTENTIAL: Good  CLINICAL DECISION MAKING: Stable/uncomplicated  EVALUATION COMPLEXITY: Low   GOALS: Goals reviewed with patient? No  SHORT TERM GOALS: Target date: 03/18/2023  Patient to demonstrate independence in HEP  Baseline:  [id number] Goal status: INITIAL  2.  Increase L  lateral flexion to 50% Baseline:  Active ROM A/PROM (deg) eval  Flexion 50%  Extension 50%  Right lateral flexion 90%  Left lateral flexion 25%  Right rotation 75%  Left rotation 50%   Goal status: INITIAL   LONG TERM GOALS: Target date: 03/18/2023  Patient will score at least 53% on FOTO to signify clinically meaningful improvement in functional abilities.   Baseline: 43% Goal status: INITIAL  2.  Increase cervical ROM to 75% throughout Baseline:  Active ROM A/PROM (deg) eval  Flexion 50%  Extension 50%  Right lateral flexion 90%  Left lateral flexion 25%  Right rotation 75%  Left rotation 50%   Goal status: INITIAL  3.  Decrease worst pain to 6/10 Baseline: 8/10 Goal status: INITIAL  4.  Decrease tenderness in R UT, SCM, infraspinatus and scalene groups to minimal Baseline: moderate Goal status: INITIAL   PLAN:  PT FREQUENCY: 1-2x/week  PT DURATION: 6 weeks  PLANNED INTERVENTIONS: Therapeutic exercises, Therapeutic activity, Neuromuscular re-education, Balance training, Gait training, Patient/Family education, Self Care, Joint mobilization, Dry Needling, Electrical stimulation, Spinal mobilization, Cryotherapy, Moist heat, Manual therapy, and Re-evaluation  PLAN FOR NEXT SESSION: HEP review and update, manual techniques as appropriate, aerobic tasks, ROM and flexibility activities, strengthening and PREs, TPDN, gait and balance training as needed     Berta Minor, PTA 02/25/2023, 4:57 PM

## 2023-02-28 ENCOUNTER — Telehealth: Payer: Self-pay

## 2023-02-28 DIAGNOSIS — G894 Chronic pain syndrome: Secondary | ICD-10-CM | POA: Diagnosis not present

## 2023-02-28 DIAGNOSIS — M545 Low back pain, unspecified: Secondary | ICD-10-CM | POA: Diagnosis not present

## 2023-02-28 DIAGNOSIS — R202 Paresthesia of skin: Secondary | ICD-10-CM | POA: Diagnosis not present

## 2023-02-28 DIAGNOSIS — M542 Cervicalgia: Secondary | ICD-10-CM | POA: Diagnosis not present

## 2023-02-28 DIAGNOSIS — M5412 Radiculopathy, cervical region: Secondary | ICD-10-CM | POA: Diagnosis not present

## 2023-02-28 DIAGNOSIS — Z79891 Long term (current) use of opiate analgesic: Secondary | ICD-10-CM | POA: Diagnosis not present

## 2023-02-28 DIAGNOSIS — G8929 Other chronic pain: Secondary | ICD-10-CM | POA: Diagnosis not present

## 2023-02-28 NOTE — Telephone Encounter (Signed)
Medicaid Managed Care   Unsuccessful Outreach Note  02/28/2023 Name: [name] MRN: [id number] DOB: 1976-04-27  Referred by: [name], FNP Reason for referral : No chief complaint on file.   An unsuccessful telephone outreach was attempted today. The patient was referred to the case management team for assistance with care management and care coordination.   Follow Up Plan: If patient returns call to provider office, please advise to call Embedded Care Management Care Guide Nicholes Rough* at [phone]*  Nicholes Rough, CMA Care Guide [employer]

## 2023-03-03 DIAGNOSIS — F411 Generalized anxiety disorder: Secondary | ICD-10-CM | POA: Diagnosis not present

## 2023-03-03 DIAGNOSIS — F330 Major depressive disorder, recurrent, mild: Secondary | ICD-10-CM | POA: Diagnosis not present

## 2023-03-03 NOTE — Therapy (Unsigned)
OUTPATIENT PHYSICAL THERAPY TREATMENT NOTE   Patient Name: [name] MRN: [id number] DOB:1975-12-31, 47 y.o., female Today's Date: 03/04/2023  END OF SESSION:  PT End of Session - 03/04/23 1623     Visit Number 4    Number of Visits 12    Date for PT Re-Evaluation 04/06/23    Authorization Type MCD    Authorization Time Period 7 visits approved 02/17/23-04/17/23    Authorization - Number of Visits 7    PT Start Time 1620    PT Stop Time 1700    PT Time Calculation (min) 40 min    Activity Tolerance Patient tolerated treatment well;Patient limited by pain    Behavior During Therapy [facility] for tasks assessed/performed                Past Medical History:  Diagnosis Date   Allergy    Anemia    Anxiety    Apnea 12/08/2019   Arthritis    Asthma    Back pain    Constipation    Exertional dyspnea 03/14/2020   Fatty liver    GERD (gastroesophageal reflux disease)    H/O: hysterectomy    Hx of endometriosis    Inappropriate sinus tachycardia (HCC) 12/08/2019   Insomnia    Joint pain    Lactose intolerance    Migraine    Morbid obesity (HCC) 12/08/2019   Osteoporosis    Pneumonia    Pre-diabetes    Urticaria    Vitamin D deficiency    Past Surgical History:  Procedure Laterality Date   78 HOUR PH STUDY N/A 01/12/2018   Procedure: 24 HOUR PH STUDY;  Surgeon: Napoleon Form, MD;  Location: [facility];  Service: Endoscopy;  Laterality: N/A;   ABDOMINAL HYSTERECTOMY  12/12/2003   ADENOIDECTOMY  05/2011   ANKLE ARTHROSCOPY  12/29/2007   right; with extensive debridement   BLADDER NECK RECONSTRUCTION  01/14/2011   Procedure: BLADDER NECK REPAIR;  Surgeon: Reva Bores, MD;  Location: [facility];  Service: Gynecology;  Laterality: N/A;  Laparoscopic Repair of Incidental Cystotomy   BREAST REDUCTION SURGERY  08/05/2011   Procedure: MAMMARY REDUCTION  (BREAST);  Surgeon: Louisa Second, MD;  Location: [facility];  Service: Plastics;   Laterality: Bilateral;  bilateral   CESAREAN SECTION  12/29/2000; 1994   CHOLECYSTECTOMY     age 46   DILATION AND CURETTAGE OF UTERUS  07/08/2003   open laparoscopy   ESOPHAGEAL MANOMETRY N/A 01/12/2018   Procedure: ESOPHAGEAL MANOMETRY (EM);  Surgeon: Napoleon Form, MD;  Location: [facility];  Service: Endoscopy;  Laterality: N/A;   GASTRIC ROUX-EN-Y N/A 03/19/2022   Procedure: LAPAROSCOPIC ROUX-EN-Y GASTRIC BYPASS WITH UPPER ENDOSCOPY;  Surgeon: Gaynelle Adu, MD;  Location: [facility];  Service: General;  Laterality: N/A;   GIVENS CAPSULE STUDY N/A 06/23/2017   Procedure: GIVENS CAPSULE STUDY;  Surgeon: Benancio Deeds, MD;  Location: [facility];  Service: Gastroenterology;  Laterality: N/A;   HIATAL HERNIA REPAIR N/A 03/19/2022   Procedure: HERNIA REPAIR HIATAL;  Surgeon: Gaynelle Adu, MD;  Location: [facility];  Service: General;  Laterality: N/A;   LAPAROSCOPIC SALPINGOOPHERECTOMY  01/14/2011   left   LAPAROSCOPY N/A 11/01/2022   Procedure: LAPAROSCOPY DIAGNOSTIC; LAPARSCOPIC CLOSURE OF PETERSEN SPACE;  Surgeon: Gaynelle Adu, MD;  Location: [facility];  Service: General;  Laterality: N/A;  DR Gerrit Friends TO ASSIST   PH IMPEDANCE STUDY N/A 01/12/2018   Procedure: PH IMPEDANCE STUDY;  Surgeon: Napoleon Form, MD;  Location: [facility];  Service: Endoscopy;  Laterality: N/A;   RESECTION DISTAL CLAVICAL Right 09/25/2017   Procedure: RESECTION DISTAL CLAVICAL;  Surgeon: Bjorn Pippin, MD;  Location: [facility];  Service: Orthopedics;  Laterality: Right;   RIGHT OOPHORECTOMY  2011   with lysis of adhesions   SHOULDER ACROMIOPLASTY Right 09/25/2017   Procedure: SHOULDER ACROMIOPLASTY;  Surgeon: Bjorn Pippin, MD;  Location: [facility];  Service: Orthopedics;  Laterality: Right;   SHOULDER ARTHROSCOPY WITH DEBRIDEMENT AND BICEP TENDON REPAIR Right 09/25/2017   Procedure: SHOULDER ARTHROSCOPY WITH DEBRIDEMENT AND BICEP TENDON REPAIR;  Surgeon: Bjorn Pippin,  MD;  Location: [facility];  Service: Orthopedics;  Laterality: Right;   SHOULDER ARTHROSCOPY WITH ROTATOR CUFF REPAIR Right 09/25/2017   Procedure: SHOULDER ARTHROSCOPY WITH ROTATOR CUFF REPAIR;  Surgeon: Bjorn Pippin, MD;  Location: [facility];  Service: Orthopedics;  Laterality: Right;   TONSILLECTOMY  as a child   TUBAL LIGATION  12/29/2000   UPPER GASTROINTESTINAL ENDOSCOPY     UPPER GI ENDOSCOPY N/A 11/01/2022   Procedure: UPPER GI ENDOSCOPY;  Surgeon: Gaynelle Adu, MD;  Location: [facility];  Service: General;  Laterality: N/A;   Patient Active Problem List   Diagnosis Date Noted   Class 1 obesity due to excess calories with serious comorbidity and body mass index (BMI) of 33.0 to 33.9 in adult 11/05/2022   COVID-19 vaccination declined 11/05/2022   Abdominal pain 08/21/2022   Anemia of pregnancy 08/21/2022   Chronic pelvic pain in female 08/21/2022   History of cesarean section 08/21/2022   Irregular intermenstrual bleeding 08/21/2022   S/P gastric bypass 03/19/2022   Microcytic anemia 12/14/2021   Iron deficiency anemia 12/14/2021   Adverse food reaction 03/21/2021   Depressive disorder 01/10/2021   Osteoporosis 01/10/2021   Thyroid nodule 01/10/2021   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   Ascites 10/05/2020   Muscle tension dysphonia 08/10/2020   Other insomnia 07/27/2020   At risk for side effect of medication 07/27/2020   Other hyperlipidemia 05/15/2020   At risk for osteoporosis 05/15/2020   At risk for impaired metabolic function 04/04/2020   Other fatigue 03/22/2020   SOBOE (shortness of breath on exertion) 03/22/2020   Exertional dyspnea 03/14/2020   Cyst of ovary 03/11/2020   Endometriosis 03/11/2020   Herpes simplex type 2 infection 03/11/2020   Neuropathy, right side of face 03/08/2020   At risk for depression 03/08/2020   Primary osteoarthritis of both feet 02/26/2020   Hyperlipidemia 01/26/2020   At risk for heart disease 01/26/2020    Daytime somnolence 01/26/2020   Abnormal glucose 01/26/2020   Vitamin D deficiency 01/04/2020   Fatty liver 01/04/2020   B12 deficiency 01/04/2020   Moderate persistent asthma without complication 12/31/2019   Inappropriate sinus tachycardia (HCC) 12/08/2019   Apnea 12/08/2019   Anaphylactic reaction due to food, subsequent encounter 11/22/2019   Idiopathic urticaria 11/22/2019   Generalized anxiety disorder 04/08/2018   Atypical chest pain    Regurgitation of food    Laryngopharyngeal reflux (LPR) 07/30/2017   ANA positive 12/30/2016   Gastroesophageal reflux disease 06/27/2016   Disturbance of skin sensation 04/27/2014   Dizziness and giddiness 03/14/2014   Headache, migraine 03/14/2014   Myalgia and myositis, unspecified 06/07/2013   Nausea and vomiting in adult 08/22/2012   Pelvic pain in female 12/18/2010   ANEMIA-IRON DEFICIENCY 10/15/2006   Genital herpes 09/30/2006   Obesity (BMI 35.0-39.9 without comorbidity) 09/30/2006   Nonorganic sleep disorder  09/30/2006   Constipation 09/30/2006   NEURITIS, LUMBOSACRAL NOS 09/30/2006   Personal history of mental disorder 09/30/2006    PCP: [name], FNP   REFERRING PROVIDER: Teryl Lucy, MD  REFERRING DIAG: CERVICALGIA WITH RADICULOPATHY  THERAPY DIAG:  Cervicalgia  Muscle weakness (generalized)  Abnormal posture  Rationale for Evaluation and Treatment: Rehabilitation  ONSET DATE: chronic  SUBJECTIVE:                                                                                                                                                                                                         SUBJECTIVE STATEMENT: No change in symptoms to report.  Feels R shoulder pain begins at R side of neck and then extends into anterior and posterior.  PERTINENT HISTORY:  HPI patient is a pleasant 47 year old female who comes in today with continued right shoulder and neck pain.  We have seen her several times for  this.  She has had 2 previous right shoulder surgeries without significant relief of symptoms.  She has had subsequent subacromial and glenohumeral cortisone injections without much relief.  MRI of the cervical spine and shoulder were obtained back in August of this year.  Neck MRI showed minimal cervical spine changes without any disc bulge or spinal canal or neural foraminal narrowing.  Right shoulder MRI showed mild supraspinatus tendinopathy.  No other findings.  She has been seeing Dr. Titus Dubin as she is previously had a positive ANA with sicca symptoms.  She is currently being referred to [facility] rheumatology for further work-up.   Review of Systems as detailed in HPI.  All others reviewed and are negative.  PAIN:  Are you having pain? Yes: NPRS scale: 5/10 Pain location: R shoulder/neck Pain description: ache Aggravating factors: activity, OH reaching Relieving factors: rest  PRECAUTIONS: None  RED FLAGS: None     WEIGHT BEARING RESTRICTIONS: No  FALLS:  Has patient fallen in last 6 months? No  OCCUPATION: bus driver  PLOF: Independent  PATIENT GOALS: To manage my neck pain  NEXT MD VISIT: 11/24  OBJECTIVE:  Note: Objective measures were completed at Evaluation unless otherwise noted.  DIAGNOSTIC FINDINGS:  None available  PATIENT SURVEYS:  FOTO 43(53 predicted)  POSTURE:  elevated R shoulder  PALPATION: TTP R SCM, scalenes, UT and infraspinatus    CERVICAL ROM:   Active ROM A/PROM (deg) eval  Flexion 50%  Extension 50%  Right lateral flexion 90%  Left lateral flexion 25%  Right rotation 75%  Left rotation 50%   (Blank rows = not tested)  UPPER  EXTREMITY ROM:  Active ROM Right eval Left eval  Shoulder flexion 160 170  Shoulder extension    Shoulder abduction 150 170  Shoulder adduction    Shoulder extension    Shoulder internal rotation [facility] WFL  Shoulder external rotation [facility]  Elbow flexion    Elbow extension    Wrist flexion    Wrist  extension    Wrist ulnar deviation    Wrist radial deviation    Wrist pronation    Wrist supination     (Blank rows = not tested)  UPPER EXTREMITY MMT: WFL throughout  MMT Right eval Left eval  Shoulder flexion    Shoulder extension    Shoulder abduction    Shoulder adduction    Shoulder extension    Shoulder internal rotation    Shoulder external rotation    Middle trapezius    Lower trapezius    Elbow flexion    Elbow extension    Wrist flexion    Wrist extension    Wrist ulnar deviation    Wrist radial deviation    Wrist pronation    Wrist supination    Grip strength     (Blank rows = not tested)  CERVICAL SPECIAL TESTS:  Neck flexor muscle endurance test: Positive 15s hold due to weakness  FUNCTIONAL TESTS:  5 times sit to stand: 12s  TODAY'S TREATMENT:    [facility] Adult PT Treatment:                                                DATE: 03/04/23 Therapeutic Exercise: Nustep L4 6 min Scaption on wall 15x R UT/scalene stretch 30s x2 ER YTB 15x Hor Abd YTB 15x Standing B scaption YTB 15x Scaption on wall 15x Manual Therapy: STM R pec minor and infraspinatus ROM R shoulder R scalene stretch 30s x3  [facility] Adult PT Treatment:                                                DATE: 02/25/23 Therapeutic Exercise: UBE level 1 3'/3' fwd/bwd while gathering subjective High/low rows 20# x10 Median nerve glide x10 Rt Seated horizontal abduction RTB x10 Seated BIL ER with scap retraction RTB x10 Seated cervical ext over towel x15 Seated upper trap stretch 2x30" BIL Seated levator scap stretch 2x30" BIL Manual Therapy: STM/MPTR BIL upper traps, cervical paraspinals SO release        [facility] Adult PT Treatment:                                                DATE: 02/19/23 Therapeutic Exercise: UBE level 1 3'/3' fwd/bwd while gathering subjective High/low rows 20# x10 Standing horizontal abduction back against wall RTB x10 Standing diagonals back against wall RTB x10  BIL Seated BIL ER with scap retraction RTB x10 Seated upper trap stretch 2x30" BIL Seated levator scap stretch 2x30" BIL Manual Therapy: STM/MPTR BIL upper traps, cervical paraspinals Positional release BIL upper traps SO release Self Care: Theracane self use instruction  DATE: 02/04/23 Eval   PATIENT EDUCATION:  Education details: Discussed eval findings, rehab rationale and POC and patient is in agreement  Person educated: Patient Education method: Explanation Education comprehension: verbalized understanding and needs further education  HOME EXERCISE PROGRAM: Access Code: [email or web address] URL: [email or web address] Date: 03/04/2023 Prepared by: Gustavus Bryant  Exercises - Shoulder External Rotation and Scapular Retraction with Resistance  - 2 x daily - 5 x weekly - 2 sets - 15 reps - Standing Shoulder Horizontal Abduction with Resistance  - 2 x daily - 5 x weekly - 2 sets - 15 reps - Scaption Wall Slide with Towel  - 2 x daily - 5 x weekly - 2 sets - 15 reps - Standing Shoulder Scaption with Resistance  - 2 x daily - 5 x weekly - 2 sets - 15 reps  ASSESSMENT:  CLINICAL IMPRESSION: Continued shoulder and neck pain/symptoms.  Focus of today was posterior shoulder strengthening and cervical stretches.  Attention paid to R scalene group and supraspinatus.  Altered shoulder mechanics and tight scalenes most likely contributing to ongoing shoulder, neck and UE Symptoms.   OBJECTIVE IMPAIRMENTS: decreased activity tolerance, decreased knowledge of condition, decreased mobility, decreased ROM, decreased strength, impaired UE functional use, postural dysfunction, and pain.   ACTIVITY LIMITATIONS: carrying, lifting, and reach over head  PERSONAL FACTORS: Fitness, Time since onset of injury/illness/exacerbation, and 1 comorbidity: R RC pathology  are also  affecting patient's functional outcome.   REHAB POTENTIAL: Good  CLINICAL DECISION MAKING: Stable/uncomplicated  EVALUATION COMPLEXITY: Low   GOALS: Goals reviewed with patient? No  SHORT TERM GOALS: Target date: 03/18/2023  Patient to demonstrate independence in HEP  Baseline:  [id number] Goal status: INITIAL  2.  Increase L lateral flexion to 50% Baseline:  Active ROM A/PROM (deg) eval  Flexion 50%  Extension 50%  Right lateral flexion 90%  Left lateral flexion 25%  Right rotation 75%  Left rotation 50%   Goal status: INITIAL   LONG TERM GOALS: Target date: 03/18/2023  Patient will score at least 53% on FOTO to signify clinically meaningful improvement in functional abilities.   Baseline: 43% Goal status: INITIAL  2.  Increase cervical ROM to 75% throughout Baseline:  Active ROM A/PROM (deg) eval  Flexion 50%  Extension 50%  Right lateral flexion 90%  Left lateral flexion 25%  Right rotation 75%  Left rotation 50%   Goal status: INITIAL  3.  Decrease worst pain to 6/10 Baseline: 8/10 Goal status: INITIAL  4.  Decrease tenderness in R UT, SCM, infraspinatus and scalene groups to minimal Baseline: moderate Goal status: INITIAL   PLAN:  PT FREQUENCY: 1-2x/week  PT DURATION: 6 weeks  PLANNED INTERVENTIONS: Therapeutic exercises, Therapeutic activity, Neuromuscular re-education, Balance training, Gait training, Patient/Family education, Self Care, Joint mobilization, Dry Needling, Electrical stimulation, Spinal mobilization, Cryotherapy, Moist heat, Manual therapy, and Re-evaluation  PLAN FOR NEXT SESSION: HEP review and update, manual techniques as appropriate, aerobic tasks, ROM and flexibility activities, strengthening and PREs, TPDN, gait and balance training as needed     Hildred Laser, PT 03/04/2023, 5:04 PM

## 2023-03-04 ENCOUNTER — Ambulatory Visit: Payer: Medicaid Other

## 2023-03-04 DIAGNOSIS — M6281 Muscle weakness (generalized): Secondary | ICD-10-CM | POA: Diagnosis not present

## 2023-03-04 DIAGNOSIS — M542 Cervicalgia: Secondary | ICD-10-CM

## 2023-03-04 DIAGNOSIS — R293 Abnormal posture: Secondary | ICD-10-CM | POA: Diagnosis not present

## 2023-03-05 NOTE — Therapy (Unsigned)
OUTPATIENT PHYSICAL THERAPY TREATMENT NOTE   Patient Name: [name] MRN: [id number] DOB:1976/02/02, 47 y.o., female Today's Date: 03/06/2023  END OF SESSION:  PT End of Session - 03/06/23 1617     Visit Number 4    Number of Visits 12    Date for PT Re-Evaluation 04/06/23    Authorization Type MCD    Authorization Time Period 7 visits approved 02/17/23-04/17/23    Authorization - Visit Number 3    Authorization - Number of Visits 7    PT Start Time 1615    PT Stop Time 1700    PT Time Calculation (min) 45 min    Activity Tolerance Patient tolerated treatment well;Patient limited by pain    Behavior During Therapy [facility] for tasks assessed/performed                 Past Medical History:  Diagnosis Date   Allergy    Anemia    Anxiety    Apnea 12/08/2019   Arthritis    Asthma    Back pain    Constipation    Exertional dyspnea 03/14/2020   Fatty liver    GERD (gastroesophageal reflux disease)    H/O: hysterectomy    Hx of endometriosis    Inappropriate sinus tachycardia (HCC) 12/08/2019   Insomnia    Joint pain    Lactose intolerance    Migraine    Morbid obesity (HCC) 12/08/2019   Osteoporosis    Pneumonia    Pre-diabetes    Urticaria    Vitamin D deficiency    Past Surgical History:  Procedure Laterality Date   93 HOUR PH STUDY N/A 01/12/2018   Procedure: 24 HOUR PH STUDY;  Surgeon: Napoleon Form, MD;  Location: [facility];  Service: Endoscopy;  Laterality: N/A;   ABDOMINAL HYSTERECTOMY  12/12/2003   ADENOIDECTOMY  05/2011   ANKLE ARTHROSCOPY  12/29/2007   right; with extensive debridement   BLADDER NECK RECONSTRUCTION  01/14/2011   Procedure: BLADDER NECK REPAIR;  Surgeon: Reva Bores, MD;  Location: [facility];  Service: Gynecology;  Laterality: N/A;  Laparoscopic Repair of Incidental Cystotomy   BREAST REDUCTION SURGERY  08/05/2011   Procedure: MAMMARY REDUCTION  (BREAST);  Surgeon: Louisa Second, MD;  Location: [facility];  Service: Plastics;  Laterality: Bilateral;  bilateral   CESAREAN SECTION  12/29/2000; 1994   CHOLECYSTECTOMY     age 65   DILATION AND CURETTAGE OF UTERUS  07/08/2003   open laparoscopy   ESOPHAGEAL MANOMETRY N/A 01/12/2018   Procedure: ESOPHAGEAL MANOMETRY (EM);  Surgeon: Napoleon Form, MD;  Location: [facility];  Service: Endoscopy;  Laterality: N/A;   GASTRIC ROUX-EN-Y N/A 03/19/2022   Procedure: LAPAROSCOPIC ROUX-EN-Y GASTRIC BYPASS WITH UPPER ENDOSCOPY;  Surgeon: Gaynelle Adu, MD;  Location: [facility];  Service: General;  Laterality: N/A;   GIVENS CAPSULE STUDY N/A 06/23/2017   Procedure: GIVENS CAPSULE STUDY;  Surgeon: Benancio Deeds, MD;  Location: [facility];  Service: Gastroenterology;  Laterality: N/A;   HIATAL HERNIA REPAIR N/A 03/19/2022   Procedure: HERNIA REPAIR HIATAL;  Surgeon: Gaynelle Adu, MD;  Location: [facility];  Service: General;  Laterality: N/A;   LAPAROSCOPIC SALPINGOOPHERECTOMY  01/14/2011   left   LAPAROSCOPY N/A 11/01/2022   Procedure: LAPAROSCOPY DIAGNOSTIC; LAPARSCOPIC CLOSURE OF PETERSEN SPACE;  Surgeon: Gaynelle Adu, MD;  Location: [facility];  Service: General;  Laterality: N/A;  DR Gerrit Friends TO ASSIST   PH IMPEDANCE STUDY N/A 01/12/2018   Procedure:  PH IMPEDANCE STUDY;  Surgeon: Napoleon Form, MD;  Location: [facility];  Service: Endoscopy;  Laterality: N/A;   RESECTION DISTAL CLAVICAL Right 09/25/2017   Procedure: RESECTION DISTAL CLAVICAL;  Surgeon: Bjorn Pippin, MD;  Location: [facility];  Service: Orthopedics;  Laterality: Right;   RIGHT OOPHORECTOMY  2011   with lysis of adhesions   SHOULDER ACROMIOPLASTY Right 09/25/2017   Procedure: SHOULDER ACROMIOPLASTY;  Surgeon: Bjorn Pippin, MD;  Location: [facility];  Service: Orthopedics;  Laterality: Right;   SHOULDER ARTHROSCOPY WITH DEBRIDEMENT AND BICEP TENDON REPAIR Right 09/25/2017   Procedure: SHOULDER ARTHROSCOPY WITH DEBRIDEMENT AND BICEP TENDON  REPAIR;  Surgeon: Bjorn Pippin, MD;  Location: [facility];  Service: Orthopedics;  Laterality: Right;   SHOULDER ARTHROSCOPY WITH ROTATOR CUFF REPAIR Right 09/25/2017   Procedure: SHOULDER ARTHROSCOPY WITH ROTATOR CUFF REPAIR;  Surgeon: Bjorn Pippin, MD;  Location: [facility];  Service: Orthopedics;  Laterality: Right;   TONSILLECTOMY  as a child   TUBAL LIGATION  12/29/2000   UPPER GASTROINTESTINAL ENDOSCOPY     UPPER GI ENDOSCOPY N/A 11/01/2022   Procedure: UPPER GI ENDOSCOPY;  Surgeon: Gaynelle Adu, MD;  Location: [facility];  Service: General;  Laterality: N/A;   Patient Active Problem List   Diagnosis Date Noted   Class 1 obesity due to excess calories with serious comorbidity and body mass index (BMI) of 33.0 to 33.9 in adult 11/05/2022   COVID-19 vaccination declined 11/05/2022   Abdominal pain 08/21/2022   Anemia of pregnancy 08/21/2022   Chronic pelvic pain in female 08/21/2022   History of cesarean section 08/21/2022   Irregular intermenstrual bleeding 08/21/2022   S/P gastric bypass 03/19/2022   Microcytic anemia 12/14/2021   Iron deficiency anemia 12/14/2021   Adverse food reaction 03/21/2021   Depressive disorder 01/10/2021   Osteoporosis 01/10/2021   Thyroid nodule 01/10/2021   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   Ascites 10/05/2020   Muscle tension dysphonia 08/10/2020   Other insomnia 07/27/2020   At risk for side effect of medication 07/27/2020   Other hyperlipidemia 05/15/2020   At risk for osteoporosis 05/15/2020   At risk for impaired metabolic function 04/04/2020   Other fatigue 03/22/2020   SOBOE (shortness of breath on exertion) 03/22/2020   Exertional dyspnea 03/14/2020   Cyst of ovary 03/11/2020   Endometriosis 03/11/2020   Herpes simplex type 2 infection 03/11/2020   Neuropathy, right side of face 03/08/2020   At risk for depression 03/08/2020   Primary osteoarthritis of both feet 02/26/2020   Hyperlipidemia 01/26/2020   At risk  for heart disease 01/26/2020   Daytime somnolence 01/26/2020   Abnormal glucose 01/26/2020   Vitamin D deficiency 01/04/2020   Fatty liver 01/04/2020   B12 deficiency 01/04/2020   Moderate persistent asthma without complication 12/31/2019   Inappropriate sinus tachycardia (HCC) 12/08/2019   Apnea 12/08/2019   Anaphylactic reaction due to food, subsequent encounter 11/22/2019   Idiopathic urticaria 11/22/2019   Generalized anxiety disorder 04/08/2018   Atypical chest pain    Regurgitation of food    Laryngopharyngeal reflux (LPR) 07/30/2017   ANA positive 12/30/2016   Gastroesophageal reflux disease 06/27/2016   Disturbance of skin sensation 04/27/2014   Dizziness and giddiness 03/14/2014   Headache, migraine 03/14/2014   Myalgia and myositis, unspecified 06/07/2013   Nausea and vomiting in adult 08/22/2012   Pelvic pain in female 12/18/2010   ANEMIA-IRON DEFICIENCY 10/15/2006   Genital herpes 09/30/2006   Obesity (  BMI 35.0-39.9 without comorbidity) 09/30/2006   Nonorganic sleep disorder 09/30/2006   Constipation 09/30/2006   NEURITIS, LUMBOSACRAL NOS 09/30/2006   Personal history of mental disorder 09/30/2006    PCP: [name], FNP   REFERRING PROVIDER: Teryl Lucy, MD  REFERRING DIAG: CERVICALGIA WITH RADICULOPATHY  THERAPY DIAG:  Cervicalgia  Muscle weakness (generalized)  Abnormal posture  Rationale for Evaluation and Treatment: Rehabilitation  ONSET DATE: chronic  SUBJECTIVE:                                                                                                                                                                                                         SUBJECTIVE STATEMENT: Some relief after last session lasting until bedtime.  Symptoms resumed in AM.  Has been compliant with HEP  PERTINENT HISTORY:  HPI patient is a pleasant 47 year old female who comes in today with continued right shoulder and neck pain.  We have seen her several  times for this.  She has had 2 previous right shoulder surgeries without significant relief of symptoms.  She has had subsequent subacromial and glenohumeral cortisone injections without much relief.  MRI of the cervical spine and shoulder were obtained back in August of this year.  Neck MRI showed minimal cervical spine changes without any disc bulge or spinal canal or neural foraminal narrowing.  Right shoulder MRI showed mild supraspinatus tendinopathy.  No other findings.  She has been seeing Dr. Titus Dubin as she is previously had a positive ANA with sicca symptoms.  She is currently being referred to [facility] rheumatology for further work-up.   Review of Systems as detailed in HPI.  All others reviewed and are negative.  PAIN:  Are you having pain? Yes: NPRS scale: 5/10 Pain location: R shoulder/neck Pain description: ache Aggravating factors: activity, OH reaching Relieving factors: rest  PRECAUTIONS: None  RED FLAGS: None     WEIGHT BEARING RESTRICTIONS: No  FALLS:  Has patient fallen in last 6 months? No  OCCUPATION: bus driver  PLOF: Independent  PATIENT GOALS: To manage my neck pain  NEXT MD VISIT: 11/24  OBJECTIVE:  Note: Objective measures were completed at Evaluation unless otherwise noted.  DIAGNOSTIC FINDINGS:  None available  PATIENT SURVEYS:  FOTO 43(53 predicted)  POSTURE:  elevated R shoulder  PALPATION: TTP R SCM, scalenes, UT and infraspinatus    CERVICAL ROM:   Active ROM A/PROM (deg) eval  Flexion 50%  Extension 50%  Right lateral flexion 90%  Left lateral flexion 25%  Right rotation 75%  Left rotation 50%   (Blank rows =  not tested)  UPPER EXTREMITY ROM:  Active ROM Right eval Left eval  Shoulder flexion 160 170  Shoulder extension    Shoulder abduction 150 170  Shoulder adduction    Shoulder extension    Shoulder internal rotation [facility] WFL  Shoulder external rotation [facility]  Elbow flexion    Elbow extension    Wrist flexion     Wrist extension    Wrist ulnar deviation    Wrist radial deviation    Wrist pronation    Wrist supination     (Blank rows = not tested)  UPPER EXTREMITY MMT: WFL throughout  MMT Right eval Left eval  Shoulder flexion    Shoulder extension    Shoulder abduction    Shoulder adduction    Shoulder extension    Shoulder internal rotation    Shoulder external rotation    Middle trapezius    Lower trapezius    Elbow flexion    Elbow extension    Wrist flexion    Wrist extension    Wrist ulnar deviation    Wrist radial deviation    Wrist pronation    Wrist supination    Grip strength     (Blank rows = not tested)  CERVICAL SPECIAL TESTS:  Neck flexor muscle endurance test: Positive 15s hold due to weakness  FUNCTIONAL TESTS:  5 times sit to stand: 12s  TODAY'S TREATMENT:    [facility] Adult PT Treatment:                                                DATE: 06/05/22 Therapeutic Exercise: Nustep L4 6 min Scaption on wall 15x R UT/scalene stretch 30s x2 ER RTB 15x Hor Abd RTB 15x Supine hor abd RTB 15x B, 15/15 unilaterally Prone extension 1# 15x Prone hor abd 1# 15x Prone flexion 1# 15x  Manual Therapy: R pec minor release 4 way scapula R scalene stretch 30s x3  [facility] Adult PT Treatment:                                                DATE: 03/04/23 Therapeutic Exercise: Nustep L4 6 min Scaption on wall 15x R UT/scalene stretch 30s x2 ER YTB 15x Hor Abd YTB 15x Standing B scaption YTB 15x Scaption on wall 15x Manual Therapy: STM R pec minor and infraspinatus ROM R shoulder R scalene stretch 30s x3  [facility] Adult PT Treatment:                                                DATE: 02/25/23 Therapeutic Exercise: UBE level 1 3'/3' fwd/bwd while gathering subjective High/low rows 20# x10 Median nerve glide x10 Rt Seated horizontal abduction RTB x10 Seated BIL ER with scap retraction RTB x10 Seated cervical ext over towel x15 Seated upper trap stretch 2x30"  BIL Seated levator scap stretch 2x30" BIL Manual Therapy: STM/MPTR BIL upper traps, cervical paraspinals SO release        [facility] Adult PT Treatment:  DATE: 02/19/23 Therapeutic Exercise: UBE level 1 3'/3' fwd/bwd while gathering subjective High/low rows 20# x10 Standing horizontal abduction back against wall RTB x10 Standing diagonals back against wall RTB x10 BIL Seated BIL ER with scap retraction RTB x10 Seated upper trap stretch 2x30" BIL Seated levator scap stretch 2x30" BIL Manual Therapy: STM/MPTR BIL upper traps, cervical paraspinals Positional release BIL upper traps SO release Self Care: Theracane self use instruction                                                                                                                        DATE: 02/04/23 Eval   PATIENT EDUCATION:  Education details: Discussed eval findings, rehab rationale and POC and patient is in agreement  Person educated: Patient Education method: Explanation Education comprehension: verbalized understanding and needs further education  HOME EXERCISE PROGRAM: Access Code: [email or web address] URL: [email or web address] Date: 03/04/2023 Prepared by: Gustavus Bryant  Exercises - Shoulder External Rotation and Scapular Retraction with Resistance  - 2 x daily - 5 x weekly - 2 sets - 15 reps - Standing Shoulder Horizontal Abduction with Resistance  - 2 x daily - 5 x weekly - 2 sets - 15 reps - Scaption Wall Slide with Towel  - 2 x daily - 5 x weekly - 2 sets - 15 reps - Standing Shoulder Scaption with Resistance  - 2 x daily - 5 x weekly - 2 sets - 15 reps  ASSESSMENT:  CLINICAL IMPRESSION: Today's session focused on posterior shoulder strengthening and postural correction followed by manual cervical stretching.  Weakness identified in posterior shoulder girdle stabilizers with scapular winging observed as well as PT ability to distract scapula off of  thorax.  Added prone strengthening tasks to resolve.   OBJECTIVE IMPAIRMENTS: decreased activity tolerance, decreased knowledge of condition, decreased mobility, decreased ROM, decreased strength, impaired UE functional use, postural dysfunction, and pain.   ACTIVITY LIMITATIONS: carrying, lifting, and reach over head  PERSONAL FACTORS: Fitness, Time since onset of injury/illness/exacerbation, and 1 comorbidity: R RC pathology  are also affecting patient's functional outcome.   REHAB POTENTIAL: Good  CLINICAL DECISION MAKING: Stable/uncomplicated  EVALUATION COMPLEXITY: Low   GOALS: Goals reviewed with patient? No  SHORT TERM GOALS: Target date: 03/18/2023  Patient to demonstrate independence in HEP  Baseline:  [id number] Goal status: INITIAL  2.  Increase L lateral flexion to 50% Baseline:  Active ROM A/PROM (deg) eval  Flexion 50%  Extension 50%  Right lateral flexion 90%  Left lateral flexion 25%  Right rotation 75%  Left rotation 50%   Goal status: INITIAL   LONG TERM GOALS: Target date: 03/18/2023  Patient will score at least 53% on FOTO to signify clinically meaningful improvement in functional abilities.   Baseline: 43% Goal status: INITIAL  2.  Increase cervical ROM to 75% throughout Baseline:  Active ROM A/PROM (deg) eval  Flexion 50%  Extension 50%  Right lateral flexion 90%  Left lateral flexion 25%  Right rotation 75%  Left  rotation 50%   Goal status: INITIAL  3.  Decrease worst pain to 6/10 Baseline: 8/10 Goal status: INITIAL  4.  Decrease tenderness in R UT, SCM, infraspinatus and scalene groups to minimal Baseline: moderate Goal status: INITIAL   PLAN:  PT FREQUENCY: 1-2x/week  PT DURATION: 6 weeks  PLANNED INTERVENTIONS: Therapeutic exercises, Therapeutic activity, Neuromuscular re-education, Balance training, Gait training, Patient/Family education, Self Care, Joint mobilization, Dry Needling, Electrical stimulation, Spinal  mobilization, Cryotherapy, Moist heat, Manual therapy, and Re-evaluation  PLAN FOR NEXT SESSION: HEP review and update, manual techniques as appropriate, aerobic tasks, ROM and flexibility activities, strengthening and PREs, TPDN, gait and balance training as needed     Hildred Laser, PT 03/06/2023, 4:59 PM

## 2023-03-06 ENCOUNTER — Ambulatory Visit: Payer: Medicaid Other

## 2023-03-06 DIAGNOSIS — R293 Abnormal posture: Secondary | ICD-10-CM

## 2023-03-06 DIAGNOSIS — M542 Cervicalgia: Secondary | ICD-10-CM

## 2023-03-06 DIAGNOSIS — M6281 Muscle weakness (generalized): Secondary | ICD-10-CM

## 2023-03-09 NOTE — Therapy (Unsigned)
OUTPATIENT PHYSICAL THERAPY TREATMENT NOTE   Patient Name: [name] MRN: [id number] DOB:07-12-1975, 47 y.o., female Today's Date: 03/10/2023  END OF SESSION:  PT End of Session - 03/10/23 1701     Visit Number 5    Number of Visits 12    Date for PT Re-Evaluation 04/06/23    Authorization Type MCD    Authorization Time Period 7 visits approved 02/17/23-04/17/23    Authorization - Visit Number 4    Authorization - Number of Visits 7    PT Start Time 1700    PT Stop Time 1740    PT Time Calculation (min) 40 min    Activity Tolerance Patient tolerated treatment well;Patient limited by pain    Behavior During Therapy [facility] for tasks assessed/performed                  Past Medical History:  Diagnosis Date   Allergy    Anemia    Anxiety    Apnea 12/08/2019   Arthritis    Asthma    Back pain    Constipation    Exertional dyspnea 03/14/2020   Fatty liver    GERD (gastroesophageal reflux disease)    H/O: hysterectomy    Hx of endometriosis    Inappropriate sinus tachycardia (HCC) 12/08/2019   Insomnia    Joint pain    Lactose intolerance    Migraine    Morbid obesity (HCC) 12/08/2019   Osteoporosis    Pneumonia    Pre-diabetes    Urticaria    Vitamin D deficiency    Past Surgical History:  Procedure Laterality Date   62 HOUR PH STUDY N/A 01/12/2018   Procedure: 24 HOUR PH STUDY;  Surgeon: Napoleon Form, MD;  Location: [facility];  Service: Endoscopy;  Laterality: N/A;   ABDOMINAL HYSTERECTOMY  12/12/2003   ADENOIDECTOMY  05/2011   ANKLE ARTHROSCOPY  12/29/2007   right; with extensive debridement   BLADDER NECK RECONSTRUCTION  01/14/2011   Procedure: BLADDER NECK REPAIR;  Surgeon: Reva Bores, MD;  Location: [facility];  Service: Gynecology;  Laterality: N/A;  Laparoscopic Repair of Incidental Cystotomy   BREAST REDUCTION SURGERY  08/05/2011   Procedure: MAMMARY REDUCTION  (BREAST);  Surgeon: Louisa Second, MD;  Location: [facility];  Service: Plastics;  Laterality: Bilateral;  bilateral   CESAREAN SECTION  12/29/2000; 1994   CHOLECYSTECTOMY     age 4   DILATION AND CURETTAGE OF UTERUS  07/08/2003   open laparoscopy   ESOPHAGEAL MANOMETRY N/A 01/12/2018   Procedure: ESOPHAGEAL MANOMETRY (EM);  Surgeon: Napoleon Form, MD;  Location: [facility];  Service: Endoscopy;  Laterality: N/A;   GASTRIC ROUX-EN-Y N/A 03/19/2022   Procedure: LAPAROSCOPIC ROUX-EN-Y GASTRIC BYPASS WITH UPPER ENDOSCOPY;  Surgeon: Gaynelle Adu, MD;  Location: [facility];  Service: General;  Laterality: N/A;   GIVENS CAPSULE STUDY N/A 06/23/2017   Procedure: GIVENS CAPSULE STUDY;  Surgeon: Benancio Deeds, MD;  Location: [facility];  Service: Gastroenterology;  Laterality: N/A;   HIATAL HERNIA REPAIR N/A 03/19/2022   Procedure: HERNIA REPAIR HIATAL;  Surgeon: Gaynelle Adu, MD;  Location: [facility];  Service: General;  Laterality: N/A;   LAPAROSCOPIC SALPINGOOPHERECTOMY  01/14/2011   left   LAPAROSCOPY N/A 11/01/2022   Procedure: LAPAROSCOPY DIAGNOSTIC; LAPARSCOPIC CLOSURE OF PETERSEN SPACE;  Surgeon: Gaynelle Adu, MD;  Location: [facility];  Service: General;  Laterality: N/A;  DR Gerrit Friends TO ASSIST   PH IMPEDANCE STUDY N/A 01/12/2018  Procedure: PH IMPEDANCE STUDY;  Surgeon: Napoleon Form, MD;  Location: [facility];  Service: Endoscopy;  Laterality: N/A;   RESECTION DISTAL CLAVICAL Right 09/25/2017   Procedure: RESECTION DISTAL CLAVICAL;  Surgeon: Bjorn Pippin, MD;  Location: [facility];  Service: Orthopedics;  Laterality: Right;   RIGHT OOPHORECTOMY  2011   with lysis of adhesions   SHOULDER ACROMIOPLASTY Right 09/25/2017   Procedure: SHOULDER ACROMIOPLASTY;  Surgeon: Bjorn Pippin, MD;  Location: [facility];  Service: Orthopedics;  Laterality: Right;   SHOULDER ARTHROSCOPY WITH DEBRIDEMENT AND BICEP TENDON REPAIR Right 09/25/2017   Procedure: SHOULDER ARTHROSCOPY WITH DEBRIDEMENT AND BICEP  TENDON REPAIR;  Surgeon: Bjorn Pippin, MD;  Location: [facility];  Service: Orthopedics;  Laterality: Right;   SHOULDER ARTHROSCOPY WITH ROTATOR CUFF REPAIR Right 09/25/2017   Procedure: SHOULDER ARTHROSCOPY WITH ROTATOR CUFF REPAIR;  Surgeon: Bjorn Pippin, MD;  Location: [facility];  Service: Orthopedics;  Laterality: Right;   TONSILLECTOMY  as a child   TUBAL LIGATION  12/29/2000   UPPER GASTROINTESTINAL ENDOSCOPY     UPPER GI ENDOSCOPY N/A 11/01/2022   Procedure: UPPER GI ENDOSCOPY;  Surgeon: Gaynelle Adu, MD;  Location: [facility];  Service: General;  Laterality: N/A;   Patient Active Problem List   Diagnosis Date Noted   Class 1 obesity due to excess calories with serious comorbidity and body mass index (BMI) of 33.0 to 33.9 in adult 11/05/2022   COVID-19 vaccination declined 11/05/2022   Abdominal pain 08/21/2022   Anemia of pregnancy 08/21/2022   Chronic pelvic pain in female 08/21/2022   History of cesarean section 08/21/2022   Irregular intermenstrual bleeding 08/21/2022   S/P gastric bypass 03/19/2022   Microcytic anemia 12/14/2021   Iron deficiency anemia 12/14/2021   Adverse food reaction 03/21/2021   Depressive disorder 01/10/2021   Osteoporosis 01/10/2021   Thyroid nodule 01/10/2021   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   Ascites 10/05/2020   Muscle tension dysphonia 08/10/2020   Other insomnia 07/27/2020   At risk for side effect of medication 07/27/2020   Other hyperlipidemia 05/15/2020   At risk for osteoporosis 05/15/2020   At risk for impaired metabolic function 04/04/2020   Other fatigue 03/22/2020   SOBOE (shortness of breath on exertion) 03/22/2020   Exertional dyspnea 03/14/2020   Cyst of ovary 03/11/2020   Endometriosis 03/11/2020   Herpes simplex type 2 infection 03/11/2020   Neuropathy, right side of face 03/08/2020   At risk for depression 03/08/2020   Primary osteoarthritis of both feet 02/26/2020   Hyperlipidemia 01/26/2020    At risk for heart disease 01/26/2020   Daytime somnolence 01/26/2020   Abnormal glucose 01/26/2020   Vitamin D deficiency 01/04/2020   Fatty liver 01/04/2020   B12 deficiency 01/04/2020   Moderate persistent asthma without complication 12/31/2019   Inappropriate sinus tachycardia (HCC) 12/08/2019   Apnea 12/08/2019   Anaphylactic reaction due to food, subsequent encounter 11/22/2019   Idiopathic urticaria 11/22/2019   Generalized anxiety disorder 04/08/2018   Atypical chest pain    Regurgitation of food    Laryngopharyngeal reflux (LPR) 07/30/2017   ANA positive 12/30/2016   Gastroesophageal reflux disease 06/27/2016   Disturbance of skin sensation 04/27/2014   Dizziness and giddiness 03/14/2014   Headache, migraine 03/14/2014   Myalgia and myositis, unspecified 06/07/2013   Nausea and vomiting in adult 08/22/2012   Pelvic pain in female 12/18/2010   ANEMIA-IRON DEFICIENCY 10/15/2006   Genital herpes 09/30/2006  Obesity (BMI 35.0-39.9 without comorbidity) 09/30/2006   Nonorganic sleep disorder 09/30/2006   Constipation 09/30/2006   NEURITIS, LUMBOSACRAL NOS 09/30/2006   Personal history of mental disorder 09/30/2006    PCP: [name], FNP   REFERRING PROVIDER: Teryl Lucy, MD  REFERRING DIAG: CERVICALGIA WITH RADICULOPATHY  THERAPY DIAG:  Cervicalgia  Muscle weakness (generalized)  Abnormal posture  Rationale for Evaluation and Treatment: Rehabilitation  ONSET DATE: chronic  SUBJECTIVE:                                                                                                                                                                                                         SUBJECTIVE STATEMENT: Reports minimal improvements to condition.  Will f/u with Dr. Dion Saucier 11/6  PERTINENT HISTORY:  HPI patient is a pleasant 46 year old female who comes in today with continued right shoulder and neck pain.  We have seen her several times for this.  She has  had 2 previous right shoulder surgeries without significant relief of symptoms.  She has had subsequent subacromial and glenohumeral cortisone injections without much relief.  MRI of the cervical spine and shoulder were obtained back in August of this year.  Neck MRI showed minimal cervical spine changes without any disc bulge or spinal canal or neural foraminal narrowing.  Right shoulder MRI showed mild supraspinatus tendinopathy.  No other findings.  She has been seeing Dr. Titus Dubin as she is previously had a positive ANA with sicca symptoms.  She is currently being referred to [facility] rheumatology for further work-up.   Review of Systems as detailed in HPI.  All others reviewed and are negative.  PAIN:  Are you having pain? Yes: NPRS scale: 5/10 Pain location: R shoulder/neck Pain description: ache Aggravating factors: activity, OH reaching Relieving factors: rest  PRECAUTIONS: None  RED FLAGS: None     WEIGHT BEARING RESTRICTIONS: No  FALLS:  Has patient fallen in last 6 months? No  OCCUPATION: bus driver  PLOF: Independent  PATIENT GOALS: To manage my neck pain  NEXT MD VISIT: 11/24  OBJECTIVE:  Note: Objective measures were completed at Evaluation unless otherwise noted.  DIAGNOSTIC FINDINGS:  None available  PATIENT SURVEYS:  FOTO 43(53 predicted)  POSTURE:  elevated R shoulder  PALPATION: TTP R SCM, scalenes, UT and infraspinatus    CERVICAL ROM:   Active ROM A/PROM (deg) eval 03/10/23  Flexion 50% 50%  Extension 50% 50%  Right lateral flexion 90% 100%  Left lateral flexion 25% 75%  Right rotation 75% 90%  Left rotation 50% 75%   (Blank  rows = not tested)  UPPER EXTREMITY ROM:  Active ROM Right eval Left eval 03/10/23  Shoulder flexion 160 170 160d  Shoulder extension     Shoulder abduction 150 170 160d  Shoulder adduction     Shoulder extension     Shoulder internal rotation [facility] WFL   Shoulder external rotation [facility] WFL   Elbow flexion     Elbow  extension     Wrist flexion     Wrist extension     Wrist ulnar deviation     Wrist radial deviation     Wrist pronation     Wrist supination      (Blank rows = not tested)  UPPER EXTREMITY MMT: WFL throughout  MMT Right eval Left eval  Shoulder flexion    Shoulder extension    Shoulder abduction    Shoulder adduction    Shoulder extension    Shoulder internal rotation    Shoulder external rotation    Middle trapezius    Lower trapezius    Elbow flexion    Elbow extension    Wrist flexion    Wrist extension    Wrist ulnar deviation    Wrist radial deviation    Wrist pronation    Wrist supination    Grip strength     (Blank rows = not tested)  CERVICAL SPECIAL TESTS:  Neck flexor muscle endurance test: Positive 15s hold due to weakness; 03/10/23 27s  FUNCTIONAL TESTS:  5 times sit to stand: 12s  TODAY'S TREATMENT:    [facility] Adult PT Treatment:                                                DATE: 03/10/23 Therapeutic Exercise: Nustep L4 6 min Omega row 20# high/low grip 15/15 Omega lat pulldown 20# 15x Scapular wall slides YTB 15x Prone row 15# 2# Prone extension 2# 15x Prone hor abd 2# 15x Prone flexion 2# 15x Prone scaption 2# 15x   [facility] Adult PT Treatment:                                                DATE: 03/06/23 Therapeutic Exercise: Nustep L4 6 min Scaption on wall 15x R UT/scalene stretch 30s x2 ER RTB 15x Hor Abd RTB 15x Supine hor abd RTB 15x B, 15/15 unilaterally Prone extension 1# 15x Prone hor abd 1# 15x Prone flexion 1# 15x  Manual Therapy: R pec minor release 4 way scapula R scalene stretch 30s x3  [facility] Adult PT Treatment:                                                DATE: 03/04/23 Therapeutic Exercise: Nustep L4 6 min Scaption on wall 15x R UT/scalene stretch 30s x2 ER YTB 15x Hor Abd YTB 15x Standing B scaption YTB 15x Scaption on wall 15x Manual Therapy: STM R pec minor and infraspinatus ROM R shoulder R scalene  stretch 30s x3  [facility] Adult PT Treatment:  DATE: 02/25/23 Therapeutic Exercise: UBE level 1 3'/3' fwd/bwd while gathering subjective High/low rows 20# x10 Median nerve glide x10 Rt Seated horizontal abduction RTB x10 Seated BIL ER with scap retraction RTB x10 Seated cervical ext over towel x15 Seated upper trap stretch 2x30" BIL Seated levator scap stretch 2x30" BIL Manual Therapy: STM/MPTR BIL upper traps, cervical paraspinals SO release        [facility] Adult PT Treatment:                                                DATE: 02/19/23 Therapeutic Exercise: UBE level 1 3'/3' fwd/bwd while gathering subjective High/low rows 20# x10 Standing horizontal abduction back against wall RTB x10 Standing diagonals back against wall RTB x10 BIL Seated BIL ER with scap retraction RTB x10 Seated upper trap stretch 2x30" BIL Seated levator scap stretch 2x30" BIL Manual Therapy: STM/MPTR BIL upper traps, cervical paraspinals Positional release BIL upper traps SO release Self Care: Theracane self use instruction                                                                                                                        DATE: 02/04/23 Eval   PATIENT EDUCATION:  Education details: Discussed eval findings, rehab rationale and POC and patient is in agreement  Person educated: Patient Education method: Explanation Education comprehension: verbalized understanding and needs further education  HOME EXERCISE PROGRAM: Access Code: [email or web address] URL: [email or web address] Date: 03/04/2023 Prepared by: Gustavus Bryant  Exercises - Shoulder External Rotation and Scapular Retraction with Resistance  - 2 x daily - 5 x weekly - 2 sets - 15 reps - Standing Shoulder Horizontal Abduction with Resistance  - 2 x daily - 5 x weekly - 2 sets - 15 reps - Scaption Wall Slide with Towel  - 2 x daily - 5 x weekly - 2 sets - 15 reps - Standing  Shoulder Scaption with Resistance  - 2 x daily - 5 x weekly - 2 sets - 15 reps  ASSESSMENT:  CLINICAL IMPRESSION: Patient presents with increased mobility in cervical spine and R shoulder but does not acknowledge any change in symptoms.  Cervical mobility restricted by tight R SCM muscle primarily as well as tight R scalene group due to ongoing compensatory strategies for shoulder girdle weakness and scapular stabilizer strength deficits. Increased weight and resistance as noted adding additional posterior shoulder strengthening tasks.  Still reluctant to dry needle affected muscle groups.  Able to tolerate all resisted tasks with only fatigue reported vs pain   OBJECTIVE IMPAIRMENTS: decreased activity tolerance, decreased knowledge of condition, decreased mobility, decreased ROM, decreased strength, impaired UE functional use, postural dysfunction, and pain.   ACTIVITY LIMITATIONS: carrying, lifting, and reach over head  PERSONAL FACTORS: Fitness, Time since onset of injury/illness/exacerbation, and 1 comorbidity: R RC pathology  are also affecting patient's functional  outcome.   REHAB POTENTIAL: Good  CLINICAL DECISION MAKING: Stable/uncomplicated  EVALUATION COMPLEXITY: Low   GOALS: Goals reviewed with patient? No  SHORT TERM GOALS: Target date: 03/18/2023  Patient to demonstrate independence in HEP  Baseline:  [id number] Goal status: INITIAL  2.  Increase L lateral flexion to 50% Baseline:  Active ROM A/PROM (deg) eval  Flexion 50%  Extension 50%  Right lateral flexion 90%  Left lateral flexion 25%  Right rotation 75%  Left rotation 50%   Goal status: INITIAL   LONG TERM GOALS: Target date: 03/18/2023  Patient will score at least 53% on FOTO to signify clinically meaningful improvement in functional abilities.   Baseline: 43% Goal status: INITIAL  2.  Increase cervical ROM to 75% throughout Baseline:  Active ROM A/PROM (deg) eval 03/10/23  Flexion 50% 50%   Extension 50% 50%  Right lateral flexion 90% 100%  Left lateral flexion 25% 75%  Right rotation 75% 90%  Left rotation 50% 75%   Goal status: Ongoing  3.  Decrease worst pain to 6/10 Baseline: 8/10 Goal status: INITIAL  4.  Decrease tenderness in R UT, SCM, infraspinatus and scalene groups to minimal Baseline: moderate Goal status: INITIAL   PLAN:  PT FREQUENCY: 1-2x/week  PT DURATION: 6 weeks  PLANNED INTERVENTIONS: Therapeutic exercises, Therapeutic activity, Neuromuscular re-education, Balance training, Gait training, Patient/Family education, Self Care, Joint mobilization, Dry Needling, Electrical stimulation, Spinal mobilization, Cryotherapy, Moist heat, Manual therapy, and Re-evaluation  PLAN FOR NEXT SESSION: HEP review and update, manual techniques as appropriate, aerobic tasks, ROM and flexibility activities, strengthening and PREs, TPDN, gait and balance training as needed     Hildred Laser, PT 03/10/2023, 5:44 PM

## 2023-03-10 ENCOUNTER — Ambulatory Visit: Payer: Medicaid Other | Attending: Orthopedic Surgery

## 2023-03-10 DIAGNOSIS — M542 Cervicalgia: Secondary | ICD-10-CM | POA: Insufficient documentation

## 2023-03-10 DIAGNOSIS — M6281 Muscle weakness (generalized): Secondary | ICD-10-CM | POA: Diagnosis present

## 2023-03-10 DIAGNOSIS — R293 Abnormal posture: Secondary | ICD-10-CM | POA: Insufficient documentation

## 2023-03-11 DIAGNOSIS — G501 Atypical facial pain: Secondary | ICD-10-CM | POA: Diagnosis not present

## 2023-03-11 NOTE — Therapy (Unsigned)
OUTPATIENT PHYSICAL THERAPY TREATMENT NOTE   Patient Name: [name] MRN: [id number] DOB:01/13/1976, 47 y.o., female Today's Date: 03/12/2023  END OF SESSION:  PT End of Session - 03/12/23 1745     Visit Number 6    Number of Visits 12    Date for PT Re-Evaluation 04/06/23    Authorization Type MCD    Authorization Time Period 7 visits approved 02/17/23-04/17/23    Authorization - Number of Visits 7    PT Start Time 1745    PT Stop Time 1825    PT Time Calculation (min) 40 min    Activity Tolerance Patient tolerated treatment well;Patient limited by pain    Behavior During Therapy [facility] for tasks assessed/performed                   Past Medical History:  Diagnosis Date   Allergy    Anemia    Anxiety    Apnea 12/08/2019   Arthritis    Asthma    Back pain    Constipation    Exertional dyspnea 03/14/2020   Fatty liver    GERD (gastroesophageal reflux disease)    H/O: hysterectomy    Hx of endometriosis    Inappropriate sinus tachycardia (HCC) 12/08/2019   Insomnia    Joint pain    Lactose intolerance    Migraine    Morbid obesity (HCC) 12/08/2019   Osteoporosis    Pneumonia    Pre-diabetes    Urticaria    Vitamin D deficiency    Past Surgical History:  Procedure Laterality Date   4 HOUR PH STUDY N/A 01/12/2018   Procedure: 24 HOUR PH STUDY;  Surgeon: Napoleon Form, MD;  Location: [facility];  Service: Endoscopy;  Laterality: N/A;   ABDOMINAL HYSTERECTOMY  12/12/2003   ADENOIDECTOMY  05/2011   ANKLE ARTHROSCOPY  12/29/2007   right; with extensive debridement   BLADDER NECK RECONSTRUCTION  01/14/2011   Procedure: BLADDER NECK REPAIR;  Surgeon: Reva Bores, MD;  Location: [facility];  Service: Gynecology;  Laterality: N/A;  Laparoscopic Repair of Incidental Cystotomy   BREAST REDUCTION SURGERY  08/05/2011   Procedure: MAMMARY REDUCTION  (BREAST);  Surgeon: Louisa Second, MD;  Location: [facility];  Service: Plastics;   Laterality: Bilateral;  bilateral   CESAREAN SECTION  12/29/2000; 1994   CHOLECYSTECTOMY     age 70   DILATION AND CURETTAGE OF UTERUS  07/08/2003   open laparoscopy   ESOPHAGEAL MANOMETRY N/A 01/12/2018   Procedure: ESOPHAGEAL MANOMETRY (EM);  Surgeon: Napoleon Form, MD;  Location: [facility];  Service: Endoscopy;  Laterality: N/A;   GASTRIC ROUX-EN-Y N/A 03/19/2022   Procedure: LAPAROSCOPIC ROUX-EN-Y GASTRIC BYPASS WITH UPPER ENDOSCOPY;  Surgeon: Gaynelle Adu, MD;  Location: [facility];  Service: General;  Laterality: N/A;   GIVENS CAPSULE STUDY N/A 06/23/2017   Procedure: GIVENS CAPSULE STUDY;  Surgeon: Benancio Deeds, MD;  Location: [facility];  Service: Gastroenterology;  Laterality: N/A;   HIATAL HERNIA REPAIR N/A 03/19/2022   Procedure: HERNIA REPAIR HIATAL;  Surgeon: Gaynelle Adu, MD;  Location: [facility];  Service: General;  Laterality: N/A;   LAPAROSCOPIC SALPINGOOPHERECTOMY  01/14/2011   left   LAPAROSCOPY N/A 11/01/2022   Procedure: LAPAROSCOPY DIAGNOSTIC; LAPARSCOPIC CLOSURE OF PETERSEN SPACE;  Surgeon: Gaynelle Adu, MD;  Location: [facility];  Service: General;  Laterality: N/A;  DR Gerrit Friends TO ASSIST   PH IMPEDANCE STUDY N/A 01/12/2018   Procedure: PH IMPEDANCE STUDY;  Surgeon: Lavon Paganini,  Eleonore Chiquito, MD;  Location: [facility];  Service: Endoscopy;  Laterality: N/A;   RESECTION DISTAL CLAVICAL Right 09/25/2017   Procedure: RESECTION DISTAL CLAVICAL;  Surgeon: Bjorn Pippin, MD;  Location: [facility];  Service: Orthopedics;  Laterality: Right;   RIGHT OOPHORECTOMY  2011   with lysis of adhesions   SHOULDER ACROMIOPLASTY Right 09/25/2017   Procedure: SHOULDER ACROMIOPLASTY;  Surgeon: Bjorn Pippin, MD;  Location: [facility];  Service: Orthopedics;  Laterality: Right;   SHOULDER ARTHROSCOPY WITH DEBRIDEMENT AND BICEP TENDON REPAIR Right 09/25/2017   Procedure: SHOULDER ARTHROSCOPY WITH DEBRIDEMENT AND BICEP TENDON REPAIR;  Surgeon: Bjorn Pippin,  MD;  Location: [facility];  Service: Orthopedics;  Laterality: Right;   SHOULDER ARTHROSCOPY WITH ROTATOR CUFF REPAIR Right 09/25/2017   Procedure: SHOULDER ARTHROSCOPY WITH ROTATOR CUFF REPAIR;  Surgeon: Bjorn Pippin, MD;  Location:  SURGERY CENTER;  Service: Orthopedics;  Laterality: Right;   TONSILLECTOMY  as a child   TUBAL LIGATION  12/29/2000   UPPER GASTROINTESTINAL ENDOSCOPY     UPPER GI ENDOSCOPY N/A 11/01/2022   Procedure: UPPER GI ENDOSCOPY;  Surgeon: Gaynelle Adu, MD;  Location: [facility];  Service: General;  Laterality: N/A;   Patient Active Problem List   Diagnosis Date Noted   Class 1 obesity due to excess calories with serious comorbidity and body mass index (BMI) of 33.0 to 33.9 in adult 11/05/2022   COVID-19 vaccination declined 11/05/2022   Abdominal pain 08/21/2022   Anemia of pregnancy 08/21/2022   Chronic pelvic pain in female 08/21/2022   History of cesarean section 08/21/2022   Irregular intermenstrual bleeding 08/21/2022   S/P gastric bypass 03/19/2022   Microcytic anemia 12/14/2021   Iron deficiency anemia 12/14/2021   Adverse food reaction 03/21/2021   Depressive disorder 01/10/2021   Osteoporosis 01/10/2021   Thyroid nodule 01/10/2021   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   Ascites 10/05/2020   Muscle tension dysphonia 08/10/2020   Other insomnia 07/27/2020   At risk for side effect of medication 07/27/2020   Other hyperlipidemia 05/15/2020   At risk for osteoporosis 05/15/2020   At risk for impaired metabolic function 04/04/2020   Other fatigue 03/22/2020   SOBOE (shortness of breath on exertion) 03/22/2020   Exertional dyspnea 03/14/2020   Cyst of ovary 03/11/2020   Endometriosis 03/11/2020   Herpes simplex type 2 infection 03/11/2020   Neuropathy, right side of face 03/08/2020   At risk for depression 03/08/2020   Primary osteoarthritis of both feet 02/26/2020   Hyperlipidemia 01/26/2020   At risk for heart disease 01/26/2020    Daytime somnolence 01/26/2020   Abnormal glucose 01/26/2020   Vitamin D deficiency 01/04/2020   Fatty liver 01/04/2020   B12 deficiency 01/04/2020   Moderate persistent asthma without complication 12/31/2019   Inappropriate sinus tachycardia (HCC) 12/08/2019   Apnea 12/08/2019   Anaphylactic reaction due to food, subsequent encounter 11/22/2019   Idiopathic urticaria 11/22/2019   Generalized anxiety disorder 04/08/2018   Atypical chest pain    Regurgitation of food    Laryngopharyngeal reflux (LPR) 07/30/2017   ANA positive 12/30/2016   Gastroesophageal reflux disease 06/27/2016   Disturbance of skin sensation 04/27/2014   Dizziness and giddiness 03/14/2014   Headache, migraine 03/14/2014   Myalgia and myositis, unspecified 06/07/2013   Nausea and vomiting in adult 08/22/2012   Pelvic pain in female 12/18/2010   ANEMIA-IRON DEFICIENCY 10/15/2006   Genital herpes 09/30/2006   Obesity (BMI 35.0-39.9 without comorbidity) 09/30/2006  Nonorganic sleep disorder 09/30/2006   Constipation 09/30/2006   NEURITIS, LUMBOSACRAL NOS 09/30/2006   Personal history of mental disorder 09/30/2006    PCP: [name], FNP   REFERRING PROVIDER: Teryl Lucy, MD  REFERRING DIAG: CERVICALGIA WITH RADICULOPATHY  THERAPY DIAG:  Cervicalgia  Muscle weakness (generalized)  Abnormal posture  Rationale for Evaluation and Treatment: Rehabilitation  ONSET DATE: chronic  SUBJECTIVE:                                                                                                                                                                                                         SUBJECTIVE STATEMENT: Saw MD and was given Toradol injections.  Informed she would always have shoulder issues.  PERTINENT HISTORY:  HPI patient is a pleasant 47 year old female who comes in today with continued right shoulder and neck pain.  We have seen her several times for this.  She has had 2 previous right  shoulder surgeries without significant relief of symptoms.  She has had subsequent subacromial and glenohumeral cortisone injections without much relief.  MRI of the cervical spine and shoulder were obtained back in August of this year.  Neck MRI showed minimal cervical spine changes without any disc bulge or spinal canal or neural foraminal narrowing.  Right shoulder MRI showed mild supraspinatus tendinopathy.  No other findings.  She has been seeing Dr. Titus Dubin as she is previously had a positive ANA with sicca symptoms.  She is currently being referred to [facility] rheumatology for further work-up.   Review of Systems as detailed in HPI.  All others reviewed and are negative.  PAIN:  Are you having pain? Yes: NPRS scale: 5/10 Pain location: R shoulder/neck Pain description: ache Aggravating factors: activity, OH reaching Relieving factors: rest  PRECAUTIONS: None  RED FLAGS: None     WEIGHT BEARING RESTRICTIONS: No  FALLS:  Has patient fallen in last 6 months? No  OCCUPATION: bus driver  PLOF: Independent  PATIENT GOALS: To manage my neck pain  NEXT MD VISIT: 11/24  OBJECTIVE:  Note: Objective measures were completed at Evaluation unless otherwise noted.  DIAGNOSTIC FINDINGS:  None available  PATIENT SURVEYS:  FOTO 43(53 predicted)  POSTURE:  elevated R shoulder  PALPATION: TTP R SCM, scalenes, UT and infraspinatus    CERVICAL ROM:   Active ROM A/PROM (deg) eval 03/10/23  Flexion 50% 50%  Extension 50% 50%  Right lateral flexion 90% 100%  Left lateral flexion 25% 75%  Right rotation 75% 90%  Left rotation 50% 75%   (Blank rows = not tested)  UPPER EXTREMITY ROM:  Active ROM Right eval Left eval 03/10/23  Shoulder flexion 160 170 160d  Shoulder extension     Shoulder abduction 150 170 160d  Shoulder adduction     Shoulder extension     Shoulder internal rotation [facility] WFL   Shoulder external rotation [facility] WFL   Elbow flexion     Elbow extension     Wrist  flexion     Wrist extension     Wrist ulnar deviation     Wrist radial deviation     Wrist pronation     Wrist supination      (Blank rows = not tested)  UPPER EXTREMITY MMT: WFL throughout  MMT Right eval Left eval  Shoulder flexion    Shoulder extension    Shoulder abduction    Shoulder adduction    Shoulder extension    Shoulder internal rotation    Shoulder external rotation    Middle trapezius    Lower trapezius    Elbow flexion    Elbow extension    Wrist flexion    Wrist extension    Wrist ulnar deviation    Wrist radial deviation    Wrist pronation    Wrist supination    Grip strength     (Blank rows = not tested)  CERVICAL SPECIAL TESTS:  Neck flexor muscle endurance test: Positive 15s hold due to weakness; 03/10/23 27s  FUNCTIONAL TESTS:  5 times sit to stand: 12s  TODAY'S TREATMENT:    [facility] Adult PT Treatment:                                                DATE: 03/12/23 Therapeutic Exercise: Nustep L4 6 min Omega row 25# high/low grip 15/15 Omega lat pulldown 25# 15x Scapular wall slides RTB 15x Prone row 15# 2# Prone extension 2# 15x Prone hor abd 2# 15x Prone flexion 2# 15x Prone scaption 2# 15x Joint mobs inf/post/dist 5x10 Rhythmic stabilization 60/90/120 60s hold PNF D1 F/E  [facility] Adult PT Treatment:                                                DATE: 03/10/23 Therapeutic Exercise: Nustep L4 6 min Omega row 20# high/low grip 15/15 Omega lat pulldown 20# 15x Scapular wall slides YTB 15x Prone row 15# 2# Prone extension 2# 15x Prone hor abd 2# 15x Prone flexion 2# 15x Prone scaption 2# 15x   [facility] Adult PT Treatment:                                                DATE: 03/06/23 Therapeutic Exercise: Nustep L4 6 min Scaption on wall 15x R UT/scalene stretch 30s x2 ER RTB 15x Hor Abd RTB 15x Supine hor abd RTB 15x B, 15/15 unilaterally Prone extension 1# 15x Prone hor abd 1# 15x Prone flexion 1# 15x  Manual Therapy: R pec minor  release 4 way scapula R scalene stretch 30s x3  [facility] Adult PT Treatment:  DATE: 03/04/23 Therapeutic Exercise: Nustep L4 6 min Scaption on wall 15x R UT/scalene stretch 30s x2 ER YTB 15x Hor Abd YTB 15x Standing B scaption YTB 15x Scaption on wall 15x Manual Therapy: STM R pec minor and infraspinatus ROM R shoulder R scalene stretch 30s x3  [facility] Adult PT Treatment:                                                DATE: 02/25/23 Therapeutic Exercise: UBE level 1 3'/3' fwd/bwd while gathering subjective High/low rows 20# x10 Median nerve glide x10 Rt Seated horizontal abduction RTB x10 Seated BIL ER with scap retraction RTB x10 Seated cervical ext over towel x15 Seated upper trap stretch 2x30" BIL Seated levator scap stretch 2x30" BIL Manual Therapy: STM/MPTR BIL upper traps, cervical paraspinals SO release        [facility] Adult PT Treatment:                                                DATE: 02/19/23 Therapeutic Exercise: UBE level 1 3'/3' fwd/bwd while gathering subjective High/low rows 20# x10 Standing horizontal abduction back against wall RTB x10 Standing diagonals back against wall RTB x10 BIL Seated BIL ER with scap retraction RTB x10 Seated upper trap stretch 2x30" BIL Seated levator scap stretch 2x30" BIL Manual Therapy: STM/MPTR BIL upper traps, cervical paraspinals Positional release BIL upper traps SO release Self Care: Theracane self use instruction                                                                                                                        DATE: 02/04/23 Eval   PATIENT EDUCATION:  Education details: Discussed eval findings, rehab rationale and POC and patient is in agreement  Person educated: Patient Education method: Explanation Education comprehension: verbalized understanding and needs further education  HOME EXERCISE PROGRAM: Access Code: [email or web address] URL:  [email or web address] Date: 03/04/2023 Prepared by: Gustavus Bryant  Exercises - Shoulder External Rotation and Scapular Retraction with Resistance  - 2 x daily - 5 x weekly - 2 sets - 15 reps - Standing Shoulder Horizontal Abduction with Resistance  - 2 x daily - 5 x weekly - 2 sets - 15 reps - Scaption Wall Slide with Towel  - 2 x daily - 5 x weekly - 2 sets - 15 reps - Standing Shoulder Scaption with Resistance  - 2 x daily - 5 x weekly - 2 sets - 15 reps  ASSESSMENT:  CLINICAL IMPRESSION: Increased resistance on shoulder tasks emphasizing postural muscles to restore R shoulder proper mechanics.  Incorporated additional manual tasks to facilitate function and mobility.   OBJECTIVE IMPAIRMENTS: decreased activity tolerance, decreased knowledge of condition, decreased mobility, decreased ROM,  decreased strength, impaired UE functional use, postural dysfunction, and pain.   ACTIVITY LIMITATIONS: carrying, lifting, and reach over head  PERSONAL FACTORS: Fitness, Time since onset of injury/illness/exacerbation, and 1 comorbidity: R RC pathology  are also affecting patient's functional outcome.   REHAB POTENTIAL: Good  CLINICAL DECISION MAKING: Stable/uncomplicated  EVALUATION COMPLEXITY: Low   GOALS: Goals reviewed with patient? No  SHORT TERM GOALS: Target date: 03/18/2023  Patient to demonstrate independence in HEP  Baseline:  [id number] Goal status: Met  2.  Increase L lateral flexion to 50% Baseline:  Active ROM A/PROM (deg) eval  Flexion 50%  Extension 50%  Right lateral flexion 90%  Left lateral flexion 25%  Right rotation 75%  Left rotation 50%   Goal status: INITIAL   LONG TERM GOALS: Target date: 03/18/2023  Patient will score at least 53% on FOTO to signify clinically meaningful improvement in functional abilities.   Baseline: 43% Goal status: INITIAL  2.  Increase cervical ROM to 75% throughout Baseline:  Active ROM A/PROM (deg) eval  03/10/23  Flexion 50% 50%  Extension 50% 50%  Right lateral flexion 90% 100%  Left lateral flexion 25% 75%  Right rotation 75% 90%  Left rotation 50% 75%   Goal status: Ongoing  3.  Decrease worst pain to 6/10 Baseline: 8/10 Goal status: INITIAL  4.  Decrease tenderness in R UT, SCM, infraspinatus and scalene groups to minimal Baseline: moderate Goal status: INITIAL   PLAN:  PT FREQUENCY: 1-2x/week  PT DURATION: 6 weeks  PLANNED INTERVENTIONS: Therapeutic exercises, Therapeutic activity, Neuromuscular re-education, Balance training, Gait training, Patient/Family education, Self Care, Joint mobilization, Dry Needling, Electrical stimulation, Spinal mobilization, Cryotherapy, Moist heat, Manual therapy, and Re-evaluation  PLAN FOR NEXT SESSION: HEP review and update, manual techniques as appropriate, aerobic tasks, ROM and flexibility activities, strengthening and PREs, TPDN, gait and balance training as needed     Hildred Laser, PT 03/12/2023, 6:24 PM

## 2023-03-12 ENCOUNTER — Ambulatory Visit: Payer: Medicaid Other

## 2023-03-12 DIAGNOSIS — R293 Abnormal posture: Secondary | ICD-10-CM

## 2023-03-12 DIAGNOSIS — M6281 Muscle weakness (generalized): Secondary | ICD-10-CM

## 2023-03-12 DIAGNOSIS — M542 Cervicalgia: Secondary | ICD-10-CM

## 2023-03-12 DIAGNOSIS — M25511 Pain in right shoulder: Secondary | ICD-10-CM | POA: Diagnosis not present

## 2023-03-14 ENCOUNTER — Ambulatory Visit: Payer: Medicaid Other | Admitting: Gastroenterology

## 2023-03-14 ENCOUNTER — Encounter: Payer: Self-pay | Admitting: Gastroenterology

## 2023-03-14 VITALS — BP 116/68 | HR 58 | Ht 64.0 in | Wt 192.0 lb

## 2023-03-14 DIAGNOSIS — Z79899 Other long term (current) drug therapy: Secondary | ICD-10-CM

## 2023-03-14 DIAGNOSIS — K219 Gastro-esophageal reflux disease without esophagitis: Secondary | ICD-10-CM | POA: Diagnosis not present

## 2023-03-14 DIAGNOSIS — Z9884 Bariatric surgery status: Secondary | ICD-10-CM | POA: Diagnosis not present

## 2023-03-14 MED ORDER — ESOMEPRAZOLE MAGNESIUM 40 MG PO CPDR: 40.0000 mg | DELAYED_RELEASE_CAPSULE | Freq: Every day | ORAL | 3 refills | Status: AC

## 2023-03-14 NOTE — Patient Instructions (Signed)
We have sent the following medications to your pharmacy for you to pick up at your convenience: Nexium  Thank you for entrusting me with your care and for choosing [facility], Dr. Ileene Patrick    If your blood pressure at your visit was 140/90 or greater, please contact your primary care physician to follow up on this. ______________________________________________________  If you are age 47 or older, your body mass index should be between 23-30. Your Body mass index is 32.96 kg/m. If this is out of the aforementioned range listed, please consider follow up with your Primary Care Provider.  If you are age 57 or younger, your body mass index should be between 19-25. Your Body mass index is 32.96 kg/m. If this is out of the aformentioned range listed, please consider follow up with your Primary Care Provider.  ________________________________________________________  The [place] [facility] providers would like to encourage you to use [facility] to communicate with providers for non-urgent requests or questions.  Due to long hold times on the telephone, sending your provider a message by [facility] may be a faster and more efficient way to get a response.  Please allow 48 business hours for a response.  Please remember that this is for non-urgent requests.  _______________________________________________________  Due to recent changes in healthcare laws, you may see the results of your imaging and laboratory studies on MyChart before your provider has had a chance to review them.  We understand that in some cases there may be results that are confusing or concerning to you. Not all laboratory results come back in the same time frame and the provider may be waiting for multiple results in order to interpret others.  Please give [place] 48 hours in order for your provider to thoroughly review all the results before contacting the office for clarification of your results.

## 2023-03-14 NOTE — Progress Notes (Signed)
HPI :  47 year old female here for follow-up visit.  Recall she was previously seen for chronic epigastric pain, GERD, early satiety, nausea, dysphagia.  Last saw her March 2023.  She has had a pretty extensive evaluation as outlined below.  At her last visit she was still having problems with her stomach.  We had added FDgard and Remeron to her regimen.  She did not think that the FDgard helped too much although cannot really remember how much she tried it.  She cannot recall how Remeron affected her but she stopped it.  Ultimately since have seen her she underwent gastric bypass surgery with Dr. Andrey Campanile this past November.  She has been doing really well since then.  She is lost upwards of 50 pounds or so.  Since having this done she states her stomachs been feeling a lot better.  She does not really have much nausea or dyspepsia anymore.  She does have history of reflux and has been maintained on Nexium 40 mg daily.  She states this was working really well for her and she really does not have much of any breakthrough.  A lot of the symptoms she previously was dealing with have improved since her bypass surgery and she is quite pleased with her course so far.  She is not having any problems with the eating currently, denies any complaints in general.   Prior workup: Capsule endoscopy - 06/23/17 - complete study with good prep, no clear cause for iron deficiency EGD 02/20/2017 - 2cm HH, otherwise normal exam, biopsies negative for HP Colonoscopy 02/20/2017 - focal mild erythema in the rectum - biopsied showed no evidence of colitis, otherwise normal colon and ileum  MRI liver / abdomen - 02/10/2017 - focal fatty infiltration, normal biliary tree, otherwise no pathology noted in the abdomen   Historic evaluation: CT scan neck 12/02/16 - normal CT angio of chest 12/02/2016 - normal EGD 08/19/2013 - Dr. Alycia Rossetti - normal esophagus, mild gastritis Esophageal manometry 2015 - normal PH study 2015 - normal  exam, Demeester score of 5.7, positive symptom index for regurgitation and reflux EGG normal 2015 Normal hydrogen breath test 2015 Normal gastric emptying scan 2015 GES - delayed in 2011 - Reglan caused twitching and it was stopped.    GES 11/13/2017 - normal   Manometry / 24 hr pH impedance testing was recommended   Esophageal manometry 01/12/18 - normal PH impedance study 01/12/18 - Demeester score 9, SAP positively correlates with reflux, good acid suppression on PPI   Barium swallow 09/02/19 - IMPRESSION: Mild intermittent esophageal dysmotility. Small sliding hiatal hernia. Otherwise unremarkable esophagram as described.     01/05/2020 EGD done for dysphagia.  She had a 3 cm hiatal hernia otherwise normal.     EGD 12/19/20 -  - A 2 cm hiatal hernia was present. - The exam of the esophagus was otherwise normal. - Patchy erythematous mucosa was found in the gastric body without focal ulceration. - The exam of the stomach was otherwise normal. - Biopsies were taken with a cold forceps in the gastric body, at the incisura and in the gastric antrum for Helicobacter pylori testing. - The duodenal bulb and second portion of the duodenum were normal.   Surgical [P], gastric antrum and gastric body - ANTRAL AND OXYNTIC MUCOSA WITH SLIGHT CHRONIC INFLAMMATION. [place] NEGATIVE FOR HELICOBACTER PYLORI. - NO INTESTINAL METAPLASIA, DYSPLASIA OR CARCINOMA.    CT abdomen / pelvis 10/04/22: IMPRESSION: 1. Mild diffuse bladder wall thickening, can be  seen with cystitis. 2. Gastric bypass anatomy with small hiatal hernia. Enteric contrast in the distal esophagus may be reflux or delayed transit. 3. Moderate volume of colonic stool, can be seen with constipation.    Past Medical History:  Diagnosis Date   Allergy    Anemia    Anxiety    Apnea 12/08/2019   Arthritis    Asthma    Back pain    Constipation    Exertional dyspnea 03/14/2020   Fatty liver    GERD  (gastroesophageal reflux disease)    H/O: hysterectomy    Hx of endometriosis    Inappropriate sinus tachycardia (HCC) 12/08/2019   Insomnia    Joint pain    Lactose intolerance    Migraine    Morbid obesity (HCC) 12/08/2019   Osteoporosis    Pneumonia    Pre-diabetes    Urticaria    Vitamin D deficiency      Past Surgical History:  Procedure Laterality Date   68 HOUR PH STUDY N/A 01/12/2018   Procedure: 24 HOUR PH STUDY;  Surgeon: Napoleon Form, MD;  Location: [facility];  Service: Endoscopy;  Laterality: N/A;   ABDOMINAL HYSTERECTOMY  12/12/2003   ADENOIDECTOMY  05/2011   ANKLE ARTHROSCOPY  12/29/2007   right; with extensive debridement   BLADDER NECK RECONSTRUCTION  01/14/2011   Procedure: BLADDER NECK REPAIR;  Surgeon: Reva Bores, MD;  Location: [facility];  Service: Gynecology;  Laterality: N/A;  Laparoscopic Repair of Incidental Cystotomy   BREAST REDUCTION SURGERY  08/05/2011   Procedure: MAMMARY REDUCTION  (BREAST);  Surgeon: Louisa Second, MD;  Location: [facility];  Service: Plastics;  Laterality: Bilateral;  bilateral   CESAREAN SECTION  12/29/2000; 1994   CHOLECYSTECTOMY     age 59   DILATION AND CURETTAGE OF UTERUS  07/08/2003   open laparoscopy   ESOPHAGEAL MANOMETRY N/A 01/12/2018   Procedure: ESOPHAGEAL MANOMETRY (EM);  Surgeon: Napoleon Form, MD;  Location: [facility];  Service: Endoscopy;  Laterality: N/A;   GASTRIC ROUX-EN-Y N/A 03/19/2022   Procedure: LAPAROSCOPIC ROUX-EN-Y GASTRIC BYPASS WITH UPPER ENDOSCOPY;  Surgeon: Gaynelle Adu, MD;  Location: [facility];  Service: General;  Laterality: N/A;   GIVENS CAPSULE STUDY N/A 06/23/2017   Procedure: GIVENS CAPSULE STUDY;  Surgeon: Benancio Deeds, MD;  Location: [facility];  Service: Gastroenterology;  Laterality: N/A;   HIATAL HERNIA REPAIR N/A 03/19/2022   Procedure: HERNIA REPAIR HIATAL;  Surgeon: Gaynelle Adu, MD;  Location: [facility];  Service: General;  Laterality: N/A;    LAPAROSCOPIC SALPINGOOPHERECTOMY  01/14/2011   left   LAPAROSCOPY N/A 11/01/2022   Procedure: LAPAROSCOPY DIAGNOSTIC; LAPARSCOPIC CLOSURE OF PETERSEN SPACE;  Surgeon: Gaynelle Adu, MD;  Location: [facility];  Service: General;  Laterality: N/A;  DR Gerrit Friends TO ASSIST   PH IMPEDANCE STUDY N/A 01/12/2018   Procedure: PH IMPEDANCE STUDY;  Surgeon: Napoleon Form, MD;  Location: [facility];  Service: Endoscopy;  Laterality: N/A;   RESECTION DISTAL CLAVICAL Right 09/25/2017   Procedure: RESECTION DISTAL CLAVICAL;  Surgeon: Bjorn Pippin, MD;  Location: [facility];  Service: Orthopedics;  Laterality: Right;   RIGHT OOPHORECTOMY  2011   with lysis of adhesions   SHOULDER ACROMIOPLASTY Right 09/25/2017   Procedure: SHOULDER ACROMIOPLASTY;  Surgeon: Bjorn Pippin, MD;  Location: [facility];  Service: Orthopedics;  Laterality: Right;   SHOULDER ARTHROSCOPY WITH DEBRIDEMENT AND BICEP TENDON REPAIR Right 09/25/2017   Procedure: SHOULDER ARTHROSCOPY WITH  DEBRIDEMENT AND BICEP TENDON REPAIR;  Surgeon: Bjorn Pippin, MD;  Location: [facility];  Service: Orthopedics;  Laterality: Right;   SHOULDER ARTHROSCOPY WITH ROTATOR CUFF REPAIR Right 09/25/2017   Procedure: SHOULDER ARTHROSCOPY WITH ROTATOR CUFF REPAIR;  Surgeon: Bjorn Pippin, MD;  Location: [facility];  Service: Orthopedics;  Laterality: Right;   TONSILLECTOMY  as a child   TUBAL LIGATION  12/29/2000   UPPER GASTROINTESTINAL ENDOSCOPY     UPPER GI ENDOSCOPY N/A 11/01/2022   Procedure: UPPER GI ENDOSCOPY;  Surgeon: Gaynelle Adu, MD;  Location: [facility];  Service: General;  Laterality: N/A;   Family History  Problem Relation Age of Onset   Diabetes Mother    Hypertension Mother    Hyperlipidemia Mother    Sleep apnea Mother    Thyroid disease Sister    Dementia Father    Heart attack Maternal Grandmother    Stroke Maternal Grandfather    Asthma Son    Healthy Son    Asthma Daughter     Healthy Daughter    Colon cancer Neg Hx    Colon polyps Neg Hx    Esophageal cancer Neg Hx    Rectal cancer Neg Hx    Stomach cancer Neg Hx    Social History   Tobacco Use   Smoking status: Never   Smokeless tobacco: Never  Vaping Use   Vaping status: Never Used  Substance Use Topics   Alcohol use: Not Currently    Comment: occasional    Drug use: No   Current Outpatient Medications  Medication Sig Dispense Refill   acetaminophen (TYLENOL) 325 MG tablet Take 2 tablets (650 mg total) by mouth every 6 (six) hours as needed. 30 tablet 0   albuterol (VENTOLIN HFA) 108 (90 Base) MCG/ACT inhaler Inhale 1-2 puffs into the lungs every 6 (six) hours as needed for wheezing or shortness of breath. 1 each 0   calcium carbonate (OS-CAL - DOSED IN MG OF ELEMENTAL CALCIUM) 1250 (500 Ca) MG tablet Take 1 tablet by mouth daily with breakfast.     clonazePAM (KLONOPIN) 1 MG tablet Take 0.5-1 mg by mouth at bedtime.     diclofenac (VOLTAREN) 75 MG EC tablet Take 75 mg by mouth 2 (two) times daily. (Patient not taking: Reported on 02/23/2023)     EPINEPHrine (EPIPEN 2-PAK) 0.3 mg/0.3 mL IJ SOAJ injection Inject 0.3 mg into the muscle as needed for anaphylaxis. 2 each 2   ergocalciferol (VITAMIN D2) 1.25 MG (50000 UT) capsule Take 50,000 Units by mouth once a week.     esomeprazole (NEXIUM) 40 MG capsule TAKE 1 CAPSULE BY MOUTH TWICE A DAY BEFORE A MEAL. PLEASE SCHEDULE A FOLLOW-UP APPOINTMENT FOR FURTHER REFILLS (Patient not taking: Reported on 02/23/2023) 60 capsule 0   esomeprazole (NEXIUM) 40 MG capsule Take 40 mg by mouth daily at 12 noon.     estradiol (CLIMARA - DOSED IN MG/24 HR) 0.1 mg/24hr patch Place 0.1 mg onto the skin once a week.     HYDROcodone-acetaminophen (NORCO) 7.5-325 MG tablet Take 1 tablet by mouth every 8 (eight) hours as needed for moderate pain. (Patient not taking: Reported on 01/21/2023)     Multiple Vitamins-Minerals (BARIATRIC MULTIVITAMINS/IRON PO) Take 1 capsule by mouth  in the morning and at bedtime.     ondansetron (ZOFRAN-ODT) 4 MG disintegrating tablet TAKE 1 TABLET BY MOUTH EVERY 8 HOURS AS NEEDED FOR NAUSEA FOR UP TO 7 DAYS     RESTASIS  0.05 % ophthalmic emulsion Place 1 drop into both eyes 2 (two) times daily as needed (dryness).     No current facility-administered medications for this visit.   Allergies  Allergen Reactions   Aspirin Other (See Comments)    MD advised pt to not take med   Bactrim [Sulfamethoxazole-Trimethoprim]    Doxycycline    Ibuprofen Other (See Comments)    MD advised pt to not take med    Rosanne Ashing Allergy] Swelling   Zoledronic Acid Other (See Comments)    Muscle aches     Review of Systems: All systems reviewed and negative except where noted in HPI.     Lab Results  Component Value Date   NA 138 11/04/2022   CL 104 11/04/2022   K 4.1 11/04/2022   CO2 27 11/04/2022   BUN 10 11/04/2022   CREATININE 0.68 11/04/2022   GFRNONAA >60 11/04/2022   CALCIUM 9.1 11/04/2022   PHOS 3.0 10/08/2020   ALBUMIN 3.9 10/21/2022   GLUCOSE 95 11/04/2022    Lab Results  Component Value Date   ALT 39 10/21/2022   AST 27 10/21/2022   ALKPHOS 53 10/21/2022   BILITOT 0.4 10/21/2022     Physical Exam: BP 116/68   Pulse (!) 58   Ht 5\' 4"  (1.626 m)   Wt 192 lb (87.1 kg)   LMP 12/27/2003   BMI 32.96 kg/m  Constitutional: Pleasant,well-developed, female in no acute distress. Neurological: Alert and oriented to person place and time. Psychiatric: Normal mood and affect. Behavior is normal.   ASSESSMENT: 47 y.o. female here for assessment of the following  1. Gastroesophageal reflux disease, unspecified whether esophagitis present   2. Long-term current use of proton pump inhibitor therapy   3. History of gastric bypass    She previously had numerous upper tract complaints including GERD, early satiety, nausea, dyspepsia.  Since I have last seen her she underwent gastric bypass and has lost a considerable  amount of weight.  A lot of the symptoms have since improved and she is doing really well.  Still has some baseline reflux but controlled on Nexium 40 mg daily.  She denies any problems otherwise with the eating and is happy with her weight loss.  We discussed long-term plan from here.  Long-term want to use lowest dose of PPI needed to control symptoms.  We did discuss long-term risks associated with chronic PPI use.  She is currently taking 40 mg daily, I offered to bring her down to 20 mg daily to see how she does or take 40 mg every other day.  She is a bit hesitant to reduce the dose given she is feeling so well right now but can consider doing this over time.  Otherwise continue to work on her weight loss status post gastric bypass, she is doing really well.   PLAN: - continue nexium for now, refilled- counseled on risks / benefits, she wishes to continue. Discussed trial of taper she declines that for now.  Long-term I recommend using the lowest dose needed to control symptoms. - doing really well post gastric bypass - her early satiety and nausea has mostly resolved - follow up one year or sooner with issues  Harlin Rain, MD [facility]

## 2023-03-17 NOTE — Therapy (Unsigned)
OUTPATIENT PHYSICAL THERAPY TREATMENT NOTE   Patient Name: [name] MRN: [id number] DOB:April 07, 1976, 47 y.o., female Today's Date: 03/19/2023  END OF SESSION:  PT End of Session - 03/18/23 1620     Visit Number 7    Number of Visits 12    Date for PT Re-Evaluation 04/06/23    Authorization Type MCD    Authorization Time Period 7 visits approved 02/17/23-04/17/23    Authorization - Number of Visits 7    PT Start Time 1615    PT Stop Time 1700    PT Time Calculation (min) 45 min    Activity Tolerance Patient tolerated treatment well;Patient limited by pain    Behavior During Therapy [facility] for tasks assessed/performed                    Past Medical History:  Diagnosis Date   Allergy    Anemia    Anxiety    Apnea 12/08/2019   Arthritis    Asthma    Back pain    Constipation    Exertional dyspnea 03/14/2020   Fatty liver    GERD (gastroesophageal reflux disease)    H/O: hysterectomy    Hx of endometriosis    Inappropriate sinus tachycardia (HCC) 12/08/2019   Insomnia    Joint pain    Lactose intolerance    Migraine    Morbid obesity (HCC) 12/08/2019   Osteoporosis    Pneumonia    Pre-diabetes    Urticaria    Vitamin D deficiency    Past Surgical History:  Procedure Laterality Date   30 HOUR PH STUDY N/A 01/12/2018   Procedure: 24 HOUR PH STUDY;  Surgeon: Napoleon Form, MD;  Location: [facility];  Service: Endoscopy;  Laterality: N/A;   ABDOMINAL HYSTERECTOMY  12/12/2003   ADENOIDECTOMY  05/2011   ANKLE ARTHROSCOPY  12/29/2007   right; with extensive debridement   BLADDER NECK RECONSTRUCTION  01/14/2011   Procedure: BLADDER NECK REPAIR;  Surgeon: Reva Bores, MD;  Location: [facility];  Service: Gynecology;  Laterality: N/A;  Laparoscopic Repair of Incidental Cystotomy   BREAST REDUCTION SURGERY  08/05/2011   Procedure: MAMMARY REDUCTION  (BREAST);  Surgeon: Louisa Second, MD;  Location: [facility];  Service: Plastics;   Laterality: Bilateral;  bilateral   CESAREAN SECTION  12/29/2000; 1994   CHOLECYSTECTOMY     age 53   DILATION AND CURETTAGE OF UTERUS  07/08/2003   open laparoscopy   ESOPHAGEAL MANOMETRY N/A 01/12/2018   Procedure: ESOPHAGEAL MANOMETRY (EM);  Surgeon: Napoleon Form, MD;  Location: [facility];  Service: Endoscopy;  Laterality: N/A;   GASTRIC ROUX-EN-Y N/A 03/19/2022   Procedure: LAPAROSCOPIC ROUX-EN-Y GASTRIC BYPASS WITH UPPER ENDOSCOPY;  Surgeon: Gaynelle Adu, MD;  Location: [facility];  Service: General;  Laterality: N/A;   GIVENS CAPSULE STUDY N/A 06/23/2017   Procedure: GIVENS CAPSULE STUDY;  Surgeon: Benancio Deeds, MD;  Location: [facility];  Service: Gastroenterology;  Laterality: N/A;   HIATAL HERNIA REPAIR N/A 03/19/2022   Procedure: HERNIA REPAIR HIATAL;  Surgeon: Gaynelle Adu, MD;  Location: [facility];  Service: General;  Laterality: N/A;   LAPAROSCOPIC SALPINGOOPHERECTOMY  01/14/2011   left   LAPAROSCOPY N/A 11/01/2022   Procedure: LAPAROSCOPY DIAGNOSTIC; LAPARSCOPIC CLOSURE OF PETERSEN SPACE;  Surgeon: Gaynelle Adu, MD;  Location: [facility];  Service: General;  Laterality: N/A;  DR Gerrit Friends TO ASSIST   PH IMPEDANCE STUDY N/A 01/12/2018   Procedure: PH IMPEDANCE STUDY;  Surgeon:  Napoleon Form, MD;  Location: [facility];  Service: Endoscopy;  Laterality: N/A;   RESECTION DISTAL CLAVICAL Right 09/25/2017   Procedure: RESECTION DISTAL CLAVICAL;  Surgeon: Bjorn Pippin, MD;  Location: [facility];  Service: Orthopedics;  Laterality: Right;   RIGHT OOPHORECTOMY  2011   with lysis of adhesions   SHOULDER ACROMIOPLASTY Right 09/25/2017   Procedure: SHOULDER ACROMIOPLASTY;  Surgeon: Bjorn Pippin, MD;  Location: [facility];  Service: Orthopedics;  Laterality: Right;   SHOULDER ARTHROSCOPY WITH DEBRIDEMENT AND BICEP TENDON REPAIR Right 09/25/2017   Procedure: SHOULDER ARTHROSCOPY WITH DEBRIDEMENT AND BICEP TENDON REPAIR;  Surgeon: Bjorn Pippin,  MD;  Location: [facility];  Service: Orthopedics;  Laterality: Right;   SHOULDER ARTHROSCOPY WITH ROTATOR CUFF REPAIR Right 09/25/2017   Procedure: SHOULDER ARTHROSCOPY WITH ROTATOR CUFF REPAIR;  Surgeon: Bjorn Pippin, MD;  Location: [facility];  Service: Orthopedics;  Laterality: Right;   TONSILLECTOMY  as a child   TUBAL LIGATION  12/29/2000   UPPER GASTROINTESTINAL ENDOSCOPY     UPPER GI ENDOSCOPY N/A 11/01/2022   Procedure: UPPER GI ENDOSCOPY;  Surgeon: Gaynelle Adu, MD;  Location: [facility];  Service: General;  Laterality: N/A;   Patient Active Problem List   Diagnosis Date Noted   Class 1 obesity due to excess calories with serious comorbidity and body mass index (BMI) of 33.0 to 33.9 in adult 11/05/2022   COVID-19 vaccination declined 11/05/2022   Abdominal pain 08/21/2022   Anemia of pregnancy 08/21/2022   Chronic pelvic pain in female 08/21/2022   History of cesarean section 08/21/2022   Irregular intermenstrual bleeding 08/21/2022   S/P gastric bypass 03/19/2022   Microcytic anemia 12/14/2021   Iron deficiency anemia 12/14/2021   Adverse food reaction 03/21/2021   Depressive disorder 01/10/2021   Osteoporosis 01/10/2021   Thyroid nodule 01/10/2021   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   Ascites 10/05/2020   Muscle tension dysphonia 08/10/2020   Other insomnia 07/27/2020   At risk for side effect of medication 07/27/2020   Other hyperlipidemia 05/15/2020   At risk for osteoporosis 05/15/2020   At risk for impaired metabolic function 04/04/2020   Other fatigue 03/22/2020   SOBOE (shortness of breath on exertion) 03/22/2020   Exertional dyspnea 03/14/2020   Cyst of ovary 03/11/2020   Endometriosis 03/11/2020   Herpes simplex type 2 infection 03/11/2020   Neuropathy, right side of face 03/08/2020   At risk for depression 03/08/2020   Primary osteoarthritis of both feet 02/26/2020   Hyperlipidemia 01/26/2020   At risk for heart disease 01/26/2020    Daytime somnolence 01/26/2020   Abnormal glucose 01/26/2020   Vitamin D deficiency 01/04/2020   Fatty liver 01/04/2020   B12 deficiency 01/04/2020   Moderate persistent asthma without complication 12/31/2019   Inappropriate sinus tachycardia (HCC) 12/08/2019   Apnea 12/08/2019   Anaphylactic reaction due to food, subsequent encounter 11/22/2019   Idiopathic urticaria 11/22/2019   Generalized anxiety disorder 04/08/2018   Atypical chest pain    Regurgitation of food    Laryngopharyngeal reflux (LPR) 07/30/2017   ANA positive 12/30/2016   Gastroesophageal reflux disease 06/27/2016   Disturbance of skin sensation 04/27/2014   Dizziness and giddiness 03/14/2014   Headache, migraine 03/14/2014   Myalgia and myositis, unspecified 06/07/2013   Nausea and vomiting in adult 08/22/2012   Pelvic pain in female 12/18/2010   ANEMIA-IRON DEFICIENCY 10/15/2006   Genital herpes 09/30/2006   Obesity (BMI 35.0-39.9 without comorbidity) 09/30/2006  Nonorganic sleep disorder 09/30/2006   Constipation 09/30/2006   NEURITIS, LUMBOSACRAL NOS 09/30/2006   Personal history of mental disorder 09/30/2006    PCP: [name], FNP   REFERRING PROVIDER: Teryl Lucy, MD  REFERRING DIAG: CERVICALGIA WITH RADICULOPATHY  THERAPY DIAG:  Cervicalgia  Muscle weakness (generalized)  Abnormal posture  Rationale for Evaluation and Treatment: Rehabilitation  ONSET DATE: chronic  SUBJECTIVE:                                                                                                                                                                                                         SUBJECTIVE STATEMENT: R shoulder pain fluctuates from 2-5/10, unsure of aggravating factors  PERTINENT HISTORY:  HPI patient is a pleasant 47 year old female who comes in today with continued right shoulder and neck pain.  We have seen her several times for this.  She has had 2 previous right shoulder surgeries  without significant relief of symptoms.  She has had subsequent subacromial and glenohumeral cortisone injections without much relief.  MRI of the cervical spine and shoulder were obtained back in August of this year.  Neck MRI showed minimal cervical spine changes without any disc bulge or spinal canal or neural foraminal narrowing.  Right shoulder MRI showed mild supraspinatus tendinopathy.  No other findings.  She has been seeing Dr. Titus Dubin as she is previously had a positive ANA with sicca symptoms.  She is currently being referred to [facility] rheumatology for further work-up.   Review of Systems as detailed in HPI.  All others reviewed and are negative.  PAIN:  Are you having pain? Yes: NPRS scale: 5/10 Pain location: R shoulder/neck Pain description: ache Aggravating factors: activity, OH reaching Relieving factors: rest  PRECAUTIONS: None  RED FLAGS: None     WEIGHT BEARING RESTRICTIONS: No  FALLS:  Has patient fallen in last 6 months? No  OCCUPATION: bus driver  PLOF: Independent  PATIENT GOALS: To manage my neck pain  NEXT MD VISIT: 11/24  OBJECTIVE:  Note: Objective measures were completed at Evaluation unless otherwise noted.  DIAGNOSTIC FINDINGS:  None available  PATIENT SURVEYS:  FOTO 43(53 predicted)  03/18/23 63  POSTURE:  elevated R shoulder  PALPATION: TTP R SCM, scalenes, UT and infraspinatus    CERVICAL ROM:   Active ROM A/PROM (deg) eval 03/10/23 03/18/23  Flexion 50% 50% 50%  Extension 50% 50% 90%  Right lateral flexion 90% 100% 100%  Left lateral flexion 25% 75% 75%  Right rotation 75% 90% 90%  Left rotation 50% 75% 90%   (Blank  rows = not tested)  UPPER EXTREMITY ROM:  Active ROM Right eval Left eval 03/10/23 03/18/23  Shoulder flexion 160 170 160d 160d  Shoulder extension      Shoulder abduction 150 170 160d 160d  Shoulder adduction      Shoulder extension      Shoulder internal rotation [facility] WFL    Shoulder external rotation [facility]  WFL    Elbow flexion      Elbow extension      Wrist flexion      Wrist extension      Wrist ulnar deviation      Wrist radial deviation      Wrist pronation      Wrist supination       (Blank rows = not tested)  UPPER EXTREMITY MMT: WFL throughout  MMT Right eval Left eval  Shoulder flexion    Shoulder extension    Shoulder abduction    Shoulder adduction    Shoulder extension    Shoulder internal rotation    Shoulder external rotation    Middle trapezius    Lower trapezius    Elbow flexion    Elbow extension    Wrist flexion    Wrist extension    Wrist ulnar deviation    Wrist radial deviation    Wrist pronation    Wrist supination    Grip strength     (Blank rows = not tested)  CERVICAL SPECIAL TESTS:  Neck flexor muscle endurance test: Positive 15s hold due to weakness; 03/10/23 27s  FUNCTIONAL TESTS:  5 times sit to stand: 12s  TODAY'S TREATMENT:    [facility] Adult PT Treatment:                                                DATE: 03/18/23 Therapeutic Exercise: Nustep L5 6 min Prone row 15# 2# Prone extension 2# 15x Prone hor abd 2# 15x Prone flexion 2# 15x Prone scaption 2# 15x Re-assess  [facility] Adult PT Treatment:                                                DATE: 03/12/23 Therapeutic Exercise: Nustep L4 6 min Omega row 25# high/low grip 15/15 Omega lat pulldown 25# 15x Scapular wall slides RTB 15x Prone row 15# 2# Prone extension 2# 15x Prone hor abd 2# 15x Prone flexion 2# 15x Prone scaption 2# 15x Joint mobs inf/post/dist 5x10 Rhythmic stabilization 60/90/120 60s hold PNF D1 F/E  [facility] Adult PT Treatment:                                                DATE: 03/10/23 Therapeutic Exercise: Nustep L4 6 min Omega row 20# high/low grip 15/15 Omega lat pulldown 20# 15x Scapular wall slides YTB 15x Prone row 15# 2# Prone extension 2# 15x Prone hor abd 2# 15x Prone flexion 2# 15x Prone scaption 2# 15x  DATE: 02/04/23 Eval   PATIENT EDUCATION:  Education details: Discussed eval findings, rehab rationale and POC and patient is in agreement  Person educated: Patient Education method: Explanation Education comprehension: verbalized understanding and needs further education  HOME EXERCISE PROGRAM: Access Code: [email or web address] URL: [email or web address] Date: 03/18/2023 Prepared by: Gustavus Bryant  Exercises - Shoulder External Rotation and Scapular Retraction with Resistance  - 2 x daily - 5 x weekly - 2 sets - 15 reps - Standing Shoulder Horizontal Abduction with Resistance  - 2 x daily - 5 x weekly - 2 sets - 15 reps - Scaption Wall Slide with Towel  - 2 x daily - 5 x weekly - 2 sets - 15 reps - Standing Shoulder Scaption with Resistance  - 2 x daily - 5 x weekly - 2 sets - 15 reps - Sternocleidomastoid Stretch  - 2 x daily - 5 x weekly - 1 sets - 2 reps - 30s hold  ASSESSMENT:  CLINICAL IMPRESSION: Assessed progress to date.  FOTO and pain goals met.  C-ROM also increased.  R SCM tightness persists and home stretching demonstrated to patient.  Recommend continue OPPT to address residual deficits of cervical mobility loss and postural changes   OBJECTIVE IMPAIRMENTS: decreased activity tolerance, decreased knowledge of condition, decreased mobility, decreased ROM, decreased strength, impaired UE functional use, postural dysfunction, and pain.   ACTIVITY LIMITATIONS: carrying, lifting, and reach over head  PERSONAL FACTORS: Fitness, Time since onset of injury/illness/exacerbation, and 1 comorbidity: R RC pathology  are also affecting patient's functional outcome.   REHAB POTENTIAL: Good  CLINICAL DECISION MAKING: Stable/uncomplicated  EVALUATION COMPLEXITY: Low   GOALS: Goals reviewed with patient? No  SHORT TERM GOALS: Target date: 03/18/2023  Patient to demonstrate independence in HEP   Baseline:  [id number] Goal status: Met  2.  Increase L lateral flexion to 50% Baseline:  Active ROM A/PROM (deg) eval 03/10/23 03/18/23  Flexion 50% 50% 50%  Extension 50% 50% 90%  Right lateral flexion 90% 100% 100%  Left lateral flexion 25% 75% 75%  Right rotation 75% 90% 90%  Left rotation 50% 75% 90%   Goal status: Met   LONG TERM GOALS: Target date: 03/18/2023  Patient will score at least 53% on FOTO to signify clinically meaningful improvement in functional abilities.   Baseline: 43%; 03/18/23 63% Goal status: Met  2.  Increase cervical ROM to 75% throughout Baseline:  Active ROM A/PROM (deg) eval 03/10/23  Flexion 50% 50%  Extension 50% 50%  Right lateral flexion 90% 100%  Left lateral flexion 25% 75%  Right rotation 75% 90%  Left rotation 50% 75%   Goal status: Ongoing  3.  Decrease worst pain to 6/10 Baseline: 8/10; 03/18/23 5/10 Goal status: Met  4.  Decrease tenderness in R UT, SCM, infraspinatus and scalene groups to minimal Baseline: moderate Goal status: Ongoing   PLAN:  PT FREQUENCY: 1x/week  PT DURATION: 5 weeks  PLANNED INTERVENTIONS: 97164- PT Re-evaluation, 97110-Therapeutic exercises, 97530- Therapeutic activity, 97112- Neuromuscular re-education, 97535- Self Care, 47829- Manual therapy, 97116- Gait training, 97014- Electrical stimulation (unattended), Patient/Family education, Balance training, Dry Needling, Joint mobilization, Spinal mobilization, Cryotherapy, and Moist heat  PLAN FOR NEXT SESSION: HEP review and update, manual techniques as appropriate, aerobic tasks, ROM and flexibility activities, strengthening and PREs, TPDN, gait and balance training as needed   For all possible CPT codes, reference the Planned Interventions line above.     Check all conditions that are expected to impact treatment: {Conditions expected to  impact treatment:Complications related to surgery   If treatment provided at initial evaluation, no treatment  charged due to lack of authorization.       Hildred Laser, PT 03/19/2023, 2:47 PM

## 2023-03-18 ENCOUNTER — Ambulatory Visit: Payer: Medicaid Other

## 2023-03-18 DIAGNOSIS — M6281 Muscle weakness (generalized): Secondary | ICD-10-CM

## 2023-03-18 DIAGNOSIS — R293 Abnormal posture: Secondary | ICD-10-CM

## 2023-03-18 DIAGNOSIS — M542 Cervicalgia: Secondary | ICD-10-CM | POA: Diagnosis not present

## 2023-03-19 DIAGNOSIS — F411 Generalized anxiety disorder: Secondary | ICD-10-CM | POA: Diagnosis not present

## 2023-03-19 DIAGNOSIS — F330 Major depressive disorder, recurrent, mild: Secondary | ICD-10-CM | POA: Diagnosis not present

## 2023-03-19 NOTE — Therapy (Unsigned)
OUTPATIENT PHYSICAL THERAPY TREATMENT NOTE   Patient Name: [name] MRN: [id number] DOB:1975-10-01, 47 y.o., female Today's Date: 03/20/2023  END OF SESSION:  PT End of Session - 03/20/23 1635     Visit Number 8    Number of Visits 12    Date for PT Re-Evaluation 04/06/23    Authorization Type MCD    Authorization Time Period 7 visits approved 02/17/23-04/17/23    Authorization - Number of Visits 7    PT Start Time 1622    PT Stop Time 1700    PT Time Calculation (min) 38 min    Activity Tolerance Patient tolerated treatment well;Patient limited by pain    Behavior During Therapy [facility] for tasks assessed/performed                     Past Medical History:  Diagnosis Date   Allergy    Anemia    Anxiety    Apnea 12/08/2019   Arthritis    Asthma    Back pain    Constipation    Exertional dyspnea 03/14/2020   Fatty liver    GERD (gastroesophageal reflux disease)    H/O: hysterectomy    Hx of endometriosis    Inappropriate sinus tachycardia (HCC) 12/08/2019   Insomnia    Joint pain    Lactose intolerance    Migraine    Morbid obesity (HCC) 12/08/2019   Osteoporosis    Pneumonia    Pre-diabetes    Urticaria    Vitamin D deficiency    Past Surgical History:  Procedure Laterality Date   37 HOUR PH STUDY N/A 01/12/2018   Procedure: 24 HOUR PH STUDY;  Surgeon: Napoleon Form, MD;  Location: [facility];  Service: Endoscopy;  Laterality: N/A;   ABDOMINAL HYSTERECTOMY  12/12/2003   ADENOIDECTOMY  05/2011   ANKLE ARTHROSCOPY  12/29/2007   right; with extensive debridement   BLADDER NECK RECONSTRUCTION  01/14/2011   Procedure: BLADDER NECK REPAIR;  Surgeon: Reva Bores, MD;  Location: [facility];  Service: Gynecology;  Laterality: N/A;  Laparoscopic Repair of Incidental Cystotomy   BREAST REDUCTION SURGERY  08/05/2011   Procedure: MAMMARY REDUCTION  (BREAST);  Surgeon: Louisa Second, MD;  Location: [facility];  Service:  Plastics;  Laterality: Bilateral;  bilateral   CESAREAN SECTION  12/29/2000; 1994   CHOLECYSTECTOMY     age 35   DILATION AND CURETTAGE OF UTERUS  07/08/2003   open laparoscopy   ESOPHAGEAL MANOMETRY N/A 01/12/2018   Procedure: ESOPHAGEAL MANOMETRY (EM);  Surgeon: Napoleon Form, MD;  Location: [facility];  Service: Endoscopy;  Laterality: N/A;   GASTRIC ROUX-EN-Y N/A 03/19/2022   Procedure: LAPAROSCOPIC ROUX-EN-Y GASTRIC BYPASS WITH UPPER ENDOSCOPY;  Surgeon: Gaynelle Adu, MD;  Location: [facility];  Service: General;  Laterality: N/A;   GIVENS CAPSULE STUDY N/A 06/23/2017   Procedure: GIVENS CAPSULE STUDY;  Surgeon: Benancio Deeds, MD;  Location: [facility];  Service: Gastroenterology;  Laterality: N/A;   HIATAL HERNIA REPAIR N/A 03/19/2022   Procedure: HERNIA REPAIR HIATAL;  Surgeon: Gaynelle Adu, MD;  Location: [facility];  Service: General;  Laterality: N/A;   LAPAROSCOPIC SALPINGOOPHERECTOMY  01/14/2011   left   LAPAROSCOPY N/A 11/01/2022   Procedure: LAPAROSCOPY DIAGNOSTIC; LAPARSCOPIC CLOSURE OF PETERSEN SPACE;  Surgeon: Gaynelle Adu, MD;  Location: [facility];  Service: General;  Laterality: N/A;  DR Gerrit Friends TO ASSIST   PH IMPEDANCE STUDY N/A 01/12/2018   Procedure: PH IMPEDANCE STUDY;  Surgeon: Napoleon Form, MD;  Location: [facility];  Service: Endoscopy;  Laterality: N/A;   RESECTION DISTAL CLAVICAL Right 09/25/2017   Procedure: RESECTION DISTAL CLAVICAL;  Surgeon: Bjorn Pippin, MD;  Location: [facility];  Service: Orthopedics;  Laterality: Right;   RIGHT OOPHORECTOMY  2011   with lysis of adhesions   SHOULDER ACROMIOPLASTY Right 09/25/2017   Procedure: SHOULDER ACROMIOPLASTY;  Surgeon: Bjorn Pippin, MD;  Location: [facility];  Service: Orthopedics;  Laterality: Right;   SHOULDER ARTHROSCOPY WITH DEBRIDEMENT AND BICEP TENDON REPAIR Right 09/25/2017   Procedure: SHOULDER ARTHROSCOPY WITH DEBRIDEMENT AND BICEP TENDON REPAIR;  Surgeon:  Bjorn Pippin, MD;  Location: [facility];  Service: Orthopedics;  Laterality: Right;   SHOULDER ARTHROSCOPY WITH ROTATOR CUFF REPAIR Right 09/25/2017   Procedure: SHOULDER ARTHROSCOPY WITH ROTATOR CUFF REPAIR;  Surgeon: Bjorn Pippin, MD;  Location: [facility];  Service: Orthopedics;  Laterality: Right;   TONSILLECTOMY  as a child   TUBAL LIGATION  12/29/2000   UPPER GASTROINTESTINAL ENDOSCOPY     UPPER GI ENDOSCOPY N/A 11/01/2022   Procedure: UPPER GI ENDOSCOPY;  Surgeon: Gaynelle Adu, MD;  Location: [facility];  Service: General;  Laterality: N/A;   Patient Active Problem List   Diagnosis Date Noted   Class 1 obesity due to excess calories with serious comorbidity and body mass index (BMI) of 33.0 to 33.9 in adult 11/05/2022   COVID-19 vaccination declined 11/05/2022   Abdominal pain 08/21/2022   Anemia of pregnancy 08/21/2022   Chronic pelvic pain in female 08/21/2022   History of cesarean section 08/21/2022   Irregular intermenstrual bleeding 08/21/2022   S/P gastric bypass 03/19/2022   Microcytic anemia 12/14/2021   Iron deficiency anemia 12/14/2021   Adverse food reaction 03/21/2021   Depressive disorder 01/10/2021   Osteoporosis 01/10/2021   Thyroid nodule 01/10/2021   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   Ascites 10/05/2020   Muscle tension dysphonia 08/10/2020   Other insomnia 07/27/2020   At risk for side effect of medication 07/27/2020   Other hyperlipidemia 05/15/2020   At risk for osteoporosis 05/15/2020   At risk for impaired metabolic function 04/04/2020   Other fatigue 03/22/2020   SOBOE (shortness of breath on exertion) 03/22/2020   Exertional dyspnea 03/14/2020   Cyst of ovary 03/11/2020   Endometriosis 03/11/2020   Herpes simplex type 2 infection 03/11/2020   Neuropathy, right side of face 03/08/2020   At risk for depression 03/08/2020   Primary osteoarthritis of both feet 02/26/2020   Hyperlipidemia 01/26/2020   At risk for heart disease  01/26/2020   Daytime somnolence 01/26/2020   Abnormal glucose 01/26/2020   Vitamin D deficiency 01/04/2020   Fatty liver 01/04/2020   B12 deficiency 01/04/2020   Moderate persistent asthma without complication 12/31/2019   Inappropriate sinus tachycardia (HCC) 12/08/2019   Apnea 12/08/2019   Anaphylactic reaction due to food, subsequent encounter 11/22/2019   Idiopathic urticaria 11/22/2019   Generalized anxiety disorder 04/08/2018   Atypical chest pain    Regurgitation of food    Laryngopharyngeal reflux (LPR) 07/30/2017   ANA positive 12/30/2016   Gastroesophageal reflux disease 06/27/2016   Disturbance of skin sensation 04/27/2014   Dizziness and giddiness 03/14/2014   Headache, migraine 03/14/2014   Myalgia and myositis, unspecified 06/07/2013   Nausea and vomiting in adult 08/22/2012   Pelvic pain in female 12/18/2010   ANEMIA-IRON DEFICIENCY 10/15/2006   Genital herpes 09/30/2006   Obesity (BMI 35.0-39.9 without comorbidity)  09/30/2006   Nonorganic sleep disorder 09/30/2006   Constipation 09/30/2006   NEURITIS, LUMBOSACRAL NOS 09/30/2006   Personal history of mental disorder 09/30/2006    PCP: [name], FNP   REFERRING PROVIDER: Teryl Lucy, MD  REFERRING DIAG: CERVICALGIA WITH RADICULOPATHY  THERAPY DIAG:  Cervicalgia  Muscle weakness (generalized)  Abnormal posture  Rationale for Evaluation and Treatment: Rehabilitation  ONSET DATE: chronic  SUBJECTIVE:                                                                                                                                                                                                         SUBJECTIVE STATEMENT: Agreeable to TPDN of posterior R shoulder  PERTINENT HISTORY:  HPI patient is a pleasant 47 year old female who comes in today with continued right shoulder and neck pain.  We have seen her several times for this.  She has had 2 previous right shoulder surgeries without  significant relief of symptoms.  She has had subsequent subacromial and glenohumeral cortisone injections without much relief.  MRI of the cervical spine and shoulder were obtained back in August of this year.  Neck MRI showed minimal cervical spine changes without any disc bulge or spinal canal or neural foraminal narrowing.  Right shoulder MRI showed mild supraspinatus tendinopathy.  No other findings.  She has been seeing Dr. Titus Dubin as she is previously had a positive ANA with sicca symptoms.  She is currently being referred to [facility] rheumatology for further work-up.   Review of Systems as detailed in HPI.  All others reviewed and are negative.  PAIN:  Are you having pain? Yes: NPRS scale: 5/10 Pain location: R shoulder/neck Pain description: ache Aggravating factors: activity, OH reaching Relieving factors: rest  PRECAUTIONS: None  RED FLAGS: None     WEIGHT BEARING RESTRICTIONS: No  FALLS:  Has patient fallen in last 6 months? No  OCCUPATION: bus driver  PLOF: Independent  PATIENT GOALS: To manage my neck pain  NEXT MD VISIT: 11/24  OBJECTIVE:  Note: Objective measures were completed at Evaluation unless otherwise noted.  DIAGNOSTIC FINDINGS:  None available  PATIENT SURVEYS:  FOTO 43(53 predicted)  03/18/23 63  POSTURE:  elevated R shoulder  PALPATION: TTP R SCM, scalenes, UT and infraspinatus    CERVICAL ROM:   Active ROM A/PROM (deg) eval 03/10/23 03/18/23  Flexion 50% 50% 50%  Extension 50% 50% 90%  Right lateral flexion 90% 100% 100%  Left lateral flexion 25% 75% 75%  Right rotation 75% 90% 90%  Left rotation 50% 75% 90%   (Blank  rows = not tested)  UPPER EXTREMITY ROM:  Active ROM Right eval Left eval 03/10/23 03/18/23  Shoulder flexion 160 170 160d 160d  Shoulder extension      Shoulder abduction 150 170 160d 160d  Shoulder adduction      Shoulder extension      Shoulder internal rotation [facility] WFL    Shoulder external rotation [facility] WFL     Elbow flexion      Elbow extension      Wrist flexion      Wrist extension      Wrist ulnar deviation      Wrist radial deviation      Wrist pronation      Wrist supination       (Blank rows = not tested)  UPPER EXTREMITY MMT: WFL throughout  MMT Right eval Left eval  Shoulder flexion    Shoulder extension    Shoulder abduction    Shoulder adduction    Shoulder extension    Shoulder internal rotation    Shoulder external rotation    Middle trapezius    Lower trapezius    Elbow flexion    Elbow extension    Wrist flexion    Wrist extension    Wrist ulnar deviation    Wrist radial deviation    Wrist pronation    Wrist supination    Grip strength     (Blank rows = not tested)  CERVICAL SPECIAL TESTS:  Neck flexor muscle endurance test: Positive 15s hold due to weakness; 03/10/23 27s  FUNCTIONAL TESTS:  5 times sit to stand: 12s  TODAY'S TREATMENT:    [facility] Adult PT Treatment:                                                DATE: 03/20/23 Therapeutic Exercise: Nustep L4 6 min Omega row 25# high/low grip 15/15 Omega lat pulldown 25# 15x 4 way scapula 15x ea. IR stretch on stabilized scapula. Manual Therapy: Skilled palpation to identify taught and irritable bands in R teres Major Trigger Point Dry Needling Treatment: Pre-treatment instruction: Patient instructed on dry needling rationale, procedures, and possible side effects including pain during treatment (achy,cramping feeling), bruising, drop of blood, lightheadedness, nausea, sweating. Patient Consent Given: Yes Education handout provided: No Muscles treated: R Teres minor  Needle size and number: .30x62mm x 1 and .25x33mm x 1 Electrical stimulation performed: No Parameters: N/A Treatment response/outcome: Palpable decrease in muscle tension Post-treatment instructions: Patient instructed to expect possible mild to moderate muscle soreness later today and/or tomorrow. Patient instructed in methods to reduce  muscle soreness and to continue prescribed HEP. If patient was dry needled over the lung field, patient was instructed on signs and symptoms of pneumothorax and, however unlikely, to see immediate medical attention should they occur. Patient was also educated on signs and symptoms of infection and to seek medical attention should they occur. Patient verbalized understanding of these instructions and education.    [facility] Adult PT Treatment:                                                DATE: 03/18/23 Therapeutic Exercise: Nustep L5 6 min Prone row 15# 2# Prone extension 2# 15x Prone  hor abd 2# 15x Prone flexion 2# 15x Prone scaption 2# 15x Re-assess  [facility] Adult PT Treatment:                                                DATE: 03/12/23 Therapeutic Exercise: Nustep L4 6 min Omega row 25# high/low grip 15/15 Omega lat pulldown 25# 15x Scapular wall slides RTB 15x Prone row 15# 2# Prone extension 2# 15x Prone hor abd 2# 15x Prone flexion 2# 15x Prone scaption 2# 15x Joint mobs inf/post/dist 5x10 Rhythmic stabilization 60/90/120 60s hold PNF D1 F/E  [facility] Adult PT Treatment:                                                DATE: 03/10/23 Therapeutic Exercise: Nustep L4 6 min Omega row 20# high/low grip 15/15 Omega lat pulldown 20# 15x Scapular wall slides YTB 15x Prone row 15# 2# Prone extension 2# 15x Prone hor abd 2# 15x Prone flexion 2# 15x Prone scaption 2# 15x                                                                                                                          DATE: 02/04/23 Eval   PATIENT EDUCATION:  Education details: Discussed eval findings, rehab rationale and POC and patient is in agreement  Person educated: Patient Education method: Explanation Education comprehension: verbalized understanding and needs further education  HOME EXERCISE PROGRAM: Access Code: [email or web address] URL: [email or web address] Date: 03/18/2023 Prepared by: Gustavus Bryant  Exercises - Shoulder External Rotation and Scapular Retraction with Resistance  - 2 x daily - 5 x weekly - 2 sets - 15 reps - Standing Shoulder Horizontal Abduction with Resistance  - 2 x daily - 5 x weekly - 2 sets - 15 reps - Scaption Wall Slide with Towel  - 2 x daily - 5 x weekly - 2 sets - 15 reps - Standing Shoulder Scaption with Resistance  - 2 x daily - 5 x weekly - 2 sets - 15 reps - Sternocleidomastoid Stretch  - 2 x daily - 5 x weekly - 1 sets - 2 reps - 30s hold  ASSESSMENT:  CLINICAL IMPRESSION: Agreeable to TPDN posterior R shoulder.  Palpable decrease in tension and irritation.  Followed technique with posterior shoulder strengthening   OBJECTIVE IMPAIRMENTS: decreased activity tolerance, decreased knowledge of condition, decreased mobility, decreased ROM, decreased strength, impaired UE functional use, postural dysfunction, and pain.   ACTIVITY LIMITATIONS: carrying, lifting, and reach over head  PERSONAL FACTORS: Fitness, Time since onset of injury/illness/exacerbation, and 1 comorbidity: R RC pathology  are also affecting patient's functional outcome.   REHAB POTENTIAL: Good  CLINICAL DECISION MAKING: Stable/uncomplicated  EVALUATION COMPLEXITY: Low  GOALS: Goals reviewed with patient? No  SHORT TERM GOALS: Target date: 03/18/2023  Patient to demonstrate independence in HEP  Baseline:  [id number] Goal status: Met  2.  Increase L lateral flexion to 50% Baseline:  Active ROM A/PROM (deg) eval 03/10/23 03/18/23  Flexion 50% 50% 50%  Extension 50% 50% 90%  Right lateral flexion 90% 100% 100%  Left lateral flexion 25% 75% 75%  Right rotation 75% 90% 90%  Left rotation 50% 75% 90%   Goal status: Met   LONG TERM GOALS: Target date: 03/18/2023  Patient will score at least 53% on FOTO to signify clinically meaningful improvement in functional abilities.   Baseline: 43%; 03/18/23 63% Goal status: Met  2.  Increase cervical ROM to 75%  throughout Baseline:  Active ROM A/PROM (deg) eval 03/10/23  Flexion 50% 50%  Extension 50% 50%  Right lateral flexion 90% 100%  Left lateral flexion 25% 75%  Right rotation 75% 90%  Left rotation 50% 75%   Goal status: Ongoing  3.  Decrease worst pain to 6/10 Baseline: 8/10; 03/18/23 5/10 Goal status: Met  4.  Decrease tenderness in R UT, SCM, infraspinatus and scalene groups to minimal Baseline: moderate Goal status: Ongoing   PLAN:  PT FREQUENCY: 1x/week  PT DURATION: 5 weeks  PLANNED INTERVENTIONS: Therapeutic exercises, Therapeutic activity, Neuromuscular re-education, Balance training, Gait training, Patient/Family education, Self Care, Joint mobilization, Dry Needling, Electrical stimulation, Spinal mobilization, Cryotherapy, Moist heat, Manual therapy, and Re-evaluation  PLAN FOR NEXT SESSION: HEP review and update, manual techniques as appropriate, aerobic tasks, ROM and flexibility activities, strengthening and PREs, TPDN, gait and balance training as needed   For all possible CPT codes, reference the Planned Interventions line above.     Check all conditions that are expected to impact treatment: {Conditions expected to impact treatment:Complications related to surgery   If treatment provided at initial evaluation, no treatment charged due to lack of authorization.       Hildred Laser, PT 03/20/2023, 5:03 PM

## 2023-03-20 ENCOUNTER — Ambulatory Visit: Payer: Medicaid Other

## 2023-03-20 ENCOUNTER — Ambulatory Visit: Payer: Medicaid Other | Admitting: Dietician

## 2023-03-20 DIAGNOSIS — M6281 Muscle weakness (generalized): Secondary | ICD-10-CM

## 2023-03-20 DIAGNOSIS — R293 Abnormal posture: Secondary | ICD-10-CM

## 2023-03-20 DIAGNOSIS — M542 Cervicalgia: Secondary | ICD-10-CM | POA: Diagnosis not present

## 2023-03-25 ENCOUNTER — Ambulatory Visit: Payer: Self-pay

## 2023-03-25 DIAGNOSIS — M542 Cervicalgia: Secondary | ICD-10-CM

## 2023-03-25 DIAGNOSIS — M6281 Muscle weakness (generalized): Secondary | ICD-10-CM

## 2023-03-25 DIAGNOSIS — R293 Abnormal posture: Secondary | ICD-10-CM

## 2023-03-25 NOTE — Therapy (Signed)
OUTPATIENT PHYSICAL THERAPY TREATMENT NOTE   Patient Name: [name] MRN: [id number] DOB:[birth year], 47 y.o., female Today's Date: 03/25/2023  END OF SESSION:  PT End of Session - 03/25/23 1750     Visit Number 9    Number of Visits 12    Date for PT Re-Evaluation 04/06/23    Authorization Type MCD    Authorization Time Period 7 visits approved 02/17/23-04/17/23    Authorization - Number of Visits 7    PT Start Time 1745    PT Stop Time 1830    PT Time Calculation (min) 45 min    Activity Tolerance Patient tolerated treatment well;Patient limited by pain    Behavior During Therapy [facility] for tasks assessed/performed             Past Medical History:  Diagnosis Date   Allergy    Anemia    Anxiety    Apnea 12/08/2019   Arthritis    Asthma    Back pain    Constipation    Exertional dyspnea 03/14/2020   Fatty liver    GERD (gastroesophageal reflux disease)    H/O: hysterectomy    Hx of endometriosis    Inappropriate sinus tachycardia (HCC) 12/08/2019   Insomnia    Joint pain    Lactose intolerance    Migraine    Morbid obesity (HCC) 12/08/2019   Osteoporosis    Pneumonia    Pre-diabetes    Urticaria    Vitamin D deficiency    Past Surgical History:  Procedure Laterality Date   21 HOUR PH STUDY N/A 01/12/2018   Procedure: 24 HOUR PH STUDY;  Surgeon: Napoleon Form, MD;  Location: [facility];  Service: Endoscopy;  Laterality: N/A;   ABDOMINAL HYSTERECTOMY  12/12/2003   ADENOIDECTOMY  05/2011   ANKLE ARTHROSCOPY  12/29/2007   right; with extensive debridement   BLADDER NECK RECONSTRUCTION  01/14/2011   Procedure: BLADDER NECK REPAIR;  Surgeon: Reva Bores, MD;  Location: [facility];  Service: Gynecology;  Laterality: N/A;  Laparoscopic Repair of Incidental Cystotomy   BREAST REDUCTION SURGERY  08/05/2011   Procedure: MAMMARY REDUCTION  (BREAST);  Surgeon: Louisa Second, MD;  Location: [facility];  Service: Plastics;  Laterality:  Bilateral;  bilateral   CESAREAN SECTION  12/29/2000; 1994   CHOLECYSTECTOMY     age 52   DILATION AND CURETTAGE OF UTERUS  07/08/2003   open laparoscopy   ESOPHAGEAL MANOMETRY N/A 01/12/2018   Procedure: ESOPHAGEAL MANOMETRY (EM);  Surgeon: Napoleon Form, MD;  Location: [facility];  Service: Endoscopy;  Laterality: N/A;   GASTRIC ROUX-EN-Y N/A 03/19/2022   Procedure: LAPAROSCOPIC ROUX-EN-Y GASTRIC BYPASS WITH UPPER ENDOSCOPY;  Surgeon: Gaynelle Adu, MD;  Location: [facility];  Service: General;  Laterality: N/A;   GIVENS CAPSULE STUDY N/A 06/23/2017   Procedure: GIVENS CAPSULE STUDY;  Surgeon: Benancio Deeds, MD;  Location: [facility];  Service: Gastroenterology;  Laterality: N/A;   HIATAL HERNIA REPAIR N/A 03/19/2022   Procedure: HERNIA REPAIR HIATAL;  Surgeon: Gaynelle Adu, MD;  Location: [facility];  Service: General;  Laterality: N/A;   LAPAROSCOPIC SALPINGOOPHERECTOMY  01/14/2011   left   LAPAROSCOPY N/A 11/01/2022   Procedure: LAPAROSCOPY DIAGNOSTIC; LAPARSCOPIC CLOSURE OF PETERSEN SPACE;  Surgeon: Gaynelle Adu, MD;  Location: [facility];  Service: General;  Laterality: N/A;  DR Gerrit Friends TO ASSIST   PH IMPEDANCE STUDY N/A 01/12/2018   Procedure: PH IMPEDANCE STUDY;  Surgeon: Napoleon Form, MD;  Location: [facility];  Service: Endoscopy;  Laterality: N/A;   RESECTION DISTAL CLAVICAL Right 09/25/2017   Procedure: RESECTION DISTAL CLAVICAL;  Surgeon: Bjorn Pippin, MD;  Location: [facility];  Service: Orthopedics;  Laterality: Right;   RIGHT OOPHORECTOMY  2011   with lysis of adhesions   SHOULDER ACROMIOPLASTY Right 09/25/2017   Procedure: SHOULDER ACROMIOPLASTY;  Surgeon: Bjorn Pippin, MD;  Location: [facility];  Service: Orthopedics;  Laterality: Right;   SHOULDER ARTHROSCOPY WITH DEBRIDEMENT AND BICEP TENDON REPAIR Right 09/25/2017   Procedure: SHOULDER ARTHROSCOPY WITH DEBRIDEMENT AND BICEP TENDON REPAIR;  Surgeon: Bjorn Pippin, MD;   Location: [facility];  Service: Orthopedics;  Laterality: Right;   SHOULDER ARTHROSCOPY WITH ROTATOR CUFF REPAIR Right 09/25/2017   Procedure: SHOULDER ARTHROSCOPY WITH ROTATOR CUFF REPAIR;  Surgeon: Bjorn Pippin, MD;  Location: [facility];  Service: Orthopedics;  Laterality: Right;   TONSILLECTOMY  as a child   TUBAL LIGATION  12/29/2000   UPPER GASTROINTESTINAL ENDOSCOPY     UPPER GI ENDOSCOPY N/A 11/01/2022   Procedure: UPPER GI ENDOSCOPY;  Surgeon: Gaynelle Adu, MD;  Location: [facility];  Service: General;  Laterality: N/A;   Patient Active Problem List   Diagnosis Date Noted   Class 1 obesity due to excess calories with serious comorbidity and body mass index (BMI) of 33.0 to 33.9 in adult 11/05/2022   COVID-19 vaccination declined 11/05/2022   Abdominal pain 08/21/2022   Anemia of pregnancy 08/21/2022   Chronic pelvic pain in female 08/21/2022   History of cesarean section 08/21/2022   Irregular intermenstrual bleeding 08/21/2022   S/P gastric bypass 03/19/2022   Microcytic anemia 12/14/2021   Iron deficiency anemia 12/14/2021   Adverse food reaction 03/21/2021   Depressive disorder 01/10/2021   Osteoporosis 01/10/2021   Thyroid nodule 01/10/2021   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   Ascites 10/05/2020   Muscle tension dysphonia 08/10/2020   Other insomnia 07/27/2020   At risk for side effect of medication 07/27/2020   Other hyperlipidemia 05/15/2020   At risk for osteoporosis 05/15/2020   At risk for impaired metabolic function 04/04/2020   Other fatigue 03/22/2020   SOBOE (shortness of breath on exertion) 03/22/2020   Exertional dyspnea 03/14/2020   Cyst of ovary 03/11/2020   Endometriosis 03/11/2020   Herpes simplex type 2 infection 03/11/2020   Neuropathy, right side of face 03/08/2020   At risk for depression 03/08/2020   Primary osteoarthritis of both feet 02/26/2020   Hyperlipidemia 01/26/2020   At risk for heart disease 01/26/2020    Daytime somnolence 01/26/2020   Abnormal glucose 01/26/2020   Vitamin D deficiency 01/04/2020   Fatty liver 01/04/2020   B12 deficiency 01/04/2020   Moderate persistent asthma without complication 12/31/2019   Inappropriate sinus tachycardia (HCC) 12/08/2019   Apnea 12/08/2019   Anaphylactic reaction due to food, subsequent encounter 11/22/2019   Idiopathic urticaria 11/22/2019   Generalized anxiety disorder 04/08/2018   Atypical chest pain    Regurgitation of food    Laryngopharyngeal reflux (LPR) 07/30/2017   ANA positive 12/30/2016   Gastroesophageal reflux disease 06/27/2016   Disturbance of skin sensation 04/27/2014   Dizziness and giddiness 03/14/2014   Headache, migraine 03/14/2014   Myalgia and myositis, unspecified 06/07/2013   Nausea and vomiting in adult 08/22/2012   Pelvic pain in female 12/18/2010   ANEMIA-IRON DEFICIENCY 10/15/2006   Genital herpes 09/30/2006   Obesity (BMI 35.0-39.9 without comorbidity) 09/30/2006   Nonorganic sleep disorder 09/30/2006  Constipation 09/30/2006   NEURITIS, LUMBOSACRAL NOS 09/30/2006   Personal history of mental disorder 09/30/2006    PCP: [name], FNP   REFERRING PROVIDER: Teryl Lucy, MD  REFERRING DIAG: CERVICALGIA WITH RADICULOPATHY  THERAPY DIAG:  Cervicalgia  Muscle weakness (generalized)  Abnormal posture  Rationale for Evaluation and Treatment: Rehabilitation  ONSET DATE: chronic  SUBJECTIVE:                                                                                                                                                                                                         SUBJECTIVE STATEMENT: Feels she has more mobility in reaching following last session and TPDN  PERTINENT HISTORY:  HPI patient is a pleasant 47 year old female who comes in today with continued right shoulder and neck pain.  We have seen her several times for this.  She has had 2 previous right shoulder surgeries  without significant relief of symptoms.  She has had subsequent subacromial and glenohumeral cortisone injections without much relief.  MRI of the cervical spine and shoulder were obtained back in August of this year.  Neck MRI showed minimal cervical spine changes without any disc bulge or spinal canal or neural foraminal narrowing.  Right shoulder MRI showed mild supraspinatus tendinopathy.  No other findings.  She has been seeing Dr. Titus Dubin as she is previously had a positive ANA with sicca symptoms.  She is currently being referred to [facility] rheumatology for further work-up.   Review of Systems as detailed in HPI.  All others reviewed and are negative.  PAIN:  Are you having pain? Yes: NPRS scale: 5/10 Pain location: R shoulder/neck Pain description: ache Aggravating factors: activity, OH reaching Relieving factors: rest  PRECAUTIONS: None  RED FLAGS: None     WEIGHT BEARING RESTRICTIONS: No  FALLS:  Has patient fallen in last 6 months? No  OCCUPATION: bus driver  PLOF: Independent  PATIENT GOALS: To manage my neck pain  NEXT MD VISIT: 11/24  OBJECTIVE:  Note: Objective measures were completed at Evaluation unless otherwise noted.  DIAGNOSTIC FINDINGS:  None available  PATIENT SURVEYS:  FOTO 43(53 predicted)  03/18/23 63  POSTURE:  elevated R shoulder  PALPATION: TTP R SCM, scalenes, UT and infraspinatus    CERVICAL ROM:   Active ROM A/PROM (deg) eval 03/10/23 03/18/23  Flexion 50% 50% 50%  Extension 50% 50% 90%  Right lateral flexion 90% 100% 100%  Left lateral flexion 25% 75% 75%  Right rotation 75% 90% 90%  Left rotation 50% 75% 90%   (Blank rows = not tested)  UPPER EXTREMITY ROM:  Active ROM Right eval Left eval 03/10/23 03/18/23  Shoulder flexion 160 170 160d 160d  Shoulder extension      Shoulder abduction 150 170 160d 160d  Shoulder adduction      Shoulder extension      Shoulder internal rotation [facility] WFL    Shoulder external rotation [facility]  WFL    Elbow flexion      Elbow extension      Wrist flexion      Wrist extension      Wrist ulnar deviation      Wrist radial deviation      Wrist pronation      Wrist supination       (Blank rows = not tested)  UPPER EXTREMITY MMT: WFL throughout  MMT Right eval Left eval  Shoulder flexion    Shoulder extension    Shoulder abduction    Shoulder adduction    Shoulder extension    Shoulder internal rotation    Shoulder external rotation    Middle trapezius    Lower trapezius    Elbow flexion    Elbow extension    Wrist flexion    Wrist extension    Wrist ulnar deviation    Wrist radial deviation    Wrist pronation    Wrist supination    Grip strength     (Blank rows = not tested)  CERVICAL SPECIAL TESTS:  Neck flexor muscle endurance test: Positive 15s hold due to weakness; 03/10/23 27s  FUNCTIONAL TESTS:  5 times sit to stand: 12s  TODAY'S TREATMENT:    [facility] Adult PT Treatment:                                                DATE: 03/25/23 Therapeutic Exercise: Prone flexion/row/extension/hor abd/scaption 3# Manual Therapy: Skilled palpation to identify taught and irritable bands in R teres Major Trigger Point Dry Needling Treatment: Pre-treatment instruction: Patient instructed on dry needling rationale, procedures, and possible side effects including pain during treatment (achy,cramping feeling), bruising, drop of blood, lightheadedness, nausea, sweating. Patient Consent Given: Yes Education handout provided: No Muscles treated: R Teres major Needle size and number: .25x54mm x 1 Electrical stimulation performed: No Parameters: N/A Treatment response/outcome: Palpable decrease in muscle tension Post-treatment instructions: Patient instructed to expect possible mild to moderate muscle soreness later today and/or tomorrow. Patient instructed in methods to reduce muscle soreness and to continue prescribed HEP. If patient was dry needled over the lung field,  patient was instructed on signs and symptoms of pneumothorax and, however unlikely, to see immediate medical attention should they occur. Patient was also educated on signs and symptoms of infection and to seek medical attention should they occur. Patient verbalized understanding of these instructions and education.    [facility] Adult PT Treatment:                                                DATE: 03/20/23 Therapeutic Exercise: Nustep L4 6 min Omega row 25# high/low grip 15/15 Omega lat pulldown 25# 15x 4 way scapula 15x ea. IR stretch on stabilized scapula. Manual Therapy: Skilled palpation to identify taught and irritable bands in R teres Major Trigger Point Dry Needling Treatment:  Pre-treatment instruction: Patient instructed on dry needling rationale, procedures, and possible side effects including pain during treatment (achy,cramping feeling), bruising, drop of blood, lightheadedness, nausea, sweating. Patient Consent Given: Yes Education handout provided: No Muscles treated: R Teres minor  Needle size and number: .30x68mm x 1 and .25x35mm x 1 Electrical stimulation performed: No Parameters: N/A Treatment response/outcome: Palpable decrease in muscle tension Post-treatment instructions: Patient instructed to expect possible mild to moderate muscle soreness later today and/or tomorrow. Patient instructed in methods to reduce muscle soreness and to continue prescribed HEP. If patient was dry needled over the lung field, patient was instructed on signs and symptoms of pneumothorax and, however unlikely, to see immediate medical attention should they occur. Patient was also educated on signs and symptoms of infection and to seek medical attention should they occur. Patient verbalized understanding of these instructions and education.    [facility] Adult PT Treatment:                                                DATE: 03/18/23 Therapeutic Exercise: Nustep L5 6 min Prone row 15# 2# Prone extension  2# 15x Prone hor abd 2# 15x Prone flexion 2# 15x Prone scaption 2# 15x Re-assess  [facility] Adult PT Treatment:                                                DATE: 03/12/23 Therapeutic Exercise: Nustep L4 6 min Omega row 25# high/low grip 15/15 Omega lat pulldown 25# 15x Scapular wall slides RTB 15x Prone row 15# 2# Prone extension 2# 15x Prone hor abd 2# 15x Prone flexion 2# 15x Prone scaption 2# 15x Joint mobs inf/post/dist 5x10 Rhythmic stabilization 60/90/120 60s hold PNF D1 F/E  [facility] Adult PT Treatment:                                                DATE: 03/10/23 Therapeutic Exercise: Nustep L4 6 min Omega row 20# high/low grip 15/15 Omega lat pulldown 20# 15x Scapular wall slides YTB 15x Prone row 15# 2# Prone extension 2# 15x Prone hor abd 2# 15x Prone flexion 2# 15x Prone scaption 2# 15x                                                                                                                          DATE: 02/04/23 Eval   PATIENT EDUCATION:  Education details: Discussed eval findings, rehab rationale and POC and patient is in agreement  Person educated: Patient Education method: Explanation Education comprehension: verbalized understanding and needs further education  HOME EXERCISE  PROGRAM: Access Code: [email or web address] URL: [email or web address] Date: 03/18/2023 Prepared by: Gustavus Bryant  Exercises - Shoulder External Rotation and Scapular Retraction with Resistance  - 2 x daily - 5 x weekly - 2 sets - 15 reps - Standing Shoulder Horizontal Abduction with Resistance  - 2 x daily - 5 x weekly - 2 sets - 15 reps - Scaption Wall Slide with Towel  - 2 x daily - 5 x weekly - 2 sets - 15 reps - Standing Shoulder Scaption with Resistance  - 2 x daily - 5 x weekly - 2 sets - 15 reps - Sternocleidomastoid Stretch  - 2 x daily - 5 x weekly - 1 sets - 2 reps - 30s hold  ASSESSMENT:  CLINICAL IMPRESSION: Continued TPDN to posterior R shoulder Teres  Major.  TP size diminished since last session and more difficult to locate.  Follwed technique by aerobic work and posterior shoulder strengthening increasing resistance to 3#.   OBJECTIVE IMPAIRMENTS: decreased activity tolerance, decreased knowledge of condition, decreased mobility, decreased ROM, decreased strength, impaired UE functional use, postural dysfunction, and pain.   ACTIVITY LIMITATIONS: carrying, lifting, and reach over head  PERSONAL FACTORS: Fitness, Time since onset of injury/illness/exacerbation, and 1 comorbidity: R RC pathology  are also affecting patient's functional outcome.   REHAB POTENTIAL: Good  CLINICAL DECISION MAKING: Stable/uncomplicated  EVALUATION COMPLEXITY: Low   GOALS: Goals reviewed with patient? No  SHORT TERM GOALS: Target date: 03/18/2023  Patient to demonstrate independence in HEP  Baseline:  [id number] Goal status: Met  2.  Increase L lateral flexion to 50% Baseline:  Active ROM A/PROM (deg) eval 03/10/23 03/18/23  Flexion 50% 50% 50%  Extension 50% 50% 90%  Right lateral flexion 90% 100% 100%  Left lateral flexion 25% 75% 75%  Right rotation 75% 90% 90%  Left rotation 50% 75% 90%   Goal status: Met   LONG TERM GOALS: Target date: 03/18/2023  Patient will score at least 53% on FOTO to signify clinically meaningful improvement in functional abilities.   Baseline: 43%; 03/18/23 63% Goal status: Met  2.  Increase cervical ROM to 75% throughout Baseline:  Active ROM A/PROM (deg) eval 03/10/23  Flexion 50% 50%  Extension 50% 50%  Right lateral flexion 90% 100%  Left lateral flexion 25% 75%  Right rotation 75% 90%  Left rotation 50% 75%   Goal status: Ongoing  3.  Decrease worst pain to 6/10 Baseline: 8/10; 03/18/23 5/10 Goal status: Met  4.  Decrease tenderness in R UT, SCM, infraspinatus and scalene groups to minimal Baseline: moderate Goal status: Ongoing   PLAN:  PT FREQUENCY: 1x/week  PT DURATION: 5  weeks  PLANNED INTERVENTIONS: Therapeutic exercises, Therapeutic activity, Neuromuscular re-education, Balance training, Gait training, Patient/Family education, Self Care, Joint mobilization, Dry Needling, Electrical stimulation, Spinal mobilization, Cryotherapy, Moist heat, Manual therapy, and Re-evaluation  PLAN FOR NEXT SESSION: HEP review and update, manual techniques as appropriate, aerobic tasks, ROM and flexibility activities, strengthening and PREs, TPDN, gait and balance training as needed   For all possible CPT codes, reference the Planned Interventions line above.     Check all conditions that are expected to impact treatment: {Conditions expected to impact treatment:Complications related to surgery   If treatment provided at initial evaluation, no treatment charged due to lack of authorization.       Hildred Laser, PT 03/25/2023, 6:29 PM

## 2023-03-27 ENCOUNTER — Ambulatory Visit: Payer: Medicaid Other | Admitting: Dietician

## 2023-03-28 DIAGNOSIS — M5412 Radiculopathy, cervical region: Secondary | ICD-10-CM | POA: Diagnosis not present

## 2023-03-28 DIAGNOSIS — M545 Low back pain, unspecified: Secondary | ICD-10-CM | POA: Diagnosis not present

## 2023-03-28 DIAGNOSIS — M542 Cervicalgia: Secondary | ICD-10-CM | POA: Diagnosis not present

## 2023-03-28 DIAGNOSIS — R202 Paresthesia of skin: Secondary | ICD-10-CM | POA: Diagnosis not present

## 2023-03-31 DIAGNOSIS — F330 Major depressive disorder, recurrent, mild: Secondary | ICD-10-CM | POA: Diagnosis not present

## 2023-03-31 DIAGNOSIS — F411 Generalized anxiety disorder: Secondary | ICD-10-CM | POA: Diagnosis not present

## 2023-04-01 ENCOUNTER — Encounter: Payer: Medicaid Other | Attending: General Surgery | Admitting: Dietician

## 2023-04-01 ENCOUNTER — Encounter: Payer: Self-pay | Admitting: Dietician

## 2023-04-01 ENCOUNTER — Ambulatory Visit: Payer: Medicaid Other

## 2023-04-01 VITALS — Ht 64.0 in | Wt 185.1 lb

## 2023-04-01 DIAGNOSIS — E669 Obesity, unspecified: Secondary | ICD-10-CM | POA: Diagnosis not present

## 2023-04-01 DIAGNOSIS — R293 Abnormal posture: Secondary | ICD-10-CM

## 2023-04-01 DIAGNOSIS — M6281 Muscle weakness (generalized): Secondary | ICD-10-CM

## 2023-04-01 DIAGNOSIS — M542 Cervicalgia: Secondary | ICD-10-CM

## 2023-04-01 NOTE — Therapy (Signed)
OUTPATIENT PHYSICAL THERAPY TREATMENT NOTE   Patient Name: [name] MRN: [id number] DOB:November 30, 1975, 47 y.o., female Today's Date: 04/01/2023  END OF SESSION:  PT End of Session - 04/01/23 1533     Visit Number 10    Number of Visits 12    Date for PT Re-Evaluation 04/06/23    Authorization Type MCD    Authorization Time Period 7 visits approved 02/17/23-04/17/23    Authorization - Number of Visits 7    PT Start Time 1534   arrived late   PT Stop Time 1613    PT Time Calculation (min) 39 min    Activity Tolerance Patient tolerated treatment well;Patient limited by pain    Behavior During Therapy [facility] for tasks assessed/performed             Past Medical History:  Diagnosis Date   Allergy    Anemia    Anxiety    Apnea 12/08/2019   Arthritis    Asthma    Back pain    Constipation    Exertional dyspnea 03/14/2020   Fatty liver    GERD (gastroesophageal reflux disease)    H/O: hysterectomy    Hx of endometriosis    Inappropriate sinus tachycardia (HCC) 12/08/2019   Insomnia    Joint pain    Lactose intolerance    Migraine    Morbid obesity (HCC) 12/08/2019   Osteoporosis    Pneumonia    Pre-diabetes    Urticaria    Vitamin D deficiency    Past Surgical History:  Procedure Laterality Date   32 HOUR PH STUDY N/A 01/12/2018   Procedure: 24 HOUR PH STUDY;  Surgeon: Napoleon Form, MD;  Location: [facility];  Service: Endoscopy;  Laterality: N/A;   ABDOMINAL HYSTERECTOMY  12/12/2003   ADENOIDECTOMY  05/2011   ANKLE ARTHROSCOPY  12/29/2007   right; with extensive debridement   BLADDER NECK RECONSTRUCTION  01/14/2011   Procedure: BLADDER NECK REPAIR;  Surgeon: Reva Bores, MD;  Location: [facility];  Service: Gynecology;  Laterality: N/A;  Laparoscopic Repair of Incidental Cystotomy   BREAST REDUCTION SURGERY  08/05/2011   Procedure: MAMMARY REDUCTION  (BREAST);  Surgeon: Louisa Second, MD;  Location: [facility];  Service: Plastics;   Laterality: Bilateral;  bilateral   CESAREAN SECTION  12/29/2000; 1994   CHOLECYSTECTOMY     age 12   DILATION AND CURETTAGE OF UTERUS  07/08/2003   open laparoscopy   ESOPHAGEAL MANOMETRY N/A 01/12/2018   Procedure: ESOPHAGEAL MANOMETRY (EM);  Surgeon: Napoleon Form, MD;  Location: [facility];  Service: Endoscopy;  Laterality: N/A;   GASTRIC ROUX-EN-Y N/A 03/19/2022   Procedure: LAPAROSCOPIC ROUX-EN-Y GASTRIC BYPASS WITH UPPER ENDOSCOPY;  Surgeon: Gaynelle Adu, MD;  Location: [facility];  Service: General;  Laterality: N/A;   GIVENS CAPSULE STUDY N/A 06/23/2017   Procedure: GIVENS CAPSULE STUDY;  Surgeon: Benancio Deeds, MD;  Location: [facility];  Service: Gastroenterology;  Laterality: N/A;   HIATAL HERNIA REPAIR N/A 03/19/2022   Procedure: HERNIA REPAIR HIATAL;  Surgeon: Gaynelle Adu, MD;  Location: [facility];  Service: General;  Laterality: N/A;   LAPAROSCOPIC SALPINGOOPHERECTOMY  01/14/2011   left   LAPAROSCOPY N/A 11/01/2022   Procedure: LAPAROSCOPY DIAGNOSTIC; LAPARSCOPIC CLOSURE OF PETERSEN SPACE;  Surgeon: Gaynelle Adu, MD;  Location: [facility];  Service: General;  Laterality: N/A;  DR Gerrit Friends TO ASSIST   PH IMPEDANCE STUDY N/A 01/12/2018   Procedure: PH IMPEDANCE STUDY;  Surgeon: Napoleon Form, MD;  Location: [facility];  Service: Endoscopy;  Laterality: N/A;   RESECTION DISTAL CLAVICAL Right 09/25/2017   Procedure: RESECTION DISTAL CLAVICAL;  Surgeon: Bjorn Pippin, MD;  Location: [facility];  Service: Orthopedics;  Laterality: Right;   RIGHT OOPHORECTOMY  2011   with lysis of adhesions   SHOULDER ACROMIOPLASTY Right 09/25/2017   Procedure: SHOULDER ACROMIOPLASTY;  Surgeon: Bjorn Pippin, MD;  Location: [facility];  Service: Orthopedics;  Laterality: Right;   SHOULDER ARTHROSCOPY WITH DEBRIDEMENT AND BICEP TENDON REPAIR Right 09/25/2017   Procedure: SHOULDER ARTHROSCOPY WITH DEBRIDEMENT AND BICEP TENDON REPAIR;  Surgeon: Bjorn Pippin,  MD;  Location: [facility];  Service: Orthopedics;  Laterality: Right;   SHOULDER ARTHROSCOPY WITH ROTATOR CUFF REPAIR Right 09/25/2017   Procedure: SHOULDER ARTHROSCOPY WITH ROTATOR CUFF REPAIR;  Surgeon: Bjorn Pippin, MD;  Location: [facility];  Service: Orthopedics;  Laterality: Right;   TONSILLECTOMY  as a child   TUBAL LIGATION  12/29/2000   UPPER GASTROINTESTINAL ENDOSCOPY     UPPER GI ENDOSCOPY N/A 11/01/2022   Procedure: UPPER GI ENDOSCOPY;  Surgeon: Gaynelle Adu, MD;  Location: [facility];  Service: General;  Laterality: N/A;   Patient Active Problem List   Diagnosis Date Noted   Class 1 obesity due to excess calories with serious comorbidity and body mass index (BMI) of 33.0 to 33.9 in adult 11/05/2022   COVID-19 vaccination declined 11/05/2022   Abdominal pain 08/21/2022   Anemia of pregnancy 08/21/2022   Chronic pelvic pain in female 08/21/2022   History of cesarean section 08/21/2022   Irregular intermenstrual bleeding 08/21/2022   S/P gastric bypass 03/19/2022   Microcytic anemia 12/14/2021   Iron deficiency anemia 12/14/2021   Adverse food reaction 03/21/2021   Depressive disorder 01/10/2021   Osteoporosis 01/10/2021   Thyroid nodule 01/10/2021   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   Ascites 10/05/2020   Muscle tension dysphonia 08/10/2020   Other insomnia 07/27/2020   At risk for side effect of medication 07/27/2020   Other hyperlipidemia 05/15/2020   At risk for osteoporosis 05/15/2020   At risk for impaired metabolic function 04/04/2020   Other fatigue 03/22/2020   SOBOE (shortness of breath on exertion) 03/22/2020   Exertional dyspnea 03/14/2020   Cyst of ovary 03/11/2020   Endometriosis 03/11/2020   Herpes simplex type 2 infection 03/11/2020   Neuropathy, right side of face 03/08/2020   At risk for depression 03/08/2020   Primary osteoarthritis of both feet 02/26/2020   Hyperlipidemia 01/26/2020   At risk for heart disease 01/26/2020    Daytime somnolence 01/26/2020   Abnormal glucose 01/26/2020   Vitamin D deficiency 01/04/2020   Fatty liver 01/04/2020   B12 deficiency 01/04/2020   Moderate persistent asthma without complication 12/31/2019   Inappropriate sinus tachycardia (HCC) 12/08/2019   Apnea 12/08/2019   Anaphylactic reaction due to food, subsequent encounter 11/22/2019   Idiopathic urticaria 11/22/2019   Generalized anxiety disorder 04/08/2018   Atypical chest pain    Regurgitation of food    Laryngopharyngeal reflux (LPR) 07/30/2017   ANA positive 12/30/2016   Gastroesophageal reflux disease 06/27/2016   Disturbance of skin sensation 04/27/2014   Dizziness and giddiness 03/14/2014   Headache, migraine 03/14/2014   Myalgia and myositis, unspecified 06/07/2013   Nausea and vomiting in adult 08/22/2012   Pelvic pain in female 12/18/2010   ANEMIA-IRON DEFICIENCY 10/15/2006   Genital herpes 09/30/2006   Obesity (BMI 35.0-39.9 without comorbidity) 09/30/2006   Nonorganic sleep disorder  09/30/2006   Constipation 09/30/2006   NEURITIS, LUMBOSACRAL NOS 09/30/2006   Personal history of mental disorder 09/30/2006    PCP: [name], FNP   REFERRING PROVIDER: Teryl Lucy, MD  REFERRING DIAG: CERVICALGIA WITH RADICULOPATHY  THERAPY DIAG:  Cervicalgia  Muscle weakness (generalized)  Abnormal posture  Rationale for Evaluation and Treatment: Rehabilitation  ONSET DATE: chronic  SUBJECTIVE:                                                                                                                                                                                                         SUBJECTIVE STATEMENT: Feels she has more mobility in reaching following last session and TPDN  PERTINENT HISTORY:  HPI patient is a pleasant 47 year old female who comes in today with continued right shoulder and neck pain.  We have seen her several times for this.  She has had 2 previous right shoulder surgeries  without significant relief of symptoms.  She has had subsequent subacromial and glenohumeral cortisone injections without much relief.  MRI of the cervical spine and shoulder were obtained back in August of this year.  Neck MRI showed minimal cervical spine changes without any disc bulge or spinal canal or neural foraminal narrowing.  Right shoulder MRI showed mild supraspinatus tendinopathy.  No other findings.  She has been seeing Dr. Titus Dubin as she is previously had a positive ANA with sicca symptoms.  She is currently being referred to [facility] rheumatology for further work-up.   Review of Systems as detailed in HPI.  All others reviewed and are negative.  PAIN:  Are you having pain? Yes: NPRS scale: 5/10 Pain location: R shoulder/neck Pain description: ache Aggravating factors: activity, OH reaching Relieving factors: rest  PRECAUTIONS: None  RED FLAGS: None     WEIGHT BEARING RESTRICTIONS: No  FALLS:  Has patient fallen in last 6 months? No  OCCUPATION: bus driver  PLOF: Independent  PATIENT GOALS: To manage my neck pain  NEXT MD VISIT: 11/24  OBJECTIVE:  Note: Objective measures were completed at Evaluation unless otherwise noted.  DIAGNOSTIC FINDINGS:  None available  PATIENT SURVEYS:  FOTO 43(53 predicted)  03/18/23 63  POSTURE:  elevated R shoulder  PALPATION: TTP R SCM, scalenes, UT and infraspinatus    CERVICAL ROM:   Active ROM A/PROM (deg) eval 03/10/23 03/18/23  Flexion 50% 50% 50%  Extension 50% 50% 90%  Right lateral flexion 90% 100% 100%  Left lateral flexion 25% 75% 75%  Right rotation 75% 90% 90%  Left rotation 50% 75% 90%   (Blank rows =  not tested)  UPPER EXTREMITY ROM:  Active ROM Right eval Left eval 03/10/23 03/18/23  Shoulder flexion 160 170 160d 160d  Shoulder extension      Shoulder abduction 150 170 160d 160d  Shoulder adduction      Shoulder extension      Shoulder internal rotation [facility] WFL    Shoulder external rotation [facility]  WFL    Elbow flexion      Elbow extension      Wrist flexion      Wrist extension      Wrist ulnar deviation      Wrist radial deviation      Wrist pronation      Wrist supination       (Blank rows = not tested)  UPPER EXTREMITY MMT: WFL throughout  MMT Right eval Left eval  Shoulder flexion    Shoulder extension    Shoulder abduction    Shoulder adduction    Shoulder extension    Shoulder internal rotation    Shoulder external rotation    Middle trapezius    Lower trapezius    Elbow flexion    Elbow extension    Wrist flexion    Wrist extension    Wrist ulnar deviation    Wrist radial deviation    Wrist pronation    Wrist supination    Grip strength     (Blank rows = not tested)  CERVICAL SPECIAL TESTS:  Neck flexor muscle endurance test: Positive 15s hold due to weakness; 03/10/23 27s  FUNCTIONAL TESTS:  5 times sit to stand: 12s  TODAY'S TREATMENT:    [facility] Adult PT Treatment:                                                DATE: 04/01/23 Therapeutic Exercise: UBE lvl 1.0 x 3 min while taking subjective Seated low row 2x10 25# Seated lat pulldown 2x10 25# Supine horizontal abd 2x15 GTB Supine serratus raise 2x10 YTB Prone row 2x10 3# each Prone ext 2x10 3# each Manual Therapy: Skilled palpation to identify taught and irritable bands in R teres Major, R upper trap, R posterior deltoid Positional release to R upper trap Suboccipital release Trigger Point Dry Needling Treatment: Pre-treatment instruction: Patient instructed on dry needling rationale, procedures, and possible side effects including pain during treatment (achy,cramping feeling), bruising, drop of blood, lightheadedness, nausea, sweating. Patient Consent Given: Yes Education handout provided: No Muscles treated: R posterior deltoid, R upper trap Needle size and number: .30x76mm x 2 Electrical stimulation performed: No Parameters: N/A Treatment response/outcome: Palpable decrease in muscle  tension Post-treatment instructions: Patient instructed to expect possible mild to moderate muscle soreness later today and/or tomorrow. Patient instructed in methods to reduce muscle soreness and to continue prescribed HEP. If patient was dry needled over the lung field, patient was instructed on signs and symptoms of pneumothorax and, however unlikely, to see immediate medical attention should they occur. Patient was also educated on signs and symptoms of infection and to seek medical attention should they occur. Patient verbalized understanding of these instructions and education.    [facility] Adult PT Treatment:  DATE: 03/25/23 Therapeutic Exercise: Prone flexion/row/extension/hor abd/scaption 3# Manual Therapy: Skilled palpation to identify taught and irritable bands in R teres Major Trigger Point Dry Needling Treatment: Pre-treatment instruction: Patient instructed on dry needling rationale, procedures, and possible side effects including pain during treatment (achy,cramping feeling), bruising, drop of blood, lightheadedness, nausea, sweating. Patient Consent Given: Yes Education handout provided: No Muscles treated: R Teres major Needle size and number: .25x78mm x 1 Electrical stimulation performed: No Parameters: N/A Treatment response/outcome: Palpable decrease in muscle tension Post-treatment instructions: Patient instructed to expect possible mild to moderate muscle soreness later today and/or tomorrow. Patient instructed in methods to reduce muscle soreness and to continue prescribed HEP. If patient was dry needled over the lung field, patient was instructed on signs and symptoms of pneumothorax and, however unlikely, to see immediate medical attention should they occur. Patient was also educated on signs and symptoms of infection and to seek medical attention should they occur. Patient verbalized understanding of these instructions and education.     [facility] Adult PT Treatment:                                                DATE: 03/20/23 Therapeutic Exercise: Nustep L4 6 min Omega row 25# high/low grip 15/15 Omega lat pulldown 25# 15x 4 way scapula 15x ea. IR stretch on stabilized scapula. Manual Therapy: Skilled palpation to identify taught and irritable bands in R teres Major Trigger Point Dry Needling Treatment: Pre-treatment instruction: Patient instructed on dry needling rationale, procedures, and possible side effects including pain during treatment (achy,cramping feeling), bruising, drop of blood, lightheadedness, nausea, sweating. Patient Consent Given: Yes Education handout provided: No Muscles treated: R Teres minor  Needle size and number: .30x10mm x 1 and .25x21mm x 1 Electrical stimulation performed: No Parameters: N/A Treatment response/outcome: Palpable decrease in muscle tension Post-treatment instructions: Patient instructed to expect possible mild to moderate muscle soreness later today and/or tomorrow. Patient instructed in methods to reduce muscle soreness and to continue prescribed HEP. If patient was dry needled over the lung field, patient was instructed on signs and symptoms of pneumothorax and, however unlikely, to see immediate medical attention should they occur. Patient was also educated on signs and symptoms of infection and to seek medical attention should they occur. Patient verbalized understanding of these instructions and education.    PATIENT EDUCATION:  Education details: continue HEP Person educated: Patient Education method: Explanation Education comprehension: verbalized understanding and needs further education  HOME EXERCISE PROGRAM: Access Code: [email or web address] URL: [email or web address] Date: 03/18/2023 Prepared by: Gustavus Bryant  Exercises - Shoulder External Rotation and Scapular Retraction with Resistance  - 2 x daily - 5 x weekly - 2 sets - 15 reps - Standing Shoulder  Horizontal Abduction with Resistance  - 2 x daily - 5 x weekly - 2 sets - 15 reps - Scaption Wall Slide with Towel  - 2 x daily - 5 x weekly - 2 sets - 15 reps - Standing Shoulder Scaption with Resistance  - 2 x daily - 5 x weekly - 2 sets - 15 reps - Sternocleidomastoid Stretch  - 2 x daily - 5 x weekly - 1 sets - 2 reps - 30s hold  ASSESSMENT:  CLINICAL IMPRESSION:  Pt was able to complete all prescribed exercises with no adverse effect. Therapy today focused on periscapular and posterior shoulder strength for  decreasing pain. Responded well to TPDN, noting decrease in pain post session. Pt continues to benefit from skilled PT, will continue per POC.   OBJECTIVE IMPAIRMENTS: decreased activity tolerance, decreased knowledge of condition, decreased mobility, decreased ROM, decreased strength, impaired UE functional use, postural dysfunction, and pain.   ACTIVITY LIMITATIONS: carrying, lifting, and reach over head  PERSONAL FACTORS: Fitness, Time since onset of injury/illness/exacerbation, and 1 comorbidity: R RC pathology  are also affecting patient's functional outcome.   REHAB POTENTIAL: Good  CLINICAL DECISION MAKING: Stable/uncomplicated  EVALUATION COMPLEXITY: Low   GOALS: Goals reviewed with patient? No  SHORT TERM GOALS: Target date: 03/18/2023  Patient to demonstrate independence in HEP  Baseline:  [id number] Goal status: Met  2.  Increase L lateral flexion to 50% Baseline:  Active ROM A/PROM (deg) eval 03/10/23 03/18/23  Flexion 50% 50% 50%  Extension 50% 50% 90%  Right lateral flexion 90% 100% 100%  Left lateral flexion 25% 75% 75%  Right rotation 75% 90% 90%  Left rotation 50% 75% 90%   Goal status: Met   LONG TERM GOALS: Target date: 03/18/2023  Patient will score at least 53% on FOTO to signify clinically meaningful improvement in functional abilities.   Baseline: 43%; 03/18/23 63% Goal status: Met  2.  Increase cervical ROM to 75% throughout Baseline:   Active ROM A/PROM (deg) eval 03/10/23  Flexion 50% 50%  Extension 50% 50%  Right lateral flexion 90% 100%  Left lateral flexion 25% 75%  Right rotation 75% 90%  Left rotation 50% 75%   Goal status: Ongoing  3.  Decrease worst pain to 6/10 Baseline: 8/10; 03/18/23 5/10 Goal status: Met  4.  Decrease tenderness in R UT, SCM, infraspinatus and scalene groups to minimal Baseline: moderate Goal status: Ongoing   PLAN:  PT FREQUENCY: 1x/week  PT DURATION: 5 weeks  PLANNED INTERVENTIONS: Therapeutic exercises, Therapeutic activity, Neuromuscular re-education, Balance training, Gait training, Patient/Family education, Self Care, Joint mobilization, Dry Needling, Electrical stimulation, Spinal mobilization, Cryotherapy, Moist heat, Manual therapy, and Re-evaluation  PLAN FOR NEXT SESSION: HEP review and update, manual techniques as appropriate, aerobic tasks, ROM and flexibility activities, strengthening and PREs, TPDN, gait and balance training as needed   For all possible CPT codes, reference the Planned Interventions line above.     Check all conditions that are expected to impact treatment: {Conditions expected to impact treatment:Complications related to surgery   If treatment provided at initial evaluation, no treatment charged due to lack of authorization.       Eloy End, PT 04/01/2023, 4:33 PM

## 2023-04-01 NOTE — Progress Notes (Signed)
Bariatric Nutrition Follow-Up Visit Medical Nutrition Therapy   Surgery date: 03/19/22 Surgery type: RYGB  NUTRITION ASSESSMENT  Anthropometrics  Start weight at [facility]: 241.4 lbs (date: 11/22/2021)  Height: 64 in Weight today: 185.1 lbs.  Clinical  Medical hx: Allergies, Anemia, Anxiety, arthritis, asthma, GERD, kidney disease, osteoporosis Medications: Esomeprazole, one a day vitamin, vit D, Calcium  Labs: Hemoglobin 9.9; Hematocrit 32.0; Vit D 110.0; B12 2,200; A1c 5.5 Notable signs/symptoms: nothing noted Any previous deficiencies? No Bowel Habits: Every day to every other day no complaints   Body Composition Scale 04/02/22 05/21/2022 08/20/2022 11/11/2022 04/01/2023  Current Body Weight 228.0 215.1 198.6 189.3 185.1  Total Body Fat % 43.4 41.5 39.4 37.9 37.2  Visceral Fat 14 13 11 10 10   Fat-Free Mass % 56.5 58.4 60.5 62.0 62.7   Total Body Water % 42.7 43.7 44.7 45.5 45.8  Muscle-Mass lbs 31.1 31.3 31.0 30.9 30.9  BMI 39.0 36.8 33.9 32.3 31.6  Body Fat Displacement              Torso  lbs 61.3 55.3 48.4 44.4 42.6         Left Leg  lbs 12.2 11.0 9.6 8.8 8.5         Right Leg  lbs 12.2 11.0 9.6 8.8 8.5         Left Arm  lbs 6.1 5.5 4.8 4.4 4.2         Right Arm   lbs 6.1 5.5 4.8 4.4 4.2     Lifestyle & Dietary Hx  Pt states she can do better with physical activity. Pt states she is scheduled to see the surgeon in February, stating she is on a cancellation list for an earlier appointment. Pt states she is interested in the BELT program since it has changed times.  Estimated daily fluid intake: 64+ oz Estimated daily protein intake: 60-70 g Supplements: multivitamin and calcium Current average weekly physical activity: walking 2 times a week for 30 minutes.  24-Hr Dietary Recall First Meal: protein shake Snack: cheese stick  Second Meal: tuna fish or salmon patty or cheese stick or peanut butter or chicken salad Snack: quest protein chips, fruit Third Meal:  chicken or salmon or tuna, broccoli or corn Snack: sf pop cycle or coffee with protein shake Beverages: water, coffee, vitamin water zero  Post-Op Goals/ Signs/ Symptoms Using straws: no Drinking while eating: no Chewing/swallowing difficulties: no Changes in vision: no Changes to mood/headaches: no Hair loss/changes to skin/nails: no Difficulty focusing/concentrating: no Sweating: no Limb weakness: no Dizziness/lightheadedness: no Palpitations: no  Carbonated/caffeinated beverages: no N/V/D/C/Gas: no Abdominal pain: no Dumping syndrome: no   NUTRITION DIAGNOSIS  Overweight/obesity (Tiro-3.3) related to past poor dietary habits and physical inactivity as evidenced by completed bariatric surgery and following dietary guidelines for continued weight loss and healthy nutrition status.  Why you need complex carbohydrates: complex carbohydrates are required to have a healthy diet. Whole grains provide fiber which can help with blood glucose levels and help keep you satiated. Fruits and starchy and non-starch vegetables provide essential vitamins and minerals required for immune function, eyesight support, brain support, bone density, wound healing and many other functions within the body. According to the current evidenced based 2020-2025 Dietary Guidelines for Americans, complex carbohydrates are part of a healthy eating pattern which is associated with a decreased risk for type 2 diabetes, cancers, and cardiovascular disease.   NUTRITION INTERVENTION Nutrition counseling (C-1) and education (E-2) to facilitate bariatric surgery goals, including: The importance  of consuming adequate calories as well as certain nutrients daily due to the body's need for essential vitamins, minerals, and fats The importance of daily physical activity and to reach a goal of at least 150 minutes of moderate to vigorous physical activity weekly (or as directed by their physician) due to benefits such as increased  musculature and improved lab values The importance of intuitive eating specifically learning hunger-satiety cues and understanding the importance of learning a new body: The importance of mindful eating to avoid grazing behaviors   Goals - Continue: take multivitamin (with iron) and calcium - Continue: aim for at least 2 servings of non-starchy vegetables a day. - Re-engage: increase physical activity; Check out BELT.  Handouts Provided Include  Maintenance Plan (1 Year) MyPlate  Learning Style & Readiness for Change Teaching method utilized: Visual & Auditory  Demonstrated degree of understanding via: Teach Back  Readiness Level: Preparation Barriers to learning/adherence to lifestyle change: nothing identified  RD's Notes for Next Visit Assess adherence to pt chosen goals  MONITORING & EVALUATION Dietary intake, weekly physical activity, body weight.  Next Steps Patient is to follow-up in 6 months.

## 2023-04-10 ENCOUNTER — Ambulatory Visit: Payer: Medicaid Other | Attending: Orthopedic Surgery

## 2023-04-10 DIAGNOSIS — R293 Abnormal posture: Secondary | ICD-10-CM | POA: Insufficient documentation

## 2023-04-10 DIAGNOSIS — M6281 Muscle weakness (generalized): Secondary | ICD-10-CM | POA: Insufficient documentation

## 2023-04-10 DIAGNOSIS — M542 Cervicalgia: Secondary | ICD-10-CM | POA: Insufficient documentation

## 2023-04-10 NOTE — Therapy (Signed)
OUTPATIENT PHYSICAL THERAPY TREATMENT NOTE   Patient Name: [name] MRN: [id number] DOB:April 08, 1976, 47 y.o., female Today's Date: 04/11/2023  END OF SESSION:  PT End of Session - 04/10/23 1534     Visit Number 11    Number of Visits 12    Date for PT Re-Evaluation 04/06/23    Authorization Type MCD    Authorization Time Period 7 visits approved 02/17/23-04/17/23    Authorization - Number of Visits 7    PT Start Time 1532    PT Stop Time 1610    PT Time Calculation (min) 38 min    Activity Tolerance Patient tolerated treatment well;Patient limited by pain    Behavior During Therapy [facility] for tasks assessed/performed             Past Medical History:  Diagnosis Date   Allergy    Anemia    Anxiety    Apnea 12/08/2019   Arthritis    Asthma    Back pain    Constipation    Exertional dyspnea 03/14/2020   Fatty liver    GERD (gastroesophageal reflux disease)    H/O: hysterectomy    Hx of endometriosis    Inappropriate sinus tachycardia (HCC) 12/08/2019   Insomnia    Joint pain    Lactose intolerance    Migraine    Morbid obesity (HCC) 12/08/2019   Osteoporosis    Pneumonia    Pre-diabetes    Urticaria    Vitamin D deficiency    Past Surgical History:  Procedure Laterality Date   48 HOUR PH STUDY N/A 01/12/2018   Procedure: 24 HOUR PH STUDY;  Surgeon: Napoleon Form, MD;  Location: [facility];  Service: Endoscopy;  Laterality: N/A;   ABDOMINAL HYSTERECTOMY  12/12/2003   ADENOIDECTOMY  05/2011   ANKLE ARTHROSCOPY  12/29/2007   right; with extensive debridement   BLADDER NECK RECONSTRUCTION  01/14/2011   Procedure: BLADDER NECK REPAIR;  Surgeon: Reva Bores, MD;  Location: [facility];  Service: Gynecology;  Laterality: N/A;  Laparoscopic Repair of Incidental Cystotomy   BREAST REDUCTION SURGERY  08/05/2011   Procedure: MAMMARY REDUCTION  (BREAST);  Surgeon: Louisa Second, MD;  Location:  SURGERY CENTER;  Service: Plastics;  Laterality:  Bilateral;  bilateral   CESAREAN SECTION  12/29/2000; 1994   CHOLECYSTECTOMY     age 59   DILATION AND CURETTAGE OF UTERUS  07/08/2003   open laparoscopy   ESOPHAGEAL MANOMETRY N/A 01/12/2018   Procedure: ESOPHAGEAL MANOMETRY (EM);  Surgeon: Napoleon Form, MD;  Location: [facility];  Service: Endoscopy;  Laterality: N/A;   GASTRIC ROUX-EN-Y N/A 03/19/2022   Procedure: LAPAROSCOPIC ROUX-EN-Y GASTRIC BYPASS WITH UPPER ENDOSCOPY;  Surgeon: Gaynelle Adu, MD;  Location: [facility];  Service: General;  Laterality: N/A;   GIVENS CAPSULE STUDY N/A 06/23/2017   Procedure: GIVENS CAPSULE STUDY;  Surgeon: Benancio Deeds, MD;  Location: [facility];  Service: Gastroenterology;  Laterality: N/A;   HIATAL HERNIA REPAIR N/A 03/19/2022   Procedure: HERNIA REPAIR HIATAL;  Surgeon: Gaynelle Adu, MD;  Location: [facility];  Service: General;  Laterality: N/A;   LAPAROSCOPIC SALPINGOOPHERECTOMY  01/14/2011   left   LAPAROSCOPY N/A 11/01/2022   Procedure: LAPAROSCOPY DIAGNOSTIC; LAPARSCOPIC CLOSURE OF PETERSEN SPACE;  Surgeon: Gaynelle Adu, MD;  Location: [facility];  Service: General;  Laterality: N/A;  DR Gerrit Friends TO ASSIST   PH IMPEDANCE STUDY N/A 01/12/2018   Procedure: PH IMPEDANCE STUDY;  Surgeon: Napoleon Form, MD;  Location: [facility];  Service: Endoscopy;  Laterality: N/A;   RESECTION DISTAL CLAVICAL Right 09/25/2017   Procedure: RESECTION DISTAL CLAVICAL;  Surgeon: Bjorn Pippin, MD;  Location: [facility];  Service: Orthopedics;  Laterality: Right;   RIGHT OOPHORECTOMY  2011   with lysis of adhesions   SHOULDER ACROMIOPLASTY Right 09/25/2017   Procedure: SHOULDER ACROMIOPLASTY;  Surgeon: Bjorn Pippin, MD;  Location: [facility];  Service: Orthopedics;  Laterality: Right;   SHOULDER ARTHROSCOPY WITH DEBRIDEMENT AND BICEP TENDON REPAIR Right 09/25/2017   Procedure: SHOULDER ARTHROSCOPY WITH DEBRIDEMENT AND BICEP TENDON REPAIR;  Surgeon: Bjorn Pippin, MD;   Location: [facility];  Service: Orthopedics;  Laterality: Right;   SHOULDER ARTHROSCOPY WITH ROTATOR CUFF REPAIR Right 09/25/2017   Procedure: SHOULDER ARTHROSCOPY WITH ROTATOR CUFF REPAIR;  Surgeon: Bjorn Pippin, MD;  Location: [facility];  Service: Orthopedics;  Laterality: Right;   TONSILLECTOMY  as a child   TUBAL LIGATION  12/29/2000   UPPER GASTROINTESTINAL ENDOSCOPY     UPPER GI ENDOSCOPY N/A 11/01/2022   Procedure: UPPER GI ENDOSCOPY;  Surgeon: Gaynelle Adu, MD;  Location: [facility];  Service: General;  Laterality: N/A;   Patient Active Problem List   Diagnosis Date Noted   Class 1 obesity due to excess calories with serious comorbidity and body mass index (BMI) of 33.0 to 33.9 in adult 11/05/2022   COVID-19 vaccination declined 11/05/2022   Abdominal pain 08/21/2022   Anemia of pregnancy 08/21/2022   Chronic pelvic pain in female 08/21/2022   History of cesarean section 08/21/2022   Irregular intermenstrual bleeding 08/21/2022   S/P gastric bypass 03/19/2022   Microcytic anemia 12/14/2021   Iron deficiency anemia 12/14/2021   Adverse food reaction 03/21/2021   Depressive disorder 01/10/2021   Osteoporosis 01/10/2021   Thyroid nodule 01/10/2021   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   Ascites 10/05/2020   Muscle tension dysphonia 08/10/2020   Other insomnia 07/27/2020   At risk for side effect of medication 07/27/2020   Other hyperlipidemia 05/15/2020   At risk for osteoporosis 05/15/2020   At risk for impaired metabolic function 04/04/2020   Other fatigue 03/22/2020   SOBOE (shortness of breath on exertion) 03/22/2020   Exertional dyspnea 03/14/2020   Cyst of ovary 03/11/2020   Endometriosis 03/11/2020   Herpes simplex type 2 infection 03/11/2020   Neuropathy, right side of face 03/08/2020   At risk for depression 03/08/2020   Primary osteoarthritis of both feet 02/26/2020   Hyperlipidemia 01/26/2020   At risk for heart disease 01/26/2020    Daytime somnolence 01/26/2020   Abnormal glucose 01/26/2020   Vitamin D deficiency 01/04/2020   Fatty liver 01/04/2020   B12 deficiency 01/04/2020   Moderate persistent asthma without complication 12/31/2019   Inappropriate sinus tachycardia (HCC) 12/08/2019   Apnea 12/08/2019   Anaphylactic reaction due to food, subsequent encounter 11/22/2019   Idiopathic urticaria 11/22/2019   Generalized anxiety disorder 04/08/2018   Atypical chest pain    Regurgitation of food    Laryngopharyngeal reflux (LPR) 07/30/2017   ANA positive 12/30/2016   Gastroesophageal reflux disease 06/27/2016   Disturbance of skin sensation 04/27/2014   Dizziness and giddiness 03/14/2014   Headache, migraine 03/14/2014   Myalgia and myositis, unspecified 06/07/2013   Nausea and vomiting in adult 08/22/2012   Pelvic pain in female 12/18/2010   ANEMIA-IRON DEFICIENCY 10/15/2006   Genital herpes 09/30/2006   Obesity (BMI 35.0-39.9 without comorbidity) 09/30/2006   Nonorganic sleep disorder 09/30/2006  Constipation 09/30/2006   NEURITIS, LUMBOSACRAL NOS 09/30/2006   Personal history of mental disorder 09/30/2006    PCP: [name], FNP   REFERRING PROVIDER: Teryl Lucy, MD  REFERRING DIAG: CERVICALGIA WITH RADICULOPATHY  THERAPY DIAG:  Cervicalgia  Muscle weakness (generalized)  Rationale for Evaluation and Treatment: Rehabilitation  ONSET DATE: chronic  SUBJECTIVE:                                                                                                                                                                                                         SUBJECTIVE STATEMENT: Feels she has more mobility in reaching following last session and TPDN  PERTINENT HISTORY:  HPI patient is a pleasant 47 year old female who comes in today with continued right shoulder and neck pain.  We have seen her several times for this.  She has had 2 previous right shoulder surgeries without significant  relief of symptoms.  She has had subsequent subacromial and glenohumeral cortisone injections without much relief.  MRI of the cervical spine and shoulder were obtained back in August of this year.  Neck MRI showed minimal cervical spine changes without any disc bulge or spinal canal or neural foraminal narrowing.  Right shoulder MRI showed mild supraspinatus tendinopathy.  No other findings.  She has been seeing Dr. Titus Dubin as she is previously had a positive ANA with sicca symptoms.  She is currently being referred to [facility] rheumatology for further work-up.   Review of Systems as detailed in HPI.  All others reviewed and are negative.  PAIN:  Are you having pain? Yes: NPRS scale: 5/10 Pain location: R shoulder/neck Pain description: ache Aggravating factors: activity, OH reaching Relieving factors: rest  PRECAUTIONS: None  RED FLAGS: None     WEIGHT BEARING RESTRICTIONS: No  FALLS:  Has patient fallen in last 6 months? No  OCCUPATION: bus driver  PLOF: Independent  PATIENT GOALS: To manage my neck pain  NEXT MD VISIT: 11/24  OBJECTIVE:  Note: Objective measures were completed at Evaluation unless otherwise noted.  DIAGNOSTIC FINDINGS:  None available  PATIENT SURVEYS:  FOTO 43(53 predicted)  03/18/23 63  POSTURE:  elevated R shoulder  PALPATION: TTP R SCM, scalenes, UT and infraspinatus    CERVICAL ROM:   Active ROM A/PROM (deg) eval 03/10/23 03/18/23  Flexion 50% 50% 50%  Extension 50% 50% 90%  Right lateral flexion 90% 100% 100%  Left lateral flexion 25% 75% 75%  Right rotation 75% 90% 90%  Left rotation 50% 75% 90%   (Blank rows = not tested)  UPPER EXTREMITY  ROM:  Active ROM Right eval Left eval 03/10/23 03/18/23  Shoulder flexion 160 170 160d 160d  Shoulder extension      Shoulder abduction 150 170 160d 160d  Shoulder adduction      Shoulder extension      Shoulder internal rotation [facility] WFL    Shoulder external rotation [facility] WFL    Elbow flexion       Elbow extension      Wrist flexion      Wrist extension      Wrist ulnar deviation      Wrist radial deviation      Wrist pronation      Wrist supination       (Blank rows = not tested)  UPPER EXTREMITY MMT: WFL throughout  MMT Right eval Left eval  Shoulder flexion    Shoulder extension    Shoulder abduction    Shoulder adduction    Shoulder extension    Shoulder internal rotation    Shoulder external rotation    Middle trapezius    Lower trapezius    Elbow flexion    Elbow extension    Wrist flexion    Wrist extension    Wrist ulnar deviation    Wrist radial deviation    Wrist pronation    Wrist supination    Grip strength     (Blank rows = not tested)  CERVICAL SPECIAL TESTS:  Neck flexor muscle endurance test: Positive 15s hold due to weakness; 03/10/23 27s  FUNCTIONAL TESTS:  5 times sit to stand: 12s  TODAY'S TREATMENT:    [facility] Adult PT Treatment:                                                DATE: 04/10/23 Therapeutic Exercise: UBE lvl 1.0 x 3 min while taking subjective Supine dow flexion 2x10 Supine horizontal abd 2x10 GTB Supine chin tuck 2x10 Supine serratus raise 2x10 YTB Seated bilateral ER 3x10 GTB Corner stretch 2x30"  FM row 2x10 13# FM shoulder ext 2x10 13# Seated low row 2x10 25# Seated lat pulldown 2x10 25#  [facility] Adult PT Treatment:                                                DATE: 04/01/23 Therapeutic Exercise: UBE lvl 1.0 x 3 min while taking subjective Seated low row 2x10 25# Seated lat pulldown 2x10 25# Supine horizontal abd 2x15 GTB Supine serratus raise 2x10 YTB Prone row 2x10 3# each Prone ext 2x10 3# each Manual Therapy: Skilled palpation to identify taught and irritable bands in R teres Major, R upper trap, R posterior deltoid Positional release to R upper trap Suboccipital release Trigger Point Dry Needling Treatment: Pre-treatment instruction: Patient instructed on dry needling rationale, procedures, and  possible side effects including pain during treatment (achy,cramping feeling), bruising, drop of blood, lightheadedness, nausea, sweating. Patient Consent Given: Yes Education handout provided: No Muscles treated: R posterior deltoid, R upper trap Needle size and number: .30x47mm x 2 Electrical stimulation performed: No Parameters: N/A Treatment response/outcome: Palpable decrease in muscle tension Post-treatment instructions: Patient instructed to expect possible mild to moderate muscle soreness later today and/or tomorrow. Patient instructed in methods to reduce muscle soreness and  to continue prescribed HEP. If patient was dry needled over the lung field, patient was instructed on signs and symptoms of pneumothorax and, however unlikely, to see immediate medical attention should they occur. Patient was also educated on signs and symptoms of infection and to seek medical attention should they occur. Patient verbalized understanding of these instructions and education.    [facility] Adult PT Treatment:                                                DATE: 03/25/23 Therapeutic Exercise: Prone flexion/row/extension/hor abd/scaption 3# Manual Therapy: Skilled palpation to identify taught and irritable bands in R teres Major Trigger Point Dry Needling Treatment: Pre-treatment instruction: Patient instructed on dry needling rationale, procedures, and possible side effects including pain during treatment (achy,cramping feeling), bruising, drop of blood, lightheadedness, nausea, sweating. Patient Consent Given: Yes Education handout provided: No Muscles treated: R Teres major Needle size and number: .25x73mm x 1 Electrical stimulation performed: No Parameters: N/A Treatment response/outcome: Palpable decrease in muscle tension Post-treatment instructions: Patient instructed to expect possible mild to moderate muscle soreness later today and/or tomorrow. Patient instructed in methods to reduce muscle  soreness and to continue prescribed HEP. If patient was dry needled over the lung field, patient was instructed on signs and symptoms of pneumothorax and, however unlikely, to see immediate medical attention should they occur. Patient was also educated on signs and symptoms of infection and to seek medical attention should they occur. Patient verbalized understanding of these instructions and education.    [facility] Adult PT Treatment:                                                DATE: 03/20/23 Therapeutic Exercise: Nustep L4 6 min Omega row 25# high/low grip 15/15 Omega lat pulldown 25# 15x 4 way scapula 15x ea. IR stretch on stabilized scapula. Manual Therapy: Skilled palpation to identify taught and irritable bands in R teres Major Trigger Point Dry Needling Treatment: Pre-treatment instruction: Patient instructed on dry needling rationale, procedures, and possible side effects including pain during treatment (achy,cramping feeling), bruising, drop of blood, lightheadedness, nausea, sweating. Patient Consent Given: Yes Education handout provided: No Muscles treated: R Teres minor  Needle size and number: .30x62mm x 1 and .25x87mm x 1 Electrical stimulation performed: No Parameters: N/A Treatment response/outcome: Palpable decrease in muscle tension Post-treatment instructions: Patient instructed to expect possible mild to moderate muscle soreness later today and/or tomorrow. Patient instructed in methods to reduce muscle soreness and to continue prescribed HEP. If patient was dry needled over the lung field, patient was instructed on signs and symptoms of pneumothorax and, however unlikely, to see immediate medical attention should they occur. Patient was also educated on signs and symptoms of infection and to seek medical attention should they occur. Patient verbalized understanding of these instructions and education.    PATIENT EDUCATION:  Education details: continue HEP Person educated:  Patient Education method: Explanation Education comprehension: verbalized understanding and needs further education  HOME EXERCISE PROGRAM: Access Code: [email or web address] URL: [email or web address] Date: 03/18/2023 Prepared by: Gustavus Bryant  Exercises - Shoulder External Rotation and Scapular Retraction with Resistance  - 2 x daily - 5 x weekly - 2 sets - 15 reps -  Standing Shoulder Horizontal Abduction with Resistance  - 2 x daily - 5 x weekly - 2 sets - 15 reps - Scaption Wall Slide with Towel  - 2 x daily - 5 x weekly - 2 sets - 15 reps - Standing Shoulder Scaption with Resistance  - 2 x daily - 5 x weekly - 2 sets - 15 reps - Sternocleidomastoid Stretch  - 2 x daily - 5 x weekly - 1 sets - 2 reps - 30s hold  ASSESSMENT:  CLINICAL IMPRESSION:  Pt was able to complete all prescribed exercises with no adverse effect. Therapy today focused on periscapular and shoulder strengthening for decreasing pain. Pt continues to benefit from skilled PT, will continue per POC and assess goals at next session.   OBJECTIVE IMPAIRMENTS: decreased activity tolerance, decreased knowledge of condition, decreased mobility, decreased ROM, decreased strength, impaired UE functional use, postural dysfunction, and pain.   ACTIVITY LIMITATIONS: carrying, lifting, and reach over head  PERSONAL FACTORS: Fitness, Time since onset of injury/illness/exacerbation, and 1 comorbidity: R RC pathology  are also affecting patient's functional outcome.   REHAB POTENTIAL: Good  CLINICAL DECISION MAKING: Stable/uncomplicated  EVALUATION COMPLEXITY: Low   GOALS: Goals reviewed with patient? No  SHORT TERM GOALS: Target date: 03/18/2023  Patient to demonstrate independence in HEP  Baseline:  [id number] Goal status: Met  2.  Increase L lateral flexion to 50% Baseline:  Active ROM A/PROM (deg) eval 03/10/23 03/18/23  Flexion 50% 50% 50%  Extension 50% 50% 90%  Right lateral flexion 90% 100% 100%  Left  lateral flexion 25% 75% 75%  Right rotation 75% 90% 90%  Left rotation 50% 75% 90%   Goal status: Met   LONG TERM GOALS: Target date: 03/18/2023  Patient will score at least 53% on FOTO to signify clinically meaningful improvement in functional abilities.   Baseline: 43%; 03/18/23 63% Goal status: Met  2.  Increase cervical ROM to 75% throughout Baseline:  Active ROM A/PROM (deg) eval 03/10/23  Flexion 50% 50%  Extension 50% 50%  Right lateral flexion 90% 100%  Left lateral flexion 25% 75%  Right rotation 75% 90%  Left rotation 50% 75%   Goal status: Ongoing  3.  Decrease worst pain to 6/10 Baseline: 8/10; 03/18/23 5/10 Goal status: Met  4.  Decrease tenderness in R UT, SCM, infraspinatus and scalene groups to minimal Baseline: moderate Goal status: Ongoing   PLAN:  PT FREQUENCY: 1x/week  PT DURATION: 5 weeks  PLANNED INTERVENTIONS: Therapeutic exercises, Therapeutic activity, Neuromuscular re-education, Balance training, Gait training, Patient/Family education, Self Care, Joint mobilization, Dry Needling, Electrical stimulation, Spinal mobilization, Cryotherapy, Moist heat, Manual therapy, and Re-evaluation  PLAN FOR NEXT SESSION: HEP review and update, manual techniques as appropriate, aerobic tasks, ROM and flexibility activities, strengthening and PREs, TPDN, gait and balance training as needed   For all possible CPT codes, reference the Planned Interventions line above.     Check all conditions that are expected to impact treatment: {Conditions expected to impact treatment:Complications related to surgery   If treatment provided at initial evaluation, no treatment charged due to lack of authorization.       Eloy End, PT 04/11/2023, 8:00 AM

## 2023-04-14 DIAGNOSIS — F330 Major depressive disorder, recurrent, mild: Secondary | ICD-10-CM | POA: Diagnosis not present

## 2023-04-14 DIAGNOSIS — F411 Generalized anxiety disorder: Secondary | ICD-10-CM | POA: Diagnosis not present

## 2023-04-15 NOTE — Therapy (Unsigned)
OUTPATIENT PHYSICAL THERAPY TREATMENT NOTE   Patient Name: [name] MRN: [id number] DOB:05-Oct-1975, 47 y.o., female Today's Date: 04/16/2023  END OF SESSION:  PT End of Session - 04/16/23 1618     Visit Number 12    Number of Visits 12    Date for PT Re-Evaluation 04/06/23    Authorization Type MCD    Authorization Time Period 7 visits approved 02/17/23-04/17/23    Authorization - Number of Visits 7    PT Start Time 1620    PT Stop Time 1700    PT Time Calculation (min) 40 min    Activity Tolerance Patient tolerated treatment well;Patient limited by pain    Behavior During Therapy [facility] for tasks assessed/performed              Past Medical History:  Diagnosis Date   Allergy    Anemia    Anxiety    Apnea 12/08/2019   Arthritis    Asthma    Back pain    Constipation    Exertional dyspnea 03/14/2020   Fatty liver    GERD (gastroesophageal reflux disease)    H/O: hysterectomy    Hx of endometriosis    Inappropriate sinus tachycardia (HCC) 12/08/2019   Insomnia    Joint pain    Lactose intolerance    Migraine    Morbid obesity (HCC) 12/08/2019   Osteoporosis    Pneumonia    Pre-diabetes    Urticaria    Vitamin D deficiency    Past Surgical History:  Procedure Laterality Date   11 HOUR PH STUDY N/A 01/12/2018   Procedure: 24 HOUR PH STUDY;  Surgeon: Napoleon Form, MD;  Location: [facility];  Service: Endoscopy;  Laterality: N/A;   ABDOMINAL HYSTERECTOMY  12/12/2003   ADENOIDECTOMY  05/2011   ANKLE ARTHROSCOPY  12/29/2007   right; with extensive debridement   BLADDER NECK RECONSTRUCTION  01/14/2011   Procedure: BLADDER NECK REPAIR;  Surgeon: Reva Bores, MD;  Location: [facility];  Service: Gynecology;  Laterality: N/A;  Laparoscopic Repair of Incidental Cystotomy   BREAST REDUCTION SURGERY  08/05/2011   Procedure: MAMMARY REDUCTION  (BREAST);  Surgeon: Louisa Second, MD;  Location: [facility];  Service: Plastics;   Laterality: Bilateral;  bilateral   CESAREAN SECTION  12/29/2000; 1994   CHOLECYSTECTOMY     age 18   DILATION AND CURETTAGE OF UTERUS  07/08/2003   open laparoscopy   ESOPHAGEAL MANOMETRY N/A 01/12/2018   Procedure: ESOPHAGEAL MANOMETRY (EM);  Surgeon: Napoleon Form, MD;  Location: [facility];  Service: Endoscopy;  Laterality: N/A;   GASTRIC ROUX-EN-Y N/A 03/19/2022   Procedure: LAPAROSCOPIC ROUX-EN-Y GASTRIC BYPASS WITH UPPER ENDOSCOPY;  Surgeon: Gaynelle Adu, MD;  Location: [facility];  Service: General;  Laterality: N/A;   GIVENS CAPSULE STUDY N/A 06/23/2017   Procedure: GIVENS CAPSULE STUDY;  Surgeon: Benancio Deeds, MD;  Location: [facility];  Service: Gastroenterology;  Laterality: N/A;   HIATAL HERNIA REPAIR N/A 03/19/2022   Procedure: HERNIA REPAIR HIATAL;  Surgeon: Gaynelle Adu, MD;  Location: [facility];  Service: General;  Laterality: N/A;   LAPAROSCOPIC SALPINGOOPHERECTOMY  01/14/2011   left   LAPAROSCOPY N/A 11/01/2022   Procedure: LAPAROSCOPY DIAGNOSTIC; LAPARSCOPIC CLOSURE OF PETERSEN SPACE;  Surgeon: Gaynelle Adu, MD;  Location: [facility];  Service: General;  Laterality: N/A;  DR Gerrit Friends TO ASSIST   PH IMPEDANCE STUDY N/A 01/12/2018   Procedure: PH IMPEDANCE STUDY;  Surgeon: Napoleon Form, MD;  Location:  [facility];  Service: Endoscopy;  Laterality: N/A;   RESECTION DISTAL CLAVICAL Right 09/25/2017   Procedure: RESECTION DISTAL CLAVICAL;  Surgeon: Bjorn Pippin, MD;  Location: [facility];  Service: Orthopedics;  Laterality: Right;   RIGHT OOPHORECTOMY  2011   with lysis of adhesions   SHOULDER ACROMIOPLASTY Right 09/25/2017   Procedure: SHOULDER ACROMIOPLASTY;  Surgeon: Bjorn Pippin, MD;  Location: [facility];  Service: Orthopedics;  Laterality: Right;   SHOULDER ARTHROSCOPY WITH DEBRIDEMENT AND BICEP TENDON REPAIR Right 09/25/2017   Procedure: SHOULDER ARTHROSCOPY WITH DEBRIDEMENT AND BICEP TENDON REPAIR;  Surgeon: Bjorn Pippin,  MD;  Location: [facility];  Service: Orthopedics;  Laterality: Right;   SHOULDER ARTHROSCOPY WITH ROTATOR CUFF REPAIR Right 09/25/2017   Procedure: SHOULDER ARTHROSCOPY WITH ROTATOR CUFF REPAIR;  Surgeon: Bjorn Pippin, MD;  Location: [facility];  Service: Orthopedics;  Laterality: Right;   TONSILLECTOMY  as a child   TUBAL LIGATION  12/29/2000   UPPER GASTROINTESTINAL ENDOSCOPY     UPPER GI ENDOSCOPY N/A 11/01/2022   Procedure: UPPER GI ENDOSCOPY;  Surgeon: Gaynelle Adu, MD;  Location: [facility];  Service: General;  Laterality: N/A;   Patient Active Problem List   Diagnosis Date Noted   Class 1 obesity due to excess calories with serious comorbidity and body mass index (BMI) of 33.0 to 33.9 in adult 11/05/2022   COVID-19 vaccination declined 11/05/2022   Abdominal pain 08/21/2022   Anemia of pregnancy 08/21/2022   Chronic pelvic pain in female 08/21/2022   History of cesarean section 08/21/2022   Irregular intermenstrual bleeding 08/21/2022   S/P gastric bypass 03/19/2022   Microcytic anemia 12/14/2021   Iron deficiency anemia 12/14/2021   Adverse food reaction 03/21/2021   Depressive disorder 01/10/2021   Osteoporosis 01/10/2021   Thyroid nodule 01/10/2021   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   Ascites 10/05/2020   Muscle tension dysphonia 08/10/2020   Other insomnia 07/27/2020   At risk for side effect of medication 07/27/2020   Other hyperlipidemia 05/15/2020   At risk for osteoporosis 05/15/2020   At risk for impaired metabolic function 04/04/2020   Other fatigue 03/22/2020   SOBOE (shortness of breath on exertion) 03/22/2020   Exertional dyspnea 03/14/2020   Cyst of ovary 03/11/2020   Endometriosis 03/11/2020   Herpes simplex type 2 infection 03/11/2020   Neuropathy, right side of face 03/08/2020   At risk for depression 03/08/2020   Primary osteoarthritis of both feet 02/26/2020   Hyperlipidemia 01/26/2020   At risk for heart disease 01/26/2020    Daytime somnolence 01/26/2020   Abnormal glucose 01/26/2020   Vitamin D deficiency 01/04/2020   Fatty liver 01/04/2020   B12 deficiency 01/04/2020   Moderate persistent asthma without complication 12/31/2019   Inappropriate sinus tachycardia (HCC) 12/08/2019   Apnea 12/08/2019   Anaphylactic reaction due to food, subsequent encounter 11/22/2019   Idiopathic urticaria 11/22/2019   Generalized anxiety disorder 04/08/2018   Atypical chest pain    Regurgitation of food    Laryngopharyngeal reflux (LPR) 07/30/2017   ANA positive 12/30/2016   Gastroesophageal reflux disease 06/27/2016   Disturbance of skin sensation 04/27/2014   Dizziness and giddiness 03/14/2014   Headache, migraine 03/14/2014   Myalgia and myositis, unspecified 06/07/2013   Nausea and vomiting in adult 08/22/2012   Pelvic pain in female 12/18/2010   ANEMIA-IRON DEFICIENCY 10/15/2006   Genital herpes 09/30/2006   Obesity (BMI 35.0-39.9 without comorbidity) 09/30/2006   Nonorganic sleep disorder 09/30/2006  Constipation 09/30/2006   NEURITIS, LUMBOSACRAL NOS 09/30/2006   Personal history of mental disorder 09/30/2006    PCP: [name], FNP   REFERRING PROVIDER: Teryl Lucy, MD  REFERRING DIAG: CERVICALGIA WITH RADICULOPATHY  THERAPY DIAG:  Cervicalgia  Abnormal posture  Muscle weakness (generalized)  Rationale for Evaluation and Treatment: Rehabilitation  ONSET DATE: chronic  SUBJECTIVE:                                                                                                                                                                                                         SUBJECTIVE STATEMENT: Mobility has increased but continues with 3/10 shoulder pain.  PERTINENT HISTORY:  HPI patient is a pleasant 47 year old female who comes in today with continued right shoulder and neck pain.  We have seen her several times for this.  She has had 2 previous right shoulder surgeries without  significant relief of symptoms.  She has had subsequent subacromial and glenohumeral cortisone injections without much relief.  MRI of the cervical spine and shoulder were obtained back in August of this year.  Neck MRI showed minimal cervical spine changes without any disc bulge or spinal canal or neural foraminal narrowing.  Right shoulder MRI showed mild supraspinatus tendinopathy.  No other findings.  She has been seeing Dr. Titus Dubin as she is previously had a positive ANA with sicca symptoms.  She is currently being referred to [facility] rheumatology for further work-up.   Review of Systems as detailed in HPI.  All others reviewed and are negative.  PAIN:  Are you having pain? Yes: NPRS scale: 3/10 Pain location: R shoulder/neck Pain description: ache Aggravating factors: activity, OH reaching Relieving factors: rest  PRECAUTIONS: None  RED FLAGS: None     WEIGHT BEARING RESTRICTIONS: No  FALLS:  Has patient fallen in last 6 months? No  OCCUPATION: bus driver  PLOF: Independent  PATIENT GOALS: To manage my neck pain  NEXT MD VISIT: 11/24  OBJECTIVE:  Note: Objective measures were completed at Evaluation unless otherwise noted.  DIAGNOSTIC FINDINGS:  None available  PATIENT SURVEYS:  FOTO 43(53 predicted)  03/18/23 63  POSTURE:  elevated R shoulder  PALPATION: TTP R SCM, scalenes, UT and infraspinatus    CERVICAL ROM:   Active ROM A/PROM (deg) eval 03/10/23 03/18/23 04/16/23  Flexion 50% 50% 50% 50%  Extension 50% 50% 90% 90%  Right lateral flexion 90% 100% 100% 100%  Left lateral flexion 25% 75% 75% 75%  Right rotation 75% 90% 90% 90%  Left rotation 50% 75% 90% 75%   (Blank  rows = not tested)  UPPER EXTREMITY ROM:  Active ROM Right eval Left eval 03/10/23 03/18/23  Shoulder flexion 160 170 160d 160d  Shoulder extension      Shoulder abduction 150 170 160d 160d  Shoulder adduction      Shoulder extension      Shoulder internal rotation [facility] WFL     Shoulder external rotation [facility] WFL    Elbow flexion      Elbow extension      Wrist flexion      Wrist extension      Wrist ulnar deviation      Wrist radial deviation      Wrist pronation      Wrist supination       (Blank rows = not tested)  UPPER EXTREMITY MMT: WFL throughout  MMT Right eval Left eval  Shoulder flexion    Shoulder extension    Shoulder abduction    Shoulder adduction    Shoulder extension    Shoulder internal rotation    Shoulder external rotation    Middle trapezius    Lower trapezius    Elbow flexion    Elbow extension    Wrist flexion    Wrist extension    Wrist ulnar deviation    Wrist radial deviation    Wrist pronation    Wrist supination    Grip strength     (Blank rows = not tested)  CERVICAL SPECIAL TESTS:  Neck flexor muscle endurance test: Positive 15s hold due to weakness; 03/10/23 27s  FUNCTIONAL TESTS:  5 times sit to stand: 12s  TODAY'S TREATMENT:    [facility] Adult PT Treatment:                                                DATE: 04/10/23 Therapeutic Exercise: UBE lvl 1.0 x 3 min while taking subjective Supine dow flexion 2x10 Supine horizontal abd 2x10 GTB Supine chin tuck 2x10 Supine serratus raise 2x10 YTB Seated bilateral ER 3x10 GTB Corner stretch 2x30"  FM row 2x10 13# FM shoulder ext 2x10 13# Seated low row 2x10 25# Seated lat pulldown 2x10 25#  [facility] Adult PT Treatment:                                                DATE: 04/01/23 Therapeutic Exercise: UBE lvl 1.0 x 3 min while taking subjective Seated low row 2x10 25# Seated lat pulldown 2x10 25# Supine horizontal abd 2x15 GTB Supine serratus raise 2x10 YTB Prone row 2x10 3# each Prone ext 2x10 3# each Manual Therapy: Skilled palpation to identify taught and irritable bands in R teres Major, R upper trap, R posterior deltoid Positional release to R upper trap Suboccipital release Trigger Point Dry Needling Treatment: Pre-treatment instruction: Patient  instructed on dry needling rationale, procedures, and possible side effects including pain during treatment (achy,cramping feeling), bruising, drop of blood, lightheadedness, nausea, sweating. Patient Consent Given: Yes Education handout provided: No Muscles treated: R posterior deltoid, R upper trap Needle size and number: .30x44mm x 2 Electrical stimulation performed: No Parameters: N/A Treatment response/outcome: Palpable decrease in muscle tension Post-treatment instructions: Patient instructed to expect possible mild to moderate muscle soreness later today and/or tomorrow. Patient instructed  in methods to reduce muscle soreness and to continue prescribed HEP. If patient was dry needled over the lung field, patient was instructed on signs and symptoms of pneumothorax and, however unlikely, to see immediate medical attention should they occur. Patient was also educated on signs and symptoms of infection and to seek medical attention should they occur. Patient verbalized understanding of these instructions and education.    [facility] Adult PT Treatment:                                                DATE: 03/25/23 Therapeutic Exercise: Prone flexion/row/extension/hor abd/scaption 3# Manual Therapy: Skilled palpation to identify taught and irritable bands in R teres Major Trigger Point Dry Needling Treatment: Pre-treatment instruction: Patient instructed on dry needling rationale, procedures, and possible side effects including pain during treatment (achy,cramping feeling), bruising, drop of blood, lightheadedness, nausea, sweating. Patient Consent Given: Yes Education handout provided: No Muscles treated: R Teres major Needle size and number: .25x23mm x 1 Electrical stimulation performed: No Parameters: N/A Treatment response/outcome: Palpable decrease in muscle tension Post-treatment instructions: Patient instructed to expect possible mild to moderate muscle soreness later today and/or  tomorrow. Patient instructed in methods to reduce muscle soreness and to continue prescribed HEP. If patient was dry needled over the lung field, patient was instructed on signs and symptoms of pneumothorax and, however unlikely, to see immediate medical attention should they occur. Patient was also educated on signs and symptoms of infection and to seek medical attention should they occur. Patient verbalized understanding of these instructions and education.    [facility] Adult PT Treatment:                                                DATE: 03/20/23 Therapeutic Exercise: Nustep L4 6 min Omega row 25# high/low grip 15/15 Omega lat pulldown 25# 15x 4 way scapula 15x ea. IR stretch on stabilized scapula. Manual Therapy: Skilled palpation to identify taught and irritable bands in R teres Major Trigger Point Dry Needling Treatment: Pre-treatment instruction: Patient instructed on dry needling rationale, procedures, and possible side effects including pain during treatment (achy,cramping feeling), bruising, drop of blood, lightheadedness, nausea, sweating. Patient Consent Given: Yes Education handout provided: No Muscles treated: R Teres minor  Needle size and number: .30x4mm x 1 and .25x26mm x 1 Electrical stimulation performed: No Parameters: N/A Treatment response/outcome: Palpable decrease in muscle tension Post-treatment instructions: Patient instructed to expect possible mild to moderate muscle soreness later today and/or tomorrow. Patient instructed in methods to reduce muscle soreness and to continue prescribed HEP. If patient was dry needled over the lung field, patient was instructed on signs and symptoms of pneumothorax and, however unlikely, to see immediate medical attention should they occur. Patient was also educated on signs and symptoms of infection and to seek medical attention should they occur. Patient verbalized understanding of these instructions and education.    PATIENT  EDUCATION:  Education details: continue HEP Person educated: Patient Education method: Explanation Education comprehension: verbalized understanding and needs further education  HOME EXERCISE PROGRAM: Access Code: [email or web address] URL: [email or web address] Date: 03/18/2023 Prepared by: Gustavus Bryant  Exercises - Shoulder External Rotation and Scapular Retraction with Resistance  - 2 x daily - 5 x  weekly - 2 sets - 15 reps - Standing Shoulder Horizontal Abduction with Resistance  - 2 x daily - 5 x weekly - 2 sets - 15 reps - Scaption Wall Slide with Towel  - 2 x daily - 5 x weekly - 2 sets - 15 reps - Standing Shoulder Scaption with Resistance  - 2 x daily - 5 x weekly - 2 sets - 15 reps - Sternocleidomastoid Stretch  - 2 x daily - 5 x weekly - 1 sets - 2 reps - 30s hold  ASSESSMENT:  CLINICAL IMPRESSION: R shoulder mobility and function have improved but cervical mobility is still lacking especially in flexion, R rotation and L lateral flexion.  Palpation finds continued tenderness and tightness in R SCM.  Patient would benefit from additional PT sessions to address ongoing muscle tension issues.  OBJECTIVE IMPAIRMENTS: decreased activity tolerance, decreased knowledge of condition, decreased mobility, decreased ROM, decreased strength, impaired UE functional use, postural dysfunction, and pain.   ACTIVITY LIMITATIONS: carrying, lifting, and reach over head  PERSONAL FACTORS: Fitness, Time since onset of injury/illness/exacerbation, and 1 comorbidity: R RC pathology  are also affecting patient's functional outcome.   REHAB POTENTIAL: Good  CLINICAL DECISION MAKING: Stable/uncomplicated  EVALUATION COMPLEXITY: Low   GOALS: Goals reviewed with patient? No  SHORT TERM GOALS: Target date: 03/18/2023  Patient to demonstrate independence in HEP  Baseline:  [id number] Goal status: Met  2.  Increase L lateral flexion to 50% Baseline:  Active ROM A/PROM (deg) eval 03/10/23  03/18/23 04/16/23  Flexion 50% 50% 50% 50%  Extension 50% 50% 90% 90%  Right lateral flexion 90% 100% 100% 100%  Left lateral flexion 25% 75% 75% 75%  Right rotation 75% 90% 90% 90%  Left rotation 50% 75% 90% 75%   Goal status: Met   LONG TERM GOALS: Target date: 05/18/2023  Patient will score at least 53% on FOTO to signify clinically meaningful improvement in functional abilities.   Baseline: 43%; 03/18/23 63% Goal status: Met  2.  Increase cervical ROM to 75% throughout Baseline:  Active ROM A/PROM (deg) eval 03/10/23 03/18/23 04/16/23  Flexion 50% 50% 50% 50%  Extension 50% 50% 90% 90%  Right lateral flexion 90% 100% 100% 100%  Left lateral flexion 25% 75% 75% 75%  Right rotation 75% 90% 90% 90%  Left rotation 50% 75% 90% 75%   Goal status: Ongoing in flexion only  3.  Decrease worst pain to 6/10 Baseline: 8/10; 03/18/23 5/10 Goal status: Met  4.  Decrease tenderness in R UT, SCM, infraspinatus and scalene groups to minimal Baseline: moderate; 04/16/23 continued tightness in R SCM Goal status: Ongoing   PLAN:  PT FREQUENCY: 1x/week  PT DURATION: 4 weeks  PLANNED INTERVENTIONS: 97164- PT Re-evaluation, 97110-Therapeutic exercises, 97530- Therapeutic activity, 97112- Neuromuscular re-education, 97535- Self Care, 15176- Manual therapy, 97116- Gait training, 97014- Electrical stimulation (unattended), Patient/Family education, Balance training, Dry Needling, Joint mobilization, Spinal mobilization, Cryotherapy, and Moist heat  PLAN FOR NEXT SESSION: HEP review and update, manual techniques as appropriate, aerobic tasks, ROM and flexibility activities, strengthening and PREs, TPDN, gait and balance training as needed   For all possible CPT codes, reference the Planned Interventions line above.     Check all conditions that are expected to impact treatment: {Conditions expected to impact treatment:Complications related to surgery   If treatment provided at initial  evaluation, no treatment charged due to lack of authorization.       Hildred Laser, PT 04/16/2023, 4:38 PM

## 2023-04-16 ENCOUNTER — Ambulatory Visit: Payer: Medicaid Other

## 2023-04-16 DIAGNOSIS — M542 Cervicalgia: Secondary | ICD-10-CM | POA: Diagnosis not present

## 2023-04-16 DIAGNOSIS — M6281 Muscle weakness (generalized): Secondary | ICD-10-CM

## 2023-04-16 DIAGNOSIS — R293 Abnormal posture: Secondary | ICD-10-CM

## 2023-04-21 NOTE — Therapy (Deleted)
OUTPATIENT PHYSICAL THERAPY TREATMENT NOTE   Patient Name: [name] MRN: [id number] DOB:March 11, 1976, 47 y.o., female Today's Date: 04/21/2023  END OF SESSION:     Past Medical History:  Diagnosis Date   Allergy    Anemia    Anxiety    Apnea 12/08/2019   Arthritis    Asthma    Back pain    Constipation    Exertional dyspnea 03/14/2020   Fatty liver    GERD (gastroesophageal reflux disease)    H/O: hysterectomy    Hx of endometriosis    Inappropriate sinus tachycardia (HCC) 12/08/2019   Insomnia    Joint pain    Lactose intolerance    Migraine    Morbid obesity (HCC) 12/08/2019   Osteoporosis    Pneumonia    Pre-diabetes    Urticaria    Vitamin D deficiency    Past Surgical History:  Procedure Laterality Date   99 HOUR PH STUDY N/A 01/12/2018   Procedure: 24 HOUR PH STUDY;  Surgeon: Napoleon Form, MD;  Location: [facility];  Service: Endoscopy;  Laterality: N/A;   ABDOMINAL HYSTERECTOMY  12/12/2003   ADENOIDECTOMY  05/2011   ANKLE ARTHROSCOPY  12/29/2007   right; with extensive debridement   BLADDER NECK RECONSTRUCTION  01/14/2011   Procedure: BLADDER NECK REPAIR;  Surgeon: Reva Bores, MD;  Location: [facility];  Service: Gynecology;  Laterality: N/A;  Laparoscopic Repair of Incidental Cystotomy   BREAST REDUCTION SURGERY  08/05/2011   Procedure: MAMMARY REDUCTION  (BREAST);  Surgeon: Louisa Second, MD;  Location: [facility];  Service: Plastics;  Laterality: Bilateral;  bilateral   CESAREAN SECTION  12/29/2000; 1994   CHOLECYSTECTOMY     age 47   DILATION AND CURETTAGE OF UTERUS  07/08/2003   open laparoscopy   ESOPHAGEAL MANOMETRY N/A 01/12/2018   Procedure: ESOPHAGEAL MANOMETRY (EM);  Surgeon: Napoleon Form, MD;  Location: [facility];  Service: Endoscopy;  Laterality: N/A;   GASTRIC ROUX-EN-Y N/A 03/19/2022   Procedure: LAPAROSCOPIC ROUX-EN-Y GASTRIC BYPASS WITH UPPER ENDOSCOPY;  Surgeon: Gaynelle Adu, MD;  Location:  [facility];  Service: General;  Laterality: N/A;   GIVENS CAPSULE STUDY N/A 06/23/2017   Procedure: GIVENS CAPSULE STUDY;  Surgeon: Benancio Deeds, MD;  Location: [facility];  Service: Gastroenterology;  Laterality: N/A;   HIATAL HERNIA REPAIR N/A 03/19/2022   Procedure: HERNIA REPAIR HIATAL;  Surgeon: Gaynelle Adu, MD;  Location: [facility];  Service: General;  Laterality: N/A;   LAPAROSCOPIC SALPINGOOPHERECTOMY  01/14/2011   left   LAPAROSCOPY N/A 11/01/2022   Procedure: LAPAROSCOPY DIAGNOSTIC; LAPARSCOPIC CLOSURE OF PETERSEN SPACE;  Surgeon: Gaynelle Adu, MD;  Location: [facility];  Service: General;  Laterality: N/A;  DR Gerrit Friends TO ASSIST   PH IMPEDANCE STUDY N/A 01/12/2018   Procedure: PH IMPEDANCE STUDY;  Surgeon: Napoleon Form, MD;  Location: [facility];  Service: Endoscopy;  Laterality: N/A;   RESECTION DISTAL CLAVICAL Right 09/25/2017   Procedure: RESECTION DISTAL CLAVICAL;  Surgeon: Bjorn Pippin, MD;  Location: [facility];  Service: Orthopedics;  Laterality: Right;   RIGHT OOPHORECTOMY  2011   with lysis of adhesions   SHOULDER ACROMIOPLASTY Right 09/25/2017   Procedure: SHOULDER ACROMIOPLASTY;  Surgeon: Bjorn Pippin, MD;  Location: [facility];  Service: Orthopedics;  Laterality: Right;   SHOULDER ARTHROSCOPY WITH DEBRIDEMENT AND BICEP TENDON REPAIR Right 09/25/2017   Procedure: SHOULDER ARTHROSCOPY WITH DEBRIDEMENT AND BICEP TENDON REPAIR;  Surgeon: Bjorn Pippin, MD;  Location: [facility];  Service: Orthopedics;  Laterality: Right;   SHOULDER ARTHROSCOPY WITH ROTATOR CUFF REPAIR Right 09/25/2017   Procedure: SHOULDER ARTHROSCOPY WITH ROTATOR CUFF REPAIR;  Surgeon: Bjorn Pippin, MD;  Location: [facility];  Service: Orthopedics;  Laterality: Right;   TONSILLECTOMY  as a child   TUBAL LIGATION  12/29/2000   UPPER GASTROINTESTINAL ENDOSCOPY     UPPER GI ENDOSCOPY N/A 11/01/2022   Procedure: UPPER GI ENDOSCOPY;  Surgeon:  Gaynelle Adu, MD;  Location: [facility];  Service: General;  Laterality: N/A;   Patient Active Problem List   Diagnosis Date Noted   Class 1 obesity due to excess calories with serious comorbidity and body mass index (BMI) of 33.0 to 33.9 in adult 11/05/2022   COVID-19 vaccination declined 11/05/2022   Abdominal pain 08/21/2022   Anemia of pregnancy 08/21/2022   Chronic pelvic pain in female 08/21/2022   History of cesarean section 08/21/2022   Irregular intermenstrual bleeding 08/21/2022   S/P gastric bypass 03/19/2022   Microcytic anemia 12/14/2021   Iron deficiency anemia 12/14/2021   Adverse food reaction 03/21/2021   Depressive disorder 01/10/2021   Osteoporosis 01/10/2021   Thyroid nodule 01/10/2021   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   Ascites 10/05/2020   Muscle tension dysphonia 08/10/2020   Other insomnia 07/27/2020   At risk for side effect of medication 07/27/2020   Other hyperlipidemia 05/15/2020   At risk for osteoporosis 05/15/2020   At risk for impaired metabolic function 04/04/2020   Other fatigue 03/22/2020   SOBOE (shortness of breath on exertion) 03/22/2020   Exertional dyspnea 03/14/2020   Cyst of ovary 03/11/2020   Endometriosis 03/11/2020   Herpes simplex type 2 infection 03/11/2020   Neuropathy, right side of face 03/08/2020   At risk for depression 03/08/2020   Primary osteoarthritis of both feet 02/26/2020   Hyperlipidemia 01/26/2020   At risk for heart disease 01/26/2020   Daytime somnolence 01/26/2020   Abnormal glucose 01/26/2020   Vitamin D deficiency 01/04/2020   Fatty liver 01/04/2020   B12 deficiency 01/04/2020   Moderate persistent asthma without complication 12/31/2019   Inappropriate sinus tachycardia (HCC) 12/08/2019   Apnea 12/08/2019   Anaphylactic reaction due to food, subsequent encounter 11/22/2019   Idiopathic urticaria 11/22/2019   Generalized anxiety disorder 04/08/2018   Atypical chest pain    Regurgitation of food     Laryngopharyngeal reflux (LPR) 07/30/2017   ANA positive 12/30/2016   Gastroesophageal reflux disease 06/27/2016   Disturbance of skin sensation 04/27/2014   Dizziness and giddiness 03/14/2014   Headache, migraine 03/14/2014   Myalgia and myositis, unspecified 06/07/2013   Nausea and vomiting in adult 08/22/2012   Pelvic pain in female 12/18/2010   ANEMIA-IRON DEFICIENCY 10/15/2006   Genital herpes 09/30/2006   Obesity (BMI 35.0-39.9 without comorbidity) 09/30/2006   Nonorganic sleep disorder 09/30/2006   Constipation 09/30/2006   NEURITIS, LUMBOSACRAL NOS 09/30/2006   Personal history of mental disorder 09/30/2006    PCP: [name], FNP   REFERRING PROVIDER: Teryl Lucy, MD  REFERRING DIAG: CERVICALGIA WITH RADICULOPATHY  THERAPY DIAG:  No diagnosis found.  Rationale for Evaluation and Treatment: Rehabilitation  ONSET DATE: chronic  SUBJECTIVE:  SUBJECTIVE STATEMENT: Mobility has increased but continues with 3/10 shoulder pain.  PERTINENT HISTORY:  HPI patient is a pleasant 47 year old female who comes in today with continued right shoulder and neck pain.  We have seen her several times for this.  She has had 2 previous right shoulder surgeries without significant relief of symptoms.  She has had subsequent subacromial and glenohumeral cortisone injections without much relief.  MRI of the cervical spine and shoulder were obtained back in August of this year.  Neck MRI showed minimal cervical spine changes without any disc bulge or spinal canal or neural foraminal narrowing.  Right shoulder MRI showed mild supraspinatus tendinopathy.  No other findings.  She has been seeing Dr. Titus Dubin as she is previously had a positive ANA with sicca symptoms.  She is currently being  referred to [facility] rheumatology for further work-up.   Review of Systems as detailed in HPI.  All others reviewed and are negative.  PAIN:  Are you having pain? Yes: NPRS scale: 3/10 Pain location: R shoulder/neck Pain description: ache Aggravating factors: activity, OH reaching Relieving factors: rest  PRECAUTIONS: None  RED FLAGS: None     WEIGHT BEARING RESTRICTIONS: No  FALLS:  Has patient fallen in last 6 months? No  OCCUPATION: bus driver  PLOF: Independent  PATIENT GOALS: To manage my neck pain  NEXT MD VISIT: 11/24  OBJECTIVE:  Note: Objective measures were completed at Evaluation unless otherwise noted.  DIAGNOSTIC FINDINGS:  None available  PATIENT SURVEYS:  FOTO 43(53 predicted)  03/18/23 63  POSTURE:  elevated R shoulder  PALPATION: TTP R SCM, scalenes, UT and infraspinatus    CERVICAL ROM:   Active ROM A/PROM (deg) eval 03/10/23 03/18/23 04/16/23  Flexion 50% 50% 50% 50%  Extension 50% 50% 90% 90%  Right lateral flexion 90% 100% 100% 100%  Left lateral flexion 25% 75% 75% 75%  Right rotation 75% 90% 90% 90%  Left rotation 50% 75% 90% 75%   (Blank rows = not tested)  UPPER EXTREMITY ROM:  Active ROM Right eval Left eval 03/10/23 03/18/23  Shoulder flexion 160 170 160d 160d  Shoulder extension      Shoulder abduction 150 170 160d 160d  Shoulder adduction      Shoulder extension      Shoulder internal rotation [facility] WFL    Shoulder external rotation [facility] WFL    Elbow flexion      Elbow extension      Wrist flexion      Wrist extension      Wrist ulnar deviation      Wrist radial deviation      Wrist pronation      Wrist supination       (Blank rows = not tested)  UPPER EXTREMITY MMT: WFL throughout  MMT Right eval Left eval  Shoulder flexion    Shoulder extension    Shoulder abduction    Shoulder adduction    Shoulder extension    Shoulder internal rotation    Shoulder external rotation    Middle trapezius    Lower trapezius     Elbow flexion    Elbow extension    Wrist flexion    Wrist extension    Wrist ulnar deviation    Wrist radial deviation    Wrist pronation    Wrist supination    Grip strength     (Blank rows = not tested)  CERVICAL SPECIAL TESTS:  Neck flexor muscle endurance test: Positive 15s hold due  to weakness; 03/10/23 27s  FUNCTIONAL TESTS:  5 times sit to stand: 12s  TODAY'S TREATMENT:    [facility] Adult PT Treatment:                                                DATE: 04/10/23 Therapeutic Exercise: UBE lvl 1.0 x 3 min while taking subjective Supine dow flexion 2x10 Supine horizontal abd 2x10 GTB Supine chin tuck 2x10 Supine serratus raise 2x10 YTB Seated bilateral ER 3x10 GTB Corner stretch 2x30"  FM row 2x10 13# FM shoulder ext 2x10 13# Seated low row 2x10 25# Seated lat pulldown 2x10 25#  [facility] Adult PT Treatment:                                                DATE: 04/01/23 Therapeutic Exercise: UBE lvl 1.0 x 3 min while taking subjective Seated low row 2x10 25# Seated lat pulldown 2x10 25# Supine horizontal abd 2x15 GTB Supine serratus raise 2x10 YTB Prone row 2x10 3# each Prone ext 2x10 3# each Manual Therapy: Skilled palpation to identify taught and irritable bands in R teres Major, R upper trap, R posterior deltoid Positional release to R upper trap Suboccipital release Trigger Point Dry Needling Treatment: Pre-treatment instruction: Patient instructed on dry needling rationale, procedures, and possible side effects including pain during treatment (achy,cramping feeling), bruising, drop of blood, lightheadedness, nausea, sweating. Patient Consent Given: Yes Education handout provided: No Muscles treated: R posterior deltoid, R upper trap Needle size and number: .30x58mm x 2 Electrical stimulation performed: No Parameters: N/A Treatment response/outcome: Palpable decrease in muscle tension Post-treatment instructions: Patient instructed to expect possible mild to  moderate muscle soreness later today and/or tomorrow. Patient instructed in methods to reduce muscle soreness and to continue prescribed HEP. If patient was dry needled over the lung field, patient was instructed on signs and symptoms of pneumothorax and, however unlikely, to see immediate medical attention should they occur. Patient was also educated on signs and symptoms of infection and to seek medical attention should they occur. Patient verbalized understanding of these instructions and education.    [facility] Adult PT Treatment:                                                DATE: 03/25/23 Therapeutic Exercise: Prone flexion/row/extension/hor abd/scaption 3# Manual Therapy: Skilled palpation to identify taught and irritable bands in R teres Major Trigger Point Dry Needling Treatment: Pre-treatment instruction: Patient instructed on dry needling rationale, procedures, and possible side effects including pain during treatment (achy,cramping feeling), bruising, drop of blood, lightheadedness, nausea, sweating. Patient Consent Given: Yes Education handout provided: No Muscles treated: R Teres major Needle size and number: .25x22mm x 1 Electrical stimulation performed: No Parameters: N/A Treatment response/outcome: Palpable decrease in muscle tension Post-treatment instructions: Patient instructed to expect possible mild to moderate muscle soreness later today and/or tomorrow. Patient instructed in methods to reduce muscle soreness and to continue prescribed HEP. If patient was dry needled over the lung field, patient was instructed on signs and symptoms of pneumothorax and, however unlikely, to see immediate medical attention  should they occur. Patient was also educated on signs and symptoms of infection and to seek medical attention should they occur. Patient verbalized understanding of these instructions and education.    [facility] Adult PT Treatment:                                                DATE:  03/20/23 Therapeutic Exercise: Nustep L4 6 min Omega row 25# high/low grip 15/15 Omega lat pulldown 25# 15x 4 way scapula 15x ea. IR stretch on stabilized scapula. Manual Therapy: Skilled palpation to identify taught and irritable bands in R teres Major Trigger Point Dry Needling Treatment: Pre-treatment instruction: Patient instructed on dry needling rationale, procedures, and possible side effects including pain during treatment (achy,cramping feeling), bruising, drop of blood, lightheadedness, nausea, sweating. Patient Consent Given: Yes Education handout provided: No Muscles treated: R Teres minor  Needle size and number: .30x58mm x 1 and .25x10mm x 1 Electrical stimulation performed: No Parameters: N/A Treatment response/outcome: Palpable decrease in muscle tension Post-treatment instructions: Patient instructed to expect possible mild to moderate muscle soreness later today and/or tomorrow. Patient instructed in methods to reduce muscle soreness and to continue prescribed HEP. If patient was dry needled over the lung field, patient was instructed on signs and symptoms of pneumothorax and, however unlikely, to see immediate medical attention should they occur. Patient was also educated on signs and symptoms of infection and to seek medical attention should they occur. Patient verbalized understanding of these instructions and education.    PATIENT EDUCATION:  Education details: continue HEP Person educated: Patient Education method: Explanation Education comprehension: verbalized understanding and needs further education  HOME EXERCISE PROGRAM: Access Code: [email or web address] URL: [email or web address] Date: 03/18/2023 Prepared by: Gustavus Bryant  Exercises - Shoulder External Rotation and Scapular Retraction with Resistance  - 2 x daily - 5 x weekly - 2 sets - 15 reps - Standing Shoulder Horizontal Abduction with Resistance  - 2 x daily - 5 x weekly - 2 sets - 15 reps -  Scaption Wall Slide with Towel  - 2 x daily - 5 x weekly - 2 sets - 15 reps - Standing Shoulder Scaption with Resistance  - 2 x daily - 5 x weekly - 2 sets - 15 reps - Sternocleidomastoid Stretch  - 2 x daily - 5 x weekly - 1 sets - 2 reps - 30s hold  ASSESSMENT:  CLINICAL IMPRESSION: R shoulder mobility and function have improved but cervical mobility is still lacking especially in flexion, R rotation and L lateral flexion.  Palpation finds continued tenderness and tightness in R SCM.  Patient would benefit from additional PT sessions to address ongoing muscle tension issues.  OBJECTIVE IMPAIRMENTS: decreased activity tolerance, decreased knowledge of condition, decreased mobility, decreased ROM, decreased strength, impaired UE functional use, postural dysfunction, and pain.   ACTIVITY LIMITATIONS: carrying, lifting, and reach over head  PERSONAL FACTORS: Fitness, Time since onset of injury/illness/exacerbation, and 1 comorbidity: R RC pathology  are also affecting patient's functional outcome.   REHAB POTENTIAL: Good  CLINICAL DECISION MAKING: Stable/uncomplicated  EVALUATION COMPLEXITY: Low   GOALS: Goals reviewed with patient? No  SHORT TERM GOALS: Target date: 03/18/2023  Patient to demonstrate independence in HEP  Baseline:  [id number] Goal status: Met  2.  Increase L lateral flexion to 50% Baseline:  Active ROM A/PROM (deg) eval 03/10/23 03/18/23 04/16/23  Flexion 50% 50% 50% 50%  Extension 50% 50% 90% 90%  Right lateral flexion 90% 100% 100% 100%  Left lateral flexion 25% 75% 75% 75%  Right rotation 75% 90% 90% 90%  Left rotation 50% 75% 90% 75%   Goal status: Met   LONG TERM GOALS: Target date: 05/18/2023  Patient will score at least 53% on FOTO to signify clinically meaningful improvement in functional abilities.   Baseline: 43%; 03/18/23 63% Goal status: Met  2.  Increase cervical ROM to 75% throughout Baseline:  Active ROM A/PROM (deg) eval 03/10/23  03/18/23 04/16/23  Flexion 50% 50% 50% 50%  Extension 50% 50% 90% 90%  Right lateral flexion 90% 100% 100% 100%  Left lateral flexion 25% 75% 75% 75%  Right rotation 75% 90% 90% 90%  Left rotation 50% 75% 90% 75%   Goal status: Ongoing in flexion only  3.  Decrease worst pain to 6/10 Baseline: 8/10; 03/18/23 5/10 Goal status: Met  4.  Decrease tenderness in R UT, SCM, infraspinatus and scalene groups to minimal Baseline: moderate; 04/16/23 continued tightness in R SCM Goal status: Ongoing   PLAN:  PT FREQUENCY: 1x/week  PT DURATION: 4 weeks  PLANNED INTERVENTIONS: 97164- PT Re-evaluation, 97110-Therapeutic exercises, 97530- Therapeutic activity, 97112- Neuromuscular re-education, 97535- Self Care, [id number]- Manual therapy, 97116- Gait training, 97014- Electrical stimulation (unattended), Patient/Family education, Balance training, Dry Needling, Joint mobilization, Spinal mobilization, Cryotherapy, and Moist heat  PLAN FOR NEXT SESSION: HEP review and update, manual techniques as appropriate, aerobic tasks, ROM and flexibility activities, strengthening and PREs, TPDN, gait and balance training as needed   For all possible CPT codes, reference the Planned Interventions line above.     Check all conditions that are expected to impact treatment: {Conditions expected to impact treatment:Complications related to surgery   If treatment provided at initial evaluation, no treatment charged due to lack of authorization.       Hildred Laser, PT 04/21/2023, 3:48 PM

## 2023-04-22 ENCOUNTER — Ambulatory Visit: Payer: Medicaid Other

## 2023-04-29 DIAGNOSIS — R202 Paresthesia of skin: Secondary | ICD-10-CM | POA: Diagnosis not present

## 2023-04-29 DIAGNOSIS — M545 Low back pain, unspecified: Secondary | ICD-10-CM | POA: Diagnosis not present

## 2023-04-29 DIAGNOSIS — M542 Cervicalgia: Secondary | ICD-10-CM | POA: Diagnosis not present

## 2023-04-29 DIAGNOSIS — Z79891 Long term (current) use of opiate analgesic: Secondary | ICD-10-CM | POA: Diagnosis not present

## 2023-04-29 DIAGNOSIS — M5412 Radiculopathy, cervical region: Secondary | ICD-10-CM | POA: Diagnosis not present

## 2023-04-29 DIAGNOSIS — G894 Chronic pain syndrome: Secondary | ICD-10-CM | POA: Diagnosis not present

## 2023-05-01 ENCOUNTER — Ambulatory Visit: Payer: Medicaid Other

## 2023-05-01 DIAGNOSIS — M542 Cervicalgia: Secondary | ICD-10-CM

## 2023-05-01 DIAGNOSIS — M6281 Muscle weakness (generalized): Secondary | ICD-10-CM

## 2023-05-01 DIAGNOSIS — R293 Abnormal posture: Secondary | ICD-10-CM

## 2023-05-01 NOTE — Therapy (Addendum)
OUTPATIENT PHYSICAL THERAPY TREATMENT NOTE/DISCHARGE SUMMARY   Patient Name: [name] MRN: [id number] DOB:03-Sep-1975, 47 y.o., female Today's Date: 05/01/2023  END OF SESSION:  PT End of Session - 05/01/23 1618     Visit Number 13    Number of Visits 17    Date for PT Re-Evaluation 04/06/23    Authorization Type MCD    Authorization Time Period 5 visits starting 12/16    Authorization - Visit Number 1    Authorization - Number of Visits 5    PT Start Time 1615    PT Stop Time 1655    PT Time Calculation (min) 40 min    Activity Tolerance Patient tolerated treatment well;Patient limited by pain    Behavior During Therapy [facility] for tasks assessed/performed               Past Medical History:  Diagnosis Date   Allergy    Anemia    Anxiety    Apnea 12/08/2019   Arthritis    Asthma    Back pain    Constipation    Exertional dyspnea 03/14/2020   Fatty liver    GERD (gastroesophageal reflux disease)    H/O: hysterectomy    Hx of endometriosis    Inappropriate sinus tachycardia (HCC) 12/08/2019   Insomnia    Joint pain    Lactose intolerance    Migraine    Morbid obesity (HCC) 12/08/2019   Osteoporosis    Pneumonia    Pre-diabetes    Urticaria    Vitamin D deficiency    Past Surgical History:  Procedure Laterality Date   29 HOUR PH STUDY N/A 01/12/2018   Procedure: 24 HOUR PH STUDY;  Surgeon: Napoleon Form, MD;  Location: [facility];  Service: Endoscopy;  Laterality: N/A;   ABDOMINAL HYSTERECTOMY  12/12/2003   ADENOIDECTOMY  05/2011   ANKLE ARTHROSCOPY  12/29/2007   right; with extensive debridement   BLADDER NECK RECONSTRUCTION  01/14/2011   Procedure: BLADDER NECK REPAIR;  Surgeon: Reva Bores, MD;  Location: [facility];  Service: Gynecology;  Laterality: N/A;  Laparoscopic Repair of Incidental Cystotomy   BREAST REDUCTION SURGERY  08/05/2011   Procedure: MAMMARY REDUCTION  (BREAST);  Surgeon: Louisa Second, MD;  Location: [facility];  Service: Plastics;  Laterality: Bilateral;  bilateral   CESAREAN SECTION  12/29/2000; 1994   CHOLECYSTECTOMY     age 39   DILATION AND CURETTAGE OF UTERUS  07/08/2003   open laparoscopy   ESOPHAGEAL MANOMETRY N/A 01/12/2018   Procedure: ESOPHAGEAL MANOMETRY (EM);  Surgeon: Napoleon Form, MD;  Location: [facility];  Service: Endoscopy;  Laterality: N/A;   GASTRIC ROUX-EN-Y N/A 03/19/2022   Procedure: LAPAROSCOPIC ROUX-EN-Y GASTRIC BYPASS WITH UPPER ENDOSCOPY;  Surgeon: Gaynelle Adu, MD;  Location: [facility];  Service: General;  Laterality: N/A;   GIVENS CAPSULE STUDY N/A 06/23/2017   Procedure: GIVENS CAPSULE STUDY;  Surgeon: Benancio Deeds, MD;  Location: [facility];  Service: Gastroenterology;  Laterality: N/A;   HIATAL HERNIA REPAIR N/A 03/19/2022   Procedure: HERNIA REPAIR HIATAL;  Surgeon: Gaynelle Adu, MD;  Location: [facility];  Service: General;  Laterality: N/A;   LAPAROSCOPIC SALPINGOOPHERECTOMY  01/14/2011   left   LAPAROSCOPY N/A 11/01/2022   Procedure: LAPAROSCOPY DIAGNOSTIC; LAPARSCOPIC CLOSURE OF PETERSEN SPACE;  Surgeon: Gaynelle Adu, MD;  Location: [facility];  Service: General;  Laterality: N/A;  DR Gerrit Friends TO ASSIST   PH IMPEDANCE STUDY N/A 01/12/2018   Procedure: PH  IMPEDANCE STUDY;  Surgeon: Napoleon Form, MD;  Location: [facility];  Service: Endoscopy;  Laterality: N/A;   RESECTION DISTAL CLAVICAL Right 09/25/2017   Procedure: RESECTION DISTAL CLAVICAL;  Surgeon: Bjorn Pippin, MD;  Location: [facility];  Service: Orthopedics;  Laterality: Right;   RIGHT OOPHORECTOMY  2011   with lysis of adhesions   SHOULDER ACROMIOPLASTY Right 09/25/2017   Procedure: SHOULDER ACROMIOPLASTY;  Surgeon: Bjorn Pippin, MD;  Location: [facility];  Service: Orthopedics;  Laterality: Right;   SHOULDER ARTHROSCOPY WITH DEBRIDEMENT AND BICEP TENDON REPAIR Right 09/25/2017   Procedure: SHOULDER ARTHROSCOPY WITH DEBRIDEMENT AND BICEP  TENDON REPAIR;  Surgeon: Bjorn Pippin, MD;  Location: [facility];  Service: Orthopedics;  Laterality: Right;   SHOULDER ARTHROSCOPY WITH ROTATOR CUFF REPAIR Right 09/25/2017   Procedure: SHOULDER ARTHROSCOPY WITH ROTATOR CUFF REPAIR;  Surgeon: Bjorn Pippin, MD;  Location:  SUR[facility]ERY [facility];  Service: Orthopedics;  Laterality: Right;   TONSILLECTOMY  as a child   TUBAL LIGATION  12/29/2000   UPPER GASTROINTESTINAL ENDOSCOPY     UPPER GI ENDOSCOPY N/A 11/01/2022   Procedure: UPPER GI ENDOSCOPY;  Surgeon: Gaynelle Adu, MD;  Location: [facility];  Service: General;  Laterality: N/A;   Patient Active Problem List   Diagnosis Date Noted   Class 1 obesity due to excess calories with serious comorbidity and body mass index (BMI) of 33.0 to 33.9 in adult 11/05/2022   COVID-19 vaccination declined 11/05/2022   Abdominal pain 08/21/2022   Anemia of pregnancy 08/21/2022   Chronic pelvic pain in female 08/21/2022   History of cesarean section 08/21/2022   Irregular intermenstrual bleeding 08/21/2022   S/P gastric bypass 03/19/2022   Microcytic anemia 12/14/2021   Iron deficiency anemia 12/14/2021   Adverse food reaction 03/21/2021   Depressive disorder 01/10/2021   Osteoporosis 01/10/2021   Thyroid nodule 01/10/2021   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   Ascites 10/05/2020   Muscle tension dysphonia 08/10/2020   Other insomnia 07/27/2020   At risk for side effect of medication 07/27/2020   Other hyperlipidemia 05/15/2020   At risk for osteoporosis 05/15/2020   At risk for impaired metabolic function 04/04/2020   Other fatigue 03/22/2020   SOBOE (shortness of breath on exertion) 03/22/2020   Exertional dyspnea 03/14/2020   Cyst of ovary 03/11/2020   Endometriosis 03/11/2020   Herpes simplex type 2 infection 03/11/2020   Neuropathy, right side of face 03/08/2020   At risk for depression 03/08/2020   Primary osteoarthritis of both feet 02/26/2020   Hyperlipidemia 01/26/2020    At risk for heart disease 01/26/2020   Daytime somnolence 01/26/2020   Abnormal glucose 01/26/2020   Vitamin D deficiency 01/04/2020   Fatty liver 01/04/2020   B12 deficiency 01/04/2020   Moderate persistent asthma without complication 12/31/2019   Inappropriate sinus tachycardia (HCC) 12/08/2019   Apnea 12/08/2019   Anaphylactic reaction due to food, subsequent encounter 11/22/2019   Idiopathic urticaria 11/22/2019   Generalized anxiety disorder 04/08/2018   Atypical chest pain    Regurgitation of food    Laryngopharyngeal reflux (LPR) 07/30/2017   ANA positive 12/30/2016   Gastroesophageal reflux disease 06/27/2016   Disturbance of skin sensation 04/27/2014   Dizziness and giddiness 03/14/2014   Headache, migraine 03/14/2014   Myalgia and myositis, unspecified 06/07/2013   Nausea and vomiting in adult 08/22/2012   Pelvic pain in female 12/18/2010   ANEMIA-IRON DEFICIENCY 10/15/2006   Genital herpes 09/30/2006   Obesity (BMI  35.0-39.9 without comorbidity) 09/30/2006   Nonorganic sleep disorder 09/30/2006   Constipation 09/30/2006   NEURITIS, LUMBOSACRAL NOS 09/30/2006   Personal history of mental disorder 09/30/2006    PCP: [name], FNP   REFERRING PROVIDER: Teryl Lucy, MD  REFERRING DIAG: CERVICALGIA WITH RADICULOPATHY  THERAPY DIAG:  Cervicalgia  Abnormal posture  Muscle weakness (generalized)  Rationale for Evaluation and Treatment: Rehabilitation  ONSET DATE: chronic  SUBJECTIVE:                                                                                                                                                                                                         SUBJECTIVE STATEMENT: Reports 8/10 R sided neck pain.  Symptoms localized to R SCM region  PERTINENT HISTORY:  HPI patient is a pleasant 47 year old female who comes in today with continued right shoulder and neck pain.  We have seen her several times for this.  She has had 2  previous right shoulder surgeries without significant relief of symptoms.  She has had subsequent subacromial and glenohumeral cortisone injections without much relief.  MRI of the cervical spine and shoulder were obtained back in August of this year.  Neck MRI showed minimal cervical spine changes without any disc bulge or spinal canal or neural foraminal narrowing.  Right shoulder MRI showed mild supraspinatus tendinopathy.  No other findings.  She has been seeing Dr. Titus Dubin as she is previously had a positive ANA with sicca symptoms.  She is currently being referred to [facility] rheumatology for further work-up.   Review of Systems as detailed in HPI.  All others reviewed and are negative.  PAIN:  Are you having pain? Yes: NPRS scale: 3/10 Pain location: R shoulder/neck Pain description: ache Aggravating factors: activity, OH reaching Relieving factors: rest  PRECAUTIONS: None  RED FLAGS: None     WEIGHT BEARING RESTRICTIONS: No  FALLS:  Has patient fallen in last 6 months? No  OCCUPATION: bus driver  PLOF: Independent  PATIENT GOALS: To manage my neck pain  NEXT MD VISIT: 11/24  OBJECTIVE:  Note: Objective measures were completed at Evaluation unless otherwise noted.  DIAGNOSTIC FINDINGS:  None available  PATIENT SURVEYS:  FOTO 43(53 predicted)  03/18/23 63  POSTURE:  elevated R shoulder  PALPATION: TTP R SCM, scalenes, UT and infraspinatus    CERVICAL ROM:   Active ROM A/PROM (deg) eval 03/10/23 03/18/23 04/16/23  Flexion 50% 50% 50% 50%  Extension 50% 50% 90% 90%  Right lateral flexion 90% 100% 100% 100%  Left lateral flexion 25% 75% 75% 75%  Right rotation 75% 90% 90% 90%  Left rotation 50% 75% 90% 75%   (Blank rows = not tested)  UPPER EXTREMITY ROM:  Active ROM Right eval Left eval 03/10/23 03/18/23  Shoulder flexion 160 170 160d 160d  Shoulder extension      Shoulder abduction 150 170 160d 160d  Shoulder adduction      Shoulder extension       Shoulder internal rotation [facility] WFL    Shoulder external rotation [facility] WFL    Elbow flexion      Elbow extension      Wrist flexion      Wrist extension      Wrist ulnar deviation      Wrist radial deviation      Wrist pronation      Wrist supination       (Blank rows = not tested)  UPPER EXTREMITY MMT: WFL throughout  MMT Right eval Left eval  Shoulder flexion    Shoulder extension    Shoulder abduction    Shoulder adduction    Shoulder extension    Shoulder internal rotation    Shoulder external rotation    Middle trapezius    Lower trapezius    Elbow flexion    Elbow extension    Wrist flexion    Wrist extension    Wrist ulnar deviation    Wrist radial deviation    Wrist pronation    Wrist supination    Grip strength     (Blank rows = not tested)  CERVICAL SPECIAL TESTS:  Neck flexor muscle endurance test: Positive 15s hold due to weakness; 03/10/23 27s  FUNCTIONAL TESTS:  5 times sit to stand: 12s  TODAY'S TREATMENT:    [facility] Adult PT Treatment:                                                DATE: 05/01/23 Therapeutic Exercise: Nustep L4 8 min Manual Therapy: Skilled palpation to identify taught and irritable bands in R SCM Trigger Point Dry Needling Treatment: Pre-treatment instruction: Patient instructed on dry needling rationale, procedures, and possible side effects including pain during treatment (achy,cramping feeling), bruising, drop of blood, lightheadedness, nausea, sweating. Patient Consent Given: Yes Education handout provided: Yes previously Muscles treated: R SCM  Needle size and number: .25x61mm x 1, distal and .30x60mm x1 , caudal, threading technique applied Electrical stimulation performed: No Parameters: N/A Treatment response/outcome:  No palpable twitch response elicited Post-treatment instructions: Patient instructed to expect possible mild to moderate muscle soreness later today and/or tomorrow. Patient instructed in methods to reduce  muscle soreness and to continue prescribed HEP. If patient was dry needled over the lung field, patient was instructed on signs and symptoms of pneumothorax and, however unlikely, to see immediate medical attention should they occur. Patient was also educated on signs and symptoms of infection and to seek medical attention should they occur. Patient verbalized understanding of these instructions and education.   Manual R scalene stretch 30s x3  [facility] Adult PT Treatment:                                                DATE: 04/10/23 Therapeutic Exercise: UBE lvl 1.0 x 3 min while  taking subjective Supine dow flexion 2x10 Supine horizontal abd 2x10 GTB Supine chin tuck 2x10 Supine serratus raise 2x10 YTB Seated bilateral ER 3x10 GTB Corner stretch 2x30"  FM row 2x10 13# FM shoulder ext 2x10 13# Seated low row 2x10 25# Seated lat pulldown 2x10 25#  [facility] Adult PT Treatment:                                                DATE: 04/01/23 Therapeutic Exercise: UBE lvl 1.0 x 3 min while taking subjective Seated low row 2x10 25# Seated lat pulldown 2x10 25# Supine horizontal abd 2x15 GTB Supine serratus raise 2x10 YTB Prone row 2x10 3# each Prone ext 2x10 3# each Manual Therapy: Skilled palpation to identify taught and irritable bands in R teres Major, R upper trap, R posterior deltoid Positional release to R upper trap Suboccipital release Trigger Point Dry Needling Treatment: Pre-treatment instruction: Patient instructed on dry needling rationale, procedures, and possible side effects including pain during treatment (achy,cramping feeling), bruising, drop of blood, lightheadedness, nausea, sweating. Patient Consent Given: Yes Education handout provided: No Muscles treated: R posterior deltoid, R upper trap Needle size and number: .30x64mm x 2 Electrical stimulation performed: No Parameters: N/A Treatment response/outcome: Palpable decrease in muscle tension Post-treatment instructions:  Patient instructed to expect possible mild to moderate muscle soreness later today and/or tomorrow. Patient instructed in methods to reduce muscle soreness and to continue prescribed HEP. If patient was dry needled over the lung field, patient was instructed on signs and symptoms of pneumothorax and, however unlikely, to see immediate medical attention should they occur. Patient was also educated on signs and symptoms of infection and to seek medical attention should they occur. Patient verbalized understanding of these instructions and education.    [facility] Adult PT Treatment:                                                DATE: 03/25/23 Therapeutic Exercise: Prone flexion/row/extension/hor abd/scaption 3# Manual Therapy: Skilled palpation to identify taught and irritable bands in R teres Major Trigger Point Dry Needling Treatment: Pre-treatment instruction: Patient instructed on dry needling rationale, procedures, and possible side effects including pain during treatment (achy,cramping feeling), bruising, drop of blood, lightheadedness, nausea, sweating. Patient Consent Given: Yes Education handout provided: No Muscles treated: R Teres major Needle size and number: .25x61mm x 1 Electrical stimulation performed: No Parameters: N/A Treatment response/outcome: Palpable decrease in muscle tension Post-treatment instructions: Patient instructed to expect possible mild to moderate muscle soreness later today and/or tomorrow. Patient instructed in methods to reduce muscle soreness and to continue prescribed HEP. If patient was dry needled over the lung field, patient was instructed on signs and symptoms of pneumothorax and, however unlikely, to see immediate medical attention should they occur. Patient was also educated on signs and symptoms of infection and to seek medical attention should they occur. Patient verbalized understanding of these instructions and education.    [facility] Adult PT Treatment:  DATE: 03/20/23 Therapeutic Exercise: Nustep L4 6 min Omega row 25# high/low grip 15/15 Omega lat pulldown 25# 15x 4 way scapula 15x ea. IR stretch on stabilized scapula. Manual Therapy: Skilled palpation to identify taught and irritable bands in R teres Major Trigger Point Dry Needling Treatment: Pre-treatment instruction: Patient instructed on dry needling rationale, procedures, and possible side effects including pain during treatment (achy,cramping feeling), bruising, drop of blood, lightheadedness, nausea, sweating. Patient Consent Given: Yes Education handout provided: No Muscles treated: R Teres minor  Needle size and number: .30x63mm x 1 and .25x56mm x 1 Electrical stimulation performed: No Parameters: N/A Treatment response/outcome: Palpable decrease in muscle tension Post-treatment instructions: Patient instructed to expect possible mild to moderate muscle soreness later today and/or tomorrow. Patient instructed in methods to reduce muscle soreness and to continue prescribed HEP. If patient was dry needled over the lung field, patient was instructed on signs and symptoms of pneumothorax and, however unlikely, to see immediate medical attention should they occur. Patient was also educated on signs and symptoms of infection and to seek medical attention should they occur. Patient verbalized understanding of these instructions and education.    PATIENT EDUCATION:  Education details: continue HEP Person educated: Patient Education method: Explanation Education comprehension: verbalized understanding and needs further education  HOME EXERCISE PROGRAM: Access Code: [email or web address] URL: [email or web address] Date: 03/18/2023 Prepared by: Gustavus Bryant  Exercises - Shoulder External Rotation and Scapular Retraction with Resistance  - 2 x daily - 5 x weekly - 2 sets - 15 reps - Standing Shoulder Horizontal Abduction with Resistance  - 2  x daily - 5 x weekly - 2 sets - 15 reps - Scaption Wall Slide with Towel  - 2 x daily - 5 x weekly - 2 sets - 15 reps - Standing Shoulder Scaption with Resistance  - 2 x daily - 5 x weekly - 2 sets - 15 reps - Sternocleidomastoid Stretch  - 2 x daily - 5 x weekly - 1 sets - 2 reps - 30s hold  ASSESSMENT:  CLINICAL IMPRESSION: Todays session focused on R cervical issues including TPDN to R SCM. Unable to elicit a twitch response or discern a palpable decrease in tone.  Manual stretching of R scalene group performed.  R sided neck pain suggestive of potential torticollis based on limited response from TPDN and lack of distinctive palpable knots.  OBJECTIVE IMPAIRMENTS: decreased activity tolerance, decreased knowledge of condition, decreased mobility, decreased ROM, decreased strength, impaired UE functional use, postural dysfunction, and pain.   ACTIVITY LIMITATIONS: carrying, lifting, and reach over head  PERSONAL FACTORS: Fitness, Time since onset of injury/illness/exacerbation, and 1 comorbidity: R RC pathology  are also affecting patient's functional outcome.   REHAB POTENTIAL: Good  CLINICAL DECISION MAKING: Stable/uncomplicated  EVALUATION COMPLEXITY: Low   GOALS: Goals reviewed with patient? No  SHORT TERM GOALS: Target date: 03/18/2023  Patient to demonstrate independence in HEP  Baseline:  [id number] Goal status: Met  2.  Increase L lateral flexion to 50% Baseline:  Active ROM A/PROM (deg) eval 03/10/23 03/18/23 04/16/23  Flexion 50% 50% 50% 50%  Extension 50% 50% 90% 90%  Right lateral flexion 90% 100% 100% 100%  Left lateral flexion 25% 75% 75% 75%  Right rotation 75% 90% 90% 90%  Left rotation 50% 75% 90% 75%   Goal status: Met   LONG TERM GOALS: Target date: 05/18/2023  Patient will score at least 53% on FOTO to signify clinically meaningful improvement in functional abilities.   Baseline: 43%;  03/18/23 63% Goal status: Met  2.  Increase cervical ROM to 75%  throughout Baseline:  Active ROM A/PROM (deg) eval 03/10/23 03/18/23 04/16/23  Flexion 50% 50% 50% 50%  Extension 50% 50% 90% 90%  Right lateral flexion 90% 100% 100% 100%  Left lateral flexion 25% 75% 75% 75%  Right rotation 75% 90% 90% 90%  Left rotation 50% 75% 90% 75%   Goal status: Ongoing in flexion only  3.  Decrease worst pain to 6/10 Baseline: 8/10; 03/18/23 5/10 Goal status: Met  4.  Decrease tenderness in R UT, SCM, infraspinatus and scalene groups to minimal Baseline: moderate; 04/16/23 continued tightness in R SCM Goal status: Ongoing   PLAN:  PT FREQUENCY: 1x/week  PT DURATION: 4 weeks  PLANNED INTERVENTIONS: 97164- PT Re-evaluation, 97110-Therapeutic exercises, 97530- Therapeutic activity, 97112- Neuromuscular re-education, 97535- Self Care, 16109- Manual therapy, 97116- Gait training, 97014- Electrical stimulation (unattended), Patient/Family education, Balance training, Dry Needling, Joint mobilization, Spinal mobilization, Cryotherapy, and Moist heat  PLAN FOR NEXT SESSION: HEP review and update, manual techniques as appropriate, aerobic tasks, ROM and flexibility activities, strengthening and PREs, TPDN, gait and balance training as needed   For all possible CPT codes, reference the Planned Interventions line above.     Check all conditions that are expected to impact treatment: {Conditions expected to impact treatment:Complications related to surgery   If treatment provided at initial evaluation, no treatment charged due to lack of authorization.       Hildred Laser, PT 05/01/2023, 5:02 PM

## 2023-05-02 DIAGNOSIS — R350 Frequency of micturition: Secondary | ICD-10-CM | POA: Diagnosis not present

## 2023-05-02 DIAGNOSIS — N3020 Other chronic cystitis without hematuria: Secondary | ICD-10-CM | POA: Diagnosis not present

## 2023-05-02 DIAGNOSIS — N3946 Mixed incontinence: Secondary | ICD-10-CM | POA: Diagnosis not present

## 2023-05-05 DIAGNOSIS — F411 Generalized anxiety disorder: Secondary | ICD-10-CM | POA: Diagnosis not present

## 2023-05-05 DIAGNOSIS — F330 Major depressive disorder, recurrent, mild: Secondary | ICD-10-CM | POA: Diagnosis not present

## 2023-05-08 ENCOUNTER — Ambulatory Visit: Payer: Medicaid Other

## 2023-05-08 NOTE — Therapy (Deleted)
 OUTPATIENT PHYSICAL THERAPY TREATMENT NOTE   Patient Name: [name] MRN: [id number] DOB:28-Feb-1976, 48 y.o., female Today's Date: 05/08/2023  END OF SESSION:      Past Medical History:  Diagnosis Date   Allergy    Anemia    Anxiety    Apnea 12/08/2019   Arthritis    Asthma    Back pain    Constipation    Exertional dyspnea 03/14/2020   Fatty liver    GERD (gastroesophageal reflux disease)    H/O: hysterectomy    Hx of endometriosis    Inappropriate sinus tachycardia (HCC) 12/08/2019   Insomnia    Joint pain    Lactose intolerance    Migraine    Morbid obesity (HCC) 12/08/2019   Osteoporosis    Pneumonia    Pre-diabetes    Urticaria    Vitamin D  deficiency    Past Surgical History:  Procedure Laterality Date   41 HOUR PH STUDY N/A 01/12/2018   Procedure: 24 HOUR PH STUDY;  Surgeon: Shila Gustav GAILS, MD;  Location: [facility];  Service: Endoscopy;  Laterality: N/A;   ABDOMINAL HYSTERECTOMY  12/12/2003   ADENOIDECTOMY  05/2011   ANKLE ARTHROSCOPY  12/29/2007   right; with extensive debridement   BLADDER NECK RECONSTRUCTION  01/14/2011   Procedure: BLADDER NECK REPAIR;  Surgeon: Glenys GORMAN Birk, MD;  Location: [facility];  Service: Gynecology;  Laterality: N/A;  Laparoscopic Repair of Incidental Cystotomy   BREAST REDUCTION SURGERY  08/05/2011   Procedure: MAMMARY REDUCTION  (BREAST);  Surgeon: Elna Pick, MD;  Location: [facility];  Service: Plastics;  Laterality: Bilateral;  bilateral   CESAREAN SECTION  12/29/2000; 1994   CHOLECYSTECTOMY     age 64   DILATION AND CURETTAGE OF UTERUS  07/08/2003   open laparoscopy   ESOPHAGEAL MANOMETRY N/A 01/12/2018   Procedure: ESOPHAGEAL MANOMETRY (EM);  Surgeon: Shila Gustav GAILS, MD;  Location: [facility];  Service: Endoscopy;  Laterality: N/A;   GASTRIC ROUX-EN-Y N/A 03/19/2022   Procedure: LAPAROSCOPIC ROUX-EN-Y GASTRIC BYPASS WITH UPPER ENDOSCOPY;  Surgeon: Tanda Locus, MD;  Location:  [facility];  Service: General;  Laterality: N/A;   GIVENS CAPSULE STUDY N/A 06/23/2017   Procedure: GIVENS CAPSULE STUDY;  Surgeon: Leigh Elspeth SQUIBB, MD;  Location: [facility];  Service: Gastroenterology;  Laterality: N/A;   HIATAL HERNIA REPAIR N/A 03/19/2022   Procedure: HERNIA REPAIR HIATAL;  Surgeon: Tanda Locus, MD;  Location: [facility];  Service: General;  Laterality: N/A;   LAPAROSCOPIC SALPINGOOPHERECTOMY  01/14/2011   left   LAPAROSCOPY N/A 11/01/2022   Procedure: LAPAROSCOPY DIAGNOSTIC; LAPARSCOPIC CLOSURE OF PETERSEN SPACE;  Surgeon: Tanda Locus, MD;  Location: [facility];  Service: General;  Laterality: N/A;  DR ELETHA TO ASSIST   PH IMPEDANCE STUDY N/A 01/12/2018   Procedure: PH IMPEDANCE STUDY;  Surgeon: Shila Gustav GAILS, MD;  Location: [facility];  Service: Endoscopy;  Laterality: N/A;   RESECTION DISTAL CLAVICAL Right 09/25/2017   Procedure: RESECTION DISTAL CLAVICAL;  Surgeon: Cristy Bonner DASEN, MD;  Location: [facility];  Service: Orthopedics;  Laterality: Right;   RIGHT OOPHORECTOMY  2011   with lysis of adhesions   SHOULDER ACROMIOPLASTY Right 09/25/2017   Procedure: SHOULDER ACROMIOPLASTY;  Surgeon: Cristy Bonner DASEN, MD;  Location: [facility];  Service: Orthopedics;  Laterality: Right;   SHOULDER ARTHROSCOPY WITH DEBRIDEMENT AND BICEP TENDON REPAIR Right 09/25/2017   Procedure: SHOULDER ARTHROSCOPY WITH DEBRIDEMENT AND BICEP TENDON REPAIR;  Surgeon: Cristy Bonner DASEN,  MD;  Location: [facility];  Service: Orthopedics;  Laterality: Right;   SHOULDER ARTHROSCOPY WITH ROTATOR CUFF REPAIR Right 09/25/2017   Procedure: SHOULDER ARTHROSCOPY WITH ROTATOR CUFF REPAIR;  Surgeon: Cristy Bonner DASEN, MD;  Location: [facility];  Service: Orthopedics;  Laterality: Right;   TONSILLECTOMY  as a child   TUBAL LIGATION  12/29/2000   UPPER GASTROINTESTINAL ENDOSCOPY     UPPER GI ENDOSCOPY N/A 11/01/2022   Procedure: UPPER GI ENDOSCOPY;  Surgeon:  Tanda Locus, MD;  Location: [facility];  Service: General;  Laterality: N/A;   Patient Active Problem List   Diagnosis Date Noted   Class 1 obesity due to excess calories with serious comorbidity and body mass index (BMI) of 33.0 to 33.9 in adult 11/05/2022   COVID-19 vaccination declined 11/05/2022   Abdominal pain 08/21/2022   Anemia of pregnancy 08/21/2022   Chronic pelvic pain in female 08/21/2022   History of cesarean section 08/21/2022   Irregular intermenstrual bleeding 08/21/2022   S/P gastric bypass 03/19/2022   Microcytic anemia 12/14/2021   Iron  deficiency anemia 12/14/2021   Adverse food reaction 03/21/2021   Depressive disorder 01/10/2021   Osteoporosis 01/10/2021   Thyroid  nodule 01/10/2021   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   Ascites 10/05/2020   Muscle tension dysphonia 08/10/2020   Other insomnia 07/27/2020   At risk for side effect of medication 07/27/2020   Other hyperlipidemia 05/15/2020   At risk for osteoporosis 05/15/2020   At risk for impaired metabolic function 04/04/2020   Other fatigue 03/22/2020   SOBOE (shortness of breath on exertion) 03/22/2020   Exertional dyspnea 03/14/2020   Cyst of ovary 03/11/2020   Endometriosis 03/11/2020   Herpes simplex type 2 infection 03/11/2020   Neuropathy, right side of face 03/08/2020   At risk for depression 03/08/2020   Primary osteoarthritis of both feet 02/26/2020   Hyperlipidemia 01/26/2020   At risk for heart disease 01/26/2020   Daytime somnolence 01/26/2020   Abnormal glucose 01/26/2020   Vitamin D  deficiency 01/04/2020   Fatty liver 01/04/2020   B12 deficiency 01/04/2020   Moderate persistent asthma without complication 12/31/2019   Inappropriate sinus tachycardia (HCC) 12/08/2019   Apnea 12/08/2019   Anaphylactic reaction due to food, subsequent encounter 11/22/2019   Idiopathic urticaria 11/22/2019   Generalized anxiety disorder 04/08/2018   Atypical chest pain    Regurgitation of food     Laryngopharyngeal reflux (LPR) 07/30/2017   ANA positive 12/30/2016   Gastroesophageal reflux disease 06/27/2016   Disturbance of skin sensation 04/27/2014   Dizziness and giddiness 03/14/2014   Headache, migraine 03/14/2014   Myalgia and myositis, unspecified 06/07/2013   Nausea and vomiting in adult 08/22/2012   Pelvic pain in female 12/18/2010   ANEMIA-IRON  DEFICIENCY 10/15/2006   Genital herpes 09/30/2006   Obesity (BMI 35.0-39.9 without comorbidity) 09/30/2006   Nonorganic sleep disorder 09/30/2006   Constipation 09/30/2006   NEURITIS, LUMBOSACRAL NOS 09/30/2006   Personal history of mental disorder 09/30/2006    PCP: Georgina Speaks, FNP   REFERRING PROVIDER: Josefina Chew, MD  REFERRING DIAG: CERVICALGIA WITH RADICULOPATHY  THERAPY DIAG:  No diagnosis found.  Rationale for Evaluation and Treatment: Rehabilitation  ONSET DATE: chronic  SUBJECTIVE:  SUBJECTIVE STATEMENT: Reports 8/10 R sided neck pain.  Symptoms localized to R SCM region  PERTINENT HISTORY:  HPI patient is a pleasant 48 year old female who comes in today with continued right shoulder and neck pain.  We have seen her several times for this.  She has had 2 previous right shoulder surgeries without significant relief of symptoms.  She has had subsequent subacromial and glenohumeral cortisone injections without much relief.  MRI of the cervical spine and shoulder were obtained back in August of this year.  Neck MRI showed minimal cervical spine changes without any disc bulge or spinal canal or neural foraminal narrowing.  Right shoulder MRI showed mild supraspinatus tendinopathy.  No other findings.  She has been seeing Dr. Monna as she is previously had a positive ANA with sicca symptoms.  She is currently being  referred to [facility] rheumatology for further work-up.   Review of Systems as detailed in HPI.  All others reviewed and are negative.  PAIN:  Are you having pain? Yes: NPRS scale: 3/10 Pain location: R shoulder/neck Pain description: ache Aggravating factors: activity, OH reaching Relieving factors: rest  PRECAUTIONS: None  RED FLAGS: None     WEIGHT BEARING RESTRICTIONS: No  FALLS:  Has patient fallen in last 6 months? No  OCCUPATION: bus driver  PLOF: Independent  PATIENT GOALS: To manage my neck pain  NEXT MD VISIT: 11/24  OBJECTIVE:  Note: Objective measures were completed at Evaluation unless otherwise noted.  DIAGNOSTIC FINDINGS:  None available  PATIENT SURVEYS:  FOTO 43(53 predicted)  03/18/23 63  POSTURE:  elevated R shoulder  PALPATION: TTP R SCM, scalenes, UT and infraspinatus    CERVICAL ROM:   Active ROM A/PROM (deg) eval 03/10/23 03/18/23 04/16/23  Flexion 50% 50% 50% 50%  Extension 50% 50% 90% 90%  Right lateral flexion 90% 100% 100% 100%  Left lateral flexion 25% 75% 75% 75%  Right rotation 75% 90% 90% 90%  Left rotation 50% 75% 90% 75%   (Blank rows = not tested)  UPPER EXTREMITY ROM:  Active ROM Right eval Left eval 03/10/23 03/18/23  Shoulder flexion 160 170 160d 160d  Shoulder extension      Shoulder abduction 150 170 160d 160d  Shoulder adduction      Shoulder extension      Shoulder internal rotation [facility] WFL    Shoulder external rotation [facility] WFL    Elbow flexion      Elbow extension      Wrist flexion      Wrist extension      Wrist ulnar deviation      Wrist radial deviation      Wrist pronation      Wrist supination       (Blank rows = not tested)  UPPER EXTREMITY MMT: WFL throughout  MMT Right eval Left eval  Shoulder flexion    Shoulder extension    Shoulder abduction    Shoulder adduction    Shoulder extension    Shoulder internal rotation    Shoulder external rotation    Middle trapezius    Lower trapezius     Elbow flexion    Elbow extension    Wrist flexion    Wrist extension    Wrist ulnar deviation    Wrist radial deviation    Wrist pronation    Wrist supination    Grip strength     (Blank rows = not tested)  CERVICAL SPECIAL TESTS:  Neck flexor muscle endurance test:  Positive 15s hold due to weakness; 03/10/23 27s  FUNCTIONAL TESTS:  5 times sit to stand: 12s  TODAY'S TREATMENT:    [facility] Adult PT Treatment:                                                DATE: 05/01/23 Therapeutic Exercise: Nustep L4 8 min Manual Therapy: Skilled palpation to identify taught and irritable bands in R SCM Trigger Point Dry Needling Treatment: Pre-treatment instruction: Patient instructed on dry needling rationale, procedures, and possible side effects including pain during treatment (achy,cramping feeling), bruising, drop of blood, lightheadedness, nausea, sweating. Patient Consent Given: Yes Education handout provided: Yes previously Muscles treated: R SCM  Needle size and number: .25x79mm x 1, distal and .30x70mm x1 , caudal, threading technique applied Electrical stimulation performed: No Parameters: N/A Treatment response/outcome:  No palpable twitch response elicited Post-treatment instructions: Patient instructed to expect possible mild to moderate muscle soreness later today and/or tomorrow. Patient instructed in methods to reduce muscle soreness and to continue prescribed HEP. If patient was dry needled over the lung field, patient was instructed on signs and symptoms of pneumothorax and, however unlikely, to see immediate medical attention should they occur. Patient was also educated on signs and symptoms of infection and to seek medical attention should they occur. Patient verbalized understanding of these instructions and education.   Manual R scalene stretch 30s x3  [facility] Adult PT Treatment:                                                DATE: 04/10/23 Therapeutic Exercise: UBE lvl 1.0 x  3 min while taking subjective Supine dow flexion 2x10 Supine horizontal abd 2x10 GTB Supine chin tuck 2x10 Supine serratus raise 2x10 YTB Seated bilateral ER 3x10 GTB Corner stretch 2x30  FM row 2x10 13# FM shoulder ext 2x10 13# Seated low row 2x10 25# Seated lat pulldown 2x10 25#  [facility] Adult PT Treatment:                                                DATE: 04/01/23 Therapeutic Exercise: UBE lvl 1.0 x 3 min while taking subjective Seated low row 2x10 25# Seated lat pulldown 2x10 25# Supine horizontal abd 2x15 GTB Supine serratus raise 2x10 YTB Prone row 2x10 3# each Prone ext 2x10 3# each Manual Therapy: Skilled palpation to identify taught and irritable bands in R teres Major, R upper trap, R posterior deltoid Positional release to R upper trap Suboccipital release Trigger Point Dry Needling Treatment: Pre-treatment instruction: Patient instructed on dry needling rationale, procedures, and possible side effects including pain during treatment (achy,cramping feeling), bruising, drop of blood, lightheadedness, nausea, sweating. Patient Consent Given: Yes Education handout provided: No Muscles treated: R posterior deltoid, R upper trap Needle size and number: .30x38mm x 2 Electrical stimulation performed: No Parameters: N/A Treatment response/outcome: Palpable decrease in muscle tension Post-treatment instructions: Patient instructed to expect possible mild to moderate muscle soreness later today and/or tomorrow. Patient instructed in methods to reduce muscle soreness and to continue prescribed HEP. If patient was dry needled over  the lung field, patient was instructed on signs and symptoms of pneumothorax and, however unlikely, to see immediate medical attention should they occur. Patient was also educated on signs and symptoms of infection and to seek medical attention should they occur. Patient verbalized understanding of these instructions and education.    [facility] Adult PT  Treatment:                                                DATE: 03/25/23 Therapeutic Exercise: Prone flexion/row/extension/hor abd/scaption 3# Manual Therapy: Skilled palpation to identify taught and irritable bands in R teres Major Trigger Point Dry Needling Treatment: Pre-treatment instruction: Patient instructed on dry needling rationale, procedures, and possible side effects including pain during treatment (achy,cramping feeling), bruising, drop of blood, lightheadedness, nausea, sweating. Patient Consent Given: Yes Education handout provided: No Muscles treated: R Teres major Needle size and number: .25x60mm x 1 Electrical stimulation performed: No Parameters: N/A Treatment response/outcome: Palpable decrease in muscle tension Post-treatment instructions: Patient instructed to expect possible mild to moderate muscle soreness later today and/or tomorrow. Patient instructed in methods to reduce muscle soreness and to continue prescribed HEP. If patient was dry needled over the lung field, patient was instructed on signs and symptoms of pneumothorax and, however unlikely, to see immediate medical attention should they occur. Patient was also educated on signs and symptoms of infection and to seek medical attention should they occur. Patient verbalized understanding of these instructions and education.    [facility] Adult PT Treatment:                                                DATE: 03/20/23 Therapeutic Exercise: Nustep L4 6 min Omega row 25# high/low grip 15/15 Omega lat pulldown 25# 15x 4 way scapula 15x ea. IR stretch on stabilized scapula. Manual Therapy: Skilled palpation to identify taught and irritable bands in R teres Major Trigger Point Dry Needling Treatment: Pre-treatment instruction: Patient instructed on dry needling rationale, procedures, and possible side effects including pain during treatment (achy,cramping feeling), bruising, drop of blood, lightheadedness, nausea,  sweating. Patient Consent Given: Yes Education handout provided: No Muscles treated: R Teres minor  Needle size and number: .30x16mm x 1 and .25x67mm x 1 Electrical stimulation performed: No Parameters: N/A Treatment response/outcome: Palpable decrease in muscle tension Post-treatment instructions: Patient instructed to expect possible mild to moderate muscle soreness later today and/or tomorrow. Patient instructed in methods to reduce muscle soreness and to continue prescribed HEP. If patient was dry needled over the lung field, patient was instructed on signs and symptoms of pneumothorax and, however unlikely, to see immediate medical attention should they occur. Patient was also educated on signs and symptoms of infection and to seek medical attention should they occur. Patient verbalized understanding of these instructions and education.    PATIENT EDUCATION:  Education details: continue HEP Person educated: Patient Education method: Explanation Education comprehension: verbalized understanding and needs further education  HOME EXERCISE PROGRAM: Access Code: [email or web address] URL: [email or web address] Date: 03/18/2023 Prepared by: Brityn Mastrogiovanni  Exercises - Shoulder External Rotation and Scapular Retraction with Resistance  - 2 x daily - 5 x weekly - 2 sets - 15 reps - Standing Shoulder Horizontal Abduction with Resistance  - 2  x daily - 5 x weekly - 2 sets - 15 reps - Scaption Wall Slide with Towel  - 2 x daily - 5 x weekly - 2 sets - 15 reps - Standing Shoulder Scaption with Resistance  - 2 x daily - 5 x weekly - 2 sets - 15 reps - Sternocleidomastoid Stretch  - 2 x daily - 5 x weekly - 1 sets - 2 reps - 30s hold  ASSESSMENT:  CLINICAL IMPRESSION: Todays session focused on R cervical issues including TPDN to R SCM. Unable to elicit a twitch response or discern a palpable decrease in tone.  Manual stretching of R scalene group performed.  R sided neck pain suggestive of  potential torticollis based on limited response from TPDN and lack of distinctive palpable knots.  OBJECTIVE IMPAIRMENTS: decreased activity tolerance, decreased knowledge of condition, decreased mobility, decreased ROM, decreased strength, impaired UE functional use, postural dysfunction, and pain.   ACTIVITY LIMITATIONS: carrying, lifting, and reach over head  PERSONAL FACTORS: Fitness, Time since onset of injury/illness/exacerbation, and 1 comorbidity: R RC pathology  are also affecting patient's functional outcome.   REHAB POTENTIAL: Good  CLINICAL DECISION MAKING: Stable/uncomplicated  EVALUATION COMPLEXITY: Low   GOALS: Goals reviewed with patient? No  SHORT TERM GOALS: Target date: 03/18/2023  Patient to demonstrate independence in HEP  Baseline:  [id number] Goal status: Met  2.  Increase L lateral flexion to 50% Baseline:  Active ROM A/PROM (deg) eval 03/10/23 03/18/23 04/16/23  Flexion 50% 50% 50% 50%  Extension 50% 50% 90% 90%  Right lateral flexion 90% 100% 100% 100%  Left lateral flexion 25% 75% 75% 75%  Right rotation 75% 90% 90% 90%  Left rotation 50% 75% 90% 75%   Goal status: Met   LONG TERM GOALS: Target date: 05/18/2023  Patient will score at least 53% on FOTO to signify clinically meaningful improvement in functional abilities.   Baseline: 43%; 03/18/23 63% Goal status: Met  2.  Increase cervical ROM to 75% throughout Baseline:  Active ROM A/PROM (deg) eval 03/10/23 03/18/23 04/16/23  Flexion 50% 50% 50% 50%  Extension 50% 50% 90% 90%  Right lateral flexion 90% 100% 100% 100%  Left lateral flexion 25% 75% 75% 75%  Right rotation 75% 90% 90% 90%  Left rotation 50% 75% 90% 75%   Goal status: Ongoing in flexion only  3.  Decrease worst pain to 6/10 Baseline: 8/10; 03/18/23 5/10 Goal status: Met  4.  Decrease tenderness in R UT, SCM, infraspinatus and scalene groups to minimal Baseline: moderate; 04/16/23 continued tightness in R SCM Goal  status: Ongoing   PLAN:  PT FREQUENCY: 1x/week  PT DURATION: 4 weeks  PLANNED INTERVENTIONS: 97164- PT Re-evaluation, 97110-Therapeutic exercises, 97530- Therapeutic activity, 97112- Neuromuscular re-education, 97535- Self Care, [id number]- Manual therapy, 97116- Gait training, 97014- Electrical stimulation (unattended), Patient/Family education, Balance training, Dry Needling, Joint mobilization, Spinal mobilization, Cryotherapy, and Moist heat  PLAN FOR NEXT SESSION: HEP review and update, manual techniques as appropriate, aerobic tasks, ROM and flexibility activities, strengthening and PREs, TPDN, gait and balance training as needed   For all possible CPT codes, reference the Planned Interventions line above.     Check all conditions that are expected to impact treatment: {Conditions expected to impact treatment:Complications related to surgery   If treatment provided at initial evaluation, no treatment charged due to lack of authorization.       Zackrey Dyar M Eryck Negron, PT 05/08/2023, 12:03 PM

## 2023-05-08 NOTE — Therapy (Deleted)
 OUTPATIENT PHYSICAL THERAPY TREATMENT NOTE   Patient Name: [name] MRN: [id number] DOB:11/06/1975, 48 y.o., female Today's Date: 05/08/2023  END OF SESSION:      Past Medical History:  Diagnosis Date   Allergy    Anemia    Anxiety    Apnea 12/08/2019   Arthritis    Asthma    Back pain    Constipation    Exertional dyspnea 03/14/2020   Fatty liver    GERD (gastroesophageal reflux disease)    H/O: hysterectomy    Hx of endometriosis    Inappropriate sinus tachycardia (HCC) 12/08/2019   Insomnia    Joint pain    Lactose intolerance    Migraine    Morbid obesity (HCC) 12/08/2019   Osteoporosis    Pneumonia    Pre-diabetes    Urticaria    Vitamin D  deficiency    Past Surgical History:  Procedure Laterality Date   28 HOUR PH STUDY N/A 01/12/2018   Procedure: 24 HOUR PH STUDY;  Surgeon: Shila Gustav GAILS, MD;  Location: [facility];  Service: Endoscopy;  Laterality: N/A;   ABDOMINAL HYSTERECTOMY  12/12/2003   ADENOIDECTOMY  05/2011   ANKLE ARTHROSCOPY  12/29/2007   right; with extensive debridement   BLADDER NECK RECONSTRUCTION  01/14/2011   Procedure: BLADDER NECK REPAIR;  Surgeon: Glenys GORMAN Birk, MD;  Location: [facility];  Service: Gynecology;  Laterality: N/A;  Laparoscopic Repair of Incidental Cystotomy   BREAST REDUCTION SURGERY  08/05/2011   Procedure: MAMMARY REDUCTION  (BREAST);  Surgeon: Elna Pick, MD;  Location: [facility];  Service: Plastics;  Laterality: Bilateral;  bilateral   CESAREAN SECTION  12/29/2000; 1994   CHOLECYSTECTOMY     age 71   DILATION AND CURETTAGE OF UTERUS  07/08/2003   open laparoscopy   ESOPHAGEAL MANOMETRY N/A 01/12/2018   Procedure: ESOPHAGEAL MANOMETRY (EM);  Surgeon: Shila Gustav GAILS, MD;  Location: [facility];  Service: Endoscopy;  Laterality: N/A;   GASTRIC ROUX-EN-Y N/A 03/19/2022   Procedure: LAPAROSCOPIC ROUX-EN-Y GASTRIC BYPASS WITH UPPER ENDOSCOPY;  Surgeon: Tanda Locus, MD;  Location:  [facility];  Service: General;  Laterality: N/A;   GIVENS CAPSULE STUDY N/A 06/23/2017   Procedure: GIVENS CAPSULE STUDY;  Surgeon: Leigh Elspeth SQUIBB, MD;  Location: [facility];  Service: Gastroenterology;  Laterality: N/A;   HIATAL HERNIA REPAIR N/A 03/19/2022   Procedure: HERNIA REPAIR HIATAL;  Surgeon: Tanda Locus, MD;  Location: [facility];  Service: General;  Laterality: N/A;   LAPAROSCOPIC SALPINGOOPHERECTOMY  01/14/2011   left   LAPAROSCOPY N/A 11/01/2022   Procedure: LAPAROSCOPY DIAGNOSTIC; LAPARSCOPIC CLOSURE OF PETERSEN SPACE;  Surgeon: Tanda Locus, MD;  Location: [facility];  Service: General;  Laterality: N/A;  DR ELETHA TO ASSIST   PH IMPEDANCE STUDY N/A 01/12/2018   Procedure: PH IMPEDANCE STUDY;  Surgeon: Shila Gustav GAILS, MD;  Location: [facility];  Service: Endoscopy;  Laterality: N/A;   RESECTION DISTAL CLAVICAL Right 09/25/2017   Procedure: RESECTION DISTAL CLAVICAL;  Surgeon: Cristy Bonner DASEN, MD;  Location: [facility];  Service: Orthopedics;  Laterality: Right;   RIGHT OOPHORECTOMY  2011   with lysis of adhesions   SHOULDER ACROMIOPLASTY Right 09/25/2017   Procedure: SHOULDER ACROMIOPLASTY;  Surgeon: Cristy Bonner DASEN, MD;  Location:  SURGERY CENTER;  Service: Orthopedics;  Laterality: Right;   SHOULDER ARTHROSCOPY WITH DEBRIDEMENT AND BICEP TENDON REPAIR Right 09/25/2017   Procedure: SHOULDER ARTHROSCOPY WITH DEBRIDEMENT AND BICEP TENDON REPAIR;  Surgeon: Cristy Bonner DASEN,  MD;  Location: [facility];  Service: Orthopedics;  Laterality: Right;   SHOULDER ARTHROSCOPY WITH ROTATOR CUFF REPAIR Right 09/25/2017   Procedure: SHOULDER ARTHROSCOPY WITH ROTATOR CUFF REPAIR;  Surgeon: Cristy Bonner DASEN, MD;  Location: [facility];  Service: Orthopedics;  Laterality: Right;   TONSILLECTOMY  as a child   TUBAL LIGATION  12/29/2000   UPPER GASTROINTESTINAL ENDOSCOPY     UPPER GI ENDOSCOPY N/A 11/01/2022   Procedure: UPPER GI ENDOSCOPY;  Surgeon:  Tanda Locus, MD;  Location: [facility];  Service: General;  Laterality: N/A;   Patient Active Problem List   Diagnosis Date Noted   Class 1 obesity due to excess calories with serious comorbidity and body mass index (BMI) of 33.0 to 33.9 in adult 11/05/2022   COVID-19 vaccination declined 11/05/2022   Abdominal pain 08/21/2022   Anemia of pregnancy 08/21/2022   Chronic pelvic pain in female 08/21/2022   History of cesarean section 08/21/2022   Irregular intermenstrual bleeding 08/21/2022   S/P gastric bypass 03/19/2022   Microcytic anemia 12/14/2021   Iron  deficiency anemia 12/14/2021   Adverse food reaction 03/21/2021   Depressive disorder 01/10/2021   Osteoporosis 01/10/2021   Thyroid  nodule 01/10/2021   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   Ascites 10/05/2020   Muscle tension dysphonia 08/10/2020   Other insomnia 07/27/2020   At risk for side effect of medication 07/27/2020   Other hyperlipidemia 05/15/2020   At risk for osteoporosis 05/15/2020   At risk for impaired metabolic function 04/04/2020   Other fatigue 03/22/2020   SOBOE (shortness of breath on exertion) 03/22/2020   Exertional dyspnea 03/14/2020   Cyst of ovary 03/11/2020   Endometriosis 03/11/2020   Herpes simplex type 2 infection 03/11/2020   Neuropathy, right side of face 03/08/2020   At risk for depression 03/08/2020   Primary osteoarthritis of both feet 02/26/2020   Hyperlipidemia 01/26/2020   At risk for heart disease 01/26/2020   Daytime somnolence 01/26/2020   Abnormal glucose 01/26/2020   Vitamin D  deficiency 01/04/2020   Fatty liver 01/04/2020   B12 deficiency 01/04/2020   Moderate persistent asthma without complication 12/31/2019   Inappropriate sinus tachycardia (HCC) 12/08/2019   Apnea 12/08/2019   Anaphylactic reaction due to food, subsequent encounter 11/22/2019   Idiopathic urticaria 11/22/2019   Generalized anxiety disorder 04/08/2018   Atypical chest pain    Regurgitation of food     Laryngopharyngeal reflux (LPR) 07/30/2017   ANA positive 12/30/2016   Gastroesophageal reflux disease 06/27/2016   Disturbance of skin sensation 04/27/2014   Dizziness and giddiness 03/14/2014   Headache, migraine 03/14/2014   Myalgia and myositis, unspecified 06/07/2013   Nausea and vomiting in adult 08/22/2012   Pelvic pain in female 12/18/2010   ANEMIA-IRON  DEFICIENCY 10/15/2006   Genital herpes 09/30/2006   Obesity (BMI 35.0-39.9 without comorbidity) 09/30/2006   Nonorganic sleep disorder 09/30/2006   Constipation 09/30/2006   NEURITIS, LUMBOSACRAL NOS 09/30/2006   Personal history of mental disorder 09/30/2006    PCP: Georgina Speaks, FNP   REFERRING PROVIDER: Josefina Chew, MD  REFERRING DIAG: CERVICALGIA WITH RADICULOPATHY  THERAPY DIAG:  No diagnosis found.  Rationale for Evaluation and Treatment: Rehabilitation  ONSET DATE: chronic  SUBJECTIVE:  SUBJECTIVE STATEMENT: Reports 8/10 R sided neck pain.  Symptoms localized to R SCM region  PERTINENT HISTORY:  HPI patient is a pleasant 48 year old female who comes in today with continued right shoulder and neck pain.  We have seen her several times for this.  She has had 2 previous right shoulder surgeries without significant relief of symptoms.  She has had subsequent subacromial and glenohumeral cortisone injections without much relief.  MRI of the cervical spine and shoulder were obtained back in August of this year.  Neck MRI showed minimal cervical spine changes without any disc bulge or spinal canal or neural foraminal narrowing.  Right shoulder MRI showed mild supraspinatus tendinopathy.  No other findings.  She has been seeing Dr. Monna as she is previously had a positive ANA with sicca symptoms.  She is currently being  referred to [facility] rheumatology for further work-up.   Review of Systems as detailed in HPI.  All others reviewed and are negative.  PAIN:  Are you having pain? Yes: NPRS scale: 3/10 Pain location: R shoulder/neck Pain description: ache Aggravating factors: activity, OH reaching Relieving factors: rest  PRECAUTIONS: None  RED FLAGS: None     WEIGHT BEARING RESTRICTIONS: No  FALLS:  Has patient fallen in last 6 months? No  OCCUPATION: bus driver  PLOF: Independent  PATIENT GOALS: To manage my neck pain  NEXT MD VISIT: 11/24  OBJECTIVE:  Note: Objective measures were completed at Evaluation unless otherwise noted.  DIAGNOSTIC FINDINGS:  None available  PATIENT SURVEYS:  FOTO 43(53 predicted)  03/18/23 63  POSTURE:  elevated R shoulder  PALPATION: TTP R SCM, scalenes, UT and infraspinatus    CERVICAL ROM:   Active ROM A/PROM (deg) eval 03/10/23 03/18/23 04/16/23  Flexion 50% 50% 50% 50%  Extension 50% 50% 90% 90%  Right lateral flexion 90% 100% 100% 100%  Left lateral flexion 25% 75% 75% 75%  Right rotation 75% 90% 90% 90%  Left rotation 50% 75% 90% 75%   (Blank rows = not tested)  UPPER EXTREMITY ROM:  Active ROM Right eval Left eval 03/10/23 03/18/23  Shoulder flexion 160 170 160d 160d  Shoulder extension      Shoulder abduction 150 170 160d 160d  Shoulder adduction      Shoulder extension      Shoulder internal rotation [facility] WFL    Shoulder external rotation [facility] WFL    Elbow flexion      Elbow extension      Wrist flexion      Wrist extension      Wrist ulnar deviation      Wrist radial deviation      Wrist pronation      Wrist supination       (Blank rows = not tested)  UPPER EXTREMITY MMT: WFL throughout  MMT Right eval Left eval  Shoulder flexion    Shoulder extension    Shoulder abduction    Shoulder adduction    Shoulder extension    Shoulder internal rotation    Shoulder external rotation    Middle trapezius    Lower trapezius     Elbow flexion    Elbow extension    Wrist flexion    Wrist extension    Wrist ulnar deviation    Wrist radial deviation    Wrist pronation    Wrist supination    Grip strength     (Blank rows = not tested)  CERVICAL SPECIAL TESTS:  Neck flexor muscle endurance test:  Positive 15s hold due to weakness; 03/10/23 27s  FUNCTIONAL TESTS:  5 times sit to stand: 12s  TODAY'S TREATMENT:    [facility] Adult PT Treatment:                                                DATE: 05/01/23 Therapeutic Exercise: Nustep L4 8 min Manual Therapy: Skilled palpation to identify taught and irritable bands in R SCM Trigger Point Dry Needling Treatment: Pre-treatment instruction: Patient instructed on dry needling rationale, procedures, and possible side effects including pain during treatment (achy,cramping feeling), bruising, drop of blood, lightheadedness, nausea, sweating. Patient Consent Given: Yes Education handout provided: Yes previously Muscles treated: R SCM  Needle size and number: .25x39mm x 1, distal and .30x35mm x1 , caudal, threading technique applied Electrical stimulation performed: No Parameters: N/A Treatment response/outcome:  No palpable twitch response elicited Post-treatment instructions: Patient instructed to expect possible mild to moderate muscle soreness later today and/or tomorrow. Patient instructed in methods to reduce muscle soreness and to continue prescribed HEP. If patient was dry needled over the lung field, patient was instructed on signs and symptoms of pneumothorax and, however unlikely, to see immediate medical attention should they occur. Patient was also educated on signs and symptoms of infection and to seek medical attention should they occur. Patient verbalized understanding of these instructions and education.   Manual R scalene stretch 30s x3  [facility] Adult PT Treatment:                                                DATE: 04/10/23 Therapeutic Exercise: UBE lvl 1.0 x  3 min while taking subjective Supine dow flexion 2x10 Supine horizontal abd 2x10 GTB Supine chin tuck 2x10 Supine serratus raise 2x10 YTB Seated bilateral ER 3x10 GTB Corner stretch 2x30  FM row 2x10 13# FM shoulder ext 2x10 13# Seated low row 2x10 25# Seated lat pulldown 2x10 25#  [facility] Adult PT Treatment:                                                DATE: 04/01/23 Therapeutic Exercise: UBE lvl 1.0 x 3 min while taking subjective Seated low row 2x10 25# Seated lat pulldown 2x10 25# Supine horizontal abd 2x15 GTB Supine serratus raise 2x10 YTB Prone row 2x10 3# each Prone ext 2x10 3# each Manual Therapy: Skilled palpation to identify taught and irritable bands in R teres Major, R upper trap, R posterior deltoid Positional release to R upper trap Suboccipital release Trigger Point Dry Needling Treatment: Pre-treatment instruction: Patient instructed on dry needling rationale, procedures, and possible side effects including pain during treatment (achy,cramping feeling), bruising, drop of blood, lightheadedness, nausea, sweating. Patient Consent Given: Yes Education handout provided: No Muscles treated: R posterior deltoid, R upper trap Needle size and number: .30x51mm x 2 Electrical stimulation performed: No Parameters: N/A Treatment response/outcome: Palpable decrease in muscle tension Post-treatment instructions: Patient instructed to expect possible mild to moderate muscle soreness later today and/or tomorrow. Patient instructed in methods to reduce muscle soreness and to continue prescribed HEP. If patient was dry needled over  the lung field, patient was instructed on signs and symptoms of pneumothorax and, however unlikely, to see immediate medical attention should they occur. Patient was also educated on signs and symptoms of infection and to seek medical attention should they occur. Patient verbalized understanding of these instructions and education.    [facility] Adult PT  Treatment:                                                DATE: 03/25/23 Therapeutic Exercise: Prone flexion/row/extension/hor abd/scaption 3# Manual Therapy: Skilled palpation to identify taught and irritable bands in R teres Major Trigger Point Dry Needling Treatment: Pre-treatment instruction: Patient instructed on dry needling rationale, procedures, and possible side effects including pain during treatment (achy,cramping feeling), bruising, drop of blood, lightheadedness, nausea, sweating. Patient Consent Given: Yes Education handout provided: No Muscles treated: R Teres major Needle size and number: .25x36mm x 1 Electrical stimulation performed: No Parameters: N/A Treatment response/outcome: Palpable decrease in muscle tension Post-treatment instructions: Patient instructed to expect possible mild to moderate muscle soreness later today and/or tomorrow. Patient instructed in methods to reduce muscle soreness and to continue prescribed HEP. If patient was dry needled over the lung field, patient was instructed on signs and symptoms of pneumothorax and, however unlikely, to see immediate medical attention should they occur. Patient was also educated on signs and symptoms of infection and to seek medical attention should they occur. Patient verbalized understanding of these instructions and education.    [facility] Adult PT Treatment:                                                DATE: 03/20/23 Therapeutic Exercise: Nustep L4 6 min Omega row 25# high/low grip 15/15 Omega lat pulldown 25# 15x 4 way scapula 15x ea. IR stretch on stabilized scapula. Manual Therapy: Skilled palpation to identify taught and irritable bands in R teres Major Trigger Point Dry Needling Treatment: Pre-treatment instruction: Patient instructed on dry needling rationale, procedures, and possible side effects including pain during treatment (achy,cramping feeling), bruising, drop of blood, lightheadedness, nausea,  sweating. Patient Consent Given: Yes Education handout provided: No Muscles treated: R Teres minor  Needle size and number: .30x6mm x 1 and .25x33mm x 1 Electrical stimulation performed: No Parameters: N/A Treatment response/outcome: Palpable decrease in muscle tension Post-treatment instructions: Patient instructed to expect possible mild to moderate muscle soreness later today and/or tomorrow. Patient instructed in methods to reduce muscle soreness and to continue prescribed HEP. If patient was dry needled over the lung field, patient was instructed on signs and symptoms of pneumothorax and, however unlikely, to see immediate medical attention should they occur. Patient was also educated on signs and symptoms of infection and to seek medical attention should they occur. Patient verbalized understanding of these instructions and education.    PATIENT EDUCATION:  Education details: continue HEP Person educated: Patient Education method: Explanation Education comprehension: verbalized understanding and needs further education  HOME EXERCISE PROGRAM: Access Code: [email or web address] URL: [email or web address] Date: 03/18/2023 Prepared by: Loudon Krakow  Exercises - Shoulder External Rotation and Scapular Retraction with Resistance  - 2 x daily - 5 x weekly - 2 sets - 15 reps - Standing Shoulder Horizontal Abduction with Resistance  - 2  x daily - 5 x weekly - 2 sets - 15 reps - Scaption Wall Slide with Towel  - 2 x daily - 5 x weekly - 2 sets - 15 reps - Standing Shoulder Scaption with Resistance  - 2 x daily - 5 x weekly - 2 sets - 15 reps - Sternocleidomastoid Stretch  - 2 x daily - 5 x weekly - 1 sets - 2 reps - 30s hold  ASSESSMENT:  CLINICAL IMPRESSION: Todays session focused on R cervical issues including TPDN to R SCM. Unable to elicit a twitch response or discern a palpable decrease in tone.  Manual stretching of R scalene group performed.  R sided neck pain suggestive of  potential torticollis based on limited response from TPDN and lack of distinctive palpable knots.  OBJECTIVE IMPAIRMENTS: decreased activity tolerance, decreased knowledge of condition, decreased mobility, decreased ROM, decreased strength, impaired UE functional use, postural dysfunction, and pain.   ACTIVITY LIMITATIONS: carrying, lifting, and reach over head  PERSONAL FACTORS: Fitness, Time since onset of injury/illness/exacerbation, and 1 comorbidity: R RC pathology  are also affecting patient's functional outcome.   REHAB POTENTIAL: Good  CLINICAL DECISION MAKING: Stable/uncomplicated  EVALUATION COMPLEXITY: Low   GOALS: Goals reviewed with patient? No  SHORT TERM GOALS: Target date: 03/18/2023  Patient to demonstrate independence in HEP  Baseline:  [id number] Goal status: Met  2.  Increase L lateral flexion to 50% Baseline:  Active ROM A/PROM (deg) eval 03/10/23 03/18/23 04/16/23  Flexion 50% 50% 50% 50%  Extension 50% 50% 90% 90%  Right lateral flexion 90% 100% 100% 100%  Left lateral flexion 25% 75% 75% 75%  Right rotation 75% 90% 90% 90%  Left rotation 50% 75% 90% 75%   Goal status: Met   LONG TERM GOALS: Target date: 05/18/2023  Patient will score at least 53% on FOTO to signify clinically meaningful improvement in functional abilities.   Baseline: 43%; 03/18/23 63% Goal status: Met  2.  Increase cervical ROM to 75% throughout Baseline:  Active ROM A/PROM (deg) eval 03/10/23 03/18/23 04/16/23  Flexion 50% 50% 50% 50%  Extension 50% 50% 90% 90%  Right lateral flexion 90% 100% 100% 100%  Left lateral flexion 25% 75% 75% 75%  Right rotation 75% 90% 90% 90%  Left rotation 50% 75% 90% 75%   Goal status: Ongoing in flexion only  3.  Decrease worst pain to 6/10 Baseline: 8/10; 03/18/23 5/10 Goal status: Met  4.  Decrease tenderness in R UT, SCM, infraspinatus and scalene groups to minimal Baseline: moderate; 04/16/23 continued tightness in R SCM Goal  status: Ongoing   PLAN:  PT FREQUENCY: 1x/week  PT DURATION: 4 weeks  PLANNED INTERVENTIONS: 97164- PT Re-evaluation, 97110-Therapeutic exercises, 97530- Therapeutic activity, 97112- Neuromuscular re-education, 97535- Self Care, [id number]- Manual therapy, 97116- Gait training, 97014- Electrical stimulation (unattended), Patient/Family education, Balance training, Dry Needling, Joint mobilization, Spinal mobilization, Cryotherapy, and Moist heat  PLAN FOR NEXT SESSION: HEP review and update, manual techniques as appropriate, aerobic tasks, ROM and flexibility activities, strengthening and PREs, TPDN, gait and balance training as needed   For all possible CPT codes, reference the Planned Interventions line above.     Check all conditions that are expected to impact treatment: {Conditions expected to impact treatment:Complications related to surgery   If treatment provided at initial evaluation, no treatment charged due to lack of authorization.       Dayrin Stallone M Jenavie Stanczak, PT 05/08/2023, 2:52 PM

## 2023-05-09 NOTE — Therapy (Deleted)
 OUTPATIENT PHYSICAL THERAPY TREATMENT NOTE   Patient Name: [name] MRN: [id number] DOB:1976-01-22, 48 y.o., female Today's Date: 05/09/2023  END OF SESSION:      Past Medical History:  Diagnosis Date   Allergy    Anemia    Anxiety    Apnea 12/08/2019   Arthritis    Asthma    Back pain    Constipation    Exertional dyspnea 03/14/2020   Fatty liver    GERD (gastroesophageal reflux disease)    H/O: hysterectomy    Hx of endometriosis    Inappropriate sinus tachycardia (HCC) 12/08/2019   Insomnia    Joint pain    Lactose intolerance    Migraine    Morbid obesity (HCC) 12/08/2019   Osteoporosis    Pneumonia    Pre-diabetes    Urticaria    Vitamin D  deficiency    Past Surgical History:  Procedure Laterality Date   75 HOUR PH STUDY N/A 01/12/2018   Procedure: 24 HOUR PH STUDY;  Surgeon: Shila Gustav GAILS, MD;  Location: [facility];  Service: Endoscopy;  Laterality: N/A;   ABDOMINAL HYSTERECTOMY  12/12/2003   ADENOIDECTOMY  05/2011   ANKLE ARTHROSCOPY  12/29/2007   right; with extensive debridement   BLADDER NECK RECONSTRUCTION  01/14/2011   Procedure: BLADDER NECK REPAIR;  Surgeon: Glenys GORMAN Birk, MD;  Location: [facility];  Service: Gynecology;  Laterality: N/A;  Laparoscopic Repair of Incidental Cystotomy   BREAST REDUCTION SURGERY  08/05/2011   Procedure: MAMMARY REDUCTION  (BREAST);  Surgeon: Elna Pick, MD;  Location: [facility];  Service: Plastics;  Laterality: Bilateral;  bilateral   CESAREAN SECTION  12/29/2000; 1994   CHOLECYSTECTOMY     age 22   DILATION AND CURETTAGE OF UTERUS  07/08/2003   open laparoscopy   ESOPHAGEAL MANOMETRY N/A 01/12/2018   Procedure: ESOPHAGEAL MANOMETRY (EM);  Surgeon: Shila Gustav GAILS, MD;  Location: [facility];  Service: Endoscopy;  Laterality: N/A;   GASTRIC ROUX-EN-Y N/A 03/19/2022   Procedure: LAPAROSCOPIC ROUX-EN-Y GASTRIC BYPASS WITH UPPER ENDOSCOPY;  Surgeon: Tanda Locus, MD;  Location:  [facility];  Service: General;  Laterality: N/A;   GIVENS CAPSULE STUDY N/A 06/23/2017   Procedure: GIVENS CAPSULE STUDY;  Surgeon: Leigh Elspeth SQUIBB, MD;  Location: [facility];  Service: Gastroenterology;  Laterality: N/A;   HIATAL HERNIA REPAIR N/A 03/19/2022   Procedure: HERNIA REPAIR HIATAL;  Surgeon: Tanda Locus, MD;  Location: [facility];  Service: General;  Laterality: N/A;   LAPAROSCOPIC SALPINGOOPHERECTOMY  01/14/2011   left   LAPAROSCOPY N/A 11/01/2022   Procedure: LAPAROSCOPY DIAGNOSTIC; LAPARSCOPIC CLOSURE OF PETERSEN SPACE;  Surgeon: Tanda Locus, MD;  Location: [facility];  Service: General;  Laterality: N/A;  DR ELETHA TO ASSIST   PH IMPEDANCE STUDY N/A 01/12/2018   Procedure: PH IMPEDANCE STUDY;  Surgeon: Shila Gustav GAILS, MD;  Location: [facility];  Service: Endoscopy;  Laterality: N/A;   RESECTION DISTAL CLAVICAL Right 09/25/2017   Procedure: RESECTION DISTAL CLAVICAL;  Surgeon: Cristy Bonner DASEN, MD;  Location: [facility];  Service: Orthopedics;  Laterality: Right;   RIGHT OOPHORECTOMY  2011   with lysis of adhesions   SHOULDER ACROMIOPLASTY Right 09/25/2017   Procedure: SHOULDER ACROMIOPLASTY;  Surgeon: Cristy Bonner DASEN, MD;  Location:  SURGERY CENTER;  Service: Orthopedics;  Laterality: Right;   SHOULDER ARTHROSCOPY WITH DEBRIDEMENT AND BICEP TENDON REPAIR Right 09/25/2017   Procedure: SHOULDER ARTHROSCOPY WITH DEBRIDEMENT AND BICEP TENDON REPAIR;  Surgeon: Cristy Bonner DASEN,  MD;  Location: [facility];  Service: Orthopedics;  Laterality: Right;   SHOULDER ARTHROSCOPY WITH ROTATOR CUFF REPAIR Right 09/25/2017   Procedure: SHOULDER ARTHROSCOPY WITH ROTATOR CUFF REPAIR;  Surgeon: Cristy Bonner DASEN, MD;  Location: [facility];  Service: Orthopedics;  Laterality: Right;   TONSILLECTOMY  as a child   TUBAL LIGATION  12/29/2000   UPPER GASTROINTESTINAL ENDOSCOPY     UPPER GI ENDOSCOPY N/A 11/01/2022   Procedure: UPPER GI ENDOSCOPY;  Surgeon:  Tanda Locus, MD;  Location: [facility];  Service: General;  Laterality: N/A;   Patient Active Problem List   Diagnosis Date Noted   Class 1 obesity due to excess calories with serious comorbidity and body mass index (BMI) of 33.0 to 33.9 in adult 11/05/2022   COVID-19 vaccination declined 11/05/2022   Abdominal pain 08/21/2022   Anemia of pregnancy 08/21/2022   Chronic pelvic pain in female 08/21/2022   History of cesarean section 08/21/2022   Irregular intermenstrual bleeding 08/21/2022   S/P gastric bypass 03/19/2022   Microcytic anemia 12/14/2021   Iron  deficiency anemia 12/14/2021   Adverse food reaction 03/21/2021   Depressive disorder 01/10/2021   Osteoporosis 01/10/2021   Thyroid  nodule 01/10/2021   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   Ascites 10/05/2020   Muscle tension dysphonia 08/10/2020   Other insomnia 07/27/2020   At risk for side effect of medication 07/27/2020   Other hyperlipidemia 05/15/2020   At risk for osteoporosis 05/15/2020   At risk for impaired metabolic function 04/04/2020   Other fatigue 03/22/2020   SOBOE (shortness of breath on exertion) 03/22/2020   Exertional dyspnea 03/14/2020   Cyst of ovary 03/11/2020   Endometriosis 03/11/2020   Herpes simplex type 2 infection 03/11/2020   Neuropathy, right side of face 03/08/2020   At risk for depression 03/08/2020   Primary osteoarthritis of both feet 02/26/2020   Hyperlipidemia 01/26/2020   At risk for heart disease 01/26/2020   Daytime somnolence 01/26/2020   Abnormal glucose 01/26/2020   Vitamin D  deficiency 01/04/2020   Fatty liver 01/04/2020   B12 deficiency 01/04/2020   Moderate persistent asthma without complication 12/31/2019   Inappropriate sinus tachycardia (HCC) 12/08/2019   Apnea 12/08/2019   Anaphylactic reaction due to food, subsequent encounter 11/22/2019   Idiopathic urticaria 11/22/2019   Generalized anxiety disorder 04/08/2018   Atypical chest pain    Regurgitation of food     Laryngopharyngeal reflux (LPR) 07/30/2017   ANA positive 12/30/2016   Gastroesophageal reflux disease 06/27/2016   Disturbance of skin sensation 04/27/2014   Dizziness and giddiness 03/14/2014   Headache, migraine 03/14/2014   Myalgia and myositis, unspecified 06/07/2013   Nausea and vomiting in adult 08/22/2012   Pelvic pain in female 12/18/2010   ANEMIA-IRON  DEFICIENCY 10/15/2006   Genital herpes 09/30/2006   Obesity (BMI 35.0-39.9 without comorbidity) 09/30/2006   Nonorganic sleep disorder 09/30/2006   Constipation 09/30/2006   NEURITIS, LUMBOSACRAL NOS 09/30/2006   Personal history of mental disorder 09/30/2006    PCP: Georgina Speaks, FNP   REFERRING PROVIDER: Josefina Chew, MD  REFERRING DIAG: CERVICALGIA WITH RADICULOPATHY  THERAPY DIAG:  No diagnosis found.  Rationale for Evaluation and Treatment: Rehabilitation  ONSET DATE: chronic  SUBJECTIVE:  SUBJECTIVE STATEMENT: Reports 8/10 R sided neck pain.  Symptoms localized to R SCM region  PERTINENT HISTORY:  HPI patient is a pleasant 48 year old female who comes in today with continued right shoulder and neck pain.  We have seen her several times for this.  She has had 2 previous right shoulder surgeries without significant relief of symptoms.  She has had subsequent subacromial and glenohumeral cortisone injections without much relief.  MRI of the cervical spine and shoulder were obtained back in August of this year.  Neck MRI showed minimal cervical spine changes without any disc bulge or spinal canal or neural foraminal narrowing.  Right shoulder MRI showed mild supraspinatus tendinopathy.  No other findings.  She has been seeing Dr. Monna as she is previously had a positive ANA with sicca symptoms.  She is currently being  referred to [facility] rheumatology for further work-up.   Review of Systems as detailed in HPI.  All others reviewed and are negative.  PAIN:  Are you having pain? Yes: NPRS scale: 3/10 Pain location: R shoulder/neck Pain description: ache Aggravating factors: activity, OH reaching Relieving factors: rest  PRECAUTIONS: None  RED FLAGS: None     WEIGHT BEARING RESTRICTIONS: No  FALLS:  Has patient fallen in last 6 months? No  OCCUPATION: bus driver  PLOF: Independent  PATIENT GOALS: To manage my neck pain  NEXT MD VISIT: 11/24  OBJECTIVE:  Note: Objective measures were completed at Evaluation unless otherwise noted.  DIAGNOSTIC FINDINGS:  None available  PATIENT SURVEYS:  FOTO 43(53 predicted)  03/18/23 63  POSTURE:  elevated R shoulder  PALPATION: TTP R SCM, scalenes, UT and infraspinatus    CERVICAL ROM:   Active ROM A/PROM (deg) eval 03/10/23 03/18/23 04/16/23  Flexion 50% 50% 50% 50%  Extension 50% 50% 90% 90%  Right lateral flexion 90% 100% 100% 100%  Left lateral flexion 25% 75% 75% 75%  Right rotation 75% 90% 90% 90%  Left rotation 50% 75% 90% 75%   (Blank rows = not tested)  UPPER EXTREMITY ROM:  Active ROM Right eval Left eval 03/10/23 03/18/23  Shoulder flexion 160 170 160d 160d  Shoulder extension      Shoulder abduction 150 170 160d 160d  Shoulder adduction      Shoulder extension      Shoulder internal rotation [facility] WFL    Shoulder external rotation [facility] WFL    Elbow flexion      Elbow extension      Wrist flexion      Wrist extension      Wrist ulnar deviation      Wrist radial deviation      Wrist pronation      Wrist supination       (Blank rows = not tested)  UPPER EXTREMITY MMT: WFL throughout  MMT Right eval Left eval  Shoulder flexion    Shoulder extension    Shoulder abduction    Shoulder adduction    Shoulder extension    Shoulder internal rotation    Shoulder external rotation    Middle trapezius    Lower trapezius     Elbow flexion    Elbow extension    Wrist flexion    Wrist extension    Wrist ulnar deviation    Wrist radial deviation    Wrist pronation    Wrist supination    Grip strength     (Blank rows = not tested)  CERVICAL SPECIAL TESTS:  Neck flexor muscle endurance test:  Positive 15s hold due to weakness; 03/10/23 27s  FUNCTIONAL TESTS:  5 times sit to stand: 12s  TODAY'S TREATMENT:    [facility] Adult PT Treatment:                                                DATE: 05/01/23 Therapeutic Exercise: Nustep L4 8 min Manual Therapy: Skilled palpation to identify taught and irritable bands in R SCM Trigger Point Dry Needling Treatment: Pre-treatment instruction: Patient instructed on dry needling rationale, procedures, and possible side effects including pain during treatment (achy,cramping feeling), bruising, drop of blood, lightheadedness, nausea, sweating. Patient Consent Given: Yes Education handout provided: Yes previously Muscles treated: R SCM  Needle size and number: .25x24mm x 1, distal and .30x60mm x1 , caudal, threading technique applied Electrical stimulation performed: No Parameters: N/A Treatment response/outcome:  No palpable twitch response elicited Post-treatment instructions: Patient instructed to expect possible mild to moderate muscle soreness later today and/or tomorrow. Patient instructed in methods to reduce muscle soreness and to continue prescribed HEP. If patient was dry needled over the lung field, patient was instructed on signs and symptoms of pneumothorax and, however unlikely, to see immediate medical attention should they occur. Patient was also educated on signs and symptoms of infection and to seek medical attention should they occur. Patient verbalized understanding of these instructions and education.   Manual R scalene stretch 30s x3  [facility] Adult PT Treatment:                                                DATE: 04/10/23 Therapeutic Exercise: UBE lvl 1.0 x  3 min while taking subjective Supine dow flexion 2x10 Supine horizontal abd 2x10 GTB Supine chin tuck 2x10 Supine serratus raise 2x10 YTB Seated bilateral ER 3x10 GTB Corner stretch 2x30  FM row 2x10 13# FM shoulder ext 2x10 13# Seated low row 2x10 25# Seated lat pulldown 2x10 25#  [facility] Adult PT Treatment:                                                DATE: 04/01/23 Therapeutic Exercise: UBE lvl 1.0 x 3 min while taking subjective Seated low row 2x10 25# Seated lat pulldown 2x10 25# Supine horizontal abd 2x15 GTB Supine serratus raise 2x10 YTB Prone row 2x10 3# each Prone ext 2x10 3# each Manual Therapy: Skilled palpation to identify taught and irritable bands in R teres Major, R upper trap, R posterior deltoid Positional release to R upper trap Suboccipital release Trigger Point Dry Needling Treatment: Pre-treatment instruction: Patient instructed on dry needling rationale, procedures, and possible side effects including pain during treatment (achy,cramping feeling), bruising, drop of blood, lightheadedness, nausea, sweating. Patient Consent Given: Yes Education handout provided: No Muscles treated: R posterior deltoid, R upper trap Needle size and number: .30x88mm x 2 Electrical stimulation performed: No Parameters: N/A Treatment response/outcome: Palpable decrease in muscle tension Post-treatment instructions: Patient instructed to expect possible mild to moderate muscle soreness later today and/or tomorrow. Patient instructed in methods to reduce muscle soreness and to continue prescribed HEP. If patient was dry needled over  the lung field, patient was instructed on signs and symptoms of pneumothorax and, however unlikely, to see immediate medical attention should they occur. Patient was also educated on signs and symptoms of infection and to seek medical attention should they occur. Patient verbalized understanding of these instructions and education.    [facility] Adult PT  Treatment:                                                DATE: 03/25/23 Therapeutic Exercise: Prone flexion/row/extension/hor abd/scaption 3# Manual Therapy: Skilled palpation to identify taught and irritable bands in R teres Major Trigger Point Dry Needling Treatment: Pre-treatment instruction: Patient instructed on dry needling rationale, procedures, and possible side effects including pain during treatment (achy,cramping feeling), bruising, drop of blood, lightheadedness, nausea, sweating. Patient Consent Given: Yes Education handout provided: No Muscles treated: R Teres major Needle size and number: .25x68mm x 1 Electrical stimulation performed: No Parameters: N/A Treatment response/outcome: Palpable decrease in muscle tension Post-treatment instructions: Patient instructed to expect possible mild to moderate muscle soreness later today and/or tomorrow. Patient instructed in methods to reduce muscle soreness and to continue prescribed HEP. If patient was dry needled over the lung field, patient was instructed on signs and symptoms of pneumothorax and, however unlikely, to see immediate medical attention should they occur. Patient was also educated on signs and symptoms of infection and to seek medical attention should they occur. Patient verbalized understanding of these instructions and education.    [facility] Adult PT Treatment:                                                DATE: 03/20/23 Therapeutic Exercise: Nustep L4 6 min Omega row 25# high/low grip 15/15 Omega lat pulldown 25# 15x 4 way scapula 15x ea. IR stretch on stabilized scapula. Manual Therapy: Skilled palpation to identify taught and irritable bands in R teres Major Trigger Point Dry Needling Treatment: Pre-treatment instruction: Patient instructed on dry needling rationale, procedures, and possible side effects including pain during treatment (achy,cramping feeling), bruising, drop of blood, lightheadedness, nausea,  sweating. Patient Consent Given: Yes Education handout provided: No Muscles treated: R Teres minor  Needle size and number: .30x87mm x 1 and .25x73mm x 1 Electrical stimulation performed: No Parameters: N/A Treatment response/outcome: Palpable decrease in muscle tension Post-treatment instructions: Patient instructed to expect possible mild to moderate muscle soreness later today and/or tomorrow. Patient instructed in methods to reduce muscle soreness and to continue prescribed HEP. If patient was dry needled over the lung field, patient was instructed on signs and symptoms of pneumothorax and, however unlikely, to see immediate medical attention should they occur. Patient was also educated on signs and symptoms of infection and to seek medical attention should they occur. Patient verbalized understanding of these instructions and education.    PATIENT EDUCATION:  Education details: continue HEP Person educated: Patient Education method: Explanation Education comprehension: verbalized understanding and needs further education  HOME EXERCISE PROGRAM: Access Code: [email or web address] URL: [email or web address] Date: 03/18/2023 Prepared by: Priscillia Fouch  Exercises - Shoulder External Rotation and Scapular Retraction with Resistance  - 2 x daily - 5 x weekly - 2 sets - 15 reps - Standing Shoulder Horizontal Abduction with Resistance  - 2  x daily - 5 x weekly - 2 sets - 15 reps - Scaption Wall Slide with Towel  - 2 x daily - 5 x weekly - 2 sets - 15 reps - Standing Shoulder Scaption with Resistance  - 2 x daily - 5 x weekly - 2 sets - 15 reps - Sternocleidomastoid Stretch  - 2 x daily - 5 x weekly - 1 sets - 2 reps - 30s hold  ASSESSMENT:  CLINICAL IMPRESSION: Todays session focused on R cervical issues including TPDN to R SCM. Unable to elicit a twitch response or discern a palpable decrease in tone.  Manual stretching of R scalene group performed.  R sided neck pain suggestive of  potential torticollis based on limited response from TPDN and lack of distinctive palpable knots.  OBJECTIVE IMPAIRMENTS: decreased activity tolerance, decreased knowledge of condition, decreased mobility, decreased ROM, decreased strength, impaired UE functional use, postural dysfunction, and pain.   ACTIVITY LIMITATIONS: carrying, lifting, and reach over head  PERSONAL FACTORS: Fitness, Time since onset of injury/illness/exacerbation, and 1 comorbidity: R RC pathology  are also affecting patient's functional outcome.   REHAB POTENTIAL: Good  CLINICAL DECISION MAKING: Stable/uncomplicated  EVALUATION COMPLEXITY: Low   GOALS: Goals reviewed with patient? No  SHORT TERM GOALS: Target date: 03/18/2023  Patient to demonstrate independence in HEP  Baseline:  [id number] Goal status: Met  2.  Increase L lateral flexion to 50% Baseline:  Active ROM A/PROM (deg) eval 03/10/23 03/18/23 04/16/23  Flexion 50% 50% 50% 50%  Extension 50% 50% 90% 90%  Right lateral flexion 90% 100% 100% 100%  Left lateral flexion 25% 75% 75% 75%  Right rotation 75% 90% 90% 90%  Left rotation 50% 75% 90% 75%   Goal status: Met   LONG TERM GOALS: Target date: 05/18/2023  Patient will score at least 53% on FOTO to signify clinically meaningful improvement in functional abilities.   Baseline: 43%; 03/18/23 63% Goal status: Met  2.  Increase cervical ROM to 75% throughout Baseline:  Active ROM A/PROM (deg) eval 03/10/23 03/18/23 04/16/23  Flexion 50% 50% 50% 50%  Extension 50% 50% 90% 90%  Right lateral flexion 90% 100% 100% 100%  Left lateral flexion 25% 75% 75% 75%  Right rotation 75% 90% 90% 90%  Left rotation 50% 75% 90% 75%   Goal status: Ongoing in flexion only  3.  Decrease worst pain to 6/10 Baseline: 8/10; 03/18/23 5/10 Goal status: Met  4.  Decrease tenderness in R UT, SCM, infraspinatus and scalene groups to minimal Baseline: moderate; 04/16/23 continued tightness in R SCM Goal  status: Ongoing   PLAN:  PT FREQUENCY: 1x/week  PT DURATION: 4 weeks  PLANNED INTERVENTIONS: 97164- PT Re-evaluation, 97110-Therapeutic exercises, 97530- Therapeutic activity, 97112- Neuromuscular re-education, 97535- Self Care, [id number]- Manual therapy, 97116- Gait training, 97014- Electrical stimulation (unattended), Patient/Family education, Balance training, Dry Needling, Joint mobilization, Spinal mobilization, Cryotherapy, and Moist heat  PLAN FOR NEXT SESSION: HEP review and update, manual techniques as appropriate, aerobic tasks, ROM and flexibility activities, strengthening and PREs, TPDN, gait and balance training as needed   For all possible CPT codes, reference the Planned Interventions line above.     Check all conditions that are expected to impact treatment: {Conditions expected to impact treatment:Complications related to surgery   If treatment provided at initial evaluation, no treatment charged due to lack of authorization.       Jacelyn Cuen M Kaiana Marion, PT 05/09/2023, 12:03 PM

## 2023-05-12 ENCOUNTER — Ambulatory Visit: Payer: Medicaid Other

## 2023-05-15 ENCOUNTER — Encounter: Payer: Self-pay | Admitting: Nurse Practitioner

## 2023-05-15 ENCOUNTER — Ambulatory Visit: Payer: Medicaid Other | Admitting: Nurse Practitioner

## 2023-05-15 VITALS — BP 100/80 | HR 69 | Temp 98.5°F | Ht 64.0 in | Wt 179.2 lb

## 2023-05-15 DIAGNOSIS — E782 Mixed hyperlipidemia: Secondary | ICD-10-CM

## 2023-05-15 DIAGNOSIS — Z0000 Encounter for general adult medical examination without abnormal findings: Secondary | ICD-10-CM | POA: Diagnosis not present

## 2023-05-15 DIAGNOSIS — R7309 Other abnormal glucose: Secondary | ICD-10-CM | POA: Diagnosis not present

## 2023-05-15 DIAGNOSIS — R5383 Other fatigue: Secondary | ICD-10-CM | POA: Diagnosis not present

## 2023-05-15 DIAGNOSIS — Z2821 Immunization not carried out because of patient refusal: Secondary | ICD-10-CM

## 2023-05-15 DIAGNOSIS — R051 Acute cough: Secondary | ICD-10-CM | POA: Diagnosis not present

## 2023-05-15 DIAGNOSIS — Z79899 Other long term (current) drug therapy: Secondary | ICD-10-CM | POA: Diagnosis not present

## 2023-05-15 DIAGNOSIS — E559 Vitamin D deficiency, unspecified: Secondary | ICD-10-CM | POA: Diagnosis not present

## 2023-05-15 DIAGNOSIS — D649 Anemia, unspecified: Secondary | ICD-10-CM | POA: Diagnosis not present

## 2023-05-15 MED ORDER — BENZONATATE 100 MG PO CAPS
100.0000 mg | ORAL_CAPSULE | Freq: Four times a day (QID) | ORAL | 1 refills | Status: AC | PRN
Start: 2023-05-15 — End: 2024-05-14

## 2023-05-15 NOTE — Progress Notes (Signed)
 LILLETTE Kristeen JINNY Gladis, CMA,acting as a neurosurgeon for [name], FNP.,have documented all relevant documentation on the behalf of [name], FNP,as directed by  [name], FNP while in the presence of [name], FNP.  Subjective:    Patient ID: [name] , female    DOB: March 08, 1976 , 48 y.o.   MRN: [id number]  Chief Complaint  Patient presents with   Annual Exam    HPI  Patient presents today for HM, Patient reports compliance with medication. Patient denies any chest pain, SOB, or headaches. Patient has no concerns today. She reports started coughing on Monday after being tired. Also had runny nose. Has been having difficulty with sleep. She also has nasal congestion took Afrin without relief.   Continues to be [facility] - no changes since her last visit.   Her starting weight was 250 lbs before her gastric bypass.     Past Medical History:  Diagnosis Date   Allergy    Anemia    Anxiety    Apnea 12/08/2019   Arthritis    Asthma    Back pain    Constipation    Exertional dyspnea 03/14/2020   Fatty liver    GERD (gastroesophageal reflux disease)    H/O: hysterectomy    Hx of endometriosis    Inappropriate sinus tachycardia (HCC) 12/08/2019   Insomnia    Joint pain    Lactose intolerance    Migraine    Morbid obesity (HCC) 12/08/2019   Osteoporosis    Pneumonia    Pre-diabetes    Urticaria    Vitamin D  deficiency      Family History  Problem Relation Age of Onset   Diabetes Mother    Hypertension Mother    Hyperlipidemia Mother    Sleep apnea Mother    Thyroid  disease Sister    Dementia Father    Heart attack Maternal Grandmother    Stroke Maternal Grandfather    Asthma Son    Healthy Son    Asthma Daughter    Healthy Daughter    Colon cancer Neg Hx    Colon polyps Neg Hx    Esophageal cancer Neg Hx    Rectal cancer Neg Hx    Stomach cancer Neg Hx      Current Outpatient Medications:     acetaminophen  (TYLENOL ) 325 MG tablet, Take 2 tablets (650 mg total) by mouth every 6 (six) hours as needed., Disp: 30 tablet, Rfl: 0   albuterol  (VENTOLIN  HFA) 108 (90 Base) MCG/ACT inhaler, Inhale 1-2 puffs into the lungs every 6 (six) hours as needed for wheezing or shortness of breath., Disp: 1 each, Rfl: 0   benzonatate  (TESSALON  PERLES) 100 MG capsule, Take 1 capsule (100 mg total) by mouth every 6 (six) hours as needed., Disp: 30 capsule, Rfl: 1   calcium carbonate (OS-CAL - DOSED IN MG OF ELEMENTAL CALCIUM) 1250 (500 Ca) MG tablet, Take 1 tablet by mouth daily with breakfast., Disp: , Rfl:    clonazePAM  (KLONOPIN ) 1 MG tablet, Take 0.5-1 mg by mouth at bedtime., Disp: , Rfl:    EPINEPHrine  (EPIPEN  2-PAK) 0.3 mg/0.3 mL IJ SOAJ injection, Inject 0.3 mg into the muscle as needed for anaphylaxis., Disp: 2 each, Rfl: 2   ergocalciferol  (VITAMIN D2) 1.25 MG (50000 UT) capsule, Take 50,000 Units by mouth once a week., Disp: , Rfl:    esomeprazole  (NEXIUM ) 40 MG capsule, Take 1 capsule (40 mg total) by mouth daily.,  Disp: 90 capsule, Rfl: 3   estradiol (CLIMARA - DOSED IN MG/24 HR) 0.1 mg/24hr patch, Place 0.1 mg onto the skin once a week., Disp: , Rfl:    Multiple Vitamins-Minerals (BARIATRIC MULTIVITAMINS/IRON  PO), Take 1 capsule by mouth in the morning and at bedtime., Disp: , Rfl:    ondansetron  (ZOFRAN -ODT) 4 MG disintegrating tablet, TAKE 1 TABLET BY MOUTH EVERY 8 HOURS AS NEEDED FOR NAUSEA FOR UP TO 7 DAYS, Disp: , Rfl:    RESTASIS 0.05 % ophthalmic emulsion, Place 1 drop into both eyes 2 (two) times daily as needed (dryness)., Disp: , Rfl:    diclofenac  (VOLTAREN ) 75 MG EC tablet, Take 75 mg by mouth 2 (two) times daily. (Patient not taking: Reported on 05/15/2023), Disp: , Rfl:    HYDROcodone -acetaminophen  (NORCO) 7.5-325 MG tablet, Take 1 tablet by mouth every 8 (eight) hours as needed for moderate pain. (Patient not taking: Reported on 01/21/2023), Disp: , Rfl:    Allergies  Allergen  Reactions   Aspirin  Other (See Comments)    MD advised pt to not take med   Bactrim  [Sulfamethoxazole -Trimethoprim ]    Doxycycline     Ibuprofen  Other (See Comments)    MD advised pt to not take med    Garnell Gums Allergy] Swelling   Zoledronic Acid Other (See Comments)    Muscle aches      The patient states she is status post hysterectomy.   Patient's last menstrual period was 12/27/2003.  Negative for: breast discharge, breast lump(s), breast pain and breast self exam. Associated symptoms include abnormal vaginal bleeding. Pertinent negatives include abnormal bleeding (hematology), anxiety, decreased libido, depression, difficulty falling sleep, dyspareunia, history of infertility, nocturia, sexual dysfunction, sleep disturbances, urinary incontinence, urinary urgency, vaginal discharge and vaginal itching. Diet regular. The patient states her exercise level is minimal with walking. She is planning to start an exercise program.   The patient's tobacco use is:  Social History   Tobacco Use  Smoking Status Never  Smokeless Tobacco Never  She has been exposed to passive smoke. The patient's alcohol use is:  Social History   Substance and Sexual Activity  Alcohol Use Not Currently   Comment: occasional     Review of Systems  Constitutional: Negative.   HENT: Negative.    Eyes: Negative.   Respiratory:  Positive for cough.   Cardiovascular: Negative.   Gastrointestinal: Negative.   Endocrine: Negative.   Genitourinary: Negative.   Musculoskeletal: Negative.   Skin: Negative.   Allergic/Immunologic: Negative.   Neurological: Negative.   Hematological: Negative.   Psychiatric/Behavioral: Negative.       Today's Vitals   05/15/23 1415  BP: 100/80  Pulse: 69  Temp: 98.5 F (36.9 C)  TempSrc: Oral  Weight: 179 lb 3.2 oz (81.3 kg)  Height: 5' 4 (1.626 m)  PainSc: 0-No pain   Body mass index is 30.76 kg/m.  Wt Readings from Last 3 Encounters:  05/15/23 179 lb 3.2  oz (81.3 kg)  04/01/23 185 lb 1.6 oz (84 kg)  03/14/23 192 lb (87.1 kg)     Objective:  Physical Exam Vitals reviewed.  Constitutional:      General: She is not in acute distress.    Appearance: Normal appearance. She is well-developed. She is obese.  HENT:     Head: Normocephalic and atraumatic.     Right Ear: Hearing, tympanic membrane, ear canal and external ear normal. There is no impacted cerumen.     Left Ear: Hearing, tympanic membrane, ear canal  and external ear normal. There is no impacted cerumen.     Nose:     Comments: Deferred - masked    Mouth/Throat:     Comments: Deferred - masked Eyes:     General: Lids are normal.     Extraocular Movements: Extraocular movements intact.     Conjunctiva/sclera: Conjunctivae normal.     Pupils: Pupils are equal, round, and reactive to light.     Funduscopic exam:    Right eye: No papilledema.        Left eye: No papilledema.  Neck:     Thyroid : No thyroid  mass.     Vascular: No carotid bruit.  Cardiovascular:     Rate and Rhythm: Normal rate and regular rhythm.     Pulses: Normal pulses.     Heart sounds: Normal heart sounds. No murmur heard. Pulmonary:     Effort: Pulmonary effort is normal. No respiratory distress.     Breath sounds: Normal breath sounds. No wheezing.  Chest:     Chest wall: No mass.  Breasts:    Tanner Score is 5.     Right: Normal. No mass or tenderness.     Left: Normal. No mass or tenderness.  Abdominal:     General: Abdomen is flat. Bowel sounds are normal. There is no distension.     Palpations: Abdomen is soft.     Tenderness: There is no abdominal tenderness.  Genitourinary:    Comments: Deferred Musculoskeletal:        General: No swelling or tenderness. Normal range of motion.     Cervical back: Full passive range of motion without pain, normal range of motion and neck supple.     Right lower leg: No edema.     Left lower leg: No edema.  Lymphadenopathy:     Upper Body:     Right  upper body: No supraclavicular, axillary or pectoral adenopathy.     Left upper body: No supraclavicular, axillary or pectoral adenopathy.  Skin:    General: Skin is warm and dry.     Capillary Refill: Capillary refill takes less than 2 seconds.  Neurological:     General: No focal deficit present.     Mental Status: She is alert and oriented to person, place, and time.     Cranial Nerves: No cranial nerve deficit.     Sensory: No sensory deficit.     Motor: No weakness.  Psychiatric:        Mood and Affect: Mood normal.        Behavior: Behavior normal.        Thought Content: Thought content normal.        Judgment: Judgment normal.         Assessment And Plan:     Encounter for annual health examination Assessment & Plan: Behavior modifications discussed and diet history reviewed.   Pt will continue to exercise regularly and modify diet with low GI, plant based foods and decrease intake of processed foods.  Recommend intake of daily multivitamin, Vitamin D , and calcium.  Recommend mammogram and colonoscopy for preventive screenings, as well as recommend immunizations that include influenza, TDAP    Abnormal glucose Assessment & Plan: HgbA1c is slightly elevated, continue focusing on healthy diet low in sugar and starches.   Orders: -     Hemoglobin A1c  Mixed hyperlipidemia Assessment & Plan: Cholesterol levels are stable. A low fat, low cholesterol is discussed with the patient   Orders: -  Lipid panel  Vitamin D  deficiency Assessment & Plan: Will check vitamin D  level and supplement as needed.    Also encouraged to spend 15 minutes in the sun daily.    Orders: -     VITAMIN D  25 Hydroxy (Vit-D Deficiency, Fractures)  COVID-19 vaccination declined Assessment & Plan: Declines covid 19 vaccine. Discussed risk of covid 69 and if she changes her mind about the vaccine to call the office. Education has been provided regarding the importance of this vaccine  but patient still declined. Advised may receive this vaccine at local pharmacy or Health Dept.or vaccine clinic. Aware to provide a copy of the vaccination record if obtained from local pharmacy or Health Dept.  Encouraged to take multivitamin, vitamin d , vitamin c and zinc  to increase immune system. Aware can call office if would like to have vaccine here at office. Verbalized acceptance and understanding.    Influenza vaccination declined Assessment & Plan: Patient declined influenza vaccination at this time. Patient is aware that influenza vaccine prevents illness in 70% of healthy people, and reduces hospitalizations to 30-70% in elderly. This vaccine is recommended annually. Education has been provided regarding the importance of this vaccine but patient still declined. Advised may receive this vaccine at local pharmacy or Health Dept.or vaccine clinic. Aware to provide a copy of the vaccination record if obtained from local pharmacy or Health Dept.  Pt is willing to accept risk associated with refusing vaccination.    Other long term (current) drug therapy -     CBC with Differential/Platelet -     CMP14+EGFR  Other fatigue Assessment & Plan: Will check for metabolic causes and since she had gastric bypass she has a high risk for low iron .   Orders: -     Iron , TIBC and Ferritin Panel -     Vitamin B12 -     TSH  Acute cough Assessment & Plan: No abnormal findings on physical exam. Will treat with tessalon  perles.   Orders: -     Benzonatate ; Take 1 capsule (100 mg total) by mouth every 6 (six) hours as needed.  Dispense: 30 capsule; Refill: 1     Return for 1 year physical, 6 month chol check. Patient was given opportunity to ask questions. Patient verbalized understanding of the plan and was able to repeat key elements of the plan. All questions were answered to their satisfaction.   [name], FNP  I, [name], FNP, have reviewed all documentation for this visit.  The documentation on 05/15/23 for the exam, diagnosis, procedures, and orders are all accurate and complete.

## 2023-05-16 LAB — CBC WITH DIFFERENTIAL/PLATELET
Basophils Absolute: 0 10*3/uL (ref 0.0–0.2)
Basos: 1 %
EOS (ABSOLUTE): 0 10*3/uL (ref 0.0–0.4)
Eos: 0 %
Hematocrit: 36.9 % (ref 34.0–46.6)
Hemoglobin: 11.8 g/dL (ref 11.1–15.9)
Immature Grans (Abs): 0 10*3/uL (ref 0.0–0.1)
Immature Granulocytes: 0 %
Lymphocytes Absolute: 1 10*3/uL (ref 0.7–3.1)
Lymphs: 25 %
MCH: 25.4 pg — ABNORMAL LOW (ref 26.6–33.0)
MCHC: 32 g/dL (ref 31.5–35.7)
MCV: 80 fL (ref 79–97)
Monocytes Absolute: 0.5 10*3/uL (ref 0.1–0.9)
Monocytes: 13 %
Neutrophils Absolute: 2.6 10*3/uL (ref 1.4–7.0)
Neutrophils: 61 %
Platelets: 230 10*3/uL (ref 150–450)
RBC: 4.64 x10E6/uL (ref 3.77–5.28)
RDW: 12 % (ref 11.7–15.4)
WBC: 4.1 10*3/uL (ref 3.4–10.8)

## 2023-05-16 LAB — VITAMIN B12: Vitamin B-12: >2000 pg/mL — ABNORMAL HIGH (ref 232–1245)

## 2023-05-16 LAB — HEMOGLOBIN A1C
Est. average glucose Bld gHb Est-mCnc: 114 mg/dL
Hgb A1c MFr Bld: 5.6 % (ref 4.8–5.6)

## 2023-05-16 LAB — CMP14+EGFR
ALT: 29 IU/L (ref 0–32)
AST: 26 IU/L (ref 0–40)
Albumin: 4.5 g/dL (ref 3.9–4.9)
Alkaline Phosphatase: 68 IU/L (ref 44–121)
BUN/Creatinine Ratio: 15 (ref 9–23)
BUN: 9 mg/dL (ref 6–24)
Bilirubin Total: <0.2 mg/dL (ref 0.0–1.2)
CO2: 25 mmol/L (ref 20–29)
Calcium: 9.2 mg/dL (ref 8.7–10.2)
Chloride: 102 mmol/L (ref 96–106)
Creatinine, Ser: 0.61 mg/dL (ref 0.57–1.00)
Globulin, Total: 2.5 g/dL (ref 1.5–4.5)
Glucose: 82 mg/dL (ref 70–99)
Potassium: 4.2 mmol/L (ref 3.5–5.2)
Sodium: 141 mmol/L (ref 134–144)
Total Protein: 7 g/dL (ref 6.0–8.5)
eGFR: 111 mL/min/1.73 (ref >59)

## 2023-05-16 LAB — IRON,TIBC AND FERRITIN PANEL
Ferritin: 250 ng/mL — ABNORMAL HIGH (ref 15–150)
Iron Saturation: 6 % — CL (ref 15–55)
Iron: 16 ug/dL — ABNORMAL LOW (ref 27–159)
Total Iron Binding Capacity: 284 ug/dL (ref 250–450)
UIBC: 268 ug/dL (ref 131–425)

## 2023-05-16 LAB — LIPID PANEL
Chol/HDL Ratio: 2.1 {ratio} (ref 0.0–4.4)
Cholesterol, Total: 146 mg/dL (ref 100–199)
HDL: 68 mg/dL (ref >39)
LDL Chol Calc (NIH): 66 mg/dL (ref 0–99)
Triglycerides: 58 mg/dL (ref 0–149)
VLDL Cholesterol Cal: 12 mg/dL (ref 5–40)

## 2023-05-16 LAB — VITAMIN D 25 HYDROXY (VIT D DEFICIENCY, FRACTURES): Vit D, 25-Hydroxy: 119 ng/mL — ABNORMAL HIGH (ref 30.0–100.0)

## 2023-05-16 LAB — TSH: TSH: 1.47 u[IU]/mL (ref 0.450–4.500)

## 2023-05-21 ENCOUNTER — Other Ambulatory Visit: Payer: Self-pay

## 2023-05-21 ENCOUNTER — Emergency Department (HOSPITAL_COMMUNITY)
Admission: EM | Admit: 2023-05-21 | Discharge: 2023-05-21 | Discharge status: Against Medical Advice | Payer: Medicaid Other | Attending: Emergency Medicine | Admitting: Emergency Medicine

## 2023-05-21 ENCOUNTER — Encounter (HOSPITAL_COMMUNITY): Payer: Self-pay

## 2023-05-21 DIAGNOSIS — Z5321 Procedure and treatment not carried out due to patient leaving prior to being seen by health care provider: Secondary | ICD-10-CM | POA: Diagnosis not present

## 2023-05-21 DIAGNOSIS — R1010 Upper abdominal pain, unspecified: Secondary | ICD-10-CM | POA: Diagnosis not present

## 2023-05-21 DIAGNOSIS — R197 Diarrhea, unspecified: Secondary | ICD-10-CM | POA: Diagnosis not present

## 2023-05-21 DIAGNOSIS — R110 Nausea: Secondary | ICD-10-CM | POA: Insufficient documentation

## 2023-05-21 DIAGNOSIS — F330 Major depressive disorder, recurrent, mild: Secondary | ICD-10-CM | POA: Diagnosis not present

## 2023-05-21 DIAGNOSIS — F411 Generalized anxiety disorder: Secondary | ICD-10-CM | POA: Diagnosis not present

## 2023-05-21 LAB — COMPREHENSIVE METABOLIC PANEL
ALT: 27 U/L (ref 0–44)
AST: 25 U/L (ref 15–41)
Albumin: 3.9 g/dL (ref 3.5–5.0)
Alkaline Phosphatase: 45 U/L (ref 38–126)
Anion gap: 9 (ref 5–15)
BUN: 9 mg/dL (ref 6–20)
CO2: 25 mmol/L (ref 22–32)
Calcium: 9 mg/dL (ref 8.9–10.3)
Chloride: 104 mmol/L (ref 98–111)
Creatinine, Ser: 0.55 mg/dL (ref 0.44–1.00)
GFR, Estimated: >60 mL/min (ref >60)
Glucose, Bld: 99 mg/dL (ref 70–99)
Potassium: 3.2 mmol/L — ABNORMAL LOW (ref 3.5–5.1)
Sodium: 138 mmol/L (ref 135–145)
Total Bilirubin: 0.4 mg/dL (ref 0.0–1.2)
Total Protein: 7.3 g/dL (ref 6.5–8.1)

## 2023-05-21 LAB — URINALYSIS, ROUTINE W REFLEX MICROSCOPIC
Bilirubin Urine: NEGATIVE
Glucose, UA: NEGATIVE mg/dL
Hgb urine dipstick: NEGATIVE
Ketones, ur: NEGATIVE mg/dL
Nitrite: NEGATIVE
Protein, ur: NEGATIVE mg/dL
Specific Gravity, Urine: 1.013 (ref 1.005–1.030)
WBC, UA: >50 WBC/hpf (ref 0–5)
pH: 5 (ref 5.0–8.0)

## 2023-05-21 LAB — CBC WITH DIFFERENTIAL/PLATELET
Abs Immature Granulocytes: 0 10*3/uL (ref 0.00–0.07)
Basophils Absolute: 0 10*3/uL (ref 0.0–0.1)
Basophils Relative: 1 %
Eosinophils Absolute: 0 10*3/uL (ref 0.0–0.5)
Eosinophils Relative: 1 %
HCT: 33.4 % — ABNORMAL LOW (ref 36.0–46.0)
Hemoglobin: 10.6 g/dL — ABNORMAL LOW (ref 12.0–15.0)
Immature Granulocytes: 0 %
Lymphocytes Relative: 65 %
Lymphs Abs: 2.7 10*3/uL (ref 0.7–4.0)
MCH: 25.8 pg — ABNORMAL LOW (ref 26.0–34.0)
MCHC: 31.7 g/dL (ref 30.0–36.0)
MCV: 81.3 fL (ref 80.0–100.0)
Monocytes Absolute: 0.4 10*3/uL (ref 0.1–1.0)
Monocytes Relative: 10 %
Neutro Abs: 0.9 10*3/uL — ABNORMAL LOW (ref 1.7–7.7)
Neutrophils Relative %: 23 %
Platelets: 259 10*3/uL (ref 150–400)
RBC: 4.11 MIL/uL (ref 3.87–5.11)
RDW: 12.3 % (ref 11.5–15.5)
WBC: 4 10*3/uL (ref 4.0–10.5)
nRBC: 0 % (ref 0.0–0.2)

## 2023-05-21 LAB — LIPASE, BLOOD: Lipase: 26 U/L (ref 11–51)

## 2023-05-21 NOTE — ED Notes (Signed)
 Pt states she wants to leave, declines to stay any longer. Made aware of risks of leaving, states she wants to come back at another time

## 2023-05-21 NOTE — ED Provider Triage Note (Signed)
 Emergency Medicine Provider Triage Evaluation Note  [name] , a 48 y.o. female  was evaluated in triage.  Pt complains of upper abdominal pain for several days with nausea, diarrhea.  Review of Systems  Positive: Abdominal pain, nausea, diarrhea, chills Negative: Dysuria, vomiting  Physical Exam  BP 121/85 (BP Location: Right Arm)   Pulse 70   Temp 98.1 F (36.7 C) (Oral)   Resp 18   LMP 12/27/2003   SpO2 100%  Gen:   Awake, no distress   Resp:  Normal effort  MSK:   Moves extremities without difficulty   Medical Decision Making  Medically screening exam initiated at 2:56 AM.  Appropriate orders placed.  [name] was informed that the remainder of the evaluation will be completed by another provider, this initial triage assessment does not replace that evaluation, and the importance of remaining in the ED until their evaluation is complete.     Kelsey Patricia, MD 05/21/23 [phone]

## 2023-05-21 NOTE — ED Triage Notes (Signed)
 Says she has been having upper periumbilical,  LUQ/RUQ abdominal pain for "a few days" and has not been able to sleep for 2 nights.   Nausea, chills, and hot periods. Denies vomiting

## 2023-05-23 ENCOUNTER — Other Ambulatory Visit (HOSPITAL_COMMUNITY): Payer: Self-pay | Admitting: General Surgery

## 2023-05-23 DIAGNOSIS — R1011 Right upper quadrant pain: Secondary | ICD-10-CM

## 2023-05-23 DIAGNOSIS — Z9884 Bariatric surgery status: Secondary | ICD-10-CM

## 2023-05-25 DIAGNOSIS — R051 Acute cough: Secondary | ICD-10-CM | POA: Insufficient documentation

## 2023-05-25 DIAGNOSIS — Z0000 Encounter for general adult medical examination without abnormal findings: Secondary | ICD-10-CM | POA: Insufficient documentation

## 2023-05-25 DIAGNOSIS — Z2821 Immunization not carried out because of patient refusal: Secondary | ICD-10-CM | POA: Insufficient documentation

## 2023-05-25 NOTE — Assessment & Plan Note (Signed)

## 2023-05-25 NOTE — Assessment & Plan Note (Signed)
Cholesterol levels are stable. A low fat, low cholesterol is discussed with the patient

## 2023-05-25 NOTE — Assessment & Plan Note (Signed)
Will check vitamin D level and supplement as needed.    Also encouraged to spend 15 minutes in the sun daily.   

## 2023-05-25 NOTE — Assessment & Plan Note (Signed)

## 2023-05-25 NOTE — Assessment & Plan Note (Signed)
HgbA1c is slightly elevated, continue focusing on healthy diet low in sugar and starches.

## 2023-05-25 NOTE — Assessment & Plan Note (Signed)
Will check for metabolic causes and since she had gastric bypass she has a high risk for low iron.

## 2023-05-25 NOTE — Assessment & Plan Note (Signed)
No abnormal findings on physical exam. Will treat with tessalon perles.

## 2023-05-25 NOTE — Assessment & Plan Note (Signed)

## 2023-05-29 ENCOUNTER — Encounter: Payer: Self-pay | Admitting: Hematology

## 2023-05-29 ENCOUNTER — Encounter: Payer: Self-pay | Admitting: Nurse Practitioner

## 2023-05-29 DIAGNOSIS — M545 Low back pain, unspecified: Secondary | ICD-10-CM | POA: Diagnosis not present

## 2023-05-29 DIAGNOSIS — M5412 Radiculopathy, cervical region: Secondary | ICD-10-CM | POA: Diagnosis not present

## 2023-05-29 DIAGNOSIS — G894 Chronic pain syndrome: Secondary | ICD-10-CM | POA: Diagnosis not present

## 2023-05-29 DIAGNOSIS — R202 Paresthesia of skin: Secondary | ICD-10-CM | POA: Diagnosis not present

## 2023-05-29 DIAGNOSIS — Z79891 Long term (current) use of opiate analgesic: Secondary | ICD-10-CM | POA: Diagnosis not present

## 2023-06-04 ENCOUNTER — Ambulatory Visit (HOSPITAL_COMMUNITY)
Admission: RE | Admit: 2023-06-04 | Discharge: 2023-06-04 | Disposition: A | Discharge status: Home / Self Care | Payer: Medicaid Other | Source: Ambulatory Visit | Attending: General Surgery | Admitting: General Surgery

## 2023-06-04 DIAGNOSIS — N3289 Other specified disorders of bladder: Secondary | ICD-10-CM | POA: Diagnosis not present

## 2023-06-04 DIAGNOSIS — Z9049 Acquired absence of other specified parts of digestive tract: Secondary | ICD-10-CM | POA: Diagnosis not present

## 2023-06-04 DIAGNOSIS — Z9884 Bariatric surgery status: Secondary | ICD-10-CM | POA: Insufficient documentation

## 2023-06-04 DIAGNOSIS — K439 Ventral hernia without obstruction or gangrene: Secondary | ICD-10-CM | POA: Diagnosis not present

## 2023-06-04 DIAGNOSIS — R1011 Right upper quadrant pain: Secondary | ICD-10-CM | POA: Diagnosis not present

## 2023-06-04 MED ORDER — IOHEXOL 300 MG/ML  SOLN
100.0000 mL | Freq: Once | INTRAMUSCULAR | Status: AC | PRN
  Administered 2023-06-04: 100 mL via INTRAVENOUS

## 2023-06-04 MED ORDER — IOHEXOL 300 MG/ML  SOLN
30.0000 mL | Freq: Once | INTRAMUSCULAR | Status: AC | PRN
  Administered 2023-06-04: 30 mL via ORAL

## 2023-06-05 DIAGNOSIS — F330 Major depressive disorder, recurrent, mild: Secondary | ICD-10-CM | POA: Diagnosis not present

## 2023-06-05 DIAGNOSIS — F411 Generalized anxiety disorder: Secondary | ICD-10-CM | POA: Diagnosis not present

## 2023-06-19 DIAGNOSIS — F411 Generalized anxiety disorder: Secondary | ICD-10-CM | POA: Diagnosis not present

## 2023-06-19 DIAGNOSIS — F330 Major depressive disorder, recurrent, mild: Secondary | ICD-10-CM | POA: Diagnosis not present

## 2023-06-27 DIAGNOSIS — M542 Cervicalgia: Secondary | ICD-10-CM | POA: Diagnosis not present

## 2023-06-27 DIAGNOSIS — G894 Chronic pain syndrome: Secondary | ICD-10-CM | POA: Diagnosis not present

## 2023-06-27 DIAGNOSIS — R202 Paresthesia of skin: Secondary | ICD-10-CM | POA: Diagnosis not present

## 2023-06-27 DIAGNOSIS — M545 Low back pain, unspecified: Secondary | ICD-10-CM | POA: Diagnosis not present

## 2023-06-27 DIAGNOSIS — Z79891 Long term (current) use of opiate analgesic: Secondary | ICD-10-CM | POA: Diagnosis not present

## 2023-07-01 ENCOUNTER — Other Ambulatory Visit: Payer: Self-pay

## 2023-07-01 DIAGNOSIS — F411 Generalized anxiety disorder: Secondary | ICD-10-CM | POA: Diagnosis not present

## 2023-07-01 DIAGNOSIS — D500 Iron deficiency anemia secondary to blood loss (chronic): Secondary | ICD-10-CM

## 2023-07-01 DIAGNOSIS — F330 Major depressive disorder, recurrent, mild: Secondary | ICD-10-CM | POA: Diagnosis not present

## 2023-07-02 ENCOUNTER — Inpatient Hospital Stay: Payer: Medicaid Other | Attending: Hematology

## 2023-07-02 ENCOUNTER — Inpatient Hospital Stay: Payer: Medicaid Other | Admitting: Hematology

## 2023-07-02 VITALS — BP 108/81 | HR 72 | Temp 97.9°F | Resp 16 | Wt 181.1 lb

## 2023-07-02 DIAGNOSIS — Z9884 Bariatric surgery status: Secondary | ICD-10-CM | POA: Insufficient documentation

## 2023-07-02 DIAGNOSIS — D509 Iron deficiency anemia, unspecified: Secondary | ICD-10-CM | POA: Insufficient documentation

## 2023-07-02 DIAGNOSIS — D563 Thalassemia minor: Secondary | ICD-10-CM | POA: Insufficient documentation

## 2023-07-02 DIAGNOSIS — D500 Iron deficiency anemia secondary to blood loss (chronic): Secondary | ICD-10-CM | POA: Diagnosis not present

## 2023-07-02 LAB — CMP (CANCER CENTER ONLY)
ALT: 33 U/L (ref 0–44)
AST: 29 U/L (ref 15–41)
Albumin: 4.3 g/dL (ref 3.5–5.0)
Alkaline Phosphatase: 52 U/L (ref 38–126)
Anion gap: 5 (ref 5–15)
BUN: 15 mg/dL (ref 6–20)
CO2: 29 mmol/L (ref 22–32)
Calcium: 9.4 mg/dL (ref 8.9–10.3)
Chloride: 102 mmol/L (ref 98–111)
Creatinine: 0.63 mg/dL (ref 0.44–1.00)
GFR, Estimated: >60 mL/min (ref >60)
Glucose, Bld: 78 mg/dL (ref 70–99)
Potassium: 4 mmol/L (ref 3.5–5.1)
Sodium: 136 mmol/L (ref 135–145)
Total Bilirubin: 0.4 mg/dL (ref 0.0–1.2)
Total Protein: 7.4 g/dL (ref 6.5–8.1)

## 2023-07-02 LAB — CBC WITH DIFFERENTIAL (CANCER CENTER ONLY)
Abs Immature Granulocytes: 0.01 10*3/uL (ref 0.00–0.07)
Basophils Absolute: 0 10*3/uL (ref 0.0–0.1)
Basophils Relative: 1 %
Eosinophils Absolute: 0 10*3/uL (ref 0.0–0.5)
Eosinophils Relative: 1 %
HCT: 35.2 % — ABNORMAL LOW (ref 36.0–46.0)
Hemoglobin: 11.3 g/dL — ABNORMAL LOW (ref 12.0–15.0)
Immature Granulocytes: 0 %
Lymphocytes Relative: 36 %
Lymphs Abs: 2.1 10*3/uL (ref 0.7–4.0)
MCH: 26 pg (ref 26.0–34.0)
MCHC: 32.1 g/dL (ref 30.0–36.0)
MCV: 80.9 fL (ref 80.0–100.0)
Monocytes Absolute: 0.4 10*3/uL (ref 0.1–1.0)
Monocytes Relative: 6 %
Neutro Abs: 3.3 10*3/uL (ref 1.7–7.7)
Neutrophils Relative %: 56 %
Platelet Count: 263 10*3/uL (ref 150–400)
RBC: 4.35 MIL/uL (ref 3.87–5.11)
RDW: 13.1 % (ref 11.5–15.5)
WBC Count: 5.8 10*3/uL (ref 4.0–10.5)
nRBC: 0 % (ref 0.0–0.2)

## 2023-07-02 LAB — IRON AND IRON BINDING CAPACITY (CC-WL,HP ONLY)
Iron: 81 ug/dL (ref 28–170)
Saturation Ratios: 24 % (ref 10.4–31.8)
TIBC: 343 ug/dL (ref 250–450)
UIBC: 262 ug/dL (ref 148–442)

## 2023-07-02 LAB — VITAMIN B12: Vitamin B-12: 1721 pg/mL — ABNORMAL HIGH (ref 180–914)

## 2023-07-02 NOTE — Progress Notes (Signed)
 HEMATOLOGY/ONCOLOGY CLINIC NOTE  Date of Service: 07/02/23   Patient Care Team: [name], FNP as PCP - General (General Practice) Chilton Si, MD as PCP - Cardiology (Cardiology) Drema Dallas, DO as Consulting Physician (Neurology)  CHIEF COMPLAINTS/PURPOSE OF CONSULTATION:  Patient has been referred to [place] for continued valuation and management of iron deficiency anemia  HISTORY OF PRESENTING ILLNESS:   [name] is a wonderful 48 y.o. female who has been referred to [place] by Dr. Adela Lank for evaluation and management of iron deficiency anemia. The pt reports that she is doing well overall.   The pt reports that she has been anemic for as long as she can remember. Dr. Adela Lank completed an Upper Endoscopy earlier this year to work up the cause for her anemia. No cause has been found yet. Pt also had a Colonoscopy in 2018 and a Capsule Endoscopy in 2019. She denies significant hemorrhoidal bleeding. Pt stopped having menstrual cycles in 2005 after having a partial hysterectomy for Endometriosis. She does not take much OTC pain medication as it upsets her stomach. Pt is currently taking high-dose Vitamin D twice per week and Calcium daily. She does not have thorough knowledge of her paternal family history, but is not aware of any family history of blood disorders or cancers.   Pt is having pain around her ear, right-sided jaw pain, dry mouth, dry eyes & burning sensation mainly in her right eye. She has tried eye lubricants and steroidal eye drops to no avail. Pt has had these symptoms evaluated by ENT & Optometrist, who found no cause. These symptoms have been intermittent for the last month. Pt also reports recent increase in urinary urgency, SOB, and new heart palpitations. She has had difficulty swallowing since 2018, which has not improved since dilation was performed during the latest Endoscopy. Pt was recently on Reclast but has been off for a few months. Pt has  been using OTC Ferrous Sulfate and has experienced constipation.    Most recent lab results (02/16/2020) of CBC is as follows: all values are WNL except for Hgb at 10.7, HCT at 33.5, MCV at 75.6, RDW at 15.7. 02/16/2020 IBC + Ferritin is as follows: Iron at 58, TIBC at 257.0, Sat Ratios at 16.1, Ferritin at 53.5.  On review of systems, pt reports tingling/numbness in hands/feet, discomfort in mastoid area, jaw pain, dry mouth, dry eyes, ankle pain, urinary urgency, SOB, palpitations, constipation and denies joint swelling, rash, mouth sores, abdominal pain, abnormal/excessive bleeding and any other symptoms.   On PMHx the pt reports Endometriosis, Vitamin D deficiency, Osteoporosis, Inappropriate sinus tachycardia, Back pain, Anemia, Partial hysterectomy.  Interval History  [name] is a 48 y.o. female here for continued evaluation and management of iron deficiency anemia. Patient was last seen by me on 08/28/2022 and reported successful gastric bypass surgery with 30-pound weight loss. She complained of increased fatigue and was otherwise doing well overall with no new medical complaints.  She presented to the ED on 05/21/2023 for upper abdominal pain with nausea and diarrhea.   Today, she reports that she has ben doing fairly well since her last clinical visit. Patient reports worsened fatigue recently. She notes that her energy levels were normal for a while after her bypass surgery, but has recently worsened.   Patient received blood work on 05/21/2023, which showed low iron saturation 6%, high ferritin 250 ng/mL, TSH 1,470 uIU/mL, vitamin B12 >2000 pg/mL, and vitamin D 119 ng/mL.   Patient  denies any recent concerns for infections. She denies any other sign of inflammation such as painful/swollen joints or skin rashes.   She reports that her weight fluctuates around 180lbs. Patient notes that her starting-weight before surgery was 250 pounds about two years ago. She continues to  follow with her nutritional therapist and surgery team.   Patient complains of generalized fatigue not specific to any local area. She reports that her fatigue is intermittent. Her fatigue and energy level has worsened recently.   Patient does not have any menstrual cycles or other reason for blood loss, such as blood in the stools, black stools, gum bleeds, nose bleeds, or blood in urine or new abdominal pain.   She continues to be on estrogen patch.   Patient denies any recent viral infections or new emotional concerns causing depressive-like symptoms.   She takes ferrous sulphate once a day regularly and denies any toxicity issues such as constipation. Patient regularly takes vitamin B complex and vitamin D. Patient reports that she is not on Nexium.   She continues to work with [employer].   MEDICAL HISTORY: Past Medical History:  Diagnosis Date   Allergy    Anemia    Anxiety    Apnea 12/08/2019   Arthritis    Asthma    Back pain    Constipation    Exertional dyspnea 03/14/2020   Fatty liver    GERD (gastroesophageal reflux disease)    H/O: hysterectomy    Hx of endometriosis    Inappropriate sinus tachycardia (HCC) 12/08/2019   Insomnia    Joint pain    Lactose intolerance    Migraine    Morbid obesity (HCC) 12/08/2019   Osteoporosis    Pneumonia    Pre-diabetes    Urticaria    Vitamin D deficiency     SURGICAL HISTORY: Past Surgical History:  Procedure Laterality Date   39 HOUR PH STUDY N/A 01/12/2018   Procedure: 24 HOUR PH STUDY;  Surgeon: Napoleon Form, MD;  Location: [facility];  Service: Endoscopy;  Laterality: N/A;   ABDOMINAL HYSTERECTOMY  12/12/2003   ADENOIDECTOMY  05/2011   ANKLE ARTHROSCOPY  12/29/2007   right; with extensive debridement   BLADDER NECK RECONSTRUCTION  01/14/2011   Procedure: BLADDER NECK REPAIR;  Surgeon: Reva Bores, MD;  Location: [facility];  Service: Gynecology;  Laterality: N/A;  Laparoscopic Repair of  Incidental Cystotomy   BREAST REDUCTION SURGERY  08/05/2011   Procedure: MAMMARY REDUCTION  (BREAST);  Surgeon: Louisa Second, MD;  Location: [facility];  Service: Plastics;  Laterality: Bilateral;  bilateral   CESAREAN SECTION  12/29/2000; 1994   CHOLECYSTECTOMY     age 74   DILATION AND CURETTAGE OF UTERUS  07/08/2003   open laparoscopy   ESOPHAGEAL MANOMETRY N/A 01/12/2018   Procedure: ESOPHAGEAL MANOMETRY (EM);  Surgeon: Napoleon Form, MD;  Location: [facility];  Service: Endoscopy;  Laterality: N/A;   GASTRIC ROUX-EN-Y N/A 03/19/2022   Procedure: LAPAROSCOPIC ROUX-EN-Y GASTRIC BYPASS WITH UPPER ENDOSCOPY;  Surgeon: Gaynelle Adu, MD;  Location: [facility];  Service: General;  Laterality: N/A;   GIVENS CAPSULE STUDY N/A 06/23/2017   Procedure: GIVENS CAPSULE STUDY;  Surgeon: Benancio Deeds, MD;  Location: [facility];  Service: Gastroenterology;  Laterality: N/A;   HIATAL HERNIA REPAIR N/A 03/19/2022   Procedure: HERNIA REPAIR HIATAL;  Surgeon: Gaynelle Adu, MD;  Location: [facility];  Service: General;  Laterality: N/A;   LAPAROSCOPIC SALPINGOOPHERECTOMY  01/14/2011   left  LAPAROSCOPY N/A 11/01/2022   Procedure: LAPAROSCOPY DIAGNOSTIC; LAPARSCOPIC CLOSURE OF PETERSEN SPACE;  Surgeon: Gaynelle Adu, MD;  Location: [facility];  Service: General;  Laterality: N/A;  DR Gerrit Friends TO ASSIST   PH IMPEDANCE STUDY N/A 01/12/2018   Procedure: PH IMPEDANCE STUDY;  Surgeon: Napoleon Form, MD;  Location: [facility];  Service: Endoscopy;  Laterality: N/A;   RESECTION DISTAL CLAVICAL Right 09/25/2017   Procedure: RESECTION DISTAL CLAVICAL;  Surgeon: Bjorn Pippin, MD;  Location: [facility];  Service: Orthopedics;  Laterality: Right;   RIGHT OOPHORECTOMY  2011   with lysis of adhesions   SHOULDER ACROMIOPLASTY Right 09/25/2017   Procedure: SHOULDER ACROMIOPLASTY;  Surgeon: Bjorn Pippin, MD;  Location: [facility];  Service: Orthopedics;   Laterality: Right;   SHOULDER ARTHROSCOPY WITH DEBRIDEMENT AND BICEP TENDON REPAIR Right 09/25/2017   Procedure: SHOULDER ARTHROSCOPY WITH DEBRIDEMENT AND BICEP TENDON REPAIR;  Surgeon: Bjorn Pippin, MD;  Location: [facility];  Service: Orthopedics;  Laterality: Right;   SHOULDER ARTHROSCOPY WITH ROTATOR CUFF REPAIR Right 09/25/2017   Procedure: SHOULDER ARTHROSCOPY WITH ROTATOR CUFF REPAIR;  Surgeon: Bjorn Pippin, MD;  Location: [facility];  Service: Orthopedics;  Laterality: Right;   TONSILLECTOMY  as a child   TUBAL LIGATION  12/29/2000   UPPER GASTROINTESTINAL ENDOSCOPY     UPPER GI ENDOSCOPY N/A 11/01/2022   Procedure: UPPER GI ENDOSCOPY;  Surgeon: Gaynelle Adu, MD;  Location: [facility];  Service: General;  Laterality: N/A;    SOCIAL HISTORY: Social History   Socioeconomic History   Marital status: Divorced    Spouse name: Not on file   Number of children: 2   Years of education: 12th   Highest education level: Not on file  Occupational History   Occupation: Facilities manager: OTHER    Comment: [employer]  Tobacco Use   Smoking status: Never   Smokeless tobacco: Never  Vaping Use   Vaping status: Never Used  Substance and Sexual Activity   Alcohol use: Not Currently    Comment: occasional    Drug use: No   Sexual activity: Not Currently    Birth control/protection: Surgical  Other Topics Concern   Not on file  Social History Narrative   Pt lives in 2 story town home with her son   Has 2 children   High school Paramedic for [employer]   Right handed   Social Drivers of Health   Financial Resource Strain: Not on file  Food Insecurity: No Food Insecurity (03/19/2022)   Hunger Vital Sign    Worried About Running Out of Food in the Last Year: Never true    Ran Out of Food in the Last Year: Never true  Transportation Needs: No Transportation Needs (03/19/2022)   PRAPARE - Administrator, Civil Service  (Medical): No    Lack of Transportation (Non-Medical): No  Physical Activity: Not on file  Stress: Not on file  Social Connections: Unknown (09/03/2021)   Received from [facility]   Social Network    Social Network: Not on file  Intimate Partner Violence: Not At Risk (03/19/2022)   Humiliation, Afraid, Rape, and Kick questionnaire    Fear of Current or Ex-Partner: No    Emotionally Abused: No    Physically Abused: No    Sexually Abused: No    FAMILY HISTORY: Family History  Problem Relation Age of  Onset   Diabetes Mother    Hypertension Mother    Hyperlipidemia Mother    Sleep apnea Mother    Thyroid disease Sister    Dementia Father    Heart attack Maternal Grandmother    Stroke Maternal Grandfather    Asthma Son    Healthy Son    Asthma Daughter    Healthy Daughter    Colon cancer Neg Hx    Colon polyps Neg Hx    Esophageal cancer Neg Hx    Rectal cancer Neg Hx    Stomach cancer Neg Hx     ALLERGIES:  is allergic to aspirin, bactrim [sulfamethoxazole-trimethoprim], doxycycline, ibuprofen, salmon [fish allergy], and zoledronic acid.  MEDICATIONS:  Current Outpatient Medications  Medication Sig Dispense Refill   acetaminophen (TYLENOL) 325 MG tablet Take 2 tablets (650 mg total) by mouth every 6 (six) hours as needed. 30 tablet 0   albuterol (VENTOLIN HFA) 108 (90 Base) MCG/ACT inhaler Inhale 1-2 puffs into the lungs every 6 (six) hours as needed for wheezing or shortness of breath. 1 each 0   benzonatate (TESSALON PERLES) 100 MG capsule Take 1 capsule (100 mg total) by mouth every 6 (six) hours as needed. 30 capsule 1   calcium carbonate (OS-CAL - DOSED IN MG OF ELEMENTAL CALCIUM) 1250 (500 Ca) MG tablet Take 1 tablet by mouth daily with breakfast.     clonazePAM (KLONOPIN) 1 MG tablet Take 0.5-1 mg by mouth at bedtime.     diclofenac (VOLTAREN) 75 MG EC tablet Take 75 mg by mouth 2 (two) times daily. (Patient not taking: Reported on 05/15/2023)      EPINEPHrine (EPIPEN 2-PAK) 0.3 mg/0.3 mL IJ SOAJ injection Inject 0.3 mg into the muscle as needed for anaphylaxis. 2 each 2   ergocalciferol (VITAMIN D2) 1.25 MG (50000 UT) capsule Take 50,000 Units by mouth once a week.     esomeprazole (NEXIUM) 40 MG capsule Take 1 capsule (40 mg total) by mouth daily. 90 capsule 3   estradiol (CLIMARA - DOSED IN MG/24 HR) 0.1 mg/24hr patch Place 0.1 mg onto the skin once a week.     HYDROcodone-acetaminophen (NORCO) 7.5-325 MG tablet Take 1 tablet by mouth every 8 (eight) hours as needed for moderate pain. (Patient not taking: Reported on 01/21/2023)     Multiple Vitamins-Minerals (BARIATRIC MULTIVITAMINS/IRON PO) Take 1 capsule by mouth in the morning and at bedtime.     ondansetron (ZOFRAN-ODT) 4 MG disintegrating tablet TAKE 1 TABLET BY MOUTH EVERY 8 HOURS AS NEEDED FOR NAUSEA FOR UP TO 7 DAYS     RESTASIS 0.05 % ophthalmic emulsion Place 1 drop into both eyes 2 (two) times daily as needed (dryness).     No current facility-administered medications for this visit.    REVIEW OF SYSTEMS:    10 Point review of Systems was done is negative except as noted above.   PHYSICAL EXAMINATION:   .BP 108/81 (BP Location: Left Arm, Patient Position: Sitting)   Pulse 72   Temp 97.9 F (36.6 C) (Temporal)   Resp 16   Wt 181 lb 1.6 oz (82.1 kg)   LMP 12/27/2003   SpO2 100%   BMI 31.09 kg/m  GENERAL:alert, in no acute distress and comfortable SKIN: no acute rashes, no significant lesions EYES: conjunctiva are pink and non-injected, sclera anicteric OROPHARYNX: MMM, no exudates, no oropharyngeal erythema or ulceration NECK: supple, no JVD LYMPH:  no palpable lymphadenopathy in the cervical, axillary or inguinal regions LUNGS: clear to auscultation  b/l with normal respiratory effort HEART: regular rate & rhythm ABDOMEN:  normoactive bowel sounds , non tender, not distended. Extremity: no pedal edema PSYCH: alert & oriented x 3 with fluent speech NEURO: no  focal motor/sensory deficits   LABORATORY DATA:  I have reviewed the data as listed  .    Latest Ref Rng & Units 07/02/2023    2:49 PM 05/21/2023    3:07 AM 05/15/2023    3:10 PM  CBC  WBC 4.0 - 10.5 K/uL 5.8  4.0  4.1   Hemoglobin 12.0 - 15.0 g/dL 16.1  09.6  04.5   Hematocrit 36.0 - 46.0 % 35.2  33.4  36.9   Platelets 150 - 400 K/uL 263  259  230    . CBC    Component Value Date/Time   WBC 5.8 07/02/2023 1449   WBC 4.0 05/21/2023 0307   RBC 4.35 07/02/2023 1449   HGB 11.3 (L) 07/02/2023 1449   HGB 11.8 05/15/2023 1510   HCT 35.2 (L) 07/02/2023 1449   HCT 36.9 05/15/2023 1510   PLT 263 07/02/2023 1449   PLT 230 05/15/2023 1510   MCV 80.9 07/02/2023 1449   MCV 80 05/15/2023 1510   MCH 26.0 07/02/2023 1449   MCHC 32.1 07/02/2023 1449   RDW 13.1 07/02/2023 1449   RDW 12.0 05/15/2023 1510   LYMPHSABS 2.1 07/02/2023 1449   LYMPHSABS 1.0 05/15/2023 1510   MONOABS 0.4 07/02/2023 1449   EOSABS 0.0 07/02/2023 1449   EOSABS 0.0 05/15/2023 1510   BASOSABS 0.0 07/02/2023 1449   BASOSABS 0.0 05/15/2023 1510    .    Latest Ref Rng & Units 05/21/2023    3:07 AM 05/15/2023    3:10 PM 11/04/2022    3:56 PM  CMP  Glucose 70 - 99 mg/dL 99  82  95   BUN 6 - 20 mg/dL 9  9  10    Creatinine 0.44 - 1.00 mg/dL 4.09  8.11  9.14   Sodium 135 - 145 mmol/L 138  141  138   Potassium 3.5 - 5.1 mmol/L 3.2  4.2  4.1   Chloride 98 - 111 mmol/L 104  102  104   CO2 22 - 32 mmol/L 25  25  27    Calcium 8.9 - 10.3 mg/dL 9.0  9.2  9.1   Total Protein 6.5 - 8.1 g/dL 7.3  7.0    Total Bilirubin 0.0 - 1.2 mg/dL 0.4  <7.8    Alkaline Phos 38 - 126 U/L 45  68    AST 15 - 41 U/L 25  26    ALT 0 - 44 U/L 27  29     . Lab Results  Component Value Date   IRON 16 (L) 05/15/2023   TIBC 284 05/15/2023   IRONPCTSAT 6 (LL) 05/15/2023   (Iron and TIBC)  Lab Results  Component Value Date   FERRITIN 250 (H) 05/15/2023   Alpha-Thalassemia GenotypR Order: [id number] Status: Final result   Visible to  patient: Yes (seen)   Next appt: 06/19/2020 at 10:40 AM in Family Medicine Gavin Pound Opalski, DO)   Dx: Microcytic anemia   0 Result Notes  Component 3 mo ago  Alpha-Thalassemia Comment:   Comment: (NOTE)  Test: Alpha-Thalassemia, DNA Analysis  Result:      Alpha-+-thalassemia trait, alpha alpha/alpha-  Mutation(s) identified: alpha3.7  Interpretation:  This result is most consistent with this individual being an  unaffected carrier of alpha-thalassemia with a single gene  deletion,  also called alpha-+-thalassemia trait.       RADIOGRAPHIC STUDIES: I have personally reviewed the radiological images as listed and agreed with the findings in the report. CT ABDOMEN PELVIS W CONTRAST Result Date: 06/08/2023 CLINICAL DATA:  Right upper quadrant abdominal pain, history of gastric bypass. EXAM: CT ABDOMEN AND PELVIS WITH CONTRAST TECHNIQUE: Multidetector CT imaging of the abdomen and pelvis was performed using the standard protocol following bolus administration of intravenous contrast. RADIATION DOSE REDUCTION: This exam was performed according to the departmental dose-optimization program which includes automated exposure control, adjustment of the mA and/or kV according to patient size and/or use of iterative reconstruction technique. CONTRAST:  OMNIPAQUE IOHEXOL 300 MG/ML  SOLN COMPARISON:  CT Oct 04, 2022 FINDINGS: Lower chest: No acute abnormality. Hepatobiliary: No suspicious hepatic lesion. Gallbladder surgically absent. No biliary ductal dilation. Pancreas: No pancreatic ductal dilation or evidence of acute inflammation Spleen: No splenomegaly. Adrenals/Urinary Tract: Bilateral adrenal glands appear normal. No hydronephrosis. Kidneys demonstrate symmetric enhancement. Urinary bladder is distended extending above the sacral promontory. Slight asymmetric wall thickening with adjacent calcification along the anterior left side of the urinary bladder on image 68/2. Stomach/Bowel: Radiopaque  enteric contrast material traverses distal loops of small bowel. Surgical change of prior gastric bypass. No evidence of bowel obstruction. Distended fluid-filled loops of small bowel with gas fluid levels in the proximal colon. Vascular/Lymphatic: Normal caliber abdominal aorta. Smooth IVC contours. Portal, splenic and superior mesenteric veins are patent. No pathologically enlarged abdominal or pelvic lymph nodes. Reproductive: Status post hysterectomy. No adnexal masses. Other: Small ventral hernia contains fat and nonobstructed portion of bowel. Musculoskeletal: No aggressive lytic or blastic lesion of bone. IMPRESSION: 1. Distended fluid-filled loops of small bowel with gas fluid levels in the proximal colon, suggestive of a nonspecific enteritis. 2. Urinary bladder is distended extending above the sacral promontory. Slight asymmetric wall thickening with adjacent calcification along the anterior left side of the urinary bladder, nonspecific, consider correlation with urinalysis and urology consult. 3. Small ventral hernia contains fat and nonobstructed portion of bowel. Electronically Signed   By: Maudry Mayhew M.D.   On: 06/08/2023 10:12    ASSESSMENT & PLAN:   48 yo female with   1) Microcytic Anemia due to iron deficiency 2) Alpha thalassemia trait PLAN:  -Discussed lab results on 07/02/23 in detail with patient. CBC showed WBC of 5.8K, hemoglobin of 11.3, and platelets of 263K. -her hgb is close to her baseline of high 11-12 with alpha thalassemia trait  -her anemia is not significantly limiting at this time -WBC and platelets normal -CMP shows normal findings and does not explain her elevated ferritin -discussed that based on labs from 1 month ago, her ferritin level is unusually high at 250 ng/mL with her iron saturation of only 6% -discussed that in normal circumstances, ferritin goal would be at least 100 along with 20-30% iron saturation -discussed that unusually high ferritin can  be from several factors, including if there is lot of inflammation related to a different process, inflammation in the liver, or RBC hemolysis in her blood sample.  -it is likely that her ferritin level one month ago was not accurate given her unusual iron parameters of low iron saturation and elevated ferritin -other labs from January show B12 >2000 and normal TSH at 1.470 uIU/mL -iron labs from today shows iron saturation 24% Ferritin is 98 B12 - 1721 -06/04/2023 CT abdomen scan showed Distended fluid-filled loops of small bowel with gas fluid levels in the proximal  colon, suggestive of a nonspecific enteritis. Discussed that this could be a GI infection causing abnormal inflammation and elevated ferritin.  -She currently takes ferrous sulphate once daily -discussed option of IV iron to replace her iron if needed -continue vitamin D and vitamin B complex -continue to work with dietitian to optimize nutritional diet  -answered all of patient's questions in detail  FOLLOW-UP: RTC with Dr Candise Che with labs in 4 months  The total time spent in the appointment was 20 minutes* .  All of the patient's questions were answered with apparent satisfaction. The patient knows to call the clinic with any problems, questions or concerns.   Wyvonnia Lora MD MS AAHIVMS [facility] Hematology/Oncology Physician [facility]  .*Total Encounter Time as defined by the Centers for Medicare and Medicaid Services includes, in addition to the face-to-face time of a patient visit (documented in the note above) non-face-to-face time: obtaining and reviewing outside history, ordering and reviewing medications, tests or procedures, care coordination (communications with other health care professionals or caregivers) and documentation in the medical record.    I,Mitra Faeizi,acting as a Neurosurgeon for Wyvonnia Lora, MD.,have documented all relevant documentation on the behalf of Wyvonnia Lora, MD,as directed by  Wyvonnia Lora, MD while in the presence of Wyvonnia Lora, MD.  .I have reviewed the above documentation for accuracy and completeness, and I agree with the above. Johney Maine MD

## 2023-07-03 DIAGNOSIS — Z9884 Bariatric surgery status: Secondary | ICD-10-CM | POA: Diagnosis not present

## 2023-07-03 DIAGNOSIS — E6609 Other obesity due to excess calories: Secondary | ICD-10-CM | POA: Diagnosis not present

## 2023-07-03 DIAGNOSIS — R682 Dry mouth, unspecified: Secondary | ICD-10-CM | POA: Diagnosis not present

## 2023-07-03 DIAGNOSIS — E66811 Obesity, class 1: Secondary | ICD-10-CM | POA: Diagnosis not present

## 2023-07-03 DIAGNOSIS — E7800 Pure hypercholesterolemia, unspecified: Secondary | ICD-10-CM | POA: Diagnosis not present

## 2023-07-03 DIAGNOSIS — R7303 Prediabetes: Secondary | ICD-10-CM | POA: Diagnosis not present

## 2023-07-03 LAB — FERRITIN: Ferritin: 98 ng/mL (ref 11–307)

## 2023-07-04 ENCOUNTER — Ambulatory Visit: Payer: Medicaid Other | Admitting: Hematology

## 2023-07-04 ENCOUNTER — Other Ambulatory Visit: Payer: Medicaid Other

## 2023-07-09 ENCOUNTER — Encounter: Payer: Self-pay | Admitting: Hematology

## 2023-07-09 NOTE — Progress Notes (Incomplete)
 HEMATOLOGY/ONCOLOGY CLINIC NOTE  Date of Service: 07/02/23   Patient Care Team: [name], FNP as PCP - General (General Practice) Chilton Si, MD as PCP - Cardiology (Cardiology) Drema Dallas, DO as Consulting Physician (Neurology)  CHIEF COMPLAINTS/PURPOSE OF CONSULTATION:  Patient has been referred to [place] for continued valuation and management of iron deficiency anemia  HISTORY OF PRESENTING ILLNESS:   [name] is a wonderful 48 y.o. female who has been referred to [place] by Dr. Adela Lank for evaluation and management of iron deficiency anemia. The pt reports that she is doing well overall.   The pt reports that she has been anemic for as long as she can remember. Dr. Adela Lank completed an Upper Endoscopy earlier this year to work up the cause for her anemia. No cause has been found yet. Pt also had a Colonoscopy in 2018 and a Capsule Endoscopy in 2019. She denies significant hemorrhoidal bleeding. Pt stopped having menstrual cycles in 2005 after having a partial hysterectomy for Endometriosis. She does not take much OTC pain medication as it upsets her stomach. Pt is currently taking high-dose Vitamin D twice per week and Calcium daily. She does not have thorough knowledge of her paternal family history, but is not aware of any family history of blood disorders or cancers.   Pt is having pain around her ear, right-sided jaw pain, dry mouth, dry eyes & burning sensation mainly in her right eye. She has tried eye lubricants and steroidal eye drops to no avail. Pt has had these symptoms evaluated by ENT & Optometrist, who found no cause. These symptoms have been intermittent for the last month. Pt also reports recent increase in urinary urgency, SOB, and new heart palpitations. She has had difficulty swallowing since 2018, which has not improved since dilation was performed during the latest Endoscopy. Pt was recently on Reclast but has been off for a few months. Pt has  been using OTC Ferrous Sulfate and has experienced constipation.    Most recent lab results (02/16/2020) of CBC is as follows: all values are WNL except for Hgb at 10.7, HCT at 33.5, MCV at 75.6, RDW at 15.7. 02/16/2020 IBC + Ferritin is as follows: Iron at 58, TIBC at 257.0, Sat Ratios at 16.1, Ferritin at 53.5.  On review of systems, pt reports tingling/numbness in hands/feet, discomfort in mastoid area, jaw pain, dry mouth, dry eyes, ankle pain, urinary urgency, SOB, palpitations, constipation and denies joint swelling, rash, mouth sores, abdominal pain, abnormal/excessive bleeding and any other symptoms.   On PMHx the pt reports Endometriosis, Vitamin D deficiency, Osteoporosis, Inappropriate sinus tachycardia, Back pain, Anemia, Partial hysterectomy.  Interval History  [name] is a 48 y.o. female here for continued evaluation and management of iron deficiency anemia. Patient was last seen by me on 08/28/2022 and reported successful gastric bypass surgery with 30-pound weight loss. She complained of increased fatigue and was otherwise doing well overall with no new medical complaints.  She presented to the ED on 05/21/2023 for upper abdominal pain with nausea and diarrhea.   Today, she reports that she has ben doing fairly well since her last clinical visit. Patient reports worsened fatigue recently. She notes that her energy levels were normal for a while after her bypass surgery, but has recently worsened.   Patient received blood work on 05/21/2023, which showed low iron saturation 6%, high ferritin 250 ng/mL, TSH 1,470 uIU/mL, vitamin B12 >2000 pg/mL, and vitamin D 119 ng/mL.   Patient  denies any recent concerns for infections. She denies any other sign of inflammation such as painful/swollen joints or skin rashes.   She reports that her weight fluctuates around 180lbs. Patient notes that her starting-weight before surgery was 250 pounds about two years ago. She continues to  follow with her nutritional therapist and surgery team.   Patient complains of generalized fatigue not specific to any local area. She reports that her fatigue is intermittent. Her fatigue and energy level has worsened recently.   Patient does not have any menstrual cycles or other reason for blood loss, such as blood in the stools, black stools, gum bleeds, nose bleeds, or blood in urine or new abdominal pain.   She continues to be on estrogen patch.   Patient denies any recent viral infections or new emotional concerns causing depressive-like symptoms.   She takes ferrous sulphate once a day regularly and denies any toxicity issues such as constipation. Patient regularly takes vitamin B complex and vitamin D. Patient reports that she is not on Nexium.   She continues to work with [employer].   MEDICAL HISTORY: Past Medical History:  Diagnosis Date  . Allergy   . Anemia   . Anxiety   . Apnea 12/08/2019  . Arthritis   . Asthma   . Back pain   . Constipation   . Exertional dyspnea 03/14/2020  . Fatty liver   . GERD (gastroesophageal reflux disease)   . H/O: hysterectomy   . Hx of endometriosis   . Inappropriate sinus tachycardia (HCC) 12/08/2019  . Insomnia   . Joint pain   . Lactose intolerance   . Migraine   . Morbid obesity (HCC) 12/08/2019  . Osteoporosis   . Pneumonia   . Pre-diabetes   . Urticaria   . Vitamin D deficiency     SURGICAL HISTORY: Past Surgical History:  Procedure Laterality Date  . 24 HOUR PH STUDY N/A 01/12/2018   Procedure: 24 HOUR PH STUDY;  Surgeon: Napoleon Form, MD;  Location: [facility];  Service: Endoscopy;  Laterality: N/A;  . ABDOMINAL HYSTERECTOMY  12/12/2003  . ADENOIDECTOMY  05/2011  . ANKLE ARTHROSCOPY  12/29/2007   right; with extensive debridement  . BLADDER NECK RECONSTRUCTION  01/14/2011   Procedure: BLADDER NECK REPAIR;  Surgeon: Reva Bores, MD;  Location: [facility];  Service: Gynecology;  Laterality: N/A;   Laparoscopic Repair of Incidental Cystotomy  . BREAST REDUCTION SURGERY  08/05/2011   Procedure: MAMMARY REDUCTION  (BREAST);  Surgeon: Louisa Second, MD;  Location: [facility];  Service: Plastics;  Laterality: Bilateral;  bilateral  . CESAREAN SECTION  12/29/2000; 1994  . CHOLECYSTECTOMY     age 13  . DILATION AND CURETTAGE OF UTERUS  07/08/2003   open laparoscopy  . ESOPHAGEAL MANOMETRY N/A 01/12/2018   Procedure: ESOPHAGEAL MANOMETRY (EM);  Surgeon: Napoleon Form, MD;  Location: [facility];  Service: Endoscopy;  Laterality: N/A;  . GASTRIC ROUX-EN-Y N/A 03/19/2022   Procedure: LAPAROSCOPIC ROUX-EN-Y GASTRIC BYPASS WITH UPPER ENDOSCOPY;  Surgeon: Gaynelle Adu, MD;  Location: [facility];  Service: General;  Laterality: N/A;  . GIVENS CAPSULE STUDY N/A 06/23/2017   Procedure: GIVENS CAPSULE STUDY;  Surgeon: Benancio Deeds, MD;  Location[facility];  Service: Gastroenterology;  Laterality: N/A;  . HIATAL HERNIA REPAIR N/A 03/19/2022   Procedure: HERNIA REPAIR HIATAL;  Surgeon: Gaynelle Adu, MD;  Location: [facility];  Service: General;  Laterality: N/A;  . LAPAROSCOPIC SALPINGOOPHERECTOMY  01/14/2011   left  .  LAPAROSCOPY N/A 11/01/2022   Procedure: LAPAROSCOPY DIAGNOSTIC; LAPARSCOPIC CLOSURE OF PETERSEN SPACE;  Surgeon: Gaynelle Adu, MD;  Location: [facility];  Service: General;  Laterality: N/A;  DR Gerrit Friends TO ASSIST  . PH IMPEDANCE STUDY N/A 01/12/2018   Procedure: PH IMPEDANCE STUDY;  Surgeon: Napoleon Form, MD;  Location: [facility];  Service: Endoscopy;  Laterality: N/A;  . RESECTION DISTAL CLAVICAL Right 09/25/2017   Procedure: RESECTION DISTAL CLAVICAL;  Surgeon: Bjorn Pippin, MD;  Location: [facility];  Service: Orthopedics;  Laterality: Right;  . RIGHT OOPHORECTOMY  2011   with lysis of adhesions  . SHOULDER ACROMIOPLASTY Right 09/25/2017   Procedure: SHOULDER ACROMIOPLASTY;  Surgeon: Bjorn Pippin, MD;  Location: [facility];  Service: Orthopedics;  Laterality: Right;  . SHOULDER ARTHROSCOPY WITH DEBRIDEMENT AND BICEP TENDON REPAIR Right 09/25/2017   Procedure: SHOULDER ARTHROSCOPY WITH DEBRIDEMENT AND BICEP TENDON REPAIR;  Surgeon: Bjorn Pippin, MD;  Location: [facility];  Service: Orthopedics;  Laterality: Right;  . SHOULDER ARTHROSCOPY WITH ROTATOR CUFF REPAIR Right 09/25/2017   Procedure: SHOULDER ARTHROSCOPY WITH ROTATOR CUFF REPAIR;  Surgeon: Bjorn Pippin, MD;  Location: [facility];  Service: Orthopedics;  Laterality: Right;  . TONSILLECTOMY  as a child  . TUBAL LIGATION  12/29/2000  . UPPER GASTROINTESTINAL ENDOSCOPY    . UPPER GI ENDOSCOPY N/A 11/01/2022   Procedure: UPPER GI ENDOSCOPY;  Surgeon: Gaynelle Adu, MD;  Location: [facility];  Service: General;  Laterality: N/A;    SOCIAL HISTORY: Social History   Socioeconomic History  . Marital status: Divorced    Spouse name: Not on file  . Number of children: 2  . Years of education: 12th  . Highest education level: Not on file  Occupational History  . Occupation: Facilities manager: OTHER    Comment: [employer]  Tobacco Use  . Smoking status: Never  . Smokeless tobacco: Never  Vaping Use  . Vaping status: Never Used  Substance and Sexual Activity  . Alcohol use: Not Currently    Comment: occasional   . Drug use: No  . Sexual activity: Not Currently    Birth control/protection: Surgical  Other Topics Concern  . Not on file  Social History Narrative   Pt lives in 2 story town home with her son   Has 2 children   High school Paramedic for [employer]   Right handed   Social Drivers of Health   Financial Resource Strain: Not on file  Food Insecurity: No Food Insecurity (03/19/2022)   Hunger Vital Sign   . Worried About Programme researcher, broadcasting/film/video in the Last Year: Never true   . Ran Out of Food in the Last Year: Never true  Transportation Needs: No Transportation Needs (03/19/2022)    PRAPARE - Transportation   . Lack of Transportation (Medical): No   . Lack of Transportation (Non-Medical): No  Physical Activity: Not on file  Stress: Not on file  Social Connections: Unknown (09/03/2021)   [facility]   Social Network   . Social Network: Not on file  Intimate Partner Violence: Not At Risk (03/19/2022)   Humiliation, Afraid, Rape, and Kick questionnaire   . Fear of Current or Ex-Partner: No   . Emotionally Abused: No   . Physically Abused: No   . Sexually Abused: No    FAMILY HISTORY: Family History  Problem Relation Age of  Onset  . Diabetes Mother   . Hypertension Mother   . Hyperlipidemia Mother   . Sleep apnea Mother   . Thyroid disease Sister   . Dementia Father   . Heart attack Maternal Grandmother   . Stroke Maternal Grandfather   . Asthma Son   . Healthy Son   . Asthma Daughter   . Healthy Daughter   . Colon cancer Neg Hx   . Colon polyps Neg Hx   . Esophageal cancer Neg Hx   . Rectal cancer Neg Hx   . Stomach cancer Neg Hx     ALLERGIES:  is allergic to aspirin, bactrim [sulfamethoxazole-trimethoprim], doxycycline, ibuprofen, salmon [fish allergy], and zoledronic acid.  MEDICATIONS:  Current Outpatient Medications  Medication Sig Dispense Refill  . acetaminophen (TYLENOL) 325 MG tablet Take 2 tablets (650 mg total) by mouth every 6 (six) hours as needed. 30 tablet 0  . albuterol (VENTOLIN HFA) 108 (90 Base) MCG/ACT inhaler Inhale 1-2 puffs into the lungs every 6 (six) hours as needed for wheezing or shortness of breath. 1 each 0  . benzonatate (TESSALON PERLES) 100 MG capsule Take 1 capsule (100 mg total) by mouth every 6 (six) hours as needed. 30 capsule 1  . calcium carbonate (OS-CAL - DOSED IN MG OF ELEMENTAL CALCIUM) 1250 (500 Ca) MG tablet Take 1 tablet by mouth daily with breakfast.    . clonazePAM (KLONOPIN) 1 MG tablet Take 0.5-1 mg by mouth at bedtime.    . diclofenac (VOLTAREN) 75 MG EC tablet Take 75 mg  by mouth 2 (two) times daily. (Patient not taking: Reported on 05/15/2023)    . EPINEPHrine (EPIPEN 2-PAK) 0.3 mg/0.3 mL IJ SOAJ injection Inject 0.3 mg into the muscle as needed for anaphylaxis. 2 each 2  . ergocalciferol (VITAMIN D2) 1.25 MG (50000 UT) capsule Take 50,000 Units by mouth once a week.    . esomeprazole (NEXIUM) 40 MG capsule Take 1 capsule (40 mg total) by mouth daily. 90 capsule 3  . estradiol (CLIMARA - DOSED IN MG/24 HR) 0.1 mg/24hr patch Place 0.1 mg onto the skin once a week.    [name] HYDROcodone-acetaminophen (NORCO) 7.5-325 MG tablet Take 1 tablet by mouth every 8 (eight) hours as needed for moderate pain. (Patient not taking: Reported on 01/21/2023)    . Multiple Vitamins-Minerals (BARIATRIC MULTIVITAMINS/IRON PO) Take 1 capsule by mouth in the morning and at bedtime.    . ondansetron (ZOFRAN-ODT) 4 MG disintegrating tablet TAKE 1 TABLET BY MOUTH EVERY 8 HOURS AS NEEDED FOR NAUSEA FOR UP TO 7 DAYS    . RESTASIS 0.05 % ophthalmic emulsion Place 1 drop into both eyes 2 (two) times daily as needed (dryness).     No current facility-administered medications for this visit.    REVIEW OF SYSTEMS:    10 Point review of Systems was done is negative except as noted above.   PHYSICAL EXAMINATION:   .LMP 12/27/2003    GENERAL:alert, in no acute distress and comfortable SKIN: no acute rashes, no significant lesions EYES: conjunctiva are pink and non-injected, sclera anicteric OROPHARYNX: MMM, no exudates, no oropharyngeal erythema or ulceration NECK: supple, no JVD LYMPH:  no palpable lymphadenopathy in the cervical, axillary or inguinal regions LUNGS: clear to auscultation b/l with normal respiratory effort HEART: regular rate & rhythm ABDOMEN:  normoactive bowel sounds , non tender, not distended. Extremity: no pedal edema PSYCH: alert & oriented x 3 with fluent speech NEURO: no focal motor/sensory deficits   LABORATORY DATA:  I have reviewed the data as listed  .     Latest Ref Rng & Units 05/21/2023    3:07 AM 05/15/2023    3:10 PM 11/04/2022    3:56 PM  CBC  WBC 4.0 - 10.5 K/uL 4.0  4.1  5.2   Hemoglobin 12.0 - 15.0 g/dL 16.1  09.6  9.9   Hematocrit 36.0 - 46.0 % 33.4  36.9  32.0   Platelets 150 - 400 K/uL 259  230  263    . CBC    Component Value Date/Time   WBC 4.0 05/21/2023 0307   RBC 4.11 05/21/2023 0307   HGB 10.6 (L) 05/21/2023 0307   HGB 11.8 05/15/2023 1510   HCT 33.4 (L) 05/21/2023 0307   HCT 36.9 05/15/2023 1510   PLT 259 05/21/2023 0307   PLT 230 05/15/2023 1510   MCV 81.3 05/21/2023 0307   MCV 80 05/15/2023 1510   MCH 25.8 (L) 05/21/2023 0307   MCHC 31.7 05/21/2023 0307   RDW 12.3 05/21/2023 0307   RDW 12.0 05/15/2023 1510   LYMPHSABS 2.7 05/21/2023 0307   LYMPHSABS 1.0 05/15/2023 1510   MONOABS 0.4 05/21/2023 0307   EOSABS 0.0 05/21/2023 0307   EOSABS 0.0 05/15/2023 1510   BASOSABS 0.0 05/21/2023 0307   BASOSABS 0.0 05/15/2023 1510    .    Latest Ref Rng & Units 05/21/2023    3:07 AM 05/15/2023    3:10 PM 11/04/2022    3:56 PM  CMP  Glucose 70 - 99 mg/dL 99  82  95   BUN 6 - 20 mg/dL 9  9  10    Creatinine 0.44 - 1.00 mg/dL 0.45  4.09  8.11   Sodium 135 - 145 mmol/L 138  141  138   Potassium 3.5 - 5.1 mmol/L 3.2  4.2  4.1   Chloride 98 - 111 mmol/L 104  102  104   CO2 22 - 32 mmol/L 25  25  27    Calcium 8.9 - 10.3 mg/dL 9.0  9.2  9.1   Total Protein 6.5 - 8.1 g/dL 7.3  7.0    Total Bilirubin 0.0 - 1.2 mg/dL 0.4  <9.1    Alkaline Phos 38 - 126 U/L 45  68    AST 15 - 41 U/L 25  26    ALT 0 - 44 U/L 27  29     . Lab Results  Component Value Date   IRON 16 (L) 05/15/2023   TIBC 284 05/15/2023   IRONPCTSAT 6 (LL) 05/15/2023   (Iron and TIBC)  Lab Results  Component Value Date   FERRITIN 250 (H) 05/15/2023   Alpha-Thalassemia GenotypR Order: [id number] Status: Final result   Visible to patient: Yes (seen)   Next appt: 06/19/2020 at 10:40 AM in Family Medicine Gavin Pound Opalski, DO)   Dx: Microcytic anemia    0 Result Notes  Component 3 mo ago  Alpha-Thalassemia Comment:   Comment: (NOTE)  Test: Alpha-Thalassemia, DNA Analysis  Result:      Alpha-+-thalassemia trait, alpha alpha/alpha-  Mutation(s) identified: alpha3.7  Interpretation:  This result is most consistent with this individual being an  unaffected carrier of alpha-thalassemia with a single gene  deletion, also called alpha-+-thalassemia trait.       RADIOGRAPHIC STUDIES: I have personally reviewed the radiological images as listed and agreed with the findings in the report. CT ABDOMEN PELVIS W CONTRAST Result Date: 06/08/2023 CLINICAL DATA:  Right upper quadrant abdominal pain, history of gastric  bypass. EXAM: CT ABDOMEN AND PELVIS WITH CONTRAST TECHNIQUE: Multidetector CT imaging of the abdomen and pelvis was performed using the standard protocol following bolus administration of intravenous contrast. RADIATION DOSE REDUCTION: This exam was performed according to the departmental dose-optimization program which includes automated exposure control, adjustment of the mA and/or kV according to patient size and/or use of iterative reconstruction technique. CONTRAST:  OMNIPAQUE IOHEXOL 300 MG/ML  SOLN COMPARISON:  CT Oct 04, 2022 FINDINGS: Lower chest: No acute abnormality. Hepatobiliary: No suspicious hepatic lesion. Gallbladder surgically absent. No biliary ductal dilation. Pancreas: No pancreatic ductal dilation or evidence of acute inflammation Spleen: No splenomegaly. Adrenals/Urinary Tract: Bilateral adrenal glands appear normal. No hydronephrosis. Kidneys demonstrate symmetric enhancement. Urinary bladder is distended extending above the sacral promontory. Slight asymmetric wall thickening with adjacent calcification along the anterior left side of the urinary bladder on image 68/2. Stomach/Bowel: Radiopaque enteric contrast material traverses distal loops of small bowel. Surgical change of prior gastric bypass. No evidence of  bowel obstruction. Distended fluid-filled loops of small bowel with gas fluid levels in the proximal colon. Vascular/Lymphatic: Normal caliber abdominal aorta. Smooth IVC contours. Portal, splenic and superior mesenteric veins are patent. No pathologically enlarged abdominal or pelvic lymph nodes. Reproductive: Status post hysterectomy. No adnexal masses. Other: Small ventral hernia contains fat and nonobstructed portion of bowel. Musculoskeletal: No aggressive lytic or blastic lesion of bone. IMPRESSION: 1. Distended fluid-filled loops of small bowel with gas fluid levels in the proximal colon, suggestive of a nonspecific enteritis. 2. Urinary bladder is distended extending above the sacral promontory. Slight asymmetric wall thickening with adjacent calcification along the anterior left side of the urinary bladder, nonspecific, consider correlation with urinalysis and urology consult. 3. Small ventral hernia contains fat and nonobstructed portion of bowel. Electronically Signed   By: Maudry Mayhew M.D.   On: 06/08/2023 10:12    ASSESSMENT & PLAN:   48 yo female with   1) Microcytic Anemia due to iron deficiency 2) Alpha thalassemia trait PLAN:  -Discussed lab results on 07/02/23 in detail with patient. CBC showed WBC of 5.8K, hemoglobin of 11.3, and platelets of 263K. -her hgb is close to her baseline of high 11-12 with alpha thalassemia trait  -her anemia is not significantly limiting at this time -WBC and platelets normal -CMP shows normal findings and does not explain her elevated ferritin -discussed that based on labs from 1 month ago, her ferritin level is unusually high at 250 ng/mL with her iron saturation of only 6% -discussed that in normal circumstances, ferritin goal would be at least 100 along with 20-30% iron saturation -discussed that unusually high ferritin can be from several factors, including if there is lot of inflammation related to a different process, inflammation in the  liver, or RBC hemolysis in her blood sample.  -it is likely that her ferritin level one month ago was not accurate given her unusual iron parameters of low iron saturation and elevated ferritin -other labs from January show B12 >2000 and normal TSH at 1.470 uIU/mL -iron labs from today shows iron saturation 24% -ferritin and B12 labs are pending at time of clinical visit -will review other iron labs from today -06/04/2023 CT abdomen scan showed Distended fluid-filled loops of small bowel with gas fluid levels in the proximal colon, suggestive of a nonspecific enteritis. Discussed that this could be a GI infection causing abnormal inflammation and elevated ferritin.  -She currently takes ferrous sulphate once daily -discussed option of IV iron to replace her  iron if needed -continue vitamin D and vitamin B complex -continue to work with dietitian to optimize nutritional diet  -answered all of patient's questions in detail  FOLLOW-UP: ***  The total time spent in the appointment was *** minutes* .  All of the patient's questions were answered with apparent satisfaction. The patient knows to call the clinic with any problems, questions or concerns.   Wyvonnia Lora MD MS AAHIVMS [facility] Hematology/Oncology Physician [facility]  .*Total Encounter Time as defined by the Centers for Medicare and Medicaid Services includes, in addition to the face-to-face time of a patient visit (documented in the note above) non-face-to-face time: obtaining and reviewing outside history, ordering and reviewing medications, tests or procedures, care coordination (communications with other health care professionals or caregivers) and documentation in the medical record.    I,Mitra Faeizi,acting as a Neurosurgeon for Wyvonnia Lora, MD.,have documented all relevant documentation on the behalf of Wyvonnia Lora, MD,as directed by  Wyvonnia Lora, MD while in the presence of Wyvonnia Lora, MD.  ***

## 2023-07-15 DIAGNOSIS — F330 Major depressive disorder, recurrent, mild: Secondary | ICD-10-CM | POA: Diagnosis not present

## 2023-07-15 DIAGNOSIS — F411 Generalized anxiety disorder: Secondary | ICD-10-CM | POA: Diagnosis not present

## 2023-07-22 DIAGNOSIS — R3914 Feeling of incomplete bladder emptying: Secondary | ICD-10-CM | POA: Diagnosis not present

## 2023-07-22 DIAGNOSIS — N9972 Accidental puncture and laceration of a genitourinary system organ or structure during other procedure: Secondary | ICD-10-CM | POA: Diagnosis not present

## 2023-07-22 DIAGNOSIS — N3020 Other chronic cystitis without hematuria: Secondary | ICD-10-CM | POA: Diagnosis not present

## 2023-07-23 DIAGNOSIS — G8929 Other chronic pain: Secondary | ICD-10-CM | POA: Diagnosis not present

## 2023-07-23 DIAGNOSIS — M5030 Other cervical disc degeneration, unspecified cervical region: Secondary | ICD-10-CM | POA: Diagnosis not present

## 2023-07-23 DIAGNOSIS — M25511 Pain in right shoulder: Secondary | ICD-10-CM | POA: Diagnosis not present

## 2023-07-23 DIAGNOSIS — G894 Chronic pain syndrome: Secondary | ICD-10-CM | POA: Diagnosis not present

## 2023-07-23 DIAGNOSIS — M47812 Spondylosis without myelopathy or radiculopathy, cervical region: Secondary | ICD-10-CM | POA: Diagnosis not present

## 2023-07-23 DIAGNOSIS — Z79891 Long term (current) use of opiate analgesic: Secondary | ICD-10-CM | POA: Diagnosis not present

## 2023-07-29 DIAGNOSIS — F330 Major depressive disorder, recurrent, mild: Secondary | ICD-10-CM | POA: Diagnosis not present

## 2023-07-29 DIAGNOSIS — F411 Generalized anxiety disorder: Secondary | ICD-10-CM | POA: Diagnosis not present

## 2023-08-12 DIAGNOSIS — F330 Major depressive disorder, recurrent, mild: Secondary | ICD-10-CM | POA: Diagnosis not present

## 2023-08-12 DIAGNOSIS — F411 Generalized anxiety disorder: Secondary | ICD-10-CM | POA: Diagnosis not present

## 2023-08-15 DIAGNOSIS — M47812 Spondylosis without myelopathy or radiculopathy, cervical region: Secondary | ICD-10-CM | POA: Diagnosis not present

## 2023-08-26 ENCOUNTER — Other Ambulatory Visit: Payer: Medicaid Other

## 2023-08-26 ENCOUNTER — Ambulatory Visit: Payer: Medicaid Other | Admitting: Hematology

## 2023-09-02 DIAGNOSIS — F330 Major depressive disorder, recurrent, mild: Secondary | ICD-10-CM | POA: Diagnosis not present

## 2023-09-02 DIAGNOSIS — F411 Generalized anxiety disorder: Secondary | ICD-10-CM | POA: Diagnosis not present

## 2023-09-16 DIAGNOSIS — F411 Generalized anxiety disorder: Secondary | ICD-10-CM | POA: Diagnosis not present

## 2023-09-16 DIAGNOSIS — F330 Major depressive disorder, recurrent, mild: Secondary | ICD-10-CM | POA: Diagnosis not present

## 2023-09-22 DIAGNOSIS — M47812 Spondylosis without myelopathy or radiculopathy, cervical region: Secondary | ICD-10-CM | POA: Diagnosis not present

## 2023-09-25 ENCOUNTER — Encounter: Payer: Self-pay | Admitting: Skilled Nursing Facility1

## 2023-09-25 ENCOUNTER — Encounter: Payer: Medicaid Other | Attending: General Surgery | Admitting: Skilled Nursing Facility1

## 2023-09-25 DIAGNOSIS — E66811 Obesity, class 1: Secondary | ICD-10-CM | POA: Insufficient documentation

## 2023-09-25 DIAGNOSIS — E6609 Other obesity due to excess calories: Secondary | ICD-10-CM | POA: Insufficient documentation

## 2023-09-25 DIAGNOSIS — Z6833 Body mass index (BMI) 33.0-33.9, adult: Secondary | ICD-10-CM | POA: Insufficient documentation

## 2023-09-25 NOTE — Progress Notes (Signed)
 Bariatric Nutrition Follow-Up Visit Medical Nutrition Therapy   Surgery date: 03/19/22 Surgery type: RYGB  NUTRITION ASSESSMENT  Anthropometrics  Start weight at [facility]: 241.4 lbs (date: 11/22/2021)  Height: 64 in Weight today: 183.5 lbs.  Clinical  Medical hx: Allergies, Anemia, Anxiety, arthritis, asthma, GERD, kidney disease, osteoporosis Medications: Esomeprazole , one a day vitamin, vit D, Calcium  Labs: Hemoglobin 11.3; Hematocrit 35.2; Vit D 110.0; B12 2,200; A1c 5.5 Notable signs/symptoms: nothing noted Any previous deficiencies? No Bowel Habits: Every day to every other day no complaints   Body Composition Scale 05/21/2022 08/20/2022 11/11/2022 04/01/2023 09/25/2023  Current Body Weight 215.1 198.6 189.3 185.1 183.5  Total Body Fat % 41.5 39.4 37.9 37.2 36.9  Visceral Fat 13 11 10 10 10   Fat-Free Mass % 58.4 60.5 62.0 62.7 63   Total Body Water  % 43.7 44.7 45.5 45.8 46  Muscle-Mass lbs 31.3 31.0 30.9 30.9 30.9  BMI 36.8 33.9 32.3 31.6 31.3  Body Fat Displacement              Torso  lbs 55.3 48.4 44.4 42.6 41.8         Left Leg  lbs 11.0 9.6 8.8 8.5 8.3         Right Leg  lbs 11.0 9.6 8.8 8.5 8.3         Left Arm  lbs 5.5 4.8 4.4 4.2 4.1         Right Arm   lbs 5.5 4.8 4.4 4.2 4.1     Lifestyle & Dietary Hx  Pt states she is thinking about getting a paniculectomy.  Pt states she gets really full off lettuce.  Pt states she is really struggling with the idea complex carbohydrates are good sand she should not avoid them completely.   Estimated daily fluid intake: 64+ oz Estimated daily protein intake: 60-70 g Supplements: multivitamin and calcium Current average weekly physical activity: walking 2-3 times a week for 30-40 minutes.  24-Hr Dietary Recall: wakes around 9:30 First Meal 10:30am: pre-made protein shake (pt states she is not a breakfast person) Snack: cheese stick  Second Meal: keto [place] sandwich + quest chips Snack: quest protein chips, fruit Third  Meal: chicken or salmon or tuna, broccoli or corn Snack: sf pop cycle or coffee with protein shake Beverages: water , coffee, vitamin water  zero, water  + flavoring   Post-Op Goals/ Signs/ Symptoms Using straws: no Drinking while eating: no Chewing/swallowing difficulties: no Changes in vision: no Changes to mood/headaches: no Hair loss/changes to skin/nails: no Difficulty focusing/concentrating: no Sweating: no Limb weakness: no Dizziness/lightheadedness: no Palpitations: no  Carbonated/caffeinated beverages: no N/V/D/C/Gas: no Abdominal pain: no Dumping syndrome: no   NUTRITION DIAGNOSIS  Overweight/obesity (Ashley-3.3) related to past poor dietary habits and physical inactivity as evidenced by completed bariatric surgery and following dietary guidelines for continued weight loss and healthy nutrition status. Continued  Why you need complex carbohydrates: complex carbohydrates are required to have a healthy diet. Whole grains provide fiber which can help with blood glucose levels and help keep you satiated. Fruits and starchy and non-starch vegetables provide essential vitamins and minerals required for immune function, eyesight support, brain support, bone density, wound healing and many other functions within the body. According to the current evidenced based 2020-2025 Dietary Guidelines for Americans, complex carbohydrates are part of a healthy eating pattern which is associated with a decreased risk for type 2 diabetes, cancers, and cardiovascular disease.   NUTRITION INTERVENTION Nutrition counseling (C-1) and education (E-2) to facilitate bariatric  surgery goals, including: The importance of consuming adequate calories as well as certain nutrients daily due to the body's need for essential vitamins, minerals, and fats The importance of daily physical activity and to reach a goal of at least 150 minutes of moderate to vigorous physical activity weekly (or as directed by their physician)  due to benefits such as increased musculature and improved lab values The importance of intuitive eating specifically learning hunger-satiety cues and understanding the importance of learning a new body: The importance of mindful eating to avoid grazing behaviors   Goals - Continue: take multivitamin (with iron ) and calcium - Continue: aim for at least 2 servings of non-starchy vegetables a day. - Re-engage times 2: increase physical activity; Check out BELT. -add in resistance 2 times a week at least   Handouts Previously Provided Include  Maintenance Plan (1 Year) MyPlate  Learning Style & Readiness for Change Teaching method utilized: Visual & Auditory  Demonstrated degree of understanding via: Teach Back  Readiness Level: Preparation Barriers to learning/adherence to lifestyle change: nothing identified  RD's Notes for Next Visit Assess adherence to pt chosen goals  MONITORING & EVALUATION Dietary intake, weekly physical activity, body weight.  Next Steps Patient is to call or email with any future questions or concerns

## 2023-09-30 ENCOUNTER — Ambulatory Visit: Payer: Medicaid Other | Admitting: Dietician

## 2023-09-30 DIAGNOSIS — F411 Generalized anxiety disorder: Secondary | ICD-10-CM | POA: Diagnosis not present

## 2023-09-30 DIAGNOSIS — F330 Major depressive disorder, recurrent, mild: Secondary | ICD-10-CM | POA: Diagnosis not present

## 2023-10-14 DIAGNOSIS — F411 Generalized anxiety disorder: Secondary | ICD-10-CM | POA: Diagnosis not present

## 2023-10-14 DIAGNOSIS — F330 Major depressive disorder, recurrent, mild: Secondary | ICD-10-CM | POA: Diagnosis not present

## 2023-10-22 DIAGNOSIS — E559 Vitamin D deficiency, unspecified: Secondary | ICD-10-CM | POA: Diagnosis not present

## 2023-10-22 DIAGNOSIS — D509 Iron deficiency anemia, unspecified: Secondary | ICD-10-CM | POA: Diagnosis not present

## 2023-10-22 DIAGNOSIS — Z9884 Bariatric surgery status: Secondary | ICD-10-CM | POA: Diagnosis not present

## 2023-10-22 DIAGNOSIS — E538 Deficiency of other specified B group vitamins: Secondary | ICD-10-CM | POA: Diagnosis not present

## 2023-10-27 DIAGNOSIS — F411 Generalized anxiety disorder: Secondary | ICD-10-CM | POA: Diagnosis not present

## 2023-10-27 DIAGNOSIS — F330 Major depressive disorder, recurrent, mild: Secondary | ICD-10-CM | POA: Diagnosis not present

## 2023-10-30 DIAGNOSIS — Z79899 Other long term (current) drug therapy: Secondary | ICD-10-CM | POA: Diagnosis not present

## 2023-10-30 DIAGNOSIS — K219 Gastro-esophageal reflux disease without esophagitis: Secondary | ICD-10-CM | POA: Diagnosis not present

## 2023-10-30 DIAGNOSIS — M47812 Spondylosis without myelopathy or radiculopathy, cervical region: Secondary | ICD-10-CM | POA: Diagnosis not present

## 2023-11-05 ENCOUNTER — Telehealth: Payer: Self-pay | Admitting: Nurse Practitioner

## 2023-11-05 NOTE — Telephone Encounter (Signed)
 PT NEED TO RESCHEDULE 7/9 APPT DUE TO PROVIDER NOT BEING IN THE OFFICE THAT DAY

## 2023-11-12 ENCOUNTER — Ambulatory Visit: Payer: Medicaid Other | Admitting: Nurse Practitioner

## 2023-11-13 DIAGNOSIS — F330 Major depressive disorder, recurrent, mild: Secondary | ICD-10-CM | POA: Diagnosis not present

## 2023-11-13 DIAGNOSIS — F411 Generalized anxiety disorder: Secondary | ICD-10-CM | POA: Diagnosis not present

## 2023-11-26 DIAGNOSIS — F330 Major depressive disorder, recurrent, mild: Secondary | ICD-10-CM | POA: Diagnosis not present

## 2023-11-26 DIAGNOSIS — F411 Generalized anxiety disorder: Secondary | ICD-10-CM | POA: Diagnosis not present

## 2023-12-08 DIAGNOSIS — F330 Major depressive disorder, recurrent, mild: Secondary | ICD-10-CM | POA: Diagnosis not present

## 2023-12-08 DIAGNOSIS — F411 Generalized anxiety disorder: Secondary | ICD-10-CM | POA: Diagnosis not present

## 2023-12-09 ENCOUNTER — Other Ambulatory Visit (HOSPITAL_COMMUNITY): Payer: Self-pay | Admitting: General Surgery

## 2023-12-09 DIAGNOSIS — R140 Abdominal distension (gaseous): Secondary | ICD-10-CM

## 2023-12-09 DIAGNOSIS — Z9884 Bariatric surgery status: Secondary | ICD-10-CM

## 2023-12-11 DIAGNOSIS — F411 Generalized anxiety disorder: Secondary | ICD-10-CM | POA: Diagnosis not present

## 2023-12-11 DIAGNOSIS — F330 Major depressive disorder, recurrent, mild: Secondary | ICD-10-CM | POA: Diagnosis not present

## 2023-12-12 ENCOUNTER — Ambulatory Visit (HOSPITAL_COMMUNITY)
Admission: RE | Admit: 2023-12-12 | Discharge: 2023-12-12 | Disposition: A | Discharge status: Home / Self Care | Source: Ambulatory Visit | Attending: General Surgery | Admitting: General Surgery

## 2023-12-12 DIAGNOSIS — R140 Abdominal distension (gaseous): Secondary | ICD-10-CM | POA: Diagnosis not present

## 2023-12-12 DIAGNOSIS — Z9884 Bariatric surgery status: Secondary | ICD-10-CM | POA: Diagnosis not present

## 2023-12-12 DIAGNOSIS — Z9049 Acquired absence of other specified parts of digestive tract: Secondary | ICD-10-CM | POA: Diagnosis not present

## 2023-12-12 MED ORDER — IOHEXOL 9 MG/ML PO SOLN
ORAL | Status: AC
  Filled 2023-12-12: qty 1000

## 2023-12-12 MED ORDER — IOHEXOL 300 MG/ML  SOLN
100.0000 mL | Freq: Once | INTRAMUSCULAR | Status: AC | PRN
  Administered 2023-12-12: 100 mL via INTRAVENOUS

## 2023-12-12 MED ORDER — IOHEXOL 9 MG/ML PO SOLN: 1000.0000 mL | ORAL | Status: AC

## 2023-12-17 DIAGNOSIS — G8929 Other chronic pain: Secondary | ICD-10-CM | POA: Diagnosis not present

## 2023-12-17 DIAGNOSIS — M25511 Pain in right shoulder: Secondary | ICD-10-CM | POA: Diagnosis not present

## 2023-12-17 DIAGNOSIS — G894 Chronic pain syndrome: Secondary | ICD-10-CM | POA: Diagnosis not present

## 2023-12-17 DIAGNOSIS — M5030 Other cervical disc degeneration, unspecified cervical region: Secondary | ICD-10-CM | POA: Diagnosis not present

## 2023-12-17 DIAGNOSIS — Z79891 Long term (current) use of opiate analgesic: Secondary | ICD-10-CM | POA: Diagnosis not present

## 2023-12-17 DIAGNOSIS — M47812 Spondylosis without myelopathy or radiculopathy, cervical region: Secondary | ICD-10-CM | POA: Diagnosis not present

## 2023-12-17 NOTE — Progress Notes (Signed)
 [facility]  New Patient Visit    Referring Provider Arloa Suzen RAMAN, FNP   Assessment   [name] is a 48 y.o. female with PMH significant for  has a past medical history of GERD (gastroesophageal reflux disease) and migraine headaches. referred from Arloa Suzen RAMAN, FNP presenting for new patient consultation.    Patient presents with chronic pain in right shoulder and cervical area. She denies radical pain. Onset of pain began after second shoulder surgery. Patient has also been diagnosed with Eagle syndrome and reports having an intervention on styloid process without benefit. Pain is located in  mid to right cervical area. She also reports pain in shoulder joint.  Patient describes pain in right ear worse with lifting and overhead activities. Patient also reports throat pain. She has had short-lived benefit following shoulder injection reporting pain relief for one hour. The pain is worse with climbing stairs, lifting, and overhead activities. Patient reports pain improved with rest, lying down, medication, and heat. Patient also reports benefit with Botox injections in cervical area received for past migraines. Patient has been taking Norco 7.5 mg TID to manage pain. Her current pain provider no longer accepts her insurance. We discussed her chronic use of pain management today.    Interval Update: December 17, 2023 The patient returns to clinic today for follow up appointment.  Patient was last seen in clinic on 10/30/23.    At that time we did  bilateral cervical RFA. She denies benefit. The procedure reduced her pain by approximately 30 percent, but the effect only lasted for a day or two. She experiences intermittent headaches, which she believes originate from her neck. These headaches are not severe and can be alleviated by loosening her hair wrap. She also reports pain on the right side of her neck, which is exacerbated by overhead activities such as  reaching or doing laundry. Rest, heat application, ice, and medication provide some relief. She tells me that past injections in her face provided benefit. She has a history of back issues due to arthritis in her spine, which was diagnosed long ago.She has undergone physical therapy and chiropractic treatment for these issues.  Current medication regimen includes Norco 7.5 mg TID. Today she rates her pain on the NRS scale as 5 without medications  and describes it as  aching and constant. Denies bowel or bladder incontinence, saddle anesthesia, or upper or lower extremity weakness.         1. Chronic use of opiate for therapeutic purpose      2. Facet arthropathy, cervical  HYDROcodone -acetaminophen  (NORCO) 7.5-325 mg per tablet   HYDROcodone -acetaminophen  (NORCO) 7.5-325 mg per tablet   HYDROcodone -acetaminophen  (NORCO) 7.5-325 mg per tablet   ToxASSURE Select 13    3. Pain syndrome, chronic  HYDROcodone -acetaminophen  (NORCO) 7.5-325 mg per tablet   HYDROcodone -acetaminophen  (NORCO) 7.5-325 mg per tablet   HYDROcodone -acetaminophen  (NORCO) 7.5-325 mg per tablet    4. Degeneration of cervical intervertebral disc  HYDROcodone -acetaminophen  (NORCO) 7.5-325 mg per tablet   HYDROcodone -acetaminophen  (NORCO) 7.5-325 mg per tablet   HYDROcodone -acetaminophen  (NORCO) 7.5-325 mg per tablet   ToxASSURE Select 13       Plan   1.  Urine drug screen was not obtained.  2. [facility] Reporting is reviewed today and consistent with current reported medication and prior physicians.      Narcotic Violations: none Current Medication Regimen -stable Side effects -minimal   Activity Level- adequate [place] Behavior- nil Narcotic Contract- not obtained  3. Opioid risk tool score:   - 1 low risk         OPIOID RISK TOOL  More data exists      07/22/2023  Opioid Risk Tool  Family history alcohol  0  Family history illegal drugs 0  Family history rx drugs 0  Personal history  alcohol 0  Personal history illegal drugs 0  Personal history rx drugs 0  Age between 16-45 years 1*  History of preadolescent sexual abuse 0  ADD, OCD, bipolar, schizophrenia 0  Depression 0  Scoring Total 1 *  Interpretation Low risk for opioid abuse *    Details      * Patient-reported             4. Opioid Medications:  - Continue Norco 7.5-325 mg TID. FD 8/20, 9/19, 10/19  UDS today      5. Adjuvant Medications: - none prescribed   - Optimize conservative measures including heat/ice therapy to affected site, acetaminophen  and NSAIDs as needed and per label recommendations, and trial OTC lidocaine  patches.  6. Specialized treatments:   - Patient previously completed PT and continues with HEP at least 15-20 mins/day, 3-4 days/week  7.  Imaging:   - none  8.  Procedures:  - Denies benefit following BL C3-5 RFA      - Risks and benefits of procedure including but not limited to bleeding, infection, nerve damage, and damage to surrounding structures discussed with patient and all questions were answered to his/her satisfaction.   9. No follow-ups on file.  10. The patient's blood pressure was taken and reported as  114/71   History of Present Illness: Chief Complaint:  No chief complaint on file.  The most significant area of pain is neck. The pain began 2023 and has been present >3 months. Pain started with: shoulder surgery Patient denies numbness/tingling and denies weakness.   Patient is not incontinent of bowel/bladder. The pain is described as aching, sharp, and constant. The patient's pain score is rated as 5/10 on average over the last 7 days. The pain improves and is better with rest, lying down, heat, and medications. The pain is made worse with bending and lifting. The patient is unable to do the following activities 2/2 pain: exercise, house chores, and work.  Denies symptoms including fevers, chills, constipation, nausea, vomiting,  lightheadedness, dizziness, or excessive sedation. Denies any side effects to current medication regimen.    Previous Pain Course/Interventions: Has PT been completed for this pain complaint? yes  - Location of PT if completed outside of [facility]: [facility] Has chiropractic been completed for this pain complaint? yes  Medications utilized:  NSAID's: celecoxib  (Celebrex ), diclofenac  (Cataflam, Voltaren ), ibuprofen  (Motrin ), and meloxicam  (Mobic ) Muscle Relaxants: carisoprodol (Soma), cyclobenzaprine  (Flexeril ), metaxalone (Skelaxin), methocarbamol  (Robaxin ), and tizanidine  (Zanaflex ) Anti-Depressants: amitriptyline  (Elavil ) and cymbalta (Duloxetine) Membrane Stabilizers: gabapentin  (Neurontin ), pregabalin  (Lyrica ), topiramate  (Topamax ), and levetiracetam (Keppra) Opioids: hydrocodone  and tramadol  (Ultram ) Other:   Procedures: -12/12/20 CESI- no benefit [facility] - 08/15/23 BL C3-5 MBB - 5/19/245 Bl C3-5 MBB  -10/30/23 BL C3-6 RFA- no benefit  Diagnostic Studies: (I personally reviewed all applicable diagnostic imaging pertinent to this visit):   Cervical XR 11/21/20 Joint spaces: Mild intervertebral disc height loss with a small anterior  osteophytes at C6-7.   MRI Cervical 2022 FINDINGS: #  The craniocervical junction is normal. #  Cervical alignment is preserved.  #  Vertebral body heights are well maintained.  #  The marrow signal intensity  is normal.  #  No abnormal cord signal. #  Incidental findings: None.   #  At the level of C2-3,  Normal.   #  At the level of C3-4,  Normal.   #  At the level of C4-5,  Normal.   #  At the level of C5-6,  minimal disc bulging. No significant central canal stenosis or neural foraminal.   #  At the level of C6-7,  Normal.   #  At the level of C7-T1,  Normal.      IMPRESSION:    Middle cerebral disc bulging C5-6. No significant central canal stenosis or neuroforaminal narrowing. No disc herniations.  REVIEW OF SYSTEMS:  A complete  13 point review of systems was performed and was noncontributory except as noted in history of present illness, see intake form from today's examination for full details   Health Status  Current medications: Medication List reviewed and updated today. Current Home Medications  Medication Sig Last Dose  acetaminophen  (TYLENOL ) 500 mg tablet Take two tablets (1,000 mg dose) by mouth.   albuterol  sulfate HFA (PROVENTIL ,VENTOLIN ,PROAIR ) 108 (90 Base) MCG/ACT inhaler albuterol  sulfate HFA 90 mcg/actuation aerosol inhaler  TAKE 2 PUFFS BY MOUTH EVERY 6 HOURS AS NEEDED FOR WHEEZE OR SHORTNESS OF BREATH   BREO ELLIPTA  100-25 mcg/inh inhalation powder Inhale one puff into the lungs daily.   cetirizine  (ZYRTEC ) 5 mg tablet Take one tablet (5 mg dose) by mouth daily.   cycloSPORINE (RESTASIS) 0.05 % ophthalmic emulsion Restasis 0.05 % eye drops in a dropperette   cycloSPORINE (RESTASIS) 0.05 % ophthalmic emulsion Apply to eye.   diclofenac  sodium (VOLTAREN ) 1 % gel    EPINEPHrine  (AUVI-Q ,EPIPEN ) 0.3 mg/0.3 mL injection Auvi-Q  0.3 mg/0.3 mL injection, auto-injector  USE AS DIRECTED   ergocalciferol  (ERGOCALCIFEROL ) 1.25 MG (50000 UT) capsule ergocalciferol  (vitamin D2) 1,250 mcg (50,000 unit) capsule  TAKE 1 CAPSULE (50,000 UNITS TOTAL) BY MOUTH 2 (TWO) TIMES A WEEK.   esomeprazole  (NEXIUM ) 40 mg capsule Take by mouth.   estradiol (CLIMARA) 0.1 mg/24 hours SMARTSIG:1 Patch(s) T-DERMAL Once a Week   ferrous sulfate  (FERROUS SULFATE ) 325 (65 FE) MG tablet Take one tablet (325 mg dose) by mouth with breakfast.   HYDROcodone -acetaminophen  (NORCO) 7.5-325 mg per tablet Take one tablet by mouth every 8 (eight) hours as needed for Pain for up to 30 days. FD 12/24/23 Max Daily Amount: 3 tablets   HYDROcodone -acetaminophen  (NORCO) 7.5-325 mg per tablet Take one tablet by mouth every 8 (eight) hours as needed for Pain for up to 30 days. FD 01/23/24 Max Daily Amount: 3 tablets   HYDROcodone -acetaminophen  (NORCO)  7.5-325 mg per tablet Take one tablet by mouth every 8 (eight) hours as needed for Pain for up to 30 days. FD 02/22/24 Max Daily Amount: 3 tablets   naproxen  (NAPROSYN ) 500 mg tablet naproxen  500 mg tablet   omeprazole (PRILOSEC) 40 mg capsule omeprazole 40 mg capsule,delayed release  TAKE 1 CAPSULE (40 MG TOTAL) BY MOUTH 2 TIMES DAILY FOR 30 DAYS.   sucralfate  (CARAFATE ) 1 g tablet sucralfate  1 gram tablet   tretinoin (RETIN-A) 0.025% cream tretinoin 0.025 % topical cream  APPLY APPLICATIONS APPLY A PEA-SIZED AMOUNT TO FACE AT BEDTIME   zolpidem  tartrate (AMBIEN  CR) 12.5 mg CR tablet Take one tablet (12.5 mg dose) by mouth at bedtime.       Problem list: Problem list reviewed and updated at today's visit Patient Active Problem List  Diagnosis  . Pain syndrome, chronic  .  Facet arthropathy, cervical  . Degeneration of cervical intervertebral disc  . Chronic use of opiate for therapeutic purpose      OBJECTIVE: There is no height or weight on file to calculate BMI.  Physical Exam:  Vitals:   12/17/23 1457  BP: 114/71  Pulse: 65  Resp: 18  SpO2: 100%     GENERAL/PSYCHIATRIC:  Patient is alert, oriented, answers questions appropriately. In no apparent distress. Interpersonal behavior socially appropriate. Mood appropriate to interview. HEENT:  Normocepalic, conjuctiva clear, mucous membranes moist. SKIN:  warm and dry. CARDIOVASCULAR:  Well perfused. No evidence of cyanosis/edema.   RESPIRATORY:  Respirations symetrical and without exertion. ABDOMEN:  soft. EXTREMITIES: Upper and lower extremity motor strength appears to be grossly intact. Grip strength is intact. NEUROLOGICAL:  No gross sensory deficits noted.  SPINE: straight GAIT: normal  Sensory: Fine sensation: Normal in UE and LE, Bilaterally symmetric Temperature sensation: Grossly normal in UE and LE, Bilaterally symmetric  Pain (Neuro/Musculoskeletal): Occipital: negative tinel's sign over greater and lesser  occipital nerves Cervical: with paraspinal tenderness, with significant pain with facet loading, spurling negative b/l, negative hoffman's sign, posterior radiation to the posterior occiput  Peripheral Joints: Shoulder: normal ROM without pain. without obvious deformity or noted atrophy. with tenderness over bilateral AC joint, LHBT (long head biceps tendon), clavicle, scapula, trapezius, pectoralis, or deltoid. negative Neer and Hawkin's sign bilaterally. normal motor power in flexors/extensors, adductors/ABDuctors, and internal/external rotators. Strong brachial and axillary pulses.  Motor: Muscle Left Right  Grip (C8) 5/5 5/5  Finger Intrinsics (T1) 5/5 5/5  Wrist EXT (C6) 5/5 5/5  Wrist FLEX  5/5 5/5  Elbow FLEX (C5) 5/5 5/5  Elbow EXT (C7) 5/5 5/5  Shoulder ABD 5/5 5/5   Muscle Left Right  EHL (L5) 5/5 5/5  TA (L4) 5/5 5/5  GS (S1) 5/5 5/5  Quad (L3) 5/5 5/5  HS (L2) 5/5 5/5  Hip Flex  5/5 5/5  Hip ABD 5/5 5/5  Hip ADD 5/5 5/5    Patient's pain was assessed, documented as positive, unless stated in the HPI, and a follow up plan has been documented in the Plan. Patient's medication list was reviewed and updated if applicable.  Body Mass Index (BMI) screening was documented and if the patient's BMI was greater than 25 or less than 18.5, the patient was asked to follow up with their PCP for a full assessment regarding weight management. If Fluoroscopy was used during a procedure, the radiation exposure time and number of images were documented within the record.  I attest that during this clinical encounter, confirmed written consent and verbal authorization was received to use [employer] to ambiently capture, record, and transcribe the conversation between myself and [name]. The transcript generated by [employer] was reviewed for accuracy and completeness, and accurately reflects the context of the clinical interaction. Documentation for time-based billing:  Total time  spent of date of service was 20 minutes.  Patient care activities included preparing to see the patient such as reviewing the patient record, performing a medically appropriate history and physical examination, counseling and educating the patient, family, and/or caregiver, ordering prescription medications, tests, or procedures, and documenting clinical information in the electronic or other health record.  Joyce Brunson ,DNP    Treatment plan fully discussed and agreed upon with patient. All questions were answered.  Merlynn Richardson Mt, NP  *Some images could not be shown.

## 2023-12-18 ENCOUNTER — Encounter: Payer: Self-pay | Admitting: Dietician

## 2023-12-18 ENCOUNTER — Encounter: Attending: General Surgery | Admitting: Dietician

## 2023-12-18 ENCOUNTER — Other Ambulatory Visit: Payer: Self-pay

## 2023-12-18 DIAGNOSIS — E669 Obesity, unspecified: Secondary | ICD-10-CM | POA: Diagnosis not present

## 2023-12-18 DIAGNOSIS — D500 Iron deficiency anemia secondary to blood loss (chronic): Secondary | ICD-10-CM

## 2023-12-18 NOTE — Progress Notes (Signed)
 Bariatric Nutrition Follow-Up Visit Medical Nutrition Therapy   Surgery date: 03/19/22 Surgery type: RYGB  NUTRITION ASSESSMENT  Anthropometrics  Start weight at [facility]: 241.4 lbs (date: 11/22/2021)  Height: 64 in Weight today: pt declined  Clinical  Medical hx: Allergies, Anemia, Anxiety, arthritis, asthma, GERD, kidney disease, osteoporosis Medications: Esomeprazole , one a day vitamin, vit D, Calcium  Labs: Hemoglobin 11.3; Hematocrit 35.2; Vit D 110.0; B12 1,721; A1c 5.5 Notable signs/symptoms: nothing noted Any previous deficiencies? No Bowel Habits: Every day to every other day no complaints   Body Composition Scale 05/21/2022 08/20/2022 11/11/2022 04/01/2023 09/25/2023  Current Body Weight 215.1 198.6 189.3 185.1 183.5  Total Body Fat % 41.5 39.4 37.9 37.2 36.9  Visceral Fat 13 11 10 10 10   Fat-Free Mass % 58.4 60.5 62.0 62.7 63   Total Body Water  % 43.7 44.7 45.5 45.8 46  Muscle-Mass lbs 31.3 31.0 30.9 30.9 30.9  BMI 36.8 33.9 32.3 31.6 31.3  Body Fat Displacement              Torso  lbs 55.3 48.4 44.4 42.6 41.8         Left Leg  lbs 11.0 9.6 8.8 8.5 8.3         Right Leg  lbs 11.0 9.6 8.8 8.5 8.3         Left Arm  lbs 5.5 4.8 4.4 4.2 4.1         Right Arm   lbs 5.5 4.8 4.4 4.2 4.1     Lifestyle & Dietary Hx  Pt states she is still having hair loss. Pt states her Vit D labs were still higher. Pt states she is getting her vit B12. Pt states she changed to a calcium without Vit D to avoid high vit D labs, but states the new calcium carbonate upset her stomach. Pt states her previous calcium with vit D was also calcium carbonate and she had no issues, stating she took it for years prior to surgery. Pt states she can't eat raw lettuce or spinach, stating she gets so full.  Estimated daily fluid intake: 64+ oz Estimated daily protein intake: 60-70 g Supplements: multivitamin and calcium, biotin Current average weekly physical activity: ADLs.  24-Hr Dietary Recall: wakes  around 9:30 First Meal 10:30am: water , coffee, pre-made protein shake (pt states she is not a breakfast person) Snack: cheese stick  Second Meal: keto turkey sandwich + quest chips or salad or salmon and broccoli Snack: quest protein chips, fruit Third Meal: chicken or salmon and broccoli/corn or tuna sandwich or egg salad sandwich Snack: sf pop cycle or coffee with protein shake Beverages: water , coffee, vitamin water  zero, water  + flavoring (crystal light)  Post-Op Goals/ Signs/ Symptoms Using straws: no Drinking while eating: no Chewing/swallowing difficulties: no Changes in vision: no Changes to mood/headaches: no Hair loss/changes to skin/nails: yes, hair loss Difficulty focusing/concentrating: no Sweating: no Limb weakness: no Dizziness/lightheadedness: no Palpitations: no  Carbonated/caffeinated beverages: no N/V/D/C/Gas: no Abdominal pain: no Dumping syndrome: no   NUTRITION DIAGNOSIS  Overweight/obesity ([place]-3.3) related to past poor dietary habits and physical inactivity as evidenced by completed bariatric surgery and following dietary guidelines for continued weight loss and healthy nutrition status. Continued   NUTRITION INTERVENTION Nutrition counseling (C-1) and education (E-2) to facilitate bariatric surgery goals, including: The importance of consuming adequate calories as well as certain nutrients daily due to the body's need for essential vitamins, minerals, and fats The importance of daily physical activity and to reach a goal  of at least 150 minutes of moderate to vigorous physical activity weekly (or as directed by their physician) due to benefits such as increased musculature and improved lab values The importance of intuitive eating specifically learning hunger-satiety cues and understanding the importance of learning a new body: The importance of mindful eating to avoid grazing behaviors  Encouraged patient to honor their body's internal hunger and fullness  cues.  Throughout the day, check in mentally and rate hunger. Stop eating when satisfied not full regardless of how much food is left on the plate.  Get more if still hungry 20-30 minutes later.  The key is to honor satisfaction so throughout the meal, rate fullness factor and stop when comfortably satisfied not physically full. The key is to honor hunger and fullness without any feelings of guilt or shame.  Pay attention to what the internal cues are, rather than any external factors. This will enhance the confidence you have in listening to your own body and following those internal cues enabling you to increase how often you eat when you are hungry not out of appetite and stop when you are satisfied not full.  Encouraged pt to continue to eat balanced meals inclusive of non starchy vegetables 2 times a day 7 days a week Encouraged pt to choose lean protein sources: limiting beef, pork, sausage, hotdogs, and lunch meat Encourage pt to choose healthy fats such as plant based limiting animal fats Encouraged pt to continue to drink a minium 64 fluid ounces with half being plain water  to satisfy proper hydration   Why you need complex carbohydrates: Whole grains and other complex carbohydrates are required to have a healthy diet. Whole grains provide fiber which can help with blood glucose levels and help keep you satiated. Fruits and starchy vegetables provide essential vitamins and minerals required for immune function, eyesight support, brain support, bone density, wound healing and many other functions within the body. According to the current evidenced based 2020-2025 Dietary Guidelines for Americans, complex carbohydrates are part of a healthy eating pattern which is associated with a decreased risk for type 2 diabetes, cancers, and cardiovascular disease.   Goals - Continue: take multivitamin (with iron ) and calcium - Continue: aim for at least 2 servings of non-starchy vegetables a day. - Re-engage:  times 2: increase physical activity; Check out BELT; add in resistance 2 times a week at least  - New: aim for 80 grams of protein per day - New: try Tums or calcium citrate without Vit D - New: eat cooked vegetables (spinach) for better tolerance  Handouts Previously Provided Include  Goals printed  Learning Style & Readiness for Change Teaching method utilized: Visual & Auditory  Demonstrated degree of understanding via: Teach Back  Readiness Level: Preparation Barriers to learning/adherence to lifestyle change: nothing identified  RD's Notes for Next Visit Assess adherence to pt chosen goals  MONITORING & EVALUATION Dietary intake, weekly physical activity, body weight.  Next Steps Patient to return in 3 months for follow-up.

## 2023-12-19 ENCOUNTER — Inpatient Hospital Stay: Attending: Hematology

## 2023-12-19 ENCOUNTER — Inpatient Hospital Stay (HOSPITAL_BASED_OUTPATIENT_CLINIC_OR_DEPARTMENT_OTHER): Admitting: Hematology

## 2023-12-19 ENCOUNTER — Encounter: Payer: Self-pay | Admitting: General Surgery

## 2023-12-19 VITALS — BP 109/69 | HR 56 | Temp 97.3°F | Resp 20 | Wt 182.8 lb

## 2023-12-19 DIAGNOSIS — D509 Iron deficiency anemia, unspecified: Secondary | ICD-10-CM | POA: Insufficient documentation

## 2023-12-19 DIAGNOSIS — D500 Iron deficiency anemia secondary to blood loss (chronic): Secondary | ICD-10-CM

## 2023-12-19 DIAGNOSIS — D563 Thalassemia minor: Secondary | ICD-10-CM | POA: Insufficient documentation

## 2023-12-19 LAB — CMP (CANCER CENTER ONLY)
ALT: 37 U/L (ref 0–44)
AST: 28 U/L (ref 15–41)
Albumin: 4.2 g/dL (ref 3.5–5.0)
Alkaline Phosphatase: 42 U/L (ref 38–126)
Anion gap: 5 (ref 5–15)
BUN: 12 mg/dL (ref 6–20)
CO2: 29 mmol/L (ref 22–32)
Calcium: 9 mg/dL (ref 8.9–10.3)
Chloride: 103 mmol/L (ref 98–111)
Creatinine: 0.64 mg/dL (ref 0.44–1.00)
GFR, Estimated: >60 mL/min (ref >60)
Glucose, Bld: 86 mg/dL (ref 70–99)
Potassium: 4.2 mmol/L (ref 3.5–5.1)
Sodium: 137 mmol/L (ref 135–145)
Total Bilirubin: 0.4 mg/dL (ref 0.0–1.2)
Total Protein: 6.9 g/dL (ref 6.5–8.1)

## 2023-12-19 LAB — CBC WITH DIFFERENTIAL (CANCER CENTER ONLY)
Abs Immature Granulocytes: 0.01 K/uL (ref 0.00–0.07)
Basophils Absolute: 0 K/uL (ref 0.0–0.1)
Basophils Relative: 1 %
Eosinophils Absolute: 0 K/uL (ref 0.0–0.5)
Eosinophils Relative: 1 %
HCT: 32.3 % — ABNORMAL LOW (ref 36.0–46.0)
Hemoglobin: 10.7 g/dL — ABNORMAL LOW (ref 12.0–15.0)
Immature Granulocytes: 0 %
Lymphocytes Relative: 46 %
Lymphs Abs: 1.9 K/uL (ref 0.7–4.0)
MCH: 26.6 pg (ref 26.0–34.0)
MCHC: 33.1 g/dL (ref 30.0–36.0)
MCV: 80.3 fL (ref 80.0–100.0)
Monocytes Absolute: 0.4 K/uL (ref 0.1–1.0)
Monocytes Relative: 9 %
Neutro Abs: 1.7 K/uL (ref 1.7–7.7)
Neutrophils Relative %: 43 %
Platelet Count: 246 K/uL (ref 150–400)
RBC: 4.02 MIL/uL (ref 3.87–5.11)
RDW: 13.2 % (ref 11.5–15.5)
WBC Count: 4.1 K/uL (ref 4.0–10.5)
nRBC: 0 % (ref 0.0–0.2)

## 2023-12-19 LAB — FERRITIN: Ferritin: 138 ng/mL (ref 11–307)

## 2023-12-19 LAB — IRON AND IRON BINDING CAPACITY (CC-WL,HP ONLY)
Iron: 88 ug/dL (ref 28–170)
Saturation Ratios: 27 % (ref 10.4–31.8)
TIBC: 328 ug/dL (ref 250–450)
UIBC: 240 ug/dL (ref 148–442)

## 2023-12-19 LAB — VITAMIN B12: Vitamin B-12: 1668 pg/mL — ABNORMAL HIGH (ref 180–914)

## 2023-12-23 DIAGNOSIS — F330 Major depressive disorder, recurrent, mild: Secondary | ICD-10-CM | POA: Diagnosis not present

## 2023-12-23 DIAGNOSIS — F411 Generalized anxiety disorder: Secondary | ICD-10-CM | POA: Diagnosis not present

## 2023-12-26 ENCOUNTER — Encounter: Payer: Self-pay | Admitting: Hematology

## 2023-12-26 DIAGNOSIS — R062 Wheezing: Secondary | ICD-10-CM | POA: Diagnosis not present

## 2023-12-26 NOTE — Progress Notes (Signed)
 HEMATOLOGY/ONCOLOGY CLINIC NOTE  Date of Service: .12/19/2023  Patient Care Team: Georgina Speaks, FNP as PCP - General (General Practice) Raford Riggs, MD as PCP - Cardiology (Cardiology) Skeet Juliene SAUNDERS, DO as Consulting Physician (Neurology)  CHIEF COMPLAINTS/PURPOSE OF CONSULTATION:  Follow-up for continued evaluation and management of iron  deficiency anemia here for continued  HISTORY OF PRESENTING ILLNESS:   [name] is a wonderful 48 y.o. female who has been referred to us  by Dr. Leigh for evaluation and management of iron  deficiency anemia. The pt reports that she is doing well overall.   The pt reports that she has been anemic for as long as she can remember. Dr. Leigh completed an Upper Endoscopy earlier this year to work up the cause for her anemia. No cause has been found yet. Pt also had a Colonoscopy in 2018 and a Capsule Endoscopy in 2019. She denies significant hemorrhoidal bleeding. Pt stopped having menstrual cycles in 2005 after having a partial hysterectomy for Endometriosis. She does not take much OTC pain medication as it upsets her stomach. Pt is currently taking high-dose Vitamin D  twice per week and Calcium daily. She does not have thorough knowledge of her paternal family history, but is not aware of any family history of blood disorders or cancers.   Pt is having pain around her ear, right-sided jaw pain, dry mouth, dry eyes & burning sensation mainly in her right eye. She has tried eye lubricants and steroidal eye drops to no avail. Pt has had these symptoms evaluated by ENT & Optometrist, who found no cause. These symptoms have been intermittent for the last month. Pt also reports recent increase in urinary urgency, SOB, and new heart palpitations. She has had difficulty swallowing since 2018, which has not improved since dilation was performed during the latest Endoscopy. Pt was recently on Reclast but has been off for a few months. Pt has been  using OTC Ferrous Sulfate  and has experienced constipation.    Most recent lab results (02/16/2020) of CBC is as follows: all values are WNL except for Hgb at 10.7, HCT at 33.5, MCV at 75.6, RDW at 15.7. 02/16/2020 IBC + Ferritin is as follows: Iron  at 58, TIBC at 257.0, Sat Ratios at 16.1, Ferritin at 53.5.  On review of systems, pt reports tingling/numbness in hands/feet, discomfort in mastoid area, jaw pain, dry mouth, dry eyes, ankle pain, urinary urgency, SOB, palpitations, constipation and denies joint swelling, rash, mouth sores, abdominal pain, abnormal/excessive bleeding and any other symptoms.   On PMHx the pt reports Endometriosis, Vitamin D  deficiency, Osteoporosis, Inappropriate sinus tachycardia, Back pain, Anemia, Partial hysterectomy.  Interval History  [name] is a 48 y.o. female is here for continued evaluation management of iron  deficiency anemia . Patient notes her weight is about the same since her last clinic visit. No abnormal bleeding or bruising. No evidence of overt GI bleeding.  No black stools or blood in the stools.  MEDICAL HISTORY: Past Medical History:  Diagnosis Date   Allergy    Anemia    Anxiety    Apnea 12/08/2019   Arthritis    Asthma    Back pain    Constipation    Exertional dyspnea 03/14/2020   Fatty liver    GERD (gastroesophageal reflux disease)    H/O: hysterectomy    Hx of endometriosis    Inappropriate sinus tachycardia (HCC) 12/08/2019   Insomnia    Joint pain    Lactose intolerance  Migraine    Morbid obesity (HCC) 12/08/2019   Osteoporosis    Pneumonia    Pre-diabetes    Urticaria    Vitamin D  deficiency     SURGICAL HISTORY: Past Surgical History:  Procedure Laterality Date   10 HOUR PH STUDY N/A 01/12/2018   Procedure: 24 HOUR PH STUDY;  Surgeon: Shila Gustav GAILS, MD;  Location: [facility];  Service: Endoscopy;  Laterality: N/A;   ABDOMINAL HYSTERECTOMY  12/12/2003   ADENOIDECTOMY  05/2011   ANKLE  ARTHROSCOPY  12/29/2007   right; with extensive debridement   BLADDER NECK RECONSTRUCTION  01/14/2011   Procedure: BLADDER NECK REPAIR;  Surgeon: Glenys GORMAN Birk, MD;  Location: [facility];  Service: Gynecology;  Laterality: N/A;  Laparoscopic Repair of Incidental Cystotomy   BREAST REDUCTION SURGERY  08/05/2011   Procedure: MAMMARY REDUCTION  (BREAST);  Surgeon: Elna Pick, MD;  Location: [facility];  Service: Plastics;  Laterality: Bilateral;  bilateral   CESAREAN SECTION  12/29/2000; 1994   CHOLECYSTECTOMY     age 21   DILATION AND CURETTAGE OF UTERUS  07/08/2003   open laparoscopy   ESOPHAGEAL MANOMETRY N/A 01/12/2018   Procedure: ESOPHAGEAL MANOMETRY (EM);  Surgeon: Shila Gustav GAILS, MD;  Location: [facility];  Service: Endoscopy;  Laterality: N/A;   GASTRIC ROUX-EN-Y N/A 03/19/2022   Procedure: LAPAROSCOPIC ROUX-EN-Y GASTRIC BYPASS WITH UPPER ENDOSCOPY;  Surgeon: Tanda Locus, MD;  Location: [facility];  Service: General;  Laterality: N/A;   GIVENS CAPSULE STUDY N/A 06/23/2017   Procedure: GIVENS CAPSULE STUDY;  Surgeon: Leigh Elspeth SQUIBB, MD;  Location: [facility];  Service: Gastroenterology;  Laterality: N/A;   HIATAL HERNIA REPAIR N/A 03/19/2022   Procedure: HERNIA REPAIR HIATAL;  Surgeon: Tanda Locus, MD;  Location: [facility];  Service: General;  Laterality: N/A;   LAPAROSCOPIC SALPINGOOPHERECTOMY  01/14/2011   left   LAPAROSCOPY N/A 11/01/2022   Procedure: LAPAROSCOPY DIAGNOSTIC; LAPARSCOPIC CLOSURE OF PETERSEN SPACE;  Surgeon: Tanda Locus, MD;  Location: [facility];  Service: General;  Laterality: N/A;  DR ELETHA TO ASSIST   PH IMPEDANCE STUDY N/A 01/12/2018   Procedure: PH IMPEDANCE STUDY;  Surgeon: Shila Gustav GAILS, MD;  Location: [facility];  Service: Endoscopy;  Laterality: N/A;   RESECTION DISTAL CLAVICAL Right 09/25/2017   Procedure: RESECTION DISTAL CLAVICAL;  Surgeon: Cristy Bonner DASEN, MD;  Location: [facility];  Service: Orthopedics;   Laterality: Right;   RIGHT OOPHORECTOMY  2011   with lysis of adhesions   SHOULDER ACROMIOPLASTY Right 09/25/2017   Procedure: SHOULDER ACROMIOPLASTY;  Surgeon: Cristy Bonner DASEN, MD;  Location: [facility];  Service: Orthopedics;  Laterality: Right;   SHOULDER ARTHROSCOPY WITH DEBRIDEMENT AND BICEP TENDON REPAIR Right 09/25/2017   Procedure: SHOULDER ARTHROSCOPY WITH DEBRIDEMENT AND BICEP TENDON REPAIR;  Surgeon: Cristy Bonner DASEN, MD;  Location: [facility];  Service: Orthopedics;  Laterality: Right;   SHOULDER ARTHROSCOPY WITH ROTATOR CUFF REPAIR Right 09/25/2017   Procedure: SHOULDER ARTHROSCOPY WITH ROTATOR CUFF REPAIR;  Surgeon: Cristy Bonner DASEN, MD;  Location: [facility];  Service: Orthopedics;  Laterality: Right;   TONSILLECTOMY  as a child   TUBAL LIGATION  12/29/2000   UPPER GASTROINTESTINAL ENDOSCOPY     UPPER GI ENDOSCOPY N/A 11/01/2022   Procedure: UPPER GI ENDOSCOPY;  Surgeon: Tanda Locus, MD;  Location: [facility];  Service: General;  Laterality: N/A;    SOCIAL HISTORY: Social History   Socioeconomic History   Marital status: Divorced  Spouse name: Not on file   Number of children: 2   Years of education: 12th   Highest education level: Not on file  Occupational History   Occupation: Facilities manager: OTHER    Comment: [employer]  Tobacco Use   Smoking status: Never   Smokeless tobacco: Never  Vaping Use   Vaping status: Never Used  Substance and Sexual Activity   Alcohol use: Not Currently    Comment: occasional    Drug use: No   Sexual activity: Not Currently    Birth control/protection: Surgical  Other Topics Concern   Not on file  Social History Narrative   Pt lives in 2 story town home with her son   Has 2 children   High school Paramedic for [employer]   Right handed   Social Drivers of Health   Financial Resource Strain: Patient Declined (07/22/2023)   Received from [facility]   Overall  Financial Resource Strain (CARDIA)    Difficulty of Paying Living Expenses: Patient declined  Food Insecurity: Patient Declined (07/22/2023)   Received from [facility]   Hunger Vital Sign    Within the past 12 months, you worried that your food would run out before you got the money to buy more.: Patient declined    Within the past 12 months, the food you bought just didn't last and you didn't have money to get more.: Patient declined  Transportation Needs: No Transportation Needs (07/22/2023)   Received from [facility] - Transportation    Lack of Transportation (Medical): No    Lack of Transportation (Non-Medical): No  Physical Activity: Insufficiently Active (07/22/2023)   Received from [facility]   Exercise Vital Sign    On average, how many days per week do you engage in moderate to strenuous exercise (like a brisk walk)?: 3 days    On average, how many minutes do you engage in exercise at this level?: 30 min  Stress: No Stress Concern Present (07/22/2023)   Received from [facility] - Occupational Stress Questionnaire    Feeling of Stress : Not at all  Social Connections: Patient Declined (07/22/2023)   Received [facility]   Social Network    How would you rate your social network (family, work, friends)?: Patient declined  Intimate Partner Violence: Not At Risk (07/22/2023)   Received from [facility]   HITS    Over the last 12 months how often did your partner physically hurt you?: Never    Over the last 12 months how often did your partner insult you or talk down to you?: Never    Over the last 12 months how often did your partner threaten you with physical harm?: Never    Over the last 12 months how often did your partner scream or curse at you?: Never    FAMILY HISTORY: Family History  Problem Relation Age of Onset   Diabetes Mother    Hypertension Mother    Hyperlipidemia Mother    Sleep apnea  Mother    Thyroid  disease Sister    Dementia Father    Heart attack Maternal Grandmother    Stroke Maternal Grandfather    Asthma Son    Healthy Son    Asthma Daughter    Healthy Daughter    Colon cancer Neg Hx    Colon polyps Neg Hx    Esophageal cancer  Neg Hx    Rectal cancer Neg Hx    Stomach cancer Neg Hx     ALLERGIES:  is allergic to ibuprofen , aspirin , bactrim  [sulfamethoxazole -trimethoprim ], doxycycline , salmon [fish allergy], and zoledronic acid.  MEDICATIONS:  Current Outpatient Medications  Medication Sig Dispense Refill   acetaminophen  (TYLENOL ) 325 MG tablet Take 2 tablets (650 mg total) by mouth every 6 (six) hours as needed. 30 tablet 0   albuterol  (VENTOLIN  HFA) 108 (90 Base) MCG/ACT inhaler Inhale 1-2 puffs into the lungs every 6 (six) hours as needed for wheezing or shortness of breath. 1 each 0   benzonatate  (TESSALON  PERLES) 100 MG capsule Take 1 capsule (100 mg total) by mouth every 6 (six) hours as needed. 30 capsule 1   calcium carbonate (OS-CAL - DOSED IN MG OF ELEMENTAL CALCIUM) 1250 (500 Ca) MG tablet Take 1 tablet by mouth daily with breakfast.     clonazePAM  (KLONOPIN ) 1 MG tablet Take 0.5-1 mg by mouth at bedtime.     diclofenac  (VOLTAREN ) 75 MG EC tablet Take 75 mg by mouth 2 (two) times daily. (Patient not taking: Reported on 05/15/2023)     EPINEPHrine  (EPIPEN  2-PAK) 0.3 mg/0.3 mL IJ SOAJ injection Inject 0.3 mg into the muscle as needed for anaphylaxis. 2 each 2   ergocalciferol  (VITAMIN D2) 1.25 MG (50000 UT) capsule Take 50,000 Units by mouth once a week.     esomeprazole  (NEXIUM ) 40 MG capsule Take 1 capsule (40 mg total) by mouth daily. 90 capsule 3   estradiol (CLIMARA - DOSED IN MG/24 HR) 0.1 mg/24hr patch Place 0.1 mg onto the skin once a week.     HYDROcodone -acetaminophen  (NORCO) 7.5-325 MG tablet Take 1 tablet by mouth every 8 (eight) hours as needed for moderate pain. (Patient not taking: Reported on 01/21/2023)     Multiple Vitamins-Minerals  (BARIATRIC MULTIVITAMINS/IRON  PO) Take 1 capsule by mouth in the morning and at bedtime.     ondansetron  (ZOFRAN -ODT) 4 MG disintegrating tablet TAKE 1 TABLET BY MOUTH EVERY 8 HOURS AS NEEDED FOR NAUSEA FOR UP TO 7 DAYS     RESTASIS 0.05 % ophthalmic emulsion Place 1 drop into both eyes 2 (two) times daily as needed (dryness).     No current facility-administered medications for this visit.    REVIEW OF SYSTEMS:   .10 Point review of Systems was done is negative except as noted above.  PHYSICAL EXAMINATION:   .BP 109/69   Pulse (!) 56   Temp (!) 97.3 F (36.3 C)   Resp 20   Wt 182 lb 12.8 oz (82.9 kg)   LMP 12/27/2003   SpO2 98%   BMI 31.38 kg/m  . GENERAL:alert, in no acute distress and comfortable SKIN: no acute rashes, no significant lesions EYES: conjunctiva are pink and non-injected, sclera anicteric OROPHARYNX: MMM, no exudates, no oropharyngeal erythema or ulceration NECK: supple, no JVD LYMPH:  no palpable lymphadenopathy in the cervical, axillary or inguinal regions LUNGS: clear to auscultation b/l with normal respiratory effort HEART: regular rate & rhythm ABDOMEN:  normoactive bowel sounds , non tender, not distended.,  No palpable hepatosplenomegaly Extremity: no pedal edema PSYCH: alert & oriented x 3 with fluent speech NEURO: no focal motor/sensory deficits   LABORATORY DATA:  I have reviewed the data as listed  .    Latest Ref Rng & Units 12/19/2023    1:54 PM 07/02/2023    2:49 PM 05/21/2023    3:07 AM  CBC  WBC 4.0 - 10.5 K/uL 4.1  5.8  4.0   Hemoglobin 12.0 - 15.0 g/dL 89.2  88.6  89.3   Hematocrit 36.0 - 46.0 % 32.3  35.2  33.4   Platelets 150 - 400 K/uL 246  263  259    . CBC    Component Value Date/Time   WBC 4.1 12/19/2023 1354   WBC 4.0 05/21/2023 0307   RBC 4.02 12/19/2023 1354   HGB 10.7 (L) 12/19/2023 1354   HGB 11.8 05/15/2023 1510   HCT 32.3 (L) 12/19/2023 1354   HCT 36.9 05/15/2023 1510   PLT 246 12/19/2023 1354   PLT 230  05/15/2023 1510   MCV 80.3 12/19/2023 1354   MCV 80 05/15/2023 1510   MCH 26.6 12/19/2023 1354   MCHC 33.1 12/19/2023 1354   RDW 13.2 12/19/2023 1354   RDW 12.0 05/15/2023 1510   LYMPHSABS 1.9 12/19/2023 1354   LYMPHSABS 1.0 05/15/2023 1510   MONOABS 0.4 12/19/2023 1354   EOSABS 0.0 12/19/2023 1354   EOSABS 0.0 05/15/2023 1510   BASOSABS 0.0 12/19/2023 1354   BASOSABS 0.0 05/15/2023 1510    .    Latest Ref Rng & Units 12/19/2023    1:54 PM 07/02/2023    2:49 PM 05/21/2023    3:07 AM  CMP  Glucose 70 - 99 mg/dL 86  78  99   BUN 6 - 20 mg/dL 12  15  9    Creatinine 0.44 - 1.00 mg/dL 9.35  9.36  9.44   Sodium 135 - 145 mmol/L 137  136  138   Potassium 3.5 - 5.1 mmol/L 4.2  4.0  3.2   Chloride 98 - 111 mmol/L 103  102  104   CO2 22 - 32 mmol/L 29  29  25    Calcium 8.9 - 10.3 mg/dL 9.0  9.4  9.0   Total Protein 6.5 - 8.1 g/dL 6.9  7.4  7.3   Total Bilirubin 0.0 - 1.2 mg/dL 0.4  0.4  0.4   Alkaline Phos 38 - 126 U/L 42  52  45   AST 15 - 41 U/L 28  29  25    ALT 0 - 44 U/L 37  33  27    . Lab Results  Component Value Date   IRON  88 12/19/2023   TIBC 328 12/19/2023   IRONPCTSAT 27 12/19/2023   (Iron  and TIBC)  Lab Results  Component Value Date   FERRITIN 138 12/19/2023   Alpha-Thalassemia GenotypR Order: [id number] Status: Final result   Visible to patient: Yes (seen)   Next appt: 06/19/2020 at 10:40 AM in [facility] Versa Opalski, DO)   Dx: Microcytic anemia   0 Result Notes  Component 3 mo ago  Alpha-Thalassemia Comment:   Comment: (NOTE)  Test: Alpha-Thalassemia, DNA Analysis  Result:      Alpha-+-thalassemia trait, alpha alpha/alpha-  Mutation(s) identified: alpha3.7  Interpretation:  This result is most consistent with this individual being an  unaffected carrier of alpha-thalassemia with a single gene  deletion, also called alpha-+-thalassemia trait.       RADIOGRAPHIC STUDIES: I have personally reviewed the radiological images as listed and  agreed with the findings in the report. CT ABDOMEN PELVIS W CONTRAST Result Date: 12/19/2023 CLINICAL DATA:  Bloating, history of gastric bypass EXAM: CT ABDOMEN AND PELVIS WITH CONTRAST TECHNIQUE: Multidetector CT imaging of the abdomen and pelvis was performed using the standard protocol following bolus administration of intravenous contrast. RADIATION DOSE REDUCTION: This exam was performed according to the departmental dose-optimization program  which includes automated exposure control, adjustment of the mA and/or kV according to patient size and/or use of iterative reconstruction technique. CONTRAST:  OMNIPAQUE  IOHEXOL  300 MG/ML SOLN oral enteric contrast COMPARISON:  06/04/2023 FINDINGS: Lower chest: No acute abnormality. Hepatobiliary: No focal liver abnormality is seen. Status post cholecystectomy. No biliary dilatation. Pancreas: Unremarkable. No pancreatic ductal dilatation or surrounding inflammatory changes. Spleen: Normal in size without significant abnormality. Adrenals/Urinary Tract: Adrenal glands are unremarkable. Kidneys are normal, without renal calculi, solid lesion, or hydronephrosis. Bladder is unremarkable. Stomach/Bowel: Roux gastric bypass. Appendix appears normal. No evidence of bowel wall thickening, distention, or inflammatory changes. Vascular/Lymphatic: No significant vascular findings are present. No enlarged abdominal or pelvic lymph nodes. Reproductive: Hysterectomy. Other: No abdominal wall hernia or abnormality. No ascites. Musculoskeletal: No acute or significant osseous findings. IMPRESSION: 1. No acute CT findings of the abdomen or pelvis to explain bloating. 2. Roux gastric bypass. No evidence of bowel obstruction or inflammation. 3. Status post cholecystectomy and hysterectomy. Electronically Signed   By: Marolyn JONETTA Jaksch M.D.   On: 12/19/2023 09:20    ASSESSMENT & PLAN:   48 yo female with   1) Microcytic Anemia due to iron  deficiency 2) Alpha thalassemia  trait PLAN:  - Discussed labs from today with the patient in details  - CBC shows a hemoglobin of 10.7 which is very close to her baseline hemoglobin levels of about 11-11.5 from her alpha thalassemia minor. Normal WBC count and platelets Ferritin levels are within normal limits at 138 with normal iron  saturation No overt indication for additional IV iron  at this time. -continue vitamin D  and vitamin B complex  FOLLOW-UP:  RTC with Dr Nichola Cieslinski with labs in 12 months   The total time spent in the appointment was 20 minutes*.  All of the patient's questions were answered with apparent satisfaction. The patient knows to call the clinic with any problems, questions or concerns.   Emaline Saran MD MS AAHIVMS [facility] Hematology/Oncology Physician [facility]  .*Total Encounter Time as defined by the Centers for Medicare and Medicaid Services includes, in addition to the face-to-face time of a patient visit (documented in the note above) non-face-to-face time: obtaining and reviewing outside history, ordering and reviewing medications, tests or procedures, care coordination (communications with other health care professionals or caregivers) and documentation in the medical record.

## 2024-01-08 DIAGNOSIS — F330 Major depressive disorder, recurrent, mild: Secondary | ICD-10-CM | POA: Diagnosis not present

## 2024-01-08 DIAGNOSIS — F411 Generalized anxiety disorder: Secondary | ICD-10-CM | POA: Diagnosis not present

## 2024-01-12 DIAGNOSIS — A6000 Herpesviral infection of urogenital system, unspecified: Secondary | ICD-10-CM | POA: Diagnosis not present

## 2024-01-12 DIAGNOSIS — E2839 Other primary ovarian failure: Secondary | ICD-10-CM | POA: Diagnosis not present

## 2024-01-12 DIAGNOSIS — Z6830 Body mass index (BMI) 30.0-30.9, adult: Secondary | ICD-10-CM | POA: Diagnosis not present

## 2024-01-12 DIAGNOSIS — M810 Age-related osteoporosis without current pathological fracture: Secondary | ICD-10-CM | POA: Diagnosis not present

## 2024-01-12 DIAGNOSIS — D649 Anemia, unspecified: Secondary | ICD-10-CM | POA: Diagnosis not present

## 2024-01-12 DIAGNOSIS — Z1231 Encounter for screening mammogram for malignant neoplasm of breast: Secondary | ICD-10-CM | POA: Diagnosis not present

## 2024-01-12 DIAGNOSIS — Z0000 Encounter for general adult medical examination without abnormal findings: Secondary | ICD-10-CM | POA: Diagnosis not present

## 2024-01-12 DIAGNOSIS — Z309 Encounter for contraceptive management, unspecified: Secondary | ICD-10-CM | POA: Diagnosis not present

## 2024-01-12 DIAGNOSIS — E669 Obesity, unspecified: Secondary | ICD-10-CM | POA: Diagnosis not present

## 2024-01-12 LAB — HM MAMMOGRAPHY

## 2024-01-13 ENCOUNTER — Encounter: Payer: Self-pay | Admitting: Hematology

## 2024-01-19 DIAGNOSIS — F411 Generalized anxiety disorder: Secondary | ICD-10-CM | POA: Diagnosis not present

## 2024-01-19 DIAGNOSIS — F330 Major depressive disorder, recurrent, mild: Secondary | ICD-10-CM | POA: Diagnosis not present

## 2024-01-22 ENCOUNTER — Encounter: Payer: Self-pay | Admitting: Plastic Surgery

## 2024-01-22 ENCOUNTER — Ambulatory Visit: Admitting: Plastic Surgery

## 2024-01-22 VITALS — BP 106/66 | HR 61 | Ht 64.0 in | Wt 185.0 lb

## 2024-01-22 DIAGNOSIS — R21 Rash and other nonspecific skin eruption: Secondary | ICD-10-CM | POA: Diagnosis not present

## 2024-01-22 DIAGNOSIS — M793 Panniculitis, unspecified: Secondary | ICD-10-CM

## 2024-01-22 DIAGNOSIS — Z9884 Bariatric surgery status: Secondary | ICD-10-CM | POA: Diagnosis not present

## 2024-01-22 DIAGNOSIS — L987 Excessive and redundant skin and subcutaneous tissue: Secondary | ICD-10-CM

## 2024-01-22 NOTE — Progress Notes (Signed)
 Referring Provider Georgina Speaks, FNP [address] [place],  [place] [address]   CC:  Chief Complaint  Patient presents with   Consult           [name] is an 48 y.o. female.  HPI: [name] is a 48 year old female who underwent gastric bypass several years ago and has had approximately a 65 pound weight loss.  She has also had 2 C-sections and hysterectomy and has a scar across the lower portion of her abdomen.  She now has excess skin and fat which hangs over the scar and is often wet and has ongoing rashes.  The excess skin and fat also interfere with her ability to wear clothes appropriately.  The pannus interferes with her daily activities including being able to easily clean herself after toileting.  She would like to have this pannus of skin removed.  She denies any significant medical problems including diabetes and hypertension.  She has no history of DVT.  She underwent laparoscopy for an internal hernia in July 2024, no abdominal wall hernias were noted at that time.  Allergies  Allergen Reactions   Ibuprofen  Other (See Comments)    MD advised pt to not take med- Gastric Bypass!!    Aspirin  Other (See Comments)    MD advised pt to not take med   Bactrim  [Sulfamethoxazole -Trimethoprim ]    Doxycycline     Salmon [Fish Allergy] Swelling   Zoledronic Acid Other (See Comments)    Muscle aches    Outpatient Encounter Medications as of 01/22/2024  Medication Sig   acetaminophen  (TYLENOL ) 325 MG tablet Take 2 tablets (650 mg total) by mouth every 6 (six) hours as needed.   albuterol  (VENTOLIN  HFA) 108 (90 Base) MCG/ACT inhaler Inhale 1-2 puffs into the lungs every 6 (six) hours as needed for wheezing or shortness of breath.   benzonatate  (TESSALON  PERLES) 100 MG capsule Take 1 capsule (100 mg total) by mouth every 6 (six) hours as needed.   calcium carbonate (OS-CAL - DOSED IN MG OF ELEMENTAL CALCIUM) 1250 (500 Ca) MG tablet Take 1 tablet by mouth daily  with breakfast.   clonazePAM  (KLONOPIN ) 1 MG tablet Take 0.5-1 mg by mouth at bedtime.   diclofenac  (VOLTAREN ) 75 MG EC tablet Take 75 mg by mouth 2 (two) times daily.   EPINEPHrine  (EPIPEN  2-PAK) 0.3 mg/0.3 mL IJ SOAJ injection Inject 0.3 mg into the muscle as needed for anaphylaxis.   ergocalciferol  (VITAMIN D2) 1.25 MG (50000 UT) capsule Take 50,000 Units by mouth once a week.   esomeprazole  (NEXIUM ) 40 MG capsule Take 1 capsule (40 mg total) by mouth daily.   estradiol (CLIMARA - DOSED IN MG/24 HR) 0.1 mg/24hr patch Place 0.1 mg onto the skin once a week.   HYDROcodone -acetaminophen  (NORCO) 7.5-325 MG tablet Take 1 tablet by mouth every 8 (eight) hours as needed for moderate pain.   Multiple Vitamins-Minerals (BARIATRIC MULTIVITAMINS/IRON  PO) Take 1 capsule by mouth in the morning and at bedtime.   ondansetron  (ZOFRAN -ODT) 4 MG disintegrating tablet TAKE 1 TABLET BY MOUTH EVERY 8 HOURS AS NEEDED FOR NAUSEA FOR UP TO 7 DAYS   RESTASIS 0.05 % ophthalmic emulsion Place 1 drop into both eyes 2 (two) times daily as needed (dryness).   No facility-administered encounter medications on file as of 01/22/2024.     Past Medical History:  Diagnosis Date   Allergy    Anemia    Anxiety    Apnea 12/08/2019   Arthritis  Asthma    Back pain    Constipation    Exertional dyspnea 03/14/2020   Fatty liver    GERD (gastroesophageal reflux disease)    H/O: hysterectomy    Hx of endometriosis    Inappropriate sinus tachycardia (HCC) 12/08/2019   Insomnia    Joint pain    Lactose intolerance    Migraine    Morbid obesity (HCC) 12/08/2019   Osteoporosis    Pneumonia    Pre-diabetes    Urticaria    Vitamin D  deficiency     Past Surgical History:  Procedure Laterality Date   67 HOUR PH STUDY N/A 01/12/2018   Procedure: 24 HOUR PH STUDY;  Surgeon: Shila Gustav GAILS, MD;  Location: [facility];  Service: Endoscopy;  Laterality: N/A;   ABDOMINAL HYSTERECTOMY  12/12/2003   ADENOIDECTOMY   05/2011   ANKLE ARTHROSCOPY  12/29/2007   right; with extensive debridement   BLADDER NECK RECONSTRUCTION  01/14/2011   Procedure: BLADDER NECK REPAIR;  Surgeon: Glenys GORMAN Birk, MD;  Location: [facility];  Service: Gynecology;  Laterality: N/A;  Laparoscopic Repair of Incidental Cystotomy   BREAST REDUCTION SURGERY  08/05/2011   Procedure: MAMMARY REDUCTION  (BREAST);  Surgeon: Elna Pick, MD;  Location: [facility];  Service: Plastics;  Laterality: Bilateral;  bilateral   CESAREAN SECTION  12/29/2000; 1994   CHOLECYSTECTOMY     age 35   DILATION AND CURETTAGE OF UTERUS  07/08/2003   open laparoscopy   ESOPHAGEAL MANOMETRY N/A 01/12/2018   Procedure: ESOPHAGEAL MANOMETRY (EM);  Surgeon: Shila Gustav GAILS, MD;  Location: [facility];  Service: Endoscopy;  Laterality: N/A;   GASTRIC ROUX-EN-Y N/A 03/19/2022   Procedure: LAPAROSCOPIC ROUX-EN-Y GASTRIC BYPASS WITH UPPER ENDOSCOPY;  Surgeon: Tanda Locus, MD;  Location: [facility];  Service: General;  Laterality: N/A;   GIVENS CAPSULE STUDY N/A 06/23/2017   Procedure: GIVENS CAPSULE STUDY;  Surgeon: Leigh Elspeth SQUIBB, MD;  Location: [facility];  Service: Gastroenterology;  Laterality: N/A;   HIATAL HERNIA REPAIR N/A 03/19/2022   Procedure: HERNIA REPAIR HIATAL;  Surgeon: Tanda Locus, MD;  Location: [facility];  Service: General;  Laterality: N/A;   LAPAROSCOPIC SALPINGOOPHERECTOMY  01/14/2011   left   LAPAROSCOPY N/A 11/01/2022   Procedure: LAPAROSCOPY DIAGNOSTIC; LAPARSCOPIC CLOSURE OF PETERSEN SPACE;  Surgeon: Tanda Locus, MD;  Location: [facility];  Service: General;  Laterality: N/A;  DR ELETHA TO ASSIST   PH IMPEDANCE STUDY N/A 01/12/2018   Procedure: PH IMPEDANCE STUDY;  Surgeon: Shila Gustav GAILS, MD;  Location: [facility];  Service: Endoscopy;  Laterality: N/A;   RESECTION DISTAL CLAVICAL Right 09/25/2017   Procedure: RESECTION DISTAL CLAVICAL;  Surgeon: Cristy Bonner DASEN, MD;  Location: [facility];  Service:  Orthopedics;  Laterality: Right;   RIGHT OOPHORECTOMY  2011   with lysis of adhesions   SHOULDER ACROMIOPLASTY Right 09/25/2017   Procedure: SHOULDER ACROMIOPLASTY;  Surgeon: Cristy Bonner DASEN, MD;  Location: [facility];  Service: Orthopedics;  Laterality: Right;   SHOULDER ARTHROSCOPY WITH DEBRIDEMENT AND BICEP TENDON REPAIR Right 09/25/2017   Procedure: SHOULDER ARTHROSCOPY WITH DEBRIDEMENT AND BICEP TENDON REPAIR;  Surgeon: Cristy Bonner DASEN, MD;  Location: [facility];  Service: Orthopedics;  Laterality: Right;   SHOULDER ARTHROSCOPY WITH ROTATOR CUFF REPAIR Right 09/25/2017   Procedure: SHOULDER ARTHROSCOPY WITH ROTATOR CUFF REPAIR;  Surgeon: Cristy Bonner DASEN, MD;  Location:  SURGERY CENTER;  Service: Orthopedics;  Laterality: Right;   TONSILLECTOMY  as a  child   TUBAL LIGATION  12/29/2000   UPPER GASTROINTESTINAL ENDOSCOPY     UPPER GI ENDOSCOPY N/A 11/01/2022   Procedure: UPPER GI ENDOSCOPY;  Surgeon: Tanda Locus, MD;  Location: [facility];  Service: General;  Laterality: N/A;    Family History  Problem Relation Age of Onset   Diabetes Mother    Hypertension Mother    Hyperlipidemia Mother    Sleep apnea Mother    Thyroid  disease Sister    Dementia Father    Heart attack Maternal Grandmother    Stroke Maternal Grandfather    Asthma Son    Healthy Son    Asthma Daughter    Healthy Daughter    Colon cancer Neg Hx    Colon polyps Neg Hx    Esophageal cancer Neg Hx    Rectal cancer Neg Hx    Stomach cancer Neg Hx     Social History   Social History Narrative   Pt lives in 2 story town home with her son   Has 2 children   High school Paramedic for [employer]   Right handed     Review of Systems General: Denies fevers, chills, weight loss CV: Denies chest pain, shortness of breath, palpitations Abdomen: Excess skin and fat on the anterior abdominal wall which is frequently infected with rashes and interferes with daily  activities and proper wearing of clothes  Physical Exam    01/22/2024    3:22 PM 12/19/2023    2:20 PM 07/02/2023    3:07 PM  Vitals with BMI  Height 5' 4    Weight 185 lbs 182 lbs 13 oz 181 lbs 2 oz  BMI 31.74    Systolic 106 109 891  Diastolic 66 69 81  Pulse 61 56 72    General:  No acute distress,  Alert and oriented, Non-Toxic, Normal speech and affect Abdomen: Patient has a moderately large pannus which extends to the symphysis pubis and covers the bilateral inguinal creases.  There is rash today but there is discoloration of the skin consistent with previous infections. Mammogram: No mammogram on file since 2019 Assessment/Plan Panniculitis: Patient has a moderately large pannus and would likely benefit from a panniculectomy.  Her weight is stable.  I discussed the procedure at length with the patient including showing her the location of the incisions and what would be resected.  We discussed wound healing and the unpredictable nature of scarring.  We discussed the risks of bleeding, infection, and seroma formation.  She understands that she will have a compressive garment she will need to wear for 6 weeks as well as drains which may be in place for 1 to 4 weeks.  Postoperative limitations include no heavy lifting greater than 20 pounds, no vigorous activity such as running dancing or jumping, and no submerging incisions in water  for 6 weeks.  She may return to light activity as tolerated though she may not drive for 24 hours after surgery and may not drive while taking narcotic pain medicine.  She will be encouraged to begin ambulation immediately after surgery to help decrease the risk of DVT.  All questions were answered to her satisfaction.  Photographs were obtained today with her consent.  Will submit her for a panniculectomy at her request.  A surgical clearance will be sent to her pain medicine doctor to ensure coordination for postoperative pain management.  [name] 01/22/2024, 4:38 PM

## 2024-01-26 ENCOUNTER — Telehealth: Payer: Self-pay

## 2024-01-26 NOTE — Telephone Encounter (Signed)
 Faxed the Req for Surgical Clearance form to patient's pain management provider with confirmed receipt.   Requesting that we give patient 5 days of post-op pain meds and for her to resume any pain management afterwards.

## 2024-01-29 ENCOUNTER — Encounter: Payer: Self-pay | Admitting: Nurse Practitioner

## 2024-01-29 ENCOUNTER — Ambulatory Visit: Admitting: Nurse Practitioner

## 2024-01-29 VITALS — BP 110/70 | HR 68 | Temp 98.2°F | Ht 64.0 in | Wt 186.2 lb

## 2024-01-29 DIAGNOSIS — D508 Other iron deficiency anemias: Secondary | ICD-10-CM | POA: Diagnosis not present

## 2024-01-29 DIAGNOSIS — Z636 Dependent relative needing care at home: Secondary | ICD-10-CM

## 2024-01-29 DIAGNOSIS — E782 Mixed hyperlipidemia: Secondary | ICD-10-CM | POA: Diagnosis not present

## 2024-01-29 DIAGNOSIS — Z2821 Immunization not carried out because of patient refusal: Secondary | ICD-10-CM

## 2024-01-29 DIAGNOSIS — Z139 Encounter for screening, unspecified: Secondary | ICD-10-CM

## 2024-01-29 DIAGNOSIS — R7309 Other abnormal glucose: Secondary | ICD-10-CM

## 2024-01-29 DIAGNOSIS — R5383 Other fatigue: Secondary | ICD-10-CM

## 2024-01-29 NOTE — Progress Notes (Signed)
 LILLETTE Kristeen JINNY Gladis, CMA,acting as a Neurosurgeon for Sheila Ada, FNP.,have documented all relevant documentation on the behalf of Sheila Ada, FNP,as directed by  Sheila Ada, FNP while in the presence of Sheila Ada, FNP.  Subjective:  Patient ID: [name] , female    DOB: 1975/10/22 , 48 y.o.   MRN: [id number]  Chief Complaint  Patient presents with   Hyperlipidemia    Patient presents today for a CHOL and PRE dm follow up, Patient reports compliance with medication. Patient denies any chest pain, SOB, or headaches. Patient has no concerns today.       HPI  Discussed the use of AI scribe software for clinical note transcription with the patient, who gave verbal consent to proceed.  History of Present Illness [name] is a 48 year old female who presents for follow-up on cholesterol and glucose levels.  She feels very tired, attributing it to her busy schedule and the demands of caring for her son, who has autism. Her hemoglobin was reportedly low at nine when checked last month.  She is working more and is involved in helping her son, Francis Carol, who was recently diagnosed with autism. She describes the challenges of managing his care, including his need for constant reminders and assistance with daily tasks. He works part-time but struggles with processing multiple instructions and has difficulty answering phone calls due to speech delays.  Her son had an Individualized Education Program (IEP) since he was in school and received early intervention services. Despite this, he continues to face challenges with independence and requires significant support from her. She is concerned about his ability to live independently and is seeking assistance from [employer] for disability benefits. She describes the stress of managing her responsibilities and the emotional toll it takes on her, often leading to feelings of being overwhelmed.  She is awaiting a decision regarding a  paniculotomy, which she has been considering for some time due to previous surgeries, including two C-sections and a hysterectomy.   Past Medical History:  Diagnosis Date   Allergy    Anemia    Anxiety    Apnea 12/08/2019   Arthritis    Asthma    Back pain    Constipation    Exertional dyspnea 03/14/2020   Fatty liver    GERD (gastroesophageal reflux disease)    H/O: hysterectomy    Hx of endometriosis    Inappropriate sinus tachycardia 12/08/2019   Insomnia    Joint pain    Lactose intolerance    Migraine    Morbid obesity (HCC) 12/08/2019   Osteoporosis    Pneumonia    Pre-diabetes    Urticaria    Vitamin D  deficiency      Family History  Problem Relation Age of Onset   Diabetes Mother    Hypertension Mother    Hyperlipidemia Mother    Sleep apnea Mother    Thyroid  disease Sister    Dementia Father    Heart attack Maternal Grandmother    Stroke Maternal Grandfather    Asthma Son    Asthma Daughter    Colon cancer Neg Hx    Colon polyps Neg Hx    Esophageal cancer Neg Hx    Rectal cancer Neg Hx    Stomach cancer Neg Hx      Current Outpatient Medications:    acetaminophen  (TYLENOL ) 325 MG tablet, Take 2 tablets (650 mg total) by mouth every 6 (six) hours as needed., Disp: 30  tablet, Rfl: 0   albuterol  (VENTOLIN  HFA) 108 (90 Base) MCG/ACT inhaler, Inhale 1-2 puffs into the lungs every 6 (six) hours as needed for wheezing or shortness of breath., Disp: 1 each, Rfl: 0   benzonatate  (TESSALON  PERLES) 100 MG capsule, Take 1 capsule (100 mg total) by mouth every 6 (six) hours as needed., Disp: 30 capsule, Rfl: 1   calcium carbonate (OS-CAL - DOSED IN MG OF ELEMENTAL CALCIUM) 1250 (500 Ca) MG tablet, Take 1 tablet by mouth daily with breakfast., Disp: , Rfl:    clonazePAM  (KLONOPIN ) 1 MG tablet, Take 0.5-1 mg by mouth at bedtime., Disp: , Rfl:    diclofenac  (VOLTAREN ) 75 MG EC tablet, Take 75 mg by mouth 2 (two) times daily., Disp: , Rfl:    EPINEPHrine  (EPIPEN   2-PAK) 0.3 mg/0.3 mL IJ SOAJ injection, Inject 0.3 mg into the muscle as needed for anaphylaxis., Disp: 2 each, Rfl: 2   ergocalciferol  (VITAMIN D2) 1.25 MG (50000 UT) capsule, Take 50,000 Units by mouth once a week., Disp: , Rfl:    esomeprazole  (NEXIUM ) 40 MG capsule, Take 1 capsule (40 mg total) by mouth daily., Disp: 90 capsule, Rfl: 3   estradiol (CLIMARA - DOSED IN MG/24 HR) 0.1 mg/24hr patch, Place 0.1 mg onto the skin once a week., Disp: , Rfl:    HYDROcodone -acetaminophen  (NORCO) 7.5-325 MG tablet, Take 1 tablet by mouth every 8 (eight) hours as needed for moderate pain., Disp: , Rfl:    Multiple Vitamins-Minerals (BARIATRIC MULTIVITAMINS/IRON  PO), Take 1 capsule by mouth in the morning and at bedtime., Disp: , Rfl:    ondansetron  (ZOFRAN -ODT) 4 MG disintegrating tablet, TAKE 1 TABLET BY MOUTH EVERY 8 HOURS AS NEEDED FOR NAUSEA FOR UP TO 7 DAYS, Disp: , Rfl:    RESTASIS 0.05 % ophthalmic emulsion, Place 1 drop into both eyes 2 (two) times daily as needed (dryness)., Disp: , Rfl:    Allergies  Allergen Reactions   Ibuprofen  Other (See Comments)    MD advised pt to not take med- Gastric Bypass!!    Aspirin  Other (See Comments)    MD advised pt to not take med   Bactrim  [Sulfamethoxazole -Trimethoprim ]    Doxycycline     Salmon [Fish Allergy] Swelling   Zoledronic Acid Other (See Comments)    Muscle aches     Review of Systems  Constitutional: Negative.  Negative for fatigue.  Respiratory: Negative.    Cardiovascular: Negative.   Neurological:  Negative for dizziness and headaches.  Psychiatric/Behavioral: Negative.       Today's Vitals   01/29/24 1414  BP: 110/70  Pulse: 68  Temp: 98.2 F (36.8 C)  TempSrc: Oral  Weight: 186 lb 3.2 oz (84.5 kg)  Height: 5' 4 (1.626 m)  PainSc: 0-No pain   Body mass index is 31.96 kg/m.  Wt Readings from Last 3 Encounters:  01/29/24 186 lb 3.2 oz (84.5 kg)  01/22/24 185 lb (83.9 kg)  12/19/23 182 lb 12.8 oz (82.9 kg)      Objective:  Physical Exam Vitals and nursing note reviewed.  Constitutional:      General: She is not in acute distress.    Appearance: Normal appearance. She is obese.  Cardiovascular:     Rate and Rhythm: Normal rate and regular rhythm.     Pulses: Normal pulses.     Heart sounds: Normal heart sounds. No murmur heard. Pulmonary:     Effort: Pulmonary effort is normal. No respiratory distress.     Breath sounds: No  wheezing.  Neurological:     General: No focal deficit present.     Mental Status: She is alert and oriented to person, place, and time.     Cranial Nerves: No cranial nerve deficit.     Motor: No weakness.  Psychiatric:        Mood and Affect: Mood normal.        Behavior: Behavior normal.        Thought Content: Thought content normal.        Judgment: Judgment normal.     Assessment And Plan:  Mixed hyperlipidemia Assessment & Plan: Cholesterol levels are stable. A low fat, low cholesterol is discussed with the patient   Orders: -     Lipid panel  Abnormal glucose Assessment & Plan: HgbA1c is slightly elevated, continue focusing on healthy diet low in sugar and starches.   Orders: -     Hemoglobin A1c  Influenza vaccination declined  Encounter for screening -     Hepatitis B surface antibody,qualitative  Iron  deficiency anemia secondary to inadequate dietary iron  intake Assessment & Plan: Chronic fatigue possibly linked to caregiving stress and low hemoglobin levels. Recent hemoglobin check confirmed low levels. - Check CBC to assess hemoglobin levels.  Orders: -     CBC  Other fatigue Assessment & Plan: Will check for metabolic causes and since she had gastric bypass she has a high risk for low iron .   Orders: -     TSH  Caregiver stress Assessment & Plan: Significant stress from caregiving for son with autism. Seeking resources for support. - Explore resources for caregiver support and respite care. - Consider referral to social  worker for assistance with accessing programs for her son. - Encourage continued therapy for emotional support.      Return for KEEP SAME NEXT.  Patient was given opportunity to ask questions. Patient verbalized understanding of the plan and was able to repeat key elements of the plan. All questions were answered to their satisfaction.    LILLETTE Sheila Ada, FNP, have reviewed all documentation for this visit. The documentation on 01/29/24 for the exam, diagnosis, procedures, and orders are all accurate and complete.   IF YOU HAVE BEEN REFERRED TO A SPECIALIST, IT MAY TAKE 1-2 WEEKS TO SCHEDULE/PROCESS THE REFERRAL. IF YOU HAVE NOT HEARD FROM US /SPECIALIST IN TWO WEEKS, PLEASE GIVE US  A CALL AT [phone].

## 2024-01-30 LAB — CBC
Hematocrit: 35.4 % (ref 34.0–46.6)
Hemoglobin: 10.8 g/dL — ABNORMAL LOW (ref 11.1–15.9)
MCH: 25.7 pg — ABNORMAL LOW (ref 26.6–33.0)
MCHC: 30.5 g/dL — ABNORMAL LOW (ref 31.5–35.7)
MCV: 84 fL (ref 79–97)
Platelets: 257 x10E3/uL (ref 150–450)
RBC: 4.21 x10E6/uL (ref 3.77–5.28)
RDW: 12.1 % (ref 11.7–15.4)
WBC: 5 x10E3/uL (ref 3.4–10.8)

## 2024-01-30 LAB — LIPID PANEL
Chol/HDL Ratio: 2.3 ratio (ref 0.0–4.4)
Cholesterol, Total: 155 mg/dL (ref 100–199)
HDL: 68 mg/dL (ref >39)
LDL Chol Calc (NIH): 78 mg/dL (ref 0–99)
Triglycerides: 42 mg/dL (ref 0–149)
VLDL Cholesterol Cal: 9 mg/dL (ref 5–40)

## 2024-01-30 LAB — HEPATITIS B SURFACE ANTIBODY,QUALITATIVE: Hep B Surface Ab, Qual: NONREACTIVE

## 2024-01-30 LAB — TSH: TSH: 0.916 u[IU]/mL (ref 0.450–4.500)

## 2024-01-30 LAB — HEMOGLOBIN A1C
Est. average glucose Bld gHb Est-mCnc: 111 mg/dL
Hgb A1c MFr Bld: 5.5 % (ref 4.8–5.6)

## 2024-02-02 ENCOUNTER — Encounter: Payer: Self-pay | Admitting: Plastic Surgery

## 2024-02-11 ENCOUNTER — Ambulatory Visit: Payer: Self-pay | Admitting: Nurse Practitioner

## 2024-02-11 DIAGNOSIS — Z636 Dependent relative needing care at home: Secondary | ICD-10-CM | POA: Insufficient documentation

## 2024-02-11 NOTE — Assessment & Plan Note (Signed)
 Chronic fatigue possibly linked to caregiving stress and low hemoglobin levels. Recent hemoglobin check confirmed low levels. - Check CBC to assess hemoglobin levels.

## 2024-02-11 NOTE — Assessment & Plan Note (Signed)
HgbA1c is slightly elevated, continue focusing on healthy diet low in sugar and starches.

## 2024-02-11 NOTE — Assessment & Plan Note (Signed)
 Significant stress from caregiving for son with autism. Seeking resources for support. - Explore resources for caregiver support and respite care. - Consider referral to social worker for assistance with accessing programs for her son. - Encourage continued therapy for emotional support.

## 2024-02-11 NOTE — Assessment & Plan Note (Signed)
 Will check for metabolic causes and since she had gastric bypass she has a high risk for low iron.

## 2024-02-11 NOTE — Assessment & Plan Note (Signed)
 Cholesterol levels are stable. A low fat, low cholesterol is discussed with the patient

## 2024-02-17 DIAGNOSIS — F330 Major depressive disorder, recurrent, mild: Secondary | ICD-10-CM | POA: Diagnosis not present

## 2024-02-17 DIAGNOSIS — F411 Generalized anxiety disorder: Secondary | ICD-10-CM | POA: Diagnosis not present

## 2024-02-19 DIAGNOSIS — R7303 Prediabetes: Secondary | ICD-10-CM | POA: Diagnosis not present

## 2024-02-19 DIAGNOSIS — D649 Anemia, unspecified: Secondary | ICD-10-CM | POA: Diagnosis not present

## 2024-02-19 DIAGNOSIS — Z9884 Bariatric surgery status: Secondary | ICD-10-CM | POA: Diagnosis not present

## 2024-02-19 DIAGNOSIS — E7800 Pure hypercholesterolemia, unspecified: Secondary | ICD-10-CM | POA: Diagnosis not present

## 2024-02-19 DIAGNOSIS — E6609 Other obesity due to excess calories: Secondary | ICD-10-CM | POA: Diagnosis not present

## 2024-02-19 DIAGNOSIS — E66811 Obesity, class 1: Secondary | ICD-10-CM | POA: Diagnosis not present

## 2024-02-19 DIAGNOSIS — L659 Nonscarring hair loss, unspecified: Secondary | ICD-10-CM | POA: Diagnosis not present

## 2024-03-01 DIAGNOSIS — F411 Generalized anxiety disorder: Secondary | ICD-10-CM | POA: Diagnosis not present

## 2024-03-01 DIAGNOSIS — F330 Major depressive disorder, recurrent, mild: Secondary | ICD-10-CM | POA: Diagnosis not present

## 2024-03-17 DIAGNOSIS — G894 Chronic pain syndrome: Secondary | ICD-10-CM | POA: Diagnosis not present

## 2024-03-17 DIAGNOSIS — M545 Low back pain, unspecified: Secondary | ICD-10-CM | POA: Diagnosis not present

## 2024-03-17 DIAGNOSIS — M5030 Other cervical disc degeneration, unspecified cervical region: Secondary | ICD-10-CM | POA: Diagnosis not present

## 2024-03-17 DIAGNOSIS — M47812 Spondylosis without myelopathy or radiculopathy, cervical region: Secondary | ICD-10-CM | POA: Diagnosis not present

## 2024-03-18 DIAGNOSIS — F330 Major depressive disorder, recurrent, mild: Secondary | ICD-10-CM | POA: Diagnosis not present

## 2024-03-18 DIAGNOSIS — F411 Generalized anxiety disorder: Secondary | ICD-10-CM | POA: Diagnosis not present

## 2024-03-22 ENCOUNTER — Encounter: Payer: Self-pay | Admitting: Dietician

## 2024-03-22 ENCOUNTER — Ambulatory Visit: Admitting: Dietician

## 2024-03-22 ENCOUNTER — Telehealth: Payer: Self-pay | Admitting: Plastic Surgery

## 2024-03-22 ENCOUNTER — Encounter: Attending: General Surgery | Admitting: Dietician

## 2024-03-22 VITALS — Ht 64.0 in | Wt 174.1 lb

## 2024-03-22 DIAGNOSIS — E669 Obesity, unspecified: Secondary | ICD-10-CM | POA: Diagnosis not present

## 2024-03-22 NOTE — Telephone Encounter (Signed)
 New message  Patient calling stating BMI 28.  What's are the next steps

## 2024-03-22 NOTE — Progress Notes (Signed)
 Bariatric Nutrition Follow-Up Visit Medical Nutrition Therapy  Appt Start Time: 1524  End Time: 1556  Surgery date: 03/19/22 Surgery type: RYGB  NUTRITION ASSESSMENT  Anthropometrics  Start weight at [facility]: 241.4 lbs (date: 11/22/2021)  Height: 64 in Weight today: 174.1 lbs  Clinical  Medical hx: Allergies, Anemia, Anxiety, arthritis, asthma, GERD, kidney disease, osteoporosis Medications: Esomeprazole  Labs: Vit D 114 (high) Notable signs/symptoms: nothing noted Any previous deficiencies? No Bowel Habits: Every day to every other day no complaints   Body Composition Scale 05/21/2022 08/20/2022 11/11/2022 04/01/2023 09/25/2023 03/22/2024  Current Body Weight 215.1 198.6 189.3 185.1 183.5 174.1  Total Body Fat % 41.5 39.4 37.9 37.2 36.9 34.3  Visceral Fat 13 11 10 10 10 9   Fat-Free Mass % 58.4 60.5 62.0 62.7 63 65.6   Total Body Water  % 43.7 44.7 45.5 45.8 46 47.3  Muscle-Mass lbs 31.3 31.0 30.9 30.9 30.9 31.5  BMI 36.8 33.9 32.3 31.6 31.3 28.6  Body Fat Displacement               Torso  lbs 55.3 48.4 44.4 42.6 41.8 36.9         Left Leg  lbs 11.0 9.6 8.8 8.5 8.3 7.3         Right Leg  lbs 11.0 9.6 8.8 8.5 8.3 7.3         Left Arm  lbs 5.5 4.8 4.4 4.2 4.1 3.6         Right Arm   lbs 5.5 4.8 4.4 4.2 4.1 3.6     Lifestyle & Dietary Hx  Pt states she tried to got to [facility], stating there was no one there. Pt states she called the number and  no answer. Dietitian recommended that she use the email address to communicate starting [facility]. Pt states she needs her BMI to be below 30 to be considered for panniculectomy, removal of excess loose skin.  Estimated daily fluid intake: 64+ oz Estimated daily protein intake: 70 g Supplements: multivitamin and calcium, biotin Current average weekly physical activity: walking 3 days per week, 30 minutes  24-Hr Dietary Recall: wakes around 9:30 First Meal 10:30am: water , coffee, pre-made protein shake (pt states she is not a breakfast  person) Snack: cheese stick  Second Meal: keto turkey sandwich + quest chips or salad or salmon and broccoli and part of a banana Snack: quest protein chips, fruit Third Meal: chicken or salmon and broccoli/corn or tuna sandwich or egg salad sandwich Snack: sf pop cycle or coffee with protein shake Beverages: water , coffee, vitamin water  zero, water  + flavoring (crystal light)   NUTRITION DIAGNOSIS  Overweight/obesity ([place]-3.3) related to past poor dietary habits and physical inactivity as evidenced by completed bariatric surgery and following dietary guidelines for continued weight loss and healthy nutrition status. Continued   NUTRITION INTERVENTION Nutrition counseling (C-1) and education (E-2) to facilitate bariatric surgery goals, including:  Resistance exercises are especially important after bariatric surgery because they help preserve and build lean muscle mass, which supports metabolism and long-term weight management. Since rapid weight loss can sometimes lead to muscle loss, incorporating strength training--such as using resistance bands, free weights, or bodyweight exercises--ensures the body stays strong and functional. Resistance exercise also improves bone health, enhances balance, and boosts overall energy, making it a vital part of recovery and lifelong success after surgery.  After bariatric surgery, reducing hair loss depends on consistently meeting your protein needs and taking your prescribed bariatric multivitamin to prevent nutrient deficiencies. Hair is made  of protein, so reaching daily goals supports regrowth and strength, while the multivitamin ensures you get essential nutrients like iron , zinc , biotin, vitamin D , and B12, which are critical for healthy hair follicles. Staying hydrated, eating a balanced diet, and following these habits help minimize shedding and promote stronger, healthier hair during recovery.  Goals - Continue: take multivitamin (with iron ) and  calcium - Continue: aim for at least 2 servings of non-starchy vegetables a day. - Re-engage: times 2: increase physical activity; Check out [facility]; add in resistance 2 times a week at least  - Continue: aim for 80 grams of protein per day - Continue: calcium without Vit D (right now getting enough Vit D in the multivitamin) - Continue: eat cooked vegetables (like spinach) for better tolerance  Handouts Previously Provided Include  Body Comp Scale readout  Learning Style & Readiness for Change Teaching method utilized: Visual & Auditory  Demonstrated degree of understanding via: Teach Back  Readiness Level: Preparation Barriers to learning/adherence to lifestyle change: nothing identified  RD's Notes for Next Visit Assess adherence to pt chosen goals  MONITORING & EVALUATION Dietary intake, weekly physical activity, body weight.  Next Steps Patient to return in 3 months for follow-up.

## 2024-03-26 ENCOUNTER — Ambulatory Visit: Admitting: Student

## 2024-03-26 VITALS — BP 127/81 | HR 72 | Ht 64.0 in | Wt 173.0 lb

## 2024-03-26 DIAGNOSIS — M793 Panniculitis, unspecified: Secondary | ICD-10-CM

## 2024-03-26 DIAGNOSIS — M549 Dorsalgia, unspecified: Secondary | ICD-10-CM

## 2024-03-26 DIAGNOSIS — Z6829 Body mass index (BMI) 29.0-29.9, adult: Secondary | ICD-10-CM | POA: Diagnosis not present

## 2024-03-26 DIAGNOSIS — Z9884 Bariatric surgery status: Secondary | ICD-10-CM | POA: Diagnosis not present

## 2024-03-26 DIAGNOSIS — R21 Rash and other nonspecific skin eruption: Secondary | ICD-10-CM | POA: Diagnosis not present

## 2024-03-26 NOTE — Progress Notes (Signed)
   Referring Provider Georgina Speaks, FNP [address] [place],  [place] [address]   CC:  Chief Complaint  Patient presents with   Follow-up      [name] is an 48 y.o. female.  HPI: Patient is a 48 year old female who presents to the clinic today for follow-up to discuss her BMI in the setting of possible panniculectomy surgery.  Patient was seen for initial consult by Dr. Waddell on 01/22/2024.  At this visit, patient reported that she had underwent gastric bypass several years prior and had lost about 65 pounds.  Patient was noted to also have had 2 C-sections and a hysterectomy and had a scar across the lower portion of her abdomen.  Patient reported she had excess skin and fat which on over the scar and was often wet and she reported she had ongoing rashes.  The pannus interfered with her daily activities.  Patient was requesting removal of pannus.  There is no patient had a moderately large pannus and would likely benefit from a panniculectomy.  Her weight was stable.  Plan was to submit for a panniculectomy at her request.  Surgical clearance was sent to her pain medicine doctor to ensure coordination for postoperative pain management.  Today, patient reports she is doing well.  She states that she has been working on weight loss and her weight today is 173 pounds.  Her BMI is 29.70 kg/m, underneath 30 kg/m.  Patient states that she still experiences rashes underneath her pannus.  She states that she has tried creams and ointments to help with the rashes, but they continue to persist.  She states that she has difficulties trying to keep the area clean because if she does not, she develops an odor underneath her pannus.  Patient also states that she recently had an x-ray of her back for ongoing back pain.  She states that the x-ray was negative for any acute issues with her spine.    Patient states that she would like to move forward with panniculectomy surgery.  She denies  any recent fevers, chills or changes in her health.  She denies any history of cardiac disease.  Patient reports she is not a smoker.  She states that she is not taking any blood thinners.  Review of Systems General: Denies any recent fevers, chills or changes in her health  Physical Exam    03/26/2024    3:46 PM 03/22/2024    3:22 PM 01/29/2024    2:14 PM  Vitals with BMI  Height 5' 4 5' 4 5' 4  Weight 173 lbs 174 lbs 2 oz 186 lbs 3 oz  BMI 29.68 29.87 31.95  Systolic 127  110  Diastolic 81  70  Pulse 72  68    General:  No acute distress,  Alert and oriented, Non-Toxic, Normal speech and affect Pulm: Unlabored breathing, no respiratory distress MSK: Ambulatory Psych: Normal mood, normal behavior  Assessment/Plan  Panniculitis   I  discussed with the patient that I will reach out to our surgery schedulers to see if they can work on getting her authorized through her insurance.  Discussed with her that when she is/if she is approved, her surgery will be scheduled.  She expressed understanding.  I instructed the patient to call if she has any further questions or concerns about anything.   Estefana FORBES Peck 03/26/2024, 4:46 PM

## 2024-03-30 DIAGNOSIS — F411 Generalized anxiety disorder: Secondary | ICD-10-CM | POA: Diagnosis not present

## 2024-03-30 DIAGNOSIS — F330 Major depressive disorder, recurrent, mild: Secondary | ICD-10-CM | POA: Diagnosis not present

## 2024-04-13 DIAGNOSIS — F411 Generalized anxiety disorder: Secondary | ICD-10-CM | POA: Diagnosis not present

## 2024-04-13 DIAGNOSIS — F330 Major depressive disorder, recurrent, mild: Secondary | ICD-10-CM | POA: Diagnosis not present

## 2024-04-22 DIAGNOSIS — M25511 Pain in right shoulder: Secondary | ICD-10-CM | POA: Diagnosis not present

## 2024-04-22 DIAGNOSIS — Z9889 Other specified postprocedural states: Secondary | ICD-10-CM | POA: Diagnosis not present

## 2024-04-26 DIAGNOSIS — F411 Generalized anxiety disorder: Secondary | ICD-10-CM | POA: Diagnosis not present

## 2024-04-26 DIAGNOSIS — F330 Major depressive disorder, recurrent, mild: Secondary | ICD-10-CM | POA: Diagnosis not present

## 2024-05-04 ENCOUNTER — Ambulatory Visit
Admission: EM | Admit: 2024-05-04 | Discharge: 2024-05-04 | Disposition: A | Discharge status: Home / Self Care | Attending: Family Medicine | Admitting: Family Medicine

## 2024-05-04 ENCOUNTER — Ambulatory Visit (INDEPENDENT_AMBULATORY_CARE_PROVIDER_SITE_OTHER): Admitting: Student

## 2024-05-04 VITALS — BP 125/74 | HR 86 | Wt 170.0 lb

## 2024-05-04 DIAGNOSIS — R52 Pain, unspecified: Secondary | ICD-10-CM

## 2024-05-04 DIAGNOSIS — B9789 Other viral agents as the cause of diseases classified elsewhere: Secondary | ICD-10-CM

## 2024-05-04 DIAGNOSIS — M793 Panniculitis, unspecified: Secondary | ICD-10-CM | POA: Diagnosis not present

## 2024-05-04 DIAGNOSIS — Z20828 Contact with and (suspected) exposure to other viral communicable diseases: Secondary | ICD-10-CM

## 2024-05-04 DIAGNOSIS — J988 Other specified respiratory disorders: Secondary | ICD-10-CM

## 2024-05-04 LAB — POC COVID19/FLU A&B COMBO
Covid Antigen, POC: NEGATIVE
Influenza A Antigen, POC: NEGATIVE
Influenza B Antigen, POC: NEGATIVE

## 2024-05-04 MED ORDER — HYDROCODONE-ACETAMINOPHEN 5-325 MG PO TABS: 0.5000 | ORAL_TABLET | Freq: Four times a day (QID) | ORAL | 0 refills | Status: AC | PRN

## 2024-05-04 MED ORDER — ONDANSETRON HCL 4 MG PO TABS: 4.0000 mg | ORAL_TABLET | Freq: Three times a day (TID) | ORAL | 0 refills | Status: AC | PRN

## 2024-05-04 MED ORDER — PROMETHAZINE-DM 6.25-15 MG/5ML PO SYRP: 5.0000 mL | ORAL_SOLUTION | Freq: Three times a day (TID) | ORAL | 0 refills | Status: AC | PRN

## 2024-05-04 MED ORDER — PSEUDOEPHEDRINE HCL 30 MG PO TABS: 30.0000 mg | ORAL_TABLET | Freq: Three times a day (TID) | ORAL | 0 refills | Status: AC | PRN

## 2024-05-04 MED ORDER — CETIRIZINE HCL 10 MG PO TABS: 10.0000 mg | ORAL_TABLET | Freq: Every day | ORAL | 0 refills | Status: DC

## 2024-05-04 NOTE — H&P (View-Only) (Signed)
 "    Patient ID: [name], female    DOB: Jun 19, 1975, 48 y.o.   MRN: [id number]  Chief Complaint  Patient presents with   Pre-op Exam      ICD-10-CM   1. Panniculitis  M79.3        History of Present Illness: [name] is a 47 y.o.  female  with a history of panniculitis.  She presents for preoperative evaluation for upcoming procedure, panniculectomy, scheduled for 05/15/23 with Dr. Waddell.  The patient has not had problems with anesthesia.  The patient reports that she had sinus tachycardia many years ago and saw cardiologist for this.  She states that she had many tests done which ended up being negative.  She states that she has not had any issues since and has not seen cardiology since.  Patient denies taking any blood thinners.  Patient reports she is not a smoker.  Patient reports that she takes an estradiol patch for menopause.  She denies any history of greater than 3 miscarriages.  She denies any personal or family history of blood clots or clotting diseases.  She denies any recent surgeries, traumas or infections.  She denies any history of stroke or heart attack.  She denies any history of Crohn's disease or ulcerative colitis, COPD or asthma.  She denies any history of cancer.  She denies any varicosities to her lower extremities.  She denies any recent fevers, chills or changes in her health.  Per chart review, patient's most recent hemoglobin was 10.8.  Patient states that her anemia has been stable.  She states that she takes multivitamins which contain iron .  Summary of Previous Visit: Patient was seen for initial consult by Dr. Waddell on 01/22/2024. At this visit, patient reported that she had underwent gastric bypass several years prior and had lost about 65 pounds. Patient was noted to also have had 2 C-sections and a hysterectomy and had a scar across the lower portion of her abdomen. Patient reported she had excess skin and fat which on over the scar and was  often wet and she reported she had ongoing rashes. The pannus interfered with her daily activities. Patient was requesting removal of pannus. There is no patient had a moderately large pannus and would likely benefit from a panniculectomy. Her weight was stable. Plan was to submit for a panniculectomy at her request. Surgical clearance was sent to her pain medicine doctor to ensure coordination for postoperative pain management.   Per chart review, clearance was received from pain management provider, Merlynn Mt, NP stating postop pain provide by surgery and up to 30 days if needed.  Job: Works as a hospital doctor, planning to take 2 weeks off  PMH Significant for: Headache, GERD, anemia, GAD, panniculitis   Past Medical History: Allergies: Allergies[1]  Current Medications: Current Medications[2]  Past Medical Problems: Past Medical History:  Diagnosis Date   Allergy    Anemia    Anxiety    Apnea 12/08/2019   Arthritis    Asthma    Back pain    Constipation    Exertional dyspnea 03/14/2020   Fatty liver    GERD (gastroesophageal reflux disease)    H/O: hysterectomy    Hx of endometriosis    Inappropriate sinus tachycardia 12/08/2019   Insomnia    Joint pain    Lactose intolerance    Migraine    Morbid obesity (HCC) 12/08/2019   Osteoporosis    Pneumonia    Pre-diabetes  Urticaria    Vitamin D  deficiency     Past Surgical History: Past Surgical History:  Procedure Laterality Date   9 HOUR PH STUDY N/A 01/12/2018   Procedure: 24 HOUR PH STUDY;  Surgeon: Shila Gustav GAILS, MD;  Location: [facility];  Service: Endoscopy;  Laterality: N/A;   ABDOMINAL HYSTERECTOMY  12/12/2003   ADENOIDECTOMY  05/2011   ANKLE ARTHROSCOPY  12/29/2007   right; with extensive debridement   BLADDER NECK RECONSTRUCTION  01/14/2011   Procedure: BLADDER NECK REPAIR;  Surgeon: Glenys GORMAN Birk, MD;  Location: [facility];  Service: Gynecology;  Laterality: N/A;  Laparoscopic Repair of Incidental  Cystotomy   BREAST REDUCTION SURGERY  08/05/2011   Procedure: MAMMARY REDUCTION  (BREAST);  Surgeon: Elna Pick, MD;  Location: [facility];  Service: Plastics;  Laterality: Bilateral;  bilateral   BREAST SURGERY  2012   CESAREAN SECTION  12/29/2000; 1994   CHOLECYSTECTOMY     age 71   DILATION AND CURETTAGE OF UTERUS  07/08/2003   open laparoscopy   ESOPHAGEAL MANOMETRY N/A 01/12/2018   Procedure: ESOPHAGEAL MANOMETRY (EM);  Surgeon: Shila Gustav GAILS, MD;  Location: [facility];  Service: Endoscopy;  Laterality: N/A;   GASTRIC ROUX-EN-Y N/A 03/19/2022   Procedure: LAPAROSCOPIC ROUX-EN-Y GASTRIC BYPASS WITH UPPER ENDOSCOPY;  Surgeon: Tanda Locus, MD;  Location: [facility];  Service: General;  Laterality: N/A;   GIVENS CAPSULE STUDY N/A 06/23/2017   Procedure: GIVENS CAPSULE STUDY;  Surgeon: Leigh Elspeth SQUIBB, MD;  Location: [facility];  Service: Gastroenterology;  Laterality: N/A;   HIATAL HERNIA REPAIR N/A 03/19/2022   Procedure: HERNIA REPAIR HIATAL;  Surgeon: Tanda Locus, MD;  Location: [facility];  Service: General;  Laterality: N/A;   LAPAROSCOPIC SALPINGOOPHERECTOMY  01/14/2011   left   LAPAROSCOPY N/A 11/01/2022   Procedure: LAPAROSCOPY DIAGNOSTIC; LAPARSCOPIC CLOSURE OF PETERSEN SPACE;  Surgeon: Tanda Locus, MD;  Location: [facility];  Service: General;  Laterality: N/A;  DR ELETHA TO ASSIST   PH IMPEDANCE STUDY N/A 01/12/2018   Procedure: PH IMPEDANCE STUDY;  Surgeon: Shila Gustav GAILS, MD;  Location: [facility];  Service: Endoscopy;  Laterality: N/A;   RESECTION DISTAL CLAVICAL Right 09/25/2017   Procedure: RESECTION DISTAL CLAVICAL;  Surgeon: Cristy Bonner DASEN, MD;  Location: [facility];  Service: Orthopedics;  Laterality: Right;   RIGHT OOPHORECTOMY  2011   with lysis of adhesions   SHOULDER ACROMIOPLASTY Right 09/25/2017   Procedure: SHOULDER ACROMIOPLASTY;  Surgeon: Cristy Bonner DASEN, MD;  Location: [facility];  Service:  Orthopedics;  Laterality: Right;   SHOULDER ARTHROSCOPY WITH DEBRIDEMENT AND BICEP TENDON REPAIR Right 09/25/2017   Procedure: SHOULDER ARTHROSCOPY WITH DEBRIDEMENT AND BICEP TENDON REPAIR;  Surgeon: Cristy Bonner DASEN, MD;  Location: [facility];  Service: Orthopedics;  Laterality: Right;   SHOULDER ARTHROSCOPY WITH ROTATOR CUFF REPAIR Right 09/25/2017   Procedure: SHOULDER ARTHROSCOPY WITH ROTATOR CUFF REPAIR;  Surgeon: Cristy Bonner DASEN, MD;  Location: [facility];  Service: Orthopedics;  Laterality: Right;   TONSILLECTOMY  as a child   TUBAL LIGATION  12/29/2000   UPPER GASTROINTESTINAL ENDOSCOPY     UPPER GI ENDOSCOPY N/A 11/01/2022   Procedure: UPPER GI ENDOSCOPY;  Surgeon: Tanda Locus, MD;  Location: [facility];  Service: General;  Laterality: N/A;    Social History: Social History   Socioeconomic History   Marital status: Divorced    Spouse name: Not on file   Number of children: 2   Years  of education: 12th   Highest education level: Not on file  Occupational History   Occupation: Facilities Manager: OTHER    Comment: [employer]  Tobacco Use   Smoking status: Never   Smokeless tobacco: Never  Vaping Use   Vaping status: Never Used  Substance and Sexual Activity   Alcohol use: Yes    Comment: occasional    Drug use: No   Sexual activity: Not Currently    Birth control/protection: Surgical  Other Topics Concern   Not on file  Social History Narrative   Pt lives in 2 story town home with her son   Has 2 children   High school graduate   Bus driver for [employer]   Right handed   Social Drivers of Health   Tobacco Use: Low Risk (05/04/2024)   Patient History    Smoking Tobacco Use: Never    Smokeless Tobacco Use: Never    Passive Exposure: Not on file  Financial Resource Strain: Patient Declined (07/22/2023)   [facility]   Overall Financial Resource Strain (CARDIA)    Difficulty of Paying Living Expenses: Patient  declined  Food Insecurity: Patient Declined (07/22/2023)   Received from [facility]   Epic    Within the past 12 months, you worried that your food would run out before you got the money to buy more.: Patient declined    Within the past 12 months, the food you bought just didn't last and you didn't have money to get more.: Patient declined  Transportation Needs: No Transportation Needs (07/22/2023)   Received from [facility] - Transportation    Lack of Transportation (Medical): No    Lack of Transportation (Non-Medical): No  Physical Activity: Insufficiently Active (07/22/2023)   [facility] from [facility]   Exercise Vital Sign    On average, how many days per week do you engage in moderate to strenuous exercise (like a brisk walk)?: 3 days    On average, how many minutes do you engage in exercise at this level?: 30 min  Stress: No Stress Concern Present (07/22/2023)   Received from [facility] - Occupational Stress Questionnaire    Feeling of Stress : Not at all  Social Connections: Patient Declined (07/22/2023)   Received from [facility]   Social Network    How would you rate your social network (family, work, friends)?: Patient declined  Intimate Partner Violence: Not At Risk (07/22/2023)   Received from [facility]   HITS    Over the last 12 months how often did your partner physically hurt you?: Never    Over the last 12 months how often did your partner insult you or talk down to you?: Never    Over the last 12 months how often did your partner threaten you with physical harm?: Never    Over the last 12 months how often did your partner scream or curse at you?: Never  Depression (PHQ2-9): Low Risk (05/15/2023)   Depression (PHQ2-9)    PHQ-2 Score: 4  Alcohol Screen: Not on file  Housing: Unknown (07/22/2023)   [facility]    In the last 12 months, was there a time when you were not able to pay  the mortgage or rent on time?: Patient declined    In the past 12 months, how many times have you moved where you were living?: 0  At any time in the past 12 months, were you homeless or living in a shelter (including now)?: No  Utilities: Patient Declined (07/22/2023)   Received from [facility]    Threatened with loss of utilities: Patient declined  Health Literacy: Not on file    Family History: Family History  Problem Relation Age of Onset   Diabetes Mother    Hypertension Mother    Hyperlipidemia Mother    Sleep apnea Mother    Thyroid  disease Sister    Dementia Father    Heart attack Maternal Grandmother    Stroke Maternal Grandfather    Asthma Son    Asthma Daughter    Colon cancer Neg Hx    Colon polyps Neg Hx    Esophageal cancer Neg Hx    Rectal cancer Neg Hx    Stomach cancer Neg Hx     Review of Systems: Denies any recent fevers, chills or changes in her health  Physical Exam: Vital Signs BP 125/74 (BP Location: Left Arm, Patient Position: Sitting, Cuff Size: Normal)   Pulse 86   Wt 170 lb (77.1 kg)   LMP 12/27/2003   SpO2 100%   BMI 29.18 kg/m   Physical Exam  Constitutional:      General: Not in acute distress.    Appearance: Normal appearance. Not ill-appearing.  HENT:     Head: Normocephalic and atraumatic.  Neck:     Musculoskeletal: Normal range of motion.  Cardiovascular:     Rate and Rhythm: Normal rate  Pulmonary:     Effort: Pulmonary effort is normal. No respiratory distress.  Musculoskeletal: Normal range of motion.  Skin:    General: Skin is warm and dry.     Findings: No erythema or rash.  Neurological:     Mental Status: Alert and oriented to person, place, and time. Mental status is at baseline.  Psychiatric:        Mood and Affect: Mood normal.        Behavior: Behavior normal.    Assessment/Plan: The patient is scheduled for panniculectomy with Dr. Waddell.  Risks, benefits, and alternatives of  procedure discussed, questions answered and consent obtained.    Will send clearance to patient's PCP  Smoking Status: Non-smoker; Counseling Given?  N/A  Caprini Score: 5; Risk Factors include: Age, BMI > 25, estradiol patch and length of planned surgery. Recommendation for mechanical prophylaxis. Encourage early ambulation.   Pictures obtained: @consult   Post-op Rx sent to pharmacy: Zofran , hydrocodone -acetaminophen  2.5 mg -discussed with the patient that we did receive clearance from her pain management provider to help manage postoperative pain.  I discussed with her we can add on Norco 2.5 mg in addition to her 7.5 mg for postoperative pain.  Patient was in agreement with this.  Discussed with the patient that she should take Tylenol  for pain first before taking Norco.  She expressed understanding.  Discussed with the patient to hold her Voltaren  at least 1 week prior to surgery.  Discussed the patient to hold any multivitamins or supplements at least 1 week prior to surgery.  Recommended that she take iron  pill leading up to surgery if she is no longer taking the multivitamin prior to surgery.  We did discuss that sometimes ingredients and multivitamins can increase her risk of bleeding at the time of surgery.  She expressed understanding of this and I discussed with her she can discuss holding it with her bariatric team/PCP as well.  Patient expressed understanding.  We also discussed that estrogen can increase her risk of developing blood clots.  Recommended that she hold her estrogen patch 2 weeks before and 2 weeks after surgery.  She expressed understanding.  Patient was provided with the General Surgical Risk consent document and Pain Medication Agreement prior to their appointment.  They had adequate time to read through the risk consent documents and Pain Medication Agreement. We also discussed them in person together during this preop appointment. All of their questions were answered  to their satisfaction.  Recommended calling if they have any further questions.  Risk consent form and Pain Medication Agreement to be scanned into patient's chart.  The consent was obtained with risks and complications reviewed which included bleeding, pain, scar, infection and the risk of anesthesia.  The patients questions were answered to the patients expressed satisfaction.    Electronically signed by: Estefana FORBES Peck, PA-C 05/04/2024 5:21 PM      [1]  Allergies Allergen Reactions   Ibuprofen  Other (See Comments)    MD advised pt to not take med- Gastric Bypass!!    Aspirin  Other (See Comments)    MD advised pt to not take med   Bactrim  [Sulfamethoxazole -Trimethoprim ]    Doxycycline     Salmon [Fish Allergy] Swelling   Zoledronic Acid Other (See Comments)    Muscle aches  [2]  Current Outpatient Medications:    acetaminophen  (TYLENOL ) 325 MG tablet, Take 2 tablets (650 mg total) by mouth every 6 (six) hours as needed., Disp: 30 tablet, Rfl: 0   albuterol  (VENTOLIN  HFA) 108 (90 Base) MCG/ACT inhaler, Inhale 1-2 puffs into the lungs every 6 (six) hours as needed for wheezing or shortness of breath., Disp: 1 each, Rfl: 0   benzonatate  (TESSALON  PERLES) 100 MG capsule, Take 1 capsule (100 mg total) by mouth every 6 (six) hours as needed., Disp: 30 capsule, Rfl: 1   calcium carbonate (OS-CAL - DOSED IN MG OF ELEMENTAL CALCIUM) 1250 (500 Ca) MG tablet, Take 1 tablet by mouth daily with breakfast., Disp: , Rfl:    clonazePAM  (KLONOPIN ) 1 MG tablet, Take 0.5-1 mg by mouth at bedtime., Disp: , Rfl:    diclofenac  (VOLTAREN ) 75 MG EC tablet, Take 75 mg by mouth 2 (two) times daily., Disp: , Rfl:    EPINEPHrine  (EPIPEN  2-PAK) 0.3 mg/0.3 mL IJ SOAJ injection, Inject 0.3 mg into the muscle as needed for anaphylaxis., Disp: 2 each, Rfl: 2   ergocalciferol  (VITAMIN D2) 1.25 MG (50000 UT) capsule, Take 50,000 Units by mouth once a week., Disp: , Rfl:    esomeprazole  (NEXIUM ) 40 MG capsule, Take  1 capsule (40 mg total) by mouth daily., Disp: 90 capsule, Rfl: 3   estradiol (CLIMARA - DOSED IN MG/24 HR) 0.1 mg/24hr patch, Place 0.1 mg onto the skin once a week., Disp: , Rfl:    HYDROcodone -acetaminophen  (NORCO) 7.5-325 MG tablet, Take 1 tablet by mouth every 8 (eight) hours as needed for moderate pain., Disp: , Rfl:    HYDROcodone -acetaminophen  (NORCO/VICODIN) 5-325 MG tablet, Take 0.5 tablets by mouth every 6 (six) hours as needed for up to 12 doses for moderate pain (pain score 4-6) or severe pain (pain score 7-10)., Disp: 6 tablet, Rfl: 0   Multiple Vitamins-Minerals (BARIATRIC MULTIVITAMINS/IRON  PO), Take 1 capsule by mouth in the morning and at bedtime., Disp: , Rfl:    ondansetron  (ZOFRAN ) 4 MG tablet, Take 1 tablet (4 mg total) by mouth every 8 (eight) hours as needed for up to  15 doses for nausea or vomiting., Disp: 15 tablet, Rfl: 0   ondansetron  (ZOFRAN -ODT) 4 MG disintegrating tablet, TAKE 1 TABLET BY MOUTH EVERY 8 HOURS AS NEEDED FOR NAUSEA FOR UP TO 7 DAYS, Disp: , Rfl:    RESTASIS 0.05 % ophthalmic emulsion, Place 1 drop into both eyes 2 (two) times daily as needed (dryness)., Disp: , Rfl:    cetirizine  (ZYRTEC  ALLERGY) 10 MG tablet, Take 1 tablet (10 mg total) by mouth daily., Disp: 30 tablet, Rfl: 0   promethazine -dextromethorphan (PROMETHAZINE -DM) 6.25-15 MG/5ML syrup, Take 5 mLs by mouth 3 (three) times daily as needed for cough., Disp: 200 mL, Rfl: 0   pseudoephedrine  (SUDAFED) 30 MG tablet, Take 1 tablet (30 mg total) by mouth every 8 (eight) hours as needed for congestion., Disp: 30 tablet, Rfl: 0  "

## 2024-05-04 NOTE — Progress Notes (Signed)
 "    Patient ID: [name], female    DOB: Jun 19, 1975, 48 y.o.   MRN: [id number]  Chief Complaint  Patient presents with   Pre-op Exam      ICD-10-CM   1. Panniculitis  M79.3        History of Present Illness: [name] is a 48 y.o.  female  with a history of panniculitis.  She presents for preoperative evaluation for upcoming procedure, panniculectomy, scheduled for 05/15/23 with Dr. Waddell.  The patient has not had problems with anesthesia.  The patient reports that she had sinus tachycardia many years ago and saw cardiologist for this.  She states that she had many tests done which ended up being negative.  She states that she has not had any issues since and has not seen cardiology since.  Patient denies taking any blood thinners.  Patient reports she is not a smoker.  Patient reports that she takes an estradiol patch for menopause.  She denies any history of greater than 3 miscarriages.  She denies any personal or family history of blood clots or clotting diseases.  She denies any recent surgeries, traumas or infections.  She denies any history of stroke or heart attack.  She denies any history of Crohn's disease or ulcerative colitis, COPD or asthma.  She denies any history of cancer.  She denies any varicosities to her lower extremities.  She denies any recent fevers, chills or changes in her health.  Per chart review, patient's most recent hemoglobin was 10.8.  Patient states that her anemia has been stable.  She states that she takes multivitamins which contain iron .  Summary of Previous Visit: Patient was seen for initial consult by Dr. Waddell on 01/22/2024. At this visit, patient reported that she had underwent gastric bypass several years prior and had lost about 65 pounds. Patient was noted to also have had 2 C-sections and a hysterectomy and had a scar across the lower portion of her abdomen. Patient reported she had excess skin and fat which on over the scar and was  often wet and she reported she had ongoing rashes. The pannus interfered with her daily activities. Patient was requesting removal of pannus. There is no patient had a moderately large pannus and would likely benefit from a panniculectomy. Her weight was stable. Plan was to submit for a panniculectomy at her request. Surgical clearance was sent to her pain medicine doctor to ensure coordination for postoperative pain management.   Per chart review, clearance was received from pain management provider, Merlynn Mt, NP stating postop pain provide by surgery and up to 30 days if needed.  Job: Works as a hospital doctor, planning to take 2 weeks off  PMH Significant for: Headache, GERD, anemia, GAD, panniculitis   Past Medical History: Allergies: Allergies[1]  Current Medications: Current Medications[2]  Past Medical Problems: Past Medical History:  Diagnosis Date   Allergy    Anemia    Anxiety    Apnea 12/08/2019   Arthritis    Asthma    Back pain    Constipation    Exertional dyspnea 03/14/2020   Fatty liver    GERD (gastroesophageal reflux disease)    H/O: hysterectomy    Hx of endometriosis    Inappropriate sinus tachycardia 12/08/2019   Insomnia    Joint pain    Lactose intolerance    Migraine    Morbid obesity (HCC) 12/08/2019   Osteoporosis    Pneumonia    Pre-diabetes  Urticaria    Vitamin D  deficiency     Past Surgical History: Past Surgical History:  Procedure Laterality Date   9 HOUR PH STUDY N/A 01/12/2018   Procedure: 24 HOUR PH STUDY;  Surgeon: Shila Gustav GAILS, MD;  Location: [facility];  Service: Endoscopy;  Laterality: N/A;   ABDOMINAL HYSTERECTOMY  12/12/2003   ADENOIDECTOMY  05/2011   ANKLE ARTHROSCOPY  12/29/2007   right; with extensive debridement   BLADDER NECK RECONSTRUCTION  01/14/2011   Procedure: BLADDER NECK REPAIR;  Surgeon: Glenys GORMAN Birk, MD;  Location: [facility];  Service: Gynecology;  Laterality: N/A;  Laparoscopic Repair of Incidental  Cystotomy   BREAST REDUCTION SURGERY  08/05/2011   Procedure: MAMMARY REDUCTION  (BREAST);  Surgeon: Elna Pick, MD;  Location: [facility];  Service: Plastics;  Laterality: Bilateral;  bilateral   BREAST SURGERY  2012   CESAREAN SECTION  12/29/2000; 1994   CHOLECYSTECTOMY     age 71   DILATION AND CURETTAGE OF UTERUS  07/08/2003   open laparoscopy   ESOPHAGEAL MANOMETRY N/A 01/12/2018   Procedure: ESOPHAGEAL MANOMETRY (EM);  Surgeon: Shila Gustav GAILS, MD;  Location: [facility];  Service: Endoscopy;  Laterality: N/A;   GASTRIC ROUX-EN-Y N/A 03/19/2022   Procedure: LAPAROSCOPIC ROUX-EN-Y GASTRIC BYPASS WITH UPPER ENDOSCOPY;  Surgeon: Tanda Locus, MD;  Location: [facility];  Service: General;  Laterality: N/A;   GIVENS CAPSULE STUDY N/A 06/23/2017   Procedure: GIVENS CAPSULE STUDY;  Surgeon: Leigh Elspeth SQUIBB, MD;  Location: [facility];  Service: Gastroenterology;  Laterality: N/A;   HIATAL HERNIA REPAIR N/A 03/19/2022   Procedure: HERNIA REPAIR HIATAL;  Surgeon: Tanda Locus, MD;  Location: [facility];  Service: General;  Laterality: N/A;   LAPAROSCOPIC SALPINGOOPHERECTOMY  01/14/2011   left   LAPAROSCOPY N/A 11/01/2022   Procedure: LAPAROSCOPY DIAGNOSTIC; LAPARSCOPIC CLOSURE OF PETERSEN SPACE;  Surgeon: Tanda Locus, MD;  Location: [facility];  Service: General;  Laterality: N/A;  DR ELETHA TO ASSIST   PH IMPEDANCE STUDY N/A 01/12/2018   Procedure: PH IMPEDANCE STUDY;  Surgeon: Shila Gustav GAILS, MD;  Location: [facility];  Service: Endoscopy;  Laterality: N/A;   RESECTION DISTAL CLAVICAL Right 09/25/2017   Procedure: RESECTION DISTAL CLAVICAL;  Surgeon: Cristy Bonner DASEN, MD;  Location: [facility];  Service: Orthopedics;  Laterality: Right;   RIGHT OOPHORECTOMY  2011   with lysis of adhesions   SHOULDER ACROMIOPLASTY Right 09/25/2017   Procedure: SHOULDER ACROMIOPLASTY;  Surgeon: Cristy Bonner DASEN, MD;  Location: [facility];  Service:  Orthopedics;  Laterality: Right;   SHOULDER ARTHROSCOPY WITH DEBRIDEMENT AND BICEP TENDON REPAIR Right 09/25/2017   Procedure: SHOULDER ARTHROSCOPY WITH DEBRIDEMENT AND BICEP TENDON REPAIR;  Surgeon: Cristy Bonner DASEN, MD;  Location: [facility];  Service: Orthopedics;  Laterality: Right;   SHOULDER ARTHROSCOPY WITH ROTATOR CUFF REPAIR Right 09/25/2017   Procedure: SHOULDER ARTHROSCOPY WITH ROTATOR CUFF REPAIR;  Surgeon: Cristy Bonner DASEN, MD;  Location: [facility];  Service: Orthopedics;  Laterality: Right;   TONSILLECTOMY  as a child   TUBAL LIGATION  12/29/2000   UPPER GASTROINTESTINAL ENDOSCOPY     UPPER GI ENDOSCOPY N/A 11/01/2022   Procedure: UPPER GI ENDOSCOPY;  Surgeon: Tanda Locus, MD;  Location: [facility];  Service: General;  Laterality: N/A;    Social History: Social History   Socioeconomic History   Marital status: Divorced    Spouse name: Not on file   Number of children: 2   Years  of education: 12th   Highest education level: Not on file  Occupational History   Occupation: Facilities Manager: OTHER    Comment: [employer]  Tobacco Use   Smoking status: Never   Smokeless tobacco: Never  Vaping Use   Vaping status: Never Used  Substance and Sexual Activity   Alcohol use: Yes    Comment: occasional    Drug use: No   Sexual activity: Not Currently    Birth control/protection: Surgical  Other Topics Concern   Not on file  Social History Narrative   Pt lives in 2 story town home with her son   Has 2 children   High school graduate   Bus driver for [employer]   Right handed   Social Drivers of Health   Tobacco Use: Low Risk (05/04/2024)   Patient History    Smoking Tobacco Use: Never    Smokeless Tobacco Use: Never    Passive Exposure: Not on file  Financial Resource Strain: Patient Declined (07/22/2023)   [facility]   Overall Financial Resource Strain (CARDIA)    Difficulty of Paying Living Expenses: Patient  declined  Food Insecurity: Patient Declined (07/22/2023)   Received from [facility]   Epic    Within the past 12 months, you worried that your food would run out before you got the money to buy more.: Patient declined    Within the past 12 months, the food you bought just didn't last and you didn't have money to get more.: Patient declined  Transportation Needs: No Transportation Needs (07/22/2023)   Received from [facility] - Transportation    Lack of Transportation (Medical): No    Lack of Transportation (Non-Medical): No  Physical Activity: Insufficiently Active (07/22/2023)   [facility] from [facility]   Exercise Vital Sign    On average, how many days per week do you engage in moderate to strenuous exercise (like a brisk walk)?: 3 days    On average, how many minutes do you engage in exercise at this level?: 30 min  Stress: No Stress Concern Present (07/22/2023)   Received from [facility] - Occupational Stress Questionnaire    Feeling of Stress : Not at all  Social Connections: Patient Declined (07/22/2023)   Received from [facility]   Social Network    How would you rate your social network (family, work, friends)?: Patient declined  Intimate Partner Violence: Not At Risk (07/22/2023)   Received from [facility]   HITS    Over the last 12 months how often did your partner physically hurt you?: Never    Over the last 12 months how often did your partner insult you or talk down to you?: Never    Over the last 12 months how often did your partner threaten you with physical harm?: Never    Over the last 12 months how often did your partner scream or curse at you?: Never  Depression (PHQ2-9): Low Risk (05/15/2023)   Depression (PHQ2-9)    PHQ-2 Score: 4  Alcohol Screen: Not on file  Housing: Unknown (07/22/2023)   [facility]    In the last 12 months, was there a time when you were not able to pay  the mortgage or rent on time?: Patient declined    In the past 12 months, how many times have you moved where you were living?: 0  At any time in the past 12 months, were you homeless or living in a shelter (including now)?: No  Utilities: Patient Declined (07/22/2023)   Received from [facility]    Threatened with loss of utilities: Patient declined  Health Literacy: Not on file    Family History: Family History  Problem Relation Age of Onset   Diabetes Mother    Hypertension Mother    Hyperlipidemia Mother    Sleep apnea Mother    Thyroid  disease Sister    Dementia Father    Heart attack Maternal Grandmother    Stroke Maternal Grandfather    Asthma Son    Asthma Daughter    Colon cancer Neg Hx    Colon polyps Neg Hx    Esophageal cancer Neg Hx    Rectal cancer Neg Hx    Stomach cancer Neg Hx     Review of Systems: Denies any recent fevers, chills or changes in her health  Physical Exam: Vital Signs BP 125/74 (BP Location: Left Arm, Patient Position: Sitting, Cuff Size: Normal)   Pulse 86   Wt 170 lb (77.1 kg)   LMP 12/27/2003   SpO2 100%   BMI 29.18 kg/m   Physical Exam  Constitutional:      General: Not in acute distress.    Appearance: Normal appearance. Not ill-appearing.  HENT:     Head: Normocephalic and atraumatic.  Neck:     Musculoskeletal: Normal range of motion.  Cardiovascular:     Rate and Rhythm: Normal rate  Pulmonary:     Effort: Pulmonary effort is normal. No respiratory distress.  Musculoskeletal: Normal range of motion.  Skin:    General: Skin is warm and dry.     Findings: No erythema or rash.  Neurological:     Mental Status: Alert and oriented to person, place, and time. Mental status is at baseline.  Psychiatric:        Mood and Affect: Mood normal.        Behavior: Behavior normal.    Assessment/Plan: The patient is scheduled for panniculectomy with Dr. Waddell.  Risks, benefits, and alternatives of  procedure discussed, questions answered and consent obtained.    Will send clearance to patient's PCP  Smoking Status: Non-smoker; Counseling Given?  N/A  Caprini Score: 5; Risk Factors include: Age, BMI > 25, estradiol patch and length of planned surgery. Recommendation for mechanical prophylaxis. Encourage early ambulation.   Pictures obtained: @consult   Post-op Rx sent to pharmacy: Zofran , hydrocodone -acetaminophen  2.5 mg -discussed with the patient that we did receive clearance from her pain management provider to help manage postoperative pain.  I discussed with her we can add on Norco 2.5 mg in addition to her 7.5 mg for postoperative pain.  Patient was in agreement with this.  Discussed with the patient that she should take Tylenol  for pain first before taking Norco.  She expressed understanding.  Discussed with the patient to hold her Voltaren  at least 1 week prior to surgery.  Discussed the patient to hold any multivitamins or supplements at least 1 week prior to surgery.  Recommended that she take iron  pill leading up to surgery if she is no longer taking the multivitamin prior to surgery.  We did discuss that sometimes ingredients and multivitamins can increase her risk of bleeding at the time of surgery.  She expressed understanding of this and I discussed with her she can discuss holding it with her bariatric team/PCP as well.  Patient expressed understanding.  We also discussed that estrogen can increase her risk of developing blood clots.  Recommended that she hold her estrogen patch 2 weeks before and 2 weeks after surgery.  She expressed understanding.  Patient was provided with the General Surgical Risk consent document and Pain Medication Agreement prior to their appointment.  They had adequate time to read through the risk consent documents and Pain Medication Agreement. We also discussed them in person together during this preop appointment. All of their questions were answered  to their satisfaction.  Recommended calling if they have any further questions.  Risk consent form and Pain Medication Agreement to be scanned into patient's chart.  The consent was obtained with risks and complications reviewed which included bleeding, pain, scar, infection and the risk of anesthesia.  The patients questions were answered to the patients expressed satisfaction.    Electronically signed by: Estefana FORBES Peck, PA-C 05/04/2024 5:21 PM      [1]  Allergies Allergen Reactions   Ibuprofen  Other (See Comments)    MD advised pt to not take med- Gastric Bypass!!    Aspirin  Other (See Comments)    MD advised pt to not take med   Bactrim  [Sulfamethoxazole -Trimethoprim ]    Doxycycline     Salmon [Fish Allergy] Swelling   Zoledronic Acid Other (See Comments)    Muscle aches  [2]  Current Outpatient Medications:    acetaminophen  (TYLENOL ) 325 MG tablet, Take 2 tablets (650 mg total) by mouth every 6 (six) hours as needed., Disp: 30 tablet, Rfl: 0   albuterol  (VENTOLIN  HFA) 108 (90 Base) MCG/ACT inhaler, Inhale 1-2 puffs into the lungs every 6 (six) hours as needed for wheezing or shortness of breath., Disp: 1 each, Rfl: 0   benzonatate  (TESSALON  PERLES) 100 MG capsule, Take 1 capsule (100 mg total) by mouth every 6 (six) hours as needed., Disp: 30 capsule, Rfl: 1   calcium carbonate (OS-CAL - DOSED IN MG OF ELEMENTAL CALCIUM) 1250 (500 Ca) MG tablet, Take 1 tablet by mouth daily with breakfast., Disp: , Rfl:    clonazePAM  (KLONOPIN ) 1 MG tablet, Take 0.5-1 mg by mouth at bedtime., Disp: , Rfl:    diclofenac  (VOLTAREN ) 75 MG EC tablet, Take 75 mg by mouth 2 (two) times daily., Disp: , Rfl:    EPINEPHrine  (EPIPEN  2-PAK) 0.3 mg/0.3 mL IJ SOAJ injection, Inject 0.3 mg into the muscle as needed for anaphylaxis., Disp: 2 each, Rfl: 2   ergocalciferol  (VITAMIN D2) 1.25 MG (50000 UT) capsule, Take 50,000 Units by mouth once a week., Disp: , Rfl:    esomeprazole  (NEXIUM ) 40 MG capsule, Take  1 capsule (40 mg total) by mouth daily., Disp: 90 capsule, Rfl: 3   estradiol (CLIMARA - DOSED IN MG/24 HR) 0.1 mg/24hr patch, Place 0.1 mg onto the skin once a week., Disp: , Rfl:    HYDROcodone -acetaminophen  (NORCO) 7.5-325 MG tablet, Take 1 tablet by mouth every 8 (eight) hours as needed for moderate pain., Disp: , Rfl:    HYDROcodone -acetaminophen  (NORCO/VICODIN) 5-325 MG tablet, Take 0.5 tablets by mouth every 6 (six) hours as needed for up to 12 doses for moderate pain (pain score 4-6) or severe pain (pain score 7-10)., Disp: 6 tablet, Rfl: 0   Multiple Vitamins-Minerals (BARIATRIC MULTIVITAMINS/IRON  PO), Take 1 capsule by mouth in the morning and at bedtime., Disp: , Rfl:    ondansetron  (ZOFRAN ) 4 MG tablet, Take 1 tablet (4 mg total) by mouth every 8 (eight) hours as needed for up to  15 doses for nausea or vomiting., Disp: 15 tablet, Rfl: 0   ondansetron  (ZOFRAN -ODT) 4 MG disintegrating tablet, TAKE 1 TABLET BY MOUTH EVERY 8 HOURS AS NEEDED FOR NAUSEA FOR UP TO 7 DAYS, Disp: , Rfl:    RESTASIS 0.05 % ophthalmic emulsion, Place 1 drop into both eyes 2 (two) times daily as needed (dryness)., Disp: , Rfl:    cetirizine  (ZYRTEC  ALLERGY) 10 MG tablet, Take 1 tablet (10 mg total) by mouth daily., Disp: 30 tablet, Rfl: 0   promethazine -dextromethorphan (PROMETHAZINE -DM) 6.25-15 MG/5ML syrup, Take 5 mLs by mouth 3 (three) times daily as needed for cough., Disp: 200 mL, Rfl: 0   pseudoephedrine  (SUDAFED) 30 MG tablet, Take 1 tablet (30 mg total) by mouth every 8 (eight) hours as needed for congestion., Disp: 30 tablet, Rfl: 0  "

## 2024-05-04 NOTE — ED Provider Notes (Signed)
 " Producer, Television/film/video - [facility]  Note:  This document was prepared using Conservation officer, historic buildings and may include unintentional dictation errors.  MRN: [id number] DOB: [birth year]  Subjective:   [name] is a 48 y.o. female presenting for 2-day history of a productive cough, sinus congestion, throat pain, drainage.  No chest pain, shortness of breath or wheezing.  Had exposure to influenza with 2 of her children.  Has a history of allergies and asthma.  No smoking of any kind including cigarettes, cigars, vaping, marijuana use.    Current Outpatient Medications  Medication Instructions   acetaminophen  (TYLENOL ) 650 mg, Oral, Every 6 hours PRN   albuterol  (VENTOLIN  HFA) 108 (90 Base) MCG/ACT inhaler 1-2 puffs, Inhalation, Every 6 hours PRN   benzonatate  (TESSALON  PERLES) 100 mg, Oral, Every 6 hours PRN   calcium carbonate (OS-CAL - DOSED IN MG OF ELEMENTAL CALCIUM) 1250 (500 Ca) MG tablet 1 tablet, Daily with breakfast   clonazePAM  (KLONOPIN ) 0.5-1 mg, Daily at bedtime   diclofenac  (VOLTAREN ) 75 mg, 2 times daily   EPINEPHrine  (EPIPEN  2-PAK) 0.3 mg, Intramuscular, As needed   ergocalciferol  (VITAMIN D2) 50,000 Units, Weekly   esomeprazole  (NEXIUM ) 40 mg, Oral, Daily   estradiol (CLIMARA - DOSED IN MG/24 HR) 0.1 mg, Weekly   HYDROcodone -acetaminophen  (NORCO) 7.5-325 MG tablet 1 tablet, Every 8 hours PRN   Multiple Vitamins-Minerals (BARIATRIC MULTIVITAMINS/IRON  PO) 1 capsule, 2 times daily   ondansetron  (ZOFRAN -ODT) 4 MG disintegrating tablet TAKE 1 TABLET BY MOUTH EVERY 8 HOURS AS NEEDED FOR NAUSEA FOR UP TO 7 DAYS   RESTASIS 0.05 % ophthalmic emulsion 1 drop, 2 times daily PRN    Allergies[1]  Past Medical History:  Diagnosis Date   Allergy    Anemia    Anxiety    Apnea 12/08/2019   Arthritis    Asthma    Back pain    Constipation    Exertional dyspnea 03/14/2020   Fatty liver    GERD (gastroesophageal reflux disease)    H/O: hysterectomy    Hx of  endometriosis    Inappropriate sinus tachycardia 12/08/2019   Insomnia    Joint pain    Lactose intolerance    Migraine    Morbid obesity (HCC) 12/08/2019   Osteoporosis    Pneumonia    Pre-diabetes    Urticaria    Vitamin D  deficiency      Past Surgical History:  Procedure Laterality Date   44 HOUR PH STUDY N/A 01/12/2018   Procedure: 24 HOUR PH STUDY;  Surgeon: Shila Gustav GAILS, MD;  Location: [facility];  Service: Endoscopy;  Laterality: N/A;   ABDOMINAL HYSTERECTOMY  12/12/2003   ADENOIDECTOMY  05/2011   ANKLE ARTHROSCOPY  12/29/2007   right; with extensive debridement   BLADDER NECK RECONSTRUCTION  01/14/2011   Procedure: BLADDER NECK REPAIR;  Surgeon: Glenys RAMAN Birk, MD;  Location: [facility];  Service: Gynecology;  Laterality: N/A;  Laparoscopic Repair of Incidental Cystotomy   BREAST REDUCTION SURGERY  08/05/2011   Procedure: MAMMARY REDUCTION  (BREAST);  Surgeon: Elna Pick, MD;  Location: [facility];  Service: Plastics;  Laterality: Bilateral;  bilateral   BREAST SURGERY  2012   CESAREAN SECTION  12/29/2000; 1994   CHOLECYSTECTOMY     age 58   DILATION AND CURETTAGE OF UTERUS  07/08/2003   open laparoscopy   ESOPHAGEAL MANOMETRY N/A 01/12/2018   Procedure: ESOPHAGEAL MANOMETRY (EM);  Surgeon: Shila Gustav GAILS, MD;  Location: [facility];  Service: Endoscopy;  Laterality: N/A;   GASTRIC ROUX-EN-Y N/A 03/19/2022   Procedure: LAPAROSCOPIC ROUX-EN-Y GASTRIC BYPASS WITH UPPER ENDOSCOPY;  Surgeon: Tanda Locus, MD;  Location: [facility];  Service: General;  Laterality: N/A;   GIVENS CAPSULE STUDY N/A 06/23/2017   Procedure: GIVENS CAPSULE STUDY;  Surgeon: Leigh Elspeth SQUIBB, MD;  Location: [facility];  Service: Gastroenterology;  Laterality: N/A;   HIATAL HERNIA REPAIR N/A 03/19/2022   Procedure: HERNIA REPAIR HIATAL;  Surgeon: Tanda Locus, MD;  Location: [facility];  Service: General;  Laterality: N/A;   LAPAROSCOPIC SALPINGOOPHERECTOMY  01/14/2011    left   LAPAROSCOPY N/A 11/01/2022   Procedure: LAPAROSCOPY DIAGNOSTIC; LAPARSCOPIC CLOSURE OF PETERSEN SPACE;  Surgeon: Tanda Locus, MD;  Location: [facility];  Service: General;  Laterality: N/A;  DR ELETHA TO ASSIST   PH IMPEDANCE STUDY N/A 01/12/2018   Procedure: PH IMPEDANCE STUDY;  Surgeon: Shila Gustav GAILS, MD;  Location: [facility];  Service: Endoscopy;  Laterality: N/A;   RESECTION DISTAL CLAVICAL Right 09/25/2017   Procedure: RESECTION DISTAL CLAVICAL;  Surgeon: Cristy Bonner DASEN, MD;  Location: [facility];  Service: Orthopedics;  Laterality: Right;   RIGHT OOPHORECTOMY  2011   with lysis of adhesions   SHOULDER ACROMIOPLASTY Right 09/25/2017   Procedure: SHOULDER ACROMIOPLASTY;  Surgeon: Cristy Bonner DASEN, MD;  Location: [facility];  Service: Orthopedics;  Laterality: Right;   SHOULDER ARTHROSCOPY WITH DEBRIDEMENT AND BICEP TENDON REPAIR Right 09/25/2017   Procedure: SHOULDER ARTHROSCOPY WITH DEBRIDEMENT AND BICEP TENDON REPAIR;  Surgeon: Cristy Bonner DASEN, MD;  Location: [facility];  Service: Orthopedics;  Laterality: Right;   SHOULDER ARTHROSCOPY WITH ROTATOR CUFF REPAIR Right 09/25/2017   Procedure: SHOULDER ARTHROSCOPY WITH ROTATOR CUFF REPAIR;  Surgeon: Cristy Bonner DASEN, MD;  Location: [facility];  Service: Orthopedics;  Laterality: Right;   TONSILLECTOMY  as a child   TUBAL LIGATION  12/29/2000   UPPER GASTROINTESTINAL ENDOSCOPY     UPPER GI ENDOSCOPY N/A 11/01/2022   Procedure: UPPER GI ENDOSCOPY;  Surgeon: Tanda Locus, MD;  Location: [facility];  Service: General;  Laterality: N/A;    Family History  Problem Relation Age of Onset   Diabetes Mother    Hypertension Mother    Hyperlipidemia Mother    Sleep apnea Mother    Thyroid  disease Sister    Dementia Father    Heart attack Maternal Grandmother    Stroke Maternal Grandfather    Asthma Son    Asthma Daughter    Colon cancer Neg Hx    Colon polyps Neg Hx    Esophageal  cancer Neg Hx    Rectal cancer Neg Hx    Stomach cancer Neg Hx     Social History   Occupational History   Occupation: Facilities Manager: OTHER    Comment: [employer]  Tobacco Use   Smoking status: Never   Smokeless tobacco: Never  Vaping Use   Vaping status: Never Used  Substance and Sexual Activity   Alcohol use: Yes    Comment: occasional    Drug use: No   Sexual activity: Not Currently    Birth control/protection: Surgical     ROS   Objective:   Vitals: BP 127/85 (BP Location: Right Arm)   Pulse 77   Temp 98.7 F (37.1 C) (Oral)   Resp 20   LMP 12/27/2003   SpO2 98%   Physical Exam Constitutional:      General: She  is not in acute distress.    Appearance: Normal appearance. She is well-developed and normal weight. She is not ill-appearing, toxic-appearing or diaphoretic.  HENT:     Head: Normocephalic and atraumatic.     Right Ear: Tympanic membrane, ear canal and external ear normal. No drainage or tenderness. No middle ear effusion. There is no impacted cerumen. Tympanic membrane is not erythematous or bulging.     Left Ear: Tympanic membrane, ear canal and external ear normal. No drainage or tenderness.  No middle ear effusion. There is no impacted cerumen. Tympanic membrane is not erythematous or bulging.     Nose: Congestion present. No rhinorrhea.     Mouth/Throat:     Mouth: Mucous membranes are moist. No oral lesions.     Pharynx: No pharyngeal swelling, oropharyngeal exudate, posterior oropharyngeal erythema or uvula swelling.     Tonsils: No tonsillar exudate or tonsillar abscesses. 0 on the right. 0 on the left.  Eyes:     General: No scleral icterus.       Right eye: No discharge.        Left eye: No discharge.     Extraocular Movements: Extraocular movements intact.     Right eye: Normal extraocular motion.     Left eye: Normal extraocular motion.     Conjunctiva/sclera: Conjunctivae normal.  Cardiovascular:     Rate and Rhythm: Normal  rate and regular rhythm.     Heart sounds: Normal heart sounds. No murmur heard.    No friction rub. No gallop.  Pulmonary:     Effort: Pulmonary effort is normal. No respiratory distress.     Breath sounds: No stridor. No wheezing, rhonchi or rales.  Chest:     Chest wall: No tenderness.  Musculoskeletal:     Cervical back: Normal range of motion and neck supple.  Lymphadenopathy:     Cervical: No cervical adenopathy.  Skin:    General: Skin is warm and dry.  Neurological:     General: No focal deficit present.     Mental Status: She is alert and oriented to person, place, and time.  Psychiatric:        Mood and Affect: Mood normal.        Behavior: Behavior normal.     Results for orders placed or performed during the hospital encounter of 05/04/24 (from the past 24 hours)  POC Covid19/Flu A&B Antigen     Status: Normal   Collection Time: 05/04/24  3:40 PM  Result Value Ref Range   Influenza A Antigen, POC Negative Negative   Influenza B Antigen, POC Negative Negative   Covid Antigen, POC Negative Negative   *Note: Due to a large number of results and/or encounters for the requested time period, some results have not been displayed. A complete set of results can be found in Results Review.    Assessment and Plan :   PDMP not reviewed this encounter.  1. Viral respiratory infection   2. Body aches   3. Exposure to influenza      Deferred imaging given clear cardiopulmonary exam, hemodynamically stable vital signs. Suspect viral URI, viral syndrome. Physical exam findings reassuring and vital signs stable for discharge. Advised supportive care, offered symptomatic relief. Counseled patient on potential for adverse effects with medications prescribed/recommended today, ER and return-to-clinic precautions discussed, patient verbalized understanding.       [1]  Allergies Allergen Reactions   Ibuprofen  Other (See Comments)    MD advised pt to not  take med- Gastric  Bypass!!    Aspirin  Other (See Comments)    MD advised pt to not take med   Bactrim  [Sulfamethoxazole -Trimethoprim ]    Doxycycline     Salmon [Fish Allergy] Swelling   Zoledronic Acid Other (See Comments)    Muscle aches     Christopher Savannah, PA-C 05/04/24 1854  "

## 2024-05-04 NOTE — Discharge Instructions (Signed)
 We will manage this as a viral respiratory infection/illness. For sore throat or cough try using a honey-based tea. Use 3 teaspoons of honey with juice squeezed from half lemon. Place shaved pieces of ginger into 1/2-1 cup of water  and warm over stove top. Then mix the ingredients and repeat every 4 hours as needed. Please take Tylenol  500mg -650mg  once every 6 hours for fevers, aches and pains. Hydrate very well with at least 2 liters (64 ounces) of water . Eat light meals such as soups (chicken and noodles, chicken wild rice, vegetable).  Do not eat any foods that you are allergic to.  Start an antihistamine like Zyrtec  (10mg  daily) for postnasal drainage, sinus congestion.  You can take this together with pseudoephedrine  (Sudafed) at a dose of 30 mg 3 times a day or twice daily as needed for the same kind of congestion.

## 2024-05-04 NOTE — ED Triage Notes (Signed)
 Pt c/o prod cough, head/chest congestion, sore throat sx started 12/28-denies fever-NAD-steady gait

## 2024-05-10 ENCOUNTER — Encounter: Admitting: Plastic Surgery

## 2024-05-12 ENCOUNTER — Ambulatory Visit: Payer: Self-pay | Admitting: Family Medicine

## 2024-05-12 ENCOUNTER — Encounter: Payer: Self-pay | Admitting: Family Medicine

## 2024-05-12 ENCOUNTER — Telehealth: Payer: Self-pay

## 2024-05-12 VITALS — BP 110/60 | HR 65 | Temp 97.7°F | Ht 64.0 in | Wt 165.0 lb

## 2024-05-12 DIAGNOSIS — R82998 Other abnormal findings in urine: Secondary | ICD-10-CM | POA: Diagnosis not present

## 2024-05-12 DIAGNOSIS — Z01818 Encounter for other preprocedural examination: Secondary | ICD-10-CM

## 2024-05-12 DIAGNOSIS — D508 Other iron deficiency anemias: Secondary | ICD-10-CM | POA: Diagnosis not present

## 2024-05-12 LAB — POCT URINALYSIS DIP (CLINITEK)
Bilirubin, UA: NEGATIVE
Blood, UA: NEGATIVE
Glucose, UA: NEGATIVE mg/dL
Ketones, POC UA: NEGATIVE mg/dL
Nitrite, UA: NEGATIVE
POC PROTEIN,UA: NEGATIVE
Spec Grav, UA: 1.01
Urobilinogen, UA: 0.2 U/dL
pH, UA: 6.5

## 2024-05-12 NOTE — Progress Notes (Unsigned)
 I,[name], CMA,acting as a neurosurgeon for Sheila Lynch, NP.,have documented all relevant documentation on the behalf of Sheila Creighton, NP,as directed by  Sheila Creighton, NP while in the presence of Sheila Creighton, NP.  Subjective:  Patient ID: [name] , female    DOB: 16-Nov-1975 , 49 y.o.   MRN: [id number]  Chief Complaint  Patient presents with   Pre-op Exam    Patient presents today for a pre op. She reports she was supposed to have surgery Friday but she was sick so she is now scheduled for 05/25/24.    HPI  HPI   Past Medical History:  Diagnosis Date   Anemia    Anxiety    Apnea 12/08/2019   Arthritis    Asthma    Back pain    Exertional dyspnea 03/14/2020   Fatty liver    GERD (gastroesophageal reflux disease)    H/O: hysterectomy    Hx of endometriosis    Inappropriate sinus tachycardia 12/08/2019   Insomnia    Joint pain    Lactose intolerance    Migraine    Morbid obesity (HCC) 12/08/2019   Osteoporosis    Pneumonia    Pre-diabetes    Urticaria    Vitamin D  deficiency      Family History  Problem Relation Age of Onset   Diabetes Mother    Hypertension Mother    Hyperlipidemia Mother    Sleep apnea Mother    Thyroid  disease Sister    Dementia Father    Heart attack Maternal Grandmother    Stroke Maternal Grandfather    Asthma Son    Asthma Daughter    Colon cancer Neg Hx    Colon polyps Neg Hx    Esophageal cancer Neg Hx    Rectal cancer Neg Hx    Stomach cancer Neg Hx     Current Medications[1]   Allergies[2]   Review of Systems   Today's Vitals   05/12/24 1218  BP: 110/60  Pulse: 65  Temp: 97.7 F (36.5 C)  TempSrc: Oral  Weight: 165 lb (74.8 kg)  Height: 5' 4 (1.626 m)  PainSc: 0-No pain   Body mass index is 28.32 kg/m.  Wt Readings from Last 3 Encounters:  05/25/24 170 lb 3.1 oz (77.2 kg)  05/12/24 165 lb (74.8 kg)  05/04/24 170 lb (77.1 kg)    The 10-year ASCVD risk score  (Arnett DK, et al., 2019) is: 0.6%   Values used to calculate the score:     Age: 68 years     Clinically relevant sex: Female     Is Non-Hispanic African American: Yes     Diabetic: No     Tobacco smoker: No     Systolic Blood Pressure: 119 mmHg     Is BP treated: No     HDL Cholesterol: 68 mg/dL     Total Cholesterol: 155 mg/dL  Objective:  Physical Exam      Assessment And Plan:   Assessment & Plan Pre-op exam  Urine white blood cells increased   Orders Placed This Encounter  Procedures   Culture, Urine   Comprehensive metabolic panel with GFR   CBC with Diff   Protime-INR   POCT URINALYSIS DIP (CLINITEK)   EKG 12-Lead    Assessment and Plan Assessment & Plan Preoperative evaluation for panniculectomy Scheduled for panniculectomy on January 20th, 2026, post-significant weight loss. No diabetes, reducing risk of delayed wound healing. EKG normal. - Ordered basic  lab work including blood counts, PT, INR, and urinalysis for surgical clearance.  Anemia Chronic anemia with hemoglobin levels around 10 g/dL. Not severe enough to require transfusion. Aware of condition and monitoring.   Return if symptoms worsen or fail to improve, for keep next appt.  Patient was given opportunity to ask questions. Patient verbalized understanding of the plan and was able to repeat key elements of the plan. All questions were answered to their satisfaction.   I, Sheila Creighton, NP, have reviewed all documentation for this visit. The documentation on 05/25/2024 for the exam, diagnosis, procedures, and orders are all accurate and complete.   IF YOU HAVE BEEN REFERRED TO A SPECIALIST, IT MAY TAKE 1-2 WEEKS TO SCHEDULE/PROCESS THE REFERRAL. IF YOU HAVE NOT HEARD FROM US /SPECIALIST IN TWO WEEKS, PLEASE GIVE US  A CALL AT [phone].        [1]  Current Outpatient Medications:    acetaminophen  (TYLENOL ) 325 MG tablet, Take 2 tablets (650 mg total) by mouth every 6 (six) hours  as needed., Disp: 30 tablet, Rfl: 0   albuterol  (VENTOLIN  HFA) 108 (90 Base) MCG/ACT inhaler, Inhale 1-2 puffs into the lungs every 6 (six) hours as needed for wheezing or shortness of breath., Disp: 1 each, Rfl: 0   calcium carbonate (OS-CAL - DOSED IN MG OF ELEMENTAL CALCIUM) 1250 (500 Ca) MG tablet, Take 1 tablet by mouth daily with breakfast., Disp: , Rfl:    cetirizine  (ZYRTEC  ALLERGY) 10 MG tablet, Take 1 tablet (10 mg total) by mouth daily., Disp: 30 tablet, Rfl: 0   clonazePAM  (KLONOPIN ) 1 MG tablet, Take 0.5-1 mg by mouth at bedtime., Disp: , Rfl:    diclofenac  (VOLTAREN ) 75 MG EC tablet, Take 75 mg by mouth 2 (two) times daily., Disp: , Rfl:    EPINEPHrine  (EPIPEN  2-PAK) 0.3 mg/0.3 mL IJ SOAJ injection, Inject 0.3 mg into the muscle as needed for anaphylaxis., Disp: 2 each, Rfl: 2   ergocalciferol  (VITAMIN D2) 1.25 MG (50000 UT) capsule, Take 50,000 Units by mouth once a week., Disp: , Rfl:    esomeprazole  (NEXIUM ) 40 MG capsule, Take 1 capsule (40 mg total) by mouth daily., Disp: 90 capsule, Rfl: 3   estradiol (CLIMARA - DOSED IN MG/24 HR) 0.1 mg/24hr patch, Place 0.1 mg onto the skin once a week., Disp: , Rfl:    HYDROcodone -acetaminophen  (NORCO) 7.5-325 MG tablet, Take 1 tablet by mouth every 8 (eight) hours as needed for moderate pain., Disp: , Rfl:    HYDROcodone -acetaminophen  (NORCO/VICODIN) 5-325 MG tablet, Take 0.5 tablets by mouth every 6 (six) hours as needed for up to 12 doses for moderate pain (pain score 4-6) or severe pain (pain score 7-10)., Disp: 6 tablet, Rfl: 0   Multiple Vitamins-Minerals (BARIATRIC MULTIVITAMINS/IRON  PO), Take 1 capsule by mouth in the morning and at bedtime., Disp: , Rfl:    ondansetron  (ZOFRAN ) 4 MG tablet, Take 1 tablet (4 mg total) by mouth every 8 (eight) hours as needed for up to 15 doses for nausea or vomiting., Disp: 15 tablet, Rfl: 0   ondansetron  (ZOFRAN -ODT) 4 MG disintegrating tablet, TAKE 1 TABLET BY MOUTH EVERY 8 HOURS AS NEEDED  FOR NAUSEA FOR UP TO 7 DAYS, Disp: , Rfl:    promethazine -dextromethorphan (PROMETHAZINE -DM) 6.25-15 MG/5ML syrup, Take 5 mLs by mouth 3 (three) times daily as needed for cough., Disp: 200 mL, Rfl: 0   pseudoephedrine  (SUDAFED) 30 MG tablet, Take 1 tablet (30 mg total) by mouth every 8 (eight) hours as needed  for congestion., Disp: 30 tablet, Rfl: 0   RESTASIS 0.05 % ophthalmic emulsion, Place 1 drop into both eyes 2 (two) times daily as needed (dryness)., Disp: , Rfl:  No current facility-administered medications for this visit.  Facility-Administered Medications Ordered in Other Visits:    6 CHG cloth bath night before surgery, , , Once **AND** 6 CHG cloth bath AM of surgery, , , Once **AND** Chlorhexidine  Gluconate Cloth 2 % PADS 6 each, 6 each, Topical, Once **AND** Chlorhexidine  Gluconate Cloth 2 % PADS 6 each, 6 each, Topical, Once, Waddell Leonce NOVAK, MD   droperidol  (INAPSINE ) 2.5 MG/ML injection 0.625 mg, 0.625 mg, Intravenous, Once PRN, Jefm Garnette LABOR, MD   fentaNYL  (SUBLIMAZE ) injection 25-50 mcg, 25-50 mcg, Intravenous, Q5 min PRN, Fitzgerald, Robert, MD, 50 mcg at 05/25/24 1211   ondansetron  (ZOFRAN ) injection 4 mg, 4 mg, Intravenous, Once PRN, Jefm Garnette LABOR, MD [2] Allergies Allergen Reactions   Ibuprofen  Other (See Comments)    MD advised pt to not take med- Gastric Bypass!!    Aspirin  Other (See Comments)    MD advised pt to not take med   Bactrim  [Sulfamethoxazole -Trimethoprim ]    Doxycycline     Salmon [Fish Allergy] Swelling   Zoledronic Acid Other (See Comments)    Muscle aches

## 2024-05-12 NOTE — Telephone Encounter (Signed)
 Faxed the Request for Surgical Clearance form to patient's PTCP with confirmed receipt. (Sent as STAT)

## 2024-05-13 LAB — COMPREHENSIVE METABOLIC PANEL WITH GFR
ALT: 63 IU/L — ABNORMAL HIGH (ref 0–32)
AST: 44 IU/L — ABNORMAL HIGH (ref 0–40)
Albumin: 4.5 g/dL (ref 3.9–4.9)
Alkaline Phosphatase: 60 IU/L (ref 41–116)
BUN/Creatinine Ratio: 18 (ref 9–23)
BUN: 13 mg/dL (ref 6–24)
Bilirubin Total: 0.5 mg/dL (ref 0.0–1.2)
CO2: 23 mmol/L (ref 20–29)
Calcium: 9.3 mg/dL (ref 8.7–10.2)
Chloride: 100 mmol/L (ref 96–106)
Creatinine, Ser: 0.72 mg/dL (ref 0.57–1.00)
Globulin, Total: 2.9 g/dL (ref 1.5–4.5)
Glucose: 74 mg/dL (ref 70–99)
Potassium: 4.2 mmol/L (ref 3.5–5.2)
Sodium: 138 mmol/L (ref 134–144)
Total Protein: 7.4 g/dL (ref 6.0–8.5)
eGFR: 103 mL/min/1.73

## 2024-05-13 LAB — CBC WITH DIFFERENTIAL/PLATELET
Basophils Absolute: 0 x10E3/uL (ref 0.0–0.2)
Basos: 0 %
EOS (ABSOLUTE): 0.1 x10E3/uL (ref 0.0–0.4)
Eos: 1 %
Hematocrit: 39.7 % (ref 34.0–46.6)
Hemoglobin: 12.6 g/dL (ref 11.1–15.9)
Immature Grans (Abs): 0 x10E3/uL (ref 0.0–0.1)
Immature Granulocytes: 0 %
Lymphocytes Absolute: 1.9 x10E3/uL (ref 0.7–3.1)
Lymphs: 46 %
MCH: 26.9 pg (ref 26.6–33.0)
MCHC: 31.7 g/dL (ref 31.5–35.7)
MCV: 85 fL (ref 79–97)
Monocytes Absolute: 0.4 x10E3/uL (ref 0.1–0.9)
Monocytes: 10 %
Neutrophils Absolute: 1.7 x10E3/uL (ref 1.4–7.0)
Neutrophils: 43 %
Platelets: 299 x10E3/uL (ref 150–450)
RBC: 4.68 x10E6/uL (ref 3.77–5.28)
RDW: 13 % (ref 11.7–15.4)
WBC: 4.1 x10E3/uL (ref 3.4–10.8)

## 2024-05-13 LAB — PROTIME-INR
INR: 1 (ref 0.9–1.2)
Prothrombin Time: 10.6 s (ref 9.1–12.0)

## 2024-05-14 LAB — URINE CULTURE

## 2024-05-17 ENCOUNTER — Encounter (HOSPITAL_BASED_OUTPATIENT_CLINIC_OR_DEPARTMENT_OTHER): Payer: Self-pay | Admitting: Plastic Surgery

## 2024-05-17 ENCOUNTER — Other Ambulatory Visit: Payer: Self-pay

## 2024-05-17 NOTE — Progress Notes (Signed)
" °   05/17/24 1507  PAT Phone Screen  Is the patient taking a GLP-1 receptor agonist? No  Do You Have Diabetes? No  Do You Have Hypertension? No  Have You Ever Been to the ER for Asthma? No  Have You Taken Oral Steroids in the Past 3 Months? No  Do you Take Phenteramine or any Other Diet Drugs? No  Recent  Lab Work, EKG, CXR? Yes  Where was this test performed? EKG BMP 1/7 Medical clearance appt  Do you have a history of heart problems? No  Have you ever had tests on your heart? Yes  What cardiac tests were performed? Echo;Other (comment)  What date/year were cardiac tests completed? ECHO 04/17/20 EF 60-65% Zio 11/10/19 rare pvc's sinus brady/ tach.  Results viewable: [facility] Media Tab  Any Recent Hospitalizations? No  Height 5' 4 (1.626 m)  Weight 74.8 kg  Pat Appointment Scheduled No  Reason for No Appointment Not Needed    "

## 2024-05-18 ENCOUNTER — Encounter: Payer: Medicaid Other | Admitting: Nurse Practitioner

## 2024-05-19 ENCOUNTER — Encounter: Admitting: Plastic Surgery

## 2024-05-21 MED ORDER — CHLORHEXIDINE GLUCONATE CLOTH 2 % EX PADS: 6.0000 | MEDICATED_PAD | Freq: Once | CUTANEOUS | Status: DC

## 2024-05-21 NOTE — Progress Notes (Signed)

## 2024-05-21 NOTE — Progress Notes (Signed)
 "  PROVIDER:  ERIC DARALYN BLUSH, MD  MRN: [id number] DOB: Sep 17, 1975 DATE OF ENCOUNTER: 05/21/2024 Subjective   Chief Complaint: Follow-up (RETURN WEIGHT LOSS - 3 mo return weight gastric bypass 03/19/22 EW/)    Bariatric History: She has a remote history of Roux-en-Y gastric bypass with sliding hiatal hernia repair November 2023. diagnostic laparoscopy, laparoscopic closure of Petersons hernia and upper endoscopy on November 01 2022; preop weight was 244 pounds   Comorbidities at the time of her initial surgery: Gastroesophageal reflux disease, unspecified whether esophagitis present Prediabetes Hypercholesteremia History of anemia History of Present Illness [name] is a 49 year old female with prior gastric bypass and chronic anemia presenting for long-term follow-up after bariatric surgery.   She is scheduled for panniculectomy with plastic surgery on January 20th, following postponement from January 5th due to an upper respiratory tract infection earlier this month. At that time, she was evaluated in the emergency department for influenza and COVID-19, both of which were negative. Symptoms were limited to the upper respiratory tract without gastrointestinal involvement, and she has since fully recovered without residual symptoms.  Preoperative laboratory testing through her primary care provider included a complete blood count and comprehensive metabolic panel. Hemoglobin was 12 g/dL, which is above her typical baseline of 10 g/dL or less. Renal function was normal. She has been adherent to her multivitamin regimen but discontinued it for the past week per perioperative instructions. She expressed concern about missing her multivitamin.  She has a history of gastric bypass.   Her weight has decreased from 180 lbs in October to 168 lbs currently, reflecting a 12 lb weight loss. She attributes this to resuming dietary practices from the beginning of her weight loss journey.  She  continues to experience hair thinning, which she finds concerning. Vitamin levels, including vitamin A , were checked in October and were normal.  She has previously used phentermine for appetite suppression and found it helpful. She has not taken phentermine recently.   MBSAQIP Long Term Follow Up Questionnaire  (6 months post op and onward)  Diabetes mellitus, on medication: (If diet controlled or no medications, mark no): No GERD, on daily medication: no Hyperlipidemia, on medication: no Hypertension, on medication: no *Number of medications to treat hypertension:0 Sleep apnea requiring CPAP: no   Readmission: no Reoperation: no Intervention Related to Bariatric Procedure: no  Review of Systems: A complete review of systems was obtained from the patient.  I have reviewed this information and discussed as appropriate with the patient.  See HPI as well for other ROS.  ROS    Medical History: Past Medical History:  Diagnosis Date   Allergy    Arthritis    Asthma, unspecified asthma severity, unspecified whether complicated, unspecified whether persistent (HHS-HCC)    GERD (gastroesophageal reflux disease)     Patient Active Problem List  Diagnosis   Chronic sore throat   Otalgia of right ear   Muscle tension dysphonia   Eagle's syndrome   AKI (acute kidney injury)   ANA positive   Anaphylactic reaction due to food, subsequent encounter   Apnea   B12 deficiency   Class 3 severe obesity with serious comorbidity and body mass index (BMI) of 40.0 to 44.9 in adult (CMS-HCC)   Depressive disorder   Endometriosis   Fatty liver   Genital herpes, unspecified   Insomnia   Inappropriate sinus tachycardia (HHS-HCC)   Moderate persistent asthma without complication (HHS-HCC)   Nonorganic sleep disorder, unspecified   Osteoporosis  Other and unspecified hyperlipidemia   PID (acute pelvic inflammatory disease)   Prediabetes   Thoracic or  lumbosacral neuritis or radiculitis, unspecified   Thyroid  nodule   Personal history of unspecified mental disorder   Headache, migraine   Generalized anxiety disorder   Gastroesophageal reflux disease   SOBOE (shortness of breath on exertion)   Daytime somnolence   Preoperative evaluation of a medical condition to rule out surgical contraindications (TAR required)    Past Surgical History:  Procedure Laterality Date   GASTRIC BYPASS AND HIATAL HERNIA REPAIR  03/29/2022   ANKLE SURGERY Right    ARTHROSCOPY SHOULDER     CESAREAN SECTION     CHOLECYSTECTOMY     COMBINED AUGMENTATION MAMMAPLASTY AND ABDOMINOPLASTY     HYSTERECTOMY     TONSILLECTOMY       Allergies  Allergen Reactions   Doxycycline  Other (See Comments)   Fish Containing Products Hives and Swelling   Metoclopramide  Unknown   Sulfamethoxazole -Trimethoprim  Other (See Comments)   Zoledronic Acid Other (See Comments)    Muscle aches   Aspirin  Other (See Comments)    HIVES   Other Hives    [place]    Current Outpatient Medications on File Prior to Visit  Medication Sig Dispense Refill   acetaminophen  (TYLENOL ) 500 MG tablet Take by mouth     albuterol  90 mcg/actuation inhaler      calcium carbonate 500 mg calcium (1,250 mg) tablet Take 500 mg of elemental by mouth once daily     cetirizine  (ZYRTEC ) 10 MG tablet      clonazePAM  (KLONOPIN ) 1 MG tablet Take 1/2 to 1 tablet 2 hours before bedtime 30 tablet 2   ergocalciferol , vitamin D2, 1,250 mcg (50,000 unit) capsule 50,000 Units once a week     fluticasone  furoate-vilanteroL (BREO ELLIPTA ) 100-25 mcg/dose DsDv inhaler      multivitamin tablet Take 1 tablet by mouth once daily     No current facility-administered medications on file prior to visit.    Family History  Problem Relation Age of Onset   Anesthesia problems Neg Hx      Social History   Tobacco Use  Smoking Status Never  Smokeless Tobacco Never     Social  History   Socioeconomic History   Marital status: Divorced   Number of children: 2  Tobacco Use   Smoking status: Never   Smokeless tobacco: Never  Vaping Use   Vaping status: Never Used  Substance and Sexual Activity   Alcohol use: Yes    Comment: ONCE MONTHLY   Drug use: Never   Sexual activity: Defer    Partners: Male   Social Drivers of Health   Financial Resource Strain: Patient Declined (07/22/2023)   Received from [facility]   Overall Financial Resource Strain (CARDIA)    Difficulty of Paying Living Expenses: Patient declined  Food Insecurity: Patient Declined (07/22/2023)   [facility]   Hunger Vital Sign    Within the past 12 months, you worried that your food would run out before you got the money to buy more.: Patient declined    Within the past 12 months, the food you bought just didn't last and you didn't have money to get more.: Patient declined  Transportation Needs: No Transportation Needs (07/22/2023)   Received from [facility] - Transportation    Lack of Transportation (Medical): No    Lack of Transportation (Non-Medical): No  Physical Activity: Insufficiently Active (07/22/2023)  Received from [facility]   Exercise Vital Sign    On average, how many days per week do you engage in moderate to strenuous exercise (like a brisk walk)?: 3 days    On average, how many minutes do you engage in exercise at this level?: 30 min  Stress: No Stress Concern Present (07/22/2023)   Received from [facility] - Occupational Stress Questionnaire    Feeling of Stress : Not at all  Social Connections: Patient Declined (07/22/2023)   Received from [facility]   Social Network    How would you rate your social network (family, work, friends)?: Patient declined  Housing Stability: Unknown (07/22/2023)   Received from [facility] Stability Vital Sign    In the last 12  months, was there a time when you were not able to pay the mortgage or rent on time?: Patient declined    In the past 12 months, how many times have you moved where you were living?: 0    At any time in the past 12 months, were you homeless or living in a shelter (including now)?: No    Objective:    Vitals:   05/21/24 1452  BP: 110/74  Pulse: 84  Temp: 36.6 C (97.8 F)  TempSrc: Temporal  SpO2: 99%  Weight: 76.1 kg (167 lb 12.8 oz)  Height: 162.6 cm (5' 4)  PainSc: 0-No pain    Body mass index is 28.8 kg/m. Wt Readings from Last 10 Encounters:  05/21/24 76.1 kg (167 lb 12.8 oz)  02/19/24 81.4 kg (179 lb 6.4 oz)  07/03/23 82.1 kg (181 lb)  03/11/23 86.2 kg (190 lb)  11/21/22 84.6 kg (186 lb 9.6 oz)  09/06/22 89.8 kg (198 lb)  06/06/22 95.6 kg (210 lb 12.8 oz)  05/10/22 99.7 kg (219 lb 12.8 oz)  04/11/22 (!) 103.4 kg (228 lb)  03/07/22 (!) 110.7 kg (244 lb)    PE Chaperone note: No sensitive exam performed  Gen: alert, NAD, non-toxic appearing Pupils: equal, no scleral icterus Pulm:  symmetric chest rise Ext: no edema,  Skin: no rash, no jaundice   Labs, Imaging and Diagnostic Testing:  Plastic surgery office note May 04, 2024 CBC May 12, 2024-normal; comprehensive metabolic panel normal except for mildly elevated AST of 44, ALT 63  Assessment and Plan:  Diagnoses and all orders for this visit:  Overweight  History of gastric bypass  Hair loss  History of iron  deficiency anemia      Assessment & Plan Obesity after gastric bypass She has achieved additional weight loss since October following gastric bypass from a few years ago and is scheduled for panniculectomy with plastic surgery. Appetite suppression with phentermine was previously effective, but she has withheld use due to upcoming surgery. She is aware of perioperative medication restrictions, specifically the contraindication of NSAIDs due to increased risk of ulceration post-gastric  bypass. Acetaminophen  is preferred for postoperative analgesia, and a single dose of IV ketorolac  may be acceptable, but scheduled NSAIDs are contraindicated. - Advised strict avoidance of NSAIDs, including scheduled ibuprofen , perioperatively and postoperatively due to ulcer risk after gastric bypass. - Permitted acetaminophen  for postoperative pain control. - Discussed that a single dose of IV ketorolac  in the immediate postoperative period is acceptable, but scheduled NSAIDs remain contraindicated. - Instructed to contact the office after panniculectomy to discuss resuming phentermine if postoperative recovery is uncomplicated. - Encouraged to reach out to plastic surgery for any  panniculectomy-related questions. - Total weight loss has been 76 pounds reflecting a total body weight change of 31% after gastric bypass  Hair loss after bariatric surgery She continues to experience hair thinning post-bariatric surgery. Recent laboratory evaluation demonstrated normal vitamin levels. Inadequate protein intake may exacerbate hair loss. - Emphasized importance of maintaining 60-70 grams of daily protein intake to mitigate hair loss. - Recommended consideration of over-the-counter biotin supplementation, as some patients report benefit. - Confirmed normal recent vitamin levels.  History of chronic anemia She has chronic anemia, but recent laboratory evaluation showed normal hemoglobin and no evidence of anemia. She has been adherent to her multivitamin regimen except for a brief perioperative discontinuation, which is unlikely to cause harm. Resuming multivitamin postoperatively is important, especially if gastrointestinal symptoms occur. - Reviewed recent CBC showing normal hemoglobin and absence of anemia. - Discussed rationale for holding multivitamin prior to surgery due to bleeding risk. - Advised resumption of multivitamin postoperatively, particularly if gastrointestinal symptoms such as vomiting  develop, to prevent micronutrient deficiencies.  This patient encounter took 20 minutes today to perform the following: take history, perform exam, review outside records, interpret imaging, counsel the patient on their diagnosis and document encounter, findings & plan in the EHR  Return in about 10 months (around 03/21/2025) for LTF-Bariatrics.  This note has been created using automated tools and reviewed for accuracy by ERIC MCADAMS [name].   Sheila HERO. Wilson MD FACS General, Minimally Invasive, & Bariatric Surgery "

## 2024-05-23 NOTE — Anesthesia Preprocedure Evaluation (Signed)
 "                                  Anesthesia Evaluation  Patient identified by MRN, date of birth, ID band Patient awake    Reviewed: Allergy & Precautions, NPO status , Patient's Chart, lab work & pertinent test results  Airway Mallampati: II  TM Distance: >3 FB Neck ROM: Full    Dental   Pulmonary asthma    breath sounds clear to auscultation       Cardiovascular negative cardio ROS  Rhythm:Regular Rate:Normal     Neuro/Psych  Headaches PSYCHIATRIC DISORDERS Anxiety Depression     Neuromuscular disease    GI/Hepatic Neg liver ROS,GERD  ,,S/p roux-en-y gastric bypass   Endo/Other  negative endocrine ROS    Renal/GU negative Renal ROSLab Results      Component                Value               Date                          K                        4.2                 05/12/2024                     CREATININE               0.72                05/12/2024                     Musculoskeletal  (+) Arthritis ,    Abdominal   Peds  Hematology negative hematology ROS (+) Lab Results      Component                Value               Date                      WBC                      4.1                 05/12/2024                HGB                      12.6                05/12/2024                HCT                      39.7                05/12/2024                MCV                      85  05/12/2024                PLT                      299                 05/12/2024              Anesthesia Other Findings ALL: ibuprofen , asa, bactrim , doxycyline  Reproductive/Obstetrics negative OB ROS                              Anesthesia Physical Anesthesia Plan  ASA: 2  Anesthesia Plan: General   Post-op Pain Management: Tylenol  PO (pre-op)* and Precedex    Induction: Intravenous  PONV Risk Score and Plan: 3 and Midazolam , Dexamethasone , Ondansetron  and Treatment may vary due to age or medical  condition  Airway Management Planned: Oral ETT  Additional Equipment: None  Intra-op Plan:   Post-operative Plan: Extubation in OR  Informed Consent: I have reviewed the patients History and Physical, chart, labs and discussed the procedure including the risks, benefits and alternatives for the proposed anesthesia with the patient or authorized representative who has indicated his/her understanding and acceptance.     Dental advisory given  Plan Discussed with:   Anesthesia Plan Comments:          Anesthesia Quick Evaluation  "

## 2024-05-24 ENCOUNTER — Telehealth: Payer: Self-pay

## 2024-05-24 NOTE — Telephone Encounter (Signed)
 CONFIRMED patient can walk up steps and carry groceries without SOB.

## 2024-05-25 ENCOUNTER — Encounter (HOSPITAL_BASED_OUTPATIENT_CLINIC_OR_DEPARTMENT_OTHER): Payer: Self-pay | Admitting: Plastic Surgery

## 2024-05-25 ENCOUNTER — Encounter (HOSPITAL_BASED_OUTPATIENT_CLINIC_OR_DEPARTMENT_OTHER): Payer: Self-pay | Admitting: Anesthesiology

## 2024-05-25 ENCOUNTER — Other Ambulatory Visit: Payer: Self-pay

## 2024-05-25 ENCOUNTER — Ambulatory Visit (HOSPITAL_BASED_OUTPATIENT_CLINIC_OR_DEPARTMENT_OTHER)
Admission: RE | Admit: 2024-05-25 | Discharge: 2024-05-25 | Disposition: A | Discharge status: Home / Self Care | Attending: Plastic Surgery | Admitting: Plastic Surgery

## 2024-05-25 ENCOUNTER — Ambulatory Visit (HOSPITAL_BASED_OUTPATIENT_CLINIC_OR_DEPARTMENT_OTHER): Payer: Self-pay | Admitting: Anesthesiology

## 2024-05-25 ENCOUNTER — Encounter (HOSPITAL_BASED_OUTPATIENT_CLINIC_OR_DEPARTMENT_OTHER)
Admission: RE | Disposition: A | Discharge status: Home / Self Care | Payer: Self-pay | Source: Home / Self Care | Attending: Plastic Surgery

## 2024-05-25 DIAGNOSIS — M793 Panniculitis, unspecified: Secondary | ICD-10-CM

## 2024-05-25 DIAGNOSIS — Z79899 Other long term (current) drug therapy: Secondary | ICD-10-CM | POA: Diagnosis not present

## 2024-05-25 DIAGNOSIS — Z9884 Bariatric surgery status: Secondary | ICD-10-CM | POA: Insufficient documentation

## 2024-05-25 DIAGNOSIS — Z79890 Hormone replacement therapy (postmenopausal): Secondary | ICD-10-CM | POA: Insufficient documentation

## 2024-05-25 DIAGNOSIS — K219 Gastro-esophageal reflux disease without esophagitis: Secondary | ICD-10-CM | POA: Insufficient documentation

## 2024-05-25 DIAGNOSIS — D649 Anemia, unspecified: Secondary | ICD-10-CM | POA: Insufficient documentation

## 2024-05-25 HISTORY — PX: PANNICULECTOMY: SHX5360

## 2024-05-25 HISTORY — PX: ABDOMINAL WALL DEFECT REPAIR: SHX53

## 2024-05-25 MED ORDER — FENTANYL CITRATE (PF) 100 MCG/2ML IJ SOLN
INTRAMUSCULAR | Status: AC
  Filled 2024-05-25: qty 2

## 2024-05-25 MED ORDER — MIDAZOLAM HCL 5 MG/5ML IJ SOLN
INTRAMUSCULAR | Status: DC | PRN
  Administered 2024-05-25: 2 mg via INTRAVENOUS

## 2024-05-25 MED ORDER — LACTATED RINGERS IV SOLN: INTRAVENOUS | Status: DC | PRN

## 2024-05-25 MED ORDER — LIDOCAINE 2% (20 MG/ML) 5 ML SYRINGE
INTRAMUSCULAR | Status: AC
  Filled 2024-05-25: qty 5

## 2024-05-25 MED ORDER — ONDANSETRON HCL 4 MG/2ML IJ SOLN
INTRAMUSCULAR | Status: AC
  Filled 2024-05-25: qty 2

## 2024-05-25 MED ORDER — SUGAMMADEX SODIUM 200 MG/2ML IV SOLN
INTRAVENOUS | Status: DC | PRN
  Administered 2024-05-25: 200 mg via INTRAVENOUS

## 2024-05-25 MED ORDER — DEXAMETHASONE SODIUM PHOSPHATE 4 MG/ML IJ SOLN
INTRAMUSCULAR | Status: DC | PRN
  Administered 2024-05-25: 10 mg via INTRAVENOUS

## 2024-05-25 MED ORDER — OXYCODONE HCL 5 MG PO TABS
ORAL_TABLET | ORAL | Status: AC
  Filled 2024-05-25: qty 1

## 2024-05-25 MED ORDER — 0.9 % SODIUM CHLORIDE (POUR BTL) OPTIME
TOPICAL | Status: DC | PRN
  Administered 2024-05-25 (×2): 1000 mL

## 2024-05-25 MED ORDER — SODIUM CHLORIDE (PF) 0.9 % IJ SOLN
INTRAMUSCULAR | Status: DC | PRN
  Administered 2024-05-25: 100 mL

## 2024-05-25 MED ORDER — SODIUM CHLORIDE (PF) 0.9 % IJ SOLN
INTRAMUSCULAR | Status: AC
  Filled 2024-05-25: qty 50

## 2024-05-25 MED ORDER — BUPIVACAINE HCL (PF) 0.25 % IJ SOLN
INTRAMUSCULAR | Status: AC
  Filled 2024-05-25: qty 30

## 2024-05-25 MED ORDER — BUPIVACAINE LIPOSOME 1.3 % IJ SUSP
INTRAMUSCULAR | Status: AC
  Filled 2024-05-25: qty 20

## 2024-05-25 MED ORDER — MIDAZOLAM HCL 2 MG/2ML IJ SOLN
INTRAMUSCULAR | Status: AC
  Filled 2024-05-25: qty 2

## 2024-05-25 MED ORDER — BUPIVACAINE HCL (PF) 0.5 % IJ SOLN
INTRAMUSCULAR | Status: AC
  Filled 2024-05-25: qty 30

## 2024-05-25 MED ORDER — FENTANYL CITRATE (PF) 100 MCG/2ML IJ SOLN
INTRAMUSCULAR | Status: DC | PRN
  Administered 2024-05-25 (×2): 100 ug via INTRAVENOUS

## 2024-05-25 MED ORDER — DEXMEDETOMIDINE HCL IN NACL 80 MCG/20ML IV SOLN
INTRAVENOUS | Status: DC | PRN
  Administered 2024-05-25: 8 ug via INTRAVENOUS
  Administered 2024-05-25: 4 ug via INTRAVENOUS

## 2024-05-25 MED ORDER — PROPOFOL 500 MG/50ML IV EMUL
INTRAVENOUS | Status: AC
  Filled 2024-05-25: qty 50

## 2024-05-25 MED ORDER — ROCURONIUM BROMIDE 100 MG/10ML IV SOLN
INTRAVENOUS | Status: DC | PRN
  Administered 2024-05-25: 20 mg via INTRAVENOUS
  Administered 2024-05-25: 40 mg via INTRAVENOUS

## 2024-05-25 MED ORDER — ONDANSETRON HCL 4 MG/2ML IJ SOLN
INTRAMUSCULAR | Status: DC | PRN
  Administered 2024-05-25: 4 mg via INTRAVENOUS

## 2024-05-25 MED ORDER — HYDROMORPHONE HCL 1 MG/ML IJ SOLN
INTRAMUSCULAR | Status: AC
  Filled 2024-05-25: qty 0.5

## 2024-05-25 MED ORDER — PROPOFOL 10 MG/ML IV BOLUS
INTRAVENOUS | Status: DC | PRN
  Administered 2024-05-25: 60 mg via INTRAVENOUS

## 2024-05-25 MED ORDER — ACETAMINOPHEN 500 MG PO TABS
ORAL_TABLET | ORAL | Status: AC
  Filled 2024-05-25: qty 2

## 2024-05-25 MED ORDER — ACETAMINOPHEN 500 MG PO TABS
1000.0000 mg | ORAL_TABLET | Freq: Once | ORAL | Status: AC
  Administered 2024-05-25: 1000 mg via ORAL

## 2024-05-25 MED ORDER — DROPERIDOL 2.5 MG/ML IJ SOLN: 0.6250 mg | Freq: Once | INTRAMUSCULAR | Status: DC | PRN

## 2024-05-25 MED ORDER — CEFAZOLIN SODIUM-DEXTROSE 2-4 GM/100ML-% IV SOLN
INTRAVENOUS | Status: AC
  Filled 2024-05-25: qty 100

## 2024-05-25 MED ORDER — OXYCODONE HCL 5 MG PO TABS
5.0000 mg | ORAL_TABLET | Freq: Once | ORAL | Status: AC | PRN
  Administered 2024-05-25: 5 mg via ORAL

## 2024-05-25 MED ORDER — TRANEXAMIC ACID 1000 MG/10ML IV SOLN
INTRAVENOUS | Status: AC
  Filled 2024-05-25: qty 30

## 2024-05-25 MED ORDER — ONDANSETRON HCL 4 MG/2ML IJ SOLN: 4.0000 mg | Freq: Once | INTRAMUSCULAR | Status: DC | PRN

## 2024-05-25 MED ORDER — ROCURONIUM BROMIDE 10 MG/ML (PF) SYRINGE
PREFILLED_SYRINGE | INTRAVENOUS | Status: AC
  Filled 2024-05-25: qty 10

## 2024-05-25 MED ORDER — CEFAZOLIN SODIUM-DEXTROSE 2-4 GM/100ML-% IV SOLN
2.0000 g | INTRAVENOUS | Status: AC
  Administered 2024-05-25: 2 g via INTRAVENOUS

## 2024-05-25 MED ORDER — FENTANYL CITRATE (PF) 100 MCG/2ML IJ SOLN
25.0000 ug | INTRAMUSCULAR | Status: DC | PRN
  Administered 2024-05-25 (×3): 50 ug via INTRAVENOUS

## 2024-05-25 MED ORDER — OXYCODONE HCL 5 MG/5ML PO SOLN: 5.0000 mg | Freq: Once | ORAL | Status: AC | PRN

## 2024-05-25 MED ORDER — DEXAMETHASONE SOD PHOSPHATE PF 10 MG/ML IJ SOLN
INTRAMUSCULAR | Status: AC
  Filled 2024-05-25: qty 1

## 2024-05-25 MED ORDER — HYDROMORPHONE HCL 1 MG/ML IJ SOLN
INTRAMUSCULAR | Status: DC | PRN
  Administered 2024-05-25: .5 mg via INTRAVENOUS

## 2024-05-25 NOTE — Interval H&P Note (Signed)
 History and Physical Interval Note: No change in exam or indication for surgery All questions answered Marked for a panniculectomy with her assistance Will proceed at her request  05/25/2024 7:14 AM  [name]  has presented today for surgery, with the diagnosis of m79.3.  The various methods of treatment have been discussed with the patient and family. After consideration of risks, benefits and other options for treatment, the patient has consented to  Procedures: PANNICULECTOMY (N/A) as a surgical intervention.  The patient's history has been reviewed, patient examined, no change in status, stable for surgery.  I have reviewed the patient's chart and labs.  Questions were answered to the patient's satisfaction.     Leonce KATHEE Birmingham

## 2024-05-25 NOTE — OR Nursing (Signed)
 Pannus weighed and to be discarded per Dr. Waddell instruction.

## 2024-05-25 NOTE — Op Note (Signed)
 DATE OF OPERATION: 05/25/2024  LOCATION: [facility] operating Room  PREOPERATIVE DIAGNOSIS: Panniculitis  POSTOPERATIVE DIAGNOSIS: Same, abdominal wall defect  PROCEDURE: Panniculectomy with repair of abdominal wall defect  SURGEON: Marinell Birmingham, MD  ASSISTANT: None  EBL: 50 cc  CONDITION: Stable  COMPLICATIONS: None  INDICATION: The patient, [name], is a 49 y.o. female born on 11-07-1975, is here for treatment of ongoing rashes on the posterior aspect of the pannus.   PROCEDURE DETAILS:  The patient was seen prior to surgery and marked.  The IV antibiotics were given. The patient was taken to the operating room and given a general anesthetic. A standard time out was performed and all information was confirmed by those in the room. SCDs were placed.   The abdomen is prepped and draped in usual sterile manner.  A low transverse incision was made across the abdomen sharply and the electrocautery was used to dissect the skin and fat to the anterior abdominal wall.  There was a significant amount of scar tissue in the midline from her previous C-sections and her hysterectomy.  This was divided sharply with the electrocautery.  I encountered a very small abdominal wall defect approximately 5 mm in size at the left lateral border of the rectus fascia.  The contents were reduced without difficulty into the abdominal cavity.  The defect was closed with interrupted 2-0 Prolene sutures and the abdominal wall imbricated over the defect with interrupted 2-0 PDS sutures.  The abdominal wall was then elevated in a caudad to cranial manner to the level of the umbilicus.  An appropriate point for the counterincision was identified and the skin was incised and the skin and fat removed with the electrocautery.  Meticulous hemostasis was achieved.  The subfascial space over the rectus and external obliques as well as subcutaneous tissues were then infiltrated with 100 mL of a solution of quarter  percent Marcaine , Exparel , and saline.  The surgical bed was irrigated with warm normal saline.  A Valsalva was held for 10 seconds and no bleeding was appreciated.  219 French round drains were placed in the surgical bed and brought out through separate stab incisions.  The skin edges were tailor tacked in place with skin clips and Scarpa's fascia approximated with interrupted 2-0 PDS sutures.  The dermis was closed with interrupted and running 3-0 Monocryl sutures and the skin closed with a running 4-0 Monocryl subcuticular stitch.  The incision was sealed with Dermabond and sterile dressings as well as a compressive garment were placed.  All instrument needle and sponge counts were reported as correct and no complications were appreciated during the procedure.  The patient was transferred to the recovery room in good condition. The patient was allowed to wake up and taken to recovery room in stable condition at the end of the case. The family was notified at the end of the case.

## 2024-05-25 NOTE — Anesthesia Procedure Notes (Signed)
 Procedure Name: Intubation Date/Time: 05/25/2024 8:17 AM  Performed by: Pam Macario BROCKS, CRNAPre-anesthesia Checklist: Patient identified, Emergency Drugs available, Suction available, Patient being monitored and Timeout performed Patient Re-evaluated:Patient Re-evaluated prior to induction Oxygen Delivery Method: Circle system utilized Preoxygenation: Pre-oxygenation with 100% oxygen Induction Type: IV induction Ventilation: Mask ventilation without difficulty Laryngoscope Size: Mac and 3 Grade View: Grade II Tube type: Oral Tube size: 7.0 mm Number of attempts: 1 Airway Equipment and Method: Stylet and Oral airway Placement Confirmation: ETT inserted through vocal cords under direct vision, positive ETCO2, breath sounds checked- equal and bilateral and CO2 detector Secured at: 22 cm Tube secured with: Tape Dental Injury: Teeth and Oropharynx as per pre-operative assessment

## 2024-05-25 NOTE — Discharge Instructions (Addendum)
 Please record drain output and ensure bulbs have proper suction at all times. Wear compressive garment at all times unless washing the garment or showering. May shower tomorrow afternoon. No heavy lifting greater than 20 pounds. No vigorous activity. Please ambulate beginning today.  Ambulate a minimum of 10 minutes/h while awake Please call the office for any questions or concerns, [phone]   **No Tylenol  until after 1:00 pm   Post Anesthesia Home Care Instructions  Activity: Get plenty of rest for the remainder of the day. A responsible individual must stay with you for 24 hours following the procedure.  For the next 24 hours, DO NOT: -Drive a car -Advertising copywriter -Drink alcoholic beverages -Take any medication unless instructed by your physician -Make any legal decisions or sign important papers.  Meals: Start with liquid foods such as gelatin or soup. Progress to regular foods as tolerated. Avoid greasy, spicy, heavy foods. If nausea and/or vomiting occur, drink only clear liquids until the nausea and/or vomiting subsides. Call your physician if vomiting continues.  Special Instructions/Symptoms: Your throat may feel dry or sore from the anesthesia or the breathing tube placed in your throat during surgery. If this causes discomfort, gargle with warm salt water . The discomfort should disappear within 24 hours.  If you had a scopolamine  patch placed behind your ear for the management of post- operative nausea and/or vomiting:  1. The medication in the patch is effective for 72 hours, after which it should be removed.  Wrap patch in a tissue and discard in the trash. Wash hands thoroughly with soap and water . 2. You may remove the patch earlier than 72 hours if you experience unpleasant side effects which may include dry mouth, dizziness or visual disturbances. 3. Avoid touching the patch. Wash your hands with soap and water  after contact with the patch.    Information for  Discharge Teaching: EXPAREL  (bupivacaine  liposome injectable suspension)   Pain relief is important to your recovery. The goal is to control your pain so you can move easier and return to your normal activities as soon as possible after your procedure. Your physician may use several types of medicines to manage pain, swelling, and more.  Your surgeon or anesthesiologist gave you EXPAREL (bupivacaine ) to help control your pain after surgery.  EXPAREL  is a local anesthetic designed to release slowly over an extended period of time to provide pain relief by numbing the tissue around the surgical site. EXPAREL  is designed to release pain medication over time and can control pain for up to 72 hours. Depending on how you respond to EXPAREL , you may require less pain medication during your recovery. EXPAREL  can help reduce or eliminate the need for opioids during the first few days after surgery when pain relief is needed the most. EXPAREL  is not an opioid and is not addictive. It does not cause sleepiness or sedation.   Important! A teal colored band has been placed on your arm with the date, time and amount of EXPAREL  you have received. Please leave this armband in place for the full 96 hours following administration, and then you may remove the band. If you return to the hospital for any reason within 96 hours following the administration of EXPAREL , the armband provides important information that your health care providers to know, and alerts them that you have received this anesthetic.    Possible side effects of EXPAREL : Temporary loss of sensation or ability to move in the area where medication was injected. Nausea, vomiting, constipation  Rarely, numbness and tingling in your mouth or lips, lightheadedness, or anxiety may occur. Call your doctor right away if you think you may be experiencing any of these sensations, or if you have other questions regarding possible side effects.  Follow all  other discharge instructions given to you by your surgeon or nurse. Eat a healthy diet and drink plenty of water  or other fluids.About my Jackson-Pratt Bulb Drain  What is a Jackson-Pratt bulb? A Jackson-Pratt is a soft, round device used to collect drainage. It is connected to a long, thin drainage catheter, which is held in place by one or two small stiches near your surgical incision site. When the bulb is squeezed, it forms a vacuum, forcing the drainage to empty into the bulb.  Emptying the Jackson-Pratt bulb- To empty the bulb: 1. Release the plug on the top of the bulb. 2. Pour the bulb's contents into a measuring container which your nurse will provide. 3. Record the time emptied and amount of drainage. Empty the drain(s) as often as your     doctor or nurse recommends.  Date                  Time                    Amount (Drain 1)                 Amount (Drain 2)  _____________________________________________________________________  _____________________________________________________________________  _____________________________________________________________________  _____________________________________________________________________  _____________________________________________________________________  _____________________________________________________________________  _____________________________________________________________________  _____________________________________________________________________  Squeezing the Jackson-Pratt Bulb- To squeeze the bulb: 1. Make sure the plug at the top of the bulb is open. 2. Squeeze the bulb tightly in your fist. You will hear air squeezing from the bulb. 3. Replace the plug while the bulb is squeezed. 4. Use a safety pin to attach the bulb to your clothing. This will keep the catheter from     pulling at the bulb insertion site.  When to call your doctor- Call your doctor if: Drain site becomes red, swollen or  hot. You have a fever greater than 101 degrees F. There is oozing at the drain site. Drain falls out (apply a guaze bandage over the drain hole and secure it with tape). Drainage increases daily not related to activity patterns. (You will usually have more drainage when you are active than when you are resting.) Drainage has a bad odor.

## 2024-05-25 NOTE — Transfer of Care (Signed)
 Immediate Anesthesia Transfer of Care Note  Patient: [name]  Procedure(s) Performed: PANNICULECTOMY (Abdomen) REPAIR, ABDOMINAL WALL (Left: Abdomen)  Patient Location: PACU  Anesthesia Type:General  Level of Consciousness: awake, alert , and oriented  Airway & Oxygen Therapy: Patient Spontanous Breathing and Patient connected to face mask oxygen  Post-op Assessment: Report given to RN and Post -op Vital signs reviewed and stable  Post vital signs: Reviewed and stable  Last Vitals:  Vitals Value Taken Time  BP 119/74 05/25/24 11:13  Temp    Pulse    Resp 16 05/25/24 11:15  SpO2    Vitals shown include unfiled device data.  Last Pain:  Vitals:   05/25/24 0650  TempSrc: Temporal  PainSc: 0-No pain         Complications: No notable events documented.

## 2024-05-26 ENCOUNTER — Encounter (HOSPITAL_BASED_OUTPATIENT_CLINIC_OR_DEPARTMENT_OTHER): Payer: Self-pay | Admitting: Plastic Surgery

## 2024-05-26 ENCOUNTER — Encounter: Admitting: Student

## 2024-05-26 NOTE — Anesthesia Postprocedure Evaluation (Signed)
"   Anesthesia Post Note  Patient: [name]  Procedure(s) Performed: PANNICULECTOMY (Abdomen) REPAIR, ABDOMINAL WALL (Left: Abdomen)     Patient location during evaluation: PACU Anesthesia Type: General Level of consciousness: awake and alert Pain management: pain level controlled Vital Signs Assessment: post-procedure vital signs reviewed and stable Respiratory status: spontaneous breathing, nonlabored ventilation, respiratory function stable and patient connected to nasal cannula oxygen Cardiovascular status: blood pressure returned to baseline and stable Postop Assessment: no apparent nausea or vomiting Anesthetic complications: no   No notable events documented.  Last Vitals:  Vitals:   05/25/24 1225 05/25/24 1230  BP:  119/80  Pulse: 65 63  Resp: 16 15  Temp:  36.5 C  SpO2: 100% 100%    Last Pain:  Vitals:   05/25/24 1250  TempSrc:   PainSc: 6                  Epifanio Charleston E      "

## 2024-05-28 ENCOUNTER — Ambulatory Visit: Payer: Self-pay | Admitting: Family Medicine

## 2024-05-28 NOTE — Assessment & Plan Note (Signed)
 Chronic anemia with hemoglobin levels around 10 g/dL. Not severe enough to require transfusion.  -check CBC

## 2024-05-28 NOTE — Progress Notes (Signed)
 Blood count is normal. Clotting time is normal and urine culture is negative    Thanks!

## 2024-06-02 ENCOUNTER — Encounter: Admitting: Plastic Surgery

## 2024-06-02 ENCOUNTER — Emergency Department (HOSPITAL_COMMUNITY)
Admission: EM | Admit: 2024-06-02 | Discharge: 2024-06-02 | Disposition: A | Discharge status: Home / Self Care | Source: Ambulatory Visit

## 2024-06-02 ENCOUNTER — Ambulatory Visit: Admitting: Plastic Surgery

## 2024-06-02 ENCOUNTER — Encounter (HOSPITAL_COMMUNITY): Payer: Self-pay

## 2024-06-02 ENCOUNTER — Other Ambulatory Visit: Payer: Self-pay

## 2024-06-02 DIAGNOSIS — Z9889 Other specified postprocedural states: Secondary | ICD-10-CM

## 2024-06-02 DIAGNOSIS — R600 Localized edema: Secondary | ICD-10-CM | POA: Insufficient documentation

## 2024-06-02 MED ORDER — ENOXAPARIN SODIUM 120 MG/0.8ML IJ SOSY
1.5000 mg/kg | PREFILLED_SYRINGE | Freq: Once | INTRAMUSCULAR | Status: AC
  Administered 2024-06-02: 117 mg via SUBCUTANEOUS
  Filled 2024-06-02: qty 0.78

## 2024-06-02 NOTE — Discharge Instructions (Addendum)
 Please return to the emergency department tomorrow to have an ultrasound completed on your legs to rule out the presence of a blood clot, or DVT.  You were given the first dose of a blood thinner, Lovenox , in the emergency department this evening to prophylactically treat you in the event that you are positive for a blood clot.

## 2024-06-02 NOTE — ED Notes (Signed)
 Pt d/c home per EDP order. Discharge summary clarified with ED-PA Scott. Discharge summary reviewed, verbalize understanding. Ambulatory NAD.

## 2024-06-02 NOTE — ED Notes (Addendum)
 Pt to ED c/o lower extremity swelling x a couple days. Pt also reports right calf pain. Pt is 8 days post op from panni surgery. Abdominal binder in place. Today went for follow up apt, and was told to come to the ED to rule out blood clot. Pt currently denies SHOB.

## 2024-06-02 NOTE — ED Provider Triage Note (Signed)
 Emergency Medicine Provider Triage Evaluation Note  [name] , a 49 y.o. female  was evaluated in triage.  Pt complains of lower extremity edema.  Patient sent by her PCP for DVT rule out, had panniculectomy 8 days ago, notes bilateral lower extremity edema that is worse on the left leg with some tenderness in her right posterior calf.  No history of DVT or PE.  Not on blood thinners.  Review of Systems  Positive: As above Negative: As above  Physical Exam  BP 113/72 (BP Location: Left Arm)   Pulse 89   Temp 97.9 F (36.6 C) (Oral)   Resp 16   LMP 12/27/2003   SpO2 100%  Gen:   Awake, no distress   Resp:  Normal effort  MSK:   Moves extremities without difficulty  Other:  Trace to 1+ pitting edema of bilateral LE's L>R, R posterior calf is mildly tender  Medical Decision Making  Medically screening exam initiated at 6:45 PM.  Appropriate orders placed.  [name] was informed that the remainder of the evaluation will be completed by another provider, this initial triage assessment does not replace that evaluation, and the importance of remaining in the ED until their evaluation is complete.     [name], [place] 06/02/24 1846

## 2024-06-02 NOTE — ED Provider Notes (Signed)
 "  EMERGENCY DEPARTMENT AT [facility] Provider Note   CSN: [id number] Arrival date & time: 06/02/24  8182     Patient presents with: Leg Swelling (DVT R/O)   Rasa S Leth is a 49 y.o. female.   49 year old female sent by PCP for DVT rule out.    Had panniculectomy 8 days ago, notes bilateral lower extremity edema that is worse on the left leg with some tenderness in her right posterior calf.  No history of DVT or PE.  Not on blood thinners.  No redness/warmth of the lower extremities bilaterally.  No chest pain/shortness of breath.        Prior to Admission medications  Medication Sig Start Date End Date Taking? Authorizing Provider  acetaminophen  (TYLENOL ) 325 MG tablet Take 2 tablets (650 mg total) by mouth every 6 (six) hours as needed. 04/16/22   Rising, [place], PA-C  albuterol  (VENTOLIN  HFA) 108 (90 Base) MCG/ACT inhaler Inhale 1-2 puffs into the lungs every 6 (six) hours as needed for wheezing or shortness of breath. 08/15/22   Hazen Darryle BRAVO, FNP  calcium carbonate (OS-CAL - DOSED IN MG OF ELEMENTAL CALCIUM) 1250 (500 Ca) MG tablet Take 1 tablet by mouth daily with breakfast.    [provider]  cetirizine  (ZYRTEC  ALLERGY) 10 MG tablet Take 1 tablet (10 mg total) by mouth daily. 05/04/24   Christopher Savannah, PA-C  clonazePAM  (KLONOPIN ) 1 MG tablet Take 0.5-1 mg by mouth at bedtime. 01/01/22   [provider]  diclofenac  (VOLTAREN ) 75 MG EC tablet Take 75 mg by mouth 2 (two) times daily. 10/29/22   [provider]  EPINEPHrine  (EPIPEN  2-PAK) 0.3 mg/0.3 mL IJ SOAJ injection Inject 0.3 mg into the muscle as needed for anaphylaxis. 03/21/21   Marinda Rocky SAILOR, MD  ergocalciferol  (VITAMIN D2) 1.25 MG (50000 UT) capsule Take 50,000 Units by mouth once a week.    [provider]  esomeprazole  (NEXIUM ) 40 MG capsule Take 1 capsule (40 mg total) by mouth daily. 03/14/23   Armbruster, Elspeth SQUIBB, MD  estradiol (CLIMARA - DOSED IN MG/24 HR)  0.1 mg/24hr patch Place 0.1 mg onto the skin once a week. 01/07/23   [provider]  HYDROcodone -acetaminophen  (NORCO) 7.5-325 MG tablet Take 1 tablet by mouth every 8 (eight) hours as needed for moderate pain. 10/01/22   [provider]  HYDROcodone -acetaminophen  (NORCO/VICODIN) 5-325 MG tablet Take 0.5 tablets by mouth every 6 (six) hours as needed for up to 12 doses for moderate pain (pain score 4-6) or severe pain (pain score 7-10). 05/04/24   Andris Estefana BRAVO, PA-C  Multiple Vitamins-Minerals (BARIATRIC MULTIVITAMINS/IRON  PO) Take 1 capsule by mouth in the morning and at bedtime.    [provider]  ondansetron  (ZOFRAN ) 4 MG tablet Take 1 tablet (4 mg total) by mouth every 8 (eight) hours as needed for up to 15 doses for nausea or vomiting. 05/04/24   Andris Estefana BRAVO, PA-C  ondansetron  (ZOFRAN -ODT) 4 MG disintegrating tablet TAKE 1 TABLET BY MOUTH EVERY 8 HOURS AS NEEDED FOR NAUSEA FOR UP TO 7 DAYS    [provider]  promethazine -dextromethorphan (PROMETHAZINE -DM) 6.25-15 MG/5ML syrup Take 5 mLs by mouth 3 (three) times daily as needed for cough. 05/04/24   Christopher Savannah, PA-C  pseudoephedrine  (SUDAFED) 30 MG tablet Take 1 tablet (30 mg total) by mouth every 8 (eight) hours as needed for congestion. 05/04/24   Christopher Savannah, PA-C  RESTASIS 0.05 % ophthalmic emulsion Place 1 drop into  both eyes 2 (two) times daily as needed (dryness). 11/04/19   [provider]    Allergies: Ibuprofen , Aspirin , Bactrim  [sulfamethoxazole -trimethoprim ], Doxycycline , Salmon [fish allergy], and Zoledronic acid    Review of Systems  Updated Vital Signs  Vitals:   06/02/24 1834  BP: 113/72  Pulse: 89  Resp: 16  Temp: 97.9 F (36.6 C)  TempSrc: Oral  SpO2: 100%     Physical Exam Vitals and nursing note reviewed.  HENT:     Head: Normocephalic and atraumatic.  Eyes:     Extraocular Movements: Extraocular movements intact.     Pupils: Pupils are equal, round,  and reactive to light.  Cardiovascular:     Rate and Rhythm: Normal rate.  Pulmonary:     Effort: Pulmonary effort is normal.  Musculoskeletal:     Cervical back: Normal range of motion.     Right lower leg: Edema present.     Left lower leg: Edema present.     Comments: Trace - 1+ pitting edema of bilateral LE's L>R, no erythema/warmth of bilateral calves, mild tenderness on deep palpation of the R calf  Skin:    General: Skin is warm and dry.  Neurological:     General: No focal deficit present.     Mental Status: She is alert and oriented to person, place, and time.     (all labs ordered are listed, but only abnormal results are displayed) Labs Reviewed - No data to display  EKG: None  Radiology: No results found.   Procedures   Medications Ordered in the ED  enoxaparin  (LOVENOX ) injection 117 mg (has no administration in time range)                                    Medical Decision Making This patient presents to the ED for concern of bilateral lower extremity edema, this involves an extensive number of treatment options, and is a complaint that carries with it a high risk of complications and morbidity.  The differential diagnosis includes DVT, dependent edema, venous insufficiency, other postoperative complication   Co morbidities that complicate the patient evaluation  Recent panniculectomy 8 days ago   Imaging Studies ordered:  DVT ultrasound study of bilateral lower extremities ordered, unable to complete this as ultrasound staff had already left for the day   Cardiac Monitoring: / EKG:  The patient was maintained on a cardiac monitor.  I personally viewed and interpreted the cardiac monitored which showed an underlying rhythm of: NSR   Problem List / ED Course / Critical interventions / Medication management  I ordered medication including Lovenox  for DVT prophylaxis I have reviewed the patients home medicines and have made adjustments as  needed   Test / Admission - Considered:  Physical exam is notable as above, patient does have trace to 1+ pitting edema bilateral lower extremities, worse on the left lower extremity however present on both.  Patient does have some mild discomfort in the right calf with deep palpation, no erythema/warmth of the calves bilaterally.  DVT ultrasound study of bilateral lower extremities was ordered, as patient is high risk given recent surgery 8 days ago, unfortunately ultrasound staff had already left for the day and this could not be completed.  Patient given dose of Lovenox  in the emergency department today as DVT prophylaxis, with order to return tomorrow to have bilateral lower extremity ultrasound completed.  Patient voiced understanding  is in agreement this plan, she is appropriate for discharge at this time.  Staffed with Dr. Ula    Risk Prescription drug management.        Final diagnoses:  Bilateral lower extremity edema    ED Discharge Orders          Ordered    LE Venous       Comments: IMPORTANT PATIENT INSTRUCTIONS: You have been scheduled for an Outpatient Vascular Study at [facility].  If tomorrow is a Saturday, Sunday or holiday, please go to the [facility] Emergency Department Registration Desk at 11 am tomorrow morning and tell them you are there for a vascular study.  If tomorrow is a weekday (Monday-Friday), please go to the [facility] (address [address], [place]) at 8 am and report to the 4th floor registration Zone A.  Inform registration that you are there for a vascular study.   06/02/24 1948               Glendia Rocky SAILOR, PA-C 06/02/24 2016    Ula Prentice SAUNDERS, MD 06/02/24 2225  "

## 2024-06-02 NOTE — Progress Notes (Signed)
 Ms. Tye returns today 8 days postop from a panniculectomy.  She is doing extremely well though she did note that she started having swelling in her left leg and now has swelling in both legs.  She denies any pain in the calf and does not have any shortness of breath.  On examination the incisions are clean dry and intact the abdomen is soft and the output from her drains is decreasing appropriately.  The output is a thin serosanguineous fluid as expected.  Examination of the legs reveals more swelling on the left than the right.  She has pitting edema in both ankles.  There is no calf tenderness and there is no Homans' sign.  Status post panniculectomy: Doing very well with the exception of the lower extremity swelling. Out of an abundance of caution I have sent her to the emergency room for lower extremity Dopplers.  She will call the office when the study is done.  She will return either at her scheduled follow-up appointment or when her drains have been less than 30 mL/day for 2 consecutive days.  She may resume all preoperative medications including her phentermine

## 2024-06-03 ENCOUNTER — Ambulatory Visit (HOSPITAL_COMMUNITY)
Admission: RE | Admit: 2024-06-03 | Discharge: 2024-06-03 | Disposition: A | Discharge status: Home / Self Care | Source: Ambulatory Visit

## 2024-06-03 DIAGNOSIS — M7989 Other specified soft tissue disorders: Secondary | ICD-10-CM | POA: Insufficient documentation

## 2024-06-03 DIAGNOSIS — Z9889 Other specified postprocedural states: Secondary | ICD-10-CM | POA: Insufficient documentation

## 2024-06-03 DIAGNOSIS — R0602 Shortness of breath: Secondary | ICD-10-CM | POA: Diagnosis present

## 2024-06-08 ENCOUNTER — Ambulatory Visit: Payer: Self-pay | Admitting: Nurse Practitioner

## 2024-06-08 ENCOUNTER — Encounter: Payer: Self-pay | Admitting: Nurse Practitioner

## 2024-06-08 VITALS — BP 100/60 | HR 94 | Temp 98.0°F | Ht 64.0 in | Wt 173.6 lb

## 2024-06-08 DIAGNOSIS — Z0000 Encounter for general adult medical examination without abnormal findings: Secondary | ICD-10-CM

## 2024-06-08 DIAGNOSIS — J340 Abscess, furuncle and carbuncle of nose: Secondary | ICD-10-CM | POA: Insufficient documentation

## 2024-06-08 DIAGNOSIS — Z23 Encounter for immunization: Secondary | ICD-10-CM

## 2024-06-08 DIAGNOSIS — E559 Vitamin D deficiency, unspecified: Secondary | ICD-10-CM

## 2024-06-08 DIAGNOSIS — R7309 Other abnormal glucose: Secondary | ICD-10-CM

## 2024-06-08 DIAGNOSIS — Z79899 Other long term (current) drug therapy: Secondary | ICD-10-CM

## 2024-06-08 DIAGNOSIS — E663 Overweight: Secondary | ICD-10-CM

## 2024-06-08 DIAGNOSIS — K760 Fatty (change of) liver, not elsewhere classified: Secondary | ICD-10-CM

## 2024-06-08 DIAGNOSIS — J4540 Moderate persistent asthma, uncomplicated: Secondary | ICD-10-CM

## 2024-06-08 DIAGNOSIS — E782 Mixed hyperlipidemia: Secondary | ICD-10-CM

## 2024-06-08 MED ORDER — LORATADINE 10 MG PO TABS: 10.0000 mg | ORAL_TABLET | Freq: Every day | ORAL | Status: AC

## 2024-06-08 MED ORDER — MUPIROCIN 2 % EX OINT: 1.0000 | TOPICAL_OINTMENT | Freq: Every day | CUTANEOUS | 0 refills | Status: AC

## 2024-06-08 NOTE — Progress Notes (Unsigned)
 LILLETTE Kristeen JINNY Gladis, CMA,acting as a neurosurgeon for Sheila Ada, FNP.,have documented all relevant documentation on the behalf of Sheila Ada, FNP,as directed by  Sheila Ada, FNP while in the presence of Sheila Ada, FNP.  Subjective:    Patient ID: [name] , female    DOB: Oct 08, 1975 , 49 y.o.   MRN: [id number]  Chief Complaint  Patient presents with   Annual Exam    Patient presents today for HM, Patient reports compliance with medication. Patient denies any chest pain, SOB, or headaches.    Facial Pain    Patient reports she is having pain in her nose she reports it feeling like a sore but it has been present for a while. She reports using creams but they give her a headache.     HPI  Discussed the use of AI scribe software for clinical note transcription with the patient, who gave verbal consent to proceed.  History of Present Illness [name] is a 49 year old female who presents for an annual physical exam.  She has a persistent moist area inside her nose, present for a few weeks prior to her recent surgery. She uses various treatments, including NeilMed, which she believes may cause headaches. She experiences pressure in her nose but has no epistaxis. She frequently blows her nose due to constant rhinorrhea and has switched from Zyrtec  to Claritin  due to dryness. Claritin  is taken daily when remembered and helps with symptoms.  She recently underwent a hysterectomy and is following up with her surgeon. She has not been released to exercise yet but was previously walking two to three days a week. Her diet includes an effort to consume 80 grams of protein daily.  She mentions swelling in her feet and ankles, which she associates with a recent ultrasound. She was active and walking despite the swelling.  She has a history of seeing a urologist for a suspected bladder infection, for which she was prescribed antibiotics, but the issue did not resolve.  Past Medical History:   Diagnosis Date   Anemia    Anxiety    Apnea 12/08/2019   Arthritis    Asthma    Back pain    Exertional dyspnea 03/14/2020   Fatty liver    GERD (gastroesophageal reflux disease)    H/O: hysterectomy    Hx of endometriosis    Inappropriate sinus tachycardia 12/08/2019   Insomnia    Joint pain    Lactose intolerance    Migraine    Morbid obesity (HCC) 12/08/2019   Osteoporosis    Pneumonia    Pre-diabetes    Urticaria    Vitamin D  deficiency      Family History  Problem Relation Age of Onset   Diabetes Mother    Hypertension Mother    Hyperlipidemia Mother    Sleep apnea Mother    Thyroid  disease Sister    Dementia Father    Heart attack Maternal Grandmother    Stroke Maternal Grandfather    Asthma Son    Asthma Daughter    Colon cancer Neg Hx    Colon polyps Neg Hx    Esophageal cancer Neg Hx    Rectal cancer Neg Hx    Stomach cancer Neg Hx     Current Medications[1]   Allergies[2]    The patient states she uses status post hysterectomy for birth control. Patient's last menstrual period was 12/27/2003. Negative for: breast discharge, breast lump(s), breast pain and breast self exam.  Associated symptoms include abnormal vaginal bleeding. Pertinent negatives include abnormal bleeding (hematology), anxiety, decreased libido, depression, difficulty falling sleep, dyspareunia, history of infertility, nocturia, sexual dysfunction, sleep disturbances, urinary incontinence, urinary urgency, vaginal discharge and vaginal itching. Diet regular; she is trying to eat at least 80 grams of protein a day. The patient states her exercise level is minimal with walking - 2-3 days a week. She has not been cleared from her panniculectomy to exercise.   The patient's tobacco use is: Tobacco Use History[3]. She has been exposed to passive smoke. The patient's alcohol use is:  Social History   Substance and Sexual Activity  Alcohol Use Yes   Comment: occasional     Review of  Systems  Constitutional: Negative.   HENT: Negative.         She has a sore area on the inside of her nose on the left side that has been present for a few weeks to one month. She was using petroleum jelly and started using neosporin and began having headaches.   Eyes: Negative.   Respiratory:  Negative for cough.   Cardiovascular: Negative.   Gastrointestinal: Negative.   Endocrine: Negative.   Genitourinary: Negative.   Musculoskeletal: Negative.   Skin: Negative.   Allergic/Immunologic: Negative.   Neurological: Negative.   Hematological: Negative.   Psychiatric/Behavioral: Negative.       Today's Vitals   06/08/24 1407  BP: 100/60  Pulse: 94  Temp: 98 F (36.7 C)  TempSrc: Oral  Weight: 173 lb 9.6 oz (78.7 kg)  Height: 5' 4 (1.626 m)  PainSc: 0-No pain   Body mass index is 29.8 kg/m.  Wt Readings from Last 3 Encounters:  06/08/24 173 lb 9.6 oz (78.7 kg)  05/25/24 170 lb 3.1 oz (77.2 kg)  05/12/24 165 lb (74.8 kg)     Objective:  Physical Exam Vitals and nursing note reviewed.  Constitutional:      General: She is not in acute distress.    Appearance: Normal appearance. She is well-developed. She is obese.  HENT:     Head: Normocephalic and atraumatic.     Right Ear: Hearing, tympanic membrane, ear canal and external ear normal. There is no impacted cerumen.     Left Ear: Hearing, tympanic membrane, ear canal and external ear normal. There is no impacted cerumen.     Nose:     Comments: Open area to left nare    Mouth/Throat:     Mouth: Mucous membranes are moist.  Eyes:     General: Lids are normal.     Extraocular Movements: Extraocular movements intact.     Conjunctiva/sclera: Conjunctivae normal.     Pupils: Pupils are equal, round, and reactive to light.     Funduscopic exam:    Right eye: No papilledema.        Left eye: No papilledema.  Neck:     Thyroid : No thyroid  mass.     Vascular: No carotid bruit.  Cardiovascular:     Rate and Rhythm:  Normal rate and regular rhythm.     Pulses: Normal pulses.     Heart sounds: Normal heart sounds. No murmur heard. Pulmonary:     Effort: Pulmonary effort is normal. No respiratory distress.     Breath sounds: Normal breath sounds. No wheezing.  Chest:     Chest wall: No mass.  Breasts:    Tanner Score is 5.     Right: Normal. No mass or tenderness.  Left: Normal. No mass or tenderness.  Abdominal:     General: Abdomen is flat. Bowel sounds are normal. There is no distension.     Palpations: Abdomen is soft.     Tenderness: There is no abdominal tenderness.  Genitourinary:    Comments: Deferred Musculoskeletal:        General: No swelling or tenderness. Normal range of motion.     Cervical back: Full passive range of motion without pain, normal range of motion and neck supple.     Right lower leg: No edema.     Left lower leg: No edema.  Lymphadenopathy:     Upper Body:     Right upper body: No supraclavicular, axillary or pectoral adenopathy.     Left upper body: No supraclavicular, axillary or pectoral adenopathy.  Skin:    General: Skin is warm and dry.     Capillary Refill: Capillary refill takes less than 2 seconds.  Neurological:     General: No focal deficit present.     Mental Status: She is alert and oriented to person, place, and time.     Cranial Nerves: No cranial nerve deficit.     Sensory: No sensory deficit.     Motor: No weakness.  Psychiatric:        Mood and Affect: Mood normal.        Behavior: Behavior normal.        Thought Content: Thought content normal.        Judgment: Judgment normal.        Assessment And Plan:     Assessment & Plan Encounter for annual health examination     Moderate persistent asthma without complication  Orders:   CMP14+EGFR  Fatty liver  Orders:   CMP14+EGFR  Vitamin D  deficiency  Orders:   VITAMIN D  25 Hydroxy (Vit-D Deficiency, Fractures)  Mixed hyperlipidemia  Orders:   Lipid panel  Overweight  with body mass index (BMI) of 29 to 29.9 in adult     Other long term (current) drug therapy  Orders:   CBC with Differential/Platelet  Abnormal glucose  Orders:   Hemoglobin A1c  Nasal ulcer  Orders:   mupirocin  ointment (BACTROBAN ) 2 %; Apply 1 Application topically daily.    Assessment & Plan Nasal ulcer Persistent nasal ulcer of uncertain cause. - Prescribed Bactroban  ointment once daily. - Advised to avoid manipulating the ulcer.  Allergic rhinitis Chronic rhinorrhea managed with loratadine . - Continue loratadine  daily.  Overweight Weight management ongoing with specialist. Currently not exercising post-surgery.  Return for 1 year physical, 6 month chol check. Patient was given opportunity to ask questions. Patient verbalized understanding of the plan and was able to repeat key elements of the plan. All questions were answered to their satisfaction.   Sheila Ada, FNP  I, Sheila Ada, FNP, have reviewed all documentation for this visit. The documentation on 06/08/24 for the exam, diagnosis, procedures, and orders are all accurate and complete.      [1]  Current Outpatient Medications:    acetaminophen  (TYLENOL ) 325 MG tablet, Take 2 tablets (650 mg total) by mouth every 6 (six) hours as needed., Disp: 30 tablet, Rfl: 0   albuterol  (VENTOLIN  HFA) 108 (90 Base) MCG/ACT inhaler, Inhale 1-2 puffs into the lungs every 6 (six) hours as needed for wheezing or shortness of breath., Disp: 1 each, Rfl: 0   calcium carbonate (OS-CAL - DOSED IN MG OF ELEMENTAL CALCIUM) 1250 (500 Ca) MG tablet, Take 1 tablet by mouth  daily with breakfast., Disp: , Rfl:    clonazePAM  (KLONOPIN ) 1 MG tablet, Take 0.5-1 mg by mouth at bedtime., Disp: , Rfl:    diclofenac  (VOLTAREN ) 75 MG EC tablet, Take 75 mg by mouth 2 (two) times daily., Disp: , Rfl:    EPINEPHrine  (EPIPEN  2-PAK) 0.3 mg/0.3 mL IJ SOAJ injection, Inject 0.3 mg into the muscle as needed for anaphylaxis., Disp: 2 each, Rfl: 2    ergocalciferol  (VITAMIN D2) 1.25 MG (50000 UT) capsule, Take 50,000 Units by mouth once a week., Disp: , Rfl:    esomeprazole  (NEXIUM ) 40 MG capsule, Take 1 capsule (40 mg total) by mouth daily., Disp: 90 capsule, Rfl: 3   estradiol (CLIMARA - DOSED IN MG/24 HR) 0.1 mg/24hr patch, Place 0.1 mg onto the skin once a week., Disp: , Rfl:    HYDROcodone -acetaminophen  (NORCO) 7.5-325 MG tablet, Take 1 tablet by mouth every 8 (eight) hours as needed for moderate pain., Disp: , Rfl:    HYDROcodone -acetaminophen  (NORCO/VICODIN) 5-325 MG tablet, Take 0.5 tablets by mouth every 6 (six) hours as needed for up to 12 doses for moderate pain (pain score 4-6) or severe pain (pain score 7-10)., Disp: 6 tablet, Rfl: 0   loratadine  (CLARITIN ) 10 MG tablet, Take 1 tablet (10 mg total) by mouth daily., Disp: , Rfl:    Multiple Vitamins-Minerals (BARIATRIC MULTIVITAMINS/IRON  PO), Take 1 capsule by mouth in the morning and at bedtime., Disp: , Rfl:    mupirocin  ointment (BACTROBAN ) 2 %, Apply 1 Application topically daily., Disp: 15 g, Rfl: 0   ondansetron  (ZOFRAN ) 4 MG tablet, Take 1 tablet (4 mg total) by mouth every 8 (eight) hours as needed for up to 15 doses for nausea or vomiting., Disp: 15 tablet, Rfl: 0   ondansetron  (ZOFRAN -ODT) 4 MG disintegrating tablet, TAKE 1 TABLET BY MOUTH EVERY 8 HOURS AS NEEDED FOR NAUSEA FOR UP TO 7 DAYS, Disp: , Rfl:    promethazine -dextromethorphan (PROMETHAZINE -DM) 6.25-15 MG/5ML syrup, Take 5 mLs by mouth 3 (three) times daily as needed for cough., Disp: 200 mL, Rfl: 0   pseudoephedrine  (SUDAFED) 30 MG tablet, Take 1 tablet (30 mg total) by mouth every 8 (eight) hours as needed for congestion., Disp: 30 tablet, Rfl: 0   RESTASIS 0.05 % ophthalmic emulsion, Place 1 drop into both eyes 2 (two) times daily as needed (dryness)., Disp: , Rfl:  [2]  Allergies Allergen Reactions   Ibuprofen  Other (See Comments)    MD advised pt to not take med- Gastric Bypass!!    Aspirin  Other (See  Comments)    MD advised pt to not take med   Bactrim  [Sulfamethoxazole -Trimethoprim ]    Doxycycline     Salmon [Fish Allergy] Swelling   Zoledronic Acid Other (See Comments)    Muscle aches  [3]  Social History Tobacco Use  Smoking Status Never  Smokeless Tobacco Never

## 2024-06-08 NOTE — Assessment & Plan Note (Addendum)
  Orders:   CMP14+EGFR

## 2024-06-08 NOTE — Assessment & Plan Note (Addendum)
  Orders:   VITAMIN D  25 Hydroxy (Vit-D Deficiency, Fractures)

## 2024-06-08 NOTE — Assessment & Plan Note (Signed)
 Orders:    Lipid panel

## 2024-06-08 NOTE — Assessment & Plan Note (Signed)
 Orders:    Hemoglobin A1c

## 2024-06-08 NOTE — Assessment & Plan Note (Addendum)
 [name]

## 2024-06-08 NOTE — Patient Instructions (Signed)
 Health Maintenance  Topic Date Due   Hepatitis B Vaccine (1 of 3 - 19+ 3-dose series) Never done   COVID-19 Vaccine (1) 06/24/2024*   Flu Shot  08/03/2024*   Pneumococcal Vaccine (1 of 2 - PCV) 01/28/2025*   Breast Cancer Screening  01/11/2025   DTaP/Tdap/Td vaccine (3 - Td or Tdap) 12/05/2025   Colon Cancer Screening  02/21/2027   HPV Vaccine (No Doses Required) Completed   Hepatitis C Screening  Completed   HIV Screening  Completed   Meningitis B Vaccine  Aged Out  *Topic was postponed. The date shown is not the original due date.

## 2024-06-09 LAB — CMP14+EGFR
ALT: 52 [IU]/L — ABNORMAL HIGH (ref 0–32)
AST: 39 [IU]/L (ref 0–40)
Albumin: 4.4 g/dL (ref 3.9–4.9)
Alkaline Phosphatase: 62 [IU]/L (ref 41–116)
BUN/Creatinine Ratio: 26 — ABNORMAL HIGH (ref 9–23)
BUN: 14 mg/dL (ref 6–24)
Bilirubin Total: 0.2 mg/dL (ref 0.0–1.2)
CO2: 24 mmol/L (ref 20–29)
Calcium: 9.2 mg/dL (ref 8.7–10.2)
Chloride: 101 mmol/L (ref 96–106)
Creatinine, Ser: 0.53 mg/dL — ABNORMAL LOW (ref 0.57–1.00)
Globulin, Total: 2.5 g/dL (ref 1.5–4.5)
Glucose: 74 mg/dL (ref 70–99)
Potassium: 4.1 mmol/L (ref 3.5–5.2)
Sodium: 140 mmol/L (ref 134–144)
Total Protein: 6.9 g/dL (ref 6.0–8.5)
eGFR: 114 mL/min/{1.73_m2}

## 2024-06-09 LAB — CBC WITH DIFFERENTIAL/PLATELET
Basophils Absolute: 0 10*3/uL (ref 0.0–0.2)
Basos: 1 %
EOS (ABSOLUTE): 0.2 10*3/uL (ref 0.0–0.4)
Eos: 3 %
Hematocrit: 35.3 % (ref 34.0–46.6)
Hemoglobin: 11 g/dL — ABNORMAL LOW (ref 11.1–15.9)
Immature Grans (Abs): 0 10*3/uL (ref 0.0–0.1)
Immature Granulocytes: 0 %
Lymphocytes Absolute: 1.9 10*3/uL (ref 0.7–3.1)
Lymphs: 34 %
MCH: 26.1 pg — ABNORMAL LOW (ref 26.6–33.0)
MCHC: 31.2 g/dL — ABNORMAL LOW (ref 31.5–35.7)
MCV: 84 fL (ref 79–97)
Monocytes Absolute: 0.4 10*3/uL (ref 0.1–0.9)
Monocytes: 6 %
Neutrophils Absolute: 3.1 10*3/uL (ref 1.4–7.0)
Neutrophils: 56 %
Platelets: 313 10*3/uL (ref 150–450)
RBC: 4.21 x10E6/uL (ref 3.77–5.28)
RDW: 12.7 % (ref 11.7–15.4)
WBC: 5.6 10*3/uL (ref 3.4–10.8)

## 2024-06-09 LAB — LIPID PANEL
Chol/HDL Ratio: 2.5 ratio (ref 0.0–4.4)
Cholesterol, Total: 186 mg/dL (ref 100–199)
HDL: 74 mg/dL
LDL Chol Calc (NIH): 101 mg/dL — ABNORMAL HIGH (ref 0–99)
Triglycerides: 59 mg/dL (ref 0–149)
VLDL Cholesterol Cal: 11 mg/dL (ref 5–40)

## 2024-06-09 LAB — VITAMIN D 25 HYDROXY (VIT D DEFICIENCY, FRACTURES): Vit D, 25-Hydroxy: 86 ng/mL (ref 30.0–100.0)

## 2024-06-09 LAB — HEMOGLOBIN A1C
Est. average glucose Bld gHb Est-mCnc: 108 mg/dL
Hgb A1c MFr Bld: 5.4 % (ref 4.8–5.6)

## 2024-06-16 ENCOUNTER — Encounter: Admitting: Student

## 2024-06-22 ENCOUNTER — Encounter: Admitting: Dietician

## 2024-07-22 ENCOUNTER — Encounter: Admitting: Plastic Surgery

## 2024-12-06 ENCOUNTER — Ambulatory Visit: Payer: Self-pay | Admitting: Nurse Practitioner

## 2024-12-20 ENCOUNTER — Ambulatory Visit: Admitting: Hematology

## 2024-12-20 ENCOUNTER — Other Ambulatory Visit

## 2025-06-13 ENCOUNTER — Encounter: Payer: Self-pay | Admitting: Nurse Practitioner
